# Patient Record
Sex: Male | Born: 1950 | Race: White | Hispanic: No | Smoking: Former smoker
Health system: Southern US, Community
[De-identification: ages and names within clinical notes are randomized; demographics above are authoritative.]

## PROBLEM LIST (undated history)

## (undated) DIAGNOSIS — F32A Depression, unspecified: Secondary | ICD-10-CM

## (undated) DIAGNOSIS — I219 Acute myocardial infarction, unspecified: Secondary | ICD-10-CM

## (undated) DIAGNOSIS — G8929 Other chronic pain: Secondary | ICD-10-CM

## (undated) DIAGNOSIS — I2510 Atherosclerotic heart disease of native coronary artery without angina pectoris: Secondary | ICD-10-CM

## (undated) DIAGNOSIS — Z9889 Other specified postprocedural states: Secondary | ICD-10-CM

## (undated) DIAGNOSIS — M549 Dorsalgia, unspecified: Secondary | ICD-10-CM

## (undated) DIAGNOSIS — F329 Major depressive disorder, single episode, unspecified: Secondary | ICD-10-CM

## (undated) DIAGNOSIS — N400 Enlarged prostate without lower urinary tract symptoms: Secondary | ICD-10-CM

## (undated) DIAGNOSIS — N390 Urinary tract infection, site not specified: Secondary | ICD-10-CM

## (undated) DIAGNOSIS — K219 Gastro-esophageal reflux disease without esophagitis: Secondary | ICD-10-CM

## (undated) DIAGNOSIS — T7840XA Allergy, unspecified, initial encounter: Secondary | ICD-10-CM

## (undated) DIAGNOSIS — I509 Heart failure, unspecified: Secondary | ICD-10-CM

## (undated) DIAGNOSIS — E119 Type 2 diabetes mellitus without complications: Secondary | ICD-10-CM

## (undated) DIAGNOSIS — I639 Cerebral infarction, unspecified: Secondary | ICD-10-CM

## (undated) DIAGNOSIS — I10 Essential (primary) hypertension: Secondary | ICD-10-CM

## (undated) DIAGNOSIS — C4491 Basal cell carcinoma of skin, unspecified: Secondary | ICD-10-CM

## (undated) DIAGNOSIS — Z9689 Presence of other specified functional implants: Secondary | ICD-10-CM

## (undated) DIAGNOSIS — M869 Osteomyelitis, unspecified: Secondary | ICD-10-CM

## (undated) DIAGNOSIS — G4733 Obstructive sleep apnea (adult) (pediatric): Secondary | ICD-10-CM

## (undated) HISTORY — DX: Cerebral infarction, unspecified: I63.9

## (undated) HISTORY — DX: Other specified postprocedural states: Z98.890

## (undated) HISTORY — DX: Basal cell carcinoma of skin, unspecified: C44.91

## (undated) HISTORY — DX: Gastro-esophageal reflux disease without esophagitis: K21.9

## (undated) HISTORY — PX: OTHER SURGICAL HISTORY: SHX169

## (undated) HISTORY — DX: Allergy, unspecified, initial encounter: T78.40XA

## (undated) HISTORY — DX: Presence of other specified functional implants: Z96.89

## (undated) HISTORY — DX: Acute myocardial infarction, unspecified: I21.9

## (undated) SURGERY — INSERTION, CARDIAC PACEMAKER
Anesthesia: General | Laterality: Left

---

## 2000-04-05 ENCOUNTER — Inpatient Hospital Stay (HOSPITAL_COMMUNITY): Admission: EM | Admit: 2000-04-05 | Discharge: 2000-04-07 | Payer: Self-pay | Admitting: Emergency Medicine

## 2000-04-05 ENCOUNTER — Encounter: Payer: Self-pay | Admitting: Emergency Medicine

## 2007-03-17 ENCOUNTER — Inpatient Hospital Stay: Payer: Self-pay | Admitting: Internal Medicine

## 2007-03-17 ENCOUNTER — Other Ambulatory Visit: Payer: Self-pay

## 2007-03-21 ENCOUNTER — Other Ambulatory Visit: Payer: Self-pay

## 2007-05-18 ENCOUNTER — Ambulatory Visit: Payer: Self-pay | Admitting: Sports Medicine

## 2007-05-24 ENCOUNTER — Ambulatory Visit: Payer: Self-pay | Admitting: Sports Medicine

## 2007-06-04 ENCOUNTER — Ambulatory Visit: Payer: Self-pay | Admitting: Gastroenterology

## 2007-06-08 ENCOUNTER — Other Ambulatory Visit: Payer: Self-pay

## 2007-06-08 ENCOUNTER — Ambulatory Visit: Payer: Self-pay | Admitting: Orthopaedic Surgery

## 2007-06-19 ENCOUNTER — Ambulatory Visit: Payer: Self-pay | Admitting: Orthopaedic Surgery

## 2007-09-04 ENCOUNTER — Ambulatory Visit: Payer: Self-pay | Admitting: Orthopaedic Surgery

## 2007-09-11 ENCOUNTER — Ambulatory Visit: Payer: Self-pay | Admitting: Orthopaedic Surgery

## 2008-08-04 ENCOUNTER — Ambulatory Visit: Payer: Self-pay | Admitting: Urology

## 2008-09-08 ENCOUNTER — Ambulatory Visit: Payer: Self-pay | Admitting: Urology

## 2011-08-22 DIAGNOSIS — F1190 Opioid use, unspecified, uncomplicated: Secondary | ICD-10-CM | POA: Insufficient documentation

## 2011-09-29 ENCOUNTER — Ambulatory Visit: Payer: Self-pay | Admitting: Internal Medicine

## 2012-01-18 DIAGNOSIS — M79671 Pain in right foot: Secondary | ICD-10-CM | POA: Insufficient documentation

## 2012-02-13 DIAGNOSIS — N312 Flaccid neuropathic bladder, not elsewhere classified: Secondary | ICD-10-CM | POA: Insufficient documentation

## 2012-02-13 DIAGNOSIS — N401 Enlarged prostate with lower urinary tract symptoms: Secondary | ICD-10-CM | POA: Insufficient documentation

## 2012-02-13 DIAGNOSIS — Z87442 Personal history of urinary calculi: Secondary | ICD-10-CM | POA: Insufficient documentation

## 2012-02-13 DIAGNOSIS — N478 Other disorders of prepuce: Secondary | ICD-10-CM | POA: Insufficient documentation

## 2012-02-13 DIAGNOSIS — N476 Balanoposthitis: Secondary | ICD-10-CM | POA: Insufficient documentation

## 2012-02-13 DIAGNOSIS — N3941 Urge incontinence: Secondary | ICD-10-CM | POA: Insufficient documentation

## 2012-03-16 DIAGNOSIS — N390 Urinary tract infection, site not specified: Secondary | ICD-10-CM | POA: Insufficient documentation

## 2013-03-15 ENCOUNTER — Ambulatory Visit: Payer: Self-pay | Admitting: Medical

## 2013-03-29 ENCOUNTER — Ambulatory Visit: Payer: Self-pay | Admitting: Unknown Physician Specialty

## 2013-04-03 DIAGNOSIS — M25511 Pain in right shoulder: Secondary | ICD-10-CM | POA: Insufficient documentation

## 2013-05-06 ENCOUNTER — Ambulatory Visit: Payer: Self-pay | Admitting: Unknown Physician Specialty

## 2013-05-06 DIAGNOSIS — I10 Essential (primary) hypertension: Secondary | ICD-10-CM

## 2013-05-06 LAB — CBC WITH DIFFERENTIAL/PLATELET
Basophil #: 0.1 10*3/uL (ref 0.0–0.1)
Basophil %: 1.4 %
Eosinophil #: 0.2 10*3/uL (ref 0.0–0.7)
Eosinophil %: 3.5 %
HCT: 40.4 % (ref 40.0–52.0)
HGB: 13.1 g/dL (ref 13.0–18.0)
Lymphocyte #: 2.1 10*3/uL (ref 1.0–3.6)
Lymphocyte %: 35.3 %
MCH: 27.1 pg (ref 26.0–34.0)
MCHC: 32.5 g/dL (ref 32.0–36.0)
MCV: 83 fL (ref 80–100)
Monocyte #: 0.5 x10 3/mm (ref 0.2–1.0)
Monocyte %: 8.3 %
Neutrophil #: 3 10*3/uL (ref 1.4–6.5)
Neutrophil %: 51.5 %
Platelet: 203 10*3/uL (ref 150–440)
RBC: 4.84 10*6/uL (ref 4.40–5.90)
RDW: 15.8 % — ABNORMAL HIGH (ref 11.5–14.5)
WBC: 5.9 10*3/uL (ref 3.8–10.6)

## 2013-05-06 LAB — BASIC METABOLIC PANEL
Anion Gap: 4 — ABNORMAL LOW (ref 7–16)
BUN: 22 mg/dL — ABNORMAL HIGH (ref 7–18)
Calcium, Total: 9.7 mg/dL (ref 8.5–10.1)
EGFR (African American): >60
EGFR (Non-African Amer.): >60
Glucose: 103 mg/dL — ABNORMAL HIGH (ref 65–99)
Osmolality: 283 (ref 275–301)
Potassium: 4.4 mmol/L (ref 3.5–5.1)
Sodium: 140 mmol/L (ref 136–145)

## 2013-05-17 ENCOUNTER — Ambulatory Visit: Payer: Self-pay | Admitting: Unknown Physician Specialty

## 2013-08-21 DIAGNOSIS — M5416 Radiculopathy, lumbar region: Secondary | ICD-10-CM

## 2013-08-21 DIAGNOSIS — G8929 Other chronic pain: Secondary | ICD-10-CM | POA: Insufficient documentation

## 2013-08-29 LAB — BASIC METABOLIC PANEL
Anion Gap: 6 — ABNORMAL LOW (ref 7–16)
BUN: 19 mg/dL — ABNORMAL HIGH (ref 7–18)
CHLORIDE: 106 mmol/L (ref 98–107)
Calcium, Total: 9 mg/dL (ref 8.5–10.1)
Co2: 27 mmol/L (ref 21–32)
Creatinine: 0.83 mg/dL (ref 0.60–1.30)
EGFR (African American): >60
GLUCOSE: 211 mg/dL — AB (ref 65–99)
Osmolality: 286 (ref 275–301)
Potassium: 4.6 mmol/L (ref 3.5–5.1)
Sodium: 139 mmol/L (ref 136–145)

## 2013-08-29 LAB — CBC WITH DIFFERENTIAL/PLATELET
Basophil #: 0.1 10*3/uL (ref 0.0–0.1)
Basophil %: 1.5 %
EOS ABS: 0.2 10*3/uL (ref 0.0–0.7)
EOS PCT: 2.4 %
HCT: 44.2 % (ref 40.0–52.0)
HGB: 14.2 g/dL (ref 13.0–18.0)
LYMPHS PCT: 23.2 %
Lymphocyte #: 1.7 10*3/uL (ref 1.0–3.6)
MCH: 26.4 pg (ref 26.0–34.0)
MCHC: 32.1 g/dL (ref 32.0–36.0)
MCV: 82 fL (ref 80–100)
MONO ABS: 0.4 x10 3/mm (ref 0.2–1.0)
Monocyte %: 5.8 %
NEUTROS ABS: 5 10*3/uL (ref 1.4–6.5)
NEUTROS PCT: 67.1 %
PLATELETS: 218 10*3/uL (ref 150–440)
RBC: 5.37 10*6/uL (ref 4.40–5.90)
RDW: 15.6 % — AB (ref 11.5–14.5)
WBC: 7.4 10*3/uL (ref 3.8–10.6)

## 2013-08-29 LAB — DRUG SCREEN, URINE
AMPHETAMINES, UR SCREEN: NEGATIVE (ref ?–1000)
BENZODIAZEPINE, UR SCRN: NEGATIVE (ref ?–200)
Barbiturates, Ur Screen: NEGATIVE (ref ?–200)
CANNABINOID 50 NG, UR ~~LOC~~: NEGATIVE (ref ?–50)
Cocaine Metabolite,Ur ~~LOC~~: NEGATIVE (ref ?–300)
MDMA (ECSTASY) UR SCREEN: NEGATIVE (ref ?–500)
Methadone, Ur Screen: NEGATIVE (ref ?–300)
Opiate, Ur Screen: NEGATIVE (ref ?–300)
Phencyclidine (PCP) Ur S: NEGATIVE (ref ?–25)
TRICYCLIC, UR SCREEN: NEGATIVE (ref ?–1000)

## 2013-08-29 LAB — URINALYSIS, COMPLETE
Bilirubin,UR: NEGATIVE
Blood: NEGATIVE
GLUCOSE, UR: NEGATIVE mg/dL (ref 0–75)
Leukocyte Esterase: NEGATIVE
Nitrite: NEGATIVE
PROTEIN: NEGATIVE
Ph: 8 (ref 4.5–8.0)
RBC,UR: NONE SEEN /HPF (ref 0–5)
Specific Gravity: 1.009 (ref 1.003–1.030)
Squamous Epithelial: <1
WBC UR: 1 /HPF (ref 0–5)

## 2013-08-29 LAB — AMMONIA: Ammonia, Plasma: 26 mcmol/L (ref 11–32)

## 2013-08-29 LAB — ETHANOL: Ethanol: <3 mg/dL

## 2013-08-29 LAB — TROPONIN I: Troponin-I: <0.02 ng/mL

## 2013-08-30 ENCOUNTER — Observation Stay: Payer: Self-pay | Admitting: Internal Medicine

## 2013-08-30 LAB — BASIC METABOLIC PANEL
Anion Gap: 8 (ref 7–16)
BUN: 14 mg/dL (ref 7–18)
CHLORIDE: 106 mmol/L (ref 98–107)
Calcium, Total: 9.2 mg/dL (ref 8.5–10.1)
Co2: 26 mmol/L (ref 21–32)
Creatinine: 0.76 mg/dL (ref 0.60–1.30)
EGFR (African American): >60
Glucose: 225 mg/dL — ABNORMAL HIGH (ref 65–99)
OSMOLALITY: 287 (ref 275–301)
Potassium: 3.8 mmol/L (ref 3.5–5.1)
Sodium: 140 mmol/L (ref 136–145)

## 2013-08-30 LAB — LIPID PANEL
CHOLESTEROL: 180 mg/dL (ref 0–200)
HDL Cholesterol: 27 mg/dL — ABNORMAL LOW (ref 40–60)
TRIGLYCERIDES: 465 mg/dL — AB (ref 0–200)

## 2013-08-30 LAB — CBC WITH DIFFERENTIAL/PLATELET
BASOS ABS: 0.1 10*3/uL (ref 0.0–0.1)
BASOS PCT: 1.2 %
Eosinophil #: 0.1 10*3/uL (ref 0.0–0.7)
Eosinophil %: 1.4 %
HCT: 43.1 % (ref 40.0–52.0)
HGB: 14 g/dL (ref 13.0–18.0)
LYMPHS ABS: 1.6 10*3/uL (ref 1.0–3.6)
Lymphocyte %: 22 %
MCH: 26.4 pg (ref 26.0–34.0)
MCHC: 32.5 g/dL (ref 32.0–36.0)
MCV: 81 fL (ref 80–100)
MONO ABS: 0.4 x10 3/mm (ref 0.2–1.0)
Monocyte %: 6.1 %
NEUTROS ABS: 5 10*3/uL (ref 1.4–6.5)
NEUTROS PCT: 69.3 %
Platelet: 222 10*3/uL (ref 150–440)
RBC: 5.31 10*6/uL (ref 4.40–5.90)
RDW: 15.7 % — ABNORMAL HIGH (ref 11.5–14.5)
WBC: 7.3 10*3/uL (ref 3.8–10.6)

## 2013-08-30 LAB — TSH: Thyroid Stimulating Horm: 2.36 u[IU]/mL

## 2013-08-30 LAB — SEDIMENTATION RATE: Erythrocyte Sed Rate: 14 mm/hr (ref 0–20)

## 2013-08-31 ENCOUNTER — Ambulatory Visit: Payer: Self-pay | Admitting: Neurology

## 2013-08-31 LAB — CK: CK, TOTAL: 137 U/L

## 2013-09-03 ENCOUNTER — Observation Stay: Payer: Self-pay | Admitting: Internal Medicine

## 2013-09-03 LAB — URINALYSIS, COMPLETE
Bilirubin,UR: NEGATIVE
Blood: NEGATIVE
Glucose,UR: NEGATIVE mg/dL (ref 0–75)
KETONE: NEGATIVE
NITRITE: NEGATIVE
Ph: 5 (ref 4.5–8.0)
Protein: NEGATIVE
RBC,UR: <1 /HPF (ref 0–5)
Specific Gravity: 1.024 (ref 1.003–1.030)
WBC UR: 4 /HPF (ref 0–5)

## 2013-09-03 LAB — COMPREHENSIVE METABOLIC PANEL
ALBUMIN: 3.9 g/dL (ref 3.4–5.0)
ANION GAP: 5 — AB (ref 7–16)
AST: 21 U/L (ref 15–37)
Alkaline Phosphatase: 95 U/L
BUN: 27 mg/dL — ABNORMAL HIGH (ref 7–18)
Bilirubin,Total: 0.4 mg/dL (ref 0.2–1.0)
CHLORIDE: 106 mmol/L (ref 98–107)
CO2: 27 mmol/L (ref 21–32)
CREATININE: 0.86 mg/dL (ref 0.60–1.30)
Calcium, Total: 9.6 mg/dL (ref 8.5–10.1)
EGFR (African American): >60
EGFR (Non-African Amer.): >60
GLUCOSE: 118 mg/dL — AB (ref 65–99)
Osmolality: 282 (ref 275–301)
Potassium: 3.9 mmol/L (ref 3.5–5.1)
SGPT (ALT): 27 U/L (ref 12–78)
Sodium: 138 mmol/L (ref 136–145)
Total Protein: 7.9 g/dL (ref 6.4–8.2)

## 2013-09-03 LAB — CBC
HCT: 42.6 % (ref 40.0–52.0)
HGB: 13.6 g/dL (ref 13.0–18.0)
MCH: 26.1 pg (ref 26.0–34.0)
MCHC: 31.9 g/dL — AB (ref 32.0–36.0)
MCV: 82 fL (ref 80–100)
Platelet: 232 10*3/uL (ref 150–440)
RBC: 5.22 10*6/uL (ref 4.40–5.90)
RDW: 15.8 % — AB (ref 11.5–14.5)
WBC: 6.4 10*3/uL (ref 3.8–10.6)

## 2013-09-03 LAB — CK TOTAL AND CKMB (NOT AT ARMC)
CK, TOTAL: 65 U/L
CK-MB: 1.3 ng/mL (ref 0.5–3.6)

## 2013-09-03 LAB — TROPONIN I

## 2013-09-04 LAB — CBC WITH DIFFERENTIAL/PLATELET
BASOS PCT: 3.2 %
Basophil #: 0.1 10*3/uL (ref 0.0–0.1)
EOS ABS: 0.2 10*3/uL (ref 0.0–0.7)
EOS PCT: 5.8 %
HCT: 40.4 % (ref 40.0–52.0)
HGB: 13.1 g/dL (ref 13.0–18.0)
Lymphocyte #: 2.2 10*3/uL (ref 1.0–3.6)
Lymphocyte %: 51.1 %
MCH: 26.4 pg (ref 26.0–34.0)
MCHC: 32.3 g/dL (ref 32.0–36.0)
MCV: 82 fL (ref 80–100)
MONOS PCT: 8.1 %
Monocyte #: 0.3 x10 3/mm (ref 0.2–1.0)
NEUTROS ABS: 1.3 10*3/uL — AB (ref 1.4–6.5)
NEUTROS PCT: 31.8 %
Platelet: 197 10*3/uL (ref 150–440)
RBC: 4.94 10*6/uL (ref 4.40–5.90)
RDW: 16.2 % — ABNORMAL HIGH (ref 11.5–14.5)
WBC: 4.2 10*3/uL (ref 3.8–10.6)

## 2013-09-04 LAB — BASIC METABOLIC PANEL
Anion Gap: 6 — ABNORMAL LOW (ref 7–16)
BUN: 26 mg/dL — AB (ref 7–18)
CHLORIDE: 110 mmol/L — AB (ref 98–107)
CO2: 27 mmol/L (ref 21–32)
CREATININE: 0.86 mg/dL (ref 0.60–1.30)
Calcium, Total: 8.8 mg/dL (ref 8.5–10.1)
EGFR (Non-African Amer.): >60
GLUCOSE: 131 mg/dL — AB (ref 65–99)
OSMOLALITY: 292 (ref 275–301)
Potassium: 3.7 mmol/L (ref 3.5–5.1)
Sodium: 143 mmol/L (ref 136–145)

## 2013-11-08 DIAGNOSIS — E119 Type 2 diabetes mellitus without complications: Secondary | ICD-10-CM | POA: Insufficient documentation

## 2013-11-08 DIAGNOSIS — E1169 Type 2 diabetes mellitus with other specified complication: Secondary | ICD-10-CM | POA: Insufficient documentation

## 2013-11-25 ENCOUNTER — Emergency Department: Payer: Self-pay | Admitting: Emergency Medicine

## 2013-11-25 LAB — CBC WITH DIFFERENTIAL/PLATELET
Basophil #: 0.1 x10 3/mm 3 (ref 0.0–0.1)
Basophil %: 1.3 %
Eosinophil #: 0.2 x10 3/mm 3 (ref 0.0–0.7)
Eosinophil %: 2.5 %
HCT: 38.1 % — ABNORMAL LOW (ref 40.0–52.0)
HGB: 12.1 g/dL — ABNORMAL LOW (ref 13.0–18.0)
Lymphocyte %: 19 %
Lymphs Abs: 1.8 x10 3/mm 3 (ref 1.0–3.6)
MCH: 26.5 pg (ref 26.0–34.0)
MCHC: 31.8 g/dL — ABNORMAL LOW (ref 32.0–36.0)
MCV: 83 fL (ref 80–100)
Monocyte #: 0.6 x10 3/mm (ref 0.2–1.0)
Monocyte %: 6.7 %
Neutrophil #: 6.5 x10 3/mm 3 (ref 1.4–6.5)
Neutrophil %: 70.5 %
Platelet: 217 x10 3/mm 3 (ref 150–440)
RBC: 4.58 x10 6/mm 3 (ref 4.40–5.90)
RDW: 17.2 % — ABNORMAL HIGH (ref 11.5–14.5)
WBC: 9.2 x10 3/mm 3 (ref 3.8–10.6)

## 2013-11-25 LAB — BASIC METABOLIC PANEL
Anion Gap: 7 (ref 7–16)
BUN: 19 mg/dL — ABNORMAL HIGH (ref 7–18)
Calcium, Total: 8.9 mg/dL (ref 8.5–10.1)
Chloride: 105 mmol/L (ref 98–107)
Co2: 27 mmol/L (ref 21–32)
Creatinine: 1.24 mg/dL (ref 0.60–1.30)
EGFR (African American): >60
EGFR (Non-African Amer.): >60
Glucose: 174 mg/dL — ABNORMAL HIGH (ref 65–99)
Osmolality: 284 (ref 275–301)
Potassium: 4.6 mmol/L (ref 3.5–5.1)
Sodium: 139 mmol/L (ref 136–145)

## 2013-11-25 LAB — TROPONIN I: Troponin-I: <0.02 ng/mL

## 2013-11-26 LAB — URINALYSIS, COMPLETE
BILIRUBIN, UR: NEGATIVE
Blood: NEGATIVE
Glucose,UR: >=500 mg/dL (ref 0–75)
Ketone: NEGATIVE
Leukocyte Esterase: NEGATIVE
NITRITE: POSITIVE
PROTEIN: NEGATIVE
Ph: 5 (ref 4.5–8.0)
RBC,UR: 1 /HPF (ref 0–5)
SPECIFIC GRAVITY: 1.031 (ref 1.003–1.030)
Squamous Epithelial: 2
WBC UR: 6 /HPF (ref 0–5)

## 2013-11-26 LAB — CK: CK, Total: 54 U/L

## 2013-12-09 ENCOUNTER — Inpatient Hospital Stay: Payer: Self-pay | Admitting: Internal Medicine

## 2013-12-09 LAB — COMPREHENSIVE METABOLIC PANEL
ALBUMIN: 2.7 g/dL — AB (ref 3.4–5.0)
ALK PHOS: 115 U/L
ANION GAP: 11 (ref 7–16)
BUN: 40 mg/dL — AB (ref 7–18)
Bilirubin,Total: 0.7 mg/dL (ref 0.2–1.0)
CO2: 21 mmol/L (ref 21–32)
Calcium, Total: 8.6 mg/dL (ref 8.5–10.1)
Chloride: 101 mmol/L (ref 98–107)
Creatinine: 2.1 mg/dL — ABNORMAL HIGH (ref 0.60–1.30)
GFR CALC AF AMER: 38 — AB
GFR CALC NON AF AMER: 33 — AB
Glucose: 133 mg/dL — ABNORMAL HIGH (ref 65–99)
Osmolality: 278 (ref 275–301)
Potassium: 4.9 mmol/L (ref 3.5–5.1)
SGOT(AST): 69 U/L — ABNORMAL HIGH (ref 15–37)
SGPT (ALT): 47 U/L
Sodium: 133 mmol/L — ABNORMAL LOW (ref 136–145)
Total Protein: 7.9 g/dL (ref 6.4–8.2)

## 2013-12-09 LAB — CBC WITH DIFFERENTIAL/PLATELET
BASOS ABS: 0.2 10*3/uL — AB (ref 0.0–0.1)
BASOS PCT: 0.9 %
EOS PCT: 1.4 %
Eosinophil #: 0.3 10*3/uL (ref 0.0–0.7)
HCT: 34.6 % — ABNORMAL LOW (ref 40.0–52.0)
HGB: 10.7 g/dL — AB (ref 13.0–18.0)
LYMPHS ABS: 2.5 10*3/uL (ref 1.0–3.6)
LYMPHS PCT: 12.2 %
MCH: 25.5 pg — AB (ref 26.0–34.0)
MCHC: 31 g/dL — ABNORMAL LOW (ref 32.0–36.0)
MCV: 82 fL (ref 80–100)
Monocyte #: 1.3 x10 3/mm — ABNORMAL HIGH (ref 0.2–1.0)
Monocyte %: 6.3 %
NEUTROS ABS: 16.4 10*3/uL — AB (ref 1.4–6.5)
Neutrophil %: 79.2 %
Platelet: 307 10*3/uL (ref 150–440)
RBC: 4.21 10*6/uL — ABNORMAL LOW (ref 4.40–5.90)
RDW: 17.1 % — ABNORMAL HIGH (ref 11.5–14.5)
WBC: 20.7 10*3/uL — ABNORMAL HIGH (ref 3.8–10.6)

## 2013-12-09 LAB — CK TOTAL AND CKMB (NOT AT ARMC)
CK, TOTAL: 258 U/L
CK-MB: 1.3 ng/mL (ref 0.5–3.6)

## 2013-12-09 LAB — TROPONIN I: Troponin-I: <0.02 ng/mL

## 2013-12-10 LAB — URINALYSIS, COMPLETE
Bilirubin,UR: NEGATIVE
Glucose,UR: >=500 mg/dL (ref 0–75)
KETONE: NEGATIVE
Leukocyte Esterase: NEGATIVE
Nitrite: NEGATIVE
Ph: 5 (ref 4.5–8.0)
Protein: NEGATIVE
RBC,UR: 16 /HPF (ref 0–5)
SQUAMOUS EPITHELIAL: NONE SEEN
Specific Gravity: 1.014 (ref 1.003–1.030)

## 2013-12-10 LAB — BASIC METABOLIC PANEL
Anion Gap: 10 (ref 7–16)
BUN: 34 mg/dL — ABNORMAL HIGH (ref 7–18)
CO2: 26 mmol/L (ref 21–32)
CREATININE: 1.62 mg/dL — AB (ref 0.60–1.30)
Calcium, Total: 8.7 mg/dL (ref 8.5–10.1)
Chloride: 101 mmol/L (ref 98–107)
EGFR (African American): 52 — ABNORMAL LOW
EGFR (Non-African Amer.): 45 — ABNORMAL LOW
GLUCOSE: 146 mg/dL — AB (ref 65–99)
Osmolality: 284 (ref 275–301)
Potassium: 3.9 mmol/L (ref 3.5–5.1)
Sodium: 137 mmol/L (ref 136–145)

## 2013-12-10 LAB — CBC WITH DIFFERENTIAL/PLATELET
Basophil #: 0.1 10*3/uL (ref 0.0–0.1)
Basophil %: 0.6 %
Eosinophil #: 0.2 10*3/uL (ref 0.0–0.7)
Eosinophil %: 1.7 %
HCT: 32.7 % — AB (ref 40.0–52.0)
HGB: 10.5 g/dL — AB (ref 13.0–18.0)
LYMPHS ABS: 1.1 10*3/uL (ref 1.0–3.6)
Lymphocyte %: 10 %
MCH: 26.1 pg (ref 26.0–34.0)
MCHC: 32.1 g/dL (ref 32.0–36.0)
MCV: 81 fL (ref 80–100)
MONO ABS: 0.7 x10 3/mm (ref 0.2–1.0)
Monocyte %: 6.2 %
Neutrophil #: 8.6 10*3/uL — ABNORMAL HIGH (ref 1.4–6.5)
Neutrophil %: 81.5 %
Platelet: 241 10*3/uL (ref 150–440)
RBC: 4.01 10*6/uL — ABNORMAL LOW (ref 4.40–5.90)
RDW: 16.9 % — ABNORMAL HIGH (ref 11.5–14.5)
WBC: 10.6 10*3/uL (ref 3.8–10.6)

## 2013-12-12 LAB — CREATININE, SERUM
Creatinine: 1.06 mg/dL (ref 0.60–1.30)
EGFR (African American): >60

## 2013-12-12 LAB — VANCOMYCIN, TROUGH: VANCOMYCIN, TROUGH: 11 ug/mL (ref 10–20)

## 2013-12-13 LAB — BASIC METABOLIC PANEL
Anion Gap: 8 (ref 7–16)
BUN: 22 mg/dL — ABNORMAL HIGH (ref 7–18)
CO2: 25 mmol/L (ref 21–32)
CREATININE: 1.05 mg/dL (ref 0.60–1.30)
Calcium, Total: 8.5 mg/dL (ref 8.5–10.1)
Chloride: 105 mmol/L (ref 98–107)
EGFR (African American): >60
Glucose: 233 mg/dL — ABNORMAL HIGH (ref 65–99)
Osmolality: 286 (ref 275–301)
Potassium: 4.3 mmol/L (ref 3.5–5.1)
Sodium: 138 mmol/L (ref 136–145)

## 2013-12-13 LAB — CBC WITH DIFFERENTIAL/PLATELET
BASOS PCT: 1 %
Basophil #: 0.1 10*3/uL (ref 0.0–0.1)
EOS PCT: 1.7 %
Eosinophil #: 0.1 10*3/uL (ref 0.0–0.7)
HCT: 31.9 % — AB (ref 40.0–52.0)
HGB: 10.3 g/dL — ABNORMAL LOW (ref 13.0–18.0)
Lymphocyte #: 1.5 10*3/uL (ref 1.0–3.6)
Lymphocyte %: 17.2 %
MCH: 26.2 pg (ref 26.0–34.0)
MCHC: 32.4 g/dL (ref 32.0–36.0)
MCV: 81 fL (ref 80–100)
MONO ABS: 0.5 x10 3/mm (ref 0.2–1.0)
Monocyte %: 6.2 %
NEUTROS PCT: 73.9 %
Neutrophil #: 6.3 10*3/uL (ref 1.4–6.5)
Platelet: 275 10*3/uL (ref 150–440)
RBC: 3.94 10*6/uL — ABNORMAL LOW (ref 4.40–5.90)
RDW: 16.8 % — ABNORMAL HIGH (ref 11.5–14.5)
WBC: 8.5 10*3/uL (ref 3.8–10.6)

## 2013-12-13 LAB — URINE CULTURE

## 2013-12-14 LAB — CULTURE, BLOOD (SINGLE)

## 2013-12-14 LAB — VANCOMYCIN, TROUGH: VANCOMYCIN, TROUGH: 10 ug/mL (ref 10–20)

## 2013-12-15 LAB — CREATININE, SERUM
Creatinine: 1.03 mg/dL (ref 0.60–1.30)
EGFR (African American): >60

## 2013-12-15 LAB — VANCOMYCIN, TROUGH: VANCOMYCIN, TROUGH: 16 ug/mL (ref 10–20)

## 2013-12-15 LAB — WOUND CULTURE

## 2013-12-16 LAB — CBC WITH DIFFERENTIAL/PLATELET
BASOS PCT: 1.1 %
Basophil #: 0.1 10*3/uL (ref 0.0–0.1)
Eosinophil #: 0.2 10*3/uL (ref 0.0–0.7)
Eosinophil %: 3.9 %
HCT: 33.6 % — AB (ref 40.0–52.0)
HGB: 10.5 g/dL — AB (ref 13.0–18.0)
LYMPHS PCT: 26.5 %
Lymphocyte #: 1.7 10*3/uL (ref 1.0–3.6)
MCH: 25.3 pg — AB (ref 26.0–34.0)
MCHC: 31.2 g/dL — ABNORMAL LOW (ref 32.0–36.0)
MCV: 81 fL (ref 80–100)
Monocyte #: 0.3 x10 3/mm (ref 0.2–1.0)
Monocyte %: 5.1 %
NEUTROS ABS: 4 10*3/uL (ref 1.4–6.5)
Neutrophil %: 63.4 %
Platelet: 289 10*3/uL (ref 150–440)
RBC: 4.14 10*6/uL — ABNORMAL LOW (ref 4.40–5.90)
RDW: 16.9 % — ABNORMAL HIGH (ref 11.5–14.5)
WBC: 6.2 10*3/uL (ref 3.8–10.6)

## 2013-12-16 LAB — BASIC METABOLIC PANEL
ANION GAP: 6 — AB (ref 7–16)
BUN: 14 mg/dL (ref 7–18)
CHLORIDE: 101 mmol/L (ref 98–107)
Calcium, Total: 8.8 mg/dL (ref 8.5–10.1)
Co2: 28 mmol/L (ref 21–32)
Creatinine: 0.97 mg/dL (ref 0.60–1.30)
EGFR (African American): >60
Glucose: 236 mg/dL — ABNORMAL HIGH (ref 65–99)
OSMOLALITY: 278 (ref 275–301)
Potassium: 4.3 mmol/L (ref 3.5–5.1)
Sodium: 135 mmol/L — ABNORMAL LOW (ref 136–145)

## 2013-12-16 LAB — WOUND CULTURE

## 2013-12-16 LAB — PATHOLOGY REPORT

## 2013-12-17 LAB — VANCOMYCIN, TROUGH: Vancomycin, Trough: 14 ug/mL (ref 10–20)

## 2013-12-18 LAB — CREATININE, SERUM
CREATININE: 1.01 mg/dL (ref 0.60–1.30)
EGFR (African American): >60

## 2013-12-25 ENCOUNTER — Other Ambulatory Visit: Payer: Self-pay | Admitting: Podiatry

## 2013-12-29 LAB — WOUND CULTURE

## 2014-01-17 ENCOUNTER — Ambulatory Visit: Payer: Self-pay | Admitting: Podiatry

## 2014-01-21 LAB — WOUND CULTURE

## 2014-01-23 LAB — PATHOLOGY REPORT

## 2014-02-06 ENCOUNTER — Emergency Department: Payer: Self-pay | Admitting: Emergency Medicine

## 2014-02-06 LAB — COMPREHENSIVE METABOLIC PANEL
ALBUMIN: 3.4 g/dL (ref 3.4–5.0)
ALK PHOS: 49 U/L
Anion Gap: 6 — ABNORMAL LOW (ref 7–16)
BUN: 31 mg/dL — ABNORMAL HIGH (ref 7–18)
Bilirubin,Total: 0.3 mg/dL (ref 0.2–1.0)
CHLORIDE: 102 mmol/L (ref 98–107)
CREATININE: 1.16 mg/dL (ref 0.60–1.30)
Calcium, Total: 8.6 mg/dL (ref 8.5–10.1)
Co2: 30 mmol/L (ref 21–32)
EGFR (African American): >60
GLUCOSE: 139 mg/dL — AB (ref 65–99)
Osmolality: 284 (ref 275–301)
Potassium: 4.6 mmol/L (ref 3.5–5.1)
SGOT(AST): 19 U/L (ref 15–37)
SGPT (ALT): 15 U/L
Sodium: 138 mmol/L (ref 136–145)
Total Protein: 6.8 g/dL (ref 6.4–8.2)

## 2014-02-06 LAB — CBC
HCT: 35.9 % — ABNORMAL LOW (ref 40.0–52.0)
HGB: 11.6 g/dL — ABNORMAL LOW (ref 13.0–18.0)
MCH: 25.3 pg — AB (ref 26.0–34.0)
MCHC: 32.3 g/dL (ref 32.0–36.0)
MCV: 78 fL — AB (ref 80–100)
PLATELETS: 221 10*3/uL (ref 150–440)
RBC: 4.58 10*6/uL (ref 4.40–5.90)
RDW: 17.9 % — ABNORMAL HIGH (ref 11.5–14.5)
WBC: 6.3 10*3/uL (ref 3.8–10.6)

## 2014-02-06 LAB — URINALYSIS, COMPLETE
BILIRUBIN, UR: NEGATIVE
Bacteria: NONE SEEN
Blood: NEGATIVE
Glucose,UR: NEGATIVE mg/dL (ref 0–75)
KETONE: NEGATIVE
LEUKOCYTE ESTERASE: NEGATIVE
Nitrite: NEGATIVE
Ph: 5 (ref 4.5–8.0)
Protein: NEGATIVE
RBC,UR: NONE SEEN /HPF (ref 0–5)
SPECIFIC GRAVITY: 1.012 (ref 1.003–1.030)
Squamous Epithelial: 1
WBC UR: <1 /HPF (ref 0–5)

## 2014-02-06 LAB — CK TOTAL AND CKMB (NOT AT ARMC)
CK, TOTAL: 40 U/L
CK-MB: 1.6 ng/mL (ref 0.5–3.6)

## 2014-02-06 LAB — TROPONIN I: Troponin-I: <0.02 ng/mL

## 2014-03-02 DIAGNOSIS — G894 Chronic pain syndrome: Secondary | ICD-10-CM | POA: Insufficient documentation

## 2014-05-21 DIAGNOSIS — E139 Other specified diabetes mellitus without complications: Secondary | ICD-10-CM | POA: Diagnosis not present

## 2014-06-03 DIAGNOSIS — I10 Essential (primary) hypertension: Secondary | ICD-10-CM | POA: Diagnosis not present

## 2014-06-03 DIAGNOSIS — I5032 Chronic diastolic (congestive) heart failure: Secondary | ICD-10-CM | POA: Diagnosis not present

## 2014-06-03 DIAGNOSIS — E782 Mixed hyperlipidemia: Secondary | ICD-10-CM | POA: Diagnosis not present

## 2014-06-17 DIAGNOSIS — J309 Allergic rhinitis, unspecified: Secondary | ICD-10-CM | POA: Diagnosis not present

## 2014-06-17 DIAGNOSIS — D485 Neoplasm of uncertain behavior of skin: Secondary | ICD-10-CM | POA: Diagnosis not present

## 2014-06-17 DIAGNOSIS — H6530 Chronic mucoid otitis media, unspecified ear: Secondary | ICD-10-CM | POA: Diagnosis not present

## 2014-06-17 DIAGNOSIS — I10 Essential (primary) hypertension: Secondary | ICD-10-CM | POA: Diagnosis not present

## 2014-06-17 DIAGNOSIS — E1165 Type 2 diabetes mellitus with hyperglycemia: Secondary | ICD-10-CM | POA: Diagnosis not present

## 2014-06-17 DIAGNOSIS — J45909 Unspecified asthma, uncomplicated: Secondary | ICD-10-CM | POA: Diagnosis not present

## 2014-07-01 DIAGNOSIS — H6122 Impacted cerumen, left ear: Secondary | ICD-10-CM | POA: Diagnosis not present

## 2014-07-01 DIAGNOSIS — H60332 Swimmer's ear, left ear: Secondary | ICD-10-CM | POA: Diagnosis not present

## 2014-07-01 DIAGNOSIS — D487 Neoplasm of uncertain behavior of other specified sites: Secondary | ICD-10-CM | POA: Diagnosis not present

## 2014-07-01 DIAGNOSIS — E108 Type 1 diabetes mellitus with unspecified complications: Secondary | ICD-10-CM | POA: Diagnosis not present

## 2014-07-01 DIAGNOSIS — H903 Sensorineural hearing loss, bilateral: Secondary | ICD-10-CM | POA: Diagnosis not present

## 2014-07-22 DIAGNOSIS — L918 Other hypertrophic disorders of the skin: Secondary | ICD-10-CM | POA: Diagnosis not present

## 2014-07-22 DIAGNOSIS — L219 Seborrheic dermatitis, unspecified: Secondary | ICD-10-CM | POA: Diagnosis not present

## 2014-07-22 DIAGNOSIS — D485 Neoplasm of uncertain behavior of skin: Secondary | ICD-10-CM | POA: Diagnosis not present

## 2014-07-22 DIAGNOSIS — D239 Other benign neoplasm of skin, unspecified: Secondary | ICD-10-CM | POA: Diagnosis not present

## 2014-07-22 DIAGNOSIS — C44319 Basal cell carcinoma of skin of other parts of face: Secondary | ICD-10-CM | POA: Diagnosis not present

## 2014-08-05 DIAGNOSIS — B351 Tinea unguium: Secondary | ICD-10-CM | POA: Diagnosis not present

## 2014-08-05 DIAGNOSIS — L851 Acquired keratosis [keratoderma] palmaris et plantaris: Secondary | ICD-10-CM | POA: Diagnosis not present

## 2014-08-05 DIAGNOSIS — E1142 Type 2 diabetes mellitus with diabetic polyneuropathy: Secondary | ICD-10-CM | POA: Diagnosis not present

## 2014-08-21 DIAGNOSIS — C44319 Basal cell carcinoma of skin of other parts of face: Secondary | ICD-10-CM | POA: Diagnosis not present

## 2014-09-06 NOTE — H&P (Signed)
PATIENT NAME:  Victor Wise, Victor Wise MR#:  [id number] DATE OF BIRTH:  12-Jun-1950  DATE OF ADMISSION:  09/03/2013  ADMITTING PHYSICIAN: Dr. Gladstone Lighter  PRIMARY CARE PHYSICIAN: Dr. Clayborn Bigness   PRIMARY PAIN PHYSICIAN:  At [facility] pain clinic  CHIEF COMPLAINT: Weakness and fall.   HISTORY OF PRESENT ILLNESS: Victor Wise is a 64 year old, morbidly obese Caucasian male with past medical history significant for hypertension, insulin-dependent diabetes mellitus, spinal stenosis with chronic low back pain. Has a spinal cord stimulator and follows with the pain clinic at [facility]. Had been recently admitted to the hospital last week, and just got discharged 2 days ago after he was diagnosed with a stroke. He could not get an MRI done because of his spinal cord stimulator, but he was suspected to have a subcortical ischemic stroke, resulting in slurred speech. The patient has chronic lower extremity weakness and right radicular pain all the time from his back issues, and also has neck pain issues. The patient felt like he was discharged too soon, and he thought he was going to rehab, but instead he went home with Home Health 2 days ago. Before the stroke admission, though he was weak, he was able to get around without a walker, and he was discharged with a walker. However, he felt more weak after the stroke, which was uniform in both lower extremities, and was trying to get up yesterday from his bed, and before he could reach the walker, he slid from his bed and laid on the floor for almost 12 hours. He is being admitted for physical therapy re-evaluation and to see if he can qualify to go to a rehab.   PAST MEDICAL HISTORY:  1.  Spinal stenosis, status post spinal cord stimulator.  2.  Chronic pain syndrome.  3.  Hypertension.  4.  Hyperlipidemia.  5.  Insulin-dependent diabetes mellitus.  6.  History of irregular heartbeat.  7.  Congestive heart failure, with diastolic dysfunction.  8.  Asthma. 9.  History of  bradycardia.   PAST SURGICAL HISTORY: 1.  Lower back surgery.  2.  Cholecystectomy.  3.  Spinal cord stimulator placement.  4.  Right knee surgery.  5.  Subacromial decompression of his left shoulder.  6.  Left shoulder arthroscopy.  7.  Tonsillectomy.  8.  Right shoulder rotator cuff surgery.  9.  Hypotestosteronism. 10.  CVA last week.  ALLERGIES TO MEDICATIONS: ASPIRIN, Endopa which caused cardiac arrest; BISULFATE, PENICILLIN, ULTRAM, ADHESIVE TAPE, YELLOW DYE.   CURRENT HOME MEDICATIONS:  1.  Norvasc 10 mg p.o. daily.  2.  Benazepril 40 mg p.o. b.i.d.  3.  Ciprodex 0.3% to 0.1% otic suspension, 1 drop each ear 4 times a day for 5 days.  4.  Plavix 75 mg p.o. daily.  5.  Cymbalta 90 mg p.o. daily.  6.  Flector patch 1.3% topical film, apply to affected area twice a day.  7.  Flonase 50 mcg inhalation nasal spray 2 sprays each nostril once a day.  8.  Gemfibrozil 600 mg p.o. b.i.d.  9.  Glyburide/metformin 5/500 mg 2 tablets twice a day.  10.  Lantus 40 units subcu at bedtime.  11.  Lyrica 200 mg p.o. 3 times a day.  12.  Skelaxin 800 mg 3 times a day as needed for muscle spasms.  13.  Singulair 10 mg p.o. daily.  14.  Naproxen 500 mg p.o. b.i.d.  15.  Oxcarbazepine 300 mg p.o. b.i.d.  16.  OxyContin 20 mg extended  release q. 8 hours.  17.  Protonix 40 mg p.o. daily.   SOCIAL HISTORY: Lives at home by himself. His sister works in [place] and visits him often.  No history of any smoking or alcohol use.   FAMILY HISTORY: Significant for heart disease and strokes in both parents, and sister with a heart problem.   REVIEW OF SYSTEMS:   CONSTITUTIONAL: No fever, fatigue or weakness.  EYES: Positive for blurred vision, and uses bifocal glasses. No glaucoma or cataracts.  ENT: No tinnitus, ear pain, hearing loss, epistaxis or discharge.  RESPIRATORY: No cough, wheeze, hemoptysis or COPD. Positive for asthma.  CARDIOVASCULAR: No chest pain, orthopnea, edema, palpitations or  syncope.  GASTROINTESTINAL: No nausea, vomiting, diarrhea, abdominal pain, hematemesis or melena.  GENITOURINARY: No dysuria, hematuria, renal calculus, frequency, incontinence.  ENDOCRINE: No polyuria, nocturia, thyroid problems, heat or cold intolerance.  HEMATOLOGY: No anemia, easy bruising or bleeding.  SKIN: No acne, rash or lesions.  MUSCULOSKELETAL: Positive for generalized back pain, shoulder pain. No arthritis or gout.  NEUROLOGIC: History of CVA present, bilateral lower extremity weakness, and right arm radicular pain from his rotator cuff surgery present.  PSYCHOLOGICAL: No anxiety, insomnia, depression.   PHYSICAL EXAMINATION: VITAL SIGNS: Temperature 97.8 degrees Fahrenheit, pulse 42, respirations 20, blood pressure 182/60, pulse ox 96% on room air.  GENERAL: A heavily-built, obese male lying in bed, not in any acute distress.  HEENT: Normocephalic, atraumatic. Pupils equal, round, reacting to light. Anicteric sclerae. Extraocular movements intact. Oropharynx clear, without erythema, mass or exudates.  NECK: Supple. No thyromegaly, JVD, or carotid bruits. No lymphadenopathy.  LUNGS: Moving air bilaterally. Decreased bibasilar breath sounds. No wheeze or crackles. No use of accessory muscles for breathing.  CARDIOVASCULAR: S1, S2. Slow rate and regular rhythm. No murmurs, rubs, or gallops.  ABDOMEN: Obese, soft, nontender, nondistended. No hepatosplenomegaly. Normal bowel sounds.  EXTREMITIES: No pedal edema. No clubbing or cyanosis. 2+ dorsalis pedis pulses palpable bilaterally.  SKIN: No acne, rash or lesions.  LYMPHATICS: No cervical or inguinal lymphadenopathy.  NEUROLOGIC: He has a left facial droop, minimal 7th nerve weakness. Otherwise, no other cranial nerve deficits noted. Motor strength is 4/5 left upper extremities, 5/5 right upper extremity, and strength of both lower extremities is 4/5. The patient states he cannot bear weight when he stands up, and gives out on both his  legs. Sensation seems to be intact.   PSYCHOLOGICAL: The patient is awake, alert, oriented x 3.   LAB DATA: WBC 6.4, hemoglobin 13.6, hematocrit 42.6, platelet count 232. Sodium 138, potassium 3.9, chloride 106, bicarb 27, BUN 27, creatinine 0.86, glucose 118, calcium of 9.6.  ALT 27, AST 21, alk phos 95, total bilirubin 0.4, albumin of 3.9. CK 65, CK-MB 1.3. Urinalysis negative for any infection. Troponin less than 0.02. Chest x-ray showing clear lung fields. No acute intracranial abnormality. EKG is normal sinus rhythm; however, it is showing spikes in multiple places because of interference from his spinal stimulator.   ASSESSMENT AND PLAN: A 64 year old male with diabetes, hypertension, obesity, chronic back pain, spinal stimulator for spinal stenosis, who had a recent admission for cerebrovascular accident which slurred speech. Discharged home 2 days ago with home health, comes back after fall and generalized weakness.   1.  Generalized weakness and fall, with chronic lower extremity weakness from spinal stenosis.  Discharged with a walker, could not even get to the walker. Will get a Physical Therapy evaluation and consider rehab. Social worker consult for the same.   2.  Cerebrovascular accident. Slurred speech has improved, still a left facial droop versus seen by neuro last admission. He was addressed to get a cervical myelogram, which can be done as an outpatient. MRI could not be done due to spinal cord stimulator. No new neurological symptoms since discharge. Continue Plavix and statin.   3.  Diabetes mellitus. Continue Lantus sliding scale and glyburide and metformin at home doses.   4.  Chronic back pain with spinal stenosis. Has a spinal cord stimulator. Continue all his home pain medications.   5.  Hypertension. Continue home medications. Avoid beta blockers and calcium channel blockers due to his still bradycardia.   6.  Gastroesophageal reflux disease. Continue Protonix.   CODE  STATUS: FULL CODE.   Time spent on admission is 50 minutes.     ____________________________ Gladstone Lighter, MD rk:mr D: 09/03/2013 18:54:20 ET T: 09/03/2013 19:44:11 ET JOB#: [id number]  cc: Gladstone Lighter, MD, <Dictator> Lavera Guise, MD  Gladstone Lighter MD ELECTRONICALLY SIGNED 10/02/2013 13:46

## 2014-09-06 NOTE — Op Note (Signed)
PATIENT NAME:  Victor Wise, Victor Wise MR#:  [id number] DATE OF BIRTH:  [birth year]  DATE OF PROCEDURE:  05/17/2013  PREOPERATIVE DIAGNOSIS: Torn right rotator cuff with impingement.  POSTOPERATIVE DIAGNOSIS:   Torn right rotator cuff with impingement.  OPERATION: Arthroscopic subacromial decompression right shoulder followed by mini incision cuff repair.   SURGEON: Kathrene Alu., M.D.   ANESTHESIA: General.   HISTORY OF PRESENT ILLNESS:  The patient actually had fallen in the recent past injuring his right shoulder. Subsequent to the injury, he experienced pain that had been persistent. He had some weakness in his right shoulder. Plain films did not reveal any bony lesion but MRI was consistent with a large rotator cuff tear. The patient was brought in for surgery.   PROCEDURE DETAILS: The patient was taken the operating room where satisfactory general anesthesia was achieved. Following general anesthesia, the patient was turned to the lateral decubitus position with the right shoulder up. The right shoulder was prepped and draped in the usual fashion for an arthroscopic procedure. The patient was given 2 grams Kefzol IV prior to the start of the procedure.  The shoulder was suspended with the Acufex shoulder suspension device. Shoulder was maintained in about 25 degrees of abduction and about 10 degrees of forward flexion. We used 10 pounds of traction throughout the procedure.  The scope was introduced through a posterior puncture wound in the glenohumeral joint. The joint was distended with lactated Ringer's. Inspection of the joint revealed the articular surfaces were smooth. The biceps tendon was chronically torn. The labrum was frayed but intact. The patient was noted to have a large rotator cuff tear.   The scope was removed from the glenohumeral space and introduced into the subacromial space. I established a lateral portal. I debrided the thickened bursal tissues overlying the tear. The  patient had a tear that was retracted to within about a centimeter of the glenoid. It involved the supraspinatus and infraspinatus tendons. Next, I went ahead and introduced an angled ArthroCare wand through the lateral portal and debrided the soft tissue from the undersurface of the acromion and from the greater tuberosity in the area of the intended reattachment of the cuff.   I then switched the scope to the lateral portal and brought a 5.5 acromionizer bur into the posterior portal and performed a subacromial decompression without difficulty. I then switched the scope to the posterior portal and brought a large round bur into the lateral portal and lightly decorticated the area of the intended reattachment of the cuff. The cuff was freed up superiorly with an elevator. A grasper was placed on the cuff and, indeed, I could advance the torn cuff to the greater tuberosity.  At this time the arthroscopic instruments were removed from the joint and I went ahead and made a lateral mini incision. Basically, I just extended the lateral portal proximally to the edge of the acromion. I bluntly dissected down to the deltoid and then divided it in line with its fibers. I detached the portion of the anterior deltoid from the acromion. Gelpi retractors were inserted. I went ahead and then repaired the cuff back to the greater tuberosity using a double row repair. Medially, I used 2 Spartan anchors with #2 magnum wire sutures that were placed in horizontal mattress fashion. More laterally I used two 5.5 speed screws that incorporated 2 more horizontally placed #2 magnum wire sutures. Once the sutures were tightened down to the speed screws, I went ahead and tied  the medial row sutures. A good firm repair was achieved. There was a short horizontal posterior extension to the cuff tear, which I was able to repair side to side with #2 magnum wire sutures. Next, the wound was irrigated with GU irrigant. I reattached the  anterior deltoid to the acromion through a drill hole with a #1 Ethibond suture. The deltoid interval was closed with #1 Vicryl sutures and the subcutaneous was closed with 2-0 Vicryl sutures. The skin was closed with skin staples and then the posterior puncture wound was closed with two 3-0 nylon sutures in vertical mattress fashion.  I injected a total of 20 mL of 0.5% Marcaine into the posterior puncture wound and the incision and also into the subacromial space. Betadine was applied to the wounds and then four TENS pads were placed about the shoulder and a sterile dressing applied. A shoulder immobilizer was applied.  The patient was then turned supine as he was transferred to his stretcher bed. He was awakened and taken to the recovery room in satisfactory condition. Blood loss was negligible.   ____________________________ Kathrene Alu., MD hbk:ce D: 05/17/2013 15:03:12 ET T: 05/17/2013 15:18:15 ET JOB#: [id number]  cc: Kathrene Alu., MD, <Dictator> Vilinda Flake, JR MD ELECTRONICALLY SIGNED 06/10/2013 13:01

## 2014-09-06 NOTE — Consult Note (Signed)
Brief Consult Note: Diagnosis: improving left elbow cellulitis.   Patient was seen by consultant.   Consult note dictated.   Recommend further assessment or treatment.   Discussed with Attending MD.   Comments: No clear cut abscess in spite of minimal findings on CT. Pt is improving, full PROM, less erythema. Rec observe on IV abx.  Electronic Signatures: Florene Glen (MD)  (Signed 04-Aug-15 13:47)  Authored: Brief Consult Note   Last Updated: 04-Aug-15 13:47 by Florene Glen (MD)

## 2014-09-06 NOTE — Consult Note (Signed)
Brief Consult Note: Diagnosis: adjustment disorder with depressed mood.   Patient was seen by consultant.   Consult note dictated.   Orders entered.   Comments: PSychiatry: Chronically ill man suffering physically and emotionally made statements about not wanting to live. No actual intent to harm self or wish to die. Added 150mg  wellbutrin XL. Will try to follow up while in hospital.  Electronic Signatures: Gonzella Lex (MD)  (Signed 03-Aug-15 23:57)  Authored: Brief Consult Note   Last Updated: 03-Aug-15 23:57 by Gonzella Lex (MD)

## 2014-09-06 NOTE — H&P (Signed)
PATIENT NAME:  Victor Wise, Victor Wise MR#:  [id number] DATE OF BIRTH:  February 22, 1951  DATE OF ADMISSION:  08/30/2013  PRIMARY CARE PHYSICIAN: Dr. Clayborn Bigness.   REFERRING PHYSICIAN: Dr. Eula Listen.    CHIEF COMPLAINT: Slurred speech.   HISTORY OF PRESENT ILLNESS: This is a 64 year old male with history of morbid obesity, history of hypertension, hyperlipidemia, diabetes mellitus, chronic pain syndrome with a spinal cord stimulator. Presents with complaints of slurred speech. The patient reports symptoms have been going on for the last 24 hours. It started at 8:30 in the evening yesterday evening. Reports it has been intermittent. Reported it was noticed by family members as well. As well, the patient reports he is still feeling slurred to a much lesser degree currently, but he was not noticed currently to have any slurred speech. He denies any other focal deficits. Denies any tingling, any numbness but reports as well he is having unsteady gait, but he denies any extremity weakness. The patient had CT head in the ED which did not show any acute findings. The patient has ASPIRIN allergy, so he was given Plavix. Denies any fever, any chills, any cough, any productive sputum, any chest pain, any shortness of breath. Denies history of any CVA in the past. Denies any facial droop, any altered mental status or loss of consciousness. Hospitalist service was requested to admit the patient for further evaluation.   PAST MEDICAL HISTORY:  1. Chronic pain syndrome.  2. Hypertension.  3. Hyperlipidemia.  4. Diabetes mellitus.  5. Bradycardia with palpitations and atrial bigeminy.  6. Diastolic dysfunction.  7. Asthma.   PAST SURGICAL HISTORY:  1. Back surgery.  2. Cholecystectomy.  3. Spinal cord stimulator.  4. Right knee surgery.  5. Subacromial decompression.  6. Tonsillectomy.  7. Left shoulder arthroscopy.  8. Right rotator cuff surgery.   SOCIAL HISTORY: The patient denies any smoking. Drinks  alcohol occasionally. No illicit drug use. Lives with his sister.   FAMILY HISTORY: Significant for diabetes mellitus and hypertension in his mother.   ALLERGIES:  1. Endopa CAUSED CARDIAC ARREST.  2. ASPIRIN.  3. PENICILLIN.  4. ULTRAM.    HOME MEDICATIONS:  1. OxyContin 20 mg oral 3 times a day.  2. Benazepril 40 mg oral 2 times a day.  3. Lyrica 200 mg 3 times a day.  4. Oxcarbazepine 300 mg oral 2 times a day.  5. Cymbalta 30 mg 3 times a day.  6. Glyburide/metformin 5/500 mg 2 tablets 2 times a day.  7. Humulin-R insulin sliding scale.  8. Lantus 40 units at bedtime.  9. Amlodipine 10 mg oral daily.  10. Zyrtec 10 mg oral daily.  11. Singulair 10 mg oral daily.  12. Skelaxin 400 mg oral 2 times a day as needed.  13. Flonase 50 mcg inhalational 2 sprays nasal daily.  14. Pantoprazole 40 mg oral daily.   REVIEW OF SYSTEMS:  CONSTITUTIONAL: Denies fever, chills, fatigue, weakness, weight gain, weight loss.  EYES: Denies blurry vision, double vision, inflammation, glaucoma.  ENT: Denies tinnitus, ear pain, hearing loss, epistaxis or discharge.  RESPIRATORY: Denies any cough, wheezing, hemoptysis, COPD.   CARDIOVASCULAR: Denies chest pain, edema, palpitation, syncope.  GASTROINTESTINAL: Denies nausea, vomiting, diarrhea, abdominal pain, hematemesis, melena.  GENITOURINARY: Denies dysuria, hematuria, renal colic.  ENDOCRINE: Denies polyuria or polydipsia, heat or cold intolerance.  HEMATOLOGIC: Denies anemia, easy bruising, bleeding diathesis.  INTEGUMENTARY: Denies any acne, rash or skin lesion.  MUSCULOSKELETAL: Denies any swelling, gout, cramps. Reports chronic lower  back pain.  NEUROLOGIC: Denies any history of CVA or TIA in the past. Denies any memory loss, any ataxia, any tremors, any migraine. Reports history of slurred speech and unsteady gait.  PSYCHIATRIC: Reports history of depression. Denies any insomnia.   PHYSICAL EXAMINATION:  VITAL SIGNS: Temperature 97.7,  pulse 87, respiratory rate 18, blood pressure 194/75, saturating 99% on room.  GENERAL: Morbidly obese male, looks comfortable in bed, in no apparent distress.  HEENT: Head atraumatic, normocephalic. Pupils equal, reactive to light. Pink conjunctivae. Anicteric sclerae. Moist oral mucosa.  NECK: Supple. No thyromegaly. No JVD.  CHEST: Good air entry bilaterally. No wheezing, rales, rhonchi.  CARDIOVASCULAR: S1, S2 heard. No rubs, murmurs or gallops.  ABDOMEN: Obese, soft, nontender, nondistended. Bowel sounds present.  EXTREMITIES: No edema. No clubbing. No cyanosis.  PSYCHIATRIC: Anxious male. Awake, alert x 3. Intact judgment and insight.  NEUROLOGIC: Cranial nerves grossly intact. Did not appear to be having an slurred speech. Motor 5 out of 5 in all extremities. No focal deficits. Sensation is symmetrical and intact to light touch. Has normal finger-to-nose touch test. Has normal alternating hand movement. The only thing wrong is has mildly positive Romberg as he had very mild unsteady gait upon closing his eyes upon standing. Has negative pronator drift.    PERTINENT LABORATORIES: Glucose 211, BUN 19, creatinine 0.83, sodium 139, potassium 4.6, chloride 106, CO2 27. Troponin less than 0.02. Urine drug is negative for amphetamines, barbiturates, benzos, cocaine, cannabinoids, opiates, methadone, phencyclidine, tricyclic antidepressant, MDMA.   White blood cells 7.4, hemoglobin 14.2, hematocrit 44.2, platelets 218. Urinalysis negative for leukocyte esterase and nitrite.   CT head: No acute findings. Stable atrophy and mild small vessel disease.   ASSESSMENT AND PLAN:  1. Episode of slurred speech: The patient's physical exam only significant for mild Romberg sign. The patient has ASPIRIN allergy, so he will be given 150 mg of Plavix. He will be admitted for further workup. Will check 2-D echo. Will check carotid Dopplers in the morning. The patient cannot have MRI of the brain due to the fact he  has a spinal cord stimulator. Will consult neurology to see if there is any further imaging needed in his case. As well, will allow for permissive hypertension. Will hold all of his meds. Will keep on p.r.n. hydralazine for systolic blood pressure more than 200.  2. Hypertension: Will allow for permissive hypertension until cerebrovascular accident is ruled out. Will hold his medications. Will keep him on p.r.n. hydralazine.  3. Diabetes mellitus: The patient will be resumed on insulin Lantus at a lower dose and insulin sliding scale.  4. Chronic pain syndrome: He will be continued on OxyContin and his other home medications, including Lyrica, oxcarbazepine, Cymbalta and Skelaxin as needed.  5. History of depression: Continue with Cymbalta.  6. Deep venous thrombosis prophylaxis: Subcutaneous heparin.  7. Gastrointestinal prophylaxis: On proton pump inhibitor.   CODE STATUS: FULL CODE.   TOTAL TIME SPENT ON ADMISSION AND PATIENT CARE: 50 minutes.   ____________________________ Albertine Patricia, MD dse:gb D: 08/30/2013 00:33:50 ET T: 08/30/2013 01:13:28 ET JOB#: [id number]  cc: Albertine Patricia, MD, <Dictator> Rik Wadel Graciela Husbands MD ELECTRONICALLY SIGNED 08/30/2013 23:43

## 2014-09-06 NOTE — Consult Note (Signed)
Brief Consult Note: Diagnosis: hematoma, cellulitis.   Patient was seen by consultant.   Comments: suspect hematoma without true abscess at present.  will need follow up as outpatient and may develop abscess.  Electronic Signatures: Laurene Footman (MD)  (Signed 04-Aug-15 15:03)  Authored: Brief Consult Note   Last Updated: 04-Aug-15 15:03 by Laurene Footman (MD)

## 2014-09-06 NOTE — Consult Note (Signed)
Brief Consult Note: Diagnosis: gangrene left great toe.   Patient was seen by consultant.   Consult note dictated.   Recommend to proceed with surgery or procedure.   Recommend further assessment or treatment.   Orders entered.   Comments: Ordered MRI and vascular consult.  Electronic Signatures: Perry Mount (MD)  (Signed 28-Jul-15 13:22)  Authored: Brief Consult Note   Last Updated: 28-Jul-15 13:22 by Perry Mount (MD)

## 2014-09-06 NOTE — Discharge Summary (Signed)
Dates of Admission and Diagnosis:  Date of Admission 03-Sep-2013   Date of Discharge 05-Sep-2013   Admitting Diagnosis Generalized weakness, fall   Final Diagnosis Generalized weakness Stroke- speech problem- improving. Htn DM Spinal stimulator placed in past.    Chief Complaint/History of Present Illness 64 year old, morbidly obese Caucasian male with past medical history significant for hypertension, insulin-dependent diabetes mellitus, spinal stenosis with chronic low back pain. Has a spinal cord stimulator and follows with the pain clinic at [facility]. Had been recently admitted to the hospital last week, and just got discharged 2 days ago after he was diagnosed with a stroke. He could not get an MRI done because of his spinal cord stimulator, but he was suspected to have a subcortical ischemic stroke, resulting in slurred speech. The patient has chronic lower extremity weakness and right radicular pain all the time from his back issues, and also has neck pain issues. The patient felt like he was discharged too soon, and he thought he was going to rehab, but instead he went home with Home Health 2 days ago. Before the stroke admission, though he was weak, he was able to get around without a walker, and he was discharged with a walker. However, he felt more weak after the stroke, which was uniform in both lower extremities, and was trying to get up yesterday from his bed, and before he could reach the walker, he slid from his bed and laid on the floor for almost 12 hours. He is being admitted for physical therapy re-evaluation and to see if he can qualify to go to a rehab.   Allergies:  Yellow Dye #5: Itching  ASA: Other  METI BISULFATES CAUSE ITCHING (also in Normal Saline): Other  Ultram: Other  Endopa caused cardiac arrest: Unknown  PCN: Other, Itching  Adhesive: Other  Hepatic:  21-Apr-15 16:43   Bilirubin, Total 0.4  Alkaline Phosphatase 95 (45-117 NOTE: New Reference  Range 04/05/13)  SGPT (ALT) 27  SGOT (AST) 21  Total Protein, Serum 7.9  Albumin, Serum 3.9  Routine Chem:  21-Apr-15 16:43   Glucose, Serum  118  BUN  27  Creatinine (comp) 0.86  Sodium, Serum 138  Potassium, Serum 3.9  Chloride, Serum 106  CO2, Serum 27  Calcium (Total), Serum 9.6  Anion Gap  5  Osmolality (calc) 282  eGFR (African American) >60  eGFR (Non-African American) >60 (eGFR values <26m/min/1.73 m2 may be an indication of chronic kidney disease (CKD). Calculated eGFR is useful in patients with stable renal function. The eGFR calculation will not be reliable in acutely ill patients when serum creatinine is changing rapidly. It is not useful in  patients on dialysis. The eGFR calculation may not be applicable to patients at the low and high extremes of body sizes, pregnant women, and vegetarians.)  22-Apr-15 06:44   Glucose, Serum  131  BUN  26  Creatinine (comp) 0.86  Sodium, Serum 143  Potassium, Serum 3.7  Chloride, Serum  110  CO2, Serum 27  Calcium (Total), Serum 8.8  Anion Gap  6  Osmolality (calc) 292  eGFR (African American) >60  eGFR (Non-African American) >60 (eGFR values <621mmin/1.73 m2 may be an indication of chronic kidney disease (CKD). Calculated eGFR is useful in patients with stable renal function. The eGFR calculation will not be reliable in acutely ill patients when serum creatinine is changing rapidly. It is not useful in  patients on dialysis. The eGFR calculation may not be applicable to patients at the low  and high extremes of body sizes, pregnant women, and vegetarians.)  Cardiac:  21-Apr-15 16:43   CK, Total 65 (39-308 NOTE: NEW REFERENCE RANGE  06/17/2013)  CPK-MB, Serum 1.3 (Result(s) reported on 03 Sep 2013 at 05:31PM.)  Troponin I < 0.02 (0.00-0.05 0.05 ng/mL or less: NEGATIVE  Repeat testing in 3-6 hrs  if clinically indicated. >0.05 ng/mL: POTENTIAL  MYOCARDIAL INJURY. Repeat  testing in 3-6 hrs if  clinically  indicated. NOTE: An increase or decrease  of 30% or more on serial  testing suggests a  clinically important change)  Routine Hem:  21-Apr-15 16:43   WBC (CBC) 6.4  RBC (CBC) 5.22  Hemoglobin (CBC) 13.6  Hematocrit (CBC) 42.6  Platelet Count (CBC) 232 (Result(s) reported on 03 Sep 2013 at 05:10PM.)  MCV 82  MCH 26.1  MCHC  31.9  RDW  15.8  22-Apr-15 06:44   WBC (CBC) 4.2  RBC (CBC) 4.94  Hemoglobin (CBC) 13.1  Hematocrit (CBC) 40.4  Platelet Count (CBC) 197  MCV 82  MCH 26.4  MCHC 32.3  RDW  16.2  Neutrophil % 31.8  Lymphocyte % 51.1  Monocyte % 8.1  Eosinophil % 5.8  Basophil % 3.2  Neutrophil #  1.3  Lymphocyte # 2.2  Monocyte # 0.3  Eosinophil # 0.2  Basophil # 0.1 (Result(s) reported on 04 Sep 2013 at [facility].)   Pertinent Past History:  Pertinent Past History 1.  Spinal stenosis, status post spinal cord stimulator.  2.  Chronic pain syndrome.  3.  Hypertension.  4.  Hyperlipidemia.  5.  Insulin-dependent diabetes mellitus.  6.  History of irregular heartbeat.  7.  Congestive heart failure, with diastolic dysfunction.  8.  Asthma. 9.  History of bradycardia.   PAST SURGICAL HISTORY: 1.  Lower back surgery.  2.  Cholecystectomy.  3.  Spinal cord stimulator placement.  4.  Right knee surgery.  5.  Subacromial decompression of his left shoulder.  6.  Left shoulder arthroscopy.  7.  Tonsillectomy.  8.  Right shoulder rotator cuff surgery.  9.  Hypotestosteronism. 10.  CVA last week.   Hospital Course:  Hospital Course *  Generalized weakness and fall, with chronic lower extremity weakness from spinal stenosis.   PT and CSW to help with rehab placement. accepted at rehab.  *  Cerebrovascular accident. Slurred speech has improved, still a left facial droop - neuro ( seen him in last admission) suggested get a cervical myelogram, which can be done as an outpatient. MRI could not be done due to spinal cord stimulator. No new neurological symptoms, Continue  Plavix and statin.   * Bradycardia ( atreal bigemini)- Off all rate limiting meds- appreciated cardiologist- Dr. Nehemiah Massed. no further work ups. avoid beta blockers.  *  Diabetes mellitus. Continue Lantus sliding scale and glyburide and metformin at home doses. Blood sugar under control.  *  Chronic back pain with spinal stenosis. Has a spinal cord stimulator. Continue all his home pain medications.   *  Hypertension. Continue home medications. Avoid beta blockers and calcium channel blockers due to his still bradycardia.   Condition on Discharge Stable   Code Status:  Code Status Full Code   DISCHARGE INSTRUCTIONS HOME MEDS:  Medication Reconciliation: Patient's Home Medications at Discharge:     Medication Instructions  oxcarbazepine 300 mg oral tablet  1 tab(s) orally 2 times a day   benazepril 40 mg oral tablet  1 tab(s) orally 2 times a day   lantus 100 units/ml subcutaneous  solution  40 unit(s) subcutaneous once a day (at bedtime)   lyrica 200 mg oral capsule  1 cap(s) orally 3 times a day   gemfibrozil 600 mg oral tablet  1 tab(s) orally 2 times a day (before meals)   clopidogrel 75 mg oral tablet  1 tab(s) orally once a day   metaxalone 800 mg oral tablet  1 tab(s) orally 3 times a day, As Needed for muscle spasms   flector patch 1.3% topical film, extended release  Apply topically to affected area 2 times a day   fluticasone nasal 50 mcg/inh nasal spray  2 spray(s) nasal once a day   naproxen 500 mg oral delayed release tablet  1 tab(s) orally 2 times a day (with meals)   duloxetine 30 mg oral delayed release capsule  3 cap(s) orally once a day   amlodipine 10 mg oral tablet  1 tab(s) orally once a day   montelukast 10 mg oral tablet  1 tab(s) orally once a day for rhinitis   pantoprazole 40 mg oral delayed release tablet  1 tab(s) orally once a day for reflux   glyburide-metformin 5 mg-500 mg oral tablet  2 tab(s) orally 2 times a day (with meals)   oxycontin 20 mg oral  tablet, extended release  1 tab(s) orally every 8 hours   senna  1 tab(s) orally 2 times a day, As needed, constipation    PRESCRIPTIONS: ELECTRONICALLY SUBMITTED  STOP TAKING THE FOLLOWING MEDICATION(S):    ciprodex 0.3%-0.1% otic suspension: 1 drop(s) to each affected ear 4 times a day for 5 days  Physician's Instructions:  Diet Low Sodium  Carbohydrate Controlled (ADA) Diet   Activity Limitations As tolerated   Return to Work Not Applicable   Time frame for Follow Up Appointment 1-2 weeks  PMD.   Other Comments seen by neuro last admission. He was addressed to get a cervical myelogram, which can be done as an outpatient. MRI could not be done due to spinal cord stimulator. PMD need to arrange it.   Electronic Signatures: Vaughan Basta (MD)  (Signed 23-Apr-15 15:26)  Authored: ADMISSION DATE AND DIAGNOSIS, CHIEF COMPLAINT/HPI, Allergies, PERTINENT LABS, PERTINENT PAST HISTORY, HOSPITAL COURSE, DISCHARGE INSTRUCTIONS HOME MEDS, PATIENT INSTRUCTIONS   Last Updated: 23-Apr-15 15:26 by Vaughan Basta (MD)

## 2014-09-06 NOTE — Op Note (Signed)
PATIENT NAME:  Victor Wise, REALE MR#:  [id number] DATE OF BIRTH:  Dec 26, 1950  DATE OF PROCEDURE:  01/17/2014  PREOPERATIVE DIAGNOSIS: Osteomyelitis, first metatarsal head, right foot.   POSTOPERATIVE DIAGNOSIS:  Osteomyelitis, first metatarsal head, right foot.  PROCEDURE: Resection of first metatarsal head and sesamoid bones from the right foot.   SURGEON: Perry Mount, DPM.   ASSISTANT: None.   HISTORY OF PRESENT ILLNESS: The patient was hospitalized approximately a month ago with abscess cellulitis and gangrene to the right great toe. At that time I performed a hallux valgus amputation at the metatarsophalangeal joint.  He was stable for a while.  He has been taking antibiotics, but gradually the foot reduced healing and he developed drainage and a nonhealing ulceration along with some cellulitis to the region.  Due to his lack of healing and increased redness, I felt like there is a chance for osteomyelitis. This was backed up with an x-ray taken in the office.  It showed some demineralization at the metatarsal head medially.    ANESTHESIA:  Local anesthesia with MAC.   ANESTHESIOLOGIST:  Alvin Critchley, MD   ESTIMATED BLOOD LOSS: Approximately 75 mL.   HEMOSTASIS: Ankle tourniquet 250 mmHg pressure for 10 minutes.   PROCEDURE:  At this time, attention was directed to the dorsum of the right foot at the distal portion of the metatarsal where a previous amputation site was noted.  Incision was made through this region down to bone.  This followed the process of sterile prep and draping. Once this incision was made down to bone. The incision was freed away from the metatarsal head and the metatarsal head was inspected.  A weak demineralized area was noted along the medial dorsal medial plantar aspect of the metatarsal.  Portions of this bone was removed and sent for culture.  The head of the metatarsal was then resected at a point where normal bone was encountered and then this was sent to  pathology for evaluation.  Once this was accomplished, a sharp edges were rounded.  The area was copiously irrigated with sterile saline solution. A Versajet debrider was used to remove any devitalized tissue from the region and the area was irrigated some more following that.  At this point, a combination of gentamicin and vancomycin beads were placed in and around the first metatarsal head and around the bone. These beads were then packed in around the bone and the tourniquet was released and (Dictation Anomaly) <<MIprompt and completeSSING TEXT>> vascularity was seen to return to the digits of the foot.  Bleeding started to occur around the incision margin.  Bleeders were clamped and bovied as necessary.  3-0 Vicryl simple interrupted sutures then placed across the deep and superficial fascial layers.  Layer of Surgicel was placed in the wound at this time also to help with hemostasis. Further beads were placed in more superficially and the wound was then closed with 3-0 nylon simple interrupted horizontal mattress combination. Sterile compressive dressing was placed across the wound consisting of Xeroform gauze, 4 x 4's Kerlix and ABD pads.  The patient appeared to tolerate the procedure and anesthesia well and left the OR for the recovery room with vital signs stable and neurovascular status intact.      ____________________________ Gerrit Heck. Michayla Mcneil, DPM mgt:DT D: 01/17/2014 13:57:39 ET T: 01/17/2014 15:56:51 ET JOB#: [id number]  cc: Gerrit Heck Hoover Grewe, DPM, <Dictator> Perry Mount MD ELECTRONICALLY SIGNED 01/22/2014 12:57

## 2014-09-06 NOTE — Consult Note (Signed)
PATIENT NAME:  Victor Wise, Victor Wise MR#:  [id number] DATE OF BIRTH:  05/05/1951  DATE OF CONSULTATION:  12/10/2013  REFERRING PHYSICIAN:   CONSULTING PHYSICIAN:  Gerrit Heck. Daouda Lonzo, DPM  REASON FOR CONSULTATION: Infection and gangrenous changes to the left great toe.   HISTORY OF PRESENT ILLNESS:  He states he was going into a swimming pool when he may have hit it and then it started to get black after that. He was admitted to the hospital on July 27 for hypertension and falling. He had an infection in his arm at that time and appears to have had some sepsis. He states his arm is feeling better since he has been on antibiotics. Uncertain how long his foot has been acting up like this.   PAST MEDICAL HISTORY:  1.  Spinal stenosis. 2.  Chronic pain syndrome. 3.  Hypertension. 4.  Hyperlipidemia. 5.  Insulin-dependent diabetes.  6.  Congestive heart failure.   7.  History of irregular heartbeat. 8.  Asthma. 9.  History of bradycardia. 10.  Cerebrovascular accident.   PAST SURGICAL HISTORY:  1.  Back surgery. 2.  Cholecystectomy. 3.  Bladder cord stimulator placement. 4.  Right knee surgery.  5.  Subacromial decompression of the left shoulder.  6.  Tonsillectomy.  7.  Rotator cuff repair, right shoulder. 8.  Hypotestosteronism.   SOCIAL HISTORY: Denies any smoking or EtOH. Walks with a walker.   FAMILY HISTORY: Heart disease and stroke in both parents.   HOME MEDICATIONS:  1.  Pantoprazole. 2.  Oxcarbazepine. 3.  Naproxen.  4.  Montelukast.  5.  Metaxalone  6.  Lyrica. 7.  Lantus.  8.  Glyburide. 9.  Gemfibrozil. 10.  Fluticasone. 11.  Duloxetine. 12.  Plavix. 13.  Benazepril. 14.  Amlodipine.    PHYSICAL EXAMINATION:  VITAL SIGNS: At this time, temperature is 98.1, pulse 84, respirations 18, blood pressure 104/69, and pulse oximetry 96. His white count was within normal limits.  LOWER EXTREMITY EXAM:  The patient's left hallux is gangrenous all along the medial border of  the great toe. There is redness with cellulitis along the lateral and proximal portions of the great toe. It extends to the MTP joint. Currently is on antibiotics. His pulses are difficult to palpate and states he has never had any evaluation for circulation.   CLINICAL IMPRESSION: Gangrenous left toe with infection, possible peripheral arterial disease associated with diabetes and diabetic angiopathy.   TREATMENT PLAN: The patient is going to need the left hallux amputated to the MTP joint, if not, possibly a metatarsal resection. Would suggest getting an MRI on this to evaluate whether or not any osteomyelitis is present proximally, but most importantly, get a consult for vascular to take a look and see if there is any need for possible intervention to improve distal blood flow. Consult was written by me at this time frame.  My plan for him is to try to get him set up, likely on Thursday, to amputate the toe and get input from [facility] regarding his circulation.     ____________________________ Gerrit Heck Trajan Grove, DPM mgt:ts D: 12/10/2013 13:17:20 ET T: 12/10/2013 13:30:53 ET JOB#: [id number]  cc: Gerrit Heck. Baeleigh Devincent, DPM, <Dictator> Perry Mount MD ELECTRONICALLY SIGNED 01/22/2014 12:54

## 2014-09-06 NOTE — Consult Note (Signed)
PATIENT NAME:  Victor Wise, Victor Wise MR#:  [id number] DATE OF BIRTH:  [birth year]  DATE OF CONSULTATION:  12/16/2013  CONSULTING PHYSICIAN:  Gonzella Lex, MD  IDENTIFYING INFORMATION AND CHIEF COMPLAINT: This is a 64 year old man with multiple severe chronic medical problems, who also now has been diagnosed with MRSA. He made some comments about not wanting to live anymore.   HISTORY OF PRESENT ILLNESS: Information obtained from the patient and the chart. This is a patient with multiple medical problems including chronic spinal stenosis, chronic pain, insulin-dependent diabetes, COPD, history of stroke. He has had an infection resulting in an amputation during this hospitalization and now has been found to have an MRSA infection. The patient tells me freely that he is feeling terrible. He says he is upset and angry at a lot of people. He starts with his family who evidently have told him that they do not want him to come back to live at home because of his infection, at least that is how he is interpreting it. He also says he is angry at the staff here for trying to put him into a nursing facility. He says that he is tired of people coming in and trying to be do-gooders with him. His mood is feeling upset and irritable. He sleeps poorly, has chronic pain. He tells me that he has had thoughts about wishing that he were dead but absolutely denies any intention to harm himself or wish to actually die. In fact, he goes on about what a survivor he is and how convinced he is that he will be able to take care of himself independently outside of the hospital. Does not report any hallucinations or psychotic symptoms. He is taking Cymbalta but does not feel that has any affect on his mood.   PAST PSYCHIATRIC HISTORY: Reviewing his record in the computer I do not see any specific psychiatric evaluation. He tells me that he has been treated for depression in the past with medication, but he cannot remember what medication has  been used. He says the Cymbalta he takes is for pain strictly and does not help with his mood. Denies psychiatric hospitalization. Denies any history of suicide attempt.   MEDICAL HISTORY: Multiple medical problems including spinal stenosis, chronic pain, diabetes, stroke, COPD, new infection, and status post amputation of a toe.   SOCIAL HISTORY: The patient had been living with his sister and her family. They have apparently told him that they are concerned about him coming home with his infection. He cannot go to live with his daughter, who is his only other supportive relative, because of his infection. He has been offered the opportunity to have a skilled facility found for him but is refusing it because he hated the one that he was in earlier this year.   FAMILY HISTORY: Denies any family history of mental illness.   REVIEW OF SYSTEMS: The patient complains of depressed mood, anger, irritability, pain, fatigue, disappointment. Denies hallucinations. Denies suicidal intent. Denies homicidal intent. No psychotic symptoms.   MENTAL STATUS EXAMINATION:  An overweight, disheveled, chronically ill-appearing gentleman, interviewed in his room. He was awake and alert when I came in. Made eye contact that became more intense when he got angry. Speech was labile, it became more irritable and angry as he warmed-up to his subject. Affect was angry. Mood was stated as being terrible. Thoughts were lucid without any sign of delusional thinking. Denies auditory or visual hallucinations. Says that he did make statements about  not wanting to live anymore, but tells me he has no intention of actually harming himself or wanting to die. Denies homicidal ideation. Denies hallucinations. He is alert and oriented x 4. Short and long-term memory grossly intact. Insight and judgment hard to judge from the degree of emotion he is expressing right now. Seems to be of normal intelligence.   CURRENT MEDICATIONS: Amlodipine 10  mg per day, ceftriaxone 1 gram IV q. 24 hours, duloxetine 60 mg per day, fluticasone nasal spray 2 sprays both nostrils per day, Singulair 10 mg per day, oxycodone 20 mg 3 times a day, pantoprazole 40 mg per day, Lyrica 75 mg b.i.d., (Dictation Anomaly)<< MISSING TEXT>> 1 tablet twice a day, vancomycin injection 1500 mg q. 12 hours, Plavix 75 mg per day, insulin NovoLog 3 units 3 times a day, detemir 35 units at night plus sliding scale.   ALLERGIES: METABISULFITES, (Dictation Anomaly)<< MISSING TEXT>>, ASPIRIN, CRESTOR.   VITAL SIGNS: Currently blood pressure 165/84, temperature 98.2, pulse 55, respirations 18.   LABORATORY RESULTS: Blood sugars continue to be consistently elevated above 200.   ASSESSMENT: This 64 year old gentleman is in chronic pain, has multiple severe medical problems, has had multiple losses including an amputation that will obviously further limit his ambulation. On top of this he has the extreme disappointment of learning that his family is concerned about him wanting to come home. He very much does not want to go into a nursing home or have his independence curtailed and he is angry about it. At this point, I do not think that there is a significant risk of his actually trying to do anything to kill himself. I think this is mostly anger and frustration, although I am sure there is underlying some degree of depression. No evidence of psychosis. In the interview today the patient became increasingly agitated while talking with me.  He never specifically told me to get out of his room, but multiple times he talked about how he wished all the do-gooders would leave his room and leave him alone. He may not want to have followup discussion of this topic. I will try to follow up as best I can while he is in the hospital. I did start Wellbutrin 150 mg extended-release once a day as an extra treatment for his depression that should be tolerable.   DIAGNOSIS, PRINCIPAL AND PRIMARY:  AXIS  I: Adjustment disorder with depressed mood.   SECONDARY DIAGNOSES:  AXIS I: Rule out major depression.  AXIS II: Deferred.  AXIS III: Chronic pain, methicillin-resistant Staphylococcus aureus, status post amputation, diabetes, chronic obstructive pulmonary disease, history of stroke.  AXIS IV: Severe, from everything noted above.  AXIS V: Functioning at time of evaluation: 93.    ____________________________ Gonzella Lex, MD jtc:lt D: 12/17/2013 00:08:08 ET T: 12/17/2013 08:03:40 ET JOB#: [id number]  cc: Gonzella Lex, MD, <Dictator>   Gonzella Lex MD ELECTRONICALLY SIGNED 01/09/2014 22:42

## 2014-09-06 NOTE — Consult Note (Signed)
Referring Physician:  Albertine Patricia :   Reason for Consult: Admit Date: 29-Aug-2013  Chief Complaint: weakness  Reason for Consult: weakness   History of Present Illness: History of Present Illness:   64 yo RHD M presents to [facility] secondary to diffuse weakness and slurred speech.  He reports that he has had problems walking over the past 3 months that have gradually worsened.  He denies any fluctuations in strength.  He denies diplopia, dysphagia and dyspnea.  His speech has been abnormal for 3 days and steady.    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  Back Pain:   HTN:   DM:   AAA - Abdominal Aortic Aneurysm Repair:   Cholecystectomy:   Back Surgery:   Shoulder Surgery - Left:   Knee Surgery - Right:   Past Medical/ Surgical Hx:  Past Medical History as above   Past Surgical History as above   Home Medications: Medication Instructions Last Modified Date/Time  glyBURIDE-metformin 5 mg-500 mg oral tablet 2 tab(s) orally 2 times a day  16-Apr-15 20:34  Skelaxin 800 mg oral tablet 0.5 tab(s) orally 2 times a day, As Needed 16-Apr-15 20:34  amLODIPine 10 mg oral tablet 1 tab(s) orally once a day (in the morning) 16-Apr-15 20:34  benazepril 40 mg oral tablet 1 tab(s) orally 2 times a day 16-Apr-15 20:34  Cymbalta 30 mg oral delayed release capsule 3 cap(s) orally once a day (in the morning) 16-Apr-15 20:34  OXcarbazepine 300 mg oral tablet 1 tab(s) orally 2 times a day 16-Apr-15 20:34  Flonase 50 mcg/inh nasal spray 2 spray(s) nasal once a day 16-Apr-15 20:34  Singulair 10 mg oral tablet 1  tab(s) orally once a day (in the morning) 16-Apr-15 20:34  pantoprazole 40 mg oral delayed release tablet 1 tab(s) orally once a day (in the morning) 16-Apr-15 20:34  Lantus 100 units/mL subcutaneous solution 40 unit(s) subcutaneous once a day (at bedtime) 16-Apr-15 20:34  HumuLIN R human recombinant 100 units/mL injectable solution  subcutaneous per sliding scale 16-Apr-15 20:34  Lyrica 200 mg oral capsule 1 cap(s) orally 3 times a day 16-Apr-15 20:34  OxyCONTIN 20 mg oral tablet, extended release  orally 3 times a day 16-Apr-15 20:32  ZyrTEC 10 mg oral tablet 1 tab(s) orally once a day 16-Apr-15 20:34   Allergies:  Yellow Dye #5: Itching  ASA: Other  METI BISULFATES CAUSE ITCHING (also in Normal Saline): Other  Ultram: Other  Endopa caused cardiac arrest: Unknown  PCN: Other, Itching  Adhesive: Other  Allergies:  Allergies as above   Social/Family History: Employment Status: disabled  Lives With: other relative  Living Arrangements: house  Social History: no tob, no EtOH, no illicits  Family History: no family hx of stroke or CAD   Vital Signs: **Vital Signs.:   17-Apr-15 11:48  Vital Signs Type Routine  Temperature Temperature (F) 98.1  Celsius 36.7  Pulse Pulse 81  Respirations Respirations 18  Systolic BP Systolic BP 798  Diastolic BP (mmHg) Diastolic BP (mmHg) 75  Mean BP 107  Pulse  Ox % Pulse Ox % 95  Pulse Ox Activity Level  At rest  Oxygen Delivery Room Air/ 21 %   Physical Exam: General: morbidly obese, poorly kept, NAD  HEENT: normocephalic, sclera nonicteric, oropharynx clear  Neck: supple, no JVD, no bruits  Chest: CTA B, no wheezing, good movement  Cardiac: RRR, no murmurs, no edema, 2+ pulses  Extremities: no C/C/E, FROM   Neurologic Exam: Mental Status: alert and oriented x 3, normal speech and language, follows complex commands  Cranial Nerves: PERRLA, EOMI, nl VF, face symmetric, tongue midline, shoulder shrug equal  Motor Exam: 5-/5 B, nl tone,  no atrophy, no fasciculations  Deep Tendon Reflexes: 3+/4 B, ???Babinski vs. withdrawal, R hoffmann  Sensory Exam: pinprick, temperature, and vibration intact B  Coordination: FTN and HTS WNL   Lab Results: LabObservation:  17-Apr-15 10:04   OBSERVATION Reason for Test  Routine Chem:  16-Apr-15 22:47   Ethanol, S. < 3  Ethanol % (comp) < 0.003 (Result(s) reported on 29 Aug 2013 at 11:14PM.)  Ammonia, Plasma 26 (Result(s) reported on 29 Aug 2013 at 11:19PM.)  17-Apr-15 04:39   Cholesterol, Serum 180  Triglycerides, Serum  465  HDL (INHOUSE)  27  VLDL Cholesterol Calculated SEE COMMENT  LDL Cholesterol Calculated SEE COMMENT  Result Comment VLDL/LDL - Unable to report VLDL and LDL due to a  - Triglyceride value that is 400 mg/dL or   - greater.  Result(s) reported on 30 Aug 2013 at 05:32AM.  Glucose, Serum  225  BUN 14  Creatinine (comp) 0.76  Sodium, Serum 140  Potassium, Serum 3.8  Chloride, Serum 106  CO2, Serum 26  Calcium (Total), Serum 9.2  Anion Gap 8  Osmolality (calc) 287  eGFR (African American) >60  eGFR (Non-African American) >60 (eGFR values <66m/min/1.73 m2 may be an indication of chronic kidney disease (CKD). Calculated eGFR is useful in patients with stable renal function. The eGFR calculation will not be reliable in acutely ill patients when serum creatinine is changing rapidly. It is not useful in  patients on dialysis. The eGFR calculation may not be applicable to patients at the low and high extremes of body sizes, pregnant women, and vegetarians.)  Urine Drugs:  [id number]  Tricyclic Antidepressant, Ur Qual (comp) NEGATIVE (Result(s) reported on 29 Aug 2013 at 11:18PM.)  Amphetamines, Urine Qual. NEGATIVE  MDMA, Urine Qual. NEGATIVE  Cocaine Metabolite, Urine Qual. NEGATIVE  Opiate, Urine qual NEGATIVE  Phencyclidine, Urine Qual. NEGATIVE  Cannabinoid, Urine Qual. NEGATIVE  Barbiturates, Urine Qual. NEGATIVE  Benzodiazepine, Urine Qual.  NEGATIVE (----------------- The URINE DRUG SCREEN provides only a preliminary, unconfirmed analytical test result and should not be used for non-medical  purposes.  Clinical consideration and professional judgment should be  applied to any positive drug screen result due to possible interfering substances.  A more specific alternate chemical method must be used in order to obtain a confirmed analytical result.  Gas chromatography/mass spectrometry (GC/MS) is the preferred confirmatory method.)  Methadone, Urine Qual. NEGATIVE  Cardiac:  16-Apr-15 19:15   Troponin I < 0.02 (0.00-0.05 0.05 ng/mL or less: NEGATIVE  Repeat testing in 3-6 hrs  if clinically indicated. >0.05 ng/mL: POTENTIAL  MYOCARDIAL INJURY. Repeat  testing in 3-6 hrs if  clinically indicated. NOTE: An increase or decrease  of 30% or more on serial  testing suggests a  clinically important change)  Routine UA:  16-Apr-15 19:18   Color (UA) Yellow  Clarity (UA) Hazy  Glucose (UA) Negative  Bilirubin (UA) Negative  Ketones (UA) Trace  Specific Gravity (UA) 1.009  Blood (UA) Negative  pH (UA) 8.0  Protein (UA) Negative  Nitrite (UA) Negative  Leukocyte Esterase (UA) Negative (Result(s) reported on 29 Aug 2013 at 10:32PM.)  RBC (UA) NONE SEEN  WBC (UA) 1 /HPF  Bacteria (UA) 1+  Epithelial Cells (UA) <1 /HPF (Result(s) reported on 29 Aug 2013 at 10:32PM.)  Routine Hem:  17-Apr-15 04:39   WBC (CBC) 7.3  RBC (CBC) 5.31  Hemoglobin (CBC) 14.0  Hematocrit (CBC) 43.1  Platelet Count (CBC) 222  MCV 81  MCH 26.4  MCHC 32.5  RDW  15.7  Neutrophil % 69.3  Lymphocyte % 22.0  Monocyte % 6.1  Eosinophil % 1.4  Basophil % 1.2  Neutrophil # 5.0  Lymphocyte # 1.6  Monocyte # 0.4  Eosinophil # 0.1  Basophil # 0.1 (Result(s) reported on 30 Aug 2013 at 05:32AM.)   Radiology Results: [place]:    17-Apr-15 10:58, US Carotid Doppler Bilateral  US Carotid Doppler Bilateral   REASON FOR EXAM:    slurred  speech  COMMENTS:       PROCEDURE: [place]  - US CAROTID DOPPLER BILATERAL  - Aug 30 2013 10:58AM     CLINICAL DATA:  Slurred speech    EXAM:  BILATERAL CAROTID DUPLEX ULTRASOUND    TECHNIQUE:  Pearline Cables scale imaging, color Dopplerand duplex ultrasound were  performed of bilateral carotid and vertebral arteries in the neck.    COMPARISON:  None.  FINDINGS:  Criteria: Quantification of carotid stenosis is based on velocity  parameters that correlate the residual internal carotid diameter  with NASCET-based stenosis levels, using the diameter of the distal  internal carotid lumen as the denominator for stenosis measurement.    The following velocity measurements were obtained:    RIGHT    ICA:  43/8 cm/sec    CCA:  94/7 cm/sec    SYSTOLIC ICA/CCA RATIO:  0.5  DIASTOLIC ICA/CCA RATIO:  0.96    ECA:  71 cm/sec    LEFT    ICA:  61/15 cm/sec    CCA:  28/36 cm/sec    SYSTOLIC ICA/CCA RATIO:  0.8    DIASTOLIC ICA/CCA RATIO:  1.4    ECA:  97 cm/sec  RIGHT CAROTID ARTERY: The grayscale images show no significant  plaque formation. The waveforms, velocities and flow velocity ratios  show no evidence of focal hemodynamically significant stenosis.    RIGHT VERTEBRAL ARTERY:  Antegrade in nature.    LEFT CAROTID ARTERY: Mildly tortuous. No significant plaque  formation is noted. The waveforms, velocities and flow velocity  ratios show no evidence of focal hemodynamically significant  stenosis.    LEFT VERTEBRAL ARTERY:  Antegrade in nature.     IMPRESSION:  No evidence of focal hemodynamically significant stenosis.  Electronically Signed    By: Inez Catalina M.D.    On: 08/30/2013 11:05         Verified By: Everlene Farrier, M.D.,  CT:    16-Apr-15 22:45, CT Head Without Contrast  CT Head Without Contrast   REASON FOR EXAM:    slurred speech  COMMENTS:       PROCEDURE: CT  - CT HEAD WITHOUT CONTRAST  - Aug 29 2013 10:45PM     CLINICAL DATA:  Weakness, slurred speech  and dizziness.    EXAM:  CT HEAD WITHOUT CONTRAST    TECHNIQUE:  Contiguous axial imageswere obtained from the base of the skull  through the vertex without intravenous contrast.    COMPARISON:  CT HEAD W/O CM dated 03/29/2007; CT HEAD W/O CM dated  03/22/2007; CT HEAD W/O CM dated 03/17/2007    FINDINGS:  Stable cortical atrophy and mildperiventricular small vessel  ischemic changes. The brain demonstrates no evidence of hemorrhage,  infarction, edema, mass effect, extra-axial fluid collection,  hydrocephalus or mass lesion. The skull is unremarkable.     IMPRESSION:  No acute findings.  Stable atrophy and mild small vessel disease.      Electronically Signed    By: Aletta Edouard M.D.    On: 08/29/2013 22:46     Verified By: Azzie Roup, M.D.,   Radiology Impression: Radiology Impression: CT of head personally reviewed by me and normal   Impression/Recommendations: Recommendations:   prior notes reviewed by me reviewed by me    Dysarthria-  no clear etiology but could be side effect from too many pain medications, encephalopathy of some other cause, motor neuron disease or myasthenia gravis;  this is not apparent on my exam Possible spinal stenosis-  pt has upper motor findings which could be spinal stenosis vs. myelopathy L5 radiculopathy-  stable check ESR, acetylcholine ab, ANA, TSH and B12 recommend cervical myelogram would send CSF during myelogram for IGG, oligoclonal bands, protein/glucose PT/OT evaluations will follow  Electronic Signatures: Jamison Neighbor (MD)  (Signed 17-Apr-15 13:22)  Authored: REFERRING PHYSICIAN, Consult, History of Present Illness, Review of Systems, PAST MEDICAL/SURGICAL HISTORY, HOME MEDICATIONS, ALLERGIES, Social/Family History, NURSING VITAL SIGNS, Physical Exam-, LAB RESULTS, RADIOLOGY RESULTS, Recommendations   Last Updated: 17-Apr-15 13:22 by Jamison Neighbor (MD)

## 2014-09-06 NOTE — Consult Note (Signed)
General Aspect 64 year old male with history of morbid obesity, hypertension, hyperlipidemia, sleep apnea treated with CPAP, spinal stimulator for chronic back issues as well as recent CVA treated at [facility] with discharge to home.  Patient had leg weakness, was  unable to get to his walker, slipped and fell to the floor and was unable to get up for 12 hours.  He is now pending admission to to rehabilitation for physical therapy.  Cardiology has been asked to consult for bradycardia.  Patient does have known palpitations and atrial bigeminy.  Even though the flow sheet show a heart rate of 30,  review of telemetry for the time that he has been here, shows a heart rate in the 60s with atrial bigeminy.  Patient currently is not on any chronotropic type drugs and these should be avoided in the future. Patient denies any dizziness.  No chest pain or shortness of breath. he currently has no complaints. Troponin negative.  EKG difficult to interpret due to electrical interference from spinal stimulator.   Physical Exam:  GEN obese, disheveled   HEENT pink conjunctivae, hearing intact to voice, moist oral mucosa, poor dentition   RESP diminished breath sounds bilaterally.   CARD Irregular rate and rhythm  Normal, S1, S2  atrial bigeminy which is an old finding.   ABD denies tenderness  soft   EXTR negative edema   SKIN normal to palpation, skin turgor good   NEURO minimal slurred speech with recent stroke   PSYCH poor insight, lethargic   Review of Systems:  Subjective/Chief Complaint No complaints voiced.   General: Weakness   Musculoskeletal: leg weakness   Neurologic: mild slurred speech   Review of Systems: All other systems were reviewed and found to be negative   Medications/Allergies Reviewed Medications/Allergies reviewed   Family & Social History:  Family and Social History:  Family History Non-Contributory   Social History negative tobacco, negative ETOH   Place of Living  Home   Lab Results: Cardiology:  22-Apr-15 05:07   Ventricular Rate 61  Atrial Rate 61  QRS Duration 98  QT 440  QTc 442  P Axis -17  R Axis -66  T Axis 55  ECG interpretation Undetermined rhythm Possible wandering atrial pacemaker Abnormal ECG in a pattern of bigeminy When compared with ECG of 31-Aug-2013 10:27, Poor data quality in current ECG precludes serial comparison Confirmed by MORAYATI, SAM ([id number]) on 09/04/2013 12:52:36 PM  Overreader: Hipolito Bayley  Routine Chem:  22-Apr-15 06:44   Glucose, Serum  131  BUN  26  Creatinine (comp) 0.86  Sodium, Serum 143  Potassium, Serum 3.7  Chloride, Serum  110  CO2, Serum 27  Calcium (Total), Serum 8.8  Anion Gap  6  Osmolality (calc) 292  eGFR (African American) >60  eGFR (Non-African American) >60 (eGFR values <29m/min/1.73 m2 may be an indication of chronic kidney disease (CKD). Calculated eGFR is useful in patients with stable renal function. The eGFR calculation will not be reliable in acutely ill patients when serum creatinine is changing rapidly. It is not useful in  patients on dialysis. The eGFR calculation may not be applicable to patients at the low and high extremes of body sizes, pregnant women, and vegetarians.)  Routine Hem:  22-Apr-15 06:44   WBC (CBC) 4.2  RBC (CBC) 4.94  Hemoglobin (CBC) 13.1  Hematocrit (CBC) 40.4  Platelet Count (CBC) 197  MCV 82  MCH 26.4  MCHC 32.3  RDW  16.2  Neutrophil % 31.8  Lymphocyte %  51.1  Monocyte % 8.1  Eosinophil % 5.8  Basophil % 3.2  Neutrophil #  1.3  Lymphocyte # 2.2  Monocyte # 0.3  Eosinophil # 0.2  Basophil # 0.1 (Result(s) reported on 04 Sep 2013 at [facility].)   Radiology Results: XRay:    21-Apr-15 16:25, Chest Portable Single View  Chest Portable Single View   REASON FOR EXAM:    Chest pain  COMMENTS:       PROCEDURE: DXR - DXR PORTABLE CHEST SINGLE VIEW  - Sep 03 2013  4:25PM     CLINICAL DATA:  Weakness and chest pain    EXAM:  PORTABLE  CHEST - 1 VIEW    COMPARISON:  None.    FINDINGS:  Cardiac shadow isat the upper limits of normal in size. The lungs  are clear bilaterally. No acute bony abnormality is seen.   IMPRESSION:  No acute abnormality noted.      Electronically Signed    By: Inez Catalina M.D.    On: 09/03/2013 16:27         Verified By: Everlene Farrier, M.D.,    Yellow Dye #5: Itching  ASA: Other  METI BISULFATES CAUSE ITCHING (also in Normal Saline): Other  Ultram: Other  Endopa caused cardiac arrest: Unknown  PCN: Other, Itching  Adhesive: Other  Vital Signs/Nurse's Notes: **Vital Signs.:   22-Apr-15 08:15  Vital Signs Type Pre Medication  Temperature Temperature (F) 97.7  Celsius 36.5  Temperature Source oral  Pulse Pulse 72  Respirations Respirations 18  Systolic BP Systolic BP 072  Diastolic BP (mmHg) Diastolic BP (mmHg) 75  Mean BP 106  Pulse Ox % Pulse Ox % 92  Pulse Ox Activity Level  At rest  Oxygen Delivery Room Air/ 21 %    13:11  Vital Signs Type Routine  Temperature Temperature (F) 98.6  Celsius 37  Temperature Source oral  Respirations Respirations 18  Systolic BP Systolic BP 257  Diastolic BP (mmHg) Diastolic BP (mmHg) 55  Mean BP 85  Pulse Ox % Pulse Ox % 93  Pulse Ox Activity Level  At rest  Oxygen Delivery Room Air/ 21 %    Impression 64 year old male with morbid obesity, hypertension, hyperlipidemia, diabetes mellitus, palpitations with atrial bigeminy, with recent stroke, leg weakness and fall pending placement in rehabilitation.   Plan 1.  Patient does have known atrial bigeminy which the telemetry monitor does reflect. EKGs are hard to interpret due to the electrical interference from the spinal stimulator.  However the rate has been in the low 60s and no significant bradycardia has been picked up by telemetry.  Automated blood pressure cuffs are often inaccurate for the heart rate when the heart rate is irregular. No further cardiac evaluation is needed.   Continue on telemetry.  Continue to avoid chronotropic drugs .  Patient was in collaboration with Dr. Nehemiah Massed.   Electronic Signatures: Roderic Palau (NP)  (Signed 22-Apr-15 14:02)  Authored: General Aspect/Present Illness, History and Physical Exam, Review of System, Family & Social History, Labs, Radiology, Allergies, Vital Signs/Nurse's Notes, Impression/Plan   Last Updated: 22-Apr-15 14:02 by Roderic Palau (NP)

## 2014-09-06 NOTE — Op Note (Signed)
PATIENT NAME:  Victor Wise, WARES MR#:  [id number] DATE OF BIRTH:  1950/10/03  DATE OF PROCEDURE:  12/12/2013  PREOPERATIVE DIAGNOSIS: Gangrene with infection, right hallux.    POSTOPERATIVE DIAGNOSIS:  Gangrene with infection, right hallux.  PROCEDURE: Amputation of right hallux, metatarsophalangeal joint level, right foot.   SURGEON: Perry Mount, DPM.   ASSISTANT: None.   HISTORY OF PRESENT ILLNESS: The patient has had a wound on the toe that eventually became gangrenous and was admitted to the hospital this past week oddly enough for an arm infection, then I discovered that the gangrenous toe. I was consulted at that timeframe and felt like the toe needed to be removed because of the degree of damage and gangrene to the digit. The patient is also scheduled to have an angioplasty while he is in the hospital.   ANESTHESIA: MAC with local anesthesia.   ESTIMATED BLOOD LOSS: 70 mL.   HEMOSTASIS: Ankle tourniquet 250 mmHg for 9 minutes.   OPERATIVE REPORT: The patient was brought to the OR and placed on the OR table in the supine position. At this point, after sedation was achieved and local anesthesia was achieved, the patient was prepped and draped in the usual sterile manner. Attention was then directed to the right great toe. Two semielliptical incisions were made round the good tissue of the great toe.  These were then carried down to bone and the great toe was disarticulated and removed at the metatarsophalangeal joint level. A couple of small areas of pus were identified. These were debrided and cleaned with a VERSAJET set at level 4, and the area was inspected and good healthy tissue seen at this juncture. At this point, the tourniquet was released and prompt re-vascularity was seen to return to the right foot.  A couple of bleeders were sutured and small bleeders were cauterized. The area was copiously irrigated with normal saline and then the skin was then closed with 3-0 nylon deep  simple interrupted sutures. The patient was then dressed with sterile compressive dressing consisting of Xeroform gauze, 4 x 4's, Kling, Kerlix, and ABD pad. An Ace wrap was placed on the foot and leg lastly. The patient tolerated the procedure and anesthesia well, and left the OR for the recovery room.  Vital signs stable.  Neurovascular status intact.      ____________________________ Gerrit Heck. Jolynn Bajorek, DPM mgt:ts D: 12/12/2013 15:27:31 ET T: 12/12/2013 16:00:47 ET JOB#: [id number]  cc: Gerrit Heck Dreon Pineda, DPM, <Dictator> Perry Mount MD ELECTRONICALLY SIGNED 01/22/2014 12:54

## 2014-09-06 NOTE — Discharge Summary (Signed)
PATIENT NAME:  Victor Wise, SHAKE MR#:  [id number] DATE OF BIRTH:  Oct 17, 1950  DATE OF ADMISSION:  12/09/2013 DATE OF DISCHARGE:  12/18/2013  ADMITTING DIAGNOSES: Hypotension, fall.   DISCHARGE DIAGNOSES:  1. Hypotension due to clinical sepsis, due to gangrene of the right first toe, as well as left elbow cellulitis with minimal abscess seen by surgery, as well as orthopedics.  2. Peripheral vascular disease status post per percutaneous transluminal angioplasty of the tibial peroneal trunk and posterior tibial arteries, as well as right mid to distal superficial femoral artery and popliteal arteries, as well as right superficial femoral artery and popliteal artery dissection with a self-expanding repair with a self-expanding stent.  3. Depression.  4. Generalized deconditioning and debility.  5. Sleep apnea.  6. Diabetes.  7. Spinal stenosis status post spinal cord stimulator.  8. Chronic pain syndrome.  9. Hypertension.  10. Hyperlipidemia. 11. History of irregular heart beat.  12. History of diastolic congestive heart failure, chronic in nature.  13. Asthma.  14. History of bradycardia.  15. History of previous cerebrovascular accident.  16. Status post low back surgery.  17. Status post cholecystectomy.  18. Status post right knee surgery.  19. Status post subacromial decompression of his left shoulder.  20. Status post left shoulder arthroscopy.  21. Status post tonsillectomy.   22. Status post right shoulder rotator cuff surgery.  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   PERTINENT LABORATORIES AND EVALUATIONS: Admitting glucose 133, BUN 40, creatinine 2.10, sodium 133, potassium 4.9, chloride 101, CO2 is 21, calcium 8.6. LFTs: Total protein 7.9, albumin 2.7, bilirubin total 0.7, alkaline phosphatase 115, AST was 69, ALT is 47. Troponin less than 0.02. WBC 20.7, hemoglobin 10.7, platelet count 307,000.   Chest x-ray, portable, showed cardiomegaly without any acute disease.   Urinalysis was  negative.   CT scan of his left elbow showed a dorsal proximal forearm soft tissue abscess in the subcutaneous fat, measuring 4 mm x 14 mm, adjacent cellulitis, elbow osteoarthritis and loose bodies.   Most recent creatinine was 0.97.   CONSULTS DURING HOSPITALIZATION: Dr. Elvina Mattes, Dr. Lucky Cowboy, Dr. Candiss Norse, Dr. Weber Cooks, Dr. Rudene Christians, Dr. Burt Knack.   HOSPITAL COURSE: Please refer to history and physical done by the admitting physician. The patient is a 64 year old, morbidly obese male, with history of CVA and sleep apnea, who presented with complaint of hypotension and recurrent falls. The patient was seen in the ED and was noted to have a leukocytosis of 20,000. He was thought to have clinical sepsis. Initially it was felt that this could be a UTI or cellulitis. The patient was noted to have a gangrenous right hallux. The patient underwent surgery of this with resection. Cultures were positive for MRSA, as well as Stenotrophomonas; however, the patient's infection was completely removed. He was seen by consultation by podiatry throughout the hospitalization. They will keep a close evaluation on the patient as an outpatient. He was also seen by vascular, and for better circulation, underwent an angioplasty as described above. The patient also kept complaining of pain in his left elbow. Therefore, a CT scan of the elbow was done. There was also erythema in the elbow. CT did show a small minimal amount of possible fluid collection. Therefore surgery and an orthopedic consult were obtained. They did not feel that there was an anything drainable. The patient was kept on broad-spectrum antibiotics with improvement in his erythema and swelling. He will need outpatient follow-up for that, as well. The patient also complained of depression and he was  seen by psychiatry, started on some Wellbutrin. The patient also noted to have acute renal failure on admission, was hydrated, and his renal function normalized.   At this time,  the patient was recommended to go to rehabilitation; however, he wanted to go home, so arrangements for home discharge are made with home health. Discharge instructions for history of CHF given.   DISCHARGE MEDICATIONS: Carbamazepine 300 mg 1 tablet p.o. b.i.d., benazepril 40 mg 1 tablet p.o. b.i.d., Lyrica 200 one tablet p.o. E(Dictation Anomaly) , gemfibrozil 600 one tablet p.o. b.i.d., Plavix 75 p.o. daily, metaxalone 800 mg 1 tablet p.o. t.i.d. as needed, Flexor patch apply topically to affected area b.i.d., fluticasone 2 sprays daily, naproxen 500 mg 1 tablet p.o. b.i.d., duloxetine 30 mg 3 capsules daily, amlodipine 10 daily, montelukast 10 mg daily, Protonix 40 daily, glyburide-metformin 5/500 two tablets b.i.d., senna 1 tablet p.o. b.i.d. p.r.n. constipation, Lantus 41 units at bedtime, OxyContin 20 mg t.i.d., bupropion 150 mg 1 tablet p.o. b.i.d., MiraLax 17 grams daily, Bactrim double-strength 1 tablet p.o. b.i.d. x 7 days.   If there worsening of his infection or worsening of his elbow cellulitis, he needs to be referred to ID as an outpatient. Home health referral with physical therapy and nurse aide has been made.   DIET: Low sodium, low fat, low cholesterol, carbohydrate-controlled diet.   ACTIVITY: As tolerated. Heel weightbearing right leg with shoe and use a walker.   FOLLOW-UP: With Dr. Elvina Mattes this Friday. Follow up with primary M.D. in 1 week. BMP check at the time of visit with primary M.D. to make sure his creatinine is stable with the Bactrim therapy. Also follow up with orthopedics, Dr. Jefm Bryant, as he was doing for his shoulder issues.   TIME SPENT: 35 minutes.   ____________________________ Lafonda Mosses Posey Pronto, MD shp:jr D: 12/19/2013 08:08:57 ET T: 12/19/2013 09:33:59 ET JOB#: [id number]  cc: Tiesha Marich H. Posey Pronto, MD, <Dictator> Alric Seton MD ELECTRONICALLY SIGNED 12/25/2013 10:30

## 2014-09-06 NOTE — Op Note (Signed)
PATIENT NAME:  Victor Wise, Victor Wise MR#:  [id number] DATE OF BIRTH:  04-Apr-1951  DATE OF PROCEDURE:  12/12/2013  PREOPERATIVE DIAGNOSIS: Peripheral artery disease with gangrene, right lower extremity, status post digital amputation.   POSTOPERATIVE DIAGNOSIS:  Peripheral artery disease with gangrene, right lower extremity, status post digital amputation.   PROCEDURES:  1.  Ultrasound guidance for vascular access to the left femoral artery.  2.  Catheter placement into right posterior tibial artery from left femoral approach.  3.  Aortogram and selective right lower extremity angiogram.  4.  Percutaneous transluminal angioplasty of the tibioperoneal trunk and posterior tibial arteries with 3 mm diameter conventional and a 4 mm diameter drug-coated angioplasty balloon.  5.  Percutaneous transluminal angioplasty of right mid to distal superficial femoral artery and popliteal arteries with 6 mm diameter drug-coated angioplasty balloon.  6.  Self-expanding stent to the right superficial femoral artery and popliteal arteries for dissection and greater than 50% residual stenosis after angioplasty with a 7 mm diameter self-expanding stent.  7.  StarClose closure device, left femoral arteries.   SURGEON: Leotis Pain, M.D.   ANESTHESIA: Local with moderate conscious sedation.   ESTIMATED BLOOD LOSS: 25 mL.   INDICATION FOR PROCEDURE: This is a 64 year old gentleman with gangrenous changes in his right foot. He has already had a digital amputation earlier in the day. He is brought in for angiogram for further evaluation and potential improvement of its perfusion. Risks and benefits were discussed. Informed consent was obtained.   DESCRIPTION OF PROCEDURE: The patient is brought to the vascular suite. Groins were shaved and prepped, and a sterile surgical field was created. The left femoral head was localized with fluoroscopy. Ultrasound was used to visualize a patent left femoral artery. It was then accessed  under direct ultrasound guidance without difficulty with Seldinger needle. A J-wire and 5 French sheath were then placed. Pigtail catheter was placed at the aorta at the L1-L2 level and AP grams performed. This showed the aorta and iliac segments to be widely patent. The renal arteries were widely patent. I then crossed the aortic bifurcation and advanced to the right femoral head and selective right lower extremity angiogram was then performed. This demonstrated a patent common femoral artery, profunda femoris artery, and the proximal superficial femoral artery was patent; however, in the mid to distal superficial femoral artery, there was about a 60%-70% stenosis and then about 5 cm downstream from this was an occlusion.  He reconstituted the popliteal artery just above the knee, but then this reoccluded down into the distal popliteal artery tibioperoneal trunk.  His best distal target was posterior tibial artery, which was large and reconstituted through collaterals in the proximal posterior tibial artery. The patient was systemically heparinized.  A 6-French Ansell sheath was placed over the Terumo advantage wire. The initial SFA stenoses and occlusion were easy to get into and get across.  It got into the popliteal artery and with a mild amount of difficulty using a Kumpe catheter and exchanging for a CXI catheter, was able to advance into the tibioperoneal trunk and posterior tibial artery. The CXI catheter was advanced in the posterior tibial artery and intraluminal flow was confirmed.  I then placed an 0.18 wire and treated the posterior tibial artery, tibioperoneal trunk, and distal popliteal arteries with a 3 mm diameter angioplasty balloon. This had a suboptimal result.  He had very large arteries and tibioperoneal trunk and proximal posterior tibial arteries remain stenotic, and I used a 4 mm diameter  x 15 cm in length Lutonix drug-coated balloon with a good angiographic completion result. I exchanged  for an 0.035 wire and turned my attention to the popliteal and SFA lesions. A 6 mm diameter Lutonix drug-coated angioplasty balloon was used to treat this area with a spiral dissection and about a 70% residual stenosis at the proximal entry point. The dissection carried on for quite a ways and a 7 mm diameter x 20 cm length self-expanding stent was taken from the proximal endpoint down to the popliteal artery at the level of the knee.  Post dilated with a 6 mm balloon with an excellent angiographic completion result and less than 20% residual stenosis identified. At this point, the runoff was brisk and I elected to terminate the procedure. The sheath was pulled back to the ipsilateral external iliac artery and oblique arteriogram was performed. StarClose closure device was then deployed in the usual fashion with excellent hemostatic result. The patient tolerated the procedure well and was taken to the recovery room in stable condition.      ____________________________ Algernon Huxley, MD jsd:ts D: 12/14/2013 10:07:23 ET T: 12/14/2013 11:30:52 ET JOB#: [id number]  cc: Algernon Huxley, MD, <Dictator> Algernon Huxley MD ELECTRONICALLY SIGNED 12/24/2013 14:12

## 2014-09-06 NOTE — Discharge Summary (Signed)
PATIENT NAME:  Victor Wise, Victor Wise MR#:  [id number] DATE OF BIRTH:  1950/07/20  DATE OF ADMISSION:  08/30/2013 DATE OF DISCHARGE:  09/01/2013  PRIMARY CARE PROVIDER: Clayborn Bigness, MD  DISCHARGE DIAGNOSES:  1. Acute cerebrovascular accident with slurred speech.  2. Hypertension.  3. Hypertriglyceridemia.  4. Chronic lower extremity weakness and right arm pain.  5. Diabetes mellitus.  6. Diabetic neuropathy.   CONSULT: Neurology.   RADIOLOGICAL DATA:  Imaging studies done include a CT scan of the head without contrast which showed nothing acute. Ultrasound carotid Doppler showed no significant stenosis.   Echocardiogram showed no source for his CVA. Nothing acute.   ADMITTING HISTORY AND PHYSICAL: Please see the detailed H and P dictated previously by Dr. Waldron Labs.  In brief, a 64 year old male patient with morbid obesity, hypertension, hyperlipidemia, diabetes presented to the hospital complaining of slurred speech, was admitted to rule out CVA.   HOSPITAL COURSE: This was thought to be secondary to acute CVA as he also had some mild left-sided facial droop. Initial  CT head was negative. Cannot get an MRI secondary to his pacemaker. CT was repeated in 36 hours without any changes. The patient has been started on Plavix, statin. Also for his hypertriglyceridemia, he is on gemfibrozil. PT has seen the patient and recommended home health with PT for his chronic lower extremity weakness. Does not have any dysphagia.   Diabetes, hypertension fairly controlled during the hospitalization.   Prior to discharge, the patient's slurred speech is improved. Has mild left facial droop.   PHYSICAL EXAMINATION: NEUROLOGICAL: Motor strength 5/5 in upper extremities.  HEART:  S1, S2, without any murmurs.  LUNGS: Clear to auscultation.   DISCHARGE MEDICATIONS:  1. Glyburide metformin 5/500, 2 tablets oral 2 times a day.  2. Oxcarbazepine 300 mg oral 2 times a day.  3. Flonase 50 mcg 2 sprays nasal once a  day.  4. Amlodipine 10 mg daily.  5. Benazepril 40 mg 2 times a day.  6. Cymbalta 30 mg 3 capsules once a day.  7. Skelaxin 400 mg oral 2 times a day as needed.  8. Singulair 10 mg oral once a day.  9. Protonix 40 mg oral once a day.  10. Lantus 40 units subcutaneously once a day.  11. Humulin sliding scale.  12. Lyrica 200 mg oral 3 times a day.  13. OxyContin 200 mg extended release 3 times a day. 14.  Zyrtec 10 mg oral once a day.  15. Ciprofloxacin dexamethasone ear drops 1 drop 4 times a day for 5 days.  16. Gemfibrozil 600 mg oral 2 times a day. 17. Plavix 75 mg daily.   DISCHARGE INSTRUCTIONS: Home health with physical therapy. Low-sodium, low-fat, carbohydrate-controlled diet. Activity as tolerated. Follow up with primary care physician in 1 or 2 weeks and Dr. Manuella Ghazi with neurology in 1 or 2 weeks.   The patient was supposed to get a CT myelogram along with an LP during the hospital stay for his right extremity weakness and lower extremity weakness per Dr. Tamala Julian of neurology, but since he received heparin and Plavix, this could not be done by interventional radiology and needs to be rescheduled as outpatient by Dr. Manuella Ghazi of neurology. The patient is aware of this.   TIME SPENT ON DAY OF DISCHARGE AND DISCHARGE ACTIVITY: 45 minutes.       ____________________________ Leia Alf Kenny Rea, MD srs:dd D: 09/01/2013 14:04:31 ET T: 09/01/2013 20:10:25 ET JOB#: [id number]  cc: Alveta Heimlich R. Mitsuko Luera, MD, <Dictator> Dr.  Scotty Court, MD  [place] MD ELECTRONICALLY SIGNED 09/07/2013 11:37

## 2014-09-06 NOTE — Consult Note (Signed)
Psychiatry: Follow-up note for this patient with multiple medical problems and symptoms of depression.  Saw the patient yesterday and started him on bupropion extended release 150 mg once a day. interview today the patient looks like he is a little bit better groomed.  He is alert awake and interactive.  He is more polite but no less angry than he was yesterday.  Eye contact good psychomotor activity normal.  Speech normal rate tone and volume.  Affect angry and irritable but not hostile towards me.  Thoughts appear to be lucid.  He certainly does seem to look on the worse side of everything and a hold a real grudge against his sister.  Does not however appear to be psychotic.  Patient very clearly tells me that he has no intention of killing himself and that he is a "survivor".  No side effects noted from any of his new medicine. tells me that he is going to refuse to stay at his sister's house even though she told social work that he could stay there.  He feels to humiliated by the way that she speaks to him.  Instead he says he plans to go live in his truck until he can get some place to live from [employer].  I talked with him gently trying to see if he could admit the absurdity of that and that it would be safer to go stay with his sister but he appears adamant about it. but no acute suicidal risk.  Patient is impulsive and angry but understands the risks of his behavior.  He agrees to the Wellbutrin.  Increase the dose to 300 mg of extended release a day.  Supportive counseling done.  Encouraged him to continue to address his mood problems with his outpatient providers.  He tells me that he is going to be "thrown out" of the hospital tomorrow.  Try and check in if he is still here no change to the diagnosis  Electronic Signatures: Antania Hoefling, Madie Reno (MD)  (Signed on 04-Aug-15 18:18)  Authored  Last Updated: 04-Aug-15 18:18 by Gonzella Lex (MD)

## 2014-09-06 NOTE — Consult Note (Signed)
Present Illness A 64 year old male with past medical history of spinal stenosis and spinal cord stimulator, chronic pain syndrome, hypertension, hyperlipidemia, insulin-dependent diabetes, irregular heartbeat and diastolic congestive heart failure. He started feeling very weak and he fell down 4 times in the last 3 days. The first time when he fell down, that was 3 days ago.  He presented to the ER and was found to have a blood pressure found 80/50 and so started on normal saline boluses. He was admitted with the diagnosis of sepsis.  During evaluation it was noted that he has gangrenous changes to his left great toe.  He believes this started after injuring it during on of his falls.   PAST MEDICAL HISTORY:  1.  Spinal stenosis. 2.  Chronic pain syndrome. 3.  Hypertension. 4.  Hyperlipidemia. 5.  Insulin-dependent diabetes.  6.  Congestive heart failure.   7.  History of irregular heartbeat. 8.  Asthma. 9.  History of bradycardia. 10.  Cerebrovascular accident.   PAST SURGICAL HISTORY:  1.  Back surgery. 2.  Cholecystectomy. 3.  Bladder cord stimulator placement. 4.  Right knee surgery.  5.  Subacromial decompression of the left shoulder.  6.  Tonsillectomy.  7.  Rotator cuff repair, right shoulder. 8.  Hypotestosteronism.   Home Medications: Medication Instructions Status  senna 1 tab(s) orally 2 times a day, As needed, constipation Active  benazepril 40 mg oral tablet 1 tab(s) orally 2 times a day Active  OXcarbazepine 300 mg oral tablet 1 tab(s) orally 2 times a day Active  Lyrica 200 mg oral capsule 1 cap(s) orally 3 times a day Active  metaxalone 800 mg oral tablet 1 tab(s) orally 3 times a day, As Needed for muscle spasms Active  Flector Patch 1.3% topical film, extended release Apply topically to affected area 2 times a day Active  fluticasone nasal 50 mcg/inh nasal spray 2 spray(s) nasal once a day Active  naproxen 500 mg oral delayed release tablet 1 tab(s) orally 2  times a day (with meals) Active  DULoxetine 30 mg oral delayed release capsule 3 cap(s) orally once a day Active  amLODIPine 10 mg oral tablet 1 tab(s) orally once a day Active  montelukast 10 mg oral tablet 1 tab(s) orally once a day for rhinitis Active  pantoprazole 40 mg oral delayed release tablet 1 tab(s) orally once a day for reflux Active  glyBURIDE-metformin 5 mg-500 mg oral tablet 2 tab(s) orally 2 times a day (with meals) Active  gemfibrozil 600 mg oral tablet 1 tab(s) orally 2 times a day (before meals) Active  clopidogrel 75 mg oral tablet 1 tab(s) orally once a day Active  Lantus 100 units/mL subcutaneous solution 41 unit(s) subcutaneous once a day (at bedtime) Active  OxyCONTIN 20 mg oral tablet, extended release 20 milligram(s) orally 3 times A DAY Active    Yellow Dye #5: Itching  ASA: Other  METI BISULFATES CAUSE ITCHING (also in Normal Saline): Other  Ultram: Other  Endopa caused cardiac arrest: Unknown  PCN: Other, Itching  Adhesive: Other  Crestor: Unknown  Case History:  Family History Non-Contributory   Social History negative tobacco, negative ETOH, negative Illicit drugs   Review of Systems:  Fever/Chills No   Cough No   Sputum No   Abdominal Pain No   Diarrhea No   Constipation No   Nausea/Vomiting No   SOB/DOE No   Chest Pain No   Telemetry Reviewed NSR   Dysuria No   Physical Exam:  GEN well developed, no acute distress, obese   HEENT hearing intact to voice, moist oral mucosa   NECK supple  trachea midline   RESP normal resp effort  no use of accessory muscles   CARD regular rate  no JVD   ABD soft  nondistended   EXTR positive edema, gangrene left first toe, popliteal and pedal s are not palpable   SKIN positive ulcers, skin turgor good   NEURO cranial nerves intact, follows commands, sensation decreased in the feet   PSYCH alert, A+O to time, place, person   Nursing/Ancillary Notes: **Vital Signs.:   29-Jul-15  05:27  Vital Signs Type Routine  Temperature Temperature (F) 98.6  Celsius 37  Temperature Source oral  Pulse Pulse 64  Respirations Respirations 18  Systolic BP Systolic BP 262  Diastolic BP (mmHg) Diastolic BP (mmHg) 75  Mean BP 111  Pulse Ox % Pulse Ox % 99  Pulse Ox Activity Level  At rest  Oxygen Delivery Room Air/ 21 %   Hepatic:  27-Jul-15 16:47   Bilirubin, Total 0.7  Alkaline Phosphatase 115 (46-116 NOTE: New Reference Range 12/03/13)  SGPT (ALT) 47 (14-63 NOTE: New Reference Range 12/03/13)  SGOT (AST)  69  Total Protein, Serum 7.9  Albumin, Serum  2.7  Routine Micro:  27-Jul-15 16:47   Micro Text Report BLOOD CULTURE   COMMENT                   NO GROWTH IN 48 HOURS   ANTIBIOTIC                       Culture Comment NO GROWTH IN 48 HOURS  Result(s) reported on 11 Dec 2013 at 05:00PM.    17:25   Micro Text Report BLOOD CULTURE   COMMENT                   NO GROWTH IN 48 HOURS   ANTIBIOTIC                       Culture Comment NO GROWTH IN 48 HOURS  Result(s) reported on 11 Dec 2013 at 05:00PM.    23:36   Organism Name GRAM NEGATIVE ROD  Organism Quantity 15,000 CFU/ML  Micro Text Report URINE CULTURE   ORGANISM 1                15,000 CFU/ML  GRAM NEGATIVE ROD   COMMENT                   ID TO FOLLOW SENSITIVITIES TO FOLLOW   ANTIBIOTIC                       Specimen Source CLEAN CATCH  Organism 1 15,000 CFU/ML  GRAM NEGATIVE ROD  Culture Comment ID TO FOLLOW SENSITIVITIES TO FOLLOW  Result(s) reported on 11 Dec 2013 at 02:12PM.  28-Jul-15 13:59   Organism Name STREPTOCOCCUS AGALACTIAE (GROUP B)  Organism Quantity HEAVY GROWTH  Micro Text Report WOUND AER/ANAEROBIC CULT   ORGANISM 1                HEAVY GROWTH STREPTOCOCCUS AGALACTIAE (GROUP B)   COMMENT                   HOLDING FOR ADDITIONAL PATHOGENS   GRAM STAIN                RARE  WHITE BLOOD CELLS   GRAM STAIN                MANY GRAM POSITIVE COCCI IN PAIRS IN CLUSTERS   GRAM STAIN                 FEW GRAM POSITIVE ROD   ANTIBIOTIC                       Specimen Source SOURCE NOT INDICATED  Organism 1 HEAVY GROWTH STREPTOCOCCUS AGALACTIAE (GROUP B)  Culture Comment HOLDING FOR ADDITIONAL PATHOGENS  Gram Stain 1 RARE WHITE BLOOD CELLS  Gram Stain 2 MANY GRAM POSITIVE COCCI IN PAIRS IN CLUSTERS  Gram Stain 3 FEW GRAM POSITIVE ROD  Routine Chem:  27-Jul-15 16:47   Result Comment PLATELETS - SLIGHT PLATELET CLUMPING IN SPECIMEN. ACTUAL  - NUMERICAL COUNT MAY BE SOMEWHAT HIGHER THAN  - THE REPORTED VALUE.  - FIBRIN STRANDS SEEN ON SMEAR. THIS MAY  - AFFECT THE PLATELET COUNT. THE ACTUAL  - NUMERICAL COUNT MAY BE SOMEWHAT HIGHER  - THAN THE REPORTED VALUE.  Result(s) reported on 09 Dec 2013 at 05:20PM.  Result Comment POTASSIUM/CREATININE/AST - Slight hemolysis, interpret results with  - caution.  Result(s) reported on 09 Dec 2013 at 05:20PM.  Glucose, Serum  133  BUN  40  Creatinine (comp)  2.10  Sodium, Serum  133  Potassium, Serum 4.9  Chloride, Serum 101  CO2, Serum 21  Calcium (Total), Serum 8.6  Anion Gap 11  Osmolality (calc) 278  eGFR (African American)  38  eGFR (Non-African American)  33 (eGFR values <46m/min/1.73 m2 may be an indication of chronic kidney disease (CKD). Calculated eGFR is useful in patients with stable renal function. The eGFR calculation will not be reliable in acutely ill patients when serum creatinine is changing rapidly. It is not useful in  patients on dialysis. The eGFR calculation may not be applicable to patients at the low and high extremes of body sizes, pregnant women, and vegetarians.)  28-Jul-15 05:17   Glucose, Serum  146  BUN  34  Creatinine (comp)  1.62  Sodium, Serum 137  Potassium, Serum 3.9  Chloride, Serum 101  CO2, Serum 26  Calcium (Total), Serum 8.7  Anion Gap 10  Osmolality (calc) 284  eGFR (African American)  52  eGFR (Non-African American)  45 (eGFR values <656mmin/1.73 m2 may be an indication of  chronic kidney disease (CKD). Calculated eGFR is useful in patients with stable renal function. The eGFR calculation will not be reliable in acutely ill patients when serum creatinine is changing rapidly. It is not useful in  patients on dialysis. The eGFR calculation may not be applicable to patients at the low and high extremes of body sizes, pregnant women, and vegetarians.)    13:59   Result Comment GROUP B STREP - VIRTUALLY 100% OF S.AGALACTIAE (GROUP B)  - STRAINS ARE SUSCEPTIBLE TO PENICILLIN. FOR  - PENICILLIN-ALLERGIC PATIENTS, ERYTHROMYCIN  - (85-94% SENSITIVE) AND CLINDAMYCIN (80%  - SENSITIVE) ARE DRUGS OF CHOICE. CONTACT  - MICROBIOLOGY LAB TO REQUEST SENSITIVITIES  - IF NEEDED WITHIN 7 DAYS.  Result(s) reported on 11 Dec 2013 at 02:10PM.  Cardiac:  27-Jul-15 16:47   CK, Total 258 (39-308 NOTE: NEW REFERENCE RANGE  06/17/2013)  CPK-MB, Serum 1.3 (Result(s) reported on 09 Dec 2013 at [facility]  Troponin I < 0.02 (0.00-0.05 0.05 ng/mL or less: NEGATIVE  Repeat testing in 3-6 hrs  if clinically indicated. >0.05  ng/mL: POTENTIAL  MYOCARDIAL INJURY. Repeat  testing in 3-6 hrs if  clinically indicated. NOTE: An increase or decrease  of 30% or more on serial  testing suggests a  clinically important change)  Routine UA:  27-Jul-15 23:36   Color (UA) Yellow  Clarity (UA) Hazy  Glucose (UA) >=500  Bilirubin (UA) Negative  Ketones (UA) Negative  Specific Gravity (UA) 1.014  Blood (UA) 1+  pH (UA) 5.0  Protein (UA) Negative  Nitrite (UA) Negative  Leukocyte Esterase (UA) Negative (Result(s) reported on 10 Dec 2013 at 12:04AM.)  RBC (UA) 16 /HPF  WBC (UA) 3 /HPF  Bacteria (UA) 3+  Epithelial Cells (UA) NONE SEEN  Mucous (UA) PRESENT  Hyaline Cast (UA) 14 /LPF (Result(s) reported on 10 Dec 2013 at 12:04AM.)  Routine Hem:  27-Jul-15 16:47   WBC (CBC)  20.7  RBC (CBC)  4.21  Hemoglobin (CBC)  10.7  Hematocrit (CBC)  34.6  Platelet Count (CBC) 307  MCV 82   MCH  25.5  MCHC  31.0  RDW  17.1  Neutrophil % 79.2  Lymphocyte % 12.2  Monocyte % 6.3  Eosinophil % 1.4  Basophil % 0.9  Neutrophil #  16.4  Lymphocyte # 2.5  Monocyte #  1.3  Eosinophil # 0.3  Basophil #  0.2  28-Jul-15 05:17   WBC (CBC) 10.6  RBC (CBC)  4.01  Hemoglobin (CBC)  10.5  Hematocrit (CBC)  32.7  Platelet Count (CBC) 241  MCV 81  MCH 26.1  MCHC 32.1  RDW  16.9  Neutrophil % 81.5  Lymphocyte % 10.0  Monocyte % 6.2  Eosinophil % 1.7  Basophil % 0.6  Neutrophil #  8.6  Lymphocyte # 1.1  Monocyte # 0.7  Eosinophil # 0.2  Basophil # 0.1 (Result(s) reported on 10 Dec 2013 at 06:27AM.)    Impression 1.  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  Prior to surgery he should undergo angiography with the hope for intervention.  The risks and benefits were discussed with tehpatient and he wishes to proceed.  2.  Cellulitis on left forearm. This is secondary to a fall, which he had 3 days. That is what he has this morning now. We will give vancomycin for that at this point.  3.  Possible urinary tract infection. We are still not able to collect the urine sample, as he has a small ureteral opening and nurse is unable to pass catheter. The patient is hypotensive, so did not have urine output yet, but after 2 liters of saline, blood pressure came up, and the patient also drank 3 to 4 glasses of water, so likely he will have urination very soon, and we will send a sample for further workup. Meanwhile, we will give Rocephin and wait for the culture results. Blood culture is also sent in by ER. We will have to just follow that.  4.  Hypertension. The patient has a history of hypertension, was taking amlodipine and benazepril  and he continued taking that until today. This is also contributing to his hypotension. We will hold it at this time and continue monitoring.  5.  Diabetes, is taking Lantus and oral hypoglycemic agents. We will hold oral,  but continue long-acting insulin 35 units and put on insulin sliding scale coverage.  6.  Asthma. We will continue montelukast.   Electronic Signatures: Hortencia Pilar (MD)  (Signed 29-Jul-15 20:00)  Authored: General Aspect/Present Illness, Home Medications, Allergies, History and  Physical Exam, Vital Signs, Labs, Impression/Plan   Last Updated: 29-Jul-15 20:00 by Hortencia Pilar (MD)

## 2014-09-06 NOTE — H&P (Signed)
PATIENT NAME:  Victor Wise, Victor Wise MR#:  [id number] DATE OF BIRTH:  07-30-1950  DATE OF ADMISSION:  12/09/2013  PRIMARY CARE PHYSICIAN: Dr. Clayborn Bigness.   REFERRING EMERGENCY ROOM PHYSICIAN: Dr. Lisa Roca.   CHIEF COMPLAINT:  Hypotension and falling.   HISTORY OF PRESENT ILLNESS: A 64 year old male with past medical history of spinal stenosis and spinal cord stimulator, chronic pain syndrome, hypertension, hyperlipidemia, insulin-dependent diabetes, irregular heartbeat and diastolic congestive heart failure. He was in the Emergency Room on the 13th of May, and on that day he was found having some UTI, was given ciprofloxacin for 3 days and sent home. He took 3 days of ciprofloxacin. His urine started clearing up, but then again now in the last few days, he noticed his urine is getting cloudy again. He denies any other complaint, but for the last 3 to 4 days, he started feeling very weak and he fell down 4 times in the last 3 days. The first time when he fell down, that was 3 days ago, he called EMS and they advised him to come to the Emergency Room, but he refused and just waited home on the weekend, and then today went to his primary care physician's office and was found having blood pressures low, so was advised to go to the Emergency Room directly from PMD's office. Came here; blood pressure found 80/50 and so started on normal saline boluses. After 2 liters of normal saline bolus, blood pressure came up to 100/60, and the patient started feeling a little better, so given to hospitalist team as admission for sepsis. On other issue, when he fell down first time, he hit his left forearm to something and since then it is swollen and red, which is also warm to touch while presentation to ER, and so that might be also the source of infection. In the ER, he is still not able to make any urine as he had hypotension and mild renal failure. Nurse tried to do Foley catheterization, but he has a small sphincter and  could not pass the catheter and waiting for patient to have some urination and pee.   REVIEW OF SYSTEMS:  CONSTITUTIONAL: Negative for fever, fatigue. Positive for weakness. No pain or weight loss.  EYES: No blurring, double vision, discharge or redness.  EARS, NOSE, THROAT: No tinnitus, ear pain or hearing loss.  RESPIRATORY: No cough, wheezing, hemoptysis or shortness of breath.  CARDIOVASCULAR: No chest pain, orthopnea, edema or palpitations.  GASTROINTESTINAL: No nausea, vomiting, diarrhea, abdominal pain.  GENITOURINARY: No dysuria or hematuria, but noticed some cloudy urine.  ENDOCRINE: No nocturia, hematuria, heat or cold intolerance or excessive sweating.  SKIN: No acne, rashes or lesions on the skin, but on the left forearm there is some redness and swelling.  MUSCULOSKELETAL: No pain or swelling in the joints.  NEUROLOGICAL: No numbness, weakness, tremor or vertigo, but has generalized weakness and episodes fall.  PSYCHIATRIC: No anxiety, insomnia, bipolar disorder.   PAST MEDICAL HISTORY:   1.  Spinal stenosis status post spinal cord stimulator.  2.  Chronic pain syndrome.  3.  Hypertension.  4.  Hyperlipidemia.  5.  Insulin-dependent diabetes mellitus.  6.  History of irregular heartbeat.  7.  Congestive heart failure with diastolic dysfunction.  8.  Asthma.  9.  History of bradycardia.  10.  Cerebrovascular accident.   PAST SURGICAL HISTORY:  1.  Low back surgery.  2.  Cholecystectomy.  3.  Spinal cord bladder stimulator placement.  4.  Right knee surgery.  5.  Subacromial decompression of his left shoulder.  6.  Left shoulder arthroscopy.  7.  Tonsillectomy.  8.  Right shoulder rotator cuff surgery.  9.  Hypotestosteronism.  10.  Cerebrovascular accident.   SOCIAL HISTORY: Lives at home with his sister, walks with a walker and cane sometimes. No history of any smoking or alcohol use.   FAMILY HISTORY: Significant for heart disease and stroke in both parents.  Sister with heart problem.  MEDICATIONS:  1.  Pantoprazole 40 mg oral once a day.  2.  Oxcarbazepine 300 mg 2 times a day.  3.  Naproxen 500 mg oral 2 times a day.  4.  Montelukast 10 mg oral once a day.  5.  Metaxalone 800 mg oral 3 times a day.  6.  Lyrica 200 mg oral capsule 3 times a day.  7.  Lantus 41 units subcutaneous at bedtime.  8.  Glyburide and metformin 5/500 mg oral tablet 2 tablets 2 times a day.  9.  Gemfibrozil 600 mg oral tablet 2 times a day.  10.  Fluticasone 50 mcg inhalation nasal spray 2 sprays once a day.  11.  Duloxetine 30 mg oral, 3 capsules once a day.  12.  Plavix 75 mg oral once a day.  13.  Benazepril 40 mg oral 2 times a day.  14.  Amlodipine 10 mg once a day.   PHYSICAL EXAMINATION: VITAL SIGNS: In ER, temperature 97.6. Pulse is 64. Blood pressure was 82/48 on presentation, which came up to 104/65,  GENERAL: The patient is morbidly obese, fully alert and oriented to time, place and person, and does not appear in any acute distress.  HEENT: Head and neck atraumatic. Conjunctivae pink. Oral mucosa moist.  NECK: Supple. No JVD.  RESPIRATORY: Bilateral clear and equal air entry.  CARDIOVASCULAR: S1, S2 present, regular. No murmur.  ABDOMEN: Soft, nontender. Bowel sounds present. No organomegaly.  SKIN: There is a rash, redness and some warm feeling on left forearm skin with no clear margin. MUSCULOSKELETAL:  Joints: No swelling or tenderness.  NEUROLOGICAL: No tremor or vertigo. Moves all 4 limbs. Power 5/5. Follows commands. No rigidity.  PSYCHIATRIC: Does not appear in any acute psychiatric illness at this time.   IMPORTANT LABORATORY RESULTS: Glucose 133. BUN 40, creatinine 2.10, sodium 133. Potassium is 4.9. Chloride is 101. CO2 is 21. Calcium is 8.6.   Total protein 7.9, albumin 2.7, bilirubin 0.7, alkaline phosphatase 115, SGOT 69, SGPT 47.   Troponin is less than 0.02.   WBC 20.7, hemoglobin 10.7 and platelet count 307; MCV is 82.   CHEST  X-RAY, PORTABLE, SINGLE VIEW: Cardiomegaly without acute disease.   Urine sample still needs to be collected.   ASSESSMENT AND PLAN: A 65 year old male with multiple past medical history, morbidly obese, recently treated with urinary tract infection 10 days ago; has been feeling very weak and falling down for the last 3 days and found having hypotension and PMD's office, so was sent in. Urine culture still needs to be collected, but complains of cloudy urine and has redness and warmness on his left forearm after having the fall 2 days ago.  1.  Sepsis. This was evident by hypotension and elevated white cell count and feeling dizzy and fall for the last 3 days; responded to IV fluid saline boluses. This is most likely secondary to UTI or cellulitis. We will treat with empiric antibiotic currently and we will wait for the culture results.  2.  Cellulitis on left forearm. This is secondary to a fall, which he had 3 days. That is what he has this morning now. We will give vancomycin for that at this point.  3.  Possible urinary tract infection. We are still not able to collect the urine sample, as he has a small ureteral opening and nurse is unable to pass catheter. The patient is hypotensive, so did not have urine output yet, but after 2 liters of saline, blood pressure came up, and the patient also drank 3 to 4 glasses of water, so likely he will have urination very soon, and we will send a sample for further workup. Meanwhile, we will give Rocephin and wait for the culture results. Blood culture is also sent in by ER. We will have to just follow that.  4.  Hypertension. The patient has a history of hypertension, was taking amlodipine and benazepril  and he continued taking that until today. This is also contributing to his hypotension. We will hold it at this time and continue monitoring.  5.  Diabetes, is taking Lantus and oral hypoglycemic agents. We will hold oral, but continue long-acting insulin 35  units and put on insulin sliding scale coverage.  6.  Asthma. We will continue montelukast.   CODE STATUS: FULL CODE.   Total time spent on this admission is 50 minutes.     ____________________________ Ceasar Lund Anselm Jungling, MD vgv:dmm D: 12/09/2013 19:35:18 ET T: 12/09/2013 20:01:12 ET JOB#: [id number]  cc: Ceasar Lund. Anselm Jungling, MD, <Dictator> Lavera Guise, MD [facility] MD ELECTRONICALLY SIGNED 12/10/2013 16:17

## 2014-09-19 DIAGNOSIS — I679 Cerebrovascular disease, unspecified: Secondary | ICD-10-CM | POA: Diagnosis not present

## 2014-09-19 DIAGNOSIS — D485 Neoplasm of uncertain behavior of skin: Secondary | ICD-10-CM | POA: Diagnosis not present

## 2014-09-19 DIAGNOSIS — I10 Essential (primary) hypertension: Secondary | ICD-10-CM | POA: Diagnosis not present

## 2014-09-19 DIAGNOSIS — F3341 Major depressive disorder, recurrent, in partial remission: Secondary | ICD-10-CM | POA: Diagnosis not present

## 2014-09-19 DIAGNOSIS — J309 Allergic rhinitis, unspecified: Secondary | ICD-10-CM | POA: Diagnosis not present

## 2014-09-19 DIAGNOSIS — E1151 Type 2 diabetes mellitus with diabetic peripheral angiopathy without gangrene: Secondary | ICD-10-CM | POA: Diagnosis not present

## 2014-09-26 DIAGNOSIS — I10 Essential (primary) hypertension: Secondary | ICD-10-CM | POA: Insufficient documentation

## 2014-10-02 DIAGNOSIS — I498 Other specified cardiac arrhythmias: Secondary | ICD-10-CM | POA: Diagnosis not present

## 2014-10-02 DIAGNOSIS — I110 Hypertensive heart disease with heart failure: Secondary | ICD-10-CM | POA: Insufficient documentation

## 2014-10-02 DIAGNOSIS — I5032 Chronic diastolic (congestive) heart failure: Secondary | ICD-10-CM | POA: Diagnosis not present

## 2014-10-02 DIAGNOSIS — I10 Essential (primary) hypertension: Secondary | ICD-10-CM | POA: Diagnosis not present

## 2014-11-05 DIAGNOSIS — L851 Acquired keratosis [keratoderma] palmaris et plantaris: Secondary | ICD-10-CM | POA: Diagnosis not present

## 2014-11-05 DIAGNOSIS — B351 Tinea unguium: Secondary | ICD-10-CM | POA: Diagnosis not present

## 2014-11-05 DIAGNOSIS — E1142 Type 2 diabetes mellitus with diabetic polyneuropathy: Secondary | ICD-10-CM | POA: Diagnosis not present

## 2014-11-10 DIAGNOSIS — Z5181 Encounter for therapeutic drug level monitoring: Secondary | ICD-10-CM

## 2014-11-10 DIAGNOSIS — Z01818 Encounter for other preprocedural examination: Secondary | ICD-10-CM | POA: Insufficient documentation

## 2014-11-10 DIAGNOSIS — Z79891 Long term (current) use of opiate analgesic: Secondary | ICD-10-CM

## 2014-11-20 DIAGNOSIS — E1151 Type 2 diabetes mellitus with diabetic peripheral angiopathy without gangrene: Secondary | ICD-10-CM | POA: Diagnosis not present

## 2014-11-20 DIAGNOSIS — I10 Essential (primary) hypertension: Secondary | ICD-10-CM | POA: Diagnosis not present

## 2014-11-20 DIAGNOSIS — E782 Mixed hyperlipidemia: Secondary | ICD-10-CM | POA: Diagnosis not present

## 2014-11-20 DIAGNOSIS — Z0001 Encounter for general adult medical examination with abnormal findings: Secondary | ICD-10-CM | POA: Diagnosis not present

## 2014-11-20 DIAGNOSIS — I679 Cerebrovascular disease, unspecified: Secondary | ICD-10-CM | POA: Diagnosis not present

## 2014-11-20 DIAGNOSIS — F3341 Major depressive disorder, recurrent, in partial remission: Secondary | ICD-10-CM | POA: Diagnosis not present

## 2014-12-18 ENCOUNTER — Inpatient Hospital Stay
Admission: EM | Admit: 2014-12-18 | Discharge: 2014-12-19 | DRG: 871 | Disposition: A | Discharge status: Other Acute Inpatient Hospital | Payer: Commercial Managed Care - HMO | Attending: Internal Medicine | Admitting: Internal Medicine

## 2014-12-18 ENCOUNTER — Encounter: Payer: Self-pay | Admitting: Specialist

## 2014-12-18 ENCOUNTER — Emergency Department: Payer: Commercial Managed Care - HMO

## 2014-12-18 ENCOUNTER — Inpatient Hospital Stay
Admit: 2014-12-18 | Discharge: 2014-12-18 | Disposition: A | Discharge status: Home / Self Care | Payer: Commercial Managed Care - HMO | Attending: Emergency Medicine | Admitting: Emergency Medicine

## 2014-12-18 DIAGNOSIS — N390 Urinary tract infection, site not specified: Secondary | ICD-10-CM | POA: Diagnosis present

## 2014-12-18 DIAGNOSIS — Z886 Allergy status to analgesic agent status: Secondary | ICD-10-CM

## 2014-12-18 DIAGNOSIS — E785 Hyperlipidemia, unspecified: Secondary | ICD-10-CM | POA: Diagnosis present

## 2014-12-18 DIAGNOSIS — M1990 Unspecified osteoarthritis, unspecified site: Secondary | ICD-10-CM | POA: Diagnosis not present

## 2014-12-18 DIAGNOSIS — Z888 Allergy status to other drugs, medicaments and biological substances status: Secondary | ICD-10-CM | POA: Diagnosis not present

## 2014-12-18 DIAGNOSIS — Z882 Allergy status to sulfonamides status: Secondary | ICD-10-CM

## 2014-12-18 DIAGNOSIS — Z6841 Body Mass Index (BMI) 40.0 and over, adult: Secondary | ICD-10-CM

## 2014-12-18 DIAGNOSIS — I10 Essential (primary) hypertension: Secondary | ICD-10-CM | POA: Diagnosis present

## 2014-12-18 DIAGNOSIS — Z801 Family history of malignant neoplasm of trachea, bronchus and lung: Secondary | ICD-10-CM | POA: Diagnosis not present

## 2014-12-18 DIAGNOSIS — R7989 Other specified abnormal findings of blood chemistry: Secondary | ICD-10-CM | POA: Diagnosis not present

## 2014-12-18 DIAGNOSIS — G4733 Obstructive sleep apnea (adult) (pediatric): Secondary | ICD-10-CM | POA: Diagnosis present

## 2014-12-18 DIAGNOSIS — I2109 ST elevation (STEMI) myocardial infarction involving other coronary artery of anterior wall: Secondary | ICD-10-CM | POA: Diagnosis not present

## 2014-12-18 DIAGNOSIS — I2510 Atherosclerotic heart disease of native coronary artery without angina pectoris: Secondary | ICD-10-CM | POA: Diagnosis not present

## 2014-12-18 DIAGNOSIS — R569 Unspecified convulsions: Secondary | ICD-10-CM | POA: Diagnosis not present

## 2014-12-18 DIAGNOSIS — R6510 Systemic inflammatory response syndrome (SIRS) of non-infectious origin without acute organ dysfunction: Secondary | ICD-10-CM | POA: Diagnosis present

## 2014-12-18 DIAGNOSIS — R296 Repeated falls: Secondary | ICD-10-CM | POA: Diagnosis present

## 2014-12-18 DIAGNOSIS — M869 Osteomyelitis, unspecified: Secondary | ICD-10-CM | POA: Diagnosis not present

## 2014-12-18 DIAGNOSIS — R55 Syncope and collapse: Secondary | ICD-10-CM | POA: Diagnosis not present

## 2014-12-18 DIAGNOSIS — M549 Dorsalgia, unspecified: Secondary | ICD-10-CM | POA: Diagnosis present

## 2014-12-18 DIAGNOSIS — Z7902 Long term (current) use of antithrombotics/antiplatelets: Secondary | ICD-10-CM | POA: Diagnosis not present

## 2014-12-18 DIAGNOSIS — K219 Gastro-esophageal reflux disease without esophagitis: Secondary | ICD-10-CM | POA: Diagnosis present

## 2014-12-18 DIAGNOSIS — I214 Non-ST elevation (NSTEMI) myocardial infarction: Secondary | ICD-10-CM | POA: Diagnosis present

## 2014-12-18 DIAGNOSIS — E1165 Type 2 diabetes mellitus with hyperglycemia: Secondary | ICD-10-CM | POA: Diagnosis not present

## 2014-12-18 DIAGNOSIS — G8929 Other chronic pain: Secondary | ICD-10-CM | POA: Diagnosis present

## 2014-12-18 DIAGNOSIS — Z7982 Long term (current) use of aspirin: Secondary | ICD-10-CM

## 2014-12-18 DIAGNOSIS — Z803 Family history of malignant neoplasm of breast: Secondary | ICD-10-CM

## 2014-12-18 DIAGNOSIS — M5030 Other cervical disc degeneration, unspecified cervical region: Secondary | ICD-10-CM | POA: Diagnosis not present

## 2014-12-18 DIAGNOSIS — Z794 Long term (current) use of insulin: Secondary | ICD-10-CM | POA: Diagnosis not present

## 2014-12-18 DIAGNOSIS — F329 Major depressive disorder, single episode, unspecified: Secondary | ICD-10-CM | POA: Diagnosis present

## 2014-12-18 DIAGNOSIS — Z880 Allergy status to penicillin: Secondary | ICD-10-CM | POA: Diagnosis not present

## 2014-12-18 DIAGNOSIS — N400 Enlarged prostate without lower urinary tract symptoms: Secondary | ICD-10-CM | POA: Diagnosis present

## 2014-12-18 DIAGNOSIS — S3993XA Unspecified injury of pelvis, initial encounter: Secondary | ICD-10-CM | POA: Diagnosis not present

## 2014-12-18 DIAGNOSIS — Z79899 Other long term (current) drug therapy: Secondary | ICD-10-CM

## 2014-12-18 DIAGNOSIS — A419 Sepsis, unspecified organism: Secondary | ICD-10-CM | POA: Diagnosis not present

## 2014-12-18 DIAGNOSIS — Z8249 Family history of ischemic heart disease and other diseases of the circulatory system: Secondary | ICD-10-CM | POA: Diagnosis not present

## 2014-12-18 DIAGNOSIS — Z8673 Personal history of transient ischemic attack (TIA), and cerebral infarction without residual deficits: Secondary | ICD-10-CM | POA: Diagnosis not present

## 2014-12-18 DIAGNOSIS — R079 Chest pain, unspecified: Secondary | ICD-10-CM

## 2014-12-18 DIAGNOSIS — Z91048 Other nonmedicinal substance allergy status: Secondary | ICD-10-CM | POA: Diagnosis not present

## 2014-12-18 DIAGNOSIS — J449 Chronic obstructive pulmonary disease, unspecified: Secondary | ICD-10-CM | POA: Diagnosis present

## 2014-12-18 DIAGNOSIS — E119 Type 2 diabetes mellitus without complications: Secondary | ICD-10-CM | POA: Diagnosis present

## 2014-12-18 DIAGNOSIS — Y92009 Unspecified place in unspecified non-institutional (private) residence as the place of occurrence of the external cause: Secondary | ICD-10-CM | POA: Diagnosis not present

## 2014-12-18 DIAGNOSIS — R0602 Shortness of breath: Secondary | ICD-10-CM | POA: Diagnosis present

## 2014-12-18 DIAGNOSIS — Z87891 Personal history of nicotine dependence: Secondary | ICD-10-CM

## 2014-12-18 DIAGNOSIS — R4182 Altered mental status, unspecified: Secondary | ICD-10-CM | POA: Diagnosis not present

## 2014-12-18 DIAGNOSIS — Z881 Allergy status to other antibiotic agents status: Secondary | ICD-10-CM

## 2014-12-18 DIAGNOSIS — Z89421 Acquired absence of other right toe(s): Secondary | ICD-10-CM | POA: Diagnosis not present

## 2014-12-18 DIAGNOSIS — R401 Stupor: Secondary | ICD-10-CM | POA: Diagnosis not present

## 2014-12-18 DIAGNOSIS — Z87440 Personal history of urinary (tract) infections: Secondary | ICD-10-CM | POA: Diagnosis not present

## 2014-12-18 DIAGNOSIS — R0682 Tachypnea, not elsewhere classified: Secondary | ICD-10-CM | POA: Diagnosis not present

## 2014-12-18 DIAGNOSIS — E780 Pure hypercholesterolemia: Secondary | ICD-10-CM | POA: Diagnosis not present

## 2014-12-18 HISTORY — DX: Major depressive disorder, single episode, unspecified: F32.9

## 2014-12-18 HISTORY — DX: Morbid (severe) obesity due to excess calories: E66.01

## 2014-12-18 HISTORY — DX: Other chronic pain: G89.29

## 2014-12-18 HISTORY — DX: Obstructive sleep apnea (adult) (pediatric): G47.33

## 2014-12-18 HISTORY — DX: Dorsalgia, unspecified: M54.9

## 2014-12-18 HISTORY — DX: Essential (primary) hypertension: I10

## 2014-12-18 HISTORY — DX: Depression, unspecified: F32.A

## 2014-12-18 HISTORY — DX: Urinary tract infection, site not specified: N39.0

## 2014-12-18 HISTORY — DX: Type 2 diabetes mellitus without complications: E11.9

## 2014-12-18 HISTORY — DX: Benign prostatic hyperplasia without lower urinary tract symptoms: N40.0

## 2014-12-18 HISTORY — DX: Osteomyelitis, unspecified: M86.9

## 2014-12-18 LAB — URINALYSIS COMPLETE WITH MICROSCOPIC (ARMC ONLY)
Bilirubin Urine: NEGATIVE
GLUCOSE, UA: 150 mg/dL — AB
Nitrite: POSITIVE — AB
PH: 6 (ref 5.0–8.0)
PROTEIN: 100 mg/dL — AB
Specific Gravity, Urine: 1.021 (ref 1.005–1.030)

## 2014-12-18 LAB — COMPREHENSIVE METABOLIC PANEL
ALT: 22 U/L (ref 17–63)
AST: 40 U/L (ref 15–41)
Albumin: 4 g/dL (ref 3.5–5.0)
Alkaline Phosphatase: 73 U/L (ref 38–126)
Anion gap: 15 (ref 5–15)
BUN: 20 mg/dL (ref 6–20)
CALCIUM: 9.3 mg/dL (ref 8.9–10.3)
CHLORIDE: 103 mmol/L (ref 101–111)
CO2: 21 mmol/L — AB (ref 22–32)
Creatinine, Ser: 1.18 mg/dL (ref 0.61–1.24)
GFR calc Af Amer: >60 mL/min (ref >60)
GFR calc non Af Amer: >60 mL/min (ref >60)
Glucose, Bld: 248 mg/dL — ABNORMAL HIGH (ref 65–99)
POTASSIUM: 4.1 mmol/L (ref 3.5–5.1)
Sodium: 139 mmol/L (ref 135–145)
Total Bilirubin: 0.8 mg/dL (ref 0.3–1.2)
Total Protein: 7.4 g/dL (ref 6.5–8.1)

## 2014-12-18 LAB — CBC WITH DIFFERENTIAL/PLATELET
BASOS ABS: 0 10*3/uL (ref 0–0.1)
Basophils Relative: 1 %
EOS ABS: 0 10*3/uL (ref 0–0.7)
EOS PCT: 0 %
HEMATOCRIT: 36.9 % — AB (ref 40.0–52.0)
Hemoglobin: 12.1 g/dL — ABNORMAL LOW (ref 13.0–18.0)
Lymphocytes Relative: 5 %
Lymphs Abs: 0.4 10*3/uL — ABNORMAL LOW (ref 1.0–3.6)
MCH: 25.7 pg — ABNORMAL LOW (ref 26.0–34.0)
MCHC: 32.7 g/dL (ref 32.0–36.0)
MCV: 78.6 fL — AB (ref 80.0–100.0)
MONO ABS: 0.1 10*3/uL — AB (ref 0.2–1.0)
Monocytes Relative: 1 %
NEUTROS ABS: 6.8 10*3/uL — AB (ref 1.4–6.5)
Neutrophils Relative %: 93 %
PLATELETS: 156 10*3/uL (ref 150–440)
RBC: 4.69 MIL/uL (ref 4.40–5.90)
RDW: 16.5 % — AB (ref 11.5–14.5)
WBC: 7.4 10*3/uL (ref 3.8–10.6)

## 2014-12-18 LAB — TROPONIN I
TROPONIN I: 2.25 ng/mL — AB (ref <0.031)
Troponin I: 0.17 ng/mL — ABNORMAL HIGH (ref <0.031)

## 2014-12-18 LAB — GLUCOSE, CAPILLARY
GLUCOSE-CAPILLARY: 277 mg/dL — AB (ref 65–99)
Glucose-Capillary: 263 mg/dL — ABNORMAL HIGH (ref 65–99)

## 2014-12-18 LAB — PROTIME-INR
INR: 1.13
Prothrombin Time: 14.7 seconds (ref 11.4–15.0)

## 2014-12-18 LAB — APTT: aPTT: 32 seconds (ref 24–36)

## 2014-12-18 LAB — LACTIC ACID, PLASMA
Lactic Acid, Venous: 5.7 mmol/L (ref 0.5–2.0)
Lactic Acid, Venous: 5.7 mmol/L (ref 0.5–2.0)

## 2014-12-18 MED ORDER — ONDANSETRON HCL 4 MG/2ML IJ SOLN: 4.0000 mg | Freq: Four times a day (QID) | INTRAMUSCULAR | Status: DC | PRN

## 2014-12-18 MED ORDER — INSULIN ASPART 100 UNIT/ML ~~LOC~~ SOLN
0.0000 [IU] | Freq: Three times a day (TID) | SUBCUTANEOUS | Status: DC
  Administered 2014-12-19: 4 [IU] via SUBCUTANEOUS
  Filled 2014-12-18: qty 4

## 2014-12-18 MED ORDER — AMLODIPINE BESYLATE 10 MG PO TABS
10.0000 mg | ORAL_TABLET | Freq: Every day | ORAL | Status: DC
  Administered 2014-12-19: 10 mg via ORAL
  Filled 2014-12-18: qty 1

## 2014-12-18 MED ORDER — FENOFIBRATE 54 MG PO TABS: 54.0000 mg | ORAL_TABLET | Freq: Every evening | ORAL | Status: DC

## 2014-12-18 MED ORDER — PIPERACILLIN-TAZOBACTAM 3.375 G IVPB 30 MIN
3.3750 g | Freq: Once | INTRAVENOUS | Status: AC
  Administered 2014-12-18: 3.375 g via INTRAVENOUS

## 2014-12-18 MED ORDER — BUPROPION HCL ER (XL) 150 MG PO TB24
150.0000 mg | ORAL_TABLET | Freq: Two times a day (BID) | ORAL | Status: DC
  Administered 2014-12-18 – 2014-12-19 (×2): 150 mg via ORAL
  Filled 2014-12-18 (×2): qty 1

## 2014-12-18 MED ORDER — LEVOFLOXACIN IN D5W 750 MG/150ML IV SOLN
750.0000 mg | INTRAVENOUS | Status: DC
  Administered 2014-12-19: 750 mg via INTRAVENOUS
  Filled 2014-12-18 (×2): qty 150

## 2014-12-18 MED ORDER — SODIUM CHLORIDE 0.9 % IV BOLUS (SEPSIS)
1000.0000 mL | INTRAVENOUS | Status: AC
  Administered 2014-12-18 (×2): 1000 mL via INTRAVENOUS

## 2014-12-18 MED ORDER — ACETAMINOPHEN 650 MG RE SUPP: 650.0000 mg | Freq: Four times a day (QID) | RECTAL | Status: DC | PRN

## 2014-12-18 MED ORDER — VANCOMYCIN HCL IN DEXTROSE 1-5 GM/200ML-% IV SOLN
1000.0000 mg | Freq: Once | INTRAVENOUS | Status: AC
  Administered 2014-12-18: 1000 mg via INTRAVENOUS
  Filled 2014-12-18: qty 200

## 2014-12-18 MED ORDER — DULOXETINE HCL 60 MG PO CPEP
90.0000 mg | ORAL_CAPSULE | Freq: Every day | ORAL | Status: DC
  Administered 2014-12-19: 90 mg via ORAL
  Filled 2014-12-18: qty 1

## 2014-12-18 MED ORDER — LIRAGLUTIDE 18 MG/3ML ~~LOC~~ SOPN: 0.6000 mg | PEN_INJECTOR | Freq: Every evening | SUBCUTANEOUS | Status: DC

## 2014-12-18 MED ORDER — OXCARBAZEPINE 300 MG PO TABS
300.0000 mg | ORAL_TABLET | Freq: Three times a day (TID) | ORAL | Status: DC
  Administered 2014-12-18 – 2014-12-19 (×2): 300 mg via ORAL
  Filled 2014-12-18 (×4): qty 1

## 2014-12-18 MED ORDER — TAMSULOSIN HCL 0.4 MG PO CAPS
0.4000 mg | ORAL_CAPSULE | Freq: Every day | ORAL | Status: DC
  Administered 2014-12-19: 0.4 mg via ORAL
  Filled 2014-12-18: qty 1

## 2014-12-18 MED ORDER — PREGABALIN 75 MG PO CAPS
200.0000 mg | ORAL_CAPSULE | Freq: Three times a day (TID) | ORAL | Status: DC
  Administered 2014-12-18 – 2014-12-19 (×2): 200 mg via ORAL
  Filled 2014-12-18 (×2): qty 1

## 2014-12-18 MED ORDER — INSULIN ASPART 100 UNIT/ML ~~LOC~~ SOLN
0.0000 [IU] | Freq: Every day | SUBCUTANEOUS | Status: DC
  Administered 2014-12-18: 3 [IU] via SUBCUTANEOUS
  Filled 2014-12-18: qty 3

## 2014-12-18 MED ORDER — HEPARIN BOLUS VIA INFUSION
4000.0000 [IU] | Freq: Once | INTRAVENOUS | Status: AC
  Administered 2014-12-18: 4000 [IU] via INTRAVENOUS
  Filled 2014-12-18: qty 4000

## 2014-12-18 MED ORDER — FLUTICASONE PROPIONATE 50 MCG/ACT NA SUSP
2.0000 | Freq: Every day | NASAL | Status: DC
  Administered 2014-12-19: 2 via NASAL
  Filled 2014-12-18: qty 16

## 2014-12-18 MED ORDER — SODIUM CHLORIDE 0.9 % IJ SOLN
3.0000 mL | Freq: Two times a day (BID) | INTRAMUSCULAR | Status: DC
  Administered 2014-12-18 – 2014-12-19 (×2): 3 mL via INTRAVENOUS

## 2014-12-18 MED ORDER — MONTELUKAST SODIUM 10 MG PO TABS
10.0000 mg | ORAL_TABLET | Freq: Every day | ORAL | Status: DC
  Administered 2014-12-19: 10 mg via ORAL
  Filled 2014-12-18: qty 1

## 2014-12-18 MED ORDER — ASPIRIN 81 MG PO CHEW
81.0000 mg | CHEWABLE_TABLET | Freq: Every day | ORAL | Status: DC
  Administered 2014-12-19: 81 mg via ORAL
  Filled 2014-12-18: qty 1

## 2014-12-18 MED ORDER — ONDANSETRON HCL 4 MG PO TABS: 4.0000 mg | ORAL_TABLET | Freq: Four times a day (QID) | ORAL | Status: DC | PRN

## 2014-12-18 MED ORDER — CLOPIDOGREL BISULFATE 75 MG PO TABS
75.0000 mg | ORAL_TABLET | Freq: Every day | ORAL | Status: DC
  Administered 2014-12-19: 75 mg via ORAL
  Filled 2014-12-18: qty 1

## 2014-12-18 MED ORDER — ACETAMINOPHEN 325 MG PO TABS: 650.0000 mg | ORAL_TABLET | Freq: Four times a day (QID) | ORAL | Status: DC | PRN

## 2014-12-18 MED ORDER — ASPIRIN-ACETAMINOPHEN-CAFFEINE 250-250-65 MG PO TABS
2.0000 | ORAL_TABLET | Freq: Four times a day (QID) | ORAL | Status: DC | PRN
  Administered 2014-12-19: 2 via ORAL
  Filled 2014-12-18 (×2): qty 2

## 2014-12-18 MED ORDER — INSULIN ASPART 100 UNIT/ML ~~LOC~~ SOLN: 2.0000 [IU] | Freq: Three times a day (TID) | SUBCUTANEOUS | Status: DC | PRN

## 2014-12-18 MED ORDER — HEPARIN (PORCINE) IN NACL 100-0.45 UNIT/ML-% IJ SOLN
1800.0000 [IU]/h | INTRAMUSCULAR | Status: DC
Start: 2014-12-18 — End: 2014-12-19
  Administered 2014-12-18: 1300 [IU]/h via INTRAVENOUS
  Administered 2014-12-19: 1800 [IU]/h via INTRAVENOUS
  Administered 2014-12-19: 1600 [IU]/h via INTRAVENOUS
  Filled 2014-12-18 (×4): qty 250

## 2014-12-18 MED ORDER — BENAZEPRIL HCL 20 MG PO TABS
40.0000 mg | ORAL_TABLET | Freq: Every day | ORAL | Status: DC
  Administered 2014-12-19: 40 mg via ORAL
  Filled 2014-12-18: qty 2

## 2014-12-18 MED ORDER — INSULIN GLARGINE 100 UNIT/ML ~~LOC~~ SOLN
41.0000 [IU] | Freq: Every day | SUBCUTANEOUS | Status: DC
  Administered 2014-12-18: 41 [IU] via SUBCUTANEOUS
  Filled 2014-12-18 (×2): qty 0.41

## 2014-12-18 MED ORDER — ENOXAPARIN SODIUM 40 MG/0.4ML ~~LOC~~ SOLN
40.0000 mg | Freq: Two times a day (BID) | SUBCUTANEOUS | Status: DC
Start: 2014-12-18 — End: 2014-12-18
  Filled 2014-12-18: qty 0.4

## 2014-12-18 MED ORDER — PANTOPRAZOLE SODIUM 40 MG PO TBEC
40.0000 mg | DELAYED_RELEASE_TABLET | Freq: Every day | ORAL | Status: DC
  Administered 2014-12-19: 40 mg via ORAL
  Filled 2014-12-18: qty 1

## 2014-12-18 MED ORDER — HYDROCHLOROTHIAZIDE 12.5 MG PO CAPS
12.5000 mg | ORAL_CAPSULE | Freq: Every day | ORAL | Status: DC
  Administered 2014-12-19: 12.5 mg via ORAL
  Filled 2014-12-18: qty 1

## 2014-12-18 NOTE — ED Notes (Signed)
Cardiologist at bedside.  

## 2014-12-18 NOTE — ED Notes (Signed)
Called in to [place] code sepsis per [employer], [phone]

## 2014-12-18 NOTE — H&P (Signed)
[place] [place] NAME: Edan Juday    MR#:  [id number]  DATE OF BIRTH:  05-20-1950  DATE OF ADMISSION:  12/18/2014  PRIMARY CARE PHYSICIAN: Lavera Guise, MD   REQUESTING/REFERRING PHYSICIAN: Dr. Larae Grooms  CHIEF COMPLAINT:   Chief Complaint  Patient presents with  . Code Sepsis   Altered mental status, sepsis  HISTORY OF PRESENT ILLNESS:  Coulton Schlink  is a 64 y.o. male with a known history of diabetes, hypertension, BPH, history of osteomyelitis, morbid obesity, obstructive sleep apnea, depression, history of recurrent UTIs, who presented to the hospital due to altered mental status, fall and noted to have a fever of 103. Patient was also incidentally noted to have a troponin of 0.17. Patient says that he was trying to get out of his bed early this morning and he stepped out of his mattress to the floor. He was unable to get himself up from the floor and therefore crawled outside his room and got attention of his sister. He was then brought to the hospital for further evaluation. As per the sister patient has been having worsening weakness and frequent falls over the past few weeks. Patient also has had a very strong smelling urine over the past few days. When patient presented to the hospital he was diaphoretic and noted to have a fever of 103 and diagnosed with sepsis likely secondary to UTI and hospitalist services were contacted further treatment and evaluation.  PAST MEDICAL HISTORY:   Past Medical History  Diagnosis Date  . Diabetes   . Hypertension   . BPH (benign prostatic hyperplasia)   . Osteomyelitis of foot   . Morbid obesity   . Obstructive sleep apnea   . Chronic back pain   . Depression   . UTI (lower urinary tract infection)     PAST SURGICAL HISTORY:   Past Surgical History  Procedure Laterality Date  . Pain stimulator    . Right toe amputation      SOCIAL HISTORY:   History  Substance Use  Topics  . Smoking status: Former Smoker    Types: Cigarettes  . Smokeless tobacco: Not on file  . Alcohol Use: No    FAMILY HISTORY:   Family History  Problem Relation Age of Onset  . Breast cancer Mother   . Lung cancer Father     DRUG ALLERGIES:   Allergies  Allergen Reactions  . Amoxicillin Shortness Of Breath and Itching  . Aspirin Itching and Other (See Comments)    Only in large doses  . Crestor [Rosuvastatin Calcium] Other (See Comments)    Pt states that it caused his arms and legs to "lock up".  . Hydralazine Other (See Comments)    Reaction:  Cramping   . Penicillins Itching  . Sulfa Antibiotics Itching  . Yellow Dyes (Non-Tartrazine) Itching    REVIEW OF SYSTEMS:   Review of Systems  Constitutional: Positive for diaphoresis. Negative for fever and weight loss.  HENT: Negative for congestion, nosebleeds and tinnitus.   Eyes: Negative for blurred vision, double vision and redness.  Respiratory: Negative for cough, hemoptysis and shortness of breath.   Cardiovascular: Negative for chest pain, orthopnea, leg swelling and PND.  Gastrointestinal: Negative for nausea, vomiting, abdominal pain, diarrhea and melena.  Genitourinary: Negative for dysuria, urgency and hematuria.  Musculoskeletal: Negative for joint pain and falls.  Neurological: Positive for weakness (genearlized). Negative for dizziness, tingling, sensory change, focal weakness, seizures  and headaches.  Endo/Heme/Allergies: Negative for polydipsia. Does not bruise/bleed easily.  Psychiatric/Behavioral: Negative for depression and memory loss. The patient is not nervous/anxious.     MEDICATIONS AT HOME:   Prior to Admission medications   Medication Sig Start Date End Date Taking? Authorizing Provider  amLODipine (NORVASC) 10 MG tablet Take 10 mg by mouth daily.   Yes Historical Provider, MD  aspirin 81 MG chewable tablet Chew 81 mg by mouth daily.   Yes Historical Provider, MD   aspirin-acetaminophen-caffeine (EXCEDRIN MIGRAINE) [id number] MG per tablet Take 2 tablets by mouth every 6 (six) hours as needed for headache.   Yes Historical Provider, MD  benazepril (LOTENSIN) 40 MG tablet Take 40 mg by mouth daily.   Yes Historical Provider, MD  buPROPion (WELLBUTRIN XL) 150 MG 24 hr tablet Take 150 mg by mouth 2 (two) times daily.   Yes Historical Provider, MD  clopidogrel (PLAVIX) 75 MG tablet Take 75 mg by mouth daily.   Yes Historical Provider, MD  DULoxetine (CYMBALTA) 30 MG capsule Take 90 mg by mouth daily.   Yes Historical Provider, MD  fenofibrate 54 MG tablet Take 54 mg by mouth every evening.   Yes Historical Provider, MD  fluticasone (FLONASE) 50 MCG/ACT nasal spray Place 2 sprays into both nostrils daily.   Yes Historical Provider, MD  glipiZIDE-metformin (METAGLIP) 5-500 MG per tablet Take 2 tablets by mouth 2 (two) times daily.   Yes Historical Provider, MD  hydrochlorothiazide (MICROZIDE) 12.5 MG capsule Take 12.5 mg by mouth daily.   Yes Historical Provider, MD  insulin aspart (NOVOLOG) 100 UNIT/ML injection Inject 2-10 Units into the skin 3 (three) times daily with meals as needed for high blood sugar. Pt uses as needed per sliding scale.   Yes Historical Provider, MD  insulin glargine (LANTUS) 100 UNIT/ML injection Inject 41 Units into the skin at bedtime.   Yes Historical Provider, MD  Liraglutide (VICTOZA) 18 MG/3ML SOPN Inject 0.6 mg into the skin every evening.    Yes Historical Provider, MD  montelukast (SINGULAIR) 10 MG tablet Take 10 mg by mouth daily.   Yes Historical Provider, MD  Oxcarbazepine (TRILEPTAL) 300 MG tablet Take 300 mg by mouth 3 (three) times daily.   Yes Historical Provider, MD  pantoprazole (PROTONIX) 40 MG tablet Take 40 mg by mouth daily.   Yes Historical Provider, MD  pregabalin (LYRICA) 200 MG capsule Take 200 mg by mouth 3 (three) times daily.   Yes Historical Provider, MD  tamsulosin (FLOMAX) 0.4 MG CAPS capsule Take 0.4 mg by  mouth daily.   Yes Historical Provider, MD      VITAL SIGNS:  Blood pressure 175/100, pulse 90, temperature 103.2 F (39.6 C), temperature source Oral, resp. rate 18, height 5\' 9"  (1.753 m), weight 136.079 kg (300 lb), SpO2 97 %.  PHYSICAL EXAMINATION:  Physical Exam  GENERAL:  64 y.o.-year-old morbidly obese patient lying in the bed with no acute distress.  EYES: Pupils equal, round, reactive to light and accommodation. No scleral icterus. Extraocular muscles intact.  HEENT: Head atraumatic, normocephalic. Oropharynx and nasopharynx clear. No oropharyngeal erythema, moist oral mucosa  NECK:  Supple, no jugular venous distention. No thyroid enlargement, no tenderness.  LUNGS: Normal breath sounds bilaterally, no wheezing, rales, rhonchi. No use of accessory muscles of respiration.  CARDIOVASCULAR: S1, S2 RRR. No murmurs, rubs, gallops, clicks.  ABDOMEN: Soft, nontender, nondistended. Bowel sounds present. No organomegaly or mass.  EXTREMITIES: +1 edema bilaterally, no cyanosis, or clubbing. + 2 pedal &  radial pulses b/l.   NEUROLOGIC: Cranial nerves II through XII are intact. No focal Motor or sensory deficits appreciated b/l PSYCHIATRIC: The patient is alert and oriented x 3. Good affect.  SKIN: No obvious rash, lesion, or ulcer.   LABORATORY PANEL:   CBC  Recent Labs Lab 12/18/14 1511  WBC 7.4  HGB 12.1*  HCT 36.9*  PLT 156   ------------------------------------------------------------------------------------------------------------------  Chemistries   Recent Labs Lab 12/18/14 1511  NA 139  K 4.1  CL 103  CO2 21*  GLUCOSE 248*  BUN 20  CREATININE 1.18  CALCIUM 9.3  AST 40  ALT 22  ALKPHOS 73  BILITOT 0.8   ------------------------------------------------------------------------------------------------------------------  Cardiac Enzymes  Recent Labs Lab 12/18/14 1511  TROPONINI 0.17*    ------------------------------------------------------------------------------------------------------------------  RADIOLOGY:  Dg Pelvis 1-2 Views  12/18/2014   CLINICAL DATA:  Fall  EXAM: PELVIS - 1-2 VIEW  COMPARISON:  None.  FINDINGS: Two frontal view of the pelvis submitted. She no displaced fracture or subluxation. No radiopaque foreign body.  IMPRESSION: Negative.   Electronically Signed   By: Lahoma Crocker M.D.   On: 12/18/2014 16:26   Ct Head Wo Contrast  12/18/2014   CLINICAL DATA:  Found down, altered mental status  EXAM: CT HEAD WITHOUT CONTRAST  CT CERVICAL SPINE WITHOUT CONTRAST  TECHNIQUE: Multidetector CT imaging of the head and cervical spine was performed following the standard protocol without intravenous contrast. Multiplanar CT image reconstructions of the cervical spine were also generated.  COMPARISON:  Head CT 11/25/2013  FINDINGS: CT HEAD FINDINGS  No acute hemorrhage, infarct, or mass lesion is identified. No midline shift. Ventricles are normal in size. Orbits and paranasal sinuses are unremarkable. No skull fracture.  CT CERVICAL SPINE FINDINGS  Extensive streak artifact due to patient body habitus. Vertebral body heights are grossly maintained. Mild bilateral C5-C6 neural foraminal narrowing by disc degenerative change. No fracture or dislocation.  IMPRESSION: No acute intracranial abnormality.  No cervical spine fracture or dislocation.   Electronically Signed   By: Conchita Paris M.D.   On: 12/18/2014 16:34   Ct Cervical Spine Wo Contrast  12/18/2014   CLINICAL DATA:  Found down, altered mental status  EXAM: CT HEAD WITHOUT CONTRAST  CT CERVICAL SPINE WITHOUT CONTRAST  TECHNIQUE: Multidetector CT imaging of the head and cervical spine was performed following the standard protocol without intravenous contrast. Multiplanar CT image reconstructions of the cervical spine were also generated.  COMPARISON:  Head CT 11/25/2013  FINDINGS: CT HEAD FINDINGS  No acute hemorrhage,  infarct, or mass lesion is identified. No midline shift. Ventricles are normal in size. Orbits and paranasal sinuses are unremarkable. No skull fracture.  CT CERVICAL SPINE FINDINGS  Extensive streak artifact due to patient body habitus. Vertebral body heights are grossly maintained. Mild bilateral C5-C6 neural foraminal narrowing by disc degenerative change. No fracture or dislocation.  IMPRESSION: No acute intracranial abnormality.  No cervical spine fracture or dislocation.   Electronically Signed   By: Conchita Paris M.D.   On: 12/18/2014 16:34   Dg Chest Port 1 View  12/18/2014   CLINICAL DATA:  Found on the floor by sister. Patient's smells of urine. Skin is hot and diaphoretic. Increased respiratory rate.  EXAM: PORTABLE CHEST - 1 VIEW  COMPARISON:  02/06/2014  FINDINGS: The heart is enlarged. There is mild pulmonary vascular congestion but no overt edema. There are no focal consolidations or pleural effusions.  IMPRESSION: Cardiomegaly.   Electronically Signed   By: Benjamine Mola  Owens Shark M.D.   On: 12/18/2014 16:08     IMPRESSION AND PLAN:   64 year old male with past medical history of hypertension, diabetes, obstructive sleep apnea, morbid obesity, history of osteomyelitis, history of recurrent UTIs, who presented to the hospital due to a fall and also noted to have an elevated troponin and febrile with a temperature 103.  #1 sepsis-patient presented with fever 102, tachycardia with heart rates in the 120s and also noted to have abnormal urinalysis. -Continue IV fluids, patient got Vanco and Zosyn in the ER but I will only give IV Levaquin for now. -Follow blood, urine cultures and follow hemodynamics.  #2 elevated troponin-I suspect this is in the setting of demand ischemia with underlying sepsis. Patient does have risk factors for coronary disease and therefore cardiology has been consulted. Patient was seen by Dr. Humphrey Rolls and he recommends continuing heparin nomogram and follow serial cardiac  markers. -We'll check a two-dimensional echo. Continue aspirin, Plavix, fenofibrate -Await further cardiology input regarding management.  #3 frequent falls/weakness-this is probably related to generalized deconditioning and morbid obesity. -I will get a physical therapy consult to assess his mobility.  #4 type 2 diabetes without complication-continue Lantus, NovoLog with meals and sliding scale insulin coverage. Follow blood sugars. Next  #5 hypertension-continue hydrochlorothiazide, benazepril, Norvasc.   #6 GERD-continue Protonix.  #7 obstructive sleep apnea-continue CPAP at bedtime.  #8 depression-continue Wellbutrin.    All the records are reviewed and case discussed with ED provider. Management plans discussed with the patient, family and they are in agreement.  CODE STATUS: Full  TOTAL TIME TAKING CARE OF THIS PATIENT: 50 minutes.    Henreitta Leber M.D on 12/18/2014 at 5:50 PM  Between 7am to 6pm - Pager - [phone]  After 6pm go to [email or web address] - password [email or web address] [facility]  Office  [phone]  CC: Primary care physician; Lavera Guise, MD

## 2014-12-18 NOTE — ED Provider Notes (Addendum)
[facility] Emergency Department Provider Note  ____________________________________________  Time seen: Approximately 3:15 PM  I have reviewed the triage vital signs and the nursing notes.   HISTORY  Chief Complaint Code Sepsis    HPI Victor Wise is a 63 y.o. male with chronic low back pain and multiple urinary tract infections was found down at home this afternoon by his sister. The patient is unable to give further history at this time.   Diabetes, chronic low back pain.  There are no active problems to display for this patient.   Spinal nerve stimulator surgically inserted.  No current outpatient prescriptions on file.  Allergies Amoxicillin and Aspirin  No family history on file.  Social History History  Substance Use Topics  . Smoking status: Not on file  . Smokeless tobacco: Not on file  . Alcohol Use: Not on file    Review of Systems  Caveat due to altered mental status.  ____________________________________________   PHYSICAL EXAM:  VITAL SIGNS: ED Triage Vitals  Enc Vitals Group     BP 12/18/14 1513 151/88 mmHg     Pulse Rate 12/18/14 1513 120     Resp 12/18/14 1513 32     Temp 12/18/14 1513 103.2 F (39.6 C)     Temp Source 12/18/14 1513 Oral     SpO2 12/18/14 1513 87 %     Weight --      Height --      Head Cir --      Peak Flow --      Pain Score --      Pain Loc --      Pain Edu? --      Excl. in [place]? --     Constitutional: Alert but needing to be asked questions several times to respond to questions. Only responding to some questions. Eyes: Conjunctivae are normal. PERRL. EOMI. Head: Atraumatic. Nose: No congestion/rhinnorhea. Mouth/Throat: Mucous membranes dry.  Oropharynx non-erythematous. Neck: No stridor.   Cardiovascular: Tachycardic, regular rhythm. Grossly normal heart sounds.  Good peripheral circulation. Respiratory: Tachypneic.  No retractions. Lungs CTAB. Gastrointestinal: Soft and  nontender. No distention. No abdominal bruits. No CVA tenderness. Musculoskeletal: No lower extremity tenderness nor edema.  No joint effusions. Neurologic:  Normal speech. No gross focal neurologic deficits are appreciated. No gait instability. Skin:  Skin is warm, dry and intact. No rash noted.   ____________________________________________   LABS (all labs ordered are listed, but only abnormal results are displayed)  Labs Reviewed  COMPREHENSIVE METABOLIC PANEL - Abnormal; Notable for the following:    CO2 21 (*)    Glucose, Bld 248 (*)    All other components within normal limits  CBC WITH DIFFERENTIAL/PLATELET - Abnormal; Notable for the following:    Hemoglobin 12.1 (*)    HCT 36.9 (*)    MCV 78.6 (*)    MCH 25.7 (*)    RDW 16.5 (*)    Neutro Abs 6.8 (*)    Lymphs Abs 0.4 (*)    Monocytes Absolute 0.1 (*)    All other components within normal limits  LACTIC ACID, PLASMA - Abnormal; Notable for the following:    Lactic Acid, Venous 5.7 (*)    All other components within normal limits  TROPONIN I - Abnormal; Notable for the following:    Troponin I 0.17 (*)    All other components within normal limits  CULTURE, BLOOD (ROUTINE X 2)  CULTURE, BLOOD (ROUTINE X 2)  URINE CULTURE  LACTIC ACID, PLASMA  URINALYSIS COMPLETEWITH MICROSCOPIC ([facility] ONLY)   ____________________________________________  EKG  2 EKGs with interference with possible nursing later spikes. One reading acute MI. We'll try one more to see if we can get a better baseline. There is baseline disturbance, likely related to patient movement.  ED ECG REPORT I, Doran Stabler, the attending physician, personally viewed and interpreted this ECG.   Date: 12/18/2014  EKG Time: 1543  Rate: 104  Rhythm: sinus tachycardia  Axis: Left axis deviation  Intervals:Incomplete right bundle branch block  ST&T Change: 1-2 mm ST elevation in lead 3. No reciprocal depressions. No abnormal T-wave inversions. Poor  baseline likely related to patient being tachypneic.  ____________________________________________  RADIOLOGY  No acute finding on the CAT scans of the brain and cervical spine. No acute disease on plain films of the chest and pelvis. I personally reviewed the chest film. ____________________________________________   PROCEDURES  CRITICAL CARE Performed by: Doran Stabler   Total critical care time: 35 minutes  Critical care time was exclusive of separately billable procedures and treating other patients.  Critical care was necessary to treat or prevent imminent or life-threatening deterioration.  Critical care was time spent personally by me on the following activities: development of treatment plan with patient and/or surrogate as well as nursing, discussions with consultants, evaluation of patient's response to treatment, examination of patient, obtaining history from patient or surrogate, ordering and performing treatments and interventions, ordering and review of laboratory studies, ordering and review of radiographic studies, pulse oximetry and re-evaluation of patient's condition.   ____________________________________________   INITIAL IMPRESSION / ASSESSMENT AND PLAN / ED COURSE  Pertinent labs & imaging results that were available during my care of the patient were reviewed by me and considered in my medical decision making (see chart for details).  Sepsis protocol initiated. Code sepsis called.  ----------------------------------------- 3:57 PM on 12/18/2014 -----------------------------------------  Patient is chest pain-free. Has not cleaned of chest pain at all throughout his stay in the emergency department. I discussed the case with Dr. Humphrey Rolls of cardiology and he reviewed the EKG. He says that in the setting of no chest pain he recommends a stat echo. He will see the patient to consult on the emergency  department.  ----------------------------------------- 4:46 PM on 12/18/2014 -----------------------------------------  Dr. Yancey Flemings evaluated the patient the bedside as well as reviewed the patient's EKGs. He does not think the patient should be emergently sent to the catheterization lab for STEMI. The patient continues to be chest pain-free. There is no old EKG for comparison, however the patient says that he always has an abnormal EKG. Troponin leak to 0.17, possibly related to demand. ____________________________________________   FINAL CLINICAL IMPRESSION(S) / ED DIAGNOSES  Sepsis likely secondary to urinary tract infection. Acute non-STEMI.    Victor Pyo, MD 12/18/14 [phone]  Signed out to Dr. Anselm Jungling.  Victor Pyo, MD 12/18/14 [phone]

## 2014-12-18 NOTE — Progress Notes (Signed)
Victor Wise is a 64 y.o. male  [id number]  Primary Cardiologist:Taiyana Kissler ,[place] Reason for Consultation: Elevated troponin and abnormal EKG  HPI: This is a 64 year old white male with a past medical history of  COPD sleep apnea presented to the emergency room after falling down. Patient denies any chest pain or shortness of breath. His EKG showed ST elevation in the inferior and anterior leads and I was asked to evaluate the patient right away. He also had mildly elevated troponin. I really don't think that down patient is having STEMI.   Past medical history is significant for hypertension and non-insulin-dependent diabetes sleep apnea and hyperlipidemia. Patient also has a history of back pain and has a stimulator for the back. No prior history of myocardial infarction or coronary artery disease. Patient does have sleep apnea and is compliant with his CPAP.   No past medical history on file.   (Not in a hospital admission)      Infusions: . piperacillin-tazobactam    . sodium chloride 1,000 mL (12/18/14 1633)  . vancomycin 1,000 mg (12/18/14 1633)    Allergies not on file  History   Social History  . Marital Status: Divorced    Spouse Name: N/A  . Number of Children: N/A  . Years of Education: N/A   Occupational History  . Not on file.   Social History Main Topics  . Smoking status: Not on file  . Smokeless tobacco: Not on file  . Alcohol Use: Not on file  . Drug Use: Not on file  . Sexual Activity: Not on file   Other Topics Concern  . Not on file   Social History Narrative  . No narrative on file    No family history on file.  PHYSICAL EXAM: Filed Vitals:   12/18/14 1530  BP: 164/84  Pulse:   Temp:   Resp:      Intake/Output Summary (Last 24 hours) at 12/18/14 1639 Last data filed at 12/18/14 1633  Gross per 24 hour  Intake   1000 ml  Output      0 ml  Net   1000 ml    General:  Well appearing. No respiratory difficulty HEENT:  normal Neck: supple. no JVD. Carotids 2+ bilat; no bruits. No lymphadenopathy or thryomegaly appreciated. Cor: PMI nondisplaced. Regular rate & rhythm. No rubs, gallops or murmurs. Lungs: clear Abdomen: soft, nontender, nondistended. No hepatosplenomegaly. No bruits or masses. Good bowel sounds. Extremities: no cyanosis, clubbing, rash, edema Neuro: alert & oriented x 3, cranial nerves grossly intact. moves all 4 extremities w/o difficulty. Affect pleasanECG reveals sinus tachycardia 104 bpm with ST elevation in lead 3 about 1 mm and 2 mm ST elevation in lead V4 to V6. There is no heat EKG to compare with but it seems like it's nonspecific ST-T changes. Advise getting echocardiogram to see the wall motion.   Results for orders placed or performed during the hospital encounter of 12/18/14 (from the past 24 hour(s))  Comprehensive metabolic panel     Status: Abnormal   Collection Time: 12/18/14  3:11 PM  Result Value Ref Range   Sodium 139 135 - 145 mmol/L   Potassium 4.1 3.5 - 5.1 mmol/L   Chloride 103 101 - 111 mmol/L   CO2 21 (L) 22 - 32 mmol/L   Glucose, Bld 248 (H) 65 - 99 mg/dL   BUN 20 6 - 20 mg/dL   Creatinine, Ser 1.18 0.61 - 1.24 mg/dL   Calcium 9.3  8.9 - 10.3 mg/dL   Total Protein 7.4 6.5 - 8.1 g/dL   Albumin 4.0 3.5 - 5.0 g/dL   AST 40 15 - 41 U/L   ALT 22 17 - 63 U/L   Alkaline Phosphatase 73 38 - 126 U/L   Total Bilirubin 0.8 0.3 - 1.2 mg/dL   GFR calc non Af Amer >60 >60 mL/min   GFR calc Af Amer >60 >60 mL/min   Anion gap 15 5 - 15  CBC WITH DIFFERENTIAL     Status: Abnormal   Collection Time: 12/18/14  3:11 PM  Result Value Ref Range   WBC 7.4 3.8 - 10.6 K/uL   RBC 4.69 4.40 - 5.90 MIL/uL   Hemoglobin 12.1 (L) 13.0 - 18.0 g/dL   HCT 36.9 (L) 40.0 - 52.0 %   MCV 78.6 (L) 80.0 - 100.0 fL   MCH 25.7 (L) 26.0 - 34.0 pg   MCHC 32.7 32.0 - 36.0 g/dL   RDW 16.5 (H) 11.5 - 14.5 %   Platelets 156 150 - 440 K/uL   Neutrophils Relative % 93 %   Neutro Abs 6.8 (H) 1.4 -  6.5 K/uL   Lymphocytes Relative 5 %   Lymphs Abs 0.4 (L) 1.0 - 3.6 K/uL   Monocytes Relative 1 %   Monocytes Absolute 0.1 (L) 0.2 - 1.0 K/uL   Eosinophils Relative 0 %   Eosinophils Absolute 0.0 0 - 0.7 K/uL   Basophils Relative 1 %   Basophils Absolute 0.0 0 - 0.1 K/uL  Lactic acid, plasma     Status: Abnormal   Collection Time: 12/18/14  3:11 PM  Result Value Ref Range   Lactic Acid, Venous 5.7 (HH) 0.5 - 2.0 mmol/L  Troponin I     Status: Abnormal   Collection Time: 12/18/14  3:11 PM  Result Value Ref Range   Troponin I 0.17 (H) <0.031 ng/mL   Dg Pelvis 1-2 Views  12/18/2014   CLINICAL DATA:  Fall  EXAM: PELVIS - 1-2 VIEW  COMPARISON:  None.  FINDINGS: Two frontal view of the pelvis submitted. She no displaced fracture or subluxation. No radiopaque foreign body.  IMPRESSION: Negative.   Electronically Signed   By: Lahoma Crocker M.D.   On: 12/18/2014 16:26   Ct Head Wo Contrast  12/18/2014   CLINICAL DATA:  Found down, altered mental status  EXAM: CT HEAD WITHOUT CONTRAST  CT CERVICAL SPINE WITHOUT CONTRAST  TECHNIQUE: Multidetector CT imaging of the head and cervical spine was performed following the standard protocol without intravenous contrast. Multiplanar CT image reconstructions of the cervical spine were also generated.  COMPARISON:  Head CT 11/25/2013  FINDINGS: CT HEAD FINDINGS  No acute hemorrhage, infarct, or mass lesion is identified. No midline shift. Ventricles are normal in size. Orbits and paranasal sinuses are unremarkable. No skull fracture.  CT CERVICAL SPINE FINDINGS  Extensive streak artifact due to patient body habitus. Vertebral body heights are grossly maintained. Mild bilateral C5-C6 neural foraminal narrowing by disc degenerative change. No fracture or dislocation.  IMPRESSION: No acute intracranial abnormality.  No cervical spine fracture or dislocation.   Electronically Signed   By: Conchita Paris M.D.   On: 12/18/2014 16:34   Ct Cervical Spine Wo  Contrast  12/18/2014   CLINICAL DATA:  Found down, altered mental status  EXAM: CT HEAD WITHOUT CONTRAST  CT CERVICAL SPINE WITHOUT CONTRAST  TECHNIQUE: Multidetector CT imaging of the head and cervical spine was performed following the standard protocol without  intravenous contrast. Multiplanar CT image reconstructions of the cervical spine were also generated.  COMPARISON:  Head CT 11/25/2013  FINDINGS: CT HEAD FINDINGS  No acute hemorrhage, infarct, or mass lesion is identified. No midline shift. Ventricles are normal in size. Orbits and paranasal sinuses are unremarkable. No skull fracture.  CT CERVICAL SPINE FINDINGS  Extensive streak artifact due to patient body habitus. Vertebral body heights are grossly maintained. Mild bilateral C5-C6 neural foraminal narrowing by disc degenerative change. No fracture or dislocation.  IMPRESSION: No acute intracranial abnormality.  No cervical spine fracture or dislocation.   Electronically Signed   By: Conchita Paris M.D.   On: 12/18/2014 16:34   Dg Chest Port 1 View  12/18/2014   CLINICAL DATA:  Found on the floor by sister. Patient's smells of urine. Skin is hot and diaphoretic. Increased respiratory rate.  EXAM: PORTABLE CHEST - 1 VIEW  COMPARISON:  02/06/2014  FINDINGS: The heart is enlarged. There is mild pulmonary vascular congestion but no overt edema. There are no focal consolidations or pleural effusions.  IMPRESSION: Cardiomegaly.   Electronically Signed   By: Nolon Nations M.D.   On: 12/18/2014 16:08     ASSESSMENT AND PLAN: Abnormal EKG with mildly elevated troponin, but patient denies any chest pain or shortness of breath. Patient apparently fell out and has urosepsis. EKG has ST elevation in the anterolateral and lead 3 but no EKG from prior admissions as available. Advised that an echocardiogram to rule out wall motion abnormalities and rule out MI. Agree with giving heparin at this time.   Leonard Feigel A

## 2014-12-18 NOTE — ED Notes (Signed)
Pt brought in from home after being found in floor by sister, pt smells strongly of urine, skin hot and diaphoretic, pt with increased RR.

## 2014-12-18 NOTE — ED Notes (Signed)
Pr currently awake and alert, denies any complaints.

## 2014-12-18 NOTE — Progress Notes (Addendum)
ANTICOAGULATION CONSULT NOTE - Initial Consult  Pharmacy Consult for Heparin Indication: chest pain/ACS  Patient Measurements: Height: 5\' 9"  (175.3 cm) Weight: 300 lb (136.079 kg) IBW/kg (Calculated) : 70.7 Heparin Dosing Weight:  102.7 kg   Vital Signs: Temp: 103.2 F (39.6 C) (08/04 1513) Temp Source: Oral (08/04 1513) BP: 174/118 mmHg (08/04 1650) Pulse Rate: 96 (08/04 1650)  Labs:  Recent Labs  12/18/14 1511  HGB 12.1*  HCT 36.9*  PLT 156  CREATININE 1.18  TROPONINI 0.17*    Estimated Creatinine Clearance: 87.8 mL/min (by C-G formula based on Cr of 1.18).   Medical History: No past medical history on file.  Medications:   (Not in a hospital admission)  Assessment: 64 year old male admitted with ACS/NSTEMI CrCl = 87.8 ml/min No prior anticoagulation noted.  Goal of Therapy:  Heparin level 0.3-0.7 units/ml Monitor platelets by anticoagulation protocol: Yes   Plan:  Will order baseline aPTT and PT/INR.  Give 4000 units bolus x 1 Start heparin infusion at 1300 units/hr. Will draw 1st HL 6 hrs after start of drip on 8/5 @ 00:00.   Jannessa Ogden D 12/18/2014,5:11 PM

## 2014-12-18 NOTE — Progress Notes (Signed)
ANTICOAGULATION CONSULT NOTE - Initial Consult  Pharmacy Consult for Lovenox Indication: VTE prophylaxis  Allergies  Allergen Reactions  . Amoxicillin Shortness Of Breath and Itching  . Aspirin Itching and Other (See Comments)    Only in large doses  . Crestor [Rosuvastatin Calcium] Other (See Comments)    Pt states that it caused his arms and legs to "lock up".  . Hydralazine Other (See Comments)    Reaction:  Cramping   . Penicillins Itching  . Sulfa Antibiotics Itching  . Yellow Dyes (Non-Tartrazine) Itching    Patient Measurements: Height: 5\' 9"  (175.3 cm) Weight: 300 lb (136.079 kg) IBW/kg (Calculated) : 70.7 Heparin Dosing Weight:   Vital Signs: Temp: 98.1 F (36.7 C) (08/04 1830) Temp Source: Oral (08/04 1830) BP: 145/64 mmHg (08/04 1830) Pulse Rate: 89 (08/04 1830)  Labs:  Recent Labs  12/18/14 1511  HGB 12.1*  HCT 36.9*  PLT 156  APTT 32  LABPROT 14.7  INR 1.13  CREATININE 1.18  TROPONINI 2.25*  0.17*    Estimated Creatinine Clearance: 87.8 mL/min (by C-G formula based on Cr of 1.18).   Medical History: Past Medical History  Diagnosis Date  . Diabetes   . Hypertension   . BPH (benign prostatic hyperplasia)   . Osteomyelitis of foot   . Morbid obesity   . Obstructive sleep apnea   . Chronic back pain   . Depression   . UTI (lower urinary tract infection)     Medications:  Prescriptions prior to admission  Medication Sig Dispense Refill Last Dose  . amLODipine (NORVASC) 10 MG tablet Take 10 mg by mouth daily.   unknown at unknown  . aspirin 81 MG chewable tablet Chew 81 mg by mouth daily.   unknown at unknown  . aspirin-acetaminophen-caffeine (EXCEDRIN MIGRAINE) 250-250-65 MG per tablet Take 2 tablets by mouth every 6 (six) hours as needed for headache.   PRN at PRN  . benazepril (LOTENSIN) 40 MG tablet Take 40 mg by mouth daily.   unknown at unknown  . buPROPion (WELLBUTRIN XL) 150 MG 24 hr tablet Take 150 mg by mouth 2 (two) times  daily.   unknown at unknown  . clopidogrel (PLAVIX) 75 MG tablet Take 75 mg by mouth daily.   unknown at unknown  . DULoxetine (CYMBALTA) 30 MG capsule Take 90 mg by mouth daily.   unknown at unknown  . fenofibrate 54 MG tablet Take 54 mg by mouth every evening.   unknown at unknown  . fluticasone (FLONASE) 50 MCG/ACT nasal spray Place 2 sprays into both nostrils daily.   unknown at unknown  . glipiZIDE-metformin (METAGLIP) 5-500 MG per tablet Take 2 tablets by mouth 2 (two) times daily.   unknown at unknown  . hydrochlorothiazide (MICROZIDE) 12.5 MG capsule Take 12.5 mg by mouth daily.   unknown at unknown  . insulin aspart (NOVOLOG) 100 UNIT/ML injection Inject 2-10 Units into the skin 3 (three) times daily with meals as needed for high blood sugar. Pt uses as needed per sliding scale.   unknown at unknown  . insulin glargine (LANTUS) 100 UNIT/ML injection Inject 41 Units into the skin at bedtime.   unknown at unknown  . Liraglutide (VICTOZA) 18 MG/3ML SOPN Inject 0.6 mg into the skin every evening.    unknown at unknown  . montelukast (SINGULAIR) 10 MG tablet Take 10 mg by mouth daily.   unknown at unknown  . Oxcarbazepine (TRILEPTAL) 300 MG tablet Take 300 mg by mouth 3 (three) times  daily.   unknown at unknown  . pantoprazole (PROTONIX) 40 MG tablet Take 40 mg by mouth daily.   unknown at unknown  . pregabalin (LYRICA) 200 MG capsule Take 200 mg by mouth 3 (three) times daily.   unknown at unknown  . tamsulosin (FLOMAX) 0.4 MG CAPS capsule Take 0.4 mg by mouth daily.   unknown at unknown    Assessment: CrCl = 87.8 ml/min Wt = 136.1 kg BMI = 44.4   Goal of Therapy:  DVT prophylaxis   Plan:  Lovenox 40 mg SQ Q24H originally ordered. Will adjust dose to lovenox 40 mg SQ Q12H based on BMI > 40 .   Kameka Whan D 12/18/2014,7:10 PM

## 2014-12-19 ENCOUNTER — Encounter
Admission: EM | Disposition: A | Discharge status: Other Acute Inpatient Hospital | Payer: Self-pay | Source: Home / Self Care | Attending: Internal Medicine

## 2014-12-19 ENCOUNTER — Encounter (HOSPITAL_COMMUNITY)
Admission: AD | Disposition: A | Discharge status: Home / Self Care | Payer: Self-pay | Source: Other Acute Inpatient Hospital | Attending: Cardiology

## 2014-12-19 ENCOUNTER — Inpatient Hospital Stay (HOSPITAL_COMMUNITY)
Admission: AD | Admit: 2014-12-19 | Discharge: 2014-12-21 | DRG: 246 | Disposition: A | Discharge status: Home / Self Care | Payer: Commercial Managed Care - HMO | Source: Other Acute Inpatient Hospital | Attending: Cardiology | Admitting: Cardiology

## 2014-12-19 DIAGNOSIS — Z7902 Long term (current) use of antithrombotics/antiplatelets: Secondary | ICD-10-CM

## 2014-12-19 DIAGNOSIS — Z794 Long term (current) use of insulin: Secondary | ICD-10-CM | POA: Diagnosis not present

## 2014-12-19 DIAGNOSIS — R0602 Shortness of breath: Secondary | ICD-10-CM | POA: Diagnosis present

## 2014-12-19 DIAGNOSIS — K219 Gastro-esophageal reflux disease without esophagitis: Secondary | ICD-10-CM | POA: Diagnosis present

## 2014-12-19 DIAGNOSIS — I25110 Atherosclerotic heart disease of native coronary artery with unstable angina pectoris: Secondary | ICD-10-CM | POA: Diagnosis not present

## 2014-12-19 DIAGNOSIS — I2510 Atherosclerotic heart disease of native coronary artery without angina pectoris: Secondary | ICD-10-CM | POA: Diagnosis present

## 2014-12-19 DIAGNOSIS — Z87891 Personal history of nicotine dependence: Secondary | ICD-10-CM

## 2014-12-19 DIAGNOSIS — N390 Urinary tract infection, site not specified: Secondary | ICD-10-CM | POA: Diagnosis present

## 2014-12-19 DIAGNOSIS — E119 Type 2 diabetes mellitus without complications: Secondary | ICD-10-CM | POA: Diagnosis not present

## 2014-12-19 DIAGNOSIS — G4733 Obstructive sleep apnea (adult) (pediatric): Secondary | ICD-10-CM | POA: Diagnosis present

## 2014-12-19 DIAGNOSIS — I10 Essential (primary) hypertension: Secondary | ICD-10-CM | POA: Diagnosis present

## 2014-12-19 DIAGNOSIS — Z6841 Body Mass Index (BMI) 40.0 and over, adult: Secondary | ICD-10-CM

## 2014-12-19 DIAGNOSIS — M1990 Unspecified osteoarthritis, unspecified site: Secondary | ICD-10-CM | POA: Diagnosis present

## 2014-12-19 DIAGNOSIS — Z886 Allergy status to analgesic agent status: Secondary | ICD-10-CM | POA: Diagnosis not present

## 2014-12-19 DIAGNOSIS — E1140 Type 2 diabetes mellitus with diabetic neuropathy, unspecified: Secondary | ICD-10-CM | POA: Diagnosis not present

## 2014-12-19 DIAGNOSIS — Z79899 Other long term (current) drug therapy: Secondary | ICD-10-CM | POA: Diagnosis not present

## 2014-12-19 DIAGNOSIS — Z888 Allergy status to other drugs, medicaments and biological substances status: Secondary | ICD-10-CM

## 2014-12-19 DIAGNOSIS — I2109 ST elevation (STEMI) myocardial infarction involving other coronary artery of anterior wall: Secondary | ICD-10-CM | POA: Diagnosis present

## 2014-12-19 DIAGNOSIS — Z9861 Coronary angioplasty status: Secondary | ICD-10-CM | POA: Diagnosis not present

## 2014-12-19 DIAGNOSIS — Z882 Allergy status to sulfonamides status: Secondary | ICD-10-CM | POA: Diagnosis not present

## 2014-12-19 DIAGNOSIS — Z7982 Long term (current) use of aspirin: Secondary | ICD-10-CM | POA: Diagnosis not present

## 2014-12-19 DIAGNOSIS — Z8249 Family history of ischemic heart disease and other diseases of the circulatory system: Secondary | ICD-10-CM

## 2014-12-19 DIAGNOSIS — Z91048 Other nonmedicinal substance allergy status: Secondary | ICD-10-CM

## 2014-12-19 DIAGNOSIS — R55 Syncope and collapse: Secondary | ICD-10-CM | POA: Diagnosis not present

## 2014-12-19 DIAGNOSIS — Z880 Allergy status to penicillin: Secondary | ICD-10-CM | POA: Diagnosis not present

## 2014-12-19 DIAGNOSIS — Z8673 Personal history of transient ischemic attack (TIA), and cerebral infarction without residual deficits: Secondary | ICD-10-CM

## 2014-12-19 DIAGNOSIS — I214 Non-ST elevation (NSTEMI) myocardial infarction: Secondary | ICD-10-CM | POA: Diagnosis not present

## 2014-12-19 DIAGNOSIS — N400 Enlarged prostate without lower urinary tract symptoms: Secondary | ICD-10-CM | POA: Diagnosis present

## 2014-12-19 DIAGNOSIS — F329 Major depressive disorder, single episode, unspecified: Secondary | ICD-10-CM | POA: Diagnosis present

## 2014-12-19 DIAGNOSIS — E1165 Type 2 diabetes mellitus with hyperglycemia: Secondary | ICD-10-CM | POA: Diagnosis present

## 2014-12-19 DIAGNOSIS — E780 Pure hypercholesterolemia: Secondary | ICD-10-CM | POA: Diagnosis present

## 2014-12-19 DIAGNOSIS — R296 Repeated falls: Secondary | ICD-10-CM | POA: Diagnosis not present

## 2014-12-19 DIAGNOSIS — I639 Cerebral infarction, unspecified: Secondary | ICD-10-CM | POA: Diagnosis not present

## 2014-12-19 DIAGNOSIS — A419 Sepsis, unspecified organism: Secondary | ICD-10-CM | POA: Diagnosis not present

## 2014-12-19 HISTORY — PX: CARDIAC CATHETERIZATION: SHX172

## 2014-12-19 LAB — BASIC METABOLIC PANEL
Anion gap: 8 (ref 5–15)
BUN: 20 mg/dL (ref 6–20)
CO2: 25 mmol/L (ref 22–32)
CREATININE: 1.35 mg/dL — AB (ref 0.61–1.24)
Calcium: 8.6 mg/dL — ABNORMAL LOW (ref 8.9–10.3)
Chloride: 109 mmol/L (ref 101–111)
GFR, EST NON AFRICAN AMERICAN: 54 mL/min — AB (ref >60)
GLUCOSE: 192 mg/dL — AB (ref 65–99)
Potassium: 3.7 mmol/L (ref 3.5–5.1)
SODIUM: 142 mmol/L (ref 135–145)

## 2014-12-19 LAB — CBC
HCT: 34.4 % — ABNORMAL LOW (ref 40.0–52.0)
HEMOGLOBIN: 11.2 g/dL — AB (ref 13.0–18.0)
MCH: 25.7 pg — ABNORMAL LOW (ref 26.0–34.0)
MCHC: 32.6 g/dL (ref 32.0–36.0)
MCV: 78.9 fL — AB (ref 80.0–100.0)
Platelets: 133 10*3/uL — ABNORMAL LOW (ref 150–440)
RBC: 4.36 MIL/uL — AB (ref 4.40–5.90)
RDW: 16.7 % — ABNORMAL HIGH (ref 11.5–14.5)
WBC: 7.1 10*3/uL (ref 3.8–10.6)

## 2014-12-19 LAB — GLUCOSE, CAPILLARY
GLUCOSE-CAPILLARY: 188 mg/dL — AB (ref 65–99)
Glucose-Capillary: 166 mg/dL — ABNORMAL HIGH (ref 65–99)
Glucose-Capillary: 163 mg/dL — ABNORMAL HIGH (ref 65–99)

## 2014-12-19 LAB — HEPARIN LEVEL (UNFRACTIONATED)
HEPARIN UNFRACTIONATED: 0.24 [IU]/mL — AB (ref 0.30–0.70)
Heparin Unfractionated: 0.11 IU/mL — ABNORMAL LOW (ref 0.30–0.70)

## 2014-12-19 LAB — POCT ACTIVATED CLOTTING TIME: Activated Clotting Time: 417 seconds

## 2014-12-19 LAB — TROPONIN I: Troponin I: 7.38 ng/mL — ABNORMAL HIGH (ref <0.031)

## 2014-12-19 SURGERY — CORONARY STENT INTERVENTION
Anesthesia: LOCAL

## 2014-12-19 SURGERY — CORONARY STENT INTERVENTION

## 2014-12-19 SURGERY — LEFT HEART CATH AND CORONARY ANGIOGRAPHY
Anesthesia: Moderate Sedation | Laterality: Left

## 2014-12-19 MED ORDER — SODIUM CHLORIDE 0.9 % IV SOLN
INTRAVENOUS | Status: DC | PRN
  Administered 2014-12-19: 250 mL/h via INTRAVENOUS

## 2014-12-19 MED ORDER — CLOPIDOGREL BISULFATE 300 MG PO TABS
ORAL_TABLET | ORAL | Status: DC | PRN
  Administered 2014-12-19: 300 mg via ORAL

## 2014-12-19 MED ORDER — SODIUM CHLORIDE 0.9 % IJ SOLN: 3.0000 mL | Freq: Two times a day (BID) | INTRAMUSCULAR | Status: DC

## 2014-12-19 MED ORDER — FENTANYL CITRATE (PF) 100 MCG/2ML IJ SOLN
INTRAMUSCULAR | Status: AC
  Filled 2014-12-19: qty 4

## 2014-12-19 MED ORDER — SODIUM CHLORIDE 0.9 % IV SOLN: 250.0000 mL | INTRAVENOUS | Status: DC | PRN

## 2014-12-19 MED ORDER — ASPIRIN 81 MG PO CHEW: 81.0000 mg | CHEWABLE_TABLET | ORAL | Status: DC

## 2014-12-19 MED ORDER — CLOPIDOGREL BISULFATE 300 MG PO TABS
ORAL_TABLET | ORAL | Status: AC
  Filled 2014-12-19: qty 1

## 2014-12-19 MED ORDER — SODIUM CHLORIDE 0.9 % IV SOLN
1.7500 mg/kg/h | INTRAVENOUS | Status: DC
  Filled 2014-12-19: qty 250

## 2014-12-19 MED ORDER — IOHEXOL 300 MG/ML  SOLN
INTRAMUSCULAR | Status: DC | PRN
  Administered 2014-12-19: 130 mL via INTRAVENOUS

## 2014-12-19 MED ORDER — LEVOFLOXACIN 750 MG PO TABS
750.0000 mg | ORAL_TABLET | ORAL | Status: DC
  Administered 2014-12-20 – 2014-12-21 (×2): 750 mg via ORAL
  Filled 2014-12-19 (×4): qty 1

## 2014-12-19 MED ORDER — TAMSULOSIN HCL 0.4 MG PO CAPS
0.4000 mg | ORAL_CAPSULE | Freq: Every day | ORAL | Status: DC
  Administered 2014-12-20 – 2014-12-21 (×2): 0.4 mg via ORAL
  Filled 2014-12-19 (×2): qty 1

## 2014-12-19 MED ORDER — SODIUM CHLORIDE 0.9 % WEIGHT BASED INFUSION: 1.0000 mL/kg/h | INTRAVENOUS | Status: DC

## 2014-12-19 MED ORDER — PREGABALIN 100 MG PO CAPS
100.0000 mg | ORAL_CAPSULE | Freq: Two times a day (BID) | ORAL | Status: DC
  Administered 2014-12-19 – 2014-12-21 (×4): 100 mg via ORAL
  Filled 2014-12-19 (×4): qty 1

## 2014-12-19 MED ORDER — LIDOCAINE HCL (PF) 1 % IJ SOLN
INTRAMUSCULAR | Status: AC
  Filled 2014-12-19: qty 30

## 2014-12-19 MED ORDER — DULOXETINE HCL 60 MG PO CPEP
90.0000 mg | ORAL_CAPSULE | Freq: Every day | ORAL | Status: DC
  Administered 2014-12-20 – 2014-12-21 (×2): 90 mg via ORAL
  Filled 2014-12-19 (×2): qty 1

## 2014-12-19 MED ORDER — PANTOPRAZOLE SODIUM 40 MG PO TBEC
40.0000 mg | DELAYED_RELEASE_TABLET | Freq: Every day | ORAL | Status: DC
  Administered 2014-12-20 – 2014-12-21 (×2): 40 mg via ORAL
  Filled 2014-12-19 (×2): qty 1

## 2014-12-19 MED ORDER — HYDROMORPHONE HCL 1 MG/ML IJ SOLN
INTRAMUSCULAR | Status: DC | PRN
  Administered 2014-12-19: 0.5 mg via INTRAVENOUS

## 2014-12-19 MED ORDER — HEPARIN (PORCINE) IN NACL 2-0.9 UNIT/ML-% IJ SOLN
INTRAMUSCULAR | Status: AC
  Filled 2014-12-19: qty 1000

## 2014-12-19 MED ORDER — INSULIN GLARGINE 100 UNIT/ML ~~LOC~~ SOLN
20.0000 [IU] | Freq: Every day | SUBCUTANEOUS | Status: DC
Start: 2014-12-19 — End: 2014-12-21
  Administered 2014-12-19 – 2014-12-20 (×2): 20 [IU] via SUBCUTANEOUS
  Filled 2014-12-19 (×3): qty 0.2

## 2014-12-19 MED ORDER — MIDAZOLAM HCL 2 MG/2ML IJ SOLN
INTRAMUSCULAR | Status: AC
  Filled 2014-12-19: qty 2

## 2014-12-19 MED ORDER — SODIUM CHLORIDE 0.9 % IV SOLN: INTRAVENOUS | Status: DC

## 2014-12-19 MED ORDER — LIDOCAINE HCL (PF) 1 % IJ SOLN
INTRAMUSCULAR | Status: DC | PRN
  Administered 2014-12-19: 21:00:00

## 2014-12-19 MED ORDER — ONDANSETRON HCL 4 MG/2ML IJ SOLN: 4.0000 mg | Freq: Four times a day (QID) | INTRAMUSCULAR | Status: DC | PRN

## 2014-12-19 MED ORDER — SODIUM CHLORIDE 0.9 % IV SOLN
250.0000 mg | INTRAVENOUS | Status: DC | PRN
  Administered 2014-12-19 (×3): 1.75 mg/kg/h via INTRAVENOUS

## 2014-12-19 MED ORDER — ACETAMINOPHEN 325 MG PO TABS
650.0000 mg | ORAL_TABLET | ORAL | Status: DC | PRN
  Administered 2014-12-20: 650 mg via ORAL
  Filled 2014-12-19: qty 2

## 2014-12-19 MED ORDER — SODIUM CHLORIDE 0.9 % IJ SOLN
3.0000 mL | Freq: Two times a day (BID) | INTRAMUSCULAR | Status: DC
  Administered 2014-12-20: 3 mL via INTRAVENOUS

## 2014-12-19 MED ORDER — METOPROLOL TARTRATE 12.5 MG HALF TABLET
12.5000 mg | ORAL_TABLET | Freq: Two times a day (BID) | ORAL | Status: DC
Start: 2014-12-19 — End: 2014-12-21
  Administered 2014-12-19 – 2014-12-21 (×4): 12.5 mg via ORAL
  Filled 2014-12-19 (×5): qty 1

## 2014-12-19 MED ORDER — HEPARIN SODIUM (PORCINE) 1000 UNIT/ML IJ SOLN
4000.0000 [IU] | Freq: Once | INTRAMUSCULAR | Status: AC
  Administered 2014-12-19: 4000 [IU] via INTRAVENOUS
  Filled 2014-12-19: qty 4

## 2014-12-19 MED ORDER — FENTANYL CITRATE (PF) 100 MCG/2ML IJ SOLN
INTRAMUSCULAR | Status: DC | PRN
  Administered 2014-12-19 (×5): 25 ug via INTRAVENOUS

## 2014-12-19 MED ORDER — ASPIRIN 300 MG RE SUPP: 300.0000 mg | RECTAL | Status: DC

## 2014-12-19 MED ORDER — FLUTICASONE PROPIONATE 50 MCG/ACT NA SUSP
2.0000 | Freq: Every day | NASAL | Status: DC
  Administered 2014-12-20: 09:00:00 2 via NASAL
  Filled 2014-12-19: qty 16

## 2014-12-19 MED ORDER — MORPHINE SULFATE 2 MG/ML IJ SOLN
INTRAMUSCULAR | Status: AC
  Filled 2014-12-19: qty 1

## 2014-12-19 MED ORDER — ASPIRIN 81 MG PO CHEW: 324.0000 mg | CHEWABLE_TABLET | ORAL | Status: DC

## 2014-12-19 MED ORDER — LIDOCAINE HCL (PF) 1 % IJ SOLN
INTRAMUSCULAR | Status: DC | PRN
  Administered 2014-12-19: 25 mL

## 2014-12-19 MED ORDER — ACETAMINOPHEN 325 MG PO TABS: 650.0000 mg | ORAL_TABLET | ORAL | Status: DC | PRN

## 2014-12-19 MED ORDER — FENOFIBRATE 54 MG PO TABS
54.0000 mg | ORAL_TABLET | Freq: Every evening | ORAL | Status: DC
Start: 2014-12-19 — End: 2014-12-21
  Administered 2014-12-19 – 2014-12-20 (×2): 54 mg via ORAL
  Filled 2014-12-19 (×4): qty 1

## 2014-12-19 MED ORDER — LIDOCAINE HCL (PF) 1 % IJ SOLN
INTRAMUSCULAR | Status: DC | PRN
  Administered 2014-12-19: 20 mL

## 2014-12-19 MED ORDER — BUPROPION HCL ER (XL) 150 MG PO TB24
150.0000 mg | ORAL_TABLET | Freq: Two times a day (BID) | ORAL | Status: DC
  Administered 2014-12-19 – 2014-12-21 (×4): 150 mg via ORAL
  Filled 2014-12-19 (×5): qty 1

## 2014-12-19 MED ORDER — BIVALIRUDIN BOLUS VIA INFUSION - CUPID
INTRAVENOUS | Status: DC | PRN
  Administered 2014-12-19: 103.35 mg via INTRAVENOUS

## 2014-12-19 MED ORDER — MIDAZOLAM HCL 2 MG/2ML IJ SOLN
INTRAMUSCULAR | Status: AC
  Filled 2014-12-19: qty 4

## 2014-12-19 MED ORDER — ASPIRIN EC 81 MG PO TBEC: 81.0000 mg | DELAYED_RELEASE_TABLET | Freq: Every day | ORAL | Status: DC

## 2014-12-19 MED ORDER — ATORVASTATIN CALCIUM 80 MG PO TABS
80.0000 mg | ORAL_TABLET | Freq: Every day | ORAL | Status: DC
  Administered 2014-12-19 – 2014-12-20 (×2): 80 mg via ORAL
  Filled 2014-12-19 (×3): qty 1

## 2014-12-19 MED ORDER — SODIUM CHLORIDE 0.9 % IJ SOLN: 3.0000 mL | INTRAMUSCULAR | Status: DC | PRN

## 2014-12-19 MED ORDER — FENTANYL CITRATE (PF) 100 MCG/2ML IJ SOLN
INTRAMUSCULAR | Status: DC | PRN
  Administered 2014-12-19 (×2): 50 ug via INTRAVENOUS

## 2014-12-19 MED ORDER — CLOPIDOGREL BISULFATE 75 MG PO TABS
300.0000 mg | ORAL_TABLET | Freq: Once | ORAL | Status: AC
  Administered 2014-12-19: 300 mg via ORAL

## 2014-12-19 MED ORDER — CLOPIDOGREL BISULFATE 75 MG PO TABS
150.0000 mg | ORAL_TABLET | Freq: Every day | ORAL | Status: DC
  Administered 2014-12-20 – 2014-12-21 (×2): 150 mg via ORAL
  Filled 2014-12-19: qty 2

## 2014-12-19 MED ORDER — IOHEXOL 350 MG/ML SOLN
INTRAVENOUS | Status: DC | PRN
  Administered 2014-12-19: 300 mL via INTRA_ARTERIAL

## 2014-12-19 MED ORDER — ASPIRIN EC 81 MG PO TBEC
81.0000 mg | DELAYED_RELEASE_TABLET | Freq: Every day | ORAL | Status: DC
  Administered 2014-12-20 – 2014-12-21 (×2): 81 mg via ORAL
  Filled 2014-12-19 (×2): qty 1

## 2014-12-19 MED ORDER — NITROGLYCERIN IN D5W 200-5 MCG/ML-% IV SOLN: 5.0000 ug/min | INTRAVENOUS | Status: DC

## 2014-12-19 MED ORDER — MORPHINE SULFATE 2 MG/ML IJ SOLN
2.0000 mg | INTRAMUSCULAR | Status: AC
  Administered 2014-12-19: 2 mg via INTRAVENOUS

## 2014-12-19 MED ORDER — HEPARIN BOLUS VIA INFUSION
1500.0000 [IU] | Freq: Once | INTRAVENOUS | Status: AC
  Administered 2014-12-19: 1500 [IU] via INTRAVENOUS
  Filled 2014-12-19: qty 1500

## 2014-12-19 MED ORDER — HYDROMORPHONE HCL 1 MG/ML IJ SOLN
INTRAMUSCULAR | Status: AC
  Filled 2014-12-19: qty 1

## 2014-12-19 MED ORDER — SODIUM CHLORIDE 0.9 % IJ SOLN
3.0000 mL | INTRAMUSCULAR | Status: DC | PRN
Start: 2014-12-19 — End: 2014-12-19

## 2014-12-19 MED ORDER — SODIUM CHLORIDE 0.9 % IV SOLN
0.2500 mg/kg/h | INTRAVENOUS | Status: DC
  Filled 2014-12-19: qty 250

## 2014-12-19 MED ORDER — MORPHINE SULFATE 2 MG/ML IJ SOLN
2.0000 mg | INTRAMUSCULAR | Status: DC | PRN
  Administered 2014-12-20: 2 mg via INTRAVENOUS
  Filled 2014-12-19: qty 1

## 2014-12-19 MED ORDER — SODIUM CHLORIDE 0.9 % IJ SOLN
3.0000 mL | Freq: Two times a day (BID) | INTRAMUSCULAR | Status: DC
Start: 2014-12-19 — End: 2014-12-19

## 2014-12-19 MED ORDER — FENTANYL CITRATE (PF) 100 MCG/2ML IJ SOLN
INTRAMUSCULAR | Status: AC
  Filled 2014-12-19: qty 2

## 2014-12-19 MED ORDER — NITROGLYCERIN 1 MG/10 ML FOR IR/CATH LAB
INTRA_ARTERIAL | Status: AC
  Filled 2014-12-19: qty 10

## 2014-12-19 MED ORDER — MIDAZOLAM HCL 2 MG/2ML IJ SOLN
INTRAMUSCULAR | Status: DC | PRN
  Administered 2014-12-19 (×3): 1 mg via INTRAVENOUS

## 2014-12-19 MED ORDER — SODIUM CHLORIDE 0.9 % WEIGHT BASED INFUSION: 3.0000 mL/kg/h | INTRAVENOUS | Status: DC

## 2014-12-19 MED ORDER — NITROGLYCERIN 0.4 MG SL SUBL: 0.4000 mg | SUBLINGUAL_TABLET | SUBLINGUAL | Status: DC | PRN

## 2014-12-19 MED ORDER — INSULIN ASPART 100 UNIT/ML ~~LOC~~ SOLN
0.0000 [IU] | Freq: Three times a day (TID) | SUBCUTANEOUS | Status: DC
  Administered 2014-12-20: 06:00:00 3 [IU] via SUBCUTANEOUS
  Administered 2014-12-20 (×2): 5 [IU] via SUBCUTANEOUS
  Administered 2014-12-21: 3 [IU] via SUBCUTANEOUS

## 2014-12-19 MED ORDER — HEPARIN SODIUM (PORCINE) 1000 UNIT/ML IJ SOLN
INTRAMUSCULAR | Status: AC
  Filled 2014-12-19: qty 1

## 2014-12-19 MED ORDER — HEPARIN BOLUS VIA INFUSION
3000.0000 [IU] | Freq: Once | INTRAVENOUS | Status: AC
Start: 2014-12-19 — End: 2014-12-19
  Administered 2014-12-19: 3000 [IU] via INTRAVENOUS
  Filled 2014-12-19: qty 3000

## 2014-12-19 MED ORDER — BIVALIRUDIN BOLUS VIA INFUSION
0.1000 mg/kg | Freq: Once | INTRAVENOUS | Status: DC
  Filled 2014-12-19: qty 14

## 2014-12-19 MED ORDER — SODIUM CHLORIDE 0.9 % IV SOLN
0.2500 mg/kg/h | INTRAVENOUS | Status: DC
  Filled 2014-12-19 (×2): qty 250

## 2014-12-19 MED ORDER — SODIUM CHLORIDE 0.9 % IV SOLN
INTRAVENOUS | Status: DC
  Administered 2014-12-19: 12:00:00 via INTRAVENOUS

## 2014-12-19 MED ORDER — NITROGLYCERIN 1 MG/10 ML FOR IR/CATH LAB
INTRA_ARTERIAL | Status: DC | PRN
  Administered 2014-12-19 (×2): 100 ug

## 2014-12-19 MED ORDER — CLOPIDOGREL BISULFATE 75 MG PO TABS
150.0000 mg | ORAL_TABLET | Freq: Every day | ORAL | Status: DC
  Filled 2014-12-19: qty 2

## 2014-12-19 MED ORDER — MIDAZOLAM HCL 2 MG/2ML IJ SOLN
INTRAMUSCULAR | Status: DC | PRN
  Administered 2014-12-19: 1 mg via INTRAVENOUS

## 2014-12-19 SURGICAL SUPPLY — 29 items
BALLN EMERGE MR 2.5X12 (BALLOONS) ×3
BALLN EMERGE MR 2.5X15 (BALLOONS) ×3
BALLN ~~LOC~~ EMERGE MR 3.0X20 (BALLOONS) ×3
BALLN ~~LOC~~ EMERGE MR 3.75X30 (BALLOONS) ×3
BALLN ~~LOC~~ TREK RX 2.75X20 (BALLOONS) ×3 IMPLANT
BALLN ~~LOC~~ TREK RX 3.5X25 (BALLOONS) ×3 IMPLANT
BALLOON EMERGE MR 2.5X12 (BALLOONS) ×1 IMPLANT
BALLOON EMERGE MR 2.5X15 (BALLOONS) ×1 IMPLANT
BALLOON ~~LOC~~ EMERGE MR 3.0X20 (BALLOONS) ×1 IMPLANT
BALLOON ~~LOC~~ EMERGE MR 3.75X30 (BALLOONS) ×1 IMPLANT
ELECT DEFIB PAD ADLT CADENCE (PAD) ×3 IMPLANT
GUIDE CATH RUNWAY 6FR FR4 (CATHETERS) ×3 IMPLANT
GUIDE CATH RUNWAY 6FR VL3.5 (CATHETERS) ×3 IMPLANT
HOVERMATT SINGLE USE (MISCELLANEOUS) ×3 IMPLANT
KIT ENCORE 26 ADVANTAGE (KITS) ×3 IMPLANT
KIT HEART LEFT (KITS) ×3 IMPLANT
PACK CARDIAC CATHETERIZATION (CUSTOM PROCEDURE TRAY) ×3 IMPLANT
SHEATH PINNACLE 6F 10CM (SHEATH) ×3 IMPLANT
STENT XIENCE ALPINE RX 2.5X28 (Permanent Stent) ×3 IMPLANT
STENT XIENCE ALPINE RX 2.5X38 (Permanent Stent) ×3 IMPLANT
STENT XIENCE ALPINE RX 2.75X33 (Permanent Stent) ×3 IMPLANT
STENT XIENCE ALPINE RX 3.0X38 (Permanent Stent) ×3 IMPLANT
STENT XIENCE ALPINE RX 3.5X12 (Permanent Stent) ×3 IMPLANT
STENT XIENCE ALPINE RX 3.5X28 (Permanent Stent) ×3 IMPLANT
STENT XIENCE ALPINE RX 3.5X38 (Permanent Stent) ×3 IMPLANT
TRANSDUCER W/STOPCOCK (MISCELLANEOUS) ×3 IMPLANT
TUBING CIL FLEX 10 FLL-RA (TUBING) ×3 IMPLANT
WIRE EMERALD 3MM-J .035X150CM (WIRE) ×6 IMPLANT
WIRE RUNTHROUGH .014X180CM (WIRE) ×3 IMPLANT

## 2014-12-19 SURGICAL SUPPLY — 8 items
CATH INFINITI 5FR ANG PIGTAIL (CATHETERS) ×3 IMPLANT
CATH INFINITI 5FR JL4 (CATHETERS) ×3 IMPLANT
CATH INFINITI JR4 5F (CATHETERS) ×3 IMPLANT
KIT MANI 3VAL PERCEP (MISCELLANEOUS) ×3 IMPLANT
NEEDLE PERC 18GX7CM (NEEDLE) ×3 IMPLANT
PACK CARDIAC CATH (CUSTOM PROCEDURE TRAY) ×3 IMPLANT
SHEATH PINNACLE 5F 10CM (SHEATH) ×3 IMPLANT
WIRE EMERALD 3MM-J .035X150CM (WIRE) ×3 IMPLANT

## 2014-12-19 NOTE — Progress Notes (Signed)
Per MD Humphrey Rolls, pt will transfer directly to [facility] from Cath Lab for high risk PCI, hospitalist Gouru notified

## 2014-12-19 NOTE — Progress Notes (Signed)
Inpatient Diabetes Program Recommendations  AACE/ADA: New Consensus Statement on Inpatient Glycemic Control (2013)  Target Ranges:  Prepandial:   less than 140 mg/dL      Peak postprandial:   less than 180 mg/dL (1-2 hours)      Critically ill patients:  140 - 180 mg/dL   Reason for assessment: elevated CBG  Diabetes history: Type 2 Outpatient Diabetes medications: Metaglip (Metformin plus Glipizide 5/500mg ), Novolog 2-10 units units tid with meals, Lantus 41 units qday, Victoza 0.6mg /day Current orders for Inpatient glycemic control: Novolog 0-20 units tid with meals, Novolog 0-5 units qhs, Lantus 41 units per day, Liraglutide 0.6mg /day  Please consider adding mealtime insulin 4 units tid with meals.  Continue Novolog correction insulin as ordered.   Gentry Fitz, RN, BA, MHA, CDE Diabetes Coordinator Inpatient Diabetes Program  [phone] (Team Pager) [phone] ([place]) 12/19/2014 9:38 AM

## 2014-12-19 NOTE — Progress Notes (Signed)
Pt refused to wear cpap last night. He wears nasal pillows at home but he di not bring them with him and we only have mask that he does not like. Pt wanted to stay on nasal cannula. Nurse was notified.

## 2014-12-19 NOTE — Interval H&P Note (Signed)
Cath Lab Visit (complete for each Cath Lab visit)  Clinical Evaluation Leading to the Procedure:   ACS: Yes.    Non-ACS:    Anginal Classification: CCS IV  Anti-ischemic medical therapy: Maximal Therapy (2 or more classes of medications)  Non-Invasive Test Results: No non-invasive testing performed  Prior CABG: No previous CABG      History and Physical Interval Note:  12/19/2014 6:22 PM  Victor Wise  has presented today for surgery, with the diagnosis of cad  The various methods of treatment have been discussed with the patient and family. After consideration of risks, benefits and other options for treatment, the patient has consented to  Procedure(s): Coronary Stent Intervention (N/A) as a surgical intervention .  The patient's history has been reviewed, patient examined, no change in status, stable for surgery.  I have reviewed the patient's chart and labs.  Questions were answered to the patient's satisfaction.     Charolette Forward

## 2014-12-19 NOTE — Progress Notes (Addendum)
ANTICOAGULATION CONSULT NOTE - Follow Up  Pharmacy Consult for Unfractionated Heparin Indication: R/o ACS  Allergies  Allergen Reactions  . Amoxicillin Shortness Of Breath and Itching  . Aspirin Itching and Other (See Comments)    Only in large doses  . Crestor [Rosuvastatin Calcium] Other (See Comments)    Pt states that it caused his arms and legs to "lock up".  . Hydralazine Other (See Comments)    Reaction:  Cramping   . Penicillins Itching  . Sulfa Antibiotics Itching  . Yellow Dyes (Non-Tartrazine) Itching    Patient Measurements: Height: 5\' 9"  (175.3 cm) Weight: 300 lb (136.079 kg) IBW/kg (Calculated) : 70.7 Heparin Dosing Weight:   Vital Signs: Temp: 100.3 F (37.9 C) (08/04 1928) Temp Source: Oral (08/04 1928) BP: 136/68 mmHg (08/04 1928) Pulse Rate: 92 (08/04 1928)  Labs:  Recent Labs  12/18/14 1511 12/19/14 0017  HGB 12.1* 11.2*  HCT 36.9* 34.4*  PLT 156 133*  APTT 32  --   LABPROT 14.7  --   INR 1.13  --   HEPARINUNFRC  --  0.11*  CREATININE 1.18 1.35*  TROPONINI 2.25*  0.17*  --     Estimated Creatinine Clearance: 76.8 mL/min (by C-G formula based on Cr of 1.35).   Medical History: Past Medical History  Diagnosis Date  . Diabetes   . Hypertension   . BPH (benign prostatic hyperplasia)   . Osteomyelitis of foot   . Morbid obesity   . Obstructive sleep apnea   . Chronic back pain   . Depression   . UTI (lower urinary tract infection)     Medications:  Prescriptions prior to admission  Medication Sig Dispense Refill Last Dose  . amLODipine (NORVASC) 10 MG tablet Take 10 mg by mouth daily.   unknown at unknown  . aspirin 81 MG chewable tablet Chew 81 mg by mouth daily.   unknown at unknown  . aspirin-acetaminophen-caffeine (EXCEDRIN MIGRAINE) 250-250-65 MG per tablet Take 2 tablets by mouth every 6 (six) hours as needed for headache.   PRN at PRN  . benazepril (LOTENSIN) 40 MG tablet Take 40 mg by mouth daily.   unknown at unknown  .  buPROPion (WELLBUTRIN XL) 150 MG 24 hr tablet Take 150 mg by mouth 2 (two) times daily.   unknown at unknown  . clopidogrel (PLAVIX) 75 MG tablet Take 75 mg by mouth daily.   unknown at unknown  . DULoxetine (CYMBALTA) 30 MG capsule Take 90 mg by mouth daily.   unknown at unknown  . fenofibrate 54 MG tablet Take 54 mg by mouth every evening.   unknown at unknown  . fluticasone (FLONASE) 50 MCG/ACT nasal spray Place 2 sprays into both nostrils daily.   unknown at unknown  . glipiZIDE-metformin (METAGLIP) 5-500 MG per tablet Take 2 tablets by mouth 2 (two) times daily.   unknown at unknown  . hydrochlorothiazide (MICROZIDE) 12.5 MG capsule Take 12.5 mg by mouth daily.   unknown at unknown  . insulin aspart (NOVOLOG) 100 UNIT/ML injection Inject 2-10 Units into the skin 3 (three) times daily with meals as needed for high blood sugar. Pt uses as needed per sliding scale.   unknown at unknown  . insulin glargine (LANTUS) 100 UNIT/ML injection Inject 41 Units into the skin at bedtime.   unknown at unknown  . Liraglutide (VICTOZA) 18 MG/3ML SOPN Inject 0.6 mg into the skin every evening.    unknown at unknown  . montelukast (SINGULAIR) 10 MG tablet Take  10 mg by mouth daily.   unknown at unknown  . Oxcarbazepine (TRILEPTAL) 300 MG tablet Take 300 mg by mouth 3 (three) times daily.   unknown at unknown  . pantoprazole (PROTONIX) 40 MG tablet Take 40 mg by mouth daily.   unknown at unknown  . pregabalin (LYRICA) 200 MG capsule Take 200 mg by mouth 3 (three) times daily.   unknown at unknown  . tamsulosin (FLOMAX) 0.4 MG CAPS capsule Take 0.4 mg by mouth daily.   unknown at unknown    Assessment: CrCl = 87.8 ml/min Wt = 136.1 kg BMI = 44.4   Goal of Therapy:  R/o ACS   Plan:  Heparin level was low at 0.11. Spoke with RN, it has been running continuously since ordered. I placed order for 30 units/kg bolus and increased rate to 16 mL/hr. Follow up heparin level scheduled for 0700.   Charlann Lange  Rhoda Waldvogel 12/19/2014,12:54 AM

## 2014-12-19 NOTE — Discharge Summary (Signed)
[facility] [place] NAME: Victor Wise    MR#:  [id number]  DATE OF BIRTH:  06/17/1950  DATE OF ADMISSION:  12/18/2014 ADMITTING PHYSICIAN: Henreitta Leber, MD  DATE OF anticipated transfer - 12/19/14 PRIMARY CARE PHYSICIAN: Lavera Guise, MD    ADMISSION DIAGNOSIS:  NSTEMI (non-ST elevated myocardial infarction) [I21.4] Sepsis, due to unspecified organism [A41.9]  DISCHARGE DIAGNOSIS:  Active Problems:   SIRS (systemic inflammatory response syndrome)  non-STEMI with abnormal cardiac catheter  SECONDARY DIAGNOSIS:   Past Medical History  Diagnosis Date  . Diabetes   . Hypertension   . BPH (benign prostatic hyperplasia)   . Osteomyelitis of foot   . Morbid obesity   . Obstructive sleep apnea   . Chronic back pain   . Depression   . UTI (lower urinary tract infection)     HOSPITAL COURSE:   64 year old male with past medical history of hypertension, diabetes, obstructive sleep apnea, morbid obesity, history of osteomyelitis, history of recurrent UTIs, who presented to the hospital due to a fall and also noted to have an elevated troponin and febrile with a temperature 103.  #1 sepsis-patient presented with fever 102, tachycardia with heart rates in the 120s and also noted to have abnormal urinalysis. -Continue IV fluids, patient got Vanco and Zosyn in the ER but will continue levofloxacin -Follow blood, urine cultures and follow hemodynamics.  #2 non-STEMI with elevated troponin-  Patient was seen by Dr. Humphrey Rolls and he recommends continuing heparin nomogram and follow serial cardiac markers. -. Continue aspirin, Plavix, fenofibrate -Abnormal cardiac cath today and patient is getting transferred to [place] for PTCA  #3 frequent falls/weakness-this is probably related to generalized deconditioning and morbid obesity. needs  physical therapy consult to assess his mobility.  #4 type 2 diabetes without complication-continue  Lantus, NovoLog with meals and sliding scale insulin coverage.   #5 hypertension-continue hydrochlorothiazide, benazepril, Norvasc.  #6 GERD-continue Protonix.  #7 obstructive sleep apnea-continue CPAP at bedtime.  #8 depression-continue Wellbutrin.   DISCHARGE CONDITIONS:   Guarded  CONSULTS OBTAINED:  Treatment Team:  Dionisio Shamell, MD   PROCEDURES cardiac catheterization on 12/19/2014  DRUG ALLERGIES:   Allergies  Allergen Reactions  . Amoxicillin Shortness Of Breath and Itching  . Aspirin Itching and Other (See Comments)    Only in large doses  . Crestor [Rosuvastatin Calcium] Other (See Comments)    Pt states that it caused his arms and legs to "lock up".  . Hydralazine Other (See Comments)    Reaction:  Cramping   . Penicillins Itching  . Sulfa Antibiotics Itching  . Yellow Dyes (Non-Tartrazine) Itching    DISCHARGE MEDICATIONS:   Current Discharge Medication List    CONTINUE these medications which have NOT CHANGED   Details  amLODipine (NORVASC) 10 MG tablet Take 10 mg by mouth daily.    aspirin 81 MG chewable tablet Chew 81 mg by mouth daily.    aspirin-acetaminophen-caffeine (EXCEDRIN MIGRAINE) 250-250-65 MG per tablet Take 2 tablets by mouth every 6 (six) hours as needed for headache.    benazepril (LOTENSIN) 40 MG tablet Take 40 mg by mouth daily.    buPROPion (WELLBUTRIN XL) 150 MG 24 hr tablet Take 150 mg by mouth 2 (two) times daily.    clopidogrel (PLAVIX) 75 MG tablet Take 75 mg by mouth daily.    DULoxetine (CYMBALTA) 30 MG capsule Take 90 mg by mouth daily.    fenofibrate 54 MG tablet  Take 54 mg by mouth every evening.    fluticasone (FLONASE) 50 MCG/ACT nasal spray Place 2 sprays into both nostrils daily.    glipiZIDE-metformin (METAGLIP) 5-500 MG per tablet Take 2 tablets by mouth 2 (two) times daily.    hydrochlorothiazide (MICROZIDE) 12.5 MG capsule Take 12.5 mg by mouth daily.    insulin aspart (NOVOLOG) 100 UNIT/ML injection  Inject 2-10 Units into the skin 3 (three) times daily with meals as needed for high blood sugar. Pt uses as needed per sliding scale.    insulin glargine (LANTUS) 100 UNIT/ML injection Inject 41 Units into the skin at bedtime.    Liraglutide (VICTOZA) 18 MG/3ML SOPN Inject 0.6 mg into the skin every evening.     montelukast (SINGULAIR) 10 MG tablet Take 10 mg by mouth daily.    Oxcarbazepine (TRILEPTAL) 300 MG tablet Take 300 mg by mouth 3 (three) times daily.    pantoprazole (PROTONIX) 40 MG tablet Take 40 mg by mouth daily.    pregabalin (LYRICA) 200 MG capsule Take 200 mg by mouth 3 (three) times daily.    tamsulosin (FLOMAX) 0.4 MG CAPS capsule Take 0.4 mg by mouth daily.         DISCHARGE INSTRUCTIONS:   Getting transferred to [facility] for PTCA  DIET:  Nothing by mouth  DISCHARGE CONDITION:  Critical  ACTIVITY:  Bedrest  OXYGEN:  Home Oxygen: Yes.     Oxygen Delivery: 2 liters/min via   DISCHARGE LOCATION:  [facility]  If you experience worsening of your admission symptoms, develop shortness of breath, life threatening emergency, suicidal or homicidal thoughts you must seek medical attention immediately by calling 911 or calling your MD immediately  if symptoms less severe.  You Must read complete instructions/literature along with all the possible adverse reactions/side effects for all the Medicines you take and that have been prescribed to you. Take any new Medicines after you have completely understood and accpet all the possible adverse reactions/side effects.   Please note  You were cared for by a hospitalist during your hospital stay. If you have any questions about your discharge medications or the care you received while you were in the hospital after you are discharged, you can call the unit and asked to speak with the hospitalist on call if the hospitalist that took care of you is not available. Once you are discharged, your primary care  physician will handle any further medical issues. Please note that NO REFILLS for any discharge medications will be authorized once you are discharged, as it is imperative that you return to your primary care physician (or establish a relationship with a primary care physician if you do not have one) for your aftercare needs so that they can reassess your need for medications and monitor your lab values.     Today  Chief Complaint  Patient presents with  . Code Sepsis   Patient was seen and examined following cardiac catheterization. Resting comfortably. Denies any chest pain or shortness of breath. Reporting dry mouth. Sister and brother-in-law are at bedside  ROS:  CONSTITUTIONAL: Denies fevers, chills. Denies any fatigue, weakness. Reporting dry mouth EYES: Denies blurry vision, double vision, eye pain. EARS, NOSE, THROAT: Denies tinnitus, ear pain, hearing loss. RESPIRATORY: Denies cough, wheeze, shortness of breath.  CARDIOVASCULAR: Denies chest pain, palpitations, edema.  GASTROINTESTINAL: Denies nausea, vomiting, diarrhea, abdominal pain. Denies bright red blood per rectum. GENITOURINARY: Denies dysuria, hematuria. ENDOCRINE: Denies nocturia or thyroid problems. HEMATOLOGIC AND LYMPHATIC: Denies easy  bruising or bleeding. SKIN: Denies rash or lesion. MUSCULOSKELETAL: Denies pain in neck, back, shoulder, knees, hips or arthritic symptoms.  NEUROLOGIC: Denies paralysis, paresthesias.  PSYCHIATRIC: Denies anxiety or depressive symptoms.   VITAL SIGNS:  Blood pressure 112/68, pulse 67, temperature 98 F (36.7 C), temperature source Oral, resp. rate 22, height 5\' 9"  (1.753 m), weight 137.848 kg (303 lb 14.4 oz), SpO2 95 %.  I/O:   Intake/Output Summary (Last 24 hours) at 12/19/14 1335 Last data filed at 12/19/14 1208  Gross per 24 hour  Intake   1250 ml  Output   1000 ml  Net    250 ml    PHYSICAL EXAMINATION:  GENERAL:  63 y.o.-year-old patient lying in the bed with no  acute distress.  EYES: Pupils equal, round, reactive to light and accommodation. No scleral icterus. Extraocular muscles intact.  HEENT: Head atraumatic, normocephalic. Oropharynx and nasopharynx clear.  NECK:  Supple, no jugular venous distention. No thyroid enlargement, no tenderness.  LUNGS: Normal breath sounds bilaterally, no wheezing, rales,rhonchi or crepitation. No use of accessory muscles of respiration.  CARDIOVASCULAR: S1, S2 normal. No murmurs, rubs, or gallops.  ABDOMEN: Soft, non-tender, non-distended. Bowel sounds present. No organomegaly or mass.  EXTREMITIES: No pedal edema, cyanosis, or clubbing.  NEUROLOGIC: Cranial nerves II through XII are intact. Muscle strength 5/5 in all extremities. Sensation intact. Gait not checked.  PSYCHIATRIC: The patient is alert and oriented x 3.  SKIN: No obvious rash, lesion, or ulcer.   DATA REVIEW:   CBC  Recent Labs Lab 12/19/14 0017  WBC 7.1  HGB 11.2*  HCT 34.4*  PLT 133*    Chemistries   Recent Labs Lab 12/18/14 1511 12/19/14 0017  NA 139 142  K 4.1 3.7  CL 103 109  CO2 21* 25  GLUCOSE 248* 192*  BUN 20 20  CREATININE 1.18 1.35*  CALCIUM 9.3 8.6*  AST 40  --   ALT 22  --   ALKPHOS 73  --   BILITOT 0.8  --     Cardiac Enzymes  Recent Labs Lab 12/19/14 0017  TROPONINI 7.38*    Microbiology Results  Results for orders placed or performed during the hospital encounter of 12/18/14  Blood Culture (routine x 2)     Status: None (Preliminary result)   Collection Time: 12/18/14  3:05 PM  Result Value Ref Range Status   Specimen Description BLOOD RIGHT HAND  Final   Special Requests BOTTLES DRAWN AEROBIC AND ANAEROBIC 3CC  Final   Culture   Final    GRAM NEGATIVE RODS AEROBIC BOTTLE ONLY CRITICAL RESULT CALLED TO, READ BACK BY AND VERIFIED WITH: CHERYL GREEN,RN 12/19/2014 AT [place] BY JRS.    Report Status PENDING  Incomplete  Blood Culture (routine x 2)     Status: None (Preliminary result)   Collection  Time: 12/18/14  3:11 PM  Result Value Ref Range Status   Specimen Description BLOOD RIGHT HAND  Final   Special Requests   Final    BOTTLES DRAWN AEROBIC AND ANAEROBIC 4CCAEROBIC,4CCANA   Culture NO GROWTH < 24 HOURS  Final   Report Status PENDING  Incomplete  Urine culture     Status: None (Preliminary result)   Collection Time: 12/18/14  4:34 PM  Result Value Ref Range Status   Specimen Description URINE, CLEAN CATCH  Final   Special Requests NONE  Final   Culture   Final    >=100,000 COLONIES/mL GRAM NEGATIVE RODS IDENTIFICATION AND SUSCEPTIBILITIES TO FOLLOW  Report Status PENDING  Incomplete    RADIOLOGY:  Dg Pelvis 1-2 Views  12/18/2014   CLINICAL DATA:  Fall  EXAM: PELVIS - 1-2 VIEW  COMPARISON:  None.  FINDINGS: Two frontal view of the pelvis submitted. She no displaced fracture or subluxation. No radiopaque foreign body.  IMPRESSION: Negative.   Electronically Signed   By: Lahoma Crocker M.D.   On: 12/18/2014 16:26   Ct Head Wo Contrast  12/18/2014   CLINICAL DATA:  Found down, altered mental status  EXAM: CT HEAD WITHOUT CONTRAST  CT CERVICAL SPINE WITHOUT CONTRAST  TECHNIQUE: Multidetector CT imaging of the head and cervical spine was performed following the standard protocol without intravenous contrast. Multiplanar CT image reconstructions of the cervical spine were also generated.  COMPARISON:  Head CT 11/25/2013  FINDINGS: CT HEAD FINDINGS  No acute hemorrhage, infarct, or mass lesion is identified. No midline shift. Ventricles are normal in size. Orbits and paranasal sinuses are unremarkable. No skull fracture.  CT CERVICAL SPINE FINDINGS  Extensive streak artifact due to patient body habitus. Vertebral body heights are grossly maintained. Mild bilateral C5-C6 neural foraminal narrowing by disc degenerative change. No fracture or dislocation.  IMPRESSION: No acute intracranial abnormality.  No cervical spine fracture or dislocation.   Electronically Signed   By: Conchita Paris  M.D.   On: 12/18/2014 16:34   Ct Cervical Spine Wo Contrast  12/18/2014   CLINICAL DATA:  Found down, altered mental status  EXAM: CT HEAD WITHOUT CONTRAST  CT CERVICAL SPINE WITHOUT CONTRAST  TECHNIQUE: Multidetector CT imaging of the head and cervical spine was performed following the standard protocol without intravenous contrast. Multiplanar CT image reconstructions of the cervical spine were also generated.  COMPARISON:  Head CT 11/25/2013  FINDINGS: CT HEAD FINDINGS  No acute hemorrhage, infarct, or mass lesion is identified. No midline shift. Ventricles are normal in size. Orbits and paranasal sinuses are unremarkable. No skull fracture.  CT CERVICAL SPINE FINDINGS  Extensive streak artifact due to patient body habitus. Vertebral body heights are grossly maintained. Mild bilateral C5-C6 neural foraminal narrowing by disc degenerative change. No fracture or dislocation.  IMPRESSION: No acute intracranial abnormality.  No cervical spine fracture or dislocation.   Electronically Signed   By: Conchita Paris M.D.   On: 12/18/2014 16:34   Dg Chest Port 1 View  12/18/2014   CLINICAL DATA:  Found on the floor by sister. Patient's smells of urine. Skin is hot and diaphoretic. Increased respiratory rate.  EXAM: PORTABLE CHEST - 1 VIEW  COMPARISON:  02/06/2014  FINDINGS: The heart is enlarged. There is mild pulmonary vascular congestion but no overt edema. There are no focal consolidations or pleural effusions.  IMPRESSION: Cardiomegaly.   Electronically Signed   By: Nolon Nations M.D.   On: 12/18/2014 16:08    EKG:   Orders placed or performed during the hospital encounter of 12/18/14  . ED EKG 12-Lead  . ED EKG 12-Lead  . EKG 12-Lead  . EKG 12-Lead  . EKG 12-Lead  . EKG 12-Lead  . EKG 12-Lead  . EKG 12-Lead      Management plans discussed with the patient, family and they are in agreement.  CODE STATUS:     Code Status Orders        Start     Ordered   12/19/14 1238  Full code    Continuous     12/19/14 1238    Advance Directive Documentation  Most Recent Value   Type of Advance Directive  Living will   Pre-existing out of facility DNR order (yellow form or pink MOST form)     "MOST" Form in Place?        TOTAL TIME TAKING CARE OF THIS PATIENT: 40  minutes.    @[facility] @  on 12/19/2014 at 1:35 PM  Between 7am to 6pm - Pager - [phone]  After 6pm go to [email or web address] - password [email or web address]  Office  [phone]  CC: Primary care physician; Lavera Guise, MD

## 2014-12-19 NOTE — Progress Notes (Addendum)
Received pt from [facility] via [place].  Alert and oriented X4.  IVF infusing.  Right groin sheath intact and a level 0. Dr Terrence Dupont in to see pt to explain procedure and the plan of care.  Consent form signed and EKG completed.

## 2014-12-19 NOTE — OR Nursing (Signed)
Transferred to [facility] via [place]

## 2014-12-19 NOTE — Progress Notes (Signed)
Explained to patient that the hospital's pharmacy does not carry Liraglutide SOPN, requested if patient could bring his own insulin from home. Pt will ask a family member to bring insulin pen and needles  to hospital.

## 2014-12-19 NOTE — Progress Notes (Signed)
Pt's skin is warm to touch and diaphoretic. 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  Verified by Corky Sing, RN.

## 2014-12-19 NOTE — OR Nursing (Signed)
CBG prior to going into lab at1200: 173

## 2014-12-19 NOTE — H&P (Signed)
Victor Wise is an 64 y.o. male.   Chief Complaint: Shortness of breath and generalized weakness associated with diaphoresis fever and chills  HPI: Patient is 64 year old male with past medical history significant for multiple medical problems i.e. hypertension, type 2 diabetes mellitus, hypercholesteremia, GERD, obstructive sleep apnea, depression, degenerative joint disease, morbid obesity, history of CVA in the past, history of recurrent UTI, positive family history of coronary artery disease, was admitted at [facility] following a fall from his bed associated with generalized weakness and inability to get up subsequently developed shortness of breath diaphoresis and was noted to have high-grade fever which was attributed to UTI patient does give history of dysuria and frequency of urination patient was noted to have minimally elevated troponin I troponin I went above 7 EKG showed initially sinus tachycardia with Q waves in anterolateral leads with minimal ST elevation and also history elevation in inferior leads with minimal resiprocal Changes. Patient denied any chest pain nausea vomiting. States had multiple stress test in the past which were negative. Patient subsequently underwent cardiac catheterization today at [facility] which showed critical sequential stenosis in proximal and mid LAD and also multiple sequential stenosis in proximal and distal RCA. Patient absolutely refused for evaluation for CABG and is transferred to [facility] for high risk PCI.  Past Medical History  Diagnosis Date  . Diabetes   . Hypertension   . BPH (benign prostatic hyperplasia)   . Osteomyelitis of foot   . Morbid obesity   . Obstructive sleep apnea   . Chronic back pain   . Depression   . UTI (lower urinary tract infection)     Past Surgical History  Procedure Laterality Date  . Pain stimulator    . Right toe amputation      Family History  Problem Relation Age  of Onset  . Breast cancer Mother   . Lung cancer Father    Social History:  reports that he has quit smoking. His smoking use included Cigarettes. He does not have any smokeless tobacco history on file. He reports that he does not drink alcohol or use illicit drugs.  Allergies:  Allergies  Allergen Reactions  . Amoxicillin Shortness Of Breath and Itching  . Aspirin Itching and Other (See Comments)    Only in large doses  . Crestor [Rosuvastatin Calcium] Other (See Comments)    Pt states that it caused his arms and legs to "lock up".  . Hydralazine Other (See Comments)    Reaction:  Cramping   . Penicillins Itching  . Sulfa Antibiotics Itching  . Yellow Dyes (Non-Tartrazine) Itching    Medications Prior to Admission  Medication Sig Dispense Refill  . amLODipine (NORVASC) 10 MG tablet Take 10 mg by mouth daily.    [name] aspirin 81 MG chewable tablet Chew 81 mg by mouth daily.    [name] aspirin-acetaminophen-caffeine (EXCEDRIN MIGRAINE) 250-250-65 MG per tablet Take 2 tablets by mouth every 6 (six) hours as needed for headache.    . benazepril (LOTENSIN) 40 MG tablet Take 40 mg by mouth daily.    [name] buPROPion (WELLBUTRIN XL) 150 MG 24 hr tablet Take 150 mg by mouth 2 (two) times daily.    . clopidogrel (PLAVIX) 75 MG tablet Take 75 mg by mouth daily.    . DULoxetine (CYMBALTA) 30 MG capsule Take 90 mg by mouth daily.    . fenofibrate 54 MG tablet Take 54 mg by mouth every evening.    [name]  fluticasone (FLONASE) 50 MCG/ACT nasal spray Place 2 sprays into both nostrils daily.    [name] glipiZIDE-metformin (METAGLIP) 5-500 MG per tablet Take 2 tablets by mouth 2 (two) times daily.    . hydrochlorothiazide (MICROZIDE) 12.5 MG capsule Take 12.5 mg by mouth daily.    . insulin aspart (NOVOLOG) 100 UNIT/ML injection Inject 2-10 Units into the skin 3 (three) times daily with meals as needed for high blood sugar. Pt uses as needed per sliding scale.    . insulin glargine (LANTUS) 100 UNIT/ML injection Inject 41  Units into the skin at bedtime.    . Liraglutide (VICTOZA) 18 MG/3ML SOPN Inject 0.6 mg into the skin every evening.     . montelukast (SINGULAIR) 10 MG tablet Take 10 mg by mouth daily.    . Oxcarbazepine (TRILEPTAL) 300 MG tablet Take 300 mg by mouth 3 (three) times daily.    . pantoprazole (PROTONIX) 40 MG tablet Take 40 mg by mouth daily.    . pregabalin (LYRICA) 200 MG capsule Take 200 mg by mouth 3 (three) times daily.    . tamsulosin (FLOMAX) 0.4 MG CAPS capsule Take 0.4 mg by mouth daily.      Results for orders placed or performed during the hospital encounter of 12/18/14 (from the past 48 hour(s))  Blood Culture (routine x 2)     Status: None (Preliminary result)   Collection Time: 12/18/14  3:05 PM  Result Value Ref Range   Specimen Description BLOOD RIGHT HAND    Special Requests BOTTLES DRAWN AEROBIC AND ANAEROBIC 3CC    Culture      GRAM NEGATIVE RODS AEROBIC BOTTLE ONLY CRITICAL RESULT CALLED TO, READ BACK BY AND VERIFIED WITH: CHERYL GREEN,RN 12/19/2014 AT [place] BY JRS.    Report Status PENDING   Glucose, capillary     Status: Abnormal   Collection Time: 12/18/14  3:10 PM  Result Value Ref Range   Glucose-Capillary 263 (H) 65 - 99 mg/dL  Comprehensive metabolic panel     Status: Abnormal   Collection Time: 12/18/14  3:11 PM  Result Value Ref Range   Sodium 139 135 - 145 mmol/L   Potassium 4.1 3.5 - 5.1 mmol/L   Chloride 103 101 - 111 mmol/L   CO2 21 (L) 22 - 32 mmol/L   Glucose, Bld 248 (H) 65 - 99 mg/dL   BUN 20 6 - 20 mg/dL   Creatinine, Ser 1.18 0.61 - 1.24 mg/dL   Calcium 9.3 8.9 - 10.3 mg/dL   Total Protein 7.4 6.5 - 8.1 g/dL   Albumin 4.0 3.5 - 5.0 g/dL   AST 40 15 - 41 U/L   ALT 22 17 - 63 U/L   Alkaline Phosphatase 73 38 - 126 U/L   Total Bilirubin 0.8 0.3 - 1.2 mg/dL   GFR calc non Af Amer >60 >60 mL/min   GFR calc Af Amer >60 >60 mL/min    Comment: (NOTE) The eGFR has been calculated using the CKD EPI equation. This calculation has not been  validated in all clinical situations. eGFR's persistently <60 mL/min signify possible Chronic Kidney Disease.    Anion gap 15 5 - 15  CBC WITH DIFFERENTIAL     Status: Abnormal   Collection Time: 12/18/14  3:11 PM  Result Value Ref Range   WBC 7.4 3.8 - 10.6 K/uL   RBC 4.69 4.40 - 5.90 MIL/uL   Hemoglobin 12.1 (L) 13.0 - 18.0 g/dL   HCT 36.9 (L) 40.0 - 52.0 %  MCV 78.6 (L) 80.0 - 100.0 fL   MCH 25.7 (L) 26.0 - 34.0 pg   MCHC 32.7 32.0 - 36.0 g/dL   RDW 16.5 (H) 11.5 - 14.5 %   Platelets 156 150 - 440 K/uL   Neutrophils Relative % 93 %   Neutro Abs 6.8 (H) 1.4 - 6.5 K/uL   Lymphocytes Relative 5 %   Lymphs Abs 0.4 (L) 1.0 - 3.6 K/uL   Monocytes Relative 1 %   Monocytes Absolute 0.1 (L) 0.2 - 1.0 K/uL   Eosinophils Relative 0 %   Eosinophils Absolute 0.0 0 - 0.7 K/uL   Basophils Relative 1 %   Basophils Absolute 0.0 0 - 0.1 K/uL  Blood Culture (routine x 2)     Status: None (Preliminary result)   Collection Time: 12/18/14  3:11 PM  Result Value Ref Range   Specimen Description BLOOD RIGHT HAND    Special Requests      BOTTLES DRAWN AEROBIC AND ANAEROBIC 4CCAEROBIC,4CCANA   Culture NO GROWTH < 24 HOURS    Report Status PENDING   Lactic acid, plasma     Status: Abnormal   Collection Time: 12/18/14  3:11 PM  Result Value Ref Range   Lactic Acid, Venous 5.7 (HH) 0.5 - 2.0 mmol/L    Comment: CRITICAL RESULT CALLED TO, READ BACK BY AND VERIFIED WITH: RICKY HAROLD ON 12/18/14 AT 1633 BY TB. RESULT REPEATED AND VERIFIED   Lactic acid, plasma     Status: Abnormal   Collection Time: 12/18/14  3:11 PM  Result Value Ref Range   Lactic Acid, Venous 5.7 (HH) 0.5 - 2.0 mmol/L    Comment: CRITICAL RESULT CALLED TO, READ BACK BY AND VERIFIED WITH RICKY HAROLD ON 12/18/14 AT 1633 BY TB.   Troponin I     Status: Abnormal   Collection Time: 12/18/14  3:11 PM  Result Value Ref Range   Troponin I 0.17 (H) <0.031 ng/mL    Comment: READ BACK AND VERIFIED WITH RICKY HAROLD ON 12/18/14 AT 1621  BY TB.        PERSISTENTLY INCREASED TROPONIN VALUES IN THE RANGE OF 0.04-0.49 ng/mL CAN BE SEEN IN:       -UNSTABLE ANGINA       -CONGESTIVE HEART FAILURE       -MYOCARDITIS       -CHEST TRAUMA       -ARRYHTHMIAS       -LATE PRESENTING MYOCARDIAL INFARCTION       -COPD   CLINICAL FOLLOW-UP RECOMMENDED.   Protime-INR     Status: None   Collection Time: 12/18/14  3:11 PM  Result Value Ref Range   Prothrombin Time 14.7 11.4 - 15.0 seconds   INR 1.13   APTT     Status: None   Collection Time: 12/18/14  3:11 PM  Result Value Ref Range   aPTT 32 24 - 36 seconds  Troponin I     Status: Abnormal   Collection Time: 12/18/14  3:11 PM  Result Value Ref Range   Troponin I 2.25 (H) <0.031 ng/mL    Comment: RESULTS PREVIOUSLY CALLED WITH RICKY HAROLD ON 12/18/14 AT 1621 BY TB.        POSSIBLE MYOCARDIAL ISCHEMIA. SERIAL TESTING RECOMMENDED.   Urine culture     Status: None (Preliminary result)   Collection Time: 12/18/14  4:34 PM  Result Value Ref Range   Specimen Description URINE, CLEAN CATCH    Special Requests NONE    Culture      >=  100,000 COLONIES/mL GRAM NEGATIVE RODS IDENTIFICATION AND SUSCEPTIBILITIES TO FOLLOW    Report Status PENDING   Urinalysis complete, with microscopic ([facility] only)     Status: Abnormal   Collection Time: 12/18/14  4:34 PM  Result Value Ref Range   Color, Urine YELLOW (A) YELLOW   APPearance HAZY (A) CLEAR   Glucose, UA 150 (A) NEGATIVE mg/dL   Bilirubin Urine NEGATIVE NEGATIVE   Ketones, ur 1+ (A) NEGATIVE mg/dL   Specific Gravity, Urine 1.021 1.005 - 1.030   Hgb urine dipstick 2+ (A) NEGATIVE   pH 6.0 5.0 - 8.0   Protein, ur 100 (A) NEGATIVE mg/dL   Nitrite POSITIVE (A) NEGATIVE   Leukocytes, UA 2+ (A) NEGATIVE   RBC / HPF 0-5 0 - 5 RBC/hpf   WBC, UA 6-30 0 - 5 WBC/hpf   Bacteria, UA RARE (A) NONE SEEN   Squamous Epithelial / LPF 0-5 (A) NONE SEEN   Mucous PRESENT    Hyaline Casts, UA PRESENT   Glucose, capillary     Status: Abnormal    Collection Time: 12/18/14  8:24 PM  Result Value Ref Range   Glucose-Capillary 277 (H) 65 - 99 mg/dL   Comment 1 Notify RN   Troponin I     Status: Abnormal   Collection Time: 12/19/14 12:17 AM  Result Value Ref Range   Troponin I 7.38 (H) <0.031 ng/mL    Comment: READ BACK AND VERIFIED JASMINE SORIANO 12/19/2014 0053        POSSIBLE MYOCARDIAL ISCHEMIA. SERIAL TESTING RECOMMENDED.   Heparin level (unfractionated)     Status: Abnormal   Collection Time: 12/19/14 12:17 AM  Result Value Ref Range   Heparin Unfractionated 0.11 (L) 0.30 - 0.70 IU/mL    Comment:        IF HEPARIN RESULTS ARE BELOW EXPECTED VALUES, AND PATIENT DOSAGE HAS BEEN CONFIRMED, SUGGEST FOLLOW UP TESTING OF ANTITHROMBIN III LEVELS.   Basic metabolic panel     Status: Abnormal   Collection Time: 12/19/14 12:17 AM  Result Value Ref Range   Sodium 142 135 - 145 mmol/L   Potassium 3.7 3.5 - 5.1 mmol/L   Chloride 109 101 - 111 mmol/L   CO2 25 22 - 32 mmol/L   Glucose, Bld 192 (H) 65 - 99 mg/dL   BUN 20 6 - 20 mg/dL   Creatinine, Ser 1.35 (H) 0.61 - 1.24 mg/dL   Calcium 8.6 (L) 8.9 - 10.3 mg/dL   GFR calc non Af Amer 54 (L) >60 mL/min   GFR calc Af Amer >60 >60 mL/min    Comment: (NOTE) The eGFR has been calculated using the CKD EPI equation. This calculation has not been validated in all clinical situations. eGFR's persistently <60 mL/min signify possible Chronic Kidney Disease.    Anion gap 8 5 - 15  CBC     Status: Abnormal   Collection Time: 12/19/14 12:17 AM  Result Value Ref Range   WBC 7.1 3.8 - 10.6 K/uL   RBC 4.36 (L) 4.40 - 5.90 MIL/uL   Hemoglobin 11.2 (L) 13.0 - 18.0 g/dL   HCT 34.4 (L) 40.0 - 52.0 %   MCV 78.9 (L) 80.0 - 100.0 fL   MCH 25.7 (L) 26.0 - 34.0 pg   MCHC 32.6 32.0 - 36.0 g/dL   RDW 16.7 (H) 11.5 - 14.5 %   Platelets 133 (L) 150 - 440 K/uL  Glucose, capillary     Status: Abnormal   Collection Time: 12/19/14  3:38  AM  Result Value Ref Range   Glucose-Capillary 166 (H) 65  - 99 mg/dL  Heparin level (unfractionated)     Status: Abnormal   Collection Time: 12/19/14  6:53 AM  Result Value Ref Range   Heparin Unfractionated 0.24 (L) 0.30 - 0.70 IU/mL    Comment:        IF HEPARIN RESULTS ARE BELOW EXPECTED VALUES, AND PATIENT DOSAGE HAS BEEN CONFIRMED, SUGGEST FOLLOW UP TESTING OF ANTITHROMBIN III LEVELS.   Glucose, capillary     Status: Abnormal   Collection Time: 12/19/14  7:35 AM  Result Value Ref Range   Glucose-Capillary 188 (H) 65 - 99 mg/dL   Dg Pelvis 1-2 Views  12/18/2014   CLINICAL DATA:  Fall  EXAM: PELVIS - 1-2 VIEW  COMPARISON:  None.  FINDINGS: Two frontal view of the pelvis submitted. She no displaced fracture or subluxation. No radiopaque foreign body.  IMPRESSION: Negative.   Electronically Signed   By: Lahoma Crocker M.D.   On: 12/18/2014 16:26   Ct Head Wo Contrast  12/18/2014   CLINICAL DATA:  Found down, altered mental status  EXAM: CT HEAD WITHOUT CONTRAST  CT CERVICAL SPINE WITHOUT CONTRAST  TECHNIQUE: Multidetector CT imaging of the head and cervical spine was performed following the standard protocol without intravenous contrast. Multiplanar CT image reconstructions of the cervical spine were also generated.  COMPARISON:  Head CT 11/25/2013  FINDINGS: CT HEAD FINDINGS  No acute hemorrhage, infarct, or mass lesion is identified. No midline shift. Ventricles are normal in size. Orbits and paranasal sinuses are unremarkable. No skull fracture.  CT CERVICAL SPINE FINDINGS  Extensive streak artifact due to patient body habitus. Vertebral body heights are grossly maintained. Mild bilateral C5-C6 neural foraminal narrowing by disc degenerative change. No fracture or dislocation.  IMPRESSION: No acute intracranial abnormality.  No cervical spine fracture or dislocation.   Electronically Signed   By: Conchita Paris M.D.   On: 12/18/2014 16:34   Ct Cervical Spine Wo Contrast  12/18/2014   CLINICAL DATA:  Found down, altered mental status  EXAM: CT HEAD  WITHOUT CONTRAST  CT CERVICAL SPINE WITHOUT CONTRAST  TECHNIQUE: Multidetector CT imaging of the head and cervical spine was performed following the standard protocol without intravenous contrast. Multiplanar CT image reconstructions of the cervical spine were also generated.  COMPARISON:  Head CT 11/25/2013  FINDINGS: CT HEAD FINDINGS  No acute hemorrhage, infarct, or mass lesion is identified. No midline shift. Ventricles are normal in size. Orbits and paranasal sinuses are unremarkable. No skull fracture.  CT CERVICAL SPINE FINDINGS  Extensive streak artifact due to patient body habitus. Vertebral body heights are grossly maintained. Mild bilateral C5-C6 neural foraminal narrowing by disc degenerative change. No fracture or dislocation.  IMPRESSION: No acute intracranial abnormality.  No cervical spine fracture or dislocation.   Electronically Signed   By: Conchita Paris M.D.   On: 12/18/2014 16:34   Dg Chest Port 1 View  12/18/2014   CLINICAL DATA:  Found on the floor by sister. Patient's smells of urine. Skin is hot and diaphoretic. Increased respiratory rate.  EXAM: PORTABLE CHEST - 1 VIEW  COMPARISON:  02/06/2014  FINDINGS: The heart is enlarged. There is mild pulmonary vascular congestion but no overt edema. There are no focal consolidations or pleural effusions.  IMPRESSION: Cardiomegaly.   Electronically Signed   By: Nolon Nations M.D.   On: 12/18/2014 16:08    Review of Systems  Constitutional: Positive for chills, malaise/fatigue and  diaphoresis.  Eyes: Negative for double vision and photophobia.  Respiratory: Positive for shortness of breath. Negative for cough and hemoptysis.   Cardiovascular: Negative for chest pain, palpitations and orthopnea.  Gastrointestinal: Negative for vomiting and abdominal pain.  Genitourinary: Positive for dysuria and frequency.  Neurological: Positive for dizziness and weakness.    There were no vitals taken for this visit. Physical Exam   Constitutional: He is oriented to person, place, and time.  HENT:  Head: Normocephalic and atraumatic.  Eyes: Conjunctivae are normal. Pupils are equal, round, and reactive to light. Left eye exhibits no discharge.  Neck: Normal range of motion. Neck supple. No tracheal deviation present. No thyromegaly present.  Cardiovascular: Regular rhythm.   Murmur (Soft systolic murmur noted) heard. Respiratory: Effort normal and breath sounds normal.  GI: Soft. Bowel sounds are normal. He exhibits distension. There is no tenderness.  Musculoskeletal: He exhibits no edema or tenderness.  Neurological: He is alert and oriented to person, place, and time.     Assessment/Plan Acute anterolateral wall MI with atypical presentation Multivessel CAD Hypertension Diabetes mellitus Obstructive sleep apnea Morbid obesity History of CVA in the past Positive family history of coronary artery disease GERD Resolving UTI Depression Plan Discussed with patient and family at length regarding PTCA stenting its risk and benefits i.e. death MI stroke need for emergency CABG risk of restenosis local vascular complications etc. and consents for PCI.  Charolette Forward 12/19/2014, 3:33 PM

## 2014-12-19 NOTE — Care Management (Signed)
Informed that patient will not return to the nursing unit.  He will be transferred to [facility] from the cath lab for high risk PCI

## 2014-12-19 NOTE — Progress Notes (Signed)
SUBJECTIVE: passed out yesterday but no chest pains   Filed Vitals:   12/18/14 1738 12/18/14 1830 12/18/14 1928 12/19/14 0410  BP: 175/100 145/64 136/68 118/75  Pulse: 90 89 92 70  Temp:  98.1 F (36.7 C) 100.3 F (37.9 C) 99.2 F (37.3 C)  TempSrc:  Oral Oral Oral  Resp: 18 18 18 18   Height:      Weight:    137.848 kg (303 lb 14.4 oz)  SpO2: 97% 99% 97% 98%    Intake/Output Summary (Last 24 hours) at 12/19/14 0911 Last data filed at 12/19/14 0820  Gross per 24 hour  Intake   1250 ml  Output    500 ml  Net    750 ml    LABS: Basic Metabolic Panel:  Recent Labs  12/18/14 1511 12/19/14 0017  NA 139 142  K 4.1 3.7  CL 103 109  CO2 21* 25  GLUCOSE 248* 192*  BUN 20 20  CREATININE 1.18 1.35*  CALCIUM 9.3 8.6*   Liver Function Tests:  Recent Labs  12/18/14 1511  AST 40  ALT 22  ALKPHOS 73  BILITOT 0.8  PROT 7.4  ALBUMIN 4.0   No results for input(s): LIPASE, AMYLASE in the last 72 hours. CBC:  Recent Labs  12/18/14 1511 12/19/14 0017  WBC 7.4 7.1  NEUTROABS 6.8*  --   HGB 12.1* 11.2*  HCT 36.9* 34.4*  MCV 78.6* 78.9*  PLT 156 133*   Cardiac Enzymes:  Recent Labs  12/18/14 1511 12/19/14 0017  TROPONINI 2.25*  0.17* 7.38*   BNP: Invalid input(s): POCBNP D-Dimer: No results for input(s): DDIMER in the last 72 hours. Hemoglobin A1C: No results for input(s): HGBA1C in the last 72 hours. Fasting Lipid Panel: No results for input(s): CHOL, HDL, LDLCALC, TRIG, CHOLHDL, LDLDIRECT in the last 72 hours. Thyroid Function Tests: No results for input(s): TSH, T4TOTAL, T3FREE, THYROIDAB in the last 72 hours.  Invalid input(s): FREET3 Anemia Panel: No results for input(s): VITAMINB12, FOLATE, FERRITIN, TIBC, IRON, RETICCTPCT in the last 72 hours.   PHYSICAL EXAM General: Well developed, well nourished, in no acute distress HEENT:  Normocephalic and atramatic Neck:  No JVD.  Lungs: Clear bilaterally to auscultation and percussion. Heart: HRRR  . Normal S1 and S2 without gallops or murmurs.  Abdomen: Bowel sounds are positive, abdomen soft and non-tender  Msk:  Back normal, normal gait. Normal strength and tone for age. Extremities: No clubbing, cyanosis or edema.   Neuro: Alert and oriented X 3. Psych:  Good affect, responds appropriately  TELEMETRY: NSR  ASSESSMENT AND PLAN: NSTEMI, troponin went up tp 7, thus will set up cath.  Active Problems:   SIRS (systemic inflammatory response syndrome)    Dionisio Jamarea, MD, [facility] 12/19/2014 9:11 AM

## 2014-12-19 NOTE — Progress Notes (Signed)
Received pt from the cath lab with Fr 6 sheath @ R groin, intact, no bleeding, no hematoma. Pt advised & instructed for bedrest. Given ice chips as per pt request. NT started to do EKG with the pt when the pt became verbally aggressive, uncooperative & agitated pulling all the EKG cables, stating"If you do not pull the sheath in my groin now I will pull it & go home". Dr. Terrence Dupont informed of the situation. Morphine 2 mg IV given as ordered. Pt's sister Jeani Hawking) & brother-in-law came at bedside & tried to calm pt down. Pt calm down 10 mins after Morphine given. As per the pt sister pt is living with them & warn [place] stated "he is a drug addict & if he can not get the drugs we will see those aggressive behavior & I have to deal with his behavior the whole time he is with me". Will continue to monitor pt & R groin site.

## 2014-12-19 NOTE — Progress Notes (Signed)
PT Cancellation Note  Patient Details Name: Victor Wise MRN: [id number] DOB: 1950/10/12   Cancelled Treatment:    Reason Eval/Treat Not Completed: Patient not medically ready. Patient with ST elevation upon admission EKG and has had up-trending troponin levels since his admission (0.17 --> 2.25 --> 7.38). Given this, PT will await mobility evaluation until troponin levels begin to trend downwards. PT will continue to monitor patient case and attempt mobility evaluation when patient is appropriate and able for mobility evaluation.   Kerman Passey, PT, DPT    12/19/2014, 8:31 AM

## 2014-12-19 NOTE — Progress Notes (Signed)
ANTICOAGULATION CONSULT NOTE - Follow Up  Pharmacy Consult for Unfractionated Heparin Indication: R/o ACS  Allergies  Allergen Reactions  . Amoxicillin Shortness Of Breath and Itching  . Aspirin Itching and Other (See Comments)    Only in large doses  . Crestor [Rosuvastatin Calcium] Other (See Comments)    Pt states that it caused his arms and legs to "lock up".  . Hydralazine Other (See Comments)    Reaction:  Cramping   . Penicillins Itching  . Sulfa Antibiotics Itching  . Yellow Dyes (Non-Tartrazine) Itching    Patient Measurements: Height: 5\' 9"  (175.3 cm) Weight: (!) 303 lb 14.4 oz (137.848 kg) IBW/kg (Calculated) : 70.7 Heparin Dosing Weight: 103kg  Vital Signs: Temp: 99.2 F (37.3 C) (08/05 0410) Temp Source: Oral (08/05 0410) BP: 118/75 mmHg (08/05 0410) Pulse Rate: 70 (08/05 0410)  Labs:  Recent Labs  12/18/14 1511 12/19/14 0017 12/19/14 0653  HGB 12.1* 11.2*  --   HCT 36.9* 34.4*  --   PLT 156 133*  --   APTT 32  --   --   LABPROT 14.7  --   --   INR 1.13  --   --   HEPARINUNFRC  --  0.11* 0.24*  CREATININE 1.18 1.35*  --   TROPONINI 2.25*  0.17* 7.38*  --     Estimated Creatinine Clearance: 77.2 mL/min (by C-G formula based on Cr of 1.35).   Medical History: Past Medical History  Diagnosis Date  . Diabetes   . Hypertension   . BPH (benign prostatic hyperplasia)   . Osteomyelitis of foot   . Morbid obesity   . Obstructive sleep apnea   . Chronic back pain   . Depression   . UTI (lower urinary tract infection)     Medications:  Prescriptions prior to admission  Medication Sig Dispense Refill Last Dose  . amLODipine (NORVASC) 10 MG tablet Take 10 mg by mouth daily.   unknown at unknown  . aspirin 81 MG chewable tablet Chew 81 mg by mouth daily.   unknown at unknown  . aspirin-acetaminophen-caffeine (EXCEDRIN MIGRAINE) 250-250-65 MG per tablet Take 2 tablets by mouth every 6 (six) hours as needed for headache.   PRN at PRN  .  benazepril (LOTENSIN) 40 MG tablet Take 40 mg by mouth daily.   unknown at unknown  . buPROPion (WELLBUTRIN XL) 150 MG 24 hr tablet Take 150 mg by mouth 2 (two) times daily.   unknown at unknown  . clopidogrel (PLAVIX) 75 MG tablet Take 75 mg by mouth daily.   unknown at unknown  . DULoxetine (CYMBALTA) 30 MG capsule Take 90 mg by mouth daily.   unknown at unknown  . fenofibrate 54 MG tablet Take 54 mg by mouth every evening.   unknown at unknown  . fluticasone (FLONASE) 50 MCG/ACT nasal spray Place 2 sprays into both nostrils daily.   unknown at unknown  . glipiZIDE-metformin (METAGLIP) 5-500 MG per tablet Take 2 tablets by mouth 2 (two) times daily.   unknown at unknown  . hydrochlorothiazide (MICROZIDE) 12.5 MG capsule Take 12.5 mg by mouth daily.   unknown at unknown  . insulin aspart (NOVOLOG) 100 UNIT/ML injection Inject 2-10 Units into the skin 3 (three) times daily with meals as needed for high blood sugar. Pt uses as needed per sliding scale.   unknown at unknown  . insulin glargine (LANTUS) 100 UNIT/ML injection Inject 41 Units into the skin at bedtime.   unknown at unknown  .  Liraglutide (VICTOZA) 18 MG/3ML SOPN Inject 0.6 mg into the skin every evening.    unknown at unknown  . montelukast (SINGULAIR) 10 MG tablet Take 10 mg by mouth daily.   unknown at unknown  . Oxcarbazepine (TRILEPTAL) 300 MG tablet Take 300 mg by mouth 3 (three) times daily.   unknown at unknown  . pantoprazole (PROTONIX) 40 MG tablet Take 40 mg by mouth daily.   unknown at unknown  . pregabalin (LYRICA) 200 MG capsule Take 200 mg by mouth 3 (three) times daily.   unknown at unknown  . tamsulosin (FLOMAX) 0.4 MG CAPS capsule Take 0.4 mg by mouth daily.   unknown at unknown    Assessment: Pharmacy consulted to dose heparin for ACS in this 64 year old male.  Goal of Therapy:  Heparin Level: 0.3 - 0.7 R/o ACS   Plan:  Current orders for heparin 1600units/hr.   Heparin level was low at 0.24. Ordered bolus of  1500 units x 1 and increased drip to 1800 units/hr. Will recheck heparin level in 6 hours.  Pharmacy to follow per consult  Rexene Edison, PharmD Clinical Pharmacist  12/19/2014,7:50 AM

## 2014-12-19 NOTE — Progress Notes (Signed)
Dr. Fay Records aware of troponin of 7.38, Pt seen by cardiologist yesterday. No new orders received. Follow with morning doctors per Dr. Fay Records.

## 2014-12-20 ENCOUNTER — Encounter (HOSPITAL_COMMUNITY): Payer: Self-pay | Admitting: *Deleted

## 2014-12-20 DIAGNOSIS — F329 Major depressive disorder, single episode, unspecified: Secondary | ICD-10-CM | POA: Diagnosis present

## 2014-12-20 DIAGNOSIS — E780 Pure hypercholesterolemia: Secondary | ICD-10-CM | POA: Diagnosis present

## 2014-12-20 DIAGNOSIS — Z7982 Long term (current) use of aspirin: Secondary | ICD-10-CM | POA: Diagnosis not present

## 2014-12-20 DIAGNOSIS — Z794 Long term (current) use of insulin: Secondary | ICD-10-CM | POA: Diagnosis not present

## 2014-12-20 DIAGNOSIS — E1165 Type 2 diabetes mellitus with hyperglycemia: Secondary | ICD-10-CM | POA: Diagnosis present

## 2014-12-20 DIAGNOSIS — K219 Gastro-esophageal reflux disease without esophagitis: Secondary | ICD-10-CM | POA: Diagnosis present

## 2014-12-20 DIAGNOSIS — Z6841 Body Mass Index (BMI) 40.0 and over, adult: Secondary | ICD-10-CM | POA: Diagnosis not present

## 2014-12-20 DIAGNOSIS — Z8249 Family history of ischemic heart disease and other diseases of the circulatory system: Secondary | ICD-10-CM | POA: Diagnosis not present

## 2014-12-20 DIAGNOSIS — Z888 Allergy status to other drugs, medicaments and biological substances status: Secondary | ICD-10-CM | POA: Diagnosis not present

## 2014-12-20 DIAGNOSIS — N400 Enlarged prostate without lower urinary tract symptoms: Secondary | ICD-10-CM | POA: Diagnosis present

## 2014-12-20 DIAGNOSIS — I10 Essential (primary) hypertension: Secondary | ICD-10-CM | POA: Diagnosis present

## 2014-12-20 DIAGNOSIS — N390 Urinary tract infection, site not specified: Secondary | ICD-10-CM | POA: Diagnosis present

## 2014-12-20 DIAGNOSIS — G4733 Obstructive sleep apnea (adult) (pediatric): Secondary | ICD-10-CM | POA: Diagnosis present

## 2014-12-20 DIAGNOSIS — Z882 Allergy status to sulfonamides status: Secondary | ICD-10-CM | POA: Diagnosis not present

## 2014-12-20 DIAGNOSIS — Z880 Allergy status to penicillin: Secondary | ICD-10-CM | POA: Diagnosis not present

## 2014-12-20 DIAGNOSIS — Z87891 Personal history of nicotine dependence: Secondary | ICD-10-CM | POA: Diagnosis not present

## 2014-12-20 DIAGNOSIS — Z7902 Long term (current) use of antithrombotics/antiplatelets: Secondary | ICD-10-CM | POA: Diagnosis not present

## 2014-12-20 DIAGNOSIS — I2109 ST elevation (STEMI) myocardial infarction involving other coronary artery of anterior wall: Secondary | ICD-10-CM | POA: Diagnosis present

## 2014-12-20 DIAGNOSIS — Z91048 Other nonmedicinal substance allergy status: Secondary | ICD-10-CM | POA: Diagnosis not present

## 2014-12-20 DIAGNOSIS — R0602 Shortness of breath: Secondary | ICD-10-CM | POA: Diagnosis present

## 2014-12-20 DIAGNOSIS — M1990 Unspecified osteoarthritis, unspecified site: Secondary | ICD-10-CM | POA: Diagnosis present

## 2014-12-20 DIAGNOSIS — I2510 Atherosclerotic heart disease of native coronary artery without angina pectoris: Secondary | ICD-10-CM | POA: Diagnosis present

## 2014-12-20 DIAGNOSIS — Z886 Allergy status to analgesic agent status: Secondary | ICD-10-CM | POA: Diagnosis not present

## 2014-12-20 DIAGNOSIS — Z8673 Personal history of transient ischemic attack (TIA), and cerebral infarction without residual deficits: Secondary | ICD-10-CM | POA: Diagnosis not present

## 2014-12-20 DIAGNOSIS — Z79899 Other long term (current) drug therapy: Secondary | ICD-10-CM | POA: Diagnosis not present

## 2014-12-20 LAB — BASIC METABOLIC PANEL
Anion gap: 9 (ref 5–15)
BUN: 19 mg/dL (ref 6–20)
CALCIUM: 7.6 mg/dL — AB (ref 8.9–10.3)
CHLORIDE: 107 mmol/L (ref 101–111)
CO2: 21 mmol/L — ABNORMAL LOW (ref 22–32)
CREATININE: 1.55 mg/dL — AB (ref 0.61–1.24)
GFR calc Af Amer: 53 mL/min — ABNORMAL LOW (ref >60)
GFR, EST NON AFRICAN AMERICAN: 46 mL/min — AB (ref >60)
Glucose, Bld: 312 mg/dL — ABNORMAL HIGH (ref 65–99)
Potassium: 3.5 mmol/L (ref 3.5–5.1)
Sodium: 137 mmol/L (ref 135–145)

## 2014-12-20 LAB — CBC
HCT: 30.9 % — ABNORMAL LOW (ref 39.0–52.0)
Hemoglobin: 9.8 g/dL — ABNORMAL LOW (ref 13.0–17.0)
MCH: 25.8 pg — AB (ref 26.0–34.0)
MCHC: 31.7 g/dL (ref 30.0–36.0)
MCV: 81.3 fL (ref 78.0–100.0)
Platelets: 116 10*3/uL — ABNORMAL LOW (ref 150–400)
RBC: 3.8 MIL/uL — AB (ref 4.22–5.81)
RDW: 17.4 % — AB (ref 11.5–15.5)
WBC: 3.8 10*3/uL — ABNORMAL LOW (ref 4.0–10.5)

## 2014-12-20 LAB — TROPONIN I
TROPONIN I: 4.6 ng/mL — AB (ref <0.031)
Troponin I: 3.22 ng/mL (ref <0.031)

## 2014-12-20 LAB — GLUCOSE, CAPILLARY
GLUCOSE-CAPILLARY: 296 mg/dL — AB (ref 65–99)
Glucose-Capillary: 246 mg/dL — ABNORMAL HIGH (ref 65–99)
Glucose-Capillary: 273 mg/dL — ABNORMAL HIGH (ref 65–99)
Glucose-Capillary: 281 mg/dL — ABNORMAL HIGH (ref 65–99)

## 2014-12-20 LAB — URINE CULTURE: Culture: >=100000

## 2014-12-20 LAB — LIPID PANEL
Cholesterol: 147 mg/dL (ref 0–200)
HDL: 14 mg/dL — AB (ref >40)
LDL Cholesterol: UNDETERMINED mg/dL (ref 0–99)
Total CHOL/HDL Ratio: 10.5 RATIO
Triglycerides: 512 mg/dL — ABNORMAL HIGH (ref <150)
VLDL: UNDETERMINED mg/dL (ref 0–40)

## 2014-12-20 MED ORDER — LIVING WELL WITH DIABETES BOOK
Freq: Once | Status: AC
  Administered 2014-12-20
  Filled 2014-12-20: qty 1

## 2014-12-20 MED ORDER — ANGIOPLASTY BOOK
Freq: Once | Status: AC
  Administered 2014-12-20
  Filled 2014-12-20: qty 1

## 2014-12-20 MED ORDER — OXYCODONE HCL ER 20 MG PO T12A
20.0000 mg | EXTENDED_RELEASE_TABLET | Freq: Two times a day (BID) | ORAL | Status: DC
  Administered 2014-12-20 – 2014-12-21 (×2): 20 mg via ORAL
  Filled 2014-12-20 (×2): qty 2

## 2014-12-20 MED ORDER — ASPIRIN-ACETAMINOPHEN-CAFFEINE 250-250-65 MG PO TABS
2.0000 | ORAL_TABLET | Freq: Four times a day (QID) | ORAL | Status: DC | PRN
  Filled 2014-12-20: qty 2

## 2014-12-20 MED ORDER — SODIUM CHLORIDE 0.9 % IV SOLN: 250.0000 mL | INTRAVENOUS | Status: DC | PRN

## 2014-12-20 MED ORDER — ASPIRIN-ACETAMINOPHEN-CAFFEINE 250-250-65 MG PO TABS
2.0000 | ORAL_TABLET | ORAL | Status: AC
  Administered 2014-12-20: 05:00:00 2 via ORAL
  Filled 2014-12-20: qty 2

## 2014-12-20 NOTE — Progress Notes (Signed)
Ref: Lavera Guise, MD   Subjective:  Feeling better. No chest pain. Afebrile. Elevated troponin-I, glucose and BUN/Cr.  Objective:  Vital Signs in the last 24 hours: Temp:  [97.1 F (36.2 C)-98.7 F (37.1 C)] 98.7 F (37.1 C) (08/06 0810) Pulse Rate:  [56-79] 56 (08/06 0810) Cardiac Rhythm:  [-] Sinus bradycardia (08/06 0800) Resp:  [12-28] 20 (08/06 0810) BP: (92-134)/(30-109) 102/30 mmHg (08/06 0810) SpO2:  [2 %-100 %] 92 % (08/06 0810) Weight:  [142.7 kg (314 lb 9.5 oz)] 142.7 kg (314 lb 9.5 oz) (08/06 0001)  Physical Exam: BP Readings from Last 1 Encounters:  12/20/14 102/30    Wt Readings from Last 1 Encounters:  12/20/14 142.7 kg (314 lb 9.5 oz)    Weight change:   HEENT: [place]/AT, Eyes- PERL, EOMI, Conjunctiva-Pale pink, Sclera-Non-icteric Neck: No JVD, No bruit, Trachea midline. Lungs:  Clear, Bilateral. Cardiac:  Regular rhythm, normal S1 and S2, no S3. II/VI systolic murmur. Abdomen:  Soft, non-tender. Extremities:  No edema present. No cyanosis. No clubbing. CNS: AxOx3, Cranial nerves grossly intact, moves all 4 extremities.  Skin: Warm and dry.   Intake/Output from previous day: 08/05 0701 - 08/06 0700 In: 775 [I.V.:775] Out: 900 [Urine:900]    Lab Results: BMET    Component Value Date/Time   NA 137 12/20/2014 0304   NA 142 12/19/2014 0017   NA 139 12/18/2014 1511   NA 138 02/06/2014 1628   NA 135* 12/16/2013 0324   NA 138 12/13/2013 0443   K 3.5 12/20/2014 0304   K 3.7 12/19/2014 0017   K 4.1 12/18/2014 1511   K 4.6 02/06/2014 1628   K 4.3 12/16/2013 0324   K 4.3 12/13/2013 0443   CL 107 12/20/2014 0304   CL 109 12/19/2014 0017   CL 103 12/18/2014 1511   CL 102 02/06/2014 1628   CL 101 12/16/2013 0324   CL 105 12/13/2013 0443   CO2 21* 12/20/2014 0304   CO2 25 12/19/2014 0017   CO2 21* 12/18/2014 1511   CO2 30 02/06/2014 1628   CO2 28 12/16/2013 0324   CO2 25 12/13/2013 0443   GLUCOSE 312* 12/20/2014 0304   GLUCOSE 192* 12/19/2014 0017    GLUCOSE 248* 12/18/2014 1511   GLUCOSE 139* 02/06/2014 1628   GLUCOSE 236* 12/16/2013 0324   GLUCOSE 233* 12/13/2013 0443   BUN 19 12/20/2014 0304   BUN 20 12/19/2014 0017   BUN 20 12/18/2014 1511   BUN 31* 02/06/2014 1628   BUN 14 12/16/2013 0324   BUN 22* 12/13/2013 0443   CREATININE 1.55* 12/20/2014 0304   CREATININE 1.35* 12/19/2014 0017   CREATININE 1.18 12/18/2014 1511   CREATININE 1.16 02/06/2014 1628   CREATININE 1.01 12/18/2013 0435   CREATININE 0.97 12/16/2013 0324   CALCIUM 7.6* 12/20/2014 0304   CALCIUM 8.6* 12/19/2014 0017   CALCIUM 9.3 12/18/2014 1511   CALCIUM 8.6 02/06/2014 1628   CALCIUM 8.8 12/16/2013 0324   CALCIUM 8.5 12/13/2013 0443   GFRNONAA 46* 12/20/2014 0304   GFRNONAA 54* 12/19/2014 0017   GFRNONAA >60 12/18/2014 1511   GFRNONAA >60 12/18/2013 0435   GFRNONAA >60 12/16/2013 0324   GFRNONAA >60 12/15/2013 0857   GFRAA 53* 12/20/2014 0304   GFRAA >60 12/19/2014 0017   GFRAA >60 12/18/2014 1511   GFRAA >60 12/18/2013 0435   GFRAA >60 12/16/2013 0324   GFRAA >60 12/15/2013 0857   CBC    Component Value Date/Time   WBC 3.8* 12/20/2014 0304  WBC 6.3 02/06/2014 1628   RBC 3.80* 12/20/2014 0304   RBC 4.58 02/06/2014 1628   HGB 9.8* 12/20/2014 0304   HGB 11.6* 02/06/2014 1628   HCT 30.9* 12/20/2014 0304   HCT 35.9* 02/06/2014 1628   PLT 116* 12/20/2014 0304   PLT 221 02/06/2014 1628   MCV 81.3 12/20/2014 0304   MCV 78* 02/06/2014 1628   MCH 25.8* 12/20/2014 0304   MCH 25.3* 02/06/2014 1628   MCHC 31.7 12/20/2014 0304   MCHC 32.3 02/06/2014 1628   RDW 17.4* 12/20/2014 0304   RDW 17.9* 02/06/2014 1628   LYMPHSABS 0.4* 12/18/2014 1511   LYMPHSABS 1.7 12/16/2013 0324   MONOABS 0.1* 12/18/2014 1511   MONOABS 0.3 12/16/2013 0324   EOSABS 0.0 12/18/2014 1511   EOSABS 0.2 12/16/2013 0324   BASOSABS 0.0 12/18/2014 1511   BASOSABS 0.1 12/16/2013 0324   HEPATIC Function Panel  Recent Labs  02/06/14 1628 12/18/14 1511  PROT 6.8 7.4    HEMOGLOBIN A1C No components found for: HGA1C,  MPG CARDIAC ENZYMES Lab Results  Component Value Date   TROPONINI 3.22* 12/20/2014   TROPONINI 4.60* 12/19/2014   TROPONINI 7.38* 12/19/2014   BNP No results for input(s): PROBNP in the last 8760 hours. TSH No results for input(s): TSH in the last 8760 hours. CHOLESTEROL  Recent Labs  12/20/14 0304  CHOL 147    Scheduled Meds: . aspirin EC  81 mg Oral Daily  . aspirin EC  81 mg Oral Daily  . atorvastatin  80 mg Oral q1800  . bivalirudin  0.1 mg/kg Intravenous Once  . buPROPion  150 mg Oral BID  . clopidogrel  150 mg Oral Daily  . clopidogrel  150 mg Oral Daily  . DULoxetine  90 mg Oral Daily  . fenofibrate  54 mg Oral QPM  . fluticasone  2 spray Each Nare Daily  . insulin aspart  0-9 Units Subcutaneous TID WC  . insulin glargine  20 Units Subcutaneous QHS  . levofloxacin  750 mg Oral Q24H  . metoprolol tartrate  12.5 mg Oral BID  . pantoprazole  40 mg Oral Daily  . pregabalin  100 mg Oral BID  . sodium chloride  3 mL Intravenous Q12H  . sodium chloride  3 mL Intravenous Q12H  . tamsulosin  0.4 mg Oral Daily   Continuous Infusions: . bivalirudin (ANGIOMAX) infusion 5 mg/mL (Cath Lab,ACS,PCI indication)    . bivalirudin (ANGIOMAX) infusion 5 mg/mL (Cath Lab,ACS,PCI indication)    . bivalirudin (ANGIOMAX) infusion 5 mg/mL (Cath Lab,ACS,PCI indication) Stopped (12/19/14 1945)  . 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    LOS: 1 day     Dixie Dials  MD  12/20/2014, 10:02 AM

## 2014-12-20 NOTE — Progress Notes (Signed)
Patient has been calm and cooperative this morning with no compliance issues.

## 2014-12-20 NOTE — Progress Notes (Addendum)
CARDIAC REHAB PHASE I   PRE:  Rate/Rhythm: 55 SB  BP:  Supine: 102/30  Sitting:   Standing:    SaO2:   MODE:  Ambulation: 220 ft   POST:  Rate/Rhythm: 76 SR  BP:  Supine:   Sitting: 139/62  Standing:    SaO2:  0800-0906 Pt walked 220 ft using hand rail occasionally with steady gait independently after I put his shoes on. No CP, slight DOE. Asked pt if he felt better with activity since his stents placed and he said yes. Encouraged pt to walk as tolerated using cane or walker that he has at home. Did not give written ex ed due to mobility limitation. Discussed CRP 2 and pt given brochure for [employer] but he stated he wanted to try Silver Sneakers first. Encouraged him to discuss CRP 2 with cardiologist if he changes his mind. Gave diabetic and heart healthy diets. Pt stated he likes to keep A1C under 7. Asked pt if his triglycerides have been high and he said yes. Encouraged lots of green vegetables. When discussing low carb foods pt stated he had been to diabetic classes, sister is Therapist, sports and he lives with family members with diabetes. Pt did not feel he needed diet ed so just left handouts. Stressed importance of plavix with stents and pt stated he took it with stents in his legs. Stated he is compliant with taking meds. Pt very cooperative and pleasant during visit. Pt did not want to discuss NTG use. Stated if he ever needed it he would call cardiologist in [place]. To recliner with call bell.   Graylon Good, RN BSN  12/20/2014 8:57 AM

## 2014-12-20 NOTE — Progress Notes (Signed)
Called by staffing regarding pt aggressive behavior.  Security responded as well.  Pt complaining of being in pain and not being able to get any rest.  Reported that he was called a liar about the noises in his room.  Also reported that he has not been well informed about his care from the physicians.  I advised that he would need to discuss his physician concerns when they rounded.  Also advised that the nurses are only going to give medicine that the physician prescribes.  Once he received the prn dose of pain medicine he calmed down and was quite pleasant and cooperative.  The IV pole was removed from the room completely to allow for no confusion as to what is alarming.  Pt was placed back on cardiac monitor and was left resting in bed with no other issues.

## 2014-12-20 NOTE — Progress Notes (Signed)
Site area: right groin  Site Prior to Removal:  Level 0  Pressure Applied For 15 MINUTES    Minutes Beginning at 23:37  Manual:   Yes.    Patient Status During Pull:  WNL  Post Pull Groin Site:  Level 0  Post Pull Instructions Given:  Yes.    Post Pull Pulses Present:  Yes.    Dressing Applied:  Yes.    Comments:

## 2014-12-20 NOTE — Progress Notes (Signed)
Called upon pt's room at 2am & pt started complaining of IV pump beeping, ringing bell 4x no one answers. Pt told that IV pump was off since 1:30 AM. Pt got up mad, took his cardiac leads off/ monitor & removed his gown, became verbally aggressive says " I'm not a liar, I know what is left & right". Supervisor Jess informed & came to talk to the pt. Security called. Pt was informed of his bedrest up at 4am . Pt is non complaint. Complaining  he did not get his medicine & that he wants pain meds. Morphine IV given as PRN dose. After the morphine dose pt started to calm down.  Placed back his gown & monitor. Given a drink & went back to bed. Kept pt monitored.

## 2014-12-21 LAB — CBC
HCT: 29 % — ABNORMAL LOW (ref 39.0–52.0)
Hemoglobin: 9.4 g/dL — ABNORMAL LOW (ref 13.0–17.0)
MCH: 25.8 pg — ABNORMAL LOW (ref 26.0–34.0)
MCHC: 32.4 g/dL (ref 30.0–36.0)
MCV: 79.7 fL (ref 78.0–100.0)
Platelets: 105 10*3/uL — ABNORMAL LOW (ref 150–400)
RBC: 3.64 MIL/uL — ABNORMAL LOW (ref 4.22–5.81)
RDW: 16.8 % — ABNORMAL HIGH (ref 11.5–15.5)
WBC: 3.5 10*3/uL — ABNORMAL LOW (ref 4.0–10.5)

## 2014-12-21 LAB — BASIC METABOLIC PANEL
Anion gap: 8 (ref 5–15)
BUN: 20 mg/dL (ref 6–20)
CALCIUM: 8.2 mg/dL — AB (ref 8.9–10.3)
CHLORIDE: 108 mmol/L (ref 101–111)
CO2: 24 mmol/L (ref 22–32)
CREATININE: 1.32 mg/dL — AB (ref 0.61–1.24)
GFR calc Af Amer: >60 mL/min (ref >60)
GFR calc non Af Amer: 56 mL/min — ABNORMAL LOW (ref >60)
Glucose, Bld: 277 mg/dL — ABNORMAL HIGH (ref 65–99)
Potassium: 3.8 mmol/L (ref 3.5–5.1)
SODIUM: 140 mmol/L (ref 135–145)

## 2014-12-21 LAB — GLUCOSE, CAPILLARY
GLUCOSE-CAPILLARY: 255 mg/dL — AB (ref 65–99)
Glucose-Capillary: 230 mg/dL — ABNORMAL HIGH (ref 65–99)

## 2014-12-21 MED ORDER — METOPROLOL TARTRATE 25 MG PO TABS: 12.5000 mg | ORAL_TABLET | Freq: Two times a day (BID) | ORAL | Status: DC

## 2014-12-21 MED ORDER — INSULIN GLARGINE 100 UNIT/ML ~~LOC~~ SOLN
5.0000 [IU] | Freq: Once | SUBCUTANEOUS | Status: AC
Start: 2014-12-21 — End: 2014-12-21
  Administered 2014-12-21: 5 [IU] via SUBCUTANEOUS
  Filled 2014-12-21: qty 0.05

## 2014-12-21 MED ORDER — NITROGLYCERIN 0.4 MG SL SUBL: 0.4000 mg | SUBLINGUAL_TABLET | SUBLINGUAL | Status: DC | PRN

## 2014-12-21 MED ORDER — LEVOFLOXACIN 750 MG PO TABS: 750.0000 mg | ORAL_TABLET | ORAL | Status: DC

## 2014-12-21 MED ORDER — INSULIN GLARGINE 100 UNIT/ML ~~LOC~~ SOLN
25.0000 [IU] | Freq: Every day | SUBCUTANEOUS | Status: DC
  Filled 2014-12-21: qty 0.25

## 2014-12-21 NOTE — Discharge Summary (Signed)
Physician Discharge Summary  Patient ID: Victor Wise MRN: [id number] DOB/AGE: [birth year] 64 y.o.  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 Hypertension Diabetes mellitus, II, uncontrolled Obstructive sleep apnea Morbid obesity History of CVA in the past Positive family history of coronary artery disease GERD Resolving UTI Depression  Discharge Diagnoses:  Principle Problem: * Acute anterolateral wall MI, subsequent care and with atypical presentation * Multivessel CAD S/P Multi-stent placement Hypertension Diabetes mellitus, II, uncontrolled Obstructive sleep apnea Morbid obesity History of CVA in the past Positive family history of coronary artery disease GERD Resolving UTI Depression  Discharged Condition: fair  Hospital Course: Patient is 64 year old male with past medical history significant for multiple medical problems i.e. hypertension, type 2 diabetes mellitus, hypercholesteremia, GERD, obstructive sleep apnea, depression, degenerative joint disease, morbid obesity, history of CVA in the past, history of recurrent UTI, positive family history of coronary artery disease, was admitted at [facility] following a fall from his bed associated with generalized weakness and inability to get up subsequently developed shortness of breath, diaphoresis and was noted to have high-grade fever which was attributed to UTI. He had minimally elevated troponin I and EKG abnormality of acute anterior wall MI and inferioe wall MI. Patient denied any chest pain, nausea or vomiting. He had multiple stress tests in the past which were negative. Patient subsequently underwent cardiac catheterization on 12/19/2014 at [facility] which showed critical sequential stenoses in proximal and mid LAD and also multiple sequential stenoses in proximal and  distal RCA. Patient absolutely refused for evaluation for CABG and was transferred to [facility] for high risk PCI. He had Multiple drug elluting stents in RCA and LAD with good result. Post procedure his blood sugar were improving with hydration. Patient requested discharge home today with family member keeping close supervision and he will go back to his old doses of insulin and diabetic medications. He will be followed by his Cardiologist and primary care in 3 days for recheck on blood work and adjustment of his cardiac and diabetic medications.  Consults: cardiology  Significant Diagnostic Studies: labs: Normal WBC and mildly depressed Platelets count. Hgb 11.2 on admission to 9.4 post procedure. Near normal BMET except creatinine of 1.32 from 1.55 and glucose of 277 from 312 mg/dL at 50 % of usual Lantus dose. Patient to resume old Lantus insulin dose at home.  EKG-SR, low voltage, Acute anterior MI and possible recent inferior MI.  Angioplasty of LAD with 3 drug eluting stents and angioplasty of RCA with 4 drug eluting stents by Dr. Charolette Forward with good result.  Treatments: cardiac meds: benazepril (Lotensin), metoprolol, amlodipine, aspirin, high dose Plavix and fenofibrate.  Discharge Exam: Blood pressure 126/60, pulse 51, temperature 98.6 F (37 C), temperature source Oral, resp. rate 20, weight 142.4 kg (313 lb 15 oz), SpO2 100 %. HEENT: [place]/AT, Eyes- PERL, EOMI, Conjunctiva-Pale pink, Sclera-Non-icteric Neck: No JVD, No bruit, Trachea midline. Lungs: Clear, Bilateral. Cardiac: Regular rhythm, normal S1 and S2, no S3. II/VI systolic murmur. Abdomen: Soft, non-tender. Extremities: No edema present. No cyanosis. No clubbing. CNS: AxOx3, Cranial nerves grossly intact, moves all 4 extremities.  Skin: Warm and dry.  Disposition: 01-Home or self-care.     Medication List    TAKE these medications        amLODipine 10 MG tablet  Commonly known as:  NORVASC  Take  10 mg by mouth daily.  aspirin EC 81 MG tablet  Take 81 mg by mouth daily.     aspirin-acetaminophen-caffeine 062-376-28 MG per tablet  Commonly known as:  EXCEDRIN MIGRAINE  Take 2 tablets by mouth every 6 (six) hours as needed for headache.     benazepril 40 MG tablet  Commonly known as:  LOTENSIN  Take 40 mg by mouth 2 (two) times daily.     buPROPion 150 MG 24 hr tablet  Commonly known as:  WELLBUTRIN XL  Take 150 mg by mouth 2 (two) times daily.     clopidogrel 75 MG tablet  Commonly known as:  PLAVIX  Take 75 mg by mouth daily.     DULoxetine 30 MG capsule  Commonly known as:  CYMBALTA  Take 30-60 mg by mouth 2 (two) times daily. 60 mg every morning and 30 mg every evening.     fenofibrate 54 MG tablet  Take 54 mg by mouth every evening.     fexofenadine 180 MG tablet  Commonly known as:  ALLEGRA  Take 180 mg by mouth daily.     fluticasone 50 MCG/ACT nasal spray  Commonly known as:  FLONASE  Place 2 sprays into both nostrils daily.     glipiZIDE-metformin 5-500 MG per tablet  Commonly known as:  METAGLIP  Take 2 tablets by mouth 2 (two) times daily.     hydrochlorothiazide 12.5 MG capsule  Commonly known as:  MICROZIDE  Take 12.5 mg by mouth daily.     LANTUS SOLOSTAR 100 UNIT/ML Solostar Pen  Generic drug:  Insulin Glargine  Inject 41 Units into the skin at bedtime.     levofloxacin 750 MG tablet  Commonly known as:  LEVAQUIN  Take 1 tablet (750 mg total) by mouth daily.     metoprolol tartrate 25 MG tablet  Commonly known as:  LOPRESSOR  Take 0.5 tablets (12.5 mg total) by mouth 2 (two) times daily.     montelukast 10 MG tablet  Commonly known as:  SINGULAIR  Take 10 mg by mouth daily.     nitroGLYCERIN 0.4 MG SL tablet  Commonly known as:  NITROSTAT  Place 1 tablet (0.4 mg total) under the tongue every 5 (five) minutes x 3 doses as needed for chest pain.     NOVOLOG FLEXPEN 100 UNIT/ML FlexPen  Generic drug:  insulin aspart  Inject 0-15  Units into the skin 3 (three) times daily with meals as needed for high blood sugar.     Omega-3 Krill Oil 300 MG Caps  Take 1 capsule by mouth daily.     Oxcarbazepine 300 MG tablet  Commonly known as:  TRILEPTAL  Take 300 mg by mouth 3 (three) times daily. 300 mg every morning and 600 mg every evening.     oxyCODONE 5 MG immediate release tablet  Commonly known as:  Oxy IR/ROXICODONE  Take 5 mg by mouth 3 (three) times daily as needed for moderate pain.     pantoprazole 40 MG tablet  Commonly known as:  PROTONIX  Take 40 mg by mouth daily.     pregabalin 200 MG capsule  Commonly known as:  LYRICA  Take 200 mg by mouth 3 (three) times daily.     tamsulosin 0.4 MG Caps capsule  Commonly known as:  FLOMAX  Take 0.4 mg by mouth daily.     VICTOZA 18 MG/3ML Sopn  Generic drug:  Liraglutide  Inject 0.6 mg into the skin every evening.  Follow-up Information    Follow up with Lavera Guise, MD. Schedule an appointment as soon as possible for a visit in 1 week.   Specialty:  Internal Medicine   Contact information:   [address] [place] [address] [phone]       Follow up with [facility], MD. Schedule an appointment as soon as possible for a visit in 3 days.   Specialty:  Cardiology   Contact information:   [address] [place] [address] [phone]       Signed: Birdie Riddle 12/21/2014, 1:26 PM

## 2014-12-22 ENCOUNTER — Encounter (HOSPITAL_COMMUNITY): Payer: Self-pay | Admitting: Cardiology

## 2014-12-22 LAB — GLUCOSE, CAPILLARY: Glucose-Capillary: 198 mg/dL — ABNORMAL HIGH (ref 65–99)

## 2014-12-22 LAB — HEMOGLOBIN A1C
HEMOGLOBIN A1C: 7.8 % — AB (ref 4.8–5.6)
MEAN PLASMA GLUCOSE: 177 mg/dL

## 2014-12-23 DIAGNOSIS — E1151 Type 2 diabetes mellitus with diabetic peripheral angiopathy without gangrene: Secondary | ICD-10-CM | POA: Diagnosis not present

## 2014-12-23 DIAGNOSIS — I10 Essential (primary) hypertension: Secondary | ICD-10-CM | POA: Diagnosis not present

## 2014-12-23 DIAGNOSIS — I25119 Atherosclerotic heart disease of native coronary artery with unspecified angina pectoris: Secondary | ICD-10-CM | POA: Diagnosis not present

## 2014-12-23 DIAGNOSIS — F411 Generalized anxiety disorder: Secondary | ICD-10-CM | POA: Diagnosis not present

## 2014-12-23 DIAGNOSIS — F3341 Major depressive disorder, recurrent, in partial remission: Secondary | ICD-10-CM | POA: Diagnosis not present

## 2014-12-23 DIAGNOSIS — I679 Cerebrovascular disease, unspecified: Secondary | ICD-10-CM | POA: Diagnosis not present

## 2014-12-23 LAB — CULTURE, BLOOD (ROUTINE X 2): Culture: NO GROWTH

## 2014-12-23 LAB — GLUCOSE, CAPILLARY
Glucose-Capillary: 173 mg/dL — ABNORMAL HIGH (ref 65–99)
Glucose-Capillary: 197 mg/dL — ABNORMAL HIGH (ref 65–99)

## 2014-12-24 LAB — CULTURE, BLOOD (ROUTINE X 2)

## 2014-12-29 DIAGNOSIS — I5032 Chronic diastolic (congestive) heart failure: Secondary | ICD-10-CM | POA: Diagnosis not present

## 2014-12-29 DIAGNOSIS — I2510 Atherosclerotic heart disease of native coronary artery without angina pectoris: Secondary | ICD-10-CM | POA: Insufficient documentation

## 2014-12-29 DIAGNOSIS — I214 Non-ST elevation (NSTEMI) myocardial infarction: Secondary | ICD-10-CM | POA: Diagnosis not present

## 2015-01-20 ENCOUNTER — Encounter: Payer: Commercial Managed Care - HMO | Attending: Internal Medicine | Admitting: *Deleted

## 2015-01-20 VITALS — Ht 68.5 in | Wt 307.4 lb

## 2015-01-20 DIAGNOSIS — Z9861 Coronary angioplasty status: Secondary | ICD-10-CM

## 2015-01-20 DIAGNOSIS — I214 Non-ST elevation (NSTEMI) myocardial infarction: Secondary | ICD-10-CM

## 2015-01-20 DIAGNOSIS — Z9889 Other specified postprocedural states: Secondary | ICD-10-CM | POA: Diagnosis not present

## 2015-01-20 NOTE — Progress Notes (Signed)
Cardiac Individual Treatment Plan  Patient Details  Name: Victor Wise MRN: [id number] Date of Birth: Jan 06, 1951 Referring Provider:  Corey Skains, MD  Initial Encounter Date: Date: 01/20/15  Visit Diagnosis: NSTEMI (non-ST elevated myocardial infarction)  History of percutaneous coronary intervention  Patient's Home Medications on Admission:  Current outpatient prescriptions:  .  amLODipine (NORVASC) 10 MG tablet, Take 10 mg by mouth daily., Disp: , Rfl:  .  aspirin EC 81 MG tablet, Take 81 mg by mouth daily., Disp: , Rfl:  .  benazepril (LOTENSIN) 40 MG tablet, Take 40 mg by mouth 2 (two) times daily. , Disp: , Rfl:  .  buPROPion (WELLBUTRIN XL) 150 MG 24 hr tablet, Take 150 mg by mouth 2 (two) times daily., Disp: , Rfl:  .  cetirizine (ZYRTEC) 10 MG tablet, Take 10 mg by mouth daily., Disp: , Rfl:  .  clopidogrel (PLAVIX) 75 MG tablet, Take 75 mg by mouth daily., Disp: , Rfl:  .  DULoxetine (CYMBALTA) 30 MG capsule, Take 90 mg by mouth daily., Disp: , Rfl:  .  fenofibrate 54 MG tablet, Take 54 mg by mouth every evening., Disp: , Rfl:  .  fexofenadine (ALLEGRA) 180 MG tablet, Take 180 mg by mouth daily., Disp: , Rfl:  .  fluticasone (FLONASE) 50 MCG/ACT nasal spray, Place 2 sprays into both nostrils daily., Disp: , Rfl:  .  glipiZIDE-metformin (METAGLIP) 5-500 MG per tablet, Take 2 tablets by mouth 2 (two) times daily., Disp: , Rfl:  .  hydrochlorothiazide (MICROZIDE) 12.5 MG capsule, Take 12.5 mg by mouth daily., Disp: , Rfl:  .  insulin aspart (NOVOLOG FLEXPEN) 100 UNIT/ML FlexPen, Inject 0-15 Units into the skin 3 (three) times daily with meals as needed for high blood sugar. , Disp: , Rfl:  .  Insulin Glargine (LANTUS SOLOSTAR) 100 UNIT/ML Solostar Pen, Inject 41 Units into the skin at bedtime., Disp: , Rfl:  .  Liraglutide (VICTOZA) 18 MG/3ML SOPN, Inject 0.6 mg into the skin every evening. , Disp: , Rfl:  .  metaxalone (SKELAXIN) 800 MG tablet, Take 800 mg by mouth 3  (three) times daily. Take half tab orally prn, Disp: , Rfl:  .  metoprolol tartrate (LOPRESSOR) 25 MG tablet, Take 0.5 tablets (12.5 mg total) by mouth 2 (two) times daily., Disp: 30 tablet, Rfl: 1 .  mometasone (NASONEX) 50 MCG/ACT nasal spray, Place 2 sprays into the nose daily., Disp: , Rfl:  .  montelukast (SINGULAIR) 10 MG tablet, Take 10 mg by mouth daily., Disp: , Rfl:  .  nitroGLYCERIN (NITROSTAT) 0.4 MG SL tablet, Place 1 tablet (0.4 mg total) under the tongue every 5 (five) minutes x 3 doses as needed for chest pain., Disp: 25 tablet, Rfl: 1 .  Oxcarbazepine (TRILEPTAL) 300 MG tablet, Take 300 mg by mouth 2 (two) times daily. , Disp: , Rfl:  .  oxyCODONE (OXY IR/ROXICODONE) 5 MG immediate release tablet, Take 5 mg by mouth 3 (three) times daily as needed for moderate pain. , Disp: , Rfl:  .  OxyCODONE (OXYCONTIN) 20 mg T12A 12 hr tablet, Take 20 mg by mouth 3 (three) times daily., Disp: , Rfl:  .  pantoprazole (PROTONIX) 40 MG tablet, Take 40 mg by mouth daily., Disp: , Rfl:  .  pregabalin (LYRICA) 200 MG capsule, Take 200 mg by mouth 3 (three) times daily., Disp: , Rfl:  .  tamsulosin (FLOMAX) 0.4 MG CAPS capsule, Take 0.4 mg by mouth daily., Disp: , Rfl:  .  aspirin-acetaminophen-caffeine (EXCEDRIN MIGRAINE) 250-250-65 MG per tablet, Take 2 tablets by mouth every 6 (six) hours as needed for headache., Disp: , Rfl:  .  DULoxetine (CYMBALTA) 30 MG capsule, Take 30-60 mg by mouth 2 (two) times daily. 60 mg every morning and 30 mg every evening., Disp: , Rfl:  .  levofloxacin (LEVAQUIN) 750 MG tablet, Take 1 tablet (750 mg total) by mouth daily. (Patient not taking: Reported on 01/20/2015), Disp: 3 tablet, Rfl: 0 .  Omega-3 Krill Oil 300 MG CAPS, Take 1 capsule by mouth daily., Disp: , Rfl:   Past Medical History: Past Medical History  Diagnosis Date  . Diabetes   . Hypertension   . BPH (benign prostatic hyperplasia)   . Osteomyelitis of foot   . Morbid obesity   . Obstructive sleep  apnea   . Chronic back pain   . Depression   . UTI (lower urinary tract infection)     Tobacco Use: History  Smoking status  . Former Smoker  . Types: Cigarettes  Smokeless tobacco  . Not on file    Labs: Recent Review Flowsheet Data    Labs for ITP Cardiac and Pulmonary Rehab Latest Ref Rng 08/30/2013 12/19/2014 12/20/2014   Cholestrol 0 - 200 mg/dL 180 - 147   LDLCALC 0 - 99 mg/dL SEE COMMENT - UNABLE TO CALCULATE IF TRIGLYCERIDE OVER 400 mg/dL   HDL >40 mg/dL 27(L) - 14(L)   Trlycerides <150 mg/dL 465(H) - 512(H)   Hemoglobin A1c 4.8 - 5.6 % - 7.8(H) -       Exercise Target Goals: Date: 01/20/15  Exercise Program Goal: Individual exercise prescription set with THRR, safety & activity barriers. Participant demonstrates ability to understand and report RPE using BORG scale, to self-measure pulse accurately, and to acknowledge the importance of the exercise prescription.  Exercise Prescription Goal: Starting with aerobic activity 30 plus minutes a day, 3 days per week for initial exercise prescription. Provide home exercise prescription and guidelines that participant acknowledges understanding prior to discharge.  Activity Barriers & Risk Stratification:     Activity Barriers & Risk Stratification - 01/20/15 1754    Activity Barriers & Risk Stratification   Activity Barriers Balance Concerns;History of Falls;Neck/Spine Problems;Back Problems;Deconditioning;Muscular Weakness;Assistive Device;Joint Problems      6 Minute Walk:     6 Minute Walk      01/20/15 1513       6 Minute Walk   Phase Initial     Distance 470 feet     Walk Time 4.5 minutes     Resting HR 75 bpm     Resting BP 100/60 mmHg     Max Ex. HR 113 bpm     Max Ex. BP 132/60 mmHg     RPE 17     Symptoms No        Initial Exercise Prescription:     Initial Exercise Prescription - 01/20/15 1500    Date of Initial Exercise Prescription   Date 01/20/15   Treadmill   MPH 0.8  Use intervals to  complete time goals   Grade 0   Minutes 5   Bike   Level 0.2   Minutes 10   Recumbant Bike   Level 2   RPM 40   Watts 20   Minutes 15   NuStep   Level 2   Watts 30   Minutes 10   Arm Ergometer   Level 1   Watts 8   Minutes 10  Arm/Foot Ergometer   Level 4   Watts 12   Minutes 10   Cybex   Level 1   RPM 50   Minutes 10   Recumbant Elliptical   Level 1   RPM 40   Watts 10   Minutes 10   Elliptical   Level 1   Speed 3   Minutes 1   REL-XR   Level 1   Watts 25   Minutes 10   Prescription Details   Frequency (times per week) 3   Duration Progress to 30 minutes of continuous aerobic without signs/symptoms of physical distress   Intensity   THRR REST +  30   Ratings of Perceived Exertion 11-15   Progression Continue progressive overload as per policy without signs/symptoms or physical distress.   Resistance Training   Training Prescription Yes   Weight 2   Reps 10-15      Exercise Prescription Changes:   Discharge Exercise Prescription (Final Exercise Prescription Changes):   Nutrition:  Target Goals: Understanding of nutrition guidelines, daily intake of sodium 1500mg , cholesterol 200mg , calories 30% from fat and 7% or less from saturated fats, daily to have 5 or more servings of fruits and vegetables.  Biometrics:     Pre Biometrics - 01/20/15 1451    Pre Biometrics   Height 5' 8.5" (1.74 m)   Weight (!) 307 lb 6.4 oz (139.436 kg)   Waist Circumference 58.25 inches   Hip Circumference 59 inches   Waist to Hip Ratio 0.99 %   BMI (Calculated) 46.2       Nutrition Therapy Plan and Nutrition Goals:   Nutrition Discharge: Rate Your Plate Scores:   Nutrition Goals Re-Evaluation:   Psychosocial: Target Goals: Acknowledge presence or absence of depression, maximize coping skills, provide positive support system. Participant is able to verbalize types and ability to use techniques and skills needed for reducing stress and  depression.  Initial Review & Psychosocial Screening:     Initial Psych Review & Screening - 01/20/15 1746    Initial Review   Current issues with Current Depression;History of Depression;Current Psychotropic Meds;Current Sleep Concerns   Family Dynamics   Good Support System? Yes   Comments Patient states he lives with his sister Kerry Dory, "who is opinionated and has red hair."  His sister Jeani Hawking and his daughter [name] are his main supporters.  He would like to get stronger and try to move into senior housing.  Divorced, has one daughter and one grand-daughter.  See PHQ-9 score.     Barriers   Psychosocial barriers to participate in program The patient should benefit from training in stress management and relaxation.  Pt has a PHQ-9 score of 24.     Screening Interventions   Interventions Program counselor consult      Quality of Life Scores:     Quality of Life - 01/20/15 1752    Quality of Life Scores   Health/Function Pre 6.23 %   Socioeconomic Pre 10.5 %   Psych/Spiritual Pre 5.79 %   Family Pre 5.9 %   GLOBAL Pre 6.97 %      PHQ-9:     Recent Review Flowsheet Data    Depression screen [facility] 2/9 01/20/2015   Decreased Interest 3   Down, Depressed, Hopeless 3   PHQ - 2 Score 6   Altered sleeping 3   Tired, decreased energy 3   Change in appetite 3   Feeling bad or failure about yourself  3  Trouble concentrating 3   Moving slowly or fidgety/restless 3   Suicidal thoughts 0   PHQ-9 Score 24   Difficult doing work/chores Extremely dIfficult      Psychosocial Evaluation and Intervention:   Psychosocial Re-Evaluation:   Vocational Rehabilitation: Provide vocational rehab assistance to qualifying candidates.   Vocational Rehab Evaluation & Intervention:     Vocational Rehab - 01/20/15 1737    Initial Vocational Rehab Evaluation & Intervention   Assessment shows need for Vocational Rehabilitation No      Education: Education Goals: Education  classes will be provided on a weekly basis, covering required topics. Participant will state understanding/return demonstration of topics presented.  Learning Barriers/Preferences:     Learning Barriers/Preferences - 01/20/15 1735    Learning Barriers/Preferences   Learning Barriers Hearing   Learning Preferences Written Material      Education Topics: General Nutrition Guidelines/Fats and Fiber: -Group instruction provided by verbal, written material, models and posters to present the general guidelines for heart healthy nutrition. Gives an explanation and review of dietary fats and fiber.   Controlling Sodium/Reading Food Labels: -Group verbal and written material supporting the discussion of sodium use in heart healthy nutrition. Review and explanation with models, verbal and written materials for utilization of the food label.   Exercise Physiology & Risk Factors: - Group verbal and written instruction with models to review the exercise physiology of the cardiovascular system and associated critical values. Details cardiovascular disease risk factors and the goals associated with each risk factor.   Aerobic Exercise & Resistance Training: - Gives group verbal and written discussion on the health impact of inactivity. On the components of aerobic and resistive training programs and the benefits of this training and how to safely progress through these programs.   Flexibility, Balance, General Exercise Guidelines: - Provides group verbal and written instruction on the benefits of flexibility and balance training programs. Provides general exercise guidelines with specific guidelines to those with heart or lung disease. Demonstration and skill practice provided.   Stress Management: - Provides group verbal and written instruction about the health risks of elevated stress, cause of high stress, and healthy ways to reduce stress.   Depression: - Provides group verbal and written  instruction on the correlation between heart/lung disease and depressed mood, treatment options, and the stigmas associated with seeking treatment.   Anatomy & Physiology of the Heart: - Group verbal and written instruction and models provide basic cardiac anatomy and physiology, with the coronary electrical and arterial systems. Review of: AMI, Angina, Valve disease, Heart Failure, Cardiac Arrhythmia, Pacemakers, and the ICD.   Cardiac Procedures: - Group verbal and written instruction and models to describe the testing methods done to diagnose heart disease. Reviews the outcomes of the test results. Describes the treatment choices: Medical Management, Angioplasty, or Coronary Bypass Surgery.   Cardiac Medications: - Group verbal and written instruction to review commonly prescribed medications for heart disease. Reviews the medication, class of the drug, and side effects. Includes the steps to properly store meds and maintain the prescription regimen.   Go Sex-Intimacy & Heart Disease, Get SMART - Goal Setting: - Group verbal and written instruction through game format to discuss heart disease and the return to sexual intimacy. Provides group verbal and written material to discuss and apply goal setting through the application of the S.M.A.R.T. Method.   Other Matters of the Heart: - Provides group verbal, written materials and models to describe Heart Failure, Angina, Valve Disease, and Diabetes in  the realm of heart disease. Includes description of the disease process and treatment options available to the cardiac patient.   Exercise & Equipment Safety: - Individual verbal instruction and demonstration of equipment use and safety with use of the equipment.          Cardiac Rehab from 01/20/2015 in [facility] Cardiac Rehab   Date  01/20/15   Educator  D. Joya Gaskins, RN   Instruction Review Code  1- partially meets, needs review/practice      Infection Prevention: - Provides verbal and  written material to individual with discussion of infection control including proper hand washing and proper equipment cleaning during exercise session.      Cardiac Rehab from 01/20/2015 in [facility] Cardiac Rehab   Date  01/20/15   Educator  D. Joya Gaskins, RN   Instruction Review Code  2- meets goals/outcomes      Falls Prevention: - Provides verbal and written material to individual with discussion of falls prevention and safety.      Cardiac Rehab from 01/20/2015 in [facility] Cardiac Rehab   Date  01/20/15   Educator  D. Joya Gaskins, RN   Instruction Review Code  1- partially meets, needs review/practice      Diabetes: - Individual verbal and written instruction to review signs/symptoms of diabetes, desired ranges of glucose level fasting, after meals and with exercise. Advice that pre and post exercise glucose checks will be done for 3 sessions at entry of program.      Cardiac Rehab from 01/20/2015 in [facility] Cardiac Rehab   Date  01/20/15   Educator  D. Joya Gaskins, RN   Instruction Review Code  1- partially meets, needs review/practice [Needs to meet wtih dietitian about meal planning]       Knowledge Questionnaire Score:     Knowledge Questionnaire Score - 01/20/15 1735    Knowledge Questionnaire Score   Pre Score 25/28      Personal Goals and Risk Factors at Admission:     Personal Goals and Risk Factors at Admission - 01/20/15 1745    Personal Goals and Risk Factors on Admission    Weight Management Yes   Intervention Learn and follow the exercise and diet guidelines while in the program. Utilize the nutrition and education classes to help gain knowledge of the diet and exercise expectations in the program   Admit Weight 307 lb 6.4 oz (139.436 kg)   Increase Aerobic Exercise and Physical Activity Yes   Diabetes Yes   Goal Blood glucose control identified by blood glucose values, HgbA1C. Participant verbalizes understanding of the signs/symptoms of hyper/hypo glycemia, proper foot care and  importance of medication and nutrition plan for blood glucose control.   Intervention Provide nutrition & aerobic exercise along with prescribed medications to achieve blood glucose in normal ranges: Fasting 65-99 mg/dL   Hypertension Yes   Goal Participant will see blood pressure controlled within the values of 140/71mm/Hg or within value directed by their physician.   Intervention Provide nutrition & aerobic exercise along with prescribed medications to achieve BP 140/90 or less.   Lipids Yes   Goal Cholesterol controlled with medications as prescribed, with individualized exercise RX and with personalized nutrition plan. Value goals: LDL < 70mg , HDL > 40mg . Participant states understanding of desired cholesterol values and following prescriptions.   Intervention Provide nutrition & aerobic exercise along with prescribed medications to achieve LDL 70mg , HDL >40mg .   Stress Yes   Goal To meet with psychosocial counselor for stress and relaxation information  and guidance. To state understanding of performing relaxation techniques and or identifying personal stressors.   Intervention Provide education on types of stress, identifiying stressors, and ways to cope with stress. Provide demonstration and active practice of relaxation techniques.      Personal Goals and Risk Factors Review:    Personal Goals Discharge:     Comments: Patient's PHQ-9 Score is 24. Pt currently on two meds for depression. Patient denies suicidal ideation.   Will refer patient to Lucianne Lei, [facility].  Patient to start in the program on Friday, September 9th at 0800.

## 2015-01-20 NOTE — Patient Instructions (Signed)
Patient Instructions  Patient Details  Name: Victor Wise MRN: [id number] Date of Birth: [birth year] Referring Provider:  Corey Skains, MD  Below are the personal goals you chose as well as exercise and nutrition goals. Our goal is to help you keep on track towards obtaining and maintaining your goals. We will be discussing your progress on these goals with you throughout the program.  Initial Exercise Prescription:     Initial Exercise Prescription - 01/20/15 1500    Date of Initial Exercise Prescription   Date 01/20/15   Treadmill   MPH 0.8  Use intervals to complete time goals   Grade 0   Minutes 5   Bike   Level 0.2   Minutes 10   Recumbant Bike   Level 2   RPM 40   Watts 20   Minutes 15   NuStep   Level 2   Watts 30   Minutes 10   Arm Ergometer   Level 1   Watts 8   Minutes 10   Arm/Foot Ergometer   Level 4   Watts 12   Minutes 10   Cybex   Level 1   RPM 50   Minutes 10   Recumbant Elliptical   Level 1   RPM 40   Watts 10   Minutes 10   Elliptical   Level 1   Speed 3   Minutes 1   REL-XR   Level 1   Watts 25   Minutes 10   Prescription Details   Frequency (times per week) 3   Duration Progress to 30 minutes of continuous aerobic without signs/symptoms of physical distress   Intensity   THRR REST +  30   Ratings of Perceived Exertion 11-15   Progression Continue progressive overload as per policy without signs/symptoms or physical distress.   Resistance Training   Training Prescription Yes   Weight 2   Reps 10-15      Exercise Goals: Frequency: Be able to perform aerobic exercise three times per week working toward 3-5 days per week.  Intensity: Work with a perceived exertion of 11 (fairly light) - 15 (hard) as tolerated. Follow your new exercise prescription and watch for changes in prescription as you progress with the program. Changes will be reviewed with you when they are made.  Duration: You should be able to do 30 minutes of  continuous aerobic exercise in addition to a 5 minute warm-up and a 5 minute cool-down routine.  Nutrition Goals: Your personal nutrition goals will be established when you do your nutrition analysis with the dietician.  The following are nutrition guidelines to follow: Cholesterol < 200mg /day Sodium < 1500mg /day Fiber: Men over 50 yrs - 30 grams per day  Personal Goals:     Personal Goals and Risk Factors at Admission - 01/20/15 1745    Personal Goals and Risk Factors on Admission    Weight Management Yes   Intervention Learn and follow the exercise and diet guidelines while in the program. Utilize the nutrition and education classes to help gain knowledge of the diet and exercise expectations in the program   Admit Weight 307 lb 6.4 oz (139.436 kg)   Increase Aerobic Exercise and Physical Activity Yes   Diabetes Yes   Goal Blood glucose control identified by blood glucose values, HgbA1C. Participant verbalizes understanding of the signs/symptoms of hyper/hypo glycemia, proper foot care and importance of medication and nutrition plan for blood glucose control.   Intervention Provide nutrition &  aerobic exercise along with prescribed medications to achieve blood glucose in normal ranges: Fasting 65-99 mg/dL   Hypertension Yes   Goal Participant will see blood pressure controlled within the values of 140/46mm/Hg or within value directed by their physician.   Intervention Provide nutrition & aerobic exercise along with prescribed medications to achieve BP 140/90 or less.   Lipids Yes   Goal Cholesterol controlled with medications as prescribed, with individualized exercise RX and with personalized nutrition plan. Value goals: LDL < 70mg , HDL > 40mg . Participant states understanding of desired cholesterol values and following prescriptions.   Intervention Provide nutrition & aerobic exercise along with prescribed medications to achieve LDL 70mg , HDL >40mg .   Stress Yes   Goal To meet with  psychosocial counselor for stress and relaxation information and guidance. To state understanding of performing relaxation techniques and or identifying personal stressors.   Intervention Provide education on types of stress, identifiying stressors, and ways to cope with stress. Provide demonstration and active practice of relaxation techniques.      Tobacco Use Initial Evaluation: History  Smoking status  . Former Smoker  . Types: Cigarettes  Smokeless tobacco  . Not on file    Copy of goals given to participant.

## 2015-01-23 ENCOUNTER — Encounter: Payer: Commercial Managed Care - HMO | Admitting: *Deleted

## 2015-01-23 DIAGNOSIS — I214 Non-ST elevation (NSTEMI) myocardial infarction: Secondary | ICD-10-CM | POA: Diagnosis not present

## 2015-01-23 DIAGNOSIS — Z9861 Coronary angioplasty status: Secondary | ICD-10-CM

## 2015-01-23 DIAGNOSIS — Z9889 Other specified postprocedural states: Secondary | ICD-10-CM | POA: Diagnosis not present

## 2015-01-23 LAB — GLUCOSE, CAPILLARY
GLUCOSE-CAPILLARY: 201 mg/dL — AB (ref 65–99)
Glucose-Capillary: 166 mg/dL — ABNORMAL HIGH (ref 65–99)

## 2015-01-23 NOTE — Progress Notes (Signed)
Daily Session Note  Patient Details  Name: Victor Wise MRN: [id number] Date of Birth: Jul 03, 1950 Referring Provider:  Corey Skains, MD  Encounter Date: 01/23/2015  Check In:     Session Check In - 01/23/15 1636    Check-In   Staff Present Heath Lark RN, BSN, CCRP;Stacey Blanch Media RRT, RCP Respiratory Therapist;Renee Dillard [place] MS, ACSM CEP Exercise Physiologist   ER physicians immediately available to respond to emergencies See telemetry face sheet for immediately available ER MD   Medication changes reported     No   Fall or balance concerns reported    No   Warm-up and Cool-down Performed on first and last piece of equipment   VAD Patient? No   Pain Assessment   Currently in Pain? No/denies         Goals Met:  Exercise tolerated well Personal goals reviewed No report of cardiac concerns or symptoms Strength training completed today  Goals Unmet:  Not Applicable  Goals Comments: First session for [name].  He was given and reviewed the patient instructions sheet. He verbalized understanding about the goals and exercise prescription on the sheet.   Med change documented  Busprione 5 mg  Twice a day.   Dr. Emily Filbert is Medical Director for [place] and [facility].

## 2015-01-26 ENCOUNTER — Encounter: Payer: Commercial Managed Care - HMO | Admitting: *Deleted

## 2015-01-26 DIAGNOSIS — I214 Non-ST elevation (NSTEMI) myocardial infarction: Secondary | ICD-10-CM | POA: Diagnosis not present

## 2015-01-26 DIAGNOSIS — Z9889 Other specified postprocedural states: Secondary | ICD-10-CM | POA: Diagnosis not present

## 2015-01-26 LAB — GLUCOSE, CAPILLARY
GLUCOSE-CAPILLARY: 126 mg/dL — AB (ref 65–99)
GLUCOSE-CAPILLARY: 161 mg/dL — AB (ref 65–99)

## 2015-01-26 NOTE — Progress Notes (Signed)
Daily Session Note  Patient Details  Name: Victor Wise MRN: [id number] Date of Birth: 02-02-1951 Referring Provider:  Corey Skains, MD  Encounter Date: 01/26/2015  Check In:     Session Check In - 01/26/15 1014    Check-In   Staff Present Candiss Norse MS, ACSM CEP Exercise Physiologist;Susanne Bice RN, BSN, CCRP;Ezekiel Menzer Amedeo Plenty BS, ACSM CEP Exercise Physiologist   ER physicians immediately available to respond to emergencies See telemetry face sheet for immediately available ER MD   Medication changes reported     Yes   Comments lidocaine patch started 01/23/15   Fall or balance concerns reported    No   Warm-up and Cool-down Performed on first and last piece of equipment   VAD Patient? No   Pain Assessment   Currently in Pain? No/denies   Multiple Pain Sites No         Goals Met:  Independence with exercise equipment Exercise tolerated well No report of cardiac concerns or symptoms Strength training completed today  Goals Unmet:  Not Applicable  Goals Comments:    Dr. Emily Filbert is Medical Director for [place] and [facility].

## 2015-01-27 ENCOUNTER — Encounter: Payer: Self-pay | Admitting: *Deleted

## 2015-01-27 NOTE — Progress Notes (Signed)
Cardiac Individual Treatment Plan  Patient Details  Name: Victor Wise MRN: [id number] Date of Birth: December 23, 1950 Referring Provider:  No ref. provider found  Initial Encounter Date:    Visit Diagnosis: No diagnosis found.  Patient's Home Medications on Admission:  Current outpatient prescriptions:  .  amLODipine (NORVASC) 10 MG tablet, Take 10 mg by mouth daily., Disp: , Rfl:  .  aspirin EC 81 MG tablet, Take 81 mg by mouth daily., Disp: , Rfl:  .  aspirin-acetaminophen-caffeine (EXCEDRIN MIGRAINE) 250-250-65 MG per tablet, Take 2 tablets by mouth every 6 (six) hours as needed for headache., Disp: , Rfl:  .  benazepril (LOTENSIN) 40 MG tablet, Take 40 mg by mouth 2 (two) times daily. , Disp: , Rfl:  .  buPROPion (WELLBUTRIN XL) 150 MG 24 hr tablet, Take 150 mg by mouth 2 (two) times daily., Disp: , Rfl:  .  busPIRone (BUSPAR) 5 MG tablet, Take by mouth., Disp: , Rfl:  .  cetirizine (ZYRTEC) 10 MG tablet, Take 10 mg by mouth daily., Disp: , Rfl:  .  clopidogrel (PLAVIX) 75 MG tablet, Take 75 mg by mouth daily., Disp: , Rfl:  .  DULoxetine (CYMBALTA) 30 MG capsule, Take 30-60 mg by mouth 2 (two) times daily. 60 mg every morning and 30 mg every evening., Disp: , Rfl:  .  DULoxetine (CYMBALTA) 30 MG capsule, Take 90 mg by mouth daily., Disp: , Rfl:  .  fenofibrate 54 MG tablet, Take 54 mg by mouth every evening., Disp: , Rfl:  .  fexofenadine (ALLEGRA) 180 MG tablet, Take 180 mg by mouth daily., Disp: , Rfl:  .  fluticasone (FLONASE) 50 MCG/ACT nasal spray, Place 2 sprays into both nostrils daily., Disp: , Rfl:  .  glipiZIDE-metformin (METAGLIP) 5-500 MG per tablet, Take 2 tablets by mouth 2 (two) times daily., Disp: , Rfl:  .  hydrochlorothiazide (MICROZIDE) 12.5 MG capsule, Take 12.5 mg by mouth daily., Disp: , Rfl:  .  insulin aspart (NOVOLOG FLEXPEN) 100 UNIT/ML FlexPen, Inject 0-15 Units into the skin 3 (three) times daily with meals as needed for high blood sugar. , Disp: , Rfl:  .   Insulin Glargine (LANTUS SOLOSTAR) 100 UNIT/ML Solostar Pen, Inject 41 Units into the skin at bedtime., Disp: , Rfl:  .  levofloxacin (LEVAQUIN) 750 MG tablet, Take 1 tablet (750 mg total) by mouth daily. (Patient not taking: Reported on 01/20/2015), Disp: 3 tablet, Rfl: 0 .  Lido-Capsaicin-Men-Methyl Sal (MEDI-PATCH-LIDOCAINE EX), Apply topically., Disp: , Rfl:  .  Liraglutide (VICTOZA) 18 MG/3ML SOPN, Inject 0.6 mg into the skin every evening. , Disp: , Rfl:  .  metaxalone (SKELAXIN) 800 MG tablet, Take 800 mg by mouth 3 (three) times daily. Take half tab orally prn, Disp: , Rfl:  .  metoprolol tartrate (LOPRESSOR) 25 MG tablet, Take 0.5 tablets (12.5 mg total) by mouth 2 (two) times daily., Disp: 30 tablet, Rfl: 1 .  mometasone (NASONEX) 50 MCG/ACT nasal spray, Place 2 sprays into the nose daily., Disp: , Rfl:  .  montelukast (SINGULAIR) 10 MG tablet, Take 10 mg by mouth daily., Disp: , Rfl:  .  nitroGLYCERIN (NITROSTAT) 0.4 MG SL tablet, Place 1 tablet (0.4 mg total) under the tongue every 5 (five) minutes x 3 doses as needed for chest pain., Disp: 25 tablet, Rfl: 1 .  Omega-3 Krill Oil 300 MG CAPS, Take 1 capsule by mouth daily., Disp: , Rfl:  .  Oxcarbazepine (TRILEPTAL) 300 MG tablet, Take 300 mg by  mouth 2 (two) times daily. , Disp: , Rfl:  .  oxyCODONE (OXY IR/ROXICODONE) 5 MG immediate release tablet, Take 5 mg by mouth 3 (three) times daily as needed for moderate pain. , Disp: , Rfl:  .  OxyCODONE (OXYCONTIN) 20 mg T12A 12 hr tablet, Take 20 mg by mouth 3 (three) times daily., Disp: , Rfl:  .  pantoprazole (PROTONIX) 40 MG tablet, Take 40 mg by mouth daily., Disp: , Rfl:  .  pregabalin (LYRICA) 200 MG capsule, Take 200 mg by mouth 3 (three) times daily., Disp: , Rfl:  .  tamsulosin (FLOMAX) 0.4 MG CAPS capsule, Take 0.4 mg by mouth daily., Disp: , Rfl:   Past Medical History: Past Medical History  Diagnosis Date  . Diabetes   . Hypertension   . BPH (benign prostatic hyperplasia)   .  Osteomyelitis of foot   . Morbid obesity   . Obstructive sleep apnea   . Chronic back pain   . Depression   . UTI (lower urinary tract infection)     Tobacco Use: History  Smoking status  . Former Smoker  . Types: Cigarettes  Smokeless tobacco  . Not on file    Labs: Recent Review Flowsheet Data    Labs for ITP Cardiac and Pulmonary Rehab Latest Ref Rng 08/30/2013 12/19/2014 12/20/2014   Cholestrol 0 - 200 mg/dL 180 - 147   LDLCALC 0 - 99 mg/dL SEE COMMENT - UNABLE TO CALCULATE IF TRIGLYCERIDE OVER 400 mg/dL   HDL >40 mg/dL 27(L) - 14(L)   Trlycerides <150 mg/dL 465(H) - 512(H)   Hemoglobin A1c 4.8 - 5.6 % - 7.8(H) -       Exercise Target Goals:    Exercise Program Goal: Individual exercise prescription set with THRR, safety & activity barriers. Participant demonstrates ability to understand and report RPE using BORG scale, to self-measure pulse accurately, and to acknowledge the importance of the exercise prescription.  Exercise Prescription Goal: Starting with aerobic activity 30 plus minutes a day, 3 days per week for initial exercise prescription. Provide home exercise prescription and guidelines that participant acknowledges understanding prior to discharge.  Activity Barriers & Risk Stratification:     Activity Barriers & Risk Stratification - 01/20/15 1754    Activity Barriers & Risk Stratification   Activity Barriers Balance Concerns;History of Falls;Neck/Spine Problems;Back Problems;Deconditioning;Muscular Weakness;Assistive Device;Joint Problems      6 Minute Walk:     6 Minute Walk      01/20/15 1513       6 Minute Walk   Phase Initial     Distance 470 feet     Walk Time 4.5 minutes     Resting HR 75 bpm     Resting BP 100/60 mmHg     Max Ex. HR 113 bpm     Max Ex. BP 132/60 mmHg     RPE 17     Symptoms No        Initial Exercise Prescription:     Initial Exercise Prescription - 01/20/15 1500    Date of Initial Exercise Prescription   Date  01/20/15   Treadmill   MPH 0.8  Use intervals to complete time goals   Grade 0   Minutes 5   Bike   Level 0.2   Minutes 10   Recumbant Bike   Level 2   RPM 40   Watts 20   Minutes 15   NuStep   Level 2   Watts 30   Minutes  10   Arm Ergometer   Level 1   Watts 8   Minutes 10   Arm/Foot Ergometer   Level 4   Watts 12   Minutes 10   Cybex   Level 1   RPM 50   Minutes 10   Recumbant Elliptical   Level 1   RPM 40   Watts 10   Minutes 10   Elliptical   Level 1   Speed 3   Minutes 1   REL-XR   Level 1   Watts 25   Minutes 10   Prescription Details   Frequency (times per week) 3   Duration Progress to 30 minutes of continuous aerobic without signs/symptoms of physical distress   Intensity   THRR REST +  30   Ratings of Perceived Exertion 11-15   Progression Continue progressive overload as per policy without signs/symptoms or physical distress.   Resistance Training   Training Prescription Yes   Weight 2   Reps 10-15      Exercise Prescription Changes:     Exercise Prescription Changes      01/27/15 0600           Exercise Review   Progression Yes       Response to Exercise   Blood Pressure (Admit) 148/82 mmHg       Blood Pressure (Exercise) 146/82 mmHg       Blood Pressure (Exit) 136/84 mmHg       Heart Rate (Admit) 67 bpm       Heart Rate (Exercise) 67 bpm       Heart Rate (Exit) 65 bpm       Rating of Perceived Exertion (Exercise) 11       Symptoms No       Duration Progress to 30 minutes of continuous aerobic without signs/symptoms of physical distress       Intensity Rest + 30       Progression Continue progressive overload as per policy without signs/symptoms or physical distress.       Resistance Training   Training Prescription Yes       Weight 2       Reps 10-15       Interval Training   Interval Training No       NuStep   Level 2       Watts 15       Minutes 20          Discharge Exercise Prescription (Final Exercise  Prescription Changes):     Exercise Prescription Changes - 01/27/15 0600    Exercise Review   Progression Yes   Response to Exercise   Blood Pressure (Admit) 148/82 mmHg   Blood Pressure (Exercise) 146/82 mmHg   Blood Pressure (Exit) 136/84 mmHg   Heart Rate (Admit) 67 bpm   Heart Rate (Exercise) 67 bpm   Heart Rate (Exit) 65 bpm   Rating of Perceived Exertion (Exercise) 11   Symptoms No   Duration Progress to 30 minutes of continuous aerobic without signs/symptoms of physical distress   Intensity Rest + 30   Progression Continue progressive overload as per policy without signs/symptoms or physical distress.   Resistance Training   Training Prescription Yes   Weight 2   Reps 10-15   Interval Training   Interval Training No   NuStep   Level 2   Watts 15   Minutes 20      Nutrition:  Target Goals: Understanding of nutrition  guidelines, daily intake of sodium <1513m, cholesterol <2059m calories 30% from fat and 7% or less from saturated fats, daily to have 5 or more servings of fruits and vegetables.  Biometrics:     Pre Biometrics - 01/20/15 1451    Pre Biometrics   Height 5' 8.5" (1.74 m)   Weight (!) 307 lb 6.4 oz (139.436 kg)   Waist Circumference 58.25 inches   Hip Circumference 59 inches   Waist to Hip Ratio 0.99 %   BMI (Calculated) 46.2       Nutrition Therapy Plan and Nutrition Goals:   Nutrition Discharge: Rate Your Plate Scores:   Nutrition Goals Re-Evaluation:   Psychosocial: Target Goals: Acknowledge presence or absence of depression, maximize coping skills, provide positive support system. Participant is able to verbalize types and ability to use techniques and skills needed for reducing stress and depression.  Initial Review & Psychosocial Screening:     Initial Psych Review & Screening - 01/20/15 1746    Initial Review   Current issues with Current Depression;History of Depression;Current Psychotropic Meds;Current Sleep Concerns    Family Dynamics   Good Support System? Yes   Comments Patient states he lives with his sister LyKerry Dory"who is opinionated and has red hair."  His sister LyJeani Hawkingnd his daughter JeAnderson Maltare his main supporters.  He would like to get stronger and try to move into senior housing.  Divorced, has one daughter and one grand-daughter.  See PHQ-9 score.     Barriers   Psychosocial barriers to participate in program The patient should benefit from training in stress management and relaxation.  Pt has a PHQ-9 score of 24.     Screening Interventions   Interventions Program counselor consult      Quality of Life Scores:     Quality of Life - 01/20/15 1752    Quality of Life Scores   Health/Function Pre 6.23 %   Socioeconomic Pre 10.5 %   Psych/Spiritual Pre 5.79 %   Family Pre 5.9 %   GLOBAL Pre 6.97 %      PHQ-9:     Recent Review Flowsheet Data    [facility] 01/20/2015   Decreased Interest 3   Down, Depressed, Hopeless 3   PHQ - 2 Score 6   Altered sleeping 3   Tired, decreased energy 3   Change in appetite 3   Feeling bad or failure about yourself  3   Trouble concentrating 3   Moving slowly or fidgety/restless 3   Suicidal thoughts 0   PHQ-9 Score 24   Difficult doing work/chores Extremely dIfficult      Psychosocial Evaluation and Intervention:     Psychosocial Evaluation - 01/26/15 1017    Psychosocial Evaluation & Interventions   Interventions Stress management education;Relaxation education;Encouraged to exercise with the program and follow exercise prescription   Comments Counselor met with Mr. WoPolancooday for initial psychosocial evaluation.  He is a [age]ear old who reports having multiple health issues, including more recently a heart attack in August.  He has a limited support system as he lives with his sister and brother-in-law and has a daughter close by.  Mr. WoBishopeports sleeping "odd hours" with help of a CPAP.  He admits to a history of  "clinical depression" and is currently on medications for this and for anxiety.  His PHQ-9 scores were significantly high so counselor questioned efficacy of his medications, with Mr. W Viona Gilmoretating they are not very  effective.  He admits to multiple stressors, including his health, his living arrangements with his sister and brother-in-law and finances.  His mood was depressed and he engaged in a great deal of negative self-talk, denying suicidal ideations currently.  Therapist recommended seeing a therapist and Mr. Viona Gilmore states he "cannot afford the copay."  Counselor will research local programs to meet this need and educated Mr. Viona Gilmore on the benefit of medications and psychotherapy for clinical depression.  Counselor will follow up regularly with Mr. Viona Gilmore.   Continued Psychosocial Services Needed Yes  Mr. W will benefit from psychotherapy for his clinical depression.  He also will benefit from the psychoeducational components of this program, and meeting with the dietician to address weight loss goals.       Psychosocial Re-Evaluation:   Vocational Rehabilitation: Provide vocational rehab assistance to qualifying candidates.   Vocational Rehab Evaluation & Intervention:     Vocational Rehab - 01/20/15 1737    Initial Vocational Rehab Evaluation & Intervention   Assessment shows need for Vocational Rehabilitation No      Education: Education Goals: Education classes will be provided on a weekly basis, covering required topics. Participant will state understanding/return demonstration of topics presented.  Learning Barriers/Preferences:     Learning Barriers/Preferences - 01/20/15 1735    Learning Barriers/Preferences   Learning Barriers Hearing   Learning Preferences Written Material      Education Topics: General Nutrition Guidelines/Fats and Fiber: -Group instruction provided by verbal, written material, models and posters to present the general guidelines for heart healthy nutrition. Gives  an explanation and review of dietary fats and fiber.   Controlling Sodium/Reading Food Labels: -Group verbal and written material supporting the discussion of sodium use in heart healthy nutrition. Review and explanation with models, verbal and written materials for utilization of the food label.   Exercise Physiology & Risk Factors: - Group verbal and written instruction with models to review the exercise physiology of the cardiovascular system and associated critical values. Details cardiovascular disease risk factors and the goals associated with each risk factor.   Aerobic Exercise & Resistance Training: - Gives group verbal and written discussion on the health impact of inactivity. On the components of aerobic and resistive training programs and the benefits of this training and how to safely progress through these programs.   Flexibility, Balance, General Exercise Guidelines: - Provides group verbal and written instruction on the benefits of flexibility and balance training programs. Provides general exercise guidelines with specific guidelines to those with heart or lung disease. Demonstration and skill practice provided.   Stress Management: - Provides group verbal and written instruction about the health risks of elevated stress, cause of high stress, and healthy ways to reduce stress.   Depression: - Provides group verbal and written instruction on the correlation between heart/lung disease and depressed mood, treatment options, and the stigmas associated with seeking treatment.   Anatomy & Physiology of the Heart: - Group verbal and written instruction and models provide basic cardiac anatomy and physiology, with the coronary electrical and arterial systems. Review of: AMI, Angina, Valve disease, Heart Failure, Cardiac Arrhythmia, Pacemakers, and the ICD.   Cardiac Procedures: - Group verbal and written instruction and models to describe the testing methods done to diagnose  heart disease. Reviews the outcomes of the test results. Describes the treatment choices: Medical Management, Angioplasty, or Coronary Bypass Surgery.   Cardiac Medications: - Group verbal and written instruction to review commonly prescribed medications for heart disease. Reviews the  medication, class of the drug, and side effects. Includes the steps to properly store meds and maintain the prescription regimen.   Go Sex-Intimacy & Heart Disease, Get SMART - Goal Setting: - Group verbal and written instruction through game format to discuss heart disease and the return to sexual intimacy. Provides group verbal and written material to discuss and apply goal setting through the application of the S.M.A.R.T. Method.   Other Matters of the Heart: - Provides group verbal, written materials and models to describe Heart Failure, Angina, Valve Disease, and Diabetes in the realm of heart disease. Includes description of the disease process and treatment options available to the cardiac patient.   Exercise & Equipment Safety: - Individual verbal instruction and demonstration of equipment use and safety with use of the equipment.          Cardiac Rehab from 01/20/2015 in [facility] Cardiac Rehab   Date  01/20/15   Educator  D. Joya Gaskins, RN   Instruction Review Code  1- partially meets, needs review/practice      Infection Prevention: - Provides verbal and written material to individual with discussion of infection control including proper hand washing and proper equipment cleaning during exercise session.      Cardiac Rehab from 01/20/2015 in [facility] Cardiac Rehab   Date  01/20/15   Educator  D. Joya Gaskins, RN   Instruction Review Code  2- meets goals/outcomes      Falls Prevention: - Provides verbal and written material to individual with discussion of falls prevention and safety.      Cardiac Rehab from 01/20/2015 in [facility] Cardiac Rehab   Date  01/20/15   Educator  D. Joya Gaskins, RN   Instruction Review Code   1- partially meets, needs review/practice      Diabetes: - Individual verbal and written instruction to review signs/symptoms of diabetes, desired ranges of glucose level fasting, after meals and with exercise. Advice that pre and post exercise glucose checks will be done for 3 sessions at entry of program.      Cardiac Rehab from 01/20/2015 in [facility] Cardiac Rehab   Date  01/20/15   Educator  D. Joya Gaskins, RN   Instruction Review Code  1- partially meets, needs review/practice [Needs to meet wtih dietitian about meal planning]       Knowledge Questionnaire Score:     Knowledge Questionnaire Score - 01/20/15 1735    Knowledge Questionnaire Score   Pre Score 25/28      Personal Goals and Risk Factors at Admission:     Personal Goals and Risk Factors at Admission - 01/20/15 1745    Personal Goals and Risk Factors on Admission    Weight Management Yes   Intervention Learn and follow the exercise and diet guidelines while in the program. Utilize the nutrition and education classes to help gain knowledge of the diet and exercise expectations in the program   Admit Weight 307 lb 6.4 oz (139.436 kg)   Increase Aerobic Exercise and Physical Activity Yes   Diabetes Yes   Goal Blood glucose control identified by blood glucose values, HgbA1C. Participant verbalizes understanding of the signs/symptoms of hyper/hypo glycemia, proper foot care and importance of medication and nutrition plan for blood glucose control.   Intervention Provide nutrition & aerobic exercise along with prescribed medications to achieve blood glucose in normal ranges: Fasting 65-99 mg/dL   Hypertension Yes   Goal Participant will see blood pressure controlled within the values of 140/91m/Hg or within value directed by their  physician.   Intervention Provide nutrition & aerobic exercise along with prescribed medications to achieve BP 140/90 or less.   Lipids Yes   Goal Cholesterol controlled with medications as prescribed,  with individualized exercise RX and with personalized nutrition plan. Value goals: LDL < 76m, HDL > 438m Participant states understanding of desired cholesterol values and following prescriptions.   Intervention Provide nutrition & aerobic exercise along with prescribed medications to achieve LDL <7077mHDL >73m31m Stress Yes   Goal To meet with psychosocial counselor for stress and relaxation information and guidance. To state understanding of performing relaxation techniques and or identifying personal stressors.   Intervention Provide education on types of stress, identifiying stressors, and ways to cope with stress. Provide demonstration and active practice of relaxation techniques.      Personal Goals and Risk Factors Review:    Personal Goals Discharge:     Comments: 30 day review

## 2015-01-28 ENCOUNTER — Encounter: Payer: Self-pay | Admitting: *Deleted

## 2015-01-28 DIAGNOSIS — E1151 Type 2 diabetes mellitus with diabetic peripheral angiopathy without gangrene: Secondary | ICD-10-CM | POA: Diagnosis not present

## 2015-01-28 DIAGNOSIS — Z9861 Coronary angioplasty status: Secondary | ICD-10-CM

## 2015-01-28 DIAGNOSIS — I214 Non-ST elevation (NSTEMI) myocardial infarction: Secondary | ICD-10-CM

## 2015-01-28 DIAGNOSIS — I10 Essential (primary) hypertension: Secondary | ICD-10-CM | POA: Diagnosis not present

## 2015-01-28 DIAGNOSIS — F3341 Major depressive disorder, recurrent, in partial remission: Secondary | ICD-10-CM | POA: Diagnosis not present

## 2015-01-28 DIAGNOSIS — I25119 Atherosclerotic heart disease of native coronary artery with unspecified angina pectoris: Secondary | ICD-10-CM | POA: Diagnosis not present

## 2015-01-28 DIAGNOSIS — Z9889 Other specified postprocedural states: Secondary | ICD-10-CM | POA: Diagnosis not present

## 2015-01-28 DIAGNOSIS — I679 Cerebrovascular disease, unspecified: Secondary | ICD-10-CM | POA: Diagnosis not present

## 2015-01-28 LAB — GLUCOSE, CAPILLARY
GLUCOSE-CAPILLARY: 181 mg/dL — AB (ref 65–99)
Glucose-Capillary: 209 mg/dL — ABNORMAL HIGH (ref 65–99)

## 2015-01-28 NOTE — Progress Notes (Signed)
Daily Session Note  Patient Details  Name: MAUI BRITTEN MRN: [id number] Date of Birth: May 02, 1951 Referring Provider:  Corey Skains, MD  Encounter Date: 01/28/2015  Check In:     Session Check In - 01/28/15 0907    Check-In   Staff Present Lestine Box BS, ACSM EP-C, Exercise Physiologist;Susanne Bice RN, BSN, CCRP;Renee Dillard Essex MS, ACSM CEP Exercise Physiologist   ER physicians immediately available to respond to emergencies See telemetry face sheet for immediately available ER MD   Medication changes reported     No   Fall or balance concerns reported    No   Warm-up and Cool-down Performed on first and last piece of equipment   VAD Patient? No   Pain Assessment   Currently in Pain? No/denies         Goals Met:  Proper associated with RPD/PD & O2 Sat Exercise tolerated well No report of cardiac concerns or symptoms Strength training completed today  Goals Unmet:  Not Applicable  Goals Comments:    Dr. Emily Filbert is Medical Director for [facility].

## 2015-01-28 NOTE — Progress Notes (Signed)
Cardiac Individual Treatment Plan  Patient Details  Name: Victor Wise MRN: [id number] Date of Birth: [birth year] Referring Provider:  Corey Skains, MD  Initial Encounter Date:    Visit Diagnosis: NSTEMI (non-ST elevated myocardial infarction)  History of percutaneous coronary intervention  Patient's Home Medications on Admission:  Current outpatient prescriptions:  .  amLODipine (NORVASC) 10 MG tablet, Take 10 mg by mouth daily., Disp: , Rfl:  .  aspirin EC 81 MG tablet, Take 81 mg by mouth daily., Disp: , Rfl:  .  aspirin-acetaminophen-caffeine (EXCEDRIN MIGRAINE) 250-250-65 MG per tablet, Take 2 tablets by mouth every 6 (six) hours as needed for headache., Disp: , Rfl:  .  benazepril (LOTENSIN) 40 MG tablet, Take 40 mg by mouth 2 (two) times daily. , Disp: , Rfl:  .  buPROPion (WELLBUTRIN XL) 150 MG 24 hr tablet, Take 150 mg by mouth 2 (two) times daily., Disp: , Rfl:  .  busPIRone (BUSPAR) 5 MG tablet, Take by mouth., Disp: , Rfl:  .  cetirizine (ZYRTEC) 10 MG tablet, Take 10 mg by mouth daily., Disp: , Rfl:  .  clopidogrel (PLAVIX) 75 MG tablet, Take 75 mg by mouth daily., Disp: , Rfl:  .  DULoxetine (CYMBALTA) 30 MG capsule, Take 30-60 mg by mouth 2 (two) times daily. 60 mg every morning and 30 mg every evening., Disp: , Rfl:  .  DULoxetine (CYMBALTA) 30 MG capsule, Take 90 mg by mouth daily., Disp: , Rfl:  .  fenofibrate 54 MG tablet, Take 54 mg by mouth every evening., Disp: , Rfl:  .  fexofenadine (ALLEGRA) 180 MG tablet, Take 180 mg by mouth daily., Disp: , Rfl:  .  fluticasone (FLONASE) 50 MCG/ACT nasal spray, Place 2 sprays into both nostrils daily., Disp: , Rfl:  .  glipiZIDE-metformin (METAGLIP) 5-500 MG per tablet, Take 2 tablets by mouth 2 (two) times daily., Disp: , Rfl:  .  hydrochlorothiazide (MICROZIDE) 12.5 MG capsule, Take 12.5 mg by mouth daily., Disp: , Rfl:  .  insulin aspart (NOVOLOG FLEXPEN) 100 UNIT/ML FlexPen, Inject 0-15 Units into the skin 3 (three)  times daily with meals as needed for high blood sugar. , Disp: , Rfl:  .  Insulin Glargine (LANTUS SOLOSTAR) 100 UNIT/ML Solostar Pen, Inject 41 Units into the skin at bedtime., Disp: , Rfl:  .  levofloxacin (LEVAQUIN) 750 MG tablet, Take 1 tablet (750 mg total) by mouth daily. (Patient not taking: Reported on 01/20/2015), Disp: 3 tablet, Rfl: 0 .  Lido-Capsaicin-Men-Methyl Sal (MEDI-PATCH-LIDOCAINE EX), Apply topically., Disp: , Rfl:  .  Liraglutide (VICTOZA) 18 MG/3ML SOPN, Inject 0.6 mg into the skin every evening. , Disp: , Rfl:  .  metaxalone (SKELAXIN) 800 MG tablet, Take 800 mg by mouth 3 (three) times daily. Take half tab orally prn, Disp: , Rfl:  .  metoprolol tartrate (LOPRESSOR) 25 MG tablet, Take 0.5 tablets (12.5 mg total) by mouth 2 (two) times daily., Disp: 30 tablet, Rfl: 1 .  mometasone (NASONEX) 50 MCG/ACT nasal spray, Place 2 sprays into the nose daily., Disp: , Rfl:  .  montelukast (SINGULAIR) 10 MG tablet, Take 10 mg by mouth daily., Disp: , Rfl:  .  nitroGLYCERIN (NITROSTAT) 0.4 MG SL tablet, Place 1 tablet (0.4 mg total) under the tongue every 5 (five) minutes x 3 doses as needed for chest pain., Disp: 25 tablet, Rfl: 1 .  Omega-3 Krill Oil 300 MG CAPS, Take 1 capsule by mouth daily., Disp: , Rfl:  .  Oxcarbazepine (  TRILEPTAL) 300 MG tablet, Take 300 mg by mouth 2 (two) times daily. , Disp: , Rfl:  .  oxyCODONE (OXY IR/ROXICODONE) 5 MG immediate release tablet, Take 5 mg by mouth 3 (three) times daily as needed for moderate pain. , Disp: , Rfl:  .  OxyCODONE (OXYCONTIN) 20 mg T12A 12 hr tablet, Take 20 mg by mouth 3 (three) times daily., Disp: , Rfl:  .  pantoprazole (PROTONIX) 40 MG tablet, Take 40 mg by mouth daily., Disp: , Rfl:  .  pregabalin (LYRICA) 200 MG capsule, Take 200 mg by mouth 3 (three) times daily., Disp: , Rfl:  .  tamsulosin (FLOMAX) 0.4 MG CAPS capsule, Take 0.4 mg by mouth daily., Disp: , Rfl:   Past Medical History: Past Medical History  Diagnosis Date   . Diabetes   . Hypertension   . BPH (benign prostatic hyperplasia)   . Osteomyelitis of foot   . Morbid obesity   . Obstructive sleep apnea   . Chronic back pain   . Depression   . UTI (lower urinary tract infection)     Tobacco Use: History  Smoking status  . Former Smoker  . Types: Cigarettes  Smokeless tobacco  . Not on file    Labs: Recent Review Flowsheet Data    Labs for ITP Cardiac and Pulmonary Rehab Latest Ref Rng 08/30/2013 12/19/2014 12/20/2014   Cholestrol 0 - 200 mg/dL 180 - 147   LDLCALC 0 - 99 mg/dL SEE COMMENT - UNABLE TO CALCULATE IF TRIGLYCERIDE OVER 400 mg/dL   HDL >40 mg/dL 27(L) - 14(L)   Trlycerides <150 mg/dL 465(H) - 512(H)   Hemoglobin A1c 4.8 - 5.6 % - 7.8(H) -       Exercise Target Goals:    Exercise Program Goal: Individual exercise prescription set with THRR, safety & activity barriers. Participant demonstrates ability to understand and report RPE using BORG scale, to self-measure pulse accurately, and to acknowledge the importance of the exercise prescription.  Exercise Prescription Goal: Starting with aerobic activity 30 plus minutes a day, 3 days per week for initial exercise prescription. Provide home exercise prescription and guidelines that participant acknowledges understanding prior to discharge.  Activity Barriers & Risk Stratification:     Activity Barriers & Risk Stratification - 01/20/15 1754    Activity Barriers & Risk Stratification   Activity Barriers Balance Concerns;History of Falls;Neck/Spine Problems;Back Problems;Deconditioning;Muscular Weakness;Assistive Device;Joint Problems      6 Minute Walk:     6 Minute Walk      01/20/15 1513       6 Minute Walk   Phase Initial     Distance 470 feet     Walk Time 4.5 minutes     Resting HR 75 bpm     Resting BP 100/60 mmHg     Max Ex. HR 113 bpm     Max Ex. BP 132/60 mmHg     RPE 17     Symptoms No        Initial Exercise Prescription:     Initial Exercise  Prescription - 01/20/15 1500    Date of Initial Exercise Prescription   Date 01/20/15   Treadmill   MPH 0.8  Use intervals to complete time goals   Grade 0   Minutes 5   Bike   Level 0.2   Minutes 10   Recumbant Bike   Level 2   RPM 40   Watts 20   Minutes 15   NuStep   Level  2   Watts 30   Minutes 10   Arm Ergometer   Level 1   Watts 8   Minutes 10   Arm/Foot Ergometer   Level 4   Watts 12   Minutes 10   Cybex   Level 1   RPM 50   Minutes 10   Recumbant Elliptical   Level 1   RPM 40   Watts 10   Minutes 10   Elliptical   Level 1   Speed 3   Minutes 1   REL-XR   Level 1   Watts 25   Minutes 10   Prescription Details   Frequency (times per week) 3   Duration Progress to 30 minutes of continuous aerobic without signs/symptoms of physical distress   Intensity   THRR REST +  30   Ratings of Perceived Exertion 11-15   Progression Continue progressive overload as per policy without signs/symptoms or physical distress.   Resistance Training   Training Prescription Yes   Weight 2   Reps 10-15      Exercise Prescription Changes:     Exercise Prescription Changes      01/27/15 0600           Exercise Review   Progression Yes       Response to Exercise   Blood Pressure (Admit) 148/82 mmHg       Blood Pressure (Exercise) 146/82 mmHg       Blood Pressure (Exit) 136/84 mmHg       Heart Rate (Admit) 67 bpm       Heart Rate (Exercise) 67 bpm       Heart Rate (Exit) 65 bpm       Rating of Perceived Exertion (Exercise) 11       Symptoms No       Duration Progress to 30 minutes of continuous aerobic without signs/symptoms of physical distress       Intensity Rest + 30       Progression Continue progressive overload as per policy without signs/symptoms or physical distress.       Resistance Training   Training Prescription Yes       Weight 2       Reps 10-15       Interval Training   Interval Training No       NuStep   Level 2       Watts 15        Minutes 20          Discharge Exercise Prescription (Final Exercise Prescription Changes):     Exercise Prescription Changes - 01/27/15 0600    Exercise Review   Progression Yes   Response to Exercise   Blood Pressure (Admit) 148/82 mmHg   Blood Pressure (Exercise) 146/82 mmHg   Blood Pressure (Exit) 136/84 mmHg   Heart Rate (Admit) 67 bpm   Heart Rate (Exercise) 67 bpm   Heart Rate (Exit) 65 bpm   Rating of Perceived Exertion (Exercise) 11   Symptoms No   Duration Progress to 30 minutes of continuous aerobic without signs/symptoms of physical distress   Intensity Rest + 30   Progression Continue progressive overload as per policy without signs/symptoms or physical distress.   Resistance Training   Training Prescription Yes   Weight 2   Reps 10-15   Interval Training   Interval Training No   NuStep   Level 2   Watts 15   Minutes 20  Nutrition:  Target Goals: Understanding of nutrition guidelines, daily intake of sodium '1500mg'$ , cholesterol '200mg'$ , calories 30% from fat and 7% or less from saturated fats, daily to have 5 or more servings of fruits and vegetables.  Biometrics:     Pre Biometrics - 01/20/15 1451    Pre Biometrics   Height 5' 8.5" (1.74 m)   Weight (!) 307 lb 6.4 oz (139.436 kg)   Waist Circumference 58.25 inches   Hip Circumference 59 inches   Waist to Hip Ratio 0.99 %   BMI (Calculated) 46.2       Nutrition Therapy Plan and Nutrition Goals:   Nutrition Discharge: Rate Your Plate Scores:   Nutrition Goals Re-Evaluation:   Psychosocial: Target Goals: Acknowledge presence or absence of depression, maximize coping skills, provide positive support system. Participant is able to verbalize types and ability to use techniques and skills needed for reducing stress and depression.  Initial Review & Psychosocial Screening:     Initial Psych Review & Screening - 01/20/15 1746    Initial Review   Current issues with Current  Depression;History of Depression;Current Psychotropic Meds;Current Sleep Concerns   Family Dynamics   Good Support System? Yes   Comments Patient states he lives with his sister Kerry Dory, "who is opinionated and has red hair."  His sister Jeani Hawking and his daughter [name] are his main supporters.  He would like to get stronger and try to move into senior housing.  Divorced, has one daughter and one grand-daughter.  See PHQ-9 score.     Barriers   Psychosocial barriers to participate in program The patient should benefit from training in stress management and relaxation.  Pt has a PHQ-9 score of 24.     Screening Interventions   Interventions Program counselor consult      Quality of Life Scores:     Quality of Life - 01/20/15 1752    Quality of Life Scores   Health/Function Pre 6.23 %   Socioeconomic Pre 10.5 %   Psych/Spiritual Pre 5.79 %   Family Pre 5.9 %   GLOBAL Pre 6.97 %      PHQ-9:     Recent Review Flowsheet Data    Depression screen [facility] 2/9 01/20/2015   Decreased Interest 3   Down, Depressed, Hopeless 3   PHQ - 2 Score 6   Altered sleeping 3   Tired, decreased energy 3   Change in appetite 3   Feeling bad or failure about yourself  3   Trouble concentrating 3   Moving slowly or fidgety/restless 3   Suicidal thoughts 0   PHQ-9 Score 24   Difficult doing work/chores Extremely dIfficult      Psychosocial Evaluation and Intervention:     Psychosocial Evaluation - 01/26/15 1017    Psychosocial Evaluation & Interventions   Interventions Stress management education;Relaxation education;Encouraged to exercise with the program and follow exercise prescription   Comments Counselor met with Mr. Alvidrez today for initial psychosocial evaluation.  He is a 64 year old who reports having multiple health issues, including more recently a heart attack in August.  He has a limited support system as he lives with his sister and brother-in-law and has a daughter close by.  Mr. Giammarino  reports sleeping "odd hours" with help of a CPAP.  He admits to a history of "clinical depression" and is currently on medications for this and for anxiety.  His PHQ-9 scores were significantly high so counselor questioned efficacy of his medications, with  Mr. Viona Gilmore stating they are not very effective.  He admits to multiple stressors, including his health, his living arrangements with his sister and brother-in-law and finances.  His mood was depressed and he engaged in a great deal of negative self-talk, denying suicidal ideations currently.  Therapist recommended seeing a therapist and Mr. Viona Gilmore states he "cannot afford the copay."  Counselor will research local programs to meet this need and educated Mr. Viona Gilmore on the benefit of medications and psychotherapy for clinical depression.  Counselor will follow up regularly with Mr. Viona Gilmore.   Continued Psychosocial Services Needed Yes  Mr. W will benefit from psychotherapy for his clinical depression.  He also will benefit from the psychoeducational components of this program, and meeting with the dietician to address weight loss goals.       Psychosocial Re-Evaluation:   Vocational Rehabilitation: Provide vocational rehab assistance to qualifying candidates.   Vocational Rehab Evaluation & Intervention:     Vocational Rehab - 01/20/15 1737    Initial Vocational Rehab Evaluation & Intervention   Assessment shows need for Vocational Rehabilitation No      Education: Education Goals: Education classes will be provided on a weekly basis, covering required topics. Participant will state understanding/return demonstration of topics presented.  Learning Barriers/Preferences:     Learning Barriers/Preferences - 01/20/15 1735    Learning Barriers/Preferences   Learning Barriers Hearing   Learning Preferences Written Material      Education Topics: General Nutrition Guidelines/Fats and Fiber: -Group instruction provided by verbal, written material, models and  posters to present the general guidelines for heart healthy nutrition. Gives an explanation and review of dietary fats and fiber.   Controlling Sodium/Reading Food Labels: -Group verbal and written material supporting the discussion of sodium use in heart healthy nutrition. Review and explanation with models, verbal and written materials for utilization of the food label.   Exercise Physiology & Risk Factors: - Group verbal and written instruction with models to review the exercise physiology of the cardiovascular system and associated critical values. Details cardiovascular disease risk factors and the goals associated with each risk factor.   Aerobic Exercise & Resistance Training: - Gives group verbal and written discussion on the health impact of inactivity. On the components of aerobic and resistive training programs and the benefits of this training and how to safely progress through these programs.   Flexibility, Balance, General Exercise Guidelines: - Provides group verbal and written instruction on the benefits of flexibility and balance training programs. Provides general exercise guidelines with specific guidelines to those with heart or lung disease. Demonstration and skill practice provided.   Stress Management: - Provides group verbal and written instruction about the health risks of elevated stress, cause of high stress, and healthy ways to reduce stress.   Depression: - Provides group verbal and written instruction on the correlation between heart/lung disease and depressed mood, treatment options, and the stigmas associated with seeking treatment.   Anatomy & Physiology of the Heart: - Group verbal and written instruction and models provide basic cardiac anatomy and physiology, with the coronary electrical and arterial systems. Review of: AMI, Angina, Valve disease, Heart Failure, Cardiac Arrhythmia, Pacemakers, and the ICD.   Cardiac Procedures: - Group verbal and  written instruction and models to describe the testing methods done to diagnose heart disease. Reviews the outcomes of the test results. Describes the treatment choices: Medical Management, Angioplasty, or Coronary Bypass Surgery.   Cardiac Medications: - Group verbal and written instruction to review commonly  prescribed medications for heart disease. Reviews the medication, class of the drug, and side effects. Includes the steps to properly store meds and maintain the prescription regimen.   Go Sex-Intimacy & Heart Disease, Get SMART - Goal Setting: - Group verbal and written instruction through game format to discuss heart disease and the return to sexual intimacy. Provides group verbal and written material to discuss and apply goal setting through the application of the S.M.A.R.T. Method.   Other Matters of the Heart: - Provides group verbal, written materials and models to describe Heart Failure, Angina, Valve Disease, and Diabetes in the realm of heart disease. Includes description of the disease process and treatment options available to the cardiac patient.   Exercise & Equipment Safety: - Individual verbal instruction and demonstration of equipment use and safety with use of the equipment.          Cardiac Rehab from 01/20/2015 in [facility] Cardiac Rehab   Date  01/20/15   Educator  D. Joya Gaskins, RN   Instruction Review Code  1- partially meets, needs review/practice      Infection Prevention: - Provides verbal and written material to individual with discussion of infection control including proper hand washing and proper equipment cleaning during exercise session.      Cardiac Rehab from 01/20/2015 in [facility] Cardiac Rehab   Date  01/20/15   Educator  D. Joya Gaskins, RN   Instruction Review Code  2- meets goals/outcomes      Falls Prevention: - Provides verbal and written material to individual with discussion of falls prevention and safety.      Cardiac Rehab from 01/20/2015 in [facility] Cardiac  Rehab   Date  01/20/15   Educator  D. Joya Gaskins, RN   Instruction Review Code  1- partially meets, needs review/practice      Diabetes: - Individual verbal and written instruction to review signs/symptoms of diabetes, desired ranges of glucose level fasting, after meals and with exercise. Advice that pre and post exercise glucose checks will be done for 3 sessions at entry of program.      Cardiac Rehab from 01/20/2015 in [facility] Cardiac Rehab   Date  01/20/15   Educator  D. Joya Gaskins, RN   Instruction Review Code  1- partially meets, needs review/practice [Needs to meet wtih dietitian about meal planning]       Knowledge Questionnaire Score:     Knowledge Questionnaire Score - 01/20/15 1735    Knowledge Questionnaire Score   Pre Score 25/28      Personal Goals and Risk Factors at Admission:     Personal Goals and Risk Factors at Admission - 01/20/15 1745    Personal Goals and Risk Factors on Admission    Weight Management Yes   Intervention Learn and follow the exercise and diet guidelines while in the program. Utilize the nutrition and education classes to help gain knowledge of the diet and exercise expectations in the program   Admit Weight 307 lb 6.4 oz (139.436 kg)   Increase Aerobic Exercise and Physical Activity Yes   Diabetes Yes   Goal Blood glucose control identified by blood glucose values, HgbA1C. Participant verbalizes understanding of the signs/symptoms of hyper/hypo glycemia, proper foot care and importance of medication and nutrition plan for blood glucose control.   Intervention Provide nutrition & aerobic exercise along with prescribed medications to achieve blood glucose in normal ranges: Fasting 65-99 mg/dL   Hypertension Yes   Goal Participant will see blood pressure controlled within the values of  140/88mm/Hg or within value directed by their physician.   Intervention Provide nutrition & aerobic exercise along with prescribed medications to achieve BP 140/90 or  less.   Lipids Yes   Goal Cholesterol controlled with medications as prescribed, with individualized exercise RX and with personalized nutrition plan. Value goals: LDL < $Rem'70mg'yOym$ , HDL > $Rem'40mg'nMMl$ . Participant states understanding of desired cholesterol values and following prescriptions.   Intervention Provide nutrition & aerobic exercise along with prescribed medications to achieve LDL '70mg'$ , HDL >$Remo'40mg'GxmDm$ .   Stress Yes   Goal To meet with psychosocial counselor for stress and relaxation information and guidance. To state understanding of performing relaxation techniques and or identifying personal stressors.   Intervention Provide education on types of stress, identifiying stressors, and ways to cope with stress. Provide demonstration and active practice of relaxation techniques.      Personal Goals and Risk Factors Review:    Personal Goals Discharge:     Comments: 30 day review  Continue with current ITP. Martyn has attended 2 sessions.

## 2015-01-30 ENCOUNTER — Encounter: Payer: Commercial Managed Care - HMO | Admitting: *Deleted

## 2015-01-30 DIAGNOSIS — I214 Non-ST elevation (NSTEMI) myocardial infarction: Secondary | ICD-10-CM | POA: Diagnosis not present

## 2015-01-30 DIAGNOSIS — Z9889 Other specified postprocedural states: Secondary | ICD-10-CM | POA: Diagnosis not present

## 2015-01-30 LAB — GLUCOSE, CAPILLARY
Glucose-Capillary: 198 mg/dL — ABNORMAL HIGH (ref 65–99)
Glucose-Capillary: 144 mg/dL — ABNORMAL HIGH (ref 65–99)

## 2015-01-30 NOTE — Progress Notes (Signed)
Daily Session Note  Patient Details  Name: Victor Wise MRN: [id number] Date of Birth: 21-Mar-1951 Referring Provider:  Corey Skains, MD  Encounter Date: 01/30/2015  Check In:     Session Check In - 01/30/15 1012    Check-In   Staff Present Frederich Cha RRT, RCP Respiratory Therapist;Carroll Enterkin RN, Drusilla Kanner MS, ACSM CEP Exercise Physiologist   ER physicians immediately available to respond to emergencies See telemetry face sheet for immediately available ER MD   Medication changes reported     No   Fall or balance concerns reported    No   VAD Patient? No   Pain Assessment   Currently in Pain? No/denies   Multiple Pain Sites No         Goals Met:  Independence with exercise equipment Exercise tolerated well No report of cardiac concerns or symptoms Strength training completed today  Goals Unmet:  Not Applicable  Goals Comments: [name] said he can already see some progress in his endurance from coming to exercise class. He is very encouraged by this.    Dr. Emily Filbert is Medical Director for [place] and [facility].

## 2015-02-02 ENCOUNTER — Encounter: Payer: Commercial Managed Care - HMO | Admitting: *Deleted

## 2015-02-02 DIAGNOSIS — I214 Non-ST elevation (NSTEMI) myocardial infarction: Secondary | ICD-10-CM

## 2015-02-02 DIAGNOSIS — Z9889 Other specified postprocedural states: Secondary | ICD-10-CM | POA: Diagnosis not present

## 2015-02-02 NOTE — Progress Notes (Signed)
Daily Session Note  Patient Details  Name: Victor Wise MRN: [id number] Date of Birth: 1951-05-16 Referring Provider:  Corey Skains, MD  Encounter Date: 02/02/2015  Check In:     Session Check In - 02/02/15 0859    Check-In   Staff Present Candiss Norse MS, ACSM CEP Exercise Physiologist;Kelly Alfonso Patten, ACSM CEP Exercise Physiologist;Susanne Bice RN, BSN, [place]   ER physicians immediately available to respond to emergencies See telemetry face sheet for immediately available ER MD   Medication changes reported     No   Fall or balance concerns reported    No   Warm-up and Cool-down Performed on first and last piece of equipment   VAD Patient? No   Pain Assessment   Currently in Pain? No/denies   Multiple Pain Sites No         Goals Met:  Independence with exercise equipment Exercise tolerated well No report of cardiac concerns or symptoms Strength training completed today  Goals Unmet:  Not Applicable  Goals Comments: [name] decreased his workload today on the Biostep to level 2. He had tried level 4 at his previous exercise session and stated that later that day his legs were extremely week due to the increased workload. He tolerated level 2 well today and we discussed gradual progression for the future with possible intervals next time he is ready to increase.    Dr. Emily Filbert is Medical Director for [place] and [facility].

## 2015-02-04 ENCOUNTER — Encounter: Payer: Commercial Managed Care - HMO | Admitting: *Deleted

## 2015-02-04 DIAGNOSIS — I214 Non-ST elevation (NSTEMI) myocardial infarction: Secondary | ICD-10-CM | POA: Diagnosis not present

## 2015-02-04 DIAGNOSIS — Z9889 Other specified postprocedural states: Secondary | ICD-10-CM | POA: Diagnosis not present
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
## 2015-02-04 NOTE — Progress Notes (Signed)
Daily Session Note  Patient Details  Name: Victor Wise MRN: [id number] Date of Birth: [birth year] Referring Provider:  Corey Skains, MD  Encounter Date: 02/04/2015  Check In:     Session Check In - 02/04/15 0850    Check-In   Staff Present Candiss Norse MS, ACSM CEP Exercise Physiologist;Susanne Bice RN, BSN, CCRP;Steven Way BS, ACSM EP-C, Exercise Physiologist   ER physicians immediately available to respond to emergencies See telemetry face sheet for immediately available ER MD   Medication changes reported     No   Fall or balance concerns reported    No   Warm-up and Cool-down Performed on first and last piece of equipment   VAD Patient? No   Pain Assessment   Currently in Pain? No/denies   Multiple Pain Sites No         Goals Met:  Independence with exercise equipment Exercise tolerated well Personal goals reviewed No report of cardiac concerns or symptoms Strength training completed today  Goals Unmet:  Not Applicable  Goals Comments: Reviewed individualized exercise prescription and made increases per departmental policy. Exercise increases were discussed with the patient and they were able to perform the new work loads without issue (no signs or symptoms).     Dr. Emily Filbert is Medical Director for [place] and [facility].

## 2015-02-06 ENCOUNTER — Encounter: Payer: Commercial Managed Care - HMO | Admitting: *Deleted

## 2015-02-06 DIAGNOSIS — Z9889 Other specified postprocedural states: Secondary | ICD-10-CM | POA: Diagnosis not present

## 2015-02-06 DIAGNOSIS — I214 Non-ST elevation (NSTEMI) myocardial infarction: Secondary | ICD-10-CM

## 2015-02-06 DIAGNOSIS — Z9861 Coronary angioplasty status: Secondary | ICD-10-CM

## 2015-02-06 NOTE — Progress Notes (Signed)
Daily Session Note  Patient Details  Name: Victor Wise MRN: [id number] Date of Birth: [birth year] Referring Provider:  Corey Skains, MD  Encounter Date: 02/06/2015  Check In:     Session Check In - 02/06/15 0937    Check-In   Staff Present Candiss Norse MS, ACSM CEP Exercise Physiologist;Carroll Enterkin RN, BSN;Susanne Bice RN, BSN, [place]   ER physicians immediately available to respond to emergencies See telemetry face sheet for immediately available ER MD   Medication changes reported     No   Fall or balance concerns reported    No   VAD Patient? No   Pain Assessment   Currently in Pain? No/denies   Multiple Pain Sites No         Goals Met:  Independence with exercise equipment Exercise tolerated well No report of cardiac concerns or symptoms Strength training completed today  Goals Unmet:  Not Applicable  Goals Comments: [name] is continuing to branch out on different equipment and used the T4 NS today in addition to his regular BioStep.   Dr. Emily Filbert is Medical Director for [place] and [facility].

## 2015-02-09 ENCOUNTER — Encounter: Payer: Commercial Managed Care - HMO | Admitting: *Deleted

## 2015-02-09 DIAGNOSIS — Z9861 Coronary angioplasty status: Secondary | ICD-10-CM

## 2015-02-09 DIAGNOSIS — Z9889 Other specified postprocedural states: Secondary | ICD-10-CM | POA: Diagnosis not present

## 2015-02-09 DIAGNOSIS — I214 Non-ST elevation (NSTEMI) myocardial infarction: Secondary | ICD-10-CM | POA: Diagnosis not present

## 2015-02-09 DIAGNOSIS — E1142 Type 2 diabetes mellitus with diabetic polyneuropathy: Secondary | ICD-10-CM | POA: Diagnosis not present

## 2015-02-09 DIAGNOSIS — B351 Tinea unguium: Secondary | ICD-10-CM | POA: Diagnosis not present

## 2015-02-09 DIAGNOSIS — L851 Acquired keratosis [keratoderma] palmaris et plantaris: Secondary | ICD-10-CM | POA: Diagnosis not present

## 2015-02-09 DIAGNOSIS — L97521 Non-pressure chronic ulcer of other part of left foot limited to breakdown of skin: Secondary | ICD-10-CM | POA: Diagnosis not present

## 2015-02-09 NOTE — Progress Notes (Signed)
Daily Session Note  Patient Details  Name: Victor Wise MRN: [id number] Date of Birth: 1951/03/23 Referring Winfrey Chillemi:  Corey Skains, MD  Encounter Date: 02/09/2015  Check In:     Session Check In - 02/09/15 0843    Check-In   Staff Present Candiss Norse MS, ACSM CEP Exercise Physiologist;Kelly Alfonso Patten, ACSM CEP Exercise Physiologist;Susanne Bice RN, BSN, [place]   ER physicians immediately available to respond to emergencies See telemetry face sheet for immediately available ER MD   Medication changes reported     No   Fall or balance concerns reported    No   Warm-up and Cool-down Performed on first and last piece of equipment   VAD Patient? No   Pain Assessment   Currently in Pain? No/denies   Multiple Pain Sites No         Goals Met:  Independence with exercise equipment Exercise tolerated well No report of cardiac concerns or symptoms Strength training completed today  Goals Unmet:  Not Applicable  Goals Comments: [name] said he had a good weekend and is feeling good and is ready for exercise.    Dr. Emily Filbert is Medical Director for [place] and [facility].

## 2015-02-10 DIAGNOSIS — I2109 ST elevation (STEMI) myocardial infarction involving other coronary artery of anterior wall: Secondary | ICD-10-CM | POA: Diagnosis not present

## 2015-02-10 DIAGNOSIS — I2510 Atherosclerotic heart disease of native coronary artery without angina pectoris: Secondary | ICD-10-CM | POA: Diagnosis not present

## 2015-02-10 DIAGNOSIS — I110 Hypertensive heart disease with heart failure: Secondary | ICD-10-CM | POA: Diagnosis not present

## 2015-02-11 DIAGNOSIS — Z9889 Other specified postprocedural states: Secondary | ICD-10-CM | POA: Diagnosis not present

## 2015-02-11 DIAGNOSIS — I214 Non-ST elevation (NSTEMI) myocardial infarction: Secondary | ICD-10-CM | POA: Diagnosis not present

## 2015-02-11 DIAGNOSIS — Z9861 Coronary angioplasty status: Secondary | ICD-10-CM

## 2015-02-11 NOTE — Progress Notes (Signed)
Daily Session Note  Patient Details  Name: Victor Wise MRN: [id number] Date of Birth: [birth year] Referring Provider:  Corey Skains, MD  Encounter Date: 02/11/2015  Check In:     Session Check In - 02/11/15 0901    Check-In   Staff Present Candiss Norse MS, ACSM CEP Exercise Physiologist;Other;Susanne Bice RN, BSN, [place]   ER physicians immediately available to respond to emergencies See telemetry face sheet for immediately available ER MD   Medication changes reported     No   Fall or balance concerns reported    No   Warm-up and Cool-down Performed on first and last piece of equipment   VAD Patient? No   Pain Assessment   Currently in Pain? No/denies           Exercise Prescription Changes - 02/11/15 0900    Exercise Review   Progression Yes   Response to Exercise   Duration Progress to 30 minutes of continuous aerobic without signs/symptoms of physical distress   Intensity Rest + 30   Progression Continue progressive overload as per policy without signs/symptoms or physical distress.   Resistance Training   Training Prescription Yes   Weight 3   Reps 10-15   Interval Training   Interval Training No   NuStep   Level 6   Watts 40   Minutes 20   Recumbant Elliptical   Level 4  Bio Step   RPM 40   Watts 25   Minutes 10      Goals Met:  Independence with exercise equipment Exercise tolerated well No report of cardiac concerns or symptoms Strength training completed today  Goals Unmet:  Not Applicable  Goals Comments:    Dr. Emily Filbert is Medical Director for [place] and [facility].

## 2015-02-13 DIAGNOSIS — I214 Non-ST elevation (NSTEMI) myocardial infarction: Secondary | ICD-10-CM | POA: Diagnosis not present

## 2015-02-13 DIAGNOSIS — Z9889 Other specified postprocedural states: Secondary | ICD-10-CM | POA: Diagnosis not present

## 2015-02-13 DIAGNOSIS — Z9861 Coronary angioplasty status: Secondary | ICD-10-CM

## 2015-02-13 NOTE — Progress Notes (Signed)
Cardiac Individual Treatment Plan  Patient Details  Name: Victor Wise MRN: [id number] Date of Birth: [birth year] Referring Travonta Gill:  Corey Skains, MD  Initial Encounter Date:    Visit Diagnosis: NSTEMI (non-ST elevated myocardial infarction)  History of percutaneous coronary intervention  Patient's Home Medications on Admission:  Current outpatient prescriptions:  .  amLODipine (NORVASC) 10 MG tablet, Take 10 mg by mouth daily., Disp: , Rfl:  .  aspirin EC 81 MG tablet, Take 81 mg by mouth daily., Disp: , Rfl:  .  aspirin-acetaminophen-caffeine (EXCEDRIN MIGRAINE) 250-250-65 MG per tablet, Take 2 tablets by mouth every 6 (six) hours as needed for headache., Disp: , Rfl:  .  benazepril (LOTENSIN) 40 MG tablet, Take 40 mg by mouth 2 (two) times daily. , Disp: , Rfl:  .  buPROPion (WELLBUTRIN XL) 150 MG 24 hr tablet, Take 150 mg by mouth 2 (two) times daily., Disp: , Rfl:  .  busPIRone (BUSPAR) 5 MG tablet, Take by mouth., Disp: , Rfl:  .  cetirizine (ZYRTEC) 10 MG tablet, Take 10 mg by mouth daily., Disp: , Rfl:  .  clopidogrel (PLAVIX) 75 MG tablet, Take 75 mg by mouth daily., Disp: , Rfl:  .  DULoxetine (CYMBALTA) 30 MG capsule, Take 30-60 mg by mouth 2 (two) times daily. 60 mg every morning and 30 mg every evening., Disp: , Rfl:  .  DULoxetine (CYMBALTA) 30 MG capsule, Take 90 mg by mouth daily., Disp: , Rfl:  .  fenofibrate 54 MG tablet, Take 54 mg by mouth every evening., Disp: , Rfl:  .  fexofenadine (ALLEGRA) 180 MG tablet, Take 180 mg by mouth daily., Disp: , Rfl:  .  fluticasone (FLONASE) 50 MCG/ACT nasal spray, Place 2 sprays into both nostrils daily., Disp: , Rfl:  .  glipiZIDE-metformin (METAGLIP) 5-500 MG per tablet, Take 2 tablets by mouth 2 (two) times daily., Disp: , Rfl:  .  hydrochlorothiazide (MICROZIDE) 12.5 MG capsule, Take 12.5 mg by mouth daily., Disp: , Rfl:  .  insulin aspart (NOVOLOG FLEXPEN) 100 UNIT/ML FlexPen, Inject 0-15 Units into the skin 3 (three)  times daily with meals as needed for high blood sugar. , Disp: , Rfl:  .  Insulin Glargine (LANTUS SOLOSTAR) 100 UNIT/ML Solostar Pen, Inject 41 Units into the skin at bedtime., Disp: , Rfl:  .  levofloxacin (LEVAQUIN) 750 MG tablet, Take 1 tablet (750 mg total) by mouth daily. (Patient not taking: Reported on 01/20/2015), Disp: 3 tablet, Rfl: 0 .  Lido-Capsaicin-Men-Methyl Sal (MEDI-PATCH-LIDOCAINE EX), Apply topically., Disp: , Rfl:  .  Liraglutide (VICTOZA) 18 MG/3ML SOPN, Inject 0.6 mg into the skin every evening. , Disp: , Rfl:  .  metaxalone (SKELAXIN) 800 MG tablet, Take 800 mg by mouth 3 (three) times daily. Take half tab orally prn, Disp: , Rfl:  .  metoprolol tartrate (LOPRESSOR) 25 MG tablet, Take 0.5 tablets (12.5 mg total) by mouth 2 (two) times daily., Disp: 30 tablet, Rfl: 1 .  mometasone (NASONEX) 50 MCG/ACT nasal spray, Place 2 sprays into the nose daily., Disp: , Rfl:  .  montelukast (SINGULAIR) 10 MG tablet, Take 10 mg by mouth daily., Disp: , Rfl:  .  nitroGLYCERIN (NITROSTAT) 0.4 MG SL tablet, Place 1 tablet (0.4 mg total) under the tongue every 5 (five) minutes x 3 doses as needed for chest pain., Disp: 25 tablet, Rfl: 1 .  Omega-3 Krill Oil 300 MG CAPS, Take 1 capsule by mouth daily., Disp: , Rfl:  .  Oxcarbazepine (  TRILEPTAL) 300 MG tablet, Take 300 mg by mouth 2 (two) times daily. , Disp: , Rfl:  .  oxyCODONE (OXY IR/ROXICODONE) 5 MG immediate release tablet, Take 5 mg by mouth 3 (three) times daily as needed for moderate pain. , Disp: , Rfl:  .  OxyCODONE (OXYCONTIN) 20 mg T12A 12 hr tablet, Take 20 mg by mouth 3 (three) times daily., Disp: , Rfl:  .  pantoprazole (PROTONIX) 40 MG tablet, Take 40 mg by mouth daily., Disp: , Rfl:  .  pregabalin (LYRICA) 200 MG capsule, Take 200 mg by mouth 3 (three) times daily., Disp: , Rfl:  .  tamsulosin (FLOMAX) 0.4 MG CAPS capsule, Take 0.4 mg by mouth daily., Disp: , Rfl:   Past Medical History: Past Medical History  Diagnosis Date   . Diabetes   . Hypertension   . BPH (benign prostatic hyperplasia)   . Osteomyelitis of foot   . Morbid obesity   . Obstructive sleep apnea   . Chronic back pain   . Depression   . UTI (lower urinary tract infection)     Tobacco Use: History  Smoking status  . Former Smoker  . Types: Cigarettes  Smokeless tobacco  . Not on file    Labs: Recent Review Flowsheet Data    Labs for ITP Cardiac and Pulmonary Rehab Latest Ref Rng 08/30/2013 12/19/2014 12/20/2014   Cholestrol 0 - 200 mg/dL 180 - 147   LDLCALC 0 - 99 mg/dL SEE COMMENT - UNABLE TO CALCULATE IF TRIGLYCERIDE OVER 400 mg/dL   HDL >40 mg/dL 27(L) - 14(L)   Trlycerides <150 mg/dL 465(H) - 512(H)   Hemoglobin A1c 4.8 - 5.6 % - 7.8(H) -       Exercise Target Goals:    Exercise Program Goal: Individual exercise prescription set with THRR, safety & activity barriers. Participant demonstrates ability to understand and report RPE using BORG scale, to self-measure pulse accurately, and to acknowledge the importance of the exercise prescription.  Exercise Prescription Goal: Starting with aerobic activity 30 plus minutes a day, 3 days per week for initial exercise prescription. Provide home exercise prescription and guidelines that participant acknowledges understanding prior to discharge.  Activity Barriers & Risk Stratification:     Activity Barriers & Risk Stratification - 01/20/15 1754    Activity Barriers & Risk Stratification   Activity Barriers Balance Concerns;History of Falls;Neck/Spine Problems;Back Problems;Deconditioning;Muscular Weakness;Assistive Device;Joint Problems      6 Minute Walk:     6 Minute Walk      01/20/15 1513       6 Minute Walk   Phase Initial     Distance 470 feet     Walk Time 4.5 minutes     Resting HR 75 bpm     Resting BP 100/60 mmHg     Max Ex. HR 113 bpm     Max Ex. BP 132/60 mmHg     RPE 17     Symptoms No        Initial Exercise Prescription:     Initial Exercise  Prescription - 01/20/15 1500    Date of Initial Exercise Prescription   Date 01/20/15   Treadmill   MPH 0.8  Use intervals to complete time goals   Grade 0   Minutes 5   Bike   Level 0.2   Minutes 10   Recumbant Bike   Level 2   RPM 40   Watts 20   Minutes 15   NuStep   Level  2   Watts 30   Minutes 10   Arm Ergometer   Level 1   Watts 8   Minutes 10   Arm/Foot Ergometer   Level 4   Watts 12   Minutes 10   Cybex   Level 1   RPM 50   Minutes 10   Recumbant Elliptical   Level 1   RPM 40   Watts 10   Minutes 10   Elliptical   Level 1   Speed 3   Minutes 1   REL-XR   Level 1   Watts 25   Minutes 10   Prescription Details   Frequency (times per week) 3   Duration Progress to 30 minutes of continuous aerobic without signs/symptoms of physical distress   Intensity   THRR REST +  30   Ratings of Perceived Exertion 11-15   Progression Continue progressive overload as per policy without signs/symptoms or physical distress.   Resistance Training   Training Prescription Yes   Weight 2   Reps 10-15      Exercise Prescription Changes:     Exercise Prescription Changes      01/27/15 0600 02/04/15 0800 02/11/15 0900       Exercise Review   Progression Yes Yes Yes     Response to Exercise   Blood Pressure (Admit) 148/82 mmHg       Blood Pressure (Exercise) 146/82 mmHg       Blood Pressure (Exit) 136/84 mmHg       Heart Rate (Admit) 67 bpm       Heart Rate (Exercise) 67 bpm       Heart Rate (Exit) 65 bpm       Rating of Perceived Exertion (Exercise) 11       Symptoms No       Duration Progress to 30 minutes of continuous aerobic without signs/symptoms of physical distress Progress to 30 minutes of continuous aerobic without signs/symptoms of physical distress Progress to 30 minutes of continuous aerobic without signs/symptoms of physical distress     Intensity Rest + 30 Rest + 30 Rest + 30     Progression Continue progressive overload as per policy without  signs/symptoms or physical distress. Continue progressive overload as per policy without signs/symptoms or physical distress. Continue progressive overload as per policy without signs/symptoms or physical distress.     Resistance Training   Training Prescription Yes Yes Yes     Weight _0 Reps 10-15 10-15 10-15     Interval Training   Interval Training No No No     NuStep   Level _1 Watts 15 15 40     Minutes _2 Recumbant Elliptical   Level  1 4  Bio Step     RPM  40 40     Watts  10 25     Minutes  10 10        Discharge Exercise Prescription (Final Exercise Prescription Changes):     Exercise Prescription Changes - 02/11/15 0900    Exercise Review   Progression Yes   Response to Exercise   Duration Progress to 30 minutes of continuous aerobic without signs/symptoms of physical distress   Intensity Rest + 30   Progression Continue progressive overload as per policy without signs/symptoms or physical distress.   Resistance Training   Training Prescription Yes   Weight 3  Reps 10-15   Interval Training   Interval Training No   NuStep   Level 6   Watts 40   Minutes 20   Recumbant Elliptical   Level 4  Bio Step   RPM 40   Watts 25   Minutes 10      Nutrition:  Target Goals: Understanding of nutrition guidelines, daily intake of sodium <1559m, cholesterol <2036m calories 30% from fat and 7% or less from saturated fats, daily to have 5 or more servings of fruits and vegetables.  Biometrics:     Pre Biometrics - 01/20/15 1451    Pre Biometrics   Height 5' 8.5" (1.74 m)   Weight (!) 307 lb 6.4 oz (139.436 kg)   Waist Circumference 58.25 inches   Hip Circumference 59 inches   Waist to Hip Ratio 0.99 %   BMI (Calculated) 46.2       Nutrition Therapy Plan and Nutrition Goals:     Nutrition Therapy & Goals - 01/30/15 1635    Nutrition Therapy   Diet Instructed on a meal plan based on 1800 calories including dietary guidelines for  diabetes and heart health.   Fiber 30 grams   Whole Grain Foods 3 servings   Protein 8 ounces/day   Saturated Fats 12 max. grams   Fruits and Vegetables 5 servings/day   Personal Nutrition Goals   Personal Goal #1 Include at least 2 servings of carbohydrate per meal with range of 2-4 servings/meal.   Personal Goal #2 Balance meals with protein, 2-4 servings of carbohydrate and "free vegetables"   Personal Goal #3 Space 3 meals/day 4-5 hours apart   Comments At present, patient does not have a consistant meal plan and typically averages 2 meals per day + snacks.      Nutrition Discharge: Rate Your Plate Scores:     Rate Your Plate - 0950/[id number]/2667124  Rate Your Plate Scores   Pre Score 74   Pre Score % 82 %      Nutrition Goals Re-Evaluation:   Psychosocial: Target Goals: Acknowledge presence or absence of depression, maximize coping skills, provide positive support system. Participant is able to verbalize types and ability to use techniques and skills needed for reducing stress and depression.  Initial Review & Psychosocial Screening:     Initial Psych Review & Screening - 01/20/15 1746    Initial Review   Current issues with Current Depression;History of Depression;Current Psychotropic Meds;Current Sleep Concerns   Family Dynamics   Good Support System? Yes   Comments Patient states he lives with his sister LyKerry Dory"who is opinionated and has red hair."  His sister LyJeani Hawkingnd his daughter JeAnderson Maltare his main supporters.  He would like to get stronger and try to move into senior housing.  Divorced, has one daughter and one grand-daughter.  See PHQ-9 score.     Barriers   Psychosocial barriers to participate in program The patient should benefit from training in stress management and relaxation.  Pt has a PHQ-9 score of 24.     Screening Interventions   Interventions Program counselor consult      Quality of Life Scores:     Quality of Life - 01/20/15 1752     Quality of Life Scores   Health/Function Pre 6.23 %   Socioeconomic Pre 10.5 %   Psych/Spiritual Pre 5.79 %   Family Pre 5.9 %   GLOBAL Pre 6.97 %      PHQ-9:  Recent Review Flowsheet Data    Depression screen [facility] 2/9 01/20/2015   Decreased Interest 3   Down, Depressed, Hopeless 3   PHQ - 2 Score 6   Altered sleeping 3   Tired, decreased energy 3   Change in appetite 3   Feeling bad or failure about yourself  3   Trouble concentrating 3   Moving slowly or fidgety/restless 3   Suicidal thoughts 0   PHQ-9 Score 24   Difficult doing work/chores Extremely dIfficult      Psychosocial Evaluation and Intervention:     Psychosocial Evaluation - 01/26/15 1017    Psychosocial Evaluation & Interventions   Interventions Stress management education;Relaxation education;Encouraged to exercise with the program and follow exercise prescription   Comments Counselor met with Mr. Geisinger today for initial psychosocial evaluation.  He is a 64 year old who reports having multiple health issues, including more recently a heart attack in August.  He has a limited support system as he lives with his sister and brother-in-law and has a daughter close by.  Mr. Pennypacker reports sleeping "odd hours" with help of a CPAP.  He admits to a history of "clinical depression" and is currently on medications for this and for anxiety.  His PHQ-9 scores were significantly high so counselor questioned efficacy of his medications, with Mr. Viona Gilmore stating they are not very effective.  He admits to multiple stressors, including his health, his living arrangements with his sister and brother-in-law and finances.  His mood was depressed and he engaged in a great deal of negative self-talk, denying suicidal ideations currently.  Therapist recommended seeing a therapist and Mr. Viona Gilmore states he "cannot afford the copay."  Counselor will research local programs to meet this need and educated Mr. Viona Gilmore on the benefit of medications and psychotherapy  for clinical depression.  Counselor will follow up regularly with Mr. Viona Gilmore.   Continued Psychosocial Services Needed Yes  Mr. W will benefit from psychotherapy for his clinical depression.  He also will benefit from the psychoeducational components of this program, and meeting with the dietician to address weight loss goals.       Psychosocial Re-Evaluation:   Vocational Rehabilitation: Provide vocational rehab assistance to qualifying candidates.   Vocational Rehab Evaluation & Intervention:     Vocational Rehab - 01/20/15 1737    Initial Vocational Rehab Evaluation & Intervention   Assessment shows need for Vocational Rehabilitation No      Education: Education Goals: Education classes will be provided on a weekly basis, covering required topics. Participant will state understanding/return demonstration of topics presented.  Learning Barriers/Preferences:     Learning Barriers/Preferences - 01/20/15 1735    Learning Barriers/Preferences   Learning Barriers Hearing   Learning Preferences Written Material      Education Topics: General Nutrition Guidelines/Fats and Fiber: -Group instruction provided by verbal, written material, models and posters to present the general guidelines for heart healthy nutrition. Gives an explanation and review of dietary fats and fiber.   Controlling Sodium/Reading Food Labels: -Group verbal and written material supporting the discussion of sodium use in heart healthy nutrition. Review and explanation with models, verbal and written materials for utilization of the food label.   Exercise Physiology & Risk Factors: - Group verbal and written instruction with models to review the exercise physiology of the cardiovascular system and associated critical values. Details cardiovascular disease risk factors and the goals associated with each risk factor.   Aerobic Exercise & Resistance Training: - Gives group  verbal and written discussion on the  health impact of inactivity. On the components of aerobic and resistive training programs and the benefits of this training and how to safely progress through these programs.   Flexibility, Balance, General Exercise Guidelines: - Provides group verbal and written instruction on the benefits of flexibility and balance training programs. Provides general exercise guidelines with specific guidelines to those with heart or lung disease. Demonstration and skill practice provided.          Cardiac Rehab from 02/09/2015 in [facility] Cardiac Rehab   Date  02/02/15   Educator  [facility]   Instruction Review Code  2- meets goals/outcomes      Stress Management: - Provides group verbal and written instruction about the health risks of elevated stress, cause of high stress, and healthy ways to reduce stress.      Cardiac Rehab from 02/09/2015 in [facility] Cardiac Rehab   Date  02/04/15   Educator  [facility]   Instruction Review Code  2- meets goals/outcomes      Depression: - Provides group verbal and written instruction on the correlation between heart/lung disease and depressed mood, treatment options, and the stigmas associated with seeking treatment.   Anatomy & Physiology of the Heart: - Group verbal and written instruction and models provide basic cardiac anatomy and physiology, with the coronary electrical and arterial systems. Review of: AMI, Angina, Valve disease, Heart Failure, Cardiac Arrhythmia, Pacemakers, and the ICD.      Cardiac Rehab from 02/09/2015 in [facility] Cardiac Rehab   Date  02/09/15   Educator  SB   Instruction Review Code  2- meets goals/outcomes      Cardiac Procedures: - Group verbal and written instruction and models to describe the testing methods done to diagnose heart disease. Reviews the outcomes of the test results. Describes the treatment choices: Medical Management, Angioplasty, or Coronary Bypass Surgery.   Cardiac Medications: - Group verbal and written instruction to review  commonly prescribed medications for heart disease. Reviews the medication, class of the drug, and side effects. Includes the steps to properly store meds and maintain the prescription regimen.   Go Sex-Intimacy & Heart Disease, Get SMART - Goal Setting: - Group verbal and written instruction through game format to discuss heart disease and the return to sexual intimacy. Provides group verbal and written material to discuss and apply goal setting through the application of the S.M.A.R.T. Method.   Other Matters of the Heart: - Provides group verbal, written materials and models to describe Heart Failure, Angina, Valve Disease, and Diabetes in the realm of heart disease. Includes description of the disease process and treatment options available to the cardiac patient.   Exercise & Equipment Safety: - Individual verbal instruction and demonstration of equipment use and safety with use of the equipment.      Cardiac Rehab from 02/09/2015 in [facility] Cardiac Rehab   Date  01/20/15   Educator  D. Joya Gaskins, RN   Instruction Review Code  1- partially meets, needs review/practice      Infection Prevention: - Provides verbal and written material to individual with discussion of infection control including proper hand washing and proper equipment cleaning during exercise session.      Cardiac Rehab from 02/09/2015 in [facility] Cardiac Rehab   Date  01/20/15   Educator  D. Joya Gaskins, RN   Instruction Review Code  2- meets goals/outcomes      Falls Prevention: - Provides verbal and written material to individual with discussion of  falls prevention and safety.      Cardiac Rehab from 02/09/2015 in [facility] Cardiac Rehab   Date  01/20/15   Educator  D. Joya Gaskins, RN   Instruction Review Code  1- partially meets, needs review/practice      Diabetes: - Individual verbal and written instruction to review signs/symptoms of diabetes, desired ranges of glucose level fasting, after meals and with exercise. Advice that pre  and post exercise glucose checks will be done for 3 sessions at entry of program.      Cardiac Rehab from 02/09/2015 in [facility] Cardiac Rehab   Date  01/20/15   Educator  D. Joya Gaskins, RN   Instruction Review Code  1- partially meets, needs review/practice [Needs to meet wtih dietitian about meal planning]       Knowledge Questionnaire Score:     Knowledge Questionnaire Score - 01/20/15 1735    Knowledge Questionnaire Score   Pre Score 25/28      Personal Goals and Risk Factors at Admission:     Personal Goals and Risk Factors at Admission - 01/20/15 1745    Personal Goals and Risk Factors on Admission    Weight Management Yes   Intervention Learn and follow the exercise and diet guidelines while in the program. Utilize the nutrition and education classes to help gain knowledge of the diet and exercise expectations in the program   Admit Weight 307 lb 6.4 oz (139.436 kg)   Increase Aerobic Exercise and Physical Activity Yes   Diabetes Yes   Goal Blood glucose control identified by blood glucose values, HgbA1C. Participant verbalizes understanding of the signs/symptoms of hyper/hypo glycemia, proper foot care and importance of medication and nutrition plan for blood glucose control.   Intervention Provide nutrition & aerobic exercise along with prescribed medications to achieve blood glucose in normal ranges: Fasting 65-99 mg/dL   Hypertension Yes   Goal Participant will see blood pressure controlled within the values of 140/43m/Hg or within value directed by their physician.   Intervention Provide nutrition & aerobic exercise along with prescribed medications to achieve BP 140/90 or less.   Lipids Yes   Goal Cholesterol controlled with medications as prescribed, with individualized exercise RX and with personalized nutrition plan. Value goals: LDL < 765m HDL > 4069mParticipant states understanding of desired cholesterol values and following prescriptions.   Intervention Provide  nutrition & aerobic exercise along with prescribed medications to achieve LDL <44m26mDL >40mg82mStress Yes   Goal To meet with psychosocial counselor for stress and relaxation information and guidance. To state understanding of performing relaxation techniques and or identifying personal stressors.   Intervention Provide education on types of stress, identifiying stressors, and ways to cope with stress. Provide demonstration and active practice of relaxation techniques.      Personal Goals and Risk Factors Review:      Goals and Risk Factor Review      02/09/15 1020 02/11/15 0857         Weight Management   Goals Progress/Improvement seen Yes Yes      Comments Patient has been aware of sodium intake and has been reading labels and is more aware of how much sodium he is consuming. Current weight 315.9. Patient has increased vegetable intake and making smarter choices when eating at restaurants even though his numbers have not improved much on the scale.      Increase Aerobic Exercise and Physical Activity   Goals Progress/Improvement seen   Yes  Comments  plans to walk outside and home and want to walk on the treadmill before he graduates.         Personal Goals Discharge (Final Personal Goals and Risk Factors Review):      Goals and Risk Factor Review - 02/11/15 0857    Weight Management   Goals Progress/Improvement seen Yes   Comments Patient has increased vegetable intake and making smarter choices when eating at restaurants even though his numbers have not improved much on the scale.   Increase Aerobic Exercise and Physical Activity   Goals Progress/Improvement seen  Yes   Comments plans to walk outside and home and want to walk on the treadmill before he graduates.       Comments: Patient stated he had a fall on Wednesday afternoon. His legs gave out on him.  After talking with him, there were no injuries, he was just sore today. Due to his fall and soreness we reduced  his workloads in class and was not in pain.

## 2015-02-13 NOTE — Progress Notes (Signed)
Daily Session Note  Patient Details  Name: Victor Wise MRN: [id number] Date of Birth: 1950/08/25 Referring Provider:  Corey Skains, MD  Encounter Date: 02/13/2015  Check In:     Session Check In - 02/13/15 2706    Check-In   Staff Present Gerlene Burdock RN, BSN;Renee Dillard [place] MS, ACSM CEP Exercise Physiologist;Other   ER physicians immediately available to respond to emergencies See telemetry face sheet for immediately available ER MD   Medication changes reported     No   Fall or balance concerns reported    Yes   Comments Had a fall on Wednesday afternoon, his legs gave out on him. He did not have any injuries but, was sore. Due to his fall, his workloads were reduced today in class.   Warm-up and Cool-down Performed on first and last piece of equipment   VAD Patient? No   Pain Assessment   Currently in Pain? No/denies         Goals Met:  Independence with exercise equipment Exercise tolerated well No report of cardiac concerns or symptoms Strength training completed today  Goals Unmet:  Not Applicable  Goals Comments: Patient had a fall Wednesday afternoon and was sore today. Due to his fall and soreness, his workloads were reduced in class.    Dr. Emily Filbert is Medical Director for [facility].

## 2015-02-16 ENCOUNTER — Encounter: Payer: Commercial Managed Care - HMO | Attending: Internal Medicine | Admitting: *Deleted

## 2015-02-16 DIAGNOSIS — Z9889 Other specified postprocedural states: Secondary | ICD-10-CM | POA: Diagnosis not present

## 2015-02-16 DIAGNOSIS — I214 Non-ST elevation (NSTEMI) myocardial infarction: Secondary | ICD-10-CM | POA: Diagnosis not present

## 2015-02-16 NOTE — Progress Notes (Signed)
Daily Session Note  Patient Details  Name: [name] MRN: [id number] Date of Birth: 01-22-1951 Referring Provider:  Corey Skains, MD  Encounter Date: 02/16/2015  Check In:     Session Check In - 02/16/15 0846    Check-In   Staff Present Candiss Norse MS, ACSM CEP Exercise Physiologist;Josie Burleigh Alfonso Patten, ACSM CEP Exercise Physiologist;Carroll Enterkin RN, BSN   ER physicians immediately available to respond to emergencies See telemetry face sheet for immediately available ER MD   Medication changes reported     No   Fall or balance concerns reported    No   Warm-up and Cool-down Performed on first and last piece of equipment   VAD Patient? No   Pain Assessment   Currently in Pain? No/denies   Multiple Pain Sites No         Goals Met:  Independence with exercise equipment Exercise tolerated well No report of cardiac concerns or symptoms Strength training completed today  Goals Unmet:  Not Applicable  Goals Comments:    Dr. Emily Filbert is Medical Director for [facility].

## 2015-02-18 DIAGNOSIS — I214 Non-ST elevation (NSTEMI) myocardial infarction: Secondary | ICD-10-CM

## 2015-02-18 DIAGNOSIS — Z9889 Other specified postprocedural states: Secondary | ICD-10-CM | POA: Diagnosis not present

## 2015-02-18 NOTE — Progress Notes (Signed)
Daily Session Note  Patient Details  Name: Victor Wise MRN: [id number] Date of Birth: 1950/08/08 Referring Provider:  Corey Skains, MD  Encounter Date: 02/18/2015  Check In:     Session Check In - 02/18/15 7409    Check-In   Staff Present Nyoka Cowden RN;Carroll Enterkin RN, BSN;Jowel Waltner BS, ACSM EP-C, Exercise Physiologist   ER physicians immediately available to respond to emergencies See telemetry face sheet for immediately available ER MD   Medication changes reported     No   Fall or balance concerns reported    No   Warm-up and Cool-down Performed on first and last piece of equipment   VAD Patient? No   Pain Assessment   Currently in Pain? No/denies         Goals Met:  Proper associated with RPD/PD & O2 Sat Exercise tolerated well No report of cardiac concerns or symptoms Strength training completed today  Goals Unmet:  Not Applicable  Goals Comments:    Dr. Emily Filbert is Medical Director for [facility].

## 2015-02-23 ENCOUNTER — Encounter: Payer: Commercial Managed Care - HMO | Admitting: *Deleted

## 2015-02-23 DIAGNOSIS — I214 Non-ST elevation (NSTEMI) myocardial infarction: Secondary | ICD-10-CM | POA: Diagnosis not present

## 2015-02-23 DIAGNOSIS — Z9889 Other specified postprocedural states: Secondary | ICD-10-CM | POA: Diagnosis not present

## 2015-02-23 DIAGNOSIS — Z9861 Coronary angioplasty status: Secondary | ICD-10-CM

## 2015-02-23 NOTE — Progress Notes (Signed)
Daily Session Note  Patient Details  Name: Victor Wise MRN: [id number] Date of Birth: 1951/03/03 Referring Provider:  Corey Skains, MD  Encounter Date: 02/23/2015  Check In:     Session Check In - 02/23/15 0825    Check-In   Staff Present Candiss Norse MS, ACSM CEP Exercise Physiologist;Jonique Kulig Alfonso Patten, ACSM CEP Exercise Physiologist;Carroll Enterkin RN, BSN   ER physicians immediately available to respond to emergencies See telemetry face sheet for immediately available ER MD   Medication changes reported     No   Fall or balance concerns reported    No   Warm-up and Cool-down Performed on first and last piece of equipment   VAD Patient? No   Pain Assessment   Currently in Pain? No/denies   Multiple Pain Sites No         Goals Met:  Independence with exercise equipment Exercise tolerated well No report of cardiac concerns or symptoms Strength training completed today  Goals Unmet:  Not Applicable  Goals Comments:    Dr. Emily Filbert is Medical Director for [facility].

## 2015-02-25 DIAGNOSIS — I214 Non-ST elevation (NSTEMI) myocardial infarction: Secondary | ICD-10-CM | POA: Diagnosis not present

## 2015-02-25 DIAGNOSIS — Z9889 Other specified postprocedural states: Secondary | ICD-10-CM | POA: Diagnosis not present

## 2015-02-25 NOTE — Progress Notes (Signed)
Cardiac Individual Treatment Plan  Patient Details  Name: Victor Wise MRN: [id number] Date of Birth: 05/30/1950 Referring Provider:  Corey Skains, MD  Initial Encounter Date:    Visit Diagnosis: NSTEMI (non-ST elevated myocardial infarction) [facility])  Patient's Home Medications on Admission:  Current outpatient prescriptions:  .  amLODipine (NORVASC) 10 MG tablet, Take 10 mg by mouth daily., Disp: , Rfl:  .  aspirin EC 81 MG tablet, Take 81 mg by mouth daily., Disp: , Rfl:  .  aspirin-acetaminophen-caffeine (EXCEDRIN MIGRAINE) 250-250-65 MG per tablet, Take 2 tablets by mouth every 6 (six) hours as needed for headache., Disp: , Rfl:  .  benazepril (LOTENSIN) 40 MG tablet, Take 40 mg by mouth 2 (two) times daily. , Disp: , Rfl:  .  buPROPion (WELLBUTRIN XL) 150 MG 24 hr tablet, Take 150 mg by mouth 2 (two) times daily., Disp: , Rfl:  .  busPIRone (BUSPAR) 5 MG tablet, Take by mouth., Disp: , Rfl:  .  cetirizine (ZYRTEC) 10 MG tablet, Take 10 mg by mouth daily., Disp: , Rfl:  .  clopidogrel (PLAVIX) 75 MG tablet, Take 75 mg by mouth daily., Disp: , Rfl:  .  DULoxetine (CYMBALTA) 30 MG capsule, Take 30-60 mg by mouth 2 (two) times daily. 60 mg every morning and 30 mg every evening., Disp: , Rfl:  .  DULoxetine (CYMBALTA) 30 MG capsule, Take 90 mg by mouth daily., Disp: , Rfl:  .  fenofibrate 54 MG tablet, Take 54 mg by mouth every evening., Disp: , Rfl:  .  fexofenadine (ALLEGRA) 180 MG tablet, Take 180 mg by mouth daily., Disp: , Rfl:  .  fluticasone (FLONASE) 50 MCG/ACT nasal spray, Place 2 sprays into both nostrils daily., Disp: , Rfl:  .  glipiZIDE-metformin (METAGLIP) 5-500 MG per tablet, Take 2 tablets by mouth 2 (two) times daily., Disp: , Rfl:  .  hydrochlorothiazide (MICROZIDE) 12.5 MG capsule, Take 12.5 mg by mouth daily., Disp: , Rfl:  .  insulin aspart (NOVOLOG FLEXPEN) 100 UNIT/ML FlexPen, Inject 0-15 Units into the skin 3 (three) times daily with meals as needed for high  blood sugar. , Disp: , Rfl:  .  Insulin Glargine (LANTUS SOLOSTAR) 100 UNIT/ML Solostar Pen, Inject 41 Units into the skin at bedtime., Disp: , Rfl:  .  levofloxacin (LEVAQUIN) 750 MG tablet, Take 1 tablet (750 mg total) by mouth daily. (Patient not taking: Reported on 01/20/2015), Disp: 3 tablet, Rfl: 0 .  Lido-Capsaicin-Men-Methyl Sal (MEDI-PATCH-LIDOCAINE EX), Apply topically., Disp: , Rfl:  .  Liraglutide (VICTOZA) 18 MG/3ML SOPN, Inject 0.6 mg into the skin every evening. , Disp: , Rfl:  .  metaxalone (SKELAXIN) 800 MG tablet, Take 800 mg by mouth 3 (three) times daily. Take half tab orally prn, Disp: , Rfl:  .  metoprolol tartrate (LOPRESSOR) 25 MG tablet, Take 0.5 tablets (12.5 mg total) by mouth 2 (two) times daily., Disp: 30 tablet, Rfl: 1 .  mometasone (NASONEX) 50 MCG/ACT nasal spray, Place 2 sprays into the nose daily., Disp: , Rfl:  .  montelukast (SINGULAIR) 10 MG tablet, Take 10 mg by mouth daily., Disp: , Rfl:  .  nitroGLYCERIN (NITROSTAT) 0.4 MG SL tablet, Place 1 tablet (0.4 mg total) under the tongue every 5 (five) minutes x 3 doses as needed for chest pain., Disp: 25 tablet, Rfl: 1 .  Omega-3 Krill Oil 300 MG CAPS, Take 1 capsule by mouth daily., Disp: , Rfl:  .  Oxcarbazepine (TRILEPTAL) 300 MG tablet, Take  300 mg by mouth 2 (two) times daily. , Disp: , Rfl:  .  oxyCODONE (OXY IR/ROXICODONE) 5 MG immediate release tablet, Take 5 mg by mouth 3 (three) times daily as needed for moderate pain. , Disp: , Rfl:  .  OxyCODONE (OXYCONTIN) 20 mg T12A 12 hr tablet, Take 20 mg by mouth 3 (three) times daily., Disp: , Rfl:  .  pantoprazole (PROTONIX) 40 MG tablet, Take 40 mg by mouth daily., Disp: , Rfl:  .  pregabalin (LYRICA) 200 MG capsule, Take 200 mg by mouth 3 (three) times daily., Disp: , Rfl:  .  tamsulosin (FLOMAX) 0.4 MG CAPS capsule, Take 0.4 mg by mouth daily., Disp: , Rfl:   Past Medical History: Past Medical History  Diagnosis Date  . Diabetes   . Hypertension   . BPH  (benign prostatic hyperplasia)   . Osteomyelitis of foot   . Morbid obesity   . Obstructive sleep apnea   . Chronic back pain   . Depression   . UTI (lower urinary tract infection)     Tobacco Use: History  Smoking status  . Former Smoker  . Types: Cigarettes  Smokeless tobacco  . Not on file    Labs: Recent Review Flowsheet Data    Labs for ITP Cardiac and Pulmonary Rehab Latest Ref Rng 08/30/2013 12/19/2014 12/20/2014   Cholestrol 0 - 200 mg/dL 180 - 147   LDLCALC 0 - 99 mg/dL SEE COMMENT - UNABLE TO CALCULATE IF TRIGLYCERIDE OVER 400 mg/dL   HDL >40 mg/dL 27(L) - 14(L)   Trlycerides <150 mg/dL 465(H) - 512(H)   Hemoglobin A1c 4.8 - 5.6 % - 7.8(H) -       Exercise Target Goals:    Exercise Program Goal: Individual exercise prescription set with THRR, safety & activity barriers. Participant demonstrates ability to understand and report RPE using BORG scale, to self-measure pulse accurately, and to acknowledge the importance of the exercise prescription.  Exercise Prescription Goal: Starting with aerobic activity 30 plus minutes a day, 3 days per week for initial exercise prescription. Provide home exercise prescription and guidelines that participant acknowledges understanding prior to discharge.  Activity Barriers & Risk Stratification:     Activity Barriers & Risk Stratification - 01/20/15 1754    Activity Barriers & Risk Stratification   Activity Barriers Balance Concerns;History of Falls;Neck/Spine Problems;Back Problems;Deconditioning;Muscular Weakness;Assistive Device;Joint Problems      6 Minute Walk:     6 Minute Walk      01/20/15 1513       6 Minute Walk   Phase Initial     Distance 470 feet     Walk Time 4.5 minutes     Resting HR 75 bpm     Resting BP 100/60 mmHg     Max Ex. HR 113 bpm     Max Ex. BP 132/60 mmHg     RPE 17     Symptoms No        Initial Exercise Prescription:     Initial Exercise Prescription - 01/20/15 1500    Date of  Initial Exercise Prescription   Date 01/20/15   Treadmill   MPH 0.8  Use intervals to complete time goals   Grade 0   Minutes 5   Bike   Level 0.2   Minutes 10   Recumbant Bike   Level 2   RPM 40   Watts 20   Minutes 15   NuStep   Level 2   Watts 30  Minutes 10   Arm Ergometer   Level 1   Watts 8   Minutes 10   Arm/Foot Ergometer   Level 4   Watts 12   Minutes 10   Cybex   Level 1   RPM 50   Minutes 10   Recumbant Elliptical   Level 1   RPM 40   Watts 10   Minutes 10   Elliptical   Level 1   Speed 3   Minutes 1   REL-XR   Level 1   Watts 25   Minutes 10   Prescription Details   Frequency (times per week) 3   Duration Progress to 30 minutes of continuous aerobic without signs/symptoms of physical distress   Intensity   THRR REST +  30   Ratings of Perceived Exertion 11-15   Progression Continue progressive overload as per policy without signs/symptoms or physical distress.   Resistance Training   Training Prescription Yes   Weight 2   Reps 10-15      Exercise Prescription Changes:     Exercise Prescription Changes      01/27/15 0600 02/04/15 0800 02/11/15 0900 02/17/15 0700     Exercise Review   Progression Yes Yes Yes Yes    Response to Exercise   Blood Pressure (Admit) 148/82 mmHg   148/80 mmHg    Blood Pressure (Exercise) 146/82 mmHg   144/80 mmHg    Blood Pressure (Exit) 136/84 mmHg   140/88 mmHg    Heart Rate (Admit) 67 bpm   77 bpm    Heart Rate (Exercise) 67 bpm   78 bpm    Heart Rate (Exit) 65 bpm   60 bpm    Rating of Perceived Exertion (Exercise) 11   10    Symptoms No   Some soreness from exercise.    Comments    Prentis was having some soreness from the NuStep so his level was dropped back and his time was increased. He said this has helped.     Duration Progress to 30 minutes of continuous aerobic without signs/symptoms of physical distress Progress to 30 minutes of continuous aerobic without signs/symptoms of physical distress  Progress to 30 minutes of continuous aerobic without signs/symptoms of physical distress Progress to 30 minutes of continuous aerobic without signs/symptoms of physical distress    Intensity Rest + 30 Rest + 30 Rest + 30 Rest + 30    Progression Continue progressive overload as per policy without signs/symptoms or physical distress. Continue progressive overload as per policy without signs/symptoms or physical distress. Continue progressive overload as per policy without signs/symptoms or physical distress. Continue progressive overload as per policy without signs/symptoms or physical distress.    Resistance Training   Training Prescription Yes Yes Yes Yes    Weight 2 2 3 3     Reps 10-15 10-15 10-15 10-15    Interval Training   Interval Training No No No No    NuStep   Level 2 2 6 3     Watts 15 15 40 20    Minutes 20 20 20 20     Recumbant Elliptical   Level  1 4  Bio Step 4  Bio Step    RPM  40 40 40    Watts  10 25 25     Minutes  10 10 10        Discharge Exercise Prescription (Final Exercise Prescription Changes):     Exercise Prescription Changes - 02/17/15 0700  Exercise Review   Progression Yes   Response to Exercise   Blood Pressure (Admit) 148/80 mmHg   Blood Pressure (Exercise) 144/80 mmHg   Blood Pressure (Exit) 140/88 mmHg   Heart Rate (Admit) 77 bpm   Heart Rate (Exercise) 78 bpm   Heart Rate (Exit) 60 bpm   Rating of Perceived Exertion (Exercise) 10   Symptoms Some soreness from exercise.   Comments Taaj was having some soreness from the NuStep so his level was dropped back and his time was increased. He said this has helped.    Duration Progress to 30 minutes of continuous aerobic without signs/symptoms of physical distress   Intensity Rest + 30   Progression Continue progressive overload as per policy without signs/symptoms or physical distress.   Resistance Training   Training Prescription Yes   Weight 3   Reps 10-15   Interval Training   Interval  Training No   NuStep   Level 3   Watts 20   Minutes 20   Recumbant Elliptical   Level 4  Bio Step   RPM 40   Watts 25   Minutes 10      Nutrition:  Target Goals: Understanding of nutrition guidelines, daily intake of sodium <1536m, cholesterol <2081m calories 30% from fat and 7% or less from saturated fats, daily to have 5 or more servings of fruits and vegetables.  Biometrics:     Pre Biometrics - 01/20/15 1451    Pre Biometrics   Height 5' 8.5" (1.74 m)   Weight (!) 307 lb 6.4 oz (139.436 kg)   Waist Circumference 58.25 inches   Hip Circumference 59 inches   Waist to Hip Ratio 0.99 %   BMI (Calculated) 46.2       Nutrition Therapy Plan and Nutrition Goals:     Nutrition Therapy & Goals - 01/30/15 1635    Nutrition Therapy   Diet Instructed on a meal plan based on 1800 calories including dietary guidelines for diabetes and heart health.   Fiber 30 grams   Whole Grain Foods 3 servings   Protein 8 ounces/day   Saturated Fats 12 max. grams   Fruits and Vegetables 5 servings/day   Personal Nutrition Goals   Personal Goal #1 Include at least 2 servings of carbohydrate per meal with range of 2-4 servings/meal.   Personal Goal #2 Balance meals with protein, 2-4 servings of carbohydrate and "free vegetables"   Personal Goal #3 Space 3 meals/day 4-5 hours apart   Comments At present, patient does not have a consistant meal plan and typically averages 2 meals per day + snacks.      Nutrition Discharge: Rate Your Plate Scores:     Rate Your Plate - [id number]/2663785  Rate Your Plate Scores   Pre Score 74   Pre Score % 82 %      Nutrition Goals Re-Evaluation:     Nutrition Goals Re-Evaluation      02/25/15 1140           Personal Goal #3 Re-Evaluation   Personal Goal #3 DaYousifs trying to do this.        Goal Progress Seen Yes          Psychosocial: Target Goals: Acknowledge presence or absence of depression, maximize coping skills, provide positive  support system. Participant is able to verbalize types and ability to use techniques and skills needed for reducing stress and depression.  Initial Review & Psychosocial Screening:  Initial Psych Review & Screening - 01/20/15 1746    Initial Review   Current issues with Current Depression;History of Depression;Current Psychotropic Meds;Current Sleep Concerns   Family Dynamics   Good Support System? Yes   Comments Patient states he lives with his sister Kerry Dory, "who is opinionated and has red hair."  His sister Jeani Hawking and his daughter [name] are his main supporters.  He would like to get stronger and try to move into senior housing.  Divorced, has one daughter and one grand-daughter.  See PHQ-9 score.     Barriers   Psychosocial barriers to participate in program The patient should benefit from training in stress management and relaxation.  Pt has a PHQ-9 score of 24.     Screening Interventions   Interventions Program counselor consult      Quality of Life Scores:     Quality of Life - 01/20/15 1752    Quality of Life Scores   Health/Function Pre 6.23 %   Socioeconomic Pre 10.5 %   Psych/Spiritual Pre 5.79 %   Family Pre 5.9 %   GLOBAL Pre 6.97 %      PHQ-9:     Recent Review Flowsheet Data    Depression screen [facility] 2/9 01/20/2015   Decreased Interest 3   Down, Depressed, Hopeless 3   PHQ - 2 Score 6   Altered sleeping 3   Tired, decreased energy 3   Change in appetite 3   Feeling bad or failure about yourself  3   Trouble concentrating 3   Moving slowly or fidgety/restless 3   Suicidal thoughts 0   PHQ-9 Score 24   Difficult doing work/chores Extremely dIfficult      Psychosocial Evaluation and Intervention:     Psychosocial Evaluation - 01/26/15 1017    Psychosocial Evaluation & Interventions   Interventions Stress management education;Relaxation education;Encouraged to exercise with the program and follow exercise prescription   Comments Counselor met  with Mr. Mcalexander today for initial psychosocial evaluation.  He is a 64 year old who reports having multiple health issues, including more recently a heart attack in August.  He has a limited support system as he lives with his sister and brother-in-law and has a daughter close by.  Mr. Cates reports sleeping "odd hours" with help of a CPAP.  He admits to a history of "clinical depression" and is currently on medications for this and for anxiety.  His PHQ-9 scores were significantly high so counselor questioned efficacy of his medications, with Mr. Viona Gilmore stating they are not very effective.  He admits to multiple stressors, including his health, his living arrangements with his sister and brother-in-law and finances.  His mood was depressed and he engaged in a great deal of negative self-talk, denying suicidal ideations currently.  Therapist recommended seeing a therapist and Mr. Viona Gilmore states he "cannot afford the copay."  Counselor will research local programs to meet this need and educated Mr. Viona Gilmore on the benefit of medications and psychotherapy for clinical depression.  Counselor will follow up regularly with Mr. Viona Gilmore.   Continued Psychosocial Services Needed Yes  Mr. W will benefit from psychotherapy for his clinical depression.  He also will benefit from the psychoeducational components of this program, and meeting with the dietician to address weight loss goals.       Psychosocial Re-Evaluation:     Psychosocial Re-Evaluation      02/18/15 1033 02/25/15 1141         Psychosocial Re-Evaluation  Comments Counselor provided Mr. Birman with information recently on psychiatrists and therapists locally who are affordable.  He continues to engage in negative self-talk, although today he was more positive, especially when talking about spending time with his granddaughters.  Mr. Viona Gilmore will benefit from med eval for efficacy and ongoing therapy for his mood.   This was recommended to him by this counselor.  Robertlee has mulitple  stressors incl his sister being in the hospital so he had to miss a few sessions of Cardiac Rehab.       Continued Psychosocial Services Needed  Yes         Vocational Rehabilitation: Provide vocational rehab assistance to qualifying candidates.   Vocational Rehab Evaluation & Intervention:     Vocational Rehab - 01/20/15 1737    Initial Vocational Rehab Evaluation & Intervention   Assessment shows need for Vocational Rehabilitation No      Education: Education Goals: Education classes will be provided on a weekly basis, covering required topics. Participant will state understanding/return demonstration of topics presented.  Learning Barriers/Preferences:     Learning Barriers/Preferences - 01/20/15 1735    Learning Barriers/Preferences   Learning Barriers Hearing   Learning Preferences Written Material      Education Topics: General Nutrition Guidelines/Fats and Fiber: -Group instruction provided by verbal, written material, models and posters to present the general guidelines for heart healthy nutrition. Gives an explanation and review of dietary fats and fiber.          Cardiac Rehab from 02/23/2015 in [facility] Cardiac Rehab   Date  02/23/15   Educator  PI   Instruction Review Code  2- meets goals/outcomes      Controlling Sodium/Reading Food Labels: -Group verbal and written material supporting the discussion of sodium use in heart healthy nutrition. Review and explanation with models, verbal and written materials for utilization of the food label.   Exercise Physiology & Risk Factors: - Group verbal and written instruction with models to review the exercise physiology of the cardiovascular system and associated critical values. Details cardiovascular disease risk factors and the goals associated with each risk factor.   Aerobic Exercise & Resistance Training: - Gives group verbal and written discussion on the health impact of inactivity. On the components of  aerobic and resistive training programs and the benefits of this training and how to safely progress through these programs.   Flexibility, Balance, General Exercise Guidelines: - Provides group verbal and written instruction on the benefits of flexibility and balance training programs. Provides general exercise guidelines with specific guidelines to those with heart or lung disease. Demonstration and skill practice provided.      Cardiac Rehab from 02/23/2015 in [facility] Re[facility]   Date  02/02/15   Educator  [facility]   Instruction Review Code  2- meets goals/outcomes      Stress Management: - Provides group verbal and written instruction about the health risks of elevated stress, cause of high stress, and healthy ways to reduce stress.      Cardiac Rehab from 02/23/2015 in [facility] Cardiac Rehab   Date  02/04/15   Educator  [facility]   Instruction Review Code  2- meets goals/outcomes      Depression: - Provides group verbal and written instruction on the correlation between heart/lung disease and depressed mood, treatment options, and the stigmas associated with seeking treatment.   Anatomy & Physiology of the Heart: - Group verbal and written instruction and models provide basic cardiac anatomy and physiology, with  the coronary electrical and arterial systems. Review of: AMI, Angina, Valve disease, Heart Failure, Cardiac Arrhythmia, Pacemakers, and the ICD.      Cardiac Rehab from 02/23/2015 in [facility] Cardiac Rehab   Date  02/09/15   Educator  [facility]   Instruction Review Code  2- meets goals/outcomes      Cardiac Procedures: - Group verbal and written instruction and models to describe the testing methods done to diagnose heart disease. Reviews the outcomes of the test results. Describes the treatment choices: Medical Management, Angioplasty, or Coronary Bypass Surgery.   Cardiac Medications: - Group verbal and written instruction to review commonly prescribed medications for heart disease.  Reviews the medication, class of the drug, and side effects. Includes the steps to properly store meds and maintain the prescription regimen.   Go Sex-Intimacy & Heart Disease, Get SMART - Goal Setting: - Group verbal and written instruction through game format to discuss heart disease and the return to sexual intimacy. Provides group verbal and written material to discuss and apply goal setting through the application of the S.M.A.R.T. Method.   Other Matters of the Heart: - Provides group verbal, written materials and models to describe Heart Failure, Angina, Valve Disease, and Diabetes in the realm of heart disease. Includes description of the disease process and treatment options available to the cardiac patient.      Cardiac Rehab from 02/23/2015 in [facility] Cardiac Rehab   Date  02/18/15   Educator  [place]   Instruction Review Code  2- meets goals/outcomes      Exercise & Equipment Safety: - Individual verbal instruction and demonstration of equipment use and safety with use of the equipment.      Cardiac Rehab from 02/23/2015 in [facility] Cardiac Rehab   Date  01/20/15   Educator  D. Joya Gaskins, RN   Instruction Review Code  1- partially meets, needs review/practice      Infection Prevention: - Provides verbal and written material to individual with discussion of infection control including proper hand washing and proper equipment cleaning during exercise session.      Cardiac Rehab from 02/23/2015 in [facility] Cardiac Rehab   Date  01/20/15   Educator  D. Joya Gaskins, RN   Instruction Review Code  2- meets goals/outcomes      Falls Prevention: - Provides verbal and written material to individual with discussion of falls prevention and safety.      Cardiac Rehab from 02/23/2015 in [facility] Cardiac Rehab   Date  01/20/15   Educator  D. Joya Gaskins, RN   Instruction Review Code  1- partially meets, needs review/practice      Diabetes: - Individual verbal and written instruction to review signs/symptoms  of diabetes, desired ranges of glucose level fasting, after meals and with exercise. Advice that pre and post exercise glucose checks will be done for 3 sessions at entry of program.      Cardiac Rehab from 02/23/2015 in [facility] Cardiac Rehab   Date  01/20/15   Educator  D. Joya Gaskins, RN   Instruction Review Code  1- partially meets, needs review/practice [Needs to meet wtih dietitian about meal planning]       Knowledge Questionnaire Score:     Knowledge Questionnaire Score - 01/20/15 1735    Knowledge Questionnaire Score   Pre Score 25/28      Personal Goals and Risk Factors at Admission:     Personal Goals and Risk Factors at Admission - 01/20/15 1745    Personal Goals and Risk  Factors on Admission    Weight Management Yes   Intervention Learn and follow the exercise and diet guidelines while in the program. Utilize the nutrition and education classes to help gain knowledge of the diet and exercise expectations in the program   Admit Weight 307 lb 6.4 oz (139.436 kg)   Increase Aerobic Exercise and Physical Activity Yes   Diabetes Yes   Goal Blood glucose control identified by blood glucose values, HgbA1C. Participant verbalizes understanding of the signs/symptoms of hyper/hypo glycemia, proper foot care and importance of medication and nutrition plan for blood glucose control.   Intervention Provide nutrition & aerobic exercise along with prescribed medications to achieve blood glucose in normal ranges: Fasting 65-99 mg/dL   Hypertension Yes   Goal Participant will see blood pressure controlled within the values of 140/33m/Hg or within value directed by their physician.   Intervention Provide nutrition & aerobic exercise along with prescribed medications to achieve BP 140/90 or less.   Lipids Yes   Goal Cholesterol controlled with medications as prescribed, with individualized exercise RX and with personalized nutrition plan. Value goals: LDL < 754m HDL > 4050mParticipant states  understanding of desired cholesterol values and following prescriptions.   Intervention Provide nutrition & aerobic exercise along with prescribed medications to achieve LDL <17m54mDL >40mg57mStress Yes   Goal To meet with psychosocial counselor for stress and relaxation information and guidance. To state understanding of performing relaxation techniques and or identifying personal stressors.   Intervention Provide education on types of stress, identifiying stressors, and ways to cope with stress. Provide demonstration and active practice of relaxation techniques.      Personal Goals and Risk Factors Review:      Goals and Risk Factor Review      02/09/15 1020 02/11/15 0857 02/25/15 1141       Weight Management   Goals Progress/Improvement seen Yes Yes      Comments Patient has been aware of sodium intake and has been reading labels and is more aware of how much sodium he is consuming. Current weight 315.9. Patient has increased vegetable intake and making smarter choices when eating at restaurants even though his numbers have not improved much on the scale.      Increase Aerobic Exercise and Physical Activity   Goals Progress/Improvement seen   Yes No;Yes     Comments  plans to walk outside and home and want to walk on the treadmill before he graduates.         Personal Goals Discharge (Final Personal Goals and Risk Factors Review):      Goals and Risk Factor Review - 02/25/15 1141    Increase Aerobic Exercise and Physical Activity   Goals Progress/Improvement seen  No;Yes       Comments: DavidJaysonmulitple stressors incl his sister being in the hospital so he had to miss a few sessions of Cardiac Rehab

## 2015-02-25 NOTE — Addendum Note (Signed)
Addended by: Gerlene Burdock on: 02/25/2015 11:44 AM   Modules accepted: Orders

## 2015-02-25 NOTE — Progress Notes (Signed)
Daily Session Note  Patient Details  Name: RASHI GIULIANI MRN: [id number] Date of Birth: 06-21-1950 Referring Provider:  Corey Skains, MD  Encounter Date: 02/25/2015  Check In:     Session Check In - 02/25/15 0854    Check-In   Staff Present Lestine Box BS, ACSM EP-C, Exercise Physiologist;Renee [place] MS, ACSM CEP Exercise Physiologist;Mary Kellie Shropshire RN   ER physicians immediately available to respond to emergencies See telemetry face sheet for immediately available ER MD   Medication changes reported     No   Fall or balance concerns reported    No   Warm-up and Cool-down Performed on first and last piece of equipment   VAD Patient? No   Pain Assessment   Currently in Pain? No/denies         Goals Met:  Proper associated with RPD/PD & O2 Sat Exercise tolerated well No report of cardiac concerns or symptoms Strength training completed today  Goals Unmet:  Not Applicable  Goals Comments:    Dr. Emily Filbert is Medical Director for [place] and [facility].

## 2015-02-27 DIAGNOSIS — I214 Non-ST elevation (NSTEMI) myocardial infarction: Secondary | ICD-10-CM | POA: Diagnosis not present

## 2015-02-27 DIAGNOSIS — Z9861 Coronary angioplasty status: Secondary | ICD-10-CM

## 2015-02-27 DIAGNOSIS — Z9889 Other specified postprocedural states: Secondary | ICD-10-CM | POA: Diagnosis not present

## 2015-02-27 NOTE — Progress Notes (Signed)
Daily Session Note  Patient Details  Name: Victor Wise MRN: [id number] Date of Birth: [birth year] Referring Provider:  Lavera Guise, MD  Encounter Date: 02/27/2015  Check In:     Session Check In - 02/27/15 0943    Check-In   Staff Present Gerlene Burdock RN, BSN;Renee Dillard [place] MS, ACSM CEP Exercise Physiologist;Other   ER physicians immediately available to respond to emergencies See telemetry face sheet for immediately available ER MD   Medication changes reported     No   Fall or balance concerns reported    No   Warm-up and Cool-down Performed on first and last piece of equipment   VAD Patient? No   Pain Assessment   Currently in Pain? No/denies         Goals Met:  Independence with exercise equipment Exercise tolerated well Personal goals reviewed No report of cardiac concerns or symptoms Strength training completed today  Goals Unmet:  Not Applicable  Goals Comments:    Dr. Emily Filbert is Medical Director for [place] and [facility].

## 2015-03-02 ENCOUNTER — Encounter: Payer: Commercial Managed Care - HMO | Admitting: *Deleted

## 2015-03-02 DIAGNOSIS — Z9889 Other specified postprocedural states: Secondary | ICD-10-CM | POA: Diagnosis not present

## 2015-03-02 DIAGNOSIS — I214 Non-ST elevation (NSTEMI) myocardial infarction: Secondary | ICD-10-CM | POA: Diagnosis not present

## 2015-03-02 DIAGNOSIS — Z9861 Coronary angioplasty status: Secondary | ICD-10-CM

## 2015-03-02 NOTE — Progress Notes (Signed)
Daily Session Note  Patient Details  Name: Victor Wise MRN: [id number] Date of Birth: 1950/06/11 Referring Provider:  Corey Skains, MD  Encounter Date: 03/02/2015  Check In:     Session Check In - 03/02/15 0833    Check-In   Staff Present Candiss Norse MS, ACSM CEP Exercise Physiologist;Susanne Bice RN, BSN, CCRP;Florinda Taflinger Alfonso Patten, ACSM CEP Exercise Physiologist   ER physicians immediately available to respond to emergencies See telemetry face sheet for immediately available ER MD   Medication changes reported     No   Fall or balance concerns reported    No   Warm-up and Cool-down Performed on first and last piece of equipment   VAD Patient? No   Pain Assessment   Currently in Pain? Yes   Pain Score 10-Worst pain ever   Pain Location Back   Pain Descriptors / Indicators Aching   Pain Type Chronic pain   Pain Radiating Towards [place] does have chronic back pain, but stated that today the pain is worse than usual. He is using his lidocain patch to help him deal with the pain. It was recommended that he contact his doctor do discuss this greater intensity of pain.    Pain Onset In the past 7 days  Pain is chronic, but the increase in pain started this past weekend.    Pain Frequency Constant   Aggravating Factors  Walking, sitting too long   Pain Relieving Factors Medications, implanted nerve stimulator at L4-L5   Effect of Pain on Daily Activities Pain affects patient's ADL. Pain meds make him tired. Patient has a history of falls. Stated that his increased back pain almost made him fall this weekend. He is a high fall risk. Adjust patient's exercose workloads down today due to increased pain.    Multiple Pain Sites No         Goals Met:  Independence with exercise equipment Exercise tolerated well No report of cardiac concerns or symptoms Strength training completed today  Goals Unmet:  Not Applicable  Goals Comments:    Dr. Emily Filbert is Medical Director for  [place] and [facility].

## 2015-03-03 ENCOUNTER — Encounter: Payer: Self-pay | Admitting: Emergency Medicine

## 2015-03-03 ENCOUNTER — Emergency Department
Admission: EM | Admit: 2015-03-03 | Discharge: 2015-03-03 | Disposition: A | Discharge status: Home / Self Care | Payer: Commercial Managed Care - HMO | Attending: Emergency Medicine | Admitting: Emergency Medicine

## 2015-03-03 DIAGNOSIS — E119 Type 2 diabetes mellitus without complications: Secondary | ICD-10-CM | POA: Diagnosis not present

## 2015-03-03 DIAGNOSIS — G8929 Other chronic pain: Secondary | ICD-10-CM

## 2015-03-03 DIAGNOSIS — Z87891 Personal history of nicotine dependence: Secondary | ICD-10-CM | POA: Insufficient documentation

## 2015-03-03 DIAGNOSIS — M549 Dorsalgia, unspecified: Secondary | ICD-10-CM

## 2015-03-03 DIAGNOSIS — Z7982 Long term (current) use of aspirin: Secondary | ICD-10-CM | POA: Diagnosis not present

## 2015-03-03 DIAGNOSIS — M545 Low back pain: Secondary | ICD-10-CM | POA: Insufficient documentation

## 2015-03-03 DIAGNOSIS — Z794 Long term (current) use of insulin: Secondary | ICD-10-CM | POA: Diagnosis not present

## 2015-03-03 DIAGNOSIS — I10 Essential (primary) hypertension: Secondary | ICD-10-CM | POA: Insufficient documentation

## 2015-03-03 DIAGNOSIS — Z880 Allergy status to penicillin: Secondary | ICD-10-CM | POA: Insufficient documentation

## 2015-03-03 DIAGNOSIS — Z7951 Long term (current) use of inhaled steroids: Secondary | ICD-10-CM | POA: Insufficient documentation

## 2015-03-03 DIAGNOSIS — F329 Major depressive disorder, single episode, unspecified: Secondary | ICD-10-CM | POA: Diagnosis not present

## 2015-03-03 DIAGNOSIS — Z79899 Other long term (current) drug therapy: Secondary | ICD-10-CM | POA: Insufficient documentation

## 2015-03-03 DIAGNOSIS — N400 Enlarged prostate without lower urinary tract symptoms: Secondary | ICD-10-CM | POA: Insufficient documentation

## 2015-03-03 DIAGNOSIS — E669 Obesity, unspecified: Secondary | ICD-10-CM | POA: Insufficient documentation

## 2015-03-03 LAB — URINALYSIS COMPLETE WITH MICROSCOPIC (ARMC ONLY)
BILIRUBIN URINE: NEGATIVE
Bacteria, UA: NONE SEEN
GLUCOSE, UA: NEGATIVE mg/dL
Hgb urine dipstick: NEGATIVE
KETONES UR: NEGATIVE mg/dL
NITRITE: NEGATIVE
Protein, ur: NEGATIVE mg/dL
Specific Gravity, Urine: 1.026 (ref 1.005–1.030)
pH: 5 (ref 5.0–8.0)

## 2015-03-03 MED ORDER — ORPHENADRINE CITRATE 30 MG/ML IJ SOLN
60.0000 mg | Freq: Two times a day (BID) | INTRAMUSCULAR | Status: DC
  Administered 2015-03-03: 60 mg via INTRAMUSCULAR
  Filled 2015-03-03: qty 2

## 2015-03-03 MED ORDER — HYDROMORPHONE HCL 1 MG/ML IJ SOLN
1.0000 mg | Freq: Once | INTRAMUSCULAR | Status: AC
  Administered 2015-03-03: 1 mg via INTRAMUSCULAR
  Filled 2015-03-03: qty 1

## 2015-03-03 NOTE — ED Notes (Addendum)
While waiting for daughter to pick pt up, pt now requesting to be cathed in order to obtain urine. Pt states "I would rather be safe than sorry I guess"  PA Ron made aware and verbalized understanding, verbal order to cath given at this time.

## 2015-03-03 NOTE — ED Notes (Signed)
ED Medics at bedside at this time, attempting to cath pt.

## 2015-03-03 NOTE — Discharge Instructions (Signed)
Advised patient to continue previous pain medications and follow with his pain management appointment next week.

## 2015-03-03 NOTE — ED Notes (Signed)
This RN spoke to pt's daughter, daughter states will not be able to pick pt up for another hour. This RN spoke to charge RN [place] who verbalized to place pt out in lobby until daughter can pick pt up since has been 5 hours since last meds administered. Pt made aware and verbalized understanding at this time.

## 2015-03-03 NOTE — ED Notes (Signed)
States he is having increased lower back pain for the past 3-4 days  States he has used his rescue pain meds w/o relief.denies any new injury. Has scheduled appt next Monday with pain clinic

## 2015-03-03 NOTE — ED Notes (Addendum)
Pt will be d/c once daughter arrives to pick pt up. Pt made aware and verbalized understanding at this time.

## 2015-03-03 NOTE — ED Notes (Addendum)
Lab called in regards to urine delay,  Will process momentarily.

## 2015-03-03 NOTE — ED Notes (Addendum)
Pt still waiting for ride to arrive at this time, states daughter is on her way. PA Ron made aware, no further orders given. PA Ron Instructed RN to keep pt in room until daughter arrives. Pt made aware and verbalized understanding at this time. No acute distress noted. Pt verbalized no further needs at this time

## 2015-03-03 NOTE — ED Provider Notes (Signed)
[facility] Emergency Department Provider Note  ____________________________________________  Time seen: Approximately 2:40 PM  I have reviewed the triage vital signs and the nursing notes.   HISTORY  Chief Complaint Back Pain    HPI Victor Wise is a 64 y.o. male patient complaining of acute back painfor 3 days. Patient has a history of chronic back pain, patient has a nerve stimulator implanted, he stated pain is not resolved with his rescue narcotic medication. Patient also states that  decreased voiding for the last 2 days. Patient is unsure if this chronic back pain is having acute flareup or caused by kidney stones. Patient denies any radicular component to his pain. Patient denies any bowel dysfunction. Patient is rating his pain as a 10 over 10 describe the sharp.   Past Medical History  Diagnosis Date  . Diabetes ([place])   . Hypertension   . BPH (benign prostatic hyperplasia)   . 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 Morbid obesity ([place])   . Obstructive sleep apnea   . Chronic back pain   . Depression   . UTI (lower urinary tract infection)     Patient Active Problem List   Diagnosis Date Noted  . 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 SIRS (systemic inflammatory response syndrome) ([place]) 12/18/2014    Past Surgical History  Procedure Laterality Date  . Pain stimulator    . Right toe amputation    . Cardiac catheterization N/A 12/19/2014    Procedure: Coronary Stent Intervention;  Surgeon: Charolette Forward, MD;  Location: [place] [facility];  Service: Cardiovascular;  Laterality: N/A;  . Cardiac catheterization Left 12/19/2014    Procedure: Left Heart Cath and Coronary Angiography;  Surgeon: Dionisio Azzan, MD;  Location: [place] [facility];  Service: Cardiovascular;  Laterality: Left;    Current Outpatient Rx  Name  Route  Sig  Dispense  Refill  . amLODipine (NORVASC) 10 MG tablet   Oral   Take 10 mg by  mouth daily.         [name] aspirin EC 81 MG tablet   Oral   Take 81 mg by mouth daily.         [name] aspirin-acetaminophen-caffeine (EXCEDRIN MIGRAINE) 250-250-65 MG per tablet   Oral   Take 2 tablets by mouth every 6 (six) hours as needed for headache.         . benazepril (LOTENSIN) 40 MG tablet   Oral   Take 40 mg by mouth 2 (two) times daily.          [name] buPROPion (WELLBUTRIN XL) 150 MG 24 hr tablet   Oral   Take 150 mg by mouth 2 (two) times daily.         . busPIRone (BUSPAR) 5 MG tablet   Oral   Take by mouth.         . cetirizine (ZYRTEC) 10 MG tablet   Oral   Take 10 mg by mouth daily.         . clopidogrel (PLAVIX) 75 MG tablet   Oral   Take 75 mg by mouth daily.         . DULoxetine (CYMBALTA) 30 MG capsule   Oral   Take 30-60 mg by mouth 2 (two) times daily. 60 mg every morning and 30 mg every evening.         . DULoxetine (CYMBALTA) 30 MG capsule   Oral   Take  90 mg by mouth daily.         . fenofibrate 54 MG tablet   Oral   Take 54 mg by mouth every evening.         . fexofenadine (ALLEGRA) 180 MG tablet   Oral   Take 180 mg by mouth daily.         . fluticasone (FLONASE) 50 MCG/ACT nasal spray   Each Nare   Place 2 sprays into both nostrils daily.         [name] glipiZIDE-metformin (METAGLIP) 5-500 MG per tablet   Oral   Take 2 tablets by mouth 2 (two) times daily.         . hydrochlorothiazide (MICROZIDE) 12.5 MG capsule   Oral   Take 12.5 mg by mouth daily.         . insulin aspart (NOVOLOG FLEXPEN) 100 UNIT/ML FlexPen   Subcutaneous   Inject 0-15 Units into the skin 3 (three) times daily with meals as needed for high blood sugar.          . Insulin Glargine (LANTUS SOLOSTAR) 100 UNIT/ML Solostar Pen   Subcutaneous   Inject 41 Units into the skin at bedtime.         [name] levofloxacin (LEVAQUIN) 750 MG tablet   Oral   Take 1 tablet (750 mg total) by mouth daily. Patient not taking: Reported on 01/20/2015   3 tablet    0   . Lido-Capsaicin-Men-Methyl Sal (MEDI-PATCH-LIDOCAINE EX)   Apply externally   Apply topically.         . Liraglutide (VICTOZA) 18 MG/3ML SOPN   Subcutaneous   Inject 0.6 mg into the skin every evening.          . metaxalone (SKELAXIN) 800 MG tablet   Oral   Take 800 mg by mouth 3 (three) times daily. Take half tab orally prn         . metoprolol tartrate (LOPRESSOR) 25 MG tablet   Oral   Take 0.5 tablets (12.5 mg total) by mouth 2 (two) times daily.   30 tablet   1   . mometasone (NASONEX) 50 MCG/ACT nasal spray   Nasal   Place 2 sprays into the nose daily.         . montelukast (SINGULAIR) 10 MG tablet   Oral   Take 10 mg by mouth daily.         . nitroGLYCERIN (NITROSTAT) 0.4 MG SL tablet   Sublingual   Place 1 tablet (0.4 mg total) under the tongue every 5 (five) minutes x 3 doses as needed for chest pain.   25 tablet   1   . Omega-3 Krill Oil 300 MG CAPS   Oral   Take 1 capsule by mouth daily.         . Oxcarbazepine (TRILEPTAL) 300 MG tablet   Oral   Take 300 mg by mouth 2 (two) times daily.          [name] oxyCODONE (OXY IR/ROXICODONE) 5 MG immediate release tablet   Oral   Take 5 mg by mouth 3 (three) times daily as needed for moderate pain.          . OxyCODONE (OXYCONTIN) 20 mg T12A 12 hr tablet   Oral   Take 20 mg by mouth 3 (three) times daily.         . pantoprazole (PROTONIX) 40 MG tablet   Oral   Take 40 mg by mouth daily.         [name]  pregabalin (LYRICA) 200 MG capsule   Oral   Take 200 mg by mouth 3 (three) times daily.         . tamsulosin (FLOMAX) 0.4 MG CAPS capsule   Oral   Take 0.4 mg by mouth daily.           Allergies Amoxicillin; Other; Hydralazine; Crestor; Rosuvastatin; Sulfa antibiotics; Yellow dyes (non-tartrazine); Aspirin; Penicillins; and Tape  Family History  Problem Relation Age of Onset  . Breast cancer Mother   . Lung cancer Father     Social History Social History  Substance Use Topics   . Smoking status: Former Smoker    Types: Cigarettes  . Smokeless tobacco: None  . Alcohol Use: No    Review of Systems Constitutional: No fever/chills Eyes: No visual changes. ENT: No sore throat. Cardiovascular: Denies chest pain. Respiratory: Denies shortness of breath. Gastrointestinal: No abdominal pain.  No nausea, no vomiting.  No diarrhea.  No constipation. Genitourinary: Decreased voiding. Musculoskeletal: Acute back pain with history of chronic back pain. Skin: Negative for rash. Neurological: Negative for headaches, focal weakness or numbness. Psychiatric:Depression Endocrine: Motor obesity, diabetes, benign prostatic hyperplasia,  and hypertension. Hematological/Lymphatic: Allergic/Immunilogical: See medication list  10-point ROS otherwise negative.  ____________________________________________   PHYSICAL EXAM:  VITAL SIGNS: ED Triage Vitals  Enc Vitals Group     BP 03/03/15 1419 97/57 mmHg     Pulse Rate 03/03/15 1419 63     Resp 03/03/15 1419 20     Temp 03/03/15 1419 97.8 F (36.6 C)     Temp Source 03/03/15 1419 Oral     SpO2 03/03/15 1419 98 %     Weight 03/03/15 1419 307 lb (139.254 kg)     Height --      Head Cir --      Peak Flow --      Pain Score 03/03/15 1419 10     Pain Loc --      Pain Edu? --      Excl. in [place]? --     Constitutional: Alert and oriented morbid obesity moderate distress. Eyes: Conjunctivae are normal. PERRL. EOMI. Head: Atraumatic. Nose: No congestion/rhinnorhea. Mouth/Throat: Mucous membranes are moist.  Oropharynx non-erythematous. Neck: No stridor.  No cervical spine tenderness to palpation. Hematological/Lymphatic/Immunilogical: No cervical lymphadenopathy. Cardiovascular: Normal rate, regular rhythm. Grossly normal heart sounds.  Good peripheral circulation. Respiratory: Normal respiratory effort.  No retractions. Lungs CTAB. Gastrointestinal: Soft and nontender. No distention. No abdominal bruits. No CVA  tenderness. Genitourinary:  Musculoskeletal: No spinal deformity. Surgical scars consistent with history. Tender palpation L4-S1. Neurologic:  Normal speech and language. No gross focal neurologic deficits are appreciated. No gait instability. Skin:  Skin is warm, dry and intact. No rash noted. Psychiatric: Mood and affect are normal. Speech and behavior are normal.  ____________________________________________   LABS (all labs ordered are listed, but only abnormal results are displayed)  Labs Reviewed  URINALYSIS COMPLETEWITH MICROSCOPIC ([place])   ____________________________________________  EKG   ____________________________________________  RADIOLOGY   ____________________________________________   PROCEDURES  Procedure(s) performed: None  Critical Care performed: No  ____________________________________________   INITIAL IMPRESSION / ASSESSMENT AND PLAN / ED COURSE  Pertinent labs & imaging results that were available during my care of the patient were reviewed by me and considered in my medical decision making (see chart for details).  Acute low back pain. Patient state pain is  manageable status post Dilaudid and Norflex. Patient has been forcing fluids for 3 hours and still  unable to void. Patient refused urinary catheter. Patient believe his BPH is preventing him from voiding at this time. Patient will be discharged and advised to continue his home medications follow-up with scheduled pain management appointment next week. Return by ER physician condition worsens. ____________________________________________   FINAL CLINICAL IMPRESSION(S) / ED DIAGNOSES  Final diagnoses:  Chronic back pain greater than 3 months duration      Sable Feil, PA-C 03/03/15 1743  Daymon Larsen, MD 03/03/15 1754

## 2015-03-03 NOTE — ED Notes (Addendum)
Pt to ed with c/o bilat lower back pain x 4 days.  Denies injury but states pain is worse with movement and activity.  Pt also reports low flow of urination since pain started. Pt states pain changes from right to left and then back again.  Pt states pain was severe last night and he was unable to get comfortable. Pt with hx of spinal stimulator.  Pt also reports hx of chronic low back pain and is a pain pt at [facility].

## 2015-03-03 NOTE — ED Notes (Addendum)
Pt ambulatory to bathroom at this time with no concerns. Pt unable to urinate at this time to obtain sample, states "I have a neurogenic bladder so I can never pee right, I can drink gallons of water and never pee after" PA Ron made aware, PA Ron states to urine cath pt, but pt refusing, stating, "I've had that done before and I did not like it, I don't want to go through with that again" PA Ron verbalized understanding, no further orders given. Pt given three cups of water at this time to facilitate in obtaining urine sample on own.

## 2015-03-04 ENCOUNTER — Telehealth: Payer: Self-pay

## 2015-03-04 NOTE — Telephone Encounter (Signed)
Victor Wise called and stated that he has a kidney stone and would not be in class today.

## 2015-03-10 DIAGNOSIS — I25119 Atherosclerotic heart disease of native coronary artery with unspecified angina pectoris: Secondary | ICD-10-CM | POA: Diagnosis not present

## 2015-03-10 DIAGNOSIS — N390 Urinary tract infection, site not specified: Secondary | ICD-10-CM | POA: Diagnosis not present

## 2015-03-10 DIAGNOSIS — I10 Essential (primary) hypertension: Secondary | ICD-10-CM | POA: Diagnosis not present

## 2015-03-10 DIAGNOSIS — E1151 Type 2 diabetes mellitus with diabetic peripheral angiopathy without gangrene: Secondary | ICD-10-CM | POA: Diagnosis not present

## 2015-03-10 DIAGNOSIS — N200 Calculus of kidney: Secondary | ICD-10-CM | POA: Diagnosis not present

## 2015-03-11 ENCOUNTER — Telehealth: Payer: Self-pay | Admitting: *Deleted

## 2015-03-11 NOTE — Telephone Encounter (Signed)
Victor Wise call ed to let us know he may have a kidney stone and has a scan scheduled for tomorrow. He will be out until this problem is resolved.

## 2015-03-12 ENCOUNTER — Encounter: Payer: Self-pay | Admitting: *Deleted

## 2015-03-12 DIAGNOSIS — R109 Unspecified abdominal pain: Secondary | ICD-10-CM | POA: Diagnosis not present

## 2015-03-12 DIAGNOSIS — K439 Ventral hernia without obstruction or gangrene: Secondary | ICD-10-CM | POA: Diagnosis not present

## 2015-03-12 DIAGNOSIS — I214 Non-ST elevation (NSTEMI) myocardial infarction: Secondary | ICD-10-CM

## 2015-03-12 DIAGNOSIS — K760 Fatty (change of) liver, not elsewhere classified: Secondary | ICD-10-CM | POA: Diagnosis not present

## 2015-03-12 DIAGNOSIS — K429 Umbilical hernia without obstruction or gangrene: Secondary | ICD-10-CM | POA: Diagnosis not present

## 2015-03-12 DIAGNOSIS — I2510 Atherosclerotic heart disease of native coronary artery without angina pectoris: Secondary | ICD-10-CM | POA: Diagnosis not present

## 2015-03-12 NOTE — Progress Notes (Signed)
Cardiac Individual Treatment Plan  Patient Details  Name: Victor Wise MRN: [id number] Date of Birth: January 27, 1951 Referring Provider:  No ref. provider found  Initial Encounter Date:  01/20/2015  Visit Diagnosis: No diagnosis found.  Patient's Home Medications on Admission:  Current outpatient prescriptions:  .  amLODipine (NORVASC) 10 MG tablet, Take 10 mg by mouth daily., Disp: , Rfl:  .  aspirin EC 81 MG tablet, Take 81 mg by mouth daily., Disp: , Rfl:  .  aspirin-acetaminophen-caffeine (EXCEDRIN MIGRAINE) 250-250-65 MG per tablet, Take 2 tablets by mouth every 6 (six) hours as needed for headache., Disp: , Rfl:  .  benazepril (LOTENSIN) 40 MG tablet, Take 40 mg by mouth 2 (two) times daily. , Disp: , Rfl:  .  buPROPion (WELLBUTRIN XL) 150 MG 24 hr tablet, Take 150 mg by mouth 2 (two) times daily., Disp: , Rfl:  .  busPIRone (BUSPAR) 5 MG tablet, Take by mouth., Disp: , Rfl:  .  cetirizine (ZYRTEC) 10 MG tablet, Take 10 mg by mouth daily., Disp: , Rfl:  .  clopidogrel (PLAVIX) 75 MG tablet, Take 75 mg by mouth daily., Disp: , Rfl:  .  DULoxetine (CYMBALTA) 30 MG capsule, Take 30-60 mg by mouth 2 (two) times daily. 60 mg every morning and 30 mg every evening., Disp: , Rfl:  .  DULoxetine (CYMBALTA) 30 MG capsule, Take 90 mg by mouth daily., Disp: , Rfl:  .  fenofibrate 54 MG tablet, Take 54 mg by mouth every evening., Disp: , Rfl:  .  fexofenadine (ALLEGRA) 180 MG tablet, Take 180 mg by mouth daily., Disp: , Rfl:  .  fluticasone (FLONASE) 50 MCG/ACT nasal spray, Place 2 sprays into both nostrils daily., Disp: , Rfl:  .  glipiZIDE-metformin (METAGLIP) 5-500 MG per tablet, Take 2 tablets by mouth 2 (two) times daily., Disp: , Rfl:  .  hydrochlorothiazide (MICROZIDE) 12.5 MG capsule, Take 12.5 mg by mouth daily., Disp: , Rfl:  .  insulin aspart (NOVOLOG FLEXPEN) 100 UNIT/ML FlexPen, Inject 0-15 Units into the skin 3 (three) times daily with meals as needed for high blood sugar. , Disp: , Rfl:   .  Insulin Glargine (LANTUS SOLOSTAR) 100 UNIT/ML Solostar Pen, Inject 41 Units into the skin at bedtime., Disp: , Rfl:  .  levofloxacin (LEVAQUIN) 750 MG tablet, Take 1 tablet (750 mg total) by mouth daily. (Patient not taking: Reported on 01/20/2015), Disp: 3 tablet, Rfl: 0 .  Lido-Capsaicin-Men-Methyl Sal (MEDI-PATCH-LIDOCAINE EX), Apply topically., Disp: , Rfl:  .  Liraglutide (VICTOZA) 18 MG/3ML SOPN, Inject 0.6 mg into the skin every evening. , Disp: , Rfl:  .  metaxalone (SKELAXIN) 800 MG tablet, Take 800 mg by mouth 3 (three) times daily. Take half tab orally prn, Disp: , Rfl:  .  metoprolol tartrate (LOPRESSOR) 25 MG tablet, Take 0.5 tablets (12.5 mg total) by mouth 2 (two) times daily., Disp: 30 tablet, Rfl: 1 .  mometasone (NASONEX) 50 MCG/ACT nasal spray, Place 2 sprays into the nose daily., Disp: , Rfl:  .  montelukast (SINGULAIR) 10 MG tablet, Take 10 mg by mouth daily., Disp: , Rfl:  .  nitroGLYCERIN (NITROSTAT) 0.4 MG SL tablet, Place 1 tablet (0.4 mg total) under the tongue every 5 (five) minutes x 3 doses as needed for chest pain., Disp: 25 tablet, Rfl: 1 .  Omega-3 Krill Oil 300 MG CAPS, Take 1 capsule by mouth daily., Disp: , Rfl:  .  Oxcarbazepine (TRILEPTAL) 300 MG tablet, Take 300 mg by  mouth 2 (two) times daily. , Disp: , Rfl:  .  oxyCODONE (OXY IR/ROXICODONE) 5 MG immediate release tablet, Take 5 mg by mouth 3 (three) times daily as needed for moderate pain. , Disp: , Rfl:  .  OxyCODONE (OXYCONTIN) 20 mg T12A 12 hr tablet, Take 20 mg by mouth 3 (three) times daily., Disp: , Rfl:  .  pantoprazole (PROTONIX) 40 MG tablet, Take 40 mg by mouth daily., Disp: , Rfl:  .  pregabalin (LYRICA) 200 MG capsule, Take 200 mg by mouth 3 (three) times daily., Disp: , Rfl:  .  tamsulosin (FLOMAX) 0.4 MG CAPS capsule, Take 0.4 mg by mouth daily., Disp: , Rfl:   Past Medical History: Past Medical History  Diagnosis Date  . Diabetes ([place])   . Hypertension   . BPH (benign prostatic  hyperplasia)   . 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 Morbid obesity ([place])   . Obstructive sleep apnea   . Chronic back pain   . Depression   . UTI (lower urinary tract infection)     Tobacco Use: History  Smoking status  . Former Smoker  . Types: Cigarettes  Smokeless tobacco  . Not on file    Labs: Recent Review Flowsheet Data    Labs for ITP Cardiac and Pulmonary Rehab Latest Ref Rng 08/30/2013 12/19/2014 12/20/2014   Cholestrol 0 - 200 mg/dL 180 - 147   LDLCALC 0 - 99 mg/dL SEE COMMENT - UNABLE TO CALCULATE IF TRIGLYCERIDE OVER 400 mg/dL   HDL >40 mg/dL 27(L) - 14(L)   Trlycerides <150 mg/dL 465(H) - 512(H)   Hemoglobin A1c 4.8 - 5.6 % - 7.8(H) -       Exercise Target Goals:    Exercise Program Goal: Individual exercise prescription set with THRR, safety & activity barriers. Participant demonstrates ability to understand and report RPE using BORG scale, to self-measure pulse accurately, and to acknowledge the importance of the exercise prescription.  Exercise Prescription Goal: Starting with aerobic activity 30 plus minutes a day, 3 days per week for initial exercise prescription. Provide home exercise prescription and guidelines that participant acknowledges understanding prior to discharge.  Activity Barriers & Risk Stratification:     Activity Barriers & Risk Stratification - 01/20/15 1754    Activity Barriers & Risk Stratification   Activity Barriers Balance Concerns;History of Falls;Neck/Spine Problems;Back Problems;Deconditioning;Muscular Weakness;Assistive Device;Joint Problems      6 Minute Walk:     6 Minute Walk      01/20/15 1513       6 Minute Walk   Phase Initial     Distance 470 feet     Walk Time 4.5 minutes     Resting HR 75 bpm     Resting BP 100/60 mmHg     Max Ex. HR 113 bpm     Max Ex. BP 132/60 mmHg     RPE 17     Symptoms No        Initial Exercise Prescription:     Initial Exercise Prescription - 01/20/15 1500    Date of Initial  Exercise Prescription   Date 01/20/15   Treadmill   MPH 0.8  Use intervals to complete time goals   Grade 0   Minutes 5   Bike   Level 0.2   Minutes 10   Recumbant Bike   Level 2   RPM 40   Watts 20   Minutes 15   NuStep   Level 2   Watts 30  Minutes 10   Arm Ergometer   Level 1   Watts 8   Minutes 10   Arm/Foot Ergometer   Level 4   Watts 12   Minutes 10   Cybex   Level 1   RPM 50   Minutes 10   Recumbant Elliptical   Level 1   RPM 40   Watts 10   Minutes 10   Elliptical   Level 1   Speed 3   Minutes 1   REL-XR   Level 1   Watts 25   Minutes 10   Prescription Details   Frequency (times per week) 3   Duration Progress to 30 minutes of continuous aerobic without signs/symptoms of physical distress   Intensity   THRR REST +  30   Ratings of Perceived Exertion 11-15   Progression Continue progressive overload as per policy without signs/symptoms or physical distress.   Resistance Training   Training Prescription Yes   Weight 2   Reps 10-15      Exercise Prescription Changes:     Exercise Prescription Changes      01/27/15 0600 02/04/15 0800 02/11/15 0900 02/17/15 0700     Exercise Review   Progression Yes Yes Yes Yes    Response to Exercise   Blood Pressure (Admit) 148/82 mmHg   148/80 mmHg    Blood Pressure (Exercise) 146/82 mmHg   144/80 mmHg    Blood Pressure (Exit) 136/84 mmHg   140/88 mmHg    Heart Rate (Admit) 67 bpm   77 bpm    Heart Rate (Exercise) 67 bpm   78 bpm    Heart Rate (Exit) 65 bpm   60 bpm    Rating of Perceived Exertion (Exercise) 11   10    Symptoms No   Some soreness from exercise.    Comments    Whyatt was having some soreness from the NuStep so his level was dropped back and his time was increased. He said this has helped.     Duration Progress to 30 minutes of continuous aerobic without signs/symptoms of physical distress Progress to 30 minutes of continuous aerobic without signs/symptoms of physical distress Progress  to 30 minutes of continuous aerobic without signs/symptoms of physical distress Progress to 30 minutes of continuous aerobic without signs/symptoms of physical distress    Intensity Rest + 30 Rest + 30 Rest + 30 Rest + 30    Progression Continue progressive overload as per policy without signs/symptoms or physical distress. Continue progressive overload as per policy without signs/symptoms or physical distress. Continue progressive overload as per policy without signs/symptoms or physical distress. Continue progressive overload as per policy without signs/symptoms or physical distress.    Resistance Training   Training Prescription Yes Yes Yes Yes    Weight 2 2 3 3     Reps 10-15 10-15 10-15 10-15    Interval Training   Interval Training No No No No    NuStep   Level 2 2 6 3     Watts 15 15 40 20    Minutes 20 20 20 20     Recumbant Elliptical   Level  1 4  Bio Step 4  Bio Step    RPM  40 40 40    Watts  10 25 25     Minutes  10 10 10        Discharge Exercise Prescription (Final Exercise Prescription Changes):     Exercise Prescription Changes - 02/17/15 0700  Exercise Review   Progression Yes   Response to Exercise   Blood Pressure (Admit) 148/80 mmHg   Blood Pressure (Exercise) 144/80 mmHg   Blood Pressure (Exit) 140/88 mmHg   Heart Rate (Admit) 77 bpm   Heart Rate (Exercise) 78 bpm   Heart Rate (Exit) 60 bpm   Rating of Perceived Exertion (Exercise) 10   Symptoms Some soreness from exercise.   Comments Gracin was having some soreness from the NuStep so his level was dropped back and his time was increased. He said this has helped.    Duration Progress to 30 minutes of continuous aerobic without signs/symptoms of physical distress   Intensity Rest + 30   Progression Continue progressive overload as per policy without signs/symptoms or physical distress.   Resistance Training   Training Prescription Yes   Weight 3   Reps 10-15   Interval Training   Interval Training No    NuStep   Level 3   Watts 20   Minutes 20   Recumbant Elliptical   Level 4  Bio Step   RPM 40   Watts 25   Minutes 10      Nutrition:  Target Goals: Understanding of nutrition guidelines, daily intake of sodium <1565m, cholesterol <2056m calories 30% from fat and 7% or less from saturated fats, daily to have 5 or more servings of fruits and vegetables.  Biometrics:     Pre Biometrics - 01/20/15 1451    Pre Biometrics   Height 5' 8.5" (1.74 m)   Weight (!) 307 lb 6.4 oz (139.436 kg)   Waist Circumference 58.25 inches   Hip Circumference 59 inches   Waist to Hip Ratio 0.99 %   BMI (Calculated) 46.2       Nutrition Therapy Plan and Nutrition Goals:     Nutrition Therapy & Goals - 01/30/15 1635    Nutrition Therapy   Diet Instructed on a meal plan based on 1800 calories including dietary guidelines for diabetes and heart health.   Fiber 30 grams   Whole Grain Foods 3 servings   Protein 8 ounces/day   Saturated Fats 12 max. grams   Fruits and Vegetables 5 servings/day   Personal Nutrition Goals   Personal Goal #1 Include at least 2 servings of carbohydrate per meal with range of 2-4 servings/meal.   Personal Goal #2 Balance meals with protein, 2-4 servings of carbohydrate and "free vegetables"   Personal Goal #3 Space 3 meals/day 4-5 hours apart   Comments At present, patient does not have a consistant meal plan and typically averages 2 meals per day + snacks.      Nutrition Discharge: Rate Your Plate Scores:     Rate Your Plate - 0909/47/0966283  Rate Your Plate Scores   Pre Score 74   Pre Score % 82 %      Nutrition Goals Re-Evaluation:     Nutrition Goals Re-Evaluation      02/25/15 1140           Personal Goal #3 Re-Evaluation   Personal Goal #3 DaJaviers trying to do this.        Goal Progress Seen Yes          Psychosocial: Target Goals: Acknowledge presence or absence of depression, maximize coping skills, provide positive support system.  Participant is able to verbalize types and ability to use techniques and skills needed for reducing stress and depression.  Initial Review & Psychosocial Screening:  Initial Psych Review & Screening - 01/20/15 1746    Initial Review   Current issues with Current Depression;History of Depression;Current Psychotropic Meds;Current Sleep Concerns   Family Dynamics   Good Support System? Yes   Comments Patient states he lives with his sister Victor Wise, "who is opinionated and has red hair."  His sister Victor Wise and his daughter Victor Wise are his main supporters.  He would like to get stronger and try to move into senior housing.  Divorced, has one daughter and one grand-daughter.  See PHQ-9 score.     Barriers   Psychosocial barriers to participate in program The patient should benefit from training in stress management and relaxation.  Pt has a PHQ-9 score of 24.     Screening Interventions   Interventions Program counselor consult      Quality of Life Scores:     Quality of Life - 01/20/15 1752    Quality of Life Scores   Health/Function Pre 6.23 %   Socioeconomic Pre 10.5 %   Psych/Spiritual Pre 5.79 %   Family Pre 5.9 %   GLOBAL Pre 6.97 %      PHQ-9:     Recent Review Flowsheet Data    Depression screen [facility] 2/9 01/20/2015   Decreased Interest 3   Down, Depressed, Hopeless 3   PHQ - 2 Score 6   Altered sleeping 3   Tired, decreased energy 3   Change in appetite 3   Feeling bad or failure about yourself  3   Trouble concentrating 3   Moving slowly or fidgety/restless 3   Suicidal thoughts 0   PHQ-9 Score 24   Difficult doing work/chores Extremely dIfficult      Psychosocial Evaluation and Intervention:     Psychosocial Evaluation - 01/26/15 1017    Psychosocial Evaluation & Interventions   Interventions Stress management education;Relaxation education;Encouraged to exercise with the program and follow exercise prescription   Comments Counselor met with Victor Wise  today for initial psychosocial evaluation.  He is a 64 year old who reports having multiple health issues, including more recently a heart attack in August.  He has a limited support system as he lives with his sister and brother-in-law and has a daughter close by.  Victor Wise reports sleeping "odd hours" with help of a CPAP.  He admits to a history of "clinical depression" and is currently on medications for this and for anxiety.  His PHQ-9 scores were significantly high so counselor questioned efficacy of his medications, with Victor Wise stating they are not very effective.  He admits to multiple stressors, including his health, his living arrangements with his sister and brother-in-law and finances.  His mood was depressed and he engaged in a great deal of negative self-talk, denying suicidal ideations currently.  Therapist recommended seeing a therapist and Victor Wise states he "cannot afford the copay."  Counselor will research local programs to meet this need and educated Victor Wise on the benefit of medications and psychotherapy for clinical depression.  Counselor will follow up regularly with Victor Wise.   Continued Psychosocial Services Needed Yes  Victor Wise will benefit from psychotherapy for his clinical depression.  He also will benefit from the psychoeducational components of this program, and meeting with the dietician to address weight loss goals.       Psychosocial Re-Evaluation:     Psychosocial Re-Evaluation      02/18/15 1033 02/25/15 1141         Psychosocial Re-Evaluation  Comments Counselor provided Victor Wise with information recently on psychiatrists and therapists locally who are affordable.  He continues to engage in negative self-talk, although today he was more positive, especially when talking about spending time with his granddaughters.  Victor Wise will benefit from med eval for efficacy and ongoing therapy for his mood.   This was recommended to him by this counselor.  Mckinley has mulitple stressors incl  his sister being in the hospital so he had to miss a few sessions of Cardiac Rehab.       Continued Psychosocial Services Needed  Yes         Vocational Rehabilitation: Provide vocational rehab assistance to qualifying candidates.   Vocational Rehab Evaluation & Intervention:     Vocational Rehab - 01/20/15 1737    Initial Vocational Rehab Evaluation & Intervention   Assessment shows need for Vocational Rehabilitation No      Education: Education Goals: Education classes will be provided on a weekly basis, covering required topics. Participant will state understanding/return demonstration of topics presented.  Learning Barriers/Preferences:     Learning Barriers/Preferences - 01/20/15 1735    Learning Barriers/Preferences   Learning Barriers Hearing   Learning Preferences Written Material      Education Topics: General Nutrition Guidelines/Fats and Fiber: -Group instruction provided by verbal, written material, models and posters to present the general guidelines for heart healthy nutrition. Gives an explanation and review of dietary fats and fiber.          Cardiac Rehab from 03/02/2015 in [facility] Cardiac Rehab   Date  02/23/15   Educator  PI   Instruction Review Code  2- meets goals/outcomes      Controlling Sodium/Reading Food Labels: -Group verbal and written material supporting the discussion of sodium use in heart healthy nutrition. Review and explanation with models, verbal and written materials for utilization of the food label.   Exercise Physiology & Risk Factors: - Group verbal and written instruction with models to review the exercise physiology of the cardiovascular system and associated critical values. Details cardiovascular disease risk factors and the goals associated with each risk factor.   Aerobic Exercise & Resistance Training: - Gives group verbal and written discussion on the health impact of inactivity. On the components of aerobic and resistive  training programs and the benefits of this training and how to safely progress through these programs.   Flexibility, Balance, General Exercise Guidelines: - Provides group verbal and written instruction on the benefits of flexibility and balance training programs. Provides general exercise guidelines with specific guidelines to those with heart or lung disease. Demonstration and skill practice provided.      Cardiac Rehab from 03/02/2015 in [facility] Cardiac Rehab   Date  02/02/15   Educator  [facility]   Instruction Review Code  2- meets goals/outcomes      Stress Management: - Provides group verbal and written instruction about the health risks of elevated stress, cause of high stress, and healthy ways to reduce stress.      Cardiac Rehab from 03/02/2015 in [facility] Cardiac Rehab   Date  02/04/15   Educator  [facility]   Instruction Review Code  2- meets goals/outcomes      Depression: - Provides group verbal and written instruction on the correlation between heart/lung disease and depressed mood, treatment options, and the stigmas associated with seeking treatment.   Anatomy & Physiology of the Heart: - Group verbal and written instruction and models provide basic cardiac anatomy and physiology, with  the coronary electrical and arterial systems. Review of: AMI, Angina, Valve disease, Heart Failure, Cardiac Arrhythmia, Pacemakers, and the ICD.      Cardiac Rehab from 03/02/2015 in [facility] Cardiac Rehab   Date  02/09/15   Educator  SB   Instruction Review Code  2- meets goals/outcomes      Cardiac Procedures: - Group verbal and written instruction and models to describe the testing methods done to diagnose heart disease. Reviews the outcomes of the test results. Describes the treatment choices: Medical Management, Angioplasty, or Coronary Bypass Surgery.      Cardiac Rehab from 03/02/2015 in [facility] Cardiac Rehab   Date  03/02/15   Educator  SB   Instruction Review Code  2- meets goals/outcomes       Cardiac Medications: - Group verbal and written instruction to review commonly prescribed medications for heart disease. Reviews the medication, class of the drug, and side effects. Includes the steps to properly store meds and maintain the prescription regimen.   Go Sex-Intimacy & Heart Disease, Get SMART - Goal Setting: - Group verbal and written instruction through game format to discuss heart disease and the return to sexual intimacy. Provides group verbal and written material to discuss and apply goal setting through the application of the S.M.A.R.T. Method.      Cardiac Rehab from 03/02/2015 in [facility] Cardiac Rehab   Date  03/02/15   Educator  SB   Instruction Review Code  2- meets goals/outcomes      Other Matters of the Heart: - Provides group verbal, written materials and models to describe Heart Failure, Angina, Valve Disease, and Diabetes in the realm of heart disease. Includes description of the disease process and treatment options available to the cardiac patient.      Cardiac Rehab from 03/02/2015 in [facility] Cardiac Rehab   Date  02/18/15   Educator  [place]   Instruction Review Code  2- meets goals/outcomes      Exercise & Equipment Safety: - Individual verbal instruction and demonstration of equipment use and safety with use of the equipment.      Cardiac Rehab from 03/02/2015 in [facility] Cardiac Rehab   Date  01/20/15   Educator  D. Joya Gaskins, RN   Instruction Review Code  1- partially meets, needs review/practice      Infection Prevention: - Provides verbal and written material to individual with discussion of infection control including proper hand washing and proper equipment cleaning during exercise session.      Cardiac Rehab from 03/02/2015 in [facility] Cardiac Rehab   Date  01/20/15   Educator  D. Joya Gaskins, RN   Instruction Review Code  2- meets goals/outcomes      Falls Prevention: - Provides verbal and written material to individual with discussion of falls  prevention and safety.      Cardiac Rehab from 03/02/2015 in [facility] Cardiac Rehab   Date  01/20/15   Educator  D. Joya Gaskins, RN   Instruction Review Code  1- partially meets, needs review/practice      Diabetes: - Individual verbal and written instruction to review signs/symptoms of diabetes, desired ranges of glucose level fasting, after meals and with exercise. Advice that pre and post exercise glucose checks will be done for 3 sessions at entry of program.      Cardiac Rehab from 03/02/2015 in [facility] Cardiac Rehab   Date  01/20/15   Educator  D. Joya Gaskins, RN   Instruction Review Code  1- partially meets, needs review/practice [Needs  to meet wtih dietitian about meal planning]       Knowledge Questionnaire Score:     Knowledge Questionnaire Score - 01/20/15 1735    Knowledge Questionnaire Score   Pre Score 25/28      Personal Goals and Risk Factors at Admission:     Personal Goals and Risk Factors at Admission - 01/20/15 1745    Personal Goals and Risk Factors on Admission    Weight Management Yes   Intervention Learn and follow the exercise and diet guidelines while in the program. Utilize the nutrition and education classes to help gain knowledge of the diet and exercise expectations in the program   Admit Weight 307 lb 6.4 oz (139.436 kg)   Increase Aerobic Exercise and Physical Activity Yes   Diabetes Yes   Goal Blood glucose control identified by blood glucose values, HgbA1C. Participant verbalizes understanding of the signs/symptoms of hyper/hypo glycemia, proper foot care and importance of medication and nutrition plan for blood glucose control.   Intervention Provide nutrition & aerobic exercise along with prescribed medications to achieve blood glucose in normal ranges: Fasting 65-99 mg/dL   Hypertension Yes   Goal Participant will see blood pressure controlled within the values of 140/83m/Hg or within value directed by their physician.   Intervention Provide nutrition &  aerobic exercise along with prescribed medications to achieve BP 140/90 or less.   Lipids Yes   Goal Cholesterol controlled with medications as prescribed, with individualized exercise RX and with personalized nutrition plan. Value goals: LDL < 781m HDL > 4050mParticipant states understanding of desired cholesterol values and following prescriptions.   Intervention Provide nutrition & aerobic exercise along with prescribed medications to achieve LDL <19m55mDL >40mg18mStress Yes   Goal To meet with psychosocial counselor for stress and relaxation information and guidance. To state understanding of performing relaxation techniques and or identifying personal stressors.   Intervention Provide education on types of stress, identifiying stressors, and ways to cope with stress. Provide demonstration and active practice of relaxation techniques.      Personal Goals and Risk Factors Review:      Goals and Risk Factor Review      02/09/15 1020 02/11/15 0857 02/25/15 1141 02/27/15 0943     Weight Management   Goals Progress/Improvement seen Yes Yes      Comments Patient has been aware of sodium intake and has been reading labels and is more aware of how much sodium he is consuming. Current weight 315.9. Patient has increased vegetable intake and making smarter choices when eating at restaurants even though his numbers have not improved much on the scale.      Increase Aerobic Exercise and Physical Activity   Goals Progress/Improvement seen   Yes No;Yes Yes    Comments  plans to walk outside and home and want to walk on the treadmill before he graduates.  Not alot of progress due to pain and soreness       Personal Goals Discharge (Final Personal Goals and Risk Factors Review):      Goals and Risk Factor Review - 02/27/15 0943    Increase Aerobic Exercise and Physical Activity   Goals Progress/Improvement seen  Yes   Comments Not alot of progress due to pain and soreness       Comments:  Out with a kidney stone.

## 2015-03-12 NOTE — Progress Notes (Unsigned)
Cardiac Individual Treatment Plan  Patient Details  Name: Victor Wise MRN: [id number] Date of Birth: [birth year] Referring Provider:  No ref. provider found  Initial Encounter Date:    Visit Diagnosis: NSTEMI (non-ST elevated myocardial infarction) ([place])  Patient's Home Medications on Admission:  Current outpatient prescriptions:  .  amLODipine (NORVASC) 10 MG tablet, Take 10 mg by mouth daily., Disp: , Rfl:  .  aspirin EC 81 MG tablet, Take 81 mg by mouth daily., Disp: , Rfl:  .  aspirin-acetaminophen-caffeine (EXCEDRIN MIGRAINE) 250-250-65 MG per tablet, Take 2 tablets by mouth every 6 (six) hours as needed for headache., Disp: , Rfl:  .  benazepril (LOTENSIN) 40 MG tablet, Take 40 mg by mouth 2 (two) times daily. , Disp: , Rfl:  .  buPROPion (WELLBUTRIN XL) 150 MG 24 hr tablet, Take 150 mg by mouth 2 (two) times daily., Disp: , Rfl:  .  busPIRone (BUSPAR) 5 MG tablet, Take by mouth., Disp: , Rfl:  .  cetirizine (ZYRTEC) 10 MG tablet, Take 10 mg by mouth daily., Disp: , Rfl:  .  clopidogrel (PLAVIX) 75 MG tablet, Take 75 mg by mouth daily., Disp: , Rfl:  .  DULoxetine (CYMBALTA) 30 MG capsule, Take 30-60 mg by mouth 2 (two) times daily. 60 mg every morning and 30 mg every evening., Disp: , Rfl:  .  DULoxetine (CYMBALTA) 30 MG capsule, Take 90 mg by mouth daily., Disp: , Rfl:  .  fenofibrate 54 MG tablet, Take 54 mg by mouth every evening., Disp: , Rfl:  .  fexofenadine (ALLEGRA) 180 MG tablet, Take 180 mg by mouth daily., Disp: , Rfl:  .  fluticasone (FLONASE) 50 MCG/ACT nasal spray, Place 2 sprays into both nostrils daily., Disp: , Rfl:  .  glipiZIDE-metformin (METAGLIP) 5-500 MG per tablet, Take 2 tablets by mouth 2 (two) times daily., Disp: , Rfl:  .  hydrochlorothiazide (MICROZIDE) 12.5 MG capsule, Take 12.5 mg by mouth daily., Disp: , Rfl:  .  insulin aspart (NOVOLOG FLEXPEN) 100 UNIT/ML FlexPen, Inject 0-15 Units into the skin 3 (three) times daily with meals as needed for high  blood sugar. , Disp: , Rfl:  .  Insulin Glargine (LANTUS SOLOSTAR) 100 UNIT/ML Solostar Pen, Inject 41 Units into the skin at bedtime., Disp: , Rfl:  .  levofloxacin (LEVAQUIN) 750 MG tablet, Take 1 tablet (750 mg total) by mouth daily. (Patient not taking: Reported on 01/20/2015), Disp: 3 tablet, Rfl: 0 .  Lido-Capsaicin-Men-Methyl Sal (MEDI-PATCH-LIDOCAINE EX), Apply topically., Disp: , Rfl:  .  Liraglutide (VICTOZA) 18 MG/3ML SOPN, Inject 0.6 mg into the skin every evening. , Disp: , Rfl:  .  metaxalone (SKELAXIN) 800 MG tablet, Take 800 mg by mouth 3 (three) times daily. Take half tab orally prn, Disp: , Rfl:  .  metoprolol tartrate (LOPRESSOR) 25 MG tablet, Take 0.5 tablets (12.5 mg total) by mouth 2 (two) times daily., Disp: 30 tablet, Rfl: 1 .  mometasone (NASONEX) 50 MCG/ACT nasal spray, Place 2 sprays into the nose daily., Disp: , Rfl:  .  montelukast (SINGULAIR) 10 MG tablet, Take 10 mg by mouth daily., Disp: , Rfl:  .  nitroGLYCERIN (NITROSTAT) 0.4 MG SL tablet, Place 1 tablet (0.4 mg total) under the tongue every 5 (five) minutes x 3 doses as needed for chest pain., Disp: 25 tablet, Rfl: 1 .  Omega-3 Krill Oil 300 MG CAPS, Take 1 capsule by mouth daily., Disp: , Rfl:  .  Oxcarbazepine (TRILEPTAL) 300 MG tablet, Take  300 mg by mouth 2 (two) times daily. , Disp: , Rfl:  .  oxyCODONE (OXY IR/ROXICODONE) 5 MG immediate release tablet, Take 5 mg by mouth 3 (three) times daily as needed for moderate pain. , Disp: , Rfl:  .  OxyCODONE (OXYCONTIN) 20 mg T12A 12 hr tablet, Take 20 mg by mouth 3 (three) times daily., Disp: , Rfl:  .  pantoprazole (PROTONIX) 40 MG tablet, Take 40 mg by mouth daily., Disp: , Rfl:  .  pregabalin (LYRICA) 200 MG capsule, Take 200 mg by mouth 3 (three) times daily., Disp: , Rfl:  .  tamsulosin (FLOMAX) 0.4 MG CAPS capsule, Take 0.4 mg by mouth daily., Disp: , Rfl:   Past Medical History: Past Medical History  Diagnosis Date  . Diabetes ([place])   . Hypertension   .  BPH (benign prostatic hyperplasia)   . 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 Morbid obesity ([place])   . Obstructive sleep apnea   . Chronic back pain   . Depression   . UTI (lower urinary tract infection)     Tobacco Use: History  Smoking status  . Former Smoker  . Types: Cigarettes  Smokeless tobacco  . Not on file    Labs: Recent Review Flowsheet Data    Labs for ITP Cardiac and Pulmonary Rehab Latest Ref Rng 08/30/2013 12/19/2014 12/20/2014   Cholestrol 0 - 200 mg/dL 180 - 147   LDLCALC 0 - 99 mg/dL SEE COMMENT - UNABLE TO CALCULATE IF TRIGLYCERIDE OVER 400 mg/dL   HDL >40 mg/dL 27(L) - 14(L)   Trlycerides <150 mg/dL 465(H) - 512(H)   Hemoglobin A1c 4.8 - 5.6 % - 7.8(H) -       Exercise Target Goals:    Exercise Program Goal: Individual exercise prescription set with THRR, safety & activity barriers. Participant demonstrates ability to understand and report RPE using BORG scale, to self-measure pulse accurately, and to acknowledge the importance of the exercise prescription.  Exercise Prescription Goal: Starting with aerobic activity 30 plus minutes a day, 3 days per week for initial exercise prescription. Provide home exercise prescription and guidelines that participant acknowledges understanding prior to discharge.  Activity Barriers & Risk Stratification:     Activity Barriers & Risk Stratification - 01/20/15 1754    Activity Barriers & Risk Stratification   Activity Barriers Balance Concerns;History of Falls;Neck/Spine Problems;Back Problems;Deconditioning;Muscular Weakness;Assistive Device;Joint Problems      6 Minute Walk:     6 Minute Walk      01/20/15 1513       6 Minute Walk   Phase Initial     Distance 470 feet     Walk Time 4.5 minutes     Resting HR 75 bpm     Resting BP 100/60 mmHg     Max Ex. HR 113 bpm     Max Ex. BP 132/60 mmHg     RPE 17     Symptoms No        Initial Exercise Prescription:     Initial Exercise Prescription - 01/20/15  1500    Date of Initial Exercise Prescription   Date 01/20/15   Treadmill   MPH 0.8  Use intervals to complete time goals   Grade 0   Minutes 5   Bike   Level 0.2   Minutes 10   Recumbant Bike   Level 2   RPM 40   Watts 20   Minutes 15   NuStep   Level 2  Watts 30   Minutes 10   Arm Ergometer   Level 1   Watts 8   Minutes 10   Arm/Foot Ergometer   Level 4   Watts 12   Minutes 10   Cybex   Level 1   RPM 50   Minutes 10   Recumbant Elliptical   Level 1   RPM 40   Watts 10   Minutes 10   Elliptical   Level 1   Speed 3   Minutes 1   REL-XR   Level 1   Watts 25   Minutes 10   Prescription Details   Frequency (times per week) 3   Duration Progress to 30 minutes of continuous aerobic without signs/symptoms of physical distress   Intensity   THRR REST +  30   Ratings of Perceived Exertion 11-15   Progression Continue progressive overload as per policy without signs/symptoms or physical distress.   Resistance Training   Training Prescription Yes   Weight 2   Reps 10-15      Exercise Prescription Changes:     Exercise Prescription Changes      01/27/15 0600 02/04/15 0800 02/11/15 0900 02/17/15 0700     Exercise Review   Progression Yes Yes Yes Yes    Response to Exercise   Blood Pressure (Admit) 148/82 mmHg   148/80 mmHg    Blood Pressure (Exercise) 146/82 mmHg   144/80 mmHg    Blood Pressure (Exit) 136/84 mmHg   140/88 mmHg    Heart Rate (Admit) 67 bpm   77 bpm    Heart Rate (Exercise) 67 bpm   78 bpm    Heart Rate (Exit) 65 bpm   60 bpm    Rating of Perceived Exertion (Exercise) 11   10    Symptoms No   Some soreness from exercise.    Comments    Victor Wise was having some soreness from the NuStep so his level was dropped back and his time was increased. He said this has helped.     Duration Progress to 30 minutes of continuous aerobic without signs/symptoms of physical distress Progress to 30 minutes of continuous aerobic without signs/symptoms of  physical distress Progress to 30 minutes of continuous aerobic without signs/symptoms of physical distress Progress to 30 minutes of continuous aerobic without signs/symptoms of physical distress    Intensity Rest + 30 Rest + 30 Rest + 30 Rest + 30    Progression Continue progressive overload as per policy without signs/symptoms or physical distress. Continue progressive overload as per policy without signs/symptoms or physical distress. Continue progressive overload as per policy without signs/symptoms or physical distress. Continue progressive overload as per policy without signs/symptoms or physical distress.    Resistance Training   Training Prescription Yes Yes Yes Yes    Weight _0 Reps 10-15 10-15 10-15 10-15    Interval Training   Interval Training No No No No    NuStep   Level _1 Watts 15 15 40 20    Minutes _2 Recumbant Elliptical   Level  1 4  Bio Step 4  Bio Step    RPM  40 40 40    Watts  _3 Minutes  _4 Discharge Exercise Prescription (Final Exercise Prescription Changes):     Exercise Prescription Changes - 02/17/15  0700    Exercise Review   Progression Yes   Response to Exercise   Blood Pressure (Admit) 148/80 mmHg   Blood Pressure (Exercise) 144/80 mmHg   Blood Pressure (Exit) 140/88 mmHg   Heart Rate (Admit) 77 bpm   Heart Rate (Exercise) 78 bpm   Heart Rate (Exit) 60 bpm   Rating of Perceived Exertion (Exercise) 10   Symptoms Some soreness from exercise.   Comments Victor Wise was having some soreness from the NuStep so his level was dropped back and his time was increased. He said this has helped.    Duration Progress to 30 minutes of continuous aerobic without signs/symptoms of physical distress   Intensity Rest + 30   Progression Continue progressive overload as per policy without signs/symptoms or physical distress.   Resistance Training   Training Prescription Yes   Weight 3   Reps 10-15   Interval Training    Interval Training No   NuStep   Level 3   Watts 20   Minutes 20   Recumbant Elliptical   Level 4  Bio Step   RPM 40   Watts 25   Minutes 10      Nutrition:  Target Goals: Understanding of nutrition guidelines, daily intake of sodium <1556m, cholesterol <2017m calories 30% from fat and 7% or less from saturated fats, daily to have 5 or more servings of fruits and vegetables.  Biometrics:     Pre Biometrics - 01/20/15 1451    Pre Biometrics   Height 5' 8.5" (1.74 m)   Weight (!) 307 lb 6.4 oz (139.436 kg)   Waist Circumference 58.25 inches   Hip Circumference 59 inches   Waist to Hip Ratio 0.99 %   BMI (Calculated) 46.2       Nutrition Therapy Plan and Nutrition Goals:     Nutrition Therapy & Goals - 01/30/15 1635    Nutrition Therapy   Diet Instructed on a meal plan based on 1800 calories including dietary guidelines for diabetes and heart health.   Fiber 30 grams   Whole Grain Foods 3 servings   Protein 8 ounces/day   Saturated Fats 12 max. grams   Fruits and Vegetables 5 servings/day   Personal Nutrition Goals   Personal Goal #1 Include at least 2 servings of carbohydrate per meal with range of 2-4 servings/meal.   Personal Goal #2 Balance meals with protein, 2-4 servings of carbohydrate and "free vegetables"   Personal Goal #3 Space 3 meals/day 4-5 hours apart   Comments At present, patient does not have a consistant meal plan and typically averages 2 meals per day + snacks.      Nutrition Discharge: Rate Your Plate Scores:     Rate Your Plate - [id number]/4162878  Rate Your Plate Scores   Pre Score 74   Pre Score % 82 %      Nutrition Goals Re-Evaluation:     Nutrition Goals Re-Evaluation      02/25/15 1140           Personal Goal #3 Re-Evaluation   Personal Goal #3 [place] trying to do this.        Goal Progress Seen Yes          Psychosocial: Target Goals: Acknowledge presence or absence of depression, maximize coping skills,  provide positive support system. Participant is able to verbalize types and ability to use techniques and skills needed for reducing stress and depression.  Initial Review & Psychosocial  Screening:     Initial Psych Review & Screening - 01/20/15 1746    Initial Review   Current issues with Current Depression;History of Depression;Current Psychotropic Meds;Current Sleep Concerns   Family Dynamics   Good Support System? Yes   Comments Patient states he lives with his sister Kerry Dory, "who is opinionated and has red hair."  His sister Jeani Hawking and his daughter [name] are his main supporters.  He would like to get stronger and try to move into senior housing.  Divorced, has one daughter and one grand-daughter.  See PHQ-9 score.     Barriers   Psychosocial barriers to participate in program The patient should benefit from training in stress management and relaxation.  Pt has a PHQ-9 score of 24.     Screening Interventions   Interventions Program counselor consult      Quality of Life Scores:     Quality of Life - 01/20/15 1752    Quality of Life Scores   Health/Function Pre 6.23 %   Socioeconomic Pre 10.5 %   Psych/Spiritual Pre 5.79 %   Family Pre 5.9 %   GLOBAL Pre 6.97 %      PHQ-9:     Recent Review Flowsheet Data    Depression screen [facility] 2/9 01/20/2015   Decreased Interest 3   Down, Depressed, Hopeless 3   PHQ - 2 Score 6   Altered sleeping 3   Tired, decreased energy 3   Change in appetite 3   Feeling bad or failure about yourself  3   Trouble concentrating 3   Moving slowly or fidgety/restless 3   Suicidal thoughts 0   PHQ-9 Score 24   Difficult doing work/chores Extremely dIfficult      Psychosocial Evaluation and Intervention:     Psychosocial Evaluation - 01/26/15 1017    Psychosocial Evaluation & Interventions   Interventions Stress management education;Relaxation education;Encouraged to exercise with the program and follow exercise prescription    Comments Counselor met with Victor Wise today for initial psychosocial evaluation.  He is a 64 year old who reports having multiple health issues, including more recently a heart attack in August.  He has a limited support system as he lives with his sister and brother-in-law and has a daughter close by.  Victor Wise reports sleeping "odd hours" with help of a CPAP.  He admits to a history of "clinical depression" and is currently on medications for this and for anxiety.  His PHQ-9 scores were significantly high so counselor questioned efficacy of his medications, with Victor Wise stating they are not very effective.  He admits to multiple stressors, including his health, his living arrangements with his sister and brother-in-law and finances.  His mood was depressed and he engaged in a great deal of negative self-talk, denying suicidal ideations currently.  Therapist recommended seeing a therapist and Victor Wise states he "cannot afford the copay."  Counselor will research local programs to meet this need and educated Victor Wise on the benefit of medications and psychotherapy for clinical depression.  Counselor will follow up regularly with Victor Wise.   Continued Psychosocial Services Needed Yes  Victor Wise will benefit from psychotherapy for his clinical depression.  He also will benefit from the psychoeducational components of this program, and meeting with the dietician to address weight loss goals.       Psychosocial Re-Evaluation:     Psychosocial Re-Evaluation      02/18/15 1033 02/25/15 1141  Psychosocial Re-Evaluation   Comments Counselor provided Victor Wise with information recently on psychiatrists and therapists locally who are affordable.  He continues to engage in negative self-talk, although today he was more positive, especially when talking about spending time with his granddaughters.  Victor Wise will benefit from med eval for efficacy and ongoing therapy for his mood.   This was recommended to him by this  counselor.  Victor Wise has mulitple stressors incl his sister being in the hospital so he had to miss a few sessions of Cardiac Rehab.       Continued Psychosocial Services Needed  Yes         Vocational Rehabilitation: Provide vocational rehab assistance to qualifying candidates.   Vocational Rehab Evaluation & Intervention:     Vocational Rehab - 01/20/15 1737    Initial Vocational Rehab Evaluation & Intervention   Assessment shows need for Vocational Rehabilitation No      Education: Education Goals: Education classes will be provided on a weekly basis, covering required topics. Participant will state understanding/return demonstration of topics presented.  Learning Barriers/Preferences:     Learning Barriers/Preferences - 01/20/15 1735    Learning Barriers/Preferences   Learning Barriers Hearing   Learning Preferences Written Material      Education Topics: General Nutrition Guidelines/Fats and Fiber: -Group instruction provided by verbal, written material, models and posters to present the general guidelines for heart healthy nutrition. Gives an explanation and review of dietary fats and fiber.          Cardiac Rehab from 03/02/2015 in [facility] Cardiac Rehab   Date  02/23/15   Educator  PI   Instruction Review Code  2- meets goals/outcomes      Controlling Sodium/Reading Food Labels: -Group verbal and written material supporting the discussion of sodium use in heart healthy nutrition. Review and explanation with models, verbal and written materials for utilization of the food label.   Exercise Physiology & Risk Factors: - Group verbal and written instruction with models to review the exercise physiology of the cardiovascular system and associated critical values. Details cardiovascular disease risk factors and the goals associated with each risk factor.   Aerobic Exercise & Resistance Training: - Gives group verbal and written discussion on the health impact of  inactivity. On the components of aerobic and resistive training programs and the benefits of this training and how to safely progress through these programs.   Flexibility, Balance, General Exercise Guidelines: - Provides group verbal and written instruction on the benefits of flexibility and balance training programs. Provides general exercise guidelines with specific guidelines to those with heart or lung disease. Demonstration and skill practice provided.      Cardiac Rehab from 03/02/2015 in [facility] Cardiac Rehab   Date  02/02/15   Educator  [facility]   Instruction Review Code  2- meets goals/outcomes      Stress Management: - Provides group verbal and written instruction about the health risks of elevated stress, cause of high stress, and healthy ways to reduce stress.      Cardiac Rehab from 03/02/2015 in [facility] Cardiac Rehab   Date  02/04/15   Educator  [facility]   Instruction Review Code  2- meets goals/outcomes      Depression: - Provides group verbal and written instruction on the correlation between heart/lung disease and depressed mood, treatment options, and the stigmas associated with seeking treatment.   Anatomy & Physiology of the Heart: - Group verbal and written instruction and models provide basic cardiac  anatomy and physiology, with the coronary electrical and arterial systems. Review of: AMI, Angina, Valve disease, Heart Failure, Cardiac Arrhythmia, Pacemakers, and the ICD.      Cardiac Rehab from 03/02/2015 in [facility] Cardiac Rehab   Date  02/09/15   Educator  SB   Instruction Review Code  2- meets goals/outcomes      Cardiac Procedures: - Group verbal and written instruction and models to describe the testing methods done to diagnose heart disease. Reviews the outcomes of the test results. Describes the treatment choices: Medical Management, Angioplasty, or Coronary Bypass Surgery.      Cardiac Rehab from 03/02/2015 in [facility] Cardiac Rehab   Date  03/02/15   Educator  SB    Instruction Review Code  2- meets goals/outcomes      Cardiac Medications: - Group verbal and written instruction to review commonly prescribed medications for heart disease. Reviews the medication, class of the drug, and side effects. Includes the steps to properly store meds and maintain the prescription regimen.   Go Sex-Intimacy & Heart Disease, Get SMART - Goal Setting: - Group verbal and written instruction through game format to discuss heart disease and the return to sexual intimacy. Provides group verbal and written material to discuss and apply goal setting through the application of the S.M.A.R.T. Method.      Cardiac Rehab from 03/02/2015 in [facility] Cardiac Rehab   Date  03/02/15   Educator  SB   Instruction Review Code  2- meets goals/outcomes      Other Matters of the Heart: - Provides group verbal, written materials and models to describe Heart Failure, Angina, Valve Disease, and Diabetes in the realm of heart disease. Includes description of the disease process and treatment options available to the cardiac patient.      Cardiac Rehab from 03/02/2015 in [facility] Cardiac Rehab   Date  02/18/15   Educator  [place]   Instruction Review Code  2- meets goals/outcomes      Exercise & Equipment Safety: - Individual verbal instruction and demonstration of equipment use and safety with use of the equipment.      Cardiac Rehab from 03/02/2015 in [facility] Cardiac Rehab   Date  01/20/15   Educator  D. Joya Gaskins, RN   Instruction Review Code  1- partially meets, needs review/practice      Infection Prevention: - Provides verbal and written material to individual with discussion of infection control including proper hand washing and proper equipment cleaning during exercise session.      Cardiac Rehab from 03/02/2015 in [facility] Cardiac Rehab   Date  01/20/15   Educator  D. Joya Gaskins, RN   Instruction Review Code  2- meets goals/outcomes      Falls Prevention: - Provides verbal and written  material to individual with discussion of falls prevention and safety.      Cardiac Rehab from 03/02/2015 in [facility] Cardiac Rehab   Date  01/20/15   Educator  D. Joya Gaskins, RN   Instruction Review Code  1- partially meets, needs review/practice      Diabetes: - Individual verbal and written instruction to review signs/symptoms of diabetes, desired ranges of glucose level fasting, after meals and with exercise. Advice that pre and post exercise glucose checks will be done for 3 sessions at entry of program.      Cardiac Rehab from 03/02/2015 in [facility] Cardiac Rehab   Date  01/20/15   Educator  D. Joya Gaskins, RN   Instruction Review Code  1- partially  meets, needs review/practice [Needs to meet wtih dietitian about meal planning]       Knowledge Questionnaire Score:     Knowledge Questionnaire Score - 01/20/15 1735    Knowledge Questionnaire Score   Pre Score 25/28      Personal Goals and Risk Factors at Admission:     Personal Goals and Risk Factors at Admission - 01/20/15 1745    Personal Goals and Risk Factors on Admission    Weight Management Yes   Intervention Learn and follow the exercise and diet guidelines while in the program. Utilize the nutrition and education classes to help gain knowledge of the diet and exercise expectations in the program   Admit Weight 307 lb 6.4 oz (139.436 kg)   Increase Aerobic Exercise and Physical Activity Yes   Diabetes Yes   Goal Blood glucose control identified by blood glucose values, HgbA1C. Participant verbalizes understanding of the signs/symptoms of hyper/hypo glycemia, proper foot care and importance of medication and nutrition plan for blood glucose control.   Intervention Provide nutrition & aerobic exercise along with prescribed medications to achieve blood glucose in normal ranges: Fasting 65-99 mg/dL   Hypertension Yes   Goal Participant will see blood pressure controlled within the values of 140/68m/Hg or within value directed by their  physician.   Intervention Provide nutrition & aerobic exercise along with prescribed medications to achieve BP 140/90 or less.   Lipids Yes   Goal Cholesterol controlled with medications as prescribed, with individualized exercise RX and with personalized nutrition plan. Value goals: LDL < 724m HDL > 4032mParticipant states understanding of desired cholesterol values and following prescriptions.   Intervention Provide nutrition & aerobic exercise along with prescribed medications to achieve LDL <69m92mDL >40mg12mStress Yes   Goal To meet with psychosocial counselor for stress and relaxation information and guidance. To state understanding of performing relaxation techniques and or identifying personal stressors.   Intervention Provide education on types of stress, identifiying stressors, and ways to cope with stress. Provide demonstration and active practice of relaxation techniques.      Personal Goals and Risk Factors Review:      Goals and Risk Factor Review      02/09/15 1020 02/11/15 0857 02/25/15 1141 02/27/15 0943     Weight Management   Goals Progress/Improvement seen Yes Yes      Comments Patient has been aware of sodium intake and has been reading labels and is more aware of how much sodium he is consuming. Current weight 315.9. Patient has increased vegetable intake and making smarter choices when eating at restaurants even though his numbers have not improved much on the scale.      Increase Aerobic Exercise and Physical Activity   Goals Progress/Improvement seen   Yes No;Yes Yes    Comments  plans to walk outside and home and want to walk on the treadmill before he graduates.  Not alot of progress due to pain and soreness       Personal Goals Discharge (Final Personal Goals and Risk Factors Review):      Goals and Risk Factor Review - 02/27/15 0943    Increase Aerobic Exercise and Physical Activity   Goals Progress/Improvement seen  Yes   Comments Not alot of progress  due to pain and soreness       Comments: Out with a kidney stone currently.

## 2015-03-13 ENCOUNTER — Telehealth: Payer: Self-pay | Admitting: *Deleted

## 2015-03-13 NOTE — Telephone Encounter (Signed)
Victor Wise is still having a lot of pain and he had a CT scan which revealed a very large kidney stone. He most likely will not be able to pass it and is awaiting details on what they are going to do. He really wants to come back to rehab and will keep [place] updated on his progress and return date

## 2015-03-16 ENCOUNTER — Encounter: Payer: Self-pay | Admitting: *Deleted

## 2015-03-18 ENCOUNTER — Encounter: Payer: Commercial Managed Care - HMO | Attending: Internal Medicine

## 2015-03-18 DIAGNOSIS — Z9889 Other specified postprocedural states: Secondary | ICD-10-CM | POA: Insufficient documentation

## 2015-03-18 DIAGNOSIS — I214 Non-ST elevation (NSTEMI) myocardial infarction: Secondary | ICD-10-CM | POA: Insufficient documentation

## 2015-03-23 ENCOUNTER — Encounter: Payer: Self-pay | Admitting: *Deleted

## 2015-03-23 DIAGNOSIS — I214 Non-ST elevation (NSTEMI) myocardial infarction: Secondary | ICD-10-CM

## 2015-03-23 DIAGNOSIS — Z9861 Coronary angioplasty status: Secondary | ICD-10-CM

## 2015-03-23 NOTE — Progress Notes (Unsigned)
Cardiac Individual Treatment Plan  Patient Details  Name: Victor Wise MRN: [id number] Date of Birth: 24-Aug-1950 Referring Provider:  Corey Skains, MD  Initial Encounter Date:    Visit Diagnosis: NSTEMI (non-ST elevated myocardial infarction) [facility])  History of percutaneous coronary intervention  Patient's Home Medications on Admission:  Current outpatient prescriptions:  .  amLODipine (NORVASC) 10 MG tablet, Take 10 mg by mouth daily., Disp: , Rfl:  .  aspirin EC 81 MG tablet, Take 81 mg by mouth daily., Disp: , Rfl:  .  aspirin-acetaminophen-caffeine (EXCEDRIN MIGRAINE) 250-250-65 MG per tablet, Take 2 tablets by mouth every 6 (six) hours as needed for headache., Disp: , Rfl:  .  benazepril (LOTENSIN) 40 MG tablet, Take 40 mg by mouth 2 (two) times daily. , Disp: , Rfl:  .  buPROPion (WELLBUTRIN XL) 150 MG 24 hr tablet, Take 150 mg by mouth 2 (two) times daily., Disp: , Rfl:  .  busPIRone (BUSPAR) 5 MG tablet, Take by mouth., Disp: , Rfl:  .  cetirizine (ZYRTEC) 10 MG tablet, Take 10 mg by mouth daily., Disp: , Rfl:  .  clopidogrel (PLAVIX) 75 MG tablet, Take 75 mg by mouth daily., Disp: , Rfl:  .  DULoxetine (CYMBALTA) 30 MG capsule, Take 30-60 mg by mouth 2 (two) times daily. 60 mg every morning and 30 mg every evening., Disp: , Rfl:  .  DULoxetine (CYMBALTA) 30 MG capsule, Take 90 mg by mouth daily., Disp: , Rfl:  .  fenofibrate 54 MG tablet, Take 54 mg by mouth every evening., Disp: , Rfl:  .  fexofenadine (ALLEGRA) 180 MG tablet, Take 180 mg by mouth daily., Disp: , Rfl:  .  fluticasone (FLONASE) 50 MCG/ACT nasal spray, Place 2 sprays into both nostrils daily., Disp: , Rfl:  .  glipiZIDE-metformin (METAGLIP) 5-500 MG per tablet, Take 2 tablets by mouth 2 (two) times daily., Disp: , Rfl:  .  hydrochlorothiazide (MICROZIDE) 12.5 MG capsule, Take 12.5 mg by mouth daily., Disp: , Rfl:  .  insulin aspart (NOVOLOG FLEXPEN) 100 UNIT/ML FlexPen, Inject 0-15 Units into the skin 3  (three) times daily with meals as needed for high blood sugar. , Disp: , Rfl:  .  Insulin Glargine (LANTUS SOLOSTAR) 100 UNIT/ML Solostar Pen, Inject 41 Units into the skin at bedtime., Disp: , Rfl:  .  levofloxacin (LEVAQUIN) 750 MG tablet, Take 1 tablet (750 mg total) by mouth daily. (Patient not taking: Reported on 01/20/2015), Disp: 3 tablet, Rfl: 0 .  Lido-Capsaicin-Men-Methyl Sal (MEDI-PATCH-LIDOCAINE EX), Apply topically., Disp: , Rfl:  .  Liraglutide (VICTOZA) 18 MG/3ML SOPN, Inject 0.6 mg into the skin every evening. , Disp: , Rfl:  .  metaxalone (SKELAXIN) 800 MG tablet, Take 800 mg by mouth 3 (three) times daily. Take half tab orally prn, Disp: , Rfl:  .  metoprolol tartrate (LOPRESSOR) 25 MG tablet, Take 0.5 tablets (12.5 mg total) by mouth 2 (two) times daily., Disp: 30 tablet, Rfl: 1 .  mometasone (NASONEX) 50 MCG/ACT nasal spray, Place 2 sprays into the nose daily., Disp: , Rfl:  .  montelukast (SINGULAIR) 10 MG tablet, Take 10 mg by mouth daily., Disp: , Rfl:  .  nitroGLYCERIN (NITROSTAT) 0.4 MG SL tablet, Place 1 tablet (0.4 mg total) under the tongue every 5 (five) minutes x 3 doses as needed for chest pain., Disp: 25 tablet, Rfl: 1 .  Omega-3 Krill Oil 300 MG CAPS, Take 1 capsule by mouth daily., Disp: , Rfl:  .  Oxcarbazepine (TRILEPTAL) 300 MG tablet, Take 300 mg by mouth 2 (two) times daily. , Disp: , Rfl:  .  oxyCODONE (OXY IR/ROXICODONE) 5 MG immediate release tablet, Take 5 mg by mouth 3 (three) times daily as needed for moderate pain. , Disp: , Rfl:  .  OxyCODONE (OXYCONTIN) 20 mg T12A 12 hr tablet, Take 20 mg by mouth 3 (three) times daily., Disp: , Rfl:  .  pantoprazole (PROTONIX) 40 MG tablet, Take 40 mg by mouth daily., Disp: , Rfl:  .  pregabalin (LYRICA) 200 MG capsule, Take 200 mg by mouth 3 (three) times daily., Disp: , Rfl:  .  tamsulosin (FLOMAX) 0.4 MG CAPS capsule, Take 0.4 mg by mouth daily., Disp: , Rfl:   Past Medical History: Past Medical History  Diagnosis  Date  . Diabetes ([place])   . Hypertension   . BPH (benign prostatic hyperplasia)   . 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 Morbid obesity ([place])   . Obstructive sleep apnea   . Chronic back pain   . Depression   . UTI (lower urinary tract infection)     Tobacco Use: History  Smoking status  . Former Smoker  . Types: Cigarettes  Smokeless tobacco  . Not on file    Labs: Recent Review Flowsheet Data    Labs for ITP Cardiac and Pulmonary Rehab Latest Ref Rng 08/30/2013 12/19/2014 12/20/2014   Cholestrol 0 - 200 mg/dL 180 - 147   LDLCALC 0 - 99 mg/dL SEE COMMENT - UNABLE TO CALCULATE IF TRIGLYCERIDE OVER 400 mg/dL   HDL >40 mg/dL 27(L) - 14(L)   Trlycerides <150 mg/dL 465(H) - 512(H)   Hemoglobin A1c 4.8 - 5.6 % - 7.8(H) -       Exercise Target Goals:    Exercise Program Goal: Individual exercise prescription set with THRR, safety & activity barriers. Participant demonstrates ability to understand and report RPE using BORG scale, to self-measure pulse accurately, and to acknowledge the importance of the exercise prescription.  Exercise Prescription Goal: Starting with aerobic activity 30 plus minutes a day, 3 days per week for initial exercise prescription. Provide home exercise prescription and guidelines that participant acknowledges understanding prior to discharge.  Activity Barriers & Risk Stratification:     Activity Barriers & Risk Stratification - 01/20/15 1754    Activity Barriers & Risk Stratification   Activity Barriers Balance Concerns;History of Falls;Neck/Spine Problems;Back Problems;Deconditioning;Muscular Weakness;Assistive Device;Joint Problems      6 Minute Walk:     6 Minute Walk      01/20/15 1513       6 Minute Walk   Phase Initial     Distance 470 feet     Walk Time 4.5 minutes     Resting HR 75 bpm     Resting BP 100/60 mmHg     Max Ex. HR 113 bpm     Max Ex. BP 132/60 mmHg     RPE 17     Symptoms No        Initial Exercise Prescription:      Initial Exercise Prescription - 01/20/15 1500    Date of Initial Exercise Prescription   Date 01/20/15   Treadmill   MPH 0.8  Use intervals to complete time goals   Grade 0   Minutes 5   Bike   Level 0.2   Minutes 10   Recumbant Bike   Level 2   RPM 40   Watts 20   Minutes 15  NuStep   Level 2   Watts 30   Minutes 10   Arm Ergometer   Level 1   Watts 8   Minutes 10   Arm/Foot Ergometer   Level 4   Watts 12   Minutes 10   Cybex   Level 1   RPM 50   Minutes 10   Recumbant Elliptical   Level 1   RPM 40   Watts 10   Minutes 10   Elliptical   Level 1   Speed 3   Minutes 1   REL-XR   Level 1   Watts 25   Minutes 10   Prescription Details   Frequency (times per week) 3   Duration Progress to 30 minutes of continuous aerobic without signs/symptoms of physical distress   Intensity   THRR REST +  30   Ratings of Perceived Exertion 11-15   Progression Continue progressive overload as per policy without signs/symptoms or physical distress.   Resistance Training   Training Prescription Yes   Weight 2   Reps 10-15      Exercise Prescription Changes:     Exercise Prescription Changes      01/27/15 0600 02/04/15 0800 02/11/15 0900 02/17/15 0700 03/16/15 1000   Exercise Review   Progression Yes Yes Yes Yes No  Emitt has been absent since 03/02/15 due to kidney stones.    Response to Exercise   Blood Pressure (Admit) 148/82 mmHg   148/80 mmHg 162/84 mmHg   Blood Pressure (Exercise) 146/82 mmHg   144/80 mmHg 144/80 mmHg   Blood Pressure (Exit) 136/84 mmHg   140/88 mmHg 130/74 mmHg   Heart Rate (Admit) 67 bpm   77 bpm 65 bpm   Heart Rate (Exercise) 67 bpm   78 bpm 62 bpm   Heart Rate (Exit) 65 bpm   60 bpm 71 bpm   Rating of Perceived Exertion (Exercise) 11   10 6    Symptoms No   Some soreness from exercise. Pain due to kidney stones which eventually got so bad he could no longer come to class until resolved.   Comments    Jayon was having some soreness  from the NuStep so his level was dropped back and his time was increased. He said this has helped.  Abdulhadi plans on returning to class, but no exercise progression will be made until he returns. He may even need to lower workloads and progress back up to where he was before he had to take time off.    Duration Progress to 30 minutes of continuous aerobic without signs/symptoms of physical distress Progress to 30 minutes of continuous aerobic without signs/symptoms of physical distress Progress to 30 minutes of continuous aerobic without signs/symptoms of physical distress Progress to 30 minutes of continuous aerobic without signs/symptoms of physical distress Progress to 30 minutes of continuous aerobic without signs/symptoms of physical distress   Intensity Rest + 30 Rest + 30 Rest + 30 Rest + 30 Rest + 30   Progression Continue progressive overload as per policy without signs/symptoms or physical distress. Continue progressive overload as per policy without signs/symptoms or physical distress. Continue progressive overload as per policy without signs/symptoms or physical distress. Continue progressive overload as per policy without signs/symptoms or physical distress. Continue progressive overload as per policy without signs/symptoms or physical distress.   Resistance Training   Training Prescription Yes Yes Yes Yes Yes   Weight 2 2 3 3 3    Reps 10-15 10-15 10-15 10-15  10-15   Interval Training   Interval Training No No No No No   NuStep   Level 2 2 6 3 3    Watts 15 15 40 20 20   Minutes 20 20 20 20 20    Recumbant Elliptical   Level  1 4  Bio Step 4  Bio Step 4  Bio Step   RPM  40 40 40 40   Watts  10 25 25 25    Minutes  10 10 10 10       Discharge Exercise Prescription (Final Exercise Prescription Changes):     Exercise Prescription Changes - 03/16/15 1000    Exercise Review   Progression No  Terek has been absent since 03/02/15 due to kidney stones.    Response to Exercise   Blood  Pressure (Admit) 162/84 mmHg   Blood Pressure (Exercise) 144/80 mmHg   Blood Pressure (Exit) 130/74 mmHg   Heart Rate (Admit) 65 bpm   Heart Rate (Exercise) 62 bpm   Heart Rate (Exit) 71 bpm   Rating of Perceived Exertion (Exercise) 6   Symptoms Pain due to kidney stones which eventually got so bad he could no longer come to class until resolved.   Comments Kordell plans on returning to class, but no exercise progression will be made until he returns. He may even need to lower workloads and progress back up to where he was before he had to take time off.    Duration Progress to 30 minutes of continuous aerobic without signs/symptoms of physical distress   Intensity Rest + 30   Progression Continue progressive overload as per policy without signs/symptoms or physical distress.   Resistance Training   Training Prescription Yes   Weight 3   Reps 10-15   Interval Training   Interval Training No   NuStep   Level 3   Watts 20   Minutes 20   Recumbant Elliptical   Level 4  Bio Step   RPM 40   Watts 25   Minutes 10      Nutrition:  Target Goals: Understanding of nutrition guidelines, daily intake of sodium <151m, cholesterol <2031m calories 30% from fat and 7% or less from saturated fats, daily to have 5 or more servings of fruits and vegetables.  Biometrics:     Pre Biometrics - 01/20/15 1451    Pre Biometrics   Height 5' 8.5" (1.74 m)   Weight (!) 307 lb 6.4 oz (139.436 kg)   Waist Circumference 58.25 inches   Hip Circumference 59 inches   Waist to Hip Ratio 0.99 %   BMI (Calculated) 46.2       Nutrition Therapy Plan and Nutrition Goals:     Nutrition Therapy & Goals - 01/30/15 1635    Nutrition Therapy   Diet Instructed on a meal plan based on 1800 calories including dietary guidelines for diabetes and heart health.   Fiber 30 grams   Whole Grain Foods 3 servings   Protein 8 ounces/day   Saturated Fats 12 max. grams   Fruits and Vegetables 5 servings/day    Personal Nutrition Goals   Personal Goal #1 Include at least 2 servings of carbohydrate per meal with range of 2-4 servings/meal.   Personal Goal #2 Balance meals with protein, 2-4 servings of carbohydrate and "free vegetables"   Personal Goal #3 Space 3 meals/day 4-5 hours apart   Comments At present, patient does not have a consistant meal plan and typically averages 2 meals per day +  snacks.      Nutrition Discharge: Rate Your Plate Scores:     Rate Your Plate - 19/37/90 2409    Rate Your Plate Scores   Pre Score 74   Pre Score % 82 %      Nutrition Goals Re-Evaluation:     Nutrition Goals Re-Evaluation      02/25/15 1140           Personal Goal #3 Re-Evaluation   Personal Goal #3 June is trying to do this.        Goal Progress Seen Yes          Psychosocial: Target Goals: Acknowledge presence or absence of depression, maximize coping skills, provide positive support system. Participant is able to verbalize types and ability to use techniques and skills needed for reducing stress and depression.  Initial Review & Psychosocial Screening:     Initial Psych Review & Screening - 01/20/15 1746    Initial Review   Current issues with Current Depression;History of Depression;Current Psychotropic Meds;Current Sleep Concerns   Family Dynamics   Good Support System? Yes   Comments Patient states he lives with his sister Kerry Dory, "who is opinionated and has red hair."  His sister Jeani Hawking and his daughter [name] are his main supporters.  He would like to get stronger and try to move into senior housing.  Divorced, has one daughter and one grand-daughter.  See PHQ-9 score.     Barriers   Psychosocial barriers to participate in program The patient should benefit from training in stress management and relaxation.  Pt has a PHQ-9 score of 24.     Screening Interventions   Interventions Program counselor consult      Quality of Life Scores:     Quality of Life - 01/20/15  1752    Quality of Life Scores   Health/Function Pre 6.23 %   Socioeconomic Pre 10.5 %   Psych/Spiritual Pre 5.79 %   Family Pre 5.9 %   GLOBAL Pre 6.97 %      PHQ-9:     Recent Review Flowsheet Data    Depression screen [facility] 2/9 01/20/2015   Decreased Interest 3   Down, Depressed, Hopeless 3   PHQ - 2 Score 6   Altered sleeping 3   Tired, decreased energy 3   Change in appetite 3   Feeling bad or failure about yourself  3   Trouble concentrating 3   Moving slowly or fidgety/restless 3   Suicidal thoughts 0   PHQ-9 Score 24   Difficult doing work/chores Extremely dIfficult      Psychosocial Evaluation and Intervention:     Psychosocial Evaluation - 01/26/15 1017    Psychosocial Evaluation & Interventions   Interventions Stress management education;Relaxation education;Encouraged to exercise with the program and follow exercise prescription   Comments Counselor met with Mr. Gorniak today for initial psychosocial evaluation.  He is a 64 year old who reports having multiple health issues, including more recently a heart attack in August.  He has a limited support system as he lives with his sister and brother-in-law and has a daughter close by.  Mr. Moncada reports sleeping "odd hours" with help of a CPAP.  He admits to a history of "clinical depression" and is currently on medications for this and for anxiety.  His PHQ-9 scores were significantly high so counselor questioned efficacy of his medications, with Mr. Viona Gilmore stating they are not very effective.  He admits to multiple stressors, including  his health, his living arrangements with his sister and brother-in-law and finances.  His mood was depressed and he engaged in a great deal of negative self-talk, denying suicidal ideations currently.  Therapist recommended seeing a therapist and Mr. Viona Gilmore states he "cannot afford the copay."  Counselor will research local programs to meet this need and educated Mr. Viona Gilmore on the benefit of medications and  psychotherapy for clinical depression.  Counselor will follow up regularly with Mr. Viona Gilmore.   Continued Psychosocial Services Needed Yes  Mr. W will benefit from psychotherapy for his clinical depression.  He also will benefit from the psychoeducational components of this program, and meeting with the dietician to address weight loss goals.       Psychosocial Re-Evaluation:     Psychosocial Re-Evaluation      02/18/15 1033 02/25/15 1141         Psychosocial Re-Evaluation   Comments Counselor provided Mr. Nowland with information recently on psychiatrists and therapists locally who are affordable.  He continues to engage in negative self-talk, although today he was more positive, especially when talking about spending time with his granddaughters.  Mr. Viona Gilmore will benefit from med eval for efficacy and ongoing therapy for his mood.   This was recommended to him by this counselor.  Rashed has mulitple stressors incl his sister being in the hospital so he had to miss a few sessions of Cardiac Rehab.       Continued Psychosocial Services Needed  Yes         Vocational Rehabilitation: Provide vocational rehab assistance to qualifying candidates.   Vocational Rehab Evaluation & Intervention:     Vocational Rehab - 01/20/15 1737    Initial Vocational Rehab Evaluation & Intervention   Assessment shows need for Vocational Rehabilitation No      Education: Education Goals: Education classes will be provided on a weekly basis, covering required topics. Participant will state understanding/return demonstration of topics presented.  Learning Barriers/Preferences:     Learning Barriers/Preferences - 01/20/15 1735    Learning Barriers/Preferences   Learning Barriers Hearing   Learning Preferences Written Material      Education Topics: General Nutrition Guidelines/Fats and Fiber: -Group instruction provided by verbal, written material, models and posters to present the general guidelines for heart  healthy nutrition. Gives an explanation and review of dietary fats and fiber.          Cardiac Rehab from 03/02/2015 in [facility] Cardiac Rehab   Date  02/23/15   Educator  PI   Instruction Review Code  2- meets goals/outcomes      Controlling Sodium/Reading Food Labels: -Group verbal and written material supporting the discussion of sodium use in heart healthy nutrition. Review and explanation with models, verbal and written materials for utilization of the food label.   Exercise Physiology & Risk Factors: - Group verbal and written instruction with models to review the exercise physiology of the cardiovascular system and associated critical values. Details cardiovascular disease risk factors and the goals associated with each risk factor.   Aerobic Exercise & Resistance Training: - Gives group verbal and written discussion on the health impact of inactivity. On the components of aerobic and resistive training programs and the benefits of this training and how to safely progress through these programs.   Flexibility, Balance, General Exercise Guidelines: - Provides group verbal and written instruction on the benefits of flexibility and balance training programs. Provides general exercise guidelines with specific guidelines to those with heart or lung disease.  Demonstration and skill practice provided.      Cardiac Rehab from 03/02/2015 in [facility] Cardiac Rehab   Date  02/02/15   Educator  [facility]   Instruction Review Code  2- meets goals/outcomes      Stress Management: - Provides group verbal and written instruction about the health risks of elevated stress, cause of high stress, and healthy ways to reduce stress.      Cardiac Rehab from 03/02/2015 in [facility] Cardiac Rehab   Date  02/04/15   Educator  [facility]   Instruction Review Code  2- meets goals/outcomes      Depression: - Provides group verbal and written instruction on the correlation between heart/lung disease and depressed mood,  treatment options, and the stigmas associated with seeking treatment.   Anatomy & Physiology of the Heart: - Group verbal and written instruction and models provide basic cardiac anatomy and physiology, with the coronary electrical and arterial systems. Review of: AMI, Angina, Valve disease, Heart Failure, Cardiac Arrhythmia, Pacemakers, and the ICD.      Cardiac Rehab from 03/02/2015 in [facility] Cardiac Rehab   Date  02/09/15   Educator  SB   Instruction Review Code  2- meets goals/outcomes      Cardiac Procedures: - Group verbal and written instruction and models to describe the testing methods done to diagnose heart disease. Reviews the outcomes of the test results. Describes the treatment choices: Medical Management, Angioplasty, or Coronary Bypass Surgery.      Cardiac Rehab from 03/02/2015 in [facility] Cardiac Rehab   Date  03/02/15   Educator  SB   Instruction Review Code  2- meets goals/outcomes      Cardiac Medications: - Group verbal and written instruction to review commonly prescribed medications for heart disease. Reviews the medication, class of the drug, and side effects. Includes the steps to properly store meds and maintain the prescription regimen.   Go Sex-Intimacy & Heart Disease, Get SMART - Goal Setting: - Group verbal and written instruction through game format to discuss heart disease and the return to sexual intimacy. Provides group verbal and written material to discuss and apply goal setting through the application of the S.M.A.R.T. Method.      Cardiac Rehab from 03/02/2015 in [facility] Cardiac Rehab   Date  03/02/15   Educator  SB   Instruction Review Code  2- meets goals/outcomes      Other Matters of the Heart: - Provides group verbal, written materials and models to describe Heart Failure, Angina, Valve Disease, and Diabetes in the realm of heart disease. Includes description of the disease process and treatment options available to the cardiac patient.       Cardiac Rehab from 03/02/2015 in [facility] Cardiac Rehab   Date  02/18/15   Educator  [place]   Instruction Review Code  2- meets goals/outcomes      Exercise & Equipment Safety: - Individual verbal instruction and demonstration of equipment use and safety with use of the equipment.      Cardiac Rehab from 03/02/2015 in [facility] Cardiac Rehab   Date  01/20/15   Educator  D. Joya Gaskins, RN   Instruction Review Code  1- partially meets, needs review/practice      Infection Prevention: - Provides verbal and written material to individual with discussion of infection control including proper hand washing and proper equipment cleaning during exercise session.      Cardiac Rehab from 03/02/2015 in [facility] Cardiac Rehab   Date  01/20/15   Educator  Lunette Stands, RN   Instruction Review Code  2- meets goals/outcomes      Falls Prevention: - Provides verbal and written material to individual with discussion of falls prevention and safety.      Cardiac Rehab from 03/02/2015 in [facility] Cardiac Rehab   Date  01/20/15   Educator  D. Joya Gaskins, RN   Instruction Review Code  1- partially meets, needs review/practice      Diabetes: - Individual verbal and written instruction to review signs/symptoms of diabetes, desired ranges of glucose level fasting, after meals and with exercise. Advice that pre and post exercise glucose checks will be done for 3 sessions at entry of program.      Cardiac Rehab from 03/02/2015 in [facility] Cardiac Rehab   Date  01/20/15   Educator  D. Joya Gaskins, RN   Instruction Review Code  1- partially meets, needs review/practice [Needs to meet wtih dietitian about meal planning]       Knowledge Questionnaire Score:     Knowledge Questionnaire Score - 01/20/15 1735    Knowledge Questionnaire Score   Pre Score 25/28      Personal Goals and Risk Factors at Admission:     Personal Goals and Risk Factors at Admission - 01/20/15 1745    Personal Goals and Risk Factors on Admission    Weight  Management Yes   Intervention Learn and follow the exercise and diet guidelines while in the program. Utilize the nutrition and education classes to help gain knowledge of the diet and exercise expectations in the program   Admit Weight 307 lb 6.4 oz (139.436 kg)   Increase Aerobic Exercise and Physical Activity Yes   Diabetes Yes   Goal Blood glucose control identified by blood glucose values, HgbA1C. Participant verbalizes understanding of the signs/symptoms of hyper/hypo glycemia, proper foot care and importance of medication and nutrition plan for blood glucose control.   Intervention Provide nutrition & aerobic exercise along with prescribed medications to achieve blood glucose in normal ranges: Fasting 65-99 mg/dL   Hypertension Yes   Goal Participant will see blood pressure controlled within the values of 140/2m/Hg or within value directed by their physician.   Intervention Provide nutrition & aerobic exercise along with prescribed medications to achieve BP 140/90 or less.   Lipids Yes   Goal Cholesterol controlled with medications as prescribed, with individualized exercise RX and with personalized nutrition plan. Value goals: LDL < 727m HDL > 4014mParticipant states understanding of desired cholesterol values and following prescriptions.   Intervention Provide nutrition & aerobic exercise along with prescribed medications to achieve LDL <48m59mDL >40mg93mStress Yes   Goal To meet with psychosocial counselor for stress and relaxation information and guidance. To state understanding of performing relaxation techniques and or identifying personal stressors.   Intervention Provide education on types of stress, identifiying stressors, and ways to cope with stress. Provide demonstration and active practice of relaxation techniques.      Personal Goals and Risk Factors Review:      Goals and Risk Factor Review      02/09/15 1020 02/11/15 0857 02/25/15 1141 02/27/15 0943     Weight  Management   Goals Progress/Improvement seen Yes Yes      Comments Patient has been aware of sodium intake and has been reading labels and is more aware of how much sodium he is consuming. Current weight 315.9. Patient has increased vegetable intake and making smarter choices when eating at restaurants even though his  numbers have not improved much on the scale.      Increase Aerobic Exercise and Physical Activity   Goals Progress/Improvement seen   Yes No;Yes Yes    Comments  plans to walk outside and home and want to walk on the treadmill before he graduates.  Not alot of progress due to pain and soreness       Personal Goals Discharge (Final Personal Goals and Risk Factors Review):      Goals and Risk Factor Review - 02/27/15 0943    Increase Aerobic Exercise and Physical Activity   Goals Progress/Improvement seen  Yes   Comments Not alot of progress due to pain and soreness       Comments: 30 day review [place] is currently out for medical concerns. He has kidney stones.

## 2015-03-24 ENCOUNTER — Ambulatory Visit (INDEPENDENT_AMBULATORY_CARE_PROVIDER_SITE_OTHER): Payer: Commercial Managed Care - HMO | Admitting: Urology

## 2015-03-24 ENCOUNTER — Encounter: Payer: Self-pay | Admitting: Urology

## 2015-03-24 VITALS — BP 144/78 | HR 64 | Ht 68.75 in | Wt 312.9 lb

## 2015-03-24 DIAGNOSIS — E291 Testicular hypofunction: Secondary | ICD-10-CM | POA: Diagnosis not present

## 2015-03-24 DIAGNOSIS — R339 Retention of urine, unspecified: Secondary | ICD-10-CM

## 2015-03-24 LAB — BLADDER SCAN AMB NON-IMAGING: Scan Result: 181

## 2015-03-24 NOTE — Progress Notes (Signed)
03/24/2015 1:06 PM   Victor Wise December 25, 1950 [id number]  Referring provider: Lavera Guise, MD [address] [place], [place] [address]  Chief Complaint  Patient presents with  . Urinary Retention    referred by Dr. Dorthey Sawyer    HPI: Patient is a 64 year old white male who was referred to [place] by his primary care physician, Dr. Humphrey Rolls, for urinary retention.  Patient presented to the office visit with a CT of her recent CT scan of the abdomen and pelvis without contrast that he had conducted at [facility]. Patient feels that his urinary symptoms are the result of a kidney stone.   I was unable to view the images on the disk, but the report stated no nephrolithiasis.  He states he is having urgency, dysuria, nocturia, leaking of urine, intermittency, hesitancy and weak urinary stream.  He denied any gross hematuria or suprapubic pain.  He is currently on Flomax.  He stated he is seen a urologist in the past at [facility] and was told he had a neurogenic bladder.  He has had an episode of acute urinary retention in the past.  He also stated that he had been on Axiron in the past, but the medication was discontinued because he had suffered a stroke.  He would like to restart that medication.  His PVR today was 181 mL.  His IPSS score is 27/6.      IPSS      03/24/15 1500       International Prostate Symptom Score   How often have you had the sensation of not emptying your bladder? Less than 1 in 5     How often have you had to urinate less than every two hours? More than half the time     How often have you found you stopped and started again several times when you urinated? Almost always     How often have you found it difficult to postpone urination? Almost always     How often have you had a weak urinary stream? Almost always     How often have you had to strain to start urination? More than half the time     How many times did you typically get up at night to urinate? 3 Times     Total IPSS  Score 27     Quality of Life due to urinary symptoms   If you were to spend the rest of your life with your urinary condition just the way it is now how would you feel about that? Terrible        Score:  1-7 Mild 8-19 Moderate 20-35 Severe  Patient was very focused on kidney stones and his hypogonadism during the visit. It was hard to redirect him off these topics. I had to explain to him several times that he currently did not have any kidney stones according to the CAT scan performed this October. I also stated that we could not address his hypogonadism without clearance from his neurologist due to his history of strokes.  PMH: Past Medical History  Diagnosis Date  . Diabetes ([place])   . Hypertension   . BPH (benign prostatic hyperplasia)   . 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 Morbid obesity ([place])   . Obstructive sleep apnea   . Chronic back pain   . Depression   . UTI (lower urinary tract infection)   . Status post insertion of spinal cord stimulator   . Heart attack ([place])   .  Stroke ([place])   . Basal cell carcinoma     forehead    Surgical History: Past Surgical History  Procedure Laterality Date  . Pain stimulator    . Right toe amputation    . Cardiac catheterization N/A 12/19/2014    Procedure: Coronary Stent Intervention;  Surgeon: Charolette Forward, MD;  Location: [facility];  Service: Cardiovascular;  Laterality: N/A;  . Cardiac catheterization Left 12/19/2014    Procedure: Left Heart Cath and Coronary Angiography;  Surgeon: Dionisio Wayne, MD;  Location: [facility];  Service: Cardiovascular;  Laterality: Left;    Home Medications:    Medication List       This list is accurate as of: 03/24/15 11:59 PM.  Always use your most recent med list.               amLODipine 10 MG tablet  Commonly known as:  NORVASC  Take 10 mg by mouth daily.     aspirin EC 81 MG tablet  Take 81 mg by mouth daily.     aspirin-acetaminophen-caffeine 250-250-65 MG tablet   Commonly known as:  EXCEDRIN MIGRAINE  Take 2 tablets by mouth every 6 (six) hours as needed for headache.     benazepril 40 MG tablet  Commonly known as:  LOTENSIN  Take 40 mg by mouth 2 (two) times daily.     buPROPion 150 MG 24 hr tablet  Commonly known as:  WELLBUTRIN XL  Take 150 mg by mouth 2 (two) times daily.     busPIRone 5 MG tablet  Commonly known as:  BUSPAR  Take by mouth.     cetirizine 10 MG tablet  Commonly known as:  ZYRTEC  Take 10 mg by mouth daily.     clopidogrel 75 MG tablet  Commonly known as:  PLAVIX  Take 75 mg by mouth daily.     DULoxetine 30 MG capsule  Commonly known as:  CYMBALTA  Take 30-60 mg by mouth 2 (two) times daily. 60 mg every morning and 30 mg every evening.     DULoxetine 30 MG capsule  Commonly known as:  CYMBALTA  Take 90 mg by mouth daily.     fenofibrate 54 MG tablet  Take 54 mg by mouth every evening.     fexofenadine 180 MG tablet  Commonly known as:  ALLEGRA  Take 180 mg by mouth daily.     fluticasone 50 MCG/ACT nasal spray  Commonly known as:  FLONASE  Place 2 sprays into both nostrils daily.     glipiZIDE 5 MG tablet  Commonly known as:  GLUCOTROL     glipiZIDE-metformin 5-500 MG tablet  Commonly known as:  METAGLIP  Take 2 tablets by mouth 2 (two) times daily.     hydrochlorothiazide 12.5 MG capsule  Commonly known as:  MICROZIDE  Take 12.5 mg by mouth daily.     LANTUS SOLOSTAR 100 UNIT/ML Solostar Pen  Generic drug:  Insulin Glargine  Inject 41 Units into the skin at bedtime.     levofloxacin 750 MG tablet  Commonly known as:  LEVAQUIN  Take 1 tablet (750 mg total) by mouth daily.     LORazepam 0.5 MG tablet  Commonly known as:  ATIVAN     MEDI-PATCH-LIDOCAINE EX  Apply topically.     metaxalone 800 MG tablet  Commonly known as:  SKELAXIN  Take 800 mg by mouth 3 (three) times daily. Take half tab orally prn  metFORMIN 500 MG tablet  Commonly known as:  GLUCOPHAGE     metoprolol  tartrate 25 MG tablet  Commonly known as:  LOPRESSOR  Take 0.5 tablets (12.5 mg total) by mouth 2 (two) times daily.     mometasone 50 MCG/ACT nasal spray  Commonly known as:  NASONEX  Place 2 sprays into the nose daily.     montelukast 10 MG tablet  Commonly known as:  SINGULAIR  Take 10 mg by mouth daily.     NARCAN 4 MG/0.1ML Liqd  Generic drug:  Naloxone HCl     nitroGLYCERIN 0.4 MG SL tablet  Commonly known as:  NITROSTAT  Place 1 tablet (0.4 mg total) under the tongue every 5 (five) minutes x 3 doses as needed for chest pain.     NOVOLOG FLEXPEN 100 UNIT/ML FlexPen  Generic drug:  insulin aspart  Inject 0-15 Units into the skin 3 (three) times daily with meals as needed for high blood sugar.     Omega-3 Krill Oil 300 MG Caps  Take 1 capsule by mouth daily.     Oxcarbazepine 300 MG tablet  Commonly known as:  TRILEPTAL  Take 300 mg by mouth 2 (two) times daily.     OXYCONTIN 20 mg 12 hr tablet  Generic drug:  oxyCODONE  Take 20 mg by mouth 3 (three) times daily.     oxyCODONE 5 MG immediate release tablet  Commonly known as:  Oxy IR/ROXICODONE  Take 5 mg by mouth 3 (three) times daily as needed for moderate pain.     pantoprazole 40 MG tablet  Commonly known as:  PROTONIX  Take 40 mg by mouth daily.     pregabalin 200 MG capsule  Commonly known as:  LYRICA  Take 200 mg by mouth 3 (three) times daily.     tamsulosin 0.4 MG Caps capsule  Commonly known as:  FLOMAX  Take 0.4 mg by mouth daily.     VICTOZA 18 MG/3ML Sopn  Generic drug:  Liraglutide  Inject 0.6 mg into the skin every evening.        Allergies:  Allergies  Allergen Reactions  . Amoxicillin Shortness Of Breath and Itching    ENT tested pt for allergy.  Pt has not had the med yet.  . Other Other (See Comments)    Uncoded Allergy. Allergen: ENDOPA, Other Reaction: HEART/STO"P "Meta Bisulfates" used as IV saline preservative causes itching  . Hydralazine Other (See Comments)    Cramping to  all extremities; Pt can tolerate 12.5 mg; 25 mg causes arms and legs to "lock up"  . Crestor [Rosuvastatin Calcium] Other (See Comments)    Causes arms and legs to "lock up"; extremely elevated BP  . Metoprolol Nausea Only  . Rosuvastatin Other (See Comments)    Couldn't move arms and legs  . Statins Other (See Comments)  . Sulfa Antibiotics Itching and Other (See Comments)  . Yellow Dyes (Non-Tartrazine) Itching  . Aspirin Itching    Large doses, can tolerate the 81 mg regimen  . Penicillins Itching    Dark blue band at injection site  . Tape Rash    Please use "paper" tape only.    Family History: Family History  Problem Relation Age of Onset  . Breast cancer Mother   . Lung cancer Father   . Kidney disease Sister   . Prostate cancer Neg Hx     Social History:  reports that he has quit smoking. His smoking use included  Cigarettes. He does not have any smokeless tobacco history on file. He reports that he drinks alcohol. He reports that he does not use illicit drugs.  ROS: UROLOGY Frequent Urination?: No Hard to postpone urination?: Yes Burning/pain with urination?: Yes Get up at night to urinate?: Yes Leakage of urine?: Yes Urine stream starts and stops?: Yes Trouble starting stream?: Yes Do you have to strain to urinate?: No Blood in urine?: No Urinary tract infection?: No Sexually transmitted disease?: No Injury to kidneys or bladder?: No Painful intercourse?: No Weak stream?: Yes Erection problems?: Yes Penile pain?: No  Gastrointestinal Nausea?: No Vomiting?: No Indigestion/heartburn?: No Diarrhea?: No Constipation?: No  Constitutional Fever: No Night sweats?: No Weight loss?: Yes Fatigue?: Yes  Skin Skin rash/lesions?: No Itching?: No  Eyes Blurred vision?: No Double vision?: No  Ears/Nose/Throat Sore throat?: No Sinus problems?: Yes  Hematologic/Lymphatic Swollen glands?: No Easy bruising?: No  Cardiovascular Leg swelling?:  No Chest pain?: No  Respiratory Cough?: No Shortness of breath?: Yes  Endocrine Excessive thirst?: Yes  Musculoskeletal Back pain?: Yes Joint pain?: No  Neurological Headaches?: Yes Dizziness?: No  Psychologic Depression?: Yes Anxiety?: Yes  Physical Exam: BP 144/78 mmHg  Pulse 64  Ht 5' 8.75" (1.746 m)  Wt 312 lb 14.4 oz (141.931 kg)  BMI 46.56 kg/m2  Constitutional: Well nourished. Alert and oriented, No acute distress. HEENT: [place] AT, moist mucus membranes. Trachea midline, no masses. Cardiovascular: No clubbing, cyanosis, or edema. Respiratory: Normal respiratory effort, no increased work of breathing. GI: Abdomen is obese. Liver and spleen not palpable.  No hernias appreciated.  Stool sample for occult testing is not indicated.   GU: No CVA tenderness.  No bladder fullness or masses.  Patient with uncircumcised phallus. Foreskin could not be retracted  Urethral meatus is patent.  No penile discharge. No penile lesions or rashes. Scrotum without lesions, cysts, rashes and/or edema.  Testicles are located scrotally bilaterally. No masses are appreciated in the testicles. Left and right epididymis are normal. Rectal: Patient with  normal sphincter tone. Anus and perineum without scarring or rashes. No rectal masses are appreciated. Prostate is approximately 35 grams, no nodules are appreciated. Seminal vesicles are normal. Skin: No rashes, bruises or suspicious lesions. Lymph: No cervical or inguinal adenopathy. Neurologic: Grossly intact, no focal deficits, moving all 4 extremities. Psychiatric: Normal mood and affect.  Laboratory Data: Lab Results  Component Value Date   WBC 3.5* 12/21/2014   HGB 9.4* 12/21/2014   HCT 29.0* 12/21/2014   MCV 79.7 12/21/2014   PLT 105* 12/21/2014    Lab Results  Component Value Date   CREATININE 1.32* 12/21/2014    Lab Results  Component Value Date   HGBA1C 7.8* 12/19/2014      Pertinent Imaging: Results for CILLIAN, GWINNER (MRN [id number]) as of 03/25/2015 12:55  Ref. Range 03/24/2015 15:39  Scan Result Unknown 181 mL    Assessment & Plan:    1. Urinary retention:   Patient was found to have a very small prostate on exam. He is currently taking tamsulosin. His residual is 181 mL and he has significant symptoms according to his IPSS score.  I explained to him the definition of neurogenic bladder and that due to his diabetes, history of stroke and the use of chronic pain medications his bladder is not able to empty on its own.  He was agreeable to [facility].  He demonstrated proficiently in the office his ability to cath himself.    - PSA -  BLADDER SCAN AMB NON-IMAGING  2. Hypogonadism:  Patient states he has a history of hypogonadism.  I stated I would need approval from his neurologist because of his stroke history to start any medication.  He may come in sometime early this week for a serum testosterone level to confirm it is low, but I reiterated that I needed approval from his neurologist.    Return for pending labs.  Zara Council, [facility] [address], [place], [place] [address] [phone]

## 2015-03-25 ENCOUNTER — Telehealth: Payer: Self-pay

## 2015-03-25 ENCOUNTER — Other Ambulatory Visit: Payer: Self-pay | Admitting: *Deleted

## 2015-03-25 DIAGNOSIS — R972 Elevated prostate specific antigen [PSA]: Secondary | ICD-10-CM

## 2015-03-25 DIAGNOSIS — E291 Testicular hypofunction: Secondary | ICD-10-CM | POA: Insufficient documentation

## 2015-03-25 DIAGNOSIS — R339 Retention of urine, unspecified: Secondary | ICD-10-CM | POA: Insufficient documentation

## 2015-03-25 DIAGNOSIS — Z9861 Coronary angioplasty status: Secondary | ICD-10-CM

## 2015-03-25 DIAGNOSIS — I214 Non-ST elevation (NSTEMI) myocardial infarction: Secondary | ICD-10-CM

## 2015-03-25 LAB — PSA: PROSTATE SPECIFIC AG, SERUM: 0.5 ng/mL (ref 0.0–4.0)

## 2015-03-25 NOTE — Telephone Encounter (Signed)
-----   Message from Nori Riis, PA-C sent at 03/25/2015  8:37 AM EST ----- Patient's PSA is stable.  We will see him in 12 months.  PSA to be drawn before his next appointment.

## 2015-03-25 NOTE — Telephone Encounter (Signed)
Spoke with pt in reference to PSA. Pt voiced understanding.  

## 2015-03-26 ENCOUNTER — Other Ambulatory Visit: Payer: Commercial Managed Care - HMO

## 2015-03-26 DIAGNOSIS — E291 Testicular hypofunction: Secondary | ICD-10-CM | POA: Diagnosis not present

## 2015-03-27 ENCOUNTER — Telehealth: Payer: Self-pay

## 2015-03-27 ENCOUNTER — Encounter: Payer: Commercial Managed Care - HMO | Admitting: *Deleted

## 2015-03-27 DIAGNOSIS — I214 Non-ST elevation (NSTEMI) myocardial infarction: Secondary | ICD-10-CM

## 2015-03-27 DIAGNOSIS — Z9889 Other specified postprocedural states: Secondary | ICD-10-CM | POA: Diagnosis not present

## 2015-03-27 DIAGNOSIS — Z9861 Coronary angioplasty status: Secondary | ICD-10-CM

## 2015-03-27 LAB — TESTOSTERONE: Testosterone: 138 ng/dL — ABNORMAL LOW (ref 348–1197)

## 2015-03-27 NOTE — Progress Notes (Signed)
Daily Session Note  Patient Details  Name: KREGG CIHLAR MRN: [id number] Date of Birth: May 09, 1951 Referring Provider:  Corey Skains, MD  Encounter Date: 03/27/2015  Check In:     Session Check In - 03/27/15 0928    Check-In   Staff Present Heath Lark RN, BSN, CCRP;Renee Dillard Essex MS, ACSM CEP Exercise Physiologist;Carroll Enterkin RN, BSN   ER physicians immediately available to respond to emergencies See telemetry face sheet for immediately available ER MD   Medication changes reported     No   Fall or balance concerns reported    No   Warm-up and Cool-down Performed on first and last piece of equipment   VAD Patient? No   Pain Assessment   Currently in Pain? No/denies         Goals Met:  Independence with exercise equipment Exercise tolerated well No report of cardiac concerns or symptoms No weights today, just back agter kidney stone.  Goals Unmet:  Not Applicable  Goals Comments:     Dr. Emily Filbert is Medical Director for [facility] and [facility].

## 2015-03-27 NOTE — Telephone Encounter (Signed)
Patient advised.  A second lab draw is scheduled for Tuesday AM.  I reminded of the need for his clearance.

## 2015-03-27 NOTE — Telephone Encounter (Signed)
-----   Message from Nori Riis, PA-C sent at 03/27/2015  8:32 AM EST ----- Patient's serum testosterone is low.  He will need a second serum testosterone before 9 AM to confirm the diagnosis of hypogonadism.  Reminded the patient, I will need clearance from his neurologist before we can start any testosterone treatment.

## 2015-03-27 NOTE — Telephone Encounter (Signed)
LMOM

## 2015-03-27 NOTE — Telephone Encounter (Signed)
Spoke with pt in reference to testosterone results. Pt stated that he goes to neurologist in Dec. And will obtain clearance then. Made pt aware we would need clearance sooner if he chooses to get tx now. Pt voiced understanding.

## 2015-03-31 ENCOUNTER — Other Ambulatory Visit: Payer: Commercial Managed Care - HMO

## 2015-03-31 DIAGNOSIS — E291 Testicular hypofunction: Secondary | ICD-10-CM

## 2015-04-01 ENCOUNTER — Encounter: Payer: Commercial Managed Care - HMO | Admitting: *Deleted

## 2015-04-01 DIAGNOSIS — I214 Non-ST elevation (NSTEMI) myocardial infarction: Secondary | ICD-10-CM | POA: Diagnosis not present

## 2015-04-01 DIAGNOSIS — Z9889 Other specified postprocedural states: Secondary | ICD-10-CM | POA: Diagnosis not present

## 2015-04-01 LAB — TESTOSTERONE: Testosterone: 168 ng/dL — ABNORMAL LOW (ref 348–1197)

## 2015-04-01 NOTE — Progress Notes (Signed)
Daily Session Note  Patient Details  Name: PRAKASH KIMBERLING MRN: [id number] Date of Birth: 1950/05/22 Referring Provider:  Corey Skains, MD  Encounter Date: 04/01/2015  Check In:     Session Check In - 04/01/15 0834    Check-In   Staff Present Gerlene Burdock RN, BSN;Renee Dillard [place] MS, ACSM CEP Exercise Physiologist;Steven Way BS, ACSM EP-C, Exercise Physiologist   ER physicians immediately available to respond to emergencies See telemetry face sheet for immediately available ER MD   Medication changes reported     No   Fall or balance concerns reported    No   Warm-up and Cool-down Performed on first and last piece of equipment   VAD Patient? No   Pain Assessment   Currently in Pain? No/denies   Multiple Pain Sites No         Goals Met:  Proper associated with RPD/PD & O2 Sat Personal goals reviewed  Goals Unmet:  Not Applicable  Goals Comments: Side pain is limiting him.    Dr. Emily Filbert is Medical Director for [facility] and [facility].

## 2015-04-01 NOTE — Progress Notes (Signed)
Daily Session Note  Patient Details  Name: [name] MRN: [id number] Date of Birth: 04-Aug-1950 Referring Provider:  Corey Skains, MD  Encounter Date: 04/01/2015  Check In:     Session Check In - 04/01/15 0834    Check-In   Staff Present Gerlene Burdock RN, BSN;Kolden Dupee Dillard Essex MS, ACSM CEP Exercise Physiologist;Steven Way BS, ACSM EP-C, Exercise Physiologist   ER physicians immediately available to respond to emergencies See telemetry face sheet for immediately available ER MD   Medication changes reported     No   Fall or balance concerns reported    No   Warm-up and Cool-down Performed on first and last piece of equipment   VAD Patient? No   Pain Assessment   Currently in Pain? No/denies   Multiple Pain Sites No         Goals Met:  Independence with exercise equipment Exercise tolerated well No report of cardiac concerns or symptoms Strength training completed today  Goals Unmet:  Not Applicable  Goals Comments: Patient completed exercise prescription and all exercise goals during rehab session. The exercise was tolerated well and the patient is progressing in the program.    Dr. Emily Filbert is Medical Director for [facility] and [facility].

## 2015-04-02 ENCOUNTER — Telehealth: Payer: Self-pay

## 2015-04-02 NOTE — Telephone Encounter (Signed)
Spoke with pt in reference to testosterone levels. Pt stated that he currently has some axiron left from several years ago. Nurse made pt aware [facility] is not advising him to use that medication and it would be his choice if he does. Pt voiced understanding. Nurse also made pt aware if he wants to treat his testosterone prior to his neuro appt we still need a clearance letter. Pt voiced understanding stating he would try and get a letter.

## 2015-04-02 NOTE — Telephone Encounter (Signed)
-----  Message from Nori Riis, PA-C sent at 04/01/2015 11:52 AM EST ----- Patient has met the criteria for low T, but I still need clearance to start the testosterone therapy from his neurologist.  This is because of his history of strokes.

## 2015-04-03 ENCOUNTER — Encounter: Payer: Commercial Managed Care - HMO | Admitting: *Deleted

## 2015-04-03 DIAGNOSIS — Z9889 Other specified postprocedural states: Secondary | ICD-10-CM | POA: Diagnosis not present

## 2015-04-03 DIAGNOSIS — I214 Non-ST elevation (NSTEMI) myocardial infarction: Secondary | ICD-10-CM | POA: Diagnosis not present

## 2015-04-03 NOTE — Progress Notes (Signed)
Daily Session Note  Patient Details  Name: Victor Wise MRN: [id number] Date of Birth: 1951/03/12 Referring Provider:  Corey Skains, MD  Encounter Date: 04/03/2015  Check In:     Session Check In - 04/03/15 0950    Check-In   Staff Present Gerlene Burdock RN, BSN;Odell Choung Dillard Essex MS, ACSM CEP Exercise Physiologist;Susanne Bice RN, BSN, [place]   ER physicians immediately available to respond to emergencies See telemetry face sheet for immediately available ER MD   Medication changes reported     No   Fall or balance concerns reported    No   Warm-up and Cool-down Performed on first and last piece of equipment   VAD Patient? No   Pain Assessment   Currently in Pain? No/denies   Multiple Pain Sites No         Goals Met:  Independence with exercise equipment Exercise tolerated well No report of cardiac concerns or symptoms Strength training completed today  Goals Unmet:  Not Applicable  Goals Comments: Victor Wise did light exercise today due to his chronic pain. He met with an RN for an extended time during class to discuss solutions to his pain and next steps.   Dr. Emily Filbert is Medical Director for [facility] and [facility].

## 2015-04-06 DIAGNOSIS — I214 Non-ST elevation (NSTEMI) myocardial infarction: Secondary | ICD-10-CM

## 2015-04-06 DIAGNOSIS — Z9889 Other specified postprocedural states: Secondary | ICD-10-CM | POA: Diagnosis not present

## 2015-04-06 NOTE — Progress Notes (Signed)
Daily Session Note  Patient Details  Name: Victor Wise MRN: [id number] Date of Birth: 1950-11-21 Referring Provider:  Corey Skains, MD  Encounter Date: 04/06/2015  Check In:     Session Check In - 04/06/15 0816    Check-In   Staff Present Heath Lark RN, BSN, CCRP;Kelly Hayes BS, ACSM CEP Exercise Physiologist;Kennede Lusk BS, ACSM EP-C, Exercise Physiologist   ER physicians immediately available to respond to emergencies See telemetry face sheet for immediately available ER MD   Medication changes reported     No   Fall or balance concerns reported    No   Warm-up and Cool-down Performed on first and last piece of equipment   VAD Patient? No   Pain Assessment   Currently in Pain? No/denies         Goals Met:  Proper associated with RPD/PD & O2 Sat Exercise tolerated well No report of cardiac concerns or symptoms Strength training completed today  Goals Unmet:  Not Applicable  Goals Comments:    Dr. Emily Filbert is Medical Director for [facility] and [facility].

## 2015-04-07 ENCOUNTER — Encounter: Payer: Self-pay | Admitting: *Deleted

## 2015-04-07 NOTE — Progress Notes (Signed)
Cardiac Individual Treatment Plan  Patient Details  Name: Victor Wise MRN: [id number] Date of Birth: [birth year] Referring Provider:  Mauri Reading  Visit Diagnosis: NSTEMI Patient's Home Medications on Admission:  Current outpatient prescriptions:  .  amLODipine (NORVASC) 10 MG tablet, Take 10 mg by mouth daily., Disp: , Rfl:  .  aspirin EC 81 MG tablet, Take 81 mg by mouth daily., Disp: , Rfl:  .  aspirin-acetaminophen-caffeine (EXCEDRIN MIGRAINE) 250-250-65 MG per tablet, Take 2 tablets by mouth every 6 (six) hours as needed for headache., Disp: , Rfl:  .  benazepril (LOTENSIN) 40 MG tablet, Take 40 mg by mouth 2 (two) times daily. , Disp: , Rfl:  .  buPROPion (WELLBUTRIN XL) 150 MG 24 hr tablet, Take 150 mg by mouth 2 (two) times daily., Disp: , Rfl:  .  busPIRone (BUSPAR) 5 MG tablet, Take by mouth., Disp: , Rfl:  .  cetirizine (ZYRTEC) 10 MG tablet, Take 10 mg by mouth daily., Disp: , Rfl:  .  clopidogrel (PLAVIX) 75 MG tablet, Take 75 mg by mouth daily., Disp: , Rfl:  .  DULoxetine (CYMBALTA) 30 MG capsule, Take 30-60 mg by mouth 2 (two) times daily. 60 mg every morning and 30 mg every evening., Disp: , Rfl:  .  DULoxetine (CYMBALTA) 30 MG capsule, Take 90 mg by mouth daily., Disp: , Rfl:  .  fenofibrate 54 MG tablet, Take 54 mg by mouth every evening., Disp: , Rfl:  .  fexofenadine (ALLEGRA) 180 MG tablet, Take 180 mg by mouth daily., Disp: , Rfl:  .  fluticasone (FLONASE) 50 MCG/ACT nasal spray, Place 2 sprays into both nostrils daily., Disp: , Rfl:  .  glipiZIDE (GLUCOTROL) 5 MG tablet, , Disp: , Rfl:  .  glipiZIDE-metformin (METAGLIP) 5-500 MG per tablet, Take 2 tablets by mouth 2 (two) times daily., Disp: , Rfl:  .  hydrochlorothiazide (MICROZIDE) 12.5 MG capsule, Take 12.5 mg by mouth daily., Disp: , Rfl:  .  insulin aspart (NOVOLOG FLEXPEN) 100 UNIT/ML FlexPen, Inject 0-15 Units into the skin 3 (three) times daily with meals as needed for high blood sugar. , Disp: , Rfl:  .   Insulin Glargine (LANTUS SOLOSTAR) 100 UNIT/ML Solostar Pen, Inject 41 Units into the skin at bedtime., Disp: , Rfl:  .  levofloxacin (LEVAQUIN) 750 MG tablet, Take 1 tablet (750 mg total) by mouth daily. (Patient not taking: Reported on 01/20/2015), Disp: 3 tablet, Rfl: 0 .  Lido-Capsaicin-Men-Methyl Sal (MEDI-PATCH-LIDOCAINE EX), Apply topically., Disp: , Rfl:  .  Liraglutide (VICTOZA) 18 MG/3ML SOPN, Inject 0.6 mg into the skin every evening. , Disp: , Rfl:  .  LORazepam (ATIVAN) 0.5 MG tablet, , Disp: , Rfl:  .  metaxalone (SKELAXIN) 800 MG tablet, Take 800 mg by mouth 3 (three) times daily. Take half tab orally prn, Disp: , Rfl:  .  metFORMIN (GLUCOPHAGE) 500 MG tablet, , Disp: , Rfl:  .  metoprolol tartrate (LOPRESSOR) 25 MG tablet, Take 0.5 tablets (12.5 mg total) by mouth 2 (two) times daily. (Patient not taking: Reported on 03/24/2015), Disp: 30 tablet, Rfl: 1 .  mometasone (NASONEX) 50 MCG/ACT nasal spray, Place 2 sprays into the nose daily., Disp: , Rfl:  .  montelukast (SINGULAIR) 10 MG tablet, Take 10 mg by mouth daily., Disp: , Rfl:  .  NARCAN 4 MG/0.1ML LIQD, , Disp: , Rfl:  .  nitroGLYCERIN (NITROSTAT) 0.4 MG SL tablet, Place 1 tablet (0.4 mg total) under the tongue every 5 (five) minutes  x 3 doses as needed for chest pain., Disp: 25 tablet, Rfl: 1 .  Omega-3 Krill Oil 300 MG CAPS, Take 1 capsule by mouth daily., Disp: , Rfl:  .  Oxcarbazepine (TRILEPTAL) 300 MG tablet, Take 300 mg by mouth 2 (two) times daily. , Disp: , Rfl:  .  oxyCODONE (OXY IR/ROXICODONE) 5 MG immediate release tablet, Take 5 mg by mouth 3 (three) times daily as needed for moderate pain. , Disp: , Rfl:  .  OxyCODONE (OXYCONTIN) 20 mg T12A 12 hr tablet, Take 20 mg by mouth 3 (three) times daily., Disp: , Rfl:  .  pantoprazole (PROTONIX) 40 MG tablet, Take 40 mg by mouth daily., Disp: , Rfl:  .  pregabalin (LYRICA) 200 MG capsule, Take 200 mg by mouth 3 (three) times daily., Disp: , Rfl:  .  tamsulosin (FLOMAX) 0.4  MG CAPS capsule, Take 0.4 mg by mouth daily., Disp: , Rfl:   Past Medical History: Past Medical History  Diagnosis Date  . Diabetes ([place])   . Hypertension   . BPH (benign prostatic hyperplasia)   . 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 Morbid obesity ([place])   . Obstructive sleep apnea   . Chronic back pain   . Depression   . UTI (lower urinary tract infection)   . Status post insertion of spinal cord stimulator   . Heart attack ([place])   . Stroke ([place])   . Basal cell carcinoma     forehead    Tobacco Use: History  Smoking status  . Former Smoker  . Types: Cigarettes  Smokeless tobacco  . Not on file    Comment: quit 45 years    Labs: Recent Review Flowsheet Data    Labs for ITP Cardiac and Pulmonary Rehab Latest Ref Rng 08/30/2013 12/19/2014 12/20/2014   Cholestrol 0 - 200 mg/dL 180 - 147   LDLCALC 0 - 99 mg/dL SEE COMMENT - UNABLE TO CALCULATE IF TRIGLYCERIDE OVER 400 mg/dL   HDL >40 mg/dL 27(L) - 14(L)   Trlycerides <150 mg/dL 465(H) - 512(H)   Hemoglobin A1c 4.8 - 5.6 % - 7.8(H) -       Exercise Target Goals:    Exercise Program Goal: Individual exercise prescription set with THRR, safety & activity barriers. Participant demonstrates ability to understand and report RPE using BORG scale, to self-measure pulse accurately, and to acknowledge the importance of the exercise prescription.  Exercise Prescription Goal: Starting with aerobic activity 30 plus minutes a day, 3 days per week for initial exercise prescription. Provide home exercise prescription and guidelines that participant acknowledges understanding prior to discharge.  Activity Barriers & Risk Stratification:     Activity Barriers & Risk Stratification - 01/20/15 1754    Activity Barriers & Risk Stratification   Activity Barriers Balance Concerns;History of Falls;Neck/Spine Problems;Back Problems;Deconditioning;Muscular Weakness;Assistive Device;Joint Problems      6 Minute Walk:     6 Minute Walk       01/20/15 1513       6 Minute Walk   Phase Initial     Distance 470 feet     Walk Time 4.5 minutes     Resting HR 75 bpm     Resting BP 100/60 mmHg     Max Ex. HR 113 bpm     Max Ex. BP 132/60 mmHg     RPE 17     Symptoms No        Initial Exercise Prescription:     Initial Exercise  Prescription - 01/20/15 1500    Date of Initial Exercise Prescription   Date 01/20/15   Treadmill   MPH 0.8  Use intervals to complete time goals   Grade 0   Minutes 5   Bike   Level 0.2   Minutes 10   Recumbant Bike   Level 2   RPM 40   Watts 20   Minutes 15   NuStep   Level 2   Watts 30   Minutes 10   Arm Ergometer   Level 1   Watts 8   Minutes 10   Arm/Foot Ergometer   Level 4   Watts 12   Minutes 10   Cybex   Level 1   RPM 50   Minutes 10   Recumbant Elliptical   Level 1   RPM 40   Watts 10   Minutes 10   Elliptical   Level 1   Speed 3   Minutes 1   REL-XR   Level 1   Watts 25   Minutes 10   Prescription Details   Frequency (times per week) 3   Duration Progress to 30 minutes of continuous aerobic without signs/symptoms of physical distress   Intensity   THRR REST +  30   Ratings of Perceived Exertion 11-15   Progression Continue progressive overload as per policy without signs/symptoms or physical distress.   Resistance Training   Training Prescription Yes   Weight 2   Reps 10-15      Exercise Prescription Changes:     Exercise Prescription Changes      01/27/15 0600 02/04/15 0800 02/11/15 0900 02/17/15 0700 03/16/15 1000   Exercise Review   Progression Yes Yes Yes Yes No  Jaquaveon has been absent since 03/02/15 due to kidney stones.    Response to Exercise   Blood Pressure (Admit) 148/82 mmHg   148/80 mmHg 162/84 mmHg   Blood Pressure (Exercise) 146/82 mmHg   144/80 mmHg 144/80 mmHg   Blood Pressure (Exit) 136/84 mmHg   140/88 mmHg 130/74 mmHg   Heart Rate (Admit) 67 bpm   77 bpm 65 bpm   Heart Rate (Exercise) 67 bpm   78 bpm 62 bpm   Heart Rate  (Exit) 65 bpm   60 bpm 71 bpm   Rating of Perceived Exertion (Exercise) 11   10 6    Symptoms No   Some soreness from exercise. Pain due to kidney stones which eventually got so bad he could no longer come to class until resolved.   Comments    Emersyn was having some soreness from the NuStep so his level was dropped back and his time was increased. He said this has helped.  Dyshawn plans on returning to class, but no exercise progression will be made until he returns. He may even need to lower workloads and progress back up to where he was before he had to take time off.    Duration Progress to 30 minutes of continuous aerobic without signs/symptoms of physical distress Progress to 30 minutes of continuous aerobic without signs/symptoms of physical distress Progress to 30 minutes of continuous aerobic without signs/symptoms of physical distress Progress to 30 minutes of continuous aerobic without signs/symptoms of physical distress Progress to 30 minutes of continuous aerobic without signs/symptoms of physical distress   Intensity Rest + 30 Rest + 30 Rest + 30 Rest + 30 Rest + 30   Progression Continue progressive overload as per policy without signs/symptoms or physical distress. Continue  progressive overload as per policy without signs/symptoms or physical distress. Continue progressive overload as per policy without signs/symptoms or physical distress. Continue progressive overload as per policy without signs/symptoms or physical distress. Continue progressive overload as per policy without signs/symptoms or physical distress.   Resistance Training   Training Prescription Yes Yes Yes Yes Yes   Weight 2 2 3 3 3    Reps 10-15 10-15 10-15 10-15 10-15   Interval Training   Interval Training No No No No No   NuStep   Level 2 2 6 3 3    Watts 15 15 40 20 20   Minutes 20 20 20 20 20    Recumbant Elliptical   Level  1 4  Bio Step 4  Bio Step 4  Bio Step   RPM  40 40 40 40   Watts  10 25 25 25    Minutes   10 10 10 10       Discharge Exercise Prescription (Final Exercise Prescription Changes):     Exercise Prescription Changes - 03/16/15 1000    Exercise Review   Progression No  Daryus has been absent since 03/02/15 due to kidney stones.    Response to Exercise   Blood Pressure (Admit) 162/84 mmHg   Blood Pressure (Exercise) 144/80 mmHg   Blood Pressure (Exit) 130/74 mmHg   Heart Rate (Admit) 65 bpm   Heart Rate (Exercise) 62 bpm   Heart Rate (Exit) 71 bpm   Rating of Perceived Exertion (Exercise) 6   Symptoms Pain due to kidney stones which eventually got so bad he could no longer come to class until resolved.   Comments Aarin plans on returning to class, but no exercise progression will be made until he returns. He may even need to lower workloads and progress back up to where he was before he had to take time off.    Duration Progress to 30 minutes of continuous aerobic without signs/symptoms of physical distress   Intensity Rest + 30   Progression Continue progressive overload as per policy without signs/symptoms or physical distress.   Resistance Training   Training Prescription Yes   Weight 3   Reps 10-15   Interval Training   Interval Training No   NuStep   Level 3   Watts 20   Minutes 20   Recumbant Elliptical   Level 4  Bio Step   RPM 40   Watts 25   Minutes 10      Nutrition:  Target Goals: Understanding of nutrition guidelines, daily intake of sodium <1555m, cholesterol <2054m calories 30% from fat and 7% or less from saturated fats, daily to have 5 or more servings of fruits and vegetables.  Biometrics:     Pre Biometrics - 01/20/15 1451    Pre Biometrics   Height 5' 8.5" (1.74 m)   Weight (!) 307 lb 6.4 oz (139.436 kg)   Waist Circumference 58.25 inches   Hip Circumference 59 inches   Waist to Hip Ratio 0.99 %   BMI (Calculated) 46.2       Nutrition Therapy Plan and Nutrition Goals:     Nutrition Therapy & Goals - 01/30/15 1635    Nutrition  Therapy   Diet Instructed on a meal plan based on 1800 calories including dietary guidelines for diabetes and heart health.   Fiber 30 grams   Whole Grain Foods 3 servings   Protein 8 ounces/day   Saturated Fats 12 max. grams   Fruits and Vegetables 5 servings/day  Personal Nutrition Goals   Personal Goal #1 Include at least 2 servings of carbohydrate per meal with range of 2-4 servings/meal.   Personal Goal #2 Balance meals with protein, 2-4 servings of carbohydrate and "free vegetables"   Personal Goal #3 Space 3 meals/day 4-5 hours apart   Comments At present, patient does not have a consistant meal plan and typically averages 2 meals per day + snacks.      Nutrition Discharge: Rate Your Plate Scores:     Rate Your Plate - 26/33/35 4562    Rate Your Plate Scores   Post Score 77   Post Score % 85.5 %      Nutrition Goals Re-Evaluation:     Nutrition Goals Re-Evaluation      02/25/15 1140 04/01/15 1116         Personal Goal #1 Re-Evaluation   Personal Goal #1  Shanon Brow said today he lost weight in the past and ate better but all he can focus on now instead of good eating habits  is the pain in his side that urologist wants him to straight cath himself "I am not going to straight cath myself and I am thinking about getting a second opinion at [facility] but need prior approval.       Goal Progress Seen  Yes      Personal Goal #3 Re-Evaluation   Personal Goal #3 Pranish is trying to do this.        Goal Progress Seen Yes          Psychosocial: Target Goals: Acknowledge presence or absence of depression, maximize coping skills, provide positive support system. Participant is able to verbalize types and ability to use techniques and skills needed for reducing stress and depression.  Initial Review & Psychosocial Screening:     Initial Psych Review & Screening - 01/20/15 1746    Initial Review   Current issues with Current Depression;History of Depression;Current Psychotropic  Meds;Current Sleep Concerns   Family Dynamics   Good Support System? Yes   Comments Patient states he lives with his sister Kerry Dory, "who is opinionated and has red hair."  His sister Jeani Hawking and his daughter [name] are his main supporters.  He would like to get stronger and try to move into senior housing.  Divorced, has one daughter and one grand-daughter.  See PHQ-9 score.     Barriers   Psychosocial barriers to participate in program The patient should benefit from training in stress management and relaxation.  Pt has a PHQ-9 score of 24.     Screening Interventions   Interventions Program counselor consult      Quality of Life Scores:     Quality of Life - 04/06/15 1820    Quality of Life Scores   Health/Function Pre 6.23 %   Health/Function Post 13.27 %   Health/Function % Change 113 %   Socioeconomic Pre 10.5 %   Socioeconomic Post 16.25 %   Socioeconomic % Change 55 %   Psych/Spiritual Pre 5.79 %   Psych/Spiritual Post 15.57 %   Psych/Spiritual % Change 169 %   Family Pre 5.9 %   Family Post 12.75 %   Family % Change 169 %   GLOBAL Pre 6.97 %   GLOBAL Post 14.27 %   GLOBAL % Change 105 %      PHQ-9:     Recent Review Flowsheet Data    Depression screen [facility] 2/9 04/03/2015 01/20/2015   Decreased Interest 3 3  Down, Depressed, Hopeless 3 3   PHQ - 2 Score 6 6   Altered sleeping 3 3   Tired, decreased energy 3 3   Change in appetite 3 3   Feeling bad or failure about yourself  0 3   Trouble concentrating 3 3   Moving slowly or fidgety/restless 1 3   Suicidal thoughts 0 0   PHQ-9 Score 19 24   Difficult doing work/chores Extremely dIfficult Extremely dIfficult      Psychosocial Evaluation and Intervention:     Psychosocial Evaluation - 01/26/15 1017    Psychosocial Evaluation & Interventions   Interventions Stress management education;Relaxation education;Encouraged to exercise with the program and follow exercise prescription   Comments Counselor met  with Mr. Lashley today for initial psychosocial evaluation.  He is a 64 year old who reports having multiple health issues, including more recently a heart attack in August.  He has a limited support system as he lives with his sister and brother-in-law and has a daughter close by.  Mr. Lancour reports sleeping "odd hours" with help of a CPAP.  He admits to a history of "clinical depression" and is currently on medications for this and for anxiety.  His PHQ-9 scores were significantly high so counselor questioned efficacy of his medications, with Mr. Viona Gilmore stating they are not very effective.  He admits to multiple stressors, including his health, his living arrangements with his sister and brother-in-law and finances.  His mood was depressed and he engaged in a great deal of negative self-talk, denying suicidal ideations currently.  Therapist recommended seeing a therapist and Mr. Viona Gilmore states he "cannot afford the copay."  Counselor will research local programs to meet this need and educated Mr. Viona Gilmore on the benefit of medications and psychotherapy for clinical depression.  Counselor will follow up regularly with Mr. Viona Gilmore.   Continued Psychosocial Services Needed Yes  Mr. W will benefit from psychotherapy for his clinical depression.  He also will benefit from the psychoeducational components of this program, and meeting with the dietician to address weight loss goals.       Psychosocial Re-Evaluation:     Psychosocial Re-Evaluation      02/18/15 1033 02/25/15 1141 04/01/15 1118 04/03/15 1252 04/07/15 1626   Psychosocial Re-Evaluation   Interventions   Encouraged to attend Cardiac Rehabilitation for the exercise;Stress management education     Comments Counselor provided Mr. Olden with information recently on psychiatrists and therapists locally who are affordable.  He continues to engage in negative self-talk, although today he was more positive, especially when talking about spending time with his granddaughters.  Mr. Viona Gilmore  will benefit from med eval for efficacy and ongoing therapy for his mood.   This was recommended to him by this counselor.  Najae has mulitple stressors incl his sister being in the hospital so he had to miss a few sessions of Cardiac Rehab.  Caelen said today he lost weight in the past and ate better but all he can focus on now instead of good eating habits  is the pain in his side that urologist wants him to straight cath himself "I am not going to straight cath myself and I am thinking about getting a second opinion at [facility] but need prior approval.  Karthik today stated he is done with it. Just wants to make it to Jan 8th. After sitting and listening to [place]. He has been dealing with a chronic illness over 20 years. He has had multiple physicians and  care givers and multiple ilnnesses in this time. He has barriers in receiving his health care: insurance costs, prescriptionsand the  cost through his insurance, ability to  easily get referrals, ablity to access care he feels may be of assistance to him , barriers are there because of Limitations set by is [employer] or the clinic providing his care. Jaelyn agreed he is totally frustrated and has reached the limit of trying to help himself. He did state he is not suicidal when asked. He has agreed to call his primary caregiver and to sit down and talk to her about his frustrations and concerns about the overall care he is receiving. He will ask this caregiver to help him advocate for the items he needs to decrease his frustration.  He wants his medical team to listen to him and  care for him now.  He wants to afford his medications and he wants to seek other sources of treatment to help with his chronic pain other than more pain medicine, because he is not able to stay awake with the doses that are currently prescribed. Lazer has grandchildren he cares for and he wants to be able to enjoy his time with his grandchildren. Called Aijalon's primary care office to  review Koven's state of mind and concerns. The nurse I spoke with will inform his primary practioner of the concerns and will see if they can bring Shanon Brow into the office sooner than his next appointment of 04/28/15.   Continued Psychosocial Services Needed  Yes Yes        Vocational Rehabilitation: Provide vocational rehab assistance to qualifying candidates.   Vocational Rehab Evaluation & Intervention:     Vocational Rehab - 01/20/15 1737    Initial Vocational Rehab Evaluation & Intervention   Assessment shows need for Vocational Rehabilitation No      Education: Education Goals: Education classes will be provided on a weekly basis, covering required topics. Participant will state understanding/return demonstration of topics presented.  Learning Barriers/Preferences:     Learning Barriers/Preferences - 01/20/15 1735    Learning Barriers/Preferences   Learning Barriers Hearing   Learning Preferences Written Material      Education Topics: General Nutrition Guidelines/Fats and Fiber: -Group instruction provided by verbal, written material, models and posters to present the general guidelines for heart healthy nutrition. Gives an explanation and review of dietary fats and fiber.          Cardiac Rehab from 04/06/2015 in [facility] Cardiac Rehab   Date  02/23/15   Educator  PI   Instruction Review Code  2- meets goals/outcomes      Controlling Sodium/Reading Food Labels: -Group verbal and written material supporting the discussion of sodium use in heart healthy nutrition. Review and explanation with models, verbal and written materials for utilization of the food label.   Exercise Physiology & Risk Factors: - Group verbal and written instruction with models to review the exercise physiology of the cardiovascular system and associated critical values. Details cardiovascular disease risk factors and the goals associated with each risk factor.   Aerobic Exercise & Resistance  Training: - Gives group verbal and written discussion on the health impact of inactivity. On the components of aerobic and resistive training programs and the benefits of this training and how to safely progress through these programs.   Flexibility, Balance, General Exercise Guidelines: - Provides group verbal and written instruction on the benefits of flexibility and balance training programs. Provides general exercise guidelines with specific guidelines  to those with heart or lung disease. Demonstration and skill practice provided.      Cardiac Rehab from 04/06/2015 in [facility] Cardiac Rehab   Date  02/02/15   Educator  [facility]   Instruction Review Code  2- meets goals/outcomes      Stress Management: - Provides group verbal and written instruction about the health risks of elevated stress, cause of high stress, and healthy ways to reduce stress.      Cardiac Rehab from 04/06/2015 in [facility] Cardiac Rehab   Date  04/01/15   Educator  [facility]   Instruction Review Code  2- meets goals/outcomes      Depression: - Provides group verbal and written instruction on the correlation between heart/lung disease and depressed mood, treatment options, and the stigmas associated with seeking treatment.   Anatomy & Physiology of the Heart: - Group verbal and written instruction and models provide basic cardiac anatomy and physiology, with the coronary electrical and arterial systems. Review of: AMI, Angina, Valve disease, Heart Failure, Cardiac Arrhythmia, Pacemakers, and the ICD.      Cardiac Rehab from 04/06/2015 in [facility] Cardiac Rehab   Date  04/06/15   Educator  SB   Instruction Review Code  2- meets goals/outcomes      Cardiac Procedures: - Group verbal and written instruction and models to describe the testing methods done to diagnose heart disease. Reviews the outcomes of the test results. Describes the treatment choices: Medical Management, Angioplasty, or Coronary Bypass Surgery.      Cardiac Rehab  from 04/06/2015 in [facility] Cardiac Rehab   Date  03/02/15   Educator  SB   Instruction Review Code  2- meets goals/outcomes      Cardiac Medications: - Group verbal and written instruction to review commonly prescribed medications for heart disease. Reviews the medication, class of the drug, and side effects. Includes the steps to properly store meds and maintain the prescription regimen.   Go Sex-Intimacy & Heart Disease, Get SMART - Goal Setting: - Group verbal and written instruction through game format to discuss heart disease and the return to sexual intimacy. Provides group verbal and written material to discuss and apply goal setting through the application of the S.M.A.R.T. Method.      Cardiac Rehab from 04/06/2015 in [facility] Cardiac Rehab   Date  03/02/15   Educator  SB   Instruction Review Code  2- meets goals/outcomes      Other Matters of the Heart: - Provides group verbal, written materials and models to describe Heart Failure, Angina, Valve Disease, and Diabetes in the realm of heart disease. Includes description of the disease process and treatment options available to the cardiac patient.      Cardiac Rehab from 04/06/2015 in [facility] Cardiac Rehab   Date  02/18/15   Educator  Vesta   Instruction Review Code  2- meets goals/outcomes      Exercise & Equipment Safety: - Individual verbal instruction and demonstration of equipment use and safety with use of the equipment.      Cardiac Rehab from 04/06/2015 in [facility] Cardiac Rehab   Date  01/20/15   Educator  D. Joya Gaskins, RN   Instruction Review Code  1- partially meets, needs review/practice      Infection Prevention: - Provides verbal and written material to individual with discussion of infection control including proper hand washing and proper equipment cleaning during exercise session.      Cardiac Rehab from 04/06/2015 in [facility] Cardiac Rehab  Date  01/20/15   Educator  D. Joya Gaskins, RN   Instruction Review Code  2- meets  goals/outcomes      Falls Prevention: - Provides verbal and written material to individual with discussion of falls prevention and safety.      Cardiac Rehab from 04/06/2015 in [facility] Cardiac Rehab   Date  01/20/15   Educator  D. Joya Gaskins, RN   Instruction Review Code  1- partially meets, needs review/practice      Diabetes: - Individual verbal and written instruction to review signs/symptoms of diabetes, desired ranges of glucose level fasting, after meals and with exercise. Advice that pre and post exercise glucose checks will be done for 3 sessions at entry of program.      Cardiac Rehab from 04/06/2015 in [facility] Cardiac Rehab   Date  01/20/15   Educator  D. Joya Gaskins, RN   Instruction Review Code  1- partially meets, needs review/practice [Needs to meet wtih dietitian about meal planning]       Knowledge Questionnaire Score:     Knowledge Questionnaire Score - 04/03/15 1022    Knowledge Questionnaire Score   Post Score 26      Personal Goals and Risk Factors at Admission:     Personal Goals and Risk Factors at Admission - 01/20/15 1745    Personal Goals and Risk Factors on Admission    Weight Management Yes   Intervention Learn and follow the exercise and diet guidelines while in the program. Utilize the nutrition and education classes to help gain knowledge of the diet and exercise expectations in the program   Admit Weight 307 lb 6.4 oz (139.436 kg)   Increase Aerobic Exercise and Physical Activity Yes   Diabetes Yes   Goal Blood glucose control identified by blood glucose values, HgbA1C. Participant verbalizes understanding of the signs/symptoms of hyper/hypo glycemia, proper foot care and importance of medication and nutrition plan for blood glucose control.   Intervention Provide nutrition & aerobic exercise along with prescribed medications to achieve blood glucose in normal ranges: Fasting 65-99 mg/dL   Hypertension Yes   Goal Participant will see blood pressure  controlled within the values of 140/71m/Hg or within value directed by their physician.   Intervention Provide nutrition & aerobic exercise along with prescribed medications to achieve BP 140/90 or less.   Lipids Yes   Goal Cholesterol controlled with medications as prescribed, with individualized exercise RX and with personalized nutrition plan. Value goals: LDL < 761m HDL > 4071mParticipant states understanding of desired cholesterol values and following prescriptions.   Intervention Provide nutrition & aerobic exercise along with prescribed medications to achieve LDL <48m81mDL >40mg6mStress Yes   Goal To meet with psychosocial counselor for stress and relaxation information and guidance. To state understanding of performing relaxation techniques and or identifying personal stressors.   Intervention Provide education on types of stress, identifiying stressors, and ways to cope with stress. Provide demonstration and active practice of relaxation techniques.      Personal Goals and Risk Factors Review:      Goals and Risk Factor Review      02/09/15 1020 02/11/15 0857 02/25/15 1141 02/27/15 0943 04/01/15 1118   Weight Management   Goals Progress/Improvement seen Yes Yes   No   Comments Patient has been aware of sodium intake and has been reading labels and is more aware of how much sodium he is consuming. Current weight 315.9. Patient has increased vegetable intake and making smarter choices  when eating at restaurants even though his numbers have not improved much on the scale.   Toney said today he lost weight in the past and ate better but all he can focus on now instead of good eating habits  is the pain in his side that urologist wants him to straight cath himself "I am not going to straight cath myself and I am thinking about getting a second opinion at [facility] but need prior approval.    Increase Aerobic Exercise and Physical Activity   Goals Progress/Improvement seen   Yes No;Yes Yes Yes    Comments  plans to walk outside and home and want to walk on the treadmill before he graduates.  Not alot of progress due to pain and soreness Some progress but is limited by his side pain.       Personal Goals Discharge (Final Personal Goals and Risk Factors Review):      Goals and Risk Factor Review - 04/01/15 1118    Weight Management   Goals Progress/Improvement seen No   Comments Caedmon said today he lost weight in the past and ate better but all he can focus on now instead of good eating habits  is the pain in his side that urologist wants him to straight cath himself "I am not going to straight cath myself and I am thinking about getting a second opinion at [facility] but need prior approval.    Increase Aerobic Exercise and Physical Activity   Goals Progress/Improvement seen  Yes   Comments Some progress but is limited by his side pain.       ITP Comments:   Comments: created to send notes to primary caregiver.

## 2015-04-08 DIAGNOSIS — I214 Non-ST elevation (NSTEMI) myocardial infarction: Secondary | ICD-10-CM | POA: Diagnosis not present

## 2015-04-08 DIAGNOSIS — Z9889 Other specified postprocedural states: Secondary | ICD-10-CM | POA: Diagnosis not present

## 2015-04-08 NOTE — Progress Notes (Signed)
Daily Session Note  Patient Details  Name: JOHNAVON MCCLAFFERTY MRN: [id number] Date of Birth: 08-Nov-1950 Referring Provider:  Corey Skains, MD  Encounter Date: 04/08/2015  Check In:     Session Check In - 04/08/15 0905    Check-In   Staff Present Heath Lark, RN, BSN, CCRP;Faryn Sieg, BS, ACSM EP-C, Exercise Physiologist   ER physicians immediately available to respond to emergencies See telemetry face sheet for immediately available ER MD   Medication changes reported     No   Fall or balance concerns reported    No   Warm-up and Cool-down Performed on first and last piece of equipment   VAD Patient? No   Pain Assessment   Currently in Pain? No/denies         Goals Met:  Proper associated with RPD/PD & O2 Sat Exercise tolerated well No report of cardiac concerns or symptoms Strength training completed today  Goals Unmet:  Not Applicable  Goals Comments:    Dr. Emily Filbert is Medical Director for [place] and [facility].

## 2015-04-13 ENCOUNTER — Encounter: Payer: Commercial Managed Care - HMO | Admitting: *Deleted

## 2015-04-13 DIAGNOSIS — I214 Non-ST elevation (NSTEMI) myocardial infarction: Secondary | ICD-10-CM

## 2015-04-13 DIAGNOSIS — Z9861 Coronary angioplasty status: Secondary | ICD-10-CM

## 2015-04-13 DIAGNOSIS — Z9889 Other specified postprocedural states: Secondary | ICD-10-CM | POA: Diagnosis not present

## 2015-04-13 NOTE — Progress Notes (Signed)
Daily Session Note  Patient Details  Name: Victor Wise MRN: [id number] Date of Birth: [birth year] Referring Provider:  Corey Skains, MD  Encounter Date: 04/13/2015  Check In:     Session Check In - 04/13/15 0846    Check-In   Staff Present Candiss Norse, MS, ACSM CEP, Exercise Physiologist;Susanne Bice, RN, BSN, Laveda Norman, BS, ACSM CEP, Exercise Physiologist   ER physicians immediately available to respond to emergencies See telemetry face sheet for immediately available ER MD   Medication changes reported     No   Fall or balance concerns reported    No   Warm-up and Cool-down Performed on first and last piece of equipment   VAD Patient? No   Pain Assessment   Currently in Pain? No/denies   Multiple Pain Sites No         Goals Met:  Independence with exercise equipment Exercise tolerated well No report of cardiac concerns or symptoms Strength training completed today  Goals Unmet:  Not Applicable  Goals Comments: Patient completed exercise prescription and all exercise goals during rehab session. The exercise was tolerated well and the patient is progressing in the program.     Dr. Emily Filbert is Medical Director for [place] and [facility].

## 2015-04-14 DIAGNOSIS — I2510 Atherosclerotic heart disease of native coronary artery without angina pectoris: Secondary | ICD-10-CM | POA: Diagnosis not present

## 2015-04-14 DIAGNOSIS — I63511 Cerebral infarction due to unspecified occlusion or stenosis of right middle cerebral artery: Secondary | ICD-10-CM | POA: Diagnosis not present

## 2015-04-14 DIAGNOSIS — I5032 Chronic diastolic (congestive) heart failure: Secondary | ICD-10-CM | POA: Diagnosis not present

## 2015-04-14 DIAGNOSIS — I10 Essential (primary) hypertension: Secondary | ICD-10-CM | POA: Diagnosis not present

## 2015-04-15 VITALS — Ht 68.5 in | Wt 318.7 lb

## 2015-04-15 DIAGNOSIS — Z9889 Other specified postprocedural states: Secondary | ICD-10-CM | POA: Diagnosis not present

## 2015-04-15 DIAGNOSIS — I214 Non-ST elevation (NSTEMI) myocardial infarction: Secondary | ICD-10-CM | POA: Diagnosis not present

## 2015-04-15 NOTE — Progress Notes (Signed)
Daily Session Note  Patient Details  Name: Victor Wise MRN: [id number] Date of Birth: 1950/07/15 Referring Provider:  Lavera Guise, MD  Encounter Date: 04/15/2015  Check In:     Session Check In - 04/15/15 0818    Check-In   Staff Present Heath Lark, RN, BSN, CCRP;Pauletta Pickney, BS, ACSM EP-C, Exercise Physiologist;Renee Dillard [place], MS, ACSM CEP, Exercise Physiologist   ER physicians immediately available to respond to emergencies See telemetry face sheet for immediately available ER MD   Medication changes reported     No   Fall or balance concerns reported    No   Warm-up and Cool-down Performed on first and last piece of equipment   VAD Patient? No   Pain Assessment   Currently in Pain? No/denies         Goals Met:  Proper associated with RPD/PD & O2 Sat Exercise tolerated well No report of cardiac concerns or symptoms Strength training completed today  Goals Unmet:  Not Applicable  Goals Comments:    Dr. Emily Filbert is Medical Director for [place] [facility].

## 2015-04-17 ENCOUNTER — Encounter: Payer: Commercial Managed Care - HMO | Attending: Internal Medicine | Admitting: *Deleted

## 2015-04-17 DIAGNOSIS — I214 Non-ST elevation (NSTEMI) myocardial infarction: Secondary | ICD-10-CM | POA: Diagnosis not present

## 2015-04-17 DIAGNOSIS — Z9889 Other specified postprocedural states: Secondary | ICD-10-CM | POA: Diagnosis not present

## 2015-04-17 NOTE — Addendum Note (Signed)
Addended by: Lynford Humphrey on: 04/17/2015 08:02 AM   Modules accepted: Orders

## 2015-04-17 NOTE — Progress Notes (Signed)
Discharge Summary  Patient Details  Name: Victor Wise MRN: [id number] Date of Birth: [birth year] Referring Provider:  Corey Skains, MD   Number of Visits: 51  Reason for Discharge:  Patient reached a stable level of exercise. Patient independent in their exercise.  Smoking History:  History  Smoking status  . Former Smoker  . Types: Cigarettes  Smokeless tobacco  . Not on file    Comment: quit 45 years    Diagnosis:  NSTEMI (non-ST elevated myocardial infarction) ([place])  ADL [facility]:   Initial Exercise Prescription:     Initial Exercise Prescription - 01/20/15 1500    Date of Initial Exercise Prescription   Date 01/20/15   Treadmill   MPH 0.8  Use intervals to complete time goals   Grade 0   Minutes 5   Bike   Level 0.2   Minutes 10   Recumbant Bike   Level 2   RPM 40   Watts 20   Minutes 15   NuStep   Level 2   Watts 30   Minutes 10   Arm Ergometer   Level 1   Watts 8   Minutes 10   Arm/Foot Ergometer   Level 4   Watts 12   Minutes 10   Cybex   Level 1   RPM 50   Minutes 10   Recumbant Elliptical   Level 1   RPM 40   Watts 10   Minutes 10   Elliptical   Level 1   Speed 3   Minutes 1   REL-XR   Level 1   Watts 25   Minutes 10   Prescription Details   Frequency (times per week) 3   Duration Progress to 30 minutes of continuous aerobic without signs/symptoms of physical distress   Intensity   THRR REST +  30   Ratings of Perceived Exertion 11-15   Progression Continue progressive overload as per policy without signs/symptoms or physical distress.   Resistance Training   Training Prescription Yes   Weight 2   Reps 10-15      Discharge Exercise Prescription (Final Exercise Prescription Changes):     Exercise Prescription Changes - 04/15/15 0900    Exercise Review   Progression No  No progression due to health issues   Response to Exercise   Blood Pressure (Admit) 152/74 mmHg   Blood Pressure (Exercise) 142/80 mmHg   Blood Pressure (Exit) 140/78 mmHg   Heart Rate (Admit) 73 bpm   Heart Rate (Exercise) 77 bpm   Heart Rate (Exit) 78 bpm   Rating of Perceived Exertion (Exercise) 11   Symptoms Chronic pain. Victor Wise is seeing multiple specialists to try and understand the pain and how to treat it. Until the pain can be managed exercise progression is impossible.   Comments We are enccouraging Victor Wise to try and maintain his current workloads and to make an effort to just keep moving.   Duration Progress to 30 minutes of continuous aerobic without signs/symptoms of physical distress   Intensity Rest + 30   Progression Continue progressive overload as per policy without signs/symptoms or physical distress.   Resistance Training   Training Prescription Yes   Weight 3   Reps 10-15   Interval Training   Interval Training No   NuStep   Level 3   Watts 20   Minutes 20   Recumbant Elliptical   Level 4  Bio Step   RPM 40   Watts 25  Minutes 10   Home Exercise Plan   Plans to continue exercise at [place]:     6 Minute Walk      01/20/15 1513 04/15/15 0920     6 Minute Walk   Phase Initial Discharge    Distance 470 feet 750 feet    Distance % Change  60 %    Walk Time 4.5 minutes 6 minutes    Resting HR 75 bpm 76 bpm    Resting BP 100/60 mmHg 134/82 mmHg    Max Ex. HR 113 bpm 87 bpm    Max Ex. BP 132/60 mmHg 132/74 mmHg    RPE 17 16    Symptoms No No       Psychological, QOL, Others - Outcomes: PHQ 2/9: Depression screen [facility] 2/9 04/03/2015 01/20/2015  Decreased Interest 3 3  Down, Depressed, Hopeless 3 3  PHQ - 2 Score 6 6  Altered sleeping 3 3  Tired, decreased energy 3 3  Change in appetite 3 3  Feeling bad or failure about yourself  0 3  Trouble concentrating 3 3  Moving slowly or fidgety/restless 1 3  Suicidal thoughts 0 0  PHQ-9 Score 19 24  Difficult doing work/chores Extremely dIfficult Extremely dIfficult    Quality of Life:     Quality of  Life - 04/06/15 1820    Quality of Life Scores   Health/Function Pre 6.23 %   Health/Function Post 13.27 %   Health/Function % Change 113 %   Socioeconomic Pre 10.5 %   Socioeconomic Post 16.25 %   Socioeconomic % Change 55 %   Psych/Spiritual Pre 5.79 %   Psych/Spiritual Post 15.57 %   Psych/Spiritual % Change 169 %   Family Pre 5.9 %   Family Post 12.75 %   Family % Change 169 %   GLOBAL Pre 6.97 %   GLOBAL Post 14.27 %   GLOBAL % Change 105 %      Personal Goals: Goals established at orientation with interventions provided to work toward goal.     Personal Goals and Risk Factors at Admission - 01/20/15 1745    Personal Goals and Risk Factors on Admission    Weight Management Yes   Intervention Learn and follow the exercise and diet guidelines while in the program. Utilize the nutrition and education classes to help gain knowledge of the diet and exercise expectations in the program   Admit Weight 307 lb 6.4 oz (139.436 kg)   Increase Aerobic Exercise and Physical Activity Yes   Diabetes Yes   Goal Blood glucose control identified by blood glucose values, HgbA1C. Participant verbalizes understanding of the signs/symptoms of hyper/hypo glycemia, proper foot care and importance of medication and nutrition plan for blood glucose control.   Intervention Provide nutrition & aerobic exercise along with prescribed medications to achieve blood glucose in normal ranges: Fasting 65-99 mg/dL   Hypertension Yes   Goal Participant will see blood pressure controlled within the values of 140/102mm/Hg or within value directed by their physician.   Intervention Provide nutrition & aerobic exercise along with prescribed medications to achieve BP 140/90 or less.   Lipids Yes   Goal Cholesterol controlled with medications as prescribed, with individualized exercise RX and with personalized nutrition plan. Value goals: LDL < 70mg , HDL > 40mg . Participant states understanding of desired cholesterol  values and following prescriptions.   Intervention Provide nutrition & aerobic exercise along with prescribed medications to achieve  LDL 70mg , HDL >40mg .   Stress Yes   Goal To meet with psychosocial counselor for stress and relaxation information and guidance. To state understanding of performing relaxation techniques and or identifying personal stressors.   Intervention Provide education on types of stress, identifiying stressors, and ways to cope with stress. Provide demonstration and active practice of relaxation techniques.       Personal Goals Discharge:     Goals and Risk Factor Review      02/09/15 1020 02/11/15 0857 02/25/15 1141 02/27/15 0943 04/01/15 1118   Weight Management   Goals Progress/Improvement seen Yes Yes   No   Comments Patient has been aware of sodium intake and has been reading labels and is more aware of how much sodium he is consuming. Current weight 315.9. Patient has increased vegetable intake and making smarter choices when eating at restaurants even though his numbers have not improved much on the scale.   Victor Wise said today he lost weight in the past and ate better but all he can focus on now instead of good eating habits  is the pain in his side that urologist wants him to straight cath himself "I am not going to straight cath myself and I am thinking about getting a second opinion at [facility] but need prior approval.    Increase Aerobic Exercise and Physical Activity   Goals Progress/Improvement seen   Yes No;Yes Yes Yes   Comments  plans to walk outside and home and want to walk on the treadmill before he graduates.  Not alot of progress due to pain and soreness Some progress but is limited by his side pain.       Nutrition & Weight - Outcomes:     Pre Biometrics - 01/20/15 1451    Pre Biometrics   Height 5' 8.5" (1.74 m)   Weight (!) 307 lb 6.4 oz (139.436 kg)   Waist Circumference 58.25 inches   Hip Circumference 59 inches   Waist to Hip Ratio 0.99 %    BMI (Calculated) 46.2         Post Biometrics - 04/15/15 0927     Post  Biometrics   Height 5' 8.5" (1.74 m)   Weight (!) 318 lb 11.2 oz (144.561 kg)   Waist Circumference 57 inches   Hip Circumference 55 inches   Waist to Hip Ratio 1.04 %   BMI (Calculated) 47.9      Nutrition:     Nutrition Therapy & Goals - 01/30/15 1635    Nutrition Therapy   Diet Instructed on a meal plan based on 1800 calories including dietary guidelines for diabetes and heart health.   Fiber 30 grams   Whole Grain Foods 3 servings   Protein 8 ounces/day   Saturated Fats 12 max. grams   Fruits and Vegetables 5 servings/day   Personal Nutrition Goals   Personal Goal #1 Include at least 2 servings of carbohydrate per meal with range of 2-4 servings/meal.   Personal Goal #2 Balance meals with protein, 2-4 servings of carbohydrate and "free vegetables"   Personal Goal #3 Space 3 meals/day 4-5 hours apart   Comments At present, patient does not have a consistant meal plan and typically averages 2 meals per day + snacks.      Nutrition Discharge:     Rate Your Plate - [id number]    Rate Your Plate Scores   Post Score 77   Post Score % 85.5 %  Education Questionnaire Score:     Knowledge Questionnaire Score - 04/03/15 1022    Knowledge Questionnaire Score   Post Score 26      Victor Wise plans to continue his exercise with the [employer].

## 2015-04-17 NOTE — Progress Notes (Signed)
Cardiac Individual Treatment Plan  Patient Details  Name: Victor Wise MRN: [id number] Date of Birth: 1950-12-20 Referring Provider:  Corey Skains, MD  Initial Encounter Date:    Visit Diagnosis: NSTEMI (non-ST elevated myocardial infarction) [facility])  Patient's Home Medications on Admission:  Current outpatient prescriptions:  .  amLODipine (NORVASC) 10 MG tablet, Take 10 mg by mouth daily., Disp: , Rfl:  .  aspirin EC 81 MG tablet, Take 81 mg by mouth daily., Disp: , Rfl:  .  aspirin-acetaminophen-caffeine (EXCEDRIN MIGRAINE) 250-250-65 MG per tablet, Take 2 tablets by mouth every 6 (six) hours as needed for headache., Disp: , Rfl:  .  benazepril (LOTENSIN) 40 MG tablet, Take 40 mg by mouth 2 (two) times daily. , Disp: , Rfl:  .  buPROPion (WELLBUTRIN XL) 150 MG 24 hr tablet, Take 150 mg by mouth 2 (two) times daily., Disp: , Rfl:  .  busPIRone (BUSPAR) 5 MG tablet, Take by mouth., Disp: , Rfl:  .  cetirizine (ZYRTEC) 10 MG tablet, Take 10 mg by mouth daily., Disp: , Rfl:  .  clopidogrel (PLAVIX) 75 MG tablet, Take 75 mg by mouth daily., Disp: , Rfl:  .  DULoxetine (CYMBALTA) 30 MG capsule, Take 30-60 mg by mouth 2 (two) times daily. 60 mg every morning and 30 mg every evening., Disp: , Rfl:  .  DULoxetine (CYMBALTA) 30 MG capsule, Take 90 mg by mouth daily., Disp: , Rfl:  .  fenofibrate 54 MG tablet, Take 54 mg by mouth every evening., Disp: , Rfl:  .  fexofenadine (ALLEGRA) 180 MG tablet, Take 180 mg by mouth daily., Disp: , Rfl:  .  fluticasone (FLONASE) 50 MCG/ACT nasal spray, Place 2 sprays into both nostrils daily., Disp: , Rfl:  .  glipiZIDE (GLUCOTROL) 5 MG tablet, , Disp: , Rfl:  .  glipiZIDE-metformin (METAGLIP) 5-500 MG per tablet, Take 2 tablets by mouth 2 (two) times daily., Disp: , Rfl:  .  hydrochlorothiazide (MICROZIDE) 12.5 MG capsule, Take 12.5 mg by mouth daily., Disp: , Rfl:  .  insulin aspart (NOVOLOG FLEXPEN) 100 UNIT/ML FlexPen, Inject 0-15 Units into the skin  3 (three) times daily with meals as needed for high blood sugar. , Disp: , Rfl:  .  Insulin Glargine (LANTUS SOLOSTAR) 100 UNIT/ML Solostar Pen, Inject 41 Units into the skin at bedtime., Disp: , Rfl:  .  levofloxacin (LEVAQUIN) 750 MG tablet, Take 1 tablet (750 mg total) by mouth daily. (Patient not taking: Reported on 01/20/2015), Disp: 3 tablet, Rfl: 0 .  Lido-Capsaicin-Men-Methyl Sal (MEDI-PATCH-LIDOCAINE EX), Apply topically., Disp: , Rfl:  .  Liraglutide (VICTOZA) 18 MG/3ML SOPN, Inject 0.6 mg into the skin every evening. , Disp: , Rfl:  .  LORazepam (ATIVAN) 0.5 MG tablet, , Disp: , Rfl:  .  metaxalone (SKELAXIN) 800 MG tablet, Take 800 mg by mouth 3 (three) times daily. Take half tab orally prn, Disp: , Rfl:  .  metFORMIN (GLUCOPHAGE) 500 MG tablet, , Disp: , Rfl:  .  metoprolol tartrate (LOPRESSOR) 25 MG tablet, Take 0.5 tablets (12.5 mg total) by mouth 2 (two) times daily. (Patient not taking: Reported on 03/24/2015), Disp: 30 tablet, Rfl: 1 .  mometasone (NASONEX) 50 MCG/ACT nasal spray, Place 2 sprays into the nose daily., Disp: , Rfl:  .  montelukast (SINGULAIR) 10 MG tablet, Take 10 mg by mouth daily., Disp: , Rfl:  .  NARCAN 4 MG/0.1ML LIQD, , Disp: , Rfl:  .  nitroGLYCERIN (NITROSTAT) 0.4 MG SL  tablet, Place 1 tablet (0.4 mg total) under the tongue every 5 (five) minutes x 3 doses as needed for chest pain., Disp: 25 tablet, Rfl: 1 .  Omega-3 Krill Oil 300 MG CAPS, Take 1 capsule by mouth daily., Disp: , Rfl:  .  Oxcarbazepine (TRILEPTAL) 300 MG tablet, Take 300 mg by mouth 2 (two) times daily. , Disp: , Rfl:  .  oxyCODONE (OXY IR/ROXICODONE) 5 MG immediate release tablet, Take 5 mg by mouth 3 (three) times daily as needed for moderate pain. , Disp: , Rfl:  .  OxyCODONE (OXYCONTIN) 20 mg T12A 12 hr tablet, Take 20 mg by mouth 3 (three) times daily., Disp: , Rfl:  .  pantoprazole (PROTONIX) 40 MG tablet, Take 40 mg by mouth daily., Disp: , Rfl:  .  pregabalin (LYRICA) 200 MG capsule,  Take 200 mg by mouth 3 (three) times daily., Disp: , Rfl:  .  tamsulosin (FLOMAX) 0.4 MG CAPS capsule, Take 0.4 mg by mouth daily., Disp: , Rfl:   Past Medical History: Past Medical History  Diagnosis Date  . Diabetes ([place])   . Hypertension   . BPH (benign prostatic hyperplasia)   . 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 Morbid obesity ([place])   . Obstructive sleep apnea   . Chronic back pain   . Depression   . UTI (lower urinary tract infection)   . Status post insertion of spinal cord stimulator   . Heart attack ([place])   . Stroke ([place])   . Basal cell carcinoma     forehead    Tobacco Use: History  Smoking status  . Former Smoker  . Types: Cigarettes  Smokeless tobacco  . Not on file    Comment: quit 45 years    Labs: Recent Review Flowsheet Data    Labs for ITP Cardiac and Pulmonary Rehab Latest Ref Rng 08/30/2013 12/19/2014 12/20/2014   Cholestrol 0 - 200 mg/dL 180 - 147   LDLCALC 0 - 99 mg/dL SEE COMMENT - UNABLE TO CALCULATE IF TRIGLYCERIDE OVER 400 mg/dL   HDL >40 mg/dL 27(L) - 14(L)   Trlycerides <150 mg/dL 465(H) - 512(H)   Hemoglobin A1c 4.8 - 5.6 % - 7.8(H) -       Exercise Target Goals:    Exercise Program Goal: Individual exercise prescription set with THRR, safety & activity barriers. Participant demonstrates ability to understand and report RPE using BORG scale, to self-measure pulse accurately, and to acknowledge the importance of the exercise prescription.  Exercise Prescription Goal: Starting with aerobic activity 30 plus minutes a day, 3 days per week for initial exercise prescription. Provide home exercise prescription and guidelines that participant acknowledges understanding prior to discharge.  Activity Barriers & Risk Stratification:     Activity Barriers & Risk Stratification - 01/20/15 1754    Activity Barriers & Risk Stratification   Activity Barriers Balance Concerns;History of Falls;Neck/Spine Problems;Back Problems;Deconditioning;Muscular  [facility] Device;Joint Problems      6 Minute Walk:     6 Minute Walk      01/20/15 1513 04/15/15 0920     6 Minute Walk   Phase Initial Discharge    Distance 470 feet 750 feet    Distance % Change  60 %    Walk Time 4.5 minutes 6 minutes    Resting HR 75 bpm 76 bpm    Resting BP 100/60 mmHg 134/82 mmHg    Max Ex. HR 113 bpm 87 bpm    Max Ex. BP 132/60 mmHg  132/74 mmHg    RPE 17 16    Symptoms No No       Initial Exercise Prescription:     Initial Exercise Prescription - 01/20/15 1500    Date of Initial Exercise Prescription   Date 01/20/15   Treadmill   MPH 0.8  Use intervals to complete time goals   Grade 0   Minutes 5   Bike   Level 0.2   Minutes 10   Recumbant Bike   Level 2   RPM 40   Watts 20   Minutes 15   NuStep   Level 2   Watts 30   Minutes 10   Arm Ergometer   Level 1   Watts 8   Minutes 10   Arm/Foot Ergometer   Level 4   Watts 12   Minutes 10   Cybex   Level 1   RPM 50   Minutes 10   Recumbant Elliptical   Level 1   RPM 40   Watts 10   Minutes 10   Elliptical   Level 1   Speed 3   Minutes 1   REL-XR   Level 1   Watts 25   Minutes 10   Prescription Details   Frequency (times per week) 3   Duration Progress to 30 minutes of continuous aerobic without signs/symptoms of physical distress   Intensity   THRR REST +  30   Ratings of Perceived Exertion 11-15   Progression Continue progressive overload as per policy without signs/symptoms or physical distress.   Resistance Training   Training Prescription Yes   Weight 2   Reps 10-15      Exercise Prescription Changes:     Exercise Prescription Changes      01/27/15 0600 02/04/15 0800 02/11/15 0900 02/17/15 0700 03/16/15 1000   Exercise Review   Progression Yes Yes Yes Yes No  Victor Wise has been absent since 03/02/15 due to kidney stones.    Response to Exercise   Blood Pressure (Admit) 148/82 mmHg   148/80 mmHg 162/84 mmHg   Blood Pressure (Exercise) 146/82 mmHg    144/80 mmHg 144/80 mmHg   Blood Pressure (Exit) 136/84 mmHg   140/88 mmHg 130/74 mmHg   Heart Rate (Admit) 67 bpm   77 bpm 65 bpm   Heart Rate (Exercise) 67 bpm   78 bpm 62 bpm   Heart Rate (Exit) 65 bpm   60 bpm 71 bpm   Rating of Perceived Exertion (Exercise) 11   10 6    Symptoms No   Some soreness from exercise. Pain due to kidney stones which eventually got so bad he could no longer come to class until resolved.   Comments    Victor Wise was having some soreness from the NuStep so his level was dropped back and his time was increased. He said this has helped.  Victor Wise plans on returning to class, but no exercise progression will be made until he returns. He may even need to lower workloads and progress back up to where he was before he had to take time off.    Duration Progress to 30 minutes of continuous aerobic without signs/symptoms of physical distress Progress to 30 minutes of continuous aerobic without signs/symptoms of physical distress Progress to 30 minutes of continuous aerobic without signs/symptoms of physical distress Progress to 30 minutes of continuous aerobic without signs/symptoms of physical distress Progress to 30 minutes of continuous aerobic without signs/symptoms of physical distress   Intensity Rest +  30 Rest + 30 Rest + 30 Rest + 30 Rest + 30   Progression Continue progressive overload as per policy without signs/symptoms or physical distress. Continue progressive overload as per policy without signs/symptoms or physical distress. Continue progressive overload as per policy without signs/symptoms or physical distress. Continue progressive overload as per policy without signs/symptoms or physical distress. Continue progressive overload as per policy without signs/symptoms or physical distress.   Resistance Training   Training Prescription Yes Yes Yes Yes Yes   Weight 2 2 3 3 3    Reps 10-15 10-15 10-15 10-15 10-15   Interval Training   Interval Training No No No No No   NuStep    Level 2 2 6 3 3    Watts 15 15 40 20 20   Minutes 20 20 20 20 20    Recumbant Elliptical   Level  1 4  Bio Step 4  Bio Step 4  Bio Step   RPM  40 40 40 40   Watts  10 25 25 25    Minutes  10 10 10 10      04/13/15 1500 04/15/15 0900         Exercise Review   Progression No  No progression due to health issues No  No progression due to health issues      Response to Exercise   Blood Pressure (Admit) 152/74 mmHg 152/74 mmHg      Blood Pressure (Exercise) 142/80 mmHg 142/80 mmHg      Blood Pressure (Exit) 140/78 mmHg 140/78 mmHg      Heart Rate (Admit) 73 bpm 73 bpm      Heart Rate (Exercise) 77 bpm 77 bpm      Heart Rate (Exit) 78 bpm 78 bpm      Rating of Perceived Exertion (Exercise) 11 11      Symptoms Chronic pain. Victor Wise is seeing multiple specialists to try and understand the pain and how to treat it. Until the pain can be managed exercise progression is impossible. Chronic pain. Victor Wise is seeing multiple specialists to try and understand the pain and how to treat it. Until the pain can be managed exercise progression is impossible.      Comments We are enccouraging Victor Wise to try and maintain his current workloads and to make an effort to just keep moving. We are enccouraging Victor Wise to try and maintain his current workloads and to make an effort to just keep moving.      Duration Progress to 30 minutes of continuous aerobic without signs/symptoms of physical distress Progress to 30 minutes of continuous aerobic without signs/symptoms of physical distress      Intensity Rest + 30 Rest + 30      Progression Continue progressive overload as per policy without signs/symptoms or physical distress. Continue progressive overload as per policy without signs/symptoms or physical distress.      Resistance Training   Training Prescription Yes Yes      Weight 3 3      Reps 10-15 10-15      Interval Training   Interval Training No No      NuStep   Level 3 3      Watts 20 20      Minutes 20 20       Recumbant Elliptical   Level 4  Bio Step 4  Bio Step      RPM 40 40      Watts 25 25      Minutes  10 10      Home Exercise Plan   Plans to continue exercise at  [facility]         Discharge Exercise Prescription (Final Exercise Prescription Changes):     Exercise Prescription Changes - 04/15/15 0900    Exercise Review   Progression No  No progression due to health issues   Response to Exercise   Blood Pressure (Admit) 152/74 mmHg   Blood Pressure (Exercise) 142/80 mmHg   Blood Pressure (Exit) 140/78 mmHg   Heart Rate (Admit) 73 bpm   Heart Rate (Exercise) 77 bpm   Heart Rate (Exit) 78 bpm   Rating of Perceived Exertion (Exercise) 11   Symptoms Chronic pain. Victor Wise is seeing multiple specialists to try and understand the pain and how to treat it. Until the pain can be managed exercise progression is impossible.   Comments We are enccouraging Victor Wise to try and maintain his current workloads and to make an effort to just keep moving.   Duration Progress to 30 minutes of continuous aerobic without signs/symptoms of physical distress   Intensity Rest + 30   Progression Continue progressive overload as per policy without signs/symptoms or physical distress.   Resistance Training   Training Prescription Yes   Weight 3   Reps 10-15   Interval Training   Interval Training No   NuStep   Level 3   Watts 20   Minutes 20   Recumbant Elliptical   Level 4  Bio Step   RPM 40   Watts 25   Minutes 10   Home Exercise Plan   Plans to continue exercise at [place]:  Target Goals: Understanding of nutrition guidelines, daily intake of sodium <1596m, cholesterol <2064m calories 30% from fat and 7% or less from saturated fats, daily to have 5 or more servings of fruits and vegetables.  Biometrics:     Pre Biometrics - 01/20/15 1451    Pre Biometrics   Height 5' 8.5" (1.74 m)   Weight (!) 307 lb 6.4 oz (139.436 kg)   Waist Circumference 58.25 inches    Hip Circumference 59 inches   Waist to Hip Ratio 0.99 %   BMI (Calculated) 46.2         Post Biometrics - 04/15/15 092542   Post  Biometrics   Height 5' 8.5" (1.74 m)   Weight (!) 318 lb 11.2 oz (144.561 kg)   Waist Circumference 57 inches   Hip Circumference 55 inches   Waist to Hip Ratio 1.04 %   BMI (Calculated) 47.9      Nutrition Therapy Plan and Nutrition Goals:     Nutrition Therapy & Goals - 01/30/15 1635    Nutrition Therapy   Diet Instructed on a meal plan based on 1800 calories including dietary guidelines for diabetes and heart health.   Fiber 30 grams   Whole Grain Foods 3 servings   Protein 8 ounces/day   Saturated Fats 12 max. grams   Fruits and Vegetables 5 servings/day   Personal Nutrition Goals   Personal Goal #1 Include at least 2 servings of carbohydrate per meal with range of 2-4 servings/meal.   Personal Goal #2 Balance meals with protein, 2-4 servings of carbohydrate and "free vegetables"   Personal Goal #3 Space 3 meals/day 4-5 hours apart   Comments At present, patient does not have a consistant meal plan and typically averages 2 meals per day + snacks.  Nutrition Discharge: Rate Your Plate Scores:     Rate Your Plate - 53/74/82 7078    Rate Your Plate Scores   Post Score 77   Post Score % 85.5 %      Nutrition Goals Re-Evaluation:     Nutrition Goals Re-Evaluation      02/25/15 1140 04/01/15 1116         Personal Goal #1 Re-Evaluation   Personal Goal #1  Victor Wise said today he lost weight in the past and ate better but all he can focus on now instead of good eating habits  is the pain in his side that urologist wants him to straight cath himself "I am not going to straight cath myself and I am thinking about getting a second opinion at [facility] but need prior approval.       Goal Progress Seen  Yes      Personal Goal #3 Re-Evaluation   Personal Goal #3 Victor Wise is trying to do this.        Goal Progress Seen Yes           Psychosocial: Target Goals: Acknowledge presence or absence of depression, maximize coping skills, provide positive support system. Participant is able to verbalize types and ability to use techniques and skills needed for reducing stress and depression.  Initial Review & Psychosocial Screening:     Initial Psych Review & Screening - 01/20/15 1746    Initial Review   Current issues with Current Depression;History of Depression;Current Psychotropic Meds;Current Sleep Concerns   Family Dynamics   Good Support System? Yes   Comments Patient states he lives with his sister Kerry Dory, "who is opinionated and has red hair."  His sister Jeani Hawking and his daughter [name] are his main supporters.  He would like to get stronger and try to move into senior housing.  Divorced, has one daughter and one grand-daughter.  See PHQ-9 score.     Barriers   Psychosocial barriers to participate in program The patient should benefit from training in stress management and relaxation.  Pt has a PHQ-9 score of 24.     Screening Interventions   Interventions Program counselor consult      Quality of Life Scores:     Quality of Life - 04/06/15 1820    Quality of Life Scores   Health/Function Pre 6.23 %   Health/Function Post 13.27 %   Health/Function % Change 113 %   Socioeconomic Pre 10.5 %   Socioeconomic Post 16.25 %   Socioeconomic % Change 55 %   Psych/Spiritual Pre 5.79 %   Psych/Spiritual Post 15.57 %   Psych/Spiritual % Change 169 %   Family Pre 5.9 %   Family Post 12.75 %   Family % Change 169 %   GLOBAL Pre 6.97 %   GLOBAL Post 14.27 %   GLOBAL % Change 105 %      PHQ-9:     Recent Review Flowsheet Data    Depression screen [facility] 2/9 04/03/2015 01/20/2015   Decreased Interest 3 3   Down, Depressed, Hopeless 3 3   PHQ - 2 Score 6 6   Altered sleeping 3 3   Tired, decreased energy 3 3   Change in appetite 3 3   Feeling bad or failure about yourself  0 3   Trouble concentrating 3 3    Moving slowly or fidgety/restless 1 3   Suicidal thoughts 0 0   PHQ-9 Score 19 24   Difficult doing  work/chores Extremely dIfficult Extremely dIfficult      Psychosocial Evaluation and Intervention:     Psychosocial Evaluation - 04/15/15 1019    Discharge Psychosocial Assessment & Intervention   Comments Counselor met with Victor Wise today for discharge evaluation.  He continues to report multiple health issues, but was feeling better since beginning this program and seeing his level of improvement.  Counselor commended Victor Wise on his progress and his commitment to consistent exercise.  He is planning to participate in [place] [facility] gym beginning next week.  Victor Wise continues to have depressive symptoms but appears to be coping better with some reported change in medication in October.  He continues to report chronic pain but works through it and is set to graduate from this program this Friday.  Counselor encouraged Victor Wise to consistently exercise to maintain the progress made in this program.  He is to see his primary care physician next week to discuss possibly increasing his meds for mood to help improve overall functioning.        Psychosocial Re-Evaluation:     Psychosocial Re-Evaluation      02/18/15 1033 02/25/15 1141 04/01/15 1118 04/03/15 1252 04/07/15 1626   Psychosocial Re-Evaluation   Interventions   Encouraged to attend Cardiac Rehabilitation for the exercise;Stress management education     Comments Counselor provided Victor Wise with information recently on psychiatrists and therapists locally who are affordable.  He continues to engage in negative self-talk, although today he was more positive, especially when talking about spending time with his granddaughters.  Victor Wise will benefit from med eval for efficacy and ongoing therapy for his mood.   This was recommended to him by this counselor.  Melchizedek has mulitple stressors incl his sister being in the hospital so he had to miss  a few sessions of Cardiac Rehab.  Rohn said today he lost weight in the past and ate better but all he can focus on now instead of good eating habits  is the pain in his side that urologist wants him to straight cath himself "I am not going to straight cath myself and I am thinking about getting a second opinion at [facility] but need prior approval.  Teja today stated he is done with it. Just wants to make it to Jan 8th. After sitting and listening to [place]. He has been dealing with a chronic illness over 20 years. He has had multiple physicians and care givers and multiple ilnnesses in this time. He has barriers in receiving his health care: insurance costs, prescriptionsand the  cost through his insurance, ability to  easily get referrals, ablity to access care he feels may be of assistance to him , barriers are there because of Limitations set by is [employer] or the clinic providing his care. Azim agreed he is totally frustrated and has reached the limit of trying to help himself. He did state he is not suicidal when asked. He has agreed to call his primary caregiver and to sit down and talk to her about his frustrations and concerns about the overall care he is receiving. He will ask this caregiver to help him advocate for the items he needs to decrease his frustration.  He wants his medical team to listen to him and  care for him now.  He wants to afford his medications and he wants to seek other sources of treatment to help with his chronic pain other than more pain medicine, because he is  not able to stay awake with the doses that are currently prescribed. Jaesean has grandchildren he cares for and he wants to be able to enjoy his time with his grandchildren. Called Fischer's primary care office to review Eldra's state of mind and concerns. The nurse I spoke with will inform his primary practioner of the concerns and will see if they can bring Victor Wise into the office sooner than his next appointment of  04/28/15.   Continued Psychosocial Services Needed  Yes Yes        Vocational Rehabilitation: Provide vocational rehab assistance to qualifying candidates.   Vocational Rehab Evaluation & Intervention:     Vocational Rehab - 01/20/15 1737    Initial Vocational Rehab Evaluation & Intervention   Assessment shows need for Vocational Rehabilitation No      Education: Education Goals: Education classes will be provided on a weekly basis, covering required topics. Participant will state understanding/return demonstration of topics presented.  Learning Barriers/Preferences:     Learning Barriers/Preferences - 01/20/15 1735    Learning Barriers/Preferences   Learning Barriers Hearing   Learning Preferences Written Material      Education Topics: General Nutrition Guidelines/Fats and Fiber: -Group instruction provided by verbal, written material, models and posters to present the general guidelines for heart healthy nutrition. Gives an explanation and review of dietary fats and fiber.          Cardiac Rehab from 04/15/2015 in [facility] Cardiac Rehab   Date  02/23/15   Educator  PI   Instruction Review Code  2- meets goals/outcomes      Controlling Sodium/Reading Food Labels: -Group verbal and written material supporting the discussion of sodium use in heart healthy nutrition. Review and explanation with models, verbal and written materials for utilization of the food label.   Exercise Physiology & Risk Factors: - Group verbal and written instruction with models to review the exercise physiology of the cardiovascular system and associated critical values. Details cardiovascular disease risk factors and the goals associated with each risk factor.   Aerobic Exercise & Resistance Training: - Gives group verbal and written discussion on the health impact of inactivity. On the components of aerobic and resistive training programs and the benefits of this training and how to safely  progress through these programs.   Flexibility, Balance, General Exercise Guidelines: - Provides group verbal and written instruction on the benefits of flexibility and balance training programs. Provides general exercise guidelines with specific guidelines to those with heart or lung disease. Demonstration and skill practice provided.      Cardiac Rehab from 04/15/2015 in [facility] Cardiac Rehab   Date  02/02/15   Educator  [facility]   Instruction Review Code  2- meets goals/outcomes      Stress Management: - Provides group verbal and written instruction about the health risks of elevated stress, cause of high stress, and healthy ways to reduce stress.      Cardiac Rehab from 04/15/2015 in [facility] Cardiac Rehab   Date  04/01/15   Educator  [facility]   Instruction Review Code  2- meets goals/outcomes      Depression: - Provides group verbal and written instruction on the correlation between heart/lung disease and depressed mood, treatment options, and the stigmas associated with seeking treatment.   Anatomy & Physiology of the Heart: - Group verbal and written instruction and models provide basic cardiac anatomy and physiology, with the coronary electrical and arterial systems. Review of: AMI, Angina, Valve disease, Heart Failure, Cardiac Arrhythmia, Pacemakers,  and the ICD.      Cardiac Rehab from 04/15/2015 in [facility] Cardiac Rehab   Date  04/06/15   Educator  SB   Instruction Review Code  2- meets goals/outcomes      Cardiac Procedures: - Group verbal and written instruction and models to describe the testing methods done to diagnose heart disease. Reviews the outcomes of the test results. Describes the treatment choices: Medical Management, Angioplasty, or Coronary Bypass Surgery.      Cardiac Rehab from 04/15/2015 in [facility] Cardiac Rehab   Date  03/02/15   Educator  SB   Instruction Review Code  2- meets goals/outcomes      Cardiac Medications: - Group verbal and written instruction to review  commonly prescribed medications for heart disease. Reviews the medication, class of the drug, and side effects. Includes the steps to properly store meds and maintain the prescription regimen.   Go Sex-Intimacy & Heart Disease, Get SMART - Goal Setting: - Group verbal and written instruction through game format to discuss heart disease and the return to sexual intimacy. Provides group verbal and written material to discuss and apply goal setting through the application of the S.M.A.R.T. Method.      Cardiac Rehab from 04/15/2015 in [facility] Cardiac Rehab   Date  03/02/15   Educator  SB   Instruction Review Code  2- meets goals/outcomes      Other Matters of the Heart: - Provides group verbal, written materials and models to describe Heart Failure, Angina, Valve Disease, and Diabetes in the realm of heart disease. Includes description of the disease process and treatment options available to the cardiac patient.      Cardiac Rehab from 04/15/2015 in [facility] Cardiac Rehab   Date  04/15/15   Educator  SB   Instruction Review Code  2- meets goals/outcomes      Exercise & Equipment Safety: - Individual verbal instruction and demonstration of equipment use and safety with use of the equipment.      Cardiac Rehab from 04/15/2015 in [facility] Cardiac Rehab   Date  01/20/15   Educator  D. Joya Gaskins, RN   Instruction Review Code  1- partially meets, needs review/practice      Infection Prevention: - Provides verbal and written material to individual with discussion of infection control including proper hand washing and proper equipment cleaning during exercise session.      Cardiac Rehab from 04/15/2015 in [facility] Cardiac Rehab   Date  01/20/15   Educator  D. Joya Gaskins, RN   Instruction Review Code  2- meets goals/outcomes      Falls Prevention: - Provides verbal and written material to individual with discussion of falls prevention and safety.      Cardiac Rehab from 04/15/2015 in [facility] Cardiac Rehab    Date  01/20/15   Educator  D. Joya Gaskins, RN   Instruction Review Code  1- partially meets, needs review/practice      Diabetes: - Individual verbal and written instruction to review signs/symptoms of diabetes, desired ranges of glucose level fasting, after meals and with exercise. Advice that pre and post exercise glucose checks will be done for 3 sessions at entry of program.      Cardiac Rehab from 04/15/2015 in [facility] Cardiac Rehab   Date  01/20/15   Educator  D. Joya Gaskins, RN   Instruction Review Code  1- partially meets, needs review/practice [Needs to meet wtih dietitian about meal planning]       Knowledge Questionnaire Score:  Knowledge Questionnaire Score - 04/03/15 1022    Knowledge Questionnaire Score   Post Score 26      Personal Goals and Risk Factors at Admission:     Personal Goals and Risk Factors at Admission - 01/20/15 1745    Personal Goals and Risk Factors on Admission    Weight Management Yes   Intervention Learn and follow the exercise and diet guidelines while in the program. Utilize the nutrition and education classes to help gain knowledge of the diet and exercise expectations in the program   Admit Weight 307 lb 6.4 oz (139.436 kg)   Increase Aerobic Exercise and Physical Activity Yes   Diabetes Yes   Goal Blood glucose control identified by blood glucose values, HgbA1C. Participant verbalizes understanding of the signs/symptoms of hyper/hypo glycemia, proper foot care and importance of medication and nutrition plan for blood glucose control.   Intervention Provide nutrition & aerobic exercise along with prescribed medications to achieve blood glucose in normal ranges: Fasting 65-99 mg/dL   Hypertension Yes   Goal Participant will see blood pressure controlled within the values of 140/63m/Hg or within value directed by their physician.   Intervention Provide nutrition & aerobic exercise along with prescribed medications to achieve BP 140/90 or less.    Lipids Yes   Goal Cholesterol controlled with medications as prescribed, with individualized exercise RX and with personalized nutrition plan. Value goals: LDL < 729m HDL > 4078mParticipant states understanding of desired cholesterol values and following prescriptions.   Intervention Provide nutrition & aerobic exercise along with prescribed medications to achieve LDL <32m4mDL >40mg13mStress Yes   Goal To meet with psychosocial counselor for stress and relaxation information and guidance. To state understanding of performing relaxation techniques and or identifying personal stressors.   Intervention Provide education on types of stress, identifiying stressors, and ways to cope with stress. Provide demonstration and active practice of relaxation techniques.      Personal Goals and Risk Factors Review:      Goals and Risk Factor Review      02/09/15 1020 02/11/15 0857 02/25/15 1141 02/27/15 0943 04/01/15 1118   Weight Management   Goals Progress/Improvement seen Yes Yes   No   Comments Patient has been aware of sodium intake and has been reading labels and is more aware of how much sodium he is consuming. Current weight 315.9. Patient has increased vegetable intake and making smarter choices when eating at restaurants even though his numbers have not improved much on the scale.   DavidRegina today he lost weight in the past and ate better but all he can focus on now instead of good eating habits  is the pain in his side that urologist wants him to straight cath himself "I am not going to straight cath myself and [facility]    Increase Aerobic Exercise and Physical Activity   Goals Progress/Improvement seen   Yes No;Yes Yes Yes   Comments  plans to walk outside and home and want to walk on the treadmill before he graduates.  Not alot of progress due to pain and soreness Some progress but is limited by his side pain.        Personal Goals Discharge (Final Personal Goals and Risk Factors Review):      Goals and Risk Factor Review - 04/01/15 1118    Weight Management   Goals Progress/Improvement seen No  Comments Andrae said today he lost weight in the past and ate better but all he can focus on now instead of good eating habits  is the pain in his side that urologist wants him to straight cath himself "I am not going to straight cath myself and I am thinking about getting a second opinion at [facility] but need prior approval.    Increase Aerobic Exercise and Physical Activity   Goals Progress/Improvement seen  Yes   Comments Some progress but is limited by his side pain.       ITP Comments:   Comments: discharge

## 2015-04-17 NOTE — Patient Instructions (Signed)
Discharge Instructions  Patient Details  Name: Victor Wise MRN: [id number] Date of Birth: 19-Jun-1950 Referring Provider:  Corey Skains, MD   Number of Visits: 2  Reason for Discharge:  Patient reached a stable level of exercise. Patient independent in their exercise.  Smoking History:  History  Smoking status  . Former Smoker  . Types: Cigarettes  Smokeless tobacco  . Not on file    Comment: quit 45 years    Diagnosis:  NSTEMI (non-ST elevated myocardial infarction) [facility])  Initial Exercise Prescription:     Initial Exercise Prescription - 01/20/15 1500    Date of Initial Exercise Prescription   Date 01/20/15   Treadmill   MPH 0.8  Use intervals to complete time goals   Grade 0   Minutes 5   Bike   Level 0.2   Minutes 10   Recumbant Bike   Level 2   RPM 40   Watts 20   Minutes 15   NuStep   Level 2   Watts 30   Minutes 10   Arm Ergometer   Level 1   Watts 8   Minutes 10   Arm/Foot Ergometer   Level 4   Watts 12   Minutes 10   Cybex   Level 1   RPM 50   Minutes 10   Recumbant Elliptical   Level 1   RPM 40   Watts 10   Minutes 10   Elliptical   Level 1   Speed 3   Minutes 1   REL-XR   Level 1   Watts 25   Minutes 10   Prescription Details   Frequency (times per week) 3   Duration Progress to 30 minutes of continuous aerobic without signs/symptoms of physical distress   Intensity   THRR REST +  30   Ratings of Perceived Exertion 11-15   Progression Continue progressive overload as per policy without signs/symptoms or physical distress.   Resistance Training   Training Prescription Yes   Weight 2   Reps 10-15      Discharge Exercise Prescription (Final Exercise Prescription Changes):     Exercise Prescription Changes - 04/15/15 0900    Exercise Review   Progression No  No progression due to health issues   Response to Exercise   Blood Pressure (Admit) 152/74 mmHg   Blood Pressure (Exercise) 142/80 mmHg   Blood  Pressure (Exit) 140/78 mmHg   Heart Rate (Admit) 73 bpm   Heart Rate (Exercise) 77 bpm   Heart Rate (Exit) 78 bpm   Rating of Perceived Exertion (Exercise) 11   Symptoms Chronic pain. Ryley is seeing multiple specialists to try and understand the pain and how to treat it. Until the pain can be managed exercise progression is impossible.   Comments We are enccouraging [facility] to try and maintain his current workloads and to make an effort to just keep moving.   Duration Progress to 30 minutes of continuous aerobic without signs/symptoms of physical distress   Intensity Rest + 30   Progression Continue progressive overload as per policy without signs/symptoms or physical distress.   Resistance Training   Training Prescription Yes   Weight 3   Reps 10-15   Interval Training   Interval Training No   NuStep   Level 3   Watts 20   Minutes 20   Recumbant Elliptical   Level 4  Bio Step   RPM 40   Watts 25   Minutes 10  Home Exercise Plan   Plans to continue exercise at [place]:     6 Minute Walk      01/20/15 1513 04/15/15 0920     6 Minute Walk   Phase Initial Discharge    Distance 470 feet 750 feet    Distance % Change  60 %    Walk Time 4.5 minutes 6 minutes    Resting HR 75 bpm 76 bpm    Resting BP 100/60 mmHg 134/82 mmHg    Max Ex. HR 113 bpm 87 bpm    Max Ex. BP 132/60 mmHg 132/74 mmHg    RPE 17 16    Symptoms No No       Quality of Life:     Quality of Life - 04/06/15 1820    Quality of Life Scores   Health/Function Pre 6.23 %   Health/Function Post 13.27 %   Health/Function % Change 113 %   Socioeconomic Pre 10.5 %   Socioeconomic Post 16.25 %   Socioeconomic % Change 55 %   Psych/Spiritual Pre 5.79 %   Psych/Spiritual Post 15.57 %   Psych/Spiritual % Change 169 %   Family Pre 5.9 %   Family Post 12.75 %   Family % Change 169 %   GLOBAL Pre 6.97 %   GLOBAL Post 14.27 %   GLOBAL % Change 105 %      Personal  Goals: Goals established at orientation with interventions provided to work toward goal.     Personal Goals and Risk Factors at Admission - 01/20/15 1745    Personal Goals and Risk Factors on Admission    Weight Management Yes   Intervention Learn and follow the exercise and diet guidelines while in the program. Utilize the nutrition and education classes to help gain knowledge of the diet and exercise expectations in the program   Admit Weight 307 lb 6.4 oz (139.436 kg)   Increase Aerobic Exercise and Physical Activity Yes   Diabetes Yes   Goal Blood glucose control identified by blood glucose values, HgbA1C. Participant verbalizes understanding of the signs/symptoms of hyper/hypo glycemia, proper foot care and importance of medication and nutrition plan for blood glucose control.   Intervention Provide nutrition & aerobic exercise along with prescribed medications to achieve blood glucose in normal ranges: Fasting 65-99 mg/dL   Hypertension Yes   Goal Participant will see blood pressure controlled within the values of 140/42mm/Hg or within value directed by their physician.   Intervention Provide nutrition & aerobic exercise along with prescribed medications to achieve BP 140/90 or less.   Lipids Yes   Goal Cholesterol controlled with medications as prescribed, with individualized exercise RX and with personalized nutrition plan. Value goals: LDL < 70mg , HDL > 40mg . Participant states understanding of desired cholesterol values and following prescriptions.   Intervention Provide nutrition & aerobic exercise along with prescribed medications to achieve LDL 70mg , HDL >40mg .   Stress Yes   Goal To meet with psychosocial counselor for stress and relaxation information and guidance. To state understanding of performing relaxation techniques and or identifying personal stressors.   Intervention Provide education on types of stress, identifiying stressors, and ways to cope with stress. Provide  demonstration and active practice of relaxation techniques.       Personal Goals Discharge:     Goals and Risk Factor Review - 04/01/15 1118    Weight Management   Goals Progress/Improvement seen No   Comments  Evo said today he lost weight in the past and ate better but all he can focus on now instead of good eating habits  is the pain in his side that urologist wants him to straight cath himself "I am not going to straight cath myself and I am thinking about getting a second opinion at [facility] but need prior approval.    Increase Aerobic Exercise and Physical Activity   Goals Progress/Improvement seen  Yes   Comments Some progress but is limited by his side pain.       Nutrition & Weight - Outcomes:     Pre Biometrics - 01/20/15 1451    Pre Biometrics   Height 5' 8.5" (1.74 m)   Weight (!) 307 lb 6.4 oz (139.436 kg)   Waist Circumference 58.25 inches   Hip Circumference 59 inches   Waist to Hip Ratio 0.99 %   BMI (Calculated) 46.2         Post Biometrics - 04/15/15 0927     Post  Biometrics   Height 5' 8.5" (1.74 m)   Weight (!) 318 lb 11.2 oz (144.561 kg)   Waist Circumference 57 inches   Hip Circumference 55 inches   Waist to Hip Ratio 1.04 %   BMI (Calculated) 47.9      Nutrition:     Nutrition Therapy & Goals - 01/30/15 1635    Nutrition Therapy   Diet Instructed on a meal plan based on 1800 calories including dietary guidelines for diabetes and heart health.   Fiber 30 grams   Whole Grain Foods 3 servings   Protein 8 ounces/day   Saturated Fats 12 max. grams   Fruits and Vegetables 5 servings/day   Personal Nutrition Goals   Personal Goal #1 Include at least 2 servings of carbohydrate per meal with range of 2-4 servings/meal.   Personal Goal #2 Balance meals with protein, 2-4 servings of carbohydrate and "free vegetables"   Personal Goal #3 Space 3 meals/day 4-5 hours apart   Comments At present, patient does not have a consistant meal plan and typically  averages 2 meals per day + snacks.      Nutrition Discharge:     Rate Your Plate - [id number]    Rate Your Plate Scores   Post Score 77   Post Score % 85.5 %      Education Questionnaire Score:     Knowledge Questionnaire Score - 04/03/15 1022    Knowledge Questionnaire Score   Post Score 26      Goals reviewed with patient; copy given to patient.

## 2015-04-17 NOTE — Progress Notes (Signed)
Daily Session Note  Patient Details  Name: KAWIKA BISCHOFF MRN: [id number] Date of Birth: [birth year] Referring Provider:  Corey Skains, MD  Encounter Date: 04/17/2015  Check In:     Session Check In - 04/17/15 0939    Check-In   Staff Present Gerlene Burdock, RN, BSN;Susanne Bice, RN, BSN, CCRP;Renee Dillard [place], MS, ACSM CEP, Exercise Physiologist   ER physicians immediately available to respond to emergencies See telemetry face sheet for immediately available ER MD   Medication changes reported     No   Fall or balance concerns reported    No   Warm-up and Cool-down Performed on first and last piece of equipment   VAD Patient? No   Pain Assessment   Currently in Pain? No/denies         Goals Met:  Proper associated with RPD/PD & O2 Sat Exercise tolerated well  Goals Unmet:  Not Applicable  Goals Comments:    Dr. Emily Filbert is Medical Director for [place] and [facility].

## 2015-04-21 DIAGNOSIS — Z79899 Other long term (current) drug therapy: Secondary | ICD-10-CM | POA: Diagnosis not present

## 2015-04-21 DIAGNOSIS — I25119 Atherosclerotic heart disease of native coronary artery with unspecified angina pectoris: Secondary | ICD-10-CM | POA: Diagnosis not present

## 2015-04-21 DIAGNOSIS — I679 Cerebrovascular disease, unspecified: Secondary | ICD-10-CM | POA: Diagnosis not present

## 2015-04-21 DIAGNOSIS — E1151 Type 2 diabetes mellitus with diabetic peripheral angiopathy without gangrene: Secondary | ICD-10-CM | POA: Diagnosis not present

## 2015-04-21 DIAGNOSIS — G2581 Restless legs syndrome: Secondary | ICD-10-CM | POA: Diagnosis not present

## 2015-04-21 DIAGNOSIS — F3341 Major depressive disorder, recurrent, in partial remission: Secondary | ICD-10-CM | POA: Diagnosis not present

## 2015-04-21 DIAGNOSIS — F411 Generalized anxiety disorder: Secondary | ICD-10-CM | POA: Diagnosis not present

## 2015-04-21 DIAGNOSIS — E782 Mixed hyperlipidemia: Secondary | ICD-10-CM | POA: Diagnosis not present

## 2015-04-21 DIAGNOSIS — E119 Type 2 diabetes mellitus without complications: Secondary | ICD-10-CM | POA: Diagnosis not present

## 2015-04-28 DIAGNOSIS — D519 Vitamin B12 deficiency anemia, unspecified: Secondary | ICD-10-CM | POA: Diagnosis not present

## 2015-04-28 DIAGNOSIS — Z79899 Other long term (current) drug therapy: Secondary | ICD-10-CM | POA: Diagnosis not present

## 2015-04-28 DIAGNOSIS — I10 Essential (primary) hypertension: Secondary | ICD-10-CM | POA: Diagnosis not present

## 2015-04-28 DIAGNOSIS — D649 Anemia, unspecified: Secondary | ICD-10-CM | POA: Diagnosis not present

## 2015-04-28 DIAGNOSIS — E782 Mixed hyperlipidemia: Secondary | ICD-10-CM | POA: Diagnosis not present

## 2015-04-28 DIAGNOSIS — I25119 Atherosclerotic heart disease of native coronary artery with unspecified angina pectoris: Secondary | ICD-10-CM | POA: Diagnosis not present

## 2015-04-28 DIAGNOSIS — E1151 Type 2 diabetes mellitus with diabetic peripheral angiopathy without gangrene: Secondary | ICD-10-CM | POA: Diagnosis not present

## 2015-05-04 DIAGNOSIS — G4733 Obstructive sleep apnea (adult) (pediatric): Secondary | ICD-10-CM | POA: Diagnosis not present

## 2015-05-14 DIAGNOSIS — Z6841 Body Mass Index (BMI) 40.0 and over, adult: Secondary | ICD-10-CM | POA: Diagnosis not present

## 2015-05-14 DIAGNOSIS — G4733 Obstructive sleep apnea (adult) (pediatric): Secondary | ICD-10-CM | POA: Diagnosis not present

## 2015-05-14 DIAGNOSIS — I5032 Chronic diastolic (congestive) heart failure: Secondary | ICD-10-CM | POA: Diagnosis not present

## 2015-05-14 DIAGNOSIS — Z8673 Personal history of transient ischemic attack (TIA), and cerebral infarction without residual deficits: Secondary | ICD-10-CM | POA: Diagnosis not present

## 2015-05-20 ENCOUNTER — Other Ambulatory Visit
Admission: RE | Admit: 2015-05-20 | Discharge: 2015-05-20 | Disposition: A | Discharge status: Home / Self Care | Payer: Commercial Managed Care - HMO | Source: Ambulatory Visit | Attending: *Deleted | Admitting: *Deleted

## 2015-05-20 DIAGNOSIS — L03032 Cellulitis of left toe: Secondary | ICD-10-CM | POA: Insufficient documentation

## 2015-05-20 DIAGNOSIS — L97521 Non-pressure chronic ulcer of other part of left foot limited to breakdown of skin: Secondary | ICD-10-CM | POA: Diagnosis not present

## 2015-05-20 DIAGNOSIS — B351 Tinea unguium: Secondary | ICD-10-CM | POA: Diagnosis not present

## 2015-05-20 DIAGNOSIS — E1142 Type 2 diabetes mellitus with diabetic polyneuropathy: Secondary | ICD-10-CM | POA: Diagnosis not present

## 2015-05-21 DIAGNOSIS — G4733 Obstructive sleep apnea (adult) (pediatric): Secondary | ICD-10-CM | POA: Diagnosis not present

## 2015-05-26 LAB — WOUND CULTURE

## 2015-05-27 DIAGNOSIS — M2032 Hallux varus (acquired), left foot: Secondary | ICD-10-CM | POA: Diagnosis not present

## 2015-06-01 DIAGNOSIS — A4902 Methicillin resistant Staphylococcus aureus infection, unspecified site: Secondary | ICD-10-CM | POA: Diagnosis not present

## 2015-06-01 DIAGNOSIS — D519 Vitamin B12 deficiency anemia, unspecified: Secondary | ICD-10-CM | POA: Diagnosis not present

## 2015-06-01 DIAGNOSIS — F3341 Major depressive disorder, recurrent, in partial remission: Secondary | ICD-10-CM | POA: Diagnosis not present

## 2015-06-01 DIAGNOSIS — E1151 Type 2 diabetes mellitus with diabetic peripheral angiopathy without gangrene: Secondary | ICD-10-CM | POA: Diagnosis not present

## 2015-06-01 DIAGNOSIS — I679 Cerebrovascular disease, unspecified: Secondary | ICD-10-CM | POA: Diagnosis not present

## 2015-06-01 DIAGNOSIS — I10 Essential (primary) hypertension: Secondary | ICD-10-CM | POA: Diagnosis not present

## 2015-06-03 DIAGNOSIS — L97521 Non-pressure chronic ulcer of other part of left foot limited to breakdown of skin: Secondary | ICD-10-CM | POA: Diagnosis not present

## 2015-06-06 DIAGNOSIS — G4733 Obstructive sleep apnea (adult) (pediatric): Secondary | ICD-10-CM | POA: Diagnosis not present

## 2015-06-15 DIAGNOSIS — Z79899 Other long term (current) drug therapy: Secondary | ICD-10-CM | POA: Diagnosis not present

## 2015-06-15 DIAGNOSIS — G2581 Restless legs syndrome: Secondary | ICD-10-CM | POA: Diagnosis not present

## 2015-06-15 DIAGNOSIS — Z6841 Body Mass Index (BMI) 40.0 and over, adult: Secondary | ICD-10-CM | POA: Diagnosis not present

## 2015-06-15 DIAGNOSIS — G4733 Obstructive sleep apnea (adult) (pediatric): Secondary | ICD-10-CM | POA: Diagnosis not present

## 2015-06-17 DIAGNOSIS — L97521 Non-pressure chronic ulcer of other part of left foot limited to breakdown of skin: Secondary | ICD-10-CM | POA: Diagnosis not present

## 2015-06-24 DIAGNOSIS — L97521 Non-pressure chronic ulcer of other part of left foot limited to breakdown of skin: Secondary | ICD-10-CM | POA: Diagnosis not present

## 2015-06-26 DIAGNOSIS — G4733 Obstructive sleep apnea (adult) (pediatric): Secondary | ICD-10-CM | POA: Diagnosis not present

## 2015-07-01 DIAGNOSIS — L97521 Non-pressure chronic ulcer of other part of left foot limited to breakdown of skin: Secondary | ICD-10-CM | POA: Diagnosis not present

## 2015-07-15 DIAGNOSIS — E1142 Type 2 diabetes mellitus with diabetic polyneuropathy: Secondary | ICD-10-CM | POA: Diagnosis not present

## 2015-07-15 DIAGNOSIS — L97521 Non-pressure chronic ulcer of other part of left foot limited to breakdown of skin: Secondary | ICD-10-CM | POA: Diagnosis not present

## 2015-07-22 ENCOUNTER — Other Ambulatory Visit: Payer: Self-pay | Admitting: *Deleted

## 2015-07-22 DIAGNOSIS — Z794 Long term (current) use of insulin: Principal | ICD-10-CM

## 2015-07-22 DIAGNOSIS — E1159 Type 2 diabetes mellitus with other circulatory complications: Secondary | ICD-10-CM

## 2015-07-22 NOTE — Patient Outreach (Addendum)
[name]) Care Management  07/22/2015  Victor Wise 1950-10-15 [id number]  Subjective: Telephone call to patient's home number, no answer, left HIPAA compliant voice mail message and requested call back. Telephone call from patient and HIPAA verified.   Patient gave verbal authorization for [facility] to speak with daughter [name] regarding his healthcare needs as needed.   States he currently lives with his sister [name] who is not well, having health issues and does want RNCM to speak regarding his healthcare because he want his sister to concentrate on herself for now.  Discussed [facility] Care Management services and patient in agreement to receive services.    Patient states he is kicking but not too high.  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   Patient states hypertension is under control and does not have blood pressure daily.  Patient states he is in need of assistance with diabetes management and education.  States his last A1C 3 months ago was 6.7 and blood sugar today was 223.   States his primary MD is working medication adjustments to bring the blood sugars to a lower range.   States he is aware of signs, symptoms of low and high blood sugar.   States he monitors and test his blood sugars several times a day as needed based on his symptoms.   Patient states he is on several medications for diabetes.   States is he having a hard time affording medications and having to put things on his credit card.  States he is having a hard time paying medical bills and filling medication due to financial situation.   Patient states is currently receiving Workers Compensation and this has disqualified him from receiving a lot of assistance in the past.  States he also receives care through the [facility] for his back injury.  States last appointment with [facility] was on 06/09/15 and next appointment is 08/07/15.   Patient in agreement to a referral to [facility] for disease management, education, monitoring, care coordination for medical follow up due financial restrictions, and community resources.    Patient also in agreement to a referral to [facility] for medication review, medication assistance (patient unable to afford medications), medication management, and education.  Patient states he has a history of depression, is currently on medications for depression, has had counseling in the past, and is currently unable to afford counseling.   Patient states he was pronounced dead in 08/18/2006,  knows he still has work to do here on earth and was spared for a reason.  Patient is in agreement to [place] referral for depression screening follow up, financial assistance review, and community resources.   Patient states he has had home health through [place] and participated in the [place] exercise program in the past.  Patient states he ambulates with cane or walker as needed.  Patient states is does not have any transportation issues.  Patient in agreement to continue to receive [place] Management services.  Patient advised of RNCM's contact number, [facility] Care Management main phone number, and 24 hour Nurse Advice line number.   Objective:  Per Epic case review: Patient has hospitalized  12/18/14 - 12/21/14 for NSTEMI   Assessment:  Received [facility] HMO Tier 4 list referral on  07/08/15.    Plan:  RNCM will refer patient to [facility] for disease management, education, monitoring, care coordination for medical follow up due financial restrictions, and community resources.    RNCM will refer patient to [facility] for medication review, medication assistance (patient unable to afford medications), medication management, and education.  RNCM will refer patient  to [facility] [facility] referral for depression screening follow up, financial assistance review, and community resources.   Nadyne Gariepy H. Annia Friendly, BSN, [place] Management [facility] Telephonic CM Phone: [phone] Fax: [phone]

## 2015-07-24 DIAGNOSIS — G4733 Obstructive sleep apnea (adult) (pediatric): Secondary | ICD-10-CM | POA: Diagnosis not present

## 2015-07-27 ENCOUNTER — Encounter: Payer: Self-pay | Admitting: *Deleted

## 2015-07-27 ENCOUNTER — Other Ambulatory Visit: Payer: Self-pay | Admitting: *Deleted

## 2015-07-28 ENCOUNTER — Encounter: Payer: Self-pay | Admitting: *Deleted

## 2015-07-28 NOTE — Patient Outreach (Addendum)
[place] [facility]) Care Management  07/28/2015  TARAY ALLEN April 08, 1951 [id number]   Correction:  This encounter should be a home visit not telephone call.  This social worker visited patient in his home.  Patient very friendly and engaging, discussed his medical history and history of depression openly.  Also dicussed concerns about the amount of co-pays that are involved in his care.  Patient denied thoughts of self harm, however dicussed a long history of depression and treatment in the past.  Per patient, he is not interested in mental health  therapy at this time.  Patient was more concerned with being able to afford his co-pays to specialist and medicines.  Patient declined referral to the [facility] program through [facility].   Outpatient Encounter Prescriptions as of 07/27/2015  Medication Sig Note  . amLODipine (NORVASC) 10 MG tablet Take 10 mg by mouth daily.   [name] aspirin EC 81 MG tablet Take 81 mg by mouth daily.   [name] aspirin-acetaminophen-caffeine (EXCEDRIN MIGRAINE) 250-250-65 MG per tablet Take 2 tablets by mouth every 6 (six) hours as needed for headache.   . benazepril (LOTENSIN) 40 MG tablet Take 40 mg by mouth 2 (two) times daily.    [name] buPROPion (WELLBUTRIN XL) 150 MG 24 hr tablet Take 150 mg by mouth 2 (two) times daily.   . busPIRone (BUSPAR) 5 MG tablet Take by mouth. 01/23/2015: Received from: [facility]  . cetirizine (ZYRTEC) 10 MG tablet Take 10 mg by mouth daily.   . clopidogrel (PLAVIX) 75 MG tablet Take 75 mg by mouth daily.   . DULoxetine (CYMBALTA) 30 MG capsule Take 30-60 mg by mouth 2 (two) times daily. 60 mg every morning and 30 mg every evening.   . DULoxetine (CYMBALTA) 30 MG capsule Take 90 mg by mouth daily.   . fenofibrate 54 MG tablet Take 54 mg by mouth every evening.   . fexofenadine (ALLEGRA) 180 MG tablet Take 180 mg by mouth daily.   . fluticasone (FLONASE) 50 MCG/ACT nasal spray Place 2 sprays into both  nostrils daily.   . hydrochlorothiazide (MICROZIDE) 12.5 MG capsule Take 12.5 mg by mouth daily.   . insulin aspart (NOVOLOG FLEXPEN) 100 UNIT/ML FlexPen Inject 0-15 Units into the skin 3 (three) times daily with meals as needed for high blood sugar.    . Insulin Glargine (LANTUS SOLOSTAR) 100 UNIT/ML Solostar Pen Inject 41 Units into the skin at bedtime.   . Lido-Capsaicin-Men-Methyl Sal (MEDI-PATCH-LIDOCAINE EX) Apply topically.   . Liraglutide (VICTOZA) 18 MG/3ML SOPN Inject 0.6 mg into the skin every evening.    . metaxalone (SKELAXIN) 800 MG tablet Take 800 mg by mouth 3 (three) times daily. Take half tab orally prn   . mometasone (NASONEX) 50 MCG/ACT nasal spray Place 2 sprays into the nose daily.   . nitroGLYCERIN (NITROSTAT) 0.4 MG SL tablet Place 1 tablet (0.4 mg total) under the tongue every 5 (five) minutes x 3 doses as needed for chest pain.   . Omega-3 Krill Oil 300 MG CAPS Take 1 capsule by mouth daily.   . Oxcarbazepine (TRILEPTAL) 300 MG tablet Take 300 mg by mouth 2 (two) times daily.  12/20/2014: Pt thinks these were 180 mg.  Pls verify with pharmacy.  [name] oxyCODONE (OXY IR/ROXICODONE) 5 MG immediate release tablet Take 5 mg by mouth 3 (three) times daily as needed for moderate pain.    . OxyCODONE (OXYCONTIN) 20 mg T12A 12 hr tablet Take 20 mg  by mouth 3 (three) times daily.   . pantoprazole (PROTONIX) 40 MG tablet Take 40 mg by mouth daily.   . pregabalin (LYRICA) 200 MG capsule Take 200 mg by mouth 3 (three) times daily.   . tamsulosin (FLOMAX) 0.4 MG CAPS capsule Take 0.4 mg by mouth daily.   [name] glipiZIDE (GLUCOTROL) 5 MG tablet Reported on 07/27/2015 03/24/2015: Received from: [facility]  . glipiZIDE-metformin (METAGLIP) 5-500 MG per tablet Take 2 tablets by mouth 2 (two) times daily. Reported on 07/27/2015   . levofloxacin (LEVAQUIN) 750 MG tablet Take 1 tablet (750 mg total) by mouth daily. (Patient not taking: Reported on 07/27/2015)   . LORazepam (ATIVAN) 0.5  MG tablet Reported on 07/27/2015 03/24/2015: Received from: External Pharmacy  . metFORMIN (GLUCOPHAGE) 500 MG tablet Reported on 07/27/2015 03/24/2015: Received from: [facility]  . metoprolol tartrate (LOPRESSOR) 25 MG tablet Take 0.5 tablets (12.5 mg total) by mouth 2 (two) times daily. (Patient not taking: Reported on 03/24/2015)   . montelukast (SINGULAIR) 10 MG tablet Take 10 mg by mouth daily. Reported on 07/27/2015   . NARCAN 4 MG/0.1ML LIQD Reported on 07/27/2015 03/24/2015: Received from: External Pharmacy   No facility-administered encounter medications on file as of 07/27/2015.   Depression screen [facility] 2/9 07/22/2015 04/03/2015 01/20/2015  Decreased Interest 1 3 3   Down, Depressed, Hopeless 3 3 3   PHQ - 2 Score 4 6 6   Altered sleeping 3 3 3   Tired, decreased energy 3 3 3   Change in appetite 3 3 3   Feeling bad or failure about yourself  0 0 3  Trouble concentrating 3 3 3   Moving slowly or fidgety/restless 3 1 3   Suicidal thoughts 0 0 0  PHQ-9 Score 19 19 24   Difficult doing work/chores Extremely dIfficult Extremely dIfficult Extremely dIfficult    Functional Status Survey: Is the patient deaf or have difficulty hearing?: Yes Does the patient have difficulty seeing, even when wearing glasses/contacts?: Yes Does the patient have difficulty concentrating, remembering, or making decisions?: Yes Does the patient have difficulty walking or climbing stairs?: Yes Does the patient have difficulty dressing or bathing?: No Does the patient have difficulty doing errands alone such as visiting a doctor's office or shopping?: Yes  Plan:  This Education officer, museum will follow up with patient in approximately 2 weeks tinue to assess for social work needs.  Patient has already been referred to Pharmacy and to a RN Care Manager. This Education officer, museum will collaborate with both disciplines.    [name] [facility] Care Management [phone]

## 2015-07-29 ENCOUNTER — Encounter: Payer: Self-pay | Admitting: *Deleted

## 2015-07-29 ENCOUNTER — Other Ambulatory Visit: Payer: Self-pay | Admitting: *Deleted

## 2015-07-29 NOTE — Patient Outreach (Signed)
[place] [facility]) Care Management  07/29/2015  RAEES ATALLAH August 07, 1950 [id number]   RNCM placed call to patient ,HIPAA verified, to discuss arranging initial home visit, patient agreeable. Patient states  he currently has all of his prescribed medications and taking them . Patient briefly discussed concerns related  cost of co pays to the specialist that he sees and co pays on insulin.  Patient denies urgent concerns. Patient verbalized that this is a busy day for him, as he is providing transportation  I provided my contact number for concerns prior to home visit.  Plan RNCM will visit patient in home on Wednesday, 3/22 at 1300 to further assess care management needs . I will discuss patient concerns regarding insulin cost with pharmacist team.  Joylene Draft, RN, [place] Management [phone]- Mobile [phone]- [place] complies with applicable Federal civil rights laws and does not discriminate on the basis of race, color, national origin, age, disability, or sex. Espaol (Spanish)  [place] cumple con las leyes federales de derechos civiles aplicables y no discrimina por motivos de raza, color, nacionalidad, edad, discapacidad o sexo.  Ti?ng Vi?t ([place])  [place] tun th? lu?t dn quy?n hi?n hnh c?a Lin bang v khng phn bi?t ??i x? d?a trn ch?ng t?c, mu da, ngu?n g?c qu?c gia, ?? tu?i, khuy?t t?t, ho?c gi?i tnh.   (Arabic)   [place]                      .

## 2015-08-05 ENCOUNTER — Ambulatory Visit: Payer: Self-pay | Admitting: *Deleted

## 2015-08-05 ENCOUNTER — Other Ambulatory Visit: Payer: Self-pay | Admitting: Pharmacist

## 2015-08-05 ENCOUNTER — Other Ambulatory Visit: Payer: Self-pay | Admitting: *Deleted

## 2015-08-05 NOTE — Patient Outreach (Signed)
[place] [facility]) Care Management  08/05/2015  Victor Wise 1951/01/07 [id number]   Telephone call prior to arriving for scheduled home visit, patient states today is not a good day I have gotten a "bug", sore throat, a little fever.Patient reports he has a MD appointment on Friday. Patient request reschedule visit.  Plan Home visit rescheduled for Wednesday, March 29 at 3 pm.

## 2015-08-05 NOTE — Patient Outreach (Signed)
Victor Wise) Care Management  08/05/2015  Victor Wise 1950/12/22 [id number]   Victor Wise is a 65yo who was referred to [place] for medication management and assistance.  I made initial outreach call to patient today.  I verified patient's DOB and address.  Discussed scheduling initial pharmacy visit for medication management/education and patient was agreeable.  Patient voices some concern regarding copay cost of insulin and Victoza.  Patient reports he currently has all of his medications.  Briefly discussed options for medication assistance on the phone such as Extra Help and patient assistance programs.  Patient reports he was previously told he does not qualify for Extra Help but that was several years ago.  Patient reports he does not have his financial information available at this time.  Patient reports he will have financial information available at home visit.    Plan:  Initial home visit scheduled for 08/12/15 at 3:00 PM.  Visit will be joint home visit with Victor Wise.  I provided my contact number and encouraged patient to call me if he has any concerns regarding his medications prior to visit.     Victor Wise, Pharm.D. Pharmacy Resident [place] [phone]

## 2015-08-07 DIAGNOSIS — E1151 Type 2 diabetes mellitus with diabetic peripheral angiopathy without gangrene: Secondary | ICD-10-CM | POA: Diagnosis not present

## 2015-08-07 DIAGNOSIS — I10 Essential (primary) hypertension: Secondary | ICD-10-CM | POA: Diagnosis not present

## 2015-08-07 DIAGNOSIS — J309 Allergic rhinitis, unspecified: Secondary | ICD-10-CM | POA: Diagnosis not present

## 2015-08-07 DIAGNOSIS — F3341 Major depressive disorder, recurrent, in partial remission: Secondary | ICD-10-CM | POA: Diagnosis not present

## 2015-08-07 DIAGNOSIS — J45909 Unspecified asthma, uncomplicated: Secondary | ICD-10-CM | POA: Diagnosis not present

## 2015-08-07 DIAGNOSIS — J0191 Acute recurrent sinusitis, unspecified: Secondary | ICD-10-CM | POA: Diagnosis not present

## 2015-08-12 ENCOUNTER — Other Ambulatory Visit: Payer: Self-pay | Admitting: Pharmacist

## 2015-08-12 ENCOUNTER — Encounter: Payer: Self-pay | Admitting: *Deleted

## 2015-08-12 ENCOUNTER — Other Ambulatory Visit: Payer: Self-pay | Admitting: *Deleted

## 2015-08-12 DIAGNOSIS — D519 Vitamin B12 deficiency anemia, unspecified: Secondary | ICD-10-CM | POA: Diagnosis not present

## 2015-08-12 NOTE — Patient Outreach (Signed)
[place] [facility]) Care Management  Victor Wise   08/12/2015  Victor Wise [birth year] [id number]  Subjective: Victor Wise is a 65yo who was referred to Victor Wise for medication management and assistance.  I made initial home visit today.  This was a joint visit with [facility] CMRN Landis Martins and PharmD candidate Annie Sable.    Patient currently fills his prescription medications at [facility] except for insulin products which he fills at [facility].    Patient has all medications organized in a tool box.  Patient does not use a pill box but reports he has a system of organizing which medications to take in the morning and which medications to take in the evening.  I offered patient a pill box; however, patient declined offer and wants to continue current system of managing his medications.  Patient reports adherence with all medications.    I updated patient's medication list on EPIC based on review of patient's medication bottles and his medication list from PCP appointment on 08/07/15.  Patient reports Victoza was changed to Bydureon at recent PCP visit.  Patient reports PCP gave him a sample pen and patient confirms he took first dose on Saturday (08/08/15).  Patient states he plans to pick up Bydureon prescription this week prior to next dose on 08/15/15.    Objective:   Current Medications: Current Outpatient Prescriptions  Medication Sig Dispense Refill  . amLODipine (NORVASC) 10 MG tablet Take 10 mg by mouth daily.    [name] aspirin EC 81 MG tablet Take 81 mg by mouth daily.    [name] aspirin-acetaminophen-caffeine (EXCEDRIN MIGRAINE) 250-250-65 MG per tablet Take 2 tablets by mouth every 6 (six) hours as needed for headache.    . benazepril (LOTENSIN) 40 MG tablet Take 40 mg by mouth 2 (two) times daily.     [name] buPROPion (WELLBUTRIN XL) 150 MG 24 hr tablet Take 150 mg by mouth 2 (two) times daily.    . busPIRone (BUSPAR) 5 MG tablet Take 10 mg by mouth 2 (two) times daily.      . cetirizine (ZYRTEC) 10 MG tablet Take 10 mg by mouth daily.    . clopidogrel (PLAVIX) 75 MG tablet Take 75 mg by mouth daily.    . DULoxetine (CYMBALTA) 30 MG capsule Take 90 mg by mouth daily.    . fenofibrate 54 MG tablet Take 54 mg by mouth every evening.    . fluticasone (FLONASE) 50 MCG/ACT nasal spray Place 2 sprays into both nostrils daily.    . hydrochlorothiazide (MICROZIDE) 12.5 MG capsule Take 12.5 mg by mouth daily.    . Insulin Glargine (LANTUS SOLOSTAR) 100 UNIT/ML Solostar Pen Inject 45 Units into the skin at bedtime.     . insulin regular (NOVOLIN R,HUMULIN R) 100 units/mL injection Inject 0-15 Units into the skin 3 (three) times daily before meals.    . Lido-Capsaicin-Men-Methyl Sal (MEDI-PATCH-LIDOCAINE EX) Apply topically.    [name] loratadine (CLARITIN) 10 MG tablet Take 10 mg by mouth daily.    . metaxalone (SKELAXIN) 800 MG tablet Take 800 mg by mouth 3 (three) times daily. Take half tab orally prn    . NARCAN 4 MG/0.1ML LIQD Reported on 07/27/2015    . nitroGLYCERIN (NITROSTAT) 0.4 MG SL tablet Place 1 tablet (0.4 mg total) under the tongue every 5 (five) minutes x 3 doses as needed for chest pain. 25 tablet 1  . Omega-3 Krill Oil 300 MG CAPS Take 1 capsule by mouth  daily.    . Oxcarbazepine (TRILEPTAL) 300 MG tablet Take 300 mg by mouth 3 (three) times daily.     [name] oxyCODONE (OXY IR/ROXICODONE) 5 MG immediate release tablet Take 5 mg by mouth 3 (three) times daily as needed for moderate pain.     . OxyCODONE (OXYCONTIN) 20 mg T12A 12 hr tablet Take 20 mg by mouth 2 (two) times daily.     . pantoprazole (PROTONIX) 40 MG tablet Take 40 mg by mouth daily.    . pregabalin (LYRICA) 200 MG capsule Take 200 mg by mouth 3 (three) times daily.    . tamsulosin (FLOMAX) 0.4 MG CAPS capsule Take 0.4 mg by mouth daily.     No current facility-administered medications for this visit.   Functional Status: In your present state of health, do you have any difficulty performing the  following activities: 08/12/2015 07/27/2015  Hearing? Tempie Donning  Vision? Y Y  Difficulty concentrating or making decisions? Tempie Donning  Walking or climbing stairs? Y Y  Dressing or bathing? N N  Doing errands, shopping? Tempie Donning  Preparing Food and eating ? N N  Using the Toilet? N N  In the past six months, have you accidently leaked urine? Y Y  Do you have problems with loss of bowel control? N N  Managing your Medications? N N  Managing your Finances? N N  Housekeeping or managing your Housekeeping? Tempie Donning   Fall/Depression Screening: PHQ 2/9 Scores 08/12/2015 07/27/2015 07/22/2015 04/03/2015 01/20/2015  PHQ - 2 Score 6 - 4 6 6   PHQ- 9 Score 24 - 19 19 24   Exception Documentation - Patient refusal - - -    Assessment: 1.  Medication review Drugs sorted by system:  Neurologic/Psychologic: bupropion, buspirone, duloxetine, oxcarbazepine  Cardiovascular: amlodipine, aspirin, benazepril, clopidogrel, fenofibrate, hydrochlorothiazide, nitroglycerin SL, omega-3 krill oil  Pulmonary/Allergy: cetirizine, fluticasone, loratadine  Gastrointestinal: pantoprazole  Endocrine: Lantus, Novolin R, Bydureon  Pain: Excedrin migraine, lidocaine patch, metaxalone, Narcan, oxycodone IR and ER, pregabalin  Miscellaneous: tamsulosin   Duplications in therapy: cetirizine and loratadine - patient reports he takes both when allergies are bad. Advised patient against use of duplicate antihistamine allergy medications.  Gaps in therapy:  - Statin therapy - High intensity statin therapy indicated due to history of clinical ASCVD.  Per allergy list, patient is unable to tolerate statins.   - Beta-blocker for CAD - patient was previously on metoprolol but reports he was unable to tolerate it.  Please consider trial of another beta-blocker such as carvedilol.   Medications to avoid in the elderly: N/A, patient <65yo Drug interactions: aspirin, clopidogrel, and Excedrin - increased risk of bleeding.  Dual antiplatelet therapy is  indicated due to history of multiple drug eluding stents placed in August 2016.  Advised patient to avoid use of other NSAIDs including Excedrin due to risk of bleeding.   Other issues noted: none  2.  Medication assistance:  Patient reports difficulty affording Lantus and Bydureon.  Discussed options for medication assistance such as Extra help and patient assistance programs.  Patient reports he previously applied for Extra Help 3 years ago and was denied.  I reviewed qualifications for Extra Help and based on income, patient does not qualify for Extra Help; however, Lantus patient assistance program requires Extra Help denial letter to qualify for patient assistance.  Helped patient complete Extra Help application.  I believe patient will qualify for Lantus and Bydureon patient assistance programs.  Patient completed patient portion of Lantus and Bydureon  patient assistance applications.  I will send applications to patient's PCP office for completion of provider portion.  I requested for patient to obtain out of pocket expenses from [facility] and [place] to submit with the application and to mail me a copy of his income information.  Also requested for patient to mail me a copy of the acceptance or denial letter from social security once Extra Help application has been processed.    Plan: 1.  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   2.  I will send patient assistance applications to patient's PCP office with request for provider to complete provider portion of the applications.  3.  Patient will pick up out of pocket expense report for the current calendar year from [facility] and [employer]  Patient will mail out of pocket expense report and copy of income information to me.  Also requested for patient to let me know once he receives  letter from extra help regarding the status of his application and to mail me a copy of the acceptance or denial letter once he receives it.   4.  I will submit patient assistance applications for Lantus and Bydureon once I receive completed application from patient's PCP and income/out of pocket expense information from patient.    5.  I will continue to update patient on status of patient assistance applications as updates are available.  Patient confirms he is able to afford his medications until applications are processed.     [employer] CM Care Plan Problem One        Most Recent Value   Care Plan Problem One  Medication assistance   Role Documenting the Problem One  Clinical Pharmacist   Care Plan for Problem One  Active   [facility] Long Term Goal (31-90 days)  Patient will complete patient assistance applications for Lantus and Byduren in the next 31 days.    [facility] Long Term Goal Start Date  08/12/15   Interventions for Problem One Long Term Goal  Assisted patient in filling out patient portion of patient assistance application.  Provided patient with a list of documents required to submit with application.  Patient will obtain out of pocket cost from [place] and [employer] and make a copy of financial income to submit with application.  I will send applications to patient's provider to have provider portion completed.      Elisabeth Most, Pharm.D. Pharmacy Resident [place] [phone]

## 2015-08-12 NOTE — Patient Outreach (Signed)
[facility]) Care Management   08/12/2015  Victor Wise 03-10-1951 [id number]  Victor Wise is an 65 y.o. male  Subjective: " I'm still not feeling the best, getting over this flu".  Objective:  BP 130/74 mmHg  Pulse 60  Resp 20  SpO2 94% Review of Systems  HENT: Negative.   Eyes: Negative.   Respiratory: Positive for cough.        Voice hoarse  Cardiovascular: Negative for chest pain and leg swelling.  Gastrointestinal: Negative for nausea.  Genitourinary: Negative.   Musculoskeletal: Positive for falls.  Neurological: Positive for dizziness and weakness.       Complaint of general weakness  Psychiatric/Behavioral: Positive for depression. Negative for suicidal ideas.    Physical Exam  Constitutional: He is oriented to person, place, and time. He appears well-developed and well-nourished.  Cardiovascular: Normal rate and normal heart sounds.   Respiratory: Effort normal. He has decreased breath sounds.  Decreased breath sounds,   GI: Soft.  Neurological: He is alert and oriented to person, place, and time.  Skin: Skin is warm and dry.  Declined foot exam, reports area on his Great toe that he applies cream and bandage to daily.  Psychiatric: He has a normal mood and affect. His speech is normal and behavior is normal. Judgment and thought content normal. Cognition and memory are normal.    Current Medications:   Current Outpatient Prescriptions  Medication Sig Dispense Refill  . amLODipine (NORVASC) 10 MG tablet Take 10 mg by mouth daily.    [name] aspirin EC 81 MG tablet Take 81 mg by mouth daily.    [name] aspirin-acetaminophen-caffeine (EXCEDRIN MIGRAINE) 250-250-65 MG per tablet Take 2 tablets by mouth every 6 (six) hours as needed for headache.    . benazepril (LOTENSIN) 40 MG tablet Take 40 mg by mouth 2 (two) times daily.     [name] buPROPion (WELLBUTRIN XL) 150 MG 24 hr tablet Take 150 mg by mouth 2 (two) times daily.    . busPIRone (BUSPAR) 5 MG tablet Take  by mouth.    . cetirizine (ZYRTEC) 10 MG tablet Take 10 mg by mouth daily.    . clopidogrel (PLAVIX) 75 MG tablet Take 75 mg by mouth daily.    . DULoxetine (CYMBALTA) 30 MG capsule Take 30-60 mg by mouth 2 (two) times daily. 60 mg every morning and 30 mg every evening.    . DULoxetine (CYMBALTA) 30 MG capsule Take 90 mg by mouth daily.    . fenofibrate 54 MG tablet Take 54 mg by mouth every evening.    . fexofenadine (ALLEGRA) 180 MG tablet Take 180 mg by mouth daily.    . fluticasone (FLONASE) 50 MCG/ACT nasal spray Place 2 sprays into both nostrils daily.    [name] glipiZIDE (GLUCOTROL) 5 MG tablet Reported on 07/27/2015    . glipiZIDE-metformin (METAGLIP) 5-500 MG per tablet Take 2 tablets by mouth 2 (two) times daily. Reported on 07/27/2015    . hydrochlorothiazide (MICROZIDE) 12.5 MG capsule Take 12.5 mg by mouth daily.    . insulin aspart (NOVOLOG FLEXPEN) 100 UNIT/ML FlexPen Inject 0-15 Units into the skin 3 (three) times daily with meals as needed for high blood sugar.     . Insulin Glargine (LANTUS SOLOSTAR) 100 UNIT/ML Solostar Pen Inject 41 Units into the skin at bedtime.    [name] levofloxacin (LEVAQUIN) 750 MG tablet Take 1 tablet (750 mg total) by mouth daily. (Patient not taking: Reported on 07/27/2015) 3 tablet  0  . Lido-Capsaicin-Men-Methyl Sal (MEDI-PATCH-LIDOCAINE EX) Apply topically.    . Liraglutide (VICTOZA) 18 MG/3ML SOPN Inject 0.6 mg into the skin every evening.     [name] LORazepam (ATIVAN) 0.5 MG tablet Reported on 07/27/2015    . metaxalone (SKELAXIN) 800 MG tablet Take 800 mg by mouth 3 (three) times daily. Take half tab orally prn    . metFORMIN (GLUCOPHAGE) 500 MG tablet Reported on 07/27/2015    . metoprolol tartrate (LOPRESSOR) 25 MG tablet Take 0.5 tablets (12.5 mg total) by mouth 2 (two) times daily. (Patient not taking: Reported on 03/24/2015) 30 tablet 1  . mometasone (NASONEX) 50 MCG/ACT nasal spray Place 2 sprays into the nose daily.    . montelukast (SINGULAIR) 10 MG tablet  Take 10 mg by mouth daily. Reported on 07/27/2015    . NARCAN 4 MG/0.1ML LIQD Reported on 07/27/2015    . nitroGLYCERIN (NITROSTAT) 0.4 MG SL tablet Place 1 tablet (0.4 mg total) under the tongue every 5 (five) minutes x 3 doses as needed for chest pain. 25 tablet 1  . Omega-3 Krill Oil 300 MG CAPS Take 1 capsule by mouth daily.    . Oxcarbazepine (TRILEPTAL) 300 MG tablet Take 300 mg by mouth 2 (two) times daily.     [name] oxyCODONE (OXY IR/ROXICODONE) 5 MG immediate release tablet Take 5 mg by mouth 3 (three) times daily as needed for moderate pain.     . OxyCODONE (OXYCONTIN) 20 mg T12A 12 hr tablet Take 20 mg by mouth 3 (three) times daily.    . pantoprazole (PROTONIX) 40 MG tablet Take 40 mg by mouth daily.    . pregabalin (LYRICA) 200 MG capsule Take 200 mg by mouth 3 (three) times daily.    . tamsulosin (FLOMAX) 0.4 MG CAPS capsule Take 0.4 mg by mouth daily.     No current facility-administered medications for this visit.    Functional Status:   In your present state of health, do you have any difficulty performing the following activities: 07/27/2015 12/19/2014  Hearing? Y N  Vision? Y N  Difficulty concentrating or making decisions? Y N  Walking or climbing stairs? Y Y  Dressing or bathing? N N  Doing errands, shopping? Victor Wise  Preparing Food and eating ? N -  Using the Toilet? N -  In the past six months, have you accidently leaked urine? Y -  Do you have problems with loss of bowel control? N -  Managing your Medications? N -  Managing your Finances? N -  Housekeeping or managing your Housekeeping? Y -   Fall Risk  08/12/2015 07/27/2015 01/20/2015  Falls in the past year? Yes Yes Yes  Number falls in past yr: 2 or more 2 or more 2 or more  Injury with Fall? No No No  Risk Factor Category  High Fall Risk High Fall Risk High Fall Risk  Risk for fall due to : History of fall(s);Impaired balance/gait;Impaired mobility History of fall(s);Impaired balance/gait History of fall(s);Impaired  balance/gait;Impaired mobility;Medication side effect;Other (Comment)  Risk for fall due to (comments): - - low back problems - has nerve stimulator for L4-L5.  S/P right great toe amputation.  Has had surgery on Right knee.  Has had bilateral rotator cuff surgery.  Has chronic pain of lower back and legs.    Follow up Falls evaluation completed;Falls prevention discussed Falls prevention discussed -   Fall/Depression Screening:    PHQ 2/9 Scores 07/27/2015 07/22/2015 04/03/2015 01/20/2015  PHQ - 2  Score - 4 6 6   PHQ- 9 Score - 19 19 24   Exception Documentation Patient refusal - - -    Assessment:  Initial home visit with Elisabeth Most , pharmacist. Home neat , 1 scatter room noted in living room.  Patient discussed his recent episode with "Flu", and reports he is just getting over it, voice is still a little hoarse.  Patient concern today is cost of medication, and co payments for specialist visit. Pharmacist working to assist with medication co- payment that will help with concern for amount that he pays out monthly. Patient's next concern is getting his diabetes back under control with reducing A1c.   Diabetes Patient reports checking his blood sugar at least 2 times a day but does not always keep a written record, and not sure he wants to work toward this goal. Patient states his blood sugar has been running higher lately, 30 day average 261. Patient states he is adhering to recent changes in Lantus insulin gradually increasing , and Bydureon started on previous Friday. Mr. Slayton reports his latest A1c is 9.5. Mr. Overgaard voiced concern regarding increased A1c, stated I had it under control, and trying to get it back down. Patient denies episodes of hypoglycemia, reviewed rule of 15 in treating low blood sugar less than 70, reinforced importance of checking blood sugar after treating. Educated on importance of monitoring food intake, and having a balanced diet monitoring  carbohydrate.    Congestive heart Failure Mr.Stegner has working scales,does not weight on a regular basis,educated patient on importance of weighing, to track small increases in weight gain sooner, patient states that he heard it best not to do that, currently patient does not want to work toward this goal.. Denies shortness of breath no swelling noted. Educated patient on importance of notifying MD of increased shortness of breath , swelling and greater than 5 pound weight gain in a week, patient verbalizes understanding this and follows up with his PCP with concerns.   Fall Risk Patient reports recent fall a few days ago, reports falling in garage lost his balance while reaching for something and being unable to get up, they had to call EMS to assist him from the floor, denies injury. Patient discussed his chronic back pain and problems with balance, has a cane for use. Educated on fall prevention measures.   Plan:  Patient will work toward making diet changes to help toward his goal of improving A1c Next home visit scheduled for April 25.   THN CM Care Plan Problem One        Most Recent Value   Care Plan Problem One  Patient concern related to increase A1c   Role Documenting the Problem One  Care Management [place] for Problem One  Active   THN Long Term Goal (31-90 days)  Patient will report decrease in A1c in the next 90 days   THN Long Term Goal Start Date  08/12/15   Interventions for Problem One Long Term Goal  Educated on importance of monitoring diet, taking medicaiton as prescribed, and activity participation to improve blood sugar control   THN CM Short Term Goal #1 (0-30 days)  Patient will report working toward eating a balanced diet, limiting portions of carbohdyrates in the next 31 days   THN CM Short Term Goal #1 Start Date  08/12/15   Interventions for Short Term Goal #1  Educated on the including protein, and limiting starches in meals to 3  to 4 servings  reviewed serving sizes, plate guide for meal planning   THN CM Short Term Goal #2 (0-30 days)  Patient will report how to treat hypoglycemic episodes in the next 30 days   THN CM Short Term Goal #2 Start Date  08/13/15   Interventions for Short Term Goal #2  Provided education on the rule of 15 in treating hypoglycemia, reinforcing rechecking blood sugar after treament, reviewed 15 gram snack options to have on hand.    THN CM Care Plan Problem Two        Most Recent Value   Care Plan Problem Two  Falls   Role Documenting the Problem Two  Care Management Coordinator   Care Plan for Problem Two  Active   Interventions for Problem Two Long Term Goal   Educated on fall safety prevention strategies, avoid scatter rugs, keeping lights on at night, keep his cell phone in pocket .   THN Long Term Goal (31-90) days  Patient will report decreased falls in the next 31 days   THN Long Term Goal Start Date  08/12/15   THN CM Short Term Goal #1 (0-30 days)  Patient will report using his cane or walker at all times in the next 30 days   THN CM Short Term Goal #1 Start Date  08/12/15   Interventions for Short Term Goal #2   Educated on importance of always using cane, straight or quad to help with balance , prevent fall.   THN CM Short Term Goal #2 (0-30 days)  Patient will report doing chair exercises at least 3 days week in the next 30 days   THN CM Short Term Goal #2 Start Date  08/12/15   Interventions for Short Term Goal #2  Educated patient how regular exercise helps to improve balance, increase strenght , demonstrated exercise that he can perform from his chair.     Joylene Draft, RN, [place] Management [phone]- Mobile [phone]- Toll Free Main Office

## 2015-08-12 NOTE — Patient Outreach (Addendum)
[facility]) Care Management  08/12/2015  Victor Wise March 22, 1951 [id number]  Follow up phone call to patient regarding his mental health needs.  HIPPA compliant voicemail left requesting a return call This social worker will await a return call.    [name] [facility] Care Management [phone]

## 2015-08-13 ENCOUNTER — Encounter: Payer: Self-pay | Admitting: Pharmacist

## 2015-08-13 ENCOUNTER — Encounter: Payer: Self-pay | Admitting: *Deleted

## 2015-08-24 DIAGNOSIS — G4733 Obstructive sleep apnea (adult) (pediatric): Secondary | ICD-10-CM | POA: Diagnosis not present

## 2015-08-26 ENCOUNTER — Other Ambulatory Visit: Payer: Self-pay | Admitting: *Deleted

## 2015-08-26 NOTE — Patient Outreach (Signed)
[facility]) Care Management  08/26/2015  FORSTER PAMER Sep 14, 1950 [id number]  Phone call to patient to schedule home visit to follow up on patient's mental health needs.  Home visit scheduled for 09/08/15 at 11:00am.    [name] [facility] Care Management [phone]

## 2015-08-26 NOTE — Patient Outreach (Signed)
[facility]) Care Management  08/26/2015  TRACEN DUNKERLEY April 28, 1951 [id number]  Phone call to patient to follow up on mental health social work needs.  HIPPA compliant voicemail message left for a return call.    [name] [facility] Care Management [phone]

## 2015-09-02 ENCOUNTER — Other Ambulatory Visit: Payer: Self-pay | Admitting: Pharmacist

## 2015-09-02 NOTE — Patient Outreach (Signed)
[facility]) Care Management  09/02/2015  Victor Wise 1950-11-17 [id number]   Victor Wise is a 65yo who was referred to [place] for medication assistance.  I made a follow up phone call to patient regarding patient assistance applications.  There was no answer.  I left a HIPAA compliant voicemail for patient to return my phone call.  I will make a second outreach attempt to patient within one week if he does not return my phone call.    Elisabeth Most, Pharm.D. Pharmacy Resident [place] [phone]

## 2015-09-02 NOTE — Patient Outreach (Signed)
[facility] [facility]) Care Management  [place]   09/02/2015  Victor Wise [birth year] [id number]  Subjective: Victor Wise is a 65yo who was referred to [place] for medication assistance.  Patient returned my phone call regarding patient assistance applications.  Patient confirms he picked up out of pocket expense reports from both [place] and [employer] and received denial letter from [employer] regarding Extra Help application.    Victoza was changed to Bydureon at recent PCP visit.  Patient initiated Bydureon with sample pen provided by PCP's office.  Patient reports he was unable to pick up Bydureon prescription due to cost of medication.  Patient reports he has restarted Victoza.  I called [place] to confirm medication price of Bydureon.  Pharmacy reports Bydureon copay is $497 for 4 pens indicating that patient is in the donut hole.     Assessment/Plan: 1.  Medication assistance:  Patient reports difficulty affording Lantus and Bydureon.  Received completed patient assistance applications from patient's PCP.  Patient reports he will mail out of pocket expense reports, Extra Help denial letter, and income statement to me this week to submit with applications.   2.  Advised patient to call his PCP office regarding GLP-1 agonist to determine if provider has any additional samples of Bydureon that patient can use while waiting for patient assistance application to be processed.   3.  Will call patient once I receive the information from him in the mail to inform him when applications are submitted.    Addendum:  Patient called me to report he went by his PCP office and they did not have any samples of Bydureon; however, they had a sample of Victoza and informed patient to continue Victoza at this time.    Elisabeth Most, Pharm.D. Pharmacy Resident [place] [phone]

## 2015-09-08 ENCOUNTER — Encounter: Payer: Self-pay | Admitting: *Deleted

## 2015-09-08 ENCOUNTER — Other Ambulatory Visit: Payer: Self-pay | Admitting: *Deleted

## 2015-09-08 NOTE — Patient Outreach (Signed)
[facility]) Care Management  [facility] Social Work  09/08/2015  Victor Wise 31-Jul-1950 [id number]  Subjective:  Patient is a 65 year old malepa  Objective:   Encounter Medications:  Outpatient Encounter Prescriptions as of 09/08/2015  Medication Sig Note  . amLODipine (NORVASC) 10 MG tablet Take 10 mg by mouth daily.   [name] aspirin EC 81 MG tablet Take 81 mg by mouth daily.   [name] aspirin-acetaminophen-caffeine (EXCEDRIN MIGRAINE) 250-250-65 MG per tablet Take 2 tablets by mouth every 6 (six) hours as needed for headache. 08/12/2015: Reports taking as needed  . benazepril (LOTENSIN) 40 MG tablet Take 40 mg by mouth 2 (two) times daily.    [name] buPROPion (WELLBUTRIN XL) 150 MG 24 hr tablet Take 300 mg by mouth daily.    . busPIRone (BUSPAR) 5 MG tablet Take 10 mg by mouth at bedtime.  01/23/2015: Received from: [place]  . cetirizine (ZYRTEC) 10 MG tablet Take 10 mg by mouth daily. Usually takes at night when very bad allergy   . clopidogrel (PLAVIX) 75 MG tablet Take 75 mg by mouth daily.   . DULoxetine (CYMBALTA) 30 MG capsule Take 90 mg by mouth daily.   . fenofibrate 54 MG tablet Take 54 mg by mouth every evening.   . fluticasone (FLONASE) 50 MCG/ACT nasal spray Place 2 sprays into both nostrils daily.   . hydrochlorothiazide (MICROZIDE) 12.5 MG capsule Take 12.5 mg by mouth daily.   . Insulin Glargine (LANTUS SOLOSTAR) 100 UNIT/ML Solostar Pen Inject 45 Units into the skin at bedtime.    . insulin regular (NOVOLIN R,HUMULIN R) 100 units/mL injection Inject 0-15 Units into the skin 3 (three) times daily before meals.   . Lido-Capsaicin-Men-Methyl Sal (MEDI-PATCH-LIDOCAINE EX) Apply topically.   . Liraglutide (VICTOZA) 18 MG/3ML SOPN Inject 1.8 mg into the skin daily.    [name] loratadine (CLARITIN) 10 MG tablet Take 10 mg by mouth daily.   . metaxalone (SKELAXIN) 800 MG tablet Take 800 mg by mouth 3 (three) times daily. Take half tab orally prn   . NARCAN 4  MG/0.1ML LIQD Reported on 07/27/2015 08/12/2015: Has on hand if needed  . nitroGLYCERIN (NITROSTAT) 0.4 MG SL tablet Place 1 tablet (0.4 mg total) under the tongue every 5 (five) minutes x 3 doses as needed for chest pain. 08/12/2015: Has on hand if needed  . Omega-3 Krill Oil 300 MG CAPS Take 1 capsule by mouth daily.   . Oxcarbazepine (TRILEPTAL) 300 MG tablet Take 300 mg by mouth 3 (three) times daily.    [name] oxyCODONE (OXY IR/ROXICODONE) 5 MG immediate release tablet Take 5 mg by mouth 3 (three) times daily as needed for moderate pain.  08/12/2015: Patient has on hand if needed for rescue pain  . OxyCODONE (OXYCONTIN) 20 mg T12A 12 hr tablet Take 20 mg by mouth 2 (two) times daily.    . pantoprazole (PROTONIX) 40 MG tablet Take 40 mg by mouth daily.   . pregabalin (LYRICA) 200 MG capsule Take 200 mg by mouth 3 (three) times daily.   . tamsulosin (FLOMAX) 0.4 MG CAPS capsule Take 0.4 mg by mouth daily.    No facility-administered encounter medications on file as of 09/08/2015.    Functional Status:  In your present state of health, do you have any difficulty performing the following activities: 08/12/2015 07/27/2015  Hearing? Tempie Donning  Vision? Y Y  Difficulty concentrating or making decisions? Tempie Donning  Walking or climbing stairs? Tempie Donning  Dressing or bathing? N N  Doing errands, shopping? Tempie Donning  Preparing Food and eating ? N N  Using the Toilet? N N  In the past six months, have you accidently leaked urine? Y Y  Do you have problems with loss of bowel control? N N  Managing your Medications? N N  Managing your Finances? N N  Housekeeping or managing your Housekeeping? Tempie Donning    Fall/Depression Screening:  PHQ 2/9 Scores 08/12/2015 07/27/2015 07/22/2015 04/03/2015 01/20/2015  PHQ - 2 Score 6 - 4 6 6   PHQ- 9 Score 24 - 19 19 24   Exception Documentation - Patient refusal - - -    Assessment:  Patient continues to reports depressed mood, however refuses to go to treatment (group and individual)  first stating  that he cannot afford the co-pays and then stating that therapy  does not work Per patient, he has tried group therapy and bio-feedback.  However what has  helped best is distraction.  Per patient he  uses comedy, watching races and car shows to help distract him from the pain. Patient denies alcohol use, last used 2 years ago when he had a beer. Per patient, his doctor changed his medications around, Buspar at night and wellbutrin in the morning last Tuesday, however no affect yet. Patient is  working with the [facility] pharmacist to assist with medication assistance programs.  He will also work with [facility]) for assistance as well.  Patient discussed having conflict with his sister and is thinking about moving. Per patient, he has the application for [employer] senior housing but has not completed the application yet. Plan: Next meeting to take place in a neutral location due to conflicts with sister.           This Education officer, museum will follow up with patient regarding completion of the housing application and contacting [employer].

## 2015-09-08 NOTE — Patient Outreach (Signed)
[place] [facility]) Care Management   09/08/2015  CARMELO REIDEL [birth year] [id number]  FLORENTINO LAABS is an 65 y.o. male  Subjective: " I am doing pretty good, except still bothered most by this back pain, I have an appointment at [facility] to check my stimulator on tomorrow.   Objective:  BP 140/78 mmHg  Pulse 60  Resp 20  SpO2 97%  Greeted at door by Mr.Orrico, noted using his cane.  Review of Systems  Constitutional: Negative.   HENT: Negative.   Eyes: Negative.   Respiratory: Negative.   Gastrointestinal: Negative.   Genitourinary: Negative.   Musculoskeletal: Positive for back pain. Negative for falls.  Skin: Negative.   Neurological: Negative.  Negative for seizures.  Endo/Heme/Allergies: Negative.   Psychiatric/Behavioral: Positive for depression. Negative for suicidal ideas.    Physical Exam  Constitutional: He is oriented to person, place, and time. He appears well-developed and well-nourished.  Cardiovascular: Normal rate and normal heart sounds.   Respiratory: Effort normal and breath sounds normal.  GI: Soft.  Large abdomen  Neurological: He is alert and oriented to person, place, and time.  Skin: Skin is warm and dry.  Psychiatric: His behavior is normal. Judgment and thought content normal. His mood appears anxious.    Encounter Medications:   Outpatient Encounter Prescriptions as of 09/08/2015  Medication Sig Note  . amLODipine (NORVASC) 10 MG tablet Take 10 mg by mouth daily.   [name] aspirin EC 81 MG tablet Take 81 mg by mouth daily.   [name] aspirin-acetaminophen-caffeine (EXCEDRIN MIGRAINE) 250-250-65 MG per tablet Take 2 tablets by mouth every 6 (six) hours as needed for headache. 08/12/2015: Reports taking as needed  . benazepril (LOTENSIN) 40 MG tablet Take 40 mg by mouth 2 (two) times daily.    [name] buPROPion (WELLBUTRIN XL) 150 MG 24 hr tablet Take 300 mg by mouth daily.    . busPIRone (BUSPAR) 5 MG tablet Take 10 mg by mouth at bedtime.  01/23/2015: Received  from: [place]  . cetirizine (ZYRTEC) 10 MG tablet Take 10 mg by mouth daily. Usually takes at night when very bad allergy   . clopidogrel (PLAVIX) 75 MG tablet Take 75 mg by mouth daily.   . DULoxetine (CYMBALTA) 30 MG capsule Take 90 mg by mouth daily.   . fenofibrate 54 MG tablet Take 54 mg by mouth every evening.   . fluticasone (FLONASE) 50 MCG/ACT nasal spray Place 2 sprays into both nostrils daily.   . hydrochlorothiazide (MICROZIDE) 12.5 MG capsule Take 12.5 mg by mouth daily.   . Insulin Glargine (LANTUS SOLOSTAR) 100 UNIT/ML Solostar Pen Inject 45 Units into the skin at bedtime.    . insulin regular (NOVOLIN R,HUMULIN R) 100 units/mL injection Inject 0-15 Units into the skin 3 (three) times daily before meals.   . Lido-Capsaicin-Men-Methyl Sal (MEDI-PATCH-LIDOCAINE EX) Apply topically.   . Liraglutide (VICTOZA) 18 MG/3ML SOPN Inject 1.8 mg into the skin daily.    [name] loratadine (CLARITIN) 10 MG tablet Take 10 mg by mouth daily.   . metaxalone (SKELAXIN) 800 MG tablet Take 800 mg by mouth 3 (three) times daily. Take half tab orally prn   . NARCAN 4 MG/0.1ML LIQD Reported on 07/27/2015 08/12/2015: Has on hand if needed  . nitroGLYCERIN (NITROSTAT) 0.4 MG SL tablet Place 1 tablet (0.4 mg total) under the tongue every 5 (five) minutes x 3 doses as needed for chest pain. 08/12/2015: Has on hand if needed  . Omega-3 Krill Oil  300 MG CAPS Take 1 capsule by mouth daily.   . Oxcarbazepine (TRILEPTAL) 300 MG tablet Take 300 mg by mouth 3 (three) times daily.    [name] oxyCODONE (OXY IR/ROXICODONE) 5 MG immediate release tablet Take 5 mg by mouth 3 (three) times daily as needed for moderate pain.  08/12/2015: Patient has on hand if needed for rescue pain  . OxyCODONE (OXYCONTIN) 20 mg T12A 12 hr tablet Take 20 mg by mouth 2 (two) times daily.    . pantoprazole (PROTONIX) 40 MG tablet Take 40 mg by mouth daily.   . pregabalin (LYRICA) 200 MG capsule Take 200 mg by mouth 3 (three) times  daily.   . tamsulosin (FLOMAX) 0.4 MG CAPS capsule Take 0.4 mg by mouth daily.    No facility-administered encounter medications on file as of 09/08/2015.    Functional Status:   In your present state of health, do you have any difficulty performing the following activities: 08/12/2015 07/27/2015  Hearing? Tempie Donning  Vision? Y Y  Difficulty concentrating or making decisions? Tempie Donning  Walking or climbing stairs? Y Y  Dressing or bathing? N N  Doing errands, shopping? Tempie Donning  Preparing Food and eating ? N N  Using the Toilet? N N  In the past six months, have you accidently leaked urine? Y Y  Do you have problems with loss of bowel control? N N  Managing your Medications? N N  Managing your Finances? N N  Housekeeping or managing your Housekeeping? Tempie Donning    Fall/Depression Screening:    PHQ 2/9 Scores 09/08/2015 08/12/2015 07/27/2015 07/22/2015 04/03/2015 01/20/2015  PHQ - 2 Score 6 6 - 4 6 6   PHQ- 9 Score 16 24 - 19 19 24   Exception Documentation - - Patient refusal - - -    Assessment:   Patient discussed that we need to find a new place to meet him, instead at the home of his sister, patient states [facility].  Back Pain  Continues to be  bothered by back pain, reports taking medication as prescribed, for follow up appointment at [facility] in am to check stimulator.  Diabetes: Patient reports that he has been working toward following diabetic food plan that he has been taught,  Patient checking blood sugars at least one to two times a day, he does not record readings in [facility] calendar, reports not enough space to keep book  near him. Patient denies any episodes of hypoglycemia, patient reports taking victrola instead of bydureon due to cost and states that he has completed all of the applications for patient assistance provided by pharmacist.  Congestive Heart Failure Patient does not weigh, he has scales at home but reports he is unable to see the numbers on the scale  due to his protruding abdomen and he states he has been told not to weigh daily, again reviewed importance of daily weights.  Patient denies increase in shortness of breath, no increase in swelling noted.  Patient is now going to the gym at [facility] to exercise at least 3 days a week, encouraged patient to weigh on scales at that location that he states he is able to see. Mr.Feagan reports that he continues to limit salt in diet.  Discussed with patient our health coach program for support, education for disease management for chronic disease such as his Diabetes, patient is interested, and we will work toward this plan as care management needs are met.  Plan:  Will follow up with patient within the next month to evaluate progress on goals and transition to health coach if no further care management needs addressed. Will collaborate visit with social worker and contact patient to select location.  [facility] CM Care Plan Problem One        Most Recent Value   Care Plan Problem One  Patient concern related to increase A1c   Role Documenting the Problem One  Care Management [place] for Problem One  Active   THN Long Term Goal (31-90 days)  Patient will report decrease in A1c in the next 90 days   THN Long Term Goal Start Date  08/12/15   Interventions for Problem One Long Term Goal  Reinforced  on importance of monitoring diet, taking medicaiton as prescribed, and activity participation to improve blood sugar control   THN CM Short Term Goal #1 (0-30 days)  Patient will report working toward eating a balanced diet, limiting portions of carbohdyrates in the next 31 days   THN CM Short Term Goal #1 Start Date  08/12/15   Interventions for Short Term Goal #1  Reviewed  on the including protein, and limiting starches in meals to 3 to 4 servings reviewed serving sizes, plate guide for meal planning and  provided and reviewed handout on carbs   THN CM Short Term Goal #2 (0-30 days)   Patient will report how to treat hypoglycemic episodes in the next 30 days   THN CM Short Term Goal #2 Start Date  08/13/15   [facility] CM Short Term Goal #2 Met Date  09/08/15   THN CM Short Term Goal #3 (0-30 days)  Patient will weigh at least weekly beginning in the next 30 days.   THN CM Short Term Goal #3 Start Date  09/08/15   Interventions for Short Tern Goal #3  Educated patient on importance of weighing to help with managing heart failure as well as helps with accountabillity with monitoring weigh as he monitoring his food intake to control diabetes,. Patient to weigh in gym where he can see scales.     THN CM Care Plan Problem Two        Most Recent Value   Care Plan Problem Two  Falls   Role Documenting the Problem Two  Care Management Coordinator   Care Plan for Problem Two  Active   THN Long Term Goal (31-90) days  Patient will report decreased falls in the next 31 days   THN Long Term Goal Start Date  08/12/15   THN Long Term Goal Met Date  09/08/15   THN CM Short Term Goal #1 (0-30 days)  Patient will report using his cane or walker at all times in the next 30 days   THN CM Short Term Goal #1 Start Date  08/12/15   [facility] CM Short Term Goal #1 Met Date   09/08/15   THN CM Short Term Goal #2 (0-30 days)  Patient will report doing chair exercises at least 3 days week in the next 30 days   THN CM Short Term Goal #2 Start Date  08/12/15   [facility] CM Short Term Goal #2 Met Date  09/08/15      Joylene Draft, RN, [place] Management [phone]- Mobile [phone]- [place]

## 2015-09-14 ENCOUNTER — Other Ambulatory Visit: Payer: Self-pay | Admitting: Pharmacist

## 2015-09-14 NOTE — Patient Outreach (Addendum)
Victor Wise) Care Management  09/14/2015  SAISH WEATHERWAX 11-Jul-1950 [id number]   Haydar Merrett is a 65yo who was referred to [place] for medication assistance.  I received patient's out of pocket expense report from [place] and [employer] in the mail.  The following information is still needed before patient assistance applications can be submitting:  A copy of patient's proof of monthly household income.  A copy of the Extra Help decision letter from social security.   Phone call to patient to request the information listed above.  There was no answer.  I left a HIPAA compliant voicemail for patient to return my phone call.  I will make another outreach attempt within one week if patient does not return my phone call.     Addendum:  I received a return phone call from patient.  I informed patient I received his out of pocket expense reports from [place] and [employer] in the mail.  I asked if patient mailed his income statement and letter from social security.  Patient reports he forgot to send in his income information and the Extra Help denial letter from social security.  Patient confirms he will make a copy of those documents today.  Patient also reports he plans to go pick up medication refills from the pharmacy today and will also pick up an updated out of pocket expense report.  Will make an appointment for a home visit on 09/15/15 at 4:00 PM to obtain the documents from patient to expedite the process of submitting the patient assistance applications.     Elisabeth Most, Pharm.D. Pharmacy Resident [place] [phone]

## 2015-09-15 ENCOUNTER — Other Ambulatory Visit: Payer: Self-pay | Admitting: Pharmacist

## 2015-09-15 NOTE — Patient Outreach (Signed)
[place] [facility]) Care Management  [place]   09/15/2015  MARKAVIOUS MICCO 09-04-1950 [id number]  Subjective: Victor Wise is a 65yo who was referred to [place] for medication management and assistance.  I made follow up home visit today.  Patient currently fills his prescription medications at [facility] except for insulin products which he fills at [facility].  Patient reports he recently picked up refills of all medications from [place] yesterday and patient confirms having all medications at this time and reports adherence with his medications.    Patient continues to use Victoza sample pen provided to him by his PCP office.  Patient has follow up PCP appointment on 09/22/15 and has adequate supply of Victoza until his visit.  Patient plans to ask for additional sample pen at that visit.  Patient also confirms he has both Lantus and regular insulin at this time.    Objective:   Encounter Medications: Outpatient Encounter Prescriptions as of 09/15/2015  Medication Sig Note  . amLODipine (NORVASC) 10 MG tablet Take 10 mg by mouth daily.   [name] aspirin EC 81 MG tablet Take 81 mg by mouth daily.   [name] aspirin-acetaminophen-caffeine (EXCEDRIN MIGRAINE) 250-250-65 MG per tablet Take 2 tablets by mouth every 6 (six) hours as needed for headache. 08/12/2015: Reports taking as needed  . benazepril (LOTENSIN) 40 MG tablet Take 40 mg by mouth 2 (two) times daily.    [name] buPROPion (WELLBUTRIN XL) 150 MG 24 hr tablet Take 300 mg by mouth daily.    . busPIRone (BUSPAR) 5 MG tablet Take 10 mg by mouth at bedtime.  01/23/2015: Received from: [place]  . cetirizine (ZYRTEC) 10 MG tablet Take 10 mg by mouth daily. Usually takes at night when very bad allergy   . clopidogrel (PLAVIX) 75 MG tablet Take 75 mg by mouth daily.   . DULoxetine (CYMBALTA) 30 MG capsule Take 90 mg by mouth daily.   . fenofibrate 54 MG tablet Take 54 mg by mouth every evening.   .  fluticasone (FLONASE) 50 MCG/ACT nasal spray Place 2 sprays into both nostrils daily.   . hydrochlorothiazide (MICROZIDE) 12.5 MG capsule Take 12.5 mg by mouth daily.   . Insulin Glargine (LANTUS SOLOSTAR) 100 UNIT/ML Solostar Pen Inject 54 Units into the skin at bedtime.    . insulin regular (NOVOLIN R,HUMULIN R) 100 units/mL injection Inject 0-15 Units into the skin 3 (three) times daily before meals. 09/15/2015: Sliding scale:  BG < 140 = no insulin BG 140-180 = 3 units BG 181-220 = 4 units BG 220-260 = 6 units BG 261-320 = 8 units BG 321-360 = 10 units BG 361-400 = 12 units  BG > 400 = 15 units, Call doctor on call   . Liraglutide (VICTOZA) 18 MG/3ML SOPN Inject 1.8 mg into the skin daily.    [name] loratadine (CLARITIN) 10 MG tablet Take 10 mg by mouth daily.   . metaxalone (SKELAXIN) 800 MG tablet Take 800 mg by mouth 3 (three) times daily. Take half tab orally prn   . NARCAN 4 MG/0.1ML LIQD Reported on 07/27/2015 08/12/2015: Has on hand if needed  . nitroGLYCERIN (NITROSTAT) 0.4 MG SL tablet Place 1 tablet (0.4 mg total) under the tongue every 5 (five) minutes x 3 doses as needed for chest pain. 08/12/2015: Has on hand if needed  . Omega-3 Krill Oil 300 MG CAPS Take 1 capsule by mouth daily.   . Oxcarbazepine (TRILEPTAL) 300 MG tablet  Take 300 mg by mouth 3 (three) times daily.    [name] oxyCODONE (OXY IR/ROXICODONE) 5 MG immediate release tablet Take 5 mg by mouth 3 (three) times daily as needed for moderate pain.  09/15/2015: Patient has on hand if needed for rescue pain  . OxyCODONE (OXYCONTIN) 20 mg T12A 12 hr tablet Take 20 mg by mouth 2 (two) times daily.    . pantoprazole (PROTONIX) 40 MG tablet Take 40 mg by mouth daily.   . pregabalin (LYRICA) 200 MG capsule Take 200 mg by mouth 3 (three) times daily.   . tamsulosin (FLOMAX) 0.4 MG CAPS capsule Take 0.4 mg by mouth daily.   . Lido-Capsaicin-Men-Methyl Sal (MEDI-PATCH-LIDOCAINE EX) Apply topically. Reported on 09/15/2015    No facility-administered  encounter medications on file as of 09/15/2015.   Functional Status: In your present state of health, do you have any difficulty performing the following activities: 08/12/2015 07/27/2015  Hearing? Tempie Donning  Vision? Y Y  Difficulty concentrating or making decisions? Tempie Donning  Walking or climbing stairs? Y Y  Dressing or bathing? N N  Doing errands, shopping? Tempie Donning  Preparing Food and eating ? N N  Using the Toilet? N N  In the past six months, have you accidently leaked urine? Y Y  Do you have problems with loss of bowel control? N N  Managing your Medications? N N  Managing your Finances? N N  Housekeeping or managing your Housekeeping? Tempie Donning    Fall/Depression Screening: PHQ 2/9 Scores 09/08/2015 08/12/2015 07/27/2015 07/22/2015 04/03/2015 01/20/2015  PHQ - 2 Score 6 6 - _0 PHQ- 9 Score 16 24 - _1 Exception Documentation - - Patient refusal - - -   Assessment: 1.  Medication assistance:  Patient reports difficulty affording Lantus and Bydureon.  Applications for Lantus and Bydureon patient assistance programs have been completed by patient and PCP.  Received copy of patient's income statement, Extra Help denial letter from social security and updated out-of-pocket expense report from [employer].  Patient's applications are now complete.    Plan: 1.  I will submit patient's applications for Lantus and Bydureon patient assistance program.  2.  I will call patient assistance programs in one week to check on the status of patient's applications and will update patient at that time.  Advised patient to contact me if he has any difficulty affording his medications prior to approval of patient assistance programs.    [facility] CM Care Plan Problem One        Most Recent Value   Care Plan Problem One  Medication assistance   Role Documenting the Problem One  Clinical Pharmacist   Care Plan for Problem One  Active   THN Long Term Goal (31-90 days)  Patient will complete patient assistance  applications for Lantus and Byduren in the next 31 days.    THN Long Term Goal Start Date  08/12/15   THN Long Term Goal Met Date  09/15/15   Interventions for Problem One Long Term Goal  Received patient's income information and Extra Help denial letter from patient today to complete his applications for Lantus and Bydureon patient assistance.  Will submit applications to patient assistance programs and will follow up on the status of the applications in one week.      THN CM Care Plan Problem Two        Most Recent Value   Care Plan Problem Two  housing  Role Documenting the Problem Two  Clinical Social Worker   Care Plan for Problem Two  Active   THN CM Short Term Goal #1 (0-30 days)  patient will complete senior housing application within the next 30 days   THN CM Short Term Goal #1 Start Date  09/08/15   Interventions for Short Term Goal #2   patient encouraged to complete housing application      Elisabeth Most, [place].D. Pharmacy Resident [place] [phone]

## 2015-09-22 ENCOUNTER — Other Ambulatory Visit: Payer: Self-pay | Admitting: Pharmacist

## 2015-09-22 DIAGNOSIS — I2584 Coronary atherosclerosis due to calcified coronary lesion: Secondary | ICD-10-CM | POA: Diagnosis not present

## 2015-09-22 DIAGNOSIS — F3341 Major depressive disorder, recurrent, in partial remission: Secondary | ICD-10-CM | POA: Diagnosis not present

## 2015-09-22 DIAGNOSIS — I679 Cerebrovascular disease, unspecified: Secondary | ICD-10-CM | POA: Diagnosis not present

## 2015-09-22 DIAGNOSIS — E1151 Type 2 diabetes mellitus with diabetic peripheral angiopathy without gangrene: Secondary | ICD-10-CM | POA: Diagnosis not present

## 2015-09-22 DIAGNOSIS — D519 Vitamin B12 deficiency anemia, unspecified: Secondary | ICD-10-CM | POA: Diagnosis not present

## 2015-09-22 DIAGNOSIS — I10 Essential (primary) hypertension: Secondary | ICD-10-CM | POA: Diagnosis not present

## 2015-09-22 NOTE — Patient Outreach (Addendum)
[place] [facility]) Care Management  [place]   09/22/2015  WEIR CROFF [birth year] [id number]   Victor Wise is a 65yo who was referred to [place] for medication assistance.  I am assisting patient in completing patient assistance applications for Lantus and Bydureon.  I made phone call to each patient assistance program to check on the status of patient's applications. The status of each application is listed below:  - [employer] application for Lantus patient assistance: Patient has been approved for patient assistance program for Lantus.  Representative informed me that Lantus should arrive at the patient's provider's office within 3 to 5 days.    - [place] & [place] application for Bydureon patient assistance: I spoke to [place] at [place] who reports that patient qualifies for Bydureon patient assistance, but the prescription on patient's application did not state whether patient needs Bydureon pens or vials.  Caren Griffins reports a new prescription will have to be submitted.  A prescription request was sent to patient's PCP office to the office contact, Titania.  Caren Griffins advised for prescription to be written for quantity of 12 (3 month supply) with 3 refills.  I called and spoke to [place] at patient's PCP office to update her on this information.  She reports she will submit new prescription as soon as she receives the request.    I called patient to update him on this information.  Informed him that Lantus has been mailed to provider's office.  I also informed him that Bydureon application is waiting on new prescription from the provider, but once that is submitted, patient will be approved.  Patient voices understanding.  Patient confirms he has all medications at this time.  Patient reports he continues to use Victoza sample pen provided to him by his PCP office and he has approximately two doses remaining.  Patient has follow up PCP visit today at 1:45 PM.  Advised patient to ask for  another sample pen since Bydureon application is still being processed.  Advised patient to call me if he is unable to obtain sample pen from his PCP office.  Patient voices understanding.   I will plan to call [place] & Me in one week to check on status of Bydureon application.     Update:  Patient called me back following his appointment with his PCP.  Patient confirms he was able to get two more sample Victoza pens from his PCP office (20 day supply).  Patient reports the office had not received the fax from [facility] yet but reports they planned to look through all of their faxes after 4 PM.  Patient reports his Lantus dose was increased (he is to titrate dose to 56 units this week, 58 units next week, and 60 units after that).  Patient confirms he has one and a half vials of Lantus remaining at home and will have enough Lantus until medication is delivered by [facility].  Informed patient I will plan to call [place] [employer] within the next week to check on the status of the Bydureon application and will update him at that time.     Elisabeth Most, Pharm.D. Pharmacy Resident [place] [phone]

## 2015-09-23 DIAGNOSIS — G4733 Obstructive sleep apnea (adult) (pediatric): Secondary | ICD-10-CM | POA: Diagnosis not present

## 2015-09-28 ENCOUNTER — Other Ambulatory Visit: Payer: Self-pay | Admitting: Pharmacist

## 2015-09-28 NOTE — Patient Outreach (Signed)
[place] [facility]) Care Management  09/28/2015  Victor Wise 03-Apr-1951 [id number]   Victor Wise is a 65yo who was referred to [place] for medication assistance.  I am assisting patient in completing patient assistance applications for Bydureon and Lantus.  Lantus application was approved by [facility] and product was to be shipped to patient's PCP office.  Last week, I was informed that patient qualifies for Bydureon patient assistance, but the prescription on patient's application did not state whether patient needs Bydureon pens or vials.  AZ&Me needed a new prescription in order to process application.  Prescription request was faxed to PCP office on 09/22/15.  I made phone call to [place] patient assistance program to check on the status of the application for Bydureon.  I was informed by [facility] representative that they are still waiting on a new prescription from the patient's PCP in order to complete patient's application.  I requested to have fax resent to patient's PCP office.  I called and spoke to [place] at patient's PCP office who reports they did not receive fax last week.  I informed Titania that fax has been resent.    I called patient to update him on this information.  Patient states he received letters from both AZ&Me and [employer]ofi.  He said AZ&Me letter states that they received request for medication but are unable to process it as no product has been selected.  I informed patient that his provider office is aware but they are waiting on a fax from [employer]Me in order to submit new prescription.  Patient plans to take letter by PCP office to see if that will help expedite the process.  Patient said [employer] letter states Lantus has been mailed, but patient has not heard from PCP office about delivery of medications.  Patient plans to follow up with PCP office to see if Lantus has been delivered.    I will follow up with patient's PCP office in 1 to 2 days to check on whether they  received prescription request for AZ&Me application, and will continue to update patient.     Victor Wise, Pharm.D. Pharmacy Resident [place] [phone]

## 2015-09-29 NOTE — Patient Outreach (Signed)
Care coordination:  I spoke to [place] at patient's PCP office who confirmed they received the prescription request form from [employer] via fax yesterday.  Titania reports she will get Leretha Pol to complete the paperwork and submit back to [place] patient assistance program.     I updated patient on this information.  Patient voices understanding and confirms he has all medications at this time.    I will call [place] [employer] [place] to check on the status of patient's application within one week and will continue to update patient.    Elisabeth Most, Pharm.D. Pharmacy Resident [place] [phone]

## 2015-10-05 ENCOUNTER — Other Ambulatory Visit: Payer: Self-pay | Admitting: Pharmacist

## 2015-10-05 NOTE — Patient Outreach (Signed)
[facility]) Care Management  [place]   10/05/2015  ELBER GALYEAN 1951/03/12 [id number]  Subjective: Victor Wise is a 65yo who was referred to [facility] for medication assistance.  I am assisting patient in completing patient assistance applications for Lantus and Bydureon.  Lantus application was approved by [employer] and product was to be shipped to patient's PCP office on 09/21/15.  Last week, I was informed that patient qualifies for Bydureon patient assistance, but the prescription on patient's application did not state whether patient needs Bydureon pens or vials.  [place] [employer] [place] needed a new prescription in order to process the application.  Prescription request was faxed to PCP office.  Titania at patient's PCP office confirmed they received the request on 09/29/15.    I made phone call to [facility] to check on the application for Bydureon patient assistance.  I spoke to [place] at [facility] who reports that patient qualifies for Bydureon patient assistance, and they received new prescription from provider's office.  Clifton James reports 90-day supply of Bydureon was mailed to patient on 09/30/15 and patient should received within 3 to 5 business days.     I called patient to update him on this information.  Patient confirms he received Bydureon on Friday, 10/02/15.  Patient confirms he stopped taking Victoza and took first dose of Bydureon on Sunday, 10/04/15.  Patient reports he has not received call from PCP office regarding Lantus shipment.  I called office contact, Titania, who was able to locate patient's Lantus.  I informed patient and patient confirms he will be able to go pick up Lantus from his PCP office today.    Objective:   Encounter Medications: Outpatient Encounter Prescriptions as of 10/05/2015  Medication Sig Note  . Exenatide ER (BYDUREON) 2 MG PEN Inject 2 mg into the skin once a week.   [name] amLODipine (NORVASC) 10 MG tablet Take 10 mg by mouth daily.   [name] aspirin EC  81 MG tablet Take 81 mg by mouth daily.   [name] aspirin-acetaminophen-caffeine (EXCEDRIN MIGRAINE) 250-250-65 MG per tablet Take 2 tablets by mouth every 6 (six) hours as needed for headache. 08/12/2015: Reports taking as needed  . benazepril (LOTENSIN) 40 MG tablet Take 40 mg by mouth 2 (two) times daily.    [name] buPROPion (WELLBUTRIN XL) 150 MG 24 hr tablet Take 300 mg by mouth daily.    . busPIRone (BUSPAR) 5 MG tablet Take 10 mg by mouth at bedtime.  01/23/2015: Received from: [place]  . cetirizine (ZYRTEC) 10 MG tablet Take 10 mg by mouth daily. Usually takes at night when very bad allergy   . clopidogrel (PLAVIX) 75 MG tablet Take 75 mg by mouth daily.   . DULoxetine (CYMBALTA) 30 MG capsule Take 90 mg by mouth daily.   . fenofibrate 54 MG tablet Take 54 mg by mouth every evening.   . fluticasone (FLONASE) 50 MCG/ACT nasal spray Place 2 sprays into both nostrils daily.   . hydrochlorothiazide (MICROZIDE) 12.5 MG capsule Take 12.5 mg by mouth daily.   . Insulin Glargine (LANTUS SOLOSTAR) 100 UNIT/ML Solostar Pen Inject 54 Units into the skin at bedtime.    . insulin regular (NOVOLIN R,HUMULIN R) 100 units/mL injection Inject 0-15 Units into the skin 3 (three) times daily before meals. 09/15/2015: Sliding scale:  BG < 140 = no insulin BG 140-180 = 3 units BG 181-220 = 4 units BG 220-260 = 6 units BG  261-320 = 8 units BG 321-360 = 10 units BG 361-400 = 12 units  BG > 400 = 15 units, Call doctor on call   . Lido-Capsaicin-Men-Methyl Sal (MEDI-PATCH-LIDOCAINE EX) Apply topically. Reported on 09/15/2015   . loratadine (CLARITIN) 10 MG tablet Take 10 mg by mouth daily.   . metaxalone (SKELAXIN) 800 MG tablet Take 800 mg by mouth 3 (three) times daily. Take half tab orally prn   . NARCAN 4 MG/0.1ML LIQD Reported on 07/27/2015 08/12/2015: Has on hand if needed  . nitroGLYCERIN (NITROSTAT) 0.4 MG SL tablet Place 1 tablet (0.4 mg total) under the tongue every 5 (five) minutes x 3 doses as needed  for chest pain. 08/12/2015: Has on hand if needed  . Omega-3 Krill Oil 300 MG CAPS Take 1 capsule by mouth daily.   . Oxcarbazepine (TRILEPTAL) 300 MG tablet Take 300 mg by mouth 3 (three) times daily.    [name] oxyCODONE (OXY IR/ROXICODONE) 5 MG immediate release tablet Take 5 mg by mouth 3 (three) times daily as needed for moderate pain.  09/15/2015: Patient has on hand if needed for rescue pain  . OxyCODONE (OXYCONTIN) 20 mg T12A 12 hr tablet Take 20 mg by mouth 2 (two) times daily.    . pantoprazole (PROTONIX) 40 MG tablet Take 40 mg by mouth daily.   . pregabalin (LYRICA) 200 MG capsule Take 200 mg by mouth 3 (three) times daily.   . tamsulosin (FLOMAX) 0.4 MG CAPS capsule Take 0.4 mg by mouth daily.   . [DISCONTINUED] Liraglutide (VICTOZA) 18 MG/3ML SOPN Inject 1.8 mg into the skin daily.     No facility-administered encounter medications on file as of 10/05/2015.    Functional Status: In your present state of health, do you have any difficulty performing the following activities: 08/12/2015 07/27/2015  Hearing? Tempie Donning  Vision? Y Y  Difficulty concentrating or making decisions? Tempie Donning  Walking or climbing stairs? Y Y  Dressing or bathing? N N  Doing errands, shopping? Tempie Donning  Preparing Food and eating ? N N  Using the Toilet? N N  In the past six months, have you accidently leaked urine? Y Y  Do you have problems with loss of bowel control? N N  Managing your Medications? N N  Managing your Finances? N N  Housekeeping or managing your Housekeeping? Tempie Donning    Fall/Depression Screening: PHQ 2/9 Scores 09/08/2015 08/12/2015 07/27/2015 07/22/2015 04/03/2015 01/20/2015  PHQ - 2 Score 6 6 - _0 PHQ- 9 Score 16 24 - _1 Exception Documentation - - Patient refusal - - -    Assessment: Medication assistance:  Patient with difficulty affording Lantus and Bydureon.  I assisted patient in completing applications for Lantus and Bydureon patient assistance programs, and patient has been approved for both  Lantus and Bydureon patient assistance.  Patient received first Bydureon shipment from patient assistance program on 10/02/15, and plans to pick up Lantus from his PCP office today.  Patient denies any further needs at this time.     Plan: I will close pharmacy program as goals have been met and no further intervention needed.  Advised patient to contact me in the future if he has any further questions or concerns regarding his medications and/or medication cost.  Patient confirms he has my contact information.  I will update [facility] CMRN Landis Martins and [facility] Licensed Clinical Social Worker [employer] regarding pharmacy program closure.     Elisabeth Most, Pharm.D.  Pharmacy Resident [place] [phone]

## 2015-10-08 ENCOUNTER — Encounter: Payer: Self-pay | Admitting: *Deleted

## 2015-10-08 ENCOUNTER — Other Ambulatory Visit: Payer: Self-pay | Admitting: *Deleted

## 2015-10-08 NOTE — Patient Outreach (Addendum)
[facility]) Care Management  [facility] Social Work  10/08/2015  KARDIN KRAVEC 01/31/1951 [id number]  Subjective:  Patient is a 65 year old male.  Objective:   Encounter Medications:  Outpatient Encounter Prescriptions as of 10/08/2015  Medication Sig Note  . amLODipine (NORVASC) 10 MG tablet Take 10 mg by mouth daily.   [name] aspirin EC 81 MG tablet Take 81 mg by mouth daily.   [name] aspirin-acetaminophen-caffeine (EXCEDRIN MIGRAINE) 250-250-65 MG per tablet Take 2 tablets by mouth every 6 (six) hours as needed for headache. 08/12/2015: Reports taking as needed  . benazepril (LOTENSIN) 40 MG tablet Take 40 mg by mouth 2 (two) times daily.    [name] buPROPion (WELLBUTRIN XL) 150 MG 24 hr tablet Take 300 mg by mouth daily.    . busPIRone (BUSPAR) 5 MG tablet Take 10 mg by mouth at bedtime.  01/23/2015: Received from: [place]  . cetirizine (ZYRTEC) 10 MG tablet Take 10 mg by mouth daily. Usually takes at night when very bad allergy   . clopidogrel (PLAVIX) 75 MG tablet Take 75 mg by mouth daily.   . DULoxetine (CYMBALTA) 30 MG capsule Take 90 mg by mouth daily.   . Exenatide ER (BYDUREON) 2 MG PEN Inject 2 mg into the skin once a week.   . fenofibrate 54 MG tablet Take 54 mg by mouth every evening.   . fluticasone (FLONASE) 50 MCG/ACT nasal spray Place 2 sprays into both nostrils daily.   . hydrochlorothiazide (MICROZIDE) 12.5 MG capsule Take 12.5 mg by mouth daily.   . Insulin Glargine (LANTUS SOLOSTAR) 100 UNIT/ML Solostar Pen Inject 54 Units into the skin at bedtime.    . insulin regular (NOVOLIN R,HUMULIN R) 100 units/mL injection Inject 0-15 Units into the skin 3 (three) times daily before meals. 09/15/2015: Sliding scale:  BG < 140 = no insulin BG 140-180 = 3 units BG 181-220 = 4 units BG 220-260 = 6 units BG 261-320 = 8 units BG 321-360 = 10 units BG 361-400 = 12 units  BG > 400 = 15 units, Call doctor on call   . Lido-Capsaicin-Men-Methyl Sal (MEDI-PATCH-LIDOCAINE  EX) Apply topically. Reported on 09/15/2015   . loratadine (CLARITIN) 10 MG tablet Take 10 mg by mouth daily.   . metaxalone (SKELAXIN) 800 MG tablet Take 800 mg by mouth 3 (three) times daily. Take half tab orally prn   . NARCAN 4 MG/0.1ML LIQD Reported on 07/27/2015 08/12/2015: Has on hand if needed  . nitroGLYCERIN (NITROSTAT) 0.4 MG SL tablet Place 1 tablet (0.4 mg total) under the tongue every 5 (five) minutes x 3 doses as needed for chest pain. 08/12/2015: Has on hand if needed  . Omega-3 Krill Oil 300 MG CAPS Take 1 capsule by mouth daily.   . Oxcarbazepine (TRILEPTAL) 300 MG tablet Take 300 mg by mouth 3 (three) times daily.    [name] oxyCODONE (OXY IR/ROXICODONE) 5 MG immediate release tablet Take 5 mg by mouth 3 (three) times daily as needed for moderate pain.  09/15/2015: Patient has on hand if needed for rescue pain  . OxyCODONE (OXYCONTIN) 20 mg T12A 12 hr tablet Take 20 mg by mouth 2 (two) times daily.    . pantoprazole (PROTONIX) 40 MG tablet Take 40 mg by mouth daily.   . pregabalin (LYRICA) 200 MG capsule Take 200 mg by mouth 3 (three) times daily.   . tamsulosin (FLOMAX) 0.4 MG CAPS capsule Take 0.4 mg by mouth daily.  No facility-administered encounter medications on file as of 10/08/2015.    Functional Status:  In your present state of health, do you have any difficulty performing the following activities: 08/12/2015 07/27/2015  Hearing? Tempie Donning  Vision? Y Y  Difficulty concentrating or making decisions? Tempie Donning  Walking or climbing stairs? Y Y  Dressing or bathing? N N  Doing errands, shopping? Tempie Donning  Preparing Food and eating ? N N  Using the Toilet? N N  In the past six months, have you accidently leaked urine? Y Y  Do you have problems with loss of bowel control? N N  Managing your Medications? N N  Managing your Finances? N N  Housekeeping or managing your Housekeeping? Tempie Donning    Fall/Depression Screening:  PHQ 2/9 Scores 09/08/2015 08/12/2015 07/27/2015 07/22/2015 04/03/2015 01/20/2015   PHQ - 2 Score 6 6 - 4 6 6   PHQ- 9 Score 16 24 - 19 19 24   Exception Documentation - - Patient refusal - - -    Assessment: Co-visit with RNCM Landis Martins.  Per patient , Medication received with the assistance of [facility] pharmacy, 3 months supply Lantus of and 4 months supply Bydureon at no charge. No need to contact SHIP(Senior Health Insurance Information Program) at this time.  Will use money to pay credit cards down.  Patient very appreciative. Had trouble getting out of the shower, has a shower chair but could not get left foot over the tub..  Per patient, he will use [employer] savings to get a smaller chair.  Patient continues to report depressed mood, denies suicide or homicide ideation,  however refuses referrals for mental health therapy or [place]. "I have tried all of that and it does not work"  "I would rather someone else benefit from it"  Patient states that he cannot afford the co-pay for mental health treatment however declines referrals for no cost mental health resources(groups, [facility]).  Per patient, he plans to remain living with his sister and brother in law. Per patient, for safety reasons does not feel that he should not live alone right now.   Plan:  This social worker will close patient's case to social work at this time.

## 2015-10-08 NOTE — Patient Outreach (Signed)
[facility]) Care Management   10/08/2015  Victor Wise [birth year] [id number]  Victor Wise is an 65 y.o. male  Subjective: " I'm kicking but not very high" Patient discussed receiving insulin through patient assistance'    Objective:  BP 140/70 mmHg  Pulse 64  Resp 18  SpO2 96%   Review of Systems  Constitutional: Negative.   HENT: Negative.   Respiratory: Negative.   Cardiovascular: Negative.  Negative for leg swelling.  Gastrointestinal: Negative.   Genitourinary: Negative.   Musculoskeletal: Positive for back pain and joint pain.  Skin: Negative.   Neurological: Negative.   Psychiatric/Behavioral: Positive for depression. Negative for suicidal ideas.    Physical Exam  Constitutional: He is oriented to person, place, and time. He appears well-developed and well-nourished.  Cardiovascular: Normal rate and normal heart sounds.   Respiratory: Effort normal.  GI: Soft.  Musculoskeletal:  Chronic back problems  Neurological: He is alert and oriented to person, place, and time.  Skin: Skin is warm and dry.    Encounter Medications:   Outpatient Encounter Prescriptions as of 10/08/2015  Medication Sig Note  . Exenatide ER (BYDUREON) 2 MG PEN Inject 2 mg into the skin once a week.   . Insulin Glargine (LANTUS SOLOSTAR) 100 UNIT/ML Solostar Pen Inject 60 Units into the skin at bedtime.    [name] amLODipine (NORVASC) 10 MG tablet Take 10 mg by mouth daily.   [name] aspirin EC 81 MG tablet Take 81 mg by mouth daily.   [name] aspirin-acetaminophen-caffeine (EXCEDRIN MIGRAINE) 250-250-65 MG per tablet Take 2 tablets by mouth every 6 (six) hours as needed for headache. 08/12/2015: Reports taking as needed  . benazepril (LOTENSIN) 40 MG tablet Take 40 mg by mouth 2 (two) times daily.    [name] buPROPion (WELLBUTRIN XL) 150 MG 24 hr tablet Take 300 mg by mouth daily.    . busPIRone (BUSPAR) 5 MG tablet Take 10 mg by mouth at bedtime.  01/23/2015: Received from: [place]  . cetirizine (ZYRTEC) 10 MG tablet Take 10 mg by mouth daily. Usually takes at night when very bad allergy   . clopidogrel (PLAVIX) 75 MG tablet Take 75 mg by mouth daily.   . DULoxetine (CYMBALTA) 30 MG capsule Take 90 mg by mouth daily.   . fenofibrate 54 MG tablet Take 54 mg by mouth every evening.   . fluticasone (FLONASE) 50 MCG/ACT nasal spray Place 2 sprays into both nostrils daily.   . hydrochlorothiazide (MICROZIDE) 12.5 MG capsule Take 12.5 mg by mouth daily.   . insulin regular (NOVOLIN R,HUMULIN R) 100 units/mL injection Inject 0-15 Units into the skin 3 (three) times daily before meals. 09/15/2015: Sliding scale:  BG < 140 = no insulin BG 140-180 = 3 units BG 181-220 = 4 units BG 220-260 = 6 units BG 261-320 = 8 units BG 321-360 = 10 units BG 361-400 = 12 units  BG > 400 = 15 units, Call doctor on call   . Lido-Capsaicin-Men-Methyl Sal (MEDI-PATCH-LIDOCAINE EX) Apply topically. Reported on 09/15/2015   . loratadine (CLARITIN) 10 MG tablet Take 10 mg by mouth daily.   . metaxalone (SKELAXIN) 800 MG tablet Take 800 mg by mouth 3 (three) times daily. Take half tab orally prn   . NARCAN 4 MG/0.1ML LIQD Reported on 07/27/2015 08/12/2015: Has on hand if needed  . nitroGLYCERIN (NITROSTAT) 0.4 MG SL tablet Place 1 tablet (0.4 mg total) under the tongue every 5 (five) minutes x 3  doses as needed for chest pain. 08/12/2015: Has on hand if needed  . Omega-3 Krill Oil 300 MG CAPS Take 1 capsule by mouth daily.   . Oxcarbazepine (TRILEPTAL) 300 MG tablet Take 300 mg by mouth 3 (three) times daily.    [name] oxyCODONE (OXY IR/ROXICODONE) 5 MG immediate release tablet Take 5 mg by mouth 3 (three) times daily as needed for moderate pain.  09/15/2015: Patient has on hand if needed for rescue pain  . OxyCODONE (OXYCONTIN) 20 mg T12A 12 hr tablet Take 20 mg by mouth 2 (two) times daily.    . pantoprazole (PROTONIX) 40 MG tablet Take 40 mg by mouth daily.   . pregabalin (LYRICA) 200 MG capsule Take 200 mg by  mouth 3 (three) times daily.   . tamsulosin (FLOMAX) 0.4 MG CAPS capsule Take 0.4 mg by mouth daily.    No facility-administered encounter medications on file as of 10/08/2015.    Functional Status:   In your present state of health, do you have any difficulty performing the following activities: 08/12/2015 07/27/2015  Hearing? Tempie Donning  Vision? Y Y  Difficulty concentrating or making decisions? Tempie Donning  Walking or climbing stairs? Y Y  Dressing or bathing? N N  Doing errands, shopping? Tempie Donning  Preparing Food and eating ? N N  Using the Toilet? N N  In the past six months, have you accidently leaked urine? Y Y  Do you have problems with loss of bowel control? N N  Managing your Medications? N N  Managing your Finances? N N  Housekeeping or managing your Housekeeping? Tempie Donning    Fall/Depression Screening:    PHQ 2/9 Scores 09/08/2015 08/12/2015 07/27/2015 07/22/2015 04/03/2015 01/20/2015  PHQ - 2 Score 6 6 - _0 PHQ- 9 Score 16 24 - _1 Exception Documentation - - Patient refusal - - -    Assessment:  Routine home visit  Diabetes- Patient continues to monitor blood sugar at least 3 times a day, 30 day average 222. Reports he will get A1c check next month.  Patient admits to eating foods that he is aware is not appropriate for his diet out of convenience . Reinforced importance of adhering to carbohydrate modified diet.   Fall Risk- Using cane, and reports going to the gym to exercise at least once a week., no recent report of falls.  Congestive heart failure-  No increased swelling , occasional shortness of breath, reports at that he is able to perform usual activities.   Medications- Taking medication as prescribed, patient has received assistance with insulin Bydureon , and lantus and has at least 3 months at present.  Patient will benefit from continued education and management of diabetes.   Plan:  Will plan follow up by telephone in 1 month with plan to transition to health coach  for continued education and management of Diabetes.   THN CM Care Plan Problem One        Most Recent Value   Care Plan Problem One  Patient concern related to increase A1c   Role Documenting the Problem One  Care Management [place] for Problem One  Active   THN Long Term Goal (31-90 days)  Patient will report decrease in A1c in the next 90 days   THN Long Term Goal Start Date  08/12/15   Interventions for Problem One Long Term Goal  Encouraged regarding importance of adhering to diet, exercise and medication plan  to control diabetes   THN CM Short Term Goal #1 (0-30 days)  Patient will report working toward eating a balanced diet, limiting portions of carbohdyrates in the next 31 days   THN CM Short Term Goal #1 Start Date  09/08/15   Interventions for Short Term Goal #1  Reinforced carbohydrate food to monitor in his diet, reviewed usual foods in his diet , discussed changes to make regarding baked meat,limited can processed foods, and healtier fast food options    THN CM Short Term Goal #2 (0-30 days)  Patient will report how to treat hypoglycemic episodes in the next 30 days   THN CM Short Term Goal #2 Start Date  08/13/15   [facility] CM Short Term Goal #2 Met Date  09/08/15   Interventions for Short Term Goal #2  Provided education on the rule of 15 in treating hypoglycemia, reinforcing rechecking blood sugar after treament, reviewed 15 gram snack options to have on hand.   THN CM Short Term Goal #3 (0-30 days)  Patient will weigh at least weekly beginning in the next 30 days.   THN CM Short Term Goal #3 Start Date  09/08/15   Interventions for Short [place]ern Goal #3  encouraged patient to weigh when he goes to gym, to increase accountability  towards sticking to his goals    THN CM Short Term Goal #4 (0-30 days)  Patient will report going to gym to exercise at least one day a week.in the next 30 days   THN CM Short Term Goal #4 Start Date  10/08/15   Interventions for Short Term  Goal #4  Educated on how exercise helps in controlling diabetes, and reducing A1c    THN CM Care Plan Problem Two        Most Recent Value   Care Plan Problem Two  Falls   Role Documenting the Problem Two  Care Management Coordinator   Care Plan for Problem Two  Not Active   Interventions for Problem Two Long Term Goal   Educated on fall safety prevention strategies, avoid scatter rugs, keeping lights on at night, keep his cell phone in pocket .   THN Long Term Goal (31-90) days  Patient will report decreased falls in the next 31 days   THN Long Term Goal Start Date  08/12/15   THN Long Term Goal Met Date  09/08/15   THN CM Short Term Goal #1 (0-30 days)  Patient will report using his cane or walker at all times in the next 30 days   THN CM Short Term Goal #1 Start Date  08/12/15   [facility] CM Short Term Goal #1 Met Date   09/08/15   Interventions for Short Term Goal #2   Educated on importance of always using cane, straight or quad to help with balance , prevent fall.   THN CM Short Term Goal #2 (0-30 days)  Patient will report doing chair exercises at least 3 days week in the next 30 days   THN CM Short Term Goal #2 Start Date  08/12/15   [facility] CM Short Term Goal #2 Met Date  09/08/15     Joylene Draft, RN, [place] Management [phone]- Mobile [phone]- [place]

## 2015-10-15 DIAGNOSIS — G2581 Restless legs syndrome: Secondary | ICD-10-CM | POA: Diagnosis not present

## 2015-10-15 DIAGNOSIS — G4733 Obstructive sleep apnea (adult) (pediatric): Secondary | ICD-10-CM | POA: Diagnosis not present

## 2015-10-15 DIAGNOSIS — R0602 Shortness of breath: Secondary | ICD-10-CM | POA: Diagnosis not present

## 2015-10-19 ENCOUNTER — Inpatient Hospital Stay
Admission: EM | Admit: 2015-10-19 | Discharge: 2015-10-21 | DRG: 065 | Disposition: A | Discharge status: Home Health | Payer: Commercial Managed Care - HMO | Attending: Internal Medicine | Admitting: Internal Medicine

## 2015-10-19 ENCOUNTER — Emergency Department: Payer: Commercial Managed Care - HMO

## 2015-10-19 ENCOUNTER — Encounter: Payer: Self-pay | Admitting: Emergency Medicine

## 2015-10-19 DIAGNOSIS — F341 Dysthymic disorder: Secondary | ICD-10-CM

## 2015-10-19 DIAGNOSIS — R296 Repeated falls: Secondary | ICD-10-CM | POA: Diagnosis present

## 2015-10-19 DIAGNOSIS — R4781 Slurred speech: Secondary | ICD-10-CM | POA: Diagnosis present

## 2015-10-19 DIAGNOSIS — K219 Gastro-esophageal reflux disease without esophagitis: Secondary | ICD-10-CM | POA: Diagnosis present

## 2015-10-19 DIAGNOSIS — Z91048 Other nonmedicinal substance allergy status: Secondary | ICD-10-CM

## 2015-10-19 DIAGNOSIS — I63233 Cerebral infarction due to unspecified occlusion or stenosis of bilateral carotid arteries: Secondary | ICD-10-CM | POA: Diagnosis not present

## 2015-10-19 DIAGNOSIS — Z794 Long term (current) use of insulin: Secondary | ICD-10-CM | POA: Diagnosis not present

## 2015-10-19 DIAGNOSIS — W19XXXA Unspecified fall, initial encounter: Secondary | ICD-10-CM | POA: Diagnosis present

## 2015-10-19 DIAGNOSIS — Z8249 Family history of ischemic heart disease and other diseases of the circulatory system: Secondary | ICD-10-CM | POA: Diagnosis not present

## 2015-10-19 DIAGNOSIS — R531 Weakness: Secondary | ICD-10-CM

## 2015-10-19 DIAGNOSIS — F609 Personality disorder, unspecified: Secondary | ICD-10-CM | POA: Diagnosis present

## 2015-10-19 DIAGNOSIS — S7001XA Contusion of right hip, initial encounter: Secondary | ICD-10-CM

## 2015-10-19 DIAGNOSIS — M25551 Pain in right hip: Secondary | ICD-10-CM | POA: Diagnosis not present

## 2015-10-19 DIAGNOSIS — S7000XA Contusion of unspecified hip, initial encounter: Secondary | ICD-10-CM | POA: Diagnosis present

## 2015-10-19 DIAGNOSIS — F329 Major depressive disorder, single episode, unspecified: Secondary | ICD-10-CM | POA: Diagnosis not present

## 2015-10-19 DIAGNOSIS — Z886 Allergy status to analgesic agent status: Secondary | ICD-10-CM | POA: Diagnosis not present

## 2015-10-19 DIAGNOSIS — R270 Ataxia, unspecified: Secondary | ICD-10-CM | POA: Diagnosis not present

## 2015-10-19 DIAGNOSIS — S7011XA Contusion of right thigh, initial encounter: Secondary | ICD-10-CM

## 2015-10-19 DIAGNOSIS — Z801 Family history of malignant neoplasm of trachea, bronchus and lung: Secondary | ICD-10-CM

## 2015-10-19 DIAGNOSIS — Z79899 Other long term (current) drug therapy: Secondary | ICD-10-CM | POA: Diagnosis not present

## 2015-10-19 DIAGNOSIS — R55 Syncope and collapse: Secondary | ICD-10-CM | POA: Diagnosis not present

## 2015-10-19 DIAGNOSIS — Z880 Allergy status to penicillin: Secondary | ICD-10-CM | POA: Diagnosis not present

## 2015-10-19 DIAGNOSIS — Z7982 Long term (current) use of aspirin: Secondary | ICD-10-CM

## 2015-10-19 DIAGNOSIS — Z955 Presence of coronary angioplasty implant and graft: Secondary | ICD-10-CM

## 2015-10-19 DIAGNOSIS — Z881 Allergy status to other antibiotic agents status: Secondary | ICD-10-CM

## 2015-10-19 DIAGNOSIS — Z803 Family history of malignant neoplasm of breast: Secondary | ICD-10-CM | POA: Diagnosis not present

## 2015-10-19 DIAGNOSIS — I639 Cerebral infarction, unspecified: Secondary | ICD-10-CM | POA: Diagnosis not present

## 2015-10-19 DIAGNOSIS — Z87891 Personal history of nicotine dependence: Secondary | ICD-10-CM

## 2015-10-19 DIAGNOSIS — M549 Dorsalgia, unspecified: Secondary | ICD-10-CM | POA: Diagnosis not present

## 2015-10-19 DIAGNOSIS — E119 Type 2 diabetes mellitus without complications: Secondary | ICD-10-CM | POA: Diagnosis present

## 2015-10-19 DIAGNOSIS — F4323 Adjustment disorder with mixed anxiety and depressed mood: Secondary | ICD-10-CM | POA: Diagnosis present

## 2015-10-19 DIAGNOSIS — I252 Old myocardial infarction: Secondary | ICD-10-CM | POA: Diagnosis not present

## 2015-10-19 DIAGNOSIS — Z6841 Body Mass Index (BMI) 40.0 and over, adult: Secondary | ICD-10-CM | POA: Diagnosis not present

## 2015-10-19 DIAGNOSIS — G4733 Obstructive sleep apnea (adult) (pediatric): Secondary | ICD-10-CM | POA: Diagnosis present

## 2015-10-19 DIAGNOSIS — M545 Low back pain: Secondary | ICD-10-CM | POA: Diagnosis present

## 2015-10-19 DIAGNOSIS — R52 Pain, unspecified: Secondary | ICD-10-CM

## 2015-10-19 DIAGNOSIS — S0990XA Unspecified injury of head, initial encounter: Secondary | ICD-10-CM | POA: Diagnosis not present

## 2015-10-19 DIAGNOSIS — Z7902 Long term (current) use of antithrombotics/antiplatelets: Secondary | ICD-10-CM

## 2015-10-19 DIAGNOSIS — I2510 Atherosclerotic heart disease of native coronary artery without angina pectoris: Secondary | ICD-10-CM | POA: Diagnosis not present

## 2015-10-19 DIAGNOSIS — Z79891 Long term (current) use of opiate analgesic: Secondary | ICD-10-CM | POA: Diagnosis not present

## 2015-10-19 DIAGNOSIS — Z882 Allergy status to sulfonamides status: Secondary | ICD-10-CM | POA: Diagnosis not present

## 2015-10-19 DIAGNOSIS — G8929 Other chronic pain: Secondary | ICD-10-CM | POA: Diagnosis present

## 2015-10-19 DIAGNOSIS — N400 Enlarged prostate without lower urinary tract symptoms: Secondary | ICD-10-CM | POA: Diagnosis not present

## 2015-10-19 DIAGNOSIS — Z85828 Personal history of other malignant neoplasm of skin: Secondary | ICD-10-CM

## 2015-10-19 DIAGNOSIS — I638 Other cerebral infarction: Secondary | ICD-10-CM | POA: Diagnosis not present

## 2015-10-19 DIAGNOSIS — Z888 Allergy status to other drugs, medicaments and biological substances status: Secondary | ICD-10-CM

## 2015-10-19 DIAGNOSIS — M1611 Unilateral primary osteoarthritis, right hip: Secondary | ICD-10-CM | POA: Diagnosis not present

## 2015-10-19 DIAGNOSIS — I10 Essential (primary) hypertension: Secondary | ICD-10-CM | POA: Diagnosis present

## 2015-10-19 DIAGNOSIS — Z841 Family history of disorders of kidney and ureter: Secondary | ICD-10-CM

## 2015-10-19 DIAGNOSIS — S79911A Unspecified injury of right hip, initial encounter: Secondary | ICD-10-CM | POA: Diagnosis not present

## 2015-10-19 LAB — BASIC METABOLIC PANEL
Anion gap: 12 (ref 5–15)
BUN: 23 mg/dL — ABNORMAL HIGH (ref 6–20)
CALCIUM: 9.3 mg/dL (ref 8.9–10.3)
CO2: 24 mmol/L (ref 22–32)
CREATININE: 1.46 mg/dL — AB (ref 0.61–1.24)
Chloride: 104 mmol/L (ref 101–111)
GFR, EST AFRICAN AMERICAN: 57 mL/min — AB (ref >60)
GFR, EST NON AFRICAN AMERICAN: 49 mL/min — AB (ref >60)
Glucose, Bld: 190 mg/dL — ABNORMAL HIGH (ref 65–99)
Potassium: 3.9 mmol/L (ref 3.5–5.1)
SODIUM: 140 mmol/L (ref 135–145)

## 2015-10-19 LAB — LIPID PANEL
Cholesterol: 223 mg/dL — ABNORMAL HIGH (ref 0–200)
HDL: 32 mg/dL — ABNORMAL LOW (ref >40)
LDL CALC: UNDETERMINED mg/dL (ref 0–99)
TRIGLYCERIDES: 600 mg/dL — AB (ref <150)
Total CHOL/HDL Ratio: 7 RATIO
VLDL: UNDETERMINED mg/dL (ref 0–40)

## 2015-10-19 LAB — GLUCOSE, CAPILLARY: GLUCOSE-CAPILLARY: 236 mg/dL — AB (ref 65–99)

## 2015-10-19 LAB — CBC
HCT: 39.5 % — ABNORMAL LOW (ref 40.0–52.0)
HEMOGLOBIN: 12.8 g/dL — AB (ref 13.0–18.0)
MCH: 25.2 pg — ABNORMAL LOW (ref 26.0–34.0)
MCHC: 32.5 g/dL (ref 32.0–36.0)
MCV: 77.4 fL — ABNORMAL LOW (ref 80.0–100.0)
Platelets: 241 10*3/uL (ref 150–440)
RBC: 5.1 MIL/uL (ref 4.40–5.90)
RDW: 18.4 % — AB (ref 11.5–14.5)
WBC: 5.5 10*3/uL (ref 3.8–10.6)

## 2015-10-19 LAB — URINALYSIS COMPLETE WITH MICROSCOPIC (ARMC ONLY)
Bacteria, UA: NONE SEEN
Bilirubin Urine: NEGATIVE
GLUCOSE, UA: 50 mg/dL — AB
Hgb urine dipstick: NEGATIVE
KETONES UR: NEGATIVE mg/dL
Leukocytes, UA: NEGATIVE
Nitrite: NEGATIVE
Protein, ur: NEGATIVE mg/dL
RBC / HPF: NONE SEEN RBC/hpf (ref 0–5)
SPECIFIC GRAVITY, URINE: 1.021 (ref 1.005–1.030)
pH: 6 (ref 5.0–8.0)

## 2015-10-19 LAB — LACTIC ACID, PLASMA
Lactic Acid, Venous: 2.1 mmol/L (ref 0.5–2.0)
Lactic Acid, Venous: 1 mmol/L (ref 0.5–2.0)

## 2015-10-19 LAB — TROPONIN I

## 2015-10-19 MED ORDER — SODIUM CHLORIDE 0.9% FLUSH
3.0000 mL | Freq: Two times a day (BID) | INTRAVENOUS | Status: DC
  Administered 2015-10-19 – 2015-10-21 (×4): 3 mL via INTRAVENOUS

## 2015-10-19 MED ORDER — ACETAMINOPHEN 325 MG PO TABS: 650.0000 mg | ORAL_TABLET | Freq: Four times a day (QID) | ORAL | Status: DC | PRN

## 2015-10-19 MED ORDER — INSULIN ASPART 100 UNIT/ML ~~LOC~~ SOLN
0.0000 [IU] | Freq: Three times a day (TID) | SUBCUTANEOUS | Status: DC
  Administered 2015-10-20: 5 [IU] via SUBCUTANEOUS
  Administered 2015-10-20: 3 [IU] via SUBCUTANEOUS
  Administered 2015-10-20 – 2015-10-21 (×2): 2 [IU] via SUBCUTANEOUS
  Administered 2015-10-21 (×2): 3 [IU] via SUBCUTANEOUS
  Filled 2015-10-19: qty 3
  Filled 2015-10-19: qty 2
  Filled 2015-10-19: qty 3
  Filled 2015-10-19: qty 5
  Filled 2015-10-19: qty 2
  Filled 2015-10-19: qty 3

## 2015-10-19 MED ORDER — FLUTICASONE PROPIONATE 50 MCG/ACT NA SUSP
2.0000 | Freq: Every day | NASAL | Status: DC
  Administered 2015-10-19 – 2015-10-21 (×3): 2 via NASAL
  Filled 2015-10-19: qty 16

## 2015-10-19 MED ORDER — OXCARBAZEPINE 300 MG PO TABS
300.0000 mg | ORAL_TABLET | Freq: Every morning | ORAL | Status: DC
  Administered 2015-10-20 – 2015-10-21 (×2): 300 mg via ORAL
  Filled 2015-10-19 (×3): qty 1

## 2015-10-19 MED ORDER — ENOXAPARIN SODIUM 40 MG/0.4ML ~~LOC~~ SOLN
40.0000 mg | Freq: Two times a day (BID) | SUBCUTANEOUS | Status: DC
  Administered 2015-10-19 – 2015-10-21 (×4): 40 mg via SUBCUTANEOUS
  Filled 2015-10-19 (×3): qty 0.4

## 2015-10-19 MED ORDER — ASPIRIN 81 MG PO CHEW
324.0000 mg | CHEWABLE_TABLET | Freq: Once | ORAL | Status: AC
  Administered 2015-10-19: 243 mg via ORAL
  Filled 2015-10-19: qty 4

## 2015-10-19 MED ORDER — OXYCODONE HCL 5 MG PO TABS: 5.0000 mg | ORAL_TABLET | Freq: Three times a day (TID) | ORAL | Status: DC | PRN

## 2015-10-19 MED ORDER — INSULIN ASPART 100 UNIT/ML ~~LOC~~ SOLN
0.0000 [IU] | Freq: Every day | SUBCUTANEOUS | Status: DC
  Administered 2015-10-19 – 2015-10-20 (×2): 2 [IU] via SUBCUTANEOUS
  Filled 2015-10-19 (×2): qty 2

## 2015-10-19 MED ORDER — ONDANSETRON HCL 4 MG/2ML IJ SOLN: 4.0000 mg | Freq: Four times a day (QID) | INTRAMUSCULAR | Status: DC | PRN

## 2015-10-19 MED ORDER — PANTOPRAZOLE SODIUM 40 MG PO TBEC
40.0000 mg | DELAYED_RELEASE_TABLET | Freq: Every day | ORAL | Status: DC
  Administered 2015-10-19 – 2015-10-21 (×3): 40 mg via ORAL
  Filled 2015-10-19 (×3): qty 1

## 2015-10-19 MED ORDER — ACETAMINOPHEN 650 MG RE SUPP: 650.0000 mg | Freq: Four times a day (QID) | RECTAL | Status: DC | PRN

## 2015-10-19 MED ORDER — INSULIN GLARGINE 100 UNIT/ML ~~LOC~~ SOLN
60.0000 [IU] | Freq: Every day | SUBCUTANEOUS | Status: DC
  Administered 2015-10-19 – 2015-10-20 (×2): 60 [IU] via SUBCUTANEOUS
  Filled 2015-10-19 (×3): qty 0.6

## 2015-10-19 MED ORDER — OXCARBAZEPINE 300 MG PO TABS
600.0000 mg | ORAL_TABLET | Freq: Every evening | ORAL | Status: DC
  Administered 2015-10-19 – 2015-10-21 (×3): 600 mg via ORAL
  Filled 2015-10-19 (×3): qty 2

## 2015-10-19 MED ORDER — TAMSULOSIN HCL 0.4 MG PO CAPS
0.4000 mg | ORAL_CAPSULE | Freq: Every day | ORAL | Status: DC
  Administered 2015-10-20 – 2015-10-21 (×2): 0.4 mg via ORAL
  Filled 2015-10-19 (×2): qty 1

## 2015-10-19 MED ORDER — HYDROCHLOROTHIAZIDE 12.5 MG PO CAPS
12.5000 mg | ORAL_CAPSULE | Freq: Every day | ORAL | Status: DC
  Administered 2015-10-19 – 2015-10-21 (×3): 12.5 mg via ORAL
  Filled 2015-10-19 (×3): qty 1

## 2015-10-19 MED ORDER — OXYCODONE HCL ER 20 MG PO T12A
20.0000 mg | EXTENDED_RELEASE_TABLET | Freq: Two times a day (BID) | ORAL | Status: DC
  Administered 2015-10-19 – 2015-10-21 (×4): 20 mg via ORAL
  Filled 2015-10-19 (×4): qty 1

## 2015-10-19 MED ORDER — BENAZEPRIL HCL 20 MG PO TABS
40.0000 mg | ORAL_TABLET | Freq: Two times a day (BID) | ORAL | Status: DC
  Administered 2015-10-19 – 2015-10-21 (×4): 40 mg via ORAL
  Filled 2015-10-19 (×4): qty 2

## 2015-10-19 MED ORDER — BUSPIRONE HCL 5 MG PO TABS
10.0000 mg | ORAL_TABLET | Freq: Every day | ORAL | Status: DC
  Administered 2015-10-19 – 2015-10-20 (×2): 10 mg via ORAL
  Filled 2015-10-19 (×2): qty 2

## 2015-10-19 MED ORDER — ONDANSETRON HCL 4 MG PO TABS: 4.0000 mg | ORAL_TABLET | Freq: Four times a day (QID) | ORAL | Status: DC | PRN

## 2015-10-19 MED ORDER — PREGABALIN 75 MG PO CAPS
200.0000 mg | ORAL_CAPSULE | Freq: Every evening | ORAL | Status: DC
  Administered 2015-10-19 – 2015-10-21 (×3): 200 mg via ORAL
  Filled 2015-10-19 (×4): qty 1

## 2015-10-19 MED ORDER — CLOPIDOGREL BISULFATE 75 MG PO TABS
75.0000 mg | ORAL_TABLET | Freq: Every day | ORAL | Status: DC
  Administered 2015-10-19 – 2015-10-21 (×3): 75 mg via ORAL
  Filled 2015-10-19 (×3): qty 1

## 2015-10-19 MED ORDER — FENOFIBRATE 54 MG PO TABS
54.0000 mg | ORAL_TABLET | Freq: Every evening | ORAL | Status: DC
  Administered 2015-10-19: 54 mg via ORAL
  Filled 2015-10-19: qty 1

## 2015-10-19 MED ORDER — DULOXETINE HCL 30 MG PO CPEP
30.0000 mg | ORAL_CAPSULE | Freq: Two times a day (BID) | ORAL | Status: DC
  Administered 2015-10-19 – 2015-10-21 (×4): 30 mg via ORAL
  Filled 2015-10-19 (×5): qty 1

## 2015-10-19 MED ORDER — METAXALONE 400 MG HALF TABLET
400.0000 mg | ORAL_TABLET | Freq: Three times a day (TID) | ORAL | Status: DC | PRN
  Filled 2015-10-19: qty 1

## 2015-10-19 MED ORDER — SODIUM CHLORIDE 0.9 % IV SOLN
1000.0000 mL | Freq: Once | INTRAVENOUS | Status: AC
  Administered 2015-10-19: 1000 mL via INTRAVENOUS

## 2015-10-19 MED ORDER — SODIUM CHLORIDE 0.9 % IV SOLN
INTRAVENOUS | Status: AC
  Administered 2015-10-19 – 2015-10-20 (×2): via INTRAVENOUS

## 2015-10-19 MED ORDER — ASPIRIN EC 81 MG PO TBEC
81.0000 mg | DELAYED_RELEASE_TABLET | Freq: Every day | ORAL | Status: DC
Start: 2015-10-19 — End: 2015-10-21
  Administered 2015-10-19 – 2015-10-21 (×3): 81 mg via ORAL
  Filled 2015-10-19 (×4): qty 1

## 2015-10-19 MED ORDER — AMLODIPINE BESYLATE 10 MG PO TABS
10.0000 mg | ORAL_TABLET | Freq: Every day | ORAL | Status: DC
  Administered 2015-10-19 – 2015-10-21 (×3): 10 mg via ORAL
  Filled 2015-10-19 (×3): qty 1

## 2015-10-19 MED ORDER — BUPROPION HCL ER (XL) 300 MG PO TB24
300.0000 mg | ORAL_TABLET | Freq: Every day | ORAL | Status: DC
Start: 2015-10-19 — End: 2015-10-21
  Administered 2015-10-19 – 2015-10-21 (×3): 300 mg via ORAL
  Filled 2015-10-19 (×3): qty 1

## 2015-10-19 MED ORDER — PREGABALIN 50 MG PO CAPS
400.0000 mg | ORAL_CAPSULE | Freq: Every morning | ORAL | Status: DC
  Administered 2015-10-20 – 2015-10-21 (×2): 400 mg via ORAL
  Filled 2015-10-19: qty 8
  Filled 2015-10-19: qty 2
  Filled 2015-10-19: qty 8

## 2015-10-19 NOTE — ED Provider Notes (Signed)
[facility] Emergency Department Provider Note        Time seen: ----------------------------------------- 4:03 PM on 10/19/2015 -----------------------------------------    I have reviewed the triage vital signs and the nursing notes.   HISTORY  Chief Complaint Fall and Hip Pain    HPI Victor Wise is a 65 y.o. male who presents to ER after a fall on Friday and Saturday. Family states his been falling a lot, they are not sure why. Patient states she just blacked out when he went for a step. He denies hitting his head, just complaining of right hip pain this time. Patient states he fell every day except Sunday, has pain with standing and the right hip. Denies recent illness, family states is more pale than normal.   Past Medical History  Diagnosis Date  . Diabetes ([place])   . Hypertension   . BPH (benign prostatic hyperplasia)   . 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 Morbid obesity ([place])   . Obstructive sleep apnea   . Chronic back pain   . Depression   . UTI (lower urinary tract infection)   . Status post insertion of spinal cord stimulator   . Heart attack ([place])   . Stroke ([place])   . Basal cell carcinoma     forehead  . GERD (gastroesophageal reflux disease)   . Allergy     Patient Active Problem List   Diagnosis Date Noted  . Urinary retention 03/25/2015  . 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 Acute MI, anterolateral wall, subsequent episode of care ([place]) 12/19/2014  . SIRS (systemic inflammatory response syndrome) ([place]) 12/18/2014    Past Surgical History  Procedure Laterality Date  . Pain stimulator    . Right toe amputation    . Cardiac catheterization N/A 12/19/2014    Procedure: Coronary Stent Intervention;  Surgeon: Charolette Forward, MD;  Location: [place] [facility];  Service: Cardiovascular;  Laterality: N/A;  . Cardiac catheterization Left 12/19/2014    Procedure: Left Heart Cath and Coronary Angiography;  Surgeon: Dionisio Yosef, MD;   Location: [place] [facility];  Service: Cardiovascular;  Laterality: Left;    Allergies Amoxicillin; Other; Hydralazine; Crestor; Metoprolol; Rosuvastatin; Statins; Sulfa antibiotics; Yellow dyes (non-tartrazine); Aspirin; Penicillins; and Tape  Social History Social History  Substance Use Topics  . Smoking status: Former Smoker    Types: Cigarettes  . Smokeless tobacco: None     Comment: quit 45 years  . Alcohol Use: 0.0 oz/week    0 Standard drinks or equivalent per week     Comment: occasionally    Review of Systems Constitutional: Negative for fever. Eyes: Negative for visual changes. ENT: Negative for sore throat. Cardiovascular: Negative for chest pain.Positive for lightheadedness Respiratory: Negative for shortness of breath. Gastrointestinal: Negative for abdominal pain, vomiting and diarrhea. Genitourinary: Negative for dysuria. Musculoskeletal: Positive for right hip pain Skin: Positive for pallor Neurological: Negative for headaches, focal weakness or numbness.  10-point ROS otherwise negative.  ____________________________________________   PHYSICAL EXAM:  VITAL SIGNS: ED Triage Vitals  Enc Vitals Group     BP 10/19/15 1546 129/65 mmHg     Pulse Rate 10/19/15 1546 73     Resp 10/19/15 1546 22     Temp 10/19/15 1546 97.6 F (36.4 C)     Temp Source 10/19/15 1546 Oral     SpO2 10/19/15 1546 94 %     Weight 10/19/15 1546 320 lb (145.151 kg)     Height 10/19/15 1546  5' 8.5" (1.74 m)     Head Cir --      Peak Flow --      Pain Score 10/19/15 1550 0     Pain Loc --      Pain Edu? --      Excl. in [place]? --     Constitutional: Alert and oriented. Generally ill-appearing, no acute distress Eyes: Conjunctivae are normal. PERRL. Normal extraocular movements. ENT   Head: Normocephalic and atraumatic.   Nose: No congestion/rhinnorhea.   Mouth/Throat: Mucous membranes are moist.   Neck: No stridor. Cardiovascular: Normal rate, regular rhythm.  No murmurs, rubs, or gallops. Respiratory: Normal respiratory effort without tachypnea nor retractions. Breath sounds are clear and equal bilaterally. No wheezes/rales/rhonchi. Gastrointestinal: Soft and nontender. Normal bowel sounds Musculoskeletal: Right hip is tender, minimal pain with range of motion of the right hip. Neurologic:  Normal speech and language. No gross focal neurologic deficits are appreciated.  Skin:  Skin is warm, dry and intact. Pallor is noted, questionable diaphoresis Psychiatric: Mood and affect are normal. Speech and behavior are normal.  ____________________________________________  EKG: Interpreted by me. Sinus rhythm rate of 74 bpm, left axis deviation, old inferior and anterior infarct. No evidence of acute infarction  ____________________________________________  ED COURSE:  Pertinent labs & imaging results that were available during my care of the patient were reviewed by me and considered in my medical decision making (see chart for details). Patient presents to ER after a fall and weakness with possible syncope. We will check basic labs and reevaluate. ____________________________________________    LABS (pertinent positives/negatives)  Labs Reviewed  BASIC METABOLIC PANEL - Abnormal; Notable for the following:    Glucose, Bld 190 (*)    BUN 23 (*)    Creatinine, Ser 1.46 (*)    GFR calc non Af Amer 49 (*)    GFR calc Af Amer 57 (*)    All other components within normal limits  CBC - Abnormal; Notable for the following:    Hemoglobin 12.8 (*)    HCT 39.5 (*)    MCV 77.4 (*)    MCH 25.2 (*)    RDW 18.4 (*)    All other components within normal limits  LACTIC ACID, PLASMA - Abnormal; Notable for the following:    Lactic Acid, Venous 2.1 (*)    All other components within normal limits  TROPONIN I  URINALYSIS COMPLETEWITH MICROSCOPIC ([facility] ONLY)  LACTIC ACID, PLASMA    RADIOLOGY Images were viewed by me  CT head, right hip  x-rays IMPRESSION: No acute abnormality noted. IMPRESSION: Relatively well marginated low-attenuation focus in the left frontal corona radiata/left basal ganglia, new since 12/18/2014, most suggestive of a subacute infarct. No significant mass effect. No acute intracranial hemorrhage.  These results were called by telephone at the time of interpretation on 10/19/2015 at 5:27 pm to [place], who verbally acknowledged these results. ____________________________________________  FINAL ASSESSMENT AND PLAN  Fall, weakness, near syncope, right hip pain, subacute infarct  Plan: Patient with labs and imaging as dictated above. Patient presents to ER with fall and weakness. Symptoms likely secondary to subacute infarct. He was given saline bolus here. Patient will be given an adult aspirin, I'll discuss with the hospitalist for admission. He will also need assessment by physical therapy.   Earleen Newport, MD   Note: This dictation was prepared with Dragon dictation. Any transcriptional errors that result from this process are unintentional   Earleen Newport, MD 10/19/15  1753 

## 2015-10-19 NOTE — ED Notes (Signed)
Pt presents to ED c/o fall when he was in the kitchen, "I just blacked out when I went for a step". Pt was with family at the time. Denies hitting head, but has right hip pain. "I feel everyday except Sunday". Pt has pain only when he stands up.

## 2015-10-19 NOTE — ED Notes (Addendum)
Gave critical CT result to Dr Jimmye Norman regarding subacute left ganglia infarct at 1734. Contact number for MD on chart

## 2015-10-19 NOTE — H&P (Signed)
[place] [facility] [place] NAME: Victor Wise    MR#:  [id number]  DATE OF BIRTH:  [birth year]  DATE OF ADMISSION:  10/19/2015  PRIMARY CARE PHYSICIAN: Lavera Guise, MD   REQUESTING/REFERRING PHYSICIAN: Dr. Lenise Arena  CHIEF COMPLAINT:   Chief Complaint  Patient presents with  . Fall  . Hip Pain    HISTORY OF PRESENT ILLNESS:  Victor Wise  is a 65 y.o. male with a known history of Obesity, prior strokes, ataxia, hypertension, insulin-dependent diabetes mellitus, CAD status post prior stents presents to hospital secondary to weakness, balance problems and also fall today. Patient follows with Dr. Manuella Ghazi, [facility] neurology as outpatient. Had a prior subacute infarct and also has balance problems with falls in the past. He was at rehabilitation last year and was discharged home. Lately in the last week he has been having more falls than baseline. His speech was noted to be slurred this afternoon and also had a fall according to sister and so brought to the emergency room. Denies any chest pain. No aura prior to the fall. Did not lose consciousness. No fevers, chills, nausea or vomiting. CT of the head here showing left basal ganglia 7 acute infarct. No acute intracranial hemorrhage noted. Patient cannot get an MRI due to spinal stimulator implantation.  PAST MEDICAL HISTORY:   Past Medical History  Diagnosis Date  . Diabetes ([place])   . Hypertension   . BPH (benign prostatic hyperplasia)   . 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 Morbid obesity ([place])   . Obstructive sleep apnea   . Chronic back pain   . Depression   . UTI (lower urinary tract infection)   . Status post insertion of spinal cord stimulator   . Heart attack ([place])   . Stroke ([place])   . Basal cell carcinoma     forehead  . GERD (gastroesophageal reflux disease)   . Allergy     PAST SURGICAL HISTORY:   Past Surgical History  Procedure Laterality Date  . Pain stimulator    .  Right toe amputation    . Cardiac catheterization N/A 12/19/2014    Procedure: Coronary Stent Intervention;  Surgeon: Charolette Forward, MD;  Location: [place] [facility];  Service: Cardiovascular;  Laterality: N/A;  . Cardiac catheterization Left 12/19/2014    Procedure: Left Heart Cath and Coronary Angiography;  Surgeon: Dionisio Gotti, MD;  Location: [place] [facility];  Service: Cardiovascular;  Laterality: Left;    SOCIAL HISTORY:   Social History  Substance Use Topics  . Smoking status: Former Smoker    Types: Cigarettes  . Smokeless tobacco: Not on file     Comment: quit 45 years  . Alcohol Use: 0.0 oz/week    0 Standard drinks or equivalent per week     Comment: occasionally    FAMILY HISTORY:   Family History  Problem Relation Age of Onset  . Breast cancer Mother   . Cancer Mother   . Hypertension Mother   . Lung cancer Father   . Hypertension Father   . Heart disease Father   . Cancer Father   . Kidney disease Sister   . Prostate cancer Neg Hx     DRUG ALLERGIES:   Allergies  Allergen Reactions  . Amoxicillin Shortness Of Breath, Itching and Other (See Comments)    Has patient had a PCN reaction causing immediate rash, facial/tongue/throat swelling, SOB or lightheadedness with hypotension: Yes Has  patient had a PCN reaction causing severe rash involving mucus membranes or skin necrosis: No Has patient had a PCN reaction that required hospitalization No Has patient had a PCN reaction occurring within the last 10 years: No If all of the above answers are "NO", then may proceed with Cephalosporin use.  . Other Anaphylaxis, Itching and Other (See Comments)    Pt states that he is allergic to Endopa.  Reaction:  Anaphylaxis  Pt states that he is allergic to Metabisulfites Reaction:  Itching   . Penicillins Shortness Of Breath, Itching and Other (See Comments)    Has patient had a PCN reaction causing immediate rash, facial/tongue/throat swelling, SOB or lightheadedness  with hypotension: Yes Has patient had a PCN reaction causing severe rash involving mucus membranes or skin necrosis: No Has patient had a PCN reaction that required hospitalization No Has patient had a PCN reaction occurring within the last 10 years: No If all of the above answers are "NO", then may proceed with Cephalosporin use.  [name] Hydralazine Other (See Comments)    Reaction:  Cramping of extremities  . Crestor [Rosuvastatin Calcium] Other (See Comments)    Reaction:  Pt is unable to move arms/legs   . Metoprolol Nausea And Vomiting  . Statins Other (See Comments)    Reaction:  Pt is unable to move arms/legs  . Sulfa Antibiotics Itching  . Yellow Dyes (Non-Tartrazine) Itching  . Aspirin Itching and Other (See Comments)    Pt states that he is able to use in lower doses.    . Tape Rash    REVIEW OF SYSTEMS:   Review of Systems  Constitutional: Positive for malaise/fatigue. Negative for fever, chills and weight loss.  HENT: Negative for ear discharge, ear pain, hearing loss and nosebleeds.   Eyes: Negative for blurred vision, double vision and photophobia.  Respiratory: Positive for cough. Negative for hemoptysis, shortness of breath and wheezing.   Cardiovascular: Negative for chest pain, palpitations, orthopnea and leg swelling.  Gastrointestinal: Negative for heartburn, nausea, vomiting, abdominal pain, diarrhea, constipation and melena.  Genitourinary: Negative for dysuria, urgency, frequency and hematuria.  Musculoskeletal: Positive for myalgias and joint pain. Negative for back pain and neck pain.  Skin: Negative for rash.  Neurological: Positive for dizziness and speech change. Negative for tingling, tremors, sensory change, focal weakness and headaches.       Global weakness  Endo/Heme/Allergies: Does not bruise/bleed easily.  Psychiatric/Behavioral: Negative for depression.    MEDICATIONS AT HOME:   Prior to Admission medications   Medication Sig Start Date End Date  Taking? Authorizing Provider  amLODipine (NORVASC) 10 MG tablet Take 10 mg by mouth daily.   Yes Historical Provider, MD  aspirin EC 81 MG tablet Take 81 mg by mouth daily.   Yes Historical Provider, MD  benazepril (LOTENSIN) 40 MG tablet Take 40 mg by mouth 2 (two) times daily.    Yes Historical Provider, MD  buPROPion (WELLBUTRIN XL) 150 MG 24 hr tablet Take 300 mg by mouth daily.    Yes Historical Provider, MD  busPIRone (BUSPAR) 10 MG tablet Take 10 mg by mouth at bedtime.   Yes Historical Provider, MD  cetirizine (ZYRTEC) 10 MG tablet Take 10 mg by mouth at bedtime as needed for allergies.    Yes Historical Provider, MD  clopidogrel (PLAVIX) 75 MG tablet Take 75 mg by mouth daily.   Yes Historical Provider, MD  DULoxetine (CYMBALTA) 30 MG capsule Take 30-60 mg by mouth 2 (two) times daily.  Pt takes two capsules in the morning and one at night.   Yes Historical Provider, MD  Exenatide ER (BYDUREON) 2 MG PEN Inject 2 mg into the skin once a week. Pt uses on Sunday.   Yes Historical Provider, MD  fenofibrate 54 MG tablet Take 54 mg by mouth every evening.   Yes Historical Provider, MD  fluticasone (FLONASE) 50 MCG/ACT nasal spray Place 2 sprays into both nostrils daily.   Yes Historical Provider, MD  hydrochlorothiazide (MICROZIDE) 12.5 MG capsule Take 12.5 mg by mouth daily.   Yes Historical Provider, MD  insulin glargine (LANTUS) 100 UNIT/ML injection Inject 60 Units into the skin at bedtime.   Yes Historical Provider, MD  insulin regular (NOVOLIN R,HUMULIN R) 100 units/mL injection Inject 0-15 Units into the skin 3 (three) times daily with meals as needed for high blood sugar. Pt uses as needed per sliding scale.   Yes Historical Provider, MD  metaxalone (SKELAXIN) 800 MG tablet Take 400-800 mg by mouth 3 (three) times daily as needed for muscle spasms.   Yes Historical Provider, MD  NARCAN 4 MG/0.1ML LIQD Take 4 mg by mouth as needed (for opioid overdose).    Yes Historical Provider, MD   nitroGLYCERIN (NITROSTAT) 0.4 MG SL tablet Place 1 tablet (0.4 mg total) under the tongue every 5 (five) minutes x 3 doses as needed for chest pain. 12/21/14  Yes Dixie Dials, MD  Oxcarbazepine (TRILEPTAL) 300 MG tablet Take 300-600 mg by mouth 2 (two) times daily. Pt takes one tablet in the morning and two at night.   Yes Historical Provider, MD  oxyCODONE (OXY IR/ROXICODONE) 5 MG immediate release tablet Take 5 mg by mouth 3 (three) times daily as needed for severe pain.    Yes Historical Provider, MD  OxyCODONE (OXYCONTIN) 20 mg T12A 12 hr tablet Take 20 mg by mouth every 12 (twelve) hours.    Yes Historical Provider, MD  pantoprazole (PROTONIX) 40 MG tablet Take 40 mg by mouth daily.   Yes Historical Provider, MD  pregabalin (LYRICA) 200 MG capsule Take 200-400 mg by mouth 2 (two) times daily. Pt takes two capsules in the morning and one at night.   Yes Historical Provider, MD  tamsulosin (FLOMAX) 0.4 MG CAPS capsule Take 0.4 mg by mouth daily after supper.    Yes Historical Provider, MD      VITAL SIGNS:  Blood pressure 129/65, pulse 73, temperature 97.6 F (36.4 C), temperature source Oral, resp. rate 22, height 5' 8.5" (1.74 m), weight 145.151 kg (320 lb), SpO2 94 %.  PHYSICAL EXAMINATION:   Physical Exam  GENERAL:  65 y.o.-year-old  Obese patient lying in the bed with no acute distress.  EYES: Pupils equal, round, reactive to light and accommodation. No scleral icterus. Extraocular muscles intact.  HEENT: Head atraumatic, normocephalic. Oropharynx and nasopharynx clear.  NECK:  Supple, no jugular venous distention. No thyroid enlargement, no tenderness.  LUNGS: Normal breath sounds bilaterally, no wheezing, rales,rhonchi or crepitation. No use of accessory muscles of respiration. Decreased bibasilar breath sounds CARDIOVASCULAR: S1, S2 normal. No murmurs, rubs, or gallops.  ABDOMEN: Soft, nontender, nondistended. Bowel sounds present. No organomegaly or mass.  EXTREMITIES: No pedal  edema, cyanosis, or clubbing.  NEUROLOGIC: Cranial nerves II through XII are intact. Muscle strength 5/5 in all extremities except proximal right upper extremity, movement is restricted due to rotator cuff tear. Sensation intact. Gait not checked.  PSYCHIATRIC: The patient is alert and oriented x 3.  SKIN: No obvious rash,  lesion, or ulcer.   LABORATORY PANEL:   CBC  Recent Labs Lab 10/19/15 1555  WBC 5.5  HGB 12.8*  HCT 39.5*  PLT 241   ------------------------------------------------------------------------------------------------------------------  Chemistries   Recent Labs Lab 10/19/15 1555  NA 140  K 3.9  CL 104  CO2 24  GLUCOSE 190*  BUN 23*  CREATININE 1.46*  CALCIUM 9.3   ------------------------------------------------------------------------------------------------------------------  Cardiac Enzymes  Recent Labs Lab 10/19/15 1555  TROPONINI <0.03   ------------------------------------------------------------------------------------------------------------------  RADIOLOGY:  Ct Head Wo Contrast  10/19/2015  CLINICAL DATA:  Weakness.  Fall. EXAM: CT HEAD WITHOUT CONTRAST TECHNIQUE: Contiguous axial images were obtained from the base of the skull through the vertex without intravenous contrast. COMPARISON:  12/18/2014 head CT. FINDINGS: There is a relatively well marginated low-attenuation focus in the left frontal corona radiata extending into the left putamen, new since 12/18/2014. No significant mass-effect. No evidence of parenchymal hemorrhage or extra-axial fluid collection. No midline shift. Intracranial atherosclerosis. Cerebral volume is age appropriate. No ventriculomegaly. The visualized paranasal sinuses are essentially clear. The mastoid air cells are unopacified. No evidence of calvarial fracture. IMPRESSION: Relatively well marginated low-attenuation focus in the left frontal corona radiata/left basal ganglia, new since 12/18/2014, most suggestive of  a subacute infarct. No significant mass effect. No acute intracranial hemorrhage. These results were called by telephone at the time of interpretation on 10/19/2015 at 5:27 pm to [place], who verbally acknowledged these results. Electronically Signed   By: Ilona Sorrel M.D.   On: 10/19/2015 17:33   Dg Hip Unilat  With Pelvis 2-3 Views Right  10/19/2015  CLINICAL DATA:  Fall 2 days ago from standing position with right hip pain, initial encounter EXAM: DG HIP (WITH OR WITHOUT PELVIS) 2-3V RIGHT COMPARISON:  None. FINDINGS: Mild degenerative changes in the lumbar spine are seen. The pelvic ring is intact. No acute fracture or dislocation is noted. A stimulating device is noted over the left iliac wing. IMPRESSION: No acute abnormality noted. Electronically Signed   By: Inez Catalina M.D.   On: 10/19/2015 17:38    EKG:   Orders placed or performed during the hospital encounter of 10/19/15  . ED EKG  . ED EKG  . EKG 12-Lead  . EKG 12-Lead    IMPRESSION AND PLAN:   Pete Ciolli  is a 65 y.o. male with a known history of Obesity, prior strokes, ataxia, hypertension, insulin-dependent diabetes mellitus, CAD status post prior stents presents to hospital secondary to weakness, balance problems and also fall today.  #1 acute CVA- Presenting as balance problems and falls. Also prior history of ataxia and multiple falls in the past. No focal weakness noted. Cannot get an MRI done due to his spinal stimulator. -Admit, off unit telemetry. Neuro checks. -Continue aspirin and Plavix. Patient is allergic to statins. -Check lipid panel. Physical therapy, occupational therapy and speech therapy consults.  #2 hypertension-On hydrochlorothiazide, benazepril, Norvasc.  #3 chronic low back pain- cont pain meds  #4 diabetes mellitus-Continue Lantus and added sliding scale insulin. Also on [place] at home- not on formulary here.  #5 GERD- protonix  #6 CAD-Stable. Continue cardiac medications  #7 DVT  Prophylaxis- lovenox    All the records are reviewed and case discussed with ED provider. Management plans discussed with the patient, family and they are in agreement.  CODE STATUS: Full Code  TOTAL TIME TAKING CARE OF THIS PATIENT: 50 minutes.    Gladstone Lighter M.D on 10/19/2015 at 6:56 PM  Between 7am to 6pm -  Pager - [phone]  After 6pm go to [email or web address] - password [email or web address] [place] [facility]  Office  [phone]  CC: Primary care physician; Lavera Guise, MD

## 2015-10-20 ENCOUNTER — Inpatient Hospital Stay
Admit: 2015-10-20 | Discharge: 2015-10-20 | Disposition: A | Discharge status: Home / Self Care | Payer: Commercial Managed Care - HMO | Attending: Internal Medicine | Admitting: Internal Medicine

## 2015-10-20 ENCOUNTER — Inpatient Hospital Stay: Payer: Commercial Managed Care - HMO

## 2015-10-20 DIAGNOSIS — F4323 Adjustment disorder with mixed anxiety and depressed mood: Secondary | ICD-10-CM

## 2015-10-20 DIAGNOSIS — I639 Cerebral infarction, unspecified: Principal | ICD-10-CM

## 2015-10-20 DIAGNOSIS — F341 Dysthymic disorder: Secondary | ICD-10-CM

## 2015-10-20 LAB — BASIC METABOLIC PANEL
ANION GAP: 7 (ref 5–15)
BUN: 24 mg/dL — ABNORMAL HIGH (ref 6–20)
CALCIUM: 8.8 mg/dL — AB (ref 8.9–10.3)
CO2: 28 mmol/L (ref 22–32)
CREATININE: 1.19 mg/dL (ref 0.61–1.24)
Chloride: 106 mmol/L (ref 101–111)
Glucose, Bld: 168 mg/dL — ABNORMAL HIGH (ref 65–99)
Potassium: 3.5 mmol/L (ref 3.5–5.1)
SODIUM: 141 mmol/L (ref 135–145)

## 2015-10-20 LAB — GLUCOSE, CAPILLARY
GLUCOSE-CAPILLARY: 210 mg/dL — AB (ref 65–99)
GLUCOSE-CAPILLARY: 244 mg/dL — AB (ref 65–99)
Glucose-Capillary: 190 mg/dL — ABNORMAL HIGH (ref 65–99)
Glucose-Capillary: 259 mg/dL — ABNORMAL HIGH (ref 65–99)

## 2015-10-20 LAB — CBC
HCT: 36.9 % — ABNORMAL LOW (ref 40.0–52.0)
HEMOGLOBIN: 12 g/dL — AB (ref 13.0–18.0)
MCH: 25.7 pg — AB (ref 26.0–34.0)
MCHC: 32.5 g/dL (ref 32.0–36.0)
MCV: 79.2 fL — ABNORMAL LOW (ref 80.0–100.0)
PLATELETS: 197 10*3/uL (ref 150–440)
RBC: 4.66 MIL/uL (ref 4.40–5.90)
RDW: 18.5 % — ABNORMAL HIGH (ref 11.5–14.5)
WBC: 5 10*3/uL (ref 3.8–10.6)

## 2015-10-20 LAB — ECHOCARDIOGRAM COMPLETE
Height: 68.5 in
Weight: 5120 oz

## 2015-10-20 MED ORDER — FENOFIBRATE 160 MG PO TABS
160.0000 mg | ORAL_TABLET | Freq: Every evening | ORAL | Status: DC
  Administered 2015-10-20 – 2015-10-21 (×2): 160 mg via ORAL
  Filled 2015-10-20 (×2): qty 1

## 2015-10-20 NOTE — Progress Notes (Signed)
*  PRELIMINARY RESULTS* Echocardiogram 2D Echocardiogram has been performed.  Victor Wise 10/20/2015, 9:00 AM

## 2015-10-20 NOTE — Progress Notes (Signed)
[facility] NAME: Victor Wise    MR#:  [id number]  DATE OF BIRTH:  [birth year]  SUBJECTIVE:  CHIEF COMPLAINT:    REVIEW OF SYSTEMS:  CONSTITUTIONAL: No fever, fatigue or weakness.  EYES: No blurred or double vision.  EARS, NOSE, AND THROAT: No tinnitus or ear pain.  RESPIRATORY: No cough, shortness of breath, wheezing or hemoptysis.  CARDIOVASCULAR: No chest pain, orthopnea, edema.  GASTROINTESTINAL: No nausea, vomiting, diarrhea or abdominal pain.  GENITOURINARY: No dysuria, hematuria.  ENDOCRINE: No polyuria, nocturia,  HEMATOLOGY: No anemia, easy bruising or bleeding SKIN: No rash or lesion. MUSCULOSKELETAL: No joint pain or arthritis.   NEUROLOGIC: No tingling, numbness, weakness.  PSYCHIATRY: No anxiety or depression.   DRUG ALLERGIES:   Allergies  Allergen Reactions  . Amoxicillin Shortness Of Breath, Itching and Other (See Comments)    Has patient had a PCN reaction causing immediate rash, facial/tongue/throat swelling, SOB or lightheadedness with hypotension: Yes Has patient had a PCN reaction causing severe rash involving mucus membranes or skin necrosis: No Has patient had a PCN reaction that required hospitalization No Has patient had a PCN reaction occurring within the last 10 years: No If all of the above answers are "NO", then may proceed with Cephalosporin use.  . Other Anaphylaxis, Itching and Other (See Comments)    Pt states that he is allergic to Endopa.  Reaction:  Anaphylaxis  Pt states that he is allergic to Metabisulfites Reaction:  Itching   . Penicillins Shortness Of Breath, Itching and Other (See Comments)    Has patient had a PCN reaction causing immediate rash, facial/tongue/throat swelling, SOB or lightheadedness with hypotension: Yes Has patient had a PCN reaction causing severe rash involving mucus membranes or skin necrosis: No Has patient had a PCN reaction that required hospitalization  No Has patient had a PCN reaction occurring within the last 10 years: No If all of the above answers are "NO", then may proceed with Cephalosporin use.  [name] Hydralazine Other (See Comments)    Reaction:  Cramping of extremities  . Crestor [Rosuvastatin Calcium] Other (See Comments)    Reaction:  Pt is unable to move arms/legs   . Metoprolol Nausea And Vomiting  . Statins Other (See Comments)    Reaction:  Pt is unable to move arms/legs  . Sulfa Antibiotics Itching  . Yellow Dyes (Non-Tartrazine) Itching  . Aspirin Itching and Other (See Comments)    Pt states that he is able to use in lower doses.    . Tape Rash    VITALS:  Blood pressure 140/80, pulse 70, temperature 98.1 F (36.7 C), temperature source Oral, resp. rate 18, height 5' 8.5" (1.74 m), weight 145.151 kg (320 lb), SpO2 93 %.  PHYSICAL EXAMINATION:  GENERAL:  65 y.o.-year-old patient lying in the bed with no acute distress.  EYES: Pupils equal, round, reactive to light and accommodation. No scleral icterus. Extraocular muscles intact.  HEENT: Head atraumatic, normocephalic. Oropharynx and nasopharynx clear.  NECK:  Supple, no jugular venous distention. No thyroid enlargement, no tenderness.  LUNGS: Normal breath sounds bilaterally, no wheezing, rales,rhonchi or crepitation. No use of accessory muscles of respiration.  CARDIOVASCULAR: S1, S2 normal. No murmurs, rubs, or gallops.  ABDOMEN: Soft, nontender, nondistended. Bowel sounds present. No organomegaly or mass.  EXTREMITIES: No pedal edema, cyanosis, or clubbing.  NEUROLOGIC: Cranial nerves II through XII are intact. Muscle strength 5/5 in all extremities. Sensation intact. Gait not checked.  PSYCHIATRIC: The patient is alert and oriented x 3.  SKIN: No obvious rash, lesion, or ulcer.    LABORATORY PANEL:   CBC  Recent Labs Lab 10/20/15 0408  WBC 5.0  HGB 12.0*  HCT 36.9*  PLT 197    ------------------------------------------------------------------------------------------------------------------  Chemistries   Recent Labs Lab 10/20/15 0408  NA 141  K 3.5  CL 106  CO2 28  GLUCOSE 168*  BUN 24*  CREATININE 1.19  CALCIUM 8.8*   ------------------------------------------------------------------------------------------------------------------  Cardiac Enzymes  Recent Labs Lab 10/19/15 1555  TROPONINI <0.03   ------------------------------------------------------------------------------------------------------------------  RADIOLOGY:  Ct Head Wo Contrast  10/19/2015  CLINICAL DATA:  Weakness.  Fall. EXAM: CT HEAD WITHOUT CONTRAST TECHNIQUE: Contiguous axial images were obtained from the base of the skull through the vertex without intravenous contrast. COMPARISON:  12/18/2014 head CT. FINDINGS: There is a relatively well marginated low-attenuation focus in the left frontal corona radiata extending into the left putamen, new since 12/18/2014. No significant mass-effect. No evidence of parenchymal hemorrhage or extra-axial fluid collection. No midline shift. Intracranial atherosclerosis. Cerebral volume is age appropriate. No ventriculomegaly. The visualized paranasal sinuses are essentially clear. The mastoid air cells are unopacified. No evidence of calvarial fracture. IMPRESSION: Relatively well marginated low-attenuation focus in the left frontal corona radiata/left basal ganglia, new since 12/18/2014, most suggestive of a subacute infarct. No significant mass effect. No acute intracranial hemorrhage. These results were called by telephone at the time of interpretation on 10/19/2015 at 5:27 pm to [place], who verbally acknowledged these results. Electronically Signed   By: Ilona Sorrel M.D.   On: 10/19/2015 17:33   US Carotid Bilateral  10/20/2015  CLINICAL DATA:  CVA, slurred speech, weakness, hyperlipidemia. EXAM: BILATERAL CAROTID DUPLEX ULTRASOUND  TECHNIQUE: Pearline Cables scale imaging, color Doppler and duplex ultrasound were performed of bilateral carotid and vertebral arteries in the neck. COMPARISON:  None. FINDINGS: Criteria: Quantification of carotid stenosis is based on velocity parameters that correlate the residual internal carotid diameter with NASCET-based stenosis levels, using the diameter of the distal internal carotid lumen as the denominator for stenosis measurement. The following velocity measurements were obtained: RIGHT ICA:  48/15 cm/sec CCA:  [id number] cm/sec SYSTOLIC ICA/CCA RATIO:  0.5 DIASTOLIC ICA/CCA RATIO:  1.2 ECA:  67 cm/sec LEFT ICA:  48/15 cm/sec CCA:  0000000 cm/sec SYSTOLIC ICA/CCA RATIO:  0.6 DIASTOLIC ICA/CCA RATIO:  1.6 ECA:  60 cm/sec RIGHT CAROTID ARTERY: Minor echogenic shadowing plaque formation. No hemodynamically significant right ICA stenosis, velocity elevation, or turbulent flow. Degree of narrowing less than 50%. RIGHT VERTEBRAL ARTERY:  Antegrade LEFT CAROTID ARTERY: Similar scattered minor echogenic plaque formation. No hemodynamically significant left ICA stenosis, velocity elevation, or turbulent flow. LEFT VERTEBRAL ARTERY:  Antegrade IMPRESSION: Minor carotid atherosclerosis. No hemodynamically significant ICA stenosis. Degree of narrowing less than 50% bilaterally. Patent antegrade vertebral flow bilaterally. Electronically Signed   By: Jerilynn Mages.  Shick M.D.   On: 10/20/2015 10:25   Dg Hip Unilat  With Pelvis 2-3 Views Right  10/19/2015  CLINICAL DATA:  Fall 2 days ago from standing position with right hip pain, initial encounter EXAM: DG HIP (WITH OR WITHOUT PELVIS) 2-3V RIGHT COMPARISON:  None. FINDINGS: Mild degenerative changes in the lumbar spine are seen. The pelvic ring is intact. No acute fracture or dislocation is noted. A stimulating device is noted over the left iliac wing. IMPRESSION: No acute abnormality noted. Electronically Signed   By: Inez Catalina M.D.   On: 10/19/2015 17:38    EKG:  Orders placed or  performed during the hospital encounter of 10/19/15  . ED EKG  . ED EKG  . EKG 12-Lead  . EKG 12-Lead    ASSESSMENT AND PLAN:   Victor Wise is a 65 y.o. male with a known history of Obesity, prior strokes, ataxia, hypertension, insulin-dependent diabetes mellitus, CAD status post prior stents presents to hospital secondary to weakness, balance problems and also fall today.  # acute CVA- Presenting as balance problems and falls. Also prior history of ataxia and multiple falls in the past. No focal weakness noted. Cannot get an MRI done due to his spinal stimulator. -Admit, off unit telemetry. Neuro checks. -Continue aspirin and Plavix. Patient is allergic to statins. -lipid panel, triglycerides 600 LDL was not calculated because of the triglyceridemia. Increase dose of gemfibrozil. Patient is allergic to statin. Start Fish oil capsules . Physical therapy, occupational therapy is recommending home health PT and 24 hour supervision, recommending walker but patient is refusing Neurology is requesting outpatient Holter monitoring, outpatient follow-up with cardiology as recommended Echo with ejection fracture 50-55% Carotid  Dopplers with less than 50% stenosis Check hemoglobin A1c  #Acute depression Patient denies any suicidal ideation thoughts or plans. Denies any physical abuse Feeling down and in tears  refusing care, food and medications Psychiatric consult is placed and discussed with Dr. Weber Cooks, he will see the patient as soon as possible    # hypertension-On hydrochlorothiazide, benazepril, Norvasc.  # chronic low back pain- cont pain meds  # diabetes mellitus-Continue Lantus and added sliding scale insulin. Also on By[place] at home- not on formulary here.  # GERD- protonix  # CAD-Stable. Continue cardiac medications  # DVT Prophylaxis- lovenox    Physical therapy is recommending home health PT with 24-hour assistance  All the records are reviewed and case discussed  with Care Management/Social Workerr. Management plans discussed with the patient, family and they are in agreement.  CODE STATUS: FC   TOTAL TIME TAKING CARE OF THIS PATIENT: 42  minutes.   POSSIBLE D/C IN 2-3  DAYS, DEPENDING ON CLINICAL CONDITION.   Nicholes Mango M.D on 10/20/2015 at 3:28 PM  Between 7am to 6pm - Pager - [phone] After 6pm go to [email or web address] - password [email or web address] [facility]  Office  [phone]  CC: Primary care physician; Lavera Guise, MD

## 2015-10-20 NOTE — Care Management Note (Signed)
Case Management Note  Patient Details  Name: Victor Wise MRN: [id number] Date of Birth: 12-30-1950  Subjective/Objective:                  Met with patient and his sister to discuss discharge planning. PT pending. Patient has used [facility] in the past but he doesn't want it again. His sister said he is falling more and told patient while I was in there that "he needs to listen and take home health or find another place to live". Patient said he'd find another place to live. He states he has a front-wheeled walker. He was not using any DME to ambulate at home (thus increased falls?). He denies difficulty obtaining meds. His sister came to me again and said that she basically just "got cussed out by his daughter and she told his daughter that she could take care of him". Patient lives with sister and sister's husband. Sister said "he just don't want to do anything". His PCP is Dr. Clayborn Bigness. I explained to sister that patient has the right to make bad decisions.   Action/Plan: List of home health agencies left with patient- he is still refusing it.   Expected Discharge Date:                  Expected Discharge Plan:     In-House Referral:     Discharge planning Services  CM Consult  Post Acute Care Choice:  Home Health Choice offered to:  Patient  DME Arranged:    DME Agency:     HH Arranged:    [place] Agency:     Status of Service:  In process, will continue to follow  Medicare Important Message Given:  Yes Date Medicare IM Given:    Medicare IM give by:    Date Additional Medicare IM Given:    Additional Medicare Important Message give by:     If discussed at [place] of Stay Meetings, dates discussed:    Additional Comments:  Marshell Garfinkel, RN 10/20/2015, 12:04 PM

## 2015-10-20 NOTE — Progress Notes (Signed)
Per Dr. Margaretmary Eddy cancel discharge and place order for psych evaluation, patient is actively depressed, emotional and becoming noncompliant.  Refused physical therapy.

## 2015-10-20 NOTE — Evaluation (Signed)
Occupational Therapy Evaluation Patient Details Name: Victor Wise MRN: [id number] DOB: 01/27/1951 Today's Date: 10/20/2015    History of Present Illness Pt is a 65 y.o. male presenting to hospital s/p multiple falls (pt reporting "blacked" out) and with R hip pain.  R pelvis x-ray negative for fx.  PMH includes DM, htn, BPH, osteomyelitis of foot, morbid obesity, OSA, chronic back pain, depression, UTI, s/p insertion of spinal cord stimulator, heart attack, stroke , basal cell carcinoma (forehead), SIRS, R toe amp, cardiac cath, ataxia, h/o falls.   Clinical Impression   Prior to admission, patient lived with his sister, he is now refusing to return home with her and states he will go to his daughters house for a week and will then live in his truck once her husband returns home.  He reports being mad this date, he did not refuse OT but did refuse to perform select tasks such as sitting on the edge of the bed or performing toileting or dressing tasks. He denies any pain at this time.  He was previously independent with basic self care, could drive and perform light meal preparation.  Will need to further assess patients ability to perform daily tasks to determine what needs he has. Unsure if patient's judgment is impaired given his statements regarding living in his vehicle or if he is speaking out of anger at his situation.  Given his recent falls and medical status, he would likely benefit from skilled OT to maximize his safety and independence in daily tasks.      Follow Up Recommendations  Home health OT (depending on progress although he doesn't feel he needs any services when discharged. )    Equipment Recommendations       Recommendations for Other Services       Precautions / Restrictions Precautions Precautions: Fall Restrictions Weight Bearing Restrictions: No      Mobility Bed Mobility Overal bed mobility: Needs Assistance Bed Mobility: Supine to Sit;Sit to Supine      Supine to sit: Supervision;HOB elevated Sit to supine: Modified independent (Device/Increase time);HOB elevated   General bed mobility comments: Patient declined this date.  Transfers Overall transfer level: Needs assistance Equipment used:  (Bariatric RW) Transfers: Sit to/from Stand Sit to Stand: Min guard         General transfer comment: Patient declined this date to get up.     Balance Overall balance assessment: Needs assistance;History of Falls Sitting-balance support: No upper extremity supported;Feet supported Sitting balance-Leahy Scale: Good     Standing balance support: Bilateral upper extremity supported (on RW) Standing balance-Leahy Scale: Good                              ADL Overall ADL's : Needs assistance/impaired                                       General ADL Comments: Patient refusing to get up and demonstrate the ability to perform self care tasks, he was able to show how to cross his legs in bed to be able to reach his socks, otherwise refuses to perform toileting or activities seated at the edge of the bed.  States, "I may do it tomorrow but not today, I am irritated today."     Vision Additional Comments: Patient reports his vision improved since his stroke and  can now see the TV better.   Perception     Praxis      Pertinent Vitals/Pain Pain Assessment: No/denies pain Pain Score: 0-No pain Faces Pain Scale: No hurt     Hand Dominance Right   Extremity/Trunk Assessment Upper Extremity Assessment Upper Extremity Assessment: Generalized weakness   Lower Extremity Assessment Lower Extremity Assessment: Generalized weakness   Cervical / Trunk Assessment Cervical / Trunk Assessment: Kyphotic   Communication Communication Communication: No difficulties   Cognition Arousal/Alertness: Awake/alert Behavior During Therapy: Flat affect;Agitated Overall Cognitive Status: Within Functional Limits for tasks  assessed (unsure of patient's judgement, may need further assessment, he reports he may go to his daughters for a week but then thinks he may live in his truck.)                     General Comments       Exercises       Shoulder Instructions      Home Living Family/patient expects to be discharged to:: Private residence Living Arrangements: Other relatives Available Help at Discharge: Family   Home Access: Stairs to enter Entrance Stairs-Number of Steps: 2 vs 4 steps to enter Entrance Stairs-Rails: Can reach both Home Layout: One level     Bathroom Shower/Tub: Teacher, early years/pre: [place]: Environmental consultant - 2 wheels;Cane - single point;Cane - quad          Prior Functioning/Environment Level of Independence: Independent with assistive device(s)        Comments: Pt using cane prior to admission.  Pt reports multiple falls recently.    OT Diagnosis: Generalized weakness   OT Problem List: Decreased strength;Decreased safety awareness   OT Treatment/Interventions: Self-care/ADL training;Therapeutic exercise;Patient/family education;Cognitive remediation/compensation;DME and/or AE instruction    OT Goals(Current goals can be found in the care plan section) Acute Rehab OT Goals Patient Stated Goal: "I want to walk again and go on with life." OT Goal Formulation: With patient Time For Goal Achievement: 11/03/15 Potential to Achieve Goals: Good  OT Frequency: Min 1X/week   Barriers to D/C:    Patient reports he is refusing to return home with his sister, wants to go to his daughters house which is local but reports he will only be able to stay the week until her husband gets home, his plan after that is to "live" in his truck.        Co-evaluation              End of Session    Activity Tolerance: Treatment limited secondary to agitation Patient left: in bed;with call bell/phone within reach;with bed alarm set   Time:  [id number] OT Time Calculation (min): 18 min Charges:  OT General Charges $OT Visit: 1 Procedure OT Evaluation $OT Eval Low Complexity: 1 Procedure G-Codes:    Victor Wise, OTR/L, CLT  Victor Wise 10/20/2015, 3:38 PM

## 2015-10-20 NOTE — Progress Notes (Signed)
Patient is A&O x4, flat affect this afternoon. Neuro checks negative. Up the assist x1. Up at bedside with urinal. Blood sugars 190, 259, 210, received SS insulin coverage. Bed alarm on for safety. NSL in [place], fluids running. Tele in place. Tolerating diet. CVA booklet reviewed with patient.

## 2015-10-20 NOTE — Consult Note (Signed)
[facility] Consult   Reason for Consult:  Consult for 65 year old man with a history of depressed mood and multiple physical problems Referring Physician:  Gouru Patient Identification: Victor Wise MRN:  [id number] Principal Diagnosis: Adjustment disorder with mixed anxiety and depressed mood Diagnosis:   Patient Active Problem List   Diagnosis Date Noted  . Adjustment disorder with mixed anxiety and depressed mood [F43.23] 10/20/2015  . Dysthymia [F34.1] 10/20/2015  . CVA (cerebral infarction) [I63.9] 10/19/2015  . Urinary retention [R33.9] 03/25/2015  . 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 Acute MI, anterolateral wall, subsequent episode of care ([place]) [I21.09] 12/19/2014  . SIRS (systemic inflammatory response syndrome) (HCC) [R65.10] 12/18/2014    Total Time spent with patient: 45 minutes  Subjective:   KEAVON SENSING is a 65 y.o. male patient admitted with "I'm fine".  HPI:  Patient interviewed chart reviewed. Mild Notes reviewed. This 65 year old man is currently in the hospital with a new transient stroke. Medical treatment team noticed the patient to be tearful and upset earlier and requested psychiatry consult. When I came in to see the patient he immediately recognized me and started saying no over and over again. Nevertheless when I started interviewing him he did not insist that I leave. Patient told me that he was fine and had no complaints. After asking him several times he admitted that his mood is bad. He says he is always depressed and of course he is depressed now because he is always depressed. Energy level is poor. Sleep is poor. He feels hopeless about the future with no optimism. He is currently taking at least 2 different antidepressant medicines possibly 3 and has been following up with his primary care doctor. Sleep is poor. Energy level is poor. Sounds like he doesn't do a very good job taking care of his overall health. The thing he is most  frustrated about his essentially the same thing that he was upset about last time he was in the hospital but there is a disagreement between him and his sister about his living situation. Patient would like to simply go back to his living situation at home with his sister but she is insisting that he go to some kind of assisted living facility because she has trouble taking care of him at home. This makes him angry and frustrated. Patient denies any suicidal intent or plan or wish. Denies psychotic symptoms. Denies that he's been drinking.  Social history: Not married. Pretty dependent. It sounds like he has invested his life in a long-term complaint about a Workmen's Comp. issue. He says he's been involved in this legal case for 25 years and is still waiting for a resolution. Lives with his sister.  Medical history: Overweight. High blood pressure. Elevated cholesterol. History of multiple strokes.  Substance abuse history: Denies that he's been drinking or using any drugs minimizes any past problems.  Past Psychiatric History: Patient denies any history of suicide attempts. He has not been hospitalized before. He has been evaluated multiple times and is always complaining of depression. When I saw him a year or so ago we added another antidepressant. He claims that he's been compliant with the bupropion as well as the Cymbalta and also says he takes Prozac. Doesn't think any of it has ever helped him at all. Sounds like he keeps a pretty negative attitude.  Risk to Self: Is patient at risk for suicide?: No Risk to Others:   Prior Inpatient Therapy:   Prior  Outpatient Therapy:    Past Medical History:  Past Medical History  Diagnosis Date  . Diabetes ([place])   . Hypertension   . BPH (benign prostatic hyperplasia)   . Osteomyelitis of foot ([name])   . Morbid obesity ([place])   . Obstructive sleep apnea   . Chronic back pain   . Depression   . UTI (lower urinary tract infection)   . Status post  insertion of spinal cord stimulator   . Heart attack ([place])   . Stroke ([place])   . Basal cell carcinoma     forehead  . GERD (gastroesophageal reflux disease)   . Allergy     Past Surgical History  Procedure Laterality Date  . Pain stimulator    . Right toe amputation    . Cardiac catheterization N/A 12/19/2014    Procedure: Coronary Stent Intervention;  Surgeon: Charolette Forward, MD;  Location: [facility];  Service: Cardiovascular;  Laterality: N/A;  . Cardiac catheterization Left 12/19/2014    Procedure: Left Heart Cath and Coronary Angiography;  Surgeon: Dionisio Jailin, MD;  Location: [facility];  Service: Cardiovascular;  Laterality: Left;   Family History:  Family History  Problem Relation Age of Onset  . Breast cancer Mother   . Cancer Mother   . Hypertension Mother   . Lung cancer Father   . Hypertension Father   . Heart disease Father   . Cancer Father   . Kidney disease Sister   . Prostate cancer Neg Hx    Family Psychiatric  History: Patient denies any family history of mental illness Social History:  History  Alcohol Use  . 0.0 oz/week  . 0 Standard drinks or equivalent per week    Comment: occasionally     History  Drug Use No    Social History   Social History  . Marital Status: Divorced    Spouse Name: N/A  . Number of Children: N/A  . Years of Education: N/A   Social History Main Topics  . Smoking status: Former Smoker    Types: Cigarettes  . Smokeless tobacco: None     Comment: quit 45 years  . Alcohol Use: 0.0 oz/week    0 Standard drinks or equivalent per week     Comment: occasionally  . Drug Use: No  . Sexual Activity: Not Asked   Other Topics Concern  . None   Social History Narrative   Additional Social History:    Allergies:   Allergies  Allergen Reactions  . Amoxicillin Shortness Of Breath, Itching and Other (See Comments)    Has patient had a PCN reaction causing immediate rash, facial/tongue/throat swelling, SOB  or lightheadedness with hypotension: Yes Has patient had a PCN reaction causing severe rash involving mucus membranes or skin necrosis: No Has patient had a PCN reaction that required hospitalization No Has patient had a PCN reaction occurring within the last 10 years: No If all of the above answers are "NO", then may proceed with Cephalosporin use.  . Other Anaphylaxis, Itching and Other (See Comments)    Pt states that he is allergic to Endopa.  Reaction:  Anaphylaxis  Pt states that he is allergic to Metabisulfites Reaction:  Itching   . Penicillins Shortness Of Breath, Itching and Other (See Comments)    Has patient had a PCN reaction causing immediate rash, facial/tongue/throat swelling, SOB or lightheadedness with hypotension: Yes Has patient had a PCN reaction causing severe rash involving mucus membranes or skin  necrosis: No Has patient had a PCN reaction that required hospitalization No Has patient had a PCN reaction occurring within the last 10 years: No If all of the above answers are "NO", then may proceed with Cephalosporin use.  [name] Hydralazine Other (See Comments)    Reaction:  Cramping of extremities  . Crestor [Rosuvastatin Calcium] Other (See Comments)    Reaction:  Pt is unable to move arms/legs   . Metoprolol Nausea And Vomiting  . Statins Other (See Comments)    Reaction:  Pt is unable to move arms/legs  . Sulfa Antibiotics Itching  . Yellow Dyes (Non-Tartrazine) Itching  . Aspirin Itching and Other (See Comments)    Pt states that he is able to use in lower doses.    . Tape Rash    Labs:  Results for orders placed or performed during the hospital encounter of 10/19/15 (from the past 48 hour(s))  Basic metabolic panel     Status: Abnormal   Collection Time: 10/19/15  3:55 PM  Result Value Ref Range   Sodium 140 135 - 145 mmol/L   Potassium 3.9 3.5 - 5.1 mmol/L   Chloride 104 101 - 111 mmol/L   CO2 24 22 - 32 mmol/L   Glucose, Bld 190 (H) 65 - 99 mg/dL   BUN 23  (H) 6 - 20 mg/dL   Creatinine, Ser 1.46 (H) 0.61 - 1.24 mg/dL   Calcium 9.3 8.9 - 10.3 mg/dL   GFR calc non Af Amer 49 (L) >60 mL/min   GFR calc Af Amer 57 (L) >60 mL/min    Comment: (NOTE) The eGFR has been calculated using the CKD EPI equation. This calculation has not been validated in all clinical situations. eGFR's persistently <60 mL/min signify possible Chronic Kidney Disease.    Anion gap 12 5 - 15  CBC     Status: Abnormal   Collection Time: 10/19/15  3:55 PM  Result Value Ref Range   WBC 5.5 3.8 - 10.6 K/uL   RBC 5.10 4.40 - 5.90 MIL/uL   Hemoglobin 12.8 (L) 13.0 - 18.0 g/dL   HCT 39.5 (L) 40.0 - 52.0 %   MCV 77.4 (L) 80.0 - 100.0 fL   MCH 25.2 (L) 26.0 - 34.0 pg   MCHC 32.5 32.0 - 36.0 g/dL   RDW 18.4 (H) 11.5 - 14.5 %   Platelets 241 150 - 440 K/uL  Troponin I     Status: None   Collection Time: 10/19/15  3:55 PM  Result Value Ref Range   Troponin I <0.03 <0.031 ng/mL    Comment:        NO INDICATION OF MYOCARDIAL INJURY.   Lipid panel     Status: Abnormal   Collection Time: 10/19/15  3:55 PM  Result Value Ref Range   Cholesterol 223 (H) 0 - 200 mg/dL   Triglycerides 600 (H) <150 mg/dL   HDL 32 (L) >40 mg/dL   Total CHOL/HDL Ratio 7.0 RATIO   VLDL UNABLE TO CALCULATE IF TRIGLYCERIDE OVER 400 mg/dL 0 - 40 mg/dL   LDL Cholesterol UNABLE TO CALCULATE IF TRIGLYCERIDE OVER 400 mg/dL 0 - 99 mg/dL    Comment:        Total Cholesterol/HDL:CHD Risk Coronary Heart Disease Risk Table                     Men   Women  1/2 Average Risk   3.4   3.3  Average Risk  5.0   4.4  2 X Average Risk   9.6   7.1  3 X Average Risk  23.4   11.0        Use the calculated Patient Ratio above and the CHD Risk Table to determine the patient's CHD Risk.        ATP III CLASSIFICATION (LDL):  <100     mg/dL   Optimal  100-129  mg/dL   Near or Above                    Optimal  130-159  mg/dL   Borderline  160-189  mg/dL   High  >190     mg/dL   Very High   Lactic acid,  plasma     Status: Abnormal   Collection Time: 10/19/15  4:20 PM  Result Value Ref Range   Lactic Acid, Venous 2.1 (HH) 0.5 - 2.0 mmol/L    Comment: CRITICAL RESULT CALLED TO, READ BACK BY AND VERIFIED WITH SHANNON HATCH ON 10/19/15 AT 1714 BY [facility]   Lactic acid, plasma     Status: None   Collection Time: 10/19/15  8:41 PM  Result Value Ref Range   Lactic Acid, Venous 1.0 0.5 - 2.0 mmol/L  Glucose, capillary     Status: Abnormal   Collection Time: 10/19/15  9:39 PM  Result Value Ref Range   Glucose-Capillary 236 (H) 65 - 99 mg/dL  Urinalysis complete, with microscopic     Status: Abnormal   Collection Time: 10/19/15 11:12 PM  Result Value Ref Range   Color, Urine YELLOW (A) YELLOW   APPearance CLEAR (A) CLEAR   Glucose, UA 50 (A) NEGATIVE mg/dL   Bilirubin Urine NEGATIVE NEGATIVE   Ketones, ur NEGATIVE NEGATIVE mg/dL   Specific Gravity, Urine 1.021 1.005 - 1.030   Hgb urine dipstick NEGATIVE NEGATIVE   pH 6.0 5.0 - 8.0   Protein, ur NEGATIVE NEGATIVE mg/dL   Nitrite NEGATIVE NEGATIVE   Leukocytes, UA NEGATIVE NEGATIVE   RBC / HPF NONE SEEN 0 - 5 RBC/hpf   WBC, UA 0-5 0 - 5 WBC/hpf   Bacteria, UA NONE SEEN NONE SEEN   Squamous Epithelial / LPF 0-5 (A) NONE SEEN  Basic metabolic panel     Status: Abnormal   Collection Time: 10/20/15  4:08 AM  Result Value Ref Range   Sodium 141 135 - 145 mmol/L   Potassium 3.5 3.5 - 5.1 mmol/L   Chloride 106 101 - 111 mmol/L   CO2 28 22 - 32 mmol/L   Glucose, Bld 168 (H) 65 - 99 mg/dL   BUN 24 (H) 6 - 20 mg/dL   Creatinine, Ser 1.19 0.61 - 1.24 mg/dL   Calcium 8.8 (L) 8.9 - 10.3 mg/dL   GFR calc non Af Amer >60 >60 mL/min   GFR calc Af Amer >60 >60 mL/min    Comment: (NOTE) The eGFR has been calculated using the CKD EPI equation. This calculation has not been validated in all clinical situations. eGFR's persistently <60 mL/min signify possible Chronic Kidney Disease.    Anion gap 7 5 - 15  CBC     Status: Abnormal   Collection Time:  10/20/15  4:08 AM  Result Value Ref Range   WBC 5.0 3.8 - 10.6 K/uL   RBC 4.66 4.40 - 5.90 MIL/uL   Hemoglobin 12.0 (L) 13.0 - 18.0 g/dL   HCT 36.9 (L) 40.0 - 52.0 %   MCV 79.2 (L) 80.0 -  100.0 fL   MCH 25.7 (L) 26.0 - 34.0 pg   MCHC 32.5 32.0 - 36.0 g/dL   RDW 18.5 (H) 11.5 - 14.5 %   Platelets 197 150 - 440 K/uL  Glucose, capillary     Status: Abnormal   Collection Time: 10/20/15  8:26 AM  Result Value Ref Range   Glucose-Capillary 190 (H) 65 - 99 mg/dL   Comment 1 Notify RN   Glucose, capillary     Status: Abnormal   Collection Time: 10/20/15 11:43 AM  Result Value Ref Range   Glucose-Capillary 259 (H) 65 - 99 mg/dL   Comment 1 Notify RN   Glucose, capillary     Status: Abnormal   Collection Time: 10/20/15  4:30 PM  Result Value Ref Range   Glucose-Capillary 210 (H) 65 - 99 mg/dL   Comment 1 Notify RN     Current Facility-Administered Medications  Medication Dose Route Frequency Provider Last Rate Last Dose  . 0.9 %  sodium chloride infusion   Intravenous Continuous Gladstone Lighter, MD 60 mL/hr at 10/20/15 1731    . acetaminophen (TYLENOL) tablet 650 mg  650 mg Oral Q6H PRN Gladstone Lighter, MD       Or  . acetaminophen (TYLENOL) suppository 650 mg  650 mg Rectal Q6H PRN Gladstone Lighter, MD      . amLODipine (NORVASC) tablet 10 mg  10 mg Oral Daily Gladstone Lighter, MD   10 mg at 10/20/15 1038  . aspirin EC tablet 81 mg  81 mg Oral Daily Gladstone Lighter, MD   81 mg at 10/20/15 1038  . benazepril (LOTENSIN) tablet 40 mg  40 mg Oral BID Gladstone Lighter, MD   40 mg at 10/20/15 1038  . buPROPion (WELLBUTRIN XL) 24 hr tablet 300 mg  300 mg Oral Daily Gladstone Lighter, MD   300 mg at 10/20/15 1038  . busPIRone (BUSPAR) tablet 10 mg  10 mg Oral QHS Gladstone Lighter, MD   10 mg at 10/19/15 2253  . clopidogrel (PLAVIX) tablet 75 mg  75 mg Oral Daily Gladstone Lighter, MD   75 mg at 10/20/15 1037  . DULoxetine (CYMBALTA) DR capsule 30 mg  30 mg Oral BID Gladstone Lighter,  MD   30 mg at 10/20/15 1037  . enoxaparin (LOVENOX) injection 40 mg  40 mg Subcutaneous Q12H Gladstone Lighter, MD   40 mg at 10/20/15 1039  . fenofibrate tablet 160 mg  160 mg Oral QPM Nicholes Mango, MD   160 mg at 10/20/15 1730  . fluticasone (FLONASE) 50 MCG/ACT nasal spray 2 spray  2 spray Each Nare Daily Gladstone Lighter, MD   2 spray at 10/20/15 1040  . hydrochlorothiazide (MICROZIDE) capsule 12.5 mg  12.5 mg Oral Daily Gladstone Lighter, MD   12.5 mg at 10/20/15 1038  . insulin aspart (novoLOG) injection 0-5 Units  0-5 Units Subcutaneous QHS Gladstone Lighter, MD   2 Units at 10/19/15 2255  . insulin aspart (novoLOG) injection 0-9 Units  0-9 Units Subcutaneous TID WC Gladstone Lighter, MD   3 Units at 10/20/15 1730  . insulin glargine (LANTUS) injection 60 Units  60 Units Subcutaneous QHS Gladstone Lighter, MD   60 Units at 10/19/15 2255  . metaxalone (SKELAXIN) tablet 400 mg  400 mg Oral TID PRN Gladstone Lighter, MD      . ondansetron (ZOFRAN) tablet 4 mg  4 mg Oral Q6H PRN Gladstone Lighter, MD       Or  . ondansetron (ZOFRAN) injection 4 mg  4 mg Intravenous Q6H PRN Gladstone Lighter, MD      . Oxcarbazepine (TRILEPTAL) tablet 300 mg  300 mg Oral q morning - 10a Gladstone Lighter, MD   300 mg at 10/20/15 1037  . Oxcarbazepine (TRILEPTAL) tablet 600 mg  600 mg Oral QPM Gladstone Lighter, MD   600 mg at 10/20/15 1730  . oxyCODONE (Oxy IR/ROXICODONE) immediate release tablet 5 mg  5 mg Oral TID PRN Gladstone Lighter, MD      . oxyCODONE (OXYCONTIN) 12 hr tablet 20 mg  20 mg Oral Q12H Gladstone Lighter, MD   20 mg at 10/20/15 1037  . pantoprazole (PROTONIX) EC tablet 40 mg  40 mg Oral Daily Gladstone Lighter, MD   40 mg at 10/20/15 1037  . pregabalin (LYRICA) capsule 200 mg  200 mg Oral QPM Gladstone Lighter, MD   200 mg at 10/20/15 1730  . pregabalin (LYRICA) capsule 400 mg  400 mg Oral q morning - 10a Gladstone Lighter, MD   400 mg at 10/20/15 1053  . sodium chloride flush (NS) 0.9 %  injection 3 mL  3 mL Intravenous Q12H Gladstone Lighter, MD   3 mL at 10/20/15 1247  . tamsulosin (FLOMAX) capsule 0.4 mg  0.4 mg Oral QPC supper Gladstone Lighter, MD   0.4 mg at 10/20/15 1730    Musculoskeletal: Strength & Muscle Tone: decreased Gait & Station: unsteady Patient leans: Right  Psychiatric Specialty Exam: Physical Exam  Nursing note and vitals reviewed. Constitutional: He appears well-developed and well-nourished.  HENT:  Head: Normocephalic and atraumatic.  Eyes: Conjunctivae are normal. Pupils are equal, round, and reactive to light.  Neck: Normal range of motion.  Cardiovascular: Regular rhythm and normal heart sounds.   Respiratory: Effort normal. No respiratory distress.  GI: Soft.  Musculoskeletal: Normal range of motion.  Neurological: He is alert.  Skin: Skin is warm and dry.  Psychiatric: Thought content normal. His speech is delayed. He is slowed. Cognition and memory are normal. He expresses impulsivity. He exhibits a depressed mood.    Review of Systems  Constitutional: Positive for malaise/fatigue.  HENT: Negative.   Eyes: Negative.   Respiratory: Negative.   Cardiovascular: Negative.   Gastrointestinal: Negative.   Musculoskeletal: Negative.   Skin: Negative.   Neurological: Negative.   Psychiatric/Behavioral: Positive for depression. Negative for suicidal ideas, hallucinations, memory loss and substance abuse. The patient is not nervous/anxious and does not have insomnia.     Blood pressure 150/43, pulse 54, temperature 97.7 F (36.5 C), temperature source Oral, resp. rate 18, height 5' 8.5" (1.74 m), weight 145.151 kg (320 lb), SpO2 97 %.Body mass index is 47.94 kg/(m^2).  General Appearance: Disheveled  Eye Contact:  None  Speech:  Slow  Volume:  Decreased  Mood:  Dysphoric  Affect:  Depressed  Thought Process:  Coherent  Orientation:  Full (Time, Place, and Person)  Thought Content:  Logical  Suicidal Thoughts:  No  Homicidal  Thoughts:  No  Memory:  Immediate;   Good Recent;   Fair Remote;   Fair  Judgement:  Impaired  Insight:  Shallow  Psychomotor Activity:  Decreased  Concentration:  Concentration: Poor  Recall:  [employer] of Knowledge:  Fair  Language:  Fair  Akathisia:  No  Handed:  Right  AIMS (if indicated):     Assets:  Desire for Improvement Social Support  ADL's:  Intact  Cognition:  WNL  Sleep:        Treatment Plan Summary: Plan 65 year old  man with chronic depression. Sounds like he's been like this most of his life. Doesn't engage in outpatient psychotherapy. He takes his medicine supposedly but doesn't seem to get any benefit from them. He has no interest at all in engaging in any new treatment. It sounds like there is probably a significant problem with personality and behavior and dependency. No need for psychiatric hospitalization. Made some attempt to engage him in conversation therapeutically which she tends to shut down right away. I will not change anything about his medicine. I will check back later if he stays in the hospital but no other need to change treatment for now.  Disposition: Patient does not meet criteria for psychiatric inpatient admission. Supportive therapy provided about ongoing stressors.  Alethia Berthold, MD 10/20/2015 6:17 PM

## 2015-10-20 NOTE — Progress Notes (Signed)
PT Cancellation Note  Patient Details Name: Victor Wise MRN: [id number] DOB: Aug 28, 1950   Cancelled Treatment:    Reason Eval/Treat Not Completed: Patient at procedure or test/unavailable.  Pt currently off floor for testing.  Will re-attempt PT eval at a later date/time.   [name] Johari Bennetts 10/20/2015, 10:22 AM Leitha Bleak, [place]

## 2015-10-20 NOTE — Consult Note (Signed)
Referring Physician: Gouru    Chief Complaint: Falls, slurred speech  HPI: Victor Wise is an 65 y.o. male with a known history of obesity, prior strokes, ataxia, hypertension, insulin-dependent diabetes mellitus, CAD status post prior stents who is a very poor historian.  Per patient he has been falling more since Thursday of last week.  At that time feels that he developed slurred speech as well.  Has continued to fall since Thursday but his speech has improved and he has returned to baseline with speech.  Has not walked today and is not clear if his gait has improved.  Per chart sister felt speech was slurred yesterday after a fall.  Patient with walker and cane at home.  Has only been using the cane.  Initial NIHSS of 1.    Date last known well: 10/15/2015 Time last known well: Unable to determine tPA Given: No: Unable to determine LKW  Past Medical History  Diagnosis Date  . Diabetes ([place])   . Hypertension   . BPH (benign prostatic hyperplasia)   . 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 Morbid obesity ([place])   . Obstructive sleep apnea   . Chronic back pain   . Depression   . UTI (lower urinary tract infection)   . Status post insertion of spinal cord stimulator   . Heart attack ([place])   . Stroke ([place])   . Basal cell carcinoma     forehead  . GERD (gastroesophageal reflux disease)   . Allergy     Past Surgical History  Procedure Laterality Date  . Pain stimulator    . Right toe amputation    . Cardiac catheterization N/A 12/19/2014    Procedure: Coronary Stent Intervention;  Surgeon: Charolette Forward, MD;  Location: [facility];  Service: Cardiovascular;  Laterality: N/A;  . Cardiac catheterization Left 12/19/2014    Procedure: Left Heart Cath and Coronary Angiography;  Surgeon: Dionisio Wilton, MD;  Location: [facility];  Service: Cardiovascular;  Laterality: Left;    Family History  Problem Relation Age of Onset  . Breast cancer Mother   . Cancer Mother   .  Hypertension Mother   . Lung cancer Father   . Hypertension Father   . Heart disease Father   . Cancer Father   . Kidney disease Sister   . Prostate cancer Neg Hx    Social History:  reports that he has quit smoking. His smoking use included Cigarettes. He does not have any smokeless tobacco history on file. He reports that he drinks alcohol. He reports that he does not use illicit drugs.  Allergies:  Allergies  Allergen Reactions  . Amoxicillin Shortness Of Breath, Itching and Other (See Comments)    Has patient had a PCN reaction causing immediate rash, facial/tongue/throat swelling, SOB or lightheadedness with hypotension: Yes Has patient had a PCN reaction causing severe rash involving mucus membranes or skin necrosis: No Has patient had a PCN reaction that required hospitalization No Has patient had a PCN reaction occurring within the last 10 years: No If all of the above answers are "NO", then may proceed with Cephalosporin use.  . Other Anaphylaxis, Itching and Other (See Comments)    Pt states that he is allergic to Endopa.  Reaction:  Anaphylaxis  Pt states that he is allergic to Metabisulfites Reaction:  Itching   . Penicillins Shortness Of Breath, Itching and Other (See Comments)    Has patient had a PCN reaction causing  immediate rash, facial/tongue/throat swelling, SOB or lightheadedness with hypotension: Yes Has patient had a PCN reaction causing severe rash involving mucus membranes or skin necrosis: No Has patient had a PCN reaction that required hospitalization No Has patient had a PCN reaction occurring within the last 10 years: No If all of the above answers are "NO", then may proceed with Cephalosporin use.  [name] Hydralazine Other (See Comments)    Reaction:  Cramping of extremities  . Crestor [Rosuvastatin Calcium] Other (See Comments)    Reaction:  Pt is unable to move arms/legs   . Metoprolol Nausea And Vomiting  . Statins Other (See Comments)    Reaction:  Pt is  unable to move arms/legs  . Sulfa Antibiotics Itching  . Yellow Dyes (Non-Tartrazine) Itching  . Aspirin Itching and Other (See Comments)    Pt states that he is able to use in lower doses.    . Tape Rash    Medications:  I have reviewed the patient's current medications. Prior to Admission:  Prescriptions prior to admission  Medication Sig Dispense Refill Last Dose  . amLODipine (NORVASC) 10 MG tablet Take 10 mg by mouth daily.   10/19/2015 at Unknown time  . aspirin EC 81 MG tablet Take 81 mg by mouth daily.   10/19/2015 at [place]  . benazepril (LOTENSIN) 40 MG tablet Take 40 mg by mouth 2 (two) times daily.    10/19/2015 at Unknown time  . buPROPion (WELLBUTRIN XL) 150 MG 24 hr tablet Take 300 mg by mouth daily.    10/19/2015 at Unknown time  . busPIRone (BUSPAR) 10 MG tablet Take 10 mg by mouth at bedtime.   10/18/2015 at Unknown time  . cetirizine (ZYRTEC) 10 MG tablet Take 10 mg by mouth at bedtime as needed for allergies.    Past Week at Unknown time  . clopidogrel (PLAVIX) 75 MG tablet Take 75 mg by mouth daily.   10/19/2015 at [place]  . DULoxetine (CYMBALTA) 30 MG capsule Take 30-60 mg by mouth 2 (two) times daily. Pt takes two capsules in the morning and one at night.   10/19/2015 at Unknown time  . Exenatide ER (BYDUREON) 2 MG PEN Inject 2 mg into the skin once a week. Pt uses on Sunday.   10/11/2015 at unknown   . fenofibrate 54 MG tablet Take 54 mg by mouth every evening.   10/18/2015 at Unknown time  . fluticasone (FLONASE) 50 MCG/ACT nasal spray Place 2 sprays into both nostrils daily.   10/19/2015 at Unknown time  . hydrochlorothiazide (MICROZIDE) 12.5 MG capsule Take 12.5 mg by mouth daily.   10/19/2015 at Unknown time  . insulin glargine (LANTUS) 100 UNIT/ML injection Inject 60 Units into the skin at bedtime.   10/18/2015 at Unknown time  . insulin regular (NOVOLIN R,HUMULIN R) 100 units/mL injection Inject 0-15 Units into the skin 3 (three) times daily with meals as needed for high blood sugar. Pt  uses as needed per sliding scale.   10/19/2015 at Unknown time  . metaxalone (SKELAXIN) 800 MG tablet Take 400-800 mg by mouth 3 (three) times daily as needed for muscle spasms.   Past Month at Unknown time  . NARCAN 4 MG/0.1ML LIQD Take 4 mg by mouth as needed (for opioid overdose).    Past Month at Unknown time  . nitroGLYCERIN (NITROSTAT) 0.4 MG SL tablet Place 1 tablet (0.4 mg total) under the tongue every 5 (five) minutes x 3 doses as needed for chest pain. 25 tablet  1 Past Month at Unknown time  . Oxcarbazepine (TRILEPTAL) 300 MG tablet Take 300-600 mg by mouth 2 (two) times daily. Pt takes one tablet in the morning and two at night.   10/19/2015 at Unknown time  . oxyCODONE (OXY IR/ROXICODONE) 5 MG immediate release tablet Take 5 mg by mouth 3 (three) times daily as needed for severe pain.    10/18/2015 at [place]  . OxyCODONE (OXYCONTIN) 20 mg T12A 12 hr tablet Take 20 mg by mouth every 12 (twelve) hours.    10/19/2015 at [place]  . pantoprazole (PROTONIX) 40 MG tablet Take 40 mg by mouth daily.   10/19/2015 at Unknown time  . pregabalin (LYRICA) 200 MG capsule Take 200-400 mg by mouth 2 (two) times daily. Pt takes two capsules in the morning and one at night.   10/19/2015 at Unknown time  . tamsulosin (FLOMAX) 0.4 MG CAPS capsule Take 0.4 mg by mouth daily after supper.    10/18/2015 at Unknown time   Scheduled: . amLODipine  10 mg Oral Daily  . aspirin EC  81 mg Oral Daily  . benazepril  40 mg Oral BID  . buPROPion  300 mg Oral Daily  . busPIRone  10 mg Oral QHS  . clopidogrel  75 mg Oral Daily  . DULoxetine  30 mg Oral BID  . enoxaparin (LOVENOX) injection  40 mg Subcutaneous Q12H  . fenofibrate  54 mg Oral QPM  . fluticasone  2 spray Each Nare Daily  . hydrochlorothiazide  12.5 mg Oral Daily  . insulin aspart  0-5 Units Subcutaneous QHS  . insulin aspart  0-9 Units Subcutaneous TID WC  . insulin glargine  60 Units Subcutaneous QHS  . Oxcarbazepine  300 mg Oral q morning - 10a  . OXcarbazepine  600  mg Oral QPM  . oxyCODONE  20 mg Oral Q12H  . pantoprazole  40 mg Oral Daily  . pregabalin  200 mg Oral QPM  . pregabalin  400 mg Oral q morning - 10a  . sodium chloride flush  3 mL Intravenous Q12H  . tamsulosin  0.4 mg Oral QPC supper    ROS: History obtained from the patient  General ROS: negative for - chills, fatigue, fever, night sweats, weight gain or weight loss Psychological ROS: negative for - behavioral disorder, hallucinations, memory difficulties, mood swings or suicidal ideation Ophthalmic ROS: negative for - blurry vision, double vision, eye pain or loss of vision ENT ROS: negative for - epistaxis, nasal discharge, oral lesions, sore throat, tinnitus or vertigo Allergy and Immunology ROS: negative for - hives or itchy/watery eyes Hematological and Lymphatic ROS: negative for - bleeding problems, bruising or swollen lymph nodes Endocrine ROS: negative for - galactorrhea, hair pattern changes, polydipsia/polyuria or temperature intolerance Respiratory ROS: negative for - cough, hemoptysis, shortness of breath or wheezing Cardiovascular ROS: negative for - chest pain, dyspnea on exertion, edema or irregular heartbeat Gastrointestinal ROS: negative for - abdominal pain, diarrhea, hematemesis, nausea/vomiting or stool incontinence Genito-Urinary ROS: negative for - dysuria, hematuria, incontinence or urinary frequency/urgency Musculoskeletal ROS: "right leg has not worked in quite some time" Neurological ROS: as noted in HPI Dermatological ROS: negative for rash and skin lesion changes  Physical Examination: Blood pressure 140/80, pulse 52, temperature 98.1 F (36.7 C), temperature source Oral, resp. rate 18, height 5' 8.5" (1.74 m), weight 145.151 kg (320 lb), SpO2 93 %.  HEENT-  Normocephalic, no lesions, without obvious abnormality.  Normal external eye and conjunctiva.  Normal TM's bilaterally.  Normal auditory canals and external ears. Normal external nose, mucus  membranes and septum.  Normal pharynx. Cardiovascular- S1, S2 normal, pulses palpable throughout   Lungs- chest clear, no wheezing, rales, normal symmetric air entry Abdomen- soft, non-tender; bowel sounds normal; no masses,  no organomegaly Extremities- no edema Lymph-no adenopathy palpable Musculoskeletal-no joint tenderness, deformity or swelling Skin-warm and dry, no hyperpigmentation, vitiligo, or suspicious lesions  Neurological Examination Mental Status: Alert, oriented, thought content appropriate.  Speech fluent without evidence of aphasia.  Able to follow 3 step commands without difficulty. Cranial Nerves: II: Discs flat bilaterally; Visual fields grossly normal, pupils equal, round, reactive to light and accommodation III,IV, VI: ptosis not present, extra-ocular motions intact bilaterally V,VII: smile symmetric, facial light touch sensation normal bilaterally VIII: hearing normal bilaterally IX,X: gag reflex present XI: bilateral shoulder shrug XII: midline tongue extension Motor: Right : Upper extremity   5-/5    Left:     Upper extremity   5/5  Lower extremity   5-/5     Lower extremity   5-/5 Tone and bulk:normal tone throughout; no atrophy noted Sensory: Pinprick and light touch intact throughout, bilaterally Deep Tendon Reflexes: 2+ in the upper extremities, 1+ at the knees and absent at the ankles Plantars: Right: no hallux   Left: mute Cerebellar: Normal finger-to-nose and normal heel-to-shin testing bilaterally Gait: not tested due to safety concerns    Laboratory Studies:  Basic Metabolic Panel:  Recent Labs Lab 10/19/15 1555 10/20/15 0408  NA 140 141  K 3.9 3.5  CL 104 106  CO2 24 28  GLUCOSE 190* 168*  BUN 23* 24*  CREATININE 1.46* 1.19  CALCIUM 9.3 8.8*    Liver Function Tests: No results for input(s): AST, ALT, ALKPHOS, BILITOT, PROT, ALBUMIN in the last 168 hours. No results for input(s): LIPASE, AMYLASE in the last 168 hours. No results  for input(s): AMMONIA in the last 168 hours.  CBC:  Recent Labs Lab 10/19/15 1555 10/20/15 0408  WBC 5.5 5.0  HGB 12.8* 12.0*  HCT 39.5* 36.9*  MCV 77.4* 79.2*  PLT 241 197    Cardiac Enzymes:  Recent Labs Lab 10/19/15 1555  TROPONINI <0.03    BNP: Invalid input(s): POCBNP  CBG:  Recent Labs Lab 10/19/15 2139 10/20/15 0826 10/20/15 1143  GLUCAP 236* 190* 52*    Microbiology: Results for orders placed or performed during the hospital encounter of 05/20/15  Wound culture     Status: None   Collection Time: 05/20/15  2:00 PM  Result Value Ref Range Status   Specimen Description WOUND  Final   Special Requests NONE  Final   Gram Stain FEW WBC SEEN NO ORGANISMS SEEN   Final   Culture   Final    LIGHT GROWTH STAPHYLOCOCCUS LUGDUNENSIS WARNING: For oxacillin-resistant S.aureus and coagulase-negative staphylococci (MRS), other beta-lactam agents, ie, penicillins, beta-lactam/beta-lactamase inhibitor combinations, cephems (with the exception of the cephalosporins with anti-MRSA  activity), and carbapenems, may appear active in vitro, but are not effective clinically.  --CLSI, Vol.32 No.3, January 2012, pg 70.    Report Status 05/26/2015 FINAL  Final   Organism ID, Bacteria STAPHYLOCOCCUS LUGDUNENSIS  Final      Susceptibility   Staphylococcus lugdunensis - MIC*    CIPROFLOXACIN Value in next row Sensitive      SENSITIVE<=0.5    ERYTHROMYCIN Value in next row Sensitive      SENSITIVE<=0.25    GENTAMICIN Value in next row Sensitive      SENSITIVE<=0.5  OXACILLIN Value in next row Sensitive      SENSITIVE2 WARNING: For oxacillin-resistant S.aureus and coagulase-negative staphylococci (MRS), other beta-lactam agents, ie, penicillins, beta-lactam/beta-lactamase inhibitor combinations, cephems (with the exception of the cephalosporins with anti-MRSA activity), and carbapenems, may appear active in vitro, but are not effective clinically.  --CLSI, Vol.32 No.3, January  2012, pg 70.    TETRACYCLINE Value in next row Sensitive      SENSITIVE<=1    CLINDAMYCIN Value in next row Sensitive      SENSITIVE<=0.25    TRIMETH/SULFA Value in next row Sensitive      SENSITIVE<=10    VANCOMYCIN Value in next row Sensitive      SENSITIVE<=0.5    * LIGHT GROWTH STAPHYLOCOCCUS LUGDUNENSIS    Coagulation Studies: No results for input(s): LABPROT, INR in the last 72 hours.  Urinalysis:  Recent Labs Lab 10/19/15 2312  COLORURINE YELLOW*  LABSPEC 1.021  PHURINE 6.0  GLUCOSEU 50*  HGBUR NEGATIVE  BILIRUBINUR NEGATIVE  KETONESUR NEGATIVE  PROTEINUR NEGATIVE  NITRITE NEGATIVE  LEUKOCYTESUR NEGATIVE    Lipid Panel:    Component Value Date/Time   CHOL 223* 10/19/2015 1555   CHOL 180 08/30/2013 0439   TRIG 600* 10/19/2015 1555   TRIG 465* 08/30/2013 0439   HDL 32* 10/19/2015 1555   HDL 27* 08/30/2013 0439   CHOLHDL 7.0 10/19/2015 1555   VLDL UNABLE TO CALCULATE IF TRIGLYCERIDE OVER 400 mg/dL 10/19/2015 1555   VLDL SEE COMMENT 08/30/2013 0439   LDLCALC UNABLE TO CALCULATE IF TRIGLYCERIDE OVER 400 mg/dL 10/19/2015 1555   LDLCALC SEE COMMENT 08/30/2013 0439    HgbA1C:  Lab Results  Component Value Date   HGBA1C 7.8* 12/19/2014    Urine Drug Screen:     Component Value Date/Time   LABOPIA NEGATIVE 08/29/2013 1918   COCAINSCRNUR NEGATIVE 08/29/2013 1918   LABBENZ NEGATIVE 08/29/2013 1918   AMPHETMU NEGATIVE 08/29/2013 1918   THCU NEGATIVE 08/29/2013 1918   LABBARB NEGATIVE 08/29/2013 1918    Alcohol Level: No results for input(s): ETH in the last 168 hours.  Other results: EKG: sinus rhythm at 74 bpm.  Imaging: Ct Head Wo Contrast  10/19/2015  CLINICAL DATA:  Weakness.  Fall. EXAM: CT HEAD WITHOUT CONTRAST TECHNIQUE: Contiguous axial images were obtained from the base of the skull through the vertex without intravenous contrast. COMPARISON:  12/18/2014 head CT. FINDINGS: There is a relatively well marginated low-attenuation focus in the left  frontal corona radiata extending into the left putamen, new since 12/18/2014. No significant mass-effect. No evidence of parenchymal hemorrhage or extra-axial fluid collection. No midline shift. Intracranial atherosclerosis. Cerebral volume is age appropriate. No ventriculomegaly. The visualized paranasal sinuses are essentially clear. The mastoid air cells are unopacified. No evidence of calvarial fracture. IMPRESSION: Relatively well marginated low-attenuation focus in the left frontal corona radiata/left basal ganglia, new since 12/18/2014, most suggestive of a subacute infarct. No significant mass effect. No acute intracranial hemorrhage. These results were called by telephone at the time of interpretation on 10/19/2015 at 5:27 pm to [place], who verbally acknowledged these results. Electronically Signed   By: Ilona Sorrel M.D.   On: 10/19/2015 17:33   US Carotid Bilateral  10/20/2015  CLINICAL DATA:  CVA, slurred speech, weakness, hyperlipidemia. EXAM: BILATERAL CAROTID DUPLEX ULTRASOUND TECHNIQUE: Pearline Cables scale imaging, color Doppler and duplex ultrasound were performed of bilateral carotid and vertebral arteries in the neck. COMPARISON:  None. FINDINGS: Criteria: Quantification of carotid stenosis is based on velocity parameters that correlate the  residual internal carotid diameter with NASCET-based stenosis levels, using the diameter of the distal internal carotid lumen as the denominator for stenosis measurement. The following velocity measurements were obtained: RIGHT ICA:  48/15 cm/sec CCA:  [id number] cm/sec SYSTOLIC ICA/CCA RATIO:  0.5 DIASTOLIC ICA/CCA RATIO:  1.2 ECA:  67 cm/sec LEFT ICA:  48/15 cm/sec CCA:  0000000 cm/sec SYSTOLIC ICA/CCA RATIO:  0.6 DIASTOLIC ICA/CCA RATIO:  1.6 ECA:  60 cm/sec RIGHT CAROTID ARTERY: Minor echogenic shadowing plaque formation. No hemodynamically significant right ICA stenosis, velocity elevation, or turbulent flow. Degree of narrowing less than 50%. RIGHT VERTEBRAL  ARTERY:  Antegrade LEFT CAROTID ARTERY: Similar scattered minor echogenic plaque formation. No hemodynamically significant left ICA stenosis, velocity elevation, or turbulent flow. LEFT VERTEBRAL ARTERY:  Antegrade IMPRESSION: Minor carotid atherosclerosis. No hemodynamically significant ICA stenosis. Degree of narrowing less than 50% bilaterally. Patent antegrade vertebral flow bilaterally. Electronically Signed   By: Jerilynn Mages.  Shick M.D.   On: 10/20/2015 10:25   Dg Hip Unilat  With Pelvis 2-3 Views Right  10/19/2015  CLINICAL DATA:  Fall 2 days ago from standing position with right hip pain, initial encounter EXAM: DG HIP (WITH OR WITHOUT PELVIS) 2-3V RIGHT COMPARISON:  None. FINDINGS: Mild degenerative changes in the lumbar spine are seen. The pelvic ring is intact. No acute fracture or dislocation is noted. A stimulating device is noted over the left iliac wing. IMPRESSION: No acute abnormality noted. Electronically Signed   By: Inez Catalina M.D.   On: 10/19/2015 17:38    Assessment: 65 y.o. male with multiple medical problems including prior strokes, ataxia, hypertension, insulin-dependent diabetes mellitus, CAD status post prior stents who presents with frequent falls and slurred speech.  On Plavix and ASA at home.  Head CT personally reviewed and shows a left frontal hypodensity that is new since August 2016.  Patient unable to have MRI.  Carotid dopplers show no evidence of hemodynamically significant stenosis.  Echocardiogram pending.  LDL unable to calculate due to triglycerides of 600.  Stroke Risk Factors - diabetes mellitus, hyperlipidemia and hypertension  Plan: 1. HgbA1c 2. PT consult, OT consult, Speech consult 3. Echocardiogram pending 4. Prophylactic therapy-Continue ASA and Plavix 5. Telemetry monitoring 6. Frequent neuro checks 7. Would consider holter monitor as an outpatient 8.  Agree with management of risk factors to include lipid management   Alexis Goodell,  MD Neurology [phone] 10/20/2015, 1:00 PM

## 2015-10-20 NOTE — Care Management Important Message (Signed)
Important Message  Patient Details  Name: RIDGE DUMOND MRN: [id number] Date of Birth: Aug 04, 1950   Medicare Important Message Given:  Yes    Shelbie Ammons, RN 10/20/2015, 11:35 AM

## 2015-10-20 NOTE — Progress Notes (Signed)
Order received, chart reviewed, nursing and pt consulted. Pt reports that he initially experienced some slurred speech but according to pt it has resolved and he doesn't have any further concerns. Nursing present and further confirms pt's ability. Pt consuming medicine without s/s of aspiration/dysphagia. Therefore skilled ST doesn't appear indicated. If pt's condition changes, ST can be re-consulted.

## 2015-10-20 NOTE — Progress Notes (Signed)
Inpatient Diabetes Program Recommendations  AACE/ADA: New Consensus Statement on Inpatient Glycemic Control (2015)  Target Ranges:  Prepandial:   less than 140 mg/dL      Peak postprandial:   less than 180 mg/dL (1-2 hours)      Critically ill patients:  140 - 180 mg/dL     Review of Glycemic Control Results for Victor Wise, Victor Wise (MRN [id number]) as of 10/20/2015 15:33  Ref. Range 10/19/2015 21:39 10/20/2015 08:26 10/20/2015 11:43  Glucose-Capillary Latest Ref Range: 65-99 mg/dL 236 (H) 190 (H) 259 (H)   Diabetes history: Type 2 diabetes Outpatient Diabetes medications: Lantus 60 units q HS, Novolin R 0-15 untis tid with meals Current orders for Inpatient glycemic control:  Novolog sensitive tid with meals and HS, Lantus 60 untis q HS Inpatient Diabetes Program Recommendations:    Please consider adding Novolog meal coverage 8 units tid with meals (to be held if patient eats less than 50%).  Thanks, Adah Perl, RN, BC-ADM Inpatient Diabetes Coordinator Pager [phone] (8a-5p)

## 2015-10-20 NOTE — Evaluation (Signed)
Physical Therapy Evaluation Patient Details Name: Victor Wise MRN: [id number] DOB: 1951/01/14 Today's Date: 10/20/2015   History of Present Illness  Pt is a 65 y.o. male presenting to hospital s/p multiple falls (pt reporting "blacked" out) and with R hip pain.  R pelvis x-ray negative for fx.  PMH includes DM, htn, BPH, osteomyelitis of foot, morbid obesity, OSA, chronic back pain, depression, UTI, s/p insertion of spinal cord stimulator, heart attack, stroke , basal cell carcinoma (forehead), SIRS, R toe amp, cardiac cath, ataxia, h/o falls.  Clinical Impression  Prior to admission, pt reports ambulating with cane but has had multiple falls recently.  Pt lives with family and has stairs to enter home.  Currently pt is CGA with transfers and ambulation 30 feet with bariatric RW; limited assessment d/t pt declining to participate more during session; pt stating "I'm fed up with it all" (MD Gouru present and aware of pt's comments).  Pt also demonstrates impaired safety awareness and was impulsive during session.  Pt would benefit from skilled PT to address noted impairments and functional limitations.  Recommend pt discharge to home with 24/7 assist for all functional mobility (with use of bariatric RW for safety) when medically appropriate.  Pt currently declining HHPT and use of RW (pt stating that he was "100% better" since he had his stroke): CM notified of above.     Follow Up Recommendations Home health PT;Supervision/Assistance - 24 hour (pt declining HHPT and use of walker)    Equipment Recommendations   (Bariatric RW)    Recommendations for Other Services       Precautions / Restrictions Precautions Precautions: Fall Restrictions Weight Bearing Restrictions: No      Mobility  Bed Mobility Overal bed mobility: Needs Assistance Bed Mobility: Supine to Sit;Sit to Supine     Supine to sit: Supervision;HOB elevated Sit to supine: Modified independent (Device/Increase time);HOB  elevated   General bed mobility comments: significant increased time and effort to go supine to sit  Transfers Overall transfer level: Needs assistance Equipment used:  (Bariatric RW) Transfers: Sit to/from Stand Sit to Stand: Min guard         General transfer comment: strong stand without loss of balance  Ambulation/Gait Ambulation/Gait assistance: Min guard Ambulation Distance (Feet): 30 Feet Assistive device:  (Bariatric RW) Gait Pattern/deviations: Decreased step length - right;Decreased step length - left;Wide base of support     General Gait Details: limited distance d/t pt declining to ambulate further; steady without loss of balance using bariatric RW  Stairs Stairs:  (pt declined stairs)          Wheelchair Mobility    Modified Rankin (Stroke Patients Only)       Balance Overall balance assessment: Needs assistance;History of Falls Sitting-balance support: No upper extremity supported;Feet supported Sitting balance-Leahy Scale: Good     Standing balance support: Bilateral upper extremity supported (on RW) Standing balance-Leahy Scale: Good                               Pertinent Vitals/Pain Pain Assessment: Faces Faces Pain Scale: No hurt  Vitals stable and WFL throughout treatment session.    Home Living Family/patient expects to be discharged to:: Private residence Living Arrangements: Other relatives (Sister and her husband) Available Help at Discharge: Family   Home Access: Stairs to enter   Entrance Stairs-Number of Steps: 2 vs 4 steps to enter   Home Equipment: Gilford Rile -  2 wheels;Cane - single point;Cane - quad      Prior Function Level of Independence: Independent with assistive device(s)         Comments: Pt using cane prior to admission.  Pt reports multiple falls recently.     Hand Dominance        Extremity/Trunk Assessment   Upper Extremity Assessment: Generalized weakness (limited assessment d/t pt's  decreased willingness to participate)           Lower Extremity Assessment: Generalized weakness (limited assessment d/t pt's decreased willingness to participate)      Cervical / Trunk Assessment: Kyphotic  Communication   Communication: No difficulties  Cognition Arousal/Alertness: Awake/alert Behavior During Therapy: Flat affect;Impulsive (decreased mood (MD present and aware)) Overall Cognitive Status: Within Functional Limits for tasks assessed (Oriented to person and DOB)                      General Comments   Nursing cleared pt for participation in physical therapy.  Pt agreeable to limited PT session.    Exercises        Assessment/Plan    PT Assessment Patient needs continued PT services  PT Diagnosis Difficulty walking;Generalized weakness   PT Problem List Decreased mobility;Decreased safety awareness;Decreased knowledge of precautions  PT Treatment Interventions DME instruction;Gait training;Stair training;Functional mobility training;Therapeutic activities;Therapeutic exercise;Balance training;Patient/family education   PT Goals (Current goals can be found in the Care Plan section) Acute Rehab PT Goals Patient Stated Goal: to get out of hospital PT Goal Formulation: With patient Time For Goal Achievement: 11/03/15 Potential to Achieve Goals: Good    Frequency Min 2X/week   Barriers to discharge Decreased caregiver support      Co-evaluation               End of Session Equipment Utilized During Treatment: Gait belt Activity Tolerance: Patient tolerated treatment well Patient left: in bed;with call bell/phone within reach;with bed alarm set (MD in room) Nurse Communication: Mobility status         Time: [id number] PT Time Calculation (min) (ACUTE ONLY): 18 min   Charges:   PT Evaluation $PT Eval Low Complexity: 1 Procedure     PT G CodesLeitha Bleak 11-06-2015, 2:45 PM Leitha Bleak, [place]

## 2015-10-21 ENCOUNTER — Inpatient Hospital Stay: Payer: Commercial Managed Care - HMO

## 2015-10-21 LAB — GLUCOSE, CAPILLARY
GLUCOSE-CAPILLARY: 205 mg/dL — AB (ref 65–99)
GLUCOSE-CAPILLARY: 235 mg/dL — AB (ref 65–99)
Glucose-Capillary: 251 mg/dL — ABNORMAL HIGH (ref 65–99)
Glucose-Capillary: 168 mg/dL — ABNORMAL HIGH (ref 65–99)

## 2015-10-21 MED ORDER — INSULIN ASPART 100 UNIT/ML ~~LOC~~ SOLN
8.0000 [IU] | Freq: Three times a day (TID) | SUBCUTANEOUS | Status: DC
  Administered 2015-10-21 (×2): 8 [IU] via SUBCUTANEOUS
  Filled 2015-10-21 (×2): qty 8

## 2015-10-21 MED ORDER — CLOPIDOGREL BISULFATE 75 MG PO TABS
75.0000 mg | ORAL_TABLET | Freq: Every day | ORAL | Status: DC
Start: 2015-10-21 — End: 2017-05-29

## 2015-10-21 MED ORDER — OMEGA-3-ACID ETHYL ESTERS 1 G PO CAPS
1.0000 g | ORAL_CAPSULE | Freq: Two times a day (BID) | ORAL | Status: DC
  Administered 2015-10-21: 1 g via ORAL
  Filled 2015-10-21: qty 1

## 2015-10-21 MED ORDER — OMEGA-3-ACID ETHYL ESTERS 1 G PO CAPS: 1.0000 g | ORAL_CAPSULE | Freq: Two times a day (BID) | ORAL | Status: DC

## 2015-10-21 MED ORDER — FENOFIBRATE 160 MG PO TABS: 160.0000 mg | ORAL_TABLET | Freq: Every evening | ORAL | Status: DC

## 2015-10-21 NOTE — Care Management (Signed)
Notified by CSW that per RN patient now agreeable to home health. I have requested home health through [facility] as patient has used them in the past. I have requested RN, PT, SW, and CNA from Dr. Margaretmary Eddy. Daughter said she would pick him up after 7pm. She did not say where he would be going to stay. Case closed.
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
## 2015-10-21 NOTE — Discharge Summary (Signed)
[facility] NAME: Victor Wise    MR#:  [id number]  DATE OF BIRTH:  1951/04/20  DATE OF ADMISSION:  10/19/2015 ADMITTING PHYSICIAN: Gladstone Lighter, MD  DATE OF DISCHARGE: 10/21/2015 PRIMARY CARE PHYSICIAN: Lavera Guise, MD    ADMISSION DIAGNOSIS:  Weakness [R53.1] CVA (cerebral infarction) [I63.9] Fall, initial encounter [W19.XXXA] Contusion, hip and thigh, right, initial encounter [S70.01XA, S70.11XA] Cerebral infarction due to unspecified mechanism [I63.9]  DISCHARGE DIAGNOSIS:  Principal Problem:   Adjustment disorder with mixed anxiety and depressed mood Active Problems:   CVA (cerebral infarction)   Dysthymia   SECONDARY DIAGNOSIS:   Past Medical History  Diagnosis Date  . Diabetes ([place])   . Hypertension   . BPH (benign prostatic hyperplasia)   . 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 Morbid obesity ([place])   . Obstructive sleep apnea   . Chronic back pain   . Depression   . UTI (lower urinary tract infection)   . Status post insertion of spinal cord stimulator   . Heart attack ([place])   . Stroke ([place])   . Basal cell carcinoma     forehead  . GERD (gastroesophageal reflux disease)   . Allergy     HOSPITAL COURSE:   Victor Wise is a 65 y.o. male with a known history of Obesity, prior strokes, ataxia, hypertension, insulin-dependent diabetes mellitus, CAD status post prior stents presents to hospital secondary to weakness, balance problems and also fall today. Please review history and physical for complete details  # acute CVA- Presenting as balance problems and falls. Also prior history of ataxia and multiple falls in the past. No focal weakness noted. Cannot get an MRI done due to his spinal stimulator. -Continue aspirin and Plavix. Patient is allergic to statins. Continue gemfibrozil and omega-3 fatty acid is added -lipid panel, triglycerides 600 LDL was not calculated because of the triglyceridemia.  Increase dose of gemfibrozil. Patient is allergic to statin. Started omega-3 fatty acids . Physical therapy, occupational therapy is recommending home health PT and 24 hour supervision, recommending walker but patient is refusing Neurology is requesting outpatient Holter monitoring, outpatient follow-up with cardiology as recommended. 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 Denies any physical abuse Appreciate psychiatry's recommendations. Doesn't need inpatient psych care criteria Okay to discharge patient from psychiatry standpoint. Patient does not engage in outpatient psychotherapy. Not interested to getting engazed in any new treatment   # hypertension-On hydrochlorothiazide, benazepril, Norvasc.  # chronic low back pain- cont pain meds  # diabetes mellitus-Continue Lantus and added sliding scale insulin. Also on [place] at home- not on formulary here.  # GERD- protonix  # CAD-Stable. Continue cardiac medications  # DVT Prophylaxis- lovenox  DISCHARGE CONDITIONS:  stable  CONSULTS OBTAINED:  Treatment Team:  Alexis Goodell, MD Catarina Hartshorn, MD Gonzella Lex, MD   PROCEDURES none  DRUG ALLERGIES:   Allergies  Allergen Reactions  . Amoxicillin Shortness Of Breath, Itching and Other (See Comments)    Has patient had a PCN reaction causing immediate rash, facial/tongue/throat swelling, SOB or lightheadedness with hypotension: Yes Has patient had a PCN reaction causing severe rash involving mucus membranes or skin necrosis: No Has patient had a PCN reaction that required hospitalization No Has patient had a PCN reaction occurring within the  last 10 years: No If all of the above answers are "NO", then may proceed with Cephalosporin use.  . Other  Anaphylaxis, Itching and Other (See Comments)    Pt states that he is allergic to Endopa.  Reaction:  Anaphylaxis  Pt states that he is allergic to Metabisulfites Reaction:  Itching   . Penicillins Shortness Of Breath, Itching and Other (See Comments)    Has patient had a PCN reaction causing immediate rash, facial/tongue/throat swelling, SOB or lightheadedness with hypotension: Yes Has patient had a PCN reaction causing severe rash involving mucus membranes or skin necrosis: No Has patient had a PCN reaction that required hospitalization No Has patient had a PCN reaction occurring within the last 10 years: No If all of the above answers are "NO", then may proceed with Cephalosporin use.  [name] Hydralazine Other (See Comments)    Reaction:  Cramping of extremities  . Crestor [Rosuvastatin Calcium] Other (See Comments)    Reaction:  Pt is unable to move arms/legs   . Metoprolol Nausea And Vomiting  . Statins Other (See Comments)    Reaction:  Pt is unable to move arms/legs  . Sulfa Antibiotics Itching  . Yellow Dyes (Non-Tartrazine) Itching  . Aspirin Itching and Other (See Comments)    Pt states that he is able to use in lower doses.    . Tape Rash    DISCHARGE MEDICATIONS:   Current Discharge Medication List    START taking these medications   Details  omega-3 acid ethyl esters (LOVAZA) 1 g capsule Take 1 capsule (1 g total) by mouth 2 (two) times daily. Qty: 60 capsule, Refills: 0      CONTINUE these medications which have CHANGED   Details  clopidogrel (PLAVIX) 75 MG tablet Take 1 tablet (75 mg total) by mouth daily. Qty: 30 tablet, Refills: 0    fenofibrate 160 MG tablet Take 1 tablet (160 mg total) by mouth every evening. Qty: 30 tablet, Refills: 0      CONTINUE these medications which have NOT CHANGED   Details  amLODipine (NORVASC) 10 MG tablet Take 10 mg by mouth daily.    aspirin EC 81 MG tablet Take 81 mg by mouth daily.    benazepril (LOTENSIN) 40 MG tablet Take  40 mg by mouth 2 (two) times daily.     buPROPion (WELLBUTRIN XL) 150 MG 24 hr tablet Take 300 mg by mouth daily.     busPIRone (BUSPAR) 10 MG tablet Take 10 mg by mouth at bedtime.    cetirizine (ZYRTEC) 10 MG tablet Take 10 mg by mouth at bedtime as needed for allergies.     DULoxetine (CYMBALTA) 30 MG capsule Take 30-60 mg by mouth 2 (two) times daily. Pt takes two capsules in the morning and one at night.    Exenatide ER (BYDUREON) 2 MG PEN Inject 2 mg into the skin once a week. Pt uses on Sunday.    fluticasone (FLONASE) 50 MCG/ACT nasal spray Place 2 sprays into both nostrils daily.    hydrochlorothiazide (MICROZIDE) 12.5 MG capsule Take 12.5 mg by mouth daily.    insulin glargine (LANTUS) 100 UNIT/ML injection Inject 60 Units into the skin at bedtime.    insulin regular (NOVOLIN R,HUMULIN R) 100 units/mL injection Inject 0-15 Units into the skin 3 (three) times daily with meals as needed for high blood sugar. Pt uses as needed per sliding scale.    metaxalone (SKELAXIN) 800 MG tablet Take 400-800 mg by mouth 3 (three)  times daily as needed for muscle spasms.    NARCAN 4 MG/0.1ML LIQD Take 4 mg by mouth as needed (for opioid overdose).     nitroGLYCERIN (NITROSTAT) 0.4 MG SL tablet Place 1 tablet (0.4 mg total) under the tongue every 5 (five) minutes x 3 doses as needed for chest pain. Qty: 25 tablet, Refills: 1    Oxcarbazepine (TRILEPTAL) 300 MG tablet Take 300-600 mg by mouth 2 (two) times daily. Pt takes one tablet in the morning and two at night.    oxyCODONE (OXY IR/ROXICODONE) 5 MG immediate release tablet Take 5 mg by mouth 3 (three) times daily as needed for severe pain.     OxyCODONE (OXYCONTIN) 20 mg T12A 12 hr tablet Take 20 mg by mouth every 12 (twelve) hours.     pantoprazole (PROTONIX) 40 MG tablet Take 40 mg by mouth daily.    pregabalin (LYRICA) 200 MG capsule Take 200-400 mg by mouth 2 (two) times daily. Pt takes two capsules in the morning and one at night.     tamsulosin (FLOMAX) 0.4 MG CAPS capsule Take 0.4 mg by mouth daily after supper.          DISCHARGE INSTRUCTIONS:   Activity as tolerated per PT recommendations Outpatient follow-up with the primary care physician and neurology in a week  outpatient follow-up with cardiology in 2-3 days for Holter monitoring  Outpatient follow-up with psychologist for continuation of therapy and psychiatrist as recommended Diabetic and cardiac diet  DIET:  Cardiac diet, diabetic  DISCHARGE CONDITION:  Stable  ACTIVITY:  Activity as tolerated per PT  OXYGEN:  Home Oxygen: No.   Oxygen Delivery: room air  DISCHARGE LOCATION:  home   If you experience worsening of your admission symptoms, develop shortness of breath, life threatening emergency, suicidal or homicidal thoughts you must seek medical attention immediately by calling 911 or calling your MD immediately  if symptoms less severe.  You Must read complete instructions/literature along with all the possible adverse reactions/side effects for all the Medicines you take and that have been prescribed to you. Take any new Medicines after you have completely understood and accpet all the possible adverse reactions/side effects.   Please note  You were cared for by a hospitalist during your hospital stay. If you have any questions about your discharge medications or the care you received while you were in the hospital after you are discharged, you can call the unit and asked to speak with the hospitalist on call if the hospitalist that took care of you is not available. Once you are discharged, your primary care physician will handle any further medical issues. Please note that NO REFILLS for any discharge medications will be authorized once you are discharged, as it is imperative that you return to your primary care physician (or establish a relationship with a primary care physician if you do not have one) for your aftercare needs so that  they can reassess your need for medications and monitor your lab values.     Today  Chief Complaint  Patient presents with  . Fall  . Hip Pain   Patient is reporting right-sided hip pain x-ray has revealed arthritis but no fractures. Refusing home health arrangements Refusing most of the treatments. Discussed with case manager and Education officer, museum , please review their note  ROS:  CONSTITUTIONAL: Denies fevers, chills. Denies any fatigue, weakness.  EYES: Denies blurry vision, double vision, eye pain. EARS, NOSE, THROAT: Denies tinnitus, ear pain, hearing loss. RESPIRATORY:  Denies cough, wheeze, shortness of breath.  CARDIOVASCULAR: Denies chest pain, palpitations, edema.  GASTROINTESTINAL: Denies nausea, vomiting, diarrhea, abdominal pain. Denies bright red blood per rectum. GENITOURINARY: Denies dysuria, hematuria. ENDOCRINE: Denies nocturia or thyroid problems. HEMATOLOGIC AND LYMPHATIC: Denies easy bruising or bleeding. SKIN: Denies rash or lesion. MUSCULOSKELETAL:Right hip pain   NEUROLOGIC: Denies paralysis, paresthesias.  PSYCHIATRIC: Denies anxiety or depressive symptoms.   VITAL SIGNS:  Blood pressure 129/52, pulse 52, temperature 98.4 F (36.9 C), temperature source Oral, resp. rate 20, height 5' 8.5" (1.74 m), weight 145.151 kg (320 lb), SpO2 95 %.  I/O:    Intake/Output Summary (Last 24 hours) at 10/21/15 1447 Last data filed at 10/21/15 1300  Gross per 24 hour  Intake    431 ml  Output    550 ml  Net   -119 ml    PHYSICAL EXAMINATION:  GENERAL:  65 y.o.-year-old patient lying in the bed with no acute distress. Morbidly obese  EYES: Pupils equal, round, reactive to light and accommodation. No scleral icterus. Extraocular muscles intact.  HEENT: Head atraumatic, normocephalic. Oropharynx and nasopharynx clear.  NECK:  Supple, no jugular venous distention. No thyroid enlargement, no tenderness.  LUNGS: Normal breath sounds bilaterally, no wheezing,  rales,rhonchi or crepitation. No use of accessory muscles of respiration.  CARDIOVASCULAR: S1, S2 normal. No murmurs, rubs, or gallops.  ABDOMEN: Soft, non-tender, non-distended. Bowel sounds present. No organomegaly or mass.  EXTREMITIES:  right hip range of motion is slightly limited from  Tenderness. No pedal edema, cyanosis, or clubbing.  NEUROLOGIC: Cranial nerves II through XII are intact. Muscle strength 5/5 in all extremities Except right lower extremity motor 3-4 out of 5. Sensation intact. Gait not checked.  PSYCHIATRIC: The patient is alert and oriented x 3.  SKIN: No obvious rash, lesion, or ulcer.   DATA REVIEW:   CBC  Recent Labs Lab 10/20/15 0408  WBC 5.0  HGB 12.0*  HCT 36.9*  PLT 197    Chemistries   Recent Labs Lab 10/20/15 0408  NA 141  K 3.5  CL 106  CO2 28  GLUCOSE 168*  BUN 24*  CREATININE 1.19  CALCIUM 8.8*    Cardiac Enzymes  Recent Labs Lab 10/19/15 1555  TROPONINI <0.03    Microbiology Results  Results for orders placed or performed during the hospital encounter of 05/20/15  Wound culture     Status: None   Collection Time: 05/20/15  2:00 PM  Result Value Ref Range Status   Specimen Description WOUND  Final   Special Requests NONE  Final   Gram Stain FEW WBC SEEN NO ORGANISMS SEEN   Final   Culture   Final    LIGHT GROWTH STAPHYLOCOCCUS LUGDUNENSIS WARNING: For oxacillin-resistant S.aureus and coagulase-negative staphylococci (MRS), other beta-lactam agents, ie, penicillins, beta-lactam/beta-lactamase inhibitor combinations, cephems (with the exception of the cephalosporins with anti-MRSA  activity), and carbapenems, may appear active in vitro, but are not effective clinically.  --CLSI, Vol.32 No.3, January 2012, pg 70.    Report Status 05/26/2015 FINAL  Final   Organism ID, Bacteria STAPHYLOCOCCUS LUGDUNENSIS  Final      Susceptibility   Staphylococcus lugdunensis - MIC*    CIPROFLOXACIN Value in next row Sensitive       SENSITIVE<=0.5    ERYTHROMYCIN Value in next row Sensitive      SENSITIVE<=0.25    GENTAMICIN Value in next row Sensitive      SENSITIVE<=0.5    OXACILLIN Value in next row Sensitive  SENSITIVE2 WARNING: For oxacillin-resistant S.aureus and coagulase-negative staphylococci (MRS), other beta-lactam agents, ie, penicillins, beta-lactam/beta-lactamase inhibitor combinations, cephems (with the exception of the cephalosporins with anti-MRSA activity), and carbapenems, may appear active in vitro, but are not effective clinically.  --CLSI, Vol.32 No.3, January 2012, pg 70.    TETRACYCLINE Value in next row Sensitive      SENSITIVE<=1    CLINDAMYCIN Value in next row Sensitive      SENSITIVE<=0.25    TRIMETH/SULFA Value in next row Sensitive      SENSITIVE<=10    VANCOMYCIN Value in next row Sensitive      SENSITIVE<=0.5    * LIGHT GROWTH STAPHYLOCOCCUS LUGDUNENSIS    RADIOLOGY:  Ct Head Wo Contrast  10/19/2015  CLINICAL DATA:  Weakness.  Fall. EXAM: CT HEAD WITHOUT CONTRAST TECHNIQUE: Contiguous axial images were obtained from the base of the skull through the vertex without intravenous contrast. COMPARISON:  12/18/2014 head CT. FINDINGS: There is a relatively well marginated low-attenuation focus in the left frontal corona radiata extending into the left putamen, new since 12/18/2014. No significant mass-effect. No evidence of parenchymal hemorrhage or extra-axial fluid collection. No midline shift. Intracranial atherosclerosis. Cerebral volume is age appropriate. No ventriculomegaly. The visualized paranasal sinuses are essentially clear. The mastoid air cells are unopacified. No evidence of calvarial fracture. IMPRESSION: Relatively well marginated low-attenuation focus in the left frontal corona radiata/left basal ganglia, new since 12/18/2014, most suggestive of a subacute infarct. No significant mass effect. No acute intracranial hemorrhage. These results were called by telephone at the time of  interpretation on 10/19/2015 at 5:27 pm to [place], who verbally acknowledged these results. Electronically Signed   By: Ilona Sorrel M.D.   On: 10/19/2015 17:33   US Carotid Bilateral  10/20/2015  CLINICAL DATA:  CVA, slurred speech, weakness, hyperlipidemia. EXAM: BILATERAL CAROTID DUPLEX ULTRASOUND TECHNIQUE: Pearline Cables scale imaging, color Doppler and duplex ultrasound were performed of bilateral carotid and vertebral arteries in the neck. COMPARISON:  None. FINDINGS: Criteria: Quantification of carotid stenosis is based on velocity parameters that correlate the residual internal carotid diameter with NASCET-based stenosis levels, using the diameter of the distal internal carotid lumen as the denominator for stenosis measurement. The following velocity measurements were obtained: RIGHT ICA:  48/15 cm/sec CCA:  [id number] cm/sec SYSTOLIC ICA/CCA RATIO:  0.5 DIASTOLIC ICA/CCA RATIO:  1.2 ECA:  67 cm/sec LEFT ICA:  48/15 cm/sec CCA:  0000000 cm/sec SYSTOLIC ICA/CCA RATIO:  0.6 DIASTOLIC ICA/CCA RATIO:  1.6 ECA:  60 cm/sec RIGHT CAROTID ARTERY: Minor echogenic shadowing plaque formation. No hemodynamically significant right ICA stenosis, velocity elevation, or turbulent flow. Degree of narrowing less than 50%. RIGHT VERTEBRAL ARTERY:  Antegrade LEFT CAROTID ARTERY: Similar scattered minor echogenic plaque formation. No hemodynamically significant left ICA stenosis, velocity elevation, or turbulent flow. LEFT VERTEBRAL ARTERY:  Antegrade IMPRESSION: Minor carotid atherosclerosis. No hemodynamically significant ICA stenosis. Degree of narrowing less than 50% bilaterally. Patent antegrade vertebral flow bilaterally. Electronically Signed   By: Jerilynn Mages.  Shick M.D.   On: 10/20/2015 10:25   Dg Hip Unilat  With Pelvis 2-3 Views Right  10/19/2015  CLINICAL DATA:  Fall 2 days ago from standing position with right hip pain, initial encounter EXAM: DG HIP (WITH OR WITHOUT PELVIS) 2-3V RIGHT COMPARISON:  None. FINDINGS: Mild  degenerative changes in the lumbar spine are seen. The pelvic ring is intact. No acute fracture or dislocation is noted. A stimulating device is noted over the left iliac wing. IMPRESSION: No acute abnormality noted.  Electronically Signed   By: Inez Catalina M.D.   On: 10/19/2015 17:38   Dg Femur, Min 2 Views Right  10/21/2015  CLINICAL DATA:  Pain EXAM: RIGHT FEMUR 2 VIEWS COMPARISON:  None. FINDINGS: Frontal and lateral views were obtained. There is no demonstrable fracture or dislocation. No abnormal periosteal reaction. There is moderate osteoarthritic change the right hip and knee joint regions. No knee joint effusion evident. A stent is noted in the superficial femoral artery and portion of popliteal artery on the right. IMPRESSION: Osteoarthritic change in the knee and hip joints, more severe in the knee region. Vascular stent present. No fracture or dislocation. No erosive change or bony destruction. No abnormal periosteal reaction. Electronically Signed   By: Lowella Grip III M.D.   On: 10/21/2015 12:30    EKG:   Orders placed or performed during the hospital encounter of 10/19/15  . ED EKG  . ED EKG  . EKG 12-Lead  . EKG 12-Lead      Management plans discussed with the patient, family and they are in agreement.  CODE STATUS:     Code Status Orders        Start     Ordered   10/19/15 2158  Full code   Continuous     10/19/15 2200    Code Status History    Date Active Date Inactive Code Status Order ID Comments User Context   12/19/2014  9:31 PM 12/21/2014  5:10 PM Full Code [id number]  Charolette Forward, MD Inpatient   12/19/2014  9:23 PM 12/19/2014  9:31 PM Full Code [id number]  Charolette Forward, MD Inpatient   12/19/2014 12:38 PM 12/19/2014  5:18 PM Full Code [id number]  Dionisio Dash, MD Inpatient   12/18/2014  6:36 PM 12/19/2014 12:38 PM Full Code [id number]  Henreitta Leber, MD Inpatient    Advance Directive Documentation        Most Recent Value   Type of Advance Directive  Living will    Pre-existing out of facility DNR order (yellow form or pink MOST form)     "MOST" Form in Place?        TOTAL TIME TAKING CARE OF THIS PATIENT: 45 minutes.   Note: This dictation was prepared with Dragon dictation along with smaller phrase technology. Any transcriptional errors that result from this process are unintentional.   @[facility] @  on 10/21/2015 at 2:47 PM  Between 7am to 6pm - Pager - [phone]  After 6pm go to [email or web address] - password [email or web address] [facility]  Office  [phone]  CC: Primary care physician; Lavera Guise, MD

## 2015-10-21 NOTE — Progress Notes (Signed)
Orders to discharge patient to home. Discharge instructions reviewed with patient and daughter. F/u appointments and prescriptions given. All belongings returned to patient. They verbalized understanding of discharge. IV removed per policy and procedure. No signs of infection noted. Pt taken out via w/c by nursing staff to private vehicle.

## 2015-10-21 NOTE — Discharge Instructions (Signed)
Activity as tolerated per PT recommendations Outpatient follow-up with the primary care physician and neurology in a week Outpatient follow-up with psychologist for continuation of therapy and psychiatrist as recommended Diabetic and cardiac diet

## 2015-10-21 NOTE — Care Management (Signed)
Patient allowed CM visit today. He is still refusing home health PT.  He is still angry with his sister and has made up his mind that he "will not go back to stay with his sister". He states his daughter has 4 children and does not have room for him. "She works until 5 pm and may have something to do with the children after work." I offered spiritual counsel- he said his pastor has visited (yesterday). I asked patient to make amends with his sister because he would need a place to stay and would be discharging today- he refused. I advised patient to seek a place to go (sister's or daughter's) or he would need to go to the homeless shelter. Discussed with RN and CSW. Patient should be able to ride in a car if he can find someone to pick him up. He declined any other CM services- My number was shared if and when he changes his mind. Case closed.

## 2015-10-22 DIAGNOSIS — M545 Low back pain: Secondary | ICD-10-CM | POA: Diagnosis not present

## 2015-10-22 DIAGNOSIS — G4733 Obstructive sleep apnea (adult) (pediatric): Secondary | ICD-10-CM | POA: Diagnosis not present

## 2015-10-22 DIAGNOSIS — F341 Dysthymic disorder: Secondary | ICD-10-CM | POA: Diagnosis not present

## 2015-10-22 DIAGNOSIS — I69393 Ataxia following cerebral infarction: Secondary | ICD-10-CM | POA: Diagnosis not present

## 2015-10-22 DIAGNOSIS — I10 Essential (primary) hypertension: Secondary | ICD-10-CM | POA: Diagnosis not present

## 2015-10-22 DIAGNOSIS — E119 Type 2 diabetes mellitus without complications: Secondary | ICD-10-CM | POA: Diagnosis not present

## 2015-10-22 DIAGNOSIS — G8929 Other chronic pain: Secondary | ICD-10-CM | POA: Diagnosis not present

## 2015-10-22 DIAGNOSIS — F4323 Adjustment disorder with mixed anxiety and depressed mood: Secondary | ICD-10-CM | POA: Diagnosis not present

## 2015-10-22 DIAGNOSIS — I2510 Atherosclerotic heart disease of native coronary artery without angina pectoris: Secondary | ICD-10-CM | POA: Diagnosis not present

## 2015-10-22 NOTE — Care Management (Signed)
Post discharge: Patient's daughter called stating that her step-son is at his mom's and patient is sleeping in his bed at her house. [facility] has called her and arranged and they are coming out to her house to see patient. I have notified Corene Cornea with [facility] that daughter would like them to call her- [phone]. Advised daughter to take patient to social security office to arrange long-term care with Medicaid. Patient did not qualify for Medicaid because he draws "$100 too much through disability" per daughter.

## 2015-10-23 ENCOUNTER — Other Ambulatory Visit: Payer: Self-pay | Admitting: *Deleted

## 2015-10-23 DIAGNOSIS — I69393 Ataxia following cerebral infarction: Secondary | ICD-10-CM | POA: Diagnosis not present

## 2015-10-23 DIAGNOSIS — I2510 Atherosclerotic heart disease of native coronary artery without angina pectoris: Secondary | ICD-10-CM | POA: Diagnosis not present

## 2015-10-23 DIAGNOSIS — G4733 Obstructive sleep apnea (adult) (pediatric): Secondary | ICD-10-CM | POA: Diagnosis not present

## 2015-10-23 DIAGNOSIS — F341 Dysthymic disorder: Secondary | ICD-10-CM | POA: Diagnosis not present

## 2015-10-23 DIAGNOSIS — E119 Type 2 diabetes mellitus without complications: Secondary | ICD-10-CM | POA: Diagnosis not present

## 2015-10-23 DIAGNOSIS — F4323 Adjustment disorder with mixed anxiety and depressed mood: Secondary | ICD-10-CM | POA: Diagnosis not present

## 2015-10-23 DIAGNOSIS — I10 Essential (primary) hypertension: Secondary | ICD-10-CM | POA: Diagnosis not present

## 2015-10-23 DIAGNOSIS — G8929 Other chronic pain: Secondary | ICD-10-CM | POA: Diagnosis not present

## 2015-10-23 DIAGNOSIS — M545 Low back pain: Secondary | ICD-10-CM | POA: Diagnosis not present

## 2015-10-23 NOTE — Patient Outreach (Signed)
[name]) Care Management  10/23/2015  Victor Wise 1951/01/06 [id number]  Transition of care call Mr.Victor Wise was discharged from [facility] on 6/7, DX: CVA.  Subjective: RNCM placed call to patient as part of the transition of care program, discussed purpose of call. Mr.Victor Wise reports that he has all of his medications and understands discharge instructions. Home Health has contacted patient and will begin services on today. Patient continues to use his cane. Mr.Victor Wise's concern today is regarding whether he will continue to stay at his daughters or return to his sister where he lived prior to admission to hospital. Mr.Victor Wise reports that he is temporarily staying at his daughters home instead of returning, to his sisters home as patient stated they had some words.   Patient states his daughter is  assisting him with communication and plans with his sister if he is to return to her home  Patient reports concern regarding not having his CBG meter or his CPAP, encouraged patient to ask for his daughters assistance with getting needed medical equipment to prevent further issues, he plans to do so today.,  Follow up: Mr.Victor Wise is aware of his follow up appointment with his PCP on 6/13 and patient to schedule appointment with the cardiologist that he has seen in the past [facility], and Neurologist [place].  Reviewed signs and symptoms of CVA, patient states he is aware.  Plan Patient will remain active with transition of care program, and receive weekly outreaches, RN will contact patient early in next week. RN collaborate with [facility] that has followed patient in the past regarding his current situation .Victor Wise Patient to notify MD of new or worsening symptoms.  THN CM Care Plan Problem One        Most Recent Value   Care Plan Problem One  Recent hospital admission   Role Documenting the Problem One  Care Management [place] for Problem One  Active   THN  Long Term Goal (31-90 days)  Patient will report no hospital admission in the next 31 days   THN Long Term Goal Start Date  10/23/15   Interventions for Problem One Long Term Goal  Educated on the transition of care program, with weekly ourtreaches, importance of PCP follow up, and notifying of worsening symptoms   THN CM Short Term Goal #1 (0-30 days)  Patient will obtain his needed medical supplies CPAP and blood sugar meter  from prior residence  in the next 3  days and begin to use as prescribed   THN CM Short Term Goal #1 Start Date  10/23/15   Interventions for Short Term Goal #1  Encouraged patient to ask  his daughter to assist with getting supplies, reinforced importance of  checking blood sugar and using CPAP as ordered   THN CM Short Term Goal #2 (0-30 days)  Patient will be able to state symptoms of stroke and action to take in the next 30 days   THN CM Short Term Goal #2 Start Date  10/23/15   Interventions for Short Term Goal #2  Educated on the signs of stroke, and immediate action to take.     Joylene Draft, RN, [place] Management [phone]- Mobile [phone]- Toll Free Main Office

## 2015-10-24 DIAGNOSIS — G4733 Obstructive sleep apnea (adult) (pediatric): Secondary | ICD-10-CM | POA: Diagnosis not present

## 2015-10-25 DIAGNOSIS — I69393 Ataxia following cerebral infarction: Secondary | ICD-10-CM | POA: Diagnosis not present

## 2015-10-25 DIAGNOSIS — I2510 Atherosclerotic heart disease of native coronary artery without angina pectoris: Secondary | ICD-10-CM | POA: Diagnosis not present

## 2015-10-25 DIAGNOSIS — E119 Type 2 diabetes mellitus without complications: Secondary | ICD-10-CM | POA: Diagnosis not present

## 2015-10-25 DIAGNOSIS — F341 Dysthymic disorder: Secondary | ICD-10-CM | POA: Diagnosis not present

## 2015-10-25 DIAGNOSIS — I10 Essential (primary) hypertension: Secondary | ICD-10-CM | POA: Diagnosis not present

## 2015-10-25 DIAGNOSIS — G8929 Other chronic pain: Secondary | ICD-10-CM | POA: Diagnosis not present

## 2015-10-25 DIAGNOSIS — G4733 Obstructive sleep apnea (adult) (pediatric): Secondary | ICD-10-CM | POA: Diagnosis not present

## 2015-10-25 DIAGNOSIS — F4323 Adjustment disorder with mixed anxiety and depressed mood: Secondary | ICD-10-CM | POA: Diagnosis not present

## 2015-10-25 DIAGNOSIS — M545 Low back pain: Secondary | ICD-10-CM | POA: Diagnosis not present

## 2015-10-26 ENCOUNTER — Emergency Department: Payer: Commercial Managed Care - HMO

## 2015-10-26 ENCOUNTER — Other Ambulatory Visit: Payer: Self-pay | Admitting: *Deleted

## 2015-10-26 ENCOUNTER — Inpatient Hospital Stay
Admission: EM | Admit: 2015-10-26 | Discharge: 2015-11-03 | DRG: 243 | Disposition: A | Discharge status: Skilled Nursing Facility | Payer: Commercial Managed Care - HMO | Attending: Internal Medicine | Admitting: Internal Medicine

## 2015-10-26 ENCOUNTER — Encounter: Payer: Self-pay | Admitting: Emergency Medicine

## 2015-10-26 DIAGNOSIS — Z89421 Acquired absence of other right toe(s): Secondary | ICD-10-CM

## 2015-10-26 DIAGNOSIS — E876 Hypokalemia: Secondary | ICD-10-CM | POA: Diagnosis present

## 2015-10-26 DIAGNOSIS — G8929 Other chronic pain: Secondary | ICD-10-CM | POA: Diagnosis present

## 2015-10-26 DIAGNOSIS — Z888 Allergy status to other drugs, medicaments and biological substances status: Secondary | ICD-10-CM

## 2015-10-26 DIAGNOSIS — M545 Low back pain: Secondary | ICD-10-CM | POA: Diagnosis not present

## 2015-10-26 DIAGNOSIS — Z950 Presence of cardiac pacemaker: Secondary | ICD-10-CM | POA: Diagnosis not present

## 2015-10-26 DIAGNOSIS — G464 Cerebellar stroke syndrome: Secondary | ICD-10-CM | POA: Diagnosis not present

## 2015-10-26 DIAGNOSIS — F329 Major depressive disorder, single episode, unspecified: Secondary | ICD-10-CM | POA: Diagnosis present

## 2015-10-26 DIAGNOSIS — E1165 Type 2 diabetes mellitus with hyperglycemia: Secondary | ICD-10-CM | POA: Diagnosis present

## 2015-10-26 DIAGNOSIS — Z794 Long term (current) use of insulin: Secondary | ICD-10-CM

## 2015-10-26 DIAGNOSIS — R55 Syncope and collapse: Secondary | ICD-10-CM | POA: Diagnosis present

## 2015-10-26 DIAGNOSIS — Z9889 Other specified postprocedural states: Secondary | ICD-10-CM | POA: Diagnosis not present

## 2015-10-26 DIAGNOSIS — Z7951 Long term (current) use of inhaled steroids: Secondary | ICD-10-CM | POA: Diagnosis not present

## 2015-10-26 DIAGNOSIS — G4733 Obstructive sleep apnea (adult) (pediatric): Secondary | ICD-10-CM | POA: Diagnosis present

## 2015-10-26 DIAGNOSIS — M6281 Muscle weakness (generalized): Secondary | ICD-10-CM | POA: Diagnosis not present

## 2015-10-26 DIAGNOSIS — I495 Sick sinus syndrome: Secondary | ICD-10-CM | POA: Diagnosis present

## 2015-10-26 DIAGNOSIS — R52 Pain, unspecified: Secondary | ICD-10-CM

## 2015-10-26 DIAGNOSIS — E782 Mixed hyperlipidemia: Secondary | ICD-10-CM | POA: Diagnosis present

## 2015-10-26 DIAGNOSIS — Z955 Presence of coronary angioplasty implant and graft: Secondary | ICD-10-CM | POA: Diagnosis not present

## 2015-10-26 DIAGNOSIS — E785 Hyperlipidemia, unspecified: Secondary | ICD-10-CM | POA: Diagnosis not present

## 2015-10-26 DIAGNOSIS — Z5189 Encounter for other specified aftercare: Secondary | ICD-10-CM | POA: Diagnosis not present

## 2015-10-26 DIAGNOSIS — Z79891 Long term (current) use of opiate analgesic: Secondary | ICD-10-CM

## 2015-10-26 DIAGNOSIS — I252 Old myocardial infarction: Secondary | ICD-10-CM | POA: Diagnosis not present

## 2015-10-26 DIAGNOSIS — I2510 Atherosclerotic heart disease of native coronary artery without angina pectoris: Secondary | ICD-10-CM | POA: Diagnosis present

## 2015-10-26 DIAGNOSIS — E119 Type 2 diabetes mellitus without complications: Secondary | ICD-10-CM | POA: Diagnosis not present

## 2015-10-26 DIAGNOSIS — Z841 Family history of disorders of kidney and ureter: Secondary | ICD-10-CM

## 2015-10-26 DIAGNOSIS — Z880 Allergy status to penicillin: Secondary | ICD-10-CM

## 2015-10-26 DIAGNOSIS — E669 Obesity, unspecified: Secondary | ICD-10-CM | POA: Diagnosis not present

## 2015-10-26 DIAGNOSIS — Z79899 Other long term (current) drug therapy: Secondary | ICD-10-CM | POA: Diagnosis not present

## 2015-10-26 DIAGNOSIS — F4323 Adjustment disorder with mixed anxiety and depressed mood: Secondary | ICD-10-CM | POA: Diagnosis not present

## 2015-10-26 DIAGNOSIS — I639 Cerebral infarction, unspecified: Secondary | ICD-10-CM | POA: Diagnosis not present

## 2015-10-26 DIAGNOSIS — Z8249 Family history of ischemic heart disease and other diseases of the circulatory system: Secondary | ICD-10-CM | POA: Diagnosis not present

## 2015-10-26 DIAGNOSIS — S8991XA Unspecified injury of right lower leg, initial encounter: Secondary | ICD-10-CM | POA: Diagnosis not present

## 2015-10-26 DIAGNOSIS — Z85828 Personal history of other malignant neoplasm of skin: Secondary | ICD-10-CM | POA: Diagnosis not present

## 2015-10-26 DIAGNOSIS — Z7401 Bed confinement status: Secondary | ICD-10-CM | POA: Diagnosis not present

## 2015-10-26 DIAGNOSIS — J449 Chronic obstructive pulmonary disease, unspecified: Secondary | ICD-10-CM | POA: Diagnosis present

## 2015-10-26 DIAGNOSIS — Z87891 Personal history of nicotine dependence: Secondary | ICD-10-CM

## 2015-10-26 DIAGNOSIS — L89629 Pressure ulcer of left heel, unspecified stage: Secondary | ICD-10-CM | POA: Diagnosis present

## 2015-10-26 DIAGNOSIS — T82599A Other mechanical complication of unspecified cardiac and vascular devices and implants, initial encounter: Secondary | ICD-10-CM | POA: Diagnosis not present

## 2015-10-26 DIAGNOSIS — I4589 Other specified conduction disorders: Secondary | ICD-10-CM | POA: Diagnosis present

## 2015-10-26 DIAGNOSIS — R001 Bradycardia, unspecified: Secondary | ICD-10-CM | POA: Diagnosis not present

## 2015-10-26 DIAGNOSIS — M549 Dorsalgia, unspecified: Secondary | ICD-10-CM | POA: Diagnosis present

## 2015-10-26 DIAGNOSIS — N400 Enlarged prostate without lower urinary tract symptoms: Secondary | ICD-10-CM | POA: Diagnosis present

## 2015-10-26 DIAGNOSIS — Z803 Family history of malignant neoplasm of breast: Secondary | ICD-10-CM

## 2015-10-26 DIAGNOSIS — I10 Essential (primary) hypertension: Secondary | ICD-10-CM | POA: Diagnosis present

## 2015-10-26 DIAGNOSIS — R262 Difficulty in walking, not elsewhere classified: Secondary | ICD-10-CM | POA: Diagnosis not present

## 2015-10-26 DIAGNOSIS — K219 Gastro-esophageal reflux disease without esophagitis: Secondary | ICD-10-CM | POA: Diagnosis present

## 2015-10-26 DIAGNOSIS — Z7982 Long term (current) use of aspirin: Secondary | ICD-10-CM | POA: Diagnosis not present

## 2015-10-26 DIAGNOSIS — I69393 Ataxia following cerebral infarction: Secondary | ICD-10-CM | POA: Diagnosis not present

## 2015-10-26 DIAGNOSIS — Z8673 Personal history of transient ischemic attack (TIA), and cerebral infarction without residual deficits: Secondary | ICD-10-CM | POA: Diagnosis not present

## 2015-10-26 DIAGNOSIS — M2040 Other hammer toe(s) (acquired), unspecified foot: Secondary | ICD-10-CM | POA: Diagnosis present

## 2015-10-26 DIAGNOSIS — Z882 Allergy status to sulfonamides status: Secondary | ICD-10-CM | POA: Diagnosis not present

## 2015-10-26 DIAGNOSIS — R569 Unspecified convulsions: Secondary | ICD-10-CM | POA: Diagnosis not present

## 2015-10-26 DIAGNOSIS — M25561 Pain in right knee: Secondary | ICD-10-CM | POA: Diagnosis not present

## 2015-10-26 DIAGNOSIS — Z801 Family history of malignant neoplasm of trachea, bronchus and lung: Secondary | ICD-10-CM

## 2015-10-26 DIAGNOSIS — Z6841 Body Mass Index (BMI) 40.0 and over, adult: Secondary | ICD-10-CM | POA: Diagnosis not present

## 2015-10-26 DIAGNOSIS — F341 Dysthymic disorder: Secondary | ICD-10-CM | POA: Diagnosis not present

## 2015-10-26 DIAGNOSIS — W19XXXA Unspecified fall, initial encounter: Secondary | ICD-10-CM

## 2015-10-26 LAB — COMPREHENSIVE METABOLIC PANEL
ALBUMIN: 4.2 g/dL (ref 3.5–5.0)
ALT: 20 U/L (ref 17–63)
AST: 21 U/L (ref 15–41)
Alkaline Phosphatase: 54 U/L (ref 38–126)
Anion gap: 10 (ref 5–15)
BUN: 25 mg/dL — AB (ref 6–20)
CHLORIDE: 102 mmol/L (ref 101–111)
CO2: 25 mmol/L (ref 22–32)
Calcium: 9.2 mg/dL (ref 8.9–10.3)
Creatinine, Ser: 1.33 mg/dL — ABNORMAL HIGH (ref 0.61–1.24)
GFR calc Af Amer: >60 mL/min (ref >60)
GFR, EST NON AFRICAN AMERICAN: 55 mL/min — AB (ref >60)
GLUCOSE: 267 mg/dL — AB (ref 65–99)
POTASSIUM: 3.7 mmol/L (ref 3.5–5.1)
SODIUM: 137 mmol/L (ref 135–145)
Total Bilirubin: 0.6 mg/dL (ref 0.3–1.2)
Total Protein: 6.9 g/dL (ref 6.5–8.1)

## 2015-10-26 LAB — CBC
HEMATOCRIT: 37.4 % — AB (ref 40.0–52.0)
HEMOGLOBIN: 12.2 g/dL — AB (ref 13.0–18.0)
MCH: 25.5 pg — AB (ref 26.0–34.0)
MCHC: 32.6 g/dL (ref 32.0–36.0)
MCV: 78.2 fL — AB (ref 80.0–100.0)
PLATELETS: 186 10*3/uL (ref 150–440)
RBC: 4.78 MIL/uL (ref 4.40–5.90)
RDW: 18.5 % — ABNORMAL HIGH (ref 11.5–14.5)
WBC: 6 10*3/uL (ref 3.8–10.6)

## 2015-10-26 LAB — TROPONIN I

## 2015-10-26 LAB — BRAIN NATRIURETIC PEPTIDE: B Natriuretic Peptide: 19 pg/mL (ref 0.0–100.0)

## 2015-10-26 LAB — GLUCOSE, CAPILLARY
GLUCOSE-CAPILLARY: 205 mg/dL — AB (ref 65–99)
GLUCOSE-CAPILLARY: 226 mg/dL — AB (ref 65–99)

## 2015-10-26 MED ORDER — INSULIN GLARGINE 100 UNIT/ML ~~LOC~~ SOLN
60.0000 [IU] | Freq: Every day | SUBCUTANEOUS | Status: DC
  Administered 2015-10-26 – 2015-10-31 (×6): 60 [IU] via SUBCUTANEOUS
  Filled 2015-10-26 (×7): qty 0.6

## 2015-10-26 MED ORDER — OXYCODONE HCL ER 10 MG PO T12A
20.0000 mg | EXTENDED_RELEASE_TABLET | Freq: Two times a day (BID) | ORAL | Status: DC
  Administered 2015-10-26 – 2015-11-03 (×16): 20 mg via ORAL
  Filled 2015-10-26 (×16): qty 2

## 2015-10-26 MED ORDER — INSULIN ASPART 100 UNIT/ML ~~LOC~~ SOLN
SUBCUTANEOUS | Status: AC
  Administered 2015-10-26: 3 [IU] via SUBCUTANEOUS
  Filled 2015-10-26: qty 3

## 2015-10-26 MED ORDER — OXCARBAZEPINE 300 MG PO TABS: 300.0000 mg | ORAL_TABLET | Freq: Two times a day (BID) | ORAL | Status: DC

## 2015-10-26 MED ORDER — SODIUM CHLORIDE 0.9% FLUSH
3.0000 mL | Freq: Two times a day (BID) | INTRAVENOUS | Status: DC
  Administered 2015-10-26 – 2015-11-03 (×13): 3 mL via INTRAVENOUS

## 2015-10-26 MED ORDER — DULOXETINE HCL 30 MG PO CPEP
30.0000 mg | ORAL_CAPSULE | Freq: Every day | ORAL | Status: DC
  Administered 2015-10-26 – 2015-11-02 (×8): 30 mg via ORAL
  Filled 2015-10-26 (×7): qty 1

## 2015-10-26 MED ORDER — FLUTICASONE PROPIONATE 50 MCG/ACT NA SUSP
2.0000 | Freq: Every day | NASAL | Status: DC
  Administered 2015-10-27 – 2015-11-03 (×7): 2 via NASAL
  Filled 2015-10-26: qty 16

## 2015-10-26 MED ORDER — OXCARBAZEPINE 300 MG PO TABS
600.0000 mg | ORAL_TABLET | Freq: Every day | ORAL | Status: DC
  Administered 2015-10-26 – 2015-11-02 (×8): 600 mg via ORAL
  Filled 2015-10-26 (×8): qty 2

## 2015-10-26 MED ORDER — METAXALONE 800 MG PO TABS
400.0000 mg | ORAL_TABLET | Freq: Three times a day (TID) | ORAL | Status: DC | PRN
  Filled 2015-10-26: qty 1

## 2015-10-26 MED ORDER — PREGABALIN 200 MG PO CAPS: 200.0000 mg | ORAL_CAPSULE | Freq: Two times a day (BID) | ORAL | Status: DC

## 2015-10-26 MED ORDER — OMEGA-3-ACID ETHYL ESTERS 1 G PO CAPS
1.0000 g | ORAL_CAPSULE | Freq: Two times a day (BID) | ORAL | Status: DC
  Administered 2015-10-26 – 2015-11-03 (×15): 1 g via ORAL
  Filled 2015-10-26 (×16): qty 1

## 2015-10-26 MED ORDER — INSULIN ASPART 100 UNIT/ML ~~LOC~~ SOLN
0.0000 [IU] | Freq: Three times a day (TID) | SUBCUTANEOUS | Status: DC
  Administered 2015-10-26: 3 [IU] via SUBCUTANEOUS
  Administered 2015-10-27: 7 [IU] via SUBCUTANEOUS
  Administered 2015-10-27: 2 [IU] via SUBCUTANEOUS
  Filled 2015-10-26: qty 2
  Filled 2015-10-26: qty 7

## 2015-10-26 MED ORDER — OXCARBAZEPINE 300 MG PO TABS
300.0000 mg | ORAL_TABLET | Freq: Every day | ORAL | Status: DC
  Administered 2015-10-27 – 2015-11-03 (×8): 300 mg via ORAL
  Filled 2015-10-26 (×9): qty 1

## 2015-10-26 MED ORDER — OXYCODONE HCL 5 MG PO TABS: 5.0000 mg | ORAL_TABLET | Freq: Three times a day (TID) | ORAL | Status: DC | PRN

## 2015-10-26 MED ORDER — CLOPIDOGREL BISULFATE 75 MG PO TABS
75.0000 mg | ORAL_TABLET | Freq: Every day | ORAL | Status: DC
  Administered 2015-10-27: 75 mg via ORAL
  Filled 2015-10-26: qty 1

## 2015-10-26 MED ORDER — DULOXETINE HCL 30 MG PO CPEP
60.0000 mg | ORAL_CAPSULE | Freq: Every day | ORAL | Status: DC
  Administered 2015-10-27 – 2015-11-03 (×8): 60 mg via ORAL
  Filled 2015-10-26 (×9): qty 2

## 2015-10-26 MED ORDER — BUSPIRONE HCL 10 MG PO TABS
10.0000 mg | ORAL_TABLET | Freq: Every day | ORAL | Status: DC
Start: 2015-10-26 — End: 2015-11-03
  Administered 2015-10-26 – 2015-11-02 (×8): 10 mg via ORAL
  Filled 2015-10-26 (×8): qty 1

## 2015-10-26 MED ORDER — PREGABALIN 75 MG PO CAPS
200.0000 mg | ORAL_CAPSULE | Freq: Every day | ORAL | Status: DC
  Administered 2015-10-26 – 2015-11-02 (×8): 200 mg via ORAL
  Filled 2015-10-26 (×8): qty 1

## 2015-10-26 MED ORDER — LORATADINE 10 MG PO TABS
10.0000 mg | ORAL_TABLET | Freq: Every day | ORAL | Status: DC
  Administered 2015-10-27 – 2015-11-03 (×8): 10 mg via ORAL
  Filled 2015-10-26 (×8): qty 1

## 2015-10-26 MED ORDER — PANTOPRAZOLE SODIUM 40 MG PO TBEC
40.0000 mg | DELAYED_RELEASE_TABLET | Freq: Every day | ORAL | Status: DC
  Administered 2015-10-27 – 2015-11-03 (×8): 40 mg via ORAL
  Filled 2015-10-26 (×8): qty 1

## 2015-10-26 MED ORDER — FENOFIBRATE 160 MG PO TABS
160.0000 mg | ORAL_TABLET | Freq: Every evening | ORAL | Status: DC
  Administered 2015-10-26 – 2015-11-02 (×8): 160 mg via ORAL
  Filled 2015-10-26 (×8): qty 1

## 2015-10-26 MED ORDER — PREGABALIN 75 MG PO CAPS
400.0000 mg | ORAL_CAPSULE | Freq: Every day | ORAL | Status: DC
  Administered 2015-10-27 – 2015-10-29 (×3): 400 mg via ORAL
  Filled 2015-10-26 (×2): qty 5
  Filled 2015-10-26: qty 1
  Filled 2015-10-26: qty 5

## 2015-10-26 MED ORDER — DULOXETINE HCL 30 MG PO CPEP: 30.0000 mg | ORAL_CAPSULE | Freq: Two times a day (BID) | ORAL | Status: DC

## 2015-10-26 MED ORDER — BUPROPION HCL ER (XL) 150 MG PO TB24
300.0000 mg | ORAL_TABLET | Freq: Every day | ORAL | Status: DC
  Administered 2015-10-27 – 2015-11-03 (×8): 300 mg via ORAL
  Filled 2015-10-26 (×8): qty 2

## 2015-10-26 MED ORDER — TAMSULOSIN HCL 0.4 MG PO CAPS
0.4000 mg | ORAL_CAPSULE | Freq: Every day | ORAL | Status: DC
  Administered 2015-10-27 – 2015-11-02 (×7): 0.4 mg via ORAL
  Filled 2015-10-26 (×7): qty 1

## 2015-10-26 MED ORDER — ASPIRIN EC 81 MG PO TBEC
81.0000 mg | DELAYED_RELEASE_TABLET | Freq: Every day | ORAL | Status: DC
  Administered 2015-10-27 – 2015-11-03 (×8): 81 mg via ORAL
  Filled 2015-10-26 (×8): qty 1

## 2015-10-26 MED ORDER — HEPARIN SODIUM (PORCINE) 5000 UNIT/ML IJ SOLN
5000.0000 [IU] | Freq: Three times a day (TID) | INTRAMUSCULAR | Status: DC
  Administered 2015-10-26 – 2015-10-27 (×3): 5000 [IU] via SUBCUTANEOUS
  Filled 2015-10-26 (×3): qty 1

## 2015-10-26 MED ORDER — EXENATIDE ER 2 MG ~~LOC~~ PEN: 2.0000 mg | PEN_INJECTOR | SUBCUTANEOUS | Status: DC

## 2015-10-26 NOTE — H&P (Signed)
[place] [facility] [place] NAME: Victor Wise    MR#:  [id number]  DATE OF BIRTH:  Feb 12, 1951  DATE OF ADMISSION:  10/26/2015  PRIMARY CARE PHYSICIAN: Lavera Guise, MD   REQUESTING/REFERRING PHYSICIAN: [place]  CHIEF COMPLAINT:   Chief Complaint  Patient presents with  . Loss of Consciousness    HISTORY OF PRESENT ILLNESS: Victor Wise  is a 65 y.o. male with a known history of IV disease, hypertension, benign prostatic hypertrophy, morbid obesity, chronic back pain, depression, CAD, stroke, basal cell carcinoma- was admitted to hospital 6 days ago for her stroke and after workup discharged to home. He was feeling fine and at his baseline up until yesterday. Today he was trying to get to his car from his home and he had a syncopal episode he just passed out he denies any preceding feelings of palpitation or shortness of breath or feeling any numbness or focal weakness when he re-changes consciousness. He was alone at that time and he passed out in his porch in his front yard so he does not know how long he was on the ground but somebody saw him and they called EMT, and his daughter who also lives in the house was outside she also arrived at same time. As per the ER physician's report, EMS found his blood pressure was running in [id number] systolic, and so they brought him to the emergency room. Patient confirms his compliance with all his medications as prescribed.  PAST MEDICAL HISTORY:   Past Medical History  Diagnosis Date  . Diabetes ([place])   . Hypertension   . BPH (benign prostatic hyperplasia)   . 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 Morbid obesity ([place])   . Obstructive sleep apnea   . Chronic back pain   . Depression   . UTI (lower urinary tract infection)   . Status post insertion of spinal cord stimulator   . Heart attack ([place])   . Stroke ([place])   . Basal cell carcinoma     forehead  . GERD (gastroesophageal reflux disease)   . Allergy     PAST  SURGICAL HISTORY: Past Surgical History  Procedure Laterality Date  . Pain stimulator    . Right toe amputation    . Cardiac catheterization N/A 12/19/2014    Procedure: Coronary Stent Intervention;  Surgeon: Charolette Forward, MD;  Location: [place] [facility];  Service: Cardiovascular;  Laterality: N/A;  . Cardiac catheterization Left 12/19/2014    Procedure: Left Heart Cath and Coronary Angiography;  Surgeon: Dionisio Derrich, MD;  Location: [facility];  Service: Cardiovascular;  Laterality: Left;    SOCIAL HISTORY:  Social History  Substance Use Topics  . Smoking status: Former Smoker    Types: Cigarettes  . Smokeless tobacco: Not on file     Comment: quit 45 years  . Alcohol Use: 0.0 oz/week    0 Standard drinks or equivalent per week     Comment: occasionally    FAMILY HISTORY:  Family History  Problem Relation Age of Onset  . Breast cancer Mother   . Cancer Mother   . Hypertension Mother   . Lung cancer Father   . Hypertension Father   . Heart disease Father   . Cancer Father   . Kidney disease Sister   . Prostate cancer Neg Hx     DRUG ALLERGIES:  Allergies  Allergen Reactions  . Amoxicillin Shortness Of Breath, Itching and Other (  See Comments)    Has patient had a PCN reaction causing immediate rash, facial/tongue/throat swelling, SOB or lightheadedness with hypotension: Yes Has patient had a PCN reaction causing severe rash involving mucus membranes or skin necrosis: No Has patient had a PCN reaction that required hospitalization No Has patient had a PCN reaction occurring within the last 10 years: No If all of the above answers are "NO", then may proceed with Cephalosporin use.  . Other Anaphylaxis, Itching and Other (See Comments)    Pt states that he is allergic to Endopa.  Reaction:  Anaphylaxis  Pt states that he is allergic to Metabisulfites Reaction:  Itching   . Penicillins Shortness Of Breath, Itching and Other (See Comments)    Has patient had a  PCN reaction causing immediate rash, facial/tongue/throat swelling, SOB or lightheadedness with hypotension: Yes Has patient had a PCN reaction causing severe rash involving mucus membranes or skin necrosis: No Has patient had a PCN reaction that required hospitalization No Has patient had a PCN reaction occurring within the last 10 years: No If all of the above answers are "NO", then may proceed with Cephalosporin use.  [name] Hydralazine Other (See Comments)    Reaction:  Cramping of extremities  . Crestor [Rosuvastatin Calcium] Other (See Comments)    Reaction:  Pt is unable to move arms/legs   . Metoprolol Nausea And Vomiting  . Statins Other (See Comments)    Reaction:  Pt is unable to move arms/legs  . Sulfa Antibiotics Itching  . Yellow Dyes (Non-Tartrazine) Itching  . Aspirin Itching and Other (See Comments)    Pt states that he is able to use in lower doses.    . Tape Rash    REVIEW OF SYSTEMS:   CONSTITUTIONAL: No fever, fatigue or weakness.  EYES: No blurred or double vision.  EARS, NOSE, AND THROAT: No tinnitus or ear pain.  RESPIRATORY: No cough, shortness of breath, wheezing or hemoptysis.  CARDIOVASCULAR: No chest pain, orthopnea, edema.  GASTROINTESTINAL: No nausea, vomiting, diarrhea or abdominal pain.  GENITOURINARY: No dysuria, hematuria.  ENDOCRINE: No polyuria, nocturia,  HEMATOLOGY: No anemia, easy bruising or bleeding SKIN: No rash or lesion. MUSCULOSKELETAL: No joint pain or arthritis.   NEUROLOGIC: No tingling, numbness, weakness.  PSYCHIATRY: No anxiety or depression.   MEDICATIONS AT HOME:  Prior to Admission medications   Medication Sig Start Date End Date Taking? Authorizing Provider  amLODipine (NORVASC) 10 MG tablet Take 10 mg by mouth daily.   Yes Historical Provider, MD  aspirin EC 81 MG tablet Take 81 mg by mouth daily.   Yes Historical Provider, MD  benazepril (LOTENSIN) 40 MG tablet Take 40 mg by mouth 2 (two) times daily.    Yes Historical  Provider, MD  buPROPion (WELLBUTRIN XL) 150 MG 24 hr tablet Take 300 mg by mouth daily.    Yes Historical Provider, MD  busPIRone (BUSPAR) 10 MG tablet Take 10 mg by mouth at bedtime.   Yes Historical Provider, MD  cetirizine (ZYRTEC) 10 MG tablet Take 10 mg by mouth at bedtime as needed for allergies.    Yes Historical Provider, MD  clopidogrel (PLAVIX) 75 MG tablet Take 1 tablet (75 mg total) by mouth daily. 10/21/15  Yes Nicholes Mango, MD  DULoxetine (CYMBALTA) 30 MG capsule Take 30-60 mg by mouth 2 (two) times daily. Pt takes two capsules in the morning and one at night.   Yes Historical Provider, MD  Exenatide ER (BYDUREON) 2 MG PEN Inject 2 mg  into the skin once a week. Pt uses on Sunday.   Yes Historical Provider, MD  fenofibrate 160 MG tablet Take 1 tablet (160 mg total) by mouth every evening. 10/21/15  Yes Nicholes Mango, MD  fluticasone (FLONASE) 50 MCG/ACT nasal spray Place 2 sprays into both nostrils daily.   Yes Historical Provider, MD  hydrochlorothiazide (MICROZIDE) 12.5 MG capsule Take 12.5 mg by mouth daily.   Yes Historical Provider, MD  insulin glargine (LANTUS) 100 UNIT/ML injection Inject 60 Units into the skin at bedtime.   Yes Historical Provider, MD  insulin regular (NOVOLIN R,HUMULIN R) 100 units/mL injection Inject 0-15 Units into the skin 3 (three) times daily with meals as needed for high blood sugar. Pt uses as needed per sliding scale.   Yes Historical Provider, MD  metaxalone (SKELAXIN) 800 MG tablet Take 400-800 mg by mouth 3 (three) times daily as needed for muscle spasms.   Yes Historical Provider, MD  NARCAN 4 MG/0.1ML LIQD Take 4 mg by mouth as needed (for opioid overdose).    Yes Historical Provider, MD  nitroGLYCERIN (NITROSTAT) 0.4 MG SL tablet Place 1 tablet (0.4 mg total) under the tongue every 5 (five) minutes x 3 doses as needed for chest pain. 12/21/14  Yes Dixie Dials, MD  omega-3 acid ethyl esters (LOVAZA) 1 g capsule Take 1 capsule (1 g total) by mouth 2 (two)  times daily. 10/21/15  Yes Nicholes Mango, MD  Oxcarbazepine (TRILEPTAL) 300 MG tablet Take 300-600 mg by mouth 2 (two) times daily. Pt takes one tablet in the morning and two at night.   Yes Historical Provider, MD  oxyCODONE (OXY IR/ROXICODONE) 5 MG immediate release tablet Take 5 mg by mouth 3 (three) times daily as needed for severe pain.    Yes Historical Provider, MD  OxyCODONE (OXYCONTIN) 20 mg T12A 12 hr tablet Take 20 mg by mouth every 12 (twelve) hours.    Yes Historical Provider, MD  pantoprazole (PROTONIX) 40 MG tablet Take 40 mg by mouth daily.   Yes Historical Provider, MD  pregabalin (LYRICA) 200 MG capsule Take 200-400 mg by mouth 2 (two) times daily. Pt takes two capsules in the morning and one at night.   Yes Historical Provider, MD  tamsulosin (FLOMAX) 0.4 MG CAPS capsule Take 0.4 mg by mouth daily after supper.    Yes Historical Provider, MD      PHYSICAL EXAMINATION:   VITAL SIGNS: Blood pressure 150/70, pulse 56, temperature 97.7 F (36.5 C), temperature source Oral, resp. rate 18, weight 133.811 kg (295 lb), SpO2 95 %.  GENERAL:  65 y.o.-year-old Obese patient lying in the bed with no acute distress.  EYES: Pupils equal, round, reactive to light and accommodation. No scleral icterus. Extraocular muscles intact.  HEENT: Head atraumatic, normocephalic. Oropharynx and nasopharynx clear.  NECK:  Supple, no jugular venous distention. No thyroid enlargement, no tenderness.  LUNGS: Normal breath sounds bilaterally, no wheezing, rales,rhonchi or crepitation. No use of accessory muscles of respiration.  CARDIOVASCULAR: S1, S2 normal. No murmurs, rubs, or gallops.  ABDOMEN: Soft, nontender, nondistended. Bowel sounds present. No organomegaly or mass.  EXTREMITIES: No pedal edema, cyanosis, or clubbing. Surgical scar present on the right leg. NEUROLOGIC: Cranial nerves II through XII are intact. Muscle strength 5/5 in all extremities. Sensation intact. Gait not checked.  PSYCHIATRIC:  The patient is alert and oriented x 3.  SKIN: No obvious rash, lesion, or ulcer.   LABORATORY PANEL:   CBC  Recent Labs Lab 10/20/15 0408 10/26/15  1533  WBC 5.0 6.0  HGB 12.0* 12.2*  HCT 36.9* 37.4*  PLT 197 186  MCV 79.2* 78.2*  MCH 25.7* 25.5*  MCHC 32.5 32.6  RDW 18.5* 18.5*   ------------------------------------------------------------------------------------------------------------------  Chemistries   Recent Labs Lab 10/20/15 0408 10/26/15 1533  NA 141 137  K 3.5 3.7  CL 106 102  CO2 28 25  GLUCOSE 168* 267*  BUN 24* 25*  CREATININE 1.19 1.33*  CALCIUM 8.8* 9.2  AST  --  21  ALT  --  20  ALKPHOS  --  54  BILITOT  --  0.6   ------------------------------------------------------------------------------------------------------------------ estimated creatinine clearance is 75.6 mL/min (by C-G formula based on Cr of 1.33). ------------------------------------------------------------------------------------------------------------------ No results for input(s): TSH, T4TOTAL, T3FREE, THYROIDAB in the last 72 hours.  Invalid input(s): FREET3   Coagulation profile No results for input(s): INR, PROTIME in the last 168 hours. ------------------------------------------------------------------------------------------------------------------- No results for input(s): DDIMER in the last 72 hours. -------------------------------------------------------------------------------------------------------------------  Cardiac Enzymes  Recent Labs Lab 10/26/15 1533  TROPONINI <0.03   ------------------------------------------------------------------------------------------------------------------ Invalid input(s): POCBNP  ---------------------------------------------------------------------------------------------------------------  Urinalysis    Component Value Date/Time   COLORURINE YELLOW* 10/19/2015 2312   COLORURINE Straw 02/06/2014 2120   APPEARANCEUR  CLEAR* 10/19/2015 2312   APPEARANCEUR Clear 02/06/2014 2120   LABSPEC 1.021 10/19/2015 2312   LABSPEC 1.012 02/06/2014 2120   PHURINE 6.0 10/19/2015 2312   PHURINE 5.0 02/06/2014 2120   GLUCOSEU 50* 10/19/2015 2312   GLUCOSEU Negative 02/06/2014 2120   HGBUR NEGATIVE 10/19/2015 2312   HGBUR Negative 02/06/2014 2120   BILIRUBINUR NEGATIVE 10/19/2015 2312   BILIRUBINUR Negative 02/06/2014 2120   KETONESUR NEGATIVE 10/19/2015 2312   KETONESUR Negative 02/06/2014 2120   PROTEINUR NEGATIVE 10/19/2015 2312   PROTEINUR Negative 02/06/2014 2120   NITRITE NEGATIVE 10/19/2015 2312   NITRITE Negative 02/06/2014 2120   LEUKOCYTESUR NEGATIVE 10/19/2015 2312   LEUKOCYTESUR Negative 02/06/2014 2120     RADIOLOGY: Dg Knee 1-2 Views Right  10/26/2015  CLINICAL DATA:  65 year old male with syncope and fall. Posterior right knee pain. Initial encounter. EXAM: RIGHT KNEE - 1-2 VIEW COMPARISON:  Right femur series  10/21/2015 FINDINGS: Long segment vascular stent extending from the right mid thigh to the popliteal fossa re- demonstrated and appears stable in configuration. Sip there tricompartmental degenerative changes at the right knee with a space loss and bulky osteophytosis. No definite joint effusion. No acute fracture or dislocation identified about the right knee. IMPRESSION: 1. No acute fracture or dislocation identified about the right knee. 2. Severe tricompartmental degenerative changes. 3. Long segment right lower extremity vascular stent appears stable. Electronically Signed   By: Genevie Ann M.D.   On: 10/26/2015 16:56   Ct Head Wo Contrast  10/26/2015  CLINICAL DATA:  Recent syncopal episode with posterior head injury, initial encounter EXAM: CT HEAD WITHOUT CONTRAST TECHNIQUE: Contiguous axial images were obtained from the base of the skull through the vertex without intravenous contrast. COMPARISON:  10/19/2015 FINDINGS: Bony calvarium is intact. Mild atrophic changes are noted. There is again  seen an area of decreased attenuation within the deep white matter or/basal ganglia on the left somewhat smaller than that seen on the prior exam consistent with prior infarct. No findings to suggest acute hemorrhage, acute infarction or space-occupying mass lesion are noted. IMPRESSION: Changes consistent with prior lacunar infarct on the left as described. No acute abnormality is noted. Electronically Signed   By: Inez Catalina M.D.   On: 10/26/2015 16:53   Dg Chest Portable  1 View  10/26/2015  CLINICAL DATA:  65 year old male with syncope. Initial encounter. EXAM: PORTABLE CHEST 1 VIEW COMPARISON:  12/18/2014 and earlier. FINDINGS: Seated AP view of the chest. Large body habitus. Allowing for AP technique cardiac and mediastinal contours are within normal limits. Visualized tracheal air column is within normal limits. No pneumothorax, pulmonary edema, pleural effusion or confluent pulmonary opacity. IMPRESSION: No acute cardiopulmonary abnormality. Electronically Signed   By: Genevie Ann M.D.   On: 10/26/2015 16:54    EKG: Orders placed or performed during the hospital encounter of 10/26/15  . EKG 12-Lead  . EKG 12-Lead    IMPRESSION AND PLAN:  * Syncopal episode  Patient recently had echocardiogram done this week, without any significant findings of valvular disease.   MRI cannot be done because of his spinal stimulator   We'll monitor on telemetry and check his orthostatic vitals.  His urinalysis is also not done yet so I will send that.  * Stroke   Continue home medications.  * Diabetes  Continue long-acting initially in, keep on insulin sliding scale coverage.  * Chronic pain    Continue his home dose of oxycodone.   All the records are reviewed and case discussed with ED provider. Management plans discussed with the patient, family and they are in agreement.  CODE STATUS:Full code  Code Status History    Date Active Date Inactive Code Status Order ID Comments User Context    10/19/2015 10:00 PM 10/21/2015 11:51 PM Full Code [id number]  Gladstone Lighter, MD Inpatient   12/19/2014  9:31 PM 12/21/2014  5:10 PM Full Code [id number]  Charolette Forward, MD Inpatient   12/19/2014  9:23 PM 12/19/2014  9:31 PM Full Code [id number]  Charolette Forward, MD Inpatient   12/19/2014 12:38 PM 12/19/2014  5:18 PM Full Code [id number]  Dionisio Jakin, MD Inpatient   12/18/2014  6:36 PM 12/19/2014 12:38 PM Full Code [id number]  Henreitta Leber, MD Inpatient       TOTAL TIME TAKING CARE OF THIS PATIENT: 50  minutes.    Vaughan Basta M.D on 10/26/2015   Between 7am to 6pm - Pager - [phone]  After 6pm go to [email or web address] - password [email or web address] [facility]  Office  [phone]  CC: Primary care physician; Lavera Guise, MD   Note: This dictation was prepared with Dragon dictation along with smaller phrase technology. Any transcriptional errors that result from this process are unintentional.

## 2015-10-26 NOTE — Patient Outreach (Signed)
[facility]) Care Management  10/26/2015  Victor Wise 07-18-1950 [id number]   Transition of care note   RNCM placed call to patient Subjective: 1212 Patient states  that he is trying, when asked how he was doing he discussed home health services is following him. Patient is currently at his daughter's home and states social worker is with him now. Patient requested I call him back later today.  [place] Returned call to patient as requested he informed me that he was in an ambulance on his way to [place] Will follow patient progress, discuss with [facility] hospital liaison to follow up.   Joylene Draft, RN, [place] Management [phone]- Mobile [phone]- Toll Free Main Office

## 2015-10-26 NOTE — ED Notes (Signed)
POC CBG results at 817pm= 205 mg/dL

## 2015-10-26 NOTE — ED Provider Notes (Signed)
[facility] Emergency Department Provider Note  ____________________________________________  Time seen: Approximately 3:40 PM  I have reviewed the triage vital signs and the nursing notes.   HISTORY  Chief Complaint Loss of Consciousness    HPI Victor Wise is a 65 y.o. male history of diabetes, obesity, sleep apnea, chronic back pain, previous heart attack, and a recent ischemic stroke.  The patient reports that this afternoon he was walking in the lawn, when he just suddenly woke up on the ground. Evidently he had a witnessed syncopal episode. He is found to be diaphoretic, hypotensive to the 80s by EMS. He complains of no pain or injury. And he now reports feeling better. He denies having any preceding chest pain, or symptoms afterwards other than feeling lightheaded and weak. He denies any new weakness namely no facial droop, slurred speech, weakness in any arm or leg.  No nausea or vomiting. Reports that this has not happened to him before.  He is notably taking Plavix.  Past Medical History  Diagnosis Date  . Diabetes ([place])   . Hypertension   . BPH (benign prostatic hyperplasia)   . 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 Morbid obesity ([place])   . Obstructive sleep apnea   . Chronic back pain   . Depression   . UTI (lower urinary tract infection)   . Status post insertion of spinal cord stimulator   . Heart attack ([place])   . Stroke ([place])   . Basal cell carcinoma     forehead  . GERD (gastroesophageal reflux disease)   . Allergy     Patient Active Problem List   Diagnosis Date Noted  . Adjustment disorder with mixed anxiety and depressed mood 10/20/2015  . Dysthymia 10/20/2015  . CVA (cerebral infarction) 10/19/2015  . Urinary retention 03/25/2015  . 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 Acute MI, anterolateral wall, subsequent episode of care ([place]) 12/19/2014  . SIRS (systemic inflammatory response syndrome) ([place]) 12/18/2014    Past Surgical  History  Procedure Laterality Date  . Pain stimulator    . Right toe amputation    . Cardiac catheterization N/A 12/19/2014    Procedure: Coronary Stent Intervention;  Surgeon: Charolette Forward, MD;  Location: [place] [facility];  Service: Cardiovascular;  Laterality: N/A;  . Cardiac catheterization Left 12/19/2014    Procedure: Left Heart Cath and Coronary Angiography;  Surgeon: Dionisio Jese, MD;  Location: [place] [facility];  Service: Cardiovascular;  Laterality: Left;    Current Outpatient Rx  Name  Route  Sig  Dispense  Refill  . amLODipine (NORVASC) 10 MG tablet   Oral   Take 10 mg by mouth daily.         [name] aspirin EC 81 MG tablet   Oral   Take 81 mg by mouth daily.         . benazepril (LOTENSIN) 40 MG tablet   Oral   Take 40 mg by mouth 2 (two) times daily.          [name] buPROPion (WELLBUTRIN XL) 150 MG 24 hr tablet   Oral   Take 300 mg by mouth daily.          . busPIRone (BUSPAR) 10 MG tablet   Oral   Take 10 mg by mouth at bedtime.         . cetirizine (ZYRTEC) 10 MG tablet   Oral   Take 10 mg by mouth at bedtime as needed for  allergies.          [name] clopidogrel (PLAVIX) 75 MG tablet   Oral   Take 1 tablet (75 mg total) by mouth daily.   30 tablet   0   . DULoxetine (CYMBALTA) 30 MG capsule   Oral   Take 30-60 mg by mouth 2 (two) times daily. Pt takes two capsules in the morning and one at night.         . Exenatide ER (BYDUREON) 2 MG PEN   Subcutaneous   Inject 2 mg into the skin once a week. Pt uses on Sunday.         . fenofibrate 160 MG tablet   Oral   Take 1 tablet (160 mg total) by mouth every evening.   30 tablet   0   . fluticasone (FLONASE) 50 MCG/ACT nasal spray   Each Nare   Place 2 sprays into both nostrils daily.         . hydrochlorothiazide (MICROZIDE) 12.5 MG capsule   Oral   Take 12.5 mg by mouth daily.         . insulin glargine (LANTUS) 100 UNIT/ML injection   Subcutaneous   Inject 60 Units into the skin at  bedtime.         . insulin regular (NOVOLIN R,HUMULIN R) 100 units/mL injection   Subcutaneous   Inject 0-15 Units into the skin 3 (three) times daily with meals as needed for high blood sugar. Pt uses as needed per sliding scale.         . metaxalone (SKELAXIN) 800 MG tablet   Oral   Take 400-800 mg by mouth 3 (three) times daily as needed for muscle spasms.         [name] NARCAN 4 MG/0.1ML LIQD   Oral   Take 4 mg by mouth as needed (for opioid overdose).            Dispense as written.   . nitroGLYCERIN (NITROSTAT) 0.4 MG SL tablet   Sublingual   Place 1 tablet (0.4 mg total) under the tongue every 5 (five) minutes x 3 doses as needed for chest pain.   25 tablet   1   . omega-3 acid ethyl esters (LOVAZA) 1 g capsule   Oral   Take 1 capsule (1 g total) by mouth 2 (two) times daily.   60 capsule   0   . Oxcarbazepine (TRILEPTAL) 300 MG tablet   Oral   Take 300-600 mg by mouth 2 (two) times daily. Pt takes one tablet in the morning and two at night.         [name] oxyCODONE (OXY IR/ROXICODONE) 5 MG immediate release tablet   Oral   Take 5 mg by mouth 3 (three) times daily as needed for severe pain.          . OxyCODONE (OXYCONTIN) 20 mg T12A 12 hr tablet   Oral   Take 20 mg by mouth every 12 (twelve) hours.          . pantoprazole (PROTONIX) 40 MG tablet   Oral   Take 40 mg by mouth daily.         . pregabalin (LYRICA) 200 MG capsule   Oral   Take 200-400 mg by mouth 2 (two) times daily. Pt takes two capsules in the morning and one at night.         . tamsulosin (FLOMAX) 0.4 MG CAPS capsule   Oral   Take  0.4 mg by mouth daily after supper.            Allergies Amoxicillin; Other; Penicillins; Hydralazine; Crestor; Metoprolol; Statins; Sulfa antibiotics; Yellow dyes (non-tartrazine); Aspirin; and Tape  Family History  Problem Relation Age of Onset  . Breast cancer Mother   . Cancer Mother   . Hypertension Mother   . Lung cancer Father   .  Hypertension Father   . Heart disease Father   . Cancer Father   . Kidney disease Sister   . Prostate cancer Neg Hx     Social History Social History  Substance Use Topics  . Smoking status: Former Smoker    Types: Cigarettes  . Smokeless tobacco: None     Comment: quit 45 years  . Alcohol Use: 0.0 oz/week    0 Standard drinks or equivalent per week     Comment: occasionally    Review of Systems Constitutional: No fever/chills Eyes: No visual changes. ENT: No sore throat. Cardiovascular: Denies chest pain. Respiratory: Denies shortness of breath. Gastrointestinal: No abdominal pain.  No nausea, no vomiting.  No diarrhea.  No constipation. Genitourinary: Negative for dysuria. Musculoskeletal: Negative for back pain. Skin: Negative for rash. Neurological: Negative for headaches, focal weakness or numbness.  10-point ROS otherwise negative.  ____________________________________________   PHYSICAL EXAM:  VITAL SIGNS: ED Triage Vitals  Enc Vitals Group     BP --      Pulse --      Resp --      Temp --      Temp src --      SpO2 --      Weight --      Height --      Head Cir --      Peak Flow --      Pain Score --      Pain Loc --      Pain Edu? --      Excl. in [place]? --    Constitutional: Alert and oriented. Well appearing and in no acute distress.Morbidly obese. Slightly pale in complexion. Appears chronically ill. Eyes: Conjunctivae are normal. PERRL. EOMI. Head: Atraumatic. Nose: No congestion/rhinnorhea. Mouth/Throat: Mucous membranes are slightly dry.  Oropharynx non-erythematous. Neck: No stridor.  No cervical spine tenderness. Cardiovascular: Normal rate, regular rhythm. Grossly normal heart sounds.  Good peripheral circulation. Respiratory: Normal respiratory effort.  No retractions. Lungs CTAB. Gastrointestinal: Soft and nontender. No distention.  Musculoskeletal: No lower extremity tenderness nor edema.  No joint effusions. Neurologic:  Normal  speech and language. No gross focal neurologic deficits are appreciated. Bilateral 5 strength in the upper extremities. No pronator drift. Normal cranial nerve exam. 4-5 strength in the lower extremity is bilateral. No focal deficits noted. Skin:  Skin is warm, dry and intact. No rash noted. Psychiatric: Mood and affect are normal. Speech and behavior are normal.  ____________________________________________   LABS (all labs ordered are listed, but only abnormal results are displayed)  Labs Reviewed  CBC - Abnormal; Notable for the following:    Hemoglobin 12.2 (*)    HCT 37.4 (*)    MCV 78.2 (*)    MCH 25.5 (*)    RDW 18.5 (*)    All other components within normal limits  COMPREHENSIVE METABOLIC PANEL - Abnormal; Notable for the following:    Glucose, Bld 267 (*)    BUN 25 (*)    Creatinine, Ser 1.33 (*)    GFR calc non Af Amer 55 (*)  All other components within normal limits  TROPONIN I  BRAIN NATRIURETIC PEPTIDE   ____________________________________________  EKG  Reviewed and interpreted by me at 1540 Heart rate 61 QRS 1:15 QTc 440 Normal sinus rhythm, evidence of Q waves in inferior distribution as well as anterolateral distribution. There is no evidence of acute ST elevation MI or active obvious infarction. As compared with the patient's previous EKG there is now slight flattening of T-wave in lead 2 as well as an inversion in aVL. ____________________________________________  RADIOLOGY  DG Chest Portable 1 View (Final result) Result time: 10/26/15 16:54:37   Final result by Rad Results In Interface (10/26/15 16:54:37)   Narrative:   CLINICAL DATA: 65 year old male with syncope. Initial encounter.  EXAM: PORTABLE CHEST 1 VIEW  COMPARISON: 12/18/2014 and earlier.  FINDINGS: Seated AP view of the chest. Large body habitus. Allowing for AP technique cardiac and mediastinal contours are within normal limits. Visualized tracheal air column is within normal  limits. No pneumothorax, pulmonary edema, pleural effusion or confluent pulmonary opacity.  IMPRESSION: No acute cardiopulmonary abnormality.   Electronically Signed By: Genevie Ann M.D. On: 10/26/2015 16:54          DG Knee 1-2 Views Right (Final result) Result time: 10/26/15 16:56:59   Final result by Rad Results In Interface (10/26/15 16:56:59)   Narrative:   CLINICAL DATA: 65 year old male with syncope and fall. Posterior right knee pain. Initial encounter.  EXAM: RIGHT KNEE - 1-2 VIEW  COMPARISON: Right femur series 10/21/2015  FINDINGS: Long segment vascular stent extending from the right mid thigh to the popliteal fossa re- demonstrated and appears stable in configuration. Sip there tricompartmental degenerative changes at the right knee with a space loss and bulky osteophytosis. No definite joint effusion. No acute fracture or dislocation identified about the right knee.  IMPRESSION: 1. No acute fracture or dislocation identified about the right knee. 2. Severe tricompartmental degenerative changes. 3. Long segment right lower extremity vascular stent appears stable.   Electronically Signed By: Genevie Ann M.D. On: 10/26/2015 16:56          CT Head Wo Contrast (Final result) Result time: 10/26/15 16:53:59   Final result by Rad Results In Interface (10/26/15 16:53:59)   Narrative:   CLINICAL DATA: Recent syncopal episode with posterior head injury, initial encounter  EXAM: CT HEAD WITHOUT CONTRAST  TECHNIQUE: Contiguous axial images were obtained from the base of the skull through the vertex without intravenous contrast.  COMPARISON: 10/19/2015  FINDINGS: Bony calvarium is intact. Mild atrophic changes are noted. There is again seen an area of decreased attenuation within the deep white matter or/basal ganglia on the left somewhat smaller than that seen on the prior exam consistent with prior infarct. No findings to suggest  acute hemorrhage, acute infarction or space-occupying mass lesion are noted.  IMPRESSION: Changes consistent with prior lacunar infarct on the left as described. No acute abnormality is noted.   Electronically Signed By: Inez Catalina M.D. On: 10/26/2015 16:53    ____________________________________________   PROCEDURES  Procedure(s) performed: None  Critical Care performed: No  ____________________________________________   INITIAL IMPRESSION / ASSESSMENT AND PLAN / ED COURSE  Pertinent labs & imaging results that were available during my care of the patient were reviewed by me and considered in my medical decision making (see chart for details).  Patient presents with syncopal episode, now well recovered with stable hemodynamics. He is known to have a slight nonspecific variability in his T waves versus previous, and in addition upon  triage initially with EMS his blood pressures less than 90. Given the patient's age, previous cardiac risks factors we will admit to the hospital. Daughter also reports he's had a lot of trouble walking since the stroke, feels that he likely needs physical therapy and additional resources in the home. ____________________________________________   FINAL CLINICAL IMPRESSION(S) / ED DIAGNOSES  Final diagnoses:  Syncope and collapse      Delman Kitten, MD 10/26/15 1829

## 2015-10-26 NOTE — ED Notes (Signed)
Pt presents to ED via EMS for syncope. States trying to walk outside when he passed out. Pt alerts and oriented at this time.

## 2015-10-27 DIAGNOSIS — R001 Bradycardia, unspecified: Secondary | ICD-10-CM | POA: Diagnosis not present

## 2015-10-27 DIAGNOSIS — E876 Hypokalemia: Secondary | ICD-10-CM | POA: Diagnosis not present

## 2015-10-27 DIAGNOSIS — R55 Syncope and collapse: Secondary | ICD-10-CM | POA: Diagnosis not present

## 2015-10-27 DIAGNOSIS — I639 Cerebral infarction, unspecified: Secondary | ICD-10-CM | POA: Diagnosis not present

## 2015-10-27 LAB — GLUCOSE, CAPILLARY
GLUCOSE-CAPILLARY: 192 mg/dL — AB (ref 65–99)
GLUCOSE-CAPILLARY: 305 mg/dL — AB (ref 65–99)
Glucose-Capillary: 249 mg/dL — ABNORMAL HIGH (ref 65–99)
Glucose-Capillary: 202 mg/dL — ABNORMAL HIGH (ref 65–99)

## 2015-10-27 LAB — URINALYSIS COMPLETE WITH MICROSCOPIC (ARMC ONLY)
BILIRUBIN URINE: NEGATIVE
Bacteria, UA: NONE SEEN
Hgb urine dipstick: NEGATIVE
Ketones, ur: NEGATIVE mg/dL
Leukocytes, UA: NEGATIVE
Nitrite: NEGATIVE
PH: 5 (ref 5.0–8.0)
Protein, ur: NEGATIVE mg/dL
Specific Gravity, Urine: 1.024 (ref 1.005–1.030)

## 2015-10-27 LAB — BASIC METABOLIC PANEL
ANION GAP: 8 (ref 5–15)
BUN: 21 mg/dL — ABNORMAL HIGH (ref 6–20)
CALCIUM: 8.7 mg/dL — AB (ref 8.9–10.3)
CHLORIDE: 104 mmol/L (ref 101–111)
CO2: 27 mmol/L (ref 22–32)
Creatinine, Ser: 1.13 mg/dL (ref 0.61–1.24)
GFR calc non Af Amer: >60 mL/min (ref >60)
Glucose, Bld: 233 mg/dL — ABNORMAL HIGH (ref 65–99)
POTASSIUM: 3.2 mmol/L — AB (ref 3.5–5.1)
Sodium: 139 mmol/L (ref 135–145)

## 2015-10-27 LAB — CBC
HCT: 36.8 % — ABNORMAL LOW (ref 40.0–52.0)
HEMOGLOBIN: 12.2 g/dL — AB (ref 13.0–18.0)
MCH: 26.3 pg (ref 26.0–34.0)
MCHC: 33.1 g/dL (ref 32.0–36.0)
MCV: 79.6 fL — ABNORMAL LOW (ref 80.0–100.0)
Platelets: 148 10*3/uL — ABNORMAL LOW (ref 150–440)
RBC: 4.62 MIL/uL (ref 4.40–5.90)
RDW: 18.7 % — ABNORMAL HIGH (ref 11.5–14.5)
WBC: 4.2 10*3/uL (ref 3.8–10.6)

## 2015-10-27 LAB — HEMOGLOBIN A1C: Hgb A1c MFr Bld: 9 % — ABNORMAL HIGH (ref 4.0–6.0)

## 2015-10-27 LAB — MRSA PCR SCREENING: MRSA by PCR: NEGATIVE

## 2015-10-27 MED ORDER — POTASSIUM CHLORIDE CRYS ER 20 MEQ PO TBCR
40.0000 meq | EXTENDED_RELEASE_TABLET | Freq: Once | ORAL | Status: AC
  Administered 2015-10-27: 40 meq via ORAL
  Filled 2015-10-27: qty 2

## 2015-10-27 MED ORDER — POLYETHYLENE GLYCOL 3350 17 G PO PACK
17.0000 g | PACK | Freq: Every day | ORAL | Status: DC
  Filled 2015-10-27 (×2): qty 1

## 2015-10-27 MED ORDER — ENOXAPARIN SODIUM 40 MG/0.4ML ~~LOC~~ SOLN
40.0000 mg | Freq: Two times a day (BID) | SUBCUTANEOUS | Status: DC
  Administered 2015-10-27 – 2015-11-01 (×10): 40 mg via SUBCUTANEOUS
  Filled 2015-10-27 (×10): qty 0.4

## 2015-10-27 MED ORDER — INSULIN ASPART 100 UNIT/ML ~~LOC~~ SOLN
0.0000 [IU] | Freq: Three times a day (TID) | SUBCUTANEOUS | Status: DC
  Administered 2015-10-27 – 2015-10-28 (×3): 3 [IU] via SUBCUTANEOUS
  Administered 2015-10-28: 5 [IU] via SUBCUTANEOUS
  Administered 2015-10-28 – 2015-10-29 (×2): 2 [IU] via SUBCUTANEOUS
  Administered 2015-10-29: 5 [IU] via SUBCUTANEOUS
  Administered 2015-10-29 (×2): 2 [IU] via SUBCUTANEOUS
  Administered 2015-10-30 (×2): 1 [IU] via SUBCUTANEOUS
  Administered 2015-10-30 (×2): 2 [IU] via SUBCUTANEOUS
  Administered 2015-10-31: 1 [IU] via SUBCUTANEOUS
  Administered 2015-10-31: 3 [IU] via SUBCUTANEOUS
  Administered 2015-10-31 (×2): 1 [IU] via SUBCUTANEOUS
  Administered 2015-11-01 (×2): 2 [IU] via SUBCUTANEOUS
  Administered 2015-11-01 – 2015-11-02 (×3): 1 [IU] via SUBCUTANEOUS
  Administered 2015-11-02 (×2): 2 [IU] via SUBCUTANEOUS
  Administered 2015-11-03: 1 [IU] via SUBCUTANEOUS
  Administered 2015-11-03: 3 [IU] via SUBCUTANEOUS
  Filled 2015-10-27: qty 5
  Filled 2015-10-27 (×2): qty 2
  Filled 2015-10-27: qty 3
  Filled 2015-10-27 (×2): qty 1
  Filled 2015-10-27: qty 2
  Filled 2015-10-27: qty 1
  Filled 2015-10-27: qty 5
  Filled 2015-10-27: qty 2
  Filled 2015-10-27: qty 1
  Filled 2015-10-27: qty 3
  Filled 2015-10-27: qty 2
  Filled 2015-10-27: qty 1
  Filled 2015-10-27: qty 2
  Filled 2015-10-27 (×2): qty 1
  Filled 2015-10-27 (×3): qty 3
  Filled 2015-10-27: qty 1
  Filled 2015-10-27 (×3): qty 2
  Filled 2015-10-27: qty 1
  Filled 2015-10-27: qty 2

## 2015-10-27 NOTE — Progress Notes (Signed)
Patient ID: Victor Wise, male   DOB: [birth year], 65 y.o.   MRN: [id number] [employer] PROGRESS NOTE  DRACEN FLUHARTY [id number] DOB: May 30, 1950 DOA: 10/26/2015 PCP: Lavera Guise, MD  HPI/Subjective: Patient feeling very weak and unable to do much. Patient admitted with syncope.  Objective: Filed Vitals:   10/27/15 0820 10/27/15 1117  BP: 129/81 154/55  Pulse: 59 50  Temp:    Resp:  21    Filed Weights   10/26/15 1541 10/26/15 2112  Weight: 133.811 kg (295 lb) 136.85 kg (301 lb 11.2 oz)    ROS: Review of Systems  Constitutional: Negative for fever and chills.  Eyes: Negative for blurred vision.  Respiratory: Negative for cough and shortness of breath.   Cardiovascular: Negative for chest pain.  Gastrointestinal: Negative for nausea, vomiting, abdominal pain, diarrhea and constipation.  Genitourinary: Negative for dysuria.  Musculoskeletal: Negative for joint pain.  Neurological: Negative for dizziness and headaches.   Exam: Physical Exam  Constitutional: He is oriented to person, place, and time.  HENT:  Nose: No mucosal edema.  Mouth/Throat: No oropharyngeal exudate or posterior oropharyngeal edema.  Eyes: Conjunctivae, EOM and lids are normal. Pupils are equal, round, and reactive to light.  Neck: No JVD present. Carotid bruit is not present. No edema present. No thyroid mass and no thyromegaly present.  Cardiovascular: S1 normal and S2 normal.  Exam reveals no gallop.   No murmur heard. Pulses:      Dorsalis pedis pulses are 2+ on the right side, and 2+ on the left side.  Respiratory: No respiratory distress. He has no wheezes. He has no rhonchi. He has no rales.  GI: Soft. Bowel sounds are normal. There is no tenderness.  Musculoskeletal:       Right ankle: He exhibits swelling.       Left ankle: He exhibits swelling.  Lymphadenopathy:    He has no cervical adenopathy.  Neurological: He is alert and oriented to person, place, and time. No cranial nerve  deficit.  Skin: Skin is warm. No rash noted. Nails show no clubbing.  Psychiatric: He has a normal mood and affect.      Data Reviewed: Basic Metabolic Panel:  Recent Labs Lab 10/26/15 1533 10/27/15 0531  NA 137 139  K 3.7 3.2*  CL 102 104  CO2 25 27  GLUCOSE 267* 233*  BUN 25* 21*  CREATININE 1.33* 1.13  CALCIUM 9.2 8.7*   Liver Function Tests:  Recent Labs Lab 10/26/15 1533  AST 21  ALT 20  ALKPHOS 54  BILITOT 0.6  PROT 6.9  ALBUMIN 4.2   CBC:  Recent Labs Lab 10/26/15 1533 10/27/15 0531  WBC 6.0 4.2  HGB 12.2* 12.2*  HCT 37.4* 36.8*  MCV 78.2* 79.6*  PLT 186 148*   Cardiac Enzymes:  Recent Labs Lab 10/26/15 1533  TROPONINI <0.03   BNP (last 3 results)  Recent Labs  10/26/15 1533  BNP 19.0     CBG:  Recent Labs Lab 10/21/15 1724 10/26/15 2017 10/26/15 2119 10/27/15 0721 10/27/15 1116  GLUCAP 168* 205* 226* 192* 305*    Recent Results (from the past 240 hour(s))  MRSA PCR Screening     Status: None   Collection Time: 10/26/15 10:32 PM  Result Value Ref Range Status   MRSA by PCR NEGATIVE NEGATIVE Final    Comment:        The GeneXpert MRSA Assay (FDA approved for NASAL specimens only), is one component of a comprehensive  MRSA colonization surveillance program. It is not intended to diagnose MRSA infection nor to guide or monitor treatment for MRSA infections.      Studies: Dg Knee 1-2 Views Right  10/26/2015  CLINICAL DATA:  65 year old male with syncope and fall. Posterior right knee pain. Initial encounter. EXAM: RIGHT KNEE - 1-2 VIEW COMPARISON:  Right femur series  10/21/2015 FINDINGS: Long segment vascular stent extending from the right mid thigh to the popliteal fossa re- demonstrated and appears stable in configuration. Sip there tricompartmental degenerative changes at the right knee with a space loss and bulky osteophytosis. No definite joint effusion. No acute fracture or dislocation identified about the right  knee. IMPRESSION: 1. No acute fracture or dislocation identified about the right knee. 2. Severe tricompartmental degenerative changes. 3. Long segment right lower extremity vascular stent appears stable. Electronically Signed   By: Genevie Ann M.D.   On: 10/26/2015 16:56   Ct Head Wo Contrast  10/26/2015  CLINICAL DATA:  Recent syncopal episode with posterior head injury, initial encounter EXAM: CT HEAD WITHOUT CONTRAST TECHNIQUE: Contiguous axial images were obtained from the base of the skull through the vertex without intravenous contrast. COMPARISON:  10/19/2015 FINDINGS: Bony calvarium is intact. Mild atrophic changes are noted. There is again seen an area of decreased attenuation within the deep white matter or/basal ganglia on the left somewhat smaller than that seen on the prior exam consistent with prior infarct. No findings to suggest acute hemorrhage, acute infarction or space-occupying mass lesion are noted. IMPRESSION: Changes consistent with prior lacunar infarct on the left as described. No acute abnormality is noted. Electronically Signed   By: Inez Catalina M.D.   On: 10/26/2015 16:53   Dg Chest Portable 1 View  10/26/2015  CLINICAL DATA:  65 year old male with syncope. Initial encounter. EXAM: PORTABLE CHEST 1 VIEW COMPARISON:  12/18/2014 and earlier. FINDINGS: Seated AP view of the chest. Large body habitus. Allowing for AP technique cardiac and mediastinal contours are within normal limits. Visualized tracheal air column is within normal limits. No pneumothorax, pulmonary edema, pleural effusion or confluent pulmonary opacity. IMPRESSION: No acute cardiopulmonary abnormality. Electronically Signed   By: Genevie Ann M.D.   On: 10/26/2015 16:54    Scheduled Meds: . aspirin EC  81 mg Oral Daily  . buPROPion  300 mg Oral Daily  . busPIRone  10 mg Oral QHS  . clopidogrel  75 mg Oral Daily  . DULoxetine  30 mg Oral QHS  . DULoxetine  60 mg Oral Daily  . enoxaparin (LOVENOX) injection  40 mg  Subcutaneous Q12H  . [START ON 11/01/2015] Exenatide ER  2 mg Subcutaneous Weekly  . fenofibrate  160 mg Oral QPM  . fluticasone  2 spray Each Nare Daily  . insulin aspart  0-9 Units Subcutaneous TID WC & HS  . insulin glargine  60 Units Subcutaneous QHS  . loratadine  10 mg Oral Daily  . omega-3 acid ethyl esters  1 g Oral BID  . Oxcarbazepine  300 mg Oral Daily  . OXcarbazepine  600 mg Oral QHS  . oxyCODONE  20 mg Oral Q12H  . pantoprazole  40 mg Oral Daily  . polyethylene glycol  17 g Oral Daily  . pregabalin  200 mg Oral QHS  . pregabalin  400 mg Oral Daily  . sodium chloride flush  3 mL Intravenous Q12H  . tamsulosin  0.4 mg Oral QPC supper    Assessment/Plan:  1. Syncope with bradycardia. Cardiology would like  to monitor further for sick sinus syndrome on whether the patient may need a pacemaker or not. Patient's Norvasc was held. Patient is not on any medications that would cause bradycardia. Heart rate still on the slower side but better than when he came in. 2. Weakness. Physical therapy recommended rehabilitation.  3. Recent stroke. Patient on aspirin and Plavix. Patient has side effects with statins and unable to give. 4. Hypokalemia. Replace potassium orally and check a magnesium in the a.m. 5. Type 2 diabetes uncontrolled. Check a hemoglobin A1c continue sliding scale insulin and Lantus 6. Morbid obesity. Weight loss needed 7. BPH on Flomax 8. Depression. Continue psychiatric medications   Code Status:     Code Status Orders        Start     Ordered   10/26/15 2112  Full code   Continuous     10/26/15 2111    Code Status History    Date Active Date Inactive Code Status Order ID Comments User Context   10/19/2015 10:00 PM 10/21/2015 11:51 PM Full Code [id number]  Gladstone Lighter, MD Inpatient   12/19/2014  9:31 PM 12/21/2014  5:10 PM Full Code [id number]  Charolette Forward, MD Inpatient   12/19/2014  9:23 PM 12/19/2014  9:31 PM Full Code [id number]  Charolette Forward, MD Inpatient    12/19/2014 12:38 PM 12/19/2014  5:18 PM Full Code [id number]  Dionisio Trevel, MD Inpatient   12/18/2014  6:36 PM 12/19/2014 12:38 PM Full Code [id number]  Henreitta Leber, MD Inpatient    Advance Directive Documentation        Most Recent Value   Type of Advance Directive  Living will   Pre-existing out of facility DNR order (yellow form or pink MOST form)     "MOST" Form in Place?       Family Communication: Spoke with daughter on phone  Disposition Plan: Will need rehabilitation   Consultants:  Cardiology   Time spent: 25 minutes  [place], [place]

## 2015-10-27 NOTE — Progress Notes (Signed)
Central telemetry called to report a 30 beat run of widened QRS. Reported to on- call doctor.

## 2015-10-27 NOTE — Progress Notes (Signed)
[facility]  Patient Status: Active  [facility] is providing the following services: SN/PT/MSW/HHA  If patient discharges after hours, please call [phone].   Victor Wise 10/27/2015, 9:05 AM

## 2015-10-27 NOTE — Care Management Obs Status (Signed)
[facility] NOTIFICATION   Patient Details  Name: Victor Wise MRN: [id number] Date of Birth: 07-18-1950   Medicare Observation Status Notification Given:  Yes    Jolly Mango, RN 10/27/2015, 11:26 AM

## 2015-10-27 NOTE — Consult Note (Signed)
   [facility] CM Inpatient Consult   10/27/2015  Victor Wise [birth year] [id number]   Met with patient at the bedside and he stated he was unsure of his discharge disposition yet. Liaison made him aware that [facility] care Management services will contact him when he is discharged. He expressed gratitude for his community nurse Maudie Mercury and stated she and Chrystal had been very helpful to him.Patient is currently active with [facility] for chronic disease management services.  Patient has been engaged by a [employer] and CSW.  Our community based plan of care has focused on disease management and community resource support.  Patient will receive a post discharge transition of care call and will be evaluated for monthly home visits for assessments and disease process education.  Made Inpatient Case Manager aware that [facility] following. Of note, [facility] Care Management services does not replace or interfere with any services that are needed or arranged by inpatient case management or social work.  For additional questions or referrals please contact:  Allye Hoyos RN, [facility] Liaison  [phone]) [place] [phone]) Toll free office

## 2015-10-27 NOTE — Plan of Care (Signed)
Problem: Safety: Goal: Ability to remain free from injury will improve Outcome: Progressing Fall precautions in place  Problem: Health Behavior/Discharge Planning: Goal: Ability to manage health-related needs will improve Outcome: Not Progressing Social work consult  Problem: Pain Managment: Goal: General experience of comfort will improve Outcome: Not Progressing Chronic pain   Problem: Tissue Perfusion: Goal: Risk factors for ineffective tissue perfusion will decrease Outcome: Progressing SQ heparin

## 2015-10-27 NOTE — [place] FL2 (Signed)
[place] LEVEL OF CARE SCREENING TOOL     IDENTIFICATION  Patient Name: Victor Wise Birthdate: 03/17/1951 Sex: male Admission Date (Current Location): 10/26/2015  [place] and [place] Number:  Engineering geologist and Address:  [facility], [address] [place], [place] [address]      Provider Number: [id number]  Attending Physician Name and Address:  Loletha Grayer, MD  Relative Name and Phone Number:       Current Level of Care: Hospital Recommended Level of Care: [place] Prior Approval Number:    Date Approved/Denied:   PASRR Number: [id number]  Discharge Plan: SNF    Current Diagnoses: Patient Active Problem List   Diagnosis Date Noted  . Syncopal episodes 10/26/2015  . Syncope 10/26/2015  . Adjustment disorder with mixed anxiety and depressed mood 10/20/2015  . Dysthymia 10/20/2015  . CVA (cerebral infarction) 10/19/2015  . Urinary retention 03/25/2015  . 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 Acute MI, anterolateral wall, subsequent episode of care ([place]) 12/19/2014  . SIRS (systemic inflammatory response syndrome) (HCC) 12/18/2014    Orientation RESPIRATION BLADDER Height & Weight     Self, Situation, Place  Normal Incontinent Weight: (!) 301 lb 11.2 oz (136.85 kg) Height:  5' 8.5" (174 cm)  BEHAVIORAL SYMPTOMS/MOOD NEUROLOGICAL BOWEL NUTRITION STATUS   (none)  (none) Continent Diet (heart healthy/carb modified)  AMBULATORY STATUS COMMUNICATION OF NEEDS Skin   Extensive Assist Verbally Normal                       Personal Care Assistance Level of Assistance  Bathing, Dressing Bathing Assistance: Limited assistance   Dressing Assistance: Limited assistance     Functional Limitations Info             SPECIAL CARE FACTORS FREQUENCY  PT (By licensed PT)                    Contractures Contractures Info: Not present    Additional Factors Info  Code Status, Allergies Code  Status Info: full Allergies Info: Allergies: Amoxicillin, Other, Penicillins, Hydralazine, Crestor, Metoprolol, Statins, Sulfa Antibiotics, Yellow Dyes (Non-tartrazine), Aspirin, Tape           Current Medications (10/27/2015):  This is the current hospital active medication list Current Facility-Administered Medications  Medication Dose Route Frequency Provider Last Rate Last Dose  . aspirin EC tablet 81 mg  81 mg Oral Daily Vaughan Basta, MD   81 mg at 10/27/15 0912  . buPROPion (WELLBUTRIN XL) 24 hr tablet 300 mg  300 mg Oral Daily Vaughan Basta, MD   300 mg at 10/27/15 0911  . busPIRone (BUSPAR) tablet 10 mg  10 mg Oral QHS Vaughan Basta, MD   10 mg at 10/26/15 2209  . clopidogrel (PLAVIX) tablet 75 mg  75 mg Oral Daily Vaughan Basta, MD   75 mg at 10/27/15 0912  . DULoxetine (CYMBALTA) DR capsule 30 mg  30 mg Oral QHS Vira Blanco, RPH   30 mg at 10/26/15 2209  . DULoxetine (CYMBALTA) DR capsule 60 mg  60 mg Oral Daily Vira Blanco, RPH   60 mg at 10/27/15 0912  . enoxaparin (LOVENOX) injection 40 mg  40 mg Subcutaneous Q12H Loletha Grayer, MD      . Derrill Memo ON 11/01/2015] Exenatide ER PEN 2 mg  2 mg Subcutaneous Weekly Vaughan Basta, MD      . fenofibrate tablet 160 mg  160 mg  Oral QPM Vaughan Basta, MD   160 mg at 10/26/15 2209  . fluticasone (FLONASE) 50 MCG/ACT nasal spray 2 spray  2 spray Each Nare Daily Vaughan Basta, MD   2 spray at 10/27/15 0913  . insulin aspart (novoLOG) injection 0-9 Units  0-9 Units Subcutaneous TID WC & HS Loletha Grayer, MD      . insulin glargine (LANTUS) injection 60 Units  60 Units Subcutaneous QHS Vaughan Basta, MD   60 Units at 10/26/15 2208  . loratadine (CLARITIN) tablet 10 mg  10 mg Oral Daily Vaughan Basta, MD   10 mg at 10/27/15 0912  . metaxalone (SKELAXIN) tablet 400-800 mg  400-800 mg Oral TID PRN Vaughan Basta, MD      . omega-3 acid ethyl esters (LOVAZA) capsule  1 g  1 g Oral BID Vaughan Basta, MD   1 g at 10/27/15 0912  . Oxcarbazepine (TRILEPTAL) tablet 300 mg  300 mg Oral Daily Vira Blanco, RPH   300 mg at 10/27/15 0913  . Oxcarbazepine (TRILEPTAL) tablet 600 mg  600 mg Oral QHS Vira Blanco, RPH   600 mg at 10/26/15 2208  . oxyCODONE (Oxy IR/ROXICODONE) immediate release tablet 5 mg  5 mg Oral TID PRN Vaughan Basta, MD      . oxyCODONE (OXYCONTIN) 12 hr tablet 20 mg  20 mg Oral Q12H Vaughan Basta, MD   20 mg at 10/27/15 0912  . pantoprazole (PROTONIX) EC tablet 40 mg  40 mg Oral Daily Vaughan Basta, MD   40 mg at 10/27/15 0821  . polyethylene glycol (MIRALAX / GLYCOLAX) packet 17 g  17 g Oral Daily Loletha Grayer, MD   17 g at 10/27/15 1429  . pregabalin (LYRICA) capsule 200 mg  200 mg Oral QHS Vira Blanco, RPH   200 mg at 10/26/15 2209  . pregabalin (LYRICA) capsule 400 mg  400 mg Oral Daily Vira Blanco, RPH   400 mg at 10/27/15 0911  . sodium chloride flush (NS) 0.9 % injection 3 mL  3 mL Intravenous Q12H Vaughan Basta, MD   3 mL at 10/27/15 0913  . tamsulosin (FLOMAX) capsule 0.4 mg  0.4 mg Oral QPC supper Vaughan Basta, MD         Discharge Medications: Please see discharge summary for a list of discharge medications.  Relevant Imaging Results:  Relevant Lab Results:   Additional Information ss: [id number]  Shela Leff, LCSW

## 2015-10-27 NOTE — Care Management (Signed)
Reviewed previous CM notes. Chart reviewed. Case discussed with physical therapy. PT recommending SNF. PT states patient and family are agreeable. Will update CSW.

## 2015-10-27 NOTE — Progress Notes (Signed)
Inpatient Diabetes Program Recommendations  AACE/ADA: New Consensus Statement on Inpatient Glycemic Control (2015)  Target Ranges:  Prepandial:   less than 140 mg/dL      Peak postprandial:   less than 180 mg/dL (1-2 hours)      Critically ill patients:  140 - 180 mg/dL   Lab Results  Component Value Date   GLUCAP 192* 10/27/2015   HGBA1C 7.8* 12/19/2014    Review of Glycemic Control  Results for JAHKARI, MANIS (MRN [id number]) as of 10/27/2015 11:02  Ref. Range 10/21/2015 12:54 10/21/2015 17:24 10/26/2015 20:17 10/26/2015 21:19 10/27/2015 07:21  Glucose-Capillary Latest Ref Range: 65-99 mg/dL 235 (H) 168 (H) 205 (H) 226 (H) 192 (H)    Diabetes history: Type 2 Outpatient Diabetes medications: Bydureon 2mg  q Sunday, Novolin R 0-15 units tid with meals, Lantus 60 units qhs Current orders for Inpatient glycemic control: Bydureon 2mg  q Sunday, Novolog 0-9 unit tid with meals, Lantus 60 units qhs  Inpatient Diabetes Program Recommendations:  Please consider checking an A1C to determine blood sugar control prior to admission.  Consider adding Novolog 3 units tid with meals, increase Novolog correction to 0-15 units tid and add Novolog correction at hs 0-5 units.  Gentry Fitz, RN, BA, MHA, CDE Diabetes Coordinator Inpatient Diabetes Program  [phone] (Team Pager) [phone] ([place]) 10/27/2015 11:09 AM

## 2015-10-27 NOTE — Care Management (Signed)
Spoke with daughter who is agreeable to SNF and would like to speak with CSW. CSW notified.

## 2015-10-27 NOTE — Clinical Social Work Note (Signed)
Clinical Social Work Assessment  Patient Details  Name: Victor Wise MRN: [id number] Date of Birth: 1950-08-05  Date of referral:  10/27/15               Reason for consult:  Discharge Planning                Permission sought to share information with:  Family Supports Permission granted to share information::  Yes, Verbal Permission Granted  Name::        Agency::     Relationship::   [name]- Daughter)  Contact Information:     Housing/Transportation Living arrangements for the past 2 months:  Single Family Home Source of Information:  Patient, Adult Children Patient Interpreter Needed:  None Criminal Activity/Legal Involvement Pertinent to Current Situation/Hospitalization:  No - Comment as needed Significant Relationships:  Adult Children, Other Family Members Lives with:  Self Do you feel safe going back to the place where you live?  Yes Need for family participation in patient care:  Yes (Comment) [name]- Daughter)  Care giving concerns:  Patient and his daughter feels that STR is appropriate to build strength.    Social Worker assessment / plan:  CSW met with patient at bedside. Introduced herself and her role. Per patient he is interested in SNF placement. Reported that he wants PT to help build up his strength. Patient reported he's familiar with SNFs in the area. Reported he'd like to go to [facility] if they are able to offer him a bed. Granted CSW verbal permission to send SNF referral and to speak to his daughter about discharge plans. FL2/ PASRR completed and SNF referral sent to SNFs in [facility]. CSW will continue to follow and assit.   Employment status:  Retired Nurse, adult PT Recommendations:  [place] / Referral to community resources:  [place]  Patient/Family's Response to care:  Patient is in agreement with SNF placement.   Patient/Family's Understanding of and Emotional  Response to Diagnosis, Current Treatment, and Prognosis:  Patient is appreciative of CSW assistance.   Emotional Assessment Appearance:  Appears stated age Attitude/Demeanor/Rapport:   (None) Affect (typically observed):  Calm, Pleasant Orientation:  Oriented to Self, Oriented to Place, Oriented to  Time, Oriented to Situation Alcohol / Substance use:  Not Applicable Psych involvement (Current and /or in the community):  No (Comment)  Discharge Needs  Concerns to be addressed:  Discharge Planning Concerns Readmission within the last 30 days:  No Current discharge risk:  Chronically ill Barriers to Discharge:  Continued Medical Work up   [employer], LCSW 10/27/2015, 4:12 PM

## 2015-10-27 NOTE — Evaluation (Signed)
Physical Therapy Evaluation Patient Details Name: Victor Wise MRN: [id number] DOB: 1951/02/25 Today's Date: 10/27/2015   History of Present Illness  65 y/o male here s/o syncopal episode with fall.  He was in the hospital last week with acute CVA.  He was discharged home but has not been doing very well since that time.   Clinical Impression  Pt initially appears to be able to do more than he ultimately was able to.  He stood up and took a few steps and then clearly became fatigued and lacked confidence to do any more.  He made good attempts at donning socks at EOB but needed assist.  Pt has had a lot of falls at home recently and generally has not been doing very well.  Pt will need rehab to work toward getting back to his baseline.     Follow Up Recommendations SNF    Equipment Recommendations  Rolling walker with 5" wheels    Recommendations for Other Services       Precautions / Restrictions Precautions Precautions: Fall Restrictions Weight Bearing Restrictions: No      Mobility  Bed Mobility Overal bed mobility: Needs Assistance Bed Mobility: Supine to Sit;Sit to Supine     Supine to sit: Min guard Sit to supine: Min guard   General bed mobility comments: Pt is able to use leverage to get up to sitting with great effort, getting back to supine did not require direct assist though he struggled with the ffort  Transfers Overall transfer level: Needs assistance Equipment used: Rolling walker (2 wheeled) Transfers: Sit to/from Stand Sit to Stand: Min assist         General transfer comment: light cuing and hands on assist for safety rising to standing.  Pt is able to maintain balance with walker but is not confident or steady with this  Ambulation/Gait Ambulation/Gait assistance: Min assist Ambulation Distance (Feet): 10 Feet Assistive device: Rolling walker (2 wheeled)       General Gait Details: Pt starts out walking as though he would be able to go  relatively far but quickly becomes fatigued and hesitant and ultimately is not able to even walk to the door.  Pt very tired with the minimal effort and though he did not have any LOBs he seemed heavily reliant on the walker and needs a lot of cuing to just stay in it and use is properly.   Stairs            Wheelchair Mobility    Modified Rankin (Stroke Patients Only)       Balance     Sitting balance-Leahy Scale: Good       Standing balance-Leahy Scale: Fair                               Pertinent Vitals/Pain Pain Assessment: No/denies pain    Home Living Family/patient expects to be discharged to:: Private residence Living Arrangements: Children Available Help at Discharge: Family                  Prior Function Level of Independence: Needs assistance   Gait / Transfers Assistance Needed: Pt has been using SPC and also falling a lot with the cane  ADL's / Homemaking Assistance Needed: Pt able to assist with dressing, etc but does need assist with socks, etc        Hand Dominance   Dominant Hand: Right  Extremity/Trunk Assessment   Upper Extremity Assessment: Generalized weakness (R grossly weaker than L: 3 to 3-/5 (h/o R RTC limitations))           Lower Extremity Assessment: Generalized weakness (grossly 4-/5 t/o b/l LEs)         Communication   Communication: No difficulties  Cognition Arousal/Alertness: Awake/alert Behavior During Therapy: Restless Overall Cognitive Status: Within Functional Limits for tasks assessed                      General Comments      Exercises        Assessment/Plan    PT Assessment Patient needs continued PT services  PT Diagnosis Difficulty walking;Generalized weakness   PT Problem List Decreased strength;Decreased range of motion;Decreased activity tolerance;Decreased balance;Decreased mobility;Decreased coordination;Decreased knowledge of use of DME;Decreased  cognition;Decreased safety awareness  PT Treatment Interventions DME instruction;Gait training;Stair training;Functional mobility training;Therapeutic activities;Therapeutic exercise;Balance training;Patient/family education;Cognitive remediation   PT Goals (Current goals can be found in the Care Plan section) Acute Rehab PT Goals Patient Stated Goal: get back to how I was  PT Goal Formulation: With patient/family Time For Goal Achievement: 11/10/15 Potential to Achieve Goals: Good    Frequency Min 2X/week   Barriers to discharge        Co-evaluation               End of Session Equipment Utilized During Treatment: Gait belt Activity Tolerance: Patient limited by fatigue        Functional Assessment Tool Used: clinical judgement Functional Limitation: Mobility: Walking and moving around Mobility: Walking and Moving Around Current Status [id number]): At least 60 percent but less than 80 percent impaired, limited or restricted Mobility: Walking and Moving Around Goal Status [phone]): At least 20 percent but less than 40 percent impaired, limited or restricted    Time: 1005-1027 PT Time Calculation (min) (ACUTE ONLY): 22 min   Charges:   PT Evaluation $PT Eval Low Complexity: 1 Procedure     PT G Codes:   PT G-Codes **NOT FOR INPATIENT CLASS** Functional Assessment Tool Used: clinical judgement Functional Limitation: Mobility: Walking and moving around Mobility: Walking and Moving Around Current Status [id number]): At least 60 percent but less than 80 percent impaired, limited or restricted Mobility: Walking and Moving Around Goal Status [phone]): At least 20 percent but less than 40 percent impaired, limited or restricted    Kreg Shropshire, DPT  10/27/2015, 11:44 AM

## 2015-10-27 NOTE — Consult Note (Signed)
[facility] Consultation Note  Patient ID: Victor Wise, MRN: [id number], DOB/AGE: Jul 25, 1950 65 y.o. Admit date: 10/26/2015   Date of Consult: 10/27/2015 Primary Physician: Lavera Guise, MD Primary [place]  Chief Complaint:  Chief Complaint  Patient presents with  . Loss of Consciousness   Reason for Consult: syncope  HPI: 65 y.o. male with known coronary artery disease status post previous stenting chronic obstructive point disease with sleep apnea essential hypertension morbid obesity diastolic dysfunction who is had recent episodes of the dizziness and presyncope. In the past he has had multiple medications for diastolic dysfunction including beta blocker for which she could not tolerate due to symptomatic bradycardia. The patient since has been followed without issue. Recently the patient has had worsening symptoms of shortness of breath and has had a syncopal episode for which she did not hurt himself but was on the floor. There was unknown period of time that he was out. He did not have any chest pain or heart failure type symptoms when seen in the emergency room had an EKG showing sinus bradycardia. Since then his telemetry has shown sinus bradycardia in the 40 beat per minute range and has had some periods of sinus bradycardia with junctional escape or escape. This is at about 42 bpm. At that time he did not have any presyncope and/or dizziness. Currently the patient is hemodynamically stable with no further evidence of significant symptoms but apparently recent echocardiogram has shown normal LV systolic function  Past Medical History  Diagnosis Date  . Diabetes ([place])   . Hypertension   . BPH (benign prostatic hyperplasia)   . 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 Morbid obesity ([place])   . Obstructive sleep apnea   . Chronic back pain   . Depression   . UTI (lower urinary tract infection)   . Status post insertion of spinal cord stimulator   . Heart attack  ([place])   . Stroke ([place])   . Basal cell carcinoma     forehead  . GERD (gastroesophageal reflux disease)   . Allergy       Surgical History:  Past Surgical History  Procedure Laterality Date  . Pain stimulator    . Right toe amputation    . Cardiac catheterization N/A 12/19/2014    Procedure: Coronary Stent Intervention;  Surgeon: Charolette Forward, MD;  Location: [place] [facility];  Service: Cardiovascular;  Laterality: N/A;  . Cardiac catheterization Left 12/19/2014    Procedure: Left Heart Cath and Coronary Angiography;  Surgeon: Dionisio Jigar, MD;  Location: [place] [facility];  Service: Cardiovascular;  Laterality: Left;     Home Meds: Prior to Admission medications   Medication Sig Start Date End Date Taking? Authorizing Provider  amLODipine (NORVASC) 10 MG tablet Take 10 mg by mouth daily.   Yes Historical Provider, MD  aspirin EC 81 MG tablet Take 81 mg by mouth daily.   Yes Historical Provider, MD  benazepril (LOTENSIN) 40 MG tablet Take 40 mg by mouth 2 (two) times daily.    Yes Historical Provider, MD  buPROPion (WELLBUTRIN XL) 150 MG 24 hr tablet Take 300 mg by mouth daily.    Yes Historical Provider, MD  busPIRone (BUSPAR) 10 MG tablet Take 10 mg by mouth at bedtime.   Yes Historical Provider, MD  cetirizine (ZYRTEC) 10 MG tablet Take 10 mg by mouth at bedtime as needed for allergies.    Yes Historical Provider, MD  clopidogrel (PLAVIX) 75 MG  tablet Take 1 tablet (75 mg total) by mouth daily. 10/21/15  Yes Nicholes Mango, MD  DULoxetine (CYMBALTA) 30 MG capsule Take 30-60 mg by mouth 2 (two) times daily. Pt takes two capsules in the morning and one at night.   Yes Historical Provider, MD  Exenatide ER (BYDUREON) 2 MG PEN Inject 2 mg into the skin once a week. Pt uses on Sunday.   Yes Historical Provider, MD  fenofibrate 160 MG tablet Take 1 tablet (160 mg total) by mouth every evening. 10/21/15  Yes Nicholes Mango, MD  fluticasone (FLONASE) 50 MCG/ACT nasal spray Place 2 sprays into both  nostrils daily.   Yes Historical Provider, MD  hydrochlorothiazide (MICROZIDE) 12.5 MG capsule Take 12.5 mg by mouth daily.   Yes Historical Provider, MD  insulin glargine (LANTUS) 100 UNIT/ML injection Inject 60 Units into the skin at bedtime.   Yes Historical Provider, MD  insulin regular (NOVOLIN R,HUMULIN R) 100 units/mL injection Inject 0-15 Units into the skin 3 (three) times daily with meals as needed for high blood sugar. Pt uses as needed per sliding scale.   Yes Historical Provider, MD  metaxalone (SKELAXIN) 800 MG tablet Take 400-800 mg by mouth 3 (three) times daily as needed for muscle spasms.   Yes Historical Provider, MD  NARCAN 4 MG/0.1ML LIQD Take 4 mg by mouth as needed (for opioid overdose).    Yes Historical Provider, MD  nitroGLYCERIN (NITROSTAT) 0.4 MG SL tablet Place 1 tablet (0.4 mg total) under the tongue every 5 (five) minutes x 3 doses as needed for chest pain. 12/21/14  Yes Dixie Dials, MD  omega-3 acid ethyl esters (LOVAZA) 1 g capsule Take 1 capsule (1 g total) by mouth 2 (two) times daily. 10/21/15  Yes Nicholes Mango, MD  Oxcarbazepine (TRILEPTAL) 300 MG tablet Take 300-600 mg by mouth 2 (two) times daily. Pt takes one tablet in the morning and two at night.   Yes Historical Provider, MD  oxyCODONE (OXY IR/ROXICODONE) 5 MG immediate release tablet Take 5 mg by mouth 3 (three) times daily as needed for severe pain.    Yes Historical Provider, MD  OxyCODONE (OXYCONTIN) 20 mg T12A 12 hr tablet Take 20 mg by mouth every 12 (twelve) hours.    Yes Historical Provider, MD  pantoprazole (PROTONIX) 40 MG tablet Take 40 mg by mouth daily.   Yes Historical Provider, MD  pregabalin (LYRICA) 200 MG capsule Take 200-400 mg by mouth 2 (two) times daily. Pt takes two capsules in the morning and one at night.   Yes Historical Provider, MD  tamsulosin (FLOMAX) 0.4 MG CAPS capsule Take 0.4 mg by mouth daily after supper.    Yes Historical Provider, MD    Inpatient Medications:  . aspirin EC   81 mg Oral Daily  . buPROPion  300 mg Oral Daily  . busPIRone  10 mg Oral QHS  . clopidogrel  75 mg Oral Daily  . DULoxetine  30 mg Oral QHS  . DULoxetine  60 mg Oral Daily  . [START ON 11/01/2015] Exenatide ER  2 mg Subcutaneous Weekly  . fenofibrate  160 mg Oral QPM  . fluticasone  2 spray Each Nare Daily  . heparin  5,000 Units Subcutaneous Q8H  . insulin aspart  0-9 Units Subcutaneous TID WC  . insulin glargine  60 Units Subcutaneous QHS  . loratadine  10 mg Oral Daily  . omega-3 acid ethyl esters  1 g Oral BID  . Oxcarbazepine  300 mg Oral  Daily  . OXcarbazepine  600 mg Oral QHS  . oxyCODONE  20 mg Oral Q12H  . pantoprazole  40 mg Oral Daily  . pregabalin  200 mg Oral QHS  . pregabalin  400 mg Oral Daily  . sodium chloride flush  3 mL Intravenous Q12H  . tamsulosin  0.4 mg Oral QPC supper      Allergies:  Allergies  Allergen Reactions  . Amoxicillin Shortness Of Breath, Itching and Other (See Comments)    Has patient had a PCN reaction causing immediate rash, facial/tongue/throat swelling, SOB or lightheadedness with hypotension: Yes Has patient had a PCN reaction causing severe rash involving mucus membranes or skin necrosis: No Has patient had a PCN reaction that required hospitalization No Has patient had a PCN reaction occurring within the last 10 years: No If all of the above answers are "NO", then may proceed with Cephalosporin use.  . Other Anaphylaxis, Itching and Other (See Comments)    Pt states that he is allergic to Endopa.  Reaction:  Anaphylaxis  Pt states that he is allergic to Metabisulfites Reaction:  Itching   . Penicillins Shortness Of Breath, Itching and Other (See Comments)    Has patient had a PCN reaction causing immediate rash, facial/tongue/throat swelling, SOB or lightheadedness with hypotension: Yes Has patient had a PCN reaction causing severe rash involving mucus membranes or skin necrosis: No Has patient had a PCN reaction that required  hospitalization No Has patient had a PCN reaction occurring within the last 10 years: No If all of the above answers are "NO", then may proceed with Cephalosporin use.  [name] Hydralazine Other (See Comments)    Reaction:  Cramping of extremities  . Crestor [Rosuvastatin Calcium] Other (See Comments)    Reaction:  Pt is unable to move arms/legs   . Metoprolol Nausea And Vomiting  . Statins Other (See Comments)    Reaction:  Pt is unable to move arms/legs  . Sulfa Antibiotics Itching  . Yellow Dyes (Non-Tartrazine) Itching  . Aspirin Itching and Other (See Comments)    Pt states that he is able to use in lower doses.    . Tape Rash    Social History   Social History  . Marital Status: Divorced    Spouse Name: N/A  . Number of Children: N/A  . Years of Education: N/A   Occupational History  . Not on file.   Social History Main Topics  . Smoking status: Former Smoker    Types: Cigarettes  . Smokeless tobacco: Not on file     Comment: quit 45 years  . Alcohol Use: 0.0 oz/week    0 Standard drinks or equivalent per week     Comment: occasionally  . Drug Use: No  . Sexual Activity: Not on file   Other Topics Concern  . Not on file   Social History Narrative     Family History  Problem Relation Age of Onset  . Breast cancer Mother   . Cancer Mother   . Hypertension Mother   . Lung cancer Father   . Hypertension Father   . Heart disease Father   . Cancer Father   . Kidney disease Sister   . Prostate cancer Neg Hx      Review of Systems Positive forSyncope Negative for: General:  chills, fever, night sweats or weight changes.  Cardiovascular: PND orthopnea positive for syncope dizziness  Dermatological skin lesions rashes Respiratory: Cough congestion Urologic: Frequent urination urination at night  and hematuria Abdominal: negative for nausea, vomiting, diarrhea, bright red blood per rectum, melena, or hematemesis Neurologic: negative for visual changes, and/or  hearing changes  All other systems reviewed and are otherwise negative except as noted above.  Labs:  Recent Labs  10/26/15 1533  TROPONINI <0.03   Lab Results  Component Value Date   WBC 4.2 10/27/2015   HGB 12.2* 10/27/2015   HCT 36.8* 10/27/2015   MCV 79.6* 10/27/2015   PLT 148* 10/27/2015    Recent Labs Lab 10/26/15 1533 10/27/15 0531  NA 137 139  K 3.7 3.2*  CL 102 104  CO2 25 27  BUN 25* 21*  CREATININE 1.33* 1.13  CALCIUM 9.2 8.7*  PROT 6.9  --   BILITOT 0.6  --   ALKPHOS 54  --   ALT 20  --   AST 21  --   GLUCOSE 267* 233*   Lab Results  Component Value Date   CHOL 223* 10/19/2015   HDL 32* 10/19/2015   LDLCALC UNABLE TO CALCULATE IF TRIGLYCERIDE OVER 400 mg/dL 10/19/2015   TRIG 600* 10/19/2015   No results found for: DDIMER  Radiology/Studies:  Dg Knee 1-2 Views Right  10/26/2015  CLINICAL DATA:  65 year old male with syncope and fall. Posterior right knee pain. Initial encounter. EXAM: RIGHT KNEE - 1-2 VIEW COMPARISON:  Right femur series  10/21/2015 FINDINGS: Long segment vascular stent extending from the right mid thigh to the popliteal fossa re- demonstrated and appears stable in configuration. Sip there tricompartmental degenerative changes at the right knee with a space loss and bulky osteophytosis. No definite joint effusion. No acute fracture or dislocation identified about the right knee. IMPRESSION: 1. No acute fracture or dislocation identified about the right knee. 2. Severe tricompartmental degenerative changes. 3. Long segment right lower extremity vascular stent appears stable. Electronically Signed   By: Genevie Ann M.D.   On: 10/26/2015 16:56   Ct Head Wo Contrast  10/26/2015  CLINICAL DATA:  Recent syncopal episode with posterior head injury, initial encounter EXAM: CT HEAD WITHOUT CONTRAST TECHNIQUE: Contiguous axial images were obtained from the base of the skull through the vertex without intravenous contrast. COMPARISON:  10/19/2015  FINDINGS: Bony calvarium is intact. Mild atrophic changes are noted. There is again seen an area of decreased attenuation within the deep white matter or/basal ganglia on the left somewhat smaller than that seen on the prior exam consistent with prior infarct. No findings to suggest acute hemorrhage, acute infarction or space-occupying mass lesion are noted. IMPRESSION: Changes consistent with prior lacunar infarct on the left as described. No acute abnormality is noted. Electronically Signed   By: Inez Catalina M.D.   On: 10/26/2015 16:53   Ct Head Wo Contrast  10/19/2015  CLINICAL DATA:  Weakness.  Fall. EXAM: CT HEAD WITHOUT CONTRAST TECHNIQUE: Contiguous axial images were obtained from the base of the skull through the vertex without intravenous contrast. COMPARISON:  12/18/2014 head CT. FINDINGS: There is a relatively well marginated low-attenuation focus in the left frontal corona radiata extending into the left putamen, new since 12/18/2014. No significant mass-effect. No evidence of parenchymal hemorrhage or extra-axial fluid collection. No midline shift. Intracranial atherosclerosis. Cerebral volume is age appropriate. No ventriculomegaly. The visualized paranasal sinuses are essentially clear. The mastoid air cells are unopacified. No evidence of calvarial fracture. IMPRESSION: Relatively well marginated low-attenuation focus in the left frontal corona radiata/left basal ganglia, new since 12/18/2014, most suggestive of a subacute infarct. No significant mass effect. No acute  intracranial hemorrhage. These results were called by telephone at the time of interpretation on 10/19/2015 at 5:27 pm to [place], who verbally acknowledged these results. Electronically Signed   By: Ilona Sorrel M.D.   On: 10/19/2015 17:33   US Carotid Bilateral  10/20/2015  CLINICAL DATA:  CVA, slurred speech, weakness, hyperlipidemia. EXAM: BILATERAL CAROTID DUPLEX ULTRASOUND TECHNIQUE: Pearline Cables scale imaging, color Doppler  and duplex ultrasound were performed of bilateral carotid and vertebral arteries in the neck. COMPARISON:  None. FINDINGS: Criteria: Quantification of carotid stenosis is based on velocity parameters that correlate the residual internal carotid diameter with NASCET-based stenosis levels, using the diameter of the distal internal carotid lumen as the denominator for stenosis measurement. The following velocity measurements were obtained: RIGHT ICA:  48/15 cm/sec CCA:  [id number] cm/sec SYSTOLIC ICA/CCA RATIO:  0.5 DIASTOLIC ICA/CCA RATIO:  1.2 ECA:  67 cm/sec LEFT ICA:  48/15 cm/sec CCA:  0000000 cm/sec SYSTOLIC ICA/CCA RATIO:  0.6 DIASTOLIC ICA/CCA RATIO:  1.6 ECA:  60 cm/sec RIGHT CAROTID ARTERY: Minor echogenic shadowing plaque formation. No hemodynamically significant right ICA stenosis, velocity elevation, or turbulent flow. Degree of narrowing less than 50%. RIGHT VERTEBRAL ARTERY:  Antegrade LEFT CAROTID ARTERY: Similar scattered minor echogenic plaque formation. No hemodynamically significant left ICA stenosis, velocity elevation, or turbulent flow. LEFT VERTEBRAL ARTERY:  Antegrade IMPRESSION: Minor carotid atherosclerosis. No hemodynamically significant ICA stenosis. Degree of narrowing less than 50% bilaterally. Patent antegrade vertebral flow bilaterally. Electronically Signed   By: Jerilynn Mages.  Shick M.D.   On: 10/20/2015 10:25   Dg Chest Portable 1 View  10/26/2015  CLINICAL DATA:  65 year old male with syncope. Initial encounter. EXAM: PORTABLE CHEST 1 VIEW COMPARISON:  12/18/2014 and earlier. FINDINGS: Seated AP view of the chest. Large body habitus. Allowing for AP technique cardiac and mediastinal contours are within normal limits. Visualized tracheal air column is within normal limits. No pneumothorax, pulmonary edema, pleural effusion or confluent pulmonary opacity. IMPRESSION: No acute cardiopulmonary abnormality. Electronically Signed   By: Genevie Ann M.D.   On: 10/26/2015 16:54   Dg Hip Unilat  With Pelvis  2-3 Views Right  10/19/2015  CLINICAL DATA:  Fall 2 days ago from standing position with right hip pain, initial encounter EXAM: DG HIP (WITH OR WITHOUT PELVIS) 2-3V RIGHT COMPARISON:  None. FINDINGS: Mild degenerative changes in the lumbar spine are seen. The pelvic ring is intact. No acute fracture or dislocation is noted. A stimulating device is noted over the left iliac wing. IMPRESSION: No acute abnormality noted. Electronically Signed   By: Inez Catalina M.D.   On: 10/19/2015 17:38   Dg Femur, Min 2 Views Right  10/21/2015  CLINICAL DATA:  Pain EXAM: RIGHT FEMUR 2 VIEWS COMPARISON:  None. FINDINGS: Frontal and lateral views were obtained. There is no demonstrable fracture or dislocation. No abnormal periosteal reaction. There is moderate osteoarthritic change the right hip and knee joint regions. No knee joint effusion evident. A stent is noted in the superficial femoral artery and portion of popliteal artery on the right. IMPRESSION: Osteoarthritic change in the knee and hip joints, more severe in the knee region. Vascular stent present. No fracture or dislocation. No erosive change or bony destruction. No abnormal periosteal reaction. Electronically Signed   By: Lowella Grip III M.D.   On: 10/21/2015 12:30    EKG: Sinus bradycardia with conduction defect and left atrial enlargement  Weights: Filed Weights   10/26/15 1541 10/26/15 2112  Weight: 295 lb (133.811 kg) 301 lb  11.2 oz (136.85 kg)     Physical Exam: Blood pressure 129/81, pulse 59, temperature 98 F (36.7 C), temperature source Oral, resp. rate 17, height 5' 8.5" (1.74 m), weight 301 lb 11.2 oz (136.85 kg), SpO2 96 %. Body mass index is 45.2 kg/(m^2). General: Well developed, well nourished, in no acute distress. Head eyes ears nose throat: Normocephalic, atraumatic, sclera non-icteric, no xanthomas, nares are without discharge. No apparent thyromegaly and/or mass  Lungs: Normal respiratory effort.  no wheezes, no rales, no  rhonchi.  Heart: RRR with normal S1 S2. no murmur gallop, no rub, PMI is normal size and placement, carotid upstroke normal without bruit, jugular venous pressure is normal Abdomen: Soft, non-tender, non-distended with normoactive bowel sounds. No hepatomegaly. No rebound/guarding. No obvious abdominal masses. Abdominal aorta is normal size without bruit Extremities: No edema. no cyanosis, no clubbing, no ulcers  Peripheral : 2+ bilateral upper extremity pulses, 2+ bilateral femoral pulses, 2+ bilateral dorsal pedal pulse Neuro: Alert and oriented. No facial asymmetry. No focal deficit. Moves all extremities spontaneously. Musculoskeletal: Normal muscle tone without kyphosis Psych:  Responds to questions appropriately with a normal affect.    Assessment: 65 year old male with essential hypertension mixed hyperlipidemia COPD with sleep apnea and coronary artery disease status post previous stenting without evidence of heart failure and or myocardial infarction having symptomatic bradycardia and syncope  Plan: 1. Continue serial ECG and enzymes to assess for possible myocardial infarction and or cause of presyncope 2. Avoid beta blockers or other medication management that could slow the heart rate down 3. Begin ambulation and follow for worsening symptoms of symptomatic bradycardia and consider the possibility of ambulation to assess for chronotropic incompetence which also will appears to be an issue 4. Continue other hypertension control with calcium channel blocker as necessary 5. Further consideration of pacemaker placement if patient has sinus node dysfunction causing significant symptoms at this time   Signed, Corey Skains M.D. [facility] 10/27/2015, 8:26 AM

## 2015-10-28 DIAGNOSIS — Z79899 Other long term (current) drug therapy: Secondary | ICD-10-CM | POA: Diagnosis not present

## 2015-10-28 DIAGNOSIS — I4589 Other specified conduction disorders: Secondary | ICD-10-CM | POA: Diagnosis present

## 2015-10-28 DIAGNOSIS — Z888 Allergy status to other drugs, medicaments and biological substances status: Secondary | ICD-10-CM | POA: Diagnosis not present

## 2015-10-28 DIAGNOSIS — I10 Essential (primary) hypertension: Secondary | ICD-10-CM | POA: Diagnosis present

## 2015-10-28 DIAGNOSIS — E782 Mixed hyperlipidemia: Secondary | ICD-10-CM | POA: Diagnosis present

## 2015-10-28 DIAGNOSIS — Z803 Family history of malignant neoplasm of breast: Secondary | ICD-10-CM | POA: Diagnosis not present

## 2015-10-28 DIAGNOSIS — I495 Sick sinus syndrome: Secondary | ICD-10-CM | POA: Diagnosis present

## 2015-10-28 DIAGNOSIS — G4733 Obstructive sleep apnea (adult) (pediatric): Secondary | ICD-10-CM | POA: Diagnosis present

## 2015-10-28 DIAGNOSIS — Z841 Family history of disorders of kidney and ureter: Secondary | ICD-10-CM | POA: Diagnosis not present

## 2015-10-28 DIAGNOSIS — R55 Syncope and collapse: Secondary | ICD-10-CM | POA: Diagnosis present

## 2015-10-28 DIAGNOSIS — Z8673 Personal history of transient ischemic attack (TIA), and cerebral infarction without residual deficits: Secondary | ICD-10-CM | POA: Diagnosis not present

## 2015-10-28 DIAGNOSIS — Z955 Presence of coronary angioplasty implant and graft: Secondary | ICD-10-CM | POA: Diagnosis not present

## 2015-10-28 DIAGNOSIS — Z7951 Long term (current) use of inhaled steroids: Secondary | ICD-10-CM | POA: Diagnosis not present

## 2015-10-28 DIAGNOSIS — Z794 Long term (current) use of insulin: Secondary | ICD-10-CM | POA: Diagnosis not present

## 2015-10-28 DIAGNOSIS — Z6841 Body Mass Index (BMI) 40.0 and over, adult: Secondary | ICD-10-CM | POA: Diagnosis not present

## 2015-10-28 DIAGNOSIS — Z7982 Long term (current) use of aspirin: Secondary | ICD-10-CM | POA: Diagnosis not present

## 2015-10-28 DIAGNOSIS — K219 Gastro-esophageal reflux disease without esophagitis: Secondary | ICD-10-CM | POA: Diagnosis present

## 2015-10-28 DIAGNOSIS — M2040 Other hammer toe(s) (acquired), unspecified foot: Secondary | ICD-10-CM | POA: Diagnosis present

## 2015-10-28 DIAGNOSIS — Z85828 Personal history of other malignant neoplasm of skin: Secondary | ICD-10-CM | POA: Diagnosis not present

## 2015-10-28 DIAGNOSIS — Z89421 Acquired absence of other right toe(s): Secondary | ICD-10-CM | POA: Diagnosis not present

## 2015-10-28 DIAGNOSIS — Z880 Allergy status to penicillin: Secondary | ICD-10-CM | POA: Diagnosis not present

## 2015-10-28 DIAGNOSIS — Z8249 Family history of ischemic heart disease and other diseases of the circulatory system: Secondary | ICD-10-CM | POA: Diagnosis not present

## 2015-10-28 DIAGNOSIS — Z79891 Long term (current) use of opiate analgesic: Secondary | ICD-10-CM | POA: Diagnosis not present

## 2015-10-28 DIAGNOSIS — Z87891 Personal history of nicotine dependence: Secondary | ICD-10-CM | POA: Diagnosis not present

## 2015-10-28 DIAGNOSIS — M549 Dorsalgia, unspecified: Secondary | ICD-10-CM | POA: Diagnosis present

## 2015-10-28 DIAGNOSIS — L89629 Pressure ulcer of left heel, unspecified stage: Secondary | ICD-10-CM | POA: Diagnosis present

## 2015-10-28 DIAGNOSIS — Z882 Allergy status to sulfonamides status: Secondary | ICD-10-CM | POA: Diagnosis not present

## 2015-10-28 DIAGNOSIS — I252 Old myocardial infarction: Secondary | ICD-10-CM | POA: Diagnosis not present

## 2015-10-28 DIAGNOSIS — Z801 Family history of malignant neoplasm of trachea, bronchus and lung: Secondary | ICD-10-CM | POA: Diagnosis not present

## 2015-10-28 DIAGNOSIS — F329 Major depressive disorder, single episode, unspecified: Secondary | ICD-10-CM | POA: Diagnosis present

## 2015-10-28 DIAGNOSIS — I2510 Atherosclerotic heart disease of native coronary artery without angina pectoris: Secondary | ICD-10-CM | POA: Diagnosis present

## 2015-10-28 DIAGNOSIS — Z9889 Other specified postprocedural states: Secondary | ICD-10-CM | POA: Diagnosis not present

## 2015-10-28 DIAGNOSIS — G8929 Other chronic pain: Secondary | ICD-10-CM | POA: Diagnosis present

## 2015-10-28 DIAGNOSIS — E1165 Type 2 diabetes mellitus with hyperglycemia: Secondary | ICD-10-CM | POA: Diagnosis present

## 2015-10-28 DIAGNOSIS — J449 Chronic obstructive pulmonary disease, unspecified: Secondary | ICD-10-CM | POA: Diagnosis present

## 2015-10-28 DIAGNOSIS — N400 Enlarged prostate without lower urinary tract symptoms: Secondary | ICD-10-CM | POA: Diagnosis present

## 2015-10-28 DIAGNOSIS — E876 Hypokalemia: Secondary | ICD-10-CM | POA: Diagnosis present

## 2015-10-28 DIAGNOSIS — Z950 Presence of cardiac pacemaker: Secondary | ICD-10-CM | POA: Diagnosis not present

## 2015-10-28 LAB — BASIC METABOLIC PANEL WITH GFR
Anion gap: 6 (ref 5–15)
BUN: 21 mg/dL — ABNORMAL HIGH (ref 6–20)
CO2: 29 mmol/L (ref 22–32)
Calcium: 8.9 mg/dL (ref 8.9–10.3)
Chloride: 105 mmol/L (ref 101–111)
Creatinine, Ser: 1.08 mg/dL (ref 0.61–1.24)
GFR calc Af Amer: >60 mL/min
GFR calc non Af Amer: >60 mL/min
Glucose, Bld: 174 mg/dL — ABNORMAL HIGH (ref 65–99)
Potassium: 3.7 mmol/L (ref 3.5–5.1)
Sodium: 140 mmol/L (ref 135–145)

## 2015-10-28 LAB — GLUCOSE, CAPILLARY
GLUCOSE-CAPILLARY: 286 mg/dL — AB (ref 65–99)
GLUCOSE-CAPILLARY: 221 mg/dL — AB (ref 65–99)
Glucose-Capillary: 161 mg/dL — ABNORMAL HIGH (ref 65–99)
Glucose-Capillary: 128 mg/dL — ABNORMAL HIGH (ref 65–99)

## 2015-10-28 LAB — MAGNESIUM: Magnesium: 1.7 mg/dL (ref 1.7–2.4)

## 2015-10-28 MED ORDER — SENNA 8.6 MG PO TABS
2.0000 | ORAL_TABLET | Freq: Every day | ORAL | Status: DC
  Administered 2015-10-28 – 2015-11-03 (×7): 17.2 mg via ORAL
  Filled 2015-10-28 (×7): qty 2

## 2015-10-28 MED ORDER — INSULIN ASPART 100 UNIT/ML ~~LOC~~ SOLN
5.0000 [IU] | Freq: Three times a day (TID) | SUBCUTANEOUS | Status: DC
  Administered 2015-10-28 – 2015-11-03 (×16): 5 [IU] via SUBCUTANEOUS
  Filled 2015-10-28 (×16): qty 5

## 2015-10-28 MED ORDER — MAGNESIUM SULFATE 2 GM/50ML IV SOLN
2.0000 g | Freq: Once | INTRAVENOUS | Status: AC
  Administered 2015-10-28: 2 g via INTRAVENOUS
  Filled 2015-10-28: qty 50

## 2015-10-28 NOTE — Progress Notes (Signed)
Patient ID: Victor Wise, male   DOB: 1950/10/26, 65 y.o.   MRN: [id number] [facility] PROGRESS NOTE  Victor Wise [id number] DOB: 1951-04-22 DOA: 10/26/2015 PCP: Lavera Guise, MD  HPI/Subjective: Patient feeling very washed out. Heart rate still low. Cardiology wrote in their note to set up pacemaker. No complaints of chest pain or shortness of breath.  Objective: Filed Vitals:   10/28/15 0508 10/28/15 0820  BP: 144/53 142/61  Pulse: 46 48  Temp: 97.6 F (36.4 C) 97.8 F (36.6 C)  Resp: 18 20    Filed Weights   10/26/15 1541 10/26/15 2112  Weight: 133.811 kg (295 lb) 136.85 kg (301 lb 11.2 oz)    ROS: Review of Systems  Constitutional: Negative for fever and chills.  Eyes: Negative for blurred vision.  Respiratory: Negative for cough and shortness of breath.   Cardiovascular: Negative for chest pain.  Gastrointestinal: Negative for nausea, vomiting, abdominal pain, diarrhea and constipation.  Genitourinary: Negative for dysuria.  Musculoskeletal: Negative for joint pain.  Neurological: Positive for weakness. Negative for dizziness and headaches.   Exam: Physical Exam  Constitutional: He is oriented to person, place, and time.  HENT:  Nose: No mucosal edema.  Mouth/Throat: No oropharyngeal exudate or posterior oropharyngeal edema.  Eyes: Conjunctivae, EOM and lids are normal. Pupils are equal, round, and reactive to light.  Neck: No JVD present. Carotid bruit is not present. No edema present. No thyroid mass and no thyromegaly present.  Cardiovascular: S1 normal and S2 normal.  Exam reveals no gallop.   No murmur heard. Pulses:      Dorsalis pedis pulses are 2+ on the right side, and 2+ on the left side.  Respiratory: No respiratory distress. He has no wheezes. He has no rhonchi. He has no rales.  GI: Soft. Bowel sounds are normal. There is no tenderness.  Musculoskeletal:       Right ankle: He exhibits swelling.       Left ankle: He exhibits swelling.   Lymphadenopathy:    He has no cervical adenopathy.  Neurological: He is alert and oriented to person, place, and time. No cranial nerve deficit.  Skin: Skin is warm. No rash noted. Nails show no clubbing.  Psychiatric: He has a normal mood and affect.      Data Reviewed: Basic Metabolic Panel:  Recent Labs Lab 10/26/15 1533 10/27/15 0531 10/28/15 0327  NA 137 139 140  K 3.7 3.2* 3.7  CL 102 104 105  CO2 25 27 29   GLUCOSE 267* 233* 174*  BUN 25* 21* 21*  CREATININE 1.33* 1.13 1.08  CALCIUM 9.2 8.7* 8.9  MG  --   --  1.7   Liver Function Tests:  Recent Labs Lab 10/26/15 1533  AST 21  ALT 20  ALKPHOS 54  BILITOT 0.6  PROT 6.9  ALBUMIN 4.2   CBC:  Recent Labs Lab 10/26/15 1533 10/27/15 0531  WBC 6.0 4.2  HGB 12.2* 12.2*  HCT 37.4* 36.8*  MCV 78.2* 79.6*  PLT 186 148*   Cardiac Enzymes:  Recent Labs Lab 10/26/15 1533  TROPONINI <0.03   BNP (last 3 results)  Recent Labs  10/26/15 1533  BNP 19.0     CBG:  Recent Labs Lab 10/27/15 0721 10/27/15 1116 10/27/15 1618 10/27/15 2101 10/28/15 0742  GLUCAP 192* 305* 249* 202* 161*    Recent Results (from the past 240 hour(s))  MRSA PCR Screening     Status: None   Collection Time: 10/26/15 10:32  PM  Result Value Ref Range Status   MRSA by PCR NEGATIVE NEGATIVE Final    Comment:        The GeneXpert MRSA Assay (FDA approved for NASAL specimens only), is one component of a comprehensive MRSA colonization surveillance program. It is not intended to diagnose MRSA infection nor to guide or monitor treatment for MRSA infections.      Studies: Dg Knee 1-2 Views Right  10/26/2015  CLINICAL DATA:  65 year old male with syncope and fall. Posterior right knee pain. Initial encounter. EXAM: RIGHT KNEE - 1-2 VIEW COMPARISON:  Right femur series  10/21/2015 FINDINGS: Long segment vascular stent extending from the right mid thigh to the popliteal fossa re- demonstrated and appears stable in  configuration. Sip there tricompartmental degenerative changes at the right knee with a space loss and bulky osteophytosis. No definite joint effusion. No acute fracture or dislocation identified about the right knee. IMPRESSION: 1. No acute fracture or dislocation identified about the right knee. 2. Severe tricompartmental degenerative changes. 3. Long segment right lower extremity vascular stent appears stable. Electronically Signed   By: Genevie Ann M.D.   On: 10/26/2015 16:56   Ct Head Wo Contrast  10/26/2015  CLINICAL DATA:  Recent syncopal episode with posterior head injury, initial encounter EXAM: CT HEAD WITHOUT CONTRAST TECHNIQUE: Contiguous axial images were obtained from the base of the skull through the vertex without intravenous contrast. COMPARISON:  10/19/2015 FINDINGS: Bony calvarium is intact. Mild atrophic changes are noted. There is again seen an area of decreased attenuation within the deep white matter or/basal ganglia on the left somewhat smaller than that seen on the prior exam consistent with prior infarct. No findings to suggest acute hemorrhage, acute infarction or space-occupying mass lesion are noted. IMPRESSION: Changes consistent with prior lacunar infarct on the left as described. No acute abnormality is noted. Electronically Signed   By: Inez Catalina M.D.   On: 10/26/2015 16:53   Dg Chest Portable 1 View  10/26/2015  CLINICAL DATA:  65 year old male with syncope. Initial encounter. EXAM: PORTABLE CHEST 1 VIEW COMPARISON:  12/18/2014 and earlier. FINDINGS: Seated AP view of the chest. Large body habitus. Allowing for AP technique cardiac and mediastinal contours are within normal limits. Visualized tracheal air column is within normal limits. No pneumothorax, pulmonary edema, pleural effusion or confluent pulmonary opacity. IMPRESSION: No acute cardiopulmonary abnormality. Electronically Signed   By: Genevie Ann M.D.   On: 10/26/2015 16:54    Scheduled Meds: . aspirin EC  81 mg Oral  Daily  . buPROPion  300 mg Oral Daily  . busPIRone  10 mg Oral QHS  . DULoxetine  30 mg Oral QHS  . DULoxetine  60 mg Oral Daily  . enoxaparin (LOVENOX) injection  40 mg Subcutaneous Q12H  . [START ON 11/01/2015] Exenatide ER  2 mg Subcutaneous Weekly  . fenofibrate  160 mg Oral QPM  . fluticasone  2 spray Each Nare Daily  . insulin aspart  0-9 Units Subcutaneous TID WC & HS  . insulin glargine  60 Units Subcutaneous QHS  . loratadine  10 mg Oral Daily  . omega-3 acid ethyl esters  1 g Oral BID  . Oxcarbazepine  300 mg Oral Daily  . OXcarbazepine  600 mg Oral QHS  . oxyCODONE  20 mg Oral Q12H  . pantoprazole  40 mg Oral Daily  . polyethylene glycol  17 g Oral Daily  . pregabalin  200 mg Oral QHS  . pregabalin  400  mg Oral Daily  . sodium chloride flush  3 mL Intravenous Q12H  . tamsulosin  0.4 mg Oral QPC supper    Assessment/Plan:  1. Sick sinus syndrome with Syncope and bradycardia. Cardiology recommends a pacemaker. Hopefully that can be set up soon. 2. Weakness. Physical therapy recommended rehabilitation.  3. Recent stroke. Patient on aspirin and Plavix being held. Patient has side effects with statins and unable to give. 4. Hypokalemia. Replaced potassium. Give magnesium today for hypomagnesemia. 5. Type 2 diabetes uncontrolled. Check a hemoglobin A1c continue sliding scale insulin and Lantus 6. Morbid obesity. Weight loss needed 7. BPH on Flomax 8. Depression. Continue psychiatric medications   Code Status:     Code Status Orders        Start     Ordered   10/26/15 2112  Full code   Continuous     10/26/15 2111    Code Status History    Date Active Date Inactive Code Status Order ID Comments User Context   10/19/2015 10:00 PM 10/21/2015 11:51 PM Full Code [id number]  Gladstone Lighter, MD Inpatient   12/19/2014  9:31 PM 12/21/2014  5:10 PM Full Code [id number]  Charolette Forward, MD Inpatient   12/19/2014  9:23 PM 12/19/2014  9:31 PM Full Code [id number]  Charolette Forward, MD  Inpatient   12/19/2014 12:38 PM 12/19/2014  5:18 PM Full Code [id number]  Dionisio Brylen, MD Inpatient   12/18/2014  6:36 PM 12/19/2014 12:38 PM Full Code [id number]  Henreitta Leber, MD Inpatient    Advance Directive Documentation        Most Recent Value   Type of Advance Directive  Living will   Pre-existing out of facility DNR order (yellow form or pink MOST form)     "MOST" Form in Place?       Family Communication: Spoke with daughter on phone yesterday Disposition Plan: Will need rehabilitation   Consultants:  Cardiology   Time spent: 22 minutes  [place], [place]

## 2015-10-28 NOTE — Progress Notes (Signed)
[facility] Encounter Note  Patient: Victor Wise / Admit Date: 10/26/2015 / Date of Encounter: 10/28/2015, 8:08 AM   Subjective: Patient is still weak and fatigued. No evidence of chest pain myocardial infarction and or congestive heart failure. Telemetry continues to show sinus bradycardia with some chronotropic incompetence when ambulating as well as concerns of significant sinus bradycardia into the 30 beat per minute range with ventricular escape rhythm. No syncope during this hospitalization  Review of Systems: Positive for: Weakness fatigue Negative for: Vision change, hearing change, syncope, dizziness, nausea, vomiting,diarrhea, bloody stool, stomach pain, cough, congestion, diaphoresis, urinary frequency, urinary pain,skin lesions, skin rashes Others previously listed  Objective: Telemetry: Sinus bradycardia and ventricular escape rhythm Physical Exam: Blood pressure 144/53, pulse 46, temperature 97.6 F (36.4 C), temperature source Oral, resp. rate 18, height 5' 8.5" (1.74 m), weight 301 lb 11.2 oz (136.85 kg), SpO2 100 %. Body mass index is 45.2 kg/(m^2). General: Well developed, well nourished, in no acute distress. Head: Normocephalic, atraumatic, sclera non-icteric, no xanthomas, nares are without discharge. Neck: No apparent masses Lungs: Normal respirations with no wheezes, no rhonchi, no rales , no crackles   Heart: Regular rate and rhythm, normal S1 S2, no murmur, no rub, no gallop, PMI is normal size and placement, carotid upstroke normal without bruit, jugular venous pressure normal Abdomen: Soft, non-tender, non-distended with normoactive bowel sounds. No hepatosplenomegaly. Abdominal aorta is normal size without bruit Extremities: No edema, no clubbing, no cyanosis, no ulcers,  Peripheral: 2+ radial, 2+ femoral, 2+ dorsal pedal pulses Neuro: Alert and oriented. Moves all extremities spontaneously. Psych:  Responds to questions appropriately with  a normal affect.   Intake/Output Summary (Last 24 hours) at 10/28/15 0808 Last data filed at 10/28/15 0509  Gross per 24 hour  Intake    483 ml  Output    500 ml  Net    -17 ml    Inpatient Medications:  . aspirin EC  81 mg Oral Daily  . buPROPion  300 mg Oral Daily  . busPIRone  10 mg Oral QHS  . DULoxetine  30 mg Oral QHS  . DULoxetine  60 mg Oral Daily  . enoxaparin (LOVENOX) injection  40 mg Subcutaneous Q12H  . [START ON 11/01/2015] Exenatide ER  2 mg Subcutaneous Weekly  . fenofibrate  160 mg Oral QPM  . fluticasone  2 spray Each Nare Daily  . insulin aspart  0-9 Units Subcutaneous TID WC & HS  . insulin glargine  60 Units Subcutaneous QHS  . loratadine  10 mg Oral Daily  . omega-3 acid ethyl esters  1 g Oral BID  . Oxcarbazepine  300 mg Oral Daily  . OXcarbazepine  600 mg Oral QHS  . oxyCODONE  20 mg Oral Q12H  . pantoprazole  40 mg Oral Daily  . polyethylene glycol  17 g Oral Daily  . pregabalin  200 mg Oral QHS  . pregabalin  400 mg Oral Daily  . sodium chloride flush  3 mL Intravenous Q12H  . tamsulosin  0.4 mg Oral QPC supper   Infusions:    Labs:  Recent Labs  10/27/15 0531 10/28/15 0327  NA 139 140  K 3.2* 3.7  CL 104 105  CO2 27 29  GLUCOSE 233* 174*  BUN 21* 21*  CREATININE 1.13 1.08  CALCIUM 8.7* 8.9  MG  --  1.7    Recent Labs  10/26/15 1533  AST 21  ALT 20  ALKPHOS 54  BILITOT  0.6  PROT 6.9  ALBUMIN 4.2    Recent Labs  10/26/15 1533 10/27/15 0531  WBC 6.0 4.2  HGB 12.2* 12.2*  HCT 37.4* 36.8*  MCV 78.2* 79.6*  PLT 186 148*    Recent Labs  10/26/15 1533  TROPONINI <0.03   Invalid input(s): POCBNP  Recent Labs  10/27/15 0531  HGBA1C 9.0*     Weights: Filed Weights   10/26/15 1541 10/26/15 2112  Weight: 295 lb (133.811 kg) 301 lb 11.2 oz (136.85 kg)     Radiology/Studies:  Dg Knee 1-2 Views Right  10/26/2015  CLINICAL DATA:  65 year old male with syncope and fall. Posterior right knee pain. Initial  encounter. EXAM: RIGHT KNEE - 1-2 VIEW COMPARISON:  Right femur series  10/21/2015 FINDINGS: Long segment vascular stent extending from the right mid thigh to the popliteal fossa re- demonstrated and appears stable in configuration. Sip there tricompartmental degenerative changes at the right knee with a space loss and bulky osteophytosis. No definite joint effusion. No acute fracture or dislocation identified about the right knee. IMPRESSION: 1. No acute fracture or dislocation identified about the right knee. 2. Severe tricompartmental degenerative changes. 3. Long segment right lower extremity vascular stent appears stable. Electronically Signed   By: Genevie Ann M.D.   On: 10/26/2015 16:56   Ct Head Wo Contrast  10/26/2015  CLINICAL DATA:  Recent syncopal episode with posterior head injury, initial encounter EXAM: CT HEAD WITHOUT CONTRAST TECHNIQUE: Contiguous axial images were obtained from the base of the skull through the vertex without intravenous contrast. COMPARISON:  10/19/2015 FINDINGS: Bony calvarium is intact. Mild atrophic changes are noted. There is again seen an area of decreased attenuation within the deep white matter or/basal ganglia on the left somewhat smaller than that seen on the prior exam consistent with prior infarct. No findings to suggest acute hemorrhage, acute infarction or space-occupying mass lesion are noted. IMPRESSION: Changes consistent with prior lacunar infarct on the left as described. No acute abnormality is noted. Electronically Signed   By: Inez Catalina M.D.   On: 10/26/2015 16:53   Ct Head Wo Contrast  10/19/2015  CLINICAL DATA:  Weakness.  Fall. EXAM: CT HEAD WITHOUT CONTRAST TECHNIQUE: Contiguous axial images were obtained from the base of the skull through the vertex without intravenous contrast. COMPARISON:  12/18/2014 head CT. FINDINGS: There is a relatively well marginated low-attenuation focus in the left frontal corona radiata extending into the left putamen, new  since 12/18/2014. No significant mass-effect. No evidence of parenchymal hemorrhage or extra-axial fluid collection. No midline shift. Intracranial atherosclerosis. Cerebral volume is age appropriate. No ventriculomegaly. The visualized paranasal sinuses are essentially clear. The mastoid air cells are unopacified. No evidence of calvarial fracture. IMPRESSION: Relatively well marginated low-attenuation focus in the left frontal corona radiata/left basal ganglia, new since 12/18/2014, most suggestive of a subacute infarct. No significant mass effect. No acute intracranial hemorrhage. These results were called by telephone at the time of interpretation on 10/19/2015 at 5:27 pm to [place], who verbally acknowledged these results. Electronically Signed   By: Ilona Sorrel M.D.   On: 10/19/2015 17:33   US Carotid Bilateral  10/20/2015  CLINICAL DATA:  CVA, slurred speech, weakness, hyperlipidemia. EXAM: BILATERAL CAROTID DUPLEX ULTRASOUND TECHNIQUE: Pearline Cables scale imaging, color Doppler and duplex ultrasound were performed of bilateral carotid and vertebral arteries in the neck. COMPARISON:  None. FINDINGS: Criteria: Quantification of carotid stenosis is based on velocity parameters that correlate the residual internal carotid diameter with NASCET-based  stenosis levels, using the diameter of the distal internal carotid lumen as the denominator for stenosis measurement. The following velocity measurements were obtained: RIGHT ICA:  48/15 cm/sec CCA:  [id number] cm/sec SYSTOLIC ICA/CCA RATIO:  0.5 DIASTOLIC ICA/CCA RATIO:  1.2 ECA:  67 cm/sec LEFT ICA:  48/15 cm/sec CCA:  0000000 cm/sec SYSTOLIC ICA/CCA RATIO:  0.6 DIASTOLIC ICA/CCA RATIO:  1.6 ECA:  60 cm/sec RIGHT CAROTID ARTERY: Minor echogenic shadowing plaque formation. No hemodynamically significant right ICA stenosis, velocity elevation, or turbulent flow. Degree of narrowing less than 50%. RIGHT VERTEBRAL ARTERY:  Antegrade LEFT CAROTID ARTERY: Similar scattered minor  echogenic plaque formation. No hemodynamically significant left ICA stenosis, velocity elevation, or turbulent flow. LEFT VERTEBRAL ARTERY:  Antegrade IMPRESSION: Minor carotid atherosclerosis. No hemodynamically significant ICA stenosis. Degree of narrowing less than 50% bilaterally. Patent antegrade vertebral flow bilaterally. Electronically Signed   By: Jerilynn Mages.  Shick M.D.   On: 10/20/2015 10:25   Dg Chest Portable 1 View  10/26/2015  CLINICAL DATA:  65 year old male with syncope. Initial encounter. EXAM: PORTABLE CHEST 1 VIEW COMPARISON:  12/18/2014 and earlier. FINDINGS: Seated AP view of the chest. Large body habitus. Allowing for AP technique cardiac and mediastinal contours are within normal limits. Visualized tracheal air column is within normal limits. No pneumothorax, pulmonary edema, pleural effusion or confluent pulmonary opacity. IMPRESSION: No acute cardiopulmonary abnormality. Electronically Signed   By: Genevie Ann M.D.   On: 10/26/2015 16:54   Dg Hip Unilat  With Pelvis 2-3 Views Right  10/19/2015  CLINICAL DATA:  Fall 2 days ago from standing position with right hip pain, initial encounter EXAM: DG HIP (WITH OR WITHOUT PELVIS) 2-3V RIGHT COMPARISON:  None. FINDINGS: Mild degenerative changes in the lumbar spine are seen. The pelvic ring is intact. No acute fracture or dislocation is noted. A stimulating device is noted over the left iliac wing. IMPRESSION: No acute abnormality noted. Electronically Signed   By: Inez Catalina M.D.   On: 10/19/2015 17:38   Dg Femur, Min 2 Views Right  10/21/2015  CLINICAL DATA:  Pain EXAM: RIGHT FEMUR 2 VIEWS COMPARISON:  None. FINDINGS: Frontal and lateral views were obtained. There is no demonstrable fracture or dislocation. No abnormal periosteal reaction. There is moderate osteoarthritic change the right hip and knee joint regions. No knee joint effusion evident. A stent is noted in the superficial femoral artery and portion of popliteal artery on the right.  IMPRESSION: Osteoarthritic change in the knee and hip joints, more severe in the knee region. Vascular stent present. No fracture or dislocation. No erosive change or bony destruction. No abnormal periosteal reaction. Electronically Signed   By: Lowella Grip III M.D.   On: 10/21/2015 12:30     Assessment and Recommendation  65 y.o. male with known essential hypertension and known sleep apnea with COPD with coronary artery disease status post previous stenting without evidence of myocardial infarction and or congestive heart failure having episode of syncope and multiple episodes of dizziness over the last several weeks to a month showing telemetry of sinus bradycardia and ventricular escape rhythms of 40 bpm 1. Continue to avoid any beta blocker or heart rate regulating medication management 2. Ambulation and following for potential for continued chronotropic incompetence 3. Proceed to dual-chamber pacemaker placement for symptomatic bradycardia with ventricular escape rhythm and syncope. Patient understands the risk and benefits of dual-chamber pacemaker placement. This includes the possibility of death stroke heart attack infection bleeding blood clot hemopericardium pneumothorax and reaction to medication management.  The patient is a low risk for general anesthesia  Signed, Serafina Royals M.D. FACC

## 2015-10-28 NOTE — Progress Notes (Signed)
Initial Nutrition Assessment  DOCUMENTATION CODES:   Morbid obesity  INTERVENTION:  -Monitor intake and cater to pt preferences -If unable to meet nutritional needs will add supplement   NUTRITION DIAGNOSIS:   Inadequate oral intake related to social / environmental circumstances as evidenced by per patient/family report.    GOAL:   Patient will meet greater than or equal to 90% of their needs    MONITOR:   PO intake  REASON FOR ASSESSMENT:   Malnutrition Screening Tool    ASSESSMENT:      Pt admitted with syncope, bradycardia.  Planning pacemaker  Past Medical History  Diagnosis Date  . Diabetes ([place])   . Hypertension   . BPH (benign prostatic hyperplasia)   . 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 Morbid obesity ([place])   . Obstructive sleep apnea   . Chronic back pain   . Depression   . UTI (lower urinary tract infection)   . Status post insertion of spinal cord stimulator   . Heart attack ([place])   . Stroke ([place])   . Basal cell carcinoma     forehead  . GERD (gastroesophageal reflux disease)   . Allergy     Pt reports decreased intake over the last week secondary to moving out of sisters house and in with dtr.  Stress from this has effected his appetite. Unable to clarify intake. Eating 100% of meals during admission and tolerating well.  Medications reviewed: aspart, lantus, omega 3, protonix, senna  Labs reviewed: BUN 21, creatinine WDL, glucose 174, FSBS 286, 161  Nutrition-Focused physical exam completed. Findings are WDL for fat depletion, muscle depletion, and edema.    Diet Order:  Diet heart healthy/carb modified Room service appropriate?: Yes; Fluid consistency:: Thin  Skin:  Reviewed, no issues  Last BM:  6/10  Height:   Ht Readings from Last 1 Encounters:  10/27/15 5' 8.5" (1.74 m)    Weight: Reports wt loss but unsure how much.  Noted on 6/5 wt of 320 pounds, measure wt on 6/12 301 lbs 11.2 oz.   (6% wt loss in the last week) (??  Accuracy of wt measurements)  Wt Readings from Last 1 Encounters:  10/26/15 301 lb 11.2 oz (136.85 kg)    Ideal Body Weight:     BMI:  Body mass index is 45.2 kg/(m^2).  Estimated Nutritional Needs:   Kcal:  2100 kcals/d  Protein:  105 g/d  Fluid:  >/= 2 L/d  EDUCATION NEEDS:   No education needs identified at this time  Victor Wise B. Zenia Resides, [place], [place] (pager) Weekend/On-Call pager [phone])

## 2015-10-28 NOTE — Care Management (Signed)
Case discussed with attending. Plan is pacemaker and disharge to SNF once medically stable. Pacemaker placement date unknown at this time.

## 2015-10-28 NOTE — Progress Notes (Signed)
Inpatient Diabetes Program Recommendations  AACE/ADA: New Consensus Statement on Inpatient Glycemic Control (2015)  Target Ranges:  Prepandial:   less than 140 mg/dL      Peak postprandial:   less than 180 mg/dL (1-2 hours)      Critically ill patients:  140 - 180 mg/dL  Results for TEMARION, HANLIN (MRN [id number]) as of 10/28/2015 11:39  Ref. Range 10/27/2015 07:21 10/27/2015 11:16 10/27/2015 16:18 10/27/2015 21:01 10/28/2015 07:42 10/28/2015 11:29  Glucose-Capillary Latest Ref Range: 65-99 mg/dL 192 (H) 305 (H) 249 (H) 202 (H) 161 (H) 286 (H)  Results for ADELINE, HUEBSCH (MRN [id number]) as of 10/28/2015 11:39  Ref. Range 10/27/2015 05:31  Hemoglobin A1C Latest Ref Range: 4.0-6.0 % 9.0 (H)    Review of Glycemic Control  Diabetes history: Type 2 Outpatient Diabetes medications: Bydureon 2mg  q Sunday, Novolin R 0-15 units tid with meals, Lantus 60 units qhs Current orders for Inpatient glycemic control: Bydureon 2mg  q Sunday, Novolog 0-9 unit ACHS, Lantus 60 units qhs  Inpatient Diabetes Program Recommendations: Correction (SSI): Please consider increasing Novolog correction scale to moderate scale. Insulin - Meal Coverage: Glucose ranged from 192-305 mg/dl on 10/27/15 and post prandial glucose has been consistently elevated. Please consider ordering Novolog 5 units TID with meals for meal coverage if patient eats at least 50% of meal.  Thanks, Barnie Alderman, RN, MSN, CDE Diabetes Coordinator Inpatient Diabetes Program [phone] (Team Pager from [place] to [place]) [phone] (AP office) [phone] [facility] office) [phone] [facility] office)

## 2015-10-29 LAB — GLUCOSE, CAPILLARY
GLUCOSE-CAPILLARY: 152 mg/dL — AB (ref 65–99)
GLUCOSE-CAPILLARY: 262 mg/dL — AB (ref 65–99)
GLUCOSE-CAPILLARY: 165 mg/dL — AB (ref 65–99)
Glucose-Capillary: 169 mg/dL — ABNORMAL HIGH (ref 65–99)

## 2015-10-29 MED ORDER — ACETAMINOPHEN 325 MG PO TABS: 650.0000 mg | ORAL_TABLET | Freq: Four times a day (QID) | ORAL | Status: DC | PRN

## 2015-10-29 MED ORDER — OXYCODONE HCL 5 MG PO TABS: 5.0000 mg | ORAL_TABLET | Freq: Three times a day (TID) | ORAL | Status: DC | PRN

## 2015-10-29 NOTE — Progress Notes (Signed)
Physical Therapy Treatment Patient Details Name: Victor Wise MRN: [id number] DOB: 12/09/1950 Today's Date: 10/29/2015    History of Present Illness 65 y/o male here s/o syncopal episode with fall.  He was in the hospital last week with acute CVA.  He was discharged home but has not been doing very well since that time.     PT Comments    Pt in bed asleep but awoke easily. Hesitantly agreed to exercises in room.  Seated and standing exercises as described below.  Pt fatigued with exercise and took several self initiated seated rest breaks during minimal standing tasks.  Pt reports feeling unsteady on his feet and ambulation was held for safety.  Bed mobility is independent with rail on left and right side of bed but is slow and challenging for pt.  HR at 58 at start of session and increased to 60's with activity.  Pt stated he is anticipating pacemaker placement on Monday.   Follow Up Recommendations  SNF     Equipment Recommendations  Rolling walker with 5" wheels    Recommendations for Other Services       Precautions / Restrictions Precautions Precautions: Fall Restrictions Weight Bearing Restrictions: No    Mobility  Bed Mobility Overal bed mobility: Modified Independent Bed Mobility: Supine to Sit;Sit to Supine     Supine to sit: Modified independent (Device/Increase time) (rails inc time)     General bed mobility comments: slow and laborous but able to do with rails  Transfers Overall transfer level: Needs assistance Equipment used: Rolling walker (2 wheeled) Transfers: Sit to/from Stand Sit to Stand: Min assist         General transfer comment: light cuing and hands on assist for safety rising to standing.  Pt is able to maintain balance with walker but is not confident or steady with this  Ambulation/Gait                 Stairs            Wheelchair Mobility    Modified Rankin (Stroke Patients Only)       Balance Overall balance  assessment: Needs assistance Sitting-balance support: Feet supported Sitting balance-Leahy Scale: Good     Standing balance support: Bilateral upper extremity supported Standing balance-Leahy Scale: Fair                      Cognition Arousal/Alertness: Lethargic Behavior During Therapy: Flat affect Overall Cognitive Status: Within Functional Limits for tasks assessed                      Exercises General Exercises - Lower Extremity Ankle Circles/Pumps: AROM;Both;20 reps;Seated Long Arc Quad: AROM;Both;15 reps;Seated Straight Leg Raises: AROM;Both;10 reps;Standing Hip Flexion/Marching: AROM;Both;Standing;20 reps Toe Raises: AROM;Both;10 reps;Standing Heel Raises: AROM;Both;10 reps;Standing    General Comments        Pertinent Vitals/Pain Pain Assessment: No/denies pain    Home Living                      Prior Function            PT Goals (current goals can now be found in the care plan section) Progress towards PT goals: Not progressing toward goals - comment (mobility by fatigue, working on strengthening goals.)    Frequency  Min 2X/week    PT Plan Current plan remains appropriate    Co-evaluation  End of Session Equipment Utilized During Treatment: Gait belt Activity Tolerance: Patient limited by fatigue Patient left: in bed;with call bell/phone within reach     Time: 0855-0910 PT Time Calculation (min) (ACUTE ONLY): 15 min  Charges:  $Therapeutic Exercise: 8-22 mins                    G Codes:      Chesley Noon, PTA 10/29/2015, 11:51 AM

## 2015-10-29 NOTE — Progress Notes (Signed)
Notified Dr. Marcille Blanco of idiovetricular rhythm.

## 2015-10-29 NOTE — Progress Notes (Signed)
Inpatient Diabetes Program Recommendations  AACE/ADA: New Consensus Statement on Inpatient Glycemic Control (2015)  Target Ranges:  Prepandial:   less than 140 mg/dL      Peak postprandial:   less than 180 mg/dL (1-2 hours)      Critically ill patients:  140 - 180 mg/dL   Lab Results  Component Value Date   GLUCAP 152* 10/29/2015   HGBA1C 9.0* 10/27/2015    Review of Glycemic Control  Results for Victor Wise, Victor Wise (MRN [id number]) as of 10/29/2015 10:32  Ref. Range 10/28/2015 07:42 10/28/2015 11:29 10/28/2015 16:53 10/28/2015 20:41 10/29/2015 07:16  Glucose-Capillary Latest Ref Range: 65-99 mg/dL 161 (H) 286 (H) 221 (H) 128 (H) 152 (H)     Diabetes history: Type 2 Outpatient Diabetes medications: Bydureon 2mg  q Sunday, Novolin R 0-15 units tid with meals, Lantus 60 units qhs Current orders for Inpatient glycemic control: Bydureon 2mg  q Sunday, Novolog 0-9 unit ACHS, Lantus 60 units qhs, Novolog 5 units tid  Inpatient Diabetes Program Recommendations: Much improvement in post prandial blood sugars since Novolog 5 units tid has been added.  Agree with current diabetes medications.  Gentry Fitz, RN, BA, MHA, CDE Diabetes Coordinator Inpatient Diabetes Program  [phone] (Team Pager) [phone] ([place]) 10/29/2015 10:34 AM

## 2015-10-29 NOTE — Progress Notes (Signed)
[facility] Encounter Note  Patient: Victor Wise / Admit Date: 10/26/2015 / Date of Encounter: 10/29/2015, 8:14 AM   Subjective: Patient is still weak and fatigued. No evidence of chest pain myocardial infarction and or congestive heart failure. Telemetry continues to show sinus bradycardia with some chronotropic incompetence when ambulating as well as concerns of significant sinus bradycardia into the 30 beat per minute range with ventricular escape rhythm. No syncope during this hospitalization. Patient has had discontinuation of Plavix and will await to the metabolism over 4 days prior to elective pacemaker placement  Review of Systems: Positive for: Weakness fatigue Negative for: Vision change, hearing change, syncope, dizziness, nausea, vomiting,diarrhea, bloody stool, stomach pain, cough, congestion, diaphoresis, urinary frequency, urinary pain,skin lesions, skin rashes Others previously listed  Objective: Telemetry: Sinus bradycardia and ventricular escape rhythm Physical Exam: Blood pressure 136/58, pulse 51, temperature 98.2 F (36.8 C), temperature source Oral, resp. rate 18, height 5' 8.5" (1.74 m), weight 301 lb 11.2 oz (136.85 kg), SpO2 93 %. Body mass index is 45.2 kg/(m^2). General: Well developed, well nourished, in no acute distress. Head: Normocephalic, atraumatic, sclera non-icteric, no xanthomas, nares are without discharge. Neck: No apparent masses Lungs: Normal respirations with no wheezes, no rhonchi, no rales , no crackles   Heart: Regular rate and rhythm, normal S1 S2, no murmur, no rub, no gallop, PMI is normal size and placement, carotid upstroke normal without bruit, jugular venous pressure normal Abdomen: Soft, non-tender, non-distended with normoactive bowel sounds. No hepatosplenomegaly. Abdominal aorta is normal size without bruit Extremities: No edema, no clubbing, no cyanosis, no ulcers,  Peripheral: 2+ radial, 2+ femoral, 2+ dorsal  pedal pulses Neuro: Alert and oriented. Moves all extremities spontaneously. Psych:  Responds to questions appropriately with a normal affect.   Intake/Output Summary (Last 24 hours) at 10/29/15 0814 Last data filed at 10/29/15 0700  Gross per 24 hour  Intake    630 ml  Output   1000 ml  Net   -370 ml    Inpatient Medications:  . aspirin EC  81 mg Oral Daily  . buPROPion  300 mg Oral Daily  . busPIRone  10 mg Oral QHS  . DULoxetine  30 mg Oral QHS  . DULoxetine  60 mg Oral Daily  . enoxaparin (LOVENOX) injection  40 mg Subcutaneous Q12H  . [START ON 11/01/2015] Exenatide ER  2 mg Subcutaneous Weekly  . fenofibrate  160 mg Oral QPM  . fluticasone  2 spray Each Nare Daily  . insulin aspart  0-9 Units Subcutaneous TID WC & HS  . insulin aspart  5 Units Subcutaneous TID WC  . insulin glargine  60 Units Subcutaneous QHS  . loratadine  10 mg Oral Daily  . omega-3 acid ethyl esters  1 g Oral BID  . Oxcarbazepine  300 mg Oral Daily  . OXcarbazepine  600 mg Oral QHS  . oxyCODONE  20 mg Oral Q12H  . pantoprazole  40 mg Oral Daily  . pregabalin  200 mg Oral QHS  . pregabalin  400 mg Oral Daily  . senna  2 tablet Oral Daily  . sodium chloride flush  3 mL Intravenous Q12H  . tamsulosin  0.4 mg Oral QPC supper   Infusions:    Labs:  Recent Labs  10/27/15 0531 10/28/15 0327  NA 139 140  K 3.2* 3.7  CL 104 105  CO2 27 29  GLUCOSE 233* 174*  BUN 21* 21*  CREATININE 1.13 1.08  CALCIUM  8.7* 8.9  MG  --  1.7    Recent Labs  10/26/15 1533  AST 21  ALT 20  ALKPHOS 54  BILITOT 0.6  PROT 6.9  ALBUMIN 4.2    Recent Labs  10/26/15 1533 10/27/15 0531  WBC 6.0 4.2  HGB 12.2* 12.2*  HCT 37.4* 36.8*  MCV 78.2* 79.6*  PLT 186 148*    Recent Labs  10/26/15 1533  TROPONINI <0.03   Invalid input(s): POCBNP  Recent Labs  10/27/15 0531  HGBA1C 9.0*     Weights: Filed Weights   10/26/15 1541 10/26/15 2112  Weight: 295 lb (133.811 kg) 301 lb 11.2 oz (136.85 kg)      Radiology/Studies:  Dg Knee 1-2 Views Right  10/26/2015  CLINICAL DATA:  65 year old male with syncope and fall. Posterior right knee pain. Initial encounter. EXAM: RIGHT KNEE - 1-2 VIEW COMPARISON:  Right femur series  10/21/2015 FINDINGS: Long segment vascular stent extending from the right mid thigh to the popliteal fossa re- demonstrated and appears stable in configuration. Sip there tricompartmental degenerative changes at the right knee with a space loss and bulky osteophytosis. No definite joint effusion. No acute fracture or dislocation identified about the right knee. IMPRESSION: 1. No acute fracture or dislocation identified about the right knee. 2. Severe tricompartmental degenerative changes. 3. Long segment right lower extremity vascular stent appears stable. Electronically Signed   By: Genevie Ann M.D.   On: 10/26/2015 16:56   Ct Head Wo Contrast  10/26/2015  CLINICAL DATA:  Recent syncopal episode with posterior head injury, initial encounter EXAM: CT HEAD WITHOUT CONTRAST TECHNIQUE: Contiguous axial images were obtained from the base of the skull through the vertex without intravenous contrast. COMPARISON:  10/19/2015 FINDINGS: Bony calvarium is intact. Mild atrophic changes are noted. There is again seen an area of decreased attenuation within the deep white matter or/basal ganglia on the left somewhat smaller than that seen on the prior exam consistent with prior infarct. No findings to suggest acute hemorrhage, acute infarction or space-occupying mass lesion are noted. IMPRESSION: Changes consistent with prior lacunar infarct on the left as described. No acute abnormality is noted. Electronically Signed   By: Inez Catalina M.D.   On: 10/26/2015 16:53   Ct Head Wo Contrast  10/19/2015  CLINICAL DATA:  Weakness.  Fall. EXAM: CT HEAD WITHOUT CONTRAST TECHNIQUE: Contiguous axial images were obtained from the base of the skull through the vertex without intravenous contrast. COMPARISON:   12/18/2014 head CT. FINDINGS: There is a relatively well marginated low-attenuation focus in the left frontal corona radiata extending into the left putamen, new since 12/18/2014. No significant mass-effect. No evidence of parenchymal hemorrhage or extra-axial fluid collection. No midline shift. Intracranial atherosclerosis. Cerebral volume is age appropriate. No ventriculomegaly. The visualized paranasal sinuses are essentially clear. The mastoid air cells are unopacified. No evidence of calvarial fracture. IMPRESSION: Relatively well marginated low-attenuation focus in the left frontal corona radiata/left basal ganglia, new since 12/18/2014, most suggestive of a subacute infarct. No significant mass effect. No acute intracranial hemorrhage. These results were called by telephone at the time of interpretation on 10/19/2015 at 5:27 pm to O'[place], who verbally acknowledged these results. Electronically Signed   By: Ilona Sorrel M.D.   On: 10/19/2015 17:33   US Carotid Bilateral  10/20/2015  CLINICAL DATA:  CVA, slurred speech, weakness, hyperlipidemia. EXAM: BILATERAL CAROTID DUPLEX ULTRASOUND TECHNIQUE: Pearline Cables scale imaging, color Doppler and duplex ultrasound were performed of bilateral carotid and vertebral  arteries in the neck. COMPARISON:  None. FINDINGS: Criteria: Quantification of carotid stenosis is based on velocity parameters that correlate the residual internal carotid diameter with NASCET-based stenosis levels, using the diameter of the distal internal carotid lumen as the denominator for stenosis measurement. The following velocity measurements were obtained: RIGHT ICA:  48/15 cm/sec CCA:  [id number] cm/sec SYSTOLIC ICA/CCA RATIO:  0.5 DIASTOLIC ICA/CCA RATIO:  1.2 ECA:  67 cm/sec LEFT ICA:  48/15 cm/sec CCA:  0000000 cm/sec SYSTOLIC ICA/CCA RATIO:  0.6 DIASTOLIC ICA/CCA RATIO:  1.6 ECA:  60 cm/sec RIGHT CAROTID ARTERY: Minor echogenic shadowing plaque formation. No hemodynamically significant right ICA  stenosis, velocity elevation, or turbulent flow. Degree of narrowing less than 50%. RIGHT VERTEBRAL ARTERY:  Antegrade LEFT CAROTID ARTERY: Similar scattered minor echogenic plaque formation. No hemodynamically significant left ICA stenosis, velocity elevation, or turbulent flow. LEFT VERTEBRAL ARTERY:  Antegrade IMPRESSION: Minor carotid atherosclerosis. No hemodynamically significant ICA stenosis. Degree of narrowing less than 50% bilaterally. Patent antegrade vertebral flow bilaterally. Electronically Signed   By: Jerilynn Mages.  Shick M.D.   On: 10/20/2015 10:25   Dg Chest Portable 1 View  10/26/2015  CLINICAL DATA:  65 year old male with syncope. Initial encounter. EXAM: PORTABLE CHEST 1 VIEW COMPARISON:  12/18/2014 and earlier. FINDINGS: Seated AP view of the chest. Large body habitus. Allowing for AP technique cardiac and mediastinal contours are within normal limits. Visualized tracheal air column is within normal limits. No pneumothorax, pulmonary edema, pleural effusion or confluent pulmonary opacity. IMPRESSION: No acute cardiopulmonary abnormality. Electronically Signed   By: Genevie Ann M.D.   On: 10/26/2015 16:54   Dg Hip Unilat  With Pelvis 2-3 Views Right  10/19/2015  CLINICAL DATA:  Fall 2 days ago from standing position with right hip pain, initial encounter EXAM: DG HIP (WITH OR WITHOUT PELVIS) 2-3V RIGHT COMPARISON:  None. FINDINGS: Mild degenerative changes in the lumbar spine are seen. The pelvic ring is intact. No acute fracture or dislocation is noted. A stimulating device is noted over the left iliac wing. IMPRESSION: No acute abnormality noted. Electronically Signed   By: Inez Catalina M.D.   On: 10/19/2015 17:38   Dg Femur, Min 2 Views Right  10/21/2015  CLINICAL DATA:  Pain EXAM: RIGHT FEMUR 2 VIEWS COMPARISON:  None. FINDINGS: Frontal and lateral views were obtained. There is no demonstrable fracture or dislocation. No abnormal periosteal reaction. There is moderate osteoarthritic change the right  hip and knee joint regions. No knee joint effusion evident. A stent is noted in the superficial femoral artery and portion of popliteal artery on the right. IMPRESSION: Osteoarthritic change in the knee and hip joints, more severe in the knee region. Vascular stent present. No fracture or dislocation. No erosive change or bony destruction. No abnormal periosteal reaction. Electronically Signed   By: Lowella Grip III M.D.   On: 10/21/2015 12:30     Assessment and Recommendation  65 y.o. male with known essential hypertension and known sleep apnea with COPD with coronary artery disease status post previous stenting without evidence of myocardial infarction and or congestive heart failure having episode of syncope and multiple episodes of dizziness over the last several weeks to a month showing telemetry of sinus bradycardia and ventricular escape rhythms of 40 bpm 1. Continue to avoid any beta blocker or heart rate regulating medication management 2. Ambulation and following for potential for continued chronotropic incompetence and follow for any other significant issues 3. Proceed to dual-chamber pacemaker placement for symptomatic bradycardia with ventricular  escape rhythm and syncope. Patient understands the risk and benefits of dual-chamber pacemaker placement. This includes the possibility of death stroke heart attack infection bleeding blood clot hemopericardium pneumothorax and reaction to medication management. The patient is a low risk for general anesthesia 4. Await metabolism of Plavix and scheduling of dual-chamber pacemaker placement on Monday  Signed, Serafina Royals M.D. FACC

## 2015-10-29 NOTE — Progress Notes (Signed)
Patient ID: Victor Wise, male   DOB: Jan 18, 1951, 65 y.o.   MRN: [id number] [facility] PROGRESS NOTE  Victor Wise [id number] DOB: 08/27/1950 DOA: 10/26/2015 PCP: Lavera Guise, MD  HPI/Subjective: Patient still feels weak and washed out. No chest pain or shortness of breath. Appetite are right  Objective: Filed Vitals:   10/29/15 0803 10/29/15 1109  BP: 136/58 136/48  Pulse: 51 55  Temp: 98.2 F (36.8 C) 98.5 F (36.9 C)  Resp: 18 19    Filed Weights   10/26/15 1541 10/26/15 2112  Weight: 133.811 kg (295 lb) 136.85 kg (301 lb 11.2 oz)    ROS: Review of Systems  Constitutional: Negative for fever and chills.  Eyes: Negative for blurred vision.  Respiratory: Negative for cough and shortness of breath.   Cardiovascular: Negative for chest pain.  Gastrointestinal: Negative for nausea, vomiting, abdominal pain, diarrhea and constipation.  Genitourinary: Negative for dysuria.  Musculoskeletal: Negative for joint pain.  Neurological: Positive for weakness. Negative for dizziness and headaches.   Exam: Physical Exam  Constitutional: He is oriented to person, place, and time.  HENT:  Nose: No mucosal edema.  Mouth/Throat: No oropharyngeal exudate or posterior oropharyngeal edema.  Eyes: Conjunctivae, EOM and lids are normal. Pupils are equal, round, and reactive to light.  Neck: No JVD present. Carotid bruit is not present. No edema present. No thyroid mass and no thyromegaly present.  Cardiovascular: S1 normal and S2 normal.  Exam reveals no gallop.   No murmur heard. Pulses:      Dorsalis pedis pulses are 2+ on the right side, and 2+ on the left side.  Respiratory: No respiratory distress. He has no wheezes. He has no rhonchi. He has no rales.  GI: Soft. Bowel sounds are normal. There is no tenderness.  Musculoskeletal:       Right ankle: He exhibits swelling.       Left ankle: He exhibits swelling.  Lymphadenopathy:    He has no cervical adenopathy.   Neurological: He is alert and oriented to person, place, and time. No cranial nerve deficit.  Skin: Skin is warm. No rash noted. Nails show no clubbing.  Left heel slight skin breakdown but no signs of infection. Left foot small area of darkened skin on the bottom of the foot. Right second toe hammertoe with a scab and slight erythema seen around it.  Psychiatric: He has a normal mood and affect.      Data Reviewed: Basic Metabolic Panel:  Recent Labs Lab 10/26/15 1533 10/27/15 0531 10/28/15 0327  NA 137 139 140  K 3.7 3.2* 3.7  CL 102 104 105  CO2 25 27 29   GLUCOSE 267* 233* 174*  BUN 25* 21* 21*  CREATININE 1.33* 1.13 1.08  CALCIUM 9.2 8.7* 8.9  MG  --   --  1.7   Liver Function Tests:  Recent Labs Lab 10/26/15 1533  AST 21  ALT 20  ALKPHOS 54  BILITOT 0.6  PROT 6.9  ALBUMIN 4.2   CBC:  Recent Labs Lab 10/26/15 1533 10/27/15 0531  WBC 6.0 4.2  HGB 12.2* 12.2*  HCT 37.4* 36.8*  MCV 78.2* 79.6*  PLT 186 148*   Cardiac Enzymes:  Recent Labs Lab 10/26/15 1533  TROPONINI <0.03   BNP (last 3 results)  Recent Labs  10/26/15 1533  BNP 19.0     CBG:  Recent Labs Lab 10/28/15 1129 10/28/15 1653 10/28/15 2041 10/29/15 0716 10/29/15 1110  GLUCAP 286* 221* 128* 152*  169*    Recent Results (from the past 240 hour(s))  MRSA PCR Screening     Status: None   Collection Time: 10/26/15 10:32 PM  Result Value Ref Range Status   MRSA by PCR NEGATIVE NEGATIVE Final    Comment:        The GeneXpert MRSA Assay (FDA approved for NASAL specimens only), is one component of a comprehensive MRSA colonization surveillance program. It is not intended to diagnose MRSA infection nor to guide or monitor treatment for MRSA infections.       Scheduled Meds: . aspirin EC  81 mg Oral Daily  . buPROPion  300 mg Oral Daily  . busPIRone  10 mg Oral QHS  . DULoxetine  30 mg Oral QHS  . DULoxetine  60 mg Oral Daily  . enoxaparin (LOVENOX) injection  40  mg Subcutaneous Q12H  . [START ON 11/01/2015] Exenatide ER  2 mg Subcutaneous Weekly  . fenofibrate  160 mg Oral QPM  . fluticasone  2 spray Each Nare Daily  . insulin aspart  0-9 Units Subcutaneous TID WC & HS  . insulin aspart  5 Units Subcutaneous TID WC  . insulin glargine  60 Units Subcutaneous QHS  . loratadine  10 mg Oral Daily  . omega-3 acid ethyl esters  1 g Oral BID  . Oxcarbazepine  300 mg Oral Daily  . OXcarbazepine  600 mg Oral QHS  . oxyCODONE  20 mg Oral Q12H  . pantoprazole  40 mg Oral Daily  . pregabalin  200 mg Oral QHS  . pregabalin  400 mg Oral Daily  . senna  2 tablet Oral Daily  . sodium chloride flush  3 mL Intravenous Q12H  . tamsulosin  0.4 mg Oral QPC supper    Assessment/Plan:  1. Sick sinus syndrome with Syncope and bradycardia. Cardiology recommends a pacemaker. Will be done on Monday. Plavix needs to be out of the system 2. Weakness. Physical therapy recommended rehabilitation.  3. Recent stroke. Patient on aspirin. Plavix being held. Patient has side effects with statins and unable to give. 4. Hypokalemia. Replaced potassium. Give magnesium today for hypomagnesemia. 5. Type 2 diabetes uncontrolled. Check a hemoglobin A1c continue sliding scale insulin and Lantus 6. Morbid obesity. Weight loss needed 7. BPH on Flomax 8. Depression. Continue psychiatric medications 9. Left heel decubiti present on admission. Heel protectors while in bed. Scab on second hammertoe will continue to monitor. Will try to hold off on antibiotics at this point.   Code Status:     Code Status Orders        Start     Ordered   10/26/15 2112  Full code   Continuous     10/26/15 2111    Code Status History    Date Active Date Inactive Code Status Order ID Comments User Context   10/19/2015 10:00 PM 10/21/2015 11:51 PM Full Code [id number]  Gladstone Lighter, MD Inpatient   12/19/2014  9:31 PM 12/21/2014  5:10 PM Full Code [id number]  Charolette Forward, MD Inpatient   12/19/2014  9:23  PM 12/19/2014  9:31 PM Full Code [id number]  Charolette Forward, MD Inpatient   12/19/2014 12:38 PM 12/19/2014  5:18 PM Full Code [id number]  Dionisio Victorio, MD Inpatient   12/18/2014  6:36 PM 12/19/2014 12:38 PM Full Code [id number]  Henreitta Leber, MD Inpatient    Advance Directive Documentation        Most Recent Value   Type of Advance Directive  Living will   Pre-existing out of facility DNR order (yellow form or pink MOST form)     "MOST" Form in Place?       Family Communication: Spoke with daughter on phone yesterday Disposition Plan: Will need rehabilitation on Monday or Tuesday  Consultants:  Cardiology   Time spent: 24 minutes  [place], [place]

## 2015-10-29 NOTE — Progress Notes (Signed)
CSW presented bed offers. Patient accepted bed offer at [facility]. CSW informed Marden Noble of above. CSW will continue to follow and assist.  Ernest Pine, MSW, [place] Work Department [phone]

## 2015-10-30 LAB — GLUCOSE, CAPILLARY
GLUCOSE-CAPILLARY: 162 mg/dL — AB (ref 65–99)
Glucose-Capillary: 167 mg/dL — ABNORMAL HIGH (ref 65–99)
Glucose-Capillary: 146 mg/dL — ABNORMAL HIGH (ref 65–99)
Glucose-Capillary: 133 mg/dL — ABNORMAL HIGH (ref 65–99)

## 2015-10-30 MED ORDER — PREGABALIN 50 MG PO CAPS
400.0000 mg | ORAL_CAPSULE | Freq: Every day | ORAL | Status: DC
  Administered 2015-10-30 – 2015-11-03 (×4): 400 mg via ORAL
  Filled 2015-10-30 (×4): qty 8

## 2015-10-30 NOTE — Progress Notes (Signed)
Physical Therapy Treatment Patient Details Name: Victor Wise MRN: [id number] DOB: July 13, 1950 Today's Date: 10/30/2015    History of Present Illness 65 y/o male here s/o syncopal episode with fall.  He was in the hospital last week with acute CVA.  He was discharged home but has not been doing very well since that time.     PT Comments    Pt in bed awake with family visiting.  Pt more engaged today during session with Probation officer and family.  To edge of bed with difficulty and rail but able to do without assist.  Pt stood and bedside for exercises as described below.  Pt had 2 seated rest breaks during session.  HR monitored and remained in 60's during session.  He declined ambulation due to fatigue but was able to sidestep to left to get high in bed before returning to supine.  Pacemaker insertion scheduled for Monday.    Follow Up Recommendations  SNF     Equipment Recommendations  Rolling walker with 5" wheels    Recommendations for Other Services       Precautions / Restrictions Precautions Precautions: Fall Restrictions Weight Bearing Restrictions: No    Mobility  Bed Mobility Overal bed mobility: Modified Independent Bed Mobility: Supine to Sit;Sit to Supine     Supine to sit: Modified independent (Device/Increase time) Sit to supine: Min guard   General bed mobility comments: slow and laborous but able to do with rails  Transfers Overall transfer level: Needs assistance Equipment used: Rolling walker (2 wheeled) Transfers: Sit to/from Stand Sit to Stand: Min assist         General transfer comment: light cuing and hands on assist for safety rising to standing.  Pt is able to maintain balance with walker but is not confident or steady with this  Ambulation/Gait Ambulation/Gait assistance: Min guard Ambulation Distance (Feet): 2 Feet (sidestepping) Assistive device: Rolling walker (2 wheeled)           Stairs            Wheelchair Mobility     Modified Rankin (Stroke Patients Only)       Balance Overall balance assessment: Needs assistance Sitting-balance support: Feet supported Sitting balance-Leahy Scale: Good     Standing balance support: Bilateral upper extremity supported Standing balance-Leahy Scale: Fair                      Cognition Arousal/Alertness: Awake/alert Behavior During Therapy: WFL for tasks assessed/performed Overall Cognitive Status: Within Functional Limits for tasks assessed                      Exercises General Exercises - Lower Extremity Ankle Circles/Pumps: AROM;Both;20 reps;Seated Long Arc Quad: AROM;Both;15 reps;Seated Straight Leg Raises: AROM;Both;20 reps;Standing Hip Flexion/Marching: AROM;Both;Standing;20 reps Toe Raises: AROM;Both;20 reps;Standing Heel Raises: AROM;Both;10 reps;Standing    General Comments        Pertinent Vitals/Pain Pain Assessment: No/denies pain    Home Living                      Prior Function            PT Goals (current goals can now be found in the care plan section)      Frequency  Min 2X/week    PT Plan Current plan remains appropriate    Co-evaluation             End of Session Equipment Utilized  During Treatment: Gait belt Activity Tolerance: Patient limited by fatigue Patient left: in bed;with call bell/phone within reach;with bed alarm set;with family/visitor present     Time: [id number] PT Time Calculation (min) (ACUTE ONLY): 17 min  Charges:  $Therapeutic Exercise: 8-22 mins                    G Codes:      Chesley Noon, PTA 10/30/2015, 12:14 PM

## 2015-10-30 NOTE — Progress Notes (Addendum)
CSW was stopped in the hall by patient's daughter. Reported that she discussed bed options with patient and wanted to inquire about [facility] bed availability. Reported that [facility] is closer to her home. CSW informed her that she'd look into it but as of today [facility] does not have any beds available. CSW contacted [facility] Admissions Coordinator at [facility] and requested he look at patent's referral. Per Victor Wise they may have beds available on Monday or Tuesday next week.   Per Victor Wise he'll be able to accept patient on Monday or Tuesday of next. Called patient's daughter, left voicemail. Patient accepted bed offer. CSW informed Victor Wise at [facility] that patient accepted. CSW will continue to follow and assist.  Victor Wise, MSW, [place] Work Department [phone]

## 2015-10-30 NOTE — Care Management Important Message (Signed)
Important Message  Patient Details  Name: Victor Wise MRN: [id number] Date of Birth: June 07, 1950   Medicare Important Message Given:  Yes    Juliann Pulse A Toluwanimi Radebaugh 10/30/2015, 1:27 PM

## 2015-10-30 NOTE — Progress Notes (Signed)
Patient ID: Victor Wise, male   DOB: January 30, 1951, 65 y.o.   MRN: [id number] [facility] PROGRESS NOTE  Victor Wise [id number] DOB: 12-22-1950 DOA: 10/26/2015 PCP: Lavera Guise, MD  HPI/Subjective: Patient wants to get this procedure over with. He still feels wiped out. No complaints of chest pain or shortness of breath.  Objective: Filed Vitals:   10/30/15 0943 10/30/15 1156  BP: 145/48 157/62  Pulse: 50 51  Temp: 97.9 F (36.6 C) 97.4 F (36.3 C)  Resp: 18 21    Filed Weights   10/26/15 1541 10/26/15 2112  Weight: 133.811 kg (295 lb) 136.85 kg (301 lb 11.2 oz)    ROS: Review of Systems  Constitutional: Negative for fever and chills.  Eyes: Negative for blurred vision.  Respiratory: Negative for cough and shortness of breath.   Cardiovascular: Negative for chest pain.  Gastrointestinal: Negative for nausea, vomiting, abdominal pain, diarrhea and constipation.  Genitourinary: Negative for dysuria.  Musculoskeletal: Negative for joint pain.  Neurological: Positive for weakness. Negative for dizziness and headaches.   Exam: Physical Exam  Constitutional: He is oriented to person, place, and time.  HENT:  Nose: No mucosal edema.  Mouth/Throat: No oropharyngeal exudate or posterior oropharyngeal edema.  Eyes: Conjunctivae, EOM and lids are normal. Pupils are equal, round, and reactive to light.  Neck: No JVD present. Carotid bruit is not present. No edema present. No thyroid mass and no thyromegaly present.  Cardiovascular: S1 normal and S2 normal.  Exam reveals no gallop.   No murmur heard. Pulses:      Dorsalis pedis pulses are 2+ on the right side, and 2+ on the left side.  Respiratory: No respiratory distress. He has no wheezes. He has no rhonchi. He has no rales.  GI: Soft. Bowel sounds are normal. There is no tenderness.  Musculoskeletal:       Right ankle: He exhibits swelling.       Left ankle: He exhibits swelling.  Lymphadenopathy:    He has no  cervical adenopathy.  Neurological: He is alert and oriented to person, place, and time. No cranial nerve deficit.  Skin: Skin is warm. No rash noted. Nails show no clubbing.  Left heel slight skin breakdown but no signs of infection. Left foot small area of darkened skin on the bottom of the foot. Right second toe hammertoe with a scab and slight erythema seen around it.  Psychiatric: He has a normal mood and affect.      Data Reviewed: Basic Metabolic Panel:  Recent Labs Lab 10/26/15 1533 10/27/15 0531 10/28/15 0327  NA 137 139 140  K 3.7 3.2* 3.7  CL 102 104 105  CO2 25 27 29   GLUCOSE 267* 233* 174*  BUN 25* 21* 21*  CREATININE 1.33* 1.13 1.08  CALCIUM 9.2 8.7* 8.9  MG  --   --  1.7   Liver Function Tests:  Recent Labs Lab 10/26/15 1533  AST 21  ALT 20  ALKPHOS 54  BILITOT 0.6  PROT 6.9  ALBUMIN 4.2   CBC:  Recent Labs Lab 10/26/15 1533 10/27/15 0531  WBC 6.0 4.2  HGB 12.2* 12.2*  HCT 37.4* 36.8*  MCV 78.2* 79.6*  PLT 186 148*   Cardiac Enzymes:  Recent Labs Lab 10/26/15 1533  TROPONINI <0.03   BNP (last 3 results)  Recent Labs  10/26/15 1533  BNP 19.0     CBG:  Recent Labs Lab 10/29/15 1110 10/29/15 1615 10/29/15 2049 10/30/15 0730 10/30/15 1152  GLUCAP 169* 262* 165* 167* 146*    Recent Results (from the past 240 hour(s))  MRSA PCR Screening     Status: None   Collection Time: 10/26/15 10:32 PM  Result Value Ref Range Status   MRSA by PCR NEGATIVE NEGATIVE Final    Comment:        The GeneXpert MRSA Assay (FDA approved for NASAL specimens only), is one component of a comprehensive MRSA colonization surveillance program. It is not intended to diagnose MRSA infection nor to guide or monitor treatment for MRSA infections.       Scheduled Meds: . aspirin EC  81 mg Oral Daily  . buPROPion  300 mg Oral Daily  . busPIRone  10 mg Oral QHS  . DULoxetine  30 mg Oral QHS  . DULoxetine  60 mg Oral Daily  . enoxaparin  (LOVENOX) injection  40 mg Subcutaneous Q12H  . fenofibrate  160 mg Oral QPM  . fluticasone  2 spray Each Nare Daily  . insulin aspart  0-9 Units Subcutaneous TID WC & HS  . insulin aspart  5 Units Subcutaneous TID WC  . insulin glargine  60 Units Subcutaneous QHS  . loratadine  10 mg Oral Daily  . omega-3 acid ethyl esters  1 g Oral BID  . Oxcarbazepine  300 mg Oral Daily  . OXcarbazepine  600 mg Oral QHS  . oxyCODONE  20 mg Oral Q12H  . pantoprazole  40 mg Oral Daily  . pregabalin  200 mg Oral QHS  . pregabalin  400 mg Oral Daily  . senna  2 tablet Oral Daily  . sodium chloride flush  3 mL Intravenous Q12H  . tamsulosin  0.4 mg Oral QPC supper    Assessment/Plan:  1. Sick sinus syndrome with Syncope and bradycardia. Cardiology recommends a pacemaker. Will be done on Monday. Plavix needs to be out of the system 2. Weakness. Physical therapy recommended rehabilitation.  3. Recent stroke. Patient on aspirin. Plavix being held. Patient has side effects with statins and unable to give. 4. Hypokalemia. hypomagnesemia.Replaced 5. Type 2 diabetes uncontrolled. Hemoglobin A1c 9. Continue sliding scale insulin and Lantus 6. Morbid obesity. Weight loss needed 7. BPH on Flomax 8. Depression. Continue psychiatric medications 9. Left heel decubiti present on admission. Heel protectors while in bed. Scab on second hammertoe will continue to monitor. Will try to hold off on antibiotics at this point.   Code Status:     Code Status Orders        Start     Ordered   10/26/15 2112  Full code   Continuous     10/26/15 2111    Code Status History    Date Active Date Inactive Code Status Order ID Comments User Context   10/19/2015 10:00 PM 10/21/2015 11:51 PM Full Code [id number]  Gladstone Lighter, MD Inpatient   12/19/2014  9:31 PM 12/21/2014  5:10 PM Full Code [id number]  Charolette Forward, MD Inpatient   12/19/2014  9:23 PM 12/19/2014  9:31 PM Full Code [id number]  Charolette Forward, MD Inpatient   12/19/2014  12:38 PM 12/19/2014  5:18 PM Full Code [id number]  Dionisio Zameer, MD Inpatient   12/18/2014  6:36 PM 12/19/2014 12:38 PM Full Code [id number]  Henreitta Leber, MD Inpatient    Advance Directive Documentation        Most Recent Value   Type of Advance Directive  Living will   Pre-existing out of facility DNR order (yellow form or pink  MOST form)     "MOST" Form in Place?       Disposition Plan: Will need rehabilitation on Monday or Tuesday  Consultants:  Cardiology   Time spent: 20 minutes  [place], [place]

## 2015-10-31 LAB — GLUCOSE, CAPILLARY
Glucose-Capillary: 144 mg/dL — ABNORMAL HIGH (ref 65–99)
Glucose-Capillary: 244 mg/dL — ABNORMAL HIGH (ref 65–99)
Glucose-Capillary: 128 mg/dL — ABNORMAL HIGH (ref 65–99)

## 2015-10-31 LAB — CREATININE, SERUM
CREATININE: 1.04 mg/dL (ref 0.61–1.24)
GFR calc non Af Amer: >60 mL/min (ref >60)

## 2015-10-31 NOTE — Progress Notes (Signed)
Patient ID: Victor Wise, male   DOB: 1951/04/19, 65 y.o.   MRN: [id number] [facility] PROGRESS NOTE  Victor Wise [id number] DOB: December 22, 1950 DOA: 10/26/2015 PCP: Lavera Guise, MD  HPI/Subjective: Patient is sleeping and snoring. No overnight events. Heart rate is still at 48.  Objective: Filed Vitals:   10/31/15 0442 10/31/15 0800  BP: 144/55 146/58  Pulse: 47 48  Temp: 97.8 F (36.6 C) 98.9 F (37.2 C)  Resp: 18 16    Filed Weights   10/26/15 1541 10/26/15 2112  Weight: 133.811 kg (295 lb) 136.85 kg (301 lb 11.2 oz)    ROS: Review of Systems  Constitutional: Negative for fever and chills.  Eyes: Negative for blurred vision.  Respiratory: Negative for cough and shortness of breath.   Cardiovascular: Negative for chest pain.  Gastrointestinal: Negative for nausea, vomiting, abdominal pain, diarrhea and constipation.  Genitourinary: Negative for dysuria.  Musculoskeletal: Negative for joint pain.  Neurological: Positive for weakness. Negative for dizziness and headaches.   Exam: Physical Exam  Constitutional: He is oriented to person, place, and time.  HENT:  Nose: No mucosal edema.  Mouth/Throat: No oropharyngeal exudate or posterior oropharyngeal edema.  Eyes: Conjunctivae, EOM and lids are normal. Pupils are equal, round, and reactive to light.  Neck: No JVD present. Carotid bruit is not present. No edema present. No thyroid mass and no thyromegaly present.  Cardiovascular: S1 normal and S2 normal.  Exam reveals no gallop.   No murmur heard. Pulses:      Dorsalis pedis pulses are 2+ on the right side, and 2+ on the left side.  Respiratory: No respiratory distress. He has no wheezes. He has no rhonchi. He has no rales.  GI: Soft. Bowel sounds are normal. There is no tenderness.  Musculoskeletal:       Right ankle: He exhibits swelling.       Left ankle: He exhibits swelling.  Lymphadenopathy:    He has no cervical adenopathy.  Neurological: He is alert  and oriented to person, place, and time. No cranial nerve deficit.  Skin: Skin is warm. No rash noted. Nails show no clubbing.  Left heel slight skin breakdown but no signs of infection. Left foot small area of darkened skin on the bottom of the foot. Right second toe hammertoe with a scab and slight erythema seen around it.  Psychiatric: He has a normal mood and affect.      Data Reviewed: Basic Metabolic Panel:  Recent Labs Lab 10/26/15 1533 10/27/15 0531 10/28/15 0327 10/31/15 0533  NA 137 139 140  --   K 3.7 3.2* 3.7  --   CL 102 104 105  --   CO2 25 27 29   --   GLUCOSE 267* 233* 174*  --   BUN 25* 21* 21*  --   CREATININE 1.33* 1.13 1.08 1.04  CALCIUM 9.2 8.7* 8.9  --   MG  --   --  1.7  --    Liver Function Tests:  Recent Labs Lab 10/26/15 1533  AST 21  ALT 20  ALKPHOS 54  BILITOT 0.6  PROT 6.9  ALBUMIN 4.2   CBC:  Recent Labs Lab 10/26/15 1533 10/27/15 0531  WBC 6.0 4.2  HGB 12.2* 12.2*  HCT 37.4* 36.8*  MCV 78.2* 79.6*  PLT 186 148*   Cardiac Enzymes:  Recent Labs Lab 10/26/15 1533  TROPONINI <0.03   BNP (last 3 results)  Recent Labs  10/26/15 1533  BNP 19.0  CBG:  Recent Labs Lab 10/30/15 0730 10/30/15 1152 10/30/15 1637 10/30/15 2051 10/31/15 0744  GLUCAP 167* 146* 162* 133* 144*    Recent Results (from the past 240 hour(s))  MRSA PCR Screening     Status: None   Collection Time: 10/26/15 10:32 PM  Result Value Ref Range Status   MRSA by PCR NEGATIVE NEGATIVE Final    Comment:        The GeneXpert MRSA Assay (FDA approved for NASAL specimens only), is one component of a comprehensive MRSA colonization surveillance program. It is not intended to diagnose MRSA infection nor to guide or monitor treatment for MRSA infections.       Scheduled Meds: . aspirin EC  81 mg Oral Daily  . buPROPion  300 mg Oral Daily  . busPIRone  10 mg Oral QHS  . DULoxetine  30 mg Oral QHS  . DULoxetine  60 mg Oral Daily  .  enoxaparin (LOVENOX) injection  40 mg Subcutaneous Q12H  . fenofibrate  160 mg Oral QPM  . fluticasone  2 spray Each Nare Daily  . insulin aspart  0-9 Units Subcutaneous TID WC & HS  . insulin aspart  5 Units Subcutaneous TID WC  . insulin glargine  60 Units Subcutaneous QHS  . loratadine  10 mg Oral Daily  . omega-3 acid ethyl esters  1 g Oral BID  . Oxcarbazepine  300 mg Oral Daily  . OXcarbazepine  600 mg Oral QHS  . oxyCODONE  20 mg Oral Q12H  . pantoprazole  40 mg Oral Daily  . pregabalin  200 mg Oral QHS  . pregabalin  400 mg Oral Daily  . senna  2 tablet Oral Daily  . sodium chloride flush  3 mL Intravenous Q12H  . tamsulosin  0.4 mg Oral QPC supper    Assessment/Plan:  1. Sick sinus syndrome with Syncope and bradycardia. Cardiology recommends a pacemaker. Will be done on Monday. Plavix needs to be out of the system 2. Weakness. Physical therapy recommended rehabilitation.  3. Recent stroke. Patient on aspirin. Plavix being held. Patient has side effects with statins and unable to give. 4. Hypokalemia. hypomagnesemia.Replaced 5. Type 2 diabetes uncontrolled. Hemoglobin A1c 9. Continue sliding scale insulin and Lantus 6. Morbid obesity. Weight loss needed 7. BPH on Flomax 8. Depression. Continue psychiatric medications 9. Left heel decubiti present on admission. Heel protectors while in bed.  Code Status:     Code Status Orders        Start     Ordered   10/26/15 2112  Full code   Continuous     10/26/15 2111    Code Status History    Date Active Date Inactive Code Status Order ID Comments User Context   10/19/2015 10:00 PM 10/21/2015 11:51 PM Full Code [id number]  Gladstone Lighter, MD Inpatient   12/19/2014  9:31 PM 12/21/2014  5:10 PM Full Code [id number]  Charolette Forward, MD Inpatient   12/19/2014  9:23 PM 12/19/2014  9:31 PM Full Code [id number]  Charolette Forward, MD Inpatient   12/19/2014 12:38 PM 12/19/2014  5:18 PM Full Code [id number]  Dionisio Messiyah, MD Inpatient   12/18/2014   6:36 PM 12/19/2014 12:38 PM Full Code [id number]  Henreitta Leber, MD Inpatient    Advance Directive Documentation        Most Recent Value   Type of Advance Directive  Living will   Pre-existing out of facility DNR order (yellow form or pink MOST form)     "  MOST" Form in Place?       Disposition Plan: Will need rehabilitation on Monday or Tuesday  Consultants:  Cardiology   Time spent: 20 minutes  [facility]

## 2015-11-01 LAB — GLUCOSE, CAPILLARY
GLUCOSE-CAPILLARY: 122 mg/dL — AB (ref 65–99)
GLUCOSE-CAPILLARY: 186 mg/dL — AB (ref 65–99)
Glucose-Capillary: 125 mg/dL — ABNORMAL HIGH (ref 65–99)
Glucose-Capillary: 153 mg/dL — ABNORMAL HIGH (ref 65–99)

## 2015-11-01 MED ORDER — SODIUM CHLORIDE 0.9 % IV SOLN: INTRAVENOUS | Status: DC

## 2015-11-01 MED ORDER — VANCOMYCIN HCL 10 G IV SOLR
1500.0000 mg | INTRAVENOUS | Status: AC
  Filled 2015-11-01: qty 1500

## 2015-11-01 MED ORDER — SODIUM CHLORIDE 0.9 % IR SOLN
80.0000 mg | Status: AC
  Filled 2015-11-01: qty 2

## 2015-11-01 MED ORDER — INSULIN GLARGINE 100 UNIT/ML ~~LOC~~ SOLN
30.0000 [IU] | Freq: Every day | SUBCUTANEOUS | Status: DC
  Administered 2015-11-01 – 2015-11-02 (×2): 30 [IU] via SUBCUTANEOUS
  Filled 2015-11-01 (×3): qty 0.3

## 2015-11-01 MED ORDER — SODIUM CHLORIDE 0.9 % IV SOLN
INTRAVENOUS | Status: DC
  Administered 2015-11-02: 12:00:00 via INTRAVENOUS

## 2015-11-01 NOTE — Progress Notes (Signed)
Patient ID: Victor Wise, male   DOB: 1951-03-13, 65 y.o.   MRN: [id number] [facility] PROGRESS NOTE  Victor Wise [id number] DOB: July 30, 1950 DOA: 10/26/2015 PCP: Lavera Guise, MD  HPI/Subjective:  Objective; had chest pain on the left side last night and resolved on its own. Patient denies any other complaints at this time. Eagerly waiting for pacemaker placement tomorrow.    Filed Vitals:   11/01/15 0410 11/01/15 0800  BP: 131/50 134/59  Pulse: 93 48  Temp: 98.2 F (36.8 C) 98.3 F (36.8 C)  Resp: 18 16    Filed Weights   10/26/15 1541 10/26/15 2112  Weight: 133.811 kg (295 lb) 136.85 kg (301 lb 11.2 oz)    ROS: Review of Systems  Constitutional: Negative for fever and chills.  Eyes: Negative for blurred vision.  Respiratory: Negative for cough and shortness of breath.   Cardiovascular: Negative for chest pain.  Gastrointestinal: Negative for nausea, vomiting, abdominal pain, diarrhea and constipation.  Genitourinary: Negative for dysuria.  Musculoskeletal: Negative for joint pain.  Neurological: Positive for weakness. Negative for dizziness and headaches.   Exam: Physical Exam  Constitutional: He is oriented to person, place, and time.  HENT:  Nose: No mucosal edema.  Mouth/Throat: No oropharyngeal exudate or posterior oropharyngeal edema.  Eyes: Conjunctivae, EOM and lids are normal. Pupils are equal, round, and reactive to light.  Neck: No JVD present. Carotid bruit is not present. No edema present. No thyroid mass and no thyromegaly present.  Cardiovascular: S1 normal and S2 normal.  Exam reveals no gallop.   No murmur heard. Pulses:      Dorsalis pedis pulses are 2+ on the right side, and 2+ on the left side.  Respiratory: No respiratory distress. He has no wheezes. He has no rhonchi. He has no rales.  GI: Soft. Bowel sounds are normal. There is no tenderness.  Musculoskeletal:       Right ankle: He exhibits swelling.       Left ankle: He exhibits  swelling.  Lymphadenopathy:    He has no cervical adenopathy.  Neurological: He is alert and oriented to person, place, and time. No cranial nerve deficit.  Skin: Skin is warm. No rash noted. Nails show no clubbing.  Left heel slight skin breakdown but no signs of infection. Left foot small area of darkened skin on the bottom of the foot. Right second toe hammertoe with a scab and slight erythema seen around it.  Psychiatric: He has a normal mood and affect.      Data Reviewed: Basic Metabolic Panel:  Recent Labs Lab 10/26/15 1533 10/27/15 0531 10/28/15 0327 10/31/15 0533  NA 137 139 140  --   K 3.7 3.2* 3.7  --   CL 102 104 105  --   CO2 25 27 29   --   GLUCOSE 267* 233* 174*  --   BUN 25* 21* 21*  --   CREATININE 1.33* 1.13 1.08 1.04  CALCIUM 9.2 8.7* 8.9  --   MG  --   --  1.7  --    Liver Function Tests:  Recent Labs Lab 10/26/15 1533  AST 21  ALT 20  ALKPHOS 54  BILITOT 0.6  PROT 6.9  ALBUMIN 4.2   CBC:  Recent Labs Lab 10/26/15 1533 10/27/15 0531  WBC 6.0 4.2  HGB 12.2* 12.2*  HCT 37.4* 36.8*  MCV 78.2* 79.6*  PLT 186 148*   Cardiac Enzymes:  Recent Labs Lab 10/26/15 1533  TROPONINI <  0.03   BNP (last 3 results)  Recent Labs  10/26/15 1533  BNP 19.0     CBG:  Recent Labs Lab 10/30/15 2051 10/31/15 0744 10/31/15 1127 10/31/15 2051 11/01/15 0738  GLUCAP 133* 144* 244* 128* 125*    Recent Results (from the past 240 hour(s))  MRSA PCR Screening     Status: None   Collection Time: 10/26/15 10:32 PM  Result Value Ref Range Status   MRSA by PCR NEGATIVE NEGATIVE Final    Comment:        The GeneXpert MRSA Assay (FDA approved for NASAL specimens only), is one component of a comprehensive MRSA colonization surveillance program. It is not intended to diagnose MRSA infection nor to guide or monitor treatment for MRSA infections.       Scheduled Meds: . aspirin EC  81 mg Oral Daily  . buPROPion  300 mg Oral Daily  .  busPIRone  10 mg Oral QHS  . DULoxetine  30 mg Oral QHS  . DULoxetine  60 mg Oral Daily  . enoxaparin (LOVENOX) injection  40 mg Subcutaneous Q12H  . fenofibrate  160 mg Oral QPM  . fluticasone  2 spray Each Nare Daily  . gentamicin irrigation  80 mg Irrigation On Call  . insulin aspart  0-9 Units Subcutaneous TID WC & HS  . insulin aspart  5 Units Subcutaneous TID WC  . insulin glargine  60 Units Subcutaneous QHS  . loratadine  10 mg Oral Daily  . omega-3 acid ethyl esters  1 g Oral BID  . Oxcarbazepine  300 mg Oral Daily  . OXcarbazepine  600 mg Oral QHS  . oxyCODONE  20 mg Oral Q12H  . pantoprazole  40 mg Oral Daily  . pregabalin  200 mg Oral QHS  . pregabalin  400 mg Oral Daily  . senna  2 tablet Oral Daily  . sodium chloride flush  3 mL Intravenous Q12H  . tamsulosin  0.4 mg Oral QPC supper  . vancomycin  1,500 mg Intravenous On Call    Assessment/Plan:  1. Sick sinus syndrome with Syncope and bradycardia. Cardiology recommends a pacemaker.scheduled   For tomorrow.  2. Weakness. Physical therapy recommended rehabilitation.  3. Recent stroke. Patient on aspirin. Plavix being held. Patient has side effects with statins and unable to give. 4. Hypokalemia. hypomagnesemia.Replaced 5. Type 2 diabetes uncontrolled. Hemoglobin A1c 9. Continue sliding scale insulin. The patient is nothing by mouth after midnight, given the half dose of Lantus that is 30 units tonight.  6. Morbid obesity. Weight loss needed 7. BPH on Flomax 8. Depression. Continue psychiatric medications 9. Left heel decubiti present on admission. Heel protectors while in bed.  Code Status:     Code Status Orders        Start     Ordered   10/26/15 2112  Full code   Continuous     10/26/15 2111    Code Status History    Date Active Date Inactive Code Status Order ID Comments User Context   10/19/2015 10:00 PM 10/21/2015 11:51 PM Full Code [id number]  Gladstone Lighter, MD Inpatient   12/19/2014  9:31 PM  12/21/2014  5:10 PM Full Code [id number]  Charolette Forward, MD Inpatient   12/19/2014  9:23 PM 12/19/2014  9:31 PM Full Code [id number]  Charolette Forward, MD Inpatient   12/19/2014 12:38 PM 12/19/2014  5:18 PM Full Code [id number]  Dionisio [name], MD Inpatient   12/18/2014  6:36 PM 12/19/2014 12:38 PM  Full Code [id number]  Henreitta Leber, MD Inpatient    Advance Directive Documentation        Most Recent Value   Type of Advance Directive  Living will   Pre-existing out of facility DNR order (yellow form or pink MOST form)     "MOST" Form in Place?       Disposition Plan: Will need rehabilitation on Monday or Tuesday  Consultants:  Cardiology   Time spent: 20 minutes  [facility]

## 2015-11-02 ENCOUNTER — Inpatient Hospital Stay: Payer: Commercial Managed Care - HMO | Admitting: Certified Registered"

## 2015-11-02 ENCOUNTER — Encounter
Admission: EM | Disposition: A | Discharge status: Skilled Nursing Facility | Payer: Self-pay | Source: Home / Self Care | Attending: Internal Medicine

## 2015-11-02 ENCOUNTER — Inpatient Hospital Stay: Payer: Commercial Managed Care - HMO

## 2015-11-02 ENCOUNTER — Encounter: Payer: Self-pay | Admitting: Cardiology

## 2015-11-02 HISTORY — PX: PACEMAKER INSERTION: SHX728

## 2015-11-02 LAB — GLUCOSE, CAPILLARY
GLUCOSE-CAPILLARY: 124 mg/dL — AB (ref 65–99)
GLUCOSE-CAPILLARY: 156 mg/dL — AB (ref 65–99)
Glucose-Capillary: 182 mg/dL — ABNORMAL HIGH (ref 65–99)
Glucose-Capillary: 125 mg/dL — ABNORMAL HIGH (ref 65–99)
Glucose-Capillary: 179 mg/dL — ABNORMAL HIGH (ref 65–99)

## 2015-11-02 SURGERY — INSERTION, CARDIAC PACEMAKER
Anesthesia: Monitor Anesthesia Care | Site: Shoulder | Laterality: Left | Wound class: Clean

## 2015-11-02 MED ORDER — SODIUM CHLORIDE 0.9 % IJ SOLN
INTRAMUSCULAR | Status: AC
  Filled 2015-11-02: qty 50

## 2015-11-02 MED ORDER — SODIUM CHLORIDE FLUSH 0.9 % IV SOLN
INTRAVENOUS | Status: AC
  Filled 2015-11-02: qty 10

## 2015-11-02 MED ORDER — PROPOFOL 500 MG/50ML IV EMUL
INTRAVENOUS | Status: DC | PRN
  Administered 2015-11-02: 75 ug/kg/min via INTRAVENOUS

## 2015-11-02 MED ORDER — ONDANSETRON HCL 4 MG/2ML IJ SOLN: 4.0000 mg | Freq: Four times a day (QID) | INTRAMUSCULAR | Status: DC | PRN

## 2015-11-02 MED ORDER — FENTANYL CITRATE (PF) 100 MCG/2ML IJ SOLN
25.0000 ug | INTRAMUSCULAR | Status: DC | PRN
Start: 2015-11-02 — End: 2015-11-03

## 2015-11-02 MED ORDER — LIDOCAINE 1 % OPTIME INJ - NO CHARGE
INTRAMUSCULAR | Status: DC | PRN
  Administered 2015-11-02: 30 mL

## 2015-11-02 MED ORDER — LIDOCAINE HCL (PF) 1 % IJ SOLN
INTRAMUSCULAR | Status: AC
  Filled 2015-11-02: qty 30

## 2015-11-02 MED ORDER — SODIUM CHLORIDE 0.9 % IR SOLN
Status: DC | PRN
  Administered 2015-11-02: 200 mL

## 2015-11-02 MED ORDER — SODIUM CHLORIDE 0.9 % IR SOLN
80.0000 mg | Status: DC
  Filled 2015-11-02 (×2): qty 2

## 2015-11-02 MED ORDER — MIDAZOLAM HCL 5 MG/5ML IJ SOLN
INTRAMUSCULAR | Status: DC | PRN
  Administered 2015-11-02: 2 mg via INTRAVENOUS

## 2015-11-02 MED ORDER — LIDOCAINE HCL (CARDIAC) 20 MG/ML IV SOLN
INTRAVENOUS | Status: DC | PRN
  Administered 2015-11-02: 80 mg via INTRAVENOUS

## 2015-11-02 MED ORDER — VANCOMYCIN HCL IN DEXTROSE 1-5 GM/200ML-% IV SOLN
1000.0000 mg | Freq: Once | INTRAVENOUS | Status: AC
  Administered 2015-11-02: 1000 mg via INTRAVENOUS
  Filled 2015-11-02: qty 200

## 2015-11-02 MED ORDER — GENTAMICIN SULFATE 40 MG/ML IJ SOLN
INTRAMUSCULAR | Status: AC
  Filled 2015-11-02: qty 2

## 2015-11-02 MED ORDER — SODIUM CHLORIDE 0.9 % IJ SOLN
INTRAMUSCULAR | Status: DC | PRN
  Administered 2015-11-02: 50 mL via INTRAVENOUS

## 2015-11-02 MED ORDER — FENTANYL CITRATE (PF) 100 MCG/2ML IJ SOLN
INTRAMUSCULAR | Status: DC | PRN
  Administered 2015-11-02 (×4): 25 ug via INTRAVENOUS

## 2015-11-02 MED ORDER — ACETAMINOPHEN 325 MG PO TABS: 325.0000 mg | ORAL_TABLET | ORAL | Status: DC | PRN

## 2015-11-02 MED ORDER — VANCOMYCIN HCL 1000 MG IV SOLR
1000.0000 mg | INTRAVENOUS | Status: DC | PRN
  Administered 2015-11-02 (×2): 1500 mg via INTRAVENOUS

## 2015-11-02 MED ORDER — ONDANSETRON HCL 4 MG/2ML IJ SOLN: 4.0000 mg | Freq: Once | INTRAMUSCULAR | Status: DC | PRN

## 2015-11-02 MED ORDER — VANCOMYCIN HCL 10 G IV SOLR
1500.0000 mg | INTRAVENOUS | Status: AC
  Administered 2015-11-02: 1500 mg via INTRAVENOUS
  Filled 2015-11-02: qty 1500

## 2015-11-02 MED ORDER — ONDANSETRON HCL 4 MG/2ML IJ SOLN
INTRAMUSCULAR | Status: DC | PRN
  Administered 2015-11-02: 4 mg via INTRAVENOUS

## 2015-11-02 SURGICAL SUPPLY — 44 items
BAG DECANTER FOR FLEXI CONT (MISCELLANEOUS) ×3 IMPLANT
BRUSH SCRUB 4% CHG (MISCELLANEOUS) ×3 IMPLANT
CABLE SURG 12 DISP A/V CHANNEL (MISCELLANEOUS) ×9 IMPLANT
CANISTER SUCT 1200ML W/VALVE (MISCELLANEOUS) ×3 IMPLANT
CHLORAPREP W/TINT 26ML (MISCELLANEOUS) ×6 IMPLANT
COVER LIGHT HANDLE STERIS (MISCELLANEOUS) ×6 IMPLANT
COVER MAYO STAND STRL (DRAPES) ×3 IMPLANT
DEVICE DISSECT PLASMABLAD 3.0S (MISCELLANEOUS) IMPLANT
DRAPE C-ARM XRAY 36X54 (DRAPES) ×3 IMPLANT
DRESSING TELFA 4X3 1S ST N-ADH (GAUZE/BANDAGES/DRESSINGS) ×3 IMPLANT
DRSG TEGADERM 4X4.75 (GAUZE/BANDAGES/DRESSINGS) ×3 IMPLANT
ELECT REM PT RETURN 9FT ADLT (ELECTROSURGICAL) ×3
ELECTRODE REM PT RTRN 9FT ADLT (ELECTROSURGICAL) ×1 IMPLANT
GLOVE BIO SURGEON STRL SZ7.5 (GLOVE) ×9 IMPLANT
GLOVE BIO SURGEON STRL SZ8 (GLOVE) ×3 IMPLANT
GOWN STRL REUS W/ TWL LRG LVL3 (GOWN DISPOSABLE) ×1 IMPLANT
GOWN STRL REUS W/ TWL XL LVL3 (GOWN DISPOSABLE) ×1 IMPLANT
GOWN STRL REUS W/TWL LRG LVL3 (GOWN DISPOSABLE) ×2
GOWN STRL REUS W/TWL XL LVL3 (GOWN DISPOSABLE) ×2
IMMOBILIZER SHDR MD LX WHT (SOFTGOODS) IMPLANT
IMMOBILIZER SHDR XL LX WHT (SOFTGOODS) ×3 IMPLANT
INTRO PACEMAKR LEAD 9FR 13CM (INTRODUCER) ×3
INTRO PACEMKR SHEATH II 7FR (MISCELLANEOUS) ×6
INTRODUCER PACEMKR LD 9FR 13CM (INTRODUCER) ×1 IMPLANT
INTRODUCER PACEMKR SHTH II 7FR (MISCELLANEOUS) ×2 IMPLANT
IV NS 500ML (IV SOLUTION) ×3
IV NS 500ML BAXH (IV SOLUTION) ×1 IMPLANT
KIT RM TURNOVER STRD PROC AR (KITS) ×3 IMPLANT
LABEL OR SOLS (LABEL) ×3 IMPLANT
LEAD CAPSURE NOVUS 5076-52CM (Lead) ×3 IMPLANT
LEAD CAPSURE NOVUS 5076-58CM (Lead) ×3 IMPLANT
LEAD INTRODUCER 7FR 23CM (INTRODUCER) ×3 IMPLANT
MARKER SKIN DUAL TIP RULER LAB (MISCELLANEOUS) ×3 IMPLANT
PACEMAKER STYLET J BLUE (MISCELLANEOUS) ×4
PACEMAKER STYLET J BLUE 52 (MISCELLANEOUS) ×2 IMPLANT
PACEMAKER STYLET J GRAY (MISCELLANEOUS) ×2
PACEMAKER STYLET J GRAY 53 (MISCELLANEOUS) ×1 IMPLANT
PACK PACE INSERTION (MISCELLANEOUS) ×3 IMPLANT
PAD STATPAD (MISCELLANEOUS) ×3 IMPLANT
PLASMABLADE 3.0S (MISCELLANEOUS)
PPM ADVISA MRI DR A2DR01 (Pacemaker) ×3 IMPLANT
SUT SILK 0 SH 30 (SUTURE) ×9 IMPLANT
SUT VIC AB 2-0 SH 27 (SUTURE) ×2
SUT VIC AB 2-0 SH 27XBRD (SUTURE) ×1 IMPLANT

## 2015-11-02 NOTE — Transfer of Care (Signed)
Immediate Anesthesia Transfer of Care Note  Patient: Victor Wise  Procedure(s) Performed: Procedure(s): INSERTION PACEMAKER (Left)  Patient Location: PACU  Anesthesia Type:MAC  Level of Consciousness: awake  Airway & Oxygen Therapy: Patient Spontanous Breathing  Post-op Assessment: Report given to RN  Post vital signs: Reviewed and stable  Last Vitals:  Filed Vitals:   11/02/15 1136 11/02/15 1440  BP: 157/57 126/82  Pulse: 54 59  Temp: 36.7 C 35.9 C  Resp: 18 16    Last Pain:  Filed Vitals:   11/02/15 1443  PainSc: 0-No pain         Complications: No apparent anesthesia complications

## 2015-11-02 NOTE — Progress Notes (Signed)
Patient refuses the bed alarm, educated patient about the importance to having bed alarm active due to recent surgery.

## 2015-11-02 NOTE — Progress Notes (Signed)
Patient ID: Victor Wise, male   DOB: May 06, 1951, 65 y.o.   MRN: [id number] [facility] PROGRESS NOTE  Victor Wise [id number] DOB: 28-Mar-1951 DOA: 10/26/2015 PCP: Lavera Guise, MD  HPI/Subjective:  Objective; no further chest pain awaiting pacemaker later today  Filed Vitals:   11/02/15 1113 11/02/15 1136  BP: 141/57 157/57  Pulse: 51 54  Temp: 97.4 F (36.3 C) 98 F (36.7 C)  Resp: 19 18    Filed Weights   10/26/15 1541 10/26/15 2112  Weight: 133.811 kg (295 lb) 136.85 kg (301 lb 11.2 oz)    ROS: Review of Systems  Constitutional: Negative for fever and chills.  Eyes: Negative for blurred vision.  Respiratory: Negative for cough and shortness of breath.   Cardiovascular: Negative for chest pain.  Gastrointestinal: Negative for nausea, vomiting, abdominal pain, diarrhea and constipation.  Genitourinary: Negative for dysuria.  Musculoskeletal: Negative for joint pain.  Neurological: Positive for weakness. Negative for dizziness and headaches.   Exam: Physical Exam  Constitutional: He is oriented to person, place, and time.  HENT:  Nose: No mucosal edema.  Mouth/Throat: No oropharyngeal exudate or posterior oropharyngeal edema.  Eyes: Conjunctivae, EOM and lids are normal. Pupils are equal, round, and reactive to light.  Neck: No JVD present. Carotid bruit is not present. No edema present. No thyroid mass and no thyromegaly present.  Cardiovascular: S1 normal and S2 normal.  Exam reveals no gallop.   No murmur heard. Pulses:      Dorsalis pedis pulses are 2+ on the right side, and 2+ on the left side.  Respiratory: No respiratory distress. He has no wheezes. He has no rhonchi. He has no rales.  GI: Soft. Bowel sounds are normal. There is no tenderness.  Musculoskeletal:       Right ankle: He exhibits swelling.       Left ankle: He exhibits swelling.  Lymphadenopathy:    He has no cervical adenopathy.  Neurological: He is alert and oriented to person, place,  and time. No cranial nerve deficit.  Skin: Skin is warm. No rash noted. Nails show no clubbing.  Left heel slight skin breakdown but no signs of infection. Left foot small area of darkened skin on the bottom of the foot. Right second toe hammertoe with a scab and slight erythema seen around it.  Psychiatric: He has a normal mood and affect.      Data Reviewed: Basic Metabolic Panel:  Recent Labs Lab 10/26/15 1533 10/27/15 0531 10/28/15 0327 10/31/15 0533  NA 137 139 140  --   K 3.7 3.2* 3.7  --   CL 102 104 105  --   CO2 25 27 29   --   GLUCOSE 267* 233* 174*  --   BUN 25* 21* 21*  --   CREATININE 1.33* 1.13 1.08 1.04  CALCIUM 9.2 8.7* 8.9  --   MG  --   --  1.7  --    Liver Function Tests:  Recent Labs Lab 10/26/15 1533  AST 21  ALT 20  ALKPHOS 54  BILITOT 0.6  PROT 6.9  ALBUMIN 4.2   CBC:  Recent Labs Lab 10/26/15 1533 10/27/15 0531  WBC 6.0 4.2  HGB 12.2* 12.2*  HCT 37.4* 36.8*  MCV 78.2* 79.6*  PLT 186 148*   Cardiac Enzymes:  Recent Labs Lab 10/26/15 1533  TROPONINI <0.03   BNP (last 3 results)  Recent Labs  10/26/15 1533  BNP 19.0     CBG:  Recent Labs Lab 11/01/15 1221 11/01/15 1619 11/01/15 2034 11/02/15 0717 11/02/15 1109  GLUCAP 122* 153* 186* 182* 156*    Recent Results (from the past 240 hour(s))  MRSA PCR Screening     Status: None   Collection Time: 10/26/15 10:32 PM  Result Value Ref Range Status   MRSA by PCR NEGATIVE NEGATIVE Final    Comment:        The GeneXpert MRSA Assay (FDA approved for NASAL specimens only), is one component of a comprehensive MRSA colonization surveillance program. It is not intended to diagnose MRSA infection nor to guide or monitor treatment for MRSA infections.       Scheduled Meds: . [MAR Hold] aspirin EC  81 mg Oral Daily  . [MAR Hold] buPROPion  300 mg Oral Daily  . [MAR Hold] busPIRone  10 mg Oral QHS  . [MAR Hold] DULoxetine  30 mg Oral QHS  . [MAR Hold] DULoxetine   60 mg Oral Daily  . [MAR Hold] fenofibrate  160 mg Oral QPM  . [MAR Hold] fluticasone  2 spray Each Nare Daily  . gentamicin irrigation  80 mg Irrigation On Call  . [MAR Hold] insulin aspart  0-9 Units Subcutaneous TID WC & HS  . [MAR Hold] insulin aspart  5 Units Subcutaneous TID WC  . [MAR Hold] insulin glargine  30 Units Subcutaneous QHS  . [MAR Hold] loratadine  10 mg Oral Daily  . [MAR Hold] omega-3 acid ethyl esters  1 g Oral BID  . [MAR Hold] Oxcarbazepine  300 mg Oral Daily  . [MAR Hold] OXcarbazepine  600 mg Oral QHS  . [MAR Hold] oxyCODONE  20 mg Oral Q12H  . [MAR Hold] pantoprazole  40 mg Oral Daily  . [MAR Hold] pregabalin  200 mg Oral QHS  . [MAR Hold] pregabalin  400 mg Oral Daily  . [MAR Hold] senna  2 tablet Oral Daily  . [MAR Hold] sodium chloride flush  3 mL Intravenous Q12H  . sodium chloride flush      . [MAR Hold] tamsulosin  0.4 mg Oral QPC supper  . vancomycin  1,500 mg Intravenous On Call    Assessment/Plan:  1. Sick sinus syndrome with Syncope and bradycardiaMake her today .  2. Weakness. Physical therapy recommended rehabilitation. Tomorrow to skilled nursing facility  3. Recent stroke. Resume aspirin and Plavix  4. Hypokalemia. hypomagnesemia.Replaced 5. Type 2 diabetes uncontrolled. Hemoglobin A1c 9. Continue sliding scale insulin. We'll adjust his insulin on discharge 6. Morbid obesity. Weight loss needed 7. BPH on Flomax 8. Depression. Continue psychiatric medications 9. Left heel decubiti present on admission. Heel protectors while in bed.  Code Status:     Code Status Orders        Start     Ordered   10/26/15 2112  Full code   Continuous     10/26/15 2111    Code Status History    Date Active Date Inactive Code Status Order ID Comments User Context   10/19/2015 10:00 PM 10/21/2015 11:51 PM Full Code [id number]  Gladstone Lighter, MD Inpatient   12/19/2014  9:31 PM 12/21/2014  5:10 PM Full Code [id number]  Charolette Forward, MD Inpatient   12/19/2014   9:23 PM 12/19/2014  9:31 PM Full Code [id number]  Charolette Forward, MD Inpatient   12/19/2014 12:38 PM 12/19/2014  5:18 PM Full Code [id number]  Dionisio Kirklin, MD Inpatient   12/18/2014  6:36 PM 12/19/2014 12:38 PM Full Code [id number]  Belia Heman  Verdell Carmine, MD Inpatient    Advance Directive Documentation        Most Recent Value   Type of Advance Directive  Living will   Pre-existing out of facility DNR order (yellow form or pink MOST form)     "MOST" Form in Place?       Disposition Plan: Discharge to rehabilitation tomorrow  Consultants:  Cardiology   Time spent: 20 minutes  [place], [facility]

## 2015-11-02 NOTE — Care Management Important Message (Signed)
Important Message  Patient Details  Name: LOFTON DIGIUSEPPE MRN: [id number] Date of Birth: 1951-03-05   Medicare Important Message Given:  Yes    Jolly Mango, RN 11/02/2015, 8:42 AM

## 2015-11-02 NOTE — Op Note (Signed)
[facility] Cardiology   10/26/2015 - 11/02/2015                     2:32 PM  PATIENT:  Victor Wise    PRE-OPERATIVE DIAGNOSIS:  Symptomatic bradycardia  POST-OPERATIVE DIAGNOSIS:  Same  PROCEDURE:  INSERTION PACEMAKER  SURGEON:  Mickael Mcnutt, MD    ANESTHESIA:     PREOPERATIVE INDICATIONS:  Victor Wise is a  65 y.o. male with a diagnosis of Symptomatic bradycardia who failed conservative measures and elected for surgical management.    The risks benefits and alternatives were discussed with the patient preoperatively including but not limited to the risks of infection, bleeding, cardiopulmonary complications, the need for revision surgery, among others, and the patient was willing to proceed.   OPERATIVE PROCEDURE: The patient was brought to the operating room the fasting state. The left pectoral region was prepped and draped in the usual sterile manner. Anesthesia was obtained 1% lidocaine locally. A 6 cm incision was performed a left pectoral region. The pacemaker pocket was generated by electrocautery and blunt dissection. Access was obtained to left subclavian vein by fine needle aspiration. MRI compatible ventricular and atrial leads were positioned to right ventricular apical septum and right atrial appendage and fluoroscopic guidance. After proper thresholds were obtained the leads were sutured in place. The pacemaker pocket was irrigated with gentamicin solution. The leads were connected to a dual-chamber rate responsive MRI compatible pacemaker generator (Medtronic A2 DRO 1) and positioned into the pocket. The pacemaker pocket was closed with 2-0 and 4-0 Vicryl, respectively. Steri-Strips and a pressure dressing were applied.

## 2015-11-02 NOTE — Anesthesia Preprocedure Evaluation (Signed)
Anesthesia Evaluation  Patient identified by MRN, date of birth, ID band Patient awake    Reviewed: Allergy & Precautions, NPO status , Patient's Chart, lab work & pertinent test results  Airway Mallampati: II       Dental  (+) Teeth Intact   Pulmonary sleep apnea , former smoker,     + decreased breath sounds      Cardiovascular Exercise Tolerance: Poor hypertension, Pt. on medications + Past MI, + Cardiac Stents and + DOE   Rate:Bradycardia     Neuro/Psych Depression CVA    GI/Hepatic Neg liver ROS, GERD  ,  Endo/Other  diabetes, Type 1, Insulin DependentMorbid obesity  Renal/GU      Musculoskeletal   Abdominal (+) + obese,   Peds  Hematology negative hematology ROS (+)   Anesthesia Other Findings   Reproductive/Obstetrics                             Anesthesia Physical Anesthesia Plan  ASA: IV  Anesthesia Plan: MAC   Post-op Pain Management:    Induction: Intravenous  Airway Management Planned: Natural Airway and Nasal Cannula  Additional Equipment:   Intra-op Plan:   Post-operative Plan:   Informed Consent: I have reviewed the patients History and Physical, chart, labs and discussed the procedure including the risks, benefits and alternatives for the proposed anesthesia with the patient or authorized representative who has indicated his/her understanding and acceptance.     Plan Discussed with: CRNA  Anesthesia Plan Comments:         Anesthesia Quick Evaluation

## 2015-11-02 NOTE — Progress Notes (Signed)
CSW updated Broadus John- Admissions Coordinator at [facility] and informed him that patient received a pacemaker today. CSW will continue to follow and assist.  Ernest Pine, MSW, [place] Work Department [phone]

## 2015-11-02 NOTE — Care Management (Signed)
Barrier to discharge: Pacemaker placement today

## 2015-11-03 ENCOUNTER — Other Ambulatory Visit: Payer: Self-pay | Admitting: *Deleted

## 2015-11-03 DIAGNOSIS — I5032 Chronic diastolic (congestive) heart failure: Secondary | ICD-10-CM | POA: Diagnosis not present

## 2015-11-03 DIAGNOSIS — G4733 Obstructive sleep apnea (adult) (pediatric): Secondary | ICD-10-CM | POA: Diagnosis not present

## 2015-11-03 DIAGNOSIS — Z794 Long term (current) use of insulin: Principal | ICD-10-CM

## 2015-11-03 DIAGNOSIS — N390 Urinary tract infection, site not specified: Secondary | ICD-10-CM | POA: Diagnosis not present

## 2015-11-03 DIAGNOSIS — S42402D Unspecified fracture of lower end of left humerus, subsequent encounter for fracture with routine healing: Secondary | ICD-10-CM | POA: Diagnosis not present

## 2015-11-03 DIAGNOSIS — G933 Postviral fatigue syndrome: Secondary | ICD-10-CM | POA: Diagnosis not present

## 2015-11-03 DIAGNOSIS — G894 Chronic pain syndrome: Secondary | ICD-10-CM | POA: Diagnosis not present

## 2015-11-03 DIAGNOSIS — I495 Sick sinus syndrome: Secondary | ICD-10-CM | POA: Diagnosis not present

## 2015-11-03 DIAGNOSIS — K219 Gastro-esophageal reflux disease without esophagitis: Secondary | ICD-10-CM | POA: Diagnosis not present

## 2015-11-03 DIAGNOSIS — Z7401 Bed confinement status: Secondary | ICD-10-CM | POA: Diagnosis not present

## 2015-11-03 DIAGNOSIS — F32A Depression, unspecified: Secondary | ICD-10-CM

## 2015-11-03 DIAGNOSIS — S42402A Unspecified fracture of lower end of left humerus, initial encounter for closed fracture: Secondary | ICD-10-CM | POA: Diagnosis not present

## 2015-11-03 DIAGNOSIS — I10 Essential (primary) hypertension: Secondary | ICD-10-CM | POA: Diagnosis not present

## 2015-11-03 DIAGNOSIS — F329 Major depressive disorder, single episode, unspecified: Secondary | ICD-10-CM

## 2015-11-03 DIAGNOSIS — R569 Unspecified convulsions: Secondary | ICD-10-CM | POA: Diagnosis not present

## 2015-11-03 DIAGNOSIS — G464 Cerebellar stroke syndrome: Secondary | ICD-10-CM | POA: Diagnosis not present

## 2015-11-03 DIAGNOSIS — Z5189 Encounter for other specified aftercare: Secondary | ICD-10-CM | POA: Diagnosis not present

## 2015-11-03 DIAGNOSIS — E119 Type 2 diabetes mellitus without complications: Secondary | ICD-10-CM | POA: Diagnosis not present

## 2015-11-03 DIAGNOSIS — R262 Difficulty in walking, not elsewhere classified: Secondary | ICD-10-CM | POA: Diagnosis not present

## 2015-11-03 DIAGNOSIS — R55 Syncope and collapse: Secondary | ICD-10-CM | POA: Diagnosis not present

## 2015-11-03 DIAGNOSIS — T82599A Other mechanical complication of unspecified cardiac and vascular devices and implants, initial encounter: Secondary | ICD-10-CM | POA: Diagnosis not present

## 2015-11-03 DIAGNOSIS — I639 Cerebral infarction, unspecified: Secondary | ICD-10-CM | POA: Diagnosis not present

## 2015-11-03 DIAGNOSIS — M6281 Muscle weakness (generalized): Secondary | ICD-10-CM | POA: Diagnosis not present

## 2015-11-03 DIAGNOSIS — E118 Type 2 diabetes mellitus with unspecified complications: Secondary | ICD-10-CM

## 2015-11-03 DIAGNOSIS — I2109 ST elevation (STEMI) myocardial infarction involving other coronary artery of anterior wall: Secondary | ICD-10-CM | POA: Diagnosis not present

## 2015-11-03 DIAGNOSIS — I6350 Cerebral infarction due to unspecified occlusion or stenosis of unspecified cerebral artery: Secondary | ICD-10-CM | POA: Diagnosis not present

## 2015-11-03 DIAGNOSIS — E782 Mixed hyperlipidemia: Secondary | ICD-10-CM | POA: Diagnosis not present

## 2015-11-03 DIAGNOSIS — E785 Hyperlipidemia, unspecified: Secondary | ICD-10-CM | POA: Diagnosis not present

## 2015-11-03 DIAGNOSIS — E876 Hypokalemia: Secondary | ICD-10-CM | POA: Diagnosis not present

## 2015-11-03 DIAGNOSIS — Z950 Presence of cardiac pacemaker: Secondary | ICD-10-CM | POA: Diagnosis not present

## 2015-11-03 DIAGNOSIS — I63511 Cerebral infarction due to unspecified occlusion or stenosis of right middle cerebral artery: Secondary | ICD-10-CM | POA: Diagnosis not present

## 2015-11-03 DIAGNOSIS — I2510 Atherosclerotic heart disease of native coronary artery without angina pectoris: Secondary | ICD-10-CM | POA: Diagnosis not present

## 2015-11-03 DIAGNOSIS — E1149 Type 2 diabetes mellitus with other diabetic neurological complication: Secondary | ICD-10-CM | POA: Diagnosis not present

## 2015-11-03 DIAGNOSIS — I110 Hypertensive heart disease with heart failure: Secondary | ICD-10-CM | POA: Diagnosis not present

## 2015-11-03 LAB — GLUCOSE, CAPILLARY
GLUCOSE-CAPILLARY: 213 mg/dL — AB (ref 65–99)
Glucose-Capillary: 125 mg/dL — ABNORMAL HIGH (ref 65–99)

## 2015-11-03 MED ORDER — OXYCODONE HCL ER 20 MG PO T12A: 20.0000 mg | EXTENDED_RELEASE_TABLET | Freq: Two times a day (BID) | ORAL | Status: DC

## 2015-11-03 MED ORDER — INSULIN ASPART 100 UNIT/ML ~~LOC~~ SOLN: 5.0000 [IU] | Freq: Three times a day (TID) | SUBCUTANEOUS | Status: DC

## 2015-11-03 MED ORDER — CLARITHROMYCIN 500 MG PO TABS: 500.0000 mg | ORAL_TABLET | Freq: Two times a day (BID) | ORAL | Status: DC

## 2015-11-03 MED ORDER — ACETAMINOPHEN 325 MG PO TABS: 650.0000 mg | ORAL_TABLET | Freq: Four times a day (QID) | ORAL | Status: DC | PRN

## 2015-11-03 MED ORDER — OXYCODONE HCL 5 MG PO TABS: 5.0000 mg | ORAL_TABLET | Freq: Three times a day (TID) | ORAL | Status: DC | PRN

## 2015-11-03 MED ORDER — INSULIN GLARGINE 100 UNIT/ML ~~LOC~~ SOLN: 40.0000 [IU] | Freq: Every day | SUBCUTANEOUS | Status: DC

## 2015-11-03 NOTE — Discharge Summary (Signed)
Victor Wise, 65 y.o., DOB [birth year], MRN [id number]. Admission date: 10/26/2015 Discharge Date 11/03/2015 Primary MD Lavera Guise, MD Admitting Physician Vaughan Basta, MD  Admission Diagnosis  Syncope and collapse [R55] Pain [R52] Fall [W19.XXXA]  Discharge Diagnosis   Principal Problem:   Syncopal episodes   Syncope   Sick sinus syndrome (HCC)   DM2   Chronic pain syndrome   Gerd   BPH   OM of foot   Morbid Obesity   Depression          Hospital CourseDavid Holms is a 65 y.o. male with a known history DM2, hypertension, benign prostatic hypertrophy, morbid obesity, chronic back pain, depression, CAD, stroke, basal cell carcinoma- was admitted to hospital recently for  stroke and after workup discharged to home. He was feeling fine and at his baseline up until yesterday.On day of admission he was trying to get to his car from his home and he had a syncopal episode he just passed out. Patient was seen in ED admitted for further w/u.  He was noted to have bradycardia which persisted was seen by cardiology and felt to have SSS. He underwent pacemaker placment yesterday. He is very weak needs rehab.            Consults  cardiology  Significant Tests:  See full reports for all details      Dg Chest 1 View  11/02/2015  CLINICAL DATA:  Pacemaker insertion. EXAM: CHEST 1 VIEW COMPARISON:  10/26/2015 FINDINGS: Dual lead pacemaker is been placed from a left subclavian approach. Leads are in the region of the right atrium and right ventricle. Chronic left ventricular prominence. No pneumothorax. Lungs are clear. IMPRESSION: Good appearance following dual lead pacemaker placement. Electronically Signed   By: Nelson Chimes M.D.   On: 11/02/2015 15:10   Dg Knee 1-2 Views Right  10/26/2015  CLINICAL DATA:  65 year old male with syncope and fall. Posterior right knee pain. Initial encounter. EXAM: RIGHT KNEE - 1-2 VIEW COMPARISON:  Right femur series  10/21/2015 FINDINGS: Long  segment vascular stent extending from the right mid thigh to the popliteal fossa re- demonstrated and appears stable in configuration. Sip there tricompartmental degenerative changes at the right knee with a space loss and bulky osteophytosis. No definite joint effusion. No acute fracture or dislocation identified about the right knee. IMPRESSION: 1. No acute fracture or dislocation identified about the right knee. 2. Severe tricompartmental degenerative changes. 3. Long segment right lower extremity vascular stent appears stable. Electronically Signed   By: Genevie Ann M.D.   On: 10/26/2015 16:56   Ct Head Wo Contrast  10/26/2015  CLINICAL DATA:  Recent syncopal episode with posterior head injury, initial encounter EXAM: CT HEAD WITHOUT CONTRAST TECHNIQUE: Contiguous axial images were obtained from the base of the skull through the vertex without intravenous contrast. COMPARISON:  10/19/2015 FINDINGS: Bony calvarium is intact. Mild atrophic changes are noted. There is again seen an area of decreased attenuation within the deep white matter or/basal ganglia on the left somewhat smaller than that seen on the prior exam consistent with prior infarct. No findings to suggest acute hemorrhage, acute infarction or space-occupying mass lesion are noted. IMPRESSION: Changes consistent with prior lacunar infarct on the left as described. No acute abnormality is noted. Electronically Signed   By: Inez Catalina M.D.   On: 10/26/2015 16:53   Ct Head Wo Contrast  10/19/2015  CLINICAL DATA:  Weakness.  Fall. EXAM: CT HEAD WITHOUT CONTRAST TECHNIQUE: Contiguous axial  images were obtained from the base of the skull through the vertex without intravenous contrast. COMPARISON:  12/18/2014 head CT. FINDINGS: There is a relatively well marginated low-attenuation focus in the left frontal corona radiata extending into the left putamen, new since 12/18/2014. No significant mass-effect. No evidence of parenchymal hemorrhage or extra-axial  fluid collection. No midline shift. Intracranial atherosclerosis. Cerebral volume is age appropriate. No ventriculomegaly. The visualized paranasal sinuses are essentially clear. The mastoid air cells are unopacified. No evidence of calvarial fracture. IMPRESSION: Relatively well marginated low-attenuation focus in the left frontal corona radiata/left basal ganglia, new since 12/18/2014, most suggestive of a subacute infarct. No significant mass effect. No acute intracranial hemorrhage. These results were called by telephone at the time of interpretation on 10/19/2015 at 5:27 pm to [place], who verbally acknowledged these results. Electronically Signed   By: Ilona Sorrel M.D.   On: 10/19/2015 17:33   US Carotid Bilateral  10/20/2015  CLINICAL DATA:  CVA, slurred speech, weakness, hyperlipidemia. EXAM: BILATERAL CAROTID DUPLEX ULTRASOUND TECHNIQUE: Pearline Cables scale imaging, color Doppler and duplex ultrasound were performed of bilateral carotid and vertebral arteries in the neck. COMPARISON:  None. FINDINGS: Criteria: Quantification of carotid stenosis is based on velocity parameters that correlate the residual internal carotid diameter with NASCET-based stenosis levels, using the diameter of the distal internal carotid lumen as the denominator for stenosis measurement. The following velocity measurements were obtained: RIGHT ICA:  48/15 cm/sec CCA:  [id number] cm/sec SYSTOLIC ICA/CCA RATIO:  0.5 DIASTOLIC ICA/CCA RATIO:  1.2 ECA:  67 cm/sec LEFT ICA:  48/15 cm/sec CCA:  0000000 cm/sec SYSTOLIC ICA/CCA RATIO:  0.6 DIASTOLIC ICA/CCA RATIO:  1.6 ECA:  60 cm/sec RIGHT CAROTID ARTERY: Minor echogenic shadowing plaque formation. No hemodynamically significant right ICA stenosis, velocity elevation, or turbulent flow. Degree of narrowing less than 50%. RIGHT VERTEBRAL ARTERY:  Antegrade LEFT CAROTID ARTERY: Similar scattered minor echogenic plaque formation. No hemodynamically significant left ICA stenosis, velocity elevation,  or turbulent flow. LEFT VERTEBRAL ARTERY:  Antegrade IMPRESSION: Minor carotid atherosclerosis. No hemodynamically significant ICA stenosis. Degree of narrowing less than 50% bilaterally. Patent antegrade vertebral flow bilaterally. Electronically Signed   By: Jerilynn Mages.  Shick M.D.   On: 10/20/2015 10:25   Dg Chest Portable 1 View  10/26/2015  CLINICAL DATA:  65 year old male with syncope. Initial encounter. EXAM: PORTABLE CHEST 1 VIEW COMPARISON:  12/18/2014 and earlier. FINDINGS: Seated AP view of the chest. Large body habitus. Allowing for AP technique cardiac and mediastinal contours are within normal limits. Visualized tracheal air column is within normal limits. No pneumothorax, pulmonary edema, pleural effusion or confluent pulmonary opacity. IMPRESSION: No acute cardiopulmonary abnormality. Electronically Signed   By: Genevie Ann M.D.   On: 10/26/2015 16:54   Dg C-arm 1-60 Min-no Report  11/02/2015  CLINICAL DATA: surgery elective C-ARM 1-60 MINUTES Fluoroscopy was utilized by the requesting physician.  No radiographic interpretation.   Dg Hip Unilat  With Pelvis 2-3 Views Right  10/19/2015  CLINICAL DATA:  Fall 2 days ago from standing position with right hip pain, initial encounter EXAM: DG HIP (WITH OR WITHOUT PELVIS) 2-3V RIGHT COMPARISON:  None. FINDINGS: Mild degenerative changes in the lumbar spine are seen. The pelvic ring is intact. No acute fracture or dislocation is noted. A stimulating device is noted over the left iliac wing. IMPRESSION: No acute abnormality noted. Electronically Signed   By: Inez Catalina M.D.   On: 10/19/2015 17:38   Dg Femur, Min 2 Views Right  10/21/2015  CLINICAL DATA:  Pain EXAM: RIGHT FEMUR 2 VIEWS COMPARISON:  None. FINDINGS: Frontal and lateral views were obtained. There is no demonstrable fracture or dislocation. No abnormal periosteal reaction. There is moderate osteoarthritic change the right hip and knee joint regions. No knee joint effusion evident. A stent is noted  in the superficial femoral artery and portion of popliteal artery on the right. IMPRESSION: Osteoarthritic change in the knee and hip joints, more severe in the knee region. Vascular stent present. No fracture or dislocation. No erosive change or bony destruction. No abnormal periosteal reaction. Electronically Signed   By: Lowella Grip III M.D.   On: 10/21/2015 12:30       Today   Subjective:   Mathews Robinsons  Feels ok no complaints.   Objective:   Blood pressure 153/72, pulse 60, temperature 97.6 F (36.4 C), temperature source Oral, resp. rate 18, height 5' 8.5" (1.74 m), weight 136.85 kg (301 lb 11.2 oz), SpO2 97 %.  .  Intake/Output Summary (Last 24 hours) at 11/03/15 0821 Last data filed at 11/03/15 0751  Gross per 24 hour  Intake   1140 ml  Output    605 ml  Net    535 ml    Exam VITAL SIGNS: Blood pressure 153/72, pulse 60, temperature 97.6 F (36.4 C), temperature source Oral, resp. rate 18, height 5' 8.5" (1.74 m), weight 136.85 kg (301 lb 11.2 oz), SpO2 97 %.  GENERAL:  65 y.o.-year-old patient lying in the bed with no acute distress.  EYES: Pupils equal, round, reactive to light and accommodation. No scleral icterus. Extraocular muscles intact.  HEENT: Head atraumatic, normocephalic. Oropharynx and nasopharynx clear.  NECK:  Supple, no jugular venous distention. No thyroid enlargement, no tenderness.  LUNGS: Normal breath sounds bilaterally, no wheezing, rales,rhonchi or crepitation. No use of accessory muscles of respiration.  CARDIOVASCULAR: S1, S2 normal. No murmurs, rubs, or gallops.  ABDOMEN: Soft, nontender, nondistended. Bowel sounds present. No organomegaly or mass.  EXTREMITIES: No pedal edema, cyanosis, or clubbing.  NEUROLOGIC: Cranial nerves II through XII are intact. Muscle strength 5/5 in all extremities. Sensation intact. Gait not checked.  PSYCHIATRIC: The patient is alert and oriented x 3.  SKIN: has pacemkaer in place.   Data Review     CBC w  Diff: Lab Results  Component Value Date   WBC 4.2 10/27/2015   WBC 6.3 02/06/2014   HGB 12.2* 10/27/2015   HGB 11.6* 02/06/2014   HCT 36.8* 10/27/2015   HCT 35.9* 02/06/2014   PLT 148* 10/27/2015   PLT 221 02/06/2014   LYMPHOPCT 5 12/18/2014   LYMPHOPCT 26.5 12/16/2013   MONOPCT 1 12/18/2014   MONOPCT 5.1 12/16/2013   EOSPCT 0 12/18/2014   EOSPCT 3.9 12/16/2013   BASOPCT 1 12/18/2014   BASOPCT 1.1 12/16/2013   CMP: Lab Results  Component Value Date   NA 140 10/28/2015   NA 138 02/06/2014   K 3.7 10/28/2015   K 4.6 02/06/2014   CL 105 10/28/2015   CL 102 02/06/2014   CO2 29 10/28/2015   CO2 30 02/06/2014   BUN 21* 10/28/2015   BUN 31* 02/06/2014   CREATININE 1.04 10/31/2015   CREATININE 1.16 02/06/2014   PROT 6.9 10/26/2015   PROT 6.8 02/06/2014   ALBUMIN 4.2 10/26/2015   ALBUMIN 3.4 02/06/2014   BILITOT 0.6 10/26/2015   BILITOT 0.3 02/06/2014   ALKPHOS 54 10/26/2015   ALKPHOS 49 02/06/2014   AST 21 10/26/2015   AST 19 02/06/2014  ALT 20 10/26/2015   ALT 15 02/06/2014  .  Micro Results Recent Results (from the past 240 hour(s))  MRSA PCR Screening     Status: None   Collection Time: 10/26/15 10:32 PM  Result Value Ref Range Status   MRSA by PCR NEGATIVE NEGATIVE Final    Comment:        The GeneXpert MRSA Assay (FDA approved for NASAL specimens only), is one component of a comprehensive MRSA colonization surveillance program. It is not intended to diagnose MRSA infection nor to guide or monitor treatment for MRSA infections.         Code Status Orders        Start     Ordered   10/26/15 2112  Full code   Continuous     10/26/15 2111    Code Status History    Date Active Date Inactive Code Status Order ID Comments User Context   10/19/2015 10:00 PM 10/21/2015 11:51 PM Full Code [id number]  Gladstone Lighter, MD Inpatient   12/19/2014  9:31 PM 12/21/2014  5:10 PM Full Code [id number]  Charolette Forward, MD Inpatient   12/19/2014  9:23 PM 12/19/2014  9:31  PM Full Code [id number]  Charolette Forward, MD Inpatient   12/19/2014 12:38 PM 12/19/2014  5:18 PM Full Code [id number]  Dionisio Radwan, MD Inpatient   12/18/2014  6:36 PM 12/19/2014 12:38 PM Full Code [id number]  Henreitta Leber, MD Inpatient    Advance Directive Documentation        Most Recent Value   Type of Advance Directive  Living will   Pre-existing out of facility DNR order (yellow form or pink MOST form)     "MOST" Form in Place?            Follow-up Information    Follow up with [facility] .   Specialty:  [place] information:   [address] [place] [phone]      Follow up with Isaias Cowman, MD In 5 days.   Specialty:  Cardiology   Why:  pacemaker   Contact information:   [facility] [place] [address] [phone]       Discharge Medications     Medication List    STOP taking these medications        insulin regular 100 units/mL injection  Commonly known as:  NOVOLIN R,HUMULIN R      TAKE these medications        acetaminophen 325 MG tablet  Commonly known as:  TYLENOL  Take 2 tablets (650 mg total) by mouth every 6 (six) hours as needed for mild pain.     amLODipine 10 MG tablet  Commonly known as:  NORVASC  Take 10 mg by mouth daily.     aspirin EC 81 MG tablet  Take 81 mg by mouth daily.     benazepril 40 MG tablet  Commonly known as:  LOTENSIN  Take 40 mg by mouth 2 (two) times daily.     buPROPion 150 MG 24 hr tablet  Commonly known as:  WELLBUTRIN XL  Take 300 mg by mouth daily.     busPIRone 10 MG tablet  Commonly known as:  BUSPAR  Take 10 mg by mouth at bedtime.     BYDUREON 2 MG Pen  Generic drug:  Exenatide ER  Inject 2 mg into the skin once a week. Pt uses on Sunday.  cetirizine 10 MG tablet  Commonly known as:  ZYRTEC  Take 10 mg by mouth at bedtime as needed for allergies.     clarithromycin 500 MG  tablet  Commonly known as:  BIAXIN  Take 1 tablet (500 mg total) by mouth 2 (two) times daily.     clopidogrel 75 MG tablet  Commonly known as:  PLAVIX  Take 1 tablet (75 mg total) by mouth daily.     DULoxetine 30 MG capsule  Commonly known as:  CYMBALTA  Take 30-60 mg by mouth 2 (two) times daily. Pt takes two capsules in the morning and one at night.     fenofibrate 160 MG tablet  Take 1 tablet (160 mg total) by mouth every evening.     fluticasone 50 MCG/ACT nasal spray  Commonly known as:  FLONASE  Place 2 sprays into both nostrils daily.     hydrochlorothiazide 12.5 MG capsule  Commonly known as:  MICROZIDE  Take 12.5 mg by mouth daily.     insulin aspart 100 UNIT/ML injection  Commonly known as:  novoLOG  Inject 5 Units into the skin 3 (three) times daily with meals.     insulin glargine 100 UNIT/ML injection  Commonly known as:  LANTUS  Inject 0.4 mLs (40 Units total) into the skin at bedtime.     metaxalone 800 MG tablet  Commonly known as:  SKELAXIN  Take 400-800 mg by mouth 3 (three) times daily as needed for muscle spasms.     NARCAN 4 MG/0.1ML Liqd  Generic drug:  Naloxone HCl  Take 4 mg by mouth as needed (for opioid overdose).     nitroGLYCERIN 0.4 MG SL tablet  Commonly known as:  NITROSTAT  Place 1 tablet (0.4 mg total) under the tongue every 5 (five) minutes x 3 doses as needed for chest pain.     omega-3 acid ethyl esters 1 g capsule  Commonly known as:  LOVAZA  Take 1 capsule (1 g total) by mouth 2 (two) times daily.     Oxcarbazepine 300 MG tablet  Commonly known as:  TRILEPTAL  Take 300-600 mg by mouth 2 (two) times daily. Pt takes one tablet in the morning and two at night.     oxyCODONE 5 MG immediate release tablet  Commonly known as:  Oxy IR/ROXICODONE  Take 1 tablet (5 mg total) by mouth 3 (three) times daily as needed for severe pain.     oxyCODONE 20 mg 12 hr tablet  Commonly known as:  OXYCONTIN  Take 1 tablet (20 mg total) by  mouth every 12 (twelve) hours.     pantoprazole 40 MG tablet  Commonly known as:  PROTONIX  Take 40 mg by mouth daily.     pregabalin 200 MG capsule  Commonly known as:  LYRICA  Take 200-400 mg by mouth 2 (two) times daily. Pt takes two capsules in the morning and one at night.     tamsulosin 0.4 MG Caps capsule  Commonly known as:  FLOMAX  Take 0.4 mg by mouth daily after supper.           Total Time in preparing paper work, data evaluation and todays exam - 35 minutes  Dustin Flock M.D on 11/03/2015 at [place]  [phone]

## 2015-11-03 NOTE — Discharge Instructions (Signed)
°  DIET:  °Cardiac diet, carbohydrate diet ° °DISCHARGE CONDITION:  °Stable ° °ACTIVITY:  °Activity as tolerated ° °OXYGEN:  °Home Oxygen: No. °  °Oxygen Delivery: room air ° °DISCHARGE LOCATION:  °home  ° ° °ADDITIONAL DISCHARGE INSTRUCTION: ° ° °If you experience worsening of your admission symptoms, develop shortness of breath, life threatening emergency, suicidal or homicidal thoughts you must seek medical attention immediately by calling 911 or calling your MD immediately  if symptoms less severe. ° °You Must read complete instructions/literature along with all the possible adverse reactions/side effects for all the Medicines you take and that have been prescribed to you. Take any new Medicines after you have completely understood and accpet all the possible adverse reactions/side effects.  ° °Please note ° °You were cared for by a hospitalist during your hospital stay. If you have any questions about your discharge medications or the care you received while you were in the hospital after you are discharged, you can call the unit and asked to speak with the hospitalist on call if the hospitalist that took care of you is not available. Once you are discharged, your primary care physician will handle any further medical issues. Please note that NO REFILLS for any discharge medications will be authorized once you are discharged, as it is imperative that you return to your primary care physician (or establish a relationship with a primary care physician if you do not have one) for your aftercare needs so that they can reassess your need for medications and monitor your lab values. ° ° °

## 2015-11-03 NOTE — Progress Notes (Signed)
Clinical Social Worker was informed that patient will be medically ready to discharge to [facility]. Patient in a agreement with plan. CSW called Broadus John- Admissions Coordinator at [facility] to confirm that patient's bed is ready. Provided patient's room number [id number] and number to call for report [phone] . All discharge information faxed to [facility] via [facility]. RN will call report and patient will discharge to [facility] via EMS.  Ernest Pine, MSW, [place] Social Work Department [phone]

## 2015-11-03 NOTE — Clinical Social Work Placement (Signed)
   CLINICAL SOCIAL WORK PLACEMENT  NOTE  Date:  11/03/2015  Patient Details  Name: Victor Wise MRN: [id number] Date of Birth: July 31, 1950  Clinical Social Work is seeking post-discharge placement for this patient at the [place] level of care (*CSW will initial, date and re-position this form in  chart as items are completed):  Yes   Patient/family provided with [place] Work Department's list of facilities offering this level of care within the geographic area requested by the patient (or if unable, by the patient's family).  Yes   Patient/family informed of their freedom to choose among providers that offer the needed level of care, that participate in Medicare, Medicaid or managed care program needed by the patient, have an available bed and are willing to accept the patient.  Yes   Patient/family informed of [facility]'s ownership interest in [facility] and [facility], as well as of the fact that they are under no obligation to receive care at these facilities.  PASRR submitted to [facility] on 10/30/15     PASRR number received on 10/30/15     Existing PASRR number confirmed on       FL2 transmitted to all facilities in geographic area requested by pt/family on       FL2 transmitted to all facilities within larger geographic area on       Patient informed that his/her managed care company has contracts with or will negotiate with certain facilities, including the following:        Yes   Patient/family informed of bed offers received.  Patient chooses bed at  ([facility])     Physician recommends and patient chooses bed at      Patient to be transferred to  ([facility]) on 11/03/15.  Patient to be transferred to facility by  (EMS)     Patient family notified on 11/03/15 of transfer.  Name of family member notified:   (Daughter)     PHYSICIAN       Additional Comment:    _______________________________________________ Baldemar Lenis, LCSW 11/03/2015, 9:40 AM

## 2015-11-03 NOTE — Progress Notes (Signed)
Report called to kim at [facility]. Patient status and discharge patient work gone over with rn. Patient will be transported via ems. Will give packet to ems upon discharge

## 2015-11-03 NOTE — Progress Notes (Signed)
Ems on the floor to transport patient to [facility]. Iv removed x2. Telemetry box removed. Packet given to ems. No c/o pain no distress noted.

## 2015-11-03 NOTE — Progress Notes (Signed)
CSW contacted Amy with [employer] to obtain insurance auth. Per Carollee Herter was sent yesterday to [facility]. Auth [id number]  Ernest Pine, MSW, [place] Social Work Department [phone]

## 2015-11-03 NOTE — Anesthesia Postprocedure Evaluation (Signed)
Anesthesia Post Note  Patient: Victor Wise  Procedure(s) Performed: Procedure(s) (LRB): INSERTION PACEMAKER (Left)  Patient location during evaluation: PACU Anesthesia Type: MAC Level of consciousness: awake and alert Pain management: satisfactory to patient Vital Signs Assessment: post-procedure vital signs reviewed and stable Respiratory status: nonlabored ventilation Cardiovascular status: stable Anesthetic complications: no    Last Vitals:  Filed Vitals:   11/02/15 1956 11/03/15 0443  BP: 148/69 153/72  Pulse: 60 60  Temp: 36.8 C 36.4 C  Resp: 18 18    Last Pain:  Filed Vitals:   11/03/15 0444  PainSc: 0-No pain                 VAN STAVEREN,Coady Train

## 2015-11-03 NOTE — Progress Notes (Signed)
Ems called for transport. Will removed iv's and telemetry once ems arrives

## 2015-11-05 ENCOUNTER — Ambulatory Visit: Payer: Self-pay | Admitting: *Deleted

## 2015-11-05 DIAGNOSIS — E119 Type 2 diabetes mellitus without complications: Secondary | ICD-10-CM | POA: Diagnosis not present

## 2015-11-05 DIAGNOSIS — K219 Gastro-esophageal reflux disease without esophagitis: Secondary | ICD-10-CM | POA: Diagnosis not present

## 2015-11-05 DIAGNOSIS — I2510 Atherosclerotic heart disease of native coronary artery without angina pectoris: Secondary | ICD-10-CM | POA: Diagnosis not present

## 2015-11-05 DIAGNOSIS — G933 Postviral fatigue syndrome: Secondary | ICD-10-CM | POA: Diagnosis not present

## 2015-11-05 DIAGNOSIS — I10 Essential (primary) hypertension: Secondary | ICD-10-CM | POA: Diagnosis not present

## 2015-11-12 ENCOUNTER — Other Ambulatory Visit: Payer: Self-pay | Admitting: *Deleted

## 2015-11-12 ENCOUNTER — Encounter: Payer: Self-pay | Admitting: *Deleted

## 2015-11-12 DIAGNOSIS — I2510 Atherosclerotic heart disease of native coronary artery without angina pectoris: Secondary | ICD-10-CM | POA: Diagnosis not present

## 2015-11-12 DIAGNOSIS — I6350 Cerebral infarction due to unspecified occlusion or stenosis of unspecified cerebral artery: Secondary | ICD-10-CM | POA: Diagnosis not present

## 2015-11-12 DIAGNOSIS — K219 Gastro-esophageal reflux disease without esophagitis: Secondary | ICD-10-CM | POA: Diagnosis not present

## 2015-11-12 DIAGNOSIS — I10 Essential (primary) hypertension: Secondary | ICD-10-CM | POA: Diagnosis not present

## 2015-11-12 DIAGNOSIS — G933 Postviral fatigue syndrome: Secondary | ICD-10-CM | POA: Diagnosis not present

## 2015-11-12 DIAGNOSIS — E119 Type 2 diabetes mellitus without complications: Secondary | ICD-10-CM | POA: Diagnosis not present

## 2015-11-12 DIAGNOSIS — I495 Sick sinus syndrome: Secondary | ICD-10-CM | POA: Diagnosis not present

## 2015-11-12 NOTE — Patient Outreach (Signed)
[facility]) Care Management  [facility] Social Work  11/12/2015  Victor [facility] 01/26/1951 [id number]  Subjective:  Patient is a 65 year old male, currently in rehab at [facility].  Objective:   Encounter Medications:  Outpatient Encounter Prescriptions as of 11/12/2015  Medication Sig  . acetaminophen (TYLENOL) 325 MG tablet Take 2 tablets (650 mg total) by mouth every 6 (six) hours as needed for mild pain.  [name] amLODipine (NORVASC) 10 MG tablet Take 10 mg by mouth daily.  [name] aspirin EC 81 MG tablet Take 81 mg by mouth daily.  . benazepril (LOTENSIN) 40 MG tablet Take 40 mg by mouth 2 (two) times daily.   [name] buPROPion (WELLBUTRIN XL) 150 MG 24 hr tablet Take 300 mg by mouth daily.   . busPIRone (BUSPAR) 10 MG tablet Take 10 mg by mouth at bedtime.  . cetirizine (ZYRTEC) 10 MG tablet Take 10 mg by mouth at bedtime as needed for allergies.   . clarithromycin (BIAXIN) 500 MG tablet Take 1 tablet (500 mg total) by mouth 2 (two) times daily.  . clopidogrel (PLAVIX) 75 MG tablet Take 1 tablet (75 mg total) by mouth daily.  . DULoxetine (CYMBALTA) 30 MG capsule Take 30-60 mg by mouth 2 (two) times daily. Pt takes two capsules in the morning and one at night.  . Exenatide ER (BYDUREON) 2 MG PEN Inject 2 mg into the skin once a week. Pt uses on Sunday.  . fenofibrate 160 MG tablet Take 1 tablet (160 mg total) by mouth every evening.  . fluticasone (FLONASE) 50 MCG/ACT nasal spray Place 2 sprays into both nostrils daily.  . hydrochlorothiazide (MICROZIDE) 12.5 MG capsule Take 12.5 mg by mouth daily.  . insulin aspart (NOVOLOG) 100 UNIT/ML injection Inject 5 Units into the skin 3 (three) times daily with meals.  . insulin glargine (LANTUS) 100 UNIT/ML injection Inject 0.4 mLs (40 Units total) into the skin at bedtime.  . metaxalone (SKELAXIN) 800 MG tablet Take 400-800 mg by mouth 3 (three) times daily as needed for muscle spasms.  [name] NARCAN 4 MG/0.1ML LIQD Take 4 mg by mouth as needed  (for opioid overdose).   . nitroGLYCERIN (NITROSTAT) 0.4 MG SL tablet Place 1 tablet (0.4 mg total) under the tongue every 5 (five) minutes x 3 doses as needed for chest pain.  [name] omega-3 acid ethyl esters (LOVAZA) 1 g capsule Take 1 capsule (1 g total) by mouth 2 (two) times daily.  . Oxcarbazepine (TRILEPTAL) 300 MG tablet Take 300-600 mg by mouth 2 (two) times daily. Pt takes one tablet in the morning and two at night.  [name] oxyCODONE (OXY IR/ROXICODONE) 5 MG immediate release tablet Take 1 tablet (5 mg total) by mouth 3 (three) times daily as needed for severe pain.  [name] oxyCODONE (OXYCONTIN) 20 mg 12 hr tablet Take 1 tablet (20 mg total) by mouth every 12 (twelve) hours.  . pantoprazole (PROTONIX) 40 MG tablet Take 40 mg by mouth daily.  . pregabalin (LYRICA) 200 MG capsule Take 200-400 mg by mouth 2 (two) times daily. Pt takes two capsules in the morning and one at night.  . tamsulosin (FLOMAX) 0.4 MG CAPS capsule Take 0.4 mg by mouth daily after supper.    No facility-administered encounter medications on file as of 11/12/2015.    Functional Status:  In your present state of health, do you have any difficulty performing the following activities: 10/26/2015 10/26/2015  Hearing? - Y  Vision? - N  Difficulty concentrating  or making decisions? - Y  Walking or climbing stairs? - Y  Dressing or bathing? - Y  Doing errands, shopping? N -  Preparing Food and eating ? - -  Using the Toilet? - -  In the past six months, have you accidently leaked urine? - -  Do you have problems with loss of bowel control? - -  Managing your Medications? - -  Managing your Finances? - -  Housekeeping or managing your Housekeeping? - -    Fall/Depression Screening:  PHQ 2/9 Scores 10/08/2015 09/08/2015 08/12/2015 07/27/2015 07/22/2015 04/03/2015 01/20/2015  PHQ - 2 Score _0 - _1 PHQ- 9 Score _2 - _3 Exception Documentation - - - Patient refusal - - -    Assessment: This social worker spoke with  [facility], discharge planner regarding status of patient's stay in rehab.  Per [facility], patient is out of covered days.  Patient has filed an appeal, if the appeal is denied patient will have to discharge on [facility].  Per [facility], patient's daughter came yesterday and took patient to sign a lease for an apartment. Patient will discharge home with home health services.  This Education officer, museum met with patietn in his room following his PT/OT treatment.  Per patient, his knees are still weak and he feels that despite participation in treatment he is not ready to go home at this time. Per patient, he has an appointment with an Orthopaedic doctor tomorrow at 1:45pm.   Patient states that he continues to fall, even while in rehab.  Patient states that he is using his walker, however get's "off centered".  Per patient, he has not signed a lease yet, he is still waiting to hear if he has been approved for the rental or not.  Per patient, he should hear something by today.  Patient's daughter is helping with this process and will follow up.  If not approved, patient will reside temporarily with his daughter.  Plan: This social worker will follow up with patient on 11/13/15 regarding the status of his appeal and tentative discharge plans.

## 2015-11-13 ENCOUNTER — Ambulatory Visit: Payer: Self-pay | Admitting: *Deleted

## 2015-11-13 ENCOUNTER — Other Ambulatory Visit: Payer: Self-pay | Admitting: *Deleted

## 2015-11-13 DIAGNOSIS — S42402A Unspecified fracture of lower end of left humerus, initial encounter for closed fracture: Secondary | ICD-10-CM | POA: Diagnosis not present

## 2015-11-13 NOTE — Patient Outreach (Addendum)
[facility]) Care Management  11/13/2015  TENOR CASA 1950/06/26 [id number]   Phone call to patient to follow up on appeal related to his stay in rehab.  Patient did not answer.  This Education officer, museum contacted patient's daughter [name],  on [facility] consent.  Per patient's daughter, the appeal was denied, however they have filed a second level appeal and have left a message with the [facility] case manager as well. Per patient's daughter, patient has fallen again today and now has a cast on his elbow.  Patient's daughter states that patient can no longer reside with his sister and there is no room for him at her home.  For safety, her hope is for him to  remain at [facility] term, or an Assisted Living.  She has also been trying to find an apartment for patient.    She has completed patient's Medicaid application for long term care and needs to submit it to social services. Phone call to social services confirmed that patient does not need his birth certificate to apply, just photo identification.  She was concerned about patient's birth certificate as they cannot locate it at this time.  He was born in [place].  Plan:  This Education officer, museum will follow up with patient and patient's daughter within in 1 week to assist with patient's long term placement plan.   [place], [place] Management [place], [place] Care Management [phone]

## 2015-11-19 DIAGNOSIS — I10 Essential (primary) hypertension: Secondary | ICD-10-CM | POA: Diagnosis not present

## 2015-11-19 DIAGNOSIS — E1149 Type 2 diabetes mellitus with other diabetic neurological complication: Secondary | ICD-10-CM | POA: Diagnosis not present

## 2015-11-19 DIAGNOSIS — I110 Hypertensive heart disease with heart failure: Secondary | ICD-10-CM | POA: Diagnosis not present

## 2015-11-19 DIAGNOSIS — I2510 Atherosclerotic heart disease of native coronary artery without angina pectoris: Secondary | ICD-10-CM | POA: Diagnosis not present

## 2015-11-19 DIAGNOSIS — I2109 ST elevation (STEMI) myocardial infarction involving other coronary artery of anterior wall: Secondary | ICD-10-CM | POA: Diagnosis not present

## 2015-11-19 DIAGNOSIS — E782 Mixed hyperlipidemia: Secondary | ICD-10-CM | POA: Diagnosis not present

## 2015-11-19 DIAGNOSIS — I5032 Chronic diastolic (congestive) heart failure: Secondary | ICD-10-CM | POA: Diagnosis not present

## 2015-11-19 DIAGNOSIS — I63511 Cerebral infarction due to unspecified occlusion or stenosis of right middle cerebral artery: Secondary | ICD-10-CM | POA: Diagnosis not present

## 2015-11-20 ENCOUNTER — Ambulatory Visit: Payer: Self-pay | Admitting: *Deleted

## 2015-11-20 ENCOUNTER — Other Ambulatory Visit: Payer: Self-pay | Admitting: *Deleted

## 2015-11-20 NOTE — Patient Outreach (Signed)
[facility]) Care Management  11/20/2015  Victor [facility] 07-12-1950 [id number]   Return phone call from [name] on 11/19/15, discharge planner at [facility] stating that patient fell and broke hs elbow while in rehab and now has a cast.  Patient's discharge has been delayed.  There is no discharge date at this time.  Patient's daughter involved and is making efforts to secure a place for patient post discharge.  She has completed the Medicaid application for long term care for patient and will submit it.  Tentative discharge plan to an Assisted Living now being considered.   [name] [facility] Care Management [phone]

## 2015-11-24 ENCOUNTER — Other Ambulatory Visit: Payer: Self-pay | Admitting: *Deleted

## 2015-11-24 DIAGNOSIS — G894 Chronic pain syndrome: Secondary | ICD-10-CM | POA: Diagnosis not present

## 2015-11-24 DIAGNOSIS — E119 Type 2 diabetes mellitus without complications: Secondary | ICD-10-CM | POA: Diagnosis not present

## 2015-11-24 DIAGNOSIS — N390 Urinary tract infection, site not specified: Secondary | ICD-10-CM | POA: Diagnosis not present

## 2015-11-24 DIAGNOSIS — I10 Essential (primary) hypertension: Secondary | ICD-10-CM | POA: Diagnosis not present

## 2015-11-24 DIAGNOSIS — I495 Sick sinus syndrome: Secondary | ICD-10-CM | POA: Diagnosis not present

## 2015-11-24 DIAGNOSIS — K219 Gastro-esophageal reflux disease without esophagitis: Secondary | ICD-10-CM | POA: Diagnosis not present

## 2015-11-24 DIAGNOSIS — I6350 Cerebral infarction due to unspecified occlusion or stenosis of unspecified cerebral artery: Secondary | ICD-10-CM | POA: Diagnosis not present

## 2015-11-24 DIAGNOSIS — I2510 Atherosclerotic heart disease of native coronary artery without angina pectoris: Secondary | ICD-10-CM | POA: Diagnosis not present

## 2015-11-24 DIAGNOSIS — G933 Postviral fatigue syndrome: Secondary | ICD-10-CM | POA: Diagnosis not present

## 2015-11-24 NOTE — Patient Outreach (Signed)
[name]) Care Management  11/24/2015  Victor [facility] [birth year] [id number]   Phone call to patient and patient's daughter to discuss progress in rehab and status of Medicaid application.  Per patient, he still hast cast on elbow but is able to participate in therapy, just can't put a lot of weight on left arm. He is not sure about the status of his medicaid application or where he will go post discharge from rehab.  Voicemail message left with daughter for a return call.  Spoke with discharge planner, [name], patient doing well in rehab, actively participating.  No discharge date set yet.  Per [name], he will meet with the therapy team on 11/25/15 and will have more information then.   Plan:  This social worker will follow up with discharge planner on 11/25/15.   [name] [facility] Care Management [phone]

## 2015-11-25 ENCOUNTER — Other Ambulatory Visit: Payer: Self-pay | Admitting: *Deleted

## 2015-11-25 NOTE — Patient Outreach (Signed)
[facility]) Care Management  11/25/2015  [facility] Aug 19, 1950 [id number]   Phone call to discharge planner [name] to discuss patient's status while in rehab.  Voicemail message left requesting a return call  Return phone call from patient's daughter,[name] who stated that patient has been authorized additional days in rehab. Per patient's daughter, he will be evaluated from week to week.  There is no tentative discharge date at this time. Patient's long term medicaid application has been submitted.  Patient's daughter has also submitted an application for a low income apartment in [place].  Patient's daughter has looked in to senior housing at [employer] [facility] [employer], however the waiting list could be over a year.  Per patient's daughter, she would like for patient to remain in long term care but if medicaid does not approve him, he will have to discharge to an apartment on his own.  Plan:  This Education officer, museum will continue to follow patient, and collaborate with patient's daughter and discharge planner to assist with discharge planning needs.   [name] [facility] Care Management [phone]

## 2015-11-27 DIAGNOSIS — S42402D Unspecified fracture of lower end of left humerus, subsequent encounter for fracture with routine healing: Secondary | ICD-10-CM | POA: Diagnosis not present

## 2015-12-02 ENCOUNTER — Ambulatory Visit: Payer: Self-pay | Admitting: *Deleted

## 2015-12-03 ENCOUNTER — Other Ambulatory Visit: Payer: Self-pay | Admitting: *Deleted

## 2015-12-03 NOTE — Patient Outreach (Addendum)
[facility]) Care Management  12/03/2015  Victor [facility] 05-26-1950 [id number]   Phone call to [facility], discharge planner at [facility] to discuss patient's progress in rehab  Per [facility], patient has not been authorized any additional days and will have to discharge on Saturday, 12/05/15 if appeal does not go through.  Patient's daughter continues to work on discharge residence.  Medicaid application pending got long term care.  Patient's daughter has also applied for a low income apartment for patient which is also pending.  Patient's daughter considering paying out of pocket for the first month of Assisted Living care until the Medicaid goes through.  Per discharge planner, patient is doing well overall.  Tentative discharge scheduled for 12/05/15 with [facility] PT/OT if appeal does not go through.  SNF visit scheduled for 12/04/15.  [facility] Care Management [phone]

## 2015-12-04 ENCOUNTER — Other Ambulatory Visit: Payer: Self-pay | Admitting: *Deleted

## 2015-12-04 NOTE — Patient Outreach (Signed)
[facility]) Care Management  [facility] Social Work  12/04/2015  ENGLISH CRAIGHEAD March 31, 1951 [id number]  Subjective:    Objective:   Encounter Medications:  Outpatient Encounter Prescriptions as of 12/04/2015  Medication Sig  . acetaminophen (TYLENOL) 325 MG tablet Take 2 tablets (650 mg total) by mouth every 6 (six) hours as needed for mild pain.  [name] amLODipine (NORVASC) 10 MG tablet Take 10 mg by mouth daily.  [name] aspirin EC 81 MG tablet Take 81 mg by mouth daily.  . benazepril (LOTENSIN) 40 MG tablet Take 40 mg by mouth 2 (two) times daily.   [name] buPROPion (WELLBUTRIN XL) 150 MG 24 hr tablet Take 300 mg by mouth daily.   . busPIRone (BUSPAR) 10 MG tablet Take 10 mg by mouth at bedtime.  . cetirizine (ZYRTEC) 10 MG tablet Take 10 mg by mouth at bedtime as needed for allergies.   . clarithromycin (BIAXIN) 500 MG tablet Take 1 tablet (500 mg total) by mouth 2 (two) times daily.  . clopidogrel (PLAVIX) 75 MG tablet Take 1 tablet (75 mg total) by mouth daily.  . DULoxetine (CYMBALTA) 30 MG capsule Take 30-60 mg by mouth 2 (two) times daily. Pt takes two capsules in the morning and one at night.  . Exenatide ER (BYDUREON) 2 MG PEN Inject 2 mg into the skin once a week. Pt uses on Sunday.  . fenofibrate 160 MG tablet Take 1 tablet (160 mg total) by mouth every evening.  . fluticasone (FLONASE) 50 MCG/ACT nasal spray Place 2 sprays into both nostrils daily.  . hydrochlorothiazide (MICROZIDE) 12.5 MG capsule Take 12.5 mg by mouth daily.  . insulin aspart (NOVOLOG) 100 UNIT/ML injection Inject 5 Units into the skin 3 (three) times daily with meals.  . insulin glargine (LANTUS) 100 UNIT/ML injection Inject 0.4 mLs (40 Units total) into the skin at bedtime.  . metaxalone (SKELAXIN) 800 MG tablet Take 400-800 mg by mouth 3 (three) times daily as needed for muscle spasms.  [name] NARCAN 4 MG/0.1ML LIQD Take 4 mg by mouth as needed (for opioid overdose).   . nitroGLYCERIN (NITROSTAT) 0.4 MG SL  tablet Place 1 tablet (0.4 mg total) under the tongue every 5 (five) minutes x 3 doses as needed for chest pain.  [name] omega-3 acid ethyl esters (LOVAZA) 1 g capsule Take 1 capsule (1 g total) by mouth 2 (two) times daily.  . Oxcarbazepine (TRILEPTAL) 300 MG tablet Take 300-600 mg by mouth 2 (two) times daily. Pt takes one tablet in the morning and two at night.  [name] oxyCODONE (OXY IR/ROXICODONE) 5 MG immediate release tablet Take 1 tablet (5 mg total) by mouth 3 (three) times daily as needed for severe pain.  [name] oxyCODONE (OXYCONTIN) 20 mg 12 hr tablet Take 1 tablet (20 mg total) by mouth every 12 (twelve) hours.  . pantoprazole (PROTONIX) 40 MG tablet Take 40 mg by mouth daily.  . pregabalin (LYRICA) 200 MG capsule Take 200-400 mg by mouth 2 (two) times daily. Pt takes two capsules in the morning and one at night.  . tamsulosin (FLOMAX) 0.4 MG CAPS capsule Take 0.4 mg by mouth daily after supper.    No facility-administered encounter medications on file as of 12/04/2015.    Functional Status:  In your present state of health, do you have any difficulty performing the following activities: 10/26/2015 10/26/2015  Hearing? - Y  Vision? - N  Difficulty concentrating or making decisions? - Y  Walking or climbing stairs? - Y  Dressing or bathing? - Y  Doing errands, shopping? N -  Preparing Food and eating ? - -  Using the Toilet? - -  In the past six months, have you accidently leaked urine? - -  Do you have problems with loss of bowel control? - -  Managing your Medications? - -  Managing your Finances? - -  Housekeeping or managing your Housekeeping? - -    Fall/Depression Screening:  PHQ 2/9 Scores 10/08/2015 09/08/2015 08/12/2015 07/27/2015 07/22/2015 04/03/2015 01/20/2015  PHQ - 2 Score 3 6 6  - 4 6 6   PHQ- 9 Score 19 16 24  - 19 19 24   Exception Documentation - - - Patient refusal - - -    Assessment: Met with patient at [facility], skilled nursing facility. Due to medical bills and credit  card debt, patient does not want to consider group home or assisted living care. "This is non-negotiable"   Patient will consider staying  in a hotel until apartment becomes available.  Patient continues to have a cast on arm, and has been told to return in 2 to 3 weeks for follow up. Patient has had no falls since fracture to elbow.  Appeal filed, not sure of the result, will know by the end of the day today.  1500.00 social securtiy per month.  Spoke with patient's daughter who states that the appeal did not go through. Patient has not yet been approved for the apartment.  She would like for patient to discharge to a group home and is willing to assist patient with this transition.  This Education officer, museum explained the process of group home admission(TB test, PASARR number , and FL2 from doctor) and will begin the search to identify available beds, however patient will have to discharge to an extended stay until the family care home process is complete.  Plan:  This social worker will follow up with patient and daughter regarding transition to a family care home.   [name] [facility] Care Management [phone]

## 2015-12-07 ENCOUNTER — Other Ambulatory Visit: Payer: Self-pay | Admitting: *Deleted

## 2015-12-07 NOTE — Patient Outreach (Signed)
[name]) Care Management  12/07/2015  [facility] 1951/05/05 [id number]   Follow up phone call to patient following his SNF stay. [facility]   Per patient, he is now at the [facility], RN 114.    HH has not contacted patient to date.  It was explained that based on patient's asset's it is likely that patient may not qualify for Medicaid.  Per patient, he is not able to pay out of pocket for facility care.  Per patient, his daughter is still working to submit all documentation needed for eligibility for the apartment building.  Patient will remain at the [facility] until apartment is ready.   Per patient, he has a tall mini  refrigerator and microwave in the room.  Freezer section not very big.  Daughter visits daily to bring meals.  Has family friend who will check on patient while his daughter is on vacation.  Patient has a follow up care appointment with primary care office on 12/14/15.  Per patient he has a C-PAP machine, however does not have his insulin meter.  Per patient, his daughter will bring meter but not sure when, possibly tomorrow.     Late Entry-Phone call from patient's daughter on 12/07/15 at 5:30pm.  Per patient's daughter, she does not know where his glucometer is or his glasses.  She discussed that he has changed his mind about need for a wheelchair and has requested assistance in getting one ordered. The possibility that patient may no qualify for Medicaid discussed, however patient's daughter plans to follow through with the application process.  She is also  continuing to follow up with the apartment that they have applied for patient     Plan:  CSW to arrange transportation through [facility] for Monday with Dr. Laurelyn Sickle office. CSW will follow up with [facility] order.  CSW will accompany patient to providers office for continued support. CSW will visit patient on 12/08/15 to bring glucometer and reading glasses.   [name] [facility] Care Management [phone]

## 2015-12-07 NOTE — Patient Outreach (Addendum)
[facility]) Care Management  12/07/2015  Victor [facility] [birth year] [id number]   Transition of care  Telephone call to [facility] as part of transition of care, conference call with [employer] , LCSW HIPAA verified. Victor Wise was discharged from [facility] on 7/22 and is now staying at [facility] in [place]. Victor [facility] states that he has all of his medications, including insulin and has a refrigerator in room in order to store it. Victor [facility] has his CPAP for use, he does not have his blood glucose meter he is not sure whether his Wise has it are not. Patient is using a rolling walker, states they suggested a wheel chair at the skilled facility but he declined, but now states he wishes that he had one.  Victor Wise discussed that he has lost weight since being in rehab, but does not have a scale at hotel to weigh on. Patient was recently discharged from hospital and all medications have been reviewed. Patient has not heard from Home health that was arranged prior at discharge from rehab.    Plan Will explore  resources available to assist with getting patient a blood sugar meter sooner and  scales to weigh on. Patient will remain active with transition of care program and weekly outreaches, will plan home visit within next week or sooner . Will contact home health services regarding patient services.   THN CM Care Plan Problem One   Flowsheet Row Most Recent Value  Care Plan Problem One  (P) High Risk for readmission to hospital related as evidenced by recent discharge   Role Documenting the Problem One  (P) Care Management [place] for Problem One  (P) Active  THN Long Term Goal (31-90 days)  (P) Patient will report no hospital admission in the next 31 days  THN Long Term Goal Start Date  (P) 12/07/15  Interventions for Problem One Long Term Goal  (P) Educated on the transition of care program, with weekly ourtreaches, importance of PCP follow up, and  notifying of worsening symptoms  THN CM Short Term Goal #1 (0-30 days)  (P) Patient will report obtaining meter and begin checking his blood sugar daily and recording in the next 7 days   THN CM Short Term Goal #1 Start Date  (P) 12/07/15  Interventions for Short Term Goal #1  (P) call placed to Victor daugther regarding his meter,discussed with patient the importance of checking blood sugar she taking insulin daily,  RN will explore options for getting patient meter sooner.   THN CM Short Term Goal #2 (0-30 days)  (P) Patient will begin to participate in home health therapy in the next 7 days.   THN CM Short Term Goal #2 Start Date  (P) 12/07/15  Interventions for Short Term Goal #2  (P) RN will notify home health agency, to follow up when services will begin .      Victor Draft, RN, [place] Management [phone]- Mobile [phone]- Toll Free Main Office

## 2015-12-07 NOTE — Patient Outreach (Signed)
[facility]) Care Management  12/07/2015  Victor Wise [birth year] [id number]   Phone call from [facility], discharge planner at [facility] who stated that it was highly unlikely that patient would qualify for Medicaid due to the amount of assets that patient has.  Per [facility], patient has confirmed that he  owns a home that he is currently renting out. Per [facility], [facility] has been ordered and will be following patient at the [facility].   Plan:  This Education officer, museum will complete transition of care call with RNCM to discuss plan for housing.     Victor Wise [facility] Care Management [phone]

## 2015-12-08 ENCOUNTER — Encounter: Payer: Self-pay | Admitting: *Deleted

## 2015-12-08 ENCOUNTER — Other Ambulatory Visit: Payer: Self-pay | Admitting: *Deleted

## 2015-12-08 NOTE — Patient Outreach (Signed)
Victor Wise) Care Management   12/08/2015  Victor Wise Oct 02, 1950 [id number]  Victor Wise is an 65 y.o. male  Subjective:  Victor Wise discussed his current living situation at [facility] since discharged from [facility] on 6/23 ,  until his apartment is available. Victor Wise discussed that he has received phone call from [facility] for home health services, RN to call on 7/26 to set up visit time for 7/27.  Victor Wise discussed that his daughter will pick up his wheel chair from [facility] home [facility] on today, patient now his glasses .   Objective:   Vitals:   12/08/15 1357  BP: 118/70  Resp: 18  Heart rate 60, oxygen saturation 97 % Patient in handicap assessible  hotel room, has mini refrigerator and microwave available. Review of Systems  Constitutional: Negative.   HENT: Negative.   Eyes: Negative.   Respiratory: Negative.   Cardiovascular: Negative.   Gastrointestinal: Negative.   Genitourinary: Negative.   Musculoskeletal: Positive for joint pain.       Left elbow with cast  Skin: Negative.   Psychiatric/Behavioral: Negative for depression. The patient does not have insomnia.     Physical Exam  Constitutional: He is oriented to person, place, and time. He appears well-developed and well-nourished.  Cardiovascular: Normal rate, normal heart sounds and intact distal pulses.   Respiratory: Effort normal and breath sounds normal.  GI: Soft.  Musculoskeletal:  Cast on left arm,   Neurological: He is alert and oriented to person, place, and time.  Skin: Skin is warm and dry.  Skin on bottom of bilateral feet dry flaky, peeling  Left subclavian area pacer site healed.  Psychiatric: He has a normal mood and affect. His behavior is normal. Judgment and thought content normal.    Encounter Medications:   Outpatient Encounter Prescriptions as of 12/08/2015  Medication Sig  . amLODipine (NORVASC) 10 MG tablet Take 10 mg by mouth daily.  [name] aspirin EC 81 MG tablet  Take 81 mg by mouth daily.  . benazepril (LOTENSIN) 40 MG tablet Take 40 mg by mouth 2 (two) times daily.   [name] buPROPion (WELLBUTRIN XL) 150 MG 24 hr tablet Take 300 mg by mouth daily.   . busPIRone (BUSPAR) 10 MG tablet Take 10 mg by mouth at bedtime.  . clopidogrel (PLAVIX) 75 MG tablet Take 1 tablet (75 mg total) by mouth daily.  . DULoxetine (CYMBALTA) 30 MG capsule Take 30-60 mg by mouth 2 (two) times daily. Pt takes two capsules in the morning and one at night.  . Exenatide ER (BYDUREON) 2 MG PEN Inject 2 mg into the skin once a week. Pt uses on Sunday.  . fenofibrate 160 MG tablet Take 1 tablet (160 mg total) by mouth every evening.  . fluticasone (FLONASE) 50 MCG/ACT nasal spray Place 2 sprays into both nostrils daily.  . hydrochlorothiazide (MICROZIDE) 12.5 MG capsule Take 12.5 mg by mouth daily.  . insulin aspart (NOVOLOG) 100 UNIT/ML injection Inject 5 Units into the skin 3 (three) times daily with meals.  . insulin glargine (LANTUS) 100 UNIT/ML injection Inject 0.4 mLs (40 Units total) into the skin at bedtime.  . metaxalone (SKELAXIN) 800 MG tablet Take 400-800 mg by mouth 3 (three) times daily as needed for muscle spasms.  [name] NARCAN 4 MG/0.1ML LIQD Take 4 mg by mouth as needed (for opioid overdose).   . nitroGLYCERIN (NITROSTAT) 0.4 MG SL tablet Place 1 tablet (0.4 mg total) under the tongue every 5 (  five) minutes x 3 doses as needed for chest pain.  [name] omega-3 acid ethyl esters (LOVAZA) 1 g capsule Take 1 capsule (1 g total) by mouth 2 (two) times daily.  . Oxcarbazepine (TRILEPTAL) 300 MG tablet Take 300-600 mg by mouth 2 (two) times daily. Pt takes one tablet in the morning and two at night.  [name] oxyCODONE (OXY IR/ROXICODONE) 5 MG immediate release tablet Take 1 tablet (5 mg total) by mouth 3 (three) times daily as needed for severe pain.  [name] oxyCODONE (OXYCONTIN) 20 mg 12 hr tablet Take 1 tablet (20 mg total) by mouth every 12 (twelve) hours.  . pantoprazole (PROTONIX) 40 MG tablet  Take 40 mg by mouth daily.  . pregabalin (LYRICA) 200 MG capsule Take 200-400 mg by mouth 2 (two) times daily. Pt takes two capsules in the morning and one at night.  [name] acetaminophen (TYLENOL) 325 MG tablet Take 2 tablets (650 mg total) by mouth every 6 (six) hours as needed for mild pain. (Patient not taking: Reported on 12/07/2015)  . cetirizine (ZYRTEC) 10 MG tablet Take 10 mg by mouth at bedtime as needed for allergies.   . clarithromycin (BIAXIN) 500 MG tablet Take 1 tablet (500 mg total) by mouth 2 (two) times daily. (Patient not taking: Reported on 12/07/2015)  . tamsulosin (FLOMAX) 0.4 MG CAPS capsule Take 0.4 mg by mouth daily after supper.    No facility-administered encounter medications on file as of 12/08/2015.     Functional Status:   In your present state of health, do you have any difficulty performing the following activities: 12/08/2015 10/26/2015  Hearing? [place]? N -  Difficulty concentrating or making decisions? Y -  Walking or climbing stairs? Y -  Dressing or bathing? N -  Doing errands, shopping? Y N  Preparing Food and eating ? Y -  Using the Toilet? N -  In the past six months, have you accidently leaked urine? Y -  Do you have problems with loss of bowel control? N -  Managing your Medications? N -  Managing your Finances? N -  Housekeeping or managing your Housekeeping? Y -  Some recent data might be hidden    Fall/Depression Screening:    PHQ 2/9 Scores 10/08/2015 09/08/2015 08/12/2015 07/27/2015 07/22/2015 04/03/2015 01/20/2015  PHQ - 2 Score 3 6 6  - 4 6 6   PHQ- 9 Score 19 16 24  - 19 19 24   Exception Documentation - - - Patient refusal - - -   Fall Risk  12/08/2015 10/08/2015 09/08/2015 08/12/2015 07/27/2015  Falls in the past year? Yes Yes Yes Yes Yes  Number falls in past yr: 2 or more 2 or more 2 or more 2 or more 2 or more  Injury with Fall? No No No No No  Risk Factor Category  High Fall Risk High Fall Risk - High Fall Risk High Fall Risk  Risk for fall  due to : Impaired balance/gait;History of fall(s) History of fall(s);Impaired balance/gait History of fall(s);Impaired balance/gait;Impaired mobility History of fall(s);Impaired balance/gait;Impaired mobility History of fall(s);Impaired balance/gait  Risk for fall due to (comments): - - - - -  Follow up Falls evaluation completed;Falls prevention discussed Falls prevention discussed Falls prevention discussed Falls evaluation completed;Falls prevention discussed Falls prevention discussed   Assessment:  Transition of care home visit, co visit with [place], LCSW. Patient was recently discharged from hospital and all medications have been reviewed. Patient is using medication in blister pak that he received at discharge  from skilled facility.   High Fall Risk - using quad cane, required reminding to use during visit. Fall prevention education reviewed. Will benefit from home health therapy, [facility] home health agency to follow . Patient maneuvering around room well, some difficulty with rising from front side of bed, patient able to get up from bed on the back side of bed due to having cast on left arm. .   Diabetes- Patient has all ordered insulin and medications. CBG meter supplied today, no charge for meter at patient [place] when strips purchased. Will need education on use of meter.CBG today 107 at visit. Patient has denied having low blood sugar symptoms.  Heart Failure - Patient discussed that he has recently lost some weight, since stay in rehab. Does not have scales currently at motel. Note with list of his medical supplies that he needs written for daughter to pick up . Patient agreeable to weighing daily . Reinforced importance of watching salt in diet since most of food he has access to is canned .  Reviewed label on food at his room for salt content. He has had recent office visit with  cardiology  .   Patient has  upcoming appointment at PCP office and orthopedic office,  [facility] social worker has arranged transportation.  Plan:  Will remain active with weekly transition of care outreaches, telephonic call scheduled in the next week. Patient will monitor blood sugars 3 times a day and record Patient will begin to weigh daily and record Placed call to Dr.Fozia to request prescription for strips for meter. Provided and reviewed EMMI handouts on Preventing falls, being salt smart and Diabetes day to day care.  THN CM Care Plan Problem One   Flowsheet Row Most Recent Value  Care Plan Problem One  High Risk for readmission to hospital related as evidenced by recent discharge   Role Documenting the Problem One  Care Management [place] for Problem One  Active  THN Long Term Goal (31-90 days)  Patient will report no hospital admission in the next 31 days  THN Long Term Goal Start Date  12/07/15  Interventions for Problem One Long Term Goal  Educated on the transition of care program, with weekly ourtreaches, importance of PCP follow up, and notifying of worsening symptoms  THN CM Short Term Goal #1 (0-30 days)  Patient will report obtaining meter and begin checking his blood sugar daily and recording in the next 7 days   THN CM Short Term Goal #1 Start Date  12/07/15  Interventions for Short Term Goal #1  CBG meter from [facility] and strips, educated patient on use of CBG meter with teachback, demonstrates proper use.  THN CM Short Term Goal #2 (0-30 days)  Patient will begin to participate in home health therapy in the next 7 days.   THN CM Short Term Goal #2 Start Date  12/07/15  Interventions for Short Term Goal #2  Verified that patient has been contacted regarding when services are to begin   [facility] CM Short Term Goal #3 (0-30 days)  Patient will obtain scales and begin to weigh daily and record in the next 30 days   THN CM Short Term Goal #3 Start Date  12/08/15  Interventions for Short [place]ern Goal #3  Discussed with patient getting his scales from  his sisters home, he has written a note for his daughter to obtain his scales      Joylene Draft, RN, [place]  Management [phone]- Mobile [phone]- [place]

## 2015-12-08 NOTE — Patient Outreach (Signed)
[facility]) Care Management  [facility] Social Work  12/08/2015  Victor Wise 1950/07/14 [id number]  Subjective:  Patient has discharged from [facility] and is now staying at [facility] until his apartment becomes avaialble.  Objective:   Encounter Medications:  Outpatient Encounter Prescriptions as of 12/08/2015  Medication Sig  . acetaminophen (TYLENOL) 325 MG tablet Take 2 tablets (650 mg total) by mouth every 6 (six) hours as needed for mild pain. (Patient not taking: Reported on 12/07/2015)  . amLODipine (NORVASC) 10 MG tablet Take 10 mg by mouth daily.  [name] aspirin EC 81 MG tablet Take 81 mg by mouth daily.  . benazepril (LOTENSIN) 40 MG tablet Take 40 mg by mouth 2 (two) times daily.   [name] buPROPion (WELLBUTRIN XL) 150 MG 24 hr tablet Take 300 mg by mouth daily.   . busPIRone (BUSPAR) 10 MG tablet Take 10 mg by mouth at bedtime.  . cetirizine (ZYRTEC) 10 MG tablet Take 10 mg by mouth at bedtime as needed for allergies.   . clarithromycin (BIAXIN) 500 MG tablet Take 1 tablet (500 mg total) by mouth 2 (two) times daily. (Patient not taking: Reported on 12/07/2015)  . clopidogrel (PLAVIX) 75 MG tablet Take 1 tablet (75 mg total) by mouth daily.  . DULoxetine (CYMBALTA) 30 MG capsule Take 30-60 mg by mouth 2 (two) times daily. Pt takes two capsules in the morning and one at night.  . Exenatide ER (BYDUREON) 2 MG PEN Inject 2 mg into the skin once a week. Pt uses on Sunday.  . fenofibrate 160 MG tablet Take 1 tablet (160 mg total) by mouth every evening.  . fluticasone (FLONASE) 50 MCG/ACT nasal spray Place 2 sprays into both nostrils daily.  . hydrochlorothiazide (MICROZIDE) 12.5 MG capsule Take 12.5 mg by mouth daily.  . insulin aspart (NOVOLOG) 100 UNIT/ML injection Inject 5 Units into the skin 3 (three) times daily with meals.  . insulin glargine (LANTUS) 100 UNIT/ML injection Inject 0.4 mLs (40 Units total) into the skin at bedtime.  . metaxalone (SKELAXIN) 800 MG tablet  Take 400-800 mg by mouth 3 (three) times daily as needed for muscle spasms.  [name] NARCAN 4 MG/0.1ML LIQD Take 4 mg by mouth as needed (for opioid overdose).   . nitroGLYCERIN (NITROSTAT) 0.4 MG SL tablet Place 1 tablet (0.4 mg total) under the tongue every 5 (five) minutes x 3 doses as needed for chest pain.  [name] omega-3 acid ethyl esters (LOVAZA) 1 g capsule Take 1 capsule (1 g total) by mouth 2 (two) times daily.  . Oxcarbazepine (TRILEPTAL) 300 MG tablet Take 300-600 mg by mouth 2 (two) times daily. Pt takes one tablet in the morning and two at night.  [name] oxyCODONE (OXY IR/ROXICODONE) 5 MG immediate release tablet Take 1 tablet (5 mg total) by mouth 3 (three) times daily as needed for severe pain.  [name] oxyCODONE (OXYCONTIN) 20 mg 12 hr tablet Take 1 tablet (20 mg total) by mouth every 12 (twelve) hours.  . pantoprazole (PROTONIX) 40 MG tablet Take 40 mg by mouth daily.  . pregabalin (LYRICA) 200 MG capsule Take 200-400 mg by mouth 2 (two) times daily. Pt takes two capsules in the morning and one at night.  . tamsulosin (FLOMAX) 0.4 MG CAPS capsule Take 0.4 mg by mouth daily after supper.    No facility-administered encounter medications on file as of 12/08/2015.     Functional Status:  In your present state of health, do you have any  difficulty performing the following activities: 10/26/2015 10/26/2015  Hearing? - Y  Vision? - N  Difficulty concentrating or making decisions? - Y  Walking or climbing stairs? - Y  Dressing or bathing? - Y  Doing errands, shopping? N -  Preparing Food and eating ? - -  Using the Toilet? - -  In the past six months, have you accidently leaked urine? - -  Do you have problems with loss of bowel control? - -  Managing your Medications? - -  Managing your Finances? - -  Housekeeping or managing your Housekeeping? - -  Some recent data might be hidden    Fall/Depression Screening:  PHQ 2/9 Scores 10/08/2015 09/08/2015 08/12/2015 07/27/2015 07/22/2015 04/03/2015 01/20/2015   PHQ - 2 Score 3 6 6  - 4 6 6   PHQ- 9 Score 19 16 24  - 19 19 24   Exception Documentation - - - Patient refusal - - -    Assessment:  Co-visit with RNCM and social worker to see patient at his current residence at the [facility] Room 314.  Patient to receive home health  Services through  [place].  RN from [facility] to come to see patient on Thursday.  Patient's daughter will pick up patient's wheelchair from [place] today.  Patient in a handicapped accessible room and has a quad cain as well.  Patient encouraged to continue to use quad cane to increase safety and avoid falls.  Patient has an appointment  with [facility]  On 12/14/15 at 11:00am.  Transportation arranged through [employer] -ride assist. [place] number [place] up 10:15am. Phone number to pick up patient [phone].  Transportation also arranged for appointment with [employer], [place] 12/16/15 at 10:15am. Pick up time,  Between 9:15 and 9;45am Phone number to pick up patient [phone].     [place] number [phone]  Me[employer] Wheels referral made. [phone] patient currently on waiting list. 100 people on list.  Patient has a mini refrigerator and microwave in his room.  Plan: social worker to accompany patient to primary care doctor's appointment on 12/14/15.

## 2015-12-10 DIAGNOSIS — F329 Major depressive disorder, single episode, unspecified: Secondary | ICD-10-CM | POA: Diagnosis not present

## 2015-12-10 DIAGNOSIS — I10 Essential (primary) hypertension: Secondary | ICD-10-CM | POA: Diagnosis not present

## 2015-12-10 DIAGNOSIS — Z48812 Encounter for surgical aftercare following surgery on the circulatory system: Secondary | ICD-10-CM | POA: Diagnosis not present

## 2015-12-10 DIAGNOSIS — G894 Chronic pain syndrome: Secondary | ICD-10-CM | POA: Diagnosis not present

## 2015-12-10 DIAGNOSIS — J45909 Unspecified asthma, uncomplicated: Secondary | ICD-10-CM | POA: Diagnosis not present

## 2015-12-10 DIAGNOSIS — R262 Difficulty in walking, not elsewhere classified: Secondary | ICD-10-CM | POA: Diagnosis not present

## 2015-12-10 DIAGNOSIS — E119 Type 2 diabetes mellitus without complications: Secondary | ICD-10-CM | POA: Diagnosis not present

## 2015-12-10 DIAGNOSIS — I2510 Atherosclerotic heart disease of native coronary artery without angina pectoris: Secondary | ICD-10-CM | POA: Diagnosis not present

## 2015-12-10 DIAGNOSIS — M6281 Muscle weakness (generalized): Secondary | ICD-10-CM | POA: Diagnosis not present

## 2015-12-10 DIAGNOSIS — C44319 Basal cell carcinoma of skin of other parts of face: Secondary | ICD-10-CM | POA: Diagnosis not present

## 2015-12-10 DIAGNOSIS — G464 Cerebellar stroke syndrome: Secondary | ICD-10-CM | POA: Diagnosis not present

## 2015-12-10 DIAGNOSIS — I252 Old myocardial infarction: Secondary | ICD-10-CM | POA: Diagnosis not present

## 2015-12-10 DIAGNOSIS — S42402D Unspecified fracture of lower end of left humerus, subsequent encounter for fracture with routine healing: Secondary | ICD-10-CM | POA: Diagnosis not present

## 2015-12-13 DIAGNOSIS — I2510 Atherosclerotic heart disease of native coronary artery without angina pectoris: Secondary | ICD-10-CM | POA: Diagnosis not present

## 2015-12-13 DIAGNOSIS — G894 Chronic pain syndrome: Secondary | ICD-10-CM | POA: Diagnosis not present

## 2015-12-13 DIAGNOSIS — S42402D Unspecified fracture of lower end of left humerus, subsequent encounter for fracture with routine healing: Secondary | ICD-10-CM | POA: Diagnosis not present

## 2015-12-13 DIAGNOSIS — I252 Old myocardial infarction: Secondary | ICD-10-CM | POA: Diagnosis not present

## 2015-12-13 DIAGNOSIS — Z48812 Encounter for surgical aftercare following surgery on the circulatory system: Secondary | ICD-10-CM | POA: Diagnosis not present

## 2015-12-13 DIAGNOSIS — I10 Essential (primary) hypertension: Secondary | ICD-10-CM | POA: Diagnosis not present

## 2015-12-13 DIAGNOSIS — F329 Major depressive disorder, single episode, unspecified: Secondary | ICD-10-CM | POA: Diagnosis not present

## 2015-12-13 DIAGNOSIS — E119 Type 2 diabetes mellitus without complications: Secondary | ICD-10-CM | POA: Diagnosis not present

## 2015-12-13 DIAGNOSIS — J45909 Unspecified asthma, uncomplicated: Secondary | ICD-10-CM | POA: Diagnosis not present

## 2015-12-14 ENCOUNTER — Other Ambulatory Visit: Payer: Self-pay | Admitting: *Deleted

## 2015-12-14 DIAGNOSIS — J45909 Unspecified asthma, uncomplicated: Secondary | ICD-10-CM | POA: Diagnosis not present

## 2015-12-14 DIAGNOSIS — I10 Essential (primary) hypertension: Secondary | ICD-10-CM | POA: Diagnosis not present

## 2015-12-14 DIAGNOSIS — F3341 Major depressive disorder, recurrent, in partial remission: Secondary | ICD-10-CM | POA: Diagnosis not present

## 2015-12-14 DIAGNOSIS — I499 Cardiac arrhythmia, unspecified: Secondary | ICD-10-CM | POA: Diagnosis not present

## 2015-12-14 DIAGNOSIS — N390 Urinary tract infection, site not specified: Secondary | ICD-10-CM | POA: Diagnosis not present

## 2015-12-14 DIAGNOSIS — R6889 Other general symptoms and signs: Secondary | ICD-10-CM | POA: Diagnosis not present

## 2015-12-14 DIAGNOSIS — E1151 Type 2 diabetes mellitus with diabetic peripheral angiopathy without gangrene: Secondary | ICD-10-CM | POA: Diagnosis not present

## 2015-12-14 NOTE — Patient Outreach (Signed)
[facility]) Care Management  [facility] Social Work  12/14/2015  Victor Wise 02-05-1951 [id number]  Subjective:  Patient transported to medical appointment by [facility].  Patient continues to reside temporarily at the [facility] until he is approved for his apartment.  Home Health agency[facility]  called on Saturday.  Nurse completed assessment on 7/301/7.  Objective:   Encounter Medications:  Outpatient Encounter Prescriptions as of 12/14/2015  Medication Sig  . acetaminophen (TYLENOL) 325 MG tablet Take 2 tablets (650 mg total) by mouth every 6 (six) hours as needed for mild pain. (Patient not taking: Reported on 12/07/2015)  . amLODipine (NORVASC) 10 MG tablet Take 10 mg by mouth daily.  [name] aspirin EC 81 MG tablet Take 81 mg by mouth daily.  . benazepril (LOTENSIN) 40 MG tablet Take 40 mg by mouth 2 (two) times daily.   [name] buPROPion (WELLBUTRIN XL) 150 MG 24 hr tablet Take 300 mg by mouth daily.   . busPIRone (BUSPAR) 10 MG tablet Take 10 mg by mouth at bedtime.  . cetirizine (ZYRTEC) 10 MG tablet Take 10 mg by mouth at bedtime as needed for allergies.   . clarithromycin (BIAXIN) 500 MG tablet Take 1 tablet (500 mg total) by mouth 2 (two) times daily. (Patient not taking: Reported on 12/07/2015)  . clopidogrel (PLAVIX) 75 MG tablet Take 1 tablet (75 mg total) by mouth daily.  . DULoxetine (CYMBALTA) 30 MG capsule Take 30-60 mg by mouth 2 (two) times daily. Pt takes two capsules in the morning and one at night.  . Exenatide ER (BYDUREON) 2 MG PEN Inject 2 mg into the skin once a week. Pt uses on Sunday.  . fenofibrate 160 MG tablet Take 1 tablet (160 mg total) by mouth every evening.  . fluticasone (FLONASE) 50 MCG/ACT nasal spray Place 2 sprays into both nostrils daily.  . hydrochlorothiazide (MICROZIDE) 12.5 MG capsule Take 12.5 mg by mouth daily.  . insulin aspart (NOVOLOG) 100 UNIT/ML injection Inject 5 Units into the skin 3 (three) times daily with meals.  .  insulin glargine (LANTUS) 100 UNIT/ML injection Inject 0.4 mLs (40 Units total) into the skin at bedtime.  . metaxalone (SKELAXIN) 800 MG tablet Take 400-800 mg by mouth 3 (three) times daily as needed for muscle spasms.  [name] NARCAN 4 MG/0.1ML LIQD Take 4 mg by mouth as needed (for opioid overdose).   . nitroGLYCERIN (NITROSTAT) 0.4 MG SL tablet Place 1 tablet (0.4 mg total) under the tongue every 5 (five) minutes x 3 doses as needed for chest pain.  [name] omega-3 acid ethyl esters (LOVAZA) 1 g capsule Take 1 capsule (1 g total) by mouth 2 (two) times daily.  . Oxcarbazepine (TRILEPTAL) 300 MG tablet Take 300-600 mg by mouth 2 (two) times daily. Pt takes one tablet in the morning and two at night.  [name] oxyCODONE (OXY IR/ROXICODONE) 5 MG immediate release tablet Take 1 tablet (5 mg total) by mouth 3 (three) times daily as needed for severe pain.  [name] oxyCODONE (OXYCONTIN) 20 mg 12 hr tablet Take 1 tablet (20 mg total) by mouth every 12 (twelve) hours.  . pantoprazole (PROTONIX) 40 MG tablet Take 40 mg by mouth daily.  . pregabalin (LYRICA) 200 MG capsule Take 200-400 mg by mouth 2 (two) times daily. Pt takes two capsules in the morning and one at night.  . tamsulosin (FLOMAX) 0.4 MG CAPS capsule Take 0.4 mg by mouth daily after supper.    No facility-administered encounter medications on  file as of 12/14/2015.     Functional Status:  In your present state of health, do you have any difficulty performing the following activities: 12/08/2015 10/26/2015  Hearing? [place]? N -  Difficulty concentrating or making decisions? Y -  Walking or climbing stairs? Y -  Dressing or bathing? N -  Doing errands, shopping? Y N  Preparing Food and eating ? Y -  Using the Toilet? N -  In the past six months, have you accidently leaked urine? Y -  Do you have problems with loss of bowel control? N -  Managing your Medications? N -  Managing your Finances? N -  Housekeeping or managing your Housekeeping? Y -  Some  recent data might be hidden    Fall/Depression Screening:  PHQ 2/9 Scores 10/08/2015 09/08/2015 08/12/2015 07/27/2015 07/22/2015 04/03/2015 01/20/2015  PHQ - 2 Score 3 6 6  - 4 6 6   PHQ- 9 Score 19 16 24  - 19 19 24   Exception Documentation - - - Patient refusal - - -    Assessment: Patient weight 288.5  A1C 8.6.   Per patient he was not checking his sugars because current lancets did not go deep enough.  MD given patient new ones to try.  Patient reported cloudy urine and pain when he urinates.  Could not provide a urine sample in MD office. Patient given doxycycline to be picked up today [facility].  Patient to return for a follow up appointment in 1 month.  [employer] transportation contacted to transport patient home.    RNCM to be notified of appointment with MD  Plan: This Education officer, museum will follow up with patient on the status of his apartment in 1 month

## 2015-12-16 DIAGNOSIS — S42402D Unspecified fracture of lower end of left humerus, subsequent encounter for fracture with routine healing: Secondary | ICD-10-CM | POA: Diagnosis not present

## 2015-12-17 ENCOUNTER — Other Ambulatory Visit: Payer: Self-pay | Admitting: *Deleted

## 2015-12-17 DIAGNOSIS — J45909 Unspecified asthma, uncomplicated: Secondary | ICD-10-CM | POA: Diagnosis not present

## 2015-12-17 DIAGNOSIS — I10 Essential (primary) hypertension: Secondary | ICD-10-CM | POA: Diagnosis not present

## 2015-12-17 DIAGNOSIS — S42402D Unspecified fracture of lower end of left humerus, subsequent encounter for fracture with routine healing: Secondary | ICD-10-CM | POA: Diagnosis not present

## 2015-12-17 DIAGNOSIS — E119 Type 2 diabetes mellitus without complications: Secondary | ICD-10-CM | POA: Diagnosis not present

## 2015-12-17 DIAGNOSIS — I2510 Atherosclerotic heart disease of native coronary artery without angina pectoris: Secondary | ICD-10-CM | POA: Diagnosis not present

## 2015-12-17 DIAGNOSIS — I252 Old myocardial infarction: Secondary | ICD-10-CM | POA: Diagnosis not present

## 2015-12-17 DIAGNOSIS — Z48812 Encounter for surgical aftercare following surgery on the circulatory system: Secondary | ICD-10-CM | POA: Diagnosis not present

## 2015-12-17 DIAGNOSIS — F329 Major depressive disorder, single episode, unspecified: Secondary | ICD-10-CM | POA: Diagnosis not present

## 2015-12-17 DIAGNOSIS — G894 Chronic pain syndrome: Secondary | ICD-10-CM | POA: Diagnosis not present

## 2015-12-17 NOTE — Patient Outreach (Signed)
[facility] [facility]) Care Management  12/17/2015  Victor Wise 1950/08/22 [id number]   Transition of care call  RNCM placed call to patient as part of the transition of care program, HIPAA verified. Victor Wise reports that he is doing pretty good this morning but discussed yesterday that he did not do well. Patient reports that he transportation service sent him a text message on 8/1 that they where cancelling his transportation to appointment on 8/2 orthopedic MD visit. Victor Wise reports he had to drive to office visit, he made it there safely and back, without driving incident , reports he had to walk a distance due to no handicap spot to park and a slip but did not fall, patient using his cane.  Patient report cast has been removed from left elbow area and he only is only wearing a sleeve, no further orthopedic followed needed per patient report.   Victor Wise discussed that he has to go to the bank this afternoon to obtain further information to provide for apartment . Cautioned Victor Wise regarding driving himself, due to increased risk of fall. Patient states his friend will provide transportation,discussed with patient to notify RNCM, LCSW with transportation to MD appointments, also discussed other transportation means.  Victor Wise reports he has been checking his blood sugar, keeping a record and taking medication as prescribed. Blood sugar this am is 138, and have been running less than 140,denies having any episodes of hypoglycemia.  Patient reports antibiotic for UTI, as directed, reports no increase in urinating difficulty, odor or burning, states urine still cloudy but clearing. Encouraged to notify MD of worsening symptoms.  Victor Wise denies increased swelling, shortness of breath, he has not been able to get his scales to weigh on but , when his daughter return home in 1 to 2 days he will obtain his scales from his sister's home, he declines assistance with providing scales, "I bought those  scale and I will get them to use". Patient reports that he is trying to keep a eye on the foods he has able to eat to not over do it with the salt.  Patient denies any new concerns at this time. Reinforced fall prevention measures.   Plan Patient will remain active with transition of care program, weekly phone call scheduled in a week. Patient will notify MD of worsening signs/symptoms of heart failure, urinary tract infection . Patient will notify RNCM, LCSW regarding transportation concerns to MD appointments.  THN CM Care Plan Problem One   Flowsheet Row Most Recent Value  Care Plan Problem One  High Risk for readmission to hospital related as evidenced by recent discharge from Hospital/Rehab   Role Documenting the Problem One  Care Management [place] for Problem One  Active  THN Long Term Goal (31-90 days)  Patient will report no hospital admission in the next 31 days  THN Long Term Goal Start Date  12/07/15  Interventions for Problem One Long Term Goal  Reinforced importance of notifying MD of concerns related to worsening , Heart failure, urinary tract infection,, completing antiibiotic completely, and notify RNCM with concerns, verified patient has contact information.   THN CM Short Term Goal #1 (0-30 days)  Patient will report obtaining meter and begin checking his blood sugar daily and recording in the next 7 days   THN CM Short Term Goal #1 Start Date  12/07/15  [facility] CM Short Term Goal #1 Met Date  12/17/15  THN CM Short Term Goal #2 (0-30 days)  Patient will begin to participate in home health therapy in the next 7 days.   THN CM Short Term Goal #2 Start Date  12/07/15  THN CM Short Term Goal #2 Met Date  12/17/15  THN CM Short Term Goal #3 (0-30 days)  Patient will obtain scales and begin to weigh daily and record in the next 30 days   THN CM Short Term Goal #3 Start Date  12/08/15  Interventions for Short Tern Goal #3  Encouraged patient regarding obtain  scales,reinforced importance of weighing daily,  will ask daughter to bring scale to him when she returns home,   [facility] CM Short Term Goal #4 (0-30 days)  Patient will be able to verbalize at least 3 symptoms of hypoglycemia and action plan to take in the next 30 days   THN CM Short Term Goal #4 Start Date  12/17/15  Interventions for Short Term Goal #4  Reviewed symptoms of hypoglycemia and rule of 15 plan for treating episode.   THN CM Short Term Goal #5 (0-30 days)  Patient will report using his cane at all times to prevent falls in the next 30 days   THN CM Short Term Goal #5 Start Date  12/17/15  Interventions for Short Term Goal #5  REviewed fall prevention measures of importance of using his cane to help balance, keeping a light on a night, keeping his cell phone in his pocket at all times to easy access, and participation in therapy exercises.      Joylene Draft, RN, [place] Management [phone]- Mobile [phone]- Toll Free Main Office

## 2015-12-18 DIAGNOSIS — S42402D Unspecified fracture of lower end of left humerus, subsequent encounter for fracture with routine healing: Secondary | ICD-10-CM | POA: Diagnosis not present

## 2015-12-18 DIAGNOSIS — J45909 Unspecified asthma, uncomplicated: Secondary | ICD-10-CM | POA: Diagnosis not present

## 2015-12-18 DIAGNOSIS — I2510 Atherosclerotic heart disease of native coronary artery without angina pectoris: Secondary | ICD-10-CM | POA: Diagnosis not present

## 2015-12-18 DIAGNOSIS — E119 Type 2 diabetes mellitus without complications: Secondary | ICD-10-CM | POA: Diagnosis not present

## 2015-12-18 DIAGNOSIS — I10 Essential (primary) hypertension: Secondary | ICD-10-CM | POA: Diagnosis not present

## 2015-12-18 DIAGNOSIS — Z48812 Encounter for surgical aftercare following surgery on the circulatory system: Secondary | ICD-10-CM | POA: Diagnosis not present

## 2015-12-18 DIAGNOSIS — I252 Old myocardial infarction: Secondary | ICD-10-CM | POA: Diagnosis not present

## 2015-12-18 DIAGNOSIS — G894 Chronic pain syndrome: Secondary | ICD-10-CM | POA: Diagnosis not present

## 2015-12-18 DIAGNOSIS — F329 Major depressive disorder, single episode, unspecified: Secondary | ICD-10-CM | POA: Diagnosis not present

## 2015-12-21 ENCOUNTER — Other Ambulatory Visit: Payer: Self-pay | Admitting: *Deleted

## 2015-12-21 DIAGNOSIS — I10 Essential (primary) hypertension: Secondary | ICD-10-CM | POA: Diagnosis not present

## 2015-12-21 DIAGNOSIS — Z48812 Encounter for surgical aftercare following surgery on the circulatory system: Secondary | ICD-10-CM | POA: Diagnosis not present

## 2015-12-21 DIAGNOSIS — F329 Major depressive disorder, single episode, unspecified: Secondary | ICD-10-CM | POA: Diagnosis not present

## 2015-12-21 DIAGNOSIS — J45909 Unspecified asthma, uncomplicated: Secondary | ICD-10-CM | POA: Diagnosis not present

## 2015-12-21 DIAGNOSIS — S42402D Unspecified fracture of lower end of left humerus, subsequent encounter for fracture with routine healing: Secondary | ICD-10-CM | POA: Diagnosis not present

## 2015-12-21 DIAGNOSIS — G894 Chronic pain syndrome: Secondary | ICD-10-CM | POA: Diagnosis not present

## 2015-12-21 DIAGNOSIS — E119 Type 2 diabetes mellitus without complications: Secondary | ICD-10-CM | POA: Diagnosis not present

## 2015-12-21 DIAGNOSIS — I252 Old myocardial infarction: Secondary | ICD-10-CM | POA: Diagnosis not present

## 2015-12-21 DIAGNOSIS — I2510 Atherosclerotic heart disease of native coronary artery without angina pectoris: Secondary | ICD-10-CM | POA: Diagnosis not present

## 2015-12-21 NOTE — Patient Outreach (Signed)
[place] [facility]) Care Management  12/21/2015  IRFAN KOPPLIN 04/10/1951 [id number]   Phone call to [place] [employer] to follow up on ride, that per patient "was cancelled".  Per [employer], the ride was arranged through [place]. [employer] does not call the patient or knock on patient's door.  This social worker explained, that patient needs more time, assistance and a reminder call.  It was recommended that Lyft not be the mode of transportation due to his medical condition. Patient will need to utilize more of a medical transport.    [name] [facility] Care Management [phone]

## 2015-12-23 DIAGNOSIS — G894 Chronic pain syndrome: Secondary | ICD-10-CM | POA: Diagnosis not present

## 2015-12-23 DIAGNOSIS — E119 Type 2 diabetes mellitus without complications: Secondary | ICD-10-CM | POA: Diagnosis not present

## 2015-12-23 DIAGNOSIS — J45909 Unspecified asthma, uncomplicated: Secondary | ICD-10-CM | POA: Diagnosis not present

## 2015-12-23 DIAGNOSIS — S42402D Unspecified fracture of lower end of left humerus, subsequent encounter for fracture with routine healing: Secondary | ICD-10-CM | POA: Diagnosis not present

## 2015-12-23 DIAGNOSIS — I10 Essential (primary) hypertension: Secondary | ICD-10-CM | POA: Diagnosis not present

## 2015-12-23 DIAGNOSIS — I252 Old myocardial infarction: Secondary | ICD-10-CM | POA: Diagnosis not present

## 2015-12-23 DIAGNOSIS — I2510 Atherosclerotic heart disease of native coronary artery without angina pectoris: Secondary | ICD-10-CM | POA: Diagnosis not present

## 2015-12-23 DIAGNOSIS — Z48812 Encounter for surgical aftercare following surgery on the circulatory system: Secondary | ICD-10-CM | POA: Diagnosis not present

## 2015-12-23 DIAGNOSIS — F329 Major depressive disorder, single episode, unspecified: Secondary | ICD-10-CM | POA: Diagnosis not present

## 2015-12-24 ENCOUNTER — Other Ambulatory Visit: Payer: Self-pay | Admitting: *Deleted

## 2015-12-24 DIAGNOSIS — G4733 Obstructive sleep apnea (adult) (pediatric): Secondary | ICD-10-CM | POA: Diagnosis not present

## 2015-12-24 NOTE — Patient Outreach (Signed)
[facility] [facility]) Care Management  12/24/2015  Victor Wise Mar 27, 1951 [id number]   Transition of care call  RNCM placed call to patient, HIPAA verified. [name] reports feeling better on today, he discussed that he had a slip from the bed last night states he had hypoglycemic symptoms of feeling shaky, weak and when trying to get up from the bed  he slid out from bed, and crawled over to get a snack to eat and then he felt better. Patient states he did not check his blood sugar last night during or safety episode but blood sugar was 184 on this am. [name] discussed that it checked his blood sugar last night , then took his novolog sliding scale   insulin and there was a delay in eating dinner last night. He also discussed that current syringes came  from [facility] and he normally gets his from [employer], and they are harder to see. Patient plans to ask daughter to get syringes he is used to using  from pharmacy and  Pick up his refill for fish oil.  Encouraged patient to make contact if he has delay in getting syringes, reminded him that [employer] will deliver.  Discussed with patient importance of notifying MD of increased episodes of hypoglycemia, reviewed plan for treating hypoglycemia.   [name] states he has been making good progress with physical therapy, they are seeing him Monday,Wednesday and Friday.  [name] still does not have his scale yet from his sister's home, but will ask his daughter he has declined assistance for getting scales through [facility], Reinforced the importance of weighing, he denies any increase in swelling and no shortness of breath. Patient discussed food that he is eating lean cuisine meals, subs, can spaghetti, and had pizza one night, reinforced education on limiting salt as he can in his current situation. Discussed with patient regarding [employer] frozen meals, he is unsure his daughter will be able to store extra , but will ask her and let me know. [name]  discussed he has not heard anything about the apartment yet, he has turned in the most recent request for information.  Patient states he receives a weekly rate for stay at [facility], but the cost of one week is almost as much as a month at his sister's home, he discussed selling his old truck and trailor  to help.  Plan Patient will remain active with weekly transition of care call , next call within a week. Patient will contact MD for increased episodes of hypoglycemia.  Patient encouraged to notify MD sooner on symptoms of heart failure.in yellow zone.     Joylene Draft, RN, [place] Management [phone]- Mobile [phone]- Toll Free Main Office

## 2015-12-25 ENCOUNTER — Other Ambulatory Visit: Payer: Self-pay | Admitting: *Deleted

## 2015-12-25 DIAGNOSIS — G894 Chronic pain syndrome: Secondary | ICD-10-CM | POA: Diagnosis not present

## 2015-12-25 DIAGNOSIS — S42402D Unspecified fracture of lower end of left humerus, subsequent encounter for fracture with routine healing: Secondary | ICD-10-CM | POA: Diagnosis not present

## 2015-12-25 DIAGNOSIS — F329 Major depressive disorder, single episode, unspecified: Secondary | ICD-10-CM | POA: Diagnosis not present

## 2015-12-25 DIAGNOSIS — E119 Type 2 diabetes mellitus without complications: Secondary | ICD-10-CM | POA: Diagnosis not present

## 2015-12-25 DIAGNOSIS — Z48812 Encounter for surgical aftercare following surgery on the circulatory system: Secondary | ICD-10-CM | POA: Diagnosis not present

## 2015-12-25 DIAGNOSIS — I252 Old myocardial infarction: Secondary | ICD-10-CM | POA: Diagnosis not present

## 2015-12-25 DIAGNOSIS — J45909 Unspecified asthma, uncomplicated: Secondary | ICD-10-CM | POA: Diagnosis not present

## 2015-12-25 DIAGNOSIS — I2510 Atherosclerotic heart disease of native coronary artery without angina pectoris: Secondary | ICD-10-CM | POA: Diagnosis not present

## 2015-12-25 DIAGNOSIS — I10 Essential (primary) hypertension: Secondary | ICD-10-CM | POA: Diagnosis not present

## 2015-12-25 NOTE — Patient Outreach (Addendum)
[facility]) Care Management  12/25/2015  Victor Wise Nov 10, 1950 [id number]   Phone call from patient's daughter, patient's name and birth date verified,  who reports having no status update regarding his apartment status.  She is however willing to have the well-dine meals mailed to her home to give to patient. [place], [place] [address]   Plan: Well-dine meals to be ordered to be sent to daughter's home and provided to patient. Meals to be delivered on Tuesday, 12/29/15 by UPS or Fedex order [id number]   [place], [place] Management [phone]

## 2015-12-25 NOTE — Patient Outreach (Signed)
[place] [facility]) Care Management  12/25/2015  Victor Wise 05-Jul-1950 [id number]   Phone call to patient to discuss status of apartment and to explore well-dine meals being sent to his daughter for her to bring 2 at a time as his freezer will not fit all 10 meals.  Patient agrees with this idea. Per patient, he has no update on the status of his apartment.  Patient's daughter calling today to check status.  Phone call to patient's daughter [name] to follow up apartment status and well-dine request. HIPPA compliant voicemail message left requesting a return call.   [name] [facility] Care Management [phone]

## 2015-12-28 DIAGNOSIS — Z48812 Encounter for surgical aftercare following surgery on the circulatory system: Secondary | ICD-10-CM | POA: Diagnosis not present

## 2015-12-28 DIAGNOSIS — I10 Essential (primary) hypertension: Secondary | ICD-10-CM | POA: Diagnosis not present

## 2015-12-28 DIAGNOSIS — I2510 Atherosclerotic heart disease of native coronary artery without angina pectoris: Secondary | ICD-10-CM | POA: Diagnosis not present

## 2015-12-28 DIAGNOSIS — J45909 Unspecified asthma, uncomplicated: Secondary | ICD-10-CM | POA: Diagnosis not present

## 2015-12-28 DIAGNOSIS — I252 Old myocardial infarction: Secondary | ICD-10-CM | POA: Diagnosis not present

## 2015-12-28 DIAGNOSIS — E119 Type 2 diabetes mellitus without complications: Secondary | ICD-10-CM | POA: Diagnosis not present

## 2015-12-28 DIAGNOSIS — G894 Chronic pain syndrome: Secondary | ICD-10-CM | POA: Diagnosis not present

## 2015-12-28 DIAGNOSIS — S42402D Unspecified fracture of lower end of left humerus, subsequent encounter for fracture with routine healing: Secondary | ICD-10-CM | POA: Diagnosis not present

## 2015-12-28 DIAGNOSIS — F329 Major depressive disorder, single episode, unspecified: Secondary | ICD-10-CM | POA: Diagnosis not present

## 2015-12-29 DIAGNOSIS — G894 Chronic pain syndrome: Secondary | ICD-10-CM | POA: Diagnosis not present

## 2015-12-29 DIAGNOSIS — I10 Essential (primary) hypertension: Secondary | ICD-10-CM | POA: Diagnosis not present

## 2015-12-29 DIAGNOSIS — S42402D Unspecified fracture of lower end of left humerus, subsequent encounter for fracture with routine healing: Secondary | ICD-10-CM | POA: Diagnosis not present

## 2015-12-29 DIAGNOSIS — J45909 Unspecified asthma, uncomplicated: Secondary | ICD-10-CM | POA: Diagnosis not present

## 2015-12-29 DIAGNOSIS — F329 Major depressive disorder, single episode, unspecified: Secondary | ICD-10-CM | POA: Diagnosis not present

## 2015-12-29 DIAGNOSIS — I252 Old myocardial infarction: Secondary | ICD-10-CM | POA: Diagnosis not present

## 2015-12-29 DIAGNOSIS — Z48812 Encounter for surgical aftercare following surgery on the circulatory system: Secondary | ICD-10-CM | POA: Diagnosis not present

## 2015-12-29 DIAGNOSIS — E119 Type 2 diabetes mellitus without complications: Secondary | ICD-10-CM | POA: Diagnosis not present

## 2015-12-29 DIAGNOSIS — I2510 Atherosclerotic heart disease of native coronary artery without angina pectoris: Secondary | ICD-10-CM | POA: Diagnosis not present

## 2015-12-30 DIAGNOSIS — I252 Old myocardial infarction: Secondary | ICD-10-CM | POA: Diagnosis not present

## 2015-12-30 DIAGNOSIS — E119 Type 2 diabetes mellitus without complications: Secondary | ICD-10-CM | POA: Diagnosis not present

## 2015-12-30 DIAGNOSIS — I10 Essential (primary) hypertension: Secondary | ICD-10-CM | POA: Diagnosis not present

## 2015-12-30 DIAGNOSIS — J45909 Unspecified asthma, uncomplicated: Secondary | ICD-10-CM | POA: Diagnosis not present

## 2015-12-30 DIAGNOSIS — F329 Major depressive disorder, single episode, unspecified: Secondary | ICD-10-CM | POA: Diagnosis not present

## 2015-12-30 DIAGNOSIS — Z48812 Encounter for surgical aftercare following surgery on the circulatory system: Secondary | ICD-10-CM | POA: Diagnosis not present

## 2015-12-30 DIAGNOSIS — S42402D Unspecified fracture of lower end of left humerus, subsequent encounter for fracture with routine healing: Secondary | ICD-10-CM | POA: Diagnosis not present

## 2015-12-30 DIAGNOSIS — I2510 Atherosclerotic heart disease of native coronary artery without angina pectoris: Secondary | ICD-10-CM | POA: Diagnosis not present

## 2015-12-30 DIAGNOSIS — G894 Chronic pain syndrome: Secondary | ICD-10-CM | POA: Diagnosis not present

## 2015-12-31 ENCOUNTER — Other Ambulatory Visit: Payer: Self-pay | Admitting: *Deleted

## 2015-12-31 DIAGNOSIS — E119 Type 2 diabetes mellitus without complications: Secondary | ICD-10-CM | POA: Diagnosis not present

## 2015-12-31 DIAGNOSIS — I252 Old myocardial infarction: Secondary | ICD-10-CM | POA: Diagnosis not present

## 2015-12-31 DIAGNOSIS — I2510 Atherosclerotic heart disease of native coronary artery without angina pectoris: Secondary | ICD-10-CM | POA: Diagnosis not present

## 2015-12-31 DIAGNOSIS — F329 Major depressive disorder, single episode, unspecified: Secondary | ICD-10-CM | POA: Diagnosis not present

## 2015-12-31 DIAGNOSIS — I10 Essential (primary) hypertension: Secondary | ICD-10-CM | POA: Diagnosis not present

## 2015-12-31 DIAGNOSIS — G894 Chronic pain syndrome: Secondary | ICD-10-CM | POA: Diagnosis not present

## 2015-12-31 DIAGNOSIS — S42402D Unspecified fracture of lower end of left humerus, subsequent encounter for fracture with routine healing: Secondary | ICD-10-CM | POA: Diagnosis not present

## 2015-12-31 DIAGNOSIS — J45909 Unspecified asthma, uncomplicated: Secondary | ICD-10-CM | POA: Diagnosis not present

## 2015-12-31 DIAGNOSIS — Z48812 Encounter for surgical aftercare following surgery on the circulatory system: Secondary | ICD-10-CM | POA: Diagnosis not present

## 2015-12-31 NOTE — Patient Outreach (Signed)
[facility]) Care Management  12/31/2015  Victor Wise 06-30-1950 [id number]   Transition of care call  Spoke with Mr.Foxworth, HIPAA questions verified. Patient states he is progressing well,  just completed a physical therapy session and states he doesn't think he has many more sessions left. Patient discussed that he had a fall on Tuesday, bending down to reach something from the back of his refrigerator and lost his balance. He states occupational therapist to bring him a Secondary school teacher on tomorrow.  Mr.Slavick reports this blood sugar range has been good in the 150's he denies have any other episodes of hypoglycemia, he has appropriate insulin syringes .  Patient reports that he has received [employer] meals he was able to fit 2 meals in the freezer, states it has been going pretty good with eating, he has a salad and has [place] and bread. His daughter [name] is to bring his digital scale to him on today. Patient denies increased shortness of breath or swelling, reinforced to notify MD of increased symptoms.  Patient states he placed a call to apartment on today and office was closed, he states his daughter [name] will check on it tomorrow, encouraged patient to reach out to office also.  Patient will benefit from continued community care management follow up after transition of care period , due to being  high risk for readmission.   Plan Will place final transition of care call on next week, and plan next home visit within 2 weeks.  Joylene Draft, RN, [place] Management [phone]- Mobile [phone]- Toll Free Main Office

## 2016-01-01 DIAGNOSIS — I10 Essential (primary) hypertension: Secondary | ICD-10-CM | POA: Diagnosis not present

## 2016-01-01 DIAGNOSIS — G894 Chronic pain syndrome: Secondary | ICD-10-CM | POA: Diagnosis not present

## 2016-01-01 DIAGNOSIS — S42402D Unspecified fracture of lower end of left humerus, subsequent encounter for fracture with routine healing: Secondary | ICD-10-CM | POA: Diagnosis not present

## 2016-01-01 DIAGNOSIS — Z48812 Encounter for surgical aftercare following surgery on the circulatory system: Secondary | ICD-10-CM | POA: Diagnosis not present

## 2016-01-01 DIAGNOSIS — J45909 Unspecified asthma, uncomplicated: Secondary | ICD-10-CM | POA: Diagnosis not present

## 2016-01-01 DIAGNOSIS — F329 Major depressive disorder, single episode, unspecified: Secondary | ICD-10-CM | POA: Diagnosis not present

## 2016-01-01 DIAGNOSIS — I2510 Atherosclerotic heart disease of native coronary artery without angina pectoris: Secondary | ICD-10-CM | POA: Diagnosis not present

## 2016-01-01 DIAGNOSIS — E119 Type 2 diabetes mellitus without complications: Secondary | ICD-10-CM | POA: Diagnosis not present

## 2016-01-01 DIAGNOSIS — I252 Old myocardial infarction: Secondary | ICD-10-CM | POA: Diagnosis not present

## 2016-01-05 DIAGNOSIS — I2510 Atherosclerotic heart disease of native coronary artery without angina pectoris: Secondary | ICD-10-CM | POA: Diagnosis not present

## 2016-01-05 DIAGNOSIS — I10 Essential (primary) hypertension: Secondary | ICD-10-CM | POA: Diagnosis not present

## 2016-01-05 DIAGNOSIS — S42402D Unspecified fracture of lower end of left humerus, subsequent encounter for fracture with routine healing: Secondary | ICD-10-CM | POA: Diagnosis not present

## 2016-01-05 DIAGNOSIS — Z48812 Encounter for surgical aftercare following surgery on the circulatory system: Secondary | ICD-10-CM | POA: Diagnosis not present

## 2016-01-05 DIAGNOSIS — F329 Major depressive disorder, single episode, unspecified: Secondary | ICD-10-CM | POA: Diagnosis not present

## 2016-01-05 DIAGNOSIS — G894 Chronic pain syndrome: Secondary | ICD-10-CM | POA: Diagnosis not present

## 2016-01-05 DIAGNOSIS — I252 Old myocardial infarction: Secondary | ICD-10-CM | POA: Diagnosis not present

## 2016-01-05 DIAGNOSIS — E119 Type 2 diabetes mellitus without complications: Secondary | ICD-10-CM | POA: Diagnosis not present

## 2016-01-05 DIAGNOSIS — J45909 Unspecified asthma, uncomplicated: Secondary | ICD-10-CM | POA: Diagnosis not present

## 2016-01-06 DIAGNOSIS — S42402D Unspecified fracture of lower end of left humerus, subsequent encounter for fracture with routine healing: Secondary | ICD-10-CM | POA: Diagnosis not present

## 2016-01-06 DIAGNOSIS — I10 Essential (primary) hypertension: Secondary | ICD-10-CM | POA: Diagnosis not present

## 2016-01-06 DIAGNOSIS — F329 Major depressive disorder, single episode, unspecified: Secondary | ICD-10-CM | POA: Diagnosis not present

## 2016-01-06 DIAGNOSIS — E119 Type 2 diabetes mellitus without complications: Secondary | ICD-10-CM | POA: Diagnosis not present

## 2016-01-06 DIAGNOSIS — J45909 Unspecified asthma, uncomplicated: Secondary | ICD-10-CM | POA: Diagnosis not present

## 2016-01-06 DIAGNOSIS — G894 Chronic pain syndrome: Secondary | ICD-10-CM | POA: Diagnosis not present

## 2016-01-06 DIAGNOSIS — I2510 Atherosclerotic heart disease of native coronary artery without angina pectoris: Secondary | ICD-10-CM | POA: Diagnosis not present

## 2016-01-06 DIAGNOSIS — Z48812 Encounter for surgical aftercare following surgery on the circulatory system: Secondary | ICD-10-CM | POA: Diagnosis not present

## 2016-01-06 DIAGNOSIS — I252 Old myocardial infarction: Secondary | ICD-10-CM | POA: Diagnosis not present

## 2016-01-07 ENCOUNTER — Other Ambulatory Visit: Payer: Self-pay | Admitting: *Deleted

## 2016-01-07 DIAGNOSIS — S42402D Unspecified fracture of lower end of left humerus, subsequent encounter for fracture with routine healing: Secondary | ICD-10-CM | POA: Diagnosis not present

## 2016-01-07 DIAGNOSIS — Z48812 Encounter for surgical aftercare following surgery on the circulatory system: Secondary | ICD-10-CM | POA: Diagnosis not present

## 2016-01-07 DIAGNOSIS — I10 Essential (primary) hypertension: Secondary | ICD-10-CM | POA: Diagnosis not present

## 2016-01-07 DIAGNOSIS — E119 Type 2 diabetes mellitus without complications: Secondary | ICD-10-CM | POA: Diagnosis not present

## 2016-01-07 DIAGNOSIS — J45909 Unspecified asthma, uncomplicated: Secondary | ICD-10-CM | POA: Diagnosis not present

## 2016-01-07 DIAGNOSIS — I2510 Atherosclerotic heart disease of native coronary artery without angina pectoris: Secondary | ICD-10-CM | POA: Diagnosis not present

## 2016-01-07 DIAGNOSIS — F329 Major depressive disorder, single episode, unspecified: Secondary | ICD-10-CM | POA: Diagnosis not present

## 2016-01-07 DIAGNOSIS — G894 Chronic pain syndrome: Secondary | ICD-10-CM | POA: Diagnosis not present

## 2016-01-07 DIAGNOSIS — I252 Old myocardial infarction: Secondary | ICD-10-CM | POA: Diagnosis not present

## 2016-01-07 NOTE — Patient Outreach (Signed)
[place] [facility]) Care Management  01/07/2016  HUNTLEE OVITT 08-17-1950 [id number]  Transition of care note  Spoke with Mr.Parekh states he is getting along pretty good. Patient discussed that he has turned in the last requested information to the apartment complex, office is closed today, encouraged him to call office in am.  Patient states his blood sugars having been doing great, this am reading 134, he has been eating salads and has 3 of the Well Dine meals in his refrigerator. Patient states he is motivated to continue to eat "right", watching salt and starches. Mr.People states he has received his scales, but has difficulty reading the number on the scales, due to his protruding belly, but it is getting better. Patient has also received his old blood sugar meter, to which he has a great supply of strips and will begin to use that , because he is out of strips for the new meter.  Mr.Nez reports that his CPAP tubing has a crack in it, I has been like that for a while, because the tubing was placed in a bag and got bent. Mr.Yuhasz plans to call [employer] on today to get new tubing delivered to his daughters home , so she can deliver it to him because his is unsure how much longer he will be at [facility] Stressed the importance of following up on tubing , offered to place call patient states he will do it encouraged call if he has concerns.   Mr.Georgiou has completed transition of care period, no hospital readmission, patient will benefit from continued care management coordinator education and support, high for readmission , patient is agreeable to continued follow up.     Plan Will plan scheduled home visit in the next week. Patient to follow up on calling [employer] for new CPAP tubing and notify if he has concerns.   Joylene Draft, RN, [place] Management [phone]- Mobile [phone]- Toll Free Main Office

## 2016-01-10 DIAGNOSIS — M6281 Muscle weakness (generalized): Secondary | ICD-10-CM | POA: Diagnosis not present

## 2016-01-10 DIAGNOSIS — C44319 Basal cell carcinoma of skin of other parts of face: Secondary | ICD-10-CM | POA: Diagnosis not present

## 2016-01-10 DIAGNOSIS — R262 Difficulty in walking, not elsewhere classified: Secondary | ICD-10-CM | POA: Diagnosis not present

## 2016-01-10 DIAGNOSIS — G464 Cerebellar stroke syndrome: Secondary | ICD-10-CM | POA: Diagnosis not present

## 2016-01-12 DIAGNOSIS — I252 Old myocardial infarction: Secondary | ICD-10-CM | POA: Diagnosis not present

## 2016-01-12 DIAGNOSIS — I10 Essential (primary) hypertension: Secondary | ICD-10-CM | POA: Diagnosis not present

## 2016-01-12 DIAGNOSIS — I2510 Atherosclerotic heart disease of native coronary artery without angina pectoris: Secondary | ICD-10-CM | POA: Diagnosis not present

## 2016-01-12 DIAGNOSIS — G894 Chronic pain syndrome: Secondary | ICD-10-CM | POA: Diagnosis not present

## 2016-01-12 DIAGNOSIS — J45909 Unspecified asthma, uncomplicated: Secondary | ICD-10-CM | POA: Diagnosis not present

## 2016-01-12 DIAGNOSIS — Z48812 Encounter for surgical aftercare following surgery on the circulatory system: Secondary | ICD-10-CM | POA: Diagnosis not present

## 2016-01-12 DIAGNOSIS — E119 Type 2 diabetes mellitus without complications: Secondary | ICD-10-CM | POA: Diagnosis not present

## 2016-01-12 DIAGNOSIS — S42402D Unspecified fracture of lower end of left humerus, subsequent encounter for fracture with routine healing: Secondary | ICD-10-CM | POA: Diagnosis not present

## 2016-01-12 DIAGNOSIS — F329 Major depressive disorder, single episode, unspecified: Secondary | ICD-10-CM | POA: Diagnosis not present

## 2016-01-14 ENCOUNTER — Other Ambulatory Visit: Payer: Self-pay | Admitting: *Deleted

## 2016-01-14 ENCOUNTER — Encounter: Payer: Self-pay | Admitting: *Deleted

## 2016-01-14 NOTE — Patient Outreach (Addendum)
[facility]) Care Management  [facility] Social Work  01/14/2016  Victor Wise 03-21-1951 [id number]  Subjective:  Patient is a 65 year old male staying temporarily at the [facility].  Objective:   Encounter Medications:  Outpatient Encounter Prescriptions as of 01/14/2016  Medication Sig  . acetaminophen (TYLENOL) 325 MG tablet Take 2 tablets (650 mg total) by mouth every 6 (six) hours as needed for mild pain. (Patient not taking: Reported on 12/07/2015)  . amLODipine (NORVASC) 10 MG tablet Take 10 mg by mouth daily.  [name] aspirin EC 81 MG tablet Take 81 mg by mouth daily.  . benazepril (LOTENSIN) 40 MG tablet Take 40 mg by mouth 2 (two) times daily.   [name] buPROPion (WELLBUTRIN XL) 150 MG 24 hr tablet Take 300 mg by mouth daily.   . busPIRone (BUSPAR) 10 MG tablet Take 10 mg by mouth at bedtime.  . cetirizine (ZYRTEC) 10 MG tablet Take 10 mg by mouth at bedtime as needed for allergies.   . clarithromycin (BIAXIN) 500 MG tablet Take 1 tablet (500 mg total) by mouth 2 (two) times daily. (Patient not taking: Reported on 12/07/2015)  . clopidogrel (PLAVIX) 75 MG tablet Take 1 tablet (75 mg total) by mouth daily.  . DULoxetine (CYMBALTA) 30 MG capsule Take 30-60 mg by mouth 2 (two) times daily. Pt takes two capsules in the morning and one at night.  . Exenatide ER (BYDUREON) 2 MG PEN Inject 2 mg into the skin once a week. Pt uses on Sunday.  . fenofibrate 160 MG tablet Take 1 tablet (160 mg total) by mouth every evening.  . fluticasone (FLONASE) 50 MCG/ACT nasal spray Place 2 sprays into both nostrils daily.  . hydrochlorothiazide (MICROZIDE) 12.5 MG capsule Take 12.5 mg by mouth daily.  . insulin aspart (NOVOLOG) 100 UNIT/ML injection Inject 5 Units into the skin 3 (three) times daily with meals.  . insulin glargine (LANTUS) 100 UNIT/ML injection Inject 0.4 mLs (40 Units total) into the skin at bedtime.  . metaxalone (SKELAXIN) 800 MG tablet Take 400-800 mg by mouth 3 (three) times daily  as needed for muscle spasms.  [name] NARCAN 4 MG/0.1ML LIQD Take 4 mg by mouth as needed (for opioid overdose).   . nitroGLYCERIN (NITROSTAT) 0.4 MG SL tablet Place 1 tablet (0.4 mg total) under the tongue every 5 (five) minutes x 3 doses as needed for chest pain.  [name] omega-3 acid ethyl esters (LOVAZA) 1 g capsule Take 1 capsule (1 g total) by mouth 2 (two) times daily.  . Oxcarbazepine (TRILEPTAL) 300 MG tablet Take 300-600 mg by mouth 2 (two) times daily. Pt takes one tablet in the morning and two at night.  [name] oxyCODONE (OXY IR/ROXICODONE) 5 MG immediate release tablet Take 1 tablet (5 mg total) by mouth 3 (three) times daily as needed for severe pain.  [name] oxyCODONE (OXYCONTIN) 20 mg 12 hr tablet Take 1 tablet (20 mg total) by mouth every 12 (twelve) hours.  . pantoprazole (PROTONIX) 40 MG tablet Take 40 mg by mouth daily.  . pregabalin (LYRICA) 200 MG capsule Take 200-400 mg by mouth 2 (two) times daily. Pt takes two capsules in the morning and one at night.  . tamsulosin (FLOMAX) 0.4 MG CAPS capsule Take 0.4 mg by mouth daily after supper.    No facility-administered encounter medications on file as of 01/14/2016.     Functional Status:  In your present state of health, do you have any difficulty performing the following activities:  12/08/2015 10/26/2015  Hearing? [place]? N -  Difficulty concentrating or making decisions? Y -  Walking or climbing stairs? Y -  Dressing or bathing? N -  Doing errands, shopping? Y N  Preparing Food and eating ? Y -  Using the Toilet? N -  In the past six months, have you accidently leaked urine? Y -  Do you have problems with loss of bowel control? N -  Managing your Medications? N -  Managing your Finances? N -  Housekeeping or managing your Housekeeping? Y -  Some recent data might be hidden    Fall/Depression Screening:  PHQ 2/9 Scores 10/08/2015 09/08/2015 08/12/2015 07/27/2015 07/22/2015 04/03/2015 01/20/2015  PHQ - 2 Score 3 6 6  - 4 6 6   PHQ- 9 Score 19  16 24  - 19 19 24   Exception Documentation - - - Patient refusal - - -    Assessment: Co-visit with RNCM Landis Martins. Patient states that he has submitted all the paperwork for the [facility] [place]  and is awaiting approval to move in [phone]. Per patient, the apartment is subsidized and has to be approved by Medicaid before he moves in. Patient confirms that he has received the 10 day supply of [employer] Well-Dine Meals.  Patient had an appointment with his Pulmologist today that will need to be re-scheduled.  Patient encouraged to follow up with all appointments. [place] appointments can always be re-arranged.  Patient has an appointment with his primary doctor on 01/19/16 at 11:30pm.  Transportation attempted to be arranged through [facility] transportation, however not enough notice provided.  Transportation to be requested from 12 and go.  [facility] PT/OT no longer coming, the nurse will follow up 3 additional times.  Late Entry-Transportation arranged through [employer] for patient to get to his primary care doctor on 01/19/16.  Plan: Follow up with patient in 1 month regarding permanent residence.    [name] [facility] Care Management [phone]

## 2016-01-14 NOTE — Patient Outreach (Addendum)
[place] [facility]) Care Management   01/14/2016  MASSON REMILLARD 1951/03/19 [id number]  Victor Wise is an 65 y.o. male  Subjective:  Patient states he is managing as well as he can.   Patient discussed that he has his previous Accucheck meter back now and has plenty of strips.Patient states doing the best he can with the food that he is able to have in [place] room, keeps at least 3 lean cusine meals in freezer. Patient states no recent hypoglycemic episode  Mr.Elwood denies any episodes of shortness of breath,swelling . Patient discussed his weight loss, now able to lie in bed and watch TV over his stomach.  Mr.Shannon reports that he has received new tubing for his CPAP that he uses nightly, patient states he is rescheduling his appointment with pulmonary doctor.   Objective:  BP 112/70 (Cuff Size: Large)   Pulse 62   Resp 18   Wt 250 lb (113.4 kg)   SpO2 98%   BMI 37.46 kg/m   Patient ambulating in his room at [facility] , has a quad cane, has already taken a shower this morning. Review of Systems  Constitutional: Negative.   HENT: Negative.   Eyes: Negative.   Respiratory: Negative.   Cardiovascular: Negative.   Gastrointestinal: Negative.   Skin: Negative.   Neurological: Negative.   Endo/Heme/Allergies: Negative.     Physical Exam  Constitutional: He is oriented to person, place, and time. He appears well-developed and well-nourished.  HENT:  Head: Normocephalic.  Cardiovascular: Normal rate, normal heart sounds and intact distal pulses.   Respiratory: Effort normal.  GI: Soft.  Neurological: He is alert and oriented to person, place, and time.  Skin: Skin is warm, dry and intact.  Bilateral feet dry and scaly   Psychiatric: He has a normal mood and affect. His behavior is normal. Judgment and thought content normal.    Encounter Medications:   Outpatient Encounter Prescriptions as of 01/14/2016  Medication Sig  . acetaminophen (TYLENOL) 325 MG tablet Take 2  tablets (650 mg total) by mouth every 6 (six) hours as needed for mild pain. (Patient not taking: Reported on 12/07/2015)  . amLODipine (NORVASC) 10 MG tablet Take 10 mg by mouth daily.  [name] aspirin EC 81 MG tablet Take 81 mg by mouth daily.  . benazepril (LOTENSIN) 40 MG tablet Take 40 mg by mouth 2 (two) times daily.   [name] buPROPion (WELLBUTRIN XL) 150 MG 24 hr tablet Take 300 mg by mouth daily.   . busPIRone (BUSPAR) 10 MG tablet Take 10 mg by mouth at bedtime.  . cetirizine (ZYRTEC) 10 MG tablet Take 10 mg by mouth at bedtime as needed for allergies.   . clarithromycin (BIAXIN) 500 MG tablet Take 1 tablet (500 mg total) by mouth 2 (two) times daily. (Patient not taking: Reported on 12/07/2015)  . clopidogrel (PLAVIX) 75 MG tablet Take 1 tablet (75 mg total) by mouth daily.  . DULoxetine (CYMBALTA) 30 MG capsule Take 30-60 mg by mouth 2 (two) times daily. Pt takes two capsules in the morning and one at night.  . Exenatide ER (BYDUREON) 2 MG PEN Inject 2 mg into the skin once a week. Pt uses on Sunday.  . fenofibrate 160 MG tablet Take 1 tablet (160 mg total) by mouth every evening.  . fluticasone (FLONASE) 50 MCG/ACT nasal spray Place 2 sprays into both nostrils daily.  . hydrochlorothiazide (MICROZIDE) 12.5 MG capsule Take 12.5 mg by mouth daily.  . insulin  aspart (NOVOLOG) 100 UNIT/ML injection Inject 5 Units into the skin 3 (three) times daily with meals.  . insulin glargine (LANTUS) 100 UNIT/ML injection Inject 0.4 mLs (40 Units total) into the skin at bedtime.  . metaxalone (SKELAXIN) 800 MG tablet Take 400-800 mg by mouth 3 (three) times daily as needed for muscle spasms.  [name] NARCAN 4 MG/0.1ML LIQD Take 4 mg by mouth as needed (for opioid overdose).   . nitroGLYCERIN (NITROSTAT) 0.4 MG SL tablet Place 1 tablet (0.4 mg total) under the tongue every 5 (five) minutes x 3 doses as needed for chest pain.  [name] omega-3 acid ethyl esters (LOVAZA) 1 g capsule Take 1 capsule (1 g total) by mouth 2 (two)  times daily.  . Oxcarbazepine (TRILEPTAL) 300 MG tablet Take 300-600 mg by mouth 2 (two) times daily. Pt takes one tablet in the morning and two at night.  [name] oxyCODONE (OXY IR/ROXICODONE) 5 MG immediate release tablet Take 1 tablet (5 mg total) by mouth 3 (three) times daily as needed for severe pain.  [name] oxyCODONE (OXYCONTIN) 20 mg 12 hr tablet Take 1 tablet (20 mg total) by mouth every 12 (twelve) hours.  . pantoprazole (PROTONIX) 40 MG tablet Take 40 mg by mouth daily.  . pregabalin (LYRICA) 200 MG capsule Take 200-400 mg by mouth 2 (two) times daily. Pt takes two capsules in the morning and one at night.  . tamsulosin (FLOMAX) 0.4 MG CAPS capsule Take 0.4 mg by mouth daily after supper.    No facility-administered encounter medications on file as of 01/14/2016.     Functional Status:   In your present state of health, do you have any difficulty performing the following activities: 12/08/2015 10/26/2015  Hearing? [place]? N -  Difficulty concentrating or making decisions? Y -  Walking or climbing stairs? Y -  Dressing or bathing? N -  Doing errands, shopping? Y N  Preparing Food and eating ? Y -  Using the Toilet? N -  In the past six months, have you accidently leaked urine? Y -  Do you have problems with loss of bowel control? N -  Managing your Medications? N -  Managing your Finances? N -  Housekeeping or managing your Housekeeping? Y -  Some recent data might be hidden    Fall/Depression Screening:    PHQ 2/9 Scores 10/08/2015 09/08/2015 08/12/2015 07/27/2015 07/22/2015 04/03/2015 01/20/2015  PHQ - 2 Score 3 6 6  - 4 6 6   PHQ- 9 Score 19 16 24  - 19 19 24   Exception Documentation - - - Patient refusal - - -    Assessment:  Routine home visit  Diabetes -  Improved blood sugar control, monitoring blood sugar,one recent hypoglycemic episode. Will review hypoglycemic treatment protocol  Fall -  No fall in last 3 weeks, needed reminding to use walker during visit. Home physical  therapy completed still followed by home health RN for 3 more weeks. Patient states he has driven short distances. Will benefit from PCP input on patient release to drive,patient states he will ask MD at visit on next week,declined for RN to discuss.  Heart Failure Patient now has scales available to weigh on, patient pleased with weight he has lost. Patient able to state signs of worsening heart failure to notify MD of . Continues to state he watches his salt as best he can with limited food choices. Reviewed salt content in some food choices available .   Plan:  Will plan next  home visit in month of September .  [facility] CM Care Plan Problem One   Flowsheet Row Most Recent Value  Care Plan Problem One  Knowledge deficit related to self care management of Diabetes as evidenced by elevated A1C  Role Documenting the Problem One  Care Management [place] for Problem One  Active  [facility] Long Term Goal (31-90 days)  Patient will be able to state strategies to manage Diabetes in the next 31 days   [facility] Long Term Goal Start Date  01/14/16  Interventions for Problem One Long Term Goal  Educated on importance of keeping Diabetes under control   [facility] CM Short Term Goal #1 (0-30 days)  Patient will continue to monitor blood sugar 3 times a day and record in the next 30 days    [facility] CM Short Term Goal #1 Start Date  01/14/16  Interventions for Short Term Goal #1  Reinforced to keep a record of blood sugars and take report to PCP appointment,   [facility] CM Short Term Goal #2 (0-30 days)  Patient will be able to state steps for treating hypoglycemia in the next 30 days   [facility] CM Short Term Goal #2 Start Date  01/14/16  Interventions for Short Term Goal #2  Provided and reviewed EMMI handout of treating low blood sugar  [facility] CM Short Term Goal #3 (0-30 days)  Patient will report weighing at least once a week and record in the next 30 days  [facility] CM Short Term Goal #3 Start Date  01/14/16  Interventions for  Short Tern Goal #3  Discussed importance of keeping track of weights, and notifying MD of sudden weight gains, swelling and shortness of breath, Provided and reviewed [facility] Handout on keeping track of weight each day.      Joylene Draft, RN, [place] Management [phone]- Mobile [phone]- Toll Free Main Office

## 2016-01-15 ENCOUNTER — Encounter: Payer: Self-pay | Admitting: *Deleted

## 2016-01-19 DIAGNOSIS — I10 Essential (primary) hypertension: Secondary | ICD-10-CM | POA: Diagnosis not present

## 2016-01-19 DIAGNOSIS — E1165 Type 2 diabetes mellitus with hyperglycemia: Secondary | ICD-10-CM | POA: Diagnosis not present

## 2016-01-19 DIAGNOSIS — N390 Urinary tract infection, site not specified: Secondary | ICD-10-CM | POA: Diagnosis not present

## 2016-01-19 DIAGNOSIS — R300 Dysuria: Secondary | ICD-10-CM | POA: Diagnosis not present

## 2016-01-20 DIAGNOSIS — N390 Urinary tract infection, site not specified: Secondary | ICD-10-CM | POA: Diagnosis not present

## 2016-01-20 DIAGNOSIS — Z0001 Encounter for general adult medical examination with abnormal findings: Secondary | ICD-10-CM | POA: Diagnosis not present

## 2016-01-21 DIAGNOSIS — G894 Chronic pain syndrome: Secondary | ICD-10-CM | POA: Diagnosis not present

## 2016-01-21 DIAGNOSIS — F4323 Adjustment disorder with mixed anxiety and depressed mood: Secondary | ICD-10-CM | POA: Diagnosis not present

## 2016-01-21 DIAGNOSIS — S42402D Unspecified fracture of lower end of left humerus, subsequent encounter for fracture with routine healing: Secondary | ICD-10-CM | POA: Diagnosis not present

## 2016-01-21 DIAGNOSIS — I10 Essential (primary) hypertension: Secondary | ICD-10-CM | POA: Diagnosis not present

## 2016-01-21 DIAGNOSIS — F329 Major depressive disorder, single episode, unspecified: Secondary | ICD-10-CM | POA: Diagnosis not present

## 2016-01-21 DIAGNOSIS — J45909 Unspecified asthma, uncomplicated: Secondary | ICD-10-CM | POA: Diagnosis not present

## 2016-01-21 DIAGNOSIS — I2510 Atherosclerotic heart disease of native coronary artery without angina pectoris: Secondary | ICD-10-CM | POA: Diagnosis not present

## 2016-01-21 DIAGNOSIS — I252 Old myocardial infarction: Secondary | ICD-10-CM | POA: Diagnosis not present

## 2016-01-21 DIAGNOSIS — E119 Type 2 diabetes mellitus without complications: Secondary | ICD-10-CM | POA: Diagnosis not present

## 2016-01-21 DIAGNOSIS — F341 Dysthymic disorder: Secondary | ICD-10-CM | POA: Diagnosis not present

## 2016-01-21 DIAGNOSIS — I69393 Ataxia following cerebral infarction: Secondary | ICD-10-CM | POA: Diagnosis not present

## 2016-01-21 DIAGNOSIS — Z48812 Encounter for surgical aftercare following surgery on the circulatory system: Secondary | ICD-10-CM | POA: Diagnosis not present

## 2016-01-24 DIAGNOSIS — G4733 Obstructive sleep apnea (adult) (pediatric): Secondary | ICD-10-CM | POA: Diagnosis not present

## 2016-01-26 ENCOUNTER — Other Ambulatory Visit: Payer: Self-pay | Admitting: *Deleted

## 2016-01-26 NOTE — Patient Outreach (Signed)
[facility]) Care Management  01/26/2016  VERNER REAMES 02-04-1951 [id number]   Phone call to patient to re-schedule home visit.  Home visit re-scheduled for 01/28/16 at 9:30am to discuss progress towards moving it to his own apartment. RNCM Landis Martins to be present for visit as well.  [name] [facility] Care Management [phone]

## 2016-01-27 ENCOUNTER — Ambulatory Visit: Payer: Self-pay | Admitting: *Deleted

## 2016-01-27 DIAGNOSIS — G894 Chronic pain syndrome: Secondary | ICD-10-CM | POA: Diagnosis not present

## 2016-01-27 DIAGNOSIS — E119 Type 2 diabetes mellitus without complications: Secondary | ICD-10-CM | POA: Diagnosis not present

## 2016-01-27 DIAGNOSIS — F329 Major depressive disorder, single episode, unspecified: Secondary | ICD-10-CM | POA: Diagnosis not present

## 2016-01-27 DIAGNOSIS — I252 Old myocardial infarction: Secondary | ICD-10-CM | POA: Diagnosis not present

## 2016-01-27 DIAGNOSIS — I10 Essential (primary) hypertension: Secondary | ICD-10-CM | POA: Diagnosis not present

## 2016-01-27 DIAGNOSIS — S42402D Unspecified fracture of lower end of left humerus, subsequent encounter for fracture with routine healing: Secondary | ICD-10-CM | POA: Diagnosis not present

## 2016-01-27 DIAGNOSIS — J45909 Unspecified asthma, uncomplicated: Secondary | ICD-10-CM | POA: Diagnosis not present

## 2016-01-27 DIAGNOSIS — Z48812 Encounter for surgical aftercare following surgery on the circulatory system: Secondary | ICD-10-CM | POA: Diagnosis not present

## 2016-01-27 DIAGNOSIS — I2510 Atherosclerotic heart disease of native coronary artery without angina pectoris: Secondary | ICD-10-CM | POA: Diagnosis not present

## 2016-01-28 ENCOUNTER — Other Ambulatory Visit: Payer: Self-pay | Admitting: *Deleted

## 2016-01-28 NOTE — Patient Outreach (Signed)
[facility] ([facility]) Care Management   01/28/2016  Victor Wise 06/25/1950 [id number]  Victor Wise is an 65 y.o. male  Subjective:   Victor Wise discussed his recent visit to PCP office and diagnoses on urinary tract infection, and antibiotics prescribed.  Victor Wise discussed his chronic pain that he has in his back, has taken Oxycontin, and uses prn immediate release oxycodone . Patient discussed how he recently picked up a 32 pack of water at grocery store to put in cart and that has gotten his back feeling a worse than normal, he had assistance with getting water into his vehicle and into motel room.   Patient states that he has been checking his blood sugars regularly and taking insulins as prescribed.  Patient denies having a fall since last visit, patient states he has been giving clearance from his doctor to drive short distance of 5 miles and not use interstate. Patient states that this will allow him to drive to his doctors appointments.      Objective:  BP 120/78 (BP Location: Left Arm, Patient Position: Sitting, Cuff Size: Large)   Pulse 63   Resp 18   SpO2 95%   Patient sitting in wheelchair during visit and able to move around room freely. Patient stood on scales during visit, very wobbly and weight flucuating , question accuracy. Review of Systems  Constitutional: Negative.   HENT: Negative.   Eyes: Negative.   Respiratory: Negative.   Cardiovascular: Negative.   Gastrointestinal: Negative.   Genitourinary: Negative.   Musculoskeletal: Positive for back pain.  Skin: Negative.   Neurological: Negative.     Physical Exam  Constitutional: He is oriented to person, place, and time. He appears well-developed and well-nourished.  Cardiovascular: Normal rate and normal heart sounds.   Respiratory: Effort normal.  GI: Soft.  Neurological: He is alert and oriented to person, place, and time.  Skin: Skin is warm and dry.  Psychiatric: He has a normal mood and  affect. His behavior is normal. Judgment and thought content normal.    Encounter Medications:   Outpatient Encounter Prescriptions as of 01/28/2016  Medication Sig Note  . amLODipine (NORVASC) 10 MG tablet Take 10 mg by mouth daily.   . aspirin EC 81 MG tablet Take 81 mg by mouth daily.   . benazepril (LOTENSIN) 40 MG tablet Take 40 mg by mouth 2 (two) times daily.    . buPROPion (WELLBUTRIN XL) 150 MG 24 hr tablet Take 300 mg by mouth daily.    . busPIRone (BUSPAR) 10 MG tablet Take 10 mg by mouth at bedtime.   . cetirizine (ZYRTEC) 10 MG tablet Take 10 mg by mouth at bedtime as needed for allergies.    . clopidogrel (PLAVIX) 75 MG tablet Take 1 tablet (75 mg total) by mouth daily.   . DULoxetine (CYMBALTA) 30 MG capsule Take 30-60 mg by mouth 2 (two) times daily. Pt takes two capsules in the morning and one at night.   . Exenatide ER (BYDUREON) 2 MG PEN Inject 2 mg into the skin once a week. Pt uses on Sunday.   . fenofibrate 160 MG tablet Take 1 tablet (160 mg total) by mouth every evening.   . fluticasone (FLONASE) 50 MCG/ACT nasal spray Place 2 sprays into both nostrils daily.   . hydrochlorothiazide (MICROZIDE) 12.5 MG capsule Take 12.5 mg by mouth daily.   . insulin aspart (NOVOLOG) 100 UNIT/ML injection Inject 5 Units into the skin 3 (three) times daily with meals.   .   insulin glargine (LANTUS) 100 UNIT/ML injection Inject 0.4 mLs (40 Units total) into the skin at bedtime.   . NARCAN 4 MG/0.1ML LIQD Take 4 mg by mouth as needed (for opioid overdose).  01/28/2016: Has on hand  . nitroGLYCERIN (NITROSTAT) 0.4 MG SL tablet Place 1 tablet (0.4 mg total) under the tongue every 5 (five) minutes x 3 doses as needed for chest pain.   . omega-3 acid ethyl esters (LOVAZA) 1 g capsule Take 1 capsule (1 g total) by mouth 2 (two) times daily.   . Oxcarbazepine (TRILEPTAL) 300 MG tablet Take 300-600 mg by mouth 2 (two) times daily. Pt takes one tablet in the morning and two at night.   . oxyCODONE  (OXYCONTIN) 20 mg 12 hr tablet Take 1 tablet (20 mg total) by mouth every 12 (twelve) hours.   . pantoprazole (PROTONIX) 40 MG tablet Take 40 mg by mouth daily.   . pregabalin (LYRICA) 200 MG capsule Take 200-400 mg by mouth 2 (two) times daily. Pt takes two capsules in the morning and one at night.   . tamsulosin (FLOMAX) 0.4 MG CAPS capsule Take 0.4 mg by mouth daily after supper.    . acetaminophen (TYLENOL) 325 MG tablet Take 2 tablets (650 mg total) by mouth every 6 (six) hours as needed for mild pain. (Patient not taking: Reported on 01/28/2016)   . clarithromycin (BIAXIN) 500 MG tablet Take 1 tablet (500 mg total) by mouth 2 (two) times daily. (Patient not taking: Reported on 01/28/2016)   . metaxalone (SKELAXIN) 800 MG tablet Take 400-800 mg by mouth 3 (three) times daily as needed for muscle spasms.   . oxyCODONE (OXY IR/ROXICODONE) 5 MG immediate release tablet Take 1 tablet (5 mg total) by mouth 3 (three) times daily as needed for severe pain. (Patient not taking: Reported on 01/28/2016)    No facility-administered encounter medications on file as of 01/28/2016.     Functional Status:   In your present state of health, do you have any difficulty performing the following activities: 01/28/2016 12/08/2015  Hearing? Y Y  Vision? N N  Difficulty concentrating or making decisions? Y Y  Walking or climbing stairs? Y Y  Dressing or bathing? N N  Doing errands, shopping? Y Y  Preparing Food and eating ? Y Y  Using the Toilet? N N  In the past six months, have you accidently leaked urine? Y Y  Do you have problems with loss of bowel control? N N  Managing your Medications? N N  Managing your Finances? N N  Housekeeping or managing your Housekeeping? Y Y  Some recent data might be hidden    Fall/Depression Screening:    PHQ 2/9 Scores 10/08/2015 09/08/2015 08/12/2015 07/27/2015 07/22/2015 04/03/2015 01/20/2015  PHQ - 2 Score 3 6 6 - 4 6 6  PHQ- 9 Score 19 16 24 - 19 19 24  Exception  Documentation - - - Patient refusal - - -   Fall Risk  12/08/2015 10/08/2015 09/08/2015 08/12/2015 07/27/2015  Falls in the past year? Yes Yes Yes Yes Yes  Number falls in past yr: 2 or more 2 or more 2 or more 2 or more 2 or more  Injury with Fall? No No No No No  Risk Factor Category  High Fall Risk High Fall Risk - High Fall Risk High Fall Risk  Risk for fall due to : Impaired balance/gait;History of fall(s) History of fall(s);Impaired balance/gait History of fall(s);Impaired balance/gait;Impaired mobility History of fall(s);Impaired balance/gait;Impaired mobility   History of fall(s);Impaired balance/gait  Risk for fall due to (comments): - - - - -  Follow up Falls evaluation completed;Falls prevention discussed Falls prevention discussed Falls prevention discussed Falls evaluation completed;Falls prevention discussed Falls prevention discussed   Assessment:  Routine home visit, covisit with Chrystal Land, LCSW.  One of Mr.Woods' major concern today is being able to get all needed information for being able to get apartment.   Urinary Tract infection - taking antibiotics as prescribed, reports improved symptoms. Able to state symptoms of urinary tract infection to notify MD of.  Diabetes- better control , 30 day average 138, no hypoglycemic episodes, watching carbohydrates as best as possible with limiitations.   Fall Risk- no recent falls , needs reminding to use his cane  Chronic Pain- taking medications as prescribed, discussed limiting picking up heavy objects that will alter balance and increase fall risk.  Heart Failure- No increase in swelling shortness of breath, question accuracy of weight with patient standing on scales, patient a little wobbly on scale when trying to balance. Able to states yellow zone symptoms to call    Plan:  Will plan return home visit in next month.  Patient will notify MD for worsening symptoms of UTI   [facility] CM Care Plan Problem One   Flowsheet Row Most  Recent Value  Care Plan Problem One  Knowledge deficit related to self care management of Diabetes as evidenced by elevated A1C  Role Documenting the Problem One  Care Management Coordinator  Care Plan for Problem One  Active  [facility] Long Term Goal (31-90 days)  -- [goal date restart ]  [facility] Long Term Goal Start Date  01/28/16  Interventions for Problem One Long Term Goal  Educated on importance of keeping Diabetes under control   [facility] CM Short Term Goal #1 (0-30 days)  Patient will begin to monitor blood sugar at least twice daily in the next 30 days   [facility] CM Short Term Goal #1 Start Date  01/28/16 [date restarted ]  Interventions for Short Term Goal #1  reinforced checking blood sugar prior to adminitering insulin importance   [facility] CM Short Term Goal #2 (0-30 days)  Patient will be able to state steps for treating hypoglycemia in the next 30 days   [facility] CM Short Term Goal #2 Start Date  01/14/16  [facility] CM Short Term Goal #2 Met Date  01/28/16  [facility] CM Short Term Goal #3 (0-30 days)  Patient will report weighing at least once a week and record in the next 30 days  [facility] CM Short Term Goal #3 Start Date  01/14/16      , RN, PCCN [facility] Care Management [phone]- Mobile [phone]- Toll Free Main Office    

## 2016-01-28 NOTE — Patient Outreach (Signed)
[facility]) Care Management  [facility] Social Work  01/28/2016  Victor Wise 1951-05-01 [id number]  Subjective:  Patient is a 65 year old male, currently residing in a [employer] until his approval to move into subsidized  housing.  Objective:   Encounter Medications:  Outpatient Encounter Prescriptions as of 01/28/2016  Medication Sig  . acetaminophen (TYLENOL) 325 MG tablet Take 2 tablets (650 mg total) by mouth every 6 (six) hours as needed for mild pain. (Patient not taking: Reported on 12/07/2015)  . amLODipine (NORVASC) 10 MG tablet Take 10 mg by mouth daily.  [name] aspirin EC 81 MG tablet Take 81 mg by mouth daily.  . benazepril (LOTENSIN) 40 MG tablet Take 40 mg by mouth 2 (two) times daily.   [name] buPROPion (WELLBUTRIN XL) 150 MG 24 hr tablet Take 300 mg by mouth daily.   . busPIRone (BUSPAR) 10 MG tablet Take 10 mg by mouth at bedtime.  . cetirizine (ZYRTEC) 10 MG tablet Take 10 mg by mouth at bedtime as needed for allergies.   . clarithromycin (BIAXIN) 500 MG tablet Take 1 tablet (500 mg total) by mouth 2 (two) times daily. (Patient not taking: Reported on 12/07/2015)  . clopidogrel (PLAVIX) 75 MG tablet Take 1 tablet (75 mg total) by mouth daily.  . DULoxetine (CYMBALTA) 30 MG capsule Take 30-60 mg by mouth 2 (two) times daily. Pt takes two capsules in the morning and one at night.  . Exenatide ER (BYDUREON) 2 MG PEN Inject 2 mg into the skin once a week. Pt uses on Sunday.  . fenofibrate 160 MG tablet Take 1 tablet (160 mg total) by mouth every evening.  . fluticasone (FLONASE) 50 MCG/ACT nasal spray Place 2 sprays into both nostrils daily.  . hydrochlorothiazide (MICROZIDE) 12.5 MG capsule Take 12.5 mg by mouth daily.  . insulin aspart (NOVOLOG) 100 UNIT/ML injection Inject 5 Units into the skin 3 (three) times daily with meals.  . insulin glargine (LANTUS) 100 UNIT/ML injection Inject 0.4 mLs (40 Units total) into the skin at bedtime.  . metaxalone (SKELAXIN) 800 MG  tablet Take 400-800 mg by mouth 3 (three) times daily as needed for muscle spasms.  [name] NARCAN 4 MG/0.1ML LIQD Take 4 mg by mouth as needed (for opioid overdose).   . nitroGLYCERIN (NITROSTAT) 0.4 MG SL tablet Place 1 tablet (0.4 mg total) under the tongue every 5 (five) minutes x 3 doses as needed for chest pain.  [name] omega-3 acid ethyl esters (LOVAZA) 1 g capsule Take 1 capsule (1 g total) by mouth 2 (two) times daily.  . Oxcarbazepine (TRILEPTAL) 300 MG tablet Take 300-600 mg by mouth 2 (two) times daily. Pt takes one tablet in the morning and two at night.  [name] oxyCODONE (OXY IR/ROXICODONE) 5 MG immediate release tablet Take 1 tablet (5 mg total) by mouth 3 (three) times daily as needed for severe pain. (Patient not taking: Reported on 01/14/2016)  . oxyCODONE (OXYCONTIN) 20 mg 12 hr tablet Take 1 tablet (20 mg total) by mouth every 12 (twelve) hours.  . pantoprazole (PROTONIX) 40 MG tablet Take 40 mg by mouth daily.  . pregabalin (LYRICA) 200 MG capsule Take 200-400 mg by mouth 2 (two) times daily. Pt takes two capsules in the morning and one at night.  . tamsulosin (FLOMAX) 0.4 MG CAPS capsule Take 0.4 mg by mouth daily after supper.    No facility-administered encounter medications on file as of 01/28/2016.     Functional Status:  In your present state of health, do you have any difficulty performing the following activities: 12/08/2015 10/26/2015  Hearing? [place]? N -  Difficulty concentrating or making decisions? Y -  Walking or climbing stairs? Y -  Dressing or bathing? N -  Doing errands, shopping? Y N  Preparing Food and eating ? Y -  Using the Toilet? N -  In the past six months, have you accidently leaked urine? Y -  Do you have problems with loss of bowel control? N -  Managing your Medications? N -  Managing your Finances? N -  Housekeeping or managing your Housekeeping? Y -  Some recent data might be hidden    Fall/Depression Screening:  PHQ 2/9 Scores 10/08/2015 09/08/2015  08/12/2015 07/27/2015 07/22/2015 04/03/2015 01/20/2015  PHQ - 2 Score 3 6 6  - 4 6 6   PHQ- 9 Score 19 16 24  - 19 19 24   Exception Documentation - - - Patient refusal - - -    Assessment: Patient continues to reside at the [facility] until his approval for subsidized housing.  Per patient, they are still in need of more documentation to assess his approval. Per patient, he is need of a letter stating that he cashed out of his 401K through [employer].  Per patient, he is working on getting this needed information. With patient's consent phone call made to [facility], spoke with Almon Register to request needed paperwork. Per Ron, he understands the urgency of the request and will call this social worker back by the end of the day.  Patient received a denial letter for medicaid dated 01/04/16.  Patient received a bill from his skilled nursing stay at [facility], phone call made to [employer] billing department, message left with Leonides Sake to assist in addressing patient's responsibility for charges incurred. Per patient, his doctor has cleared him to drive no more that 5 miles from home. Per patient, he is able to drive himself to local appointments.  However, transportation was arranged for patient on 01/19/16. According to patient, they arrived too late to transport him to his scheduled appointment and he ended up driving himself.  Plan: This social worker to follow up with patient in 1 month regarding eligibility process for his apartment.

## 2016-02-01 DIAGNOSIS — R0602 Shortness of breath: Secondary | ICD-10-CM | POA: Diagnosis not present

## 2016-02-01 DIAGNOSIS — G4733 Obstructive sleep apnea (adult) (pediatric): Secondary | ICD-10-CM | POA: Diagnosis not present

## 2016-02-10 DIAGNOSIS — C44319 Basal cell carcinoma of skin of other parts of face: Secondary | ICD-10-CM | POA: Diagnosis not present

## 2016-02-10 DIAGNOSIS — R262 Difficulty in walking, not elsewhere classified: Secondary | ICD-10-CM | POA: Diagnosis not present

## 2016-02-10 DIAGNOSIS — M6281 Muscle weakness (generalized): Secondary | ICD-10-CM | POA: Diagnosis not present

## 2016-02-10 DIAGNOSIS — G464 Cerebellar stroke syndrome: Secondary | ICD-10-CM | POA: Diagnosis not present

## 2016-02-12 ENCOUNTER — Emergency Department: Payer: Commercial Managed Care - HMO

## 2016-02-12 ENCOUNTER — Emergency Department
Admission: EM | Admit: 2016-02-12 | Discharge: 2016-02-12 | Disposition: A | Discharge status: Home / Self Care | Payer: Commercial Managed Care - HMO | Attending: Emergency Medicine | Admitting: Emergency Medicine

## 2016-02-12 DIAGNOSIS — X500XXA Overexertion from strenuous movement or load, initial encounter: Secondary | ICD-10-CM | POA: Diagnosis not present

## 2016-02-12 DIAGNOSIS — R040 Epistaxis: Secondary | ICD-10-CM | POA: Diagnosis not present

## 2016-02-12 DIAGNOSIS — Y92039 Unspecified place in apartment as the place of occurrence of the external cause: Secondary | ICD-10-CM | POA: Insufficient documentation

## 2016-02-12 DIAGNOSIS — Y999 Unspecified external cause status: Secondary | ICD-10-CM | POA: Insufficient documentation

## 2016-02-12 DIAGNOSIS — S0990XA Unspecified injury of head, initial encounter: Secondary | ICD-10-CM | POA: Diagnosis not present

## 2016-02-12 DIAGNOSIS — Z8673 Personal history of transient ischemic attack (TIA), and cerebral infarction without residual deficits: Secondary | ICD-10-CM | POA: Insufficient documentation

## 2016-02-12 DIAGNOSIS — S01511A Laceration without foreign body of lip, initial encounter: Secondary | ICD-10-CM | POA: Insufficient documentation

## 2016-02-12 DIAGNOSIS — Z87891 Personal history of nicotine dependence: Secondary | ICD-10-CM | POA: Diagnosis not present

## 2016-02-12 DIAGNOSIS — S80211A Abrasion, right knee, initial encounter: Secondary | ICD-10-CM | POA: Insufficient documentation

## 2016-02-12 DIAGNOSIS — Z79899 Other long term (current) drug therapy: Secondary | ICD-10-CM | POA: Insufficient documentation

## 2016-02-12 DIAGNOSIS — E119 Type 2 diabetes mellitus without complications: Secondary | ICD-10-CM | POA: Diagnosis not present

## 2016-02-12 DIAGNOSIS — S0081XA Abrasion of other part of head, initial encounter: Secondary | ICD-10-CM | POA: Diagnosis not present

## 2016-02-12 DIAGNOSIS — Z794 Long term (current) use of insulin: Secondary | ICD-10-CM | POA: Insufficient documentation

## 2016-02-12 DIAGNOSIS — I252 Old myocardial infarction: Secondary | ICD-10-CM | POA: Diagnosis not present

## 2016-02-12 DIAGNOSIS — I10 Essential (primary) hypertension: Secondary | ICD-10-CM | POA: Insufficient documentation

## 2016-02-12 DIAGNOSIS — Z7982 Long term (current) use of aspirin: Secondary | ICD-10-CM | POA: Diagnosis not present

## 2016-02-12 DIAGNOSIS — Y9389 Activity, other specified: Secondary | ICD-10-CM | POA: Diagnosis not present

## 2016-02-12 DIAGNOSIS — S0083XA Contusion of other part of head, initial encounter: Secondary | ICD-10-CM

## 2016-02-12 DIAGNOSIS — IMO0002 Reserved for concepts with insufficient information to code with codable children: Secondary | ICD-10-CM

## 2016-02-12 DIAGNOSIS — R52 Pain, unspecified: Secondary | ICD-10-CM

## 2016-02-12 LAB — CBC WITH DIFFERENTIAL/PLATELET
BASOS ABS: 0.1 10*3/uL (ref 0–0.1)
Basophils Relative: 1 %
EOS ABS: 0.1 10*3/uL (ref 0–0.7)
EOS PCT: 1 %
HCT: 37.9 % — ABNORMAL LOW (ref 40.0–52.0)
Hemoglobin: 12.5 g/dL — ABNORMAL LOW (ref 13.0–18.0)
LYMPHS PCT: 18 %
Lymphs Abs: 1.2 10*3/uL (ref 1.0–3.6)
MCH: 26 pg (ref 26.0–34.0)
MCHC: 33.1 g/dL (ref 32.0–36.0)
MCV: 78.3 fL — AB (ref 80.0–100.0)
MONO ABS: 0.5 10*3/uL (ref 0.2–1.0)
Monocytes Relative: 7 %
Neutro Abs: 4.8 10*3/uL (ref 1.4–6.5)
Neutrophils Relative %: 73 %
PLATELETS: 220 10*3/uL (ref 150–440)
RBC: 4.83 MIL/uL (ref 4.40–5.90)
RDW: 16.3 % — AB (ref 11.5–14.5)
WBC: 6.6 10*3/uL (ref 3.8–10.6)

## 2016-02-12 LAB — COMPREHENSIVE METABOLIC PANEL
ALBUMIN: 3.8 g/dL (ref 3.5–5.0)
ALK PHOS: 51 U/L (ref 38–126)
ALT: 12 U/L — AB (ref 17–63)
AST: 20 U/L (ref 15–41)
Anion gap: 9 (ref 5–15)
BILIRUBIN TOTAL: 0.4 mg/dL (ref 0.3–1.2)
BUN: 21 mg/dL — AB (ref 6–20)
CALCIUM: 8.7 mg/dL — AB (ref 8.9–10.3)
CO2: 24 mmol/L (ref 22–32)
CREATININE: 1.34 mg/dL — AB (ref 0.61–1.24)
Chloride: 99 mmol/L — ABNORMAL LOW (ref 101–111)
GFR calc Af Amer: >60 mL/min (ref >60)
GFR, EST NON AFRICAN AMERICAN: 54 mL/min — AB (ref >60)
GLUCOSE: 193 mg/dL — AB (ref 65–99)
POTASSIUM: 4.1 mmol/L (ref 3.5–5.1)
Sodium: 132 mmol/L — ABNORMAL LOW (ref 135–145)
TOTAL PROTEIN: 6.8 g/dL (ref 6.5–8.1)

## 2016-02-12 LAB — GLUCOSE, CAPILLARY: Glucose-Capillary: 196 mg/dL — ABNORMAL HIGH (ref 65–99)

## 2016-02-12 MED ORDER — LIDOCAINE-EPINEPHRINE (PF) 1 %-1:200000 IJ SOLN
10.0000 mL | Freq: Once | INTRAMUSCULAR | Status: DC
  Filled 2016-02-12: qty 30

## 2016-02-12 MED ORDER — CLARITHROMYCIN 500 MG PO TABS: 500.0000 mg | ORAL_TABLET | Freq: Two times a day (BID) | ORAL | 0 refills | Status: DC

## 2016-02-12 MED ORDER — LIDOCAINE HCL (PF) 1 % IJ SOLN
INTRAMUSCULAR | Status: AC
  Filled 2016-02-12: qty 5

## 2016-02-12 NOTE — ED Triage Notes (Signed)
Laceration to lower lip after falling. Pt states he was carrying a bag that was too heavy for him. CBG 141 PTA. Denies hitting head.

## 2016-02-12 NOTE — ED Notes (Signed)
AAOx3.  Skin warm and dry.  NAD 

## 2016-02-12 NOTE — Discharge Instructions (Signed)
Begin taking antibiotics tomorrow to prevent infection of your lip. Keep outer lip clean and dry clean daily with mild soap and water. Lip will be swollen and tender. You will also see bruising. Soft diet for the next several days due to your lip swelling. Also rinse mouth out with warm salt water after each meal. Follow-up with her primary care doctor if any continued problems. Watch abrasions on your forehead and knee for any signs of infection and clean daily with mild soap and water.

## 2016-02-12 NOTE — ED Provider Notes (Signed)
[facility] Emergency Department Provider Note  ____________________________________________   First MD Initiated Contact with Patient 02/12/16 1638     (approximate)  I have reviewed the triage vital signs and the nursing notes.   HISTORY  Chief Complaint Lip Laceration   HPI Victor Wise is a 65 y.o. male is here with complaint of lip laceration. Patient states that he was carrying a bag that was too heavy for him causing him to fall forward. Patient states that he has been moving things out of an apartment in into a new place. He denies hitting his head or any loss of consciousness. Patient states that it had been 6 hours since he last ate and felt that his blood sugar was getting low. He states that he opened a container of pasta and began eating it.Patient states when he fell he did bite his lip causing a laceration and also he feels as if there is a chipped tooth. He denies any neck pain. He denies any chest pain, shortness of breath, diaphoresis, visual changes, dizziness, headache. Currently his nosebleed has resolved. Patient has pacemaker and also is on Plavix. He rates his pain as an 8/10.   Past Medical History:  Diagnosis Date  . Allergy   . Basal cell carcinoma    forehead  . BPH (benign prostatic hyperplasia)   . Chronic back pain   . Depression   . Diabetes ([place])   . GERD (gastroesophageal reflux disease)   . Heart attack ([place])   . Hypertension   . Morbid obesity ([place])   . Obstructive sleep apnea   . 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 Status post insertion of spinal cord stimulator   . Stroke ([place])   . UTI (lower urinary tract infection)     Patient Active Problem List   Diagnosis Date Noted  . Sick sinus syndrome ([place]) 10/28/2015  . Syncopal episodes 10/26/2015  . Syncope 10/26/2015  . Adjustment disorder with mixed anxiety and depressed mood 10/20/2015  . Dysthymia 10/20/2015  . CVA (cerebral infarction) 10/19/2015  . Urinary  retention 03/25/2015  . 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 Acute MI, anterolateral wall, subsequent episode of care ([place]) 12/19/2014  . SIRS (systemic inflammatory response syndrome) ([place]) 12/18/2014    Past Surgical History:  Procedure Laterality Date  . CARDIAC CATHETERIZATION N/A 12/19/2014   Procedure: Coronary Stent Intervention;  Surgeon: Charolette Forward, MD;  Location: [place] [facility];  Service: Cardiovascular;  Laterality: N/A;  . CARDIAC CATHETERIZATION Left 12/19/2014   Procedure: Left Heart Cath and Coronary Angiography;  Surgeon: Dionisio Raydan, MD;  Location: [place] [facility];  Service: Cardiovascular;  Laterality: Left;  . PACEMAKER INSERTION Left 11/02/2015   Procedure: INSERTION PACEMAKER;  Surgeon: Isaias Cowman, MD;  Location: [facility] OR[facility];  Service: Cardiovascular;  Laterality: Left;  . Pain Stimulator    . Right toe amputation      Prior to Admission medications   Medication Sig Start Date End Date Taking? Authorizing Provider  acetaminophen (TYLENOL) 325 MG tablet Take 2 tablets (650 mg total) by mouth every 6 (six) hours as needed for mild pain. Patient not taking: Reported on 01/28/2016 11/03/15   Dustin Flock, MD  amLODipine (NORVASC) 10 MG tablet Take 10 mg by mouth daily.    Historical Provider, MD  aspirin EC 81 MG tablet Take 81 mg by mouth daily.    Historical Provider, MD  benazepril (LOTENSIN) 40 MG tablet Take 40 mg by mouth  2 (two) times daily.     Historical Provider, MD  buPROPion (WELLBUTRIN XL) 150 MG 24 hr tablet Take 300 mg by mouth daily.     Historical Provider, MD  busPIRone (BUSPAR) 10 MG tablet Take 10 mg by mouth at bedtime.    Historical Provider, MD  cetirizine (ZYRTEC) 10 MG tablet Take 10 mg by mouth at bedtime as needed for allergies.     Historical Provider, MD  clarithromycin (BIAXIN) 500 MG tablet Take 1 tablet (500 mg total) by mouth 2 (two) times daily. 02/12/16   Johnn Hai, PA-C  clopidogrel (PLAVIX) 75 MG tablet Take 1  tablet (75 mg total) by mouth daily. 10/21/15   Nicholes Mango, MD  DULoxetine (CYMBALTA) 30 MG capsule Take 30-60 mg by mouth 2 (two) times daily. Pt takes two capsules in the morning and one at night.    Historical Provider, MD  Exenatide ER (BYDUREON) 2 MG PEN Inject 2 mg into the skin once a week. Pt uses on Sunday.    Historical Provider, MD  fenofibrate 160 MG tablet Take 1 tablet (160 mg total) by mouth every evening. 10/21/15   Nicholes Mango, MD  fluticasone (FLONASE) 50 MCG/ACT nasal spray Place 2 sprays into both nostrils daily.    Historical Provider, MD  hydrochlorothiazide (MICROZIDE) 12.5 MG capsule Take 12.5 mg by mouth daily.    Historical Provider, MD  insulin aspart (NOVOLOG) 100 UNIT/ML injection Inject 5 Units into the skin 3 (three) times daily with meals. 11/03/15   Dustin Flock, MD  insulin glargine (LANTUS) 100 UNIT/ML injection Inject 0.4 mLs (40 Units total) into the skin at bedtime. 11/03/15   Dustin Flock, MD  metaxalone (SKELAXIN) 800 MG tablet Take 400-800 mg by mouth 3 (three) times daily as needed for muscle spasms.    Historical Provider, MD  NARCAN 4 MG/0.1ML LIQD Take 4 mg by mouth as needed (for opioid overdose).     Historical Provider, MD  nitroGLYCERIN (NITROSTAT) 0.4 MG SL tablet Place 1 tablet (0.4 mg total) under the tongue every 5 (five) minutes x 3 doses as needed for chest pain. 12/21/14   Dixie Dials, MD  omega-3 acid ethyl esters (LOVAZA) 1 g capsule Take 1 capsule (1 g total) by mouth 2 (two) times daily. 10/21/15   Nicholes Mango, MD  Oxcarbazepine (TRILEPTAL) 300 MG tablet Take 300-600 mg by mouth 2 (two) times daily. Pt takes one tablet in the morning and two at night.    Historical Provider, MD  oxyCODONE (OXY IR/ROXICODONE) 5 MG immediate release tablet Take 1 tablet (5 mg total) by mouth 3 (three) times daily as needed for severe pain. Patient not taking: Reported on 01/28/2016 11/03/15   Dustin Flock, MD  oxyCODONE (OXYCONTIN) 20 mg 12 hr tablet Take 1 tablet  (20 mg total) by mouth every 12 (twelve) hours. 11/03/15   Dustin Flock, MD  pantoprazole (PROTONIX) 40 MG tablet Take 40 mg by mouth daily.    Historical Provider, MD  pregabalin (LYRICA) 200 MG capsule Take 200-400 mg by mouth 2 (two) times daily. Pt takes two capsules in the morning and one at night.    Historical Provider, MD  tamsulosin (FLOMAX) 0.4 MG CAPS capsule Take 0.4 mg by mouth daily after supper.     Historical Provider, MD    Allergies Amoxicillin; Other; Penicillins; Hydralazine; Crestor [rosuvastatin calcium]; Metoprolol; Statins; Sulfa antibiotics; Yellow dyes (non-tartrazine); Aspirin; and Tape  Family History  Problem Relation Age of Onset  . Breast  cancer Mother   . Cancer Mother   . Hypertension Mother   . Lung cancer Father   . Hypertension Father   . Heart disease Father   . Cancer Father   . Kidney disease Sister   . Prostate cancer Neg Hx     Social History Social History  Substance Use Topics  . Smoking status: Former Smoker    Types: Cigarettes  . Smokeless tobacco: Never Used     Comment: quit 45 years  . Alcohol use 0.0 oz/week     Comment: occasionally    Review of Systems Constitutional: No fever/chills Eyes: No visual changes. ENT: Positive upper dental pain. Positive laceration of lip. Positive nosebleed. Cardiovascular: Denies chest pain. Respiratory: Denies shortness of breath. Gastrointestinal: No abdominal pain.  No nausea, no vomiting.   Musculoskeletal: Negative for back pain. Skin: Positive for laceration to lip. Positive for abrasions. Neurological: Negative for headaches, focal weakness or numbness.  10-point ROS otherwise negative.  ____________________________________________   PHYSICAL EXAM:  VITAL SIGNS: ED Triage Vitals  Enc Vitals Group     BP 02/12/16 1625 100/61     Pulse Rate 02/12/16 1625 87     Resp 02/12/16 1625 18     Temp --      Temp src --      SpO2 02/12/16 1625 97 %     Weight 02/12/16 1626 250  lb (113.4 kg)     Height --      Head Circumference --      Peak Flow --      Pain Score 02/12/16 1626 8     Pain Loc --      Pain Edu? --      Excl. in [place]? --     Constitutional: Alert and oriented. Well appearing and in no acute distress. Patient is talkative and answers questions appropriately. Eyes: Conjunctivae are normal. PERRL. EOMI. Head: Atraumatic. Nose: Tender to palpation. There is evidence of epistaxis that is now resolved bilaterally. No congestion at this time. Mouth/Throat: Mucous membranes are moist.  Oropharynx non-erythematous.  There is a 2.5 cm flap laceration on the inner lower lip without active bleeding. Edges are irregular. There is no foreign bodies noted on exam. No obvious dental fracture is noted. Nontender to palpation. There is a 0.5 cm laceration on the lower lip exteriorly without active bleeding. Neck: No stridor.   Cardiovascular: Normal rate, regular rhythm. Grossly normal heart sounds.  Good peripheral circulation. Respiratory: Normal respiratory effort.  No retractions. Lungs CTAB. No tenderness on palpation of the chest wall. Gastrointestinal: Soft and nontender. No distention.  Musculoskeletal: Patient is able move upper and lower extremities without any difficulty. Patient has superficial abrasions noted to anterior knees without active bleeding. No foreign body was noted. No effusion is present and patient range of motion is nonrestricted. Patient does walk with the use of a cane. Neurologic:  Normal speech and language. No gross focal neurologic deficits are appreciated. No gait instability. Skin:  Skin is warm, dry and intact. Superficial abrasion noted to the right forehead without active bleeding. No foreign body noted. All other abrasions as noted above. Laceration to lower lip as noted above. Psychiatric: Mood and affect are normal. Speech and behavior are normal.  ____________________________________________   LABS (all labs ordered are  listed, but only abnormal results are displayed)  Labs Reviewed  GLUCOSE, CAPILLARY - Abnormal; Notable for the following:       Result Value   Glucose-Capillary 196 (*)  All other components within normal limits  COMPREHENSIVE METABOLIC PANEL - Abnormal; Notable for the following:    Sodium 132 (*)    Chloride 99 (*)    Glucose, Bld 193 (*)    BUN 21 (*)    Creatinine, Ser 1.34 (*)    Calcium 8.7 (*)    ALT 12 (*)    GFR calc non Af Amer 54 (*)    All other components within normal limits  CBC WITH DIFFERENTIAL/PLATELET - Abnormal; Notable for the following:    Hemoglobin 12.5 (*)    HCT 37.9 (*)    MCV 78.3 (*)    RDW 16.3 (*)    All other components within normal limits  CBG MONITORING, ED    RADIOLOGY  CT head and maxillofacial without contrast per radiologist: IMPRESSION:  1. Lower lip soft tissue laceration and hematoma. No underlying  fracture or acute dental injury identified.  2. No acute intracranial abnormality. Chronic left corona radiata  and basal ganglia lacunar infarct.  3. Carious right maxillary wisdom tooth.      ____________________________________________   PROCEDURES  Procedure(s) performed: LACERATION REPAIR Performed by: Johnn Hai Authorized by: Johnn Hai Consent: Verbal consent obtained. Risks and benefits: risks, benefits and alternatives were discussed Consent given by: patient Patient identity confirmed: provided demographic data Prepped and Draped in normal sterile fashion Wound explored  Laceration Location: Lower lip, anterior and exterior  Laceration Length: 3.0 cm total  No Foreign Bodies seen or palpated  Anesthesia: local infiltration  Local anesthetic: lidocaine 1 % with epinephrine  Anesthetic total: 2 ml  Irrigation method: syringe Amount of cleaning: standard  Skin closure: 5-0 Vicryl   Number of sutures: 4   Technique: Simple interrupted sutures to approximate edges.   Patient tolerance:  Patient tolerated the procedure well with no immediate complications.  Procedures  Critical Care performed: No  ____________________________________________   INITIAL IMPRESSION / ASSESSMENT AND PLAN / ED COURSE  Pertinent labs & imaging results that were available during my care of the patient were reviewed by me and considered in my medical decision making (see chart for details).    Clinical Course   Patient was placed on Biaxin as he has taken this before without any complications. Patient was also told to take mild soap and water to clean the exterior portion of his lip and watch for signs of infection. He is to also use warm salt water to swish after each meal. He is to follow-up with his primary care doctor if any continued problems. He was made aware that his CT scan did not show fractured nose or facial bones. He was also reassured that his CT did not show any intracranial abnormality resulting from his fall. Patient currently has pain medication at home and will continue taking as he has been instructed.  ____________________________________________   FINAL CLINICAL IMPRESSION(S) / ED DIAGNOSES  Final diagnoses:  Nosebleed  Laceration  Pain  Laceration of lower lip, initial encounter  Facial contusion, initial encounter  Abrasion of forehead, initial encounter  Acute anterior epistaxis  Abrasion of right knee, initial encounter      NEW MEDICATIONS STARTED DURING THIS VISIT:  New Prescriptions   CLARITHROMYCIN (BIAXIN) 500 MG TABLET    Take 1 tablet (500 mg total) by mouth 2 (two) times daily.     Note:  This document was prepared using Dragon voice recognition software and may include unintentional dictation errors.    Johnn Hai, PA-C 02/12/16  1958    Orbie Pyo, MD 02/12/16 [id number]

## 2016-02-16 ENCOUNTER — Other Ambulatory Visit: Payer: Self-pay | Admitting: *Deleted

## 2016-02-16 NOTE — Patient Outreach (Signed)
[facility]) Care Management  02/16/2016  Victor Wise 1951/01/29 [id number]   Phone call from patient stating that he had moved into his new apartment last Friday.  He could not remember his address, stating that it was written down but he could not find the paper it was written on.  Patient stated that he had fallen on 02/12/16 bringing things into his new apartment and bit his lip requiring and ED visit and sutures. Per patient, there were no other injuries.   Plan:  Home visit previously scheduled for 02/26/16 to be re-scheduled as patient has another medical appointment that day.  This social worker will call patient back next week to re-schedule.    [name] [facility] Care Management [phone]

## 2016-02-18 ENCOUNTER — Inpatient Hospital Stay: Payer: Commercial Managed Care - HMO

## 2016-02-18 ENCOUNTER — Inpatient Hospital Stay
Admission: EM | Admit: 2016-02-18 | Discharge: 2016-02-21 | DRG: 684 | Disposition: A | Discharge status: Home Health | Payer: Commercial Managed Care - HMO | Attending: Internal Medicine | Admitting: Internal Medicine

## 2016-02-18 ENCOUNTER — Encounter: Payer: Self-pay | Admitting: Emergency Medicine

## 2016-02-18 ENCOUNTER — Emergency Department: Payer: Commercial Managed Care - HMO

## 2016-02-18 DIAGNOSIS — N170 Acute kidney failure with tubular necrosis: Principal | ICD-10-CM | POA: Diagnosis present

## 2016-02-18 DIAGNOSIS — F419 Anxiety disorder, unspecified: Secondary | ICD-10-CM | POA: Diagnosis present

## 2016-02-18 DIAGNOSIS — R81 Glycosuria: Secondary | ICD-10-CM | POA: Diagnosis present

## 2016-02-18 DIAGNOSIS — Z7951 Long term (current) use of inhaled steroids: Secondary | ICD-10-CM

## 2016-02-18 DIAGNOSIS — C44319 Basal cell carcinoma of skin of other parts of face: Secondary | ICD-10-CM | POA: Diagnosis not present

## 2016-02-18 DIAGNOSIS — Z87891 Personal history of nicotine dependence: Secondary | ICD-10-CM

## 2016-02-18 DIAGNOSIS — Z8673 Personal history of transient ischemic attack (TIA), and cerebral infarction without residual deficits: Secondary | ICD-10-CM

## 2016-02-18 DIAGNOSIS — M549 Dorsalgia, unspecified: Secondary | ICD-10-CM | POA: Diagnosis present

## 2016-02-18 DIAGNOSIS — Z955 Presence of coronary angioplasty implant and graft: Secondary | ICD-10-CM | POA: Diagnosis not present

## 2016-02-18 DIAGNOSIS — Z23 Encounter for immunization: Secondary | ICD-10-CM

## 2016-02-18 DIAGNOSIS — R0781 Pleurodynia: Secondary | ICD-10-CM | POA: Diagnosis present

## 2016-02-18 DIAGNOSIS — I129 Hypertensive chronic kidney disease with stage 1 through stage 4 chronic kidney disease, or unspecified chronic kidney disease: Secondary | ICD-10-CM | POA: Diagnosis not present

## 2016-02-18 DIAGNOSIS — K219 Gastro-esophageal reflux disease without esophagitis: Secondary | ICD-10-CM | POA: Diagnosis present

## 2016-02-18 DIAGNOSIS — N179 Acute kidney failure, unspecified: Secondary | ICD-10-CM | POA: Diagnosis not present

## 2016-02-18 DIAGNOSIS — Z87442 Personal history of urinary calculi: Secondary | ICD-10-CM

## 2016-02-18 DIAGNOSIS — R296 Repeated falls: Secondary | ICD-10-CM | POA: Diagnosis present

## 2016-02-18 DIAGNOSIS — F329 Major depressive disorder, single episode, unspecified: Secondary | ICD-10-CM | POA: Diagnosis not present

## 2016-02-18 DIAGNOSIS — R7989 Other specified abnormal findings of blood chemistry: Secondary | ICD-10-CM | POA: Diagnosis present

## 2016-02-18 DIAGNOSIS — Z9181 History of falling: Secondary | ICD-10-CM | POA: Diagnosis not present

## 2016-02-18 DIAGNOSIS — S299XXA Unspecified injury of thorax, initial encounter: Secondary | ICD-10-CM | POA: Diagnosis not present

## 2016-02-18 DIAGNOSIS — I639 Cerebral infarction, unspecified: Secondary | ICD-10-CM | POA: Diagnosis not present

## 2016-02-18 DIAGNOSIS — G4733 Obstructive sleep apnea (adult) (pediatric): Secondary | ICD-10-CM | POA: Diagnosis present

## 2016-02-18 DIAGNOSIS — R109 Unspecified abdominal pain: Secondary | ICD-10-CM

## 2016-02-18 DIAGNOSIS — Z794 Long term (current) use of insulin: Secondary | ICD-10-CM

## 2016-02-18 DIAGNOSIS — I959 Hypotension, unspecified: Secondary | ICD-10-CM

## 2016-02-18 DIAGNOSIS — Z5189 Encounter for other specified aftercare: Secondary | ICD-10-CM

## 2016-02-18 DIAGNOSIS — E1142 Type 2 diabetes mellitus with diabetic polyneuropathy: Secondary | ICD-10-CM | POA: Diagnosis not present

## 2016-02-18 DIAGNOSIS — Z7982 Long term (current) use of aspirin: Secondary | ICD-10-CM

## 2016-02-18 DIAGNOSIS — L0100 Impetigo, unspecified: Secondary | ICD-10-CM | POA: Diagnosis not present

## 2016-02-18 DIAGNOSIS — Z950 Presence of cardiac pacemaker: Secondary | ICD-10-CM

## 2016-02-18 DIAGNOSIS — E1129 Type 2 diabetes mellitus with other diabetic kidney complication: Secondary | ICD-10-CM | POA: Diagnosis not present

## 2016-02-18 DIAGNOSIS — E1122 Type 2 diabetes mellitus with diabetic chronic kidney disease: Secondary | ICD-10-CM | POA: Diagnosis present

## 2016-02-18 DIAGNOSIS — G8929 Other chronic pain: Secondary | ICD-10-CM | POA: Diagnosis present

## 2016-02-18 DIAGNOSIS — R262 Difficulty in walking, not elsewhere classified: Secondary | ICD-10-CM

## 2016-02-18 DIAGNOSIS — Z9109 Other allergy status, other than to drugs and biological substances: Secondary | ICD-10-CM

## 2016-02-18 DIAGNOSIS — N400 Enlarged prostate without lower urinary tract symptoms: Secondary | ICD-10-CM | POA: Diagnosis present

## 2016-02-18 DIAGNOSIS — I10 Essential (primary) hypertension: Secondary | ICD-10-CM | POA: Diagnosis not present

## 2016-02-18 DIAGNOSIS — Z79899 Other long term (current) drug therapy: Secondary | ICD-10-CM

## 2016-02-18 DIAGNOSIS — Z87440 Personal history of urinary (tract) infections: Secondary | ICD-10-CM | POA: Diagnosis not present

## 2016-02-18 DIAGNOSIS — Z4800 Encounter for change or removal of nonsurgical wound dressing: Secondary | ICD-10-CM | POA: Diagnosis not present

## 2016-02-18 DIAGNOSIS — I951 Orthostatic hypotension: Secondary | ICD-10-CM | POA: Diagnosis present

## 2016-02-18 DIAGNOSIS — I2510 Atherosclerotic heart disease of native coronary artery without angina pectoris: Secondary | ICD-10-CM | POA: Diagnosis present

## 2016-02-18 DIAGNOSIS — Z841 Family history of disorders of kidney and ureter: Secondary | ICD-10-CM

## 2016-02-18 DIAGNOSIS — Z888 Allergy status to other drugs, medicaments and biological substances status: Secondary | ICD-10-CM

## 2016-02-18 DIAGNOSIS — N182 Chronic kidney disease, stage 2 (mild): Secondary | ICD-10-CM | POA: Diagnosis present

## 2016-02-18 DIAGNOSIS — K429 Umbilical hernia without obstruction or gangrene: Secondary | ICD-10-CM | POA: Diagnosis not present

## 2016-02-18 DIAGNOSIS — G464 Cerebellar stroke syndrome: Secondary | ICD-10-CM | POA: Diagnosis not present

## 2016-02-18 DIAGNOSIS — M6281 Muscle weakness (generalized): Secondary | ICD-10-CM

## 2016-02-18 DIAGNOSIS — K130 Diseases of lips: Secondary | ICD-10-CM | POA: Diagnosis not present

## 2016-02-18 DIAGNOSIS — Z886 Allergy status to analgesic agent status: Secondary | ICD-10-CM

## 2016-02-18 DIAGNOSIS — Z882 Allergy status to sulfonamides status: Secondary | ICD-10-CM

## 2016-02-18 DIAGNOSIS — Z8249 Family history of ischemic heart disease and other diseases of the circulatory system: Secondary | ICD-10-CM

## 2016-02-18 DIAGNOSIS — Z7902 Long term (current) use of antithrombotics/antiplatelets: Secondary | ICD-10-CM

## 2016-02-18 DIAGNOSIS — R531 Weakness: Secondary | ICD-10-CM | POA: Diagnosis not present

## 2016-02-18 DIAGNOSIS — Z880 Allergy status to penicillin: Secondary | ICD-10-CM

## 2016-02-18 LAB — CBC WITH DIFFERENTIAL/PLATELET
BASOS ABS: 0 10*3/uL (ref 0–0.1)
BASOS PCT: 0 %
Eosinophils Absolute: 0.3 10*3/uL (ref 0–0.7)
Eosinophils Relative: 3 %
HEMATOCRIT: 40.4 % (ref 40.0–52.0)
HEMOGLOBIN: 13.3 g/dL (ref 13.0–18.0)
Lymphocytes Relative: 36 %
Lymphs Abs: 3.4 10*3/uL (ref 1.0–3.6)
MCH: 26.6 pg (ref 26.0–34.0)
MCHC: 32.9 g/dL (ref 32.0–36.0)
MCV: 80.7 fL (ref 80.0–100.0)
MONOS PCT: 7 %
Monocytes Absolute: 0.7 10*3/uL (ref 0.2–1.0)
NEUTROS ABS: 5.1 10*3/uL (ref 1.4–6.5)
NEUTROS PCT: 54 %
Platelets: 293 10*3/uL (ref 150–440)
RBC: 5.01 MIL/uL (ref 4.40–5.90)
RDW: 16.6 % — ABNORMAL HIGH (ref 11.5–14.5)
WBC: 9.5 10*3/uL (ref 3.8–10.6)

## 2016-02-18 LAB — COMPREHENSIVE METABOLIC PANEL
ALBUMIN: 4.2 g/dL (ref 3.5–5.0)
ALK PHOS: 57 U/L (ref 38–126)
ALT: 15 U/L — AB (ref 17–63)
AST: 29 U/L (ref 15–41)
Anion gap: 11 (ref 5–15)
BILIRUBIN TOTAL: 1.2 mg/dL (ref 0.3–1.2)
BUN: 26 mg/dL — AB (ref 6–20)
CALCIUM: 9.2 mg/dL (ref 8.9–10.3)
CO2: 27 mmol/L (ref 22–32)
CREATININE: 2.38 mg/dL — AB (ref 0.61–1.24)
Chloride: 100 mmol/L — ABNORMAL LOW (ref 101–111)
GFR calc Af Amer: 31 mL/min — ABNORMAL LOW (ref >60)
GFR calc non Af Amer: 27 mL/min — ABNORMAL LOW (ref >60)
GLUCOSE: 143 mg/dL — AB (ref 65–99)
Potassium: 4.8 mmol/L (ref 3.5–5.1)
SODIUM: 138 mmol/L (ref 135–145)
TOTAL PROTEIN: 7.4 g/dL (ref 6.5–8.1)

## 2016-02-18 LAB — GLUCOSE, CAPILLARY
GLUCOSE-CAPILLARY: 142 mg/dL — AB (ref 65–99)
Glucose-Capillary: 148 mg/dL — ABNORMAL HIGH (ref 65–99)
Glucose-Capillary: 148 mg/dL — ABNORMAL HIGH (ref 65–99)

## 2016-02-18 LAB — TROPONIN I
Troponin I: <0.03 ng/mL (ref <0.03)
Troponin I: <0.03 ng/mL (ref <0.03)

## 2016-02-18 MED ORDER — ACETAMINOPHEN 325 MG PO TABS: 650.0000 mg | ORAL_TABLET | Freq: Four times a day (QID) | ORAL | Status: DC | PRN

## 2016-02-18 MED ORDER — SODIUM CHLORIDE 0.9 % IV SOLN
INTRAVENOUS | Status: DC
  Administered 2016-02-18 – 2016-02-20 (×4): via INTRAVENOUS

## 2016-02-18 MED ORDER — ENOXAPARIN SODIUM 40 MG/0.4ML ~~LOC~~ SOLN
40.0000 mg | SUBCUTANEOUS | Status: DC
  Administered 2016-02-18 – 2016-02-20 (×3): 40 mg via SUBCUTANEOUS
  Filled 2016-02-18 (×3): qty 0.4

## 2016-02-18 MED ORDER — MUPIROCIN CALCIUM 2 % EX CREA
TOPICAL_CREAM | Freq: Two times a day (BID) | CUTANEOUS | Status: DC
  Administered 2016-02-18 – 2016-02-21 (×5): via TOPICAL
  Filled 2016-02-18: qty 15

## 2016-02-18 MED ORDER — INFLUENZA VAC SPLIT QUAD 0.5 ML IM SUSY
0.5000 mL | PREFILLED_SYRINGE | INTRAMUSCULAR | Status: AC
  Administered 2016-02-19: 0.5 mL via INTRAMUSCULAR
  Filled 2016-02-18: qty 0.5

## 2016-02-18 MED ORDER — SENNA 8.6 MG PO TABS
1.0000 | ORAL_TABLET | Freq: Two times a day (BID) | ORAL | Status: DC
  Administered 2016-02-18 – 2016-02-21 (×6): 8.6 mg via ORAL
  Filled 2016-02-18 (×6): qty 1

## 2016-02-18 MED ORDER — DOCUSATE SODIUM 100 MG PO CAPS
100.0000 mg | ORAL_CAPSULE | Freq: Two times a day (BID) | ORAL | Status: DC
  Administered 2016-02-18 – 2016-02-21 (×6): 100 mg via ORAL
  Filled 2016-02-18 (×6): qty 1

## 2016-02-18 MED ORDER — SODIUM CHLORIDE 0.9% FLUSH
3.0000 mL | Freq: Two times a day (BID) | INTRAVENOUS | Status: DC
  Administered 2016-02-18 – 2016-02-21 (×4): 3 mL via INTRAVENOUS

## 2016-02-18 MED ORDER — INSULIN ASPART 100 UNIT/ML ~~LOC~~ SOLN
0.0000 [IU] | Freq: Three times a day (TID) | SUBCUTANEOUS | Status: DC
  Administered 2016-02-18: 1 [IU] via SUBCUTANEOUS
  Administered 2016-02-19 (×2): 3 [IU] via SUBCUTANEOUS
  Administered 2016-02-19 – 2016-02-20 (×3): 2 [IU] via SUBCUTANEOUS
  Administered 2016-02-20: 3 [IU] via SUBCUTANEOUS
  Administered 2016-02-21 (×2): 2 [IU] via SUBCUTANEOUS
  Filled 2016-02-18 (×2): qty 2
  Filled 2016-02-18 (×3): qty 3
  Filled 2016-02-18 (×3): qty 2
  Filled 2016-02-18: qty 1

## 2016-02-18 MED ORDER — ACETAMINOPHEN 325 MG RE SUPP
650.0000 mg | Freq: Four times a day (QID) | RECTAL | Status: DC | PRN
Start: 2016-02-18 — End: 2016-02-21
  Filled 2016-02-18: qty 2

## 2016-02-18 MED ORDER — ONDANSETRON HCL 4 MG/2ML IJ SOLN: 4.0000 mg | Freq: Four times a day (QID) | INTRAMUSCULAR | Status: DC | PRN

## 2016-02-18 MED ORDER — ONDANSETRON HCL 4 MG PO TABS: 4.0000 mg | ORAL_TABLET | Freq: Four times a day (QID) | ORAL | Status: DC | PRN

## 2016-02-18 MED ORDER — INSULIN ASPART 100 UNIT/ML ~~LOC~~ SOLN: 0.0000 [IU] | Freq: Every day | SUBCUTANEOUS | Status: DC

## 2016-02-18 NOTE — ED Triage Notes (Signed)
Pt presents to ED with reports of noticing the sutures in his lower lip coming out. Pt states was seen last Friday here and had the sutures placed. Pt denies pain or drainage from site.

## 2016-02-18 NOTE — H&P (Signed)
[facility] NAME: Victor Wise    MR#:  [id number]  DATE OF BIRTH:  Nov 08, 1950  DATE OF ADMISSION:  02/18/2016  PRIMARY CARE PHYSICIAN: Lavera Guise, MD   REQUESTING/REFERRING PHYSICIAN:   CHIEF COMPLAINT:   Chief Complaint  Patient presents with  . Wound Check    HISTORY OF PRESENT ILLNESS: Victor Wise  is a 65 y.o. male with a known history of Multiple medical problems, including chronic back pain, status post intrathecal stimulator placement, permanent pacemaker placement, diabetes mellitus, obesity, gastroesophageal reflux disease, coronary artery disease, morbid obesity, obstructive sleep apnea, stroke, kidney stones, having passed 6. Kidney stones in his lifetime, who presents to the hospital with complaints of right flank pain. Apparently patient had a fall last Friday, about 6 days ago, he was seen in the emergency room due to injured lower lip, treated with antibiotic therapy, sent home, however, upon arrival back home. He felt that his right side of the trunk is hurting. Pain is described as sharp, intermittent pain, somewhat improving whenever he lays down on the left side, becoming worse. They decided to come to emergency room for further evaluation and treatment. While in the emergency room today. He became very weak while he was an x-ray complained of right side pain. He almost passed out. Labs were performed and he was found to have acute on chronic renal failure. Creatinine level of 2.38, up from 1.34, few days ago. Hospitalist services were contacted for admission. Urinalysis was not performed. The patient was treated for urinary tract infection with antibiotic therapy about a week ago due to symptoms of dysuria, which resolved, however, patient continues to have increased frequency of urination.  PAST MEDICAL HISTORY:   Past Medical History:  Diagnosis Date  . Allergy   . Basal cell carcinoma    forehead  . BPH  (benign prostatic hyperplasia)   . Chronic back pain   . Depression   . Diabetes ([place])   . GERD (gastroesophageal reflux disease)   . Heart attack   . Hypertension   . Morbid obesity ([place])   . Obstructive sleep apnea   . 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 Status post insertion of spinal cord stimulator   . Stroke ([place])   . UTI (lower urinary tract infection)     PAST SURGICAL HISTORY: Past Surgical History:  Procedure Laterality Date  . CARDIAC CATHETERIZATION N/A 12/19/2014   Procedure: Coronary Stent Intervention;  Surgeon: Charolette Forward, MD;  Location: [facility];  Service: Cardiovascular;  Laterality: N/A;  . CARDIAC CATHETERIZATION Left 12/19/2014   Procedure: Left Heart Cath and Coronary Angiography;  Surgeon: Dionisio Jhordan, MD;  Location: [facility];  Service: Cardiovascular;  Laterality: Left;  . PACEMAKER INSERTION Left 11/02/2015   Procedure: INSERTION PACEMAKER;  Surgeon: Isaias Cowman, MD;  Location: [facility];  Service: Cardiovascular;  Laterality: Left;  . Pain Stimulator    . Right toe amputation      SOCIAL HISTORY:  Social History  Substance Use Topics  . Smoking status: Former Smoker    Types: Cigarettes  . Smokeless tobacco: Never Used     Comment: quit 45 years  . Alcohol use 0.0 oz/week     Comment: occasionally    FAMILY HISTORY:  Family History  Problem Relation Age of Onset  . Breast cancer Mother   . Cancer Mother   . Hypertension Mother   . Lung cancer  Father   . Hypertension Father   . Heart disease Father   . Cancer Father   . Kidney disease Sister   . Prostate cancer Neg Hx     DRUG ALLERGIES:  Allergies  Allergen Reactions  . Amoxicillin Shortness Of Breath, Itching and Other (See Comments)    Has patient had a PCN reaction causing immediate rash, facial/tongue/throat swelling, SOB or lightheadedness with hypotension: Yes Has patient had a PCN reaction causing severe rash involving mucus membranes or skin necrosis:  No Has patient had a PCN reaction that required hospitalization No Has patient had a PCN reaction occurring within the last 10 years: No If all of the above answers are "NO", then may proceed with Cephalosporin use.  . Other Anaphylaxis, Itching and Other (See Comments)    Pt states that he is allergic to Endopa.  Reaction:  Anaphylaxis  Pt states that he is allergic to Metabisulfites Reaction:  Itching   . Penicillins Shortness Of Breath, Itching and Other (See Comments)    Has patient had a PCN reaction causing immediate rash, facial/tongue/throat swelling, SOB or lightheadedness with hypotension: Yes Has patient had a PCN reaction causing severe rash involving mucus membranes or skin necrosis: No Has patient had a PCN reaction that required hospitalization No Has patient had a PCN reaction occurring within the last 10 years: No If all of the above answers are "NO", then may proceed with Cephalosporin use.  [name] Hydralazine Other (See Comments)    Reaction:  Cramping of extremities  . Crestor [Rosuvastatin Calcium] Other (See Comments)    Reaction:  Pt is unable to move arms/legs   . Metoprolol Nausea And Vomiting  . Statins Other (See Comments)    Reaction:  Pt is unable to move arms/legs  . Sulfa Antibiotics Itching  . Yellow Dyes (Non-Tartrazine) Itching  . Aspirin Itching and Other (See Comments)    Pt states that he is able to use in lower doses.    . Tape Rash    Review of Systems  Constitutional: Positive for weight loss. Negative for chills and fever.  HENT: Negative for congestion.   Eyes: Positive for blurred vision. Negative for double vision.  Respiratory: Negative for cough, sputum production, shortness of breath and wheezing.   Cardiovascular: Negative for chest pain, palpitations, orthopnea, leg swelling and PND.  Gastrointestinal: Positive for constipation. Negative for abdominal pain, blood in stool, diarrhea, nausea and vomiting.  Genitourinary: Positive for  dysuria, flank pain and frequency. Negative for hematuria and urgency.  Musculoskeletal: Positive for back pain and falls.  Neurological: Positive for dizziness and weakness. Negative for tremors, focal weakness and headaches.  Endo/Heme/Allergies: Does not bruise/bleed easily.  Psychiatric/Behavioral: Negative for depression. The patient does not have insomnia.     MEDICATIONS AT HOME:  Prior to Admission medications   Medication Sig Start Date End Date Taking? Authorizing Provider  acetaminophen (TYLENOL) 325 MG tablet Take 2 tablets (650 mg total) by mouth every 6 (six) hours as needed for mild pain. Patient not taking: Reported on 01/28/2016 11/03/15   Dustin Flock, MD  amLODipine (NORVASC) 10 MG tablet Take 10 mg by mouth daily.    Historical Provider, MD  aspirin EC 81 MG tablet Take 81 mg by mouth daily.    Historical Provider, MD  benazepril (LOTENSIN) 40 MG tablet Take 40 mg by mouth 2 (two) times daily.     Historical Provider, MD  buPROPion (WELLBUTRIN XL) 150 MG 24 hr tablet Take 300 mg by  mouth daily.     Historical Provider, MD  busPIRone (BUSPAR) 10 MG tablet Take 10 mg by mouth at bedtime.    Historical Provider, MD  cetirizine (ZYRTEC) 10 MG tablet Take 10 mg by mouth at bedtime as needed for allergies.     Historical Provider, MD  clarithromycin (BIAXIN) 500 MG tablet Take 1 tablet (500 mg total) by mouth 2 (two) times daily. 02/12/16   Johnn Hai, PA-C  clopidogrel (PLAVIX) 75 MG tablet Take 1 tablet (75 mg total) by mouth daily. 10/21/15   Nicholes Mango, MD  DULoxetine (CYMBALTA) 30 MG capsule Take 30-60 mg by mouth 2 (two) times daily. Pt takes two capsules in the morning and one at night.    Historical Provider, MD  Exenatide ER (BYDUREON) 2 MG PEN Inject 2 mg into the skin once a week. Pt uses on Sunday.    Historical Provider, MD  fenofibrate 160 MG tablet Take 1 tablet (160 mg total) by mouth every evening. 10/21/15   Nicholes Mango, MD  fluticasone (FLONASE) 50 MCG/ACT  nasal spray Place 2 sprays into both nostrils daily.    Historical Provider, MD  hydrochlorothiazide (MICROZIDE) 12.5 MG capsule Take 12.5 mg by mouth daily.    Historical Provider, MD  insulin aspart (NOVOLOG) 100 UNIT/ML injection Inject 5 Units into the skin 3 (three) times daily with meals. 11/03/15   Dustin Flock, MD  insulin glargine (LANTUS) 100 UNIT/ML injection Inject 0.4 mLs (40 Units total) into the skin at bedtime. 11/03/15   Dustin Flock, MD  metaxalone (SKELAXIN) 800 MG tablet Take 400-800 mg by mouth 3 (three) times daily as needed for muscle spasms.    Historical Provider, MD  NARCAN 4 MG/0.1ML LIQD Take 4 mg by mouth as needed (for opioid overdose).     Historical Provider, MD  nitroGLYCERIN (NITROSTAT) 0.4 MG SL tablet Place 1 tablet (0.4 mg total) under the tongue every 5 (five) minutes x 3 doses as needed for chest pain. 12/21/14   Dixie Dials, MD  omega-3 acid ethyl esters (LOVAZA) 1 g capsule Take 1 capsule (1 g total) by mouth 2 (two) times daily. 10/21/15   Nicholes Mango, MD  Oxcarbazepine (TRILEPTAL) 300 MG tablet Take 300-600 mg by mouth 2 (two) times daily. Pt takes one tablet in the morning and two at night.    Historical Provider, MD  oxyCODONE (OXY IR/ROXICODONE) 5 MG immediate release tablet Take 1 tablet (5 mg total) by mouth 3 (three) times daily as needed for severe pain. Patient not taking: Reported on 01/28/2016 11/03/15   Dustin Flock, MD  oxyCODONE (OXYCONTIN) 20 mg 12 hr tablet Take 1 tablet (20 mg total) by mouth every 12 (twelve) hours. 11/03/15   Dustin Flock, MD  pantoprazole (PROTONIX) 40 MG tablet Take 40 mg by mouth daily.    Historical Provider, MD  pregabalin (LYRICA) 200 MG capsule Take 200-400 mg by mouth 2 (two) times daily. Pt takes two capsules in the morning and one at night.    Historical Provider, MD  tamsulosin (FLOMAX) 0.4 MG CAPS capsule Take 0.4 mg by mouth daily after supper.     Historical Provider, MD      PHYSICAL EXAMINATION:   VITAL  SIGNS: Blood pressure (!) 117/58, pulse 63, temperature 97.7 F (36.5 C), temperature source Oral, resp. rate 18, height 5' 8.75" (1.746 m), weight 122.5 kg (270 lb), SpO2 95 %.  GENERAL:  65 y.o.-year-old patient lying in the bedIn moderate  distress ue to pain  in the back as well as right flank, cannot move it in the bed freely. Barely sits up in the bed due to significant pain EYES: Pupils equal, round, reactive to light and accommodation. No scleral icterus. Extraocular muscles intact.  HEENT: Head atraumatic, normocephalic. Oropharynx and nasopharynx clear.  NECK:  Supple, no jugular venous distention. No thyroid enlargement, no tenderness.  LUNGS: Normal breath sounds bilaterally, no wheezing, rales,rhonchi or crepitation. No use of accessory muscles of respiration.  CARDIOVASCULAR: S1, S2 normal. No murmurs, rubs, or gallops.  ABDOMEN: Soft, tender in the right upper quadrant and right mid axillary line, nondistended. Bowel sounds present. No organomegaly or mass. No CVA tenderness on percussion on the right. On the left lower lip has EXTREMITIES: No pedal edema, cyanosis, or clubbing.  NEUROLOGIC: Cranial nerves II through XII are intact. Muscle strength 5/5 in all extremities. Sensation intact. Gait not checked.  PSYCHIATRIC: The patient is alert and oriented x 3.  SKIN: No obvious rash, lesion, or ulcer. Lower lip lesion with purulent discharge and scaling was noted  LABORATORY PANEL:   CBC  Recent Labs Lab 02/12/16 1705 02/18/16 1149  WBC 6.6 9.5  HGB 12.5* 13.3  HCT 37.9* 40.4  PLT 220 293  MCV 78.3* 80.7  MCH 26.0 26.6  MCHC 33.1 32.9  RDW 16.3* 16.6*  LYMPHSABS 1.2 3.4  MONOABS 0.5 0.7  EOSABS 0.1 0.3  BASOSABS 0.1 0.0   ------------------------------------------------------------------------------------------------------------------  Chemistries   Recent Labs Lab 02/18/16 1149  NA 138  K 4.8  CL 100*  CO2 27  GLUCOSE 143*  BUN 26*  CREATININE 2.38*   CALCIUM 9.2  AST 29  ALT 15*  ALKPHOS 57  BILITOT 1.2   ------------------------------------------------------------------------------------------------------------------  Cardiac Enzymes No results for input(s): TROPONINI in the last 168 hours. ------------------------------------------------------------------------------------------------------------------  RADIOLOGY: Dg Ribs Unilateral W/chest Right  Result Date: 02/18/2016 CLINICAL DATA:  Pt fell face first on Friday and is having right flank pain. Hx of hypertension, diabetes, pacemaker, spinal stimulator. Former smoker. BB marker placed in area of pain. EXAM: RIGHT RIBS AND CHEST - 3+ VIEW COMPARISON:  11/02/2015 FINDINGS: No convincing fracture. There is a small bony protrudes from the anterior right tenth rib end which appears chronic. Cardiac silhouette is mildly enlarged. No mediastinal or hilar masses. Clear lungs. No pleural effusion or pneumothorax. Left anterior chest wall sequential pacemaker is stable. IMPRESSION: 1. No acute rib fracture. 2. No active cardiopulmonary disease. Electronically Signed   By: Lajean Manes M.D.   On: 02/18/2016 13:34    EKG: Orders placed or performed during the hospital encounter of 10/26/15  . EKG 12-Lead  . EKG 12-Lead  . EKG 12-Lead in am (before 8am)  . EKG 12-Lead in am (before 8am)  . EKG    IMPRESSION AND PLAN:  Active Problems:   Acute renal failure (ARF) (HCC)   Hypotension  #1. Acute renal failure, hold nephrotoxic medications, including Ace inhibitors, continue patient on IV fluids, place Foley catheter to follow urinary output, get CT of abdomen and pelvis for renal protocol to rule out stones, hydronephrosis, follow creatinine in the morning, get urinalysis to rule out urinary tract infection #2. Hypertension, continue IV fluids, hold blood pressure medications #3. Lower lip present lesion, initiate patient on mupirocin #4. Diabetes mellitus 2, get hemoglobin A1c,  continue IV fluids, sliding scale insulin #5. Generalized weakness, get physical therapist for  recommendations  All the records are reviewed and case discussed with ED provider. Management plans discussed with the  patient, family and they are in agreement.  CODE STATUS: Code Status History    Date Active Date Inactive Code Status Order ID Comments User Context   10/26/2015  9:11 PM 11/03/2015  4:16 PM Full Code [id number]  Vaughan Basta, MD Inpatient   10/19/2015 10:00 PM 10/21/2015 11:51 PM Full Code [id number]  Gladstone Lighter, MD Inpatient   12/19/2014  9:31 PM 12/21/2014  5:10 PM Full Code [id number]  Charolette Forward, MD Inpatient   12/19/2014  9:23 PM 12/19/2014  9:31 PM Full Code [id number]  Charolette Forward, MD Inpatient   12/19/2014 12:38 PM 12/19/2014  5:18 PM Full Code [id number]  Dionisio Isaack, MD Inpatient   12/18/2014  6:36 PM 12/19/2014 12:38 PM Full Code [id number]  Henreitta Leber, MD Inpatient       TOTAL TIME TAKING CARE OF THIS PATIENT: 50 minutes.    Theodoro Grist M.D on 02/18/2016 at 4:13 PM  Between 7am to 6pm - Pager - [phone] After 6pm go to [email or web address] - password [email or web address] [facility]  Office  [phone]  CC: Primary care physician; Lavera Guise, MD

## 2016-02-18 NOTE — Progress Notes (Signed)
Day shift RN tried twice to place foley cath without success. Notified Dr. Edwina Barth. MD gave order to try and place a coude catheter.

## 2016-02-18 NOTE — ED Notes (Signed)
Called informed RN bed ready

## 2016-02-18 NOTE — Progress Notes (Signed)
Pt came to floor at 1600. VSS. Pt was oriented to room and safety plan. Phone in reach at beside and bed alarm is on.

## 2016-02-18 NOTE — ED Provider Notes (Signed)
[facility] Emergency Department Provider Note   ____________________________________________   First MD Initiated Contact with Patient 02/18/16 1049     (approximate)  I have reviewed the triage vital signs and the nursing notes.   HISTORY  Chief Complaint Wound Check   HPI Victor Wise is a 65 y.o. male is here for a recheck of his lower lip laceration. Patient was seen in the emergency room on 02/12/16 after he fell causing a laceration to his inner and outer lower lip. Patient is here because the "stitches are coming out". Patient denies any signs of infection or any drainage from the area. Patient does complain of some right lateral rib pain since he was seen in the emergency room that he did not have prior to his discharge. He is uncertain whether he had any bruising to that area but today continues to have pain in that same area. Pain is reproduced with movement and with palpation. Patient denies any difficulty breathing.He continues to take his regular medications without any difficulty. Patient was placed on Biaxin prophylactically last week as he knows that this is one of the medications that he can take without any difficulties.   Past Medical History:  Diagnosis Date  . Allergy   . Basal cell carcinoma    forehead  . BPH (benign prostatic hyperplasia)   . Chronic back pain   . Depression   . Diabetes ([place])   . GERD (gastroesophageal reflux disease)   . Heart attack   . Hypertension   . Morbid obesity ([place])   . Obstructive sleep apnea   . 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 Status post insertion of spinal cord stimulator   . Stroke ([place])   . UTI (lower urinary tract infection)     Patient Active Problem List   Diagnosis Date Noted  . Sick sinus syndrome ([place]) 10/28/2015  . Syncopal episodes 10/26/2015  . Syncope 10/26/2015  . Adjustment disorder with mixed anxiety and depressed mood 10/20/2015  . Dysthymia 10/20/2015  . CVA (cerebral  infarction) 10/19/2015  . Urinary retention 03/25/2015  . 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 Acute MI, anterolateral wall, subsequent episode of care ([place]) 12/19/2014  . SIRS (systemic inflammatory response syndrome) ([place]) 12/18/2014    Past Surgical History:  Procedure Laterality Date  . CARDIAC CATHETERIZATION N/A 12/19/2014   Procedure: Coronary Stent Intervention;  Surgeon: Charolette Forward, MD;  Location: [place] [facility];  Service: Cardiovascular;  Laterality: N/A;  . CARDIAC CATHETERIZATION Left 12/19/2014   Procedure: Left Heart Cath and Coronary Angiography;  Surgeon: Dionisio Murray, MD;  Location: [place] [facility];  Service: Cardiovascular;  Laterality: Left;  . PACEMAKER INSERTION Left 11/02/2015   Procedure: INSERTION PACEMAKER;  Surgeon: Isaias Cowman, MD;  Location: [facility];  Service: Cardiovascular;  Laterality: Left;  . Pain Stimulator    . Right toe amputation      Prior to Admission medications   Medication Sig Start Date End Date Taking? Authorizing Provider  acetaminophen (TYLENOL) 325 MG tablet Take 2 tablets (650 mg total) by mouth every 6 (six) hours as needed for mild pain. Patient not taking: Reported on 01/28/2016 11/03/15   Dustin Flock, MD  amLODipine (NORVASC) 10 MG tablet Take 10 mg by mouth daily.    Historical Provider, MD  aspirin EC 81 MG tablet Take 81 mg by mouth daily.    Historical Provider, MD  benazepril (LOTENSIN) 40 MG tablet Take 40 mg by mouth 2 (  two) times daily.     Historical Provider, MD  buPROPion (WELLBUTRIN XL) 150 MG 24 hr tablet Take 300 mg by mouth daily.     Historical Provider, MD  busPIRone (BUSPAR) 10 MG tablet Take 10 mg by mouth at bedtime.    Historical Provider, MD  cetirizine (ZYRTEC) 10 MG tablet Take 10 mg by mouth at bedtime as needed for allergies.     Historical Provider, MD  clarithromycin (BIAXIN) 500 MG tablet Take 1 tablet (500 mg total) by mouth 2 (two) times daily. 02/12/16   Johnn Hai, PA-C    clopidogrel (PLAVIX) 75 MG tablet Take 1 tablet (75 mg total) by mouth daily. 10/21/15   Nicholes Mango, MD  DULoxetine (CYMBALTA) 30 MG capsule Take 30-60 mg by mouth 2 (two) times daily. Pt takes two capsules in the morning and one at night.    Historical Provider, MD  Exenatide ER (BYDUREON) 2 MG PEN Inject 2 mg into the skin once a week. Pt uses on Sunday.    Historical Provider, MD  fenofibrate 160 MG tablet Take 1 tablet (160 mg total) by mouth every evening. 10/21/15   Nicholes Mango, MD  fluticasone (FLONASE) 50 MCG/ACT nasal spray Place 2 sprays into both nostrils daily.    Historical Provider, MD  hydrochlorothiazide (MICROZIDE) 12.5 MG capsule Take 12.5 mg by mouth daily.    Historical Provider, MD  insulin aspart (NOVOLOG) 100 UNIT/ML injection Inject 5 Units into the skin 3 (three) times daily with meals. 11/03/15   Dustin Flock, MD  insulin glargine (LANTUS) 100 UNIT/ML injection Inject 0.4 mLs (40 Units total) into the skin at bedtime. 11/03/15   Dustin Flock, MD  metaxalone (SKELAXIN) 800 MG tablet Take 400-800 mg by mouth 3 (three) times daily as needed for muscle spasms.    Historical Provider, MD  NARCAN 4 MG/0.1ML LIQD Take 4 mg by mouth as needed (for opioid overdose).     Historical Provider, MD  nitroGLYCERIN (NITROSTAT) 0.4 MG SL tablet Place 1 tablet (0.4 mg total) under the tongue every 5 (five) minutes x 3 doses as needed for chest pain. 12/21/14   Dixie Dials, MD  omega-3 acid ethyl esters (LOVAZA) 1 g capsule Take 1 capsule (1 g total) by mouth 2 (two) times daily. 10/21/15   Nicholes Mango, MD  Oxcarbazepine (TRILEPTAL) 300 MG tablet Take 300-600 mg by mouth 2 (two) times daily. Pt takes one tablet in the morning and two at night.    Historical Provider, MD  oxyCODONE (OXY IR/ROXICODONE) 5 MG immediate release tablet Take 1 tablet (5 mg total) by mouth 3 (three) times daily as needed for severe pain. Patient not taking: Reported on 01/28/2016 11/03/15   Dustin Flock, MD  oxyCODONE  (OXYCONTIN) 20 mg 12 hr tablet Take 1 tablet (20 mg total) by mouth every 12 (twelve) hours. 11/03/15   Dustin Flock, MD  pantoprazole (PROTONIX) 40 MG tablet Take 40 mg by mouth daily.    Historical Provider, MD  pregabalin (LYRICA) 200 MG capsule Take 200-400 mg by mouth 2 (two) times daily. Pt takes two capsules in the morning and one at night.    Historical Provider, MD  tamsulosin (FLOMAX) 0.4 MG CAPS capsule Take 0.4 mg by mouth daily after supper.     Historical Provider, MD    Allergies Amoxicillin; Other; Penicillins; Hydralazine; Crestor [rosuvastatin calcium]; Metoprolol; Statins; Sulfa antibiotics; Yellow dyes (non-tartrazine); Aspirin; and Tape  Family History  Problem Relation Age of Onset  . Breast  cancer Mother   . Cancer Mother   . Hypertension Mother   . Lung cancer Father   . Hypertension Father   . Heart disease Father   . Cancer Father   . Kidney disease Sister   . Prostate cancer Neg Hx     Social History Social History  Substance Use Topics  . Smoking status: Former Smoker    Types: Cigarettes  . Smokeless tobacco: Never Used     Comment: quit 45 years  . Alcohol use 0.0 oz/week     Comment: occasionally    Review of Systems Constitutional: No fever/chills Eyes: No visual changes. ENT: No sore throat. Cardiovascular: Denies chest pain. Respiratory: Denies shortness of breath. Gastrointestinal: No abdominal pain.  No nausea, no vomiting.   Genitourinary: Negative for dysuria. Musculoskeletal: Negative for back pain.Positive right lateral rib pain. Skin: Positive laceration lower lip. Neurological: Negative for headaches, focal weakness or numbness.  10-point ROS otherwise negative.  ____________________________________________   PHYSICAL EXAM:  VITAL SIGNS: ED Triage Vitals [02/18/16 1026]  Enc Vitals Group     BP (!) 90/50     Pulse Rate 62     Resp 18     Temp 97.9 F (36.6 C)     Temp Source Oral     SpO2 96 %     Weight 270 lb  (122.5 kg)     Height 5' 8.75" (1.746 m)     Head Circumference      Peak Flow      Pain Score 0     Pain Loc      Pain Edu?      Excl. in [place]?     Constitutional: Alert and oriented. Well appearing and in no acute distress. Eyes: Conjunctivae are normal. PERRL. EOMI. Head: Atraumatic. Nose: No congestion/rhinnorhea. Neck: No stridor.   Hematological/Lymphatic/Immunilogical: No cervical lymphadenopathy. Cardiovascular: Normal rate, regular rhythm. Grossly normal heart sounds.  Good peripheral circulation. Respiratory: Normal respiratory effort.  No retractions. Lungs CTAB. Gastrointestinal: Soft and nontender. Obese. Bowel sounds normoactive 4 quadrants. Musculoskeletal: Moves upper and lower extremities without difficulty. Patient does continue to walk with a cane. Neurologic:  Normal speech and language. No gross focal neurologic deficits are appreciated.  Skin:  Skin is warm, dry.  Inner lower lip with wound dehisced. No drainage from the area. No Vicryl sutures were seen. Laceration to the lower lip exteriorly appears to be healing without any signs of infection. Psychiatric: Mood and affect are normal. Speech and behavior are normal.  ____________________________________________   LABS (all labs ordered are listed, but only abnormal results are displayed)  Labs Reviewed  GLUCOSE, CAPILLARY - Abnormal; Notable for the following:       Result Value   Glucose-Capillary 148 (*)    All other components within normal limits  CBC WITH DIFFERENTIAL/PLATELET - Abnormal; Notable for the following:    RDW 16.6 (*)    All other components within normal limits  COMPREHENSIVE METABOLIC PANEL - Abnormal; Notable for the following:    Chloride 100 (*)    Glucose, Bld 143 (*)    BUN 26 (*)    Creatinine, Ser 2.38 (*)    ALT 15 (*)    GFR calc non Af Amer 27 (*)    GFR calc Af Amer 31 (*)    All other components within normal limits  CBG MONITORING, ED    ____________________________________________  EKG  Deferred ____________________________________________  RADIOLOGY Right rib x-ray per radiologist IMPRESSION:  1.  No acute rib fracture.  2. No active cardiopulmonary disease.       ____________________________________________   PROCEDURES  Procedure(s) performed: None  Procedures  Critical Care performed: No  ____________________________________________   INITIAL IMPRESSION / ASSESSMENT AND PLAN / ED COURSE  Pertinent labs & imaging results that were available during my care of the patient were reviewed by me and considered in my medical decision making (see chart for details).    Clinical Course   While in the emergency department patient went for x-ray of his right ribs and was brought back due to a "weak spell" where was reported that the patient could not stand. Glucose fingerstick was 148. Labs were drawn for baseline. Today with a GFR of 27. There were labs drawn on 9/29 for Dr. Laurelyn Sickle office that patient is not aware of the results. At that time creatinine was 1.34 and GFR was 54. Patient was agreeable to come into the emergency room and arrangements were made for admission.   ____________________________________________   FINAL CLINICAL IMPRESSION(S) / ED DIAGNOSES  Final diagnoses:  Acute renal failure, unspecified acute renal failure type [facility])  Visit for wound check      NEW MEDICATIONS STARTED DURING THIS VISIT:  New Prescriptions   No medications on file     Note:  This document was prepared using Dragon voice recognition software and may include unintentional dictation errors.    Johnn Hai, PA-C 02/18/16 Landis Quigley, MD 02/18/16 [phone]

## 2016-02-18 NOTE — Progress Notes (Signed)
Pt stood up to move to chair and had a small amount of blood drip from penis. Most likely from irritation from trying to place the catheters earlier. Will continue to monitor. Pt is currently up in the chair. Pt also refused to put on inpatient blue gown, wanted to keep the ER gown.

## 2016-02-19 LAB — CBC
HCT: 37 % — ABNORMAL LOW (ref 40.0–52.0)
Hemoglobin: 12.5 g/dL — ABNORMAL LOW (ref 13.0–18.0)
MCH: 26.8 pg (ref 26.0–34.0)
MCHC: 33.6 g/dL (ref 32.0–36.0)
MCV: 79.7 fL — AB (ref 80.0–100.0)
PLATELETS: 185 10*3/uL (ref 150–440)
RBC: 4.65 MIL/uL (ref 4.40–5.90)
RDW: 16.3 % — AB (ref 11.5–14.5)
WBC: 4.6 10*3/uL (ref 3.8–10.6)

## 2016-02-19 LAB — URINALYSIS COMPLETE WITH MICROSCOPIC (ARMC ONLY)
BILIRUBIN URINE: NEGATIVE
Bacteria, UA: NONE SEEN
Glucose, UA: 50 mg/dL — AB
Hgb urine dipstick: NEGATIVE
KETONES UR: NEGATIVE mg/dL
LEUKOCYTES UA: NEGATIVE
NITRITE: NEGATIVE
PH: 7 (ref 5.0–8.0)
Protein, ur: NEGATIVE mg/dL
SQUAMOUS EPITHELIAL / LPF: NONE SEEN
Specific Gravity, Urine: 1.006 (ref 1.005–1.030)

## 2016-02-19 LAB — BASIC METABOLIC PANEL
Anion gap: 8 (ref 5–15)
BUN: 21 mg/dL — AB (ref 6–20)
CHLORIDE: 103 mmol/L (ref 101–111)
CO2: 27 mmol/L (ref 22–32)
CREATININE: 1.64 mg/dL — AB (ref 0.61–1.24)
Calcium: 8.5 mg/dL — ABNORMAL LOW (ref 8.9–10.3)
GFR calc Af Amer: 49 mL/min — ABNORMAL LOW (ref >60)
GFR, EST NON AFRICAN AMERICAN: 43 mL/min — AB (ref >60)
GLUCOSE: 164 mg/dL — AB (ref 65–99)
POTASSIUM: 3.9 mmol/L (ref 3.5–5.1)
SODIUM: 138 mmol/L (ref 135–145)

## 2016-02-19 LAB — TROPONIN I

## 2016-02-19 LAB — GLUCOSE, CAPILLARY
GLUCOSE-CAPILLARY: 200 mg/dL — AB (ref 65–99)
GLUCOSE-CAPILLARY: 217 mg/dL — AB (ref 65–99)
GLUCOSE-CAPILLARY: 210 mg/dL — AB (ref 65–99)
GLUCOSE-CAPILLARY: 170 mg/dL — AB (ref 65–99)

## 2016-02-19 LAB — HEMOGLOBIN A1C
HEMOGLOBIN A1C: 6.9 % — AB (ref 4.8–5.6)
Mean Plasma Glucose: 151 mg/dL

## 2016-02-19 MED ORDER — CLINDAMYCIN PHOSPHATE 600 MG/50ML IV SOLN
600.0000 mg | Freq: Three times a day (TID) | INTRAVENOUS | Status: DC
  Administered 2016-02-19 – 2016-02-21 (×6): 600 mg via INTRAVENOUS
  Filled 2016-02-19 (×8): qty 50

## 2016-02-19 MED ORDER — OXCARBAZEPINE 300 MG PO TABS
600.0000 mg | ORAL_TABLET | Freq: Every day | ORAL | Status: DC
  Administered 2016-02-19 – 2016-02-20 (×2): 600 mg via ORAL
  Filled 2016-02-19 (×3): qty 2

## 2016-02-19 MED ORDER — FLUTICASONE PROPIONATE 50 MCG/ACT NA SUSP
2.0000 | Freq: Every day | NASAL | Status: DC
  Administered 2016-02-19 – 2016-02-21 (×3): 2 via NASAL
  Filled 2016-02-19: qty 16

## 2016-02-19 MED ORDER — FENOFIBRATE 160 MG PO TABS
160.0000 mg | ORAL_TABLET | Freq: Every evening | ORAL | Status: DC
  Administered 2016-02-19 – 2016-02-20 (×2): 160 mg via ORAL
  Filled 2016-02-19 (×2): qty 1

## 2016-02-19 MED ORDER — INSULIN GLARGINE 100 UNIT/ML ~~LOC~~ SOLN
59.0000 [IU] | Freq: Every day | SUBCUTANEOUS | Status: DC
  Filled 2016-02-19: qty 0.59

## 2016-02-19 MED ORDER — TAMSULOSIN HCL 0.4 MG PO CAPS: 0.4000 mg | ORAL_CAPSULE | Freq: Every day | ORAL | Status: DC

## 2016-02-19 MED ORDER — AMLODIPINE BESYLATE 10 MG PO TABS: 10.0000 mg | ORAL_TABLET | Freq: Every day | ORAL | Status: DC

## 2016-02-19 MED ORDER — DULOXETINE HCL 30 MG PO CPEP
30.0000 mg | ORAL_CAPSULE | Freq: Every day | ORAL | Status: DC
  Administered 2016-02-19 – 2016-02-20 (×2): 30 mg via ORAL
  Filled 2016-02-19 (×2): qty 1

## 2016-02-19 MED ORDER — PANTOPRAZOLE SODIUM 40 MG PO TBEC
40.0000 mg | DELAYED_RELEASE_TABLET | Freq: Every day | ORAL | Status: DC
  Administered 2016-02-19 – 2016-02-21 (×3): 40 mg via ORAL
  Filled 2016-02-19 (×3): qty 1

## 2016-02-19 MED ORDER — DULOXETINE HCL 30 MG PO CPEP: 30.0000 mg | ORAL_CAPSULE | Freq: Two times a day (BID) | ORAL | Status: DC

## 2016-02-19 MED ORDER — OXYCODONE HCL 5 MG PO TABS: 5.0000 mg | ORAL_TABLET | Freq: Three times a day (TID) | ORAL | Status: DC | PRN

## 2016-02-19 MED ORDER — OXCARBAZEPINE 300 MG PO TABS
300.0000 mg | ORAL_TABLET | Freq: Every day | ORAL | Status: DC
  Administered 2016-02-19 – 2016-02-21 (×3): 300 mg via ORAL
  Filled 2016-02-19 (×3): qty 1

## 2016-02-19 MED ORDER — DULOXETINE HCL 60 MG PO CPEP
60.0000 mg | ORAL_CAPSULE | Freq: Every day | ORAL | Status: DC
  Administered 2016-02-19 – 2016-02-21 (×3): 60 mg via ORAL
  Filled 2016-02-19 (×3): qty 1

## 2016-02-19 MED ORDER — BUPROPION HCL ER (XL) 300 MG PO TB24
300.0000 mg | ORAL_TABLET | Freq: Every day | ORAL | Status: DC
  Administered 2016-02-19 – 2016-02-21 (×3): 300 mg via ORAL
  Filled 2016-02-19 (×3): qty 1

## 2016-02-19 MED ORDER — CLOPIDOGREL BISULFATE 75 MG PO TABS
75.0000 mg | ORAL_TABLET | Freq: Every day | ORAL | Status: DC
  Administered 2016-02-19 – 2016-02-21 (×3): 75 mg via ORAL
  Filled 2016-02-19 (×3): qty 1

## 2016-02-19 MED ORDER — FINASTERIDE 5 MG PO TABS
5.0000 mg | ORAL_TABLET | Freq: Every day | ORAL | Status: DC
Start: 2016-02-19 — End: 2016-02-21
  Administered 2016-02-19 – 2016-02-21 (×3): 5 mg via ORAL
  Filled 2016-02-19 (×3): qty 1

## 2016-02-19 MED ORDER — NITROGLYCERIN 0.4 MG SL SUBL: 0.4000 mg | SUBLINGUAL_TABLET | SUBLINGUAL | Status: DC | PRN

## 2016-02-19 MED ORDER — PREGABALIN 75 MG PO CAPS
225.0000 mg | ORAL_CAPSULE | Freq: Every day | ORAL | Status: DC
  Administered 2016-02-19 – 2016-02-20 (×2): 225 mg via ORAL
  Filled 2016-02-19 (×2): qty 3

## 2016-02-19 MED ORDER — BUSPIRONE HCL 10 MG PO TABS
10.0000 mg | ORAL_TABLET | Freq: Every day | ORAL | Status: DC
  Administered 2016-02-19 – 2016-02-20 (×2): 10 mg via ORAL
  Filled 2016-02-19 (×3): qty 1

## 2016-02-19 MED ORDER — PREGABALIN 75 MG PO CAPS
375.0000 mg | ORAL_CAPSULE | Freq: Every day | ORAL | Status: DC
  Administered 2016-02-19 – 2016-02-21 (×3): 375 mg via ORAL
  Filled 2016-02-19 (×3): qty 5

## 2016-02-19 MED ORDER — OMEGA-3-ACID ETHYL ESTERS 1 G PO CAPS
1.0000 g | ORAL_CAPSULE | Freq: Two times a day (BID) | ORAL | Status: DC
  Administered 2016-02-19 – 2016-02-21 (×5): 1 g via ORAL
  Filled 2016-02-19 (×5): qty 1

## 2016-02-19 MED ORDER — ASPIRIN EC 81 MG PO TBEC
81.0000 mg | DELAYED_RELEASE_TABLET | Freq: Every day | ORAL | Status: DC
  Administered 2016-02-19 – 2016-02-21 (×3): 81 mg via ORAL
  Filled 2016-02-19 (×3): qty 1

## 2016-02-19 MED ORDER — OXYCODONE HCL ER 10 MG PO T12A
20.0000 mg | EXTENDED_RELEASE_TABLET | Freq: Two times a day (BID) | ORAL | Status: DC
  Administered 2016-02-19 – 2016-02-21 (×5): 20 mg via ORAL
  Filled 2016-02-19 (×5): qty 2

## 2016-02-19 NOTE — Progress Notes (Signed)
CCMD called to say a tele lead needed to be looked at - RN entered room and noticed that the patient's bed was saturated with urine. Cleaned pt and changed all linens. Asked patient if we could try and place catheter at this time and pt still refused because he "is still sleepy." Will continue to monitor and continue to offer coude placement.

## 2016-02-19 NOTE — Evaluation (Signed)
Physical Therapy Evaluation Patient Details Name: Victor Wise MRN: [id number] DOB: 10/25/1950 Today's Date: 02/19/2016   History of Present Illness  65 y/o male here with multiple recent falls, came in with R flank pain and re-opening of lip wound from recent fall.  PMH includes including chronic back pain, status post intrathecal stimulator placement, permanent pacemaker placement, diabetes mellitus, obesity, gastroesophageal reflux disease, coronary artery disease, morbid obesity, obstructive sleep apnea, stroke, kidney stones.  Clinical Impression  Pt has had numerous recent falls since moving into his new senior apartment but reports that this is most likely due to recent medical issues as he had been walking around fine, driving, and generally been able to take of errands and himself w/o issue over the previous month.  Pt did well with PT walking with consistent cadence and good relative confidence.  Overall he did well and is confident he will be able to go home w/o issue -  given his recent falls PT did suggest HHPT, though he is not interested and does not feel he stands to get a lot out of it.    Follow Up Recommendations Home health PT (pt not interested, feels when medically better he'll be fine)    Equipment Recommendations  Rolling walker with 5" wheels (pt is safer and more stable with walker)    Recommendations for Other Services       Precautions / Restrictions Precautions Precautions: Fall Restrictions Weight Bearing Restrictions: No      Mobility  Bed Mobility               General bed mobility comments: Pt sitting up on arrival, not tested  Transfers Overall transfer level: Independent Equipment used: Rolling walker (2 wheeled)             General transfer comment: Pt needing some effort to get hands positioned and push to Wise, but ultimately did so w/o direct assist  Ambulation/Gait Ambulation/Gait assistance: Modified independent  (Device/Increase time) Ambulation Distance (Feet): 200 Feet Assistive device: Rolling walker (2 wheeled)       General Gait Details: Pt did well with ambulation showing no real hesitation and was able to maintain appropriate/consistent speed w/o LOBs or stagger steps.  He had some minimal fatigue but his O2 remained in the high 90s and overall he did well.   Stairs            Wheelchair Mobility    Modified Rankin (Stroke Patients Only)       Balance Overall balance assessment:  (good sitting balance and good Wise balance with walker)                                           Pertinent Vitals/Pain Pain Assessment:  (chronic low back pain, lip is hurting, not rated)    Home Living Family/patient expects to be discharged to:: Private residence   Available Help at Discharge: Family (daughter lives locally, is involved and at least calls [facility]) Type of Home: Apartment (for elderly) Home Access:  (1 step from parking lot curb)     Home Layout: One level Home Equipment: Cane - single point;Cane - quad;Wheelchair - manual      Prior Function Level of Independence: Independent with assistive device(s)         Comments: Pt has had multiple recent falls related to admission, but generally had been able  to drive and walk as much as he needed     Hand Dominance        Extremity/Trunk Assessment   Upper Extremity Assessment: Overall WFL for tasks assessed           Lower Extremity Assessment: Overall WFL for tasks assessed         Communication   Communication: No difficulties  Cognition Arousal/Alertness: Awake/alert Behavior During Therapy: Anxious Overall Cognitive Status: Within Functional Limits for tasks assessed                      General Comments      Exercises     Assessment/Plan    PT Assessment Patient needs continued PT services  PT Problem List Decreased strength;Decreased balance;Decreased activity  tolerance;Decreased mobility;Decreased safety awareness;Decreased knowledge of use of DME;Pain          PT Treatment Interventions DME instruction;Gait training;Functional mobility training;Therapeutic activities;Therapeutic exercise;Balance training    PT Goals (Current goals can be found in the Care Plan section)  Acute Rehab PT Goals Patient Stated Goal: go home, get lip taken care of PT Goal Formulation: With patient    Frequency Min 2X/week   Barriers to discharge        Co-evaluation               End of Session Equipment Utilized During Treatment: Gait belt Activity Tolerance: Patient tolerated treatment well Patient left: with chair alarm set;with call bell/phone within reach           Time: 1138 [phone])-1201 PT Time Calculation (min) (ACUTE ONLY): 23 min   Charges:   PT Evaluation $PT Eval Low Complexity: 1 Procedure     PT G CodesKreg Shropshire, DPT 02/19/2016, 1:01 PM

## 2016-02-19 NOTE — Care Management Important Message (Signed)
Important Message  Patient Details  Name: IAIN ZAKOWSKI MRN: [id number] Date of Birth: 15-Jan-1951   Medicare Important Message Given:  Yes    Beverly Sessions, RN 02/19/2016, 3:02 PM

## 2016-02-19 NOTE — Care Management (Signed)
RNCM was updated by PT, that patient would not require any home health services at discharge.  Order for RW placed.  I have notified Collet[facility] [place] with [facility] of need for walker.   Walker to be delivered prior to discharge.  MD notified.

## 2016-02-19 NOTE — Consult Note (Signed)
[facility]  CONSULT NOTE    Date: 02/19/2016                  Patient Name:  Victor Wise  MRN: [id number]  DOB: August 30, 1950  Age / Sex: 65 y.o., male         PCP: Lavera Guise, MD                 Service Requesting Consult: Dr. Leslye Peer                 Reason for Consult: Acute renal failure            History of Present Illness: Victor Wise is a 65 y.o. white male with hypertension, diabetes mellitus type II, obstructive sleep apnea, coronary artery disease, GERD, BPH, pacemaker, right toe amputation, who was admitted to [facility] on 02/18/2016 for Right flank pain [R10.9] Visit for wound check [Z51.89] Acute renal failure, unspecified acute renal failure type ([place]) [N17.9]  Patient fell last week and complains of lip pain.   Admitted with creatinine of 2.38 and started on IV fluids at 160mL/hr. Creatinine improved to 1.64 this morning.    Medications: Outpatient medications: Prescriptions Prior to Admission  Medication Sig Dispense Refill Last Dose  . amLODipine (NORVASC) 10 MG tablet Take 10 mg by mouth daily.   02/18/2016 at 0600  . aspirin EC 81 MG tablet Take 81 mg by mouth daily.   02/18/2016 at 0600  . benazepril (LOTENSIN) 40 MG tablet Take 40 mg by mouth 2 (two) times daily.    02/18/2016 at 0600  . buPROPion (WELLBUTRIN XL) 300 MG 24 hr tablet Take 300 mg by mouth daily.    02/18/2016 at 0600  . busPIRone (BUSPAR) 10 MG tablet Take 10 mg by mouth at bedtime.   02/17/2016 at pm  . clarithromycin (BIAXIN) 500 MG tablet Take 1 tablet (500 mg total) by mouth 2 (two) times daily. 10 tablet 0 02/18/2016 at 0600  . clopidogrel (PLAVIX) 75 MG tablet Take 1 tablet (75 mg total) by mouth daily. 30 tablet 0 02/18/2016 at 0600  . DULoxetine (CYMBALTA) 30 MG capsule Take 30-60 mg by mouth 2 (two) times daily. Pt takes two capsules in the morning and one at night.   02/18/2016 at Unknown time  . Exenatide ER (BYDUREON) 2 MG PEN Inject 2 mg into the skin once a  week. Pt uses on Sunday.   Past Week at Unknown time  . fenofibrate 160 MG tablet Take 1 tablet (160 mg total) by mouth every evening. 30 tablet 0 02/17/2016 at Unknown time  . fluticasone (FLONASE) 50 MCG/ACT nasal spray Place 2 sprays into both nostrils daily.   02/18/2016 at Unknown time  . hydrochlorothiazide (MICROZIDE) 12.5 MG capsule Take 12.5 mg by mouth daily.   02/18/2016 at 0600  . insulin aspart (NOVOLOG) 100 UNIT/ML injection Inject 5 Units into the skin 3 (three) times daily with meals. (Patient taking differently: Inject into the skin 3 (three) times daily with meals. Sliding scale) 10 mL 11 02/18/2016 at 0600  . insulin glargine (LANTUS) 100 UNIT/ML injection Inject 0.4 mLs (40 Units total) into the skin at bedtime. (Patient taking differently: Inject 59 Units into the skin at bedtime. ) 10 mL 11 02/18/2016 at Unknown time  . metaxalone (SKELAXIN) 800 MG tablet Take 400-800 mg by mouth 3 (three) times daily as needed for muscle spasms.   prn at prn  . NARCAN 4  MG/0.1ML LIQD Take 4 mg by mouth as needed (for opioid overdose).    prn at prn  . nitroGLYCERIN (NITROSTAT) 0.4 MG SL tablet Place 1 tablet (0.4 mg total) under the tongue every 5 (five) minutes x 3 doses as needed for chest pain. 25 tablet 1 prn at prn  . omega-3 acid ethyl esters (LOVAZA) 1 g capsule Take 1 capsule (1 g total) by mouth 2 (two) times daily. 60 capsule 0 02/18/2016 at 0600  . Oxcarbazepine (TRILEPTAL) 300 MG tablet Take 300-600 mg by mouth 2 (two) times daily. Pt takes one tablet in the morning and two at night.   02/18/2016 at 0600  . oxyCODONE (OXY IR/ROXICODONE) 5 MG immediate release tablet Take 1 tablet (5 mg total) by mouth 3 (three) times daily as needed for severe pain. 30 tablet 0 Past Week at Unknown time  . oxyCODONE (OXYCONTIN) 20 mg 12 hr tablet Take 1 tablet (20 mg total) by mouth every 12 (twelve) hours. 60 tablet 0 02/18/2016 at 0600  . pantoprazole (PROTONIX) 40 MG tablet Take 40 mg by mouth daily.    02/18/2016 at 0600  . pregabalin (LYRICA) 200 MG capsule Take 200-400 mg by mouth 2 (two) times daily. Pt takes two capsules in the morning and one at night.   02/18/2016 at 0600  . tamsulosin (FLOMAX) 0.4 MG CAPS capsule Take 0.4 mg by mouth daily after supper.    02/18/2016 at 0600  . [DISCONTINUED] acetaminophen (TYLENOL) 325 MG tablet Take 2 tablets (650 mg total) by mouth every 6 (six) hours as needed for mild pain. (Patient not taking: Reported on 02/18/2016)   Not Taking at Unknown time    Current medications: Current Facility-Administered Medications  Medication Dose Route Frequency Provider Last Rate Last Dose  . 0.9 %  sodium chloride infusion   Intravenous Continuous Loletha Grayer, MD 100 mL/hr at 02/19/16 1325    . acetaminophen (TYLENOL) tablet 650 mg  650 mg Oral Q6H PRN Theodoro Grist, MD       Or  . acetaminophen (TYLENOL) suppository 650 mg  650 mg Rectal Q6H PRN Theodoro Grist, MD      . aspirin EC tablet 81 mg  81 mg Oral Daily Loletha Grayer, MD   81 mg at 02/19/16 1057  . buPROPion (WELLBUTRIN XL) 24 hr tablet 300 mg  300 mg Oral Daily Loletha Grayer, MD   300 mg at 02/19/16 1056  . busPIRone (BUSPAR) tablet 10 mg  10 mg Oral QHS Loletha Grayer, MD      . clindamycin (CLEOCIN) IVPB 600 mg  600 mg Intravenous Q8H Loletha Grayer, MD   600 mg at 02/19/16 1331  . clopidogrel (PLAVIX) tablet 75 mg  75 mg Oral Daily Loletha Grayer, MD   75 mg at 02/19/16 1057  . docusate sodium (COLACE) capsule 100 mg  100 mg Oral BID Theodoro Grist, MD   100 mg at 02/19/16 1057  . DULoxetine (CYMBALTA) DR capsule 30 mg  30 mg Oral QHS Loletha Grayer, MD      . DULoxetine (CYMBALTA) DR capsule 60 mg  60 mg Oral Daily Loletha Grayer, MD   60 mg at 02/19/16 1057  . enoxaparin (LOVENOX) injection 40 mg  40 mg Subcutaneous Q24H Theodoro Grist, MD   40 mg at 02/18/16 2201  . fenofibrate tablet 160 mg  160 mg Oral QPM Loletha Grayer, MD      . finasteride (PROSCAR) tablet 5 mg  5 mg Oral Daily  Richard [place],  MD   5 mg at 02/19/16 1057  . fluticasone (FLONASE) 50 MCG/ACT nasal spray 2 spray  2 spray Each Nare Daily Loletha Grayer, MD   2 spray at 02/19/16 1210  . insulin aspart (novoLOG) injection 0-5 Units  0-5 Units Subcutaneous QHS Theodoro Grist, MD      . insulin aspart (novoLOG) injection 0-9 Units  0-9 Units Subcutaneous TID WC Theodoro Grist, MD   3 Units at 02/19/16 1209  . mupirocin cream (BACTROBAN) 2 %   Topical BID Theodoro Grist, MD      . nitroGLYCERIN (NITROSTAT) SL tablet 0.4 mg  0.4 mg Sublingual Q5 Min x 3 PRN Loletha Grayer, MD      . omega-3 acid ethyl esters (LOVAZA) capsule 1 g  1 g Oral BID Loletha Grayer, MD   1 g at 02/19/16 1057  . ondansetron (ZOFRAN) tablet 4 mg  4 mg Oral Q6H PRN Theodoro Grist, MD       Or  . ondansetron (ZOFRAN) injection 4 mg  4 mg Intravenous Q6H PRN Theodoro Grist, MD      . Oxcarbazepine (TRILEPTAL) tablet 300 mg  300 mg Oral Daily Loletha Grayer, MD   300 mg at 02/19/16 1209  . Oxcarbazepine (TRILEPTAL) tablet 600 mg  600 mg Oral QHS Loletha Grayer, MD      . oxyCODONE (Oxy IR/ROXICODONE) immediate release tablet 5 mg  5 mg Oral TID PRN Loletha Grayer, MD      . oxyCODONE (OXYCONTIN) 12 hr tablet 20 mg  20 mg Oral Q12H Loletha Grayer, MD   20 mg at 02/19/16 1056  . pantoprazole (PROTONIX) EC tablet 40 mg  40 mg Oral Daily Loletha Grayer, MD   40 mg at 02/19/16 1057  . pregabalin (LYRICA) capsule 225 mg  225 mg Oral QHS Loletha Grayer, MD      . pregabalin (LYRICA) capsule 375 mg  375 mg Oral Daily Loletha Grayer, MD   375 mg at 02/19/16 1057  . senna (SENOKOT) tablet 8.6 mg  1 tablet Oral BID Theodoro Grist, MD   8.6 mg at 02/19/16 1056  . sodium chloride flush (NS) 0.9 % injection 3 mL  3 mL Intravenous Q12H Theodoro Grist, MD   3 mL at 02/18/16 2201      Allergies: Allergies  Allergen Reactions  . Amoxicillin Shortness Of Breath, Itching and Other (See Comments)    Has patient had a PCN reaction causing immediate rash,  facial/tongue/throat swelling, SOB or lightheadedness with hypotension: Yes Has patient had a PCN reaction causing severe rash involving mucus membranes or skin necrosis: No Has patient had a PCN reaction that required hospitalization No Has patient had a PCN reaction occurring within the last 10 years: No If all of the above answers are "NO", then may proceed with Cephalosporin use.  . Other Anaphylaxis, Itching and Other (See Comments)    Pt states that he is allergic to Endopa.  Reaction:  Anaphylaxis  Pt states that he is allergic to Metabisulfites Reaction:  Itching   . Penicillins Shortness Of Breath, Itching and Other (See Comments)    Has patient had a PCN reaction causing immediate rash, facial/tongue/throat swelling, SOB or lightheadedness with hypotension: Yes Has patient had a PCN reaction causing severe rash involving mucus membranes or skin necrosis: No Has patient had a PCN reaction that required hospitalization No Has patient had a PCN reaction occurring within the last 10 years: No If all of the above answers are "NO", then may  proceed with Cephalosporin use.  [name] Hydralazine Other (See Comments)    Reaction:  Cramping of extremities  . Crestor [Rosuvastatin Calcium] Other (See Comments)    Reaction:  Pt is unable to move arms/legs   . Metoprolol Nausea And Vomiting  . Statins Other (See Comments)    Reaction:  Pt is unable to move arms/legs  . Sulfa Antibiotics Itching  . Yellow Dyes (Non-Tartrazine) Itching  . Aspirin Itching and Other (See Comments)    Pt states that he is able to use in lower doses.    . Tape Rash      Past Medical History: Past Medical History:  Diagnosis Date  . Allergy   . Basal cell carcinoma    forehead  . BPH (benign prostatic hyperplasia)   . Chronic back pain   . Depression   . Diabetes ([place])   . GERD (gastroesophageal reflux disease)   . Heart attack   . Hypertension   . Morbid obesity ([place])   . Obstructive sleep apnea   .  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 Status post insertion of spinal cord stimulator   . Stroke ([place])   . UTI (lower urinary tract infection)      Past Surgical History: Past Surgical History:  Procedure Laterality Date  . CARDIAC CATHETERIZATION N/A 12/19/2014   Procedure: Coronary Stent Intervention;  Surgeon: Charolette Forward, MD;  Location: [facility];  Service: Cardiovascular;  Laterality: N/A;  . CARDIAC CATHETERIZATION Left 12/19/2014   Procedure: Left Heart Cath and Coronary Angiography;  Surgeon: Dionisio Dezmon, MD;  Location: [facility];  Service: Cardiovascular;  Laterality: Left;  . PACEMAKER INSERTION Left 11/02/2015   Procedure: INSERTION PACEMAKER;  Surgeon: Isaias Cowman, MD;  Location: [facility];  Service: Cardiovascular;  Laterality: Left;  . Pain Stimulator    . Right toe amputation       Family History: Family History  Problem Relation Age of Onset  . Breast cancer Mother   . Cancer Mother   . Hypertension Mother   . Lung cancer Father   . Hypertension Father   . Heart disease Father   . Cancer Father   . Kidney disease Sister   . Prostate cancer Neg Hx      Social History: Social History   Social History  . Marital status: Divorced    Spouse name: N/A  . Number of children: N/A  . Years of education: N/A   Occupational History  . Not on file.   Social History Main Topics  . Smoking status: Former Smoker    Types: Cigarettes  . Smokeless tobacco: Never Used     Comment: quit 45 years  . Alcohol use 0.0 oz/week     Comment: occasionally  . Drug use: No  . Sexual activity: Not on file   Other Topics Concern  . Not on file   Social History Narrative  . No narrative on file     Review of Systems: Review of Systems  Constitutional: Negative.   HENT: Negative.   Eyes: Negative.   Respiratory: Negative.   Cardiovascular: Negative.   Gastrointestinal: Negative.   Genitourinary: Negative.   Musculoskeletal: Negative.    Skin: Negative.   Neurological: Negative.   Endo/Heme/Allergies: Negative.   Psychiatric/Behavioral: Negative.     Vital Signs: Blood pressure (!) 142/70, pulse 60, temperature 98.1 F (36.7 C), temperature source Oral, resp. rate 20, height 5' 8.75" (1.746 m), weight 121.9 kg (268 lb 11.2  oz), SpO2 99 %.  Weight trends: Filed Weights   02/18/16 1026 02/19/16 0500  Weight: 122.5 kg (270 lb) 121.9 kg (268 lb 11.2 oz)    Physical Exam: General: NAD, sitting in chair  Head: Normocephalic, atraumatic. Moist oral mucosal membranes  Eyes: Anicteric, PERRL  Neck: Supple, trachea midline  Lungs:  Clear to auscultation  Heart: Regular rate and rhythm  Abdomen:  Soft, nontender,   Extremities:  no peripheral edema.  Neurologic: Nonfocal, moving all four extremities  Skin: No lesions        Lab results: Basic Metabolic Panel:  Recent Labs Lab 02/12/16 1705 02/18/16 1149 02/19/16 0444  NA 132* 138 138  K 4.1 4.8 3.9  CL 99* 100* 103  CO2 24 27 27   GLUCOSE 193* 143* 164*  BUN 21* 26* 21*  CREATININE 1.34* 2.38* 1.64*  CALCIUM 8.7* 9.2 8.5*    Liver Function Tests:  Recent Labs Lab 02/12/16 1705 02/18/16 1149  AST 20 29  ALT 12* 15*  ALKPHOS 51 57  BILITOT 0.4 1.2  PROT 6.8 7.4  ALBUMIN 3.8 4.2   No results for input(s): LIPASE, AMYLASE in the last 168 hours. No results for input(s): AMMONIA in the last 168 hours.  CBC:  Recent Labs Lab 02/12/16 1705 02/18/16 1149 02/19/16 0444  WBC 6.6 9.5 4.6  NEUTROABS 4.8 5.1  --   HGB 12.5* 13.3 12.5*  HCT 37.9* 40.4 37.0*  MCV 78.3* 80.7 79.7*  PLT 220 293 185    Cardiac Enzymes:  Recent Labs Lab 02/18/16 1811 02/18/16 2222 02/19/16 0444  TROPONINI <0.03 <0.03 <0.03    BNP: Invalid input(s): POCBNP  CBG:  Recent Labs Lab 02/18/16 1129 02/18/16 1838 02/18/16 2119 02/19/16 0743 02/19/16 1153  GLUCAP 148* 142* 148* 200* 57*    Microbiology: Results for orders placed or performed during  the hospital encounter of 10/26/15  MRSA PCR Screening     Status: None   Collection Time: 10/26/15 10:32 PM  Result Value Ref Range Status   MRSA by PCR NEGATIVE NEGATIVE Final    Comment:        The GeneXpert MRSA Assay (FDA approved for NASAL specimens only), is one component of a comprehensive MRSA colonization surveillance program. It is not intended to diagnose MRSA infection nor to guide or monitor treatment for MRSA infections.     Coagulation Studies: No results for input(s): LABPROT, INR in the last 72 hours.  Urinalysis:  Recent Labs  02/18/16 1304  COLORURINE STRAW*  LABSPEC 1.006  PHURINE 7.0  GLUCOSEU 50*  HGBUR NEGATIVE  BILIRUBINUR NEGATIVE  KETONESUR NEGATIVE  PROTEINUR NEGATIVE  NITRITE NEGATIVE  LEUKOCYTESUR NEGATIVE      Imaging: Dg Ribs Unilateral W/chest Right  Result Date: 02/18/2016 CLINICAL DATA:  Pt fell face first on Friday and is having right flank pain. Hx of hypertension, diabetes, pacemaker, spinal stimulator. Former smoker. BB marker placed in area of pain. EXAM: RIGHT RIBS AND CHEST - 3+ VIEW COMPARISON:  11/02/2015 FINDINGS: No convincing fracture. There is a small bony protrudes from the anterior right tenth rib end which appears chronic. Cardiac silhouette is mildly enlarged. No mediastinal or hilar masses. Clear lungs. No pleural effusion or pneumothorax. Left anterior chest wall sequential pacemaker is stable. IMPRESSION: 1. No acute rib fracture. 2. No active cardiopulmonary disease. Electronically Signed   By: Lajean Manes M.D.   On: 02/18/2016 13:34   Ct Renal Stone Study  Result Date: 02/18/2016 CLINICAL DATA:  65 y/o M;  right flank pain with history of kidney stones. EXAM: CT ABDOMEN AND PELVIS WITHOUT CONTRAST TECHNIQUE: Multidetector CT imaging of the abdomen and pelvis was performed following the standard protocol without IV contrast. COMPARISON:  None. FINDINGS: Lower chest: Better dependent atelectasis. Partially  visualized pacemaker leads. Hepatobiliary: Nonspecific calcification in dome of liver, likely granuloma. 15 mm fluid attenuating lucency dome of liver is probably a cyst. Status post cholecystectomy. No intra or extrahepatic biliary ductal dilatation Pancreas: Unremarkable. No pancreatic ductal dilatation or surrounding inflammatory changes. Spleen: Normal in size without focal abnormality. Adrenals/Urinary Tract: Adrenal glands are unremarkable. Kidneys are normal, without renal calculi, focal lesion, or hydronephrosis. Bladder is unremarkable. Stomach/Bowel: Stomach is within normal limits. Appendix appears normal. No evidence of bowel wall thickening, distention, or inflammatory changes. Vascular/Lymphatic: Aortic atherosclerosis. No enlarged abdominal or pelvic lymph nodes. Reproductive: Prostate is unremarkable. Other: Small paraumbilical hernia containing fat. Musculoskeletal: Degenerative changes of the spine disc space narrowing greatest at L4-5 and lower lumbar/mid thoracic facet arthropathy. IMPRESSION: 1. No hydronephrosis, urinary stone disease, or obstructive uropathy identified. 2. No acute process of abdomen or pelvis as explanation for abdominal pain. 3. Aortic atherosclerosis. 4. Small paraumbilical hernia containing fat. 5. Status post cholecystectomy. Electronically Signed   By: Kristine Garbe M.D.   On: 02/18/2016 16:56      Assessment & Plan: Victor Wise is a 65 y.o. white male with hypertension, diabetes mellitus type II, obstructive sleep apnea, coronary artery disease, GERD, BPH, pacemaker, right toe amputation, who was admitted to [facility] on 02/18/2016   1. Acute renal failure: baseline creatinine of 1. Acute renal failure from prerenal azotemia.  - Agree with IV fluids - Renally dose to medications - hold home dose hydrochlorothiazide and benazepril.   2. Hypertension: blood pressure at goal.  - holding outpatient medications  3. 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 Hemoglobin A1c 6.9%.    LOS: [place], Wojciech Willetts 10/6/20172:03 PM

## 2016-02-19 NOTE — Progress Notes (Signed)
Patient ID: Victor Wise, male   DOB: 07-Feb-1951, 65 y.o.   MRN: [id number]  [facility] PROGRESS NOTE  Victor Wise DOB: 11/20/1950 DOA: 02/18/2016 PCP: Lavera Guise, MD  HPI/Subjective: Patient asking why he's here. He feels okay. As per the daughter, he has been falling a lot. In one of the falls last week he fell and his tooth went through his lip. His lipids been swollen and painful.  Objective: Vitals:   02/19/16 0935 02/19/16 1341  BP: 127/67 (!) 142/70  Pulse: 69 60  Resp:  20  Temp:  98.1 F (36.7 C)    Filed Weights   02/18/16 1026 02/19/16 0500  Weight: 122.5 kg (270 lb) 121.9 kg (268 lb 11.2 oz)    ROS: Review of Systems  Constitutional: Negative for chills and fever.  Eyes: Negative for blurred vision.  Respiratory: Negative for cough and shortness of breath.   Cardiovascular: Negative for chest pain.  Gastrointestinal: Negative for abdominal pain, constipation, diarrhea, nausea and vomiting.  Genitourinary: Negative for dysuria.  Musculoskeletal: Negative for joint pain.  Neurological: Negative for dizziness and headaches.   Exam: Physical Exam  Constitutional: He is oriented to person, place, and time.  HENT:  Nose: No mucosal edema.  Mouth/Throat: No oropharyngeal exudate or posterior oropharyngeal edema.  Eyes: Conjunctivae, EOM and lids are normal. Pupils are equal, round, and reactive to light.  Neck: No JVD present. Carotid bruit is not present. No edema present. No thyroid mass and no thyromegaly present.  Cardiovascular: Regular rhythm, S1 normal and S2 normal.  Exam reveals no gallop.   No murmur heard. Pulses:      Dorsalis pedis pulses are 2+ on the right side, and 2+ on the left side.  Respiratory: No respiratory distress. He has no wheezes. He has no rhonchi. He has no rales.  GI: Soft. Bowel sounds are normal. There is no tenderness.  Musculoskeletal:       Right ankle: He exhibits swelling.       Left ankle: He exhibits  swelling.  Lymphadenopathy:    He has no cervical adenopathy.  Neurological: He is alert and oriented to person, place, and time. No cranial nerve deficit.  Skin: Skin is warm. Nails show no clubbing.  Lip swollen and painful with slight pus coming out of it. Small little ulceration, of first toe right foot  Psychiatric: He has a normal mood and affect.      Data Reviewed: Basic Metabolic Panel:  Recent Labs Lab 02/12/16 1705 02/18/16 1149 02/19/16 0444  NA 132* 138 138  K 4.1 4.8 3.9  CL 99* 100* 103  CO2 24 27 27   GLUCOSE 193* 143* 164*  BUN 21* 26* 21*  CREATININE 1.34* 2.38* 1.64*  CALCIUM 8.7* 9.2 8.5*   Liver Function Tests:  Recent Labs Lab 02/12/16 1705 02/18/16 1149  AST 20 29  ALT 12* 15*  ALKPHOS 51 57  BILITOT 0.4 1.2  PROT 6.8 7.4  ALBUMIN 3.8 4.2   CBC:  Recent Labs Lab 02/12/16 1705 02/18/16 1149 02/19/16 0444  WBC 6.6 9.5 4.6  NEUTROABS 4.8 5.1  --   HGB 12.5* 13.3 12.5*  HCT 37.9* 40.4 37.0*  MCV 78.3* 80.7 79.7*  PLT 220 293 185   Cardiac Enzymes:  Recent Labs Lab 02/18/16 1811 02/18/16 2222 02/19/16 0444  TROPONINI <0.03 <0.03 <0.03   BNP (last 3 results)  Recent Labs  10/26/15 1533  BNP 19.0     CBG:  Recent Labs Lab 02/18/16 1129 02/18/16 1838 02/18/16 2119 02/19/16 0743 02/19/16 1153  GLUCAP 148* 142* 148* 200* 217*     Studies: Dg Ribs Unilateral W/chest Right  Result Date: 02/18/2016 CLINICAL DATA:  Pt fell face first on Friday and is having right flank pain. Hx of hypertension, diabetes, pacemaker, spinal stimulator. Former smoker. BB marker placed in area of pain. EXAM: RIGHT RIBS AND CHEST - 3+ VIEW COMPARISON:  11/02/2015 FINDINGS: No convincing fracture. There is a small bony protrudes from the anterior right tenth rib end which appears chronic. Cardiac silhouette is mildly enlarged. No mediastinal or hilar masses. Clear lungs. No pleural effusion or pneumothorax. Left anterior chest wall sequential  pacemaker is stable. IMPRESSION: 1. No acute rib fracture. 2. No active cardiopulmonary disease. Electronically Signed   By: Lajean Manes M.D.   On: 02/18/2016 13:34   Ct Renal Stone Study  Result Date: 02/18/2016 CLINICAL DATA:  65 y/o M; right flank pain with history of kidney stones. EXAM: CT ABDOMEN AND PELVIS WITHOUT CONTRAST TECHNIQUE: Multidetector CT imaging of the abdomen and pelvis was performed following the standard protocol without IV contrast. COMPARISON:  None. FINDINGS: Lower chest: Better dependent atelectasis. Partially visualized pacemaker leads. Hepatobiliary: Nonspecific calcification in dome of liver, likely granuloma. 15 mm fluid attenuating lucency dome of liver is probably a cyst. Status post cholecystectomy. No intra or extrahepatic biliary ductal dilatation Pancreas: Unremarkable. No pancreatic ductal dilatation or surrounding inflammatory changes. Spleen: Normal in size without focal abnormality. Adrenals/Urinary Tract: Adrenal glands are unremarkable. Kidneys are normal, without renal calculi, focal lesion, or hydronephrosis. Bladder is unremarkable. Stomach/Bowel: Stomach is within normal limits. Appendix appears normal. No evidence of bowel wall thickening, distention, or inflammatory changes. Vascular/Lymphatic: Aortic atherosclerosis. No enlarged abdominal or pelvic lymph nodes. Reproductive: Prostate is unremarkable. Other: Small paraumbilical hernia containing fat. Musculoskeletal: Degenerative changes of the spine disc space narrowing greatest at L4-5 and lower lumbar/mid thoracic facet arthropathy. IMPRESSION: 1. No hydronephrosis, urinary stone disease, or obstructive uropathy identified. 2. No acute process of abdomen or pelvis as explanation for abdominal pain. 3. Aortic atherosclerosis. 4. Small paraumbilical hernia containing fat. 5. Status post cholecystectomy. Electronically Signed   By: Kristine Garbe M.D.   On: 02/18/2016 16:56    Scheduled Meds: .  aspirin EC  81 mg Oral Daily  . buPROPion  300 mg Oral Daily  . busPIRone  10 mg Oral QHS  . clindamycin (CLEOCIN) IV  600 mg Intravenous Q8H  . clopidogrel  75 mg Oral Daily  . docusate sodium  100 mg Oral BID  . DULoxetine  30 mg Oral QHS  . DULoxetine  60 mg Oral Daily  . enoxaparin (LOVENOX) injection  40 mg Subcutaneous Q24H  . fenofibrate  160 mg Oral QPM  . finasteride  5 mg Oral Daily  . fluticasone  2 spray Each Nare Daily  . insulin aspart  0-5 Units Subcutaneous QHS  . insulin aspart  0-9 Units Subcutaneous TID WC  . mupirocin cream   Topical BID  . omega-3 acid ethyl esters  1 g Oral BID  . Oxcarbazepine  300 mg Oral Daily  . OXcarbazepine  600 mg Oral QHS  . oxyCODONE  20 mg Oral Q12H  . pantoprazole  40 mg Oral Daily  . pregabalin  225 mg Oral QHS  . pregabalin  375 mg Oral Daily  . senna  1 tablet Oral BID  . sodium chloride flush  3 mL Intravenous Q12H   Continuous Infusions: .  sodium chloride 100 mL/hr at 02/19/16 1325    Assessment/Plan:  1. Orthostatic hypotension and syncope and frequent falls. Hold all antihypertensive medication including Flomax, benazepril, Norvasc and hydrochlorothiazide. 2. Lip infection. Start IV clindamycin 3. Acute kidney injury. IV fluid hydration. Hold nephrotoxic medications 4. Chronic pain continue usual pain medications 5. Type 2 diabetes mellitus. Hold Lantus at this time 6.   Code Status:     Code Status Orders        Start     Ordered   02/18/16 1629  Full code  Continuous     02/18/16 1628    Code Status History    Date Active Date Inactive Code Status Order ID Comments User Context   10/26/2015  9:11 PM 11/03/2015  4:16 PM Full Code [id number]  Vaughan Basta, MD Inpatient   10/19/2015 10:00 PM 10/21/2015 11:51 PM Full Code [id number]  Gladstone Lighter, MD Inpatient   12/19/2014  9:31 PM 12/21/2014  5:10 PM Full Code [id number]  Charolette Forward, MD Inpatient   12/19/2014  9:23 PM 12/19/2014  9:31 PM Full Code [id number]   Charolette Forward, MD Inpatient   12/19/2014 12:38 PM 12/19/2014  5:18 PM Full Code [id number]  Dionisio Roylee, MD Inpatient   12/18/2014  6:36 PM 12/19/2014 12:38 PM Full Code [id number]  Henreitta Leber, MD Inpatient     Family Communication: daughter on phone Disposition Plan: daughter   Antibiotics:  cleocin    Time spent: 63 minutes    [place], [facility]

## 2016-02-19 NOTE — Progress Notes (Signed)
Woke pt up to see if coude could be placed at this time - pt is still refusing.

## 2016-02-19 NOTE — Progress Notes (Signed)
We are currently trying to get a coude cath kit to the floor for insertion - pt stated that if he was sleeping when we got the kit, he did not want to be woken up and he would let us place it in the morning.

## 2016-02-20 LAB — BASIC METABOLIC PANEL
Anion gap: 4 — ABNORMAL LOW (ref 5–15)
BUN: 16 mg/dL (ref 6–20)
CALCIUM: 8.7 mg/dL — AB (ref 8.9–10.3)
CHLORIDE: 105 mmol/L (ref 101–111)
CO2: 28 mmol/L (ref 22–32)
CREATININE: 1.16 mg/dL (ref 0.61–1.24)
GFR calc non Af Amer: >60 mL/min (ref >60)
Glucose, Bld: 189 mg/dL — ABNORMAL HIGH (ref 65–99)
Potassium: 4.1 mmol/L (ref 3.5–5.1)
SODIUM: 137 mmol/L (ref 135–145)

## 2016-02-20 LAB — MAGNESIUM: MAGNESIUM: 1.5 mg/dL — AB (ref 1.7–2.4)

## 2016-02-20 LAB — URINE CULTURE

## 2016-02-20 LAB — GLUCOSE, CAPILLARY
GLUCOSE-CAPILLARY: 209 mg/dL — AB (ref 65–99)
Glucose-Capillary: 177 mg/dL — ABNORMAL HIGH (ref 65–99)
Glucose-Capillary: 178 mg/dL — ABNORMAL HIGH (ref 65–99)
Glucose-Capillary: 184 mg/dL — ABNORMAL HIGH (ref 65–99)

## 2016-02-20 MED ORDER — AMLODIPINE BESYLATE 5 MG PO TABS
2.5000 mg | ORAL_TABLET | Freq: Every day | ORAL | Status: DC
  Administered 2016-02-20 – 2016-02-21 (×2): 2.5 mg via ORAL
  Filled 2016-02-20 (×2): qty 1

## 2016-02-20 MED ORDER — MAGNESIUM SULFATE 2 GM/50ML IV SOLN
2.0000 g | Freq: Once | INTRAVENOUS | Status: AC
  Administered 2016-02-20: 2 g via INTRAVENOUS
  Filled 2016-02-20: qty 50

## 2016-02-20 NOTE — Progress Notes (Signed)
Called and spoke with Dr. [name] about patient's telemetry pattern of 25 beat run of Vtach reported by central tele monitoring. Patient asymptomatic. No new orders given at this time. Victor Wise

## 2016-02-20 NOTE — Progress Notes (Signed)
[place] [facility] [place] NAME: Victor Wise    MR#:  [id number]  DATE OF BIRTH:  Oct 05, 1950  SUBJECTIVE:  CHIEF COMPLAINT:   Chief Complaint  Patient presents with  . Wound Check   - feels better, labs improving - worried about falls at home  REVIEW OF SYSTEMS:  Review of Systems  Constitutional: Positive for malaise/fatigue. Negative for chills and fever.  HENT: Negative for ear discharge, ear pain and nosebleeds.   Eyes: Negative for blurred vision and double vision.  Respiratory: Negative for cough, shortness of breath and wheezing.   Cardiovascular: Negative for chest pain and palpitations.  Gastrointestinal: Negative for abdominal pain, constipation, diarrhea, nausea and vomiting.  Genitourinary: Negative for dysuria and urgency.  Musculoskeletal: Positive for falls and myalgias.  Neurological: Positive for weakness. Negative for dizziness, sensory change, speech change, focal weakness, seizures and headaches.    DRUG ALLERGIES:   Allergies  Allergen Reactions  . Amoxicillin Shortness Of Breath, Itching and Other (See Comments)    Has patient had a PCN reaction causing immediate rash, facial/tongue/throat swelling, SOB or lightheadedness with hypotension: Yes Has patient had a PCN reaction causing severe rash involving mucus membranes or skin necrosis: No Has patient had a PCN reaction that required hospitalization No Has patient had a PCN reaction occurring within the last 10 years: No If all of the above answers are "NO", then may proceed with Cephalosporin use.  . Other Anaphylaxis, Itching and Other (See Comments)    Pt states that he is allergic to Endopa.  Reaction:  Anaphylaxis  Pt states that he is allergic to Metabisulfites Reaction:  Itching   . Penicillins Shortness Of Breath, Itching and Other (See Comments)    Has patient had a PCN reaction causing immediate rash, facial/tongue/throat swelling, SOB or lightheadedness  with hypotension: Yes Has patient had a PCN reaction causing severe rash involving mucus membranes or skin necrosis: No Has patient had a PCN reaction that required hospitalization No Has patient had a PCN reaction occurring within the last 10 years: No If all of the above answers are "NO", then may proceed with Cephalosporin use.  [name] Hydralazine Other (See Comments)    Reaction:  Cramping of extremities  . Crestor [Rosuvastatin Calcium] Other (See Comments)    Reaction:  Pt is unable to move arms/legs   . Metoprolol Nausea And Vomiting  . Statins Other (See Comments)    Reaction:  Pt is unable to move arms/legs  . Sulfa Antibiotics Itching  . Yellow Dyes (Non-Tartrazine) Itching  . Aspirin Itching and Other (See Comments)    Pt states that he is able to use in lower doses.    . Tape Rash    VITALS:  Blood pressure (!) 177/87, pulse 63, temperature 98.4 F (36.9 C), temperature source Oral, resp. rate 20, height 5' 8.75" (1.746 m), weight 123.1 kg (271 lb 4.8 oz), SpO2 96 %.  PHYSICAL EXAMINATION:  Physical Exam  GENERAL:  65 y.o.-year-old obese patient sitting in chair  with no acute distress.  EYES: Pupils equal, round, reactive to light and accommodation. No scleral icterus. Extraocular muscles intact.  HEENT: Head atraumatic, normocephalic. Oropharynx and nasopharynx clear.  Mucosal laceration on the lower lip on the inner side with some swelling and minimal purulent discharge noted. NECK:  Supple, no jugular venous distention. No thyroid enlargement, no tenderness.  LUNGS: Normal breath sounds bilaterally, no wheezing, rales,rhonchi or crepitation. No use of accessory muscles of  respiration.  CARDIOVASCULAR: S1, S2 normal. No murmurs, rubs, or gallops.  ABDOMEN: Soft, nontender, nondistended. Bowel sounds present. No organomegaly or mass.  EXTREMITIES: No pedal edema, cyanosis, or clubbing.  NEUROLOGIC: Cranial nerves II through XII are intact. Muscle strength 5/5 in all  extremities. Sensation intact. Gait not checked.  PSYCHIATRIC: The patient is alert and oriented x 3.  SKIN: No obvious rash, lesion, or ulcer.    LABORATORY PANEL:   CBC  Recent Labs Lab 02/19/16 0444  WBC 4.6  HGB 12.5*  HCT 37.0*  PLT 185   ------------------------------------------------------------------------------------------------------------------  Chemistries   Recent Labs Lab 02/18/16 1149  02/20/16 0910  NA 138  < > 137  K 4.8  < > 4.1  CL 100*  < > 105  CO2 27  < > 28  GLUCOSE 143*  < > 189*  BUN 26*  < > 16  CREATININE 2.38*  < > 1.16  CALCIUM 9.2  < > 8.7*  MG  --   --  1.5*  AST 29  --   --   ALT 15*  --   --   ALKPHOS 57  --   --   BILITOT 1.2  --   --   < > = values in this interval not displayed. ------------------------------------------------------------------------------------------------------------------  Cardiac Enzymes  Recent Labs Lab 02/19/16 0444  TROPONINI <0.03   ------------------------------------------------------------------------------------------------------------------  RADIOLOGY:  Dg Ribs Unilateral W/chest Right  Result Date: 02/18/2016 CLINICAL DATA:  Pt fell face first on Friday and is having right flank pain. Hx of hypertension, diabetes, pacemaker, spinal stimulator. Former smoker. BB marker placed in area of pain. EXAM: RIGHT RIBS AND CHEST - 3+ VIEW COMPARISON:  11/02/2015 FINDINGS: No convincing fracture. There is a small bony protrudes from the anterior right tenth rib end which appears chronic. Cardiac silhouette is mildly enlarged. No mediastinal or hilar masses. Clear lungs. No pleural effusion or pneumothorax. Left anterior chest wall sequential pacemaker is stable. IMPRESSION: 1. No acute rib fracture. 2. No active cardiopulmonary disease. Electronically Signed   By: Lajean Manes M.D.   On: 02/18/2016 13:34   Ct Renal Stone Study  Result Date: 02/18/2016 CLINICAL DATA:  65 y/o M; right flank pain with  history of kidney stones. EXAM: CT ABDOMEN AND PELVIS WITHOUT CONTRAST TECHNIQUE: Multidetector CT imaging of the abdomen and pelvis was performed following the standard protocol without IV contrast. COMPARISON:  None. FINDINGS: Lower chest: Better dependent atelectasis. Partially visualized pacemaker leads. Hepatobiliary: Nonspecific calcification in dome of liver, likely granuloma. 15 mm fluid attenuating lucency dome of liver is probably a cyst. Status post cholecystectomy. No intra or extrahepatic biliary ductal dilatation Pancreas: Unremarkable. No pancreatic ductal dilatation or surrounding inflammatory changes. Spleen: Normal in size without focal abnormality. Adrenals/Urinary Tract: Adrenal glands are unremarkable. Kidneys are normal, without renal calculi, focal lesion, or hydronephrosis. Bladder is unremarkable. Stomach/Bowel: Stomach is within normal limits. Appendix appears normal. No evidence of bowel wall thickening, distention, or inflammatory changes. Vascular/Lymphatic: Aortic atherosclerosis. No enlarged abdominal or pelvic lymph nodes. Reproductive: Prostate is unremarkable. Other: Small paraumbilical hernia containing fat. Musculoskeletal: Degenerative changes of the spine disc space narrowing greatest at L4-5 and lower lumbar/mid thoracic facet arthropathy. IMPRESSION: 1. No hydronephrosis, urinary stone disease, or obstructive uropathy identified. 2. No acute process of abdomen or pelvis as explanation for abdominal pain. 3. Aortic atherosclerosis. 4. Small paraumbilical hernia containing fat. 5. Status post cholecystectomy. Electronically Signed   By: Kristine Garbe M.D.   On: 02/18/2016 16:56  EKG:   Orders placed or performed during the hospital encounter of 10/26/15  . EKG 12-Lead  . EKG 12-Lead  . EKG 12-Lead in am (before 8am)  . EKG 12-Lead in am (before 8am)  . EKG    ASSESSMENT AND PLAN:   65 year old male with past medical history significant for sick sinus  syndrome status post pacemaker, diabetes, GERD, obesity, CAD, sleep apnea, history of CVA and chronic back pain presents to the hospital secondary to renal failure and falls and weakness.  #1 acute renal failure- ATN. Improving with fluids. -Back to baseline today. Discontinue IV fluids  #2 falls and deconditioning-physical therapy recommended home health. -Social worker will be added. Significant deconditioning. -Will need a walker  #3 depression and anxiety-continue Wellbutrin, Trileptal, Cymbalta and BuSpar  #4 chronic back pain and neuropathy-continue home medications  #5 hypomagnesemia-being replaced  #6 lip injury from the fall-mucosal cut. Continue clindamycin for 7 days  #7 DVT prophylaxis-on Lovenox    All the records are reviewed and case discussed with Care Management/Social Workerr. Management plans discussed with the patient, family and they are in agreement.  CODE STATUS: Full Code  TOTAL TIME TAKING CARE OF THIS PATIENT: 37 minutes.   POSSIBLE D/C TOMORROW, DEPENDING ON CLINICAL CONDITION.   Olivier Frayre M.D on 02/20/2016 at 11:59 AM  Between 7am to 6pm - Pager - [phone]  After 6pm go to [email or web address] - password [email or web address]  Office  [phone]  CC: Primary care physician; Lavera Guise, MD

## 2016-02-20 NOTE — Progress Notes (Signed)
[facility]  ROUNDING NOTE   Subjective:   Sitting up in bed.  Feeling well. Complains of lip pain  Objective:  Vital signs in last 24 hours:  Temp:  [98.1 F (36.7 C)-98.4 F (36.9 C)] 98.4 F (36.9 C) (10/07 0529) Pulse Rate:  [60-68] 63 (10/07 0529) Resp:  [18-20] 20 (10/07 0529) BP: (136-177)/(70-91) 177/87 (10/07 0529) SpO2:  [96 %-99 %] 96 % (10/07 0529) Weight:  [123.1 kg (271 lb 4.8 oz)] 123.1 kg (271 lb 4.8 oz) (10/07 0529)  Weight change: 0.59 kg (1 lb 4.8 oz) Filed Weights   02/18/16 1026 02/19/16 0500 02/20/16 0529  Weight: 122.5 kg (270 lb) 121.9 kg (268 lb 11.2 oz) 123.1 kg (271 lb 4.8 oz)    Intake/Output: I/O last 3 completed shifts: In: 4060.8 [P.O.:840; I.V.:3120.8; IV Piggyback:100] Out: 3665 [Urine:3665]   Intake/Output this shift:  Total I/O In: 452 [I.V.:452] Out: 1000 [Urine:1000]  Physical Exam: General: NAD  Head: Normocephalic, atraumatic. Moist oral mucosal membranes  Eyes: Anicteric, PERRL  Neck: Supple, trachea midline  Lungs:  Clear to auscultation  Heart: Regular rate and rhythm  Abdomen:  Soft, nontender, obese  Extremities:  no peripheral edema.  Neurologic: Nonfocal, moving all four extremities  Skin: No lesions       Basic Metabolic Panel:  Recent Labs Lab 02/18/16 1149 02/19/16 0444 02/20/16 0910  NA 138 138 137  K 4.8 3.9 4.1  CL 100* 103 105  CO2 27 27 28   GLUCOSE 143* 164* 189*  BUN 26* 21* 16  CREATININE 2.38* 1.64* 1.16  CALCIUM 9.2 8.5* 8.7*  MG  --   --  1.5*    Liver Function Tests:  Recent Labs Lab 02/18/16 1149  AST 29  ALT 15*  ALKPHOS 57  BILITOT 1.2  PROT 7.4  ALBUMIN 4.2   No results for input(s): LIPASE, AMYLASE in the last 168 hours. No results for input(s): AMMONIA in the last 168 hours.  CBC:  Recent Labs Lab 02/18/16 1149 02/19/16 0444  WBC 9.5 4.6  NEUTROABS 5.1  --   HGB 13.3 12.5*  HCT 40.4 37.0*  MCV 80.7 79.7*  PLT 293 185    Cardiac  Enzymes:  Recent Labs Lab 02/18/16 1811 02/18/16 2222 02/19/16 0444  TROPONINI <0.03 <0.03 <0.03    BNP: Invalid input(s): POCBNP  CBG:  Recent Labs Lab 02/19/16 0743 02/19/16 1153 02/19/16 1642 02/19/16 2141 02/20/16 0736  GLUCAP 200* 217* 210* 170* 209*    Microbiology: Results for orders placed or performed during the hospital encounter of 10/26/15  MRSA PCR Screening     Status: None   Collection Time: 10/26/15 10:32 PM  Result Value Ref Range Status   MRSA by PCR NEGATIVE NEGATIVE Final    Comment:        The GeneXpert MRSA Assay (FDA approved for NASAL specimens only), is one component of a comprehensive MRSA colonization surveillance program. It is not intended to diagnose MRSA infection nor to guide or monitor treatment for MRSA infections.     Coagulation Studies: No results for input(s): LABPROT, INR in the last 72 hours.  Urinalysis:  Recent Labs  02/18/16 1304  COLORURINE STRAW*  LABSPEC 1.006  PHURINE 7.0  GLUCOSEU 50*  HGBUR NEGATIVE  BILIRUBINUR NEGATIVE  KETONESUR NEGATIVE  PROTEINUR NEGATIVE  NITRITE NEGATIVE  LEUKOCYTESUR NEGATIVE      Imaging: Dg Ribs Unilateral W/chest Right  Result Date: 02/18/2016 CLINICAL DATA:  Pt fell face first on Friday and is  having right flank pain. Hx of hypertension, diabetes, pacemaker, spinal stimulator. Former smoker. BB marker placed in area of pain. EXAM: RIGHT RIBS AND CHEST - 3+ VIEW COMPARISON:  11/02/2015 FINDINGS: No convincing fracture. There is a small bony protrudes from the anterior right tenth rib end which appears chronic. Cardiac silhouette is mildly enlarged. No mediastinal or hilar masses. Clear lungs. No pleural effusion or pneumothorax. Left anterior chest wall sequential pacemaker is stable. IMPRESSION: 1. No acute rib fracture. 2. No active cardiopulmonary disease. Electronically Signed   By: Lajean Manes M.D.   On: 02/18/2016 13:34   Ct Renal Stone Study  Result Date:  02/18/2016 CLINICAL DATA:  65 y/o M; right flank pain with history of kidney stones. EXAM: CT ABDOMEN AND PELVIS WITHOUT CONTRAST TECHNIQUE: Multidetector CT imaging of the abdomen and pelvis was performed following the standard protocol without IV contrast. COMPARISON:  None. FINDINGS: Lower chest: Better dependent atelectasis. Partially visualized pacemaker leads. Hepatobiliary: Nonspecific calcification in dome of liver, likely granuloma. 15 mm fluid attenuating lucency dome of liver is probably a cyst. Status post cholecystectomy. No intra or extrahepatic biliary ductal dilatation Pancreas: Unremarkable. No pancreatic ductal dilatation or surrounding inflammatory changes. Spleen: Normal in size without focal abnormality. Adrenals/Urinary Tract: Adrenal glands are unremarkable. Kidneys are normal, without renal calculi, focal lesion, or hydronephrosis. Bladder is unremarkable. Stomach/Bowel: Stomach is within normal limits. Appendix appears normal. No evidence of bowel wall thickening, distention, or inflammatory changes. Vascular/Lymphatic: Aortic atherosclerosis. No enlarged abdominal or pelvic lymph nodes. Reproductive: Prostate is unremarkable. Other: Small paraumbilical hernia containing fat. Musculoskeletal: Degenerative changes of the spine disc space narrowing greatest at L4-5 and lower lumbar/mid thoracic facet arthropathy. IMPRESSION: 1. No hydronephrosis, urinary stone disease, or obstructive uropathy identified. 2. No acute process of abdomen or pelvis as explanation for abdominal pain. 3. Aortic atherosclerosis. 4. Small paraumbilical hernia containing fat. 5. Status post cholecystectomy. Electronically Signed   By: Kristine Garbe M.D.   On: 02/18/2016 16:56     Medications:     . amLODipine  2.5 mg Oral Daily  . aspirin EC  81 mg Oral Daily  . buPROPion  300 mg Oral Daily  . busPIRone  10 mg Oral QHS  . clindamycin (CLEOCIN) IV  600 mg Intravenous Q8H  . clopidogrel  75 mg Oral  Daily  . docusate sodium  100 mg Oral BID  . DULoxetine  30 mg Oral QHS  . DULoxetine  60 mg Oral Daily  . enoxaparin (LOVENOX) injection  40 mg Subcutaneous Q24H  . fenofibrate  160 mg Oral QPM  . finasteride  5 mg Oral Daily  . fluticasone  2 spray Each Nare Daily  . insulin aspart  0-5 Units Subcutaneous QHS  . insulin aspart  0-9 Units Subcutaneous TID WC  . mupirocin cream   Topical BID  . omega-3 acid ethyl esters  1 g Oral BID  . Oxcarbazepine  300 mg Oral Daily  . OXcarbazepine  600 mg Oral QHS  . oxyCODONE  20 mg Oral Q12H  . pantoprazole  40 mg Oral Daily  . pregabalin  225 mg Oral QHS  . pregabalin  375 mg Oral Daily  . senna  1 tablet Oral BID  . sodium chloride flush  3 mL Intravenous Q12H   acetaminophen **OR** acetaminophen, nitroGLYCERIN, ondansetron **OR** ondansetron (ZOFRAN) IV, oxyCODONE  Assessment/ Plan:  Mr. Victor Wise is a 65 y.o. white male with hypertension, diabetes mellitus type II, obstructive sleep apnea, coronary artery  disease, GERD, BPH, pacemaker, right toe amputation, who was admitted to [facility] on 02/18/2016   1. Acute renal failure: baseline creatinine of 1. Acute renal failure from prerenal azotemia.  - Discontinue IV fluids, encourage PO intake - Renally dose to medications - hold home dose hydrochlorothiazide and benazepril.   2. Hypertension: blood pressure at goal.  - holding outpatient medications  3. 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 Hemoglobin A1c 6.9%.   Will sign off. Please call with questions.    LOS: 2 Dejia Ebron 10/7/201710:32 AM

## 2016-02-21 DIAGNOSIS — Z23 Encounter for immunization: Secondary | ICD-10-CM | POA: Diagnosis not present

## 2016-02-21 LAB — GLUCOSE, CAPILLARY
Glucose-Capillary: 175 mg/dL — ABNORMAL HIGH (ref 65–99)
Glucose-Capillary: 193 mg/dL — ABNORMAL HIGH (ref 65–99)

## 2016-02-21 MED ORDER — FINASTERIDE 5 MG PO TABS: 5.0000 mg | ORAL_TABLET | Freq: Every day | ORAL | 0 refills | Status: DC

## 2016-02-21 MED ORDER — PREGABALIN 75 MG PO CAPS: 375.0000 mg | ORAL_CAPSULE | Freq: Every day | ORAL | 2 refills | Status: DC

## 2016-02-21 MED ORDER — BENAZEPRIL HCL 20 MG PO TABS: 20.0000 mg | ORAL_TABLET | Freq: Every day | ORAL | 2 refills | Status: DC

## 2016-02-21 MED ORDER — SENNA 8.6 MG PO TABS: 1.0000 | ORAL_TABLET | Freq: Two times a day (BID) | ORAL | 0 refills | Status: DC

## 2016-02-21 MED ORDER — PREGABALIN 225 MG PO CAPS: 225.0000 mg | ORAL_CAPSULE | Freq: Every day | ORAL | 0 refills | Status: DC

## 2016-02-21 MED ORDER — AMLODIPINE BESYLATE 5 MG PO TABS: 5.0000 mg | ORAL_TABLET | Freq: Every day | ORAL | 2 refills | Status: DC

## 2016-02-21 MED ORDER — CLINDAMYCIN HCL 300 MG PO CAPS: 300.0000 mg | ORAL_CAPSULE | Freq: Three times a day (TID) | ORAL | 0 refills | Status: AC

## 2016-02-21 NOTE — Care Management Note (Signed)
Case Management Note  Patient Details  Name: Victor Wise MRN: [id number] Date of Birth: June 07, 1950  Subjective/Objective:        [name] told Dr [name] that he is agreeable to have home health services. A referral was sent to [facility] requesting home health PT, RN, Aide, SW. [name] already has a rolling walker from [place] at bedside that was delivered last week. .              Action/Plan:   Expected Discharge Date:                  Expected Discharge Plan:     In-House Referral:     Discharge planning Services     Post Acute Care Choice:    Choice offered to:     DME Arranged:    DME Agency:     HH Arranged:    HH Agency:     Status of Service:     If discussed at [place] of Stay Meetings, dates discussed:    Additional Comments:  Izzac  A, RN 02/21/2016, 12:32 PM

## 2016-02-21 NOTE — Progress Notes (Signed)
02/21/2016 12:53 PM  Victor Wise to be D/C'd Home per MD order.  Discussed prescriptions and follow up appointments with the patient. Prescriptions given to patient, medication list explained in detail. Pt verbalized understanding.    Medication List    STOP taking these medications   BYDUREON 2 MG Pen Generic drug:  Exenatide ER   clarithromycin 500 MG tablet Commonly known as:  BIAXIN   hydrochlorothiazide 12.5 MG capsule Commonly known as:  MICROZIDE   insulin glargine 100 UNIT/ML injection Commonly known as:  LANTUS   metaxalone 800 MG tablet Commonly known as:  SKELAXIN     TAKE these medications   amLODipine 5 MG tablet Commonly known as:  NORVASC Take 1 tablet (5 mg total) by mouth daily. What changed:  medication strength  how much to take   aspirin EC 81 MG tablet Take 81 mg by mouth daily.   benazepril 20 MG tablet Commonly known as:  LOTENSIN Take 1 tablet (20 mg total) by mouth daily. What changed:  medication strength  how much to take  when to take this   buPROPion 300 MG 24 hr tablet Commonly known as:  WELLBUTRIN XL Take 300 mg by mouth daily.   busPIRone 10 MG tablet Commonly known as:  BUSPAR Take 10 mg by mouth at bedtime.   clindamycin 300 MG capsule Commonly known as:  CLEOCIN Take 1 capsule (300 mg total) by mouth 3 (three) times daily. X 5 more days   clopidogrel 75 MG tablet Commonly known as:  PLAVIX Take 1 tablet (75 mg total) by mouth daily.   DULoxetine 30 MG capsule Commonly known as:  CYMBALTA Take 30-60 mg by mouth 2 (two) times daily. Pt takes two capsules in the morning and one at night.   fenofibrate 160 MG tablet Take 1 tablet (160 mg total) by mouth every evening.   finasteride 5 MG tablet Commonly known as:  PROSCAR Take 1 tablet (5 mg total) by mouth daily. Start taking on:  02/22/2016   fluticasone 50 MCG/ACT nasal spray Commonly known as:  FLONASE Place 2 sprays into both nostrils daily.   insulin  aspart 100 UNIT/ML injection Commonly known as:  novoLOG Inject 5 Units into the skin 3 (three) times daily with meals. What changed:  how much to take  additional instructions   NARCAN 4 MG/0.1ML Liqd Generic drug:  naloxone HCl Take 4 mg by mouth as needed (for opioid overdose).   nitroGLYCERIN 0.4 MG SL tablet Commonly known as:  NITROSTAT Place 1 tablet (0.4 mg total) under the tongue every 5 (five) minutes x 3 doses as needed for chest pain.   omega-3 acid ethyl esters 1 g capsule Commonly known as:  LOVAZA Take 1 capsule (1 g total) by mouth 2 (two) times daily.   Oxcarbazepine 300 MG tablet Commonly known as:  TRILEPTAL Take 300-600 mg by mouth 2 (two) times daily. Pt takes one tablet in the morning and two at night.   oxyCODONE 5 MG immediate release tablet Commonly known as:  Oxy IR/ROXICODONE Take 1 tablet (5 mg total) by mouth 3 (three) times daily as needed for severe pain.   oxyCODONE 20 mg 12 hr tablet Commonly known as:  OXYCONTIN Take 1 tablet (20 mg total) by mouth every 12 (twelve) hours.   pantoprazole 40 MG tablet Commonly known as:  PROTONIX Take 40 mg by mouth daily.   pregabalin 200 MG capsule Commonly known as:  LYRICA Take 200-400 mg by mouth  2 (two) times daily. Pt takes two capsules in the morning and one at night.   senna 8.6 MG Tabs tablet Commonly known as:  SENOKOT Take 1 tablet (8.6 mg total) by mouth 2 (two) times daily.   tamsulosin 0.4 MG Caps capsule Commonly known as:  FLOMAX Take 0.4 mg by mouth daily after supper.       Vitals:   02/20/16 2016 02/21/16 0507  BP: (!) 168/86 (!) 167/66  Pulse: (!) 59 60  Resp: 16 20  Temp: 98.2 F (36.8 C) 98.3 F (36.8 C)    Skin clean, dry and intact without evidence of skin break down, no evidence of skin tears noted. IV catheter discontinued intact. Site without signs and symptoms of complications. Dressing and pressure applied. Pt denies pain at this time. No complaints  noted.  An After Visit Summary was printed and given to the patient. Patient escorted via [place], and D/C home via private auto.  Dola Argyle

## 2016-02-21 NOTE — Care Management Note (Signed)
Case Management Note  Patient Details  Name: Victor Wise MRN: [id number] Date of Birth: April 07, 1951  Subjective/Objective:     Discussed discharge planning with Victor Wise who once again stated that he does not want home health services. A rolling walker was delivered to Victor Wise Wise room yesterday.  Dr Dede Query was updated.                Action/Plan:   Expected Discharge Date:                  Expected Discharge Plan:     In-House Referral:     Discharge planning Services     Post Acute Care Choice:    Choice offered to:     DME Arranged:    DME Agency:     HH Arranged:    HH Agency:     Status of Service:     If discussed at [employer] of Stay Meetings, dates discussed:    Additional Comments:  [place] Masaki A, RN 02/21/2016, 10:43 AM

## 2016-02-21 NOTE — Discharge Summary (Addendum)
[facility] [place] NAME: Victor Wise    MR#:  [id number]  DATE OF BIRTH:  November 08, 1950  DATE OF ADMISSION:  02/18/2016   ADMITTING PHYSICIAN: Theodoro Grist, MD  DATE OF DISCHARGE: 02/21/2016  PRIMARY CARE PHYSICIAN: Lavera Guise, MD   ADMISSION DIAGNOSIS:   Right flank pain [R10.9] Visit for wound check [Z51.89] Acute renal failure, unspecified acute renal failure type ([place]) [N17.9]  DISCHARGE DIAGNOSIS:   Active Problems:   Acute renal failure (ARF) (HCC)   Hypotension   SECONDARY DIAGNOSIS:   Past Medical History:  Diagnosis Date  . Allergy   . Basal cell carcinoma    forehead  . BPH (benign prostatic hyperplasia)   . Chronic back pain   . Depression   . Diabetes ([place])   . GERD (gastroesophageal reflux disease)   . Heart attack   . Hypertension   . Morbid obesity ([place])   . Obstructive sleep apnea   . 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 Status post insertion of spinal cord stimulator   . Stroke ([place])   . UTI (lower urinary tract infection)     HOSPITAL COURSE:   65 year old male with past medical history significant for sick sinus syndrome status post pacemaker, diabetes, GERD, obesity, CAD, sleep apnea, history of CVA and chronic back pain presents to the hospital secondary to renal failure and falls and weakness.  #1 acute renal failure- ATN. Improved with fluids. -Back to baseline now. Stopped IV fluids - appreciate nephrology consult -CT of the abdomen with unremarkable kidneys  #2 falls and deconditioning-physical therapy recommended home health. -Social worker will be added. Significant deconditioning. - monitor his multiple pain medications as well- did well in the hospital -Will need a walker  #3 depression and anxiety-continue Wellbutrin, Trileptal, Cymbalta and BuSpar  #4 chronic back pain and neuropathy-continue home pain medications  #5 HTN- medications adjusted as BP was low on admission-  being discharged on norvasc (half the dose as outpatient) and benazepril (half the dose as outpatient)  #6 lip injury from the fall-mucosal cut. Continue clindamycin for 7 days  #7 DM- sugars were low in hospital- hold lantus and byetta. Only on novolog pre meals- outpatient follow up recommended again  Discharge today   DISCHARGE CONDITIONS:   Stable CONSULTS OBTAINED:   Treatment Team:  Lavonia Dana, MD Gladstone Lighter, MD  DRUG ALLERGIES:   Allergies  Allergen Reactions  . Amoxicillin Shortness Of Breath, Itching and Other (See Comments)    Has patient had a PCN reaction causing immediate rash, facial/tongue/throat swelling, SOB Wise lightheadedness with hypotension: Yes Has patient had a PCN reaction causing severe rash involving mucus membranes Wise skin necrosis: No Has patient had a PCN reaction that required hospitalization No Has patient had a PCN reaction occurring within the last 10 years: No If all of the above answers are "NO", then may proceed with Cephalosporin use.  . Other Anaphylaxis, Itching and Other (See Comments)    Pt states that he is allergic to Endopa.  Reaction:  Anaphylaxis  Pt states that he is allergic to Metabisulfites Reaction:  Itching   . Penicillins Shortness Of Breath, Itching and Other (See Comments)    Has patient had a PCN reaction causing immediate rash, facial/tongue/throat swelling, SOB Wise lightheadedness with hypotension: Yes Has patient had a PCN reaction causing severe rash involving mucus membranes Wise skin necrosis: No Has patient had a PCN reaction that required hospitalization No Has  patient had a PCN reaction occurring within the last 10 years: No If all of the above answers are "NO", then may proceed with Cephalosporin use.  [name] Hydralazine Other (See Comments)    Reaction:  Cramping of extremities  . Crestor [Rosuvastatin Calcium] Other (See Comments)    Reaction:  Pt is unable to move arms/legs   . Metoprolol Nausea And  Vomiting  . Statins Other (See Comments)    Reaction:  Pt is unable to move arms/legs  . Sulfa Antibiotics Itching  . Yellow Dyes (Non-Tartrazine) Itching  . Aspirin Itching and Other (See Comments)    Pt states that he is able to use in lower doses.    . Tape Rash   DISCHARGE MEDICATIONS:     Medication List    STOP taking these medications   BYDUREON 2 MG Pen Generic drug:  Exenatide ER   clarithromycin 500 MG tablet Commonly known as:  BIAXIN   hydrochlorothiazide 12.5 MG capsule Commonly known as:  MICROZIDE   insulin glargine 100 UNIT/ML injection Commonly known as:  LANTUS   metaxalone 800 MG tablet Commonly known as:  SKELAXIN     TAKE these medications   amLODipine 5 MG tablet Commonly known as:  NORVASC Take 1 tablet (5 mg total) by mouth daily. What changed:  medication strength  how much to take   aspirin EC 81 MG tablet Take 81 mg by mouth daily.   benazepril 20 MG tablet Commonly known as:  LOTENSIN Take 1 tablet (20 mg total) by mouth daily. What changed:  medication strength  how much to take  when to take this   buPROPion 300 MG 24 hr tablet Commonly known as:  WELLBUTRIN XL Take 300 mg by mouth daily.   busPIRone 10 MG tablet Commonly known as:  BUSPAR Take 10 mg by mouth at bedtime.   clindamycin 300 MG capsule Commonly known as:  CLEOCIN Take 1 capsule (300 mg total) by mouth 3 (three) times daily. X 5 more days   clopidogrel 75 MG tablet Commonly known as:  PLAVIX Take 1 tablet (75 mg total) by mouth daily.   DULoxetine 30 MG capsule Commonly known as:  CYMBALTA Take 30-60 mg by mouth 2 (two) times daily. Pt takes two capsules in the morning and one at night.   fenofibrate 160 MG tablet Take 1 tablet (160 mg total) by mouth every evening.   finasteride 5 MG tablet Commonly known as:  PROSCAR Take 1 tablet (5 mg total) by mouth daily. Start taking on:  02/22/2016   fluticasone 50 MCG/ACT nasal spray Commonly known  as:  FLONASE Place 2 sprays into both nostrils daily.   insulin aspart 100 UNIT/ML injection Commonly known as:  novoLOG Inject 5 Units into the skin 3 (three) times daily with meals. What changed:  how much to take  additional instructions   NARCAN 4 MG/0.1ML Liqd Generic drug:  naloxone HCl Take 4 mg by mouth as needed (for opioid overdose).   nitroGLYCERIN 0.4 MG SL tablet Commonly known as:  NITROSTAT Place 1 tablet (0.4 mg total) under the tongue every 5 (five) minutes x 3 doses as needed for chest pain.   omega-3 acid ethyl esters 1 g capsule Commonly known as:  LOVAZA Take 1 capsule (1 g total) by mouth 2 (two) times daily.   Oxcarbazepine 300 MG tablet Commonly known as:  TRILEPTAL Take 300-600 mg by mouth 2 (two) times daily. Pt takes one tablet in the morning and  two at night.   oxyCODONE 5 MG immediate release tablet Commonly known as:  Oxy IR/ROXICODONE Take 1 tablet (5 mg total) by mouth 3 (three) times daily as needed for severe pain.   oxyCODONE 20 mg 12 hr tablet Commonly known as:  OXYCONTIN Take 1 tablet (20 mg total) by mouth every 12 (twelve) hours.   pantoprazole 40 MG tablet Commonly known as:  PROTONIX Take 40 mg by mouth daily.   pregabalin 200 MG capsule Commonly known as:  LYRICA Take 200-400 mg by mouth 2 (two) times daily. Pt takes two capsules in the morning and one at night.   senna 8.6 MG Tabs tablet Commonly known as:  SENOKOT Take 1 tablet (8.6 mg total) by mouth 2 (two) times daily.   tamsulosin 0.4 MG Caps capsule Commonly known as:  FLOMAX Take 0.4 mg by mouth daily after supper.        DISCHARGE INSTRUCTIONS:   1. PCP follow-up in 1-2 weeks  DIET:   Cardiac diet  ACTIVITY:   Activity as tolerated  OXYGEN:   Home Oxygen: No.  Oxygen Delivery: room air  DISCHARGE LOCATION:   home with home health services    If you experience worsening of your admission symptoms, develop shortness of breath, life  threatening emergency, suicidal Wise homicidal thoughts you must seek medical attention immediately by calling 911 Wise calling your MD immediately  if symptoms less severe.  You Must read complete instructions/literature along with all the possible adverse reactions/side effects for all the Medicines you take and that have been prescribed to you. Take any new Medicines after you have completely understood and accpet all the possible adverse reactions/side effects.   Please note  You were cared for by a hospitalist during your hospital stay. If you have any questions about your discharge medications Wise the care you received while you were in the hospital after you are discharged, you can call the unit and asked to speak with the hospitalist on call if the hospitalist that took care of you is not available. Once you are discharged, your primary care physician will handle any further medical issues. Please note that NO REFILLS for any discharge medications will be authorized once you are discharged, as it is imperative that you return to your primary care physician (Wise establish a relationship with a primary care physician if you do not have one) for your aftercare needs so that they can reassess your need for medications and monitor your lab values.    On the day of Discharge:  VITAL SIGNS:   Blood pressure (!) 167/66, pulse 60, temperature 98.3 F (36.8 C), temperature source Oral, resp. rate 20, height 5' 8.75" (1.746 m), weight 123.9 kg (273 lb 3.2 oz), SpO2 98 %.  PHYSICAL EXAMINATION:    GENERAL:  65 y.o.-year-old obese patient sitting in chair  with no acute distress.  EYES: Pupils equal, round, reactive to light and accommodation. No scleral icterus. Extraocular muscles intact.  HEENT: Head atraumatic, normocephalic. Oropharynx and nasopharynx clear.  Mucosal laceration on the lower lip on the inner side with some swelling and minimal purulent discharge noted. NECK:  Supple, no jugular  venous distention. No thyroid enlargement, no tenderness.  LUNGS: Normal breath sounds bilaterally, no wheezing, rales,rhonchi Wise crepitation. No use of accessory muscles of respiration.  CARDIOVASCULAR: S1, S2 normal. No murmurs, rubs, Wise gallops.  ABDOMEN: Soft, nontender, nondistended. Bowel sounds present. No organomegaly Wise mass.  EXTREMITIES: No pedal edema, cyanosis, Wise clubbing.  NEUROLOGIC: Cranial nerves II through XII are intact. Muscle strength 5/5 in all extremities. Sensation intact. Gait not checked.  PSYCHIATRIC: The patient is alert and oriented x 3.  SKIN: No obvious rash, lesion, Wise ulcer.    DATA REVIEW:   CBC  Recent Labs Lab 02/19/16 0444  WBC 4.6  HGB 12.5*  HCT 37.0*  PLT 185    Chemistries   Recent Labs Lab 02/18/16 1149  02/20/16 0910  NA 138  < > 137  K 4.8  < > 4.1  CL 100*  < > 105  CO2 27  < > 28  GLUCOSE 143*  < > 189*  BUN 26*  < > 16  CREATININE 2.38*  < > 1.16  CALCIUM 9.2  < > 8.7*  MG  --   --  1.5*  AST 29  --   --   ALT 15*  --   --   ALKPHOS 57  --   --   BILITOT 1.2  --   --   < > = values in this interval not displayed.   Microbiology Results  Results for orders placed Wise performed during the hospital encounter of 02/18/16  Urine culture     Status: Abnormal   Collection Time: 02/19/16  1:04 PM  Result Value Ref Range Status   Specimen Description URINE, RANDOM  Final   Special Requests NONE  Final   Culture (A)  Final    <10,000 COLONIES/mL INSIGNIFICANT GROWTH Performed at [facility]    Report Status 02/20/2016 FINAL  Final    RADIOLOGY:  No results found.   Management plans discussed with the patient, family and they are in agreement.  CODE STATUS:     Code Status Orders        Start     Ordered   02/18/16 1629  Full code  Continuous     02/18/16 1628    Code Status History    Date Active Date Inactive Code Status Order ID Comments User Context   10/26/2015  9:11 PM 11/03/2015  4:16 PM  Full Code [id number]  Vaughan Basta, MD Inpatient   10/19/2015 10:00 PM 10/21/2015 11:51 PM Full Code [id number]  Gladstone Lighter, MD Inpatient   12/19/2014  9:31 PM 12/21/2014  5:10 PM Full Code [id number]  Charolette Forward, MD Inpatient   12/19/2014  9:23 PM 12/19/2014  9:31 PM Full Code [id number]  Charolette Forward, MD Inpatient   12/19/2014 12:38 PM 12/19/2014  5:18 PM Full Code [id number]  Dionisio Shaquill, MD Inpatient   12/18/2014  6:36 PM 12/19/2014 12:38 PM Full Code [id number]  Henreitta Leber, MD Inpatient      TOTAL TIME TAKING CARE OF THIS PATIENT: 37 minutes.    Aubrey Voong M.D on 02/21/2016 at 11:31 AM  Between 7am to 6pm - Pager - [phone]  After 6pm go to [email or web address] - Proofreader  [facility]  Office  [phone]  CC: Primary care physician; Lavera Guise, MD   Note: This dictation was prepared with Dragon dictation along with smaller phrase technology. Any transcriptional errors that result from this process are unintentional.

## 2016-02-22 ENCOUNTER — Other Ambulatory Visit: Payer: Self-pay | Admitting: *Deleted

## 2016-02-22 ENCOUNTER — Encounter: Payer: Self-pay | Admitting: *Deleted

## 2016-02-22 NOTE — Patient Outreach (Signed)
[facility] ([facility]) Care Management  02/22/2016  Ramzey F Burggraf 06/28/1950 [id number]   Transition of care  Placed transition of care call to Mr.Rudman. Discharged from [facility] on 10/8 Mr.Bress reports that he is doing  pretty good, reports that his lip is healing well. Patient discussed adjusting to being in his new apartment .  Mr.Grey reports that his weight today is 275, patient reports that he has a new walker that assist him with standing and balancing on scales.  Patient discussed his changes in medication at discharge to stop Lantus and Bydureon. His blood sugar today was 148, patient states he has not had a blood sugar over 200 or less than 70 since being home.    Patient has not had prescriptions for clindamycin or proscar filled, states he plans to get meds on 10/10. Patient was recently discharged from hospital and all medications have been reviewed.Discussed importance of getting and completing antibiotic and all medications as prescribed.   Patient agreeable to home visit as part of transition of care along with Chrystal Land , LCSW.   Mr.Ellenberger discussed he has a visit with MD at [facility] in [place] on Friday and he feels comfortable enough to drive himself  Patient has  office visit with Dr. Khan related to his pulmonary problem and CPAP.      Plan Will plan home visit in the next week as part of transition of care. Patient will make follow up appointment with PCP for post hospital visit.    [facility] CM Care Plan Problem One   Flowsheet Row Most Recent Value  Care Plan Problem One  High risk for hospital readmissin related to recent discharge   Role Documenting the Problem One  Care Management Coordinator  Care Plan for Problem One  Active  [facility] Long Term Goal (31-90 days)  Patient will not experience hospital  admission in the next 31 days   [facility] Long Term Goal Start Date  02/22/16  Interventions for Problem One Long Term Goal  Reviewed transition of care and weekly  outreaches, encouraged taking medicatons as prescribed,   [facility] CM Short Term Goal #1 (0-30 days)  Patient will make office visit with PCP in the next week.   [facility] CM Short Term Goal #1 Start Date  02/22/16  Interventions for Short Term Goal #1  Encouraged in importance of post hosptial visit with PCP  [facility] CM Short Term Goal #2 (0-30 days)  Patient will continue to monitor blood sugars twice daily in the next 30 days   [facility] CM Short Term Goal #2 Start Date  02/22/16  [facility] CM Short Term Goal #2 Met Date  01/28/16  Interventions for Short Term Goal #2  Discussed importance of checking blood sugar and notifying MD of increases in reading.   [facility] CM Short Term Goal #3 (0-30 days)  Patient wil report completing course of antibiotics in the next 5 days   [facility] CM Short Term Goal #3 Start Date  02/22/16  Interventions for Short Tern Goal #3  Discussed importance of completing full prescripiton of medication for full benefit and healing .      Kimberly Glover, RN, PCCN [facility] Care Management [phone]- Mobile [phone]- Toll Free Main Office    

## 2016-02-23 DIAGNOSIS — G4733 Obstructive sleep apnea (adult) (pediatric): Secondary | ICD-10-CM | POA: Diagnosis not present

## 2016-02-24 DIAGNOSIS — R0602 Shortness of breath: Secondary | ICD-10-CM | POA: Diagnosis not present

## 2016-02-25 DIAGNOSIS — G4733 Obstructive sleep apnea (adult) (pediatric): Secondary | ICD-10-CM | POA: Diagnosis not present

## 2016-02-25 DIAGNOSIS — M549 Dorsalgia, unspecified: Secondary | ICD-10-CM | POA: Diagnosis not present

## 2016-02-25 DIAGNOSIS — I2510 Atherosclerotic heart disease of native coronary artery without angina pectoris: Secondary | ICD-10-CM | POA: Diagnosis not present

## 2016-02-25 DIAGNOSIS — G8929 Other chronic pain: Secondary | ICD-10-CM | POA: Diagnosis not present

## 2016-02-25 DIAGNOSIS — K219 Gastro-esophageal reflux disease without esophagitis: Secondary | ICD-10-CM | POA: Diagnosis not present

## 2016-02-25 DIAGNOSIS — F329 Major depressive disorder, single episode, unspecified: Secondary | ICD-10-CM | POA: Diagnosis not present

## 2016-02-25 DIAGNOSIS — N400 Enlarged prostate without lower urinary tract symptoms: Secondary | ICD-10-CM | POA: Diagnosis not present

## 2016-02-25 DIAGNOSIS — E1140 Type 2 diabetes mellitus with diabetic neuropathy, unspecified: Secondary | ICD-10-CM | POA: Diagnosis not present

## 2016-02-26 ENCOUNTER — Ambulatory Visit: Payer: Self-pay | Admitting: *Deleted

## 2016-02-26 DIAGNOSIS — R296 Repeated falls: Secondary | ICD-10-CM | POA: Insufficient documentation

## 2016-02-29 DIAGNOSIS — I2510 Atherosclerotic heart disease of native coronary artery without angina pectoris: Secondary | ICD-10-CM | POA: Diagnosis not present

## 2016-02-29 DIAGNOSIS — M549 Dorsalgia, unspecified: Secondary | ICD-10-CM | POA: Diagnosis not present

## 2016-02-29 DIAGNOSIS — K219 Gastro-esophageal reflux disease without esophagitis: Secondary | ICD-10-CM | POA: Diagnosis not present

## 2016-02-29 DIAGNOSIS — G4733 Obstructive sleep apnea (adult) (pediatric): Secondary | ICD-10-CM | POA: Diagnosis not present

## 2016-02-29 DIAGNOSIS — E1140 Type 2 diabetes mellitus with diabetic neuropathy, unspecified: Secondary | ICD-10-CM | POA: Diagnosis not present

## 2016-02-29 DIAGNOSIS — N400 Enlarged prostate without lower urinary tract symptoms: Secondary | ICD-10-CM | POA: Diagnosis not present

## 2016-02-29 DIAGNOSIS — G8929 Other chronic pain: Secondary | ICD-10-CM | POA: Diagnosis not present

## 2016-02-29 DIAGNOSIS — F329 Major depressive disorder, single episode, unspecified: Secondary | ICD-10-CM | POA: Diagnosis not present

## 2016-03-01 DIAGNOSIS — N400 Enlarged prostate without lower urinary tract symptoms: Secondary | ICD-10-CM | POA: Diagnosis not present

## 2016-03-01 DIAGNOSIS — K219 Gastro-esophageal reflux disease without esophagitis: Secondary | ICD-10-CM | POA: Diagnosis not present

## 2016-03-01 DIAGNOSIS — F3341 Major depressive disorder, recurrent, in partial remission: Secondary | ICD-10-CM | POA: Diagnosis not present

## 2016-03-01 DIAGNOSIS — E1140 Type 2 diabetes mellitus with diabetic neuropathy, unspecified: Secondary | ICD-10-CM | POA: Diagnosis not present

## 2016-03-01 DIAGNOSIS — I2510 Atherosclerotic heart disease of native coronary artery without angina pectoris: Secondary | ICD-10-CM | POA: Diagnosis not present

## 2016-03-01 DIAGNOSIS — G4733 Obstructive sleep apnea (adult) (pediatric): Secondary | ICD-10-CM | POA: Diagnosis not present

## 2016-03-01 DIAGNOSIS — F329 Major depressive disorder, single episode, unspecified: Secondary | ICD-10-CM | POA: Diagnosis not present

## 2016-03-01 DIAGNOSIS — F411 Generalized anxiety disorder: Secondary | ICD-10-CM | POA: Diagnosis not present

## 2016-03-01 DIAGNOSIS — I10 Essential (primary) hypertension: Secondary | ICD-10-CM | POA: Diagnosis not present

## 2016-03-01 DIAGNOSIS — J45909 Unspecified asthma, uncomplicated: Secondary | ICD-10-CM | POA: Diagnosis not present

## 2016-03-01 DIAGNOSIS — N179 Acute kidney failure, unspecified: Secondary | ICD-10-CM | POA: Diagnosis not present

## 2016-03-01 DIAGNOSIS — M549 Dorsalgia, unspecified: Secondary | ICD-10-CM | POA: Diagnosis not present

## 2016-03-01 DIAGNOSIS — G8929 Other chronic pain: Secondary | ICD-10-CM | POA: Diagnosis not present

## 2016-03-01 DIAGNOSIS — E1165 Type 2 diabetes mellitus with hyperglycemia: Secondary | ICD-10-CM | POA: Diagnosis not present

## 2016-03-02 ENCOUNTER — Encounter: Payer: Self-pay | Admitting: *Deleted

## 2016-03-02 ENCOUNTER — Other Ambulatory Visit: Payer: Self-pay | Admitting: *Deleted

## 2016-03-02 DIAGNOSIS — R944 Abnormal results of kidney function studies: Secondary | ICD-10-CM | POA: Diagnosis not present

## 2016-03-02 NOTE — Patient Outreach (Signed)
[facility]) Care Management   03/02/2016  [facility] 09-Sep-1950 [id number]  [facility] is an 65 y.o. male  Subjective:  Patient reports feeling pretty good on today.  Mr.Crochet discussed his recent visit with his PCP and medication changes. Patient discussed that he had some of his blood pressure medications added back and some reduced. Patient states he is currently not monitoring is blood pressure, he has a monitor at his sister's home.    Patient states his lip is healing, and tolerating eating and drinking without any problems.   Patient states he has been started back on his bydureon, and lantus is still on hold. Patient continues to monitor blood sugar at least 3 times a week.   Patient denies any increase in swelling or shortness of breath.   Patient denies having a recurrent fall, since his discharge from [facility], he is now [facility], therapy and social has visited patient.    Objective:  BP 128/60 (BP Location: Left Arm, Patient Position: Sitting, Cuff Size: Large)   Pulse 63   Resp 18   SpO2 97%  Patient greeted [place] at door, neatly dressed, supportive shoes on .  Review of Systems  Constitutional: Negative.   HENT: Negative.   Eyes: Negative.   Respiratory: Negative.   Cardiovascular: Negative.   Gastrointestinal: Negative.   Genitourinary: Negative.   Musculoskeletal: Positive for back pain.  Skin: Negative.   Neurological: Negative.   Endo/Heme/Allergies: Negative.   Psychiatric/Behavioral: Positive for depression.    Physical Exam  Constitutional: He appears well-developed and well-nourished.  Cardiovascular: Normal rate, normal heart sounds and intact distal pulses.   Respiratory: Effort normal and breath sounds normal.  GI: Soft.  Skin: Skin is warm and dry.  Psychiatric: He has a normal mood and affect. His behavior is normal. Judgment and thought content normal.    Encounter Medications:   Outpatient  Encounter Prescriptions as of 03/02/2016  Medication Sig Note  . amLODipine (NORVASC) 5 MG tablet Take 1 tablet (5 mg total) by mouth daily.   [name] aspirin EC 81 MG tablet Take 81 mg by mouth daily.   . benazepril (LOTENSIN) 20 MG tablet Take 1 tablet (20 mg total) by mouth daily.   [name] buPROPion (WELLBUTRIN XL) 300 MG 24 hr tablet Take 300 mg by mouth daily.    . busPIRone (BUSPAR) 10 MG tablet Take 10 mg by mouth at bedtime.   . clopidogrel (PLAVIX) 75 MG tablet Take 1 tablet (75 mg total) by mouth daily.   . DULoxetine (CYMBALTA) 30 MG capsule Take 30-60 mg by mouth 2 (two) times daily. Pt takes two capsules in the morning and one at night.   . Exenatide ER (BYDUREON) 2 MG PEN Inject 2 mg into the skin once a week.   . fenofibrate 160 MG tablet Take 1 tablet (160 mg total) by mouth every evening.   . fluticasone (FLONASE) 50 MCG/ACT nasal spray Place 2 sprays into both nostrils daily.   . hydrochlorothiazide (HYDRODIURIL) 12.5 MG tablet Take 12.5 mg by mouth daily.   . insulin aspart (NOVOLOG) 100 UNIT/ML injection Inject 5 Units into the skin 3 (three) times daily with meals. (Patient taking differently: Inject into the skin 3 (three) times daily with meals. Sliding scale Blood sugar less than 140 no insulin Blood sugar less than 140- 180 3 units Blood sugar less than 181-220 4 units, 220-260 6 units, 261-320 8 units, 321-360 10 units, 361-400, 12  units, more than 400 15 units and call doctor on call)   . NARCAN 4 MG/0.1ML LIQD Take 4 mg by mouth as needed (for opioid overdose).  01/28/2016: Has on hand  . nitroGLYCERIN (NITROSTAT) 0.4 MG SL tablet Place 1 tablet (0.4 mg total) under the tongue every 5 (five) minutes x 3 doses as needed for chest pain.   [name] omega-3 acid ethyl esters (LOVAZA) 1 g capsule Take 1 capsule (1 g total) by mouth 2 (two) times daily.   . Oxcarbazepine (TRILEPTAL) 300 MG tablet Take 300-600 mg by mouth 2 (two) times daily. Pt takes one tablet in the morning and two at night.    [name] oxyCODONE (OXY IR/ROXICODONE) 5 MG immediate release tablet Take 1 tablet (5 mg total) by mouth 3 (three) times daily as needed for severe pain.   [name] oxyCODONE (OXYCONTIN) 20 mg 12 hr tablet Take 1 tablet (20 mg total) by mouth every 12 (twelve) hours.   . pantoprazole (PROTONIX) 40 MG tablet Take 40 mg by mouth daily.   . phenazopyridine (PYRIDIUM) 200 MG tablet Take 200 mg by mouth 3 (three) times daily as needed for pain.   . polyethylene glycol (MIRALAX / GLYCOLAX) packet Take 17 g by mouth daily as needed.   . pregabalin (LYRICA) 200 MG capsule Take 200 mg by mouth 3 (three) times daily. Pt takes two capsules in the morning and one at night.   . senna (SENOKOT) 8.6 MG TABS tablet Take 1 tablet (8.6 mg total) by mouth 2 (two) times daily.   . tamsulosin (FLOMAX) 0.4 MG CAPS capsule Take 0.4 mg by mouth daily after supper.    . finasteride (PROSCAR) 5 MG tablet Take 1 tablet (5 mg total) by mouth daily. (Patient not taking: Reported on 03/02/2016) 02/22/2016: Need to get prescription filled    No facility-administered encounter medications on file as of 03/02/2016.     Functional Status:   In your present state of health, do you have any difficulty performing the following activities: 03/02/2016 02/18/2016  Hearing? N N  Vision? N N  Difficulty concentrating or making decisions? N N  Walking or climbing stairs? Y Y  Dressing or bathing? N N  Doing errands, shopping? N N  Preparing Food and eating ? Y -  Using the Toilet? N -  In the past six months, have you accidently leaked urine? Y -  Do you have problems with loss of bowel control? N -  Managing your Medications? N -  Managing your Finances? N -  Housekeeping or managing your Housekeeping? Y -  Some recent data might be hidden    Fall/Depression Screening:    PHQ 2/9 Scores 10/08/2015 09/08/2015 08/12/2015 07/27/2015 07/22/2015 04/03/2015 01/20/2015  PHQ - 2 Score 3 6 6  - 4 6 6   PHQ- 9 Score 19 16 24  - 19 19 24   Exception  Documentation - - - Patient refusal - - -   Fall Risk  02/22/2016 12/08/2015 10/08/2015 09/08/2015 08/12/2015  Falls in the past year? Yes Yes Yes Yes Yes  Number falls in past yr: 2 or more 2 or more 2 or more 2 or more 2 or more  Injury with Fall? Yes No No No No  Risk Factor Category  High Fall Risk High Fall Risk High Fall Risk - High Fall Risk  Risk for fall due to : Impaired balance/gait;History of fall(s);Impaired mobility Impaired balance/gait;History of fall(s) History of fall(s);Impaired balance/gait History of fall(s);Impaired balance/gait;Impaired mobility History of fall(s);Impaired  balance/gait;Impaired mobility  Risk for fall due to (comments): - - - - -  Follow up Follow up appointment;Falls prevention discussed Falls evaluation completed;Falls prevention discussed Falls prevention discussed Falls prevention discussed Falls evaluation completed;Falls prevention discussed   Assessment:  Co visit with Chrystal Land , LCSW at patient new apartment, neat handicapped accessible  home, with safety pull alerts system. Noted unevenness of floors in areas.    Hypertension- reports making low salt food choices, not monitoring blood pressure reading currently. Will obtain his monitor from sister's, declines assistance with obtaining a monitor at this time.  Diabetes - 30 day average 154, averaging taking about 4 units of each time. Patient encouraged by weight loss. Discussed healthy choices he is making with meals, since he has more storage for foods and preparation space.  Frequent Falls - High fall risk, needed reminding about using cane/walker  at all times. Fall prevention measures discussed. Participating in home health therapy,   Heart Failure - does not monitoring weights on a regular basis, in green zone.     Plan:  Will  Continue transition of care weekly outreaches, call in the next week.   [facility] CM Care Plan Problem One   Flowsheet Row Most Recent Value  Care Plan Problem One   High risk for [facility] readmissin related to recent discharge   Role Documenting the Problem One  Care Management [place] for Problem One  Active  THN Long Term Goal (31-90 days)  Patient will not experience [facility]  admission in the next 31 days   THN Long Term Goal Start Date  02/22/16  Interventions for Problem One Long Term Goal  home visit completed., reinforced with patient to notify MD of new or worsening symptoms   THN CM Short Term Goal #1 (0-30 days)  Patient will make office visit with PCP in the next week.   THN CM Short Term Goal #1 Start Date  02/22/16  THN CM Short Term Goal #1 Met Date  03/02/16  THN CM Short Term Goal #2 (0-30 days)  Patient will continue to monitor blood sugars twice daily in the next 30 days   THN CM Short Term Goal #2 Start Date  02/22/16  [facility] CM Short Term Goal #2 Met Date  03/02/16  THN CM Short Term Goal #3 (0-30 days)  Patient wil report completing course of antibiotics in the next 5 days   THN CM Short Term Goal #3 Start Date  02/22/16  [facility] CM Short Term Goal #3 Met Date  03/02/16     Joylene Draft, RN, [place] Management [phone]- Mobile [phone]- [place]

## 2016-03-02 NOTE — Patient Outreach (Addendum)
[facility]) Care Management  [facility] Social Work  03/02/2016  [facility] 1950/11/11 [id number]  Subjective:  Patient is a 65 year old male. Per patient, he moved into his new handicapped accessible, subsidized housing  apartment. Objective:   Encounter Medications:  Outpatient Encounter Prescriptions as of 03/02/2016  Medication Sig Note  . amLODipine (NORVASC) 5 MG tablet Take 1 tablet (5 mg total) by mouth daily.   [name] aspirin EC 81 MG tablet Take 81 mg by mouth daily.   . benazepril (LOTENSIN) 20 MG tablet Take 1 tablet (20 mg total) by mouth daily.   [name] buPROPion (WELLBUTRIN XL) 300 MG 24 hr tablet Take 300 mg by mouth daily.    . busPIRone (BUSPAR) 10 MG tablet Take 10 mg by mouth at bedtime.   . clopidogrel (PLAVIX) 75 MG tablet Take 1 tablet (75 mg total) by mouth daily.   . DULoxetine (CYMBALTA) 30 MG capsule Take 30-60 mg by mouth 2 (two) times daily. Pt takes two capsules in the morning and one at night.   . fenofibrate 160 MG tablet Take 1 tablet (160 mg total) by mouth every evening.   . finasteride (PROSCAR) 5 MG tablet Take 1 tablet (5 mg total) by mouth daily. (Patient not taking: Reported on 03/02/2016) 02/22/2016: Need to get prescription filled   . fluticasone (FLONASE) 50 MCG/ACT nasal spray Place 2 sprays into both nostrils daily.   . insulin aspart (NOVOLOG) 100 UNIT/ML injection Inject 5 Units into the skin 3 (three) times daily with meals. (Patient taking differently: Inject into the skin 3 (three) times daily with meals. Sliding scale)   . NARCAN 4 MG/0.1ML LIQD Take 4 mg by mouth as needed (for opioid overdose).  01/28/2016: Has on hand  . nitroGLYCERIN (NITROSTAT) 0.4 MG SL tablet Place 1 tablet (0.4 mg total) under the tongue every 5 (five) minutes x 3 doses as needed for chest pain.   [name] omega-3 acid ethyl esters (LOVAZA) 1 g capsule Take 1 capsule (1 g total) by mouth 2 (two) times daily.   . Oxcarbazepine (TRILEPTAL) 300 MG tablet Take 300-600 mg  by mouth 2 (two) times daily. Pt takes one tablet in the morning and two at night.   [name] oxyCODONE (OXY IR/ROXICODONE) 5 MG immediate release tablet Take 1 tablet (5 mg total) by mouth 3 (three) times daily as needed for severe pain.   [name] oxyCODONE (OXYCONTIN) 20 mg 12 hr tablet Take 1 tablet (20 mg total) by mouth every 12 (twelve) hours.   . pantoprazole (PROTONIX) 40 MG tablet Take 40 mg by mouth daily.   . pregabalin (LYRICA) 200 MG capsule Take 200 mg by mouth 3 (three) times daily. Pt takes two capsules in the morning and one at night.   . senna (SENOKOT) 8.6 MG TABS tablet Take 1 tablet (8.6 mg total) by mouth 2 (two) times daily.   . tamsulosin (FLOMAX) 0.4 MG CAPS capsule Take 0.4 mg by mouth daily after supper.     No facility-administered encounter medications on file as of 03/02/2016.     Functional Status:  In your present state of health, do you have any difficulty performing the following activities: 02/18/2016 01/28/2016  Hearing? N Y  Vision? N N  Difficulty concentrating or making decisions? N Y  Walking or climbing stairs? Y Y  Dressing or bathing? N N  Doing errands, shopping? N Y  Conservation officer, nature and eating ? - Y  Using the Toilet? - N  In the past six months, have you accidently leaked urine? - Y  Do you have problems with loss of bowel control? - N  Managing your Medications? - N  Managing your Finances? - N  Housekeeping or managing your Housekeeping? - Y  Some recent data might be hidden    Fall/Depression Screening:  PHQ 2/9 Scores 10/08/2015 09/08/2015 08/12/2015 07/27/2015 07/22/2015 04/03/2015 01/20/2015  PHQ - 2 Score 3 6 6  - 4 6 6   PHQ- 9 Score 19 16 24  - 19 19 24   Exception Documentation - - - Patient refusal - - -    Assessment: Co-Visit with RNCM Landis Martins. Patient has moved into his new handicapped accessible apartment. Rent has gone up approximately $200.00.  Per patient, financially he is stable right now, however he is not sure how the rent increase will  effect his cash flow. Per patient, he will contact [facility] to arrange for a payment arrangement after receiving most recent hospital bill.  Patient has home health services through [facility] [phone]. Per patient, she has recommended therapy for which patient states that he has tried several times but does not find it beneficial.  Patient will continue to consider mental health follow up but is reluctant.  Patient's reluctance explored, benefits discussed. Patient continues to provide his own transportation to medical appointments. Per patient, his daughter also assist when she can. Patient reminded of transportation resource through [employer] as well as [employer]. Patient verbalized having no further social work needs at this time  Plan: This social worker will contact patient by phone in approximately 1 month to access for any additional community resource needs.    [name] [facility] Care Management [phone]

## 2016-03-04 DIAGNOSIS — N400 Enlarged prostate without lower urinary tract symptoms: Secondary | ICD-10-CM | POA: Diagnosis not present

## 2016-03-04 DIAGNOSIS — E1140 Type 2 diabetes mellitus with diabetic neuropathy, unspecified: Secondary | ICD-10-CM | POA: Diagnosis not present

## 2016-03-04 DIAGNOSIS — G4733 Obstructive sleep apnea (adult) (pediatric): Secondary | ICD-10-CM | POA: Diagnosis not present

## 2016-03-04 DIAGNOSIS — K219 Gastro-esophageal reflux disease without esophagitis: Secondary | ICD-10-CM | POA: Diagnosis not present

## 2016-03-04 DIAGNOSIS — I2510 Atherosclerotic heart disease of native coronary artery without angina pectoris: Secondary | ICD-10-CM | POA: Diagnosis not present

## 2016-03-04 DIAGNOSIS — F329 Major depressive disorder, single episode, unspecified: Secondary | ICD-10-CM | POA: Diagnosis not present

## 2016-03-04 DIAGNOSIS — G8929 Other chronic pain: Secondary | ICD-10-CM | POA: Diagnosis not present

## 2016-03-04 DIAGNOSIS — M549 Dorsalgia, unspecified: Secondary | ICD-10-CM | POA: Diagnosis not present

## 2016-03-07 DIAGNOSIS — I2510 Atherosclerotic heart disease of native coronary artery without angina pectoris: Secondary | ICD-10-CM | POA: Diagnosis not present

## 2016-03-07 DIAGNOSIS — E1140 Type 2 diabetes mellitus with diabetic neuropathy, unspecified: Secondary | ICD-10-CM | POA: Diagnosis not present

## 2016-03-07 DIAGNOSIS — N400 Enlarged prostate without lower urinary tract symptoms: Secondary | ICD-10-CM | POA: Diagnosis not present

## 2016-03-07 DIAGNOSIS — M549 Dorsalgia, unspecified: Secondary | ICD-10-CM | POA: Diagnosis not present

## 2016-03-07 DIAGNOSIS — G8929 Other chronic pain: Secondary | ICD-10-CM | POA: Diagnosis not present

## 2016-03-07 DIAGNOSIS — K219 Gastro-esophageal reflux disease without esophagitis: Secondary | ICD-10-CM | POA: Diagnosis not present

## 2016-03-07 DIAGNOSIS — G4733 Obstructive sleep apnea (adult) (pediatric): Secondary | ICD-10-CM | POA: Diagnosis not present

## 2016-03-07 DIAGNOSIS — F329 Major depressive disorder, single episode, unspecified: Secondary | ICD-10-CM | POA: Diagnosis not present

## 2016-03-09 ENCOUNTER — Other Ambulatory Visit: Payer: Self-pay | Admitting: *Deleted

## 2016-03-09 DIAGNOSIS — F329 Major depressive disorder, single episode, unspecified: Secondary | ICD-10-CM | POA: Diagnosis not present

## 2016-03-09 DIAGNOSIS — K219 Gastro-esophageal reflux disease without esophagitis: Secondary | ICD-10-CM | POA: Diagnosis not present

## 2016-03-09 DIAGNOSIS — N400 Enlarged prostate without lower urinary tract symptoms: Secondary | ICD-10-CM | POA: Diagnosis not present

## 2016-03-09 DIAGNOSIS — I2510 Atherosclerotic heart disease of native coronary artery without angina pectoris: Secondary | ICD-10-CM | POA: Diagnosis not present

## 2016-03-09 DIAGNOSIS — G4733 Obstructive sleep apnea (adult) (pediatric): Secondary | ICD-10-CM | POA: Diagnosis not present

## 2016-03-09 DIAGNOSIS — E1140 Type 2 diabetes mellitus with diabetic neuropathy, unspecified: Secondary | ICD-10-CM | POA: Diagnosis not present

## 2016-03-09 DIAGNOSIS — G8929 Other chronic pain: Secondary | ICD-10-CM | POA: Diagnosis not present

## 2016-03-09 DIAGNOSIS — M549 Dorsalgia, unspecified: Secondary | ICD-10-CM | POA: Diagnosis not present

## 2016-03-09 NOTE — Patient Outreach (Addendum)
[facility]) Care Management  03/09/2016  Victor [facility] 1951-04-08 [id number]   Transition of care call  Placed call to patient ,[place] information verified. Patient reports he is doing better on today, he discussed problems with nausea a vomiting on yesterday, that resolved by afternoon and denies problems on today.  Patient reports being in the green zone, no increased shortness of breath or swelling, patient not weighing daily. [name] is still followed by [facility], patient reports tolerating driving to areas close to his home. Denies recent falls.  Patient reports that is blood sugars are running in the 160 range and that is little higher than he has been running he is currently taking bydureon weekly, and has not been started back on his Lantus insulin plans to discuss with NP at office visit on 10/30.  Patient denies any new concerns.  Plan Will follow up with patient on next week as part of the transition of  care outreaches.    [facility] CM Care Plan Problem One   Flowsheet Row Most Recent Value  Care Plan Problem One  High risk for hospital readmissin related to recent discharge   Role Documenting the Problem One  Care Management [place] for Problem One  Active  THN Long Term Goal (31-90 days)  Patient will not experience hospital  admission in the next 31 days   THN Long Term Goal Start Date  02/22/16  Interventions for Problem One Long Term Goal  Reminded patient to notify MD of new or worsening symptoms sooner to arrange a earlier office visit to avoid hospital visit   THN CM Short Term Goal #1 (0-30 days)  Patient will make office visit with PCP in the next week.   THN CM Short Term Goal #1 Start Date  02/22/16  THN CM Short Term Goal #1 Met Date  03/02/16  THN CM Short Term Goal #2 (0-30 days)  Patient will continue to monitor blood sugars twice daily in the next 30 days   THN CM Short Term Goal #2 Start Date  02/22/16   [facility] CM Short Term Goal #2 Met Date  03/02/16  THN CM Short Term Goal #3 (0-30 days)  Patient wil report completing course of antibiotics in the next 5 days   THN CM Short Term Goal #3 Start Date  02/22/16  [facility] CM Short Term Goal #3 Met Date  03/02/16     Joylene Draft, RN, [place] Management [phone]- Mobile [phone]- [place]

## 2016-03-11 DIAGNOSIS — R262 Difficulty in walking, not elsewhere classified: Secondary | ICD-10-CM | POA: Diagnosis not present

## 2016-03-11 DIAGNOSIS — I2510 Atherosclerotic heart disease of native coronary artery without angina pectoris: Secondary | ICD-10-CM | POA: Diagnosis not present

## 2016-03-11 DIAGNOSIS — E1140 Type 2 diabetes mellitus with diabetic neuropathy, unspecified: Secondary | ICD-10-CM | POA: Diagnosis not present

## 2016-03-11 DIAGNOSIS — N400 Enlarged prostate without lower urinary tract symptoms: Secondary | ICD-10-CM | POA: Diagnosis not present

## 2016-03-11 DIAGNOSIS — G464 Cerebellar stroke syndrome: Secondary | ICD-10-CM | POA: Diagnosis not present

## 2016-03-11 DIAGNOSIS — M549 Dorsalgia, unspecified: Secondary | ICD-10-CM | POA: Diagnosis not present

## 2016-03-11 DIAGNOSIS — M6281 Muscle weakness (generalized): Secondary | ICD-10-CM | POA: Diagnosis not present

## 2016-03-11 DIAGNOSIS — C44319 Basal cell carcinoma of skin of other parts of face: Secondary | ICD-10-CM | POA: Diagnosis not present

## 2016-03-11 DIAGNOSIS — F329 Major depressive disorder, single episode, unspecified: Secondary | ICD-10-CM | POA: Diagnosis not present

## 2016-03-11 DIAGNOSIS — G8929 Other chronic pain: Secondary | ICD-10-CM | POA: Diagnosis not present

## 2016-03-11 DIAGNOSIS — K219 Gastro-esophageal reflux disease without esophagitis: Secondary | ICD-10-CM | POA: Diagnosis not present

## 2016-03-11 DIAGNOSIS — G4733 Obstructive sleep apnea (adult) (pediatric): Secondary | ICD-10-CM | POA: Diagnosis not present

## 2016-03-14 DIAGNOSIS — N179 Acute kidney failure, unspecified: Secondary | ICD-10-CM | POA: Diagnosis not present

## 2016-03-14 DIAGNOSIS — K219 Gastro-esophageal reflux disease without esophagitis: Secondary | ICD-10-CM | POA: Diagnosis not present

## 2016-03-14 DIAGNOSIS — I10 Essential (primary) hypertension: Secondary | ICD-10-CM | POA: Diagnosis not present

## 2016-03-14 DIAGNOSIS — J45909 Unspecified asthma, uncomplicated: Secondary | ICD-10-CM | POA: Diagnosis not present

## 2016-03-14 DIAGNOSIS — F3341 Major depressive disorder, recurrent, in partial remission: Secondary | ICD-10-CM | POA: Diagnosis not present

## 2016-03-14 DIAGNOSIS — E1151 Type 2 diabetes mellitus with diabetic peripheral angiopathy without gangrene: Secondary | ICD-10-CM | POA: Diagnosis not present

## 2016-03-15 DIAGNOSIS — G4733 Obstructive sleep apnea (adult) (pediatric): Secondary | ICD-10-CM | POA: Diagnosis not present

## 2016-03-15 DIAGNOSIS — E1140 Type 2 diabetes mellitus with diabetic neuropathy, unspecified: Secondary | ICD-10-CM | POA: Diagnosis not present

## 2016-03-15 DIAGNOSIS — N400 Enlarged prostate without lower urinary tract symptoms: Secondary | ICD-10-CM | POA: Diagnosis not present

## 2016-03-15 DIAGNOSIS — I2510 Atherosclerotic heart disease of native coronary artery without angina pectoris: Secondary | ICD-10-CM | POA: Diagnosis not present

## 2016-03-15 DIAGNOSIS — M549 Dorsalgia, unspecified: Secondary | ICD-10-CM | POA: Diagnosis not present

## 2016-03-15 DIAGNOSIS — F329 Major depressive disorder, single episode, unspecified: Secondary | ICD-10-CM | POA: Diagnosis not present

## 2016-03-15 DIAGNOSIS — K219 Gastro-esophageal reflux disease without esophagitis: Secondary | ICD-10-CM | POA: Diagnosis not present

## 2016-03-15 DIAGNOSIS — G8929 Other chronic pain: Secondary | ICD-10-CM | POA: Diagnosis not present

## 2016-03-16 ENCOUNTER — Other Ambulatory Visit: Payer: Self-pay | Admitting: *Deleted

## 2016-03-16 NOTE — Patient Outreach (Signed)
[facility]) Care Management  03/16/2016  MCKINNON GLICK [birth year] [id number]   Transition of care call   Spoke with patient, HIPAA information verified patient reports that he is doing fairly well on today. Patient states he had a good report at his doctor visit on this week, reports his A1c is at 7.0 and he is very pleased, he also discussed his kidney function has improved.  Mr.Kundert reports his has been started back on his Lantus insulin at a lower dose of 20 units daily, also his Norvasc is back to 10 mg and his benzepril back up to 40 mg, patient reports tolerating medication changes. Discussed with patient whether is has been able to get his blood pressure monitor from his sister 's home , he states not yet, but hopefully he will be able to soon. Patient discussed that he has good control of trying to eat the right foods, avoiding fried foods.  Patient states he has not started to weigh yet on his scales at home, "just doesn't get to it sometimes" Patient is still followed by home health physical therapy and has visit planned for today patient hoping to walk outside, reports tolerating therapy and increased activity, patient continues to tolerate driving denies dizziness, no falls, reinforced use of his cane/walker.  Discussed with patient [employer] behavorial health consult, states he has spoken with them on the phone and he does not see the need of services because his has talked  to several counselors in the past and didn't see the need. I asked patient if they would be talking with them by phone again, he states he told them no.  Patient is nearing final transition of care call, discussed telephonic  health coach program for continued education and management as an option after transition of care patient in agreement .   Plan Will plan final transition of care call on next week, if no new care management concerns will plan transition to health coach. Will discuss patient  conversation regarding behavorial health program  with Chystal Land , LCSW [facility]    [facility] CM Care Plan Problem One   Flowsheet Row Most Recent Value  Care Plan Problem One  High risk for hospital readmissin related to recent discharge   Role Documenting the Problem One  Care Management [place] for Problem One  Active  THN Long Term Goal (31-90 days)  Patient will not experience hospital  admission in the next 31 days   THN Long Term Goal Start Date  02/22/16  Interventions for Problem One Long Term Goal  Discussed with patient to continue to follow new medication instruction and notify MD of new concerns   THN CM Short Term Goal #1 (0-30 days)  Patient will make office visit with PCP in the next week.   THN CM Short Term Goal #1 Start Date  02/22/16  THN CM Short Term Goal #1 Met Date  03/02/16  THN CM Short Term Goal #2 (0-30 days)  Patient will continue to monitor blood sugars twice daily in the next 30 days   THN CM Short Term Goal #2 Start Date  02/22/16  [facility] CM Short Term Goal #2 Met Date  03/02/16  THN CM Short Term Goal #3 (0-30 days)  Patient wil report completing course of antibiotics in the next 5 days   THN CM Short Term Goal #3 Start Date  02/22/16  [facility] CM Short Term Goal #3 Met Date  03/02/16  Joylene Draft, RN, [place] Management [phone]- Mobile [phone]- Toll Free Main Office

## 2016-03-18 DIAGNOSIS — M549 Dorsalgia, unspecified: Secondary | ICD-10-CM | POA: Diagnosis not present

## 2016-03-18 DIAGNOSIS — N400 Enlarged prostate without lower urinary tract symptoms: Secondary | ICD-10-CM | POA: Diagnosis not present

## 2016-03-18 DIAGNOSIS — G8929 Other chronic pain: Secondary | ICD-10-CM | POA: Diagnosis not present

## 2016-03-18 DIAGNOSIS — I2510 Atherosclerotic heart disease of native coronary artery without angina pectoris: Secondary | ICD-10-CM | POA: Diagnosis not present

## 2016-03-18 DIAGNOSIS — E1140 Type 2 diabetes mellitus with diabetic neuropathy, unspecified: Secondary | ICD-10-CM | POA: Diagnosis not present

## 2016-03-18 DIAGNOSIS — K219 Gastro-esophageal reflux disease without esophagitis: Secondary | ICD-10-CM | POA: Diagnosis not present

## 2016-03-18 DIAGNOSIS — F329 Major depressive disorder, single episode, unspecified: Secondary | ICD-10-CM | POA: Diagnosis not present

## 2016-03-18 DIAGNOSIS — G4733 Obstructive sleep apnea (adult) (pediatric): Secondary | ICD-10-CM | POA: Diagnosis not present

## 2016-03-21 DIAGNOSIS — R001 Bradycardia, unspecified: Secondary | ICD-10-CM | POA: Insufficient documentation

## 2016-03-21 DIAGNOSIS — I110 Hypertensive heart disease with heart failure: Secondary | ICD-10-CM | POA: Diagnosis not present

## 2016-03-21 DIAGNOSIS — R55 Syncope and collapse: Secondary | ICD-10-CM | POA: Diagnosis not present

## 2016-03-21 DIAGNOSIS — I2510 Atherosclerotic heart disease of native coronary artery without angina pectoris: Secondary | ICD-10-CM | POA: Diagnosis not present

## 2016-03-23 ENCOUNTER — Other Ambulatory Visit: Payer: Self-pay | Admitting: *Deleted

## 2016-03-23 DIAGNOSIS — G4733 Obstructive sleep apnea (adult) (pediatric): Secondary | ICD-10-CM | POA: Diagnosis not present

## 2016-03-23 DIAGNOSIS — F329 Major depressive disorder, single episode, unspecified: Secondary | ICD-10-CM | POA: Diagnosis not present

## 2016-03-23 DIAGNOSIS — G8929 Other chronic pain: Secondary | ICD-10-CM | POA: Diagnosis not present

## 2016-03-23 DIAGNOSIS — K219 Gastro-esophageal reflux disease without esophagitis: Secondary | ICD-10-CM | POA: Diagnosis not present

## 2016-03-23 DIAGNOSIS — N400 Enlarged prostate without lower urinary tract symptoms: Secondary | ICD-10-CM | POA: Diagnosis not present

## 2016-03-23 DIAGNOSIS — E1140 Type 2 diabetes mellitus with diabetic neuropathy, unspecified: Secondary | ICD-10-CM | POA: Diagnosis not present

## 2016-03-23 DIAGNOSIS — M549 Dorsalgia, unspecified: Secondary | ICD-10-CM | POA: Diagnosis not present

## 2016-03-23 DIAGNOSIS — I2510 Atherosclerotic heart disease of native coronary artery without angina pectoris: Secondary | ICD-10-CM | POA: Diagnosis not present

## 2016-03-23 NOTE — Patient Outreach (Signed)
[facility]) Care Management  03/23/2016  Victor Wise 1950/11/25 [id number]   Final transition of care call.  Placed call to patient as part of transition of care program,Victor Wise reports that he is doing just fine.He discussed that physical therapy visited on today and that will be his last visit as patient has met all the goals. Patient denies having a recent fall and continues to use his cane/walker.  Patient discussed that he is taking all of his medication as prescribed denies having any difficulty. Patient reports his blood sugar is 159 on today, denies any episodes of hypoglycemia and able to states protocol for treating. Patient discussed how he wanted to continue to keep his A1c down last reported reading 6.9.  Patient denies shortness, increased swelling . Patient reports his recent weight is 285 at MD visit on Monday, he has not started to weigh on scales at home yet but plans to . Patient states he understands to notify MD of increased swelling, shortness .   Patient has successfully completed transition of care without readmission, will follow up as community care management due to high risk for readmission patient agreeable. Patient has contact information if new concerns arise.   Plan Will follow up with patient in the next month as community care and if no further care coordination needs to be addressed, will transition to health coach as discussed with patient and he has agreed for continued education and management of chronic disease. Collaboration and goal setting.  [facility] CM Care Plan Problem One   Flowsheet Row Most Recent Value  Care Plan Problem One  High risk for hospital readmissin related to recent discharge   Role Documenting the Problem One  Care Management [place] for Problem One  Not Active  [facility] Long Term Goal (31-90 days)  Patient will not experience hospital  admission in the next 31 days   [facility] Long Term Goal Start Date  02/22/16   [facility] Long Term Goal Met Date  03/23/16  [facility] CM Short Term Goal #1 (0-30 days)  Patient will make office visit with PCP in the next week.   [facility] CM Short Term Goal #1 Start Date  02/22/16  [facility] CM Short Term Goal #1 Met Date  03/02/16  [facility] CM Short Term Goal #2 (0-30 days)  Patient will continue to monitor blood sugars twice daily in the next 30 days   [facility] CM Short Term Goal #2 Start Date  02/22/16  [facility] CM Short Term Goal #2 Met Date  03/02/16  [facility] CM Short Term Goal #3 (0-30 days)  Patient wil report completing course of antibiotics in the next 5 days   [facility] CM Short Term Goal #3 Start Date  02/22/16  [facility] CM Short Term Goal #3 Met Date  03/02/16    [facility] CM Care Plan Problem Two   Flowsheet Row Most Recent Value  Care Plan Problem Two  High Risk for readmission related to recent readmission   Role Documenting the Problem Two  Care Management Coordinator  Care Plan for Problem Two  Active  [facility] CM Short Term Goal #1 (0-30 days)  Patient will not experience a hospital admission in the next 30 days   [facility] CM Short Term Goal #1 Start Date  03/23/16  Interventions for Short Term Goal #2   Discussed importance of taking medications as prescribed, notifying MD of new or worsening symptoms of shortness of breath, swelling, recurrent falls   [facility] CM Short  Term Goal #2 (0-30 days)  Patient will report completing exercises taught by physical therapy at least 3 days a week  [facility] CM Short Term Goal #2 Start Date  03/23/16  Interventions for Short Term Goal #2  Discussed the importance of continued exercise and activity to increase strenght and balance to help prevent falls   [facility] CM Short Term Goal #3 (0-30 days)  Patient will increase knowledge adhering to low sodium diet in the next 30 days   [facility] CM Short Term Goal #3 Start Date  03/23/16  Interventions for Short Term Goal #3  Reviewed with patient foods lower in salt to include in diet, and foods higher in salt to limit.  [facility] CM Short Term Goal #4 (0-30 days)   Patient will report continuing to monitor and record blood sugars in the next 30 days   [facility] CM Short Term Goal #4 Start Date  03/23/16  Interventions of Short Term Goal #4  Encouraged to continue to monitor and keep a log of blood sugar and notify MD of consistent episodes of hypoglycemia       Joylene Draft, RN, [place] [facility] [phone]- Mobile [phone]- [place]

## 2016-03-25 DIAGNOSIS — I2510 Atherosclerotic heart disease of native coronary artery without angina pectoris: Secondary | ICD-10-CM | POA: Diagnosis not present

## 2016-03-25 DIAGNOSIS — M549 Dorsalgia, unspecified: Secondary | ICD-10-CM | POA: Diagnosis not present

## 2016-03-25 DIAGNOSIS — E1140 Type 2 diabetes mellitus with diabetic neuropathy, unspecified: Secondary | ICD-10-CM | POA: Diagnosis not present

## 2016-03-25 DIAGNOSIS — G4733 Obstructive sleep apnea (adult) (pediatric): Secondary | ICD-10-CM | POA: Diagnosis not present

## 2016-03-25 DIAGNOSIS — G8929 Other chronic pain: Secondary | ICD-10-CM | POA: Diagnosis not present

## 2016-03-25 DIAGNOSIS — N400 Enlarged prostate without lower urinary tract symptoms: Secondary | ICD-10-CM | POA: Diagnosis not present

## 2016-03-25 DIAGNOSIS — K219 Gastro-esophageal reflux disease without esophagitis: Secondary | ICD-10-CM | POA: Diagnosis not present

## 2016-03-25 DIAGNOSIS — F329 Major depressive disorder, single episode, unspecified: Secondary | ICD-10-CM | POA: Diagnosis not present

## 2016-03-31 ENCOUNTER — Other Ambulatory Visit: Payer: Self-pay | Admitting: *Deleted

## 2016-03-31 DIAGNOSIS — E1140 Type 2 diabetes mellitus with diabetic neuropathy, unspecified: Secondary | ICD-10-CM | POA: Diagnosis not present

## 2016-03-31 DIAGNOSIS — M549 Dorsalgia, unspecified: Secondary | ICD-10-CM | POA: Diagnosis not present

## 2016-03-31 DIAGNOSIS — F329 Major depressive disorder, single episode, unspecified: Secondary | ICD-10-CM | POA: Diagnosis not present

## 2016-03-31 DIAGNOSIS — K219 Gastro-esophageal reflux disease without esophagitis: Secondary | ICD-10-CM | POA: Diagnosis not present

## 2016-03-31 DIAGNOSIS — I2510 Atherosclerotic heart disease of native coronary artery without angina pectoris: Secondary | ICD-10-CM | POA: Diagnosis not present

## 2016-03-31 DIAGNOSIS — G8929 Other chronic pain: Secondary | ICD-10-CM | POA: Diagnosis not present

## 2016-03-31 DIAGNOSIS — N400 Enlarged prostate without lower urinary tract symptoms: Secondary | ICD-10-CM | POA: Diagnosis not present

## 2016-03-31 DIAGNOSIS — G4733 Obstructive sleep apnea (adult) (pediatric): Secondary | ICD-10-CM | POA: Diagnosis not present

## 2016-03-31 NOTE — Patient Outreach (Signed)
[facility] ([facility]) Care Management  03/31/2016  Victor Wise 06/23/1950 [id number]   Phone call to patient to assess  for continued community resource needs and social work involvement.  Per patient, he has been discharged from home health [facility] as he has met his goals.  Patient continues to reside in his apartment and is doing well. Per patient, he continues to decline referral for mental health services stating that he has already tried therapy and does not feel it is effective.  Patient denies having current thoughts of  harm to himself or others. Patient continues to have the support of his daughter  and states that his sister came by to visit him in his new apartment. Per patient, he is glad that their relationship has not been totally severed and that there is some contact since his move.  Patient reports having enough food supplies and states that due to his move, he has to be careful to budget out his money to cover his expenses.  Patient has no firm plans for the holiday's, however does plan to spend time with his grandchildren and daughter. Patient encouraged to continue to utilize his assistive devices to avoid falls. Patient denied any continued community resource needs. Patient agreed with case closure to social work at this time. Patient encouraged to call this social work if any social work needs arise in the future. Social work case closure letter to be sent to patient's provider. RNCM Kim Glover to be notified of case closure as well.     , LCSW [facility] Care Management [phone]  

## 2016-04-02 ENCOUNTER — Encounter: Payer: Self-pay | Admitting: *Deleted

## 2016-04-05 DIAGNOSIS — I5032 Chronic diastolic (congestive) heart failure: Secondary | ICD-10-CM | POA: Diagnosis not present

## 2016-04-11 DIAGNOSIS — C44319 Basal cell carcinoma of skin of other parts of face: Secondary | ICD-10-CM | POA: Diagnosis not present

## 2016-04-11 DIAGNOSIS — R262 Difficulty in walking, not elsewhere classified: Secondary | ICD-10-CM | POA: Diagnosis not present

## 2016-04-11 DIAGNOSIS — G464 Cerebellar stroke syndrome: Secondary | ICD-10-CM | POA: Diagnosis not present

## 2016-04-11 DIAGNOSIS — M6281 Muscle weakness (generalized): Secondary | ICD-10-CM | POA: Diagnosis not present

## 2016-04-12 DIAGNOSIS — W01198D Fall on same level from slipping, tripping and stumbling with subsequent striking against other object, subsequent encounter: Secondary | ICD-10-CM | POA: Diagnosis not present

## 2016-04-12 DIAGNOSIS — Z0001 Encounter for general adult medical examination with abnormal findings: Secondary | ICD-10-CM | POA: Diagnosis not present

## 2016-04-12 DIAGNOSIS — F3341 Major depressive disorder, recurrent, in partial remission: Secondary | ICD-10-CM | POA: Diagnosis not present

## 2016-04-12 DIAGNOSIS — E1151 Type 2 diabetes mellitus with diabetic peripheral angiopathy without gangrene: Secondary | ICD-10-CM | POA: Diagnosis not present

## 2016-04-12 DIAGNOSIS — I10 Essential (primary) hypertension: Secondary | ICD-10-CM | POA: Diagnosis not present

## 2016-04-13 ENCOUNTER — Other Ambulatory Visit: Payer: Self-pay | Admitting: Nurse Practitioner

## 2016-04-13 ENCOUNTER — Ambulatory Visit
Admission: RE | Admit: 2016-04-13 | Discharge: 2016-04-13 | Disposition: A | Discharge status: Home / Self Care | Payer: Commercial Managed Care - HMO | Source: Ambulatory Visit | Attending: Nurse Practitioner | Admitting: Nurse Practitioner

## 2016-04-13 DIAGNOSIS — R42 Dizziness and giddiness: Secondary | ICD-10-CM | POA: Diagnosis not present

## 2016-04-13 DIAGNOSIS — S0990XA Unspecified injury of head, initial encounter: Secondary | ICD-10-CM | POA: Diagnosis not present

## 2016-04-13 DIAGNOSIS — G319 Degenerative disease of nervous system, unspecified: Secondary | ICD-10-CM | POA: Diagnosis not present

## 2016-04-21 DIAGNOSIS — S42402D Unspecified fracture of lower end of left humerus, subsequent encounter for fracture with routine healing: Secondary | ICD-10-CM | POA: Diagnosis not present

## 2016-04-21 DIAGNOSIS — J45909 Unspecified asthma, uncomplicated: Secondary | ICD-10-CM | POA: Diagnosis not present

## 2016-04-21 DIAGNOSIS — E119 Type 2 diabetes mellitus without complications: Secondary | ICD-10-CM | POA: Diagnosis not present

## 2016-04-21 DIAGNOSIS — Z48812 Encounter for surgical aftercare following surgery on the circulatory system: Secondary | ICD-10-CM | POA: Diagnosis not present

## 2016-04-24 DIAGNOSIS — G4733 Obstructive sleep apnea (adult) (pediatric): Secondary | ICD-10-CM | POA: Diagnosis not present

## 2016-04-25 ENCOUNTER — Other Ambulatory Visit: Payer: Self-pay | Admitting: *Deleted

## 2016-04-25 NOTE — Patient Outreach (Signed)
[facility]) Care Management  04/25/2016  Victor Wise [birth year] [id number]   Follow up telephone call  Placed call to patient for monthly follow up. Patient discussed that he is doing fairly well. Patient discussed reports fall about 2 weeks ago , reports he fell while going into a store states he tripped on door seal his shoe came off, he fell and hit his head. Patient report he notified his doctor and had office visit , CT of head has been completed and patient states no changes from previous one. Patient has scheduled visit with neurologist, previously scheduled and will discuss with him as he continues to have some dizziness at times.  No falls since incident, fall prevention safety measures reviewed.    Patient states he has driven once since the fall and tolerated well.   [name] discussed his blood sugars have been running good 140 range , no hypoglycemic episodes. [name] discussed that his blood pressure usually runs okay at office visits, but I need to know what it is at home, but does not have a monitor, as he has not been able to find the one he had while at his sister's requested assistance with this need.  Patient discussed he has follow up visit with Dr.Shah on tomorrow and visit with his pain doctor on Wednesday and he plans to drive himself, states he hasn't been told not to drive and feels he will be okay, patient states his daughter [name] works on Wednesday. Encouraged patient if he feels dizzy  not drive to be safe. Discussed changing appointment to help explore options for transportation.    Plan Will schedule home visit for this month Will assess [facility] resources for assistance with blood pressure monitor.  Patient care goals and plan reviewed.   Joylene Draft, RN, [place] Management [phone]- Mobile [phone]- Toll Free Main Office

## 2016-04-26 DIAGNOSIS — R42 Dizziness and giddiness: Secondary | ICD-10-CM | POA: Diagnosis not present

## 2016-04-26 DIAGNOSIS — E669 Obesity, unspecified: Secondary | ICD-10-CM | POA: Diagnosis not present

## 2016-04-26 DIAGNOSIS — S59902S Unspecified injury of left elbow, sequela: Secondary | ICD-10-CM | POA: Diagnosis not present

## 2016-04-26 DIAGNOSIS — I69354 Hemiplegia and hemiparesis following cerebral infarction affecting left non-dominant side: Secondary | ICD-10-CM | POA: Diagnosis not present

## 2016-04-26 DIAGNOSIS — G4733 Obstructive sleep apnea (adult) (pediatric): Secondary | ICD-10-CM | POA: Diagnosis not present

## 2016-05-02 DIAGNOSIS — G2581 Restless legs syndrome: Secondary | ICD-10-CM | POA: Diagnosis not present

## 2016-05-02 DIAGNOSIS — R0602 Shortness of breath: Secondary | ICD-10-CM | POA: Diagnosis not present

## 2016-05-02 DIAGNOSIS — G4733 Obstructive sleep apnea (adult) (pediatric): Secondary | ICD-10-CM | POA: Diagnosis not present

## 2016-05-09 DIAGNOSIS — I69354 Hemiplegia and hemiparesis following cerebral infarction affecting left non-dominant side: Secondary | ICD-10-CM | POA: Insufficient documentation

## 2016-05-11 ENCOUNTER — Other Ambulatory Visit: Payer: Self-pay | Admitting: *Deleted

## 2016-05-11 DIAGNOSIS — R262 Difficulty in walking, not elsewhere classified: Secondary | ICD-10-CM | POA: Diagnosis not present

## 2016-05-11 DIAGNOSIS — G464 Cerebellar stroke syndrome: Secondary | ICD-10-CM | POA: Diagnosis not present

## 2016-05-11 DIAGNOSIS — C44319 Basal cell carcinoma of skin of other parts of face: Secondary | ICD-10-CM | POA: Diagnosis not present

## 2016-05-11 DIAGNOSIS — M6281 Muscle weakness (generalized): Secondary | ICD-10-CM | POA: Diagnosis not present

## 2016-05-11 NOTE — Patient Outreach (Addendum)
[place] [facility]) Care Management   05/11/2016  Victor Wise 12-24-1950 [id number]  Victor Wise is an 65 y.o. male  Subjective:  Patient discussed that he is still having problems with dizziness when he first stands up  reports having 2 falls at home tripped when getting up and shoe came off causing to him to fall  Patient reports he is paying for medical alert system monthly , but the monitoring necklace is at his sister's.  Patient reports he continues to monitor blood sugar daily, does not keep a record. Patient discussed he is eating more and putting on some weight. Patient discussed he is eating more out of boredom.   Patient reports that he weighs a few days a week, no swelling or shortness of breath, no sudden weight gains reported in a week,he does not keep a written record.    Objective:  BP 128/70 (BP Location: Left Arm)   Pulse 60   Resp 18   Wt 295 lb (133.8 kg) Comment: patient reported  SpO2 93%   BMI 43.88 kg/m   Patient noted with supportive shoes on, using cane during home visit.  Review of Systems  Constitutional: Negative.   HENT: Negative.   Eyes: Negative.   Respiratory: Negative.   Cardiovascular: Negative.   Gastrointestinal: Negative.   Genitourinary: Negative.   Musculoskeletal: Positive for falls.  Skin: Negative.   Neurological: Positive for dizziness.  Endo/Heme/Allergies: Negative.   Psychiatric/Behavioral: Positive for depression. Negative for suicidal ideas.    Physical Exam  Constitutional: He is oriented to person, place, and time. He appears well-developed and well-nourished.  Cardiovascular: Normal rate, normal heart sounds and intact distal pulses.   Respiratory: Effort normal and breath sounds normal.  GI: Soft.  Neurological: He is alert and oriented to person, place, and time.  Skin: Skin is warm and dry.  Psychiatric: His behavior is normal. Judgment and thought content normal.  Complaint of boredom    Encounter  Medications:   Outpatient Encounter Prescriptions as of 05/11/2016  Medication Sig Note  . amLODipine (NORVASC) 5 MG tablet Take 1 tablet (5 mg total) by mouth daily. (Patient taking differently: Take 10 mg by mouth daily. )   . aspirin EC 81 MG tablet Take 81 mg by mouth daily.   . benazepril (LOTENSIN) 20 MG tablet Take 1 tablet (20 mg total) by mouth daily. (Patient taking differently: Take 40 mg by mouth daily. )   . buPROPion (WELLBUTRIN XL) 300 MG 24 hr tablet Take 300 mg by mouth daily.    . busPIRone (BUSPAR) 10 MG tablet Take 10 mg by mouth at bedtime.   . clopidogrel (PLAVIX) 75 MG tablet Take 1 tablet (75 mg total) by mouth daily.   . DULoxetine (CYMBALTA) 30 MG capsule Take 30-60 mg by mouth 2 (two) times daily. Pt takes two capsules in the morning and one at night.   . Exenatide ER (BYDUREON) 2 MG PEN Inject 2 mg into the skin once a week.   . fenofibrate 160 MG tablet Take 1 tablet (160 mg total) by mouth every evening.   . fluticasone (FLONASE) 50 MCG/ACT nasal spray Place 2 sprays into both nostrils daily.   . hydrochlorothiazide (HYDRODIURIL) 12.5 MG tablet Take 12.5 mg by mouth daily.   . insulin aspart (NOVOLOG) 100 UNIT/ML injection Inject 5 Units into the skin 3 (three) times daily with meals. (Patient taking differently: Inject into the skin 3 (three) times daily with meals. Sliding scale Blood  sugar less than 140 no insulin Blood sugar less than 140- 180 3 units Blood sugar less than 181-220 4 units, 220-260 6 units, 261-320 8 units, 321-360 10 units, 361-400, 12 units, more than 400 15 units and call doctor on call)   . NARCAN 4 MG/0.1ML LIQD Take 4 mg by mouth as needed (for opioid overdose).  01/28/2016: Has on hand  . nitroGLYCERIN (NITROSTAT) 0.4 MG SL tablet Place 1 tablet (0.4 mg total) under the tongue every 5 (five) minutes x 3 doses as needed for chest pain.   [name] omega-3 acid ethyl esters (LOVAZA) 1 g capsule Take 1 capsule (1 g total) by mouth 2 (two) times daily.    . Oxcarbazepine (TRILEPTAL) 300 MG tablet Take 300-600 mg by mouth 2 (two) times daily. Pt takes one tablet in the morning and two at night.   [name] oxyCODONE (OXY IR/ROXICODONE) 5 MG immediate release tablet Take 1 tablet (5 mg total) by mouth 3 (three) times daily as needed for severe pain.   [name] oxyCODONE (OXYCONTIN) 20 mg 12 hr tablet Take 1 tablet (20 mg total) by mouth every 12 (twelve) hours.   . pantoprazole (PROTONIX) 40 MG tablet Take 40 mg by mouth daily.   . phenazopyridine (PYRIDIUM) 200 MG tablet Take 200 mg by mouth 3 (three) times daily as needed for pain.   . polyethylene glycol (MIRALAX / GLYCOLAX) packet Take 17 g by mouth daily as needed.   . pregabalin (LYRICA) 200 MG capsule Take 200 mg by mouth 3 (three) times daily. Pt takes two capsules in the morning and one at night.   . senna (SENOKOT) 8.6 MG TABS tablet Take 1 tablet (8.6 mg total) by mouth 2 (two) times daily.   . tamsulosin (FLOMAX) 0.4 MG CAPS capsule Take 0.4 mg by mouth daily after supper.    . finasteride (PROSCAR) 5 MG tablet Take 1 tablet (5 mg total) by mouth daily. (Patient not taking: Reported on 05/11/2016)    No facility-administered encounter medications on file as of 05/11/2016.     Functional Status:   In your present state of health, do you have any difficulty performing the following activities: 03/02/2016 02/18/2016  Hearing? N N  Vision? N N  Difficulty concentrating or making decisions? N N  Walking or climbing stairs? Y Y  Dressing or bathing? N N  Doing errands, shopping? N N  Preparing Food and eating ? Y -  Using the Toilet? N -  In the past six months, have you accidently leaked urine? Y -  Do you have problems with loss of bowel control? N -  Managing your Medications? N -  Managing your Finances? N -  Housekeeping or managing your Housekeeping? Y -  Some recent data might be hidden    Fall/Depression Screening:    PHQ 2/9 Scores 10/08/2015 09/08/2015 08/12/2015 07/27/2015 07/22/2015  04/03/2015 01/20/2015  PHQ - 2 Score 3 6 6  - 4 6 6   PHQ- 9 Score 19 16 24  - 19 19 24   Exception Documentation - - - Patient refusal - - -   Fall Risk  05/11/2016 02/22/2016 12/08/2015 10/08/2015 09/08/2015  Falls in the past year? Yes Yes Yes Yes Yes  Number falls in past yr: 2 or more 2 or more 2 or more 2 or more 2 or more  Injury with Fall? Yes Yes No No No  Risk Factor Category  High Fall Risk High Fall Risk High Fall Risk High Fall Risk -  Risk  for fall due to : Impaired balance/gait;History of fall(s) Impaired balance/gait;History of fall(s);Impaired mobility Impaired balance/gait;History of fall(s) History of fall(s);Impaired balance/gait History of fall(s);Impaired balance/gait;Impaired mobility  Risk for fall due to (comments): - - - - -  Follow up Education provided;Falls prevention discussed Follow up appointment;Falls prevention discussed Falls evaluation completed;Falls prevention discussed Falls prevention discussed Falls prevention discussed   Assessment:    Falls- Still having falls at home, 2 falls in the last 3 weeks no injury, still complaints of dizziness when changing positions. Patient declines need for therapy at this time. Would benefit from exercise program, discussed arranging transportation patient reports he prefers  working on exercises he has been taught by therapist at home. Patient keeps cell phone in pocket at home to call for assistance , his bathroom is equipped with safety pull alarm.   Heart failure - in green zone, weighing at least twice weekly , weight up to 296 patient states it is not fluid, states he is eating more. States he does not add salt to food.  Reinforced Heart zone information. Weights   Diabetes - 30 day average 168 , patient reports he feels like he is eating more due to boredom. Will benefit from healthy snack options education. No hypoglycemic episodes able to state treatment for hypoglycemic episode.Patient states he knows what is right for him  to eat.   Hypertension - Patient declined self monitoring of blood pressures at home.   Depression - complaint of loneliness, declines attending senior social gatherings.  Declines [facility] telephonic program or outpatient counseling states he has been there done that and its all the same, I know it all it just doesn't work.    Plan:  Will send this visit note to MD and plan return home visit in next month. Patient will call MD for increased falls or injury. Provided and reviewed EMMI handout of hypertension. Provided and reviewed Heart Failure zone information, patient able to restate yellow zone symptoms and action plan. Reviewed healthy snack options for diet. Will discuss with [facility] LCSW other options for patient support for socialization.   Joylene Draft, RN, [place] Management [phone]- Mobile [phone]- Toll Free Main Office

## 2016-05-25 DIAGNOSIS — G4733 Obstructive sleep apnea (adult) (pediatric): Secondary | ICD-10-CM | POA: Diagnosis not present

## 2016-05-26 DIAGNOSIS — M7581 Other shoulder lesions, right shoulder: Secondary | ICD-10-CM | POA: Diagnosis not present

## 2016-05-26 DIAGNOSIS — E1165 Type 2 diabetes mellitus with hyperglycemia: Secondary | ICD-10-CM | POA: Diagnosis not present

## 2016-05-26 DIAGNOSIS — J45909 Unspecified asthma, uncomplicated: Secondary | ICD-10-CM | POA: Diagnosis not present

## 2016-05-26 DIAGNOSIS — I10 Essential (primary) hypertension: Secondary | ICD-10-CM | POA: Diagnosis not present

## 2016-05-26 DIAGNOSIS — I2584 Coronary atherosclerosis due to calcified coronary lesion: Secondary | ICD-10-CM | POA: Diagnosis not present

## 2016-06-06 ENCOUNTER — Encounter: Payer: Self-pay | Admitting: *Deleted

## 2016-06-06 ENCOUNTER — Other Ambulatory Visit: Payer: Self-pay | Admitting: *Deleted

## 2016-06-06 NOTE — Patient Outreach (Signed)
[facility]) Care Management   06/06/2016  AMROM ORE 15-Sep-1950 [id number]  Victor Wise is an 66 y.o. male  Subjective:  Patient discussed he has been managing well at home, continues to drive and has been assisting his daughter with needs.   Patient discussed his problems with right shoulder and knee and may have surgery in the future.  Patient continues to monitor his blood sugar daily .   Patient denies chest pain or shortness of breath, he weighs daily .  Objective:  BP 132/78 (BP Location: Right Arm, Patient Position: Sitting, Cuff Size: Large)   Pulse 64   Resp 18   Wt 295 lb (133.8 kg) Comment: patient reported home weight  SpO2 97%   BMI 43.88 kg/m  Review of Systems  Constitutional: Negative.   HENT: Negative.   Eyes: Negative.   Respiratory: Negative.   Cardiovascular: Negative.   Gastrointestinal: Negative.   Genitourinary: Negative.   Musculoskeletal: Positive for back pain and joint pain.  Skin: Negative.   Neurological: Negative.   Endo/Heme/Allergies: Negative.   Psychiatric/Behavioral: Positive for depression.    Physical Exam  Constitutional: He is oriented to person, place, and time. He appears well-developed and well-nourished.  Cardiovascular: Normal rate, regular rhythm and normal heart sounds.   Respiratory: Effort normal and breath sounds normal.  GI: Soft.  Neurological: He is alert and oriented to person, place, and time.  Skin: Skin is warm and dry.  Psychiatric: He has a normal mood and affect. His behavior is normal. Judgment and thought content normal.    Encounter Medications:   Outpatient Encounter Prescriptions as of 06/06/2016  Medication Sig  . amLODipine (NORVASC) 5 MG tablet Take 1 tablet (5 mg total) by mouth daily. (Patient taking differently: Take 10 mg by mouth daily. )  . aspirin EC 81 MG tablet Take 81 mg by mouth daily.  . benazepril (LOTENSIN) 20 MG tablet Take 1 tablet (20 mg total) by mouth daily.  (Patient taking differently: Take 40 mg by mouth daily. )  . buPROPion (WELLBUTRIN XL) 300 MG 24 hr tablet Take 300 mg by mouth daily.   . busPIRone (BUSPAR) 10 MG tablet Take 10 mg by mouth at bedtime.  . clopidogrel (PLAVIX) 75 MG tablet Take 1 tablet (75 mg total) by mouth daily.  . DULoxetine (CYMBALTA) 30 MG capsule Take 30-60 mg by mouth 2 (two) times daily. Pt takes two capsules in the morning and one at night.  . Exenatide ER (BYDUREON) 2 MG PEN Inject 2 mg into the skin once a week.  . fenofibrate 160 MG tablet Take 1 tablet (160 mg total) by mouth every evening.  . finasteride (PROSCAR) 5 MG tablet Take 1 tablet (5 mg total) by mouth daily.  . fluticasone (FLONASE) 50 MCG/ACT nasal spray Place 2 sprays into both nostrils daily.  . hydrochlorothiazide (HYDRODIURIL) 12.5 MG tablet Take 12.5 mg by mouth daily.  . insulin aspart (NOVOLOG) 100 UNIT/ML injection Inject 5 Units into the skin 3 (three) times daily with meals. (Patient taking differently: Inject into the skin 3 (three) times daily with meals. Sliding scale Blood sugar less than 140 no insulin Blood sugar less than 140- 180 3 units Blood sugar less than 181-220 4 units, 220-260 6 units, 261-320 8 units, 321-360 10 units, 361-400, 12 units, more than 400 15 units and call doctor on call)  . NARCAN 4 MG/0.1ML LIQD Take 4 mg by mouth as needed (for opioid overdose).   [name]  nitroGLYCERIN (NITROSTAT) 0.4 MG SL tablet Place 1 tablet (0.4 mg total) under the tongue every 5 (five) minutes x 3 doses as needed for chest pain.  [name] omega-3 acid ethyl esters (LOVAZA) 1 g capsule Take 1 capsule (1 g total) by mouth 2 (two) times daily.  . Oxcarbazepine (TRILEPTAL) 300 MG tablet Take 300-600 mg by mouth 2 (two) times daily. Pt takes one tablet in the morning and two at night.  [name] oxyCODONE (OXY IR/ROXICODONE) 5 MG immediate release tablet Take 1 tablet (5 mg total) by mouth 3 (three) times daily as needed for severe pain.  [name] oxyCODONE (OXYCONTIN) 20  mg 12 hr tablet Take 1 tablet (20 mg total) by mouth every 12 (twelve) hours.  . pantoprazole (PROTONIX) 40 MG tablet Take 40 mg by mouth daily.  . phenazopyridine (PYRIDIUM) 200 MG tablet Take 200 mg by mouth 3 (three) times daily as needed for pain.  . polyethylene glycol (MIRALAX / GLYCOLAX) packet Take 17 g by mouth daily as needed.  . pregabalin (LYRICA) 200 MG capsule Take 200 mg by mouth 3 (three) times daily. Pt takes two capsules in the morning and one at night.  . senna (SENOKOT) 8.6 MG TABS tablet Take 1 tablet (8.6 mg total) by mouth 2 (two) times daily.  . tamsulosin (FLOMAX) 0.4 MG CAPS capsule Take 0.4 mg by mouth daily after supper.    No facility-administered encounter medications on file as of 06/06/2016.     Functional Status:   In your present state of health, do you have any difficulty performing the following activities: 06/06/2016 03/02/2016  Hearing? Y N  Vision? N N  Difficulty concentrating or making decisions? N N  Walking or climbing stairs? Y Y  Dressing or bathing? N N  Doing errands, shopping? N N  Preparing Food and eating ? N Y  Using the Toilet? N N  In the past six months, have you accidently leaked urine? Y Y  Do you have problems with loss of bowel control? N N  Managing your Medications? N N  Managing your Finances? N N  Housekeeping or managing your Housekeeping? Tempie Donning  Some recent data might be hidden    Fall/Depression Screening:    PHQ 2/9 Scores 06/06/2016 10/08/2015 09/08/2015 08/12/2015 07/27/2015 07/22/2015 04/03/2015  PHQ - 2 Score _0 - 4 6  PHQ- 9 Score _1 - 19 19  Exception Documentation - - - - Patient refusal - -    Assessment:    Right Shoulder- patient for follow up with orthopedic regarding surgery no appointment yet.   Falls - denies recent fall, , continues to use cane   Diabetes- 30 day average 155, consistently monitoring blood sugar, taking medications and admits to adhering to watching carbohydrate and trying to  prepare or obtain healthier meals.  Heart Failure-  Weighing weekly, no increase in swelling, sudden weight gains, green zone. Not adding salt to foods   Depression - positive screen, declines referrals for counseling, states he will continue to consider and will let doctor know when he decides, and he also has been given contact information for resources by MD and social workers.   Hypertension - agreeable to monitoring blood pressure at home and education on monitor provided.    Plan:  Will educate on use of blood pressure monitor Will schedule follow up telephone visit in the next month.    [facility] CM Care Plan Problem One   Flowsheet Row Most  Recent Value  Care Plan Problem One  High fall risk evidenced by recent fall   Role Documenting the Problem One  Care Management [place] for Problem One  Active  [facility] Long Term Goal (31-90 days)  Patient will report increase knowledge and adherence  of fall prevention measures in the next 60  days   [facility] Long Term Goal Start Date  04/25/16  [facility] Long Term Goal Met Date  06/06/16  [facility] CM Short Term Goal #1 (0-30 days)  Patient will reports using his cane at all times   [facility] CM Short Term Goal #1 Start Date  04/25/16  [facility] CM Short Term Goal #1 Met Date  06/06/16  [facility] CM Short Term Goal #2 (0-30 days)  Patient will report no falls in the next 30 days   [facility] CM Short Term Goal #2 Start Date  05/11/16 [goal date restarted ]  Interventions for Short Term Goal #2  Reinforced continued adherence of fall precautions   [facility] CM Short Term Goal #3 (0-30 days)  Patient will do exercise regimen from recent home therapy plan 3 days week in the 30 days   [facility] CM Short Term Goal #3 Start Date  05/11/16  [facility] CM Short Term Goal #3 Met Date  06/06/16  [facility] CM Short Term Goal #4 (0-30 days)  Patient will demonstrate proper use of blood pressure monitor and monitor reading at least weekly  in the next 30 days   Interventions for Short Term Goal #4  Educated and  allowed patient to return demonstate with minimal coaching needed, Discussed importance of keeping track of blood pressure reading and notifying MD of readings outside your normal range or symptoms      Joylene Draft, RN, [place] [facility] [phone]- Mobile [phone]- [place]

## 2016-06-11 DIAGNOSIS — C44319 Basal cell carcinoma of skin of other parts of face: Secondary | ICD-10-CM | POA: Diagnosis not present

## 2016-06-11 DIAGNOSIS — M6281 Muscle weakness (generalized): Secondary | ICD-10-CM | POA: Diagnosis not present

## 2016-06-11 DIAGNOSIS — G464 Cerebellar stroke syndrome: Secondary | ICD-10-CM | POA: Diagnosis not present

## 2016-06-11 DIAGNOSIS — R262 Difficulty in walking, not elsewhere classified: Secondary | ICD-10-CM | POA: Diagnosis not present

## 2016-06-25 DIAGNOSIS — G4733 Obstructive sleep apnea (adult) (pediatric): Secondary | ICD-10-CM | POA: Diagnosis not present

## 2016-06-29 ENCOUNTER — Other Ambulatory Visit: Payer: Self-pay | Admitting: *Deleted

## 2016-06-29 ENCOUNTER — Encounter: Payer: Self-pay | Admitting: *Deleted

## 2016-06-29 NOTE — Patient Outreach (Signed)
[place] [facility] [facility]) Care Management  06/29/2016  Victor Wise July 17, 1950 [id number]   Telephone assessment  Spoke with patient reports he is managing fairly well. Patient discussed he is able to increase activity at home, denies recent fall. Patient continues to drive daily tolerating well, helping his daughter with errands. Mr.Brizzi discussed he has decided to put off having shoulder f few months.   Patient reports being happy that he is now receiving meals on wheels starting on this week.  Patient continues monitor his blood sugar daily and reports reading around 140 to 160. , no hypoglycemic episodes, and reports watching to the foods that he eat to balance the starchy foods.   Patient monitors his blood pressure with readings around 130/60 denies dizzy episodes. Patient reports he continues to watch the salt in his diet.   Patient consistently attending PCP and specialist appointments, taking medications as prescribed per report, and monitoring blood sugar readings.   Patient denies any new concerns at this time, agrees nursing care goals have been met, patient declined need for follow up with telephonic health coach, states he understands what he is to do to manage health  and agreeable to case closure. Discussed with  patient to  contact [facility] office if future needs arise and made sure he had contact information .    Plan Will close case goals met,  Will send MD case closure letter and notify CMA of case closure .  Joylene Draft, RN, [place] Management [phone]- Mobile [phone]- Toll Free Main Office

## 2016-07-12 DIAGNOSIS — M6281 Muscle weakness (generalized): Secondary | ICD-10-CM | POA: Diagnosis not present

## 2016-07-12 DIAGNOSIS — C44319 Basal cell carcinoma of skin of other parts of face: Secondary | ICD-10-CM | POA: Diagnosis not present

## 2016-07-12 DIAGNOSIS — R262 Difficulty in walking, not elsewhere classified: Secondary | ICD-10-CM | POA: Diagnosis not present

## 2016-07-12 DIAGNOSIS — G464 Cerebellar stroke syndrome: Secondary | ICD-10-CM | POA: Diagnosis not present

## 2016-08-09 DIAGNOSIS — M6281 Muscle weakness (generalized): Secondary | ICD-10-CM | POA: Diagnosis not present

## 2016-08-09 DIAGNOSIS — G464 Cerebellar stroke syndrome: Secondary | ICD-10-CM | POA: Diagnosis not present

## 2016-08-09 DIAGNOSIS — C44319 Basal cell carcinoma of skin of other parts of face: Secondary | ICD-10-CM | POA: Diagnosis not present

## 2016-08-09 DIAGNOSIS — R262 Difficulty in walking, not elsewhere classified: Secondary | ICD-10-CM | POA: Diagnosis not present

## 2016-08-10 DIAGNOSIS — I2510 Atherosclerotic heart disease of native coronary artery without angina pectoris: Secondary | ICD-10-CM | POA: Diagnosis not present

## 2016-08-10 DIAGNOSIS — I10 Essential (primary) hypertension: Secondary | ICD-10-CM | POA: Diagnosis not present

## 2016-08-10 DIAGNOSIS — E66813 Obesity, class 3: Secondary | ICD-10-CM | POA: Insufficient documentation

## 2016-08-10 DIAGNOSIS — G629 Polyneuropathy, unspecified: Secondary | ICD-10-CM | POA: Diagnosis not present

## 2016-08-10 DIAGNOSIS — I5032 Chronic diastolic (congestive) heart failure: Secondary | ICD-10-CM | POA: Diagnosis not present

## 2016-08-10 DIAGNOSIS — E782 Mixed hyperlipidemia: Secondary | ICD-10-CM | POA: Diagnosis not present

## 2016-08-24 DIAGNOSIS — E1165 Type 2 diabetes mellitus with hyperglycemia: Secondary | ICD-10-CM | POA: Diagnosis not present

## 2016-08-24 DIAGNOSIS — J45909 Unspecified asthma, uncomplicated: Secondary | ICD-10-CM | POA: Diagnosis not present

## 2016-08-24 DIAGNOSIS — J309 Allergic rhinitis, unspecified: Secondary | ICD-10-CM | POA: Diagnosis not present

## 2016-08-24 DIAGNOSIS — N390 Urinary tract infection, site not specified: Secondary | ICD-10-CM | POA: Diagnosis not present

## 2016-08-24 DIAGNOSIS — I10 Essential (primary) hypertension: Secondary | ICD-10-CM | POA: Diagnosis not present

## 2016-09-08 DIAGNOSIS — I10 Essential (primary) hypertension: Secondary | ICD-10-CM | POA: Diagnosis not present

## 2016-09-08 DIAGNOSIS — I679 Cerebrovascular disease, unspecified: Secondary | ICD-10-CM | POA: Diagnosis not present

## 2016-09-08 DIAGNOSIS — E1165 Type 2 diabetes mellitus with hyperglycemia: Secondary | ICD-10-CM | POA: Diagnosis not present

## 2016-09-08 DIAGNOSIS — I2584 Coronary atherosclerosis due to calcified coronary lesion: Secondary | ICD-10-CM | POA: Diagnosis not present

## 2016-09-08 DIAGNOSIS — N390 Urinary tract infection, site not specified: Secondary | ICD-10-CM | POA: Diagnosis not present

## 2016-09-08 DIAGNOSIS — J45909 Unspecified asthma, uncomplicated: Secondary | ICD-10-CM | POA: Diagnosis not present

## 2016-09-09 DIAGNOSIS — R262 Difficulty in walking, not elsewhere classified: Secondary | ICD-10-CM | POA: Diagnosis not present

## 2016-09-09 DIAGNOSIS — M6281 Muscle weakness (generalized): Secondary | ICD-10-CM | POA: Diagnosis not present

## 2016-09-09 DIAGNOSIS — C44319 Basal cell carcinoma of skin of other parts of face: Secondary | ICD-10-CM | POA: Diagnosis not present

## 2016-09-09 DIAGNOSIS — G464 Cerebellar stroke syndrome: Secondary | ICD-10-CM | POA: Diagnosis not present

## 2016-09-27 DIAGNOSIS — I5032 Chronic diastolic (congestive) heart failure: Secondary | ICD-10-CM | POA: Diagnosis not present

## 2016-10-09 DIAGNOSIS — G464 Cerebellar stroke syndrome: Secondary | ICD-10-CM | POA: Diagnosis not present

## 2016-10-09 DIAGNOSIS — M6281 Muscle weakness (generalized): Secondary | ICD-10-CM | POA: Diagnosis not present

## 2016-10-09 DIAGNOSIS — R262 Difficulty in walking, not elsewhere classified: Secondary | ICD-10-CM | POA: Diagnosis not present

## 2016-10-09 DIAGNOSIS — C44319 Basal cell carcinoma of skin of other parts of face: Secondary | ICD-10-CM | POA: Diagnosis not present

## 2016-11-09 DIAGNOSIS — C44319 Basal cell carcinoma of skin of other parts of face: Secondary | ICD-10-CM | POA: Diagnosis not present

## 2016-11-09 DIAGNOSIS — R262 Difficulty in walking, not elsewhere classified: Secondary | ICD-10-CM | POA: Diagnosis not present

## 2016-11-09 DIAGNOSIS — M6281 Muscle weakness (generalized): Secondary | ICD-10-CM | POA: Diagnosis not present

## 2016-11-09 DIAGNOSIS — G464 Cerebellar stroke syndrome: Secondary | ICD-10-CM | POA: Diagnosis not present

## 2016-11-10 DIAGNOSIS — G2581 Restless legs syndrome: Secondary | ICD-10-CM | POA: Diagnosis not present

## 2016-11-10 DIAGNOSIS — K219 Gastro-esophageal reflux disease without esophagitis: Secondary | ICD-10-CM | POA: Diagnosis not present

## 2016-11-10 DIAGNOSIS — R0602 Shortness of breath: Secondary | ICD-10-CM | POA: Diagnosis not present

## 2016-11-10 DIAGNOSIS — G4733 Obstructive sleep apnea (adult) (pediatric): Secondary | ICD-10-CM | POA: Diagnosis not present

## 2016-12-09 DIAGNOSIS — R262 Difficulty in walking, not elsewhere classified: Secondary | ICD-10-CM | POA: Diagnosis not present

## 2016-12-09 DIAGNOSIS — C44319 Basal cell carcinoma of skin of other parts of face: Secondary | ICD-10-CM | POA: Diagnosis not present

## 2016-12-09 DIAGNOSIS — M6281 Muscle weakness (generalized): Secondary | ICD-10-CM | POA: Diagnosis not present

## 2016-12-09 DIAGNOSIS — G464 Cerebellar stroke syndrome: Secondary | ICD-10-CM | POA: Diagnosis not present

## 2016-12-23 DIAGNOSIS — I2584 Coronary atherosclerosis due to calcified coronary lesion: Secondary | ICD-10-CM | POA: Diagnosis not present

## 2016-12-23 DIAGNOSIS — J45909 Unspecified asthma, uncomplicated: Secondary | ICD-10-CM | POA: Diagnosis not present

## 2016-12-23 DIAGNOSIS — N390 Urinary tract infection, site not specified: Secondary | ICD-10-CM | POA: Diagnosis not present

## 2016-12-23 DIAGNOSIS — I10 Essential (primary) hypertension: Secondary | ICD-10-CM | POA: Diagnosis not present

## 2016-12-23 DIAGNOSIS — I679 Cerebrovascular disease, unspecified: Secondary | ICD-10-CM | POA: Diagnosis not present

## 2016-12-23 DIAGNOSIS — E1165 Type 2 diabetes mellitus with hyperglycemia: Secondary | ICD-10-CM | POA: Diagnosis not present

## 2017-02-08 DIAGNOSIS — E1149 Type 2 diabetes mellitus with other diabetic neurological complication: Secondary | ICD-10-CM | POA: Diagnosis not present

## 2017-02-08 DIAGNOSIS — I2510 Atherosclerotic heart disease of native coronary artery without angina pectoris: Secondary | ICD-10-CM | POA: Diagnosis not present

## 2017-02-08 DIAGNOSIS — I10 Essential (primary) hypertension: Secondary | ICD-10-CM | POA: Diagnosis not present

## 2017-02-08 DIAGNOSIS — I5032 Chronic diastolic (congestive) heart failure: Secondary | ICD-10-CM | POA: Diagnosis not present

## 2017-02-08 DIAGNOSIS — R001 Bradycardia, unspecified: Secondary | ICD-10-CM | POA: Diagnosis not present

## 2017-02-08 DIAGNOSIS — E782 Mixed hyperlipidemia: Secondary | ICD-10-CM | POA: Diagnosis not present

## 2017-04-27 ENCOUNTER — Other Ambulatory Visit: Payer: Self-pay

## 2017-04-27 MED ORDER — TAMSULOSIN HCL 0.4 MG PO CAPS: 0.4000 mg | ORAL_CAPSULE | Freq: Every day | ORAL | 3 refills | Status: DC

## 2017-05-02 ENCOUNTER — Other Ambulatory Visit: Payer: Self-pay | Admitting: Nurse Practitioner

## 2017-05-02 DIAGNOSIS — N390 Urinary tract infection, site not specified: Secondary | ICD-10-CM

## 2017-05-02 MED ORDER — CIPROFLOXACIN HCL 500 MG PO TABS: 500.0000 mg | ORAL_TABLET | Freq: Two times a day (BID) | ORAL | 0 refills | Status: DC

## 2017-05-02 NOTE — Progress Notes (Signed)
Sent in rx for ciprofloxacin 500mg  bid for 10 days for 10 days

## 2017-05-10 ENCOUNTER — Other Ambulatory Visit: Payer: Self-pay | Admitting: Nurse Practitioner

## 2017-05-10 DIAGNOSIS — N3000 Acute cystitis without hematuria: Secondary | ICD-10-CM

## 2017-05-10 MED ORDER — NITROFURANTOIN MONOHYD MACRO 100 MG PO CAPS: 100.0000 mg | ORAL_CAPSULE | Freq: Two times a day (BID) | ORAL | 0 refills | Status: DC

## 2017-05-10 NOTE — Progress Notes (Signed)
D/c cipro as it was ineffective.

## 2017-05-10 NOTE — Progress Notes (Signed)
Changed abx for uti to macrobid 100mg  twice daily for 10 days. Reported allergy to yellow dye has not been a problem in the past while taking macrobid.

## 2017-05-25 ENCOUNTER — Encounter: Payer: Self-pay | Admitting: Internal Medicine

## 2017-05-25 ENCOUNTER — Ambulatory Visit: Payer: Medicare HMO | Admitting: Internal Medicine

## 2017-05-25 VITALS — BP 140/82 | HR 74 | Resp 18 | Ht 68.0 in | Wt 282.0 lb

## 2017-05-25 DIAGNOSIS — J449 Chronic obstructive pulmonary disease, unspecified: Secondary | ICD-10-CM

## 2017-05-25 DIAGNOSIS — K219 Gastro-esophageal reflux disease without esophagitis: Secondary | ICD-10-CM | POA: Diagnosis not present

## 2017-05-25 DIAGNOSIS — R0602 Shortness of breath: Secondary | ICD-10-CM | POA: Diagnosis not present

## 2017-05-25 DIAGNOSIS — Z9989 Dependence on other enabling machines and devices: Secondary | ICD-10-CM

## 2017-05-25 DIAGNOSIS — G2581 Restless legs syndrome: Secondary | ICD-10-CM | POA: Diagnosis not present

## 2017-05-25 DIAGNOSIS — G4733 Obstructive sleep apnea (adult) (pediatric): Secondary | ICD-10-CM | POA: Diagnosis not present

## 2017-05-25 NOTE — Progress Notes (Signed)
[facility] [place], [place] [address]  Pulmonary Sleep Medicine  Office Visit Note  Patient Name: Victor Wise DOB: [birth year] MRN [id number]  Date of Service: 05/25/2017     Complaints/HPI:  He is doing well has not had any acute flare-ups are issues since his last visit.  Continues to use is positive airway pressure as prescribed.  Trying to exercise but is not been able to do well as far as losing weight.  Blood pressure under control.  Continues to follow with his primary care for this.  Current Medication: Outpatient Encounter Medications as of 05/25/2017  Medication Sig  . amLODipine (NORVASC) 5 MG tablet Take 1 tablet (5 mg total) by mouth daily. (Patient taking differently: Take 10 mg by mouth daily. )  . aspirin EC 81 MG tablet Take 81 mg by mouth daily.  . benazepril (LOTENSIN) 20 MG tablet Take 1 tablet (20 mg total) by mouth daily. (Patient taking differently: Take 40 mg by mouth daily. )  . buPROPion (WELLBUTRIN XL) 300 MG 24 hr tablet Take 300 mg by mouth daily.   . busPIRone (BUSPAR) 10 MG tablet Take 10 mg by mouth at bedtime.  . clopidogrel (PLAVIX) 75 MG tablet Take 1 tablet (75 mg total) by mouth daily.  . DULoxetine (CYMBALTA) 30 MG capsule Take 30-60 mg by mouth 2 (two) times daily. Pt takes two capsules in the morning and one at night.  . Exenatide ER (BYDUREON) 2 MG PEN Inject 2 mg into the skin once a week.  . fenofibrate 160 MG tablet Take 1 tablet (160 mg total) by mouth every evening.  . finasteride (PROSCAR) 5 MG tablet Take 1 tablet (5 mg total) by mouth daily.  . fluticasone (FLONASE) 50 MCG/ACT nasal spray Place 2 sprays into both nostrils daily.  . hydrochlorothiazide (HYDRODIURIL) 12.5 MG tablet Take 12.5 mg by mouth daily.  . insulin aspart (NOVOLOG) 100 UNIT/ML injection Inject 5 Units into the skin 3 (three) times daily with meals. (Patient taking differently: Inject into the skin 3 (three) times daily with meals. Sliding  scale Blood sugar less than 140 no insulin Blood sugar less than 140- 180 3 units Blood sugar less than 181-220 4 units, 220-260 6 units, 261-320 8 units, 321-360 10 units, 361-400, 12 units, more than 400 15 units and call doctor on call)  . NARCAN 4 MG/0.1ML LIQD Take 4 mg by mouth as needed (for opioid overdose).   . nitrofurantoin, macrocrystal-monohydrate, (MACROBID) 100 MG capsule Take 1 capsule (100 mg total) by mouth 2 (two) times daily.  . nitroGLYCERIN (NITROSTAT) 0.4 MG SL tablet Place 1 tablet (0.4 mg total) under the tongue every 5 (five) minutes x 3 doses as needed for chest pain.  [name] omega-3 acid ethyl esters (LOVAZA) 1 g capsule Take 1 capsule (1 g total) by mouth 2 (two) times daily.  . Oxcarbazepine (TRILEPTAL) 300 MG tablet Take 300-600 mg by mouth 2 (two) times daily. Pt takes one tablet in the morning and two at night.  [name] oxyCODONE (OXY IR/ROXICODONE) 5 MG immediate release tablet Take 1 tablet (5 mg total) by mouth 3 (three) times daily as needed for severe pain.  [name] oxyCODONE (OXYCONTIN) 20 mg 12 hr tablet Take 1 tablet (20 mg total) by mouth every 12 (twelve) hours.  . pantoprazole (PROTONIX) 40 MG tablet Take 40 mg by mouth daily.  . polyethylene glycol (MIRALAX / GLYCOLAX) packet Take 17 g by mouth daily as needed.  . pregabalin (  LYRICA) 200 MG capsule Take 200 mg by mouth 3 (three) times daily. Pt takes two capsules in the morning and one at night.  . senna (SENOKOT) 8.6 MG TABS tablet Take 1 tablet (8.6 mg total) by mouth 2 (two) times daily.  . tamsulosin (FLOMAX) 0.4 MG CAPS capsule Take 1 capsule (0.4 mg total) by mouth daily after supper.  . phenazopyridine (PYRIDIUM) 200 MG tablet Take 200 mg by mouth 3 (three) times daily as needed for pain.   No facility-administered encounter medications on file as of 05/25/2017.     Surgical History: Past Surgical History:  Procedure Laterality Date  . CARDIAC CATHETERIZATION N/A 12/19/2014   Procedure: Coronary Stent  Intervention;  Surgeon: Charolette Forward, MD;  Location: [facility];  Service: Cardiovascular;  Laterality: N/A;  . CARDIAC CATHETERIZATION Left 12/19/2014   Procedure: Left Heart Cath and Coronary Angiography;  Surgeon: Dionisio Kekoa, MD;  Location: [facility];  Service: Cardiovascular;  Laterality: Left;  . PACEMAKER INSERTION Left 11/02/2015   Procedure: INSERTION PACEMAKER;  Surgeon: Isaias Cowman, MD;  Location: [facility] ORS;  Service: Cardiovascular;  Laterality: Left;  . Pain Stimulator    . Right toe amputation      Medical History: Past Medical History:  Diagnosis Date  . Allergy   . Basal cell carcinoma    forehead  . BPH (benign prostatic hyperplasia)   . Chronic back pain   . Depression   . Diabetes ([place])   . GERD (gastroesophageal reflux disease)   . Heart attack ([place])   . Hypertension   . Morbid obesity ([place])   . Obstructive sleep apnea   . 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 Status post insertion of spinal cord stimulator   . Stroke ([place])   . UTI (lower urinary tract infection)     Family History: Family History  Problem Relation Age of Onset  . Breast cancer Mother   . Cancer Mother   . Hypertension Mother   . Lung cancer Father   . Hypertension Father   . Heart disease Father   . Cancer Father   . Kidney disease Sister   . Prostate cancer Neg Hx     Social History: Social History   Socioeconomic History  . Marital status: Divorced    Spouse name: Not on file  . Number of children: Not on file  . Years of education: Not on file  . Highest education level: Not on file  Social Needs  . Financial resource strain: Not on file  . Food insecurity - worry: Not on file  . Food insecurity - inability: Not on file  . Transportation needs - medical: Not on file  . Transportation needs - non-medical: Not on file  Occupational History  . Not on file  Tobacco Use  . Smoking status: Former Smoker    Types: Cigarettes  . Smokeless tobacco:  Never Used  . Tobacco comment: quit 45 years  Substance and Sexual Activity  . Alcohol use: Yes    Alcohol/week: 0.0 oz    Comment: occasionally  . Drug use: No  . Sexual activity: Not on file  Other Topics Concern  . Not on file  Social History Narrative  . Not on file     ROS  General: (-) fever, (-) chills, (-) night sweats, (-) weakness, (-) changes in appetite. Skin: (-) rashes, (-) itching,. Eyes: (-) visual changes, (-) redness, (-) itching, (-) double or blurred vision. Nose and  Sinuses: (-) nasal stuffiness or itchiness, (-) postnasal drip, (-) nosebleeds, (-) sinus trouble. Mouth and Throat: (-) sore throat, (-) hoarseness. Neck: (-) swollen glands, (-) enlarged thyroid, (-) neck pain. Respiratory: (-) cough, (-) bloody sputum, (-) shortness of breath, (-) wheezing. Cardiovascular: (-) ankle swelling, (-) chest pain. Lymphatic: (-) lymph node enlargement, (-) lymph node tenderness. Neurologic: (-) numbness, (-) tingling,(-) dizziness. Psychiatric: (-) anxiety, (-) depression.  Vital Signs: Blood pressure 140/82, pulse 74, resp. rate 18, height 5\' 8"  (1.727 m), weight 282 lb (127.9 kg), SpO2 97 %.  Examination: General Appearance: The patient is well-developed, well-nourished, and in no distress. Skin: Gross inspection of skin demonstrates no evidence of abnormality. Head: Patient's head is normocephalic, no gross deformities. Eyes: no gross deformities noted. ENT: ears appear grossly normal. Nasopharynx appears to be normal. Neck: Supple. No thyromegaly. No LAD. Respiratory: Lungs are clear to auscultation with no adventitious sounds. Cardiovascular: Normal S1 and S2 without murmur or rub. Extremities: No cyanosis. pulses are equal. Neurologic: Alert and oriented. No involuntary movements.  LABS: No results found for this or any previous visit (from the past 2160 hour(s)).  Radiology: Ct Head Wo Contrast  Result Date: 04/13/2016 CLINICAL DATA:   Posttraumatic dizziness after fall and head injury 3 days ago. EXAM: CT HEAD WITHOUT CONTRAST TECHNIQUE: Contiguous axial images were obtained from the base of the skull through the vertex without intravenous contrast. COMPARISON:  CT scan of February 12, 2016. FINDINGS: Brain: Mild diffuse cortical atrophy is noted. 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 No mass effect or midline shift is noted. Ventricular size is within normal limits. There is no evidence of mass lesion, hemorrhage or acute infarction. Vascular: Atherosclerosis of carotid siphons is noted. Skull: Normal. Negative for fracture or focal lesion. Sinuses/Orbits: No acute finding. Other: None. IMPRESSION: Mild diffuse cortical atrophy. Old left periventricular white matter infarction. No acute intracranial abnormality seen. Electronically Signed   By: Marijo Conception, M.D.   On: 04/13/2016 10:59    No results found.  No results found.    Assessment and Plan: Patient Active Problem List   Diagnosis Date Noted  . Acute renal failure (ARF) ([place]) 02/18/2016  . Hypotension 02/18/2016  . Sick sinus syndrome ([place]) 10/28/2015  . Syncopal episodes 10/26/2015  . Syncope 10/26/2015  . Adjustment disorder with mixed anxiety and depressed mood 10/20/2015  . Dysthymia 10/20/2015  . CVA (cerebral infarction) 10/19/2015  . Urinary retention 03/25/2015  . 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 Acute MI, anterolateral wall, subsequent episode of care ([place]) 12/19/2014  . SIRS (systemic inflammatory response syndrome) ([place]) 12/18/2014    1. OSA   Treated doing well we will continue with present management Patient is using an getting relief from this positive airway device  2. COPD  stable at this time no recent flare-ups or admissions from any acute bronchitis  3. RLS  seems to be controlled we will continue with present supportive management  4. Shortness of breath  he is at baseline we will continue with present  therapy Follow-up PFTs as indicated  5. GERD  under control will continue with present management  General Counseling: I have discussed the findings of the evaluation and examination with Shanon Brow.  I have also discussed any further diagnostic evaluation thatmay be needed or ordered today. Shanon Brow verbalizes understanding of the findings of todays visit. We also reviewed his medications today and discussed drug interactions and side effects including but not limited excessive drowsiness and altered  mental states. We also discussed that there is always a risk not just to him but also people around him. he has been encouraged to call the office with any questions or concerns that should arise related to todays visit.    Time spent: 34min  I have personally obtained a history, examined the patient, evaluated laboratory and imaging results, formulated the assessment and plan and placed orders.    Allyne Gee, MD [facility] Pulmonary and Critical Care Sleep medicine

## 2017-05-25 NOTE — Patient Instructions (Signed)

## 2017-05-29 ENCOUNTER — Other Ambulatory Visit: Payer: Self-pay | Admitting: Internal Medicine

## 2017-07-17 ENCOUNTER — Ambulatory Visit (INDEPENDENT_AMBULATORY_CARE_PROVIDER_SITE_OTHER): Payer: Medicare HMO | Admitting: Nurse Practitioner

## 2017-07-17 ENCOUNTER — Other Ambulatory Visit: Payer: Self-pay

## 2017-07-17 VITALS — BP 134/83 | HR 66 | Resp 16 | Ht 68.0 in | Wt 278.0 lb

## 2017-07-17 DIAGNOSIS — R300 Dysuria: Secondary | ICD-10-CM | POA: Diagnosis not present

## 2017-07-17 DIAGNOSIS — I739 Peripheral vascular disease, unspecified: Secondary | ICD-10-CM | POA: Diagnosis not present

## 2017-07-17 DIAGNOSIS — Z0001 Encounter for general adult medical examination with abnormal findings: Secondary | ICD-10-CM | POA: Diagnosis not present

## 2017-07-17 DIAGNOSIS — E1165 Type 2 diabetes mellitus with hyperglycemia: Secondary | ICD-10-CM | POA: Diagnosis not present

## 2017-07-17 DIAGNOSIS — E782 Mixed hyperlipidemia: Secondary | ICD-10-CM

## 2017-07-17 LAB — POCT CBG (FASTING - GLUCOSE)-MANUAL ENTRY: Glucose Fasting, POC: 428 mg/dL — AB (ref 70–99)

## 2017-07-17 MED ORDER — "INSULIN SYRINGE 29G X 1/2"" 1 ML MISC": 5 refills | Status: DC

## 2017-07-17 MED ORDER — INSULIN GLARGINE 100 UNIT/ML ~~LOC~~ SOLN: 55.0000 [IU] | Freq: Every day | SUBCUTANEOUS | 11 refills | Status: DC

## 2017-07-17 MED ORDER — EXENATIDE ER 2 MG ~~LOC~~ PEN: 2.0000 mg | PEN_INJECTOR | SUBCUTANEOUS | 5 refills | Status: DC

## 2017-07-17 MED ORDER — INSULIN ASPART 100 UNIT/ML ~~LOC~~ SOLN: 45.0000 [IU] | Freq: Three times a day (TID) | SUBCUTANEOUS | Status: DC

## 2017-07-17 MED ORDER — CLOPIDOGREL BISULFATE 75 MG PO TABS: 75.0000 mg | ORAL_TABLET | Freq: Every day | ORAL | 11 refills | Status: DC

## 2017-07-17 MED ORDER — OMEGA-3-ACID ETHYL ESTERS 1 G PO CAPS: 1.0000 g | ORAL_CAPSULE | Freq: Two times a day (BID) | ORAL | 5 refills | Status: DC

## 2017-07-17 MED ORDER — INSULIN REGULAR HUMAN 100 UNIT/ML IJ SOLN: INTRAMUSCULAR | 5 refills | Status: DC

## 2017-07-17 NOTE — Progress Notes (Signed)
[facility] [place], [place] [address]  Internal MEDICINE  Office Visit Note  Patient Name: Victor Wise  [id number]  [id number]  Date of Service: 07/17/2017  Chief Complaint  Patient presents with  . Diabetes    taking smaller than usual doses due to pharmacy closure. has been unable to fill his medications as needed.      Diabetes  He presents for his follow-up diabetic visit. He has type 2 diabetes mellitus. No MedicAlert identification noted. His disease course has been worsening. Hypoglycemia symptoms include headaches and nervousness/anxiousness. Pertinent negatives for hypoglycemia include no tremors. Associated symptoms include fatigue, foot ulcerations, polydipsia, polyuria, visual change and weakness. Pertinent negatives for diabetes include no chest pain. Symptoms are worsening. Risk factors for coronary artery disease include diabetes mellitus, dyslipidemia and hypertension. Current diabetic treatment includes insulin injections. He is compliant with treatment some of the time. He is following a low fat/cholesterol diet. When asked about meal planning, he reported none. He has not had a previous visit with a dietitian. He rarely participates in exercise. His home blood glucose trend is increasing rapidly. An ACE inhibitor/angiotensin II receptor blocker is being taken. He sees a podiatrist.  Pt is here for routine health maintenance examination  Current Medication: Outpatient Encounter Medications as of 07/17/2017  Medication Sig  . aspirin EC 81 MG tablet Take 81 mg by mouth daily.  . benazepril (LOTENSIN) 20 MG tablet Take 1 tablet (20 mg total) by mouth daily. (Patient taking differently: Take 40 mg by mouth daily. )  . clopidogrel (PLAVIX) 75 MG tablet Take 1 tablet (75 mg total) by mouth daily.  . DULoxetine (CYMBALTA) 30 MG capsule Take 30-60 mg by mouth 2 (two) times daily. Pt takes two capsules in the morning and one at night.  . fenofibrate  160 MG tablet Take 1 tablet (160 mg total) by mouth every evening.  . finasteride (PROSCAR) 5 MG tablet Take 1 tablet (5 mg total) by mouth daily.  . fluticasone (FLONASE) 50 MCG/ACT nasal spray Place 2 sprays into both nostrils daily.  . hydrochlorothiazide (HYDRODIURIL) 12.5 MG tablet Take 12.5 mg by mouth daily.  . insulin aspart (NOVOLOG) 100 UNIT/ML injection Inject 45 Units into the skin 3 (three) times daily with meals.  [name] NARCAN 4 MG/0.1ML LIQD Take 4 mg by mouth as needed (for opioid overdose).   . nitroGLYCERIN (NITROSTAT) 0.4 MG SL tablet Place 1 tablet (0.4 mg total) under the tongue every 5 (five) minutes x 3 doses as needed for chest pain.  . Oxcarbazepine (TRILEPTAL) 300 MG tablet Take 300-600 mg by mouth 2 (two) times daily. Pt takes one tablet in the morning and two at night.  [name] oxyCODONE (OXY IR/ROXICODONE) 5 MG immediate release tablet Take 1 tablet (5 mg total) by mouth 3 (three) times daily as needed for severe pain.  [name] oxyCODONE (OXYCONTIN) 20 mg 12 hr tablet Take 1 tablet (20 mg total) by mouth every 12 (twelve) hours.  . pantoprazole (PROTONIX) 40 MG tablet Take 40 mg by mouth daily.  . polyethylene glycol (MIRALAX / GLYCOLAX) packet Take 17 g by mouth daily as needed.  . pregabalin (LYRICA) 200 MG capsule Take 200 mg by mouth 3 (three) times daily. Pt takes two capsules in the morning and one at night.  . senna (SENOKOT) 8.6 MG TABS tablet Take 1 tablet (8.6 mg total) by mouth 2 (two) times daily.  . tamsulosin (FLOMAX) 0.4 MG CAPS capsule Take 1 capsule (0.4  mg total) by mouth daily after supper.  . [DISCONTINUED] clopidogrel (PLAVIX) 75 MG tablet TAKE ONE TABLET BY MOUTH EVERY DAY  . [DISCONTINUED] Exenatide ER (BYDUREON) 2 MG PEN Inject 2 mg into the skin once a week.  . [DISCONTINUED] insulin aspart (NOVOLOG) 100 UNIT/ML injection Inject 5 Units into the skin 3 (three) times daily with meals. (Patient taking differently: Inject into the skin 3 (three) times daily with  meals. Sliding scale Blood sugar less than 140 no insulin Blood sugar less than 140- 180 3 units Blood sugar less than 181-220 4 units, 220-260 6 units, 261-320 8 units, 321-360 10 units, 361-400, 12 units, more than 400 15 units and call doctor on call)  . [DISCONTINUED] phenazopyridine (PYRIDIUM) 200 MG tablet Take 200 mg by mouth 3 (three) times daily as needed for pain.  [name] amLODipine (NORVASC) 5 MG tablet Take 1 tablet (5 mg total) by mouth daily. (Patient not taking: Reported on 07/17/2017)  . buPROPion (WELLBUTRIN XL) 300 MG 24 hr tablet Take 300 mg by mouth daily.   . busPIRone (BUSPAR) 10 MG tablet Take 10 mg by mouth at bedtime.  . Exenatide ER (BYDUREON) 2 MG PEN Inject 2 mg into the skin once a week.  . insulin glargine (LANTUS) 100 UNIT/ML injection Inject 0.55 mLs (55 Units total) into the skin at bedtime.  [name] omega-3 acid ethyl esters (LOVAZA) 1 g capsule Take 1 capsule (1 g total) by mouth 2 (two) times daily. (Patient not taking: Reported on 07/17/2017)  . [DISCONTINUED] nitrofurantoin, macrocrystal-monohydrate, (MACROBID) 100 MG capsule Take 1 capsule (100 mg total) by mouth 2 (two) times daily. (Patient not taking: Reported on 07/17/2017)   No facility-administered encounter medications on file as of 07/17/2017.     Surgical History: Past Surgical History:  Procedure Laterality Date  . CARDIAC CATHETERIZATION N/A 12/19/2014   Procedure: Coronary Stent Intervention;  Surgeon: Charolette Forward, MD;  Location: [facility];  Service: Cardiovascular;  Laterality: N/A;  . CARDIAC CATHETERIZATION Left 12/19/2014   Procedure: Left Heart Cath and Coronary Angiography;  Surgeon: Dionisio Tajah, MD;  Location: [facility];  Service: Cardiovascular;  Laterality: Left;  . PACEMAKER INSERTION Left 11/02/2015   Procedure: INSERTION PACEMAKER;  Surgeon: Isaias Cowman, MD;  Location: [facility];  Service: Cardiovascular;  Laterality: Left;  . Pain Stimulator    . Right toe amputation       Medical History: Past Medical History:  Diagnosis Date  . Allergy   . Basal cell carcinoma    forehead  . BPH (benign prostatic hyperplasia)   . Chronic back pain   . Depression   . Diabetes ([place])   . GERD (gastroesophageal reflux disease)   . Heart attack ([place])   . Hypertension   . Morbid obesity ([place])   . Obstructive sleep apnea   . 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 Status post insertion of spinal cord stimulator   . Stroke ([place])   . UTI (lower urinary tract infection)     Family History: Family History  Problem Relation Age of Onset  . Breast cancer Mother   . Cancer Mother   . Hypertension Mother   . Lung cancer Father   . Hypertension Father   . Heart disease Father   . Cancer Father   . Kidney disease Sister   . Prostate cancer Neg Hx       Review of Systems  Constitutional: Positive for activity change and fatigue. Negative for chills  and unexpected weight change.       Generalized weakness  HENT: Negative for congestion, postnasal drip, rhinorrhea, sneezing and sore throat.   Eyes: Negative.  Negative for redness.  Respiratory: Positive for shortness of breath. Negative for cough, chest tightness and wheezing.   Cardiovascular: Negative for chest pain and palpitations.  Gastrointestinal: Negative for abdominal pain, blood in stool, constipation, diarrhea, nausea and vomiting.  Endocrine: Positive for polydipsia and polyuria.  Genitourinary: Positive for frequency and urgency. Negative for dysuria.  Musculoskeletal: Positive for arthralgias and back pain. Negative for joint swelling and neck pain.       Sees pain management regularly   Skin: Negative for rash.  Allergic/Immunologic: Positive for environmental allergies.  Neurological: Positive for weakness and headaches. Negative for tremors and numbness.  Hematological: Negative for adenopathy. Does not bruise/bleed easily.  Psychiatric/Behavioral: Positive for behavioral problems (Depression).  Negative for self-injury, sleep disturbance and suicidal ideas. The patient is nervous/anxious.      Today's Vitals   07/17/17 1537  BP: 134/83  Pulse: 66  Resp: 16  SpO2: 97%  Weight: 278 lb (126.1 kg)  Height: 5\' 8"  (1.727 m)     Physical Exam  Constitutional: He is oriented to person, place, and time. He appears well-developed and well-nourished. No distress.  HENT:  Head: Normocephalic and atraumatic.  Mouth/Throat: Oropharynx is clear and moist. No oropharyngeal exudate.  Eyes: EOM are normal. Pupils are equal, round, and reactive to light.  Neck: Normal range of motion. Neck supple. No JVD present. Carotid bruit is not present. No tracheal deviation present. No thyromegaly present.  Cardiovascular: Normal rate, regular rhythm, normal heart sounds and intact distal pulses. Exam reveals no gallop and no friction rub.  No murmur heard. Pulmonary/Chest: Effort normal and breath sounds normal. No respiratory distress. He has no wheezes. He has no rales. He exhibits no tenderness.  Abdominal: Soft. Bowel sounds are normal. There is no tenderness.  Musculoskeletal:  Joint and muscle pain in back, hips, knees, and feet. No point tenderness noted. Uses a cane to help with mobility and balance.   Lymphadenopathy:    He has no cervical adenopathy.  Neurological: He is alert and oriented to person, place, and time. No cranial nerve deficit.  Skin: Skin is warm and dry. He is not diaphoretic.  Psychiatric: His speech is normal. Judgment and thought content normal. He is slowed. Cognition and memory are normal. He exhibits a depressed mood.  Nursing note and vitals reviewed.    LABS: Recent Results (from the past 2160 hour(s))  POCT CBG (Fasting - Glucose)     Status: Abnormal   Collection Time: 07/17/17  4:15 PM  Result Value Ref Range   Glucose Fasting, POC 428 (A) 70 - 99 mg/dL    Assessment/Plan:  1. Encounter for general adult medical examination with abnormal  findings Annual wellness visit today  2. Uncontrolled type 2 diabetes mellitus with hyperglycemia (HCC) Unable to check HgbA1c due to it being too high. Getting back on track with medications and will check again in 6 weeks. - POCT CBG (Fasting - Glucose) - insulin aspart (NOVOLOG) 100 UNIT/ML injection; Inject 45 Units into the skin 3 (three) times daily with meals. - insulin glargine (LANTUS) 100 UNIT/ML injection; Inject 0.55 mLs (55 Units total) into the skin at bedtime.  Dispense: 10 mL; Refill: 11 - Exenatide ER (BYDUREON) 2 MG PEN; Inject 2 mg into the skin once a week.  Dispense: 4 each; Refill: 5 - INSULIN  SYRINGE 1CC/29G 29G X 1/2" 1 ML MISC; Insulin injections QID, use with lantus and regular insulin. DX E11.65  Dispense: 120 each; Refill: 5  3. Peripheral vascular disease (HCC) - clopidogrel (PLAVIX) 75 MG tablet; Take 1 tablet (75 mg total) by mouth daily.  Dispense: 30 tablet; Refill: 11  4. Mixed hyperlipidemia - omega-3 acid ethyl esters (LOVAZA) 1 g capsule; Take 1 capsule (1 g total) by mouth 2 (two) times daily.  Dispense: 60 capsule; Refill: 5   General Counseling: Merrilee Jansky understanding of the findings of todays visit and agrees with plan of treatment. I have discussed any further diagnostic evaluation that may be needed or ordered today. We also reviewed his medications today. he has been encouraged to call the office with any questions or concerns that should arise related to todays visit.  Diabetes Counseling:  1. Addition of ACE inh/ ARB'S for nephroprotection. 2. Diabetic foot care, prevention of complications.  3.Exercise and lose weight.  4. Diabetic eye examination, 5. Monitor blood sugar closlely. nutrition counseling.  6.Sign and symptoms of hypoglycemia including shaking sweating,confusion and headaches.  This patient was seen by Leretha Pol, FNP- C in Collaboration with Dr Lavera Guise as a part of collaborative care agreement    Orders  Placed This Encounter  Procedures  . POCT CBG (Fasting - Glucose)    Meds ordered this encounter  Medications  . insulin aspart (NOVOLOG) 100 UNIT/ML injection    Sig: Inject 45 Units into the skin 3 (three) times daily with meals.    Order Specific Question:   Supervising Provider    Answer:   Lavera Guise [[id number]]  . insulin glargine (LANTUS) 100 UNIT/ML injection    Sig: Inject 0.55 mLs (55 Units total) into the skin at bedtime.    Dispense:  10 mL    Refill:  11    Order Specific Question:   Supervising Provider    Answer:   Lavera Guise [[id number]]  . Exenatide ER (BYDUREON) 2 MG PEN    Sig: Inject 2 mg into the skin once a week.    Dispense:  4 each    Refill:  5    Order Specific Question:   Supervising Provider    Answer:   Lavera Guise [[id number]]  . clopidogrel (PLAVIX) 75 MG tablet    Sig: Take 1 tablet (75 mg total) by mouth daily.    Dispense:  30 tablet    Refill:  11    Order Specific Question:   Supervising Provider    Answer:   Lavera Guise [[id number]]    Time spent: Gratz, MD  Internal Medicine

## 2017-07-18 ENCOUNTER — Other Ambulatory Visit: Payer: Self-pay

## 2017-07-18 DIAGNOSIS — E1165 Type 2 diabetes mellitus with hyperglycemia: Secondary | ICD-10-CM

## 2017-07-18 LAB — URINALYSIS, ROUTINE W REFLEX MICROSCOPIC
Bilirubin, UA: NEGATIVE
Ketones, UA: NEGATIVE
LEUKOCYTES UA: NEGATIVE
Nitrite, UA: NEGATIVE
PH UA: 5 (ref 5.0–7.5)
PROTEIN UA: NEGATIVE
RBC, UA: NEGATIVE
Urobilinogen, Ur: 0.2 mg/dL (ref 0.2–1.0)

## 2017-07-19 MED ORDER — INSULIN REGULAR HUMAN 100 UNIT/ML IJ SOLN: INTRAMUSCULAR | 11 refills | Status: DC

## 2017-08-02 ENCOUNTER — Other Ambulatory Visit: Payer: Self-pay | Admitting: Internal Medicine

## 2017-08-02 DIAGNOSIS — E1165 Type 2 diabetes mellitus with hyperglycemia: Secondary | ICD-10-CM | POA: Insufficient documentation

## 2017-08-02 DIAGNOSIS — R300 Dysuria: Secondary | ICD-10-CM | POA: Insufficient documentation

## 2017-08-02 DIAGNOSIS — I739 Peripheral vascular disease, unspecified: Secondary | ICD-10-CM | POA: Insufficient documentation

## 2017-08-02 DIAGNOSIS — E782 Mixed hyperlipidemia: Secondary | ICD-10-CM | POA: Insufficient documentation

## 2017-08-03 ENCOUNTER — Other Ambulatory Visit: Payer: Self-pay

## 2017-08-03 MED ORDER — HYDROCHLOROTHIAZIDE 12.5 MG PO TABS: 12.5000 mg | ORAL_TABLET | Freq: Every day | ORAL | 5 refills | Status: DC

## 2017-08-03 MED ORDER — TAMSULOSIN HCL 0.4 MG PO CAPS: 0.4000 mg | ORAL_CAPSULE | Freq: Every day | ORAL | 3 refills | Status: DC

## 2017-08-09 DIAGNOSIS — E782 Mixed hyperlipidemia: Secondary | ICD-10-CM | POA: Diagnosis not present

## 2017-08-09 DIAGNOSIS — E1149 Type 2 diabetes mellitus with other diabetic neurological complication: Secondary | ICD-10-CM | POA: Diagnosis not present

## 2017-08-09 DIAGNOSIS — I110 Hypertensive heart disease with heart failure: Secondary | ICD-10-CM | POA: Diagnosis not present

## 2017-08-09 DIAGNOSIS — I2109 ST elevation (STEMI) myocardial infarction involving other coronary artery of anterior wall: Secondary | ICD-10-CM | POA: Diagnosis not present

## 2017-08-09 DIAGNOSIS — I5032 Chronic diastolic (congestive) heart failure: Secondary | ICD-10-CM | POA: Diagnosis not present

## 2017-08-09 DIAGNOSIS — I2510 Atherosclerotic heart disease of native coronary artery without angina pectoris: Secondary | ICD-10-CM | POA: Diagnosis not present

## 2017-08-09 DIAGNOSIS — R001 Bradycardia, unspecified: Secondary | ICD-10-CM | POA: Diagnosis not present

## 2017-08-09 DIAGNOSIS — I10 Essential (primary) hypertension: Secondary | ICD-10-CM | POA: Diagnosis not present

## 2017-08-28 ENCOUNTER — Ambulatory Visit: Payer: Self-pay | Admitting: Nurse Practitioner

## 2017-09-04 ENCOUNTER — Other Ambulatory Visit: Payer: Self-pay

## 2017-09-04 MED ORDER — HYDROCHLOROTHIAZIDE 12.5 MG PO TABS: 12.5000 mg | ORAL_TABLET | Freq: Every day | ORAL | 5 refills | Status: DC

## 2017-09-05 ENCOUNTER — Ambulatory Visit (INDEPENDENT_AMBULATORY_CARE_PROVIDER_SITE_OTHER): Payer: Medicare HMO | Admitting: Nurse Practitioner

## 2017-09-05 ENCOUNTER — Encounter: Payer: Self-pay | Admitting: Nurse Practitioner

## 2017-09-05 VITALS — BP 132/80 | HR 70 | Resp 16 | Ht 68.0 in | Wt 296.0 lb

## 2017-09-05 DIAGNOSIS — I739 Peripheral vascular disease, unspecified: Secondary | ICD-10-CM | POA: Diagnosis not present

## 2017-09-05 DIAGNOSIS — G4733 Obstructive sleep apnea (adult) (pediatric): Secondary | ICD-10-CM

## 2017-09-05 DIAGNOSIS — I10 Essential (primary) hypertension: Secondary | ICD-10-CM

## 2017-09-05 DIAGNOSIS — E1165 Type 2 diabetes mellitus with hyperglycemia: Secondary | ICD-10-CM | POA: Diagnosis not present

## 2017-09-05 DIAGNOSIS — Z9989 Dependence on other enabling machines and devices: Secondary | ICD-10-CM

## 2017-09-05 LAB — POCT GLYCOSYLATED HEMOGLOBIN (HGB A1C): Hemoglobin A1C: 11.6

## 2017-09-05 MED ORDER — INSULIN GLARGINE 100 UNIT/ML ~~LOC~~ SOLN: 60.0000 [IU] | Freq: Every day | SUBCUTANEOUS | 11 refills | Status: DC

## 2017-09-05 NOTE — Progress Notes (Signed)
[facility] [place], [place] [address]  Internal MEDICINE  Office Visit Note  Patient Name: Victor Wise  [id number]  [id number]  Date of Service: 09/05/2017  Chief Complaint  Patient presents with  . Diabetes  . Hypertension    Patient here for follow up visit. Diabetic medications adjusted at last visit, as HgbA1c had been so elevated, would not read on office machine. Currently taking Lantus 55units daily and using his regular insulin at 20units twice daily. He has missed last few doses of Bydurean due to cost. He was given paperwork for patient assistance for this and his lantus. He has returned paperwork for lantus but cannot find that for his bydurean. He is reporting some positive lifestyle changes as well. Eating healthier. Trying to walk every night when weather is good   Diabetes  He presents for his follow-up diabetic visit. He has type 2 diabetes mellitus. No MedicAlert identification noted. His disease course has been worsening. Hypoglycemia symptoms include headaches and nervousness/anxiousness. Pertinent negatives for hypoglycemia include no tremors. Associated symptoms include fatigue, foot ulcerations, polydipsia, polyuria, visual change and weakness. Pertinent negatives for diabetes include no chest pain. There are no hypoglycemic complications. Symptoms are improving. Diabetic complications include a CVA, nephropathy and PVD. Risk factors for coronary artery disease include diabetes mellitus, dyslipidemia and hypertension. Current diabetic treatment includes insulin injections. He is compliant with treatment most of the time. He is following a low fat/cholesterol diet. When asked about meal planning, he reported none. He has not had a previous visit with a dietitian. He rarely participates in exercise. His home blood glucose trend is increasing rapidly. An ACE inhibitor/angiotensin II receptor blocker is being taken. He sees a podiatrist.Eye exam is not  current.    Pt is here for routine follow up.    Current Medication: Outpatient Encounter Medications as of 09/05/2017  Medication Sig  . amLODipine (NORVASC) 5 MG tablet Take 1 tablet (5 mg total) by mouth daily. (Patient not taking: Reported on 07/17/2017)  . aspirin EC 81 MG tablet Take 81 mg by mouth daily.  . benazepril (LOTENSIN) 20 MG tablet TAKE 1 TABLET BY MOUTH TWICE DAILY  . buPROPion (WELLBUTRIN XL) 300 MG 24 hr tablet Take 300 mg by mouth daily.   . busPIRone (BUSPAR) 10 MG tablet Take 10 mg by mouth at bedtime.  . clopidogrel (PLAVIX) 75 MG tablet Take 1 tablet (75 mg total) by mouth daily.  . DULoxetine (CYMBALTA) 30 MG capsule Take 30-60 mg by mouth 2 (two) times daily. Pt takes two capsules in the morning and one at night.  . Exenatide ER (BYDUREON) 2 MG PEN Inject 2 mg into the skin once a week.  . fenofibrate 160 MG tablet Take 1 tablet (160 mg total) by mouth every evening.  . fenofibrate 54 MG tablet TAKE 1 TABLET BY MOUTH A DAY AT SUPPERTIME  . finasteride (PROSCAR) 5 MG tablet Take 1 tablet (5 mg total) by mouth daily.  . fluticasone (FLONASE) 50 MCG/ACT nasal spray Place 2 sprays into both nostrils daily.  . hydrochlorothiazide (HYDRODIURIL) 12.5 MG tablet Take 1 tablet (12.5 mg total) by mouth daily.  . insulin glargine (LANTUS) 100 UNIT/ML injection Inject 0.6 mLs (60 Units total) into the skin daily.  . insulin regular (NOVOLIN R) 100 units/mL injection Patient to use Novolin R [place] TiD prior to meals per sliding scale instructions. Max daily dose is 45 units per day.  . INSULIN SYRINGE 1CC/29G 29G X 1/2"  1 ML MISC Insulin injections QID, use with lantus and regular insulin. DX E11.65  . NARCAN 4 MG/0.1ML LIQD Take 4 mg by mouth as needed (for opioid overdose).   . nitroGLYCERIN (NITROSTAT) 0.4 MG SL tablet Place 1 tablet (0.4 mg total) under the tongue every 5 (five) minutes x 3 doses as needed for chest pain.  [name] omega-3 acid ethyl esters (LOVAZA) 1 g capsule Take 1  capsule (1 g total) by mouth 2 (two) times daily.  . Oxcarbazepine (TRILEPTAL) 300 MG tablet Take 300-600 mg by mouth 2 (two) times daily. Pt takes one tablet in the morning and two at night.  [name] oxyCODONE (OXY IR/ROXICODONE) 5 MG immediate release tablet Take 1 tablet (5 mg total) by mouth 3 (three) times daily as needed for severe pain.  [name] oxyCODONE (OXYCONTIN) 20 mg 12 hr tablet Take 1 tablet (20 mg total) by mouth every 12 (twelve) hours.  . pantoprazole (PROTONIX) 40 MG tablet TAKE 1 TABLET BY MOUTH A DAY FOR REFLUX  . polyethylene glycol (MIRALAX / GLYCOLAX) packet Take 17 g by mouth daily as needed.  . pregabalin (LYRICA) 200 MG capsule Take 200 mg by mouth 3 (three) times daily. Pt takes two capsules in the morning and one at night.  . senna (SENOKOT) 8.6 MG TABS tablet Take 1 tablet (8.6 mg total) by mouth 2 (two) times daily.  . tamsulosin (FLOMAX) 0.4 MG CAPS capsule Take 1 capsule (0.4 mg total) by mouth daily after supper.  . [DISCONTINUED] insulin glargine (LANTUS) 100 UNIT/ML injection Inject 0.55 mLs (55 Units total) into the skin at bedtime.   No facility-administered encounter medications on file as of 09/05/2017.     Surgical History: Past Surgical History:  Procedure Laterality Date  . CARDIAC CATHETERIZATION N/A 12/19/2014   Procedure: Coronary Stent Intervention;  Surgeon: Charolette Forward, MD;  Location: [place] [facility];  Service: Cardiovascular;  Laterality: N/A;  . CARDIAC CATHETERIZATION Left 12/19/2014   Procedure: Left Heart Cath and Coronary Angiography;  Surgeon: Dionisio Rilan, MD;  Location: [place] [facility];  Service: Cardiovascular;  Laterality: Left;  . PACEMAKER INSERTION Left 11/02/2015   Procedure: INSERTION PACEMAKER;  Surgeon: Isaias Cowman, MD;  Location: [facility];  Service: Cardiovascular;  Laterality: Left;  . Pain Stimulator    . Right toe amputation      Medical History: Past Medical History:  Diagnosis Date  . Allergy   . Basal cell  carcinoma    forehead  . BPH (benign prostatic hyperplasia)   . Chronic back pain   . Depression   . Diabetes ([place])   . GERD (gastroesophageal reflux disease)   . Heart attack ([place])   . Hypertension   . Morbid obesity ([place])   . Obstructive sleep apnea   . 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 Status post insertion of spinal cord stimulator   . Stroke ([place])   . UTI (lower urinary tract infection)     Family History: Family History  Problem Relation Age of Onset  . Breast cancer Mother   . Cancer Mother   . Hypertension Mother   . Lung cancer Father   . Hypertension Father   . Heart disease Father   . Cancer Father   . Kidney disease Sister   . Prostate cancer Neg Hx     Social History   Socioeconomic History  . Marital status: Divorced    Spouse name: Not on file  . Number of children: Not on  file  . Years of education: Not on file  . Highest education level: Not on file  Occupational History  . Not on file  Social Needs  . Financial resource strain: Not on file  . Food insecurity:    Worry: Not on file    Inability: Not on file  . Transportation needs:    Medical: Not on file    Non-medical: Not on file  Tobacco Use  . Smoking status: Former Smoker    Types: Cigarettes  . Smokeless tobacco: Never Used  . Tobacco comment: quit 45 years  Substance and Sexual Activity  . Alcohol use: Yes    Alcohol/week: 0.0 oz    Comment: occasionally  . Drug use: No  . Sexual activity: Not on file  Lifestyle  . Physical activity:    Days per week: Not on file    Minutes per session: Not on file  . Stress: Not on file  Relationships  . Social connections:    Talks on phone: Not on file    Gets together: Not on file    Attends religious service: Not on file    Active member of club or organization: Not on file    Attends meetings of clubs or organizations: Not on file    Relationship status: Not on file  . Intimate partner violence:    Fear of current or ex partner:  Not on file    Emotionally abused: Not on file    Physically abused: Not on file    Forced sexual activity: Not on file  Other Topics Concern  . Not on file  Social History Narrative  . Not on file      Review of Systems  Constitutional: Positive for activity change and fatigue. Negative for chills and unexpected weight change.       Generalized weakness  HENT: Negative for congestion, postnasal drip, rhinorrhea, sneezing and sore throat.   Eyes: Negative.  Negative for redness.  Respiratory: Positive for shortness of breath. Negative for cough, chest tightness and wheezing.   Cardiovascular: Negative for chest pain and palpitations.  Gastrointestinal: Negative for abdominal pain, blood in stool, constipation, diarrhea, nausea and vomiting.  Endocrine: Positive for polydipsia and polyuria.  Genitourinary: Positive for frequency and urgency. Negative for dysuria.  Musculoskeletal: Positive for arthralgias and back pain. Negative for joint swelling and neck pain.       Sees pain management regularly   Skin: Negative for rash.  Allergic/Immunologic: Positive for environmental allergies.  Neurological: Positive for weakness and headaches. Negative for tremors and numbness.  Hematological: Negative for adenopathy. Does not bruise/bleed easily.  Psychiatric/Behavioral: Positive for behavioral problems (Depression). Negative for self-injury, sleep disturbance and suicidal ideas. The patient is nervous/anxious.     Vital Signs: BP 132/80   Pulse 70   Resp 16   Ht 5\' 8"  (1.727 m)   Wt 296 lb (134.3 kg)   SpO2 98%   BMI 45.01 kg/m    Physical Exam  Constitutional: He is oriented to person, place, and time. He appears well-developed and well-nourished. No distress.  HENT:  Head: Normocephalic and atraumatic.  Mouth/Throat: Oropharynx is clear and moist. No oropharyngeal exudate.  Eyes: Pupils are equal, round, and reactive to light. Conjunctivae and EOM are normal.  Neck: Normal  range of motion. Neck supple. No JVD present. No tracheal deviation present. No thyromegaly present.  Cardiovascular: Normal rate, regular rhythm and intact distal pulses. Exam reveals no gallop and no friction rub.  Murmur heard. Pulmonary/Chest: Effort normal and breath sounds normal. No respiratory distress. He has no wheezes. He has no rales. He exhibits no tenderness.  Abdominal: Soft. Bowel sounds are normal. There is no tenderness.  Musculoskeletal:  Joint and muscle pain in back, hips, knees, and feet. No point tenderness noted. Uses a cane to help with mobility and balance.   Lymphadenopathy:    He has no cervical adenopathy.  Neurological: He is alert and oriented to person, place, and time. No cranial nerve deficit.  Skin: Skin is warm and dry. He is not diaphoretic.  Psychiatric: His speech is normal. Judgment and thought content normal. He is slowed. Cognition and memory are normal. He exhibits a depressed mood.  Overall mood improvement since last visit.   Nursing note and vitals reviewed.  Assessment/Plan: 1. Uncontrolled type 2 diabetes mellitus with hyperglycemia (HCC) - POCT HgB A1C - 11.6 today. Though still very high, much improved from last visit. Increase lantus to 60 units daily. Continue regular insulin twice daily. Will get new patient assistance paperwork for bydurean. Patient should continue this once weekly.  - insulin glargine (LANTUS) 100 UNIT/ML injection; Inject 0.6 mLs (60 Units total) into the skin daily.  Dispense: 10 mL; Refill: 11  2. Essential hypertension Stable. Continue tohold amlodipine. Continue all other bp medication as prescribed .  3. Peripheral vascular disease (HCC) Continue plavix as prescribed.   4. OSA on CPAP Continue CPAP therapy and regular visits with Dr. Devona Konig for CPAP management.   General Counseling: markey deady understanding of the findings of todays visit and agrees with plan of treatment. I have discussed any  further diagnostic evaluation that may be needed or ordered today. We also reviewed his medications today. he has been encouraged to call the office with any questions or concerns that should arise related to todays visit.  Diabetes Counseling:  1. Addition of ACE inh/ ARB'S for nephroprotection. 2. Diabetic foot care, prevention of complications.  3.Exercise and lose weight.  4. Diabetic eye examination, 5. Monitor blood sugar closlely. nutrition counseling.  6.Sign and symptoms of hypoglycemia including shaking sweating,confusion and headaches.   This patient was seen by Leretha Pol, FNP- C in Collaboration with Dr Lavera Guise as a part of collaborative care agreement    Orders Placed This Encounter  Procedures  . POCT HgB A1C    Meds ordered this encounter  Medications  . insulin glargine (LANTUS) 100 UNIT/ML injection    Sig: Inject 0.6 mLs (60 Units total) into the skin daily.    Dispense:  10 mL    Refill:  11    Please note increase in dosing.    Order Specific Question:   Supervising Provider    Answer:   Lavera Guise [[id number]]    Time spent: 53 Minutes          Dr Lavera Guise Internal medicine

## 2017-09-27 NOTE — Progress Notes (Signed)
Patient came in asking for samples of by[place] and lantus. Patient is unable to fill rx til Friday. Patient was given two samples one of byduroen and lantus

## 2017-10-17 DIAGNOSIS — G8929 Other chronic pain: Secondary | ICD-10-CM | POA: Diagnosis not present

## 2017-10-17 DIAGNOSIS — M25561 Pain in right knee: Secondary | ICD-10-CM | POA: Diagnosis not present

## 2017-10-24 DIAGNOSIS — M25561 Pain in right knee: Secondary | ICD-10-CM | POA: Diagnosis not present

## 2017-10-24 DIAGNOSIS — M1712 Unilateral primary osteoarthritis, left knee: Secondary | ICD-10-CM | POA: Diagnosis not present

## 2017-10-24 DIAGNOSIS — G8929 Other chronic pain: Secondary | ICD-10-CM | POA: Diagnosis not present

## 2017-10-24 DIAGNOSIS — M1731 Unilateral post-traumatic osteoarthritis, right knee: Secondary | ICD-10-CM | POA: Diagnosis not present

## 2017-11-08 ENCOUNTER — Telehealth: Payer: Self-pay

## 2017-11-08 NOTE — Telephone Encounter (Signed)
Patient advised that his patient assitance medication Bydureon is ready for pickup.tat

## 2017-11-23 ENCOUNTER — Encounter (INDEPENDENT_AMBULATORY_CARE_PROVIDER_SITE_OTHER): Payer: Self-pay

## 2017-11-23 ENCOUNTER — Encounter: Payer: Self-pay | Admitting: Internal Medicine

## 2017-11-23 ENCOUNTER — Ambulatory Visit: Payer: Medicare HMO | Admitting: Internal Medicine

## 2017-11-23 VITALS — BP 144/88 | HR 64 | Resp 16 | Ht 69.0 in | Wt 307.8 lb

## 2017-11-23 DIAGNOSIS — J449 Chronic obstructive pulmonary disease, unspecified: Secondary | ICD-10-CM

## 2017-11-23 DIAGNOSIS — R0602 Shortness of breath: Secondary | ICD-10-CM

## 2017-11-23 DIAGNOSIS — Z9989 Dependence on other enabling machines and devices: Secondary | ICD-10-CM

## 2017-11-23 DIAGNOSIS — Z6841 Body Mass Index (BMI) 40.0 and over, adult: Secondary | ICD-10-CM | POA: Diagnosis not present

## 2017-11-23 DIAGNOSIS — G4733 Obstructive sleep apnea (adult) (pediatric): Secondary | ICD-10-CM

## 2017-11-23 NOTE — Progress Notes (Signed)
[facility] [place], [place] [address]  Internal MEDICINE  Office Visit Note  Patient Name: Victor Wise  [id number]  [id number]  Date of Service: 11/30/2017  Chief Complaint  Patient presents with  . Sleep Apnea    on cpap  . COPD    COPD  He complains of shortness of breath. There is no cough. This is a chronic problem. The current episode started more than 1 year ago. The problem occurs daily. The problem has been waxing and waning. Associated symptoms include postnasal drip. Pertinent negatives include no chest pain, rhinorrhea, sneezing or sore throat. His symptoms are aggravated by climbing stairs and change in weather. He reports moderate improvement on treatment. His symptoms are not alleviated by ipratropium and steroid inhaler. His past medical history is significant for COPD.  Other  This is a chronic (CPAP therapy) problem. Pertinent negatives include no abdominal pain, arthralgias, chest pain, chills, congestion, coughing, fatigue, joint swelling, nausea, neck pain, numbness, rash, sore throat or vomiting.   Patient is here for routine follow up for obstructive sleep apnea. Compliance with CPAP is good.  Sleeps well with aide. Patient has follow up with sleep clinic for downloads No issues with mask or tubing placement. Does follow up with sleep clinic, pt struggles with weight   Current Medication: Outpatient Encounter Medications as of 11/23/2017  Medication Sig  . aspirin EC 81 MG tablet Take 81 mg by mouth daily.  [name] buPROPion (WELLBUTRIN XL) 300 MG 24 hr tablet Take 300 mg by mouth daily.   . busPIRone (BUSPAR) 10 MG tablet Take 10 mg by mouth at bedtime.  . clopidogrel (PLAVIX) 75 MG tablet Take 1 tablet (75 mg total) by mouth daily.  . DULoxetine (CYMBALTA) 30 MG capsule Take 30-60 mg by mouth 2 (two) times daily. Pt takes two capsules in the morning and one at night.  . Exenatide ER (BYDUREON) 2 MG PEN Inject 2 mg into the skin once a  week.  . fenofibrate 160 MG tablet Take 1 tablet (160 mg total) by mouth every evening.  . fenofibrate 54 MG tablet TAKE 1 TABLET BY MOUTH A DAY AT SUPPERTIME  . finasteride (PROSCAR) 5 MG tablet Take 1 tablet (5 mg total) by mouth daily.  . fluticasone (FLONASE) 50 MCG/ACT nasal spray Place 2 sprays into both nostrils daily.  . hydrochlorothiazide (HYDRODIURIL) 12.5 MG tablet Take 1 tablet (12.5 mg total) by mouth daily.  . insulin glargine (LANTUS) 100 UNIT/ML injection Inject 0.6 mLs (60 Units total) into the skin daily.  . insulin regular (NOVOLIN R) 100 units/mL injection Patient to use Novolin R [place] TiD prior to meals per sliding scale instructions. Max daily dose is 45 units per day.  . INSULIN SYRINGE 1CC/29G 29G X 1/2" 1 ML MISC Insulin injections QID, use with lantus and regular insulin. DX E11.65  . NARCAN 4 MG/0.1ML LIQD Take 4 mg by mouth as needed (for opioid overdose).   . nitroGLYCERIN (NITROSTAT) 0.4 MG SL tablet Place 1 tablet (0.4 mg total) under the tongue every 5 (five) minutes x 3 doses as needed for chest pain.  [name] omega-3 acid ethyl esters (LOVAZA) 1 g capsule Take 1 capsule (1 g total) by mouth 2 (two) times daily.  . Oxcarbazepine (TRILEPTAL) 300 MG tablet Take 300-600 mg by mouth 2 (two) times daily. Pt takes one tablet in the morning and two at night.  [name] oxyCODONE (OXY IR/ROXICODONE) 5 MG immediate release tablet Take 1 tablet (  5 mg total) by mouth 3 (three) times daily as needed for severe pain.  [name] oxyCODONE (OXYCONTIN) 20 mg 12 hr tablet Take 1 tablet (20 mg total) by mouth every 12 (twelve) hours.  . pantoprazole (PROTONIX) 40 MG tablet TAKE 1 TABLET BY MOUTH A DAY FOR REFLUX  . polyethylene glycol (MIRALAX / GLYCOLAX) packet Take 17 g by mouth daily as needed.  . pregabalin (LYRICA) 200 MG capsule Take 200 mg by mouth 3 (three) times daily. Pt takes two capsules in the morning and one at night.  . senna (SENOKOT) 8.6 MG TABS tablet Take 1 tablet (8.6 mg total) by mouth  2 (two) times daily.  . tamsulosin (FLOMAX) 0.4 MG CAPS capsule Take 1 capsule (0.4 mg total) by mouth daily after supper.  . [DISCONTINUED] benazepril (LOTENSIN) 20 MG tablet TAKE 1 TABLET BY MOUTH TWICE DAILY  . [DISCONTINUED] amLODipine (NORVASC) 5 MG tablet Take 1 tablet (5 mg total) by mouth daily. (Patient not taking: Reported on 11/23/2017)   No facility-administered encounter medications on file as of 11/23/2017.     Surgical History: Past Surgical History:  Procedure Laterality Date  . CARDIAC CATHETERIZATION N/A 12/19/2014   Procedure: Coronary Stent Intervention;  Surgeon: Charolette Forward, MD;  Location: [place] [facility];  Service: Cardiovascular;  Laterality: N/A;  . CARDIAC CATHETERIZATION Left 12/19/2014   Procedure: Left Heart Cath and Coronary Angiography;  Surgeon: Dionisio Hyun, MD;  Location: [place] [facility];  Service: Cardiovascular;  Laterality: Left;  . PACEMAKER INSERTION Left 11/02/2015   Procedure: INSERTION PACEMAKER;  Surgeon: Isaias Cowman, MD;  Location: [facility];  Service: Cardiovascular;  Laterality: Left;  . Pain Stimulator    . Right toe amputation      Medical History: Past Medical History:  Diagnosis Date  . Allergy   . Basal cell carcinoma    forehead  . BPH (benign prostatic hyperplasia)   . Chronic back pain   . Depression   . Diabetes ([place])   . GERD (gastroesophageal reflux disease)   . Heart attack ([place])   . Hypertension   . Morbid obesity ([place])   . Obstructive sleep apnea   . 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 Status post insertion of spinal cord stimulator   . Stroke ([place])   . UTI (lower urinary tract infection)     Family History: Family History  Problem Relation Age of Onset  . Breast cancer Mother   . Cancer Mother   . Hypertension Mother   . Lung cancer Father   . Hypertension Father   . Heart disease Father   . Cancer Father   . Kidney disease Sister   . Prostate cancer Neg Hx     Social History   Socioeconomic  History  . Marital status: Divorced    Spouse name: Not on file  . Number of children: Not on file  . Years of education: Not on file  . Highest education level: Not on file  Occupational History  . Not on file  Social Needs  . Financial resource strain: Not on file  . Food insecurity:    Worry: Not on file    Inability: Not on file  . Transportation needs:    Medical: Not on file    Non-medical: Not on file  Tobacco Use  . Smoking status: Former Smoker    Types: Cigarettes  . Smokeless tobacco: Never Used  . Tobacco comment: quit 45 years  Substance and Sexual Activity  .  Alcohol use: Yes    Alcohol/week: 0.0 oz    Comment: occasionally  . Drug use: No  . Sexual activity: Not on file  Lifestyle  . Physical activity:    Days per week: Not on file    Minutes per session: Not on file  . Stress: Not on file  Relationships  . Social connections:    Talks on phone: Not on file    Gets together: Not on file    Attends religious service: Not on file    Active member of club or organization: Not on file    Attends meetings of clubs or organizations: Not on file    Relationship status: Not on file  . Intimate partner violence:    Fear of current or ex partner: Not on file    Emotionally abused: Not on file    Physically abused: Not on file    Forced sexual activity: Not on file  Other Topics Concern  . Not on file  Social History Narrative  . Not on file    Review of Systems  Constitutional: Negative for chills, fatigue and unexpected weight change.  HENT: Positive for postnasal drip. Negative for congestion, rhinorrhea, sneezing and sore throat.   Eyes: Negative for redness.  Respiratory: Positive for shortness of breath. Negative for cough and chest tightness.   Cardiovascular: Negative for chest pain and palpitations.  Gastrointestinal: Negative for abdominal pain, constipation, diarrhea, nausea and vomiting.  Genitourinary: Negative for dysuria and frequency.   Musculoskeletal: Negative for arthralgias, back pain, joint swelling and neck pain.  Skin: Negative for rash.  Neurological: Negative.  Negative for tremors and numbness.  Hematological: Negative for adenopathy. Does not bruise/bleed easily.  Psychiatric/Behavioral: Negative for behavioral problems (Depression), sleep disturbance and suicidal ideas. The patient is not nervous/anxious.     Vital Signs: BP (!) 144/88   Pulse 64   Resp 16   Ht 5\' 9"  (1.753 m)   Wt (!) 307 lb 12.8 oz (139.6 kg)   SpO2 94%   BMI 45.45 kg/m   Physical Exam  Constitutional: He appears well-developed and well-nourished.  HENT:  Head: Atraumatic.  Eyes: Pupils are equal, round, and reactive to light. EOM are normal.  Cardiovascular: Normal rate and regular rhythm.  Pulmonary/Chest: Effort normal.   Assessment/Plan: 1. SOB (shortness of breath) - Spirometry with Graph  2. Obstructive chronic bronchitis without exacerbation (HCC) - Conntrolled   3. OSA on CPAP -Continue as before   4. BMI 40.0-44.9, adult [facility]) - Discussed with pt   5. Morbid obesity (HCC) Obesity Counseling: Risk Assessment: An assessment of behavioral risk factors was made today and includes lack of exercise sedentary lifestyle, lack of portion control and poor dietary habits.  Risk Modification Advice: She was counseled on portion control guidelines. Restricting daily caloric intake to. . The detrimental long term effects of obesity on her health and ongoing poor compliance was also discussed with the patient.( TIME SPENT 5 MIN)   General Counseling: Victor Wise understanding of the findings of todays visit and agrees with plan of treatment. I have discussed any further diagnostic evaluation that may be needed or ordered today. We also reviewed his medications today. he has been encouraged to call the office with any questions or concerns that should arise related to todays visit.  Orders Placed This Encounter   Procedures  . Spirometry with Graph     Time spent:20 Minutes  Dr Lavera Guise Internal medicine

## 2017-11-24 ENCOUNTER — Other Ambulatory Visit: Payer: Self-pay | Admitting: Internal Medicine

## 2017-11-24 MED ORDER — BENAZEPRIL HCL 20 MG PO TABS: 20.0000 mg | ORAL_TABLET | Freq: Two times a day (BID) | ORAL | 2 refills | Status: DC

## 2017-12-04 ENCOUNTER — Other Ambulatory Visit: Payer: Self-pay | Admitting: Internal Medicine

## 2017-12-04 ENCOUNTER — Other Ambulatory Visit: Payer: Self-pay | Admitting: Nurse Practitioner

## 2017-12-04 MED ORDER — FENOFIBRATE 160 MG PO TABS: 160.0000 mg | ORAL_TABLET | Freq: Every evening | ORAL | 0 refills | Status: DC

## 2017-12-04 MED ORDER — FENOFIBRATE 54 MG PO TABS: ORAL_TABLET | ORAL | 2 refills | Status: DC

## 2017-12-05 ENCOUNTER — Encounter: Payer: Self-pay | Admitting: Adult Health

## 2017-12-05 ENCOUNTER — Ambulatory Visit (INDEPENDENT_AMBULATORY_CARE_PROVIDER_SITE_OTHER): Payer: Medicare HMO | Admitting: Adult Health

## 2017-12-05 ENCOUNTER — Other Ambulatory Visit: Payer: Self-pay | Admitting: Adult Health

## 2017-12-05 VITALS — BP 162/110 | HR 81 | Resp 18 | Ht 69.0 in | Wt 312.6 lb

## 2017-12-05 DIAGNOSIS — E1165 Type 2 diabetes mellitus with hyperglycemia: Secondary | ICD-10-CM

## 2017-12-05 DIAGNOSIS — I10 Essential (primary) hypertension: Secondary | ICD-10-CM

## 2017-12-05 LAB — POCT GLYCOSYLATED HEMOGLOBIN (HGB A1C): Hemoglobin A1C: 9.2 % — AB (ref 4.0–5.6)

## 2017-12-05 MED ORDER — PNEUMOCOCCAL 13-VAL CONJ VACC IM SUSP: 0.5000 mL | INTRAMUSCULAR | 0 refills | Status: AC

## 2017-12-05 NOTE — Patient Instructions (Signed)
Diabetes Mellitus and Nutrition When you have diabetes (diabetes mellitus), it is very important to have healthy eating habits because your blood sugar (glucose) levels are greatly affected by what you eat and drink. Eating healthy foods in the appropriate amounts, at about the same times every day, can help you:  Control your blood glucose.  Lower your risk of heart disease.  Improve your blood pressure.  Reach or maintain a healthy weight.  Every person with diabetes is different, and each person has different needs for a meal plan. Your health care provider may recommend that you work with a diet and nutrition specialist (dietitian) to make a meal plan that is best for you. Your meal plan may vary depending on factors such as:  The calories you need.  The medicines you take.  Your weight.  Your blood glucose, blood pressure, and cholesterol levels.  Your activity level.  Other health conditions you have, such as heart or kidney disease.  How do carbohydrates affect me? Carbohydrates affect your blood glucose level more than any other type of food. Eating carbohydrates naturally increases the amount of glucose in your blood. Carbohydrate counting is a method for keeping track of how many carbohydrates you eat. Counting carbohydrates is important to keep your blood glucose at a healthy level, especially if you use insulin or take certain oral diabetes medicines. It is important to know how many carbohydrates you can safely have in each meal. This is different for every person. Your dietitian can help you calculate how many carbohydrates you should have at each meal and for snack. Foods that contain carbohydrates include:  Bread, cereal, rice, pasta, and crackers.  Potatoes and corn.  Peas, beans, and lentils.  Milk and yogurt.  Fruit and juice.  Desserts, such as cakes, cookies, ice cream, and candy.  How does alcohol affect me? Alcohol can cause a sudden decrease in blood  glucose (hypoglycemia), especially if you use insulin or take certain oral diabetes medicines. Hypoglycemia can be a life-threatening condition. Symptoms of hypoglycemia (sleepiness, dizziness, and confusion) are similar to symptoms of having too much alcohol. If your health care provider says that alcohol is safe for you, follow these guidelines:  Limit alcohol intake to no more than 1 drink per day for nonpregnant women and 2 drinks per day for men. One drink equals 12 oz of beer, 5 oz of wine, or 1 oz of hard liquor.  Do not drink on an empty stomach.  Keep yourself hydrated with water, diet soda, or unsweetened iced tea.  Keep in mind that regular soda, juice, and other mixers may contain a lot of sugar and must be counted as carbohydrates.  What are tips for following this plan? Reading food labels  Start by checking the serving size on the label. The amount of calories, carbohydrates, fats, and other nutrients listed on the label are based on one serving of the food. Many foods contain more than one serving per package.  Check the total grams (g) of carbohydrates in one serving. You can calculate the number of servings of carbohydrates in one serving by dividing the total carbohydrates by 15. For example, if a food has 30 g of total carbohydrates, it would be equal to 2 servings of carbohydrates.  Check the number of grams (g) of saturated and trans fats in one serving. Choose foods that have low or no amount of these fats.  Check the number of milligrams (mg) of sodium in one serving. Most people   should limit total sodium intake to less than 2,300 mg per day.  Always check the nutrition information of foods labeled as "low-fat" or "nonfat". These foods may be higher in added sugar or refined carbohydrates and should be avoided.  Talk to your dietitian to identify your daily goals for nutrients listed on the label. Shopping  Avoid buying canned, premade, or processed foods. These  foods tend to be high in fat, sodium, and added sugar.  Shop around the outside edge of the grocery store. This includes fresh fruits and vegetables, bulk grains, fresh meats, and fresh dairy. Cooking  Use low-heat cooking methods, such as baking, instead of high-heat cooking methods like deep frying.  Cook using healthy oils, such as olive, canola, or sunflower oil.  Avoid cooking with butter, cream, or high-fat meats. Meal planning  Eat meals and snacks regularly, preferably at the same times every day. Avoid going long periods of time without eating.  Eat foods high in fiber, such as fresh fruits, vegetables, beans, and whole grains. Talk to your dietitian about how many servings of carbohydrates you can eat at each meal.  Eat 4-6 ounces of lean protein each day, such as lean meat, chicken, fish, eggs, or tofu. 1 ounce is equal to 1 ounce of meat, chicken, or fish, 1 egg, or 1/4 cup of tofu.  Eat some foods each day that contain healthy fats, such as avocado, nuts, seeds, and fish. Lifestyle   Check your blood glucose regularly.  Exercise at least 30 minutes 5 or more days each week, or as told by your health care provider.  Take medicines as told by your health care provider.  Do not use any products that contain nicotine or tobacco, such as cigarettes and e-cigarettes. If you need help quitting, ask your health care provider.  Work with a counselor or diabetes educator to identify strategies to manage stress and any emotional and social challenges. What are some questions to ask my health care provider?  Do I need to meet with a diabetes educator?  Do I need to meet with a dietitian?  What number can I call if I have questions?  When are the best times to check my blood glucose? Where to find more information:  American Diabetes Association: diabetes.org/food-and-fitness/food  Academy of Nutrition and Dietetics:  www.eatright.org/resources/health/diseases-and-conditions/diabetes  National Institute of Diabetes and Digestive and Kidney Diseases (NIH): www.niddk.nih.gov/health-information/diabetes/overview/diet-eating-physical-activity Summary  A healthy meal plan will help you control your blood glucose and maintain a healthy lifestyle.  Working with a diet and nutrition specialist (dietitian) can help you make a meal plan that is best for you.  Keep in mind that carbohydrates and alcohol have immediate effects on your blood glucose levels. It is important to count carbohydrates and to use alcohol carefully. This information is not intended to replace advice given to you by your health care provider. Make sure you discuss any questions you have with your health care provider. Document Released: 01/27/2005 Document Revised: 06/06/2016 Document Reviewed: 06/06/2016 Elsevier Interactive Patient Education  2018 Elsevier Inc.  

## 2017-12-05 NOTE — Progress Notes (Signed)
[facility] [place], [place] [address]  Internal MEDICINE  Office Visit Note  Patient Name: Victor Wise  [id number]  [id number]  Date of Service: 12/05/2017  Chief Complaint  Patient presents with  . Diabetes  . Hypertension  . COPD    HPI Pt here for follow up for Diabetes.  He has gained nearly 30 pounds in the last 6 months.  He has been struggling with right knee pain recently.  The orthopedist wants him to have gel injections in his knee, but he can not afford that at this time.  This has lead to a very sedentary existence for him. His A1C continues to come down.  Today he is 9.2.    Current Medication: Outpatient Encounter Medications as of 12/05/2017  Medication Sig  . aspirin EC 81 MG tablet Take 81 mg by mouth daily.  . benazepril (LOTENSIN) 20 MG tablet Take 1 tablet (20 mg total) by mouth 2 (two) times daily.  [name] buPROPion (WELLBUTRIN XL) 300 MG 24 hr tablet Take 300 mg by mouth daily.   . busPIRone (BUSPAR) 10 MG tablet Take 10 mg by mouth at bedtime.  . clopidogrel (PLAVIX) 75 MG tablet Take 1 tablet (75 mg total) by mouth daily.  . DULoxetine (CYMBALTA) 30 MG capsule Take 30-60 mg by mouth 2 (two) times daily. Pt takes two capsules in the morning and one at night.  . Exenatide ER (BYDUREON) 2 MG PEN Inject 2 mg into the skin once a week.  . fenofibrate 160 MG tablet Take 1 tablet (160 mg total) by mouth every evening.  . fenofibrate 54 MG tablet TAKE 1 TABLET BY MOUTH A DAY AT SUPPERTIME  . finasteride (PROSCAR) 5 MG tablet Take 1 tablet (5 mg total) by mouth daily.  . fluticasone (FLONASE) 50 MCG/ACT nasal spray Place 2 sprays into both nostrils daily.  . hydrochlorothiazide (HYDRODIURIL) 12.5 MG tablet Take 1 tablet (12.5 mg total) by mouth daily.  . insulin glargine (LANTUS) 100 UNIT/ML injection Inject 0.6 mLs (60 Units total) into the skin daily.  . insulin regular (NOVOLIN R) 100 units/mL injection Patient to use Novolin R Star TiD prior  to meals per sliding scale instructions. Max daily dose is 45 units per day.  . INSULIN SYRINGE 1CC/29G 29G X 1/2" 1 ML MISC Insulin injections QID, use with lantus and regular insulin. DX E11.65  . NARCAN 4 MG/0.1ML LIQD Take 4 mg by mouth as needed (for opioid overdose).   . nitroGLYCERIN (NITROSTAT) 0.4 MG SL tablet Place 1 tablet (0.4 mg total) under the tongue every 5 (five) minutes x 3 doses as needed for chest pain.  [name] omega-3 acid ethyl esters (LOVAZA) 1 g capsule Take 1 capsule (1 g total) by mouth 2 (two) times daily.  . Oxcarbazepine (TRILEPTAL) 300 MG tablet Take 300-600 mg by mouth 2 (two) times daily. Pt takes one tablet in the morning and two at night.  [name] oxyCODONE (OXY IR/ROXICODONE) 5 MG immediate release tablet Take 1 tablet (5 mg total) by mouth 3 (three) times daily as needed for severe pain.  [name] oxyCODONE (OXYCONTIN) 20 mg 12 hr tablet Take 1 tablet (20 mg total) by mouth every 12 (twelve) hours.  . pantoprazole (PROTONIX) 40 MG tablet TAKE 1 TABLET BY MOUTH A DAY FOR REFLUX  . pneumococcal 13-valent conjugate vaccine (PREVNAR 13) SUSP injection Inject 0.5 mLs into the muscle tomorrow at 10 am for 1 dose.  . polyethylene glycol (MIRALAX / GLYCOLAX)  packet Take 17 g by mouth daily as needed.  . pregabalin (LYRICA) 200 MG capsule Take 200 mg by mouth 3 (three) times daily. Pt takes two capsules in the morning and one at night.  . senna (SENOKOT) 8.6 MG TABS tablet Take 1 tablet (8.6 mg total) by mouth 2 (two) times daily.  . tamsulosin (FLOMAX) 0.4 MG CAPS capsule Take 1 capsule (0.4 mg total) by mouth daily after supper.   No facility-administered encounter medications on file as of 12/05/2017.     Surgical History: Past Surgical History:  Procedure Laterality Date  . CARDIAC CATHETERIZATION N/A 12/19/2014   Procedure: Coronary Stent Intervention;  Surgeon: Charolette Forward, MD;  Location: [facility];  Service: Cardiovascular;  Laterality: N/A;  . CARDIAC CATHETERIZATION  Left 12/19/2014   Procedure: Left Heart Cath and Coronary Angiography;  Surgeon: Dionisio Ladanian, MD;  Location: [facility];  Service: Cardiovascular;  Laterality: Left;  . PACEMAKER INSERTION Left 11/02/2015   Procedure: INSERTION PACEMAKER;  Surgeon: Isaias Cowman, MD;  Location: [facility];  Service: Cardiovascular;  Laterality: Left;  . Pain Stimulator    . Right toe amputation      Medical History: Past Medical History:  Diagnosis Date  . Allergy   . Basal cell carcinoma    forehead  . BPH (benign prostatic hyperplasia)   . Chronic back pain   . Depression   . Diabetes ([place])   . GERD (gastroesophageal reflux disease)   . Heart attack ([place])   . Hypertension   . Morbid obesity ([place])   . Obstructive sleep apnea   . 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 Status post insertion of spinal cord stimulator   . Stroke ([place])   . UTI (lower urinary tract infection)     Family History: Family History  Problem Relation Age of Onset  . Breast cancer Mother   . Cancer Mother   . Hypertension Mother   . Lung cancer Father   . Hypertension Father   . Heart disease Father   . Cancer Father   . Kidney disease Sister   . Prostate cancer Neg Hx     Social History   Socioeconomic History  . Marital status: Divorced    Spouse name: Not on file  . Number of children: Not on file  . Years of education: Not on file  . Highest education level: Not on file  Occupational History  . Not on file  Social Needs  . Financial resource strain: Not on file  . Food insecurity:    Worry: Not on file    Inability: Not on file  . Transportation needs:    Medical: Not on file    Non-medical: Not on file  Tobacco Use  . Smoking status: Former Smoker    Types: Cigarettes  . Smokeless tobacco: Never Used  . Tobacco comment: quit 45 years  Substance and Sexual Activity  . Alcohol use: Yes    Alcohol/week: 0.0 oz    Comment: occasionally  . Drug use: No  . Sexual activity: Not on file   Lifestyle  . Physical activity:    Days per week: Not on file    Minutes per session: Not on file  . Stress: Not on file  Relationships  . Social connections:    Talks on phone: Not on file    Gets together: Not on file    Attends religious service: Not on file    Active member of  club or organization: Not on file    Attends meetings of clubs or organizations: Not on file    Relationship status: Not on file  . Intimate partner violence:    Fear of current or ex partner: Not on file    Emotionally abused: Not on file    Physically abused: Not on file    Forced sexual activity: Not on file  Other Topics Concern  . Not on file  Social History Narrative  . Not on file      Review of Systems  Constitutional: Negative.  Negative for chills, fatigue and unexpected weight change.  HENT: Negative.  Negative for congestion, rhinorrhea, sneezing and sore throat.   Eyes: Negative for redness.  Respiratory: Negative.  Negative for cough, chest tightness and shortness of breath.   Cardiovascular: Negative.  Negative for chest pain and palpitations.  Gastrointestinal: Negative.  Negative for abdominal pain, constipation, diarrhea, nausea and vomiting.  Endocrine: Negative.   Genitourinary: Negative.  Negative for dysuria and frequency.  Musculoskeletal: Negative.  Negative for arthralgias, back pain, joint swelling and neck pain.  Skin: Negative.  Negative for rash.  Allergic/Immunologic: Negative.   Neurological: Negative.  Negative for tremors and numbness.  Hematological: Negative for adenopathy. Does not bruise/bleed easily.  Psychiatric/Behavioral: Negative.  Negative for behavioral problems, sleep disturbance and suicidal ideas. The patient is not nervous/anxious.     Vital Signs: BP (!) 162/110   Pulse 81   Resp 18   Ht 5\' 9"  (1.753 m)   Wt (!) 312 lb 9.6 oz (141.8 kg)   SpO2 94%   BMI 46.16 kg/m    Physical Exam  Constitutional: He is oriented to person, place, and  time. He appears well-developed and well-nourished. No distress.  HENT:  Head: Normocephalic and atraumatic.  Mouth/Throat: Oropharynx is clear and moist. No oropharyngeal exudate.  Eyes: Pupils are equal, round, and reactive to light. EOM are normal.  Neck: Normal range of motion. Neck supple. No JVD present. No tracheal deviation present. No thyromegaly present.  Cardiovascular: Normal rate, regular rhythm and normal heart sounds. Exam reveals no gallop and no friction rub.  No murmur heard. Pulmonary/Chest: Effort normal and breath sounds normal. No respiratory distress. He has no wheezes. He has no rales. He exhibits no tenderness.  Abdominal: Soft. There is no tenderness. There is no guarding.  Musculoskeletal: Normal range of motion.  Lymphadenopathy:    He has no cervical adenopathy.  Neurological: He is alert and oriented to person, place, and time. No cranial nerve deficit.  Skin: Skin is warm and dry. He is not diaphoretic.  Psychiatric: He has a normal mood and affect. His behavior is normal. Judgment and thought content normal.  Nursing note and vitals reviewed.   Assessment/Plan: 1. Uncontrolled type 2 diabetes mellitus with hyperglycemia (HCC) AlC decreasing. Pt has been taking 50 units of Lantus due to confusion about order.  He will increase to 60units daily as of today.  Continue Bydurian and regular insulin as previously ordered.   - POCT HgB A1C  2. Essential hypertension BP recheck 130/90. Continue taking medications as prescribed.   3. Morbid obesity ([place]) Obesity Counseling: Risk Assessment: An assessment of behavioral risk factors was made today and includes lack of exercise sedentary lifestyle, lack of portion control and poor dietary habits.  Risk Modification Advice: 10 min was counseled on portion control guidelines. Restricting daily caloric intake to. . The detrimental long term effects of obesity on her health and ongoing poor  compliance was also discussed  with the patient.  General Counseling: owyn raulston understanding of the findings of todays visit and agrees with plan of treatment. I have discussed any further diagnostic evaluation that may be needed or ordered today. We also reviewed his medications today. he has been encouraged to call the office with any questions or concerns that should arise related to todays visit.    Orders Placed This Encounter  Procedures  . POCT HgB A1C   Time spent: 25 Minutes   This patient was seen by Orson Gear AGNP-C in Collaboration with Dr Lavera Guise as a part of collaborative care agreement    Dr Lavera Guise Internal medicine

## 2017-12-06 ENCOUNTER — Other Ambulatory Visit: Payer: Self-pay

## 2017-12-06 MED ORDER — FENOFIBRATE 54 MG PO TABS: ORAL_TABLET | ORAL | 5 refills | Status: DC

## 2017-12-22 ENCOUNTER — Other Ambulatory Visit: Payer: Self-pay

## 2017-12-22 MED ORDER — INSULIN PEN NEEDLE 31G X 5 MM MISC: 3 refills | Status: DC

## 2017-12-26 ENCOUNTER — Other Ambulatory Visit: Payer: Self-pay

## 2017-12-26 MED ORDER — PANTOPRAZOLE SODIUM 40 MG PO TBEC: DELAYED_RELEASE_TABLET | ORAL | 3 refills | Status: DC

## 2017-12-26 MED ORDER — INSULIN PEN NEEDLE 31G X 5 MM MISC: 3 refills | Status: DC

## 2017-12-27 ENCOUNTER — Other Ambulatory Visit: Payer: Self-pay

## 2017-12-27 MED ORDER — TAMSULOSIN HCL 0.4 MG PO CAPS: 0.4000 mg | ORAL_CAPSULE | Freq: Every day | ORAL | 3 refills | Status: DC

## 2017-12-28 ENCOUNTER — Other Ambulatory Visit: Payer: Self-pay

## 2017-12-28 DIAGNOSIS — E1165 Type 2 diabetes mellitus with hyperglycemia: Secondary | ICD-10-CM

## 2017-12-28 DIAGNOSIS — E782 Mixed hyperlipidemia: Secondary | ICD-10-CM

## 2017-12-28 MED ORDER — "INSULIN SYRINGE 29G X 1/2"" 1 ML MISC": 5 refills | Status: DC

## 2017-12-28 MED ORDER — OMEGA-3-ACID ETHYL ESTERS 1 G PO CAPS: 1.0000 g | ORAL_CAPSULE | Freq: Two times a day (BID) | ORAL | 5 refills | Status: DC

## 2018-01-04 ENCOUNTER — Telehealth: Payer: Self-pay

## 2018-01-04 NOTE — Telephone Encounter (Signed)
Patient assistance application for Bydureon has been approved thru 05/15/18. Victor Wise  * I reordered his Bydureon 01/04/18; due to upcoming holdiay

## 2018-01-08 ENCOUNTER — Other Ambulatory Visit: Payer: Self-pay

## 2018-01-08 ENCOUNTER — Other Ambulatory Visit: Payer: Self-pay | Admitting: Internal Medicine

## 2018-01-08 DIAGNOSIS — E1165 Type 2 diabetes mellitus with hyperglycemia: Secondary | ICD-10-CM

## 2018-01-08 MED ORDER — "INSULIN SYRINGE-NEEDLE U-100 31G X 5/16"" 0.3 ML MISC": 3 refills | Status: DC

## 2018-01-08 MED ORDER — INSULIN SYRINGE-NEEDLE U-100 31G X 5/16" 0.3 ML MISC
3 refills | Status: DC
Start: 2018-01-08 — End: 2019-04-10

## 2018-01-09 ENCOUNTER — Telehealth: Payer: Self-pay

## 2018-01-09 NOTE — Telephone Encounter (Signed)
Left message on pt voicemail informing him that his bydureon injection pens where here and ready for pick up

## 2018-01-09 NOTE — Telephone Encounter (Signed)
PATIENT ASSISTANCE FOR LANTUS HAS BEEN APPROVED... SHIPMENT IN PROGRESS.TAT

## 2018-01-11 ENCOUNTER — Telehealth: Payer: Self-pay

## 2018-01-11 NOTE — Telephone Encounter (Signed)
Left message advising patient that his patient assistance for Lantus is ready for pickup/tat

## 2018-01-12 DIAGNOSIS — M47814 Spondylosis without myelopathy or radiculopathy, thoracic region: Secondary | ICD-10-CM | POA: Diagnosis not present

## 2018-01-12 DIAGNOSIS — Z9689 Presence of other specified functional implants: Secondary | ICD-10-CM | POA: Insufficient documentation

## 2018-01-12 DIAGNOSIS — Z969 Presence of functional implant, unspecified: Secondary | ICD-10-CM | POA: Diagnosis not present

## 2018-01-23 DIAGNOSIS — I110 Hypertensive heart disease with heart failure: Secondary | ICD-10-CM | POA: Diagnosis not present

## 2018-01-23 DIAGNOSIS — I10 Essential (primary) hypertension: Secondary | ICD-10-CM | POA: Diagnosis not present

## 2018-01-23 DIAGNOSIS — I2510 Atherosclerotic heart disease of native coronary artery without angina pectoris: Secondary | ICD-10-CM | POA: Diagnosis not present

## 2018-01-23 DIAGNOSIS — E782 Mixed hyperlipidemia: Secondary | ICD-10-CM | POA: Diagnosis not present

## 2018-01-23 DIAGNOSIS — I5032 Chronic diastolic (congestive) heart failure: Secondary | ICD-10-CM | POA: Diagnosis not present

## 2018-02-13 ENCOUNTER — Telehealth: Payer: Self-pay

## 2018-02-13 NOTE — Telephone Encounter (Signed)
Lmom to call us back for samples

## 2018-03-01 ENCOUNTER — Encounter: Payer: Self-pay | Admitting: *Deleted

## 2018-03-01 ENCOUNTER — Other Ambulatory Visit: Payer: Self-pay

## 2018-03-01 ENCOUNTER — Emergency Department: Payer: Medicare HMO

## 2018-03-01 ENCOUNTER — Other Ambulatory Visit: Payer: Self-pay | Admitting: Internal Medicine

## 2018-03-01 ENCOUNTER — Inpatient Hospital Stay
Admission: EM | Admit: 2018-03-01 | Discharge: 2018-03-13 | DRG: 629 | Disposition: A | Discharge status: Skilled Nursing Facility | Payer: Medicare HMO | Attending: Family Medicine | Admitting: Family Medicine

## 2018-03-01 DIAGNOSIS — R600 Localized edema: Secondary | ICD-10-CM | POA: Diagnosis not present

## 2018-03-01 DIAGNOSIS — Z888 Allergy status to other drugs, medicaments and biological substances status: Secondary | ICD-10-CM

## 2018-03-01 DIAGNOSIS — L97909 Non-pressure chronic ulcer of unspecified part of unspecified lower leg with unspecified severity: Secondary | ICD-10-CM | POA: Diagnosis not present

## 2018-03-01 DIAGNOSIS — Z9682 Presence of neurostimulator: Secondary | ICD-10-CM

## 2018-03-01 DIAGNOSIS — Z886 Allergy status to analgesic agent status: Secondary | ICD-10-CM

## 2018-03-01 DIAGNOSIS — Z803 Family history of malignant neoplasm of breast: Secondary | ICD-10-CM

## 2018-03-01 DIAGNOSIS — K5900 Constipation, unspecified: Secondary | ICD-10-CM | POA: Diagnosis present

## 2018-03-01 DIAGNOSIS — B9620 Unspecified Escherichia coli [E. coli] as the cause of diseases classified elsewhere: Secondary | ICD-10-CM | POA: Diagnosis not present

## 2018-03-01 DIAGNOSIS — E1152 Type 2 diabetes mellitus with diabetic peripheral angiopathy with gangrene: Secondary | ICD-10-CM | POA: Diagnosis not present

## 2018-03-01 DIAGNOSIS — Z9989 Dependence on other enabling machines and devices: Secondary | ICD-10-CM

## 2018-03-01 DIAGNOSIS — Z66 Do not resuscitate: Secondary | ICD-10-CM | POA: Diagnosis not present

## 2018-03-01 DIAGNOSIS — I739 Peripheral vascular disease, unspecified: Secondary | ICD-10-CM | POA: Diagnosis not present

## 2018-03-01 DIAGNOSIS — N400 Enlarged prostate without lower urinary tract symptoms: Secondary | ICD-10-CM | POA: Diagnosis present

## 2018-03-01 DIAGNOSIS — L02612 Cutaneous abscess of left foot: Secondary | ICD-10-CM | POA: Diagnosis not present

## 2018-03-01 DIAGNOSIS — Z7401 Bed confinement status: Secondary | ICD-10-CM | POA: Diagnosis not present

## 2018-03-01 DIAGNOSIS — Z950 Presence of cardiac pacemaker: Secondary | ICD-10-CM | POA: Diagnosis not present

## 2018-03-01 DIAGNOSIS — M6281 Muscle weakness (generalized): Secondary | ICD-10-CM | POA: Diagnosis not present

## 2018-03-01 DIAGNOSIS — E1151 Type 2 diabetes mellitus with diabetic peripheral angiopathy without gangrene: Secondary | ICD-10-CM | POA: Diagnosis not present

## 2018-03-01 DIAGNOSIS — E782 Mixed hyperlipidemia: Secondary | ICD-10-CM | POA: Diagnosis present

## 2018-03-01 DIAGNOSIS — B952 Enterococcus as the cause of diseases classified elsewhere: Secondary | ICD-10-CM | POA: Diagnosis not present

## 2018-03-01 DIAGNOSIS — E1165 Type 2 diabetes mellitus with hyperglycemia: Secondary | ICD-10-CM | POA: Diagnosis present

## 2018-03-01 DIAGNOSIS — M869 Osteomyelitis, unspecified: Secondary | ICD-10-CM | POA: Diagnosis not present

## 2018-03-01 DIAGNOSIS — L089 Local infection of the skin and subcutaneous tissue, unspecified: Secondary | ICD-10-CM | POA: Diagnosis present

## 2018-03-01 DIAGNOSIS — Z882 Allergy status to sulfonamides status: Secondary | ICD-10-CM

## 2018-03-01 DIAGNOSIS — M79605 Pain in left leg: Secondary | ICD-10-CM

## 2018-03-01 DIAGNOSIS — F341 Dysthymic disorder: Secondary | ICD-10-CM | POA: Diagnosis present

## 2018-03-01 DIAGNOSIS — F1110 Opioid abuse, uncomplicated: Secondary | ICD-10-CM | POA: Diagnosis not present

## 2018-03-01 DIAGNOSIS — L97929 Non-pressure chronic ulcer of unspecified part of left lower leg with unspecified severity: Secondary | ICD-10-CM | POA: Diagnosis not present

## 2018-03-01 DIAGNOSIS — M86172 Other acute osteomyelitis, left ankle and foot: Secondary | ICD-10-CM | POA: Diagnosis not present

## 2018-03-01 DIAGNOSIS — L97529 Non-pressure chronic ulcer of other part of left foot with unspecified severity: Secondary | ICD-10-CM | POA: Diagnosis present

## 2018-03-01 DIAGNOSIS — I2510 Atherosclerotic heart disease of native coronary artery without angina pectoris: Secondary | ICD-10-CM | POA: Diagnosis present

## 2018-03-01 DIAGNOSIS — B966 Bacteroides fragilis [B. fragilis] as the cause of diseases classified elsewhere: Secondary | ICD-10-CM | POA: Diagnosis not present

## 2018-03-01 DIAGNOSIS — G4733 Obstructive sleep apnea (adult) (pediatric): Secondary | ICD-10-CM | POA: Diagnosis not present

## 2018-03-01 DIAGNOSIS — M79672 Pain in left foot: Secondary | ICD-10-CM | POA: Diagnosis not present

## 2018-03-01 DIAGNOSIS — Z7982 Long term (current) use of aspirin: Secondary | ICD-10-CM

## 2018-03-01 DIAGNOSIS — Z89411 Acquired absence of right great toe: Secondary | ICD-10-CM

## 2018-03-01 DIAGNOSIS — I70299 Other atherosclerosis of native arteries of extremities, unspecified extremity: Secondary | ICD-10-CM

## 2018-03-01 DIAGNOSIS — L97519 Non-pressure chronic ulcer of other part of right foot with unspecified severity: Secondary | ICD-10-CM | POA: Diagnosis present

## 2018-03-01 DIAGNOSIS — I252 Old myocardial infarction: Secondary | ICD-10-CM | POA: Diagnosis not present

## 2018-03-01 DIAGNOSIS — F329 Major depressive disorder, single episode, unspecified: Secondary | ICD-10-CM | POA: Diagnosis present

## 2018-03-01 DIAGNOSIS — Z955 Presence of coronary angioplasty implant and graft: Secondary | ICD-10-CM

## 2018-03-01 DIAGNOSIS — M170 Bilateral primary osteoarthritis of knee: Secondary | ICD-10-CM | POA: Diagnosis present

## 2018-03-01 DIAGNOSIS — N200 Calculus of kidney: Secondary | ICD-10-CM | POA: Diagnosis present

## 2018-03-01 DIAGNOSIS — Z8673 Personal history of transient ischemic attack (TIA), and cerebral infarction without residual deficits: Secondary | ICD-10-CM

## 2018-03-01 DIAGNOSIS — G8929 Other chronic pain: Secondary | ICD-10-CM | POA: Diagnosis present

## 2018-03-01 DIAGNOSIS — Z801 Family history of malignant neoplasm of trachea, bronchus and lung: Secondary | ICD-10-CM

## 2018-03-01 DIAGNOSIS — L03116 Cellulitis of left lower limb: Secondary | ICD-10-CM | POA: Diagnosis present

## 2018-03-01 DIAGNOSIS — F3289 Other specified depressive episodes: Secondary | ICD-10-CM | POA: Diagnosis not present

## 2018-03-01 DIAGNOSIS — E785 Hyperlipidemia, unspecified: Secondary | ICD-10-CM | POA: Diagnosis not present

## 2018-03-01 DIAGNOSIS — I10 Essential (primary) hypertension: Secondary | ICD-10-CM | POA: Diagnosis not present

## 2018-03-01 DIAGNOSIS — E1140 Type 2 diabetes mellitus with diabetic neuropathy, unspecified: Secondary | ICD-10-CM | POA: Diagnosis present

## 2018-03-01 DIAGNOSIS — I70202 Unspecified atherosclerosis of native arteries of extremities, left leg: Secondary | ICD-10-CM | POA: Diagnosis present

## 2018-03-01 DIAGNOSIS — Z6841 Body Mass Index (BMI) 40.0 and over, adult: Secondary | ICD-10-CM

## 2018-03-01 DIAGNOSIS — L02416 Cutaneous abscess of left lower limb: Secondary | ICD-10-CM | POA: Diagnosis not present

## 2018-03-01 DIAGNOSIS — L97526 Non-pressure chronic ulcer of other part of left foot with bone involvement without evidence of necrosis: Secondary | ICD-10-CM | POA: Diagnosis not present

## 2018-03-01 DIAGNOSIS — E11621 Type 2 diabetes mellitus with foot ulcer: Principal | ICD-10-CM | POA: Diagnosis present

## 2018-03-01 DIAGNOSIS — Z880 Allergy status to penicillin: Secondary | ICD-10-CM

## 2018-03-01 DIAGNOSIS — I70245 Atherosclerosis of native arteries of left leg with ulceration of other part of foot: Secondary | ICD-10-CM | POA: Diagnosis not present

## 2018-03-01 DIAGNOSIS — Z881 Allergy status to other antibiotic agents status: Secondary | ICD-10-CM

## 2018-03-01 DIAGNOSIS — Z7902 Long term (current) use of antithrombotics/antiplatelets: Secondary | ICD-10-CM

## 2018-03-01 DIAGNOSIS — Z5189 Encounter for other specified aftercare: Secondary | ICD-10-CM | POA: Diagnosis not present

## 2018-03-01 DIAGNOSIS — E11628 Type 2 diabetes mellitus with other skin complications: Secondary | ICD-10-CM | POA: Diagnosis present

## 2018-03-01 DIAGNOSIS — K219 Gastro-esophageal reflux disease without esophagitis: Secondary | ICD-10-CM | POA: Diagnosis present

## 2018-03-01 DIAGNOSIS — M549 Dorsalgia, unspecified: Secondary | ICD-10-CM | POA: Diagnosis not present

## 2018-03-01 DIAGNOSIS — I70249 Atherosclerosis of native arteries of left leg with ulceration of unspecified site: Secondary | ICD-10-CM | POA: Diagnosis not present

## 2018-03-01 DIAGNOSIS — M898X7 Other specified disorders of bone, ankle and foot: Secondary | ICD-10-CM | POA: Diagnosis not present

## 2018-03-01 DIAGNOSIS — Z91048 Other nonmedicinal substance allergy status: Secondary | ICD-10-CM

## 2018-03-01 DIAGNOSIS — Z841 Family history of disorders of kidney and ureter: Secondary | ICD-10-CM

## 2018-03-01 DIAGNOSIS — Z8249 Family history of ischemic heart disease and other diseases of the circulatory system: Secondary | ICD-10-CM

## 2018-03-01 DIAGNOSIS — D62 Acute posthemorrhagic anemia: Secondary | ICD-10-CM | POA: Diagnosis not present

## 2018-03-01 DIAGNOSIS — E119 Type 2 diabetes mellitus without complications: Secondary | ICD-10-CM | POA: Diagnosis not present

## 2018-03-01 LAB — CBC
HEMATOCRIT: 39.1 % (ref 39.0–52.0)
HEMOGLOBIN: 12.3 g/dL — AB (ref 13.0–17.0)
MCH: 25.5 pg — AB (ref 26.0–34.0)
MCHC: 31.5 g/dL (ref 30.0–36.0)
MCV: 81 fL (ref 80.0–100.0)
Platelets: 276 10*3/uL (ref 150–400)
RBC: 4.83 MIL/uL (ref 4.22–5.81)
RDW: 16.2 % — ABNORMAL HIGH (ref 11.5–15.5)
WBC: 9.1 10*3/uL (ref 4.0–10.5)
nRBC: 0 % (ref 0.0–0.2)

## 2018-03-01 LAB — BASIC METABOLIC PANEL
ANION GAP: 10 (ref 5–15)
BUN: 16 mg/dL (ref 8–23)
CHLORIDE: 102 mmol/L (ref 98–111)
CO2: 28 mmol/L (ref 22–32)
Calcium: 9 mg/dL (ref 8.9–10.3)
Creatinine, Ser: 0.91 mg/dL (ref 0.61–1.24)
GFR calc Af Amer: >60 mL/min (ref >60)
GFR calc non Af Amer: >60 mL/min (ref >60)
GLUCOSE: 127 mg/dL — AB (ref 70–99)
POTASSIUM: 4.3 mmol/L (ref 3.5–5.1)
Sodium: 140 mmol/L (ref 135–145)

## 2018-03-01 MED ORDER — ENOXAPARIN SODIUM 40 MG/0.4ML ~~LOC~~ SOLN
40.0000 mg | Freq: Two times a day (BID) | SUBCUTANEOUS | Status: DC
  Administered 2018-03-02 – 2018-03-13 (×21): 40 mg via SUBCUTANEOUS
  Filled 2018-03-01 (×21): qty 0.4

## 2018-03-01 MED ORDER — OXYCODONE HCL ER 10 MG PO T12A
20.0000 mg | EXTENDED_RELEASE_TABLET | Freq: Two times a day (BID) | ORAL | Status: DC
  Administered 2018-03-02 – 2018-03-13 (×23): 20 mg via ORAL
  Filled 2018-03-01 (×23): qty 2

## 2018-03-01 MED ORDER — PANTOPRAZOLE SODIUM 40 MG PO TBEC
40.0000 mg | DELAYED_RELEASE_TABLET | Freq: Every day | ORAL | Status: DC
  Administered 2018-03-02 – 2018-03-13 (×12): 40 mg via ORAL
  Filled 2018-03-01 (×12): qty 1

## 2018-03-01 MED ORDER — CLOPIDOGREL BISULFATE 75 MG PO TABS
75.0000 mg | ORAL_TABLET | Freq: Every day | ORAL | Status: DC
  Administered 2018-03-02 – 2018-03-13 (×10): 75 mg via ORAL
  Filled 2018-03-01 (×10): qty 1

## 2018-03-01 MED ORDER — ONDANSETRON HCL 4 MG PO TABS: 4.0000 mg | ORAL_TABLET | Freq: Four times a day (QID) | ORAL | Status: DC | PRN

## 2018-03-01 MED ORDER — BENAZEPRIL HCL 20 MG PO TABS
20.0000 mg | ORAL_TABLET | Freq: Two times a day (BID) | ORAL | Status: DC
  Administered 2018-03-02 – 2018-03-13 (×23): 20 mg via ORAL
  Filled 2018-03-01 (×25): qty 1

## 2018-03-01 MED ORDER — SODIUM CHLORIDE 0.9 % IV SOLN
1.0000 g | Freq: Three times a day (TID) | INTRAVENOUS | Status: DC
  Administered 2018-03-01 – 2018-03-07 (×18): 1 g via INTRAVENOUS
  Filled 2018-03-01 (×23): qty 1

## 2018-03-01 MED ORDER — SODIUM CHLORIDE 0.9 % IV SOLN
INTRAVENOUS | Status: DC | PRN
  Administered 2018-03-01: 250 mL via INTRAVENOUS

## 2018-03-01 MED ORDER — BUPROPION HCL ER (XL) 150 MG PO TB24
300.0000 mg | ORAL_TABLET | Freq: Every day | ORAL | Status: DC
  Administered 2018-03-02 – 2018-03-13 (×8): 300 mg via ORAL
  Filled 2018-03-01 (×11): qty 2

## 2018-03-01 MED ORDER — VANCOMYCIN HCL IN DEXTROSE 1-5 GM/200ML-% IV SOLN
1000.0000 mg | Freq: Once | INTRAVENOUS | Status: AC
  Administered 2018-03-01: 1000 mg via INTRAVENOUS
  Filled 2018-03-01 (×2): qty 200

## 2018-03-01 MED ORDER — ACETAMINOPHEN 325 MG PO TABS
650.0000 mg | ORAL_TABLET | Freq: Four times a day (QID) | ORAL | Status: DC | PRN
  Administered 2018-03-09 – 2018-03-11 (×2): 650 mg via ORAL
  Filled 2018-03-01 (×3): qty 2

## 2018-03-01 MED ORDER — HYDROCHLOROTHIAZIDE 25 MG PO TABS
12.5000 mg | ORAL_TABLET | Freq: Every day | ORAL | Status: DC
  Administered 2018-03-02 – 2018-03-13 (×11): 12.5 mg via ORAL
  Filled 2018-03-01 (×12): qty 1

## 2018-03-01 MED ORDER — OXCARBAZEPINE 300 MG PO TABS: 300.0000 mg | ORAL_TABLET | Freq: Two times a day (BID) | ORAL | Status: DC

## 2018-03-01 MED ORDER — INSULIN ASPART 100 UNIT/ML ~~LOC~~ SOLN
0.0000 [IU] | Freq: Four times a day (QID) | SUBCUTANEOUS | Status: DC
  Administered 2018-03-02 (×2): 2 [IU] via SUBCUTANEOUS
  Administered 2018-03-02: 5 [IU] via SUBCUTANEOUS
  Administered 2018-03-03 (×2): 3 [IU] via SUBCUTANEOUS
  Administered 2018-03-03: 2 [IU] via SUBCUTANEOUS
  Filled 2018-03-01 (×6): qty 1

## 2018-03-01 MED ORDER — ONDANSETRON HCL 4 MG/2ML IJ SOLN: 4.0000 mg | Freq: Four times a day (QID) | INTRAMUSCULAR | Status: DC | PRN

## 2018-03-01 MED ORDER — FINASTERIDE 5 MG PO TABS
5.0000 mg | ORAL_TABLET | Freq: Every day | ORAL | Status: DC
  Administered 2018-03-02 – 2018-03-13 (×12): 5 mg via ORAL
  Filled 2018-03-01 (×12): qty 1

## 2018-03-01 MED ORDER — ASPIRIN EC 81 MG PO TBEC
81.0000 mg | DELAYED_RELEASE_TABLET | Freq: Every day | ORAL | Status: DC
  Administered 2018-03-02 – 2018-03-13 (×12): 81 mg via ORAL
  Filled 2018-03-01 (×12): qty 1

## 2018-03-01 MED ORDER — PREGABALIN 75 MG PO CAPS
200.0000 mg | ORAL_CAPSULE | Freq: Three times a day (TID) | ORAL | Status: DC
  Administered 2018-03-02 – 2018-03-13 (×32): 200 mg via ORAL
  Filled 2018-03-01 (×33): qty 1

## 2018-03-01 MED ORDER — BUSPIRONE HCL 10 MG PO TABS
10.0000 mg | ORAL_TABLET | Freq: Every day | ORAL | Status: DC
  Administered 2018-03-02 – 2018-03-12 (×10): 10 mg via ORAL
  Filled 2018-03-01 (×12): qty 1

## 2018-03-01 MED ORDER — FENOFIBRATE 54 MG PO TABS
54.0000 mg | ORAL_TABLET | Freq: Every day | ORAL | Status: DC
  Administered 2018-03-02 – 2018-03-12 (×11): 54 mg via ORAL
  Filled 2018-03-01 (×12): qty 1

## 2018-03-01 MED ORDER — VANCOMYCIN HCL 10 G IV SOLR
1250.0000 mg | Freq: Three times a day (TID) | INTRAVENOUS | Status: DC
  Administered 2018-03-02 – 2018-03-09 (×19): 1250 mg via INTRAVENOUS
  Filled 2018-03-01 (×27): qty 1250

## 2018-03-01 MED ORDER — DULOXETINE HCL 60 MG PO CPEP
60.0000 mg | ORAL_CAPSULE | Freq: Every day | ORAL | Status: DC
  Administered 2018-03-02 – 2018-03-13 (×12): 60 mg via ORAL
  Filled 2018-03-01 (×12): qty 1

## 2018-03-01 MED ORDER — DULOXETINE HCL 30 MG PO CPEP: 30.0000 mg | ORAL_CAPSULE | Freq: Two times a day (BID) | ORAL | Status: DC

## 2018-03-01 MED ORDER — OXCARBAZEPINE 300 MG PO TABS
600.0000 mg | ORAL_TABLET | Freq: Every day | ORAL | Status: DC
  Administered 2018-03-02 – 2018-03-12 (×12): 600 mg via ORAL
  Filled 2018-03-01 (×13): qty 2

## 2018-03-01 MED ORDER — OXYCODONE HCL 5 MG PO TABS
5.0000 mg | ORAL_TABLET | ORAL | Status: DC | PRN
  Administered 2018-03-02 – 2018-03-13 (×20): 5 mg via ORAL
  Filled 2018-03-01 (×25): qty 1

## 2018-03-01 MED ORDER — OXCARBAZEPINE 300 MG PO TABS
300.0000 mg | ORAL_TABLET | Freq: Every day | ORAL | Status: DC
  Administered 2018-03-02 – 2018-03-13 (×12): 300 mg via ORAL
  Filled 2018-03-01 (×12): qty 1

## 2018-03-01 MED ORDER — TAMSULOSIN HCL 0.4 MG PO CAPS
0.4000 mg | ORAL_CAPSULE | Freq: Every day | ORAL | Status: DC
  Administered 2018-03-02 – 2018-03-13 (×11): 0.4 mg via ORAL
  Filled 2018-03-01 (×11): qty 1

## 2018-03-01 MED ORDER — ACETAMINOPHEN 650 MG RE SUPP: 650.0000 mg | Freq: Four times a day (QID) | RECTAL | Status: DC | PRN

## 2018-03-01 MED ORDER — DULOXETINE HCL 30 MG PO CPEP
30.0000 mg | ORAL_CAPSULE | Freq: Every day | ORAL | Status: DC
  Administered 2018-03-02 – 2018-03-12 (×12): 30 mg via ORAL
  Filled 2018-03-01 (×13): qty 1

## 2018-03-01 NOTE — Progress Notes (Signed)
Pharmacy Antibiotic Note  Victor Wise is a 67 y.o. male admitted on 03/01/2018 with wound infection.  Pharmacy has been consulted for Vancomycin, Meropenem dosing.  Plan:  Vancomycin 1 gm IV X 1 given in ED on 10/17 @ 18:00. Vancomycin 1250 mg IV Q8H ordered to start 10/18 @ 00:00 , ~ 6 hrs after 1st dose (stacked dosing). This pt will reach Css by 10/19 @ 0600.  Will draw 1st trough on 10/19 @ 0730.   Meropenem 1 gm IV Q8H ordered to start on 10/17 @ 23:00.   Height: 5\' 8"  (172.7 cm) Weight: (!) 310 lb (140.6 kg) IBW/kg (Calculated) : 68.4  Temp (24hrs), Avg:98 F (36.7 C), Min:97.6 F (36.4 C), Max:98.7 F (37.1 C)  Recent Labs  Lab 03/01/18 1514  WBC 9.1  CREATININE 0.91    Estimated Creatinine Clearance: 109.9 mL/min (by C-G formula based on SCr of 0.91 mg/dL).    Allergies  Allergen Reactions  . Amoxicillin Shortness Of Breath, Itching and Other (See Comments)    Has patient had a PCN reaction causing immediate rash, facial/tongue/throat swelling, SOB or lightheadedness with hypotension: Yes Has patient had a PCN reaction causing severe rash involving mucus membranes or skin necrosis: No Has patient had a PCN reaction that required hospitalization No Has patient had a PCN reaction occurring within the last 10 years: No If all of the above answers are "NO", then may proceed with Cephalosporin use.  . Other Anaphylaxis, Itching and Other (See Comments)    Pt states that he is allergic to Endopa.  Reaction:  Anaphylaxis  Pt states that he is allergic to Metabisulfites Reaction:  Itching   . Penicillins Shortness Of Breath, Itching and Other (See Comments)    Has patient had a PCN reaction causing immediate rash, facial/tongue/throat swelling, SOB or lightheadedness with hypotension: Yes Has patient had a PCN reaction causing severe rash involving mucus membranes or skin necrosis: No Has patient had a PCN reaction that required hospitalization No Has patient had a PCN  reaction occurring within the last 10 years: No If all of the above answers are "NO", then may proceed with Cephalosporin use.  [name] Hydralazine Other (See Comments)    Reaction:  Cramping of extremities  . Crestor [Rosuvastatin Calcium] Other (See Comments)    Reaction:  Pt is unable to move arms/legs   . Metoprolol Nausea And Vomiting  . Red Dye Itching  . Statins Other (See Comments)    Reaction:  Pt is unable to move arms/legs  . Sulfa Antibiotics Itching    Other reaction(s): Unknown  . Yellow Dyes (Non-Tartrazine) Itching  . Aspirin Itching and Other (See Comments)    Pt states that he is able to use in lower doses.    . Tape Rash    Please use "paper" tape only. Please use "paper" tape only.    Antimicrobials this admission:   >>    >>   Dose adjustments this admission:   Microbiology results:  BCx:   UCx:    Sputum:    MRSA PCR:   Thank you for allowing pharmacy to be a part of this patient's care.  Melessia Kaus D 03/01/2018 10:41 PM

## 2018-03-01 NOTE — ED Triage Notes (Signed)
Pt to triage via wheelchair.  Pt has redness of left foot and ulcer to bottom of left foot.  Hx diabetes.   Sx for 2 days   Pt alert  Speech clear.

## 2018-03-01 NOTE — ED Notes (Signed)
Vital signs stable. 

## 2018-03-01 NOTE — H&P (Signed)
[facility] [place] NAME: Victor Wise    MR#:  [id number]  DATE OF BIRTH:  10-Jul-1950  DATE OF ADMISSION:  03/01/2018  PRIMARY CARE PHYSICIAN: Lavera Guise, MD   REQUESTING/REFERRING PHYSICIAN: Kerman Passey, MD  CHIEF COMPLAINT:   Chief Complaint  Patient presents with  . Wound Infection  . Foot Pain    HISTORY OF PRESENT ILLNESS:  Victor Wise  is a 67 y.o. male who presents with chief complaint as above.  Patient presents the ED with complaint of left diabetic foot ulcer.  He states that he has had this wound for the past 3 weeks.  He initially saw Dr. Elvina Mattes and had some debridement done.  The wound had been healing afterwards.  Over the last 24 to 48 hours it began hurting, there was outside scabbing that fell off, and his foot started to turn erythematous.  He came to ED for evaluation today.  Work-up here is consistent with soft tissue infection in that region with extending cellulitis.  Antibiotics were administered and hospitalist called for admission  PAST MEDICAL HISTORY:   Past Medical History:  Diagnosis Date  . Allergy   . Basal cell carcinoma    forehead  . BPH (benign prostatic hyperplasia)   . Chronic back pain   . Depression   . Diabetes ([place])   . GERD (gastroesophageal reflux disease)   . Heart attack ([place])   . Hypertension   . Morbid obesity ([place])   . Obstructive sleep apnea   . 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 Status post insertion of spinal cord stimulator   . Stroke ([place])   . UTI (lower urinary tract infection)      PAST SURGICAL HISTORY:   Past Surgical History:  Procedure Laterality Date  . CARDIAC CATHETERIZATION N/A 12/19/2014   Procedure: Coronary Stent Intervention;  Surgeon: Charolette Forward, MD;  Location: [facility];  Service: Cardiovascular;  Laterality: N/A;  . CARDIAC CATHETERIZATION Left 12/19/2014   Procedure: Left Heart Cath and Coronary Angiography;  Surgeon: Dionisio Tameron,  MD;  Location: [facility];  Service: Cardiovascular;  Laterality: Left;  . PACEMAKER INSERTION Left 11/02/2015   Procedure: INSERTION PACEMAKER;  Surgeon: Isaias Cowman, MD;  Location: [facility];  Service: Cardiovascular;  Laterality: Left;  . Pain Stimulator    . Right toe amputation       SOCIAL HISTORY:   Social History   Tobacco Use  . Smoking status: Former Smoker    Types: Cigarettes  . Smokeless tobacco: Never Used  . Tobacco comment: quit 45 years  Substance Use Topics  . Alcohol use: Yes    Alcohol/week: 0.0 standard drinks    Comment: occasionally     FAMILY HISTORY:   Family History  Problem Relation Age of Onset  . Breast cancer Mother   . Cancer Mother   . Hypertension Mother   . Lung cancer Father   . Hypertension Father   . Heart disease Father   . Cancer Father   . Kidney disease Sister   . Prostate cancer Neg Hx      DRUG ALLERGIES:   Allergies  Allergen Reactions  . Amoxicillin Shortness Of Breath, Itching and Other (See Comments)    Has patient had a PCN reaction causing immediate rash, facial/tongue/throat swelling, SOB or lightheadedness with hypotension: Yes Has patient had a PCN reaction causing severe rash involving mucus membranes or skin necrosis: No Has  patient had a PCN reaction that required hospitalization No Has patient had a PCN reaction occurring within the last 10 years: No If all of the above answers are "NO", then may proceed with Cephalosporin use.  . Other Anaphylaxis, Itching and Other (See Comments)    Pt states that he is allergic to Endopa.  Reaction:  Anaphylaxis  Pt states that he is allergic to Metabisulfites Reaction:  Itching   . Penicillins Shortness Of Breath, Itching and Other (See Comments)    Has patient had a PCN reaction causing immediate rash, facial/tongue/throat swelling, SOB or lightheadedness with hypotension: Yes Has patient had a PCN reaction causing severe rash involving mucus membranes  or skin necrosis: No Has patient had a PCN reaction that required hospitalization No Has patient had a PCN reaction occurring within the last 10 years: No If all of the above answers are "NO", then may proceed with Cephalosporin use.  [name] Hydralazine Other (See Comments)    Reaction:  Cramping of extremities  . Crestor [Rosuvastatin Calcium] Other (See Comments)    Reaction:  Pt is unable to move arms/legs   . Metoprolol Nausea And Vomiting  . Red Dye Itching  . Statins Other (See Comments)    Reaction:  Pt is unable to move arms/legs  . Sulfa Antibiotics Itching    Other reaction(s): Unknown  . Yellow Dyes (Non-Tartrazine) Itching  . Aspirin Itching and Other (See Comments)    Pt states that he is able to use in lower doses.    . Tape Rash    Please use "paper" tape only. Please use "paper" tape only.    MEDICATIONS AT HOME:   Prior to Admission medications   Medication Sig Start Date End Date Taking? Authorizing Provider  aspirin EC 81 MG tablet Take 81 mg by mouth daily.    [provider]  benazepril (LOTENSIN) 20 MG tablet TAKE 1 TABLET BY MOUTH TWICE DAILY 03/01/18   Ronnell Freshwater, NP  buPROPion (WELLBUTRIN XL) 300 MG 24 hr tablet Take 300 mg by mouth daily.     [provider]  busPIRone (BUSPAR) 10 MG tablet Take 10 mg by mouth at bedtime.    [provider]  clopidogrel (PLAVIX) 75 MG tablet Take 1 tablet (75 mg total) by mouth daily. 07/17/17   Ronnell Freshwater, NP  DULoxetine (CYMBALTA) 30 MG capsule Take 30-60 mg by mouth 2 (two) times daily. Pt takes two capsules in the morning and one at night.    [provider]  Exenatide ER (BYDUREON) 2 MG PEN Inject 2 mg into the skin once a week. 07/17/17   Ronnell Freshwater, NP  fenofibrate 54 MG tablet TAKE 1 TABLET BY MOUTH A DAY AT SUPPERTIME 12/06/17   Ronnell Freshwater, NP  finasteride (PROSCAR) 5 MG tablet Take 1 tablet (5 mg total) by mouth daily. 02/22/16   Gladstone Lighter, MD   fluticasone (FLONASE) 50 MCG/ACT nasal spray Place 2 sprays into both nostrils daily.    [provider]  hydrochlorothiazide (HYDRODIURIL) 12.5 MG tablet Take 1 tablet (12.5 mg total) by mouth daily. 09/04/17   Ronnell Freshwater, NP  insulin glargine (LANTUS) 100 UNIT/ML injection Inject 0.6 mLs (60 Units total) into the skin daily. 09/05/17   Ronnell Freshwater, NP  Insulin Pen Needle (B-D UF III MINI PEN NEEDLES) 31G X 5 MM MISC Use as directed with insulin e11.65 12/26/17   Ronnell Freshwater, NP  insulin regular (NOVOLIN R) 100  units/mL injection Patient to use Novolin R [place] TiD prior to meals per sliding scale instructions. Max daily dose is 45 units per day. 07/19/17   Ronnell Freshwater, NP  INSULIN SYRINGE 1CC/29G 29G X 1/2" 1 ML MISC Insulin injections QID, use with lantus and regular insulin. DX E11.65 12/28/17   Ronnell Freshwater, NP  Insulin Syringe-Needle U-100 (BD INSULIN SYRINGE U/F 1/2UNIT) 31G X 5/16" 0.3 ML MISC Indulin injection QID. Use with lantus and regular insulin Dx. 11.65 01/08/18   Lavera Guise, MD  NARCAN 4 MG/0.1ML LIQD Take 4 mg by mouth as needed (for opioid overdose).     [provider]  nitroGLYCERIN (NITROSTAT) 0.4 MG SL tablet Place 1 tablet (0.4 mg total) under the tongue every 5 (five) minutes x 3 doses as needed for chest pain. 12/21/14   Dixie Dials, MD  omega-3 acid ethyl esters (LOVAZA) 1 g capsule Take 1 capsule (1 g total) by mouth 2 (two) times daily. 12/28/17   Ronnell Freshwater, NP  Oxcarbazepine (TRILEPTAL) 300 MG tablet Take 300-600 mg by mouth 2 (two) times daily. Pt takes one tablet in the morning and two at night.    [provider]  oxyCODONE (OXY IR/ROXICODONE) 5 MG immediate release tablet Take 1 tablet (5 mg total) by mouth 3 (three) times daily as needed for severe pain. 11/03/15   Dustin Flock, MD  oxyCODONE (OXYCONTIN) 20 mg 12 hr tablet Take 1 tablet (20 mg total) by mouth every 12 (twelve) hours. 11/03/15   Dustin Flock,  MD  pantoprazole (PROTONIX) 40 MG tablet TAKE 1 TABLET BY MOUTH A DAY FOR REFLUX 12/26/17   Ronnell Freshwater, NP  polyethylene glycol (MIRALAX / GLYCOLAX) packet Take 17 g by mouth daily as needed.    [provider]  pregabalin (LYRICA) 200 MG capsule Take 200 mg by mouth 3 (three) times daily. Pt takes two capsules in the morning and one at night.    [provider]  senna (SENOKOT) 8.6 MG TABS tablet Take 1 tablet (8.6 mg total) by mouth 2 (two) times daily. 02/21/16   Gladstone Lighter, MD  tamsulosin (FLOMAX) 0.4 MG CAPS capsule Take 1 capsule (0.4 mg total) by mouth daily after supper. 12/27/17   Ronnell Freshwater, NP    REVIEW OF SYSTEMS:  Review of Systems  Constitutional: Negative for chills, fever, malaise/fatigue and weight loss.  HENT: Negative for ear pain, hearing loss and tinnitus.   Eyes: Negative for blurred vision, double vision, pain and redness.  Respiratory: Negative for cough, hemoptysis and shortness of breath.   Cardiovascular: Negative for chest pain, palpitations, orthopnea and leg swelling.  Gastrointestinal: Negative for abdominal pain, constipation, diarrhea, nausea and vomiting.  Genitourinary: Negative for dysuria, frequency and hematuria.  Musculoskeletal: Negative for back pain, joint pain and neck pain.  Skin:       Left foot ulcerated diabetic wound with some drainage, surrounding erythema and tenderness  Neurological: Negative for dizziness, tremors, focal weakness and weakness.  Endo/Heme/Allergies: Negative for polydipsia. Does not bruise/bleed easily.  Psychiatric/Behavioral: Negative for depression. The patient is not nervous/anxious and does not have insomnia.      VITAL SIGNS:   Vitals:   03/01/18 1636 03/01/18 1645 03/01/18 1700 03/01/18 1848  BP: (!) 183/101 (!) 181/89 (!) 168/92 (!) 180/83  Pulse: 65 (!) 59 (!) 59 72  Resp: (!) 22   20  Temp: 97.6 F (36.4 C)     TempSrc: Oral  SpO2: 99% 100% 100% 100%  Weight:       Height:       Wt Readings from Last 3 Encounters:  03/01/18 (!) 140.6 kg  12/05/17 (!) 141.8 kg  11/23/17 (!) 139.6 kg    PHYSICAL EXAMINATION:  Physical Exam  Vitals reviewed. Constitutional: He is oriented to person, place, and time. He appears well-developed and well-nourished. No distress.  HENT:  Head: Normocephalic and atraumatic.  Mouth/Throat: Oropharynx is clear and moist.  Eyes: Pupils are equal, round, and reactive to light. Conjunctivae and EOM are normal. No scleral icterus.  Neck: Normal range of motion. Neck supple. No JVD present. No thyromegaly present.  Cardiovascular: Normal rate, regular rhythm and intact distal pulses. Exam reveals no gallop and no friction rub.  No murmur heard. Respiratory: Effort normal and breath sounds normal. No respiratory distress. He has no wheezes. He has no rales.  GI: Soft. Bowel sounds are normal. He exhibits no distension. There is no tenderness.  Musculoskeletal: Normal range of motion. He exhibits no edema.  No arthritis, no gout  Lymphadenopathy:    He has no cervical adenopathy.  Neurological: He is alert and oriented to person, place, and time. No cranial nerve deficit.  No dysarthria, no aphasia  Skin: Skin is warm and dry. No rash noted. There is erythema (Left foot ulcerated diabetic wound with some drainage, surrounding erythema and tenderness).  Psychiatric: He has a normal mood and affect. His behavior is normal. Judgment and thought content normal.    LABORATORY PANEL:   CBC Recent Labs  Lab 03/01/18 1514  WBC 9.1  HGB 12.3*  HCT 39.1  PLT 276   ------------------------------------------------------------------------------------------------------------------  Chemistries  Recent Labs  Lab 03/01/18 1514  NA 140  K 4.3  CL 102  CO2 28  GLUCOSE 127*  BUN 16  CREATININE 0.91  CALCIUM 9.0    ------------------------------------------------------------------------------------------------------------------  Cardiac Enzymes No results for input(s): TROPONINI in the last 168 hours. ------------------------------------------------------------------------------------------------------------------  RADIOLOGY:  Dg Foot Complete Left  Result Date: 03/01/2018 CLINICAL DATA:  Diabetic.  Ulceration.  Foot infection. EXAM: LEFT FOOT - COMPLETE 3+ VIEW COMPARISON:  None. FINDINGS: Pronounced soft tissue swelling of the forefoot. Soft tissue ulceration noted at the ball of the foot in the region of the great toe. Air/gas in the soft tissues. No sign of air/gas in the joint. No plain radiographic finding of osteomyelitis. Some degree of hallux valgus deformity. IMPRESSION: Forefoot soft tissue swelling. Ulceration at the ball of the foot underlying the MTP joint of the great toe. Air/gas in the soft tissues. No plain radiographic finding of osteomyelitis or septic arthritis. Electronically Signed   By: Nelson Chimes M.D.   On: 03/01/2018 17:28    EKG:   Orders placed or performed during the hospital encounter of 10/26/15  . EKG 12-Lead  . EKG 12-Lead  . EKG 12-Lead in am (before 8am)  . EKG 12-Lead in am (before 8am)  . EKG    IMPRESSION AND PLAN:  Principal Problem:   Diabetic foot infection ([place]) -IV vancomycin given in the ED, I will add IV meropenem for additional coverage given that he is diabetic.  Patient does not meet sepsis criteria Active Problems:   Uncontrolled type 2 diabetes mellitus with hyperglycemia (HCC) -sliding scale insulin with corresponding glucose checks   Hypertension -continue home meds   Mixed hyperlipidemia -home dose antilipid   OSA on CPAP -CPAP nightly  Chart review performed and case discussed with ED provider. Labs, imaging  and/or ECG reviewed by provider and discussed with patient/family. Management plans discussed with the patient and/or  family.  DVT PROPHYLAXIS: SubQ lovenox   GI PROPHYLAXIS:  None  ADMISSION STATUS: Inpatient     CODE STATUS: Full Code Status History    Date Active Date Inactive Code Status Order ID Comments User Context   02/18/2016 1628 02/21/2016 1556 Full Code [id number]  Theodoro Grist, MD ED   10/26/2015 2111 11/03/2015 1616 Full Code [id number]  Vaughan Basta, MD Inpatient   10/19/2015 2200 10/21/2015 2351 Full Code [id number]  Gladstone Lighter, MD Inpatient   12/19/2014 2131 12/21/2014 1710 Full Code [id number]  Charolette Forward, MD Inpatient   12/19/2014 2123 12/19/2014 2131 Full Code [id number]  Charolette Forward, MD Inpatient   12/19/2014 1238 12/19/2014 1718 Full Code [id number]  Dionisio Nolin, MD Inpatient   12/18/2014 1836 12/19/2014 1238 Full Code [id number]  Henreitta Leber, MD Inpatient    Advance Directive Documentation     Most Recent Value  Type of Advance Directive  Living will  Pre-existing out of facility DNR order (yellow form or pink MOST form)  -  "MOST" Form in Place?  -      TOTAL TIME TAKING CARE OF THIS PATIENT: 45 minutes.   Jannifer Franklin, [place] 03/01/2018, 8:42 PM  [facility]  Office  [phone]  CC: Primary care physician; Lavera Guise, MD  Note:  This document was prepared using Dragon voice recognition software and may include unintentional dictation errors.

## 2018-03-01 NOTE — ED Provider Notes (Signed)
[facility] Emergency Department Provider Note  Time seen: 5:00 PM  I have reviewed the triage vital signs and the nursing notes.   HISTORY  Chief Complaint Wound Infection and Foot Pain    HPI Victor Wise is a 67 y.o. male with a past medical history of depression, diabetes, hypertension, obesity, CVA, presents to the emergency department for left foot infection.  According to the patient he has had a chronic ulceration to the bottom of his left foot.  States over the past 2 to 3 days the ulceration has become painful and his foot has turned red and swollen.  States there is also a foul odor.  Denies any known fever.  States he was vomiting with diarrhea last week but denies any this week.  Patient has a history of a right great toe amputation 3 years ago after an infection, he was concerned so he came to the emergency department for evaluation.   Past Medical History:  Diagnosis Date  . Allergy   . Basal cell carcinoma    forehead  . BPH (benign prostatic hyperplasia)   . Chronic back pain   . Depression   . Diabetes ([place])   . GERD (gastroesophageal reflux disease)   . Heart attack ([place])   . Hypertension   . Morbid obesity ([place])   . Obstructive sleep apnea   . 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 Status post insertion of spinal cord stimulator   . Stroke ([place])   . UTI (lower urinary tract infection)     Patient Active Problem List   Diagnosis Date Noted  . Hypertension 09/05/2017  . OSA on CPAP 09/05/2017  . Uncontrolled type 2 diabetes mellitus with hyperglycemia ([place]) 08/02/2017  . Peripheral vascular disease ([place]) 08/02/2017  . Mixed hyperlipidemia 08/02/2017  . Dysuria 08/02/2017  . Acute renal failure (ARF) ([place]) 02/18/2016  . Hypotension 02/18/2016  . Sick sinus syndrome ([place]) 10/28/2015  . Syncopal episodes 10/26/2015  . Syncope 10/26/2015  . Adjustment disorder with mixed anxiety and depressed mood 10/20/2015  . Dysthymia 10/20/2015  .  CVA (cerebral infarction) 10/19/2015  . Urinary retention 03/25/2015  . 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 Acute MI, anterolateral wall, subsequent episode of care ([place]) 12/19/2014  . SIRS (systemic inflammatory response syndrome) ([place]) 12/18/2014    Past Surgical History:  Procedure Laterality Date  . CARDIAC CATHETERIZATION N/A 12/19/2014   Procedure: Coronary Stent Intervention;  Surgeon: Charolette Forward, MD;  Location: [facility];  Service: Cardiovascular;  Laterality: N/A;  . CARDIAC CATHETERIZATION Left 12/19/2014   Procedure: Left Heart Cath and Coronary Angiography;  Surgeon: Dionisio Radley, MD;  Location: [facility];  Service: Cardiovascular;  Laterality: Left;  . PACEMAKER INSERTION Left 11/02/2015   Procedure: INSERTION PACEMAKER;  Surgeon: Isaias Cowman, MD;  Location: [facility];  Service: Cardiovascular;  Laterality: Left;  . Pain Stimulator    . Right toe amputation      Prior to Admission medications   Medication Sig Start Date End Date Taking? Authorizing Provider  aspirin EC 81 MG tablet Take 81 mg by mouth daily.    [provider]  benazepril (LOTENSIN) 20 MG tablet TAKE 1 TABLET BY MOUTH TWICE DAILY 03/01/18   Ronnell Freshwater, NP  buPROPion (WELLBUTRIN XL) 300 MG 24 hr tablet Take 300 mg by mouth daily.     [provider]  busPIRone (BUSPAR) 10 MG tablet Take 10 mg by mouth at bedtime.  [provider]  clopidogrel (PLAVIX) 75 MG tablet Take 1 tablet (75 mg total) by mouth daily. 07/17/17   Ronnell Freshwater, NP  DULoxetine (CYMBALTA) 30 MG capsule Take 30-60 mg by mouth 2 (two) times daily. Pt takes two capsules in the morning and one at night.    [provider]  Exenatide ER (BYDUREON) 2 MG PEN Inject 2 mg into the skin once a week. 07/17/17   Ronnell Freshwater, NP  fenofibrate 54 MG tablet TAKE 1 TABLET BY MOUTH A DAY AT SUPPERTIME 12/06/17   Ronnell Freshwater, NP  finasteride (PROSCAR) 5 MG tablet Take 1 tablet  (5 mg total) by mouth daily. 02/22/16   Gladstone Lighter, MD  fluticasone (FLONASE) 50 MCG/ACT nasal spray Place 2 sprays into both nostrils daily.    [provider]  hydrochlorothiazide (HYDRODIURIL) 12.5 MG tablet Take 1 tablet (12.5 mg total) by mouth daily. 09/04/17   Ronnell Freshwater, NP  insulin glargine (LANTUS) 100 UNIT/ML injection Inject 0.6 mLs (60 Units total) into the skin daily. 09/05/17   Ronnell Freshwater, NP  Insulin Pen Needle (B-D UF III MINI PEN NEEDLES) 31G X 5 MM MISC Use as directed with insulin e11.65 12/26/17   Ronnell Freshwater, NP  insulin regular (NOVOLIN R) 100 units/mL injection Patient to use Novolin R [place] TiD prior to meals per sliding scale instructions. Max daily dose is 45 units per day. 07/19/17   Ronnell Freshwater, NP  INSULIN SYRINGE 1CC/29G 29G X 1/2" 1 ML MISC Insulin injections QID, use with lantus and regular insulin. DX E11.65 12/28/17   Ronnell Freshwater, NP  Insulin Syringe-Needle U-100 (BD INSULIN SYRINGE U/F 1/2UNIT) 31G X 5/16" 0.3 ML MISC Indulin injection QID. Use with lantus and regular insulin Dx. 11.65 01/08/18   Lavera Guise, MD  NARCAN 4 MG/0.1ML LIQD Take 4 mg by mouth as needed (for opioid overdose).     [provider]  nitroGLYCERIN (NITROSTAT) 0.4 MG SL tablet Place 1 tablet (0.4 mg total) under the tongue every 5 (five) minutes x 3 doses as needed for chest pain. 12/21/14   Dixie Dials, MD  omega-3 acid ethyl esters (LOVAZA) 1 g capsule Take 1 capsule (1 g total) by mouth 2 (two) times daily. 12/28/17   Ronnell Freshwater, NP  Oxcarbazepine (TRILEPTAL) 300 MG tablet Take 300-600 mg by mouth 2 (two) times daily. Pt takes one tablet in the morning and two at night.    [provider]  oxyCODONE (OXY IR/ROXICODONE) 5 MG immediate release tablet Take 1 tablet (5 mg total) by mouth 3 (three) times daily as needed for severe pain. 11/03/15   Dustin Flock, MD  oxyCODONE (OXYCONTIN) 20 mg 12 hr tablet Take 1 tablet (20 mg total)  by mouth every 12 (twelve) hours. 11/03/15   Dustin Flock, MD  pantoprazole (PROTONIX) 40 MG tablet TAKE 1 TABLET BY MOUTH A DAY FOR REFLUX 12/26/17   Ronnell Freshwater, NP  polyethylene glycol (MIRALAX / GLYCOLAX) packet Take 17 g by mouth daily as needed.    [provider]  pregabalin (LYRICA) 200 MG capsule Take 200 mg by mouth 3 (three) times daily. Pt takes two capsules in the morning and one at night.    [provider]  senna (SENOKOT) 8.6 MG TABS tablet Take 1 tablet (8.6 mg total) by mouth 2 (two) times daily. 02/21/16   Gladstone Lighter, MD  tamsulosin (FLOMAX) 0.4 MG CAPS capsule Take 1 capsule (  0.4 mg total) by mouth daily after supper. 12/27/17   Ronnell Freshwater, NP    Allergies  Allergen Reactions  . Amoxicillin Shortness Of Breath, Itching and Other (See Comments)    Has patient had a PCN reaction causing immediate rash, facial/tongue/throat swelling, SOB or lightheadedness with hypotension: Yes Has patient had a PCN reaction causing severe rash involving mucus membranes or skin necrosis: No Has patient had a PCN reaction that required hospitalization No Has patient had a PCN reaction occurring within the last 10 years: No If all of the above answers are "NO", then may proceed with Cephalosporin use.  . Other Anaphylaxis, Itching and Other (See Comments)    Pt states that he is allergic to Endopa.  Reaction:  Anaphylaxis  Pt states that he is allergic to Metabisulfites Reaction:  Itching   . Penicillins Shortness Of Breath, Itching and Other (See Comments)    Has patient had a PCN reaction causing immediate rash, facial/tongue/throat swelling, SOB or lightheadedness with hypotension: Yes Has patient had a PCN reaction causing severe rash involving mucus membranes or skin necrosis: No Has patient had a PCN reaction that required hospitalization No Has patient had a PCN reaction occurring within the last 10 years: No If all of the above answers are "NO",  then may proceed with Cephalosporin use.  [name] Hydralazine Other (See Comments)    Reaction:  Cramping of extremities  . Crestor [Rosuvastatin Calcium] Other (See Comments)    Reaction:  Pt is unable to move arms/legs   . Metoprolol Nausea And Vomiting  . Red Dye Itching  . Statins Other (See Comments)    Reaction:  Pt is unable to move arms/legs  . Sulfa Antibiotics Itching    Other reaction(s): Unknown  . Yellow Dyes (Non-Tartrazine) Itching  . Aspirin Itching and Other (See Comments)    Pt states that he is able to use in lower doses.    . Tape Rash    Please use "paper" tape only. Please use "paper" tape only.    Family History  Problem Relation Age of Onset  . Breast cancer Mother   . Cancer Mother   . Hypertension Mother   . Lung cancer Father   . Hypertension Father   . Heart disease Father   . Cancer Father   . Kidney disease Sister   . Prostate cancer Neg Hx     Social History Social History   Tobacco Use  . Smoking status: Former Smoker    Types: Cigarettes  . Smokeless tobacco: Never Used  . Tobacco comment: quit 45 years  Substance Use Topics  . Alcohol use: Yes    Alcohol/week: 0.0 standard drinks    Comment: occasionally  . Drug use: No    Review of Systems Constitutional: Negative for fever. Cardiovascular: Negative for chest pain. Respiratory: Negative for shortness of breath. Gastrointestinal: Negative for abdominal pain area nausea vomiting diarrhea last week but has since resolved Genitourinary: Negative for urinary compaints Musculoskeletal: Left foot pain redness and ulceration. Skin: Ulceration to the plantar aspect of left foot with redness and swelling encompassing nearly the whole foot more so distally. Neurological: Negative for headache All other ROS negative  ____________________________________________   PHYSICAL EXAM:  VITAL SIGNS: ED Triage Vitals  Enc Vitals Group     BP 03/01/18 1510 (!) 153/69     Pulse Rate 03/01/18  1510 61     Resp 03/01/18 1510 20     Temp 03/01/18 1510 97.8 F (  36.6 C)     Temp Source 03/01/18 1510 Oral     SpO2 03/01/18 1510 97 %     Weight 03/01/18 1511 (!) 310 lb (140.6 kg)     Height 03/01/18 1511 5\' 8"  (1.727 m)     Head Circumference --      Peak Flow --      Pain Score 03/01/18 1510 9     Pain Loc --      Pain Edu? --      Excl. in [place]? --    Constitutional: Alert, lying comfortably in bed, no distress. Eyes: Normal exam ENT   Head: Normocephalic and atraumatic.   Mouth/Throat: Mucous membranes are moist. Cardiovascular: Normal rate, regular rhythm. No murmur Respiratory: Normal respiratory effort without tachypnea nor retractions. Breath sounds are clear Gastrointestinal: Soft and nontender. No distention.  Obese. Musculoskeletal: Moderate erythema of the left foot, worse distally.  Ulceration to the plantar aspect left foot approximately 1.5 cm in diameter, fluctuant next to the ulceration into the medial aspect of the foot consistent with possible abscess Neurologic:  Normal speech and language. No gross focal neurologic deficits  Skin:  Skin is warm.  Erythematous with ulceration as described above to left foot.  There is also darkening of the skin to the left first and second toes distally.  Patient states this is due to crawling around on his floor. Psychiatric: Mood and affect are normal.   ____________________________________________     RADIOLOGY  X-ray shows soft tissue swelling as well as air/gas within the soft tissue.  ____________________________________________   INITIAL IMPRESSION / ASSESSMENT AND PLAN / ED COURSE  Pertinent labs & imaging results that were available during my care of the patient were reviewed by me and considered in my medical decision making (see chart for details).  Patient presents the emergency department for left foot pain, swelling and redness.  Exam most consistent with infected ulceration possible abscess within  the left foot.  Will obtain x-ray imaging to rule out osteomyelitis.  Reassuringly patient's lab work including white blood cell count is normal.  We will start on IV vancomycin and continue to closely monitor.  Given the significant swelling redness and discomfort along with foul odor I anticipate likely IV antibiotics and admission to the hospital.  X-ray shows air/gas within the soft tissue.  We discussed with podiatry.  Patient receiving IV vancomycin we will admit to the hospitalist service.  I discussed the patient with Dr. Vickki Muff who agrees to continued IV antibiotics, he will see first thing in the morning, patient to remain n.p.o. after midnight in case he may require a washout in the OR.  ____________________________________________   FINAL CLINICAL IMPRESSION(S) / ED DIAGNOSES  Cellulitis Abscess   Harvest Dark, MD 03/01/18 1932

## 2018-03-01 NOTE — ED Notes (Addendum)
Per Dr. Kerman Passey gave patient diabetic [place] tray with diet gingerale.AS

## 2018-03-02 ENCOUNTER — Inpatient Hospital Stay: Payer: Medicare HMO | Admitting: Certified Registered Nurse Anesthetist

## 2018-03-02 ENCOUNTER — Encounter: Payer: Self-pay | Admitting: *Deleted

## 2018-03-02 ENCOUNTER — Encounter
Admission: EM | Disposition: A | Discharge status: Skilled Nursing Facility | Payer: Self-pay | Source: Home / Self Care | Attending: Internal Medicine

## 2018-03-02 ENCOUNTER — Other Ambulatory Visit: Payer: Self-pay

## 2018-03-02 HISTORY — PX: IRRIGATION AND DEBRIDEMENT FOOT: SHX6602

## 2018-03-02 LAB — BASIC METABOLIC PANEL
Anion gap: 9 (ref 5–15)
BUN: 14 mg/dL (ref 8–23)
CHLORIDE: 102 mmol/L (ref 98–111)
CO2: 28 mmol/L (ref 22–32)
CREATININE: 0.86 mg/dL (ref 0.61–1.24)
Calcium: 8.5 mg/dL — ABNORMAL LOW (ref 8.9–10.3)
GFR calc Af Amer: >60 mL/min (ref >60)
GFR calc non Af Amer: >60 mL/min (ref >60)
GLUCOSE: 158 mg/dL — AB (ref 70–99)
Potassium: 3.7 mmol/L (ref 3.5–5.1)
Sodium: 139 mmol/L (ref 135–145)

## 2018-03-02 LAB — CBC
HEMATOCRIT: 33.8 % — AB (ref 39.0–52.0)
Hemoglobin: 10.7 g/dL — ABNORMAL LOW (ref 13.0–17.0)
MCH: 25.3 pg — AB (ref 26.0–34.0)
MCHC: 31.7 g/dL (ref 30.0–36.0)
MCV: 79.9 fL — AB (ref 80.0–100.0)
Platelets: 262 10*3/uL (ref 150–400)
RBC: 4.23 MIL/uL (ref 4.22–5.81)
RDW: 16.2 % — AB (ref 11.5–15.5)
WBC: 7.2 10*3/uL (ref 4.0–10.5)
nRBC: 0 % (ref 0.0–0.2)

## 2018-03-02 LAB — GLUCOSE, CAPILLARY
GLUCOSE-CAPILLARY: 165 mg/dL — AB (ref 70–99)
GLUCOSE-CAPILLARY: 211 mg/dL — AB (ref 70–99)
Glucose-Capillary: 137 mg/dL — ABNORMAL HIGH (ref 70–99)
Glucose-Capillary: 163 mg/dL — ABNORMAL HIGH (ref 70–99)
Glucose-Capillary: 195 mg/dL — ABNORMAL HIGH (ref 70–99)
Glucose-Capillary: 275 mg/dL — ABNORMAL HIGH (ref 70–99)

## 2018-03-02 SURGERY — IRRIGATION AND DEBRIDEMENT FOOT
Anesthesia: General | Site: Foot | Laterality: Left

## 2018-03-02 MED ORDER — PHENYLEPHRINE HCL 10 MG/ML IJ SOLN
INTRAMUSCULAR | Status: DC | PRN
  Administered 2018-03-02 (×2): 50 ug via INTRAVENOUS

## 2018-03-02 MED ORDER — PROMETHAZINE HCL 25 MG/ML IJ SOLN: 6.2500 mg | INTRAMUSCULAR | Status: DC | PRN

## 2018-03-02 MED ORDER — LIDOCAINE HCL (PF) 1 % IJ SOLN
INTRAMUSCULAR | Status: AC
  Filled 2018-03-02: qty 30

## 2018-03-02 MED ORDER — SUCCINYLCHOLINE CHLORIDE 20 MG/ML IJ SOLN
INTRAMUSCULAR | Status: AC
  Filled 2018-03-02: qty 1

## 2018-03-02 MED ORDER — LIDOCAINE HCL (PF) 2 % IJ SOLN
INTRAMUSCULAR | Status: AC
  Filled 2018-03-02: qty 10

## 2018-03-02 MED ORDER — PROPOFOL 10 MG/ML IV BOLUS
INTRAVENOUS | Status: AC
  Filled 2018-03-02: qty 20

## 2018-03-02 MED ORDER — PROPOFOL 10 MG/ML IV BOLUS
INTRAVENOUS | Status: DC | PRN
  Administered 2018-03-02: 200 mg via INTRAVENOUS

## 2018-03-02 MED ORDER — CHLORHEXIDINE GLUCONATE 4 % EX LIQD: 60.0000 mL | Freq: Once | CUTANEOUS | Status: DC

## 2018-03-02 MED ORDER — FENTANYL CITRATE (PF) 100 MCG/2ML IJ SOLN: 25.0000 ug | INTRAMUSCULAR | Status: DC | PRN

## 2018-03-02 MED ORDER — LIDOCAINE-EPINEPHRINE 1 %-1:100000 IJ SOLN
INTRAMUSCULAR | Status: AC
  Filled 2018-03-02: qty 1

## 2018-03-02 MED ORDER — INFLUENZA VAC SPLIT HIGH-DOSE 0.5 ML IM SUSY
0.5000 mL | PREFILLED_SYRINGE | INTRAMUSCULAR | Status: DC
  Filled 2018-03-02: qty 0.5

## 2018-03-02 MED ORDER — POVIDONE-IODINE 10 % EX SWAB: 2.0000 "application " | Freq: Once | CUTANEOUS | Status: DC

## 2018-03-02 MED ORDER — LIDOCAINE HCL (CARDIAC) PF 100 MG/5ML IV SOSY
PREFILLED_SYRINGE | INTRAVENOUS | Status: DC | PRN
  Administered 2018-03-02: 100 mg via INTRAVENOUS

## 2018-03-02 MED ORDER — ONDANSETRON HCL 4 MG/2ML IJ SOLN
INTRAMUSCULAR | Status: DC | PRN
  Administered 2018-03-02: 4 mg via INTRAVENOUS

## 2018-03-02 MED ORDER — LIDOCAINE-EPINEPHRINE 1 %-1:100000 IJ SOLN
INTRAMUSCULAR | Status: DC | PRN
  Administered 2018-03-02: 10 mL

## 2018-03-02 MED ORDER — FENTANYL CITRATE (PF) 100 MCG/2ML IJ SOLN
INTRAMUSCULAR | Status: DC | PRN
  Administered 2018-03-02: 100 ug via INTRAVENOUS

## 2018-03-02 MED ORDER — BUPIVACAINE HCL 0.5 % IJ SOLN
INTRAMUSCULAR | Status: DC | PRN
  Administered 2018-03-02: 10 mL

## 2018-03-02 MED ORDER — BUPIVACAINE HCL (PF) 0.5 % IJ SOLN
INTRAMUSCULAR | Status: AC
  Filled 2018-03-02: qty 30

## 2018-03-02 MED ORDER — LABETALOL HCL 5 MG/ML IV SOLN
10.0000 mg | INTRAVENOUS | Status: DC | PRN
  Administered 2018-03-10: 10 mg via INTRAVENOUS
  Filled 2018-03-02 (×2): qty 4

## 2018-03-02 MED ORDER — MEPERIDINE HCL 50 MG/ML IJ SOLN: 6.2500 mg | INTRAMUSCULAR | Status: DC | PRN

## 2018-03-02 MED ORDER — INSULIN GLARGINE 100 UNIT/ML ~~LOC~~ SOLN
30.0000 [IU] | Freq: Every day | SUBCUTANEOUS | Status: DC
  Administered 2018-03-02 – 2018-03-05 (×4): 30 [IU] via SUBCUTANEOUS
  Filled 2018-03-02 (×5): qty 0.3

## 2018-03-02 SURGICAL SUPPLY — 61 items
BANDAGE ACE 4X5 VEL STRL LF (GAUZE/BANDAGES/DRESSINGS) ×2 IMPLANT
BLADE OSC/SAGITTAL MD 5.5X18 (BLADE) IMPLANT
BLADE OSCILLATING/SAGITTAL (BLADE)
BLADE SW THK.38XMED LNG THN (BLADE) IMPLANT
BNDG COHESIVE 4X5 TAN STRL (GAUZE/BANDAGES/DRESSINGS) ×2 IMPLANT
BNDG COHESIVE 6X5 TAN STRL LF (GAUZE/BANDAGES/DRESSINGS) ×2 IMPLANT
BNDG CONFORM 3 STRL LF (GAUZE/BANDAGES/DRESSINGS) ×2 IMPLANT
BNDG ESMARK 4X12 TAN STRL LF (GAUZE/BANDAGES/DRESSINGS) ×2 IMPLANT
BNDG GAUZE 4.5X4.1 6PLY STRL (MISCELLANEOUS) ×2 IMPLANT
CANISTER SUCT 1200ML W/VALVE (MISCELLANEOUS) ×2 IMPLANT
CANISTER SUCT 3000ML PPV (MISCELLANEOUS) ×2 IMPLANT
COVER WAND RF STERILE (DRAPES) ×2 IMPLANT
CUFF TOURN 18 STER (MISCELLANEOUS) ×2 IMPLANT
CUFF TOURN DUAL PL 12 NO SLV (MISCELLANEOUS) IMPLANT
DRAPE FLUOR MINI C-ARM 54X84 (DRAPES) IMPLANT
DRAPE XRAY CASSETTE 23X24 (DRAPES) IMPLANT
DRESSING ALLEVYN 4X4 (MISCELLANEOUS) IMPLANT
DURAPREP 26ML APPLICATOR (WOUND CARE) ×2 IMPLANT
ELECT REM PT RETURN 9FT ADLT (ELECTROSURGICAL) ×2
ELECTRODE REM PT RTRN 9FT ADLT (ELECTROSURGICAL) ×1 IMPLANT
GAUZE PACKING 1/4 X5 YD (GAUZE/BANDAGES/DRESSINGS) ×2 IMPLANT
GAUZE PACKING IODOFORM 1/2 (PACKING) ×2 IMPLANT
GAUZE PACKING IODOFORM 1X5 (MISCELLANEOUS) ×2 IMPLANT
GAUZE PETRO XEROFOAM 1X8 (MISCELLANEOUS) ×2 IMPLANT
GAUZE SPONGE 4X4 12PLY STRL (GAUZE/BANDAGES/DRESSINGS) ×2 IMPLANT
GAUZE STRETCH 2X75IN STRL (MISCELLANEOUS) ×2 IMPLANT
GLOVE BIO SURGEON STRL SZ7.5 (GLOVE) ×2 IMPLANT
GLOVE INDICATOR 8.0 STRL GRN (GLOVE) ×2 IMPLANT
GOWN STRL REUS W/ TWL LRG LVL3 (GOWN DISPOSABLE) ×2 IMPLANT
GOWN STRL REUS W/TWL LRG LVL3 (GOWN DISPOSABLE) ×4
GOWN STRL REUS W/TWL MED LVL3 (GOWN DISPOSABLE) ×4 IMPLANT
HANDPIECE VERSAJET DEBRIDEMENT (MISCELLANEOUS) ×2 IMPLANT
IV NS 1000ML (IV SOLUTION) ×1
IV NS 1000ML BAXH (IV SOLUTION) ×1 IMPLANT
KIT TURNOVER KIT A (KITS) ×2 IMPLANT
LABEL OR SOLS (LABEL) IMPLANT
NEEDLE FILTER BLUNT 18X 1/2SAF (NEEDLE) ×1
NEEDLE FILTER BLUNT 18X1 1/2 (NEEDLE) ×1 IMPLANT
NEEDLE HYPO 25X1 1.5 SAFETY (NEEDLE) ×2 IMPLANT
NS IRRIG 500ML POUR BTL (IV SOLUTION) ×2 IMPLANT
PACK EXTREMITY ARMC (MISCELLANEOUS) ×2 IMPLANT
PAD ABD DERMACEA PRESS 5X9 (GAUZE/BANDAGES/DRESSINGS) ×4 IMPLANT
PULSAVAC PLUS IRRIG FAN TIP (DISPOSABLE) ×2
RASP SM TEAR CROSS CUT (RASP) IMPLANT
SHIELD FULL FACE ANTIFOG 7M (MISCELLANEOUS) ×2 IMPLANT
SOL .9 NS 3000ML IRR  AL (IV SOLUTION) ×1
SOL .9 NS 3000ML IRR UROMATIC (IV SOLUTION) ×1 IMPLANT
SOL PREP PVP 2OZ (MISCELLANEOUS) ×2
SOLUTION PREP PVP 2OZ (MISCELLANEOUS) ×1 IMPLANT
STOCKINETTE IMPERVIOUS 9X36 MD (GAUZE/BANDAGES/DRESSINGS) ×2 IMPLANT
SUT ETHILON 2 0 FS 18 (SUTURE) IMPLANT
SUT ETHILON 4-0 (SUTURE) ×2
SUT ETHILON 4-0 FS2 18XMFL BLK (SUTURE) ×1
SUT VIC AB 3-0 SH 27 (SUTURE) ×1
SUT VIC AB 3-0 SH 27X BRD (SUTURE) ×1 IMPLANT
SUT VIC AB 4-0 FS2 27 (SUTURE) IMPLANT
SUTURE ETHLN 4-0 FS2 18XMF BLK (SUTURE) ×1 IMPLANT
SWAB CULTURE AMIES ANAERIB BLU (MISCELLANEOUS) ×2 IMPLANT
SYR 10ML LL (SYRINGE) ×4 IMPLANT
SYR 3ML LL SCALE MARK (SYRINGE) ×2 IMPLANT
TIP FAN IRRIG PULSAVAC PLUS (DISPOSABLE) ×1 IMPLANT

## 2018-03-02 NOTE — Progress Notes (Signed)
Patient post op I/D of Left foot. No complaints of pain, VSS. Resume diet. Cbg/s, dressing to foot dry and intact.

## 2018-03-02 NOTE — Progress Notes (Signed)
[place] [facility] [place] NAME: Victor Wise    MR#:  [id number]  DATE OF BIRTH:  12-Dec-1950  SUBJECTIVE:  CHIEF COMPLAINT:   Chief Complaint  Patient presents with  . Wound Infection  . Foot Pain   -Patient with diabetes, came in with left foot infection. -Plan I&D in the OR and deep wound cultures done today.  No complaints.  REVIEW OF SYSTEMS:  Review of Systems  Constitutional: Negative for chills and fever.  HENT: Negative for ear pain, hearing loss and tinnitus.   Eyes: Negative for blurred vision and double vision.  Respiratory: Negative for cough, shortness of breath and wheezing.   Cardiovascular: Positive for leg swelling. Negative for chest pain and palpitations.  Gastrointestinal: Negative for abdominal pain, constipation, diarrhea, nausea and vomiting.  Genitourinary: Negative for dysuria.  Musculoskeletal: Negative for myalgias.  Skin: Positive for rash.  Neurological: Negative for dizziness, focal weakness, seizures, weakness and headaches.  Psychiatric/Behavioral: Negative for depression.    DRUG ALLERGIES:   Allergies  Allergen Reactions  . Amoxicillin Shortness Of Breath, Itching and Other (See Comments)    Has patient had a PCN reaction causing immediate rash, facial/tongue/throat swelling, SOB or lightheadedness with hypotension: Yes Has patient had a PCN reaction causing severe rash involving mucus membranes or skin necrosis: No Has patient had a PCN reaction that required hospitalization No Has patient had a PCN reaction occurring within the last 10 years: No If all of the above answers are "NO", then may proceed with Cephalosporin use.  . Other Anaphylaxis, Itching and Other (See Comments)    Pt states that he is allergic to Endopa.  Reaction:  Anaphylaxis  Pt states that he is allergic to Metabisulfites Reaction:  Itching   . Penicillins Shortness Of Breath, Itching and Other (See Comments)    Has patient had  a PCN reaction causing immediate rash, facial/tongue/throat swelling, SOB or lightheadedness with hypotension: Yes Has patient had a PCN reaction causing severe rash involving mucus membranes or skin necrosis: No Has patient had a PCN reaction that required hospitalization No Has patient had a PCN reaction occurring within the last 10 years: No If all of the above answers are "NO", then may proceed with Cephalosporin use.  [name] Hydralazine Other (See Comments)    Reaction:  Cramping of extremities  . Crestor [Rosuvastatin Calcium] Other (See Comments)    Reaction:  Pt is unable to move arms/legs   . Metoprolol Nausea And Vomiting  . Red Dye Itching  . Statins Other (See Comments)    Reaction:  Pt is unable to move arms/legs  . Sulfa Antibiotics Itching    Other reaction(s): Unknown  . Yellow Dyes (Non-Tartrazine) Itching  . Aspirin Itching and Other (See Comments)    Pt states that he is able to use in lower doses.    . Tape Rash    Please use "paper" tape only. Please use "paper" tape only.    VITALS:  Blood pressure (!) 142/78, pulse 60, temperature 99.2 F (37.3 C), temperature source Axillary, resp. rate 20, height 5\' 8"  (1.727 m), weight (!) 140.6 kg, SpO2 99 %.  PHYSICAL EXAMINATION:  Physical Exam  GENERAL:  67 y.o.-year-old obese patient lying in the bed with no acute distress.  EYES: Pupils equal, round, reactive to light and accommodation. No scleral icterus. Extraocular muscles intact.  HEENT: Head atraumatic, normocephalic. Oropharynx and nasopharynx clear.  NECK:  Supple, no jugular venous distention. No thyroid  enlargement, no tenderness.  LUNGS: Normal breath sounds bilaterally, no wheezing, rales,rhonchi or crepitation. No use of accessory muscles of respiration.  Decreased bibasilar breath sounds CARDIOVASCULAR: S1, S2 normal. No murmurs, rubs, or gallops.  ABDOMEN: Soft, nontender, nondistended. Bowel sounds present. No organomegaly or mass.  EXTREMITIES: No pedal  edema, cyanosis, or clubbing.  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  Left foot dressing in place. NEUROLOGIC: Cranial nerves II through XII are intact. Muscle strength 5/5 in all extremities. Sensation intact. Gait not checked.  PSYCHIATRIC: The patient is alert and oriented x 3.  SKIN: No obvious rash, lesion, or ulcer.    LABORATORY PANEL:   CBC Recent Labs  Lab 03/02/18 0435  WBC 7.2  HGB 10.7*  HCT 33.8*  PLT 262   ------------------------------------------------------------------------------------------------------------------  Chemistries  Recent Labs  Lab 03/02/18 0435  NA 139  K 3.7  CL 102  CO2 28  GLUCOSE 158*  BUN 14  CREATININE 0.86  CALCIUM 8.5*   ------------------------------------------------------------------------------------------------------------------  Cardiac Enzymes No results for input(s): TROPONINI in the last 168 hours. ------------------------------------------------------------------------------------------------------------------  RADIOLOGY:  Dg Foot Complete Left  Result Date: 03/01/2018 CLINICAL DATA:  Diabetic.  Ulceration.  Foot infection. EXAM: LEFT FOOT - COMPLETE 3+ VIEW COMPARISON:  None. FINDINGS: Pronounced soft tissue swelling of the forefoot. Soft tissue ulceration noted at the ball of the foot in the region of the great toe. Air/gas in the soft tissues. No sign of air/gas in the joint. No plain radiographic finding of osteomyelitis. Some degree of hallux valgus deformity. IMPRESSION: Forefoot soft tissue swelling. Ulceration at the ball of the foot underlying the MTP joint of the great toe. Air/gas in the soft tissues. No plain radiographic finding of osteomyelitis or septic arthritis. Electronically Signed   By: Nelson Chimes M.D.   On: 03/01/2018 17:28    EKG:   Orders placed or performed during the hospital encounter of 10/26/15  . EKG 12-Lead  . EKG 12-Lead  . EKG 12-Lead in am (before 8am)  . EKG  12-Lead in am (before 8am)  . EKG    ASSESSMENT AND PLAN:   67 year old male with past medical history significant for diabetes, hypertension, chronic low back pain and CAD resents to hospital secondary to left foot diabetic ulcer  1.  Left foot cellulitis and diabetic foot ulcer-appreciate podiatry consult -On vancomycin and meropenem -X-rays negative for osteomyelitis -Status post I&D in the OR and deep wound culture today.  Dressing changes per podiatry -Follow-up cultures and continue antibiotics at this time -Weightbearing as tolerated -Also vascular surgery consult for possible revascularization  2.  Diabetes mellitus-sliding scale insulin.  Check A1c.  3.  Chronic back pain-patient on Lyrica, Cymbalta, oxycodone and OxyContin  4.  Hypertension-benazepril and hydrochlorothiazide.  5.  DVT prophylaxis-on Lovenox    All the records are reviewed and case discussed with Care Management/Social Workerr. Management plans discussed with the patient, family and they are in agreement.  CODE STATUS: Full code  TOTAL TIME TAKING CARE OF THIS PATIENT: 38 minutes.   POSSIBLE D/C IN 2 DAYS, DEPENDING ON CLINICAL CONDITION.   Gladstone Lighter M.D on 03/02/2018 at 3:21 PM  Between 7am to 6pm - Pager - [phone]  After 6pm go to [email or web address] - password [email or web address]  Office  [phone]  CC: Primary care physician; Lavera Guise, MD

## 2018-03-02 NOTE — Consult Note (Signed)
[name] Vascular Consult Note  MRN : [id number]  Victor Wise is a 67 y.o. (Jan 04, 1951) male who presents with chief complaint of  Chief Complaint  Patient presents with  . Wound Infection  . Foot Pain   History of Present Illness:  The patient is a 67 year old male with a past medical history of morbid obesity, BPH, chronic back pain, depression, diabetes, GERD, status post MI in the past, hypertension, obstructive sleep apnea, status post insertion of a spinal cord stimulator, status post stroke who presented to the [facility] emergency department complaining of worsening left foot pain and nonhealing ulcer.  The patient is known to our service he is status post a right lower extremity angiogram with endovascular intervention and stent placement by Dr. Lucky Cowboy in 2015.  The patient has not followed up in our office since then.  The patient notes that he "falls all the time".  The patient states that the stent in his right lower extremity is "broken" after one of these falls.  The patient notes that at some point in the past (he does not remember when) imaging of his right lower extremity was completed and a "broken stent" was noted.  The patient denies claudication-like symptoms, rest pain or ulcer formation to the bilateral lower extremity.  The patient notes that the ulceration to his left foot formed after a recent fall from bed.  The patient notes that his ulceration was healing well until about a half a week ago when he experienced increasing pain, edema and erythema to the left foot and leg.  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  This amputation site healed well.  The patient denies any fever, nausea vomiting.  Vascular surgery was consulted by Dr. Vickki Muff in regard to the patient's peripheral artery disease.  Current Facility-Administered Medications  Medication Dose Route Frequency  Provider Last Rate Last Dose  . 0.9 %  sodium chloride infusion   Intravenous PRN Samara Deist, DPM 10 mL/hr at 03/01/18 2352 250 mL at 03/01/18 2352  . acetaminophen (TYLENOL) tablet 650 mg  650 mg Oral Q6H PRN Samara Deist, DPM       Or  . acetaminophen (TYLENOL) suppository 650 mg  650 mg Rectal Q6H PRN Samara Deist, DPM      . aspirin EC tablet 81 mg  81 mg Oral Daily Samara Deist, DPM   81 mg at 03/02/18 1337  . benazepril (LOTENSIN) tablet 20 mg  20 mg Oral BID Samara Deist, DPM   20 mg at 03/02/18 0016  . buPROPion (WELLBUTRIN XL) 24 hr tablet 300 mg  300 mg Oral Daily Samara Deist, DPM   300 mg at 03/02/18 1337  . busPIRone (BUSPAR) tablet 10 mg  10 mg Oral QHS Samara Deist, DPM   10 mg at 03/02/18 0012  . clopidogrel (PLAVIX) tablet 75 mg  75 mg Oral Daily Samara Deist, DPM   75 mg at 03/02/18 1337  . DULoxetine (CYMBALTA) DR capsule 30 mg  30 mg Oral QHS Samara Deist, DPM   30 mg at 03/02/18 0016  . DULoxetine (CYMBALTA) DR capsule 60 mg  60 mg Oral Daily Samara Deist, DPM   60 mg at 03/02/18 1336  . enoxaparin (LOVENOX) injection 40 mg  40 mg Subcutaneous BID Samara Deist, DPM      . fenofibrate tablet 54 mg  54 mg Oral Daily Samara Deist, DPM      .  finasteride (PROSCAR) tablet 5 mg  5 mg Oral Daily Samara Deist, DPM   5 mg at 03/02/18 1337  . hydrochlorothiazide (HYDRODIURIL) tablet 12.5 mg  12.5 mg Oral Daily Samara Deist, DPM   12.5 mg at 03/02/18 1338  . [START ON 03/03/2018] Influenza vac split quadrivalent PF (FLUZONE HIGH-DOSE) injection 0.5 mL  0.5 mL Intramuscular Tomorrow-1000 Samara Deist, DPM      . insulin aspart (novoLOG) injection 0-9 Units  0-9 Units Subcutaneous Q6H Samara Deist, DPM   2 Units at 03/02/18 1334  . labetalol (NORMODYNE,TRANDATE) injection 10 mg  10 mg Intravenous Q4H PRN Samara Deist, DPM      . meropenem (MERREM) 1 g in sodium chloride 0.9 % 100 mL IVPB  1 g Intravenous Q8H Samara Deist, DPM 200 mL/hr at 03/02/18 1335  1 g at 03/02/18 1335  . ondansetron (ZOFRAN) tablet 4 mg  4 mg Oral Q6H PRN Samara Deist, DPM       Or  . ondansetron [facility]) injection 4 mg  4 mg Intravenous Q6H PRN Samara Deist, DPM      . Oxcarbazepine (TRILEPTAL) tablet 300 mg  300 mg Oral Daily Samara Deist, DPM   300 mg at 03/02/18 1337  . Oxcarbazepine (TRILEPTAL) tablet 600 mg  600 mg Oral QHS Samara Deist, DPM   600 mg at 03/02/18 0014  . oxyCODONE (Oxy IR/ROXICODONE) immediate release tablet 5 mg  5 mg Oral Q4H PRN Samara Deist, DPM      . oxyCODONE (OXYCONTIN) 12 hr tablet 20 mg  20 mg Oral Q12H Samara Deist, DPM   20 mg at 03/02/18 0012  . pantoprazole (PROTONIX) EC tablet 40 mg  40 mg Oral Daily Samara Deist, DPM   40 mg at 03/02/18 1337  . pregabalin (LYRICA) capsule 200 mg  200 mg Oral TID Samara Deist, DPM   200 mg at 03/02/18 1336  . tamsulosin (FLOMAX) capsule 0.4 mg  0.4 mg Oral QPC supper Samara Deist, DPM      . vancomycin (VANCOCIN) 1,250 mg in sodium chloride 0.9 % 250 mL IVPB  1,250 mg Intravenous Q8H Samara Deist, DPM 166.7 mL/hr at 03/02/18 1036 1,250 mg at 03/02/18 1036   Past Medical History:  Diagnosis Date  . Allergy   . Basal cell carcinoma    forehead  . BPH (benign prostatic hyperplasia)   . Chronic back pain   . Depression   . Diabetes ([place])   . GERD (gastroesophageal reflux disease)   . Heart attack ([place])   . Hypertension   . Morbid obesity ([place])   . Obstructive sleep apnea   . 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 Status post insertion of spinal cord stimulator   . Stroke ([place])   . UTI (lower urinary tract infection)    Past Surgical History:  Procedure Laterality Date  . CARDIAC CATHETERIZATION N/A 12/19/2014   Procedure: Coronary Stent Intervention;  Surgeon: Charolette Forward, MD;  Location: [facility] [address] [facility];  Service: Cardiovascular;  Laterality: N/A;  . CARDIAC CATHETERIZATION Left 12/19/2014   Procedure: Left Heart Cath and Coronary Angiography;  Surgeon: Dionisio Tylerjames, MD;   Location: [facility];  Service: Cardiovascular;  Laterality: Left;  . PACEMAKER INSERTION Left 11/02/2015   Procedure: INSERTION PACEMAKER;  Surgeon: Isaias Cowman, MD;  Location: [facility];  Service: Cardiovascular;  Laterality: Left;  . Pain Stimulator    . Right toe amputation     Social History Social History   Tobacco Use  .  Smoking status: Former Smoker    Types: Cigarettes  . Smokeless tobacco: Never Used  . Tobacco comment: quit 45 years  Substance Use Topics  . Alcohol use: Yes    Alcohol/week: 0.0 standard drinks    Comment: occasionally  . Drug use: No   Family History Family History  Problem Relation Age of Onset  . Breast cancer Mother   . Cancer Mother   . Hypertension Mother   . Lung cancer Father   . Hypertension Father   . Heart disease Father   . Cancer Father   . Kidney disease Sister   . Prostate cancer Neg Hx   The patient denies any family history of arterial disease, venous disease or bleeding/clotting disorders.  Allergies  Allergen Reactions  . Amoxicillin Shortness Of Breath, Itching and Other (See Comments)    Has patient had a PCN reaction causing immediate rash, facial/tongue/throat swelling, SOB or lightheadedness with hypotension: Yes Has patient had a PCN reaction causing severe rash involving mucus membranes or skin necrosis: No Has patient had a PCN reaction that required hospitalization No Has patient had a PCN reaction occurring within the last 10 years: No If all of the above answers are "NO", then may proceed with Cephalosporin use.  . Other Anaphylaxis, Itching and Other (See Comments)    Pt states that he is allergic to Endopa.  Reaction:  Anaphylaxis  Pt states that he is allergic to Metabisulfites Reaction:  Itching   . Penicillins Shortness Of Breath, Itching and Other (See Comments)    Has patient had a PCN reaction causing immediate rash, facial/tongue/throat swelling, SOB or lightheadedness with hypotension:  Yes Has patient had a PCN reaction causing severe rash involving mucus membranes or skin necrosis: No Has patient had a PCN reaction that required hospitalization No Has patient had a PCN reaction occurring within the last 10 years: No If all of the above answers are "NO", then may proceed with Cephalosporin use.  [name] Hydralazine Other (See Comments)    Reaction:  Cramping of extremities  . Crestor [Rosuvastatin Calcium] Other (See Comments)    Reaction:  Pt is unable to move arms/legs   . Metoprolol Nausea And Vomiting  . Red Dye Itching  . Statins Other (See Comments)    Reaction:  Pt is unable to move arms/legs  . Sulfa Antibiotics Itching    Other reaction(s): Unknown  . Yellow Dyes (Non-Tartrazine) Itching  . Aspirin Itching and Other (See Comments)    Pt states that he is able to use in lower doses.    . Tape Rash    Please use "paper" tape only. Please use "paper" tape only.   REVIEW OF SYSTEMS (Negative unless checked)  Constitutional: [] Weight loss  [] Fever  [] Chills Cardiac: [] Chest pain   [] Chest pressure   [] Palpitations   [] Shortness of breath when laying flat   [] Shortness of breath at rest   [] Shortness of breath with exertion. Vascular:  [] Pain in legs with walking   [] Pain in legs at rest   [] Pain in legs when laying flat   [] Claudication   [x] Pain in feet when walking  [x] Pain in feet at rest  [x] Pain in feet when laying flat   [] History of DVT   [] Phlebitis   [x] Swelling in legs   [] Varicose veins   [x] Non-healing ulcers Pulmonary:   [] Uses home oxygen   [] Productive cough   [] Hemoptysis   [] Wheeze  [] COPD   [] Asthma Neurologic:  [] Dizziness  [] Blackouts   [] Seizures   []   History of stroke   [] History of TIA  [] Aphasia   [] Temporary blindness   [] Dysphagia   [] Weakness or numbness in arms   [] Weakness or numbness in legs Musculoskeletal:  [] Arthritis   [] Joint swelling   [] Joint pain   [] Low back pain Hematologic:  [] Easy bruising  [] Easy bleeding   [] Hypercoagulable  state   [] Anemic  [] Hepatitis Gastrointestinal:  [] Blood in stool   [] Vomiting blood  [] Gastroesophageal reflux/heartburn   [] Difficulty swallowing. Genitourinary:  [] Chronic kidney disease   [] Difficult urination  [] Frequent urination  [] Burning with urination   [] Blood in urine Skin:  [] Rashes   [x] Ulcers   [x] Wounds Psychological:  [] History of anxiety   []  History of major depression.  Physical Examination  Vitals:   03/02/18 1212 03/02/18 1227 03/02/18 1255 03/02/18 1355  BP: 119/64 119/76 123/61 (!) 142/78  Pulse: (!) 58 (!) 59 (!) 59 60  Resp: 13 16  20   Temp:  98.3 F (36.8 C) 98 F (36.7 C) 99.2 F (37.3 C)  TempSrc:   Oral Axillary  SpO2: 93% 94% 97% 99%  Weight:      Height:       Body mass index is 47.14 kg/m. Gen:  WD/WN, NAD Head: [place]/AT, No temporalis wasting. Prominent temp pulse not noted. Ear/Nose/Throat: Hearing grossly intact, nares w/o erythema or drainage, oropharynx w/o Erythema/Exudate Eyes: Sclera non-icteric, conjunctiva clear Neck: Trachea midline.  No JVD.  Pulmonary:  Good air movement, respirations not labored, equal bilaterally.  Cardiac: RRR, normal S1, S2. Vascular:  Vessel Right Left  Radial Palpable Palpable  Ulnar Palpable Palpable  Brachial Palpable Palpable  Carotid Palpable, without bruit Palpable, without bruit  Aorta Not palpable N/A  Femoral Palpable Palpable  Popliteal Palpable Palpable  PT Non-Palpable Non-Palpable  DP Non-Palpable Non-Palpable   Right Lower Extremity: Thigh is soft, calf is soft.  Extremity is warm.  Unable to palpate pedal pulses.  Mild nonpitting edema noted.  No erythema or cellulitis noted.  Second toe abrasion noted - this area is dry without any drainage.  Left Lower Extremity: Thigh is soft, calf is soft.  Extremity is warm.  The foot is wrapped in an OR dressing placed this AM.  Toes are warm.  Unable to palpate any pedal pulses.  Moderate edema.  Gastrointestinal: soft, non-tender/non-distended. No  guarding/reflex.  Musculoskeletal: M/S 5/5 throughout.  Neurologic: Sensation grossly intact in extremities.  Symmetrical.  Speech is fluent. Motor exam as listed above. Psychiatric: Judgment intact, Mood & affect appropriate for pt's clinical situation. Dermatologic: As above. Lymph : No Cervical, Axillary, or Inguinal lymphadenopathy.  CBC Lab Results  Component Value Date   WBC 7.2 03/02/2018   HGB 10.7 (L) 03/02/2018   HCT 33.8 (L) 03/02/2018   MCV 79.9 (L) 03/02/2018   PLT 262 03/02/2018   BMET    Component Value Date/Time   NA 139 03/02/2018 0435   NA 138 02/06/2014 1628   K 3.7 03/02/2018 0435   K 4.6 02/06/2014 1628   CL 102 03/02/2018 0435   CL 102 02/06/2014 1628   CO2 28 03/02/2018 0435   CO2 30 02/06/2014 1628   GLUCOSE 158 (H) 03/02/2018 0435   GLUCOSE 139 (H) 02/06/2014 1628   BUN 14 03/02/2018 0435   BUN 31 (H) 02/06/2014 1628   CREATININE 0.86 03/02/2018 0435   CREATININE 1.16 02/06/2014 1628   CALCIUM 8.5 (L) 03/02/2018 0435   CALCIUM 8.6 02/06/2014 1628   GFRNONAA >60 03/02/2018 0435   GFRNONAA >60 02/06/2014 1628  GFRNONAA >60 12/18/2013 0435   GFRAA >60 03/02/2018 0435   GFRAA >60 02/06/2014 1628   GFRAA >60 12/18/2013 0435   Estimated Creatinine Clearance: 116.3 mL/min (by C-G formula based on SCr of 0.86 mg/dL).  COAG Lab Results  Component Value Date   INR 1.13 12/18/2014   Radiology Dg Foot Complete Left  Result Date: 03/01/2018 CLINICAL DATA:  Diabetic.  Ulceration.  Foot infection. EXAM: LEFT FOOT - COMPLETE 3+ VIEW COMPARISON:  None. FINDINGS: Pronounced soft tissue swelling of the forefoot. Soft tissue ulceration noted at the ball of the foot in the region of the great toe. Air/gas in the soft tissues. No sign of air/gas in the joint. No plain radiographic finding of osteomyelitis. Some degree of hallux valgus deformity. IMPRESSION: Forefoot soft tissue swelling. Ulceration at the ball of the foot underlying the MTP joint of the great  toe. Air/gas in the soft tissues. No plain radiographic finding of osteomyelitis or septic arthritis. Electronically Signed   By: Nelson Chimes M.D.   On: 03/01/2018 17:28   Assessment/Plan The patient is a 67 year old male with a past medical history of morbid obesity, BPH, chronic back pain, depression, diabetes, GERD, status post MI in the past, hypertension, obstructive sleep apnea, status post insertion of a spinal cord stimulator, status post stroke who presented to the [facility] emergency department complaining of worsening left foot pain and nonhealing ulcer. On 03/02/18, the patient underwent an excision infected sesamoid plantar left first MTPJ 2 incision and drainage abscess medial left foot. 1. PAD: Patient with a known past medical history of peripheral artery disease.  The patient is status post a right lower extremity angiogram with endovascular intervention and stent placement in 2015.  The patient was lost to follow-up.  The patient presents denying any claudication-like symptoms, rest pain or ulcer formation to his bilateral toes.  The patient notes that the wound which recently formed to his left foot was due to trauma after he fell out of his bed.  The patient does note that it was healing well until approximately 3 to 4 days ago when he started to experience progressively worsening pain to the foot prompting him to seek medical attention.  Unable to palpate pedal pulses on exam.  Due to the patient's past medical history of known peripheral artery disease and risk factors it would be reasonable to undergo an angiogram however this does not need to be done on emergent basis.  We can plan on possible angiogram on Monday if schedule permits.  If not the next possible schedule time would be Wednesday.  The patient does not need to the inpatient we can plan this as an outpatient. 2. Diabetes: Encouraged good control as its slows the progression of atherosclerotic  disease 3. Hypertension: Encouraged good control as its slows the progression of atherosclerotic disease  Discussed with Dr. Mayme Genta, PA-C  03/02/2018 3:13 PM  This note was created with Dragon medical transcription system.  Any error is purely unintentional.

## 2018-03-02 NOTE — Transfer of Care (Signed)
Immediate Anesthesia Transfer of Care Note  Patient: Victor Wise  Procedure(s) Performed: IRRIGATION AND DEBRIDEMENT FOOT (Left Foot)  Patient Location: PACU  Anesthesia Type:General  Level of Consciousness: awake, alert  and responds to stimulation  Airway & Oxygen Therapy: Patient Spontanous Breathing and Patient connected to face mask oxygen  Post-op Assessment: Report given to RN and Post -op Vital signs reviewed and stable  Post vital signs: Reviewed and stable  Last Vitals:  Vitals Value Taken Time  BP 137/63 03/02/2018 11:59 AM  Temp    Pulse 62 03/02/2018 11:59 AM  Resp 19 03/02/2018 11:59 AM  SpO2 100 % 03/02/2018 11:59 AM  Vitals shown include unvalidated device data.  Last Pain:  Vitals:   03/02/18 1032  TempSrc: Tympanic  PainSc: 0-No pain      Patients Stated Pain Goal: 9 (15/86/82 [id number])  Complications: No apparent anesthesia complications

## 2018-03-02 NOTE — Anesthesia Procedure Notes (Signed)
Procedure Name: Intubation Performed by: Lance Muss, CRNA Pre-anesthesia Checklist: Patient identified, Patient being monitored, Timeout performed, Emergency Drugs available and Suction available Patient Re-evaluated:Patient Re-evaluated prior to induction Oxygen Delivery Method: Circle system utilized Preoxygenation: Pre-oxygenation with 100% oxygen Induction Type: IV induction Ventilation: Mask ventilation without difficulty Laryngoscope Size: McGraph and 4 Grade View: Grade I Tube type: Oral Tube size: 7.0 mm Number of attempts: 1 Airway Equipment and Method: Stylet,  LTA kit utilized and Video-laryngoscopy Placement Confirmation: ETT inserted through vocal cords under direct vision,  positive ETCO2 and breath sounds checked- equal and bilateral Secured at: 24 cm Tube secured with: Tape Dental Injury: Teeth and Oropharynx as per pre-operative assessment  Future Recommendations: Recommend- induction with short-acting agent, and alternative techniques readily available

## 2018-03-02 NOTE — Plan of Care (Signed)
  Problem: Coping: Goal: Level of anxiety will decrease Outcome: Progressing   Problem: Pain Managment: Goal: General experience of comfort will improve Outcome: Progressing   Problem: Safety: Goal: Ability to remain free from injury will improve Outcome: Progressing   

## 2018-03-02 NOTE — Progress Notes (Signed)
Patient stated he thinks his home settings are around 9-16. Auto titrate is ON with a max set at 20 and minimum set at 12.

## 2018-03-02 NOTE — Anesthesia Post-op Follow-up Note (Signed)
Anesthesia QCDR form completed.        

## 2018-03-02 NOTE — Anesthesia Preprocedure Evaluation (Signed)
Anesthesia Evaluation  Patient identified by MRN, date of birth, ID band Patient awake    Reviewed: Allergy & Precautions, NPO status , Patient's Chart, lab work & pertinent test results  History of Anesthesia Complications Negative for: history of anesthetic complications  Airway Mallampati: III  TM Distance: >3 FB Neck ROM: Full    Dental  (+) Poor Dentition, Missing   Pulmonary sleep apnea and Continuous Positive Airway Pressure Ventilation , neg COPD, former smoker,    breath sounds clear to auscultation- rhonchi (-) wheezing      Cardiovascular hypertension, + Past MI, + Cardiac Stents (last stent ~3 yrs ago) and + Peripheral Vascular Disease  (-) CABG  Rhythm:Regular Rate:Normal - Systolic murmurs and - Diastolic murmurs    Neuro/Psych PSYCHIATRIC DISORDERS Depression CVA, No Residual Symptoms    GI/Hepatic Neg liver ROS, GERD  ,  Endo/Other  diabetes, Insulin Dependent  Renal/GU negative Renal ROS     Musculoskeletal negative musculoskeletal ROS (+)   Abdominal (+) + obese,   Peds  Hematology negative hematology ROS (+)   Anesthesia Other Findings Past Medical History: No date: Allergy No date: Basal cell carcinoma     Comment:  forehead No date: BPH (benign prostatic hyperplasia) No date: Chronic back pain No date: Depression No date: Diabetes ([facility]) No date: GERD (gastroesophageal reflux disease) No date: Heart attack ([place]) No date: Hypertension No date: Morbid obesity ([place]) No date: Obstructive sleep apnea No date: Osteomyelitis of foot ([facility]) No date: Status post insertion of spinal cord stimulator No date: Stroke [facility]) No date: UTI (lower urinary tract infection)   Reproductive/Obstetrics                             Anesthesia Physical Anesthesia Plan  ASA: III  Anesthesia Plan: General   Post-op Pain Management:    Induction: Intravenous  PONV Risk Score and  Plan: 1 and Ondansetron  Airway Management Planned: Oral ETT  Additional Equipment:   Intra-op Plan:   Post-operative Plan: Extubation in OR  Informed Consent: I have reviewed the patients History and Physical, chart, labs and discussed the procedure including the risks, benefits and alternatives for the proposed anesthesia with the patient or authorized representative who has indicated his/her understanding and acceptance.   Dental advisory given  Plan Discussed with: CRNA and Anesthesiologist  Anesthesia Plan Comments:         Anesthesia Quick Evaluation

## 2018-03-02 NOTE — Anesthesia Postprocedure Evaluation (Signed)
Anesthesia Post Note  Patient: Victor Wise  Procedure(s) Performed: IRRIGATION AND DEBRIDEMENT FOOT (Left Foot)  Patient location during evaluation: PACU Anesthesia Type: General Level of consciousness: awake and alert and oriented Pain management: pain level controlled Vital Signs Assessment: post-procedure vital signs reviewed and stable Respiratory status: spontaneous breathing, nonlabored ventilation and respiratory function stable Cardiovascular status: blood pressure returned to baseline and stable Postop Assessment: no signs of nausea or vomiting Anesthetic complications: no     Last Vitals:  Vitals:   03/02/18 1227 03/02/18 1255  BP: 119/76 123/61  Pulse: (!) 59 (!) 59  Resp: 16   Temp: 36.8 C 36.7 C  SpO2: 94% 97%    Last Pain:  Vitals:   03/02/18 1255  TempSrc: Oral  PainSc:                  [name]

## 2018-03-02 NOTE — Op Note (Signed)
Operative note   Surgeon:Carlotta Telfair Lawyer: None    Preop diagnosis: Left tabetic foot infection    Postop diagnosis: Same    Procedure: 1.  Excision infected sesamoid plantar left first MTPJ 2 incision and drainage abscess medial left foot    EBL: Minimal    Anesthesia:local and general.  Local consisted of a one-to-one mixture of 0.5% bupivacaine and 1% lidocaine with epinephrine    Hemostasis: Epinephrine 1-100,000 infiltrated along the incision site    Specimen: Deep wound culture and infected sesamoid plantar left foot    Complications: None    Operative indications:Victor Wise is an 67 y.o. that presents today for surgical intervention.  The risks/benefits/alternatives/complications have been discussed and consent has been given.    Procedure:  Patient was brought into the OR and placed on the operating table in thesupine position. After anesthesia was obtained theleft lower extremity was prepped and draped in usual sterile fashion.  Attention was directed to the plantar aspect of the left foot there was a noted 1/2 cm necrotic ulceration with foul odor and purulent drainage.  Full-thickness dissection of the ulceration was taken down to the tendon and sesamoid layer.  It was noted to be an abscess on the medial aspect of the great toe joint.  An incision was made along the medial aspect of the first MTPJ.  Full-thickness incision was taken down to the capsular layer.  Noted necrotic tissue was found with infected tissue as well.  A deep wound culture was performed into the area.  Excisional debridement taken down to the capsular layer was performed with a versa jet to the medial first MTPJ.  This was then flushed with copious amounts of irrigation.  The tibial sesamoid was found to be severely infected with all of the overlying soft tissue necrotic with foul odor.  At this time I elected to excise the tibial sesamoid in toto.  The long flexor tendon to the great toe was  noted and intact.  No proximal or distal purulent drainage was noted at this time.  The fibular sesamoid did not appear to be infected at this point.  After excision of the tibial sesamoid the plantar aspect of the first metatarsal head looked to be intact and grossly not contaminated or infected.  The wound was then flushed with copious amounts of irrigation.  The medial aspect of the incision was closed with a 3-0 nylon.  A small skin island was left between the medial incision and plantar ulceration.  The plantar ulceration was packed with iodoform packing.  A bulky sterile dressing was applied.    Patient tolerated the procedure and anesthesia well.  Was transported from the OR to the PACU with all vital signs stable and vascular status intact. To be discharged per routine protocol.  Will follow up in approximately 1 week in the outpatient clinic.

## 2018-03-02 NOTE — Consult Note (Signed)
ORTHOPAEDIC CONSULTATION  REQUESTING PHYSICIAN: Gladstone Lighter, MD  Chief Complaint: Left foot infection  HPI: Victor Wise is a 67 y.o. male who complains of pain and swelling left foot.  Admitted to the ER last night with infection in his left foot.  Patient states approximately a week ago he was quite ill.  He describes this as having flulike symptoms.  He noticed redness over the weekend with worsening pain into his left foot.  Brought to the ER with noted redness drainage and swelling from an ulcer on his left foot.  X-rays were concerning for gas in the soft tissues and admitted for further evaluation and possible I&D.  Patient has previous undergone amputation of his right great toe proximally 3 years ago.  He had stenting placed to the lower extremities at that time.  Past Medical History:  Diagnosis Date  . Allergy   . Basal cell carcinoma    forehead  . BPH (benign prostatic hyperplasia)   . Chronic back pain   . Depression   . Diabetes ([place])   . GERD (gastroesophageal reflux disease)   . Heart attack ([place])   . Hypertension   . Morbid obesity ([place])   . Obstructive sleep apnea   . 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 Status post insertion of spinal cord stimulator   . Stroke ([place])   . UTI (lower urinary tract infection)    Past Surgical History:  Procedure Laterality Date  . CARDIAC CATHETERIZATION N/A 12/19/2014   Procedure: Coronary Stent Intervention;  Surgeon: Charolette Forward, MD;  Location: [facility];  Service: Cardiovascular;  Laterality: N/A;  . CARDIAC CATHETERIZATION Left 12/19/2014   Procedure: Left Heart Cath and Coronary Angiography;  Surgeon: Dionisio Nehal, MD;  Location: [facility];  Service: Cardiovascular;  Laterality: Left;  . PACEMAKER INSERTION Left 11/02/2015   Procedure: INSERTION PACEMAKER;  Surgeon: Isaias Cowman, MD;  Location: [facility];  Service: Cardiovascular;  Laterality: Left;  . Pain Stimulator    . Right toe amputation      Social History   Socioeconomic History  . Marital status: Divorced    Spouse name: Not on file  . Number of children: Not on file  . Years of education: Not on file  . Highest education level: Not on file  Occupational History  . Not on file  Social Needs  . Financial resource strain: Not on file  . Food insecurity:    Worry: Not on file    Inability: Not on file  . Transportation needs:    Medical: Not on file    Non-medical: Not on file  Tobacco Use  . Smoking status: Former Smoker    Types: Cigarettes  . Smokeless tobacco: Never Used  . Tobacco comment: quit 45 years  Substance and Sexual Activity  . Alcohol use: Yes    Alcohol/week: 0.0 standard drinks    Comment: occasionally  . Drug use: No  . Sexual activity: Not on file  Lifestyle  . Physical activity:    Days per week: Not on file    Minutes per session: Not on file  . Stress: Not on file  Relationships  . Social connections:    Talks on phone: Not on file    Gets together: Not on file    Attends religious service: Not on file    Active member of club or organization: Not on file    Attends meetings of clubs or organizations: Not on file  Relationship status: Not on file  Other Topics Concern  . Not on file  Social History Narrative  . Not on file   Family History  Problem Relation Age of Onset  . Breast cancer Mother   . Cancer Mother   . Hypertension Mother   . Lung cancer Father   . Hypertension Father   . Heart disease Father   . Cancer Father   . Kidney disease Sister   . Prostate cancer Neg Hx    Allergies  Allergen Reactions  . Amoxicillin Shortness Of Breath, Itching and Other (See Comments)    Has patient had a PCN reaction causing immediate rash, facial/tongue/throat swelling, SOB or lightheadedness with hypotension: Yes Has patient had a PCN reaction causing severe rash involving mucus membranes or skin necrosis: No Has patient had a PCN reaction that required hospitalization  No Has patient had a PCN reaction occurring within the last 10 years: No If all of the above answers are "NO", then may proceed with Cephalosporin use.  . Other Anaphylaxis, Itching and Other (See Comments)    Pt states that he is allergic to Endopa.  Reaction:  Anaphylaxis  Pt states that he is allergic to Metabisulfites Reaction:  Itching   . Penicillins Shortness Of Breath, Itching and Other (See Comments)    Has patient had a PCN reaction causing immediate rash, facial/tongue/throat swelling, SOB or lightheadedness with hypotension: Yes Has patient had a PCN reaction causing severe rash involving mucus membranes or skin necrosis: No Has patient had a PCN reaction that required hospitalization No Has patient had a PCN reaction occurring within the last 10 years: No If all of the above answers are "NO", then may proceed with Cephalosporin use.  [name] Hydralazine Other (See Comments)    Reaction:  Cramping of extremities  . Crestor [Rosuvastatin Calcium] Other (See Comments)    Reaction:  Pt is unable to move arms/legs   . Metoprolol Nausea And Vomiting  . Red Dye Itching  . Statins Other (See Comments)    Reaction:  Pt is unable to move arms/legs  . Sulfa Antibiotics Itching    Other reaction(s): Unknown  . Yellow Dyes (Non-Tartrazine) Itching  . Aspirin Itching and Other (See Comments)    Pt states that he is able to use in lower doses.    . Tape Rash    Please use "paper" tape only. Please use "paper" tape only.   Prior to Admission medications   Medication Sig Start Date End Date Taking? Authorizing Provider  aspirin EC 81 MG tablet Take 81 mg by mouth daily.    [provider]  benazepril (LOTENSIN) 20 MG tablet TAKE 1 TABLET BY MOUTH TWICE DAILY 03/01/18   Ronnell Freshwater, NP  buPROPion (WELLBUTRIN XL) 300 MG 24 hr tablet Take 300 mg by mouth daily.     [provider]  busPIRone (BUSPAR) 10 MG tablet Take 10 mg by mouth at bedtime.    [provider]   clopidogrel (PLAVIX) 75 MG tablet Take 1 tablet (75 mg total) by mouth daily. 07/17/17   Ronnell Freshwater, NP  DULoxetine (CYMBALTA) 30 MG capsule Take 30-60 mg by mouth 2 (two) times daily. Pt takes two capsules in the morning and one at night.    [provider]  Exenatide ER (BYDUREON) 2 MG PEN Inject 2 mg into the skin once a week. 07/17/17   Ronnell Freshwater, NP  fenofibrate 54 MG tablet TAKE 1 TABLET BY MOUTH  A DAY AT SUPPERTIME 12/06/17   Ronnell Freshwater, NP  finasteride (PROSCAR) 5 MG tablet Take 1 tablet (5 mg total) by mouth daily. 02/22/16   Gladstone Lighter, MD  fluticasone (FLONASE) 50 MCG/ACT nasal spray Place 2 sprays into both nostrils daily.    [provider]  hydrochlorothiazide (HYDRODIURIL) 12.5 MG tablet Take 1 tablet (12.5 mg total) by mouth daily. 09/04/17   Ronnell Freshwater, NP  insulin glargine (LANTUS) 100 UNIT/ML injection Inject 0.6 mLs (60 Units total) into the skin daily. 09/05/17   Ronnell Freshwater, NP  Insulin Pen Needle (B-D UF III MINI PEN NEEDLES) 31G X 5 MM MISC Use as directed with insulin e11.65 12/26/17   Ronnell Freshwater, NP  insulin regular (NOVOLIN R) 100 units/mL injection Patient to use Novolin R [place] TiD prior to meals per sliding scale instructions. Max daily dose is 45 units per day. 07/19/17   Ronnell Freshwater, NP  INSULIN SYRINGE 1CC/29G 29G X 1/2" 1 ML MISC Insulin injections QID, use with lantus and regular insulin. DX E11.65 12/28/17   Ronnell Freshwater, NP  Insulin Syringe-Needle U-100 (BD INSULIN SYRINGE U/F 1/2UNIT) 31G X 5/16" 0.3 ML MISC Indulin injection QID. Use with lantus and regular insulin Dx. 11.65 01/08/18   Lavera Guise, MD  NARCAN 4 MG/0.1ML LIQD Take 4 mg by mouth as needed (for opioid overdose).     [provider]  nitroGLYCERIN (NITROSTAT) 0.4 MG SL tablet Place 1 tablet (0.4 mg total) under the tongue every 5 (five) minutes x 3 doses as needed for chest pain. 12/21/14   Dixie Dials, MD  omega-3 acid ethyl  esters (LOVAZA) 1 g capsule Take 1 capsule (1 g total) by mouth 2 (two) times daily. 12/28/17   Ronnell Freshwater, NP  Oxcarbazepine (TRILEPTAL) 300 MG tablet Take 300-600 mg by mouth 2 (two) times daily. Pt takes one tablet in the morning and two at night.    [provider]  oxyCODONE (OXY IR/ROXICODONE) 5 MG immediate release tablet Take 1 tablet (5 mg total) by mouth 3 (three) times daily as needed for severe pain. 11/03/15   Dustin Flock, MD  oxyCODONE (OXYCONTIN) 20 mg 12 hr tablet Take 1 tablet (20 mg total) by mouth every 12 (twelve) hours. 11/03/15   Dustin Flock, MD  pantoprazole (PROTONIX) 40 MG tablet TAKE 1 TABLET BY MOUTH A DAY FOR REFLUX 12/26/17   Ronnell Freshwater, NP  polyethylene glycol (MIRALAX / GLYCOLAX) packet Take 17 g by mouth daily as needed.    [provider]  pregabalin (LYRICA) 200 MG capsule Take 200 mg by mouth 3 (three) times daily. Pt takes two capsules in the morning and one at night.    [provider]  senna (SENOKOT) 8.6 MG TABS tablet Take 1 tablet (8.6 mg total) by mouth 2 (two) times daily. 02/21/16   Gladstone Lighter, MD  tamsulosin (FLOMAX) 0.4 MG CAPS capsule Take 1 capsule (0.4 mg total) by mouth daily after supper. 12/27/17   Ronnell Freshwater, NP   Dg Foot Complete Left  Result Date: 03/01/2018 CLINICAL DATA:  Diabetic.  Ulceration.  Foot infection. EXAM: LEFT FOOT - COMPLETE 3+ VIEW COMPARISON:  None. FINDINGS: Pronounced soft tissue swelling of the forefoot. Soft tissue ulceration noted at the ball of the foot in the region of the great toe. Air/gas in the soft tissues. No sign of air/gas in the joint. No plain radiographic finding of osteomyelitis. Some degree of hallux  valgus deformity. IMPRESSION: Forefoot soft tissue swelling. Ulceration at the ball of the foot underlying the MTP joint of the great toe. Air/gas in the soft tissues. No plain radiographic finding of osteomyelitis or septic arthritis. Electronically Signed    By: Nelson Chimes M.D.   On: 03/01/2018 17:28    Positive ROS: All other systems have been reviewed and were otherwise negative with the exception of those mentioned in the HPI and as above.  12 point ROS was performed.  Physical Exam: General: Alert and oriented.  No apparent distress.  Vascular:  Left foot:Dorsalis Pedis:  absent Posterior Tibial:  absent  Right foot: Dorsalis Pedis:  absent Posterior Tibial:  absent.  Capillary fill time is brisk to all digits.  Neuro:absent.  He does have some gross sensation with discomfort with palpation to the left foot.  Derm: Right foot without ulceration.  Minute abrasions on the dorsal aspect of all the digits.  Well-healed scar from great toe amputation on the right foot.  Left foot with noted diffuse cellulitis from the first MTPJ proximally to about the level of the ankle joint.  Open ulceration approximately 2 cm in diameter with some fluctuance.  A superficial bolus with scant purulence noted on the medial aspect of the first MTPJ.  Ortho/MS: Status post right great toe amputation.  Left foot with diffuse edema.  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  I did review the x-rays and it is negative for obvious osteomyelitis though the ulceration is just plantar to the first MTPJ and it could involve the plantar sesamoid.  Will open and drain the area and possibly remove the sesamoid if this is obviously infected.  Will evaluate for further destructive changes intraoperatively.  Deep wound culture is planned intraoperatively.  He has been n.p.o. since midnight last night.  I discussed with the patient and consent has been given.  We will also ask vascular surgery to evaluate as he has undergone lower extremity revascularization with previous great toe amputation.    Elesa Hacker, DPM Cell  [phone]   03/02/2018 8:19 AM

## 2018-03-03 ENCOUNTER — Encounter: Payer: Self-pay | Admitting: Podiatry

## 2018-03-03 LAB — BASIC METABOLIC PANEL
ANION GAP: 8 (ref 5–15)
BUN: 14 mg/dL (ref 8–23)
CHLORIDE: 102 mmol/L (ref 98–111)
CO2: 29 mmol/L (ref 22–32)
Calcium: 8.3 mg/dL — ABNORMAL LOW (ref 8.9–10.3)
Creatinine, Ser: 1.05 mg/dL (ref 0.61–1.24)
GFR calc Af Amer: >60 mL/min (ref >60)
Glucose, Bld: 200 mg/dL — ABNORMAL HIGH (ref 70–99)
POTASSIUM: 4.1 mmol/L (ref 3.5–5.1)
SODIUM: 139 mmol/L (ref 135–145)

## 2018-03-03 LAB — CBC
HCT: 33.7 % — ABNORMAL LOW (ref 39.0–52.0)
HEMOGLOBIN: 10.5 g/dL — AB (ref 13.0–17.0)
MCH: 25.1 pg — ABNORMAL LOW (ref 26.0–34.0)
MCHC: 31.2 g/dL (ref 30.0–36.0)
MCV: 80.6 fL (ref 80.0–100.0)
Platelets: 225 10*3/uL (ref 150–400)
RBC: 4.18 MIL/uL — AB (ref 4.22–5.81)
RDW: 16 % — ABNORMAL HIGH (ref 11.5–15.5)
WBC: 6.4 10*3/uL (ref 4.0–10.5)
nRBC: 0 % (ref 0.0–0.2)

## 2018-03-03 LAB — GLUCOSE, CAPILLARY
GLUCOSE-CAPILLARY: 188 mg/dL — AB (ref 70–99)
GLUCOSE-CAPILLARY: 211 mg/dL — AB (ref 70–99)
GLUCOSE-CAPILLARY: 212 mg/dL — AB (ref 70–99)
GLUCOSE-CAPILLARY: 213 mg/dL — AB (ref 70–99)
Glucose-Capillary: 204 mg/dL — ABNORMAL HIGH (ref 70–99)
Glucose-Capillary: 204 mg/dL — ABNORMAL HIGH (ref 70–99)

## 2018-03-03 LAB — HEMOGLOBIN A1C
Hgb A1c MFr Bld: 8 % — ABNORMAL HIGH (ref 4.8–5.6)
MEAN PLASMA GLUCOSE: 182.9 mg/dL

## 2018-03-03 LAB — HIV ANTIBODY (ROUTINE TESTING W REFLEX): HIV Screen 4th Generation wRfx: NONREACTIVE

## 2018-03-03 LAB — VANCOMYCIN, TROUGH: Vancomycin Tr: 17 ug/mL (ref 15–20)

## 2018-03-03 MED ORDER — INSULIN ASPART 100 UNIT/ML ~~LOC~~ SOLN
0.0000 [IU] | Freq: Every day | SUBCUTANEOUS | Status: DC
  Administered 2018-03-03 – 2018-03-12 (×3): 2 [IU] via SUBCUTANEOUS
  Filled 2018-03-03 (×3): qty 1

## 2018-03-03 MED ORDER — INSULIN ASPART 100 UNIT/ML ~~LOC~~ SOLN
0.0000 [IU] | Freq: Three times a day (TID) | SUBCUTANEOUS | Status: DC
  Administered 2018-03-03 – 2018-03-04 (×2): 3 [IU] via SUBCUTANEOUS
  Administered 2018-03-04: 2 [IU] via SUBCUTANEOUS
  Administered 2018-03-04: 3 [IU] via SUBCUTANEOUS
  Administered 2018-03-05 – 2018-03-09 (×11): 2 [IU] via SUBCUTANEOUS
  Administered 2018-03-09: 1 [IU] via SUBCUTANEOUS
  Administered 2018-03-10: 3 [IU] via SUBCUTANEOUS
  Administered 2018-03-10 – 2018-03-11 (×5): 2 [IU] via SUBCUTANEOUS
  Administered 2018-03-12: 3 [IU] via SUBCUTANEOUS
  Administered 2018-03-12: 2 [IU] via SUBCUTANEOUS
  Administered 2018-03-12 – 2018-03-13 (×3): 3 [IU] via SUBCUTANEOUS
  Administered 2018-03-13: 2 [IU] via SUBCUTANEOUS
  Filled 2018-03-03 (×29): qty 1

## 2018-03-03 MED ORDER — LINAGLIPTIN 5 MG PO TABS
5.0000 mg | ORAL_TABLET | Freq: Every day | ORAL | Status: DC
  Administered 2018-03-03 – 2018-03-13 (×11): 5 mg via ORAL
  Filled 2018-03-03 (×11): qty 1

## 2018-03-03 MED ORDER — LACTULOSE 10 GM/15ML PO SOLN: 20.0000 g | Freq: Every day | ORAL | Status: DC | PRN

## 2018-03-03 MED ORDER — POLYETHYLENE GLYCOL 3350 17 G PO PACK
17.0000 g | PACK | Freq: Every day | ORAL | Status: DC
  Administered 2018-03-03 – 2018-03-13 (×8): 17 g via ORAL
  Filled 2018-03-03 (×8): qty 1

## 2018-03-03 MED ORDER — DOCUSATE SODIUM 100 MG PO CAPS
100.0000 mg | ORAL_CAPSULE | Freq: Two times a day (BID) | ORAL | Status: DC
  Administered 2018-03-03 – 2018-03-07 (×7): 100 mg via ORAL
  Filled 2018-03-03 (×7): qty 1

## 2018-03-03 NOTE — Progress Notes (Addendum)
[facility] NAME: Victor Wise    MR#:  [id number]  DATE OF BIRTH:  Aug 17, 1950  SUBJECTIVE:  CHIEF COMPLAINT:   Chief Complaint  Patient presents with  . Wound Infection  . Foot Pain   - left foot wound. S/p deep I&D in OR yesterday - complains of constipation  REVIEW OF SYSTEMS:  Review of Systems  Constitutional: Negative for chills and fever.  HENT: Negative for ear pain, hearing loss and tinnitus.   Eyes: Negative for blurred vision and double vision.  Respiratory: Negative for cough, shortness of breath and wheezing.   Cardiovascular: Positive for leg swelling. Negative for chest pain and palpitations.  Gastrointestinal: Positive for constipation. Negative for abdominal pain, diarrhea, nausea and vomiting.  Genitourinary: Negative for dysuria.  Musculoskeletal: Negative for myalgias.  Skin: Positive for rash.  Neurological: Negative for dizziness, focal weakness, seizures, weakness and headaches.  Psychiatric/Behavioral: Negative for depression.    DRUG ALLERGIES:   Allergies  Allergen Reactions  . Amoxicillin Shortness Of Breath, Itching and Other (See Comments)    Has patient had a PCN reaction causing immediate rash, facial/tongue/throat swelling, SOB or lightheadedness with hypotension: Yes Has patient had a PCN reaction causing severe rash involving mucus membranes or skin necrosis: No Has patient had a PCN reaction that required hospitalization No Has patient had a PCN reaction occurring within the last 10 years: No If all of the above answers are "NO", then may proceed with Cephalosporin use.  . Other Anaphylaxis, Itching and Other (See Comments)    Pt states that he is allergic to Endopa.  Reaction:  Anaphylaxis  Pt states that he is allergic to Metabisulfites Reaction:  Itching   . Penicillins Shortness Of Breath, Itching and Other (See Comments)    Has patient had a PCN reaction causing immediate rash,  facial/tongue/throat swelling, SOB or lightheadedness with hypotension: Yes Has patient had a PCN reaction causing severe rash involving mucus membranes or skin necrosis: No Has patient had a PCN reaction that required hospitalization No Has patient had a PCN reaction occurring within the last 10 years: No If all of the above answers are "NO", then may proceed with Cephalosporin use.  [name] Hydralazine Other (See Comments)    Reaction:  Cramping of extremities  . Crestor [Rosuvastatin Calcium] Other (See Comments)    Reaction:  Pt is unable to move arms/legs   . Metoprolol Nausea And Vomiting  . Red Dye Itching  . Statins Other (See Comments)    Reaction:  Pt is unable to move arms/legs  . Sulfa Antibiotics Itching    Other reaction(s): Unknown  . Yellow Dyes (Non-Tartrazine) Itching  . Aspirin Itching and Other (See Comments)    Pt states that he is able to use in lower doses.    . Tape Rash    Please use "paper" tape only. Please use "paper" tape only.    VITALS:  Blood pressure 125/64, pulse (!) 59, temperature 98.1 F (36.7 C), temperature source Oral, resp. rate 20, height 5\' 8"  (1.727 m), weight (!) 140.6 kg, SpO2 95 %.  PHYSICAL EXAMINATION:  Physical Exam  GENERAL:  67 y.o.-year-old obese patient lying in the bed with no acute distress.  EYES: Pupils equal, round, reactive to light and accommodation. No scleral icterus. Extraocular muscles intact.  HEENT: Head atraumatic, normocephalic. Oropharynx and nasopharynx clear.  NECK:  Supple, no jugular venous distention. No thyroid enlargement, no tenderness.  LUNGS: Normal breath  sounds bilaterally, no wheezing, rales,rhonchi or crepitation. No use of accessory muscles of respiration.  Decreased bibasilar breath sounds CARDIOVASCULAR: S1, S2 normal. No murmurs, rubs, or gallops.  ABDOMEN: Soft, nontender, nondistended. Bowel sounds present. No organomegaly or mass.  EXTREMITIES: No pedal edema, cyanosis, or clubbing.  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  Left foot dressing in place. NEUROLOGIC: Cranial nerves II through XII are intact. Muscle strength 5/5 in all extremities. Sensation intact. Gait not checked.  PSYCHIATRIC: The patient is alert and oriented x 3.  SKIN: No obvious rash, lesion, or ulcer.    LABORATORY PANEL:   CBC Recent Labs  Lab 03/03/18 0726  WBC 6.4  HGB 10.5*  HCT 33.7*  PLT 225   ------------------------------------------------------------------------------------------------------------------  Chemistries  Recent Labs  Lab 03/03/18 0726  NA 139  K 4.1  CL 102  CO2 29  GLUCOSE 200*  BUN 14  CREATININE 1.05  CALCIUM 8.3*   ------------------------------------------------------------------------------------------------------------------  Cardiac Enzymes No results for input(s): TROPONINI in the last 168 hours. ------------------------------------------------------------------------------------------------------------------  RADIOLOGY:  Dg Foot Complete Left  Result Date: 03/01/2018 CLINICAL DATA:  Diabetic.  Ulceration.  Foot infection. EXAM: LEFT FOOT - COMPLETE 3+ VIEW COMPARISON:  None. FINDINGS: Pronounced soft tissue swelling of the forefoot. Soft tissue ulceration noted at the ball of the foot in the region of the great toe. Air/gas in the soft tissues. No sign of air/gas in the joint. No plain radiographic finding of osteomyelitis. Some degree of hallux valgus deformity. IMPRESSION: Forefoot soft tissue swelling. Ulceration at the ball of the foot underlying the MTP joint of the great toe. Air/gas in the soft tissues. No plain radiographic finding of osteomyelitis or septic arthritis. Electronically Signed   By: Nelson Chimes M.D.   On: 03/01/2018 17:28    EKG:   Orders placed or performed during the hospital encounter of 10/26/15  . EKG 12-Lead  . EKG 12-Lead  . EKG 12-Lead in am (before 8am)  . EKG 12-Lead in am (before 8am)  . EKG     ASSESSMENT AND PLAN:   67 year old male with past medical history significant for diabetes, hypertension, chronic low back pain and CAD resents to hospital secondary to left foot diabetic ulcer  1.  Left foot cellulitis and diabetic foot ulcer-appreciate podiatry consult - left plantar 1st MTP ulcer- s/p I&D and packing done -On vancomycin and meropenem -X-rays negative for osteomyelitis -Dressing changes per podiatry -Follow-up cultures and continue antibiotics at this time -partial Weightbearing as tolerated -Also vascular surgery consult for possible revascularization-as poor blood flow noted during surgery. -For angiogram on Monday likely  2.  Diabetes mellitus-sliding scale insulin.  A1c of 8 -We will add januvia/tradjenta here -Forming can be added after discharge in case he needs an angiogram and contrast exposure  3.  Chronic back pain-patient on Lyrica, Cymbalta, oxycodone and OxyContin  4.  Hypertension-benazepril and hydrochlorothiazide.  5.  DVT prophylaxis-on Lovenox  6.  Constipation-meds added    All the records are reviewed and case discussed with Care Management/Social Workerr. Management plans discussed with the patient, family and they are in agreement.  CODE STATUS: Full code  TOTAL TIME TAKING CARE OF THIS PATIENT: 38 minutes.   POSSIBLE D/C IN 2 DAYS, DEPENDING ON CLINICAL CONDITION.   Gladstone Lighter M.D on 03/03/2018 at 2:07 PM  Between 7am to 6pm - Pager - [phone]  After 6pm go to [email or web address] - Proofreader  [employer]  [phone]  CC: Primary care physician; Lavera Guise, MD

## 2018-03-03 NOTE — Progress Notes (Signed)
Pharmacy Antibiotic Note  Victor Wise is a 67 y.o. male admitted on 03/01/2018 with wound infection.  Pharmacy has been consulted for Vancomycin, Meropenem dosing.  Plan:  Vancomycin 1 gm IV X 1 given in ED on 10/17 @ 18:00. Vancomycin 1250 mg IV Q8H ordered to start 10/18 @ 00:00 , ~ 6 hrs after 1st dose (stacked dosing). This pt will reach Css by 10/19 @ 0600.   10/19 @ 0730 VT = 17 mcg/ml. Continue current dosing.   Meropenem 1 gm IV Q8H ordered to start on 10/17 @ 23:00.   Height: 5\' 8"  (172.7 cm) Weight: (!) 310 lb (140.6 kg) IBW/kg (Calculated) : 68.4  Temp (24hrs), Avg:98.4 F (36.9 C), Min:97.6 F (36.4 C), Max:99.5 F (37.5 C)  Recent Labs  Lab 03/01/18 1514 03/02/18 0435 03/03/18 0726  WBC 9.1 7.2 6.4  CREATININE 0.91 0.86 1.05  VANCOTROUGH  --   --  17    Estimated Creatinine Clearance: 95.2 mL/min (by C-G formula based on SCr of 1.05 mg/dL).    Allergies  Allergen Reactions  . Amoxicillin Shortness Of Breath, Itching and Other (See Comments)    Has patient had a PCN reaction causing immediate rash, facial/tongue/throat swelling, SOB or lightheadedness with hypotension: Yes Has patient had a PCN reaction causing severe rash involving mucus membranes or skin necrosis: No Has patient had a PCN reaction that required hospitalization No Has patient had a PCN reaction occurring within the last 10 years: No If all of the above answers are "NO", then may proceed with Cephalosporin use.  . Other Anaphylaxis, Itching and Other (See Comments)    Pt states that he is allergic to Endopa.  Reaction:  Anaphylaxis  Pt states that he is allergic to Metabisulfites Reaction:  Itching   . Penicillins Shortness Of Breath, Itching and Other (See Comments)    Has patient had a PCN reaction causing immediate rash, facial/tongue/throat swelling, SOB or lightheadedness with hypotension: Yes Has patient had a PCN reaction causing severe rash involving mucus membranes or skin  necrosis: No Has patient had a PCN reaction that required hospitalization No Has patient had a PCN reaction occurring within the last 10 years: No If all of the above answers are "NO", then may proceed with Cephalosporin use.  [name] Hydralazine Other (See Comments)    Reaction:  Cramping of extremities  . Crestor [Rosuvastatin Calcium] Other (See Comments)    Reaction:  Pt is unable to move arms/legs   . Metoprolol Nausea And Vomiting  . Red Dye Itching  . Statins Other (See Comments)    Reaction:  Pt is unable to move arms/legs  . Sulfa Antibiotics Itching    Other reaction(s): Unknown  . Yellow Dyes (Non-Tartrazine) Itching  . Aspirin Itching and Other (See Comments)    Pt states that he is able to use in lower doses.    . Tape Rash    Please use "paper" tape only. Please use "paper" tape only.    Antimicrobials this admission:   >>    >>   Dose adjustments this admission:   Microbiology results:  BCx:   UCx:    Sputum:    MRSA PCR:   Thank you for allowing pharmacy to be a part of this patient's care.  Olivia Canter, [facility] 03/03/2018 8:04 AM

## 2018-03-03 NOTE — Evaluation (Signed)
Physical Therapy Evaluation Patient Details Name: Victor Wise MRN: [id number] DOB: 04-Jan-1951 Today's Date: 03/03/2018   History of Present Illness  Patient admitted s/p I&D of medial L foot.  PMH includes stroke, spinal cord stimulator, MI, diabetes, depression and Htn.  Clinical Impression  Patient is a 67 year old male who lives in a one story home alone.  He uses a WC and SPC or RW for ambulation at baseline.  He was a limited community ambulator prior to surgery and obtained his own groceries.  Pt required min-mod A for bed mobility and presented with fair to good strength of UE's and LE's not limited by pain and dressings.  He reported no significant loss of sensation in L toes.  Pt required 2 attempts to stand from EOB and required that bed be elevated before he could perform a successful transfer.  NA in for +2 assist for safety.  Pt able to perform a standing pivot to chair with frequent VC's for management of PWB status of L LE on heel.  Pt concerned about daily management of mobility but also that he will end up with a bill if he goes to SNF.  Pt advised to discuss this further with Education officer, museum.  Pt agreeable to this and all other education regarding use of post op boots and follow up therapy.  Pt will benefit from continuation of PT with focus on strength, safe functional mobility and fall prevention.    Follow Up Recommendations SNF(Patient is concerned about cost of SNF.)    Equipment Recommendations  None recommended by PT    Recommendations for Other Services       Precautions / Restrictions Precautions Precautions: Fall Restrictions Weight Bearing Restrictions: Yes LLE Weight Bearing: Partial weight bearing LLE Partial Weight Bearing Percentage or Pounds: 50%      Mobility  Bed Mobility Overal bed mobility: Needs Assistance Bed Mobility: Supine to Sit     Supine to sit: Mod assist     General bed mobility comments: hand held assist to sit upright.  Pt able to  balance at EOB once assistance given to initiate movement.  Transfers Overall transfer level: Needs assistance Equipment used: Rolling walker (2 wheeled) Transfers: Stand Pivot Transfers;Sit to/from Stand Sit to Stand: Mod assist;From elevated surface Stand pivot transfers: Min assist       General transfer comment: Required two attempts to stand from bedside;  required bed to be elevated on second attempt.  Assistance with management of RW and VC's to maintain PWB on L heel.  Ambulation/Gait                Stairs            Wheelchair Mobility    Modified Rankin (Stroke Patients Only)       Balance Overall balance assessment: Needs assistance Sitting-balance support: Feet supported Sitting balance-Leahy Scale: Good     Standing balance support: Bilateral upper extremity supported Standing balance-Leahy Scale: Poor                               Pertinent Vitals/Pain Pain Assessment: Faces Faces Pain Scale: Hurts a little bit Pain Location: L foot Pain Intervention(s): Monitored during session    Home Living Family/patient expects to be discharged to:: Skilled nursing facility Living Arrangements: Children               Additional Comments: Patient lives alone but states that  his son, daughter in law and neighbor are available to help him.    Prior Function Level of Independence: Independent with assistive device(s)         Comments: Pt uses WC primarily throughout the day but also sometimes walks with a RW when he needs to go outside briefly.     Hand Dominance        Extremity/Trunk Assessment   Upper Extremity Assessment Upper Extremity Assessment: Overall WFL for tasks assessed;Defer to OT evaluation(No significan asymmetry.  Grossly 4/5 bilaterally.  Visible tremor in R UE.)    Lower Extremity Assessment Lower Extremity Assessment: Overall WFL for tasks assessed(Unable to test ankle strength due to casting; reports  no sensation loss in toes.  Pt stated that he had noticed some loss of strenth on L side following stroke.)    Cervical / Trunk Assessment Cervical / Trunk Assessment: Normal  Communication   Communication: No difficulties  Cognition Arousal/Alertness: Awake/alert Behavior During Therapy: WFL for tasks assessed/performed Overall Cognitive Status: Within Functional Limits for tasks assessed                                 General Comments: Slight delay with responses which pt states he has had since his stroke.  Stated that he needs time to process information before responding.      General Comments      Exercises Other Exercises Other Exercises: Education regarding use of post-op boots and WB status x4 min Other Exercises: Education regarding importance of follow up therapy and benefit of SNF. x 4 min   Assessment/Plan    PT Assessment Patient needs continued PT services  PT Problem List Decreased strength;Decreased mobility;Decreased balance;Decreased knowledge of use of DME;Decreased activity tolerance       PT Treatment Interventions DME instruction;Therapeutic activities;Gait training;Therapeutic exercise;Patient/family education;Balance training;Functional mobility training    PT Goals (Current goals can be found in the Care Plan section)  Acute Rehab PT Goals Patient Stated Goal: To return home and back to baseline level of function. PT Goal Formulation: With patient Time For Goal Achievement: 03/17/18 Potential to Achieve Goals: Good    Frequency Min 2X/week   Barriers to discharge        Co-evaluation               AM-PAC PT "6 Clicks" Daily Activity  Outcome Measure Difficulty turning over in bed (including adjusting bedclothes, sheets and blankets)?: A Lot Difficulty moving from lying on back to sitting on the side of the bed? : A Lot Difficulty sitting down on and standing up from a chair with arms (e.g., wheelchair, bedside commode,  etc,.)?: A Lot Help needed moving to and from a bed to chair (including a wheelchair)?: A Lot Help needed walking in hospital room?: A Lot Help needed climbing 3-5 steps with a railing? : A Lot 6 Click Score: 12    End of Session Equipment Utilized During Treatment: Gait belt Activity Tolerance: Patient tolerated treatment well Patient left: in chair;with chair alarm set;with call bell/phone within reach Nurse Communication: Mobility status PT Visit Diagnosis: Unsteadiness on feet (R26.81);Muscle weakness (generalized) (M62.81);Pain Pain - Right/Left: Left Pain - part of body: Ankle and joints of foot    Time: 1527-1600 PT Time Calculation (min) (ACUTE ONLY): 33 min   Charges:   PT Evaluation $PT Eval Moderate Complexity: 1 Mod PT Treatments $Therapeutic Activity: 8-22 mins  Roxanne Gates, PT, DPT   Roxanne Gates 03/03/2018, 4:59 PM

## 2018-03-03 NOTE — Progress Notes (Signed)
Daily Progress Note   Subjective  - 1 Day Post-Op  F/u left foot I & D.  Objective Vitals:   03/02/18 1653 03/02/18 1958 03/02/18 2350 03/03/18 0417  BP: (!) 119/57 134/66 (!) 143/84 (!) 141/66  Pulse: (!) 59 63 60 (!) 59  Resp: 18 (!) 22 19 19   Temp: 98.2 F (36.8 C) 98.2 F (36.8 C) 98.8 F (37.1 C) 99.5 F (37.5 C)  TempSrc: Oral Oral Oral Oral  SpO2: 93% 97% 97% 94%  Weight:      Height:        Physical Exam: Erythema improved. No purulence.  Plantar left 1st mtpj open ulcer.  Packing removed.    Laboratory CBC    Component Value Date/Time   WBC 6.4 03/03/2018 0726   HGB 10.5 (L) 03/03/2018 0726   HGB 11.6 (L) 02/06/2014 1628   HCT 33.7 (L) 03/03/2018 0726   HCT 35.9 (L) 02/06/2014 1628   PLT 225 03/03/2018 0726   PLT 221 02/06/2014 1628    BMET    Component Value Date/Time   NA 139 03/03/2018 0726   NA 138 02/06/2014 1628   K 4.1 03/03/2018 0726   K 4.6 02/06/2014 1628   CL 102 03/03/2018 0726   CL 102 02/06/2014 1628   CO2 29 03/03/2018 0726   CO2 30 02/06/2014 1628   GLUCOSE 200 (H) 03/03/2018 0726   GLUCOSE 139 (H) 02/06/2014 1628   BUN 14 03/03/2018 0726   BUN 31 (H) 02/06/2014 1628   CREATININE 1.05 03/03/2018 0726   CREATININE 1.16 02/06/2014 1628   CALCIUM 8.3 (L) 03/03/2018 0726   CALCIUM 8.6 02/06/2014 1628   GFRNONAA >60 03/03/2018 0726   GFRNONAA >60 02/06/2014 1628   GFRNONAA >60 12/18/2013 0435   GFRAA >60 03/03/2018 0726   GFRAA >60 02/06/2014 1628   GFRAA >60 12/18/2013 0435    Assessment/Planning: PVD with abscess DM with neuropathy   Pt had very poor perfusion intra-op and will need angio.  C/W IV abx.  Cultures sent.    PT to see for partal wb left.  Samara Deist A  03/03/2018, 9:28 AM

## 2018-03-04 LAB — GLUCOSE, CAPILLARY
GLUCOSE-CAPILLARY: 212 mg/dL — AB (ref 70–99)
GLUCOSE-CAPILLARY: 147 mg/dL — AB (ref 70–99)
Glucose-Capillary: 230 mg/dL — ABNORMAL HIGH (ref 70–99)
Glucose-Capillary: 186 mg/dL — ABNORMAL HIGH (ref 70–99)

## 2018-03-04 NOTE — Clinical Social Work Note (Signed)
Clinical Social Work Assessment  Patient Details  Name: Victor Wise MRN: [id number] Date of Birth: [birth year]  Date of referral:  03/04/18               Reason for consult:  Facility Placement                Permission sought to share information with:  Chartered certified accountant granted to share information::  Yes, Verbal Permission Granted  Name::        Agency::  [facility]  Relationship::     Contact Information:     Housing/Transportation Living arrangements for the past 2 months:  Apartment Source of Information:  Patient, Medical Team Patient Interpreter Needed:  None Criminal Activity/Legal Involvement Pertinent to Current Situation/Hospitalization:  No - Comment as needed Significant Relationships:  Adult Children, Warehouse manager Lives with:  Self Do you feel safe going back to the place where you live?  Yes Need for family participation in patient care:  No (Coment)  Care giving concerns:  PT recommendation for SNF   Social Worker assessment / plan:  The CSW met with the patient at bedside to discuss discharge planning. The CSW introduced self and explained role in care. The patient seemed apprehensive about SNF and explained that he has in the past had to pay $2000 due to a facility not appealing a denial by his insurance company. The patient admitted that this event happened over 3 years ago. The CSW explained the referral process and insurance authorization process. The CSW explained that the patient can choose to decline SNF; however, having the referral would give the patient more options for discharge planning. The patient verbalized consent to make the referral to SNFs who accept [facility].  The patient lives alone and says that his daughter can assist him somewhat. The patient seems prone to rumination; however, he is redirectable and pleasant. The CSW will continue to follow to assist with discharge planning.  Employment  status:  Retired Nurse, adult PT Recommendations:  [place] / Referral to community resources:  [place]  Patient/Family's Response to care:  The patient thanked the CSW. The patient seemed anxious.  Patient/Family's Understanding of and Emotional Response to Diagnosis, Current Treatment, and Prognosis:  The patient seems to understand the different discharge options (SNF vs HH) and is not sure which he would prefer at this time.  Emotional Assessment Appearance:    Attitude/Demeanor/Rapport:  Apprehensive, Gracious Affect (typically observed):  Pleasant, Apprehensive Orientation:  Oriented to Self, Oriented to Place, Oriented to  Time, Oriented to Situation Alcohol / Substance use:  Never Used Psych involvement (Current and /or in the community):  No (Comment)  Discharge Needs  Concerns to be addressed:  Care Coordination, Discharge Planning Concerns Readmission within the last 30 days:  No Current discharge risk:  Chronically ill, Lives alone, Physical Impairment Barriers to Discharge:  Continued Medical Work up   [employer], LCSW 03/04/2018, 3:28 PM

## 2018-03-04 NOTE — Progress Notes (Signed)
Daily Progress Note   Subjective  - 2 Days Post-Op  F/u left foot I & D  Objective Vitals:   03/03/18 0930 03/03/18 1647 03/03/18 2345 03/04/18 0918  BP: 125/64 139/72 (!) 156/65 (!) 145/75  Pulse: (!) 59 (!) 59 61 62  Resp: 20 18 19 20   Temp: 98.1 F (36.7 C)  98 F (36.7 C) 98.5 F (36.9 C)  TempSrc: Oral  Oral Oral  SpO2: 95% 99% 97% 92%  Weight:      Height:        Physical Exam: Erythema remains decreased.  Plantar ulcer with scant purulent drainage.   Laboratory CBC    Component Value Date/Time   WBC 6.4 03/03/2018 0726   HGB 10.5 (L) 03/03/2018 0726   HGB 11.6 (L) 02/06/2014 1628   HCT 33.7 (L) 03/03/2018 0726   HCT 35.9 (L) 02/06/2014 1628   PLT 225 03/03/2018 0726   PLT 221 02/06/2014 1628    BMET    Component Value Date/Time   NA 139 03/03/2018 0726   NA 138 02/06/2014 1628   K 4.1 03/03/2018 0726   K 4.6 02/06/2014 1628   CL 102 03/03/2018 0726   CL 102 02/06/2014 1628   CO2 29 03/03/2018 0726   CO2 30 02/06/2014 1628   GLUCOSE 200 (H) 03/03/2018 0726   GLUCOSE 139 (H) 02/06/2014 1628   BUN 14 03/03/2018 0726   BUN 31 (H) 02/06/2014 1628   CREATININE 1.05 03/03/2018 0726   CREATININE 1.16 02/06/2014 1628   CALCIUM 8.3 (L) 03/03/2018 0726   CALCIUM 8.6 02/06/2014 1628   GFRNONAA >60 03/03/2018 0726   GFRNONAA >60 02/06/2014 1628   GFRNONAA >60 12/18/2013 0435   GFRAA >60 03/03/2018 0726   GFRAA >60 02/06/2014 1628   GFRAA >60 12/18/2013 0435    Assessment/Planning: Abscess s/p I & D.    New dressing applied.  Hopeful for re-vascularization this week.  May need repeat I & D if purulence remains.  Awaiting culture and sensitivities.  Currently GPC, GNR and GPR's.   Samara Deist A  03/04/2018, 11:07 AM

## 2018-03-04 NOTE — Progress Notes (Signed)
[place] at Victor Wise NAME: Leanord Wise    MR#:  [id number]  DATE OF BIRTH:  Dec 09, 1950  SUBJECTIVE:  CHIEF COMPLAINT:   Chief Complaint  Patient presents with  . Wound Infection  . Foot Pain   - left foot wound. S/p deep I&D -doing well except significant weakness  REVIEW OF SYSTEMS:  Review of Systems  Constitutional: Negative for chills and fever.  HENT: Negative for ear pain, hearing loss and tinnitus.   Eyes: Negative for blurred vision and double vision.  Respiratory: Negative for cough, shortness of breath and wheezing.   Cardiovascular: Positive for leg swelling. Negative for chest pain and palpitations.  Gastrointestinal: Negative for abdominal pain, constipation, diarrhea, nausea and vomiting.  Genitourinary: Negative for dysuria.  Musculoskeletal: Negative for myalgias.  Skin: Positive for rash.  Neurological: Positive for weakness. Negative for dizziness, focal weakness, seizures and headaches.  Psychiatric/Behavioral: Negative for depression.    DRUG ALLERGIES:   Allergies  Allergen Reactions  . Amoxicillin Shortness Of Breath, Itching and Other (See Comments)    Has patient had a PCN reaction causing immediate rash, facial/tongue/throat swelling, SOB or lightheadedness with hypotension: Yes Has patient had a PCN reaction causing severe rash involving mucus membranes or skin necrosis: No Has patient had a PCN reaction that required hospitalization No Has patient had a PCN reaction occurring within the last 10 years: No If all of the above answers are "NO", then may proceed with Cephalosporin use.  . Other Anaphylaxis, Itching and Other (See Comments)    Pt states that he is allergic to Endopa.  Reaction:  Anaphylaxis  Pt states that he is allergic to Metabisulfites Reaction:  Itching   . Penicillins Shortness Of Breath, Itching and Other (See Comments)    Has patient had a PCN reaction causing immediate rash,  facial/tongue/throat swelling, SOB or lightheadedness with hypotension: Yes Has patient had a PCN reaction causing severe rash involving mucus membranes or skin necrosis: No Has patient had a PCN reaction that required hospitalization No Has patient had a PCN reaction occurring within the last 10 years: No If all of the above answers are "NO", then may proceed with Cephalosporin use.  [name] Hydralazine Other (See Comments)    Reaction:  Cramping of extremities  . Crestor [Rosuvastatin Calcium] Other (See Comments)    Reaction:  Pt is unable to move arms/legs   . Metoprolol Nausea And Vomiting  . Red Dye Itching  . Statins Other (See Comments)    Reaction:  Pt is unable to move arms/legs  . Sulfa Antibiotics Itching    Other reaction(s): Unknown  . Yellow Dyes (Non-Tartrazine) Itching  . Aspirin Itching and Other (See Comments)    Pt states that he is able to use in lower doses.    . Tape Rash    Please use "paper" tape only. Please use "paper" tape only.    VITALS:  Blood pressure (!) 145/75, pulse 62, temperature 98.5 F (36.9 C), temperature source Oral, resp. rate 20, height 5\' 8"  (1.727 m), weight (!) 140.6 kg, SpO2 92 %.  PHYSICAL EXAMINATION:  Physical Exam  GENERAL:  67 y.o.-year-old obese patient lying in the bed with no acute distress.  EYES: Pupils equal, round, reactive to light and accommodation. No scleral icterus. Extraocular muscles intact.  HEENT: Head atraumatic, normocephalic. Oropharynx and nasopharynx clear.  NECK:  Supple, no jugular venous distention. No thyroid enlargement, no tenderness.  LUNGS: Normal breath sounds bilaterally,  no wheezing, rales,rhonchi or crepitation. No use of accessory muscles of respiration.  Decreased bibasilar breath sounds CARDIOVASCULAR: S1, S2 normal. No murmurs, rubs, or gallops.  ABDOMEN: Soft, nontender, nondistended. Bowel sounds present. No organomegaly or mass.  EXTREMITIES: No pedal edema, cyanosis, or clubbing.  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  Left foot dressing in place. NEUROLOGIC: Cranial nerves II through XII are intact. Muscle strength 5/5 in all extremities. Sensation intact. Gait not checked.  PSYCHIATRIC: The patient is alert and oriented x 3.  SKIN: No obvious rash, lesion, or ulcer.    LABORATORY PANEL:   CBC Recent Labs  Lab 03/03/18 0726  WBC 6.4  HGB 10.5*  HCT 33.7*  PLT 225   ------------------------------------------------------------------------------------------------------------------  Chemistries  Recent Labs  Lab 03/03/18 0726  NA 139  K 4.1  CL 102  CO2 29  GLUCOSE 200*  BUN 14  CREATININE 1.05  CALCIUM 8.3*   ------------------------------------------------------------------------------------------------------------------  Cardiac Enzymes No results for input(s): TROPONINI in the last 168 hours. ------------------------------------------------------------------------------------------------------------------  RADIOLOGY:  No results found.  EKG:   Orders placed or performed during the hospital encounter of 10/26/15  . EKG 12-Lead  . EKG 12-Lead  . EKG 12-Lead in am (before 8am)  . EKG 12-Lead in am (before 8am)  . EKG    ASSESSMENT AND PLAN:   67 year old male with past medical history significant for diabetes, hypertension, chronic low back pain and CAD resents to hospital secondary to left foot diabetic ulcer  1.  Left foot cellulitis and diabetic foot ulcer-appreciate podiatry consult - left plantar 1st MTP ulcer- s/p I&D and packing done -On vancomycin and meropenem, cultures growing GPC, GNR and GPR -X-rays negative for osteomyelitis -Dressing changes per podiatry-if purulence is present, will need repeat I&D. -partial Weightbearing as tolerated -Also vascular surgery consult for possible revascularization-as poor blood flow noted during surgery. -For angiogram on Monday or Wednesday likely-we will keep n.p.o.  2.   Diabetes mellitus-sliding scale insulin.  A1c of 8 -Added januvia/tradjenta here -Metformin can be added after discharge in case he needs an angiogram and contrast exposure  3.  Chronic back pain-patient on Lyrica, Cymbalta, oxycodone and OxyContin  4.  Hypertension-benazepril and hydrochlorothiazide.  5.  DVT prophylaxis-on Lovenox  6.  Constipation-meds added- relieved  PT recommended SNF at this point.  Patient interested in going back home with home health.   All the records are reviewed and case discussed with Care Management/Social Workerr. Management plans discussed with the patient, family and they are in agreement.  CODE STATUS: Full code  TOTAL TIME TAKING CARE OF THIS PATIENT: 38 minutes.   POSSIBLE D/C IN 2 DAYS, DEPENDING ON CLINICAL CONDITION.   Taraann Olthoff M.D on 03/04/2018 at 11:48 AM  Between 7am to 6pm - Pager - [phone]  After 6pm go to [email or web address] - password [email or web address]  Office  [phone]  CC: Primary care physician; Lavera Guise, MD

## 2018-03-04 NOTE — NC FL2 (Signed)
[place] LEVEL OF CARE SCREENING TOOL     IDENTIFICATION  Patient Name: Victor Wise Birthdate: 1950-10-16 Sex: male Admission Date (Current Location): 03/01/2018  [place] and [place] Number:  Engineering geologist and Address:  [facility], [address] [place], [place] [address]      Provider Number: [id number]  Attending Physician Name and Address:  Gladstone Lighter, MD  Relative Name and Phone Number:  [name] (Daughter) [phone] or Delfin Gant (Relative) [phone]    Current Level of Care: Hospital Recommended Level of Care: [place] Prior Approval Number:    Date Approved/Denied:   PASRR Number: [id number]  Discharge Plan: SNF    Current Diagnoses: Patient Active Problem List   Diagnosis Date Noted  . Diabetic foot infection ([place]) 03/01/2018  . Hypertension 09/05/2017  . OSA on CPAP 09/05/2017  . Uncontrolled type 2 diabetes mellitus with hyperglycemia ([place]) 08/02/2017  . Peripheral vascular disease ([place]) 08/02/2017  . Mixed hyperlipidemia 08/02/2017  . Dysuria 08/02/2017  . Acute renal failure (ARF) ([place]) 02/18/2016  . Hypotension 02/18/2016  . Sick sinus syndrome ([place]) 10/28/2015  . Syncopal episodes 10/26/2015  . Syncope 10/26/2015  . Adjustment disorder with mixed anxiety and depressed mood 10/20/2015  . Dysthymia 10/20/2015  . CVA (cerebral infarction) 10/19/2015  . Urinary retention 03/25/2015  . 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 Acute MI, anterolateral wall, subsequent episode of care ([place]) 12/19/2014  . SIRS (systemic inflammatory response syndrome) (HCC) 12/18/2014    Orientation RESPIRATION BLADDER Height & Weight     Self, Time, Situation, Place  Normal Continent Weight: (!) 310 lb (140.6 kg) Height:  5\' 8"  (172.7 cm)  BEHAVIORAL SYMPTOMS/MOOD NEUROLOGICAL BOWEL NUTRITION STATUS      Continent Diet(Carb modified)  AMBULATORY STATUS COMMUNICATION OF NEEDS Skin    Extensive Assist Verbally Surgical wounds                       Personal Care Assistance Level of Assistance  Bathing, Feeding, Dressing Bathing Assistance: Limited assistance Feeding assistance: Independent Dressing Assistance: Limited assistance     Functional Limitations Info  Sight, Hearing, Speech Sight Info: Adequate Hearing Info: Adequate Speech Info: Adequate    SPECIAL CARE FACTORS FREQUENCY  PT (By licensed PT)     PT Frequency: Up to 5X per week              Contractures Contractures Info: Not present    Additional Factors Info  Code Status, Allergies, Psychotropic, Insulin Sliding Scale Code Status Info: Full Allergies Info: Amoxicillin, Other, Penicillins, Hydralazine, Crestor Rosuvastatin Calcium, Metoprolol, Red Dye, Statins, Sulfa Antibiotics, Yellow Dyes (Non-tartrazine), Aspirin, Tape Psychotropic Info: Wellbutrin, Cymbalta, Buspar Insulin Sliding Scale Info: Novolog: 0-5 units QHS and 0-9 units TID with meals       Current Medications (03/04/2018):  This is the current hospital active medication list Current Facility-Administered Medications  Medication Dose Route Frequency Provider Last Rate Last Dose  . 0.9 %  sodium chloride infusion   Intravenous PRN Samara Deist, DPM   Stopped at 03/02/18 1028  . acetaminophen (TYLENOL) tablet 650 mg  650 mg Oral Q6H PRN Samara Deist, DPM       Or  . acetaminophen (TYLENOL) suppository 650 mg  650 mg Rectal Q6H PRN Samara Deist, DPM      . aspirin EC tablet 81 mg  81 mg Oral Daily Samara Deist, DPM   81 mg at 03/04/18 1023  .  benazepril (LOTENSIN) tablet 20 mg  20 mg Oral BID Samara Deist, DPM   20 mg at 03/04/18 1026  . buPROPion (WELLBUTRIN XL) 24 hr tablet 300 mg  300 mg Oral Daily Samara Deist, DPM   300 mg at 03/02/18 1337  . busPIRone (BUSPAR) tablet 10 mg  10 mg Oral QHS Samara Deist, DPM   10 mg at 03/03/18 2139  . clopidogrel (PLAVIX) tablet 75 mg  75 mg Oral Daily Samara Deist,  DPM   75 mg at 03/04/18 1024  . docusate sodium (COLACE) capsule 100 mg  100 mg Oral BID Gladstone Lighter, MD   100 mg at 03/04/18 1022  . DULoxetine (CYMBALTA) DR capsule 30 mg  30 mg Oral QHS Samara Deist, DPM   30 mg at 03/03/18 2139  . DULoxetine (CYMBALTA) DR capsule 60 mg  60 mg Oral Daily Samara Deist, DPM   60 mg at 03/04/18 1024  . enoxaparin (LOVENOX) injection 40 mg  40 mg Subcutaneous BID Samara Deist, DPM   40 mg at 03/04/18 1027  . fenofibrate tablet 54 mg  54 mg Oral Daily Samara Deist, DPM   54 mg at 03/03/18 6767  . finasteride (PROSCAR) tablet 5 mg  5 mg Oral Daily Samara Deist, DPM   5 mg at 03/04/18 1024  . hydrochlorothiazide (HYDRODIURIL) tablet 12.5 mg  12.5 mg Oral Daily Samara Deist, DPM   12.5 mg at 03/04/18 1020  . Influenza vac split quadrivalent PF (FLUZONE HIGH-DOSE) injection 0.5 mL  0.5 mL Intramuscular Tomorrow-1000 Samara Deist, DPM      . insulin aspart (novoLOG) injection 0-5 Units  0-5 Units Subcutaneous QHS Gladstone Lighter, MD   2 Units at 03/03/18 2141  . insulin aspart (novoLOG) injection 0-9 Units  0-9 Units Subcutaneous TID WC Gladstone Lighter, MD   3 Units at 03/04/18 0755  . insulin glargine (LANTUS) injection 30 Units  30 Units Subcutaneous QHS Loney Hering D, MD   30 Units at 03/03/18 2141  . labetalol (NORMODYNE,TRANDATE) injection 10 mg  10 mg Intravenous Q4H PRN Samara Deist, DPM      . lactulose (CHRONULAC) 10 GM/15ML solution 20 g  20 g Oral Daily PRN Gladstone Lighter, MD      . linagliptin (TRADJENTA) tablet 5 mg  5 mg Oral Daily Gladstone Lighter, MD   5 mg at 03/04/18 1025  . meropenem (MERREM) 1 g in sodium chloride 0.9 % 100 mL IVPB  1 g Intravenous Q8H Samara Deist, DPM 200 mL/hr at 03/04/18 0517 1 g at 03/04/18 0517  . ondansetron (ZOFRAN) tablet 4 mg  4 mg Oral Q6H PRN Samara Deist, DPM       Or  . ondansetron [facility]) injection 4 mg  4 mg Intravenous Q6H PRN Samara Deist, DPM      . Oxcarbazepine  (TRILEPTAL) tablet 300 mg  300 mg Oral Daily Samara Deist, DPM   300 mg at 03/04/18 1027  . Oxcarbazepine (TRILEPTAL) tablet 600 mg  600 mg Oral QHS Samara Deist, DPM   600 mg at 03/03/18 2140  . oxyCODONE (Oxy IR/ROXICODONE) immediate release tablet 5 mg  5 mg Oral Q4H PRN Samara Deist, DPM   5 mg at 03/03/18 2345  . oxyCODONE (OXYCONTIN) 12 hr tablet 20 mg  20 mg Oral Q12H Samara Deist, DPM   20 mg at 03/04/18 1022  . pantoprazole (PROTONIX) EC tablet 40 mg  40 mg Oral Daily Samara Deist, DPM   40 mg at 03/04/18 1023  .  polyethylene glycol (MIRALAX / GLYCOLAX) packet 17 g  17 g Oral Daily Gladstone Lighter, MD   17 g at 03/04/18 1027  . pregabalin (LYRICA) capsule 200 mg  200 mg Oral TID Samara Deist, DPM   200 mg at 03/04/18 1022  . tamsulosin (FLOMAX) capsule 0.4 mg  0.4 mg Oral QPC supper Samara Deist, DPM   0.4 mg at 03/03/18 1720  . vancomycin (VANCOCIN) 1,250 mg in sodium chloride 0.9 % 250 mL IVPB  1,250 mg Intravenous Q8H Samara Deist, DPM 166.7 mL/hr at 03/04/18 0859 1,250 mg at 03/04/18 5670     Discharge Medications: Please see discharge summary for a list of discharge medications.  Relevant Imaging Results:  Relevant Lab Results:   Additional Information SS# [id number]  Zettie Pho, LCSW

## 2018-03-04 NOTE — Progress Notes (Signed)
Assumed pt care, pt denies any needs at this time.

## 2018-03-05 ENCOUNTER — Encounter
Admission: EM | Disposition: A | Discharge status: Skilled Nursing Facility | Payer: Self-pay | Source: Home / Self Care | Attending: Internal Medicine

## 2018-03-05 DIAGNOSIS — L089 Local infection of the skin and subcutaneous tissue, unspecified: Secondary | ICD-10-CM

## 2018-03-05 DIAGNOSIS — I70249 Atherosclerosis of native arteries of left leg with ulceration of unspecified site: Secondary | ICD-10-CM

## 2018-03-05 DIAGNOSIS — I739 Peripheral vascular disease, unspecified: Secondary | ICD-10-CM

## 2018-03-05 DIAGNOSIS — L97929 Non-pressure chronic ulcer of unspecified part of left lower leg with unspecified severity: Secondary | ICD-10-CM

## 2018-03-05 HISTORY — PX: LOWER EXTREMITY ANGIOGRAPHY: CATH118251

## 2018-03-05 LAB — GLUCOSE, CAPILLARY
GLUCOSE-CAPILLARY: 188 mg/dL — AB (ref 70–99)
GLUCOSE-CAPILLARY: 174 mg/dL — AB (ref 70–99)
Glucose-Capillary: 172 mg/dL — ABNORMAL HIGH (ref 70–99)
Glucose-Capillary: 159 mg/dL — ABNORMAL HIGH (ref 70–99)
Glucose-Capillary: 126 mg/dL — ABNORMAL HIGH (ref 70–99)

## 2018-03-05 SURGERY — LOWER EXTREMITY ANGIOGRAPHY
Anesthesia: Moderate Sedation | Laterality: Left

## 2018-03-05 MED ORDER — HEPARIN SODIUM (PORCINE) 1000 UNIT/ML IJ SOLN
INTRAMUSCULAR | Status: AC
  Filled 2018-03-05: qty 1

## 2018-03-05 MED ORDER — FENTANYL CITRATE (PF) 100 MCG/2ML IJ SOLN
INTRAMUSCULAR | Status: AC
  Filled 2018-03-05: qty 2

## 2018-03-05 MED ORDER — MIDAZOLAM HCL 2 MG/2ML IJ SOLN
INTRAMUSCULAR | Status: DC | PRN
  Administered 2018-03-05: 2 mg via INTRAVENOUS

## 2018-03-05 MED ORDER — SODIUM CHLORIDE 0.9 % IV SOLN
INTRAVENOUS | Status: DC
  Administered 2018-03-05: 14:00:00 via INTRAVENOUS

## 2018-03-05 MED ORDER — HEPARIN SODIUM (PORCINE) 1000 UNIT/ML IJ SOLN
INTRAMUSCULAR | Status: DC | PRN
  Administered 2018-03-05: 6000 [IU] via INTRAVENOUS

## 2018-03-05 MED ORDER — LIDOCAINE-EPINEPHRINE (PF) 1 %-1:200000 IJ SOLN
INTRAMUSCULAR | Status: AC
  Filled 2018-03-05: qty 30

## 2018-03-05 MED ORDER — METHOCARBAMOL 500 MG PO TABS
500.0000 mg | ORAL_TABLET | Freq: Once | ORAL | Status: AC
  Administered 2018-03-05: 500 mg via ORAL
  Filled 2018-03-05: qty 1

## 2018-03-05 MED ORDER — HEPARIN (PORCINE) IN NACL 1000-0.9 UT/500ML-% IV SOLN
INTRAVENOUS | Status: AC
  Filled 2018-03-05: qty 1000

## 2018-03-05 MED ORDER — FENTANYL CITRATE (PF) 100 MCG/2ML IJ SOLN
INTRAMUSCULAR | Status: DC | PRN
  Administered 2018-03-05: 50 ug via INTRAVENOUS

## 2018-03-05 MED ORDER — IOPAMIDOL (ISOVUE-300) INJECTION 61%
INTRAVENOUS | Status: DC | PRN
  Administered 2018-03-05: 75 mL via INTRA_ARTERIAL

## 2018-03-05 MED ORDER — MIDAZOLAM HCL 5 MG/5ML IJ SOLN
INTRAMUSCULAR | Status: AC
  Filled 2018-03-05: qty 5

## 2018-03-05 MED ORDER — VANCOMYCIN HCL 10 G IV SOLR
1500.0000 mg | INTRAVENOUS | Status: DC
  Filled 2018-03-05 (×2): qty 1500

## 2018-03-05 SURGICAL SUPPLY — 14 items
CATH BEACON 5 .038 100 VERT TP (CATHETERS) ×3 IMPLANT
CATH CXI SUPP ANG 4FR 135 (CATHETERS) ×1 IMPLANT
CATH CXI SUPP ANG 4FR 135CM (CATHETERS) ×3
CATH PIG 70CM (CATHETERS) ×3 IMPLANT
DEVICE PRESTO INFLATION (MISCELLANEOUS) IMPLANT
DEVICE STARCLOSE SE CLOSURE (Vascular Products) ×3 IMPLANT
DEVICE TORQUE .025-.038 (MISCELLANEOUS) ×3 IMPLANT
GLIDEWIRE ADV .035X260CM (WIRE) ×6 IMPLANT
PACK ANGIOGRAPHY (CUSTOM PROCEDURE TRAY) ×3 IMPLANT
SHEATH ANL2 6FRX45 HC (SHEATH) ×3 IMPLANT
SHEATH BRITE TIP 5FRX11 (SHEATH) ×3 IMPLANT
SYR MEDRAD MARK V 150ML (SYRINGE) ×3 IMPLANT
TUBING CONTRAST HIGH PRESS 72 (TUBING) ×3 IMPLANT
WIRE J 3MM .035X145CM (WIRE) ×3 IMPLANT

## 2018-03-05 NOTE — Clinical Social Work Placement (Signed)
   CLINICAL SOCIAL WORK PLACEMENT  NOTE  Date:  03/05/2018  Patient Details  Name: Victor Wise MRN: [id number] Date of Birth: 1950/11/13  Clinical Social Work is seeking post-discharge placement for this patient at the [place] level of care (*CSW will initial, date and re-position this form in  chart as items are completed):  Yes   Patient/family provided with [place] Work Department's list of facilities offering this level of care within the geographic area requested by the patient (or if unable, by the patient's family).  Yes   Patient/family informed of their freedom to choose among providers that offer the needed level of care, that participate in Medicare, Medicaid or managed care program needed by the patient, have an available bed and are willing to accept the patient.  Yes   Patient/family informed of [facility]'s ownership interest in [facility] and [facility], as well as of the fact that they are under no obligation to receive care at these facilities.  PASRR submitted to EDS on       PASRR number received on       Existing PASRR number confirmed on 03/04/18     FL2 transmitted to all facilities in geographic area requested by pt/family on 03/04/18     FL2 transmitted to all facilities within larger geographic area on       Patient informed that his/her managed care company has contracts with or will negotiate with certain facilities, including the following:        Yes   Patient/family informed of bed offers received.  Patient chooses bed at ([facility] )     Physician recommends and patient chooses bed at      Patient to be transferred to   on  .  Patient to be transferred to facility by       Patient family notified on   of transfer.  Name of family member notified:        PHYSICIAN Please sign FL2     Additional Comment:    _______________________________________________ Victor Wise, Veronia Beets, LCSW 03/05/2018,  11:32 AM

## 2018-03-05 NOTE — Progress Notes (Signed)
3 Days Post-Op   Subjective/Chief Complaint: Patient seen.  No real complaints.   Objective: Vital signs in last 24 hours: Temp:  [97.8 F (36.6 C)-98.7 F (37.1 C)] 97.8 F (36.6 C) (10/21 0758) Pulse Rate:  [61] 61 (10/21 0758) Resp:  [19-20] 20 (10/21 0758) BP: (142-181)/(71-83) 153/79 (10/21 0758) SpO2:  [95 %-97 %] 95 % (10/21 0758) Weight:  [134.2 kg] 134.2 kg (10/21 0603) Last BM Date: 03/03/18  Intake/Output from previous day: 10/20 0701 - 10/21 0700 In: 1815.2 [IV Piggyback:1815.2] Out: 485 [Urine:775] Intake/Output this shift: Total I/O In: -  Out: 700 [Urine:700]  The bandage is dry and intact.  Upon removal there is some mild drainage and purulence from the plantar and medial ulcerative areas.  Incision appears coapted but there does appear to be some early mild incisional necrosis.  No significant cellulitis in the foot.  There is visible exposed bone and cartilage of the first metatarsal through the medial ulcerative area just inferior to the incision.  Lab Results:  Recent Labs    03/03/18 0726  WBC 6.4  HGB 10.5*  HCT 33.7*  PLT 225   BMET Recent Labs    03/03/18 0726  NA 139  K 4.1  CL 102  CO2 29  GLUCOSE 200*  BUN 14  CREATININE 1.05  CALCIUM 8.3*   PT/INR No results for input(s): LABPROT, INR in the last 72 hours. ABG No results for input(s): PHART, HCO3 in the last 72 hours.  Invalid input(s): PCO2, PO2  Studies/Results: No results found.  Anti-infectives: Anti-infectives (From admission, onward)   Start     Dose/Rate Route Frequency Ordered Stop   03/02/18 0000  vancomycin (VANCOCIN) 1,250 mg in sodium chloride 0.9 % 250 mL IVPB     1,250 mg 166.7 mL/hr over 90 Minutes Intravenous Every 8 hours 03/01/18 2224     03/01/18 2245  meropenem (MERREM) 1 g in sodium chloride 0.9 % 100 mL IVPB     1 g 200 mL/hr over 30 Minutes Intravenous Every 8 hours 03/01/18 2241     03/01/18 1700  vancomycin (VANCOCIN) IVPB 1000 mg/200 mL premix      1,000 mg 200 mL/hr over 60 Minutes Intravenous  Once 03/01/18 1659 03/01/18 1956      Assessment/Plan: s/p Procedure(s): IRRIGATION AND DEBRIDEMENT FOOT (Left) Assessment: Abscess left foot status post debridement   Plan: Saline wet-to-dry dressing applied to the left foot.  Discussed with the patient that at this point we are waiting for his vascular evaluation.  Discussed that he will most likely need another debridement and discussed the possibility of having to take some of the bone and the joint out to facilitate better healing.  We will continue to monitor until asked her his angiogram and then make a decision on timing for debridement.  LOS: 4 days    Durward Fortes 03/05/2018

## 2018-03-05 NOTE — Progress Notes (Signed)
With continued encouragement from other staff, pt allowed staff to change his soiled linens.  Pt tolerated procedure well.

## 2018-03-05 NOTE — Op Note (Signed)
[facility]  Percutaneous Study/Intervention Procedural Note   Date of Surgery: 03/05/2018  Surgeon(s):Iliya Spivack    Assistants:none  Pre-operative Diagnosis: PAD with ulceration and infection LLE  Post-operative diagnosis:  Same  Procedure(s) Performed:             1.  Ultrasound guidance for vascular access right femoral artery             2.  Catheter placement into left SFA from right femoral approach             3.  Aortogram and selective left lower extremity angiogram             4.  StarClose closure device right femoral artery  EBL: 15 cc  Contrast: 75 cc  Fluoro Time: 16 minutes  Moderate Conscious Sedation Time: approximately 45 minutes using 2 mg of Versed and 50 mcg of Fentanyl              Indications:  Patient is a 67 y.o.male with ulceration and infection of the left foot. The patient is brought in for angiography for further evaluation and potential treatment.  Due to the limb threatening nature of the situation, angiogram was performed for attempted limb salvage. The patient is aware that if the procedure fails, amputation would be expected.  The patient also understands that even with successful revascularization, amputation may still be required due to the severity of the situation.  Risks and benefits are discussed and informed consent is obtained.   Procedure:  The patient was identified and appropriate procedural time out was performed.  The patient was then placed supine on the table and prepped and draped in the usual sterile fashion. Moderate conscious sedation was administered during a face to face encounter with the patient throughout the procedure with my supervision of the RN administering medicines and monitoring the patient's vital signs, pulse oximetry, telemetry and mental status throughout from the start of the procedure until the patient was taken to the recovery room. Ultrasound was used to evaluate the right common femoral  artery.  It was patent .  A digital ultrasound image was acquired.  A Seldinger needle was used to access the right common femoral artery under direct ultrasound guidance and a permanent image was performed.  A 0.035 J wire was advanced without resistance and a 5Fr sheath was placed.  Pigtail catheter was placed into the aorta and an AP aortogram was performed. This demonstrated normal renal arteries and normal aorta and iliac segments without significant stenosis. I then crossed the aortic bifurcation and advanced to the left femoral head. Selective left lower extremity angiogram was then performed. This demonstrated occlusion of the superficial femoral artery about 3 to 4 cm beyond its origin with a normal common femoral artery and profunda femoris artery.  There was no significant reconstitution throughout the entire SFA or popliteal artery.  All 3 tibial vessels were also occluded proximally with the only reconstitution being in the distal posterior tibial artery in the mid to distal calf.  There was faint visualization of what might have been a peroneal artery although that was not entirely clear.  No anterior tibial artery was seen. The patient was systemically heparinized and a 6 [place] Ansell sheath was then placed over the [employer] wire. I then used a Kumpe catheter and the advantage wire to navigate down into the superficial femoral artery.  We advanced through the proximal mid superficial femoral artery down to the distal superficial  femoral artery and the above-knee popliteal artery.  Despite multiple attempts with the Kumpe catheter and the advantage wire we were never unable to cross the occlusion in several channels of subintimal flow were seen.  After over 15 minutes of fluoroscopy having no real visualization for where the actual vessel was I elected to terminate the procedure today.  He was very generous in the popliteal artery with the calcification shadow of the vessel proximally, and I  question whether or not there could be a popliteal artery aneurysm as well.  I felt at this point another procedure potentially coming from pedal access may be required and percutaneous revascularization may not be an option given the extremely long segment occlusion. I elected to terminate the procedure. The sheath was removed and StarClose closure device was deployed in the right femoral artery with excellent hemostatic result. The patient was taken to the recovery room in stable condition having tolerated the procedure well.  Findings:               Aortogram:  normal renal arteries, normal aorta and iliac arteries without significant stenosis             left Lower Extremity:  This demonstrated occlusion of the superficial femoral artery about 3 to 4 cm beyond its origin with a normal common femoral artery and profunda femoris artery.  There was no significant reconstitution throughout the entire SFA or popliteal artery.  All 3 tibial vessels were also occluded proximally with the only reconstitution being in the distal posterior tibial artery in the mid to distal calf.  There was faint visualization of what might have been a peroneal artery although that was not entirely clear.  No anterior tibial artery was seen   Disposition: Patient was taken to the recovery room in stable condition having tolerated the procedure well.  Complications: None  Leotis Pain 03/05/2018 4:08 PM   This note was created with Dragon Medical transcription system. Any errors in dictation are purely unintentional.

## 2018-03-05 NOTE — H&P (Signed)
[facility] History & Physical Update  The patient was interviewed and re-examined.  The patient's previous History and Physical has been reviewed and is unchanged.  There is no change in the plan of care. We plan to proceed with the scheduled procedure.  Leotis Pain, MD  03/05/2018, 2:39 PM

## 2018-03-05 NOTE — Care Management Important Message (Signed)
Important Message  Patient Details  Name: Victor Wise MRN: [id number] Date of Birth: 29-Sep-1950   Medicare Important Message Given:  Yes    Juliann Pulse A Britteny Fiebelkorn 03/05/2018, 11:10 AM

## 2018-03-05 NOTE — Progress Notes (Signed)
Pt requested a urinal, and one was given to him by this nurse.  This nurse offered to assist pt, but pt declined.  After pt completed using urinal, he states that he made a mess.  This nurse asked the pt if he needed any sheets changed, but pt stated he would wait until he got back to this room.  This nurse offered several times but pt continued to refuse.

## 2018-03-05 NOTE — Progress Notes (Signed)
Clinical Education officer, museum (CSW) met with patient and presented bed offers. He chose [facility]. Per Otila Kluver [facility] liaison she will start [facility] authorization today. CSW will continue to follow and assist as needed.   [employer], LCSW [phone]

## 2018-03-05 NOTE — Progress Notes (Signed)
[facility] NAME: Victor Wise    MR#:  [id number]  DATE OF BIRTH:  [birth year]  SUBJECTIVE:  CHIEF COMPLAINT:   Chief Complaint  Patient presents with  . Wound Infection  . Foot Pain   - left foot wound. S/p deep I&D -for angiogram today - might need repeat debridement per podiatry  REVIEW OF SYSTEMS:  Review of Systems  Constitutional: Negative for chills and fever.  HENT: Negative for ear pain, hearing loss and tinnitus.   Eyes: Negative for blurred vision and double vision.  Respiratory: Negative for cough, shortness of breath and wheezing.   Cardiovascular: Positive for leg swelling. Negative for chest pain and palpitations.  Gastrointestinal: Negative for abdominal pain, constipation, diarrhea, nausea and vomiting.  Genitourinary: Negative for dysuria.  Musculoskeletal: Negative for myalgias.  Skin: Negative for rash.  Neurological: Positive for weakness. Negative for dizziness, focal weakness, seizures and headaches.  Psychiatric/Behavioral: Negative for depression.    DRUG ALLERGIES:   Allergies  Allergen Reactions  . Amoxicillin Shortness Of Breath, Itching and Other (See Comments)    Has patient had a PCN reaction causing immediate rash, facial/tongue/throat swelling, SOB or lightheadedness with hypotension: Yes Has patient had a PCN reaction causing severe rash involving mucus membranes or skin necrosis: No Has patient had a PCN reaction that required hospitalization No Has patient had a PCN reaction occurring within the last 10 years: No If all of the above answers are "NO", then may proceed with Cephalosporin use.  . Other Anaphylaxis, Itching and Other (See Comments)    Pt states that he is allergic to Endopa.  Reaction:  Anaphylaxis  Pt states that he is allergic to Metabisulfites Reaction:  Itching   . Penicillins Shortness Of Breath, Itching and Other (See Comments)    Has patient had a PCN reaction  causing immediate rash, facial/tongue/throat swelling, SOB or lightheadedness with hypotension: Yes Has patient had a PCN reaction causing severe rash involving mucus membranes or skin necrosis: No Has patient had a PCN reaction that required hospitalization No Has patient had a PCN reaction occurring within the last 10 years: No If all of the above answers are "NO", then may proceed with Cephalosporin use.  [name] Hydralazine Other (See Comments)    Reaction:  Cramping of extremities  . Crestor [Rosuvastatin Calcium] Other (See Comments)    Reaction:  Pt is unable to move arms/legs   . Metoprolol Nausea And Vomiting  . Red Dye Itching  . Statins Other (See Comments)    Reaction:  Pt is unable to move arms/legs  . Sulfa Antibiotics Itching    Other reaction(s): Unknown  . Yellow Dyes (Non-Tartrazine) Itching  . Aspirin Itching and Other (See Comments)    Pt states that he is able to use in lower doses.    . Tape Rash    Please use "paper" tape only. Please use "paper" tape only.    VITALS:  Blood pressure (!) 153/79, pulse 61, temperature 97.8 F (36.6 C), temperature source Oral, resp. rate 20, height 5\' 8"  (1.727 m), weight 134.2 kg, SpO2 95 %.  PHYSICAL EXAMINATION:  Physical Exam  GENERAL:  67 y.o.-year-old obese patient lying in the bed with no acute distress.  EYES: Pupils equal, round, reactive to light and accommodation. No scleral icterus. Extraocular muscles intact.  HEENT: Head atraumatic, normocephalic. Oropharynx and nasopharynx clear.  NECK:  Supple, no jugular venous distention. No thyroid enlargement, no tenderness.  LUNGS:  Normal breath sounds bilaterally, no wheezing, rales,rhonchi or crepitation. No use of accessory muscles of respiration.  Decreased bibasilar breath sounds CARDIOVASCULAR: S1, S2 normal. No murmurs, rubs, or gallops.  ABDOMEN: Soft, nontender, nondistended. Bowel sounds present. No organomegaly or mass.  EXTREMITIES: No pedal edema, cyanosis, or  clubbing.  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   - Left foot dressing in place. NEUROLOGIC: Cranial nerves II through XII are intact. Muscle strength 5/5 in all extremities. Sensation intact. Gait not checked.  PSYCHIATRIC: The patient is alert and oriented x 3.  SKIN: No obvious rash, lesion, or ulcer.    LABORATORY PANEL:   CBC Recent Labs  Lab 03/03/18 0726  WBC 6.4  HGB 10.5*  HCT 33.7*  PLT 225   ------------------------------------------------------------------------------------------------------------------  Chemistries  Recent Labs  Lab 03/03/18 0726  NA 139  K 4.1  CL 102  CO2 29  GLUCOSE 200*  BUN 14  CREATININE 1.05  CALCIUM 8.3*   ------------------------------------------------------------------------------------------------------------------  Cardiac Enzymes No results for input(s): TROPONINI in the last 168 hours. ------------------------------------------------------------------------------------------------------------------  RADIOLOGY:  No results found.  EKG:   Orders placed or performed during the hospital encounter of 10/26/15  . EKG 12-Lead  . EKG 12-Lead  . EKG 12-Lead in am (before 8am)  . EKG 12-Lead in am (before 8am)  . EKG    ASSESSMENT AND PLAN:   67 year old male with past medical history significant for diabetes, hypertension, chronic low back pain and CAD resents to hospital secondary to left foot diabetic ulcer  1.  Left foot cellulitis and diabetic foot ulcer-appreciate podiatry consult - left plantar 1st MTP ulcer- s/p I&D and packing done -On vancomycin and meropenem, cultures growing GPC, GNR and GPR -X-rays negative for osteomyelitis -Dressing changes per podiatry- looks like patient might need repeat debridement -partial Weightbearing as tolerated -Also vascular surgery consulted for revascularization-as poor blood flow noted during surgery. -For angiogram today  2.  Diabetes mellitus-sliding  scale insulin.  A1c of 8 -Added januvia/tradjenta here -Metformin can be added after discharge as he needs an angiogram and contrast exposure  3.  Chronic back pain-patient on Lyrica, Cymbalta, oxycodone and OxyContin  4.  Hypertension-benazepril and hydrochlorothiazide.  5.  DVT prophylaxis-on Lovenox  6.  Constipation-meds added- relieved  PT recommended SNF at this point.   To SNF after podiatry debridement   All the records are reviewed and case discussed with Care Management/Social Workerr. Management plans discussed with the patient, family and they are in agreement.  CODE STATUS: Full code  TOTAL TIME TAKING CARE OF THIS PATIENT: 36 minutes.   POSSIBLE D/C IN 2 DAYS, DEPENDING ON CLINICAL CONDITION.   Braylei Totino M.D on 03/05/2018 at 1:50 PM  Between 7am to 6pm - Pager - [phone]  After 6pm go to [email or web address] - password [email or web address] [facility]  Office  [phone]  CC: Primary care physician; Lavera Guise, MD

## 2018-03-06 ENCOUNTER — Encounter: Payer: Self-pay | Admitting: Vascular Surgery

## 2018-03-06 DIAGNOSIS — B9620 Unspecified Escherichia coli [E. coli] as the cause of diseases classified elsewhere: Secondary | ICD-10-CM

## 2018-03-06 DIAGNOSIS — M549 Dorsalgia, unspecified: Secondary | ICD-10-CM

## 2018-03-06 DIAGNOSIS — B966 Bacteroides fragilis [B. fragilis] as the cause of diseases classified elsewhere: Secondary | ICD-10-CM

## 2018-03-06 DIAGNOSIS — Z880 Allergy status to penicillin: Secondary | ICD-10-CM

## 2018-03-06 DIAGNOSIS — Z9682 Presence of neurostimulator: Secondary | ICD-10-CM

## 2018-03-06 DIAGNOSIS — L02612 Cutaneous abscess of left foot: Secondary | ICD-10-CM

## 2018-03-06 DIAGNOSIS — I252 Old myocardial infarction: Secondary | ICD-10-CM

## 2018-03-06 DIAGNOSIS — Z89411 Acquired absence of right great toe: Secondary | ICD-10-CM

## 2018-03-06 DIAGNOSIS — F419 Anxiety disorder, unspecified: Secondary | ICD-10-CM

## 2018-03-06 DIAGNOSIS — Z886 Allergy status to analgesic agent status: Secondary | ICD-10-CM

## 2018-03-06 DIAGNOSIS — E1151 Type 2 diabetes mellitus with diabetic peripheral angiopathy without gangrene: Secondary | ICD-10-CM

## 2018-03-06 DIAGNOSIS — Z950 Presence of cardiac pacemaker: Secondary | ICD-10-CM

## 2018-03-06 DIAGNOSIS — L97529 Non-pressure chronic ulcer of other part of left foot with unspecified severity: Secondary | ICD-10-CM

## 2018-03-06 DIAGNOSIS — Z888 Allergy status to other drugs, medicaments and biological substances status: Secondary | ICD-10-CM

## 2018-03-06 DIAGNOSIS — I70202 Unspecified atherosclerosis of native arteries of extremities, left leg: Secondary | ICD-10-CM

## 2018-03-06 DIAGNOSIS — Z91048 Other nonmedicinal substance allergy status: Secondary | ICD-10-CM

## 2018-03-06 DIAGNOSIS — B952 Enterococcus as the cause of diseases classified elsewhere: Secondary | ICD-10-CM

## 2018-03-06 DIAGNOSIS — N400 Enlarged prostate without lower urinary tract symptoms: Secondary | ICD-10-CM

## 2018-03-06 DIAGNOSIS — I2510 Atherosclerotic heart disease of native coronary artery without angina pectoris: Secondary | ICD-10-CM

## 2018-03-06 DIAGNOSIS — F329 Major depressive disorder, single episode, unspecified: Secondary | ICD-10-CM

## 2018-03-06 DIAGNOSIS — Z7902 Long term (current) use of antithrombotics/antiplatelets: Secondary | ICD-10-CM

## 2018-03-06 DIAGNOSIS — Z955 Presence of coronary angioplasty implant and graft: Secondary | ICD-10-CM

## 2018-03-06 DIAGNOSIS — Z882 Allergy status to sulfonamides status: Secondary | ICD-10-CM

## 2018-03-06 DIAGNOSIS — Z881 Allergy status to other antibiotic agents status: Secondary | ICD-10-CM

## 2018-03-06 DIAGNOSIS — G8929 Other chronic pain: Secondary | ICD-10-CM

## 2018-03-06 DIAGNOSIS — E11621 Type 2 diabetes mellitus with foot ulcer: Principal | ICD-10-CM

## 2018-03-06 DIAGNOSIS — Z7982 Long term (current) use of aspirin: Secondary | ICD-10-CM

## 2018-03-06 LAB — BASIC METABOLIC PANEL
ANION GAP: 5 (ref 5–15)
BUN: 19 mg/dL (ref 8–23)
CO2: 30 mmol/L (ref 22–32)
Calcium: 8.4 mg/dL — ABNORMAL LOW (ref 8.9–10.3)
Chloride: 106 mmol/L (ref 98–111)
Creatinine, Ser: 0.86 mg/dL (ref 0.61–1.24)
GFR calc non Af Amer: >60 mL/min (ref >60)
GLUCOSE: 206 mg/dL — AB (ref 70–99)
POTASSIUM: 3.8 mmol/L (ref 3.5–5.1)
Sodium: 141 mmol/L (ref 135–145)

## 2018-03-06 LAB — GLUCOSE, CAPILLARY
GLUCOSE-CAPILLARY: 170 mg/dL — AB (ref 70–99)
Glucose-Capillary: 173 mg/dL — ABNORMAL HIGH (ref 70–99)
Glucose-Capillary: 175 mg/dL — ABNORMAL HIGH (ref 70–99)
Glucose-Capillary: 180 mg/dL — ABNORMAL HIGH (ref 70–99)

## 2018-03-06 LAB — SURGICAL PATHOLOGY

## 2018-03-06 LAB — CULTURE, BLOOD (ROUTINE X 2)
CULTURE: NO GROWTH
CULTURE: NO GROWTH
Special Requests: ADEQUATE

## 2018-03-06 MED ORDER — INSULIN GLARGINE 100 UNIT/ML ~~LOC~~ SOLN
36.0000 [IU] | Freq: Every day | SUBCUTANEOUS | Status: DC
  Administered 2018-03-06 – 2018-03-12 (×6): 36 [IU] via SUBCUTANEOUS
  Filled 2018-03-06 (×8): qty 0.36

## 2018-03-06 MED ORDER — METHOCARBAMOL 500 MG PO TABS
500.0000 mg | ORAL_TABLET | Freq: Three times a day (TID) | ORAL | Status: DC | PRN
  Administered 2018-03-06 – 2018-03-13 (×12): 500 mg via ORAL
  Filled 2018-03-06 (×13): qty 1

## 2018-03-06 NOTE — Progress Notes (Signed)
Patient declined CPAP 

## 2018-03-06 NOTE — Progress Notes (Signed)
Per [facility] liaison [facility] SNF authorization has been received and is good for 72 hours. Per Tammy if patient does not D/C to [facility] by Thursday 03/08/18 then a new SNF authorization will need to be started on Friday 03/09/18. Patient is aware of above. Per patient he has his own cpap he can bring to [facility].   [employer], LCSW [phone]

## 2018-03-06 NOTE — Progress Notes (Signed)
SNF Benefits:   Number called: [id number] Rep: Antony Haste Reference Number: [id number]  [facility] Medicare Replacement  plan active as of 05/16/17 with no deductible.  Out of pocket max is $3400, $278.07 met as of time of call.     In-network SNF: $0 copay for days 1-20 and a $172 daily copay for days 21-100.  Once out of pocket is reached, patient covered at 100% for remainder of 100 day benefit period. Per rep in claims department,  All 100 days currently available.  Josem Kaufmann is required: [phone].

## 2018-03-06 NOTE — Consult Note (Signed)
NAME: Victor Wise  DOB: Dec 02, 1950  MRN: [id number]  Date/Time: 03/06/2018 8:15 PM  Victor Wise Subjective:  REASON FOR CONSULT: Diabetic foot ulcer     Victor Wise is a 67 y.o. male with a history of diabetes mellitus, chronic back pain, spine stimulator in place Pacemaker,BPH was admitted to the hospital with left foot wound on 03/01/2018.  According to the patient has had chronic ulceration to the bottom of his left foot for a few weeks.  He has been applying topical ointment and was doing local wound care..  But for the past 2 to 3 days ulceration has become painful and the foot a has become red and swollen.  He also noted a foul odor from the wound. He did not note any temperature. He had some diarrhea and vomiting for a few days He has a history of right great toe amputation 3 years ago. On admission his temperature was 97.8, BP of 153/69, heart rate of 61.  WBC was 9.1, Hb 12.3, PLT 276.  Creatinine was 0.91.  Blood cultures were sent.  He had a x-ray of the left foot which showed ulceration of the ball of the foot underlying the MTP joint of the great toe.  Air and gas in the soft tissue was seen.  He was started on broad-spectrum antibiotics with vancomycin and meropenem.  He underwent surgery by podiatrist on 03/02/2018.  He had excision of the infected sesamoid plantar left first MTP joint and drainage of the abscess.  Cultures were taken from the surgical site and it revealed enterococcus faecalis along with moderate Bacteroides vulgatus and E. coli. He was seen by vascular surgeon and had a aortogram and and she of the left lower extremity.  This demonstrated occlusion of the superficial femoral artery and all the 3 tibial vessels were also occluded proximally.  No angioplasty was possible.  Percutaneous revascularization was aborted. I am asked to see him for antibiotic recommendation as he has penicillin allergy.  Medical history includes the following Hypertension Diastolic  dysfunction Hyperlipidemia CAD/MI PCI and stent placement of LAD and right coronary artery in 2016. Pacemaker Rotator cuff tear Sleep apnea Stroke Renal stones Osteoarthritis of knees Chronic back pain  Past surgical history Spinal cord stimulator implant Sinus polyp removal Left knee surgery Cholecystectomy Tonsillectomy Rotator cuff tear repair Glenohumeral synovectomy Left shoulder arthroscopy Right shoulder subacromial decompression PCI with cardiac stent Right great toe amputation   Social history Former smoker No alcohol  Family History  Problem Relation Age of Onset  . Breast cancer Mother   . Hypertension Mother   . Lung cancer Father   . Hypertension Father   . Heart disease Father   . Kidney disease Sister    Allergies  Allergen Reactions  . Amoxicillin Shortness Of Breath, Itching and Other (See Comments)    Has patient had a PCN reaction causing immediate rash, facial/tongue/throat swelling, SOB or lightheadedness with hypotension: Yes Has patient had a PCN reaction causing severe rash involving mucus membranes or skin necrosis: No Has patient had a PCN reaction that required hospitalization No Has patient had a PCN reaction occurring within the last 10 years: No If all of the above answers are "NO", then may proceed with Cephalosporin use.  . Other Anaphylaxis, Itching and Other (See Comments)    Pt states that he is allergic to Endopa.  Reaction:  Anaphylaxis  Pt states that he is allergic to Metabisulfites Reaction:  Itching   . Penicillins Shortness Of Breath,  Itching and Other (See Comments)    Has patient had a PCN reaction causing immediate rash, facial/tongue/throat swelling, SOB or lightheadedness with hypotension: Yes Has patient had a PCN reaction causing severe rash involving mucus membranes or skin necrosis: No Has patient had a PCN reaction that required hospitalization No Has patient had a PCN reaction occurring within the last 10 years:  No If all of the above answers are "NO", then may proceed with Cephalosporin use.  [name] Hydralazine Other (See Comments)    Reaction:  Cramping of extremities  . Crestor [Rosuvastatin Calcium] Other (See Comments)    Reaction:  Pt is unable to move arms/legs   . Metoprolol Nausea And Vomiting  . Red Dye Itching  . Statins Other (See Comments)    Reaction:  Pt is unable to move arms/legs  . Sulfa Antibiotics Itching    Other reaction(s): Unknown  . Yellow Dyes (Non-Tartrazine) Itching  . Aspirin Itching and Other (See Comments)    Pt states that he is able to use in lower doses.    . Tape Rash    Please use "paper" tape only. Please use "paper" tape only.   Home medication NovoLog Benzapril Clopidogrel Aspirin Duloxetine Fenofibrate Lantus Trileptal Pantoprazole Lovenox Pregabalin Tamsulosin ?   Abtx:  Anti-infectives (From admission, onward)   Start     Dose/Rate Route Frequency Ordered Stop   03/05/18 1316  vancomycin (VANCOCIN) 1,500 mg in sodium chloride 0.9 % 500 mL IVPB  Status:  Discontinued     1,500 mg 250 mL/hr over 120 Minutes Intravenous 120 min pre-op 03/05/18 1316 03/05/18 1648   03/02/18 0000  vancomycin (VANCOCIN) 1,250 mg in sodium chloride 0.9 % 250 mL IVPB     1,250 mg 166.7 mL/hr over 90 Minutes Intravenous Every 8 hours 03/01/18 2224     03/01/18 2245  meropenem (MERREM) 1 g in sodium chloride 0.9 % 100 mL IVPB     1 g 200 mL/hr over 30 Minutes Intravenous Every 8 hours 03/01/18 2241     03/01/18 1700  vancomycin (VANCOCIN) IVPB 1000 mg/200 mL premix     1,000 mg 200 mL/hr over 60 Minutes Intravenous  Once 03/01/18 1659 03/01/18 1956      REVIEW OF SYSTEMS:  Const: negative fever, negative chills, negative weight loss Eyes: negative diplopia or visual changes, negative eye pain ENT: negative coryza, negative sore throat Resp: negative cough, hemoptysis, dyspnea Cards: negative for chest pain, palpitations, lower extremity edema GU: negative  for frequency, dysuria and hematuria GI: Negative for abdominal pain, and diarrhea ,no bleeding, constipation Skin: negative for rash and pruritus Heme: negative for easy bruising and gum/nose bleeding MS:  arthralgias, back pain and muscle weakness Complains of severe pain in the left calf Neurolo:negative for headaches, dizziness, vertigo, memory problems  Psych:anxiety, depression  Endocrine: Has diabetes Allergy/Immunology-has multiple medication allergies ?  Objective:  VITALS:  BP (!) 143/63 (BP Location: Left Arm)   Pulse 60   Temp 98.3 F (36.8 C) (Oral)   Resp 18   Ht 5\' 8"  (1.727 m)   Wt 134.2 kg   SpO2 95%   BMI 44.98 kg/m  PHYSICAL EXAM:  General: Alert, cooperative, no distress, appears stated age.  Obese, pale Head: Normocephalic, without obvious abnormality, atraumatic. Eyes: Conjunctivae clear, anicteric sclerae. Pupils are equal ENT Nares normal. No drainage or sinus tenderness. Lips, mucosa, and tongue normal. No Thrush Neck: Supple, symmetrical, no adenopathy, thyroid: non tender no carotid bruit and no JVD. Back: Not  examined Lungs: Bilateral air entry. Heart: S1-S2, pacemaker site clean Abdomen: Soft, obese. Bowel sounds normal. No masses Extremities: Right great toe amputated Right second toe hammertoe with some bruising Left foot: On the plantar aspect near the first metatarsal joint there is a surgical wound with tendon exposed there is some erythema around and swelling.  On the dorsal aspect of the foot there is an area of erythema     Tips of the toes of the first second third has superficial eschar Absent dorsalis pedis pulse on the left Left calf mildly swollen and tender but not erythematous lymph: Cervical, supraclavicular normal. Neurologic: Not examined in detail   pertinent Labs CBC Latest Ref Rng & Units 03/03/2018 03/02/2018 03/01/2018  WBC 4.0 - 10.5 K/uL 6.4 7.2 9.1  Hemoglobin 13.0 - 17.0 g/dL 10.5(L) 10.7(L) 12.3(L)    Hematocrit 39.0 - 52.0 % 33.7(L) 33.8(L) 39.1  Platelets 150 - 400 K/uL 225 262 276   CMP Latest Ref Rng & Units 03/06/2018 03/03/2018 03/02/2018  Glucose 70 - 99 mg/dL 206(H) 200(H) 158(H)  BUN 8 - 23 mg/dL 19 14 14   Creatinine 0.61 - 1.24 mg/dL 0.86 1.05 0.86  Sodium 135 - 145 mmol/L 141 139 139  Potassium 3.5 - 5.1 mmol/L 3.8 4.1 3.7  Chloride 98 - 111 mmol/L 106 102 102  CO2 22 - 32 mmol/L 30 29 28   Calcium 8.9 - 10.3 mg/dL 8.4(L) 8.3(L) 8.5(L)  Total Protein 6.5 - 8.1 g/dL - - -  Total Bilirubin 0.3 - 1.2 mg/dL - - -  Alkaline Phos 38 - 126 U/L - - -  AST 15 - 41 U/L - - -  ALT 17 - 63 U/L - - -    IMAGING RESULTS: ? Impression/Recommendation ?66 y.o. male with a history of diabetes mellitus, chronic back pain, spine stimulator in place Pacemaker,BPH was admitted to the hospital with left foot wound on 03/01/2018.  According to the patient has had chronic ulceration to the bottom of his left foot for a few weeks.  He has been applying topical ointment and was doing local wound care..  But for the past 2 to 3 days ulceration has become painful and the foot a has become red and swollen.  He also noted a foul odor from the wound.   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  S/Pexcision of the infected sesamoid plantar left first MTP joint and drainage of the abscess.  Polymicrobial infection with E. coli, enterococcus and Bacteroides in the culture.  Currently on vancomycin and meropenem.  Can change the meropenem to ceftriaxone and Flagyl.  History of penicillin allergy.  Also said that he was tested for amoxicillin and and was proven to be positive.  No anaphylaxis. So we can give cephalosporin. ? Peripheral arterial disease left side with occlusion of the superficial femoral artery and all 3 tibial vessels was not amenable for percutaneous revascularization. After revascularization he may go for further surgery of the left foot History of CAD with with MI.  H/o stents and on  Plavix and aspirin  Spine stimulator in place for back pain  Pacemaker in place.  __________________________________________________ Discussed with patient in detail.

## 2018-03-06 NOTE — Progress Notes (Addendum)
[facility] [place] NAME: Victor Wise    MR#:  [id number]  DATE OF BIRTH:  May 05, 1951  SUBJECTIVE:  CHIEF COMPLAINT:   Chief Complaint  Patient presents with  . Wound Infection  . Foot Pain   - left foot wound. S/p deep I&D -Had angiogram done yesterday, significant occlusions noted, repeat angiogram needed-scheduled for Thursday.  Will need to repeat for debridement after his vascularization is successful. -Patient complains of significant left calf pain since his angiogram yesterday.  REVIEW OF SYSTEMS:  Review of Systems  Constitutional: Negative for chills and fever.  HENT: Negative for ear pain, hearing loss and tinnitus.   Eyes: Negative for blurred vision and double vision.  Respiratory: Negative for cough, shortness of breath and wheezing.   Cardiovascular: Positive for leg swelling. Negative for chest pain and palpitations.  Gastrointestinal: Negative for abdominal pain, constipation, diarrhea, nausea and vomiting.  Genitourinary: Negative for dysuria.  Musculoskeletal: Negative for myalgias.  Skin: Negative for rash.  Neurological: Positive for weakness. Negative for dizziness, focal weakness, seizures and headaches.  Psychiatric/Behavioral: Negative for depression.    DRUG ALLERGIES:   Allergies  Allergen Reactions  . Amoxicillin Shortness Of Breath, Itching and Other (See Comments)    Has patient had a PCN reaction causing immediate rash, facial/tongue/throat swelling, SOB or lightheadedness with hypotension: Yes Has patient had a PCN reaction causing severe rash involving mucus membranes or skin necrosis: No Has patient had a PCN reaction that required hospitalization No Has patient had a PCN reaction occurring within the last 10 years: No If all of the above answers are "NO", then may proceed with Cephalosporin use.  . Other Anaphylaxis, Itching and Other (See Comments)    Pt states that he is allergic to Endopa.   Reaction:  Anaphylaxis  Pt states that he is allergic to Metabisulfites Reaction:  Itching   . Penicillins Shortness Of Breath, Itching and Other (See Comments)    Has patient had a PCN reaction causing immediate rash, facial/tongue/throat swelling, SOB or lightheadedness with hypotension: Yes Has patient had a PCN reaction causing severe rash involving mucus membranes or skin necrosis: No Has patient had a PCN reaction that required hospitalization No Has patient had a PCN reaction occurring within the last 10 years: No If all of the above answers are "NO", then may proceed with Cephalosporin use.  [name] Hydralazine Other (See Comments)    Reaction:  Cramping of extremities  . Crestor [Rosuvastatin Calcium] Other (See Comments)    Reaction:  Pt is unable to move arms/legs   . Metoprolol Nausea And Vomiting  . Red Dye Itching  . Statins Other (See Comments)    Reaction:  Pt is unable to move arms/legs  . Sulfa Antibiotics Itching    Other reaction(s): Unknown  . Yellow Dyes (Non-Tartrazine) Itching  . Aspirin Itching and Other (See Comments)    Pt states that he is able to use in lower doses.    . Tape Rash    Please use "paper" tape only. Please use "paper" tape only.    VITALS:  Blood pressure (!) 153/77, pulse 60, temperature (!) 97.5 F (36.4 C), temperature source Oral, resp. rate 18, height 5\' 8"  (1.727 m), weight 134.2 kg, SpO2 99 %.  PHYSICAL EXAMINATION:  Physical Exam  GENERAL:  67 y.o.-year-old obese patient lying in the bed with no acute distress.  EYES: Pupils equal, round, reactive to light and accommodation. No scleral icterus. Extraocular  muscles intact.  HEENT: Head atraumatic, normocephalic. Oropharynx and nasopharynx clear.  NECK:  Supple, no jugular venous distention. No thyroid enlargement, no tenderness.  LUNGS: Normal breath sounds bilaterally, no wheezing, rales,rhonchi or crepitation. No use of accessory muscles of respiration.  Decreased bibasilar breath  sounds CARDIOVASCULAR: S1, S2 normal. No murmurs, rubs, or gallops.  ABDOMEN: Soft, nontender, nondistended. Bowel sounds present. No organomegaly or mass.  EXTREMITIES: No pedal edema, cyanosis, or clubbing.  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   - Left foot dressing in place. NEUROLOGIC: Cranial nerves II through XII are intact. Muscle strength 5/5 in all extremities. Sensation intact. Gait not checked.  PSYCHIATRIC: The patient is alert and oriented x 3.  SKIN: No obvious rash, lesion, or ulcer.    LABORATORY PANEL:   CBC Recent Labs  Lab 03/03/18 0726  WBC 6.4  HGB 10.5*  HCT 33.7*  PLT 225   ------------------------------------------------------------------------------------------------------------------  Chemistries  Recent Labs  Lab 03/06/18 0411  NA 141  K 3.8  CL 106  CO2 30  GLUCOSE 206*  BUN 19  CREATININE 0.86  CALCIUM 8.4*   ------------------------------------------------------------------------------------------------------------------  Cardiac Enzymes No results for input(s): TROPONINI in the last 168 hours. ------------------------------------------------------------------------------------------------------------------  RADIOLOGY:  No results found.  EKG:   Orders placed or performed during the hospital encounter of 10/26/15  . EKG 12-Lead  . EKG 12-Lead  . EKG 12-Lead in am (before 8am)  . EKG 12-Lead in am (before 8am)  . EKG    ASSESSMENT AND PLAN:   67 year old male with past medical history significant for diabetes, hypertension, chronic low back pain and CAD resents to hospital secondary to left foot diabetic ulcer  1.  Left foot cellulitis and diabetic foot ulcer-appreciate podiatry consult - left plantar 1st MTP ulcer- s/p I&D and packing done -On vancomycin and meropenem, cultures growing from wound growing enterococcus and E. coli. -Change IV antibiotics to oral based on sensitivities. =-X-rays negative for  osteomyelitis -Vernard Gambles will need repeat debridement of the wound after vascular flow is established -partial Weightbearing as tolerated -Also vascular surgery consulted for revascularization-as poor blood flow noted during surgery. -Status post angiogram on 03/05/2018 showing significant occlusions-repeat angiogram on 03/08/2018 with pedal approach, if that is not successful, patient will need bypass surgery. -Left calf pain since angiogram yesterday.  Communicated to vascular team  2.  Diabetes mellitus-sliding scale insulin.  A1c of 8 -Added januvia/tradjenta and lantus  here.  Since sugars are elevated, increase Lantus. -Metformin can be added after discharge as he needs an angiogram and contrast exposure  3.  Chronic back pain-patient on Lyrica, Cymbalta, oxycodone and OxyContin  4.  Hypertension-benazepril and hydrochlorothiazide.  5.  DVT prophylaxis-on Lovenox  6.  Constipation-meds added- relieved  PT recommended SNF at this point.     All the records are reviewed and case discussed with Care Management/Social Workerr. Management plans discussed with the patient, family and they are in agreement.  CODE STATUS: Full code  TOTAL TIME TAKING CARE OF THIS PATIENT: 36 minutes.   POSSIBLE D/C IN 2 DAYS, DEPENDING ON CLINICAL CONDITION.   Gladstone Lighter M.D on 03/06/2018 at 1:41 PM  Between 7am to 6pm - Pager - [phone]  After 6pm go to [email or web address] - password [email or web address] [facility]  Office  [phone]  CC: Primary care physician; Lavera Guise, MD

## 2018-03-06 NOTE — Progress Notes (Signed)
1 Day Post-Op   Subjective/Chief Complaint: Patient seen.  Still relates he is not feeling well and has some cramping in his left leg.   Objective: Vital signs in last 24 hours: Temp:  [97.5 F (36.4 C)-98.6 F (37 C)] 97.5 F (36.4 C) (10/22 0800) Pulse Rate:  [59-90] 60 (10/22 1128) Resp:  [12-21] 18 (10/22 0800) BP: (102-173)/(72-84) 153/77 (10/22 1128) SpO2:  [95 %-99 %] 99 % (10/22 0800) Last BM Date: 03/03/18  Intake/Output from previous day: 10/21 0701 - 10/22 0700 In: 640.2 [I.V.:53.7; IV Piggyback:586.5] Out: 1400 [Urine:1400] Intake/Output this shift: Total I/O In: -  Out: 200 [Urine:200]  Bandage on the left foot is dry and intact.  No evidence of drainage or strikethrough.  Lab Results:  No results for input(s): WBC, HGB, HCT, PLT in the last 72 hours. BMET Recent Labs    03/06/18 0411  NA 141  K 3.8  CL 106  CO2 30  GLUCOSE 206*  BUN 19  CREATININE 0.86  CALCIUM 8.4*   PT/INR No results for input(s): LABPROT, INR in the last 72 hours. ABG No results for input(s): PHART, HCO3 in the last 72 hours.  Invalid input(s): PCO2, PO2  Studies/Results: No results found.  Anti-infectives: Anti-infectives (From admission, onward)   Start     Dose/Rate Route Frequency Ordered Stop   03/05/18 1316  vancomycin (VANCOCIN) 1,500 mg in sodium chloride 0.9 % 500 mL IVPB  Status:  Discontinued     1,500 mg 250 mL/hr over 120 Minutes Intravenous 120 min pre-op 03/05/18 1316 03/05/18 1648   03/02/18 0000  vancomycin (VANCOCIN) 1,250 mg in sodium chloride 0.9 % 250 mL IVPB     1,250 mg 166.7 mL/hr over 90 Minutes Intravenous Every 8 hours 03/01/18 2224     03/01/18 2245  meropenem (MERREM) 1 g in sodium chloride 0.9 % 100 mL IVPB     1 g 200 mL/hr over 30 Minutes Intravenous Every 8 hours 03/01/18 2241     03/01/18 1700  vancomycin (VANCOCIN) IVPB 1000 mg/200 mL premix     1,000 mg 200 mL/hr over 60 Minutes Intravenous  Once 03/01/18 1659 03/01/18 1956       Assessment/Plan: s/p Procedure(s): Lower Extremity Angiography (Left) Assessment: Abscess left foot status post revascularization attempt.   Plan: Dressing left intact today.  At this point still trying to figure out when Dr. Lucky Cowboy may be able to attempt his revascularization.  Questions about inpatient versus outpatient.  The patient will still need some type of debridement on his left foot and would recommend within a day or so of his revascularization.  If discharged he will need some type of home health care for dressing changes.  At this point we will try to figure out timing for his procedures and coordinate accordingly.  LOS: 5 days    Victor Wise 03/06/2018

## 2018-03-06 NOTE — Progress Notes (Signed)
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

## 2018-03-06 NOTE — Progress Notes (Signed)
PT Cancellation Note  Patient Details Name: Victor Wise MRN: [id number] DOB: [birth year]   Cancelled Treatment:    Reason Eval/Treat Not Completed: Other (comment)   Offered and encouraged session this am.  Pt reported being tired and is awaiting a procedure today.  Declined therapy.  Will continue as appropriate.  Chesley Noon 03/06/2018, 12:23 PM

## 2018-03-07 DIAGNOSIS — L089 Local infection of the skin and subcutaneous tissue, unspecified: Secondary | ICD-10-CM

## 2018-03-07 DIAGNOSIS — L03116 Cellulitis of left lower limb: Secondary | ICD-10-CM

## 2018-03-07 DIAGNOSIS — M79605 Pain in left leg: Secondary | ICD-10-CM

## 2018-03-07 DIAGNOSIS — E11628 Type 2 diabetes mellitus with other skin complications: Secondary | ICD-10-CM

## 2018-03-07 DIAGNOSIS — L97909 Non-pressure chronic ulcer of unspecified part of unspecified lower leg with unspecified severity: Secondary | ICD-10-CM

## 2018-03-07 DIAGNOSIS — I70299 Other atherosclerosis of native arteries of extremities, unspecified extremity: Secondary | ICD-10-CM

## 2018-03-07 LAB — BASIC METABOLIC PANEL WITH GFR
Anion gap: 8 (ref 5–15)
BUN: 18 mg/dL (ref 8–23)
CO2: 27 mmol/L (ref 22–32)
Calcium: 8.5 mg/dL — ABNORMAL LOW (ref 8.9–10.3)
Chloride: 106 mmol/L (ref 98–111)
Creatinine, Ser: 0.88 mg/dL (ref 0.61–1.24)
GFR calc Af Amer: >60 mL/min
GFR calc non Af Amer: >60 mL/min
Glucose, Bld: 244 mg/dL — ABNORMAL HIGH (ref 70–99)
Potassium: 3.6 mmol/L (ref 3.5–5.1)
Sodium: 141 mmol/L (ref 135–145)

## 2018-03-07 LAB — AEROBIC/ANAEROBIC CULTURE W GRAM STAIN (SURGICAL/DEEP WOUND)

## 2018-03-07 LAB — GLUCOSE, CAPILLARY
GLUCOSE-CAPILLARY: 174 mg/dL — AB (ref 70–99)
GLUCOSE-CAPILLARY: 152 mg/dL — AB (ref 70–99)
Glucose-Capillary: 175 mg/dL — ABNORMAL HIGH (ref 70–99)
Glucose-Capillary: 155 mg/dL — ABNORMAL HIGH (ref 70–99)

## 2018-03-07 LAB — VANCOMYCIN, TROUGH: VANCOMYCIN TR: 17 ug/mL (ref 15–20)

## 2018-03-07 MED ORDER — SODIUM CHLORIDE 0.9 % IV SOLN
INTRAVENOUS | Status: DC
  Administered 2018-03-07 – 2018-03-09 (×4): via INTRAVENOUS

## 2018-03-07 MED ORDER — INSULIN GLARGINE 100 UNIT/ML ~~LOC~~ SOLN
10.0000 [IU] | Freq: Once | SUBCUTANEOUS | Status: AC
  Administered 2018-03-07: 10 [IU] via SUBCUTANEOUS
  Filled 2018-03-07: qty 0.1

## 2018-03-07 MED ORDER — SENNOSIDES-DOCUSATE SODIUM 8.6-50 MG PO TABS
1.0000 | ORAL_TABLET | Freq: Two times a day (BID) | ORAL | Status: DC
  Administered 2018-03-07 – 2018-03-13 (×11): 1 via ORAL
  Filled 2018-03-07 (×12): qty 1

## 2018-03-07 MED ORDER — SODIUM CHLORIDE 0.9 % IV SOLN
2.0000 g | INTRAVENOUS | Status: DC
  Administered 2018-03-07 – 2018-03-09 (×3): 2 g via INTRAVENOUS
  Filled 2018-03-07 (×3): qty 2
  Filled 2018-03-07: qty 20

## 2018-03-07 MED ORDER — METRONIDAZOLE IN NACL 5-0.79 MG/ML-% IV SOLN
500.0000 mg | Freq: Three times a day (TID) | INTRAVENOUS | Status: DC
  Administered 2018-03-07 – 2018-03-10 (×8): 500 mg via INTRAVENOUS
  Filled 2018-03-07 (×10): qty 100

## 2018-03-07 NOTE — Progress Notes (Signed)
PT Cancellation Note  Patient Details Name: Victor Wise MRN: [id number] DOB: 1950-09-19   Cancelled Treatment:    Reason Eval/Treat Not Completed: Fatigue/lethargy limiting ability to participate;Pain limiting ability to participate. Treatment attempted; pt sleeping/lethargic upon waking pt. Pt reports severe pain in LLE; pt post medication, which is making him sleepy. Pt refused PT at this time stating he cannot walk; pt also refuses exercises due to the pain. Pt notes he will be having another angioplasty procedure to LLE tomorrow. Re attempt at a later time/date, as the schedule allows.    Larae Grooms, PTA 03/07/2018, 2:51 PM

## 2018-03-07 NOTE — Progress Notes (Signed)
Patients blood sugar 152 MD notified due to patient being NPO after midnight and 36 units of lantus scheduled. Dr.S Sudini aware and placing new orders .

## 2018-03-07 NOTE — Progress Notes (Addendum)
[facility] [place] NAME: Victor Wise    MR#:  [id number]  DATE OF BIRTH:  [birth year]  SUBJECTIVE:  CHIEF COMPLAINT:   Chief Complaint  Patient presents with  . Wound Infection  . Foot Pain   - left foot wound. S/p deep I&D -Had angiogram done yesterday, significant occlusions noted, repeat angiogram needed-scheduled for Thursday.  Will need to repeat for debridement after his vascularization is successful. -Patient is resting comfortably and calf pain improved  REVIEW OF SYSTEMS:  Review of Systems  Constitutional: Negative for chills and fever.  HENT: Negative for ear pain, hearing loss and tinnitus.   Eyes: Negative for blurred vision and double vision.  Respiratory: Negative for cough, shortness of breath and wheezing.   Cardiovascular: Positive for leg swelling. Negative for chest pain and palpitations.  Gastrointestinal: Negative for abdominal pain, constipation, diarrhea, nausea and vomiting.  Genitourinary: Negative for dysuria.  Musculoskeletal: Negative for myalgias.  Skin: Negative for rash.  Neurological: Positive for weakness. Negative for dizziness, focal weakness, seizures and headaches.  Psychiatric/Behavioral: Negative for depression.    DRUG ALLERGIES:   Allergies  Allergen Reactions  . Amoxicillin Other (See Comments)    Has never taken amoxicillin - told never to take by "foot" doctor (unclear reason why) Has patient had a PCN reaction causing immediate rash, facial/tongue/throat swelling, SOB or lightheadedness with hypotension: No Has patient had a PCN reaction causing severe rash involving mucus membranes or skin necrosis: No Has patient had a PCN reaction that required hospitalization No Has patient had a PCN reaction occurring within the last 10 years: No If above answers are "NO", then may proceed w/ Cephalo  . Other Anaphylaxis, Itching and Other (See Comments)    Pt states that he is allergic to  Endopa.  Reaction:  Anaphylaxis  Pt states that he is allergic to Metabisulfites Reaction:  Itching   . Penicillins Other (See Comments)    Arm turned "blue" at injection site (no treatment needed) Has patient had a PCN reaction causing immediate rash, facial/tongue/throat swelling, SOB or lightheadedness with hypotension: No Has patient had a PCN reaction causing severe rash involving mucus membranes or skin necrosis: No Has patient had a PCN reaction that required hospitalization No Has patient had a PCN reaction occurring within the last 10 years: No If all of the above answers are "NO", then may proceed with Cephalosporin use.  [name] Hydralazine Other (See Comments)    Reaction:  Cramping of extremities  . Crestor [Rosuvastatin Calcium] Other (See Comments)    Reaction:  Pt is unable to move arms/legs   . Metoprolol Nausea And Vomiting  . Red Dye Itching  . Statins Other (See Comments)    Reaction:  Pt is unable to move arms/legs  . Sulfa Antibiotics Itching    Other reaction(s): Unknown  . Yellow Dyes (Non-Tartrazine) Itching  . Aspirin Itching and Other (See Comments)    Pt states that he is able to use in lower doses.    . Tape Rash    Please use "paper" tape only. Please use "paper" tape only.    VITALS:  Blood pressure (!) 152/66, pulse 61, temperature 98.1 F (36.7 C), temperature source Oral, resp. rate 18, height 5\' 8"  (1.727 m), weight 133.3 kg, SpO2 97 %.  PHYSICAL EXAMINATION:  Physical Exam  GENERAL:  67 y.o.-year-old obese patient lying in the bed with no acute distress.  EYES: Pupils equal, round, reactive to light  and accommodation. No scleral icterus. Extraocular muscles intact.  HEENT: Head atraumatic, normocephalic. Oropharynx and nasopharynx clear.  NECK:  Supple, no jugular venous distention. No thyroid enlargement, no tenderness.  LUNGS: Normal breath sounds bilaterally, no wheezing, rales,rhonchi or crepitation. No use of accessory muscles of respiration.   Decreased bibasilar breath sounds CARDIOVASCULAR: S1, S2 normal. No murmurs, rubs, or gallops.  ABDOMEN: Soft, nontender, nondistended. Bowel sounds present. No organomegaly or mass.  EXTREMITIES: No pedal edema, cyanosis, or clubbing.  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   - Left foot dressing in place. NEUROLOGIC: Cranial nerves II through XII are intact. Muscle strength 5/5 in all extremities. Sensation intact. Gait not checked.  PSYCHIATRIC: The patient is alert and oriented x 3.  SKIN: No obvious rash, lesion, or ulcer.    LABORATORY PANEL:   CBC Recent Labs  Lab 03/03/18 0726  WBC 6.4  HGB 10.5*  HCT 33.7*  PLT 225   ------------------------------------------------------------------------------------------------------------------  Chemistries  Recent Labs  Lab 03/07/18 0419  NA 141  K 3.6  CL 106  CO2 27  GLUCOSE 244*  BUN 18  CREATININE 0.88  CALCIUM 8.5*   ------------------------------------------------------------------------------------------------------------------  Cardiac Enzymes No results for input(s): TROPONINI in the last 168 hours. ------------------------------------------------------------------------------------------------------------------  RADIOLOGY:  No results found.  EKG:   Orders placed or performed during the hospital encounter of 10/26/15  . EKG 12-Lead  . EKG 12-Lead  . EKG 12-Lead in am (before 8am)  . EKG 12-Lead in am (before 8am)  . EKG    ASSESSMENT AND PLAN:   67 year old male with past medical history significant for diabetes, hypertension, chronic low back pain and CAD resents to hospital secondary to left foot diabetic ulcer  1.  Left foot cellulitis and diabetic foot ulcer-appreciate podiatry consult - left plantar 1st MTP ulcer- s/p I&D and packing done -On vancomycin and meropenem, cultures growing from wound growing enterococcus and E. coli. -Change IV antibiotics to oral based on  sensitivities.  ID is following =-X-rays negative for osteomyelitis -Vernard Gambles will need repeat debridement of the wound after vascular flow is established -partial Weightbearing as tolerated -Also vascular surgery consulted for revascularization-as poor blood flow noted during surgery. -Status post angiogram on 03/05/2018 showing significant occlusions-repeat angiogram on 03/08/2018 with pedal approach, if that is not successful, patient will need bypass surgery. -Left calf pain improved -N.p.o. after midnight for repeat angiogram tomorrow  2.  Diabetes mellitus-sliding scale insulin.  A1c of 8 -Added januvia/tradjenta and lantus  here.  Since sugars are elevated, increase Lantus. -Metformin can be added after discharge as he needs an angiogram and contrast exposure  3.  Chronic back pain-patient on Lyrica, Cymbalta, oxycodone and OxyContin  4.  Hypertension-benazepril and hydrochlorothiazide.  5.  DVT prophylaxis-on Lovenox  6.  Constipation-meds added- relieved  PT recommended SNF at this point.     All the records are reviewed and case discussed with Care Management/Social Workerr. Management plans discussed with the patient, family and they are in agreement.  CODE STATUS: Full code  TOTAL TIME TAKING CARE OF THIS PATIENT: 36 minutes.   POSSIBLE D/C IN 3  DAYS, DEPENDING ON CLINICAL CONDITION.   Nicholes Mango M.D on 03/07/2018 at 3:53 PM  Between 7am to 6pm - Pager - [phone]   After 6pm go to [email or web address] - password [email or web address] [facility]  Office  [phone]  CC: Primary care physician; Lavera Guise, MD

## 2018-03-07 NOTE — Progress Notes (Signed)
Patient refused CPAP.

## 2018-03-07 NOTE — Care Management Important Message (Signed)
Important Message  Patient Details  Name: Victor Wise MRN: [id number] Date of Birth: Sep 24, 1950   Medicare Important Message Given:  Yes    Juliann Pulse A Axle Parfait 03/07/2018, 12:44 PM

## 2018-03-07 NOTE — Progress Notes (Signed)
[facility]  Daily Progress Note   Subjective  - 2 Days Post-Op  Patient sleeping.  Does not really awaken on exam. Left leg warm, minimal swelling.  Calf is soft and minimal bruising is present  Objective Vitals:   03/06/18 1650 03/06/18 2348 03/07/18 0514 03/07/18 0728  BP: (!) 143/63 133/67  (!) 144/59  Pulse: 60 60  (!) 59  Resp: 18 15  18   Temp: 98.3 F (36.8 C) 98.2 F (36.8 C)  98.1 F (36.7 C)  TempSrc: Oral Oral  Oral  SpO2: 95% 94%  97%  Weight:   133.3 kg   Height:        Intake/Output Summary (Last 24 hours) at 03/07/2018 1008 Last data filed at 03/07/2018 0745 Gross per 24 hour  Intake 749.16 ml  Output 250 ml  Net 499.16 ml    PULM  CTAB CV  RRR VASC  No palpable pedal pulses on left, foot wrapped  Laboratory CBC    Component Value Date/Time   WBC 6.4 03/03/2018 0726   HGB 10.5 (L) 03/03/2018 0726   HGB 11.6 (L) 02/06/2014 1628   HCT 33.7 (L) 03/03/2018 0726   HCT 35.9 (L) 02/06/2014 1628   PLT 225 03/03/2018 0726   PLT 221 02/06/2014 1628    BMET    Component Value Date/Time   NA 141 03/07/2018 0419   NA 138 02/06/2014 1628   K 3.6 03/07/2018 0419   K 4.6 02/06/2014 1628   CL 106 03/07/2018 0419   CL 102 02/06/2014 1628   CO2 27 03/07/2018 0419   CO2 30 02/06/2014 1628   GLUCOSE 244 (H) 03/07/2018 0419   GLUCOSE 139 (H) 02/06/2014 1628   BUN 18 03/07/2018 0419   BUN 31 (H) 02/06/2014 1628   CREATININE 0.88 03/07/2018 0419   CREATININE 1.16 02/06/2014 1628   CALCIUM 8.5 (L) 03/07/2018 0419   CALCIUM 8.6 02/06/2014 1628   GFRNONAA >60 03/07/2018 0419   GFRNONAA >60 02/06/2014 1628   GFRNONAA >60 12/18/2013 0435   GFRAA >60 03/07/2018 0419   GFRAA >60 02/06/2014 1628   GFRAA >60 12/18/2013 0435    Assessment/Planning: POD #2 s/p left leg angiogram   Will plan an attempt at revascularization tomorrow with pedal and femoral approach.  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  High risk  of limb loss     Victor Wise  03/07/2018, 10:08 AM

## 2018-03-07 NOTE — Progress Notes (Signed)
Pharmacy Antibiotic Note  Victor Wise is a 67 y.o. male admitted on 03/01/2018 with wound infection.  Pharmacy has been consulted for Vancomycin, Meropenem dosing.  Plan: 10/23 @ 0400 VT 17 mcg/mL drawn almost an hour too late, true trough around 19 mcg/mL. Will continue current rate and will recheck next trough 10/24 @ 0600. Renal function improving will continue to monitor.  Height: 5\' 8"  (172.7 cm) Weight: 293 lb 14 oz (133.3 kg) IBW/kg (Calculated) : 68.4  Temp (24hrs), Avg:98 F (36.7 C), Min:97.5 F (36.4 C), Max:98.3 F (36.8 C)  Recent Labs  Lab 03/01/18 1514 03/02/18 0435 03/03/18 0726 03/06/18 0411 03/07/18 0419  WBC 9.1 7.2 6.4  --   --   CREATININE 0.91 0.86 1.05 0.86 0.88  VANCOTROUGH  --   --  17  --  17    Estimated Creatinine Clearance: 110.3 mL/min (by C-G formula based on SCr of 0.88 mg/dL).    Allergies  Allergen Reactions  . Amoxicillin Shortness Of Breath, Itching and Other (See Comments)    Has patient had a PCN reaction causing immediate rash, facial/tongue/throat swelling, SOB or lightheadedness with hypotension: Yes Has patient had a PCN reaction causing severe rash involving mucus membranes or skin necrosis: No Has patient had a PCN reaction that required hospitalization No Has patient had a PCN reaction occurring within the last 10 years: No If all of the above answers are "NO", then may proceed with Cephalosporin use.  . Other Anaphylaxis, Itching and Other (See Comments)    Pt states that he is allergic to Endopa.  Reaction:  Anaphylaxis  Pt states that he is allergic to Metabisulfites Reaction:  Itching   . Penicillins Shortness Of Breath, Itching and Other (See Comments)    Has patient had a PCN reaction causing immediate rash, facial/tongue/throat swelling, SOB or lightheadedness with hypotension: Yes Has patient had a PCN reaction causing severe rash involving mucus membranes or skin necrosis: No Has patient had a PCN reaction that required  hospitalization No Has patient had a PCN reaction occurring within the last 10 years: No If all of the above answers are "NO", then may proceed with Cephalosporin use.  [name] Hydralazine Other (See Comments)    Reaction:  Cramping of extremities  . Crestor [Rosuvastatin Calcium] Other (See Comments)    Reaction:  Pt is unable to move arms/legs   . Metoprolol Nausea And Vomiting  . Red Dye Itching  . Statins Other (See Comments)    Reaction:  Pt is unable to move arms/legs  . Sulfa Antibiotics Itching    Other reaction(s): Unknown  . Yellow Dyes (Non-Tartrazine) Itching  . Aspirin Itching and Other (See Comments)    Pt states that he is able to use in lower doses.    . Tape Rash    Please use "paper" tape only. Please use "paper" tape only.    Antimicrobials this admission:   >>    >>   Dose adjustments this admission:   Microbiology results:  BCx:   UCx:    Sputum:    MRSA PCR:   Thank you for allowing pharmacy to be a part of this patient's care.  Tobie Lords, PharmD, BCPS Clinical Pharmacist 03/07/2018

## 2018-03-07 NOTE — Progress Notes (Signed)
2 Days Post-Op   Subjective/Chief Complaint: Patient seen.  Still significant cramping in the left leg with no real improvement.   Objective: Vital signs in last 24 hours: Temp:  [98.1 F (36.7 C)-98.3 F (36.8 C)] 98.1 F (36.7 C) (10/23 0728) Pulse Rate:  [59-61] 61 (10/23 1055) Resp:  [15-18] 18 (10/23 0728) BP: (133-152)/(59-67) 152/66 (10/23 1055) SpO2:  [94 %-97 %] 97 % (10/23 0728) Weight:  [133.3 kg] 133.3 kg (10/23 0514) Last BM Date: 03/03/18  Intake/Output from previous day: 10/22 0701 - 10/23 0700 In: 749.2 [IV Piggyback:749.2] Out: 200 [Urine:200] Intake/Output this shift: Total I/O In: -  Out: 450 [Urine:450]  The bandage on the left foot is dry and intact.  Upon removal there is minimal drainage from the medial aspect of the great toe joint.  Some mild increase in the incisional necrosis.  No surrounding erythema.  Lab Results:  No results for input(s): WBC, HGB, HCT, PLT in the last 72 hours. BMET Recent Labs    03/06/18 0411 03/07/18 0419  NA 141 141  K 3.8 3.6  CL 106 106  CO2 30 27  GLUCOSE 206* 244*  BUN 19 18  CREATININE 0.86 0.88  CALCIUM 8.4* 8.5*   PT/INR No results for input(s): LABPROT, INR in the last 72 hours. ABG No results for input(s): PHART, HCO3 in the last 72 hours.  Invalid input(s): PCO2, PO2  Studies/Results: No results found.  Anti-infectives: Anti-infectives (From admission, onward)   Start     Dose/Rate Route Frequency Ordered Stop   03/05/18 1316  vancomycin (VANCOCIN) 1,500 mg in sodium chloride 0.9 % 500 mL IVPB  Status:  Discontinued     1,500 mg 250 mL/hr over 120 Minutes Intravenous 120 min pre-op 03/05/18 1316 03/05/18 1648   03/02/18 0000  vancomycin (VANCOCIN) 1,250 mg in sodium chloride 0.9 % 250 mL IVPB     1,250 mg 166.7 mL/hr over 90 Minutes Intravenous Every 8 hours 03/01/18 2224     03/01/18 2245  meropenem (MERREM) 1 g in sodium chloride 0.9 % 100 mL IVPB     1 g 200 mL/hr over 30 Minutes  Intravenous Every 8 hours 03/01/18 2241     03/01/18 1700  vancomycin (VANCOCIN) IVPB 1000 mg/200 mL premix     1,000 mg 200 mL/hr over 60 Minutes Intravenous  Once 03/01/18 1659 03/01/18 1956      Assessment/Plan: s/p Procedure(s): Lower Extremity Angiography (Left) Assessment: Abscess left foot status post debridement with severe peripheral vascular disease.   Plan: Sterile saline wet-to-dry gauze reapplied to the left foot.  Plan is for repeat angiogram tomorrow.  Will determine debridement potential following his angiogram as well as timing.  LOS: 6 days    Victor Wise 03/07/2018

## 2018-03-08 ENCOUNTER — Inpatient Hospital Stay: Payer: Medicare HMO | Admitting: Anesthesiology

## 2018-03-08 ENCOUNTER — Other Ambulatory Visit (INDEPENDENT_AMBULATORY_CARE_PROVIDER_SITE_OTHER): Payer: Self-pay | Admitting: Nurse Practitioner

## 2018-03-08 ENCOUNTER — Ambulatory Visit: Payer: Self-pay | Admitting: Adult Health

## 2018-03-08 ENCOUNTER — Encounter
Admission: EM | Disposition: A | Discharge status: Skilled Nursing Facility | Payer: Self-pay | Source: Home / Self Care | Attending: Internal Medicine

## 2018-03-08 ENCOUNTER — Encounter: Payer: Self-pay | Admitting: Anesthesiology

## 2018-03-08 DIAGNOSIS — I70245 Atherosclerosis of native arteries of left leg with ulceration of other part of foot: Secondary | ICD-10-CM

## 2018-03-08 DIAGNOSIS — I739 Peripheral vascular disease, unspecified: Secondary | ICD-10-CM

## 2018-03-08 DIAGNOSIS — L97529 Non-pressure chronic ulcer of other part of left foot with unspecified severity: Secondary | ICD-10-CM

## 2018-03-08 HISTORY — PX: IRRIGATION AND DEBRIDEMENT FOOT: SHX6602

## 2018-03-08 HISTORY — PX: LOWER EXTREMITY ANGIOGRAPHY: CATH118251

## 2018-03-08 LAB — GLUCOSE, CAPILLARY
GLUCOSE-CAPILLARY: 164 mg/dL — AB (ref 70–99)
GLUCOSE-CAPILLARY: 157 mg/dL — AB (ref 70–99)
GLUCOSE-CAPILLARY: 145 mg/dL — AB (ref 70–99)
Glucose-Capillary: 167 mg/dL — ABNORMAL HIGH (ref 70–99)
Glucose-Capillary: 166 mg/dL — ABNORMAL HIGH (ref 70–99)
Glucose-Capillary: 151 mg/dL — ABNORMAL HIGH (ref 70–99)

## 2018-03-08 LAB — CBC
HCT: 30.1 % — ABNORMAL LOW (ref 39.0–52.0)
Hemoglobin: 9.4 g/dL — ABNORMAL LOW (ref 13.0–17.0)
MCH: 25.2 pg — ABNORMAL LOW (ref 26.0–34.0)
MCHC: 31.2 g/dL (ref 30.0–36.0)
MCV: 80.7 fL (ref 80.0–100.0)
NRBC: 0 % (ref 0.0–0.2)
PLATELETS: 277 10*3/uL (ref 150–400)
RBC: 3.73 MIL/uL — ABNORMAL LOW (ref 4.22–5.81)
RDW: 16 % — ABNORMAL HIGH (ref 11.5–15.5)
WBC: 5.1 10*3/uL (ref 4.0–10.5)

## 2018-03-08 LAB — BASIC METABOLIC PANEL
ANION GAP: 5 (ref 5–15)
BUN: 17 mg/dL (ref 8–23)
CO2: 30 mmol/L (ref 22–32)
Calcium: 8.5 mg/dL — ABNORMAL LOW (ref 8.9–10.3)
Chloride: 106 mmol/L (ref 98–111)
Creatinine, Ser: 0.86 mg/dL (ref 0.61–1.24)
Glucose, Bld: 189 mg/dL — ABNORMAL HIGH (ref 70–99)
POTASSIUM: 3.9 mmol/L (ref 3.5–5.1)
SODIUM: 141 mmol/L (ref 135–145)

## 2018-03-08 LAB — VANCOMYCIN, TROUGH: Vancomycin Tr: 20 ug/mL (ref 15–20)

## 2018-03-08 LAB — MAGNESIUM: MAGNESIUM: 2 mg/dL (ref 1.7–2.4)

## 2018-03-08 SURGERY — LOWER EXTREMITY ANGIOGRAPHY
Anesthesia: Moderate Sedation | Laterality: Left

## 2018-03-08 SURGERY — IRRIGATION AND DEBRIDEMENT FOOT
Anesthesia: General | Laterality: Left

## 2018-03-08 MED ORDER — FENTANYL CITRATE (PF) 100 MCG/2ML IJ SOLN
INTRAMUSCULAR | Status: AC
  Filled 2018-03-08: qty 2

## 2018-03-08 MED ORDER — SODIUM CHLORIDE FLUSH 0.9 % IV SOLN
INTRAVENOUS | Status: AC
  Filled 2018-03-08: qty 20

## 2018-03-08 MED ORDER — MIDAZOLAM HCL 5 MG/5ML IJ SOLN
INTRAMUSCULAR | Status: AC
  Filled 2018-03-08: qty 5

## 2018-03-08 MED ORDER — ROCURONIUM BROMIDE 100 MG/10ML IV SOLN
INTRAVENOUS | Status: DC | PRN
  Administered 2018-03-08: 20 mg via INTRAVENOUS

## 2018-03-08 MED ORDER — FENTANYL CITRATE (PF) 100 MCG/2ML IJ SOLN
INTRAMUSCULAR | Status: DC | PRN
  Administered 2018-03-08 (×2): 50 ug via INTRAVENOUS

## 2018-03-08 MED ORDER — PROPOFOL 10 MG/ML IV BOLUS
INTRAVENOUS | Status: DC | PRN
  Administered 2018-03-08: 60 mg via INTRAVENOUS
  Administered 2018-03-08: 140 mg via INTRAVENOUS

## 2018-03-08 MED ORDER — MIDAZOLAM HCL 2 MG/2ML IJ SOLN
INTRAMUSCULAR | Status: DC | PRN
  Administered 2018-03-08: 1 mg via INTRAVENOUS
  Administered 2018-03-08: 2 mg via INTRAVENOUS
  Administered 2018-03-08: 1 mg via INTRAVENOUS

## 2018-03-08 MED ORDER — FENTANYL CITRATE (PF) 100 MCG/2ML IJ SOLN: 25.0000 ug | INTRAMUSCULAR | Status: DC | PRN

## 2018-03-08 MED ORDER — PROPOFOL 500 MG/50ML IV EMUL
INTRAVENOUS | Status: AC
  Filled 2018-03-08: qty 50

## 2018-03-08 MED ORDER — LIDOCAINE HCL (CARDIAC) PF 100 MG/5ML IV SOSY
PREFILLED_SYRINGE | INTRAVENOUS | Status: DC | PRN
  Administered 2018-03-08: 100 mg via INTRAVENOUS

## 2018-03-08 MED ORDER — ONDANSETRON HCL 4 MG/2ML IJ SOLN
INTRAMUSCULAR | Status: DC | PRN
  Administered 2018-03-08: 4 mg via INTRAVENOUS

## 2018-03-08 MED ORDER — FENTANYL CITRATE (PF) 100 MCG/2ML IJ SOLN
INTRAMUSCULAR | Status: DC | PRN
  Administered 2018-03-08 (×3): 50 ug via INTRAVENOUS

## 2018-03-08 MED ORDER — BUPIVACAINE HCL (PF) 0.5 % IJ SOLN
INTRAMUSCULAR | Status: DC | PRN
  Administered 2018-03-08: 10 mL

## 2018-03-08 MED ORDER — VANCOMYCIN HCL 1000 MG IV SOLR
INTRAVENOUS | Status: DC | PRN
  Administered 2018-03-08: 1000 mg

## 2018-03-08 MED ORDER — IOHEXOL 300 MG/ML  SOLN
INTRAMUSCULAR | Status: DC | PRN
  Administered 2018-03-08: 10 mL via INTRAVENOUS

## 2018-03-08 MED ORDER — SUGAMMADEX SODIUM 200 MG/2ML IV SOLN
INTRAVENOUS | Status: AC
  Filled 2018-03-08: qty 2

## 2018-03-08 MED ORDER — SUCCINYLCHOLINE CHLORIDE 20 MG/ML IJ SOLN
INTRAMUSCULAR | Status: DC | PRN
  Administered 2018-03-08: 140 mg via INTRAVENOUS

## 2018-03-08 MED ORDER — LIDOCAINE-EPINEPHRINE (PF) 1 %-1:200000 IJ SOLN
INTRAMUSCULAR | Status: AC
  Filled 2018-03-08: qty 30

## 2018-03-08 MED ORDER — HEPARIN SODIUM (PORCINE) 1000 UNIT/ML IJ SOLN
INTRAMUSCULAR | Status: AC
  Filled 2018-03-08: qty 1

## 2018-03-08 MED ORDER — HEPARIN SODIUM (PORCINE) 1000 UNIT/ML IJ SOLN
INTRAMUSCULAR | Status: DC | PRN
  Administered 2018-03-08: 3000 [IU] via INTRAVENOUS
  Administered 2018-03-08: 4000 [IU] via INTRAVENOUS

## 2018-03-08 MED ORDER — HEPARIN (PORCINE) IN NACL 1000-0.9 UT/500ML-% IV SOLN
INTRAVENOUS | Status: AC
  Filled 2018-03-08: qty 1000

## 2018-03-08 MED ORDER — ONDANSETRON HCL 4 MG/2ML IJ SOLN: 4.0000 mg | Freq: Once | INTRAMUSCULAR | Status: DC | PRN

## 2018-03-08 MED ORDER — IOPAMIDOL (ISOVUE-300) INJECTION 61%
INTRAVENOUS | Status: DC | PRN
  Administered 2018-03-08: 100 mL via INTRA_ARTERIAL

## 2018-03-08 MED ORDER — SUGAMMADEX SODIUM 200 MG/2ML IV SOLN
INTRAVENOUS | Status: DC | PRN
  Administered 2018-03-08: 200 mg via INTRAVENOUS

## 2018-03-08 SURGICAL SUPPLY — 50 items
"PENCIL ELECTRO HAND CTR " (MISCELLANEOUS) ×1 IMPLANT
BANDAGE ELASTIC 4 LF NS (GAUZE/BANDAGES/DRESSINGS) ×3 IMPLANT
BLADE MED AGGRESSIVE (BLADE) ×2 IMPLANT
BLADE OSCILLATING/SAGITTAL (BLADE)
BLADE SURG 15 STRL LF DISP TIS (BLADE) ×1 IMPLANT
BLADE SURG 15 STRL SS (BLADE) ×2
BLADE SW THK.38XMED LNG THN (BLADE) IMPLANT
BNDG ESMARK 4X12 TAN STRL LF (GAUZE/BANDAGES/DRESSINGS) ×1 IMPLANT
BNDG GAUZE 4.5X4.1 6PLY STRL (MISCELLANEOUS) ×3 IMPLANT
CANISTER SUCT 1200ML W/VALVE (MISCELLANEOUS) ×3 IMPLANT
COVER WAND RF STERILE (DRAPES) ×3 IMPLANT
CUFF TOURN 18 STER (MISCELLANEOUS) ×3 IMPLANT
CUFF TOURN DUAL PL 12 NO SLV (MISCELLANEOUS) ×1 IMPLANT
DRAPE FLUOR MINI C-ARM 54X84 (DRAPES) IMPLANT
DURAPREP 26ML APPLICATOR (WOUND CARE) ×3 IMPLANT
ELECT REM PT RETURN 9FT ADLT (ELECTROSURGICAL) ×3
ELECTRODE REM PT RTRN 9FT ADLT (ELECTROSURGICAL) ×1 IMPLANT
GAUZE PETRO XEROFOAM 1X8 (MISCELLANEOUS) ×3 IMPLANT
GAUZE SPONGE 4X4 12PLY STRL (GAUZE/BANDAGES/DRESSINGS) ×3 IMPLANT
GAUZE STRETCH 2X75IN STRL (MISCELLANEOUS) ×3 IMPLANT
GLOVE BIO SURGEON STRL SZ7.5 (GLOVE) ×3 IMPLANT
GLOVE INDICATOR 8.0 STRL GRN (GLOVE) ×3 IMPLANT
GOWN STRL REUS W/ TWL LRG LVL3 (GOWN DISPOSABLE) ×2 IMPLANT
GOWN STRL REUS W/TWL LRG LVL3 (GOWN DISPOSABLE) ×4
HANDPIECE VERSAJET DEBRIDEMENT (MISCELLANEOUS) ×3 IMPLANT
KIT STIMULAN RAPID CURE 5CC (Orthopedic Implant) ×2 IMPLANT
LABEL OR SOLS (LABEL) ×3 IMPLANT
NDL FILTER BLUNT 18X1 1/2 (NEEDLE) ×1 IMPLANT
NDL HYPO 25X1 1.5 SAFETY (NEEDLE) ×3 IMPLANT
NEEDLE FILTER BLUNT 18X 1/2SAF (NEEDLE) ×2
NEEDLE FILTER BLUNT 18X1 1/2 (NEEDLE) ×1 IMPLANT
NEEDLE HYPO 25X1 1.5 SAFETY (NEEDLE) ×9 IMPLANT
NS IRRIG 500ML POUR BTL (IV SOLUTION) ×3 IMPLANT
PACK EXTREMITY ARMC (MISCELLANEOUS) ×3 IMPLANT
PAD ABD DERMACEA PRESS 5X9 (GAUZE/BANDAGES/DRESSINGS) ×6 IMPLANT
PENCIL ELECTRO HAND CTR (MISCELLANEOUS) ×3 IMPLANT
RASP SM TEAR CROSS CUT (RASP) IMPLANT
SOL PREP PVP 2OZ (MISCELLANEOUS) ×3
SOLUTION PREP PVP 2OZ (MISCELLANEOUS) ×1 IMPLANT
STOCKINETTE 48X4 2 PLY STRL (GAUZE/BANDAGES/DRESSINGS) ×1 IMPLANT
STOCKINETTE STRL 4IN 9604848 (GAUZE/BANDAGES/DRESSINGS) ×3 IMPLANT
STOCKINETTE STRL 6IN 960660 (GAUZE/BANDAGES/DRESSINGS) ×3 IMPLANT
SUT ETHILON 4-0 (SUTURE) ×2
SUT ETHILON 4-0 FS2 18XMFL BLK (SUTURE) ×1
SUT VIC AB 3-0 SH 27 (SUTURE) ×2
SUT VIC AB 3-0 SH 27X BRD (SUTURE) ×1 IMPLANT
SUT VIC AB 4-0 FS2 27 (SUTURE) ×3 IMPLANT
SUTURE ETHLN 4-0 FS2 18XMF BLK (SUTURE) ×1 IMPLANT
SYR 10ML LL (SYRINGE) ×6 IMPLANT
SYR 3ML LL SCALE MARK (SYRINGE) ×3 IMPLANT

## 2018-03-08 SURGICAL SUPPLY — 35 items
BALLN DORADO 6X200X135 (BALLOONS) ×3
BALLN LUTONIX 018 4X100X130 (BALLOONS) ×3
BALLN LUTONIX 018 5X220X130 (BALLOONS) ×3
BALLN LUTONIX 018 6X220X130 (BALLOONS) ×3
BALLN ULTRVRSE 3X300X150 (BALLOONS) ×2
BALLN ULTRVRSE 3X300X150 OTW (BALLOONS) ×1
BALLOON DORADO 6X200X135 (BALLOONS) ×1 IMPLANT
BALLOON LUTONIX 018 4X100X130 (BALLOONS) ×1 IMPLANT
BALLOON LUTONIX 018 5X220X130 (BALLOONS) ×1 IMPLANT
BALLOON LUTONIX 018 6X220X130 (BALLOONS) ×1 IMPLANT
BALLOON ULTRVRSE 3X300X150 OTW (BALLOONS) ×1 IMPLANT
CANNULA 5F STIFF (CANNULA) ×3 IMPLANT
CATH BEACON 5 .035 65 KMP TIP (CATHETERS) ×3 IMPLANT
CATH BEACON 5 .035 65 RIM TIP (CATHETERS) ×3 IMPLANT
CATH CXI 4F 90 DAV (CATHETERS) ×3 IMPLANT
CATH CXI SUPP ANG 4FR 135 (CATHETERS) ×1 IMPLANT
CATH CXI SUPP ANG 4FR 135CM (CATHETERS) ×3
DEVICE PRESTO INFLATION (MISCELLANEOUS) ×3 IMPLANT
DEVICE STARCLOSE SE CLOSURE (Vascular Products) ×3 IMPLANT
DEVICE TORQUE .014-.018 (MISCELLANEOUS) ×1 IMPLANT
DEVICE TORQUE .025-.038 (MISCELLANEOUS) ×3 IMPLANT
DRAPE INCISE IOBAN 66X45 STRL (DRAPES) ×3 IMPLANT
DRAPE TABLE BACK 80X90 (DRAPES) ×3 IMPLANT
ENSNARE 9-15 (MISCELLANEOUS) ×3 IMPLANT
GLIDEWIRE ADV .035X180CM (WIRE) ×3 IMPLANT
GUIDEWIRE ADV .018X180CM (WIRE) ×3 IMPLANT
PACK ANGIOGRAPHY (CUSTOM PROCEDURE TRAY) ×3 IMPLANT
SHEATH ANL2 6FRX45 HC (SHEATH) ×3 IMPLANT
SHEATH BRITE TIP 5FRX11 (SHEATH) ×3 IMPLANT
SHEATH GLIDE SLENDER 4/5FR (SHEATH) ×3 IMPLANT
STENT VIABAHN 6X250X120 (Permanent Stent) ×6 IMPLANT
TORQUE DEVICE .014-.018 (MISCELLANEOUS) ×3
TOWEL OR 17X26 4PK STRL BLUE (TOWEL DISPOSABLE) ×3 IMPLANT
WIRE AQUATRACK .035X260CM (WIRE) ×3 IMPLANT
WIRE G V18X300CM (WIRE) ×3 IMPLANT

## 2018-03-08 NOTE — Anesthesia Preprocedure Evaluation (Signed)
Anesthesia Evaluation  Patient identified by MRN, date of birth, ID band Patient awake    Reviewed: Allergy & Precautions, NPO status , Patient's Chart, lab work & pertinent test results  History of Anesthesia Complications Negative for: history of anesthetic complications  Airway Mallampati: III  TM Distance: >3 FB Neck ROM: Full    Dental  (+) Poor Dentition, Missing   Pulmonary neg shortness of breath, sleep apnea and Continuous Positive Airway Pressure Ventilation , neg COPD, former smoker,    breath sounds clear to auscultation- rhonchi (-) wheezing      Cardiovascular hypertension, (-) angina+ Past MI, + Cardiac Stents (last stent ~3 yrs ago) and + Peripheral Vascular Disease  (-) CABG  Rhythm:Regular Rate:Normal - Systolic murmurs and - Diastolic murmurs    Neuro/Psych PSYCHIATRIC DISORDERS Depression CVA, No Residual Symptoms    GI/Hepatic Neg liver ROS, GERD  ,  Endo/Other  diabetes, Insulin Dependent  Renal/GU negative Renal ROS     Musculoskeletal negative musculoskeletal ROS (+)   Abdominal (+) + obese,   Peds  Hematology negative hematology ROS (+)   Anesthesia Other Findings Past Medical History: No date: Allergy No date: Basal cell carcinoma     Comment:  forehead No date: BPH (benign prostatic hyperplasia) No date: Chronic back pain No date: Depression No date: Diabetes (HCC) No date: GERD (gastroesophageal reflux disease) No date: Heart attack ([place]) No date: Hypertension No date: Morbid obesity ([place]) No date: Obstructive sleep apnea No date: Osteomyelitis of foot (HCC) No date: Status post insertion of spinal cord stimulator No date: Stroke ([place]) No date: UTI (lower urinary tract infection)   Reproductive/Obstetrics                             Anesthesia Physical  Anesthesia Plan  ASA: III  Anesthesia Plan: General   Post-op Pain Management:    Induction:  Intravenous  PONV Risk Score and Plan: 1 and Ondansetron  Airway Management Planned: LMA and Oral ETT  Additional Equipment:   Intra-op Plan:   Post-operative Plan: Extubation in OR  Informed Consent: I have reviewed the patients History and Physical, chart, labs and discussed the procedure including the risks, benefits and alternatives for the proposed anesthesia with the patient or authorized representative who has indicated his/her understanding and acceptance.   Dental advisory given  Plan Discussed with: CRNA and Anesthesiologist  Anesthesia Plan Comments:         Anesthesia Quick Evaluation

## 2018-03-08 NOTE — Progress Notes (Signed)
Patient home equipment in room. Equipment does not have any loose or frayed wires and appears in good working condition. CPAP plugged into red outlet. BIOmed to assess in the AM.

## 2018-03-08 NOTE — Progress Notes (Signed)
Went to see patient. He is in OR

## 2018-03-08 NOTE — Interval H&P Note (Signed)
History and Physical Interval Note:  03/08/2018 4:48 PM  Victor Wise  has presented today for surgery, with the diagnosis of n/a  The various methods of treatment have been discussed with the patient and family. After consideration of risks, benefits and other options for treatment, the patient has consented to  Procedure(s): IRRIGATION AND DEBRIDEMENT FOOT AND BONE (Left) as a surgical intervention .  The patient's history has been reviewed, patient examined, no change in status, stable for surgery.  I have reviewed the patient's chart and labs.  Questions were answered to the patient's satisfaction.     Durward Fortes

## 2018-03-08 NOTE — Progress Notes (Signed)
[facility] [place] NAME: Victor Wise    MR#:  [id number]  DATE OF BIRTH:  Jul 28, 1950  SUBJECTIVE:  CHIEF COMPLAINT:   Chief Complaint  Patient presents with  . Wound Infection  . Foot Pain   - left foot wound. S/p deep I&D -Had angiogram, significant occlusions noted, repeat angiogram needed-today Will need to repeat for debridement after his vascularization is successful. -Patient is resting comfortably and calf pain improved  REVIEW OF SYSTEMS:  Review of Systems  Constitutional: Negative for chills and fever.  HENT: Negative for ear pain, hearing loss and tinnitus.   Eyes: Negative for blurred vision and double vision.  Respiratory: Negative for cough, shortness of breath and wheezing.   Cardiovascular: Positive for leg swelling. Negative for chest pain and palpitations.  Gastrointestinal: Negative for abdominal pain, constipation, diarrhea, nausea and vomiting.  Genitourinary: Negative for dysuria.  Musculoskeletal: Negative for myalgias.  Skin: Negative for rash.  Neurological: Positive for weakness. Negative for dizziness, focal weakness, seizures and headaches.  Psychiatric/Behavioral: Negative for depression.    DRUG ALLERGIES:   Allergies  Allergen Reactions  . Amoxicillin Other (See Comments)    Has never taken amoxicillin -? amoxicillin skin test Has patient had a PCN reaction causing immediate rash, facial/tongue/throat swelling, SOB or lightheadedness with hypotension: No Has patient had a PCN reaction causing severe rash involving mucus membranes or skin necrosis: No Has patient had a PCN reaction that required hospitalization No Has patient had a PCN reaction occurring within the last 10 years: No If above answers are "NO", then may proceed w/ Cephalo  . Other Anaphylaxis, Itching and Other (See Comments)    Pt states that he is allergic to Endopa.  Reaction:  Anaphylaxis  Pt states that he is allergic to  Metabisulfites Reaction:  Itching   . Penicillins Other (See Comments)    Arm turned "blue" at injection site (no treatment needed) Has patient had a PCN reaction causing immediate rash, facial/tongue/throat swelling, SOB or lightheadedness with hypotension: No Has patient had a PCN reaction causing severe rash involving mucus membranes or skin necrosis: No Has patient had a PCN reaction that required hospitalization No Has patient had a PCN reaction occurring within the last 10 years: No If all of the above answers are "NO", then may proceed with Cephalosporin use.  [name] Hydralazine Other (See Comments)    Reaction:  Cramping of extremities  . Crestor [Rosuvastatin Calcium] Other (See Comments)    Reaction:  Pt is unable to move arms/legs   . Metoprolol Nausea And Vomiting  . Red Dye Itching  . Statins Other (See Comments)    Reaction:  Pt is unable to move arms/legs  . Sulfa Antibiotics Itching    Other reaction(s): Unknown  . Yellow Dyes (Non-Tartrazine) Itching  . Aspirin Itching and Other (See Comments)    Pt states that he is able to use in lower doses.    . Tape Rash    Please use "paper" tape only. Please use "paper" tape only.    VITALS:  Blood pressure 133/89, pulse 60, temperature 97.7 F (36.5 C), temperature source Oral, resp. rate 14, height 5\' 8"  (1.727 m), weight 133.3 kg, SpO2 97 %.  PHYSICAL EXAMINATION:  Physical Exam  GENERAL:  67 y.o.-year-old obese patient lying in the bed with no acute distress.  EYES: Pupils equal, round, reactive to light and accommodation. No scleral icterus. Extraocular muscles intact.  HEENT: Head atraumatic, normocephalic.  Oropharynx and nasopharynx clear.  NECK:  Supple, no jugular venous distention. No thyroid enlargement, no tenderness.  LUNGS: Normal breath sounds bilaterally, no wheezing, rales,rhonchi or crepitation. No use of accessory muscles of respiration.  Decreased bibasilar breath sounds CARDIOVASCULAR: S1, S2 normal. No  murmurs, rubs, or gallops.  ABDOMEN: Soft, nontender, nondistended. Bowel sounds present. No organomegaly or mass.  EXTREMITIES: No pedal edema, cyanosis, or clubbing.  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   - Left foot dressing in place. NEUROLOGIC: Cranial nerves II through XII are intact. Muscle strength 5/5 in all extremities. Sensation intact. Gait not checked.  PSYCHIATRIC: The patient is alert and oriented x 3.  SKIN: No obvious rash, lesion, or ulcer.    LABORATORY PANEL:   CBC Recent Labs  Lab 03/08/18 0434  WBC 5.1  HGB 9.4*  HCT 30.1*  PLT 277   ------------------------------------------------------------------------------------------------------------------  Chemistries  Recent Labs  Lab 03/08/18 0434  NA 141  K 3.9  CL 106  CO2 30  GLUCOSE 189*  BUN 17  CREATININE 0.86  CALCIUM 8.5*  MG 2.0   ------------------------------------------------------------------------------------------------------------------  Cardiac Enzymes No results for input(s): TROPONINI in the last 168 hours. ------------------------------------------------------------------------------------------------------------------  RADIOLOGY:  No results found.  EKG:   Orders placed or performed during the hospital encounter of 10/26/15  . EKG 12-Lead  . EKG 12-Lead  . EKG 12-Lead in am (before 8am)  . EKG 12-Lead in am (before 8am)  . EKG    ASSESSMENT AND PLAN:   67 year old male with past medical history significant for diabetes, hypertension, chronic low back pain and CAD resents to hospital secondary to left foot diabetic ulcer  1.  Left foot cellulitis and diabetic foot ulcer-appreciate podiatry consult - left plantar 1st MTP ulcer- s/p I&D and packing done -On vancomycin and meropenem, cultures growing from wound growing enterococcus and E. coli. -Change IV antibiotics to oral based on sensitivities.  ID is following =-X-rays negative for  osteomyelitis -Vernard Gambles will need repeat debridement of the wound after vascular flow is established -partial Weightbearing as tolerated -Also vascular surgery consulted for revascularization-as poor blood flow noted during surgery. -Status post angiogram on 03/05/2018 showing significant occlusions- repeat angiogram on 03/08/2018 with the successful revascularization by Dr. Lucky Cowboy        Percutaneous transluminal angioplasty of left posterior tibial artery with 3 mm diameter by 30 cm length angioplasty balloon and of the tibioperoneal trunk with 4 mm diameter by 10 cm length Lutonix drug-coated angioplasty balloon   Percutaneous transluminal angioplasty of the left popliteal artery and the entire SFA with 2 inflations with a 5 mm diameter by 22 cm length Lutonix drug-coated angioplasty balloon and a proximal inflation with a 6 mm diameter by 22 cm length tonics drug-coated angioplasty balloon           Placement of 2 Viabahn stents in the left SFA and popliteal artery with both being 6 mm diameter by 25 cm length stentswith pedal approach, if that is not successful, patient will need bypass surgery. -Podiatry Dr. Cleda Mccreedy  is planning to do left foot debridement/amputation today - 2.  Diabetes mellitus-sliding scale insulin.  A1c of 8 -Added januvia/tradjenta and lantus  here.  Since sugars are elevated, increase Lantus. -Metformin can be added after discharge as he needs an angiogram and contrast exposure  3.  Chronic back pain-patient on Lyrica, Cymbalta, oxycodone and OxyContin  4.  Hypertension-benazepril and hydrochlorothiazide.  5.  DVT prophylaxis-on Lovenox  6.  Constipation-meds added- relieved  PT recommended SNF at this point.     All the records are reviewed and case discussed with Care Management/Social Workerr. Management plans discussed with the patient, family and they are in agreement.  CODE STATUS: Full code  TOTAL TIME TAKING CARE OF THIS PATIENT: 36 minutes.    POSSIBLE D/C IN 3  DAYS, DEPENDING ON CLINICAL CONDITION.   Nicholes Mango M.D on 03/08/2018 at 1:58 PM  Between 7am to 6pm - Pager - [phone]   After 6pm go to [email or web address] - password [email or web address] [facility]  Office  [phone]  CC: Primary care physician; Lavera Guise, MD

## 2018-03-08 NOTE — Progress Notes (Signed)
Patient ID: Victor Wise, male   DOB: Nov 05, 1950, 67 y.o.   MRN: [id number] Patient seen.  Successful revascularization by Dr. Lucky Cowboy.  Spoke with the patient about planning for the debridement of the exposed bone and ulceration on his left foot.  At this point we will plan for surgery this evening and he will remain n.p.o.  Consent form for debridement of bone and soft tissue left foot.  Discussed with the patient to the possible risks and complications of the procedure including inability to heal due to infection or his diabetes or vascular disease.  No guarantees could be given as to the outcome.  Discussed possible need for further debridement and/or amputation.  Elects to proceed with surgery scheduled for tonight.

## 2018-03-08 NOTE — Anesthesia Procedure Notes (Signed)
Procedure Name: Intubation Date/Time: 03/08/2018 5:50 PM Performed by: Aline Brochure, CRNA Pre-anesthesia Checklist: Patient identified, Emergency Drugs available, Suction available and Patient being monitored Patient Re-evaluated:Patient Re-evaluated prior to induction Oxygen Delivery Method: Circle system utilized Preoxygenation: Pre-oxygenation with 100% oxygen Induction Type: IV induction Ventilation: Oral airway inserted - appropriate to patient size and Mask ventilation without difficulty Laryngoscope Size: McGraph and 4 Grade View: Grade I Tube type: Oral Tube size: 7.5 mm Number of attempts: 1 Airway Equipment and Method: Stylet and Video-laryngoscopy Placement Confirmation: ETT inserted through vocal cords under direct vision,  positive ETCO2 and breath sounds checked- equal and bilateral Secured at: 22 cm Tube secured with: Tape Dental Injury: Teeth and Oropharynx as per pre-operative assessment  Difficulty Due To: Difficult Airway- due to dentition

## 2018-03-08 NOTE — Progress Notes (Signed)
Pharmacy Antibiotic Note  CHRISTHOPER BUSBEE is a 67 y.o. male admitted on 03/01/2018 with wound infection.  Pharmacy has been consulted for Vancomycin dosing.  Plan: 10/24 VT 20 mcg/mL. Will continue current dose. Renal function stable will continue to monitor.  Height: 5\' 8"  (172.7 cm) Weight: 293 lb 14 oz (133.3 kg) IBW/kg (Calculated) : 68.4  Temp (24hrs), Avg:98.1 F (36.7 C), Min:97.7 F (36.5 C), Max:98.9 F (37.2 C)  Recent Labs  Lab 03/01/18 1514 03/02/18 0435  03/03/18 0726 03/06/18 0411 03/07/18 0419 03/08/18 0434 03/08/18 0738  WBC 9.1 7.2  --  6.4  --   --  5.1  --   CREATININE 0.91 0.86  --  1.05 0.86 0.88 0.86  --   VANCOTROUGH  --   --    < > 17  --  17  --  20   < > = values in this interval not displayed.    Estimated Creatinine Clearance: 112.8 mL/min (by C-G formula based on SCr of 0.86 mg/dL).    Allergies  Allergen Reactions  . Amoxicillin Other (See Comments)    Has never taken amoxicillin -? amoxicillin skin test Has patient had a PCN reaction causing immediate rash, facial/tongue/throat swelling, SOB or lightheadedness with hypotension: No Has patient had a PCN reaction causing severe rash involving mucus membranes or skin necrosis: No Has patient had a PCN reaction that required hospitalization No Has patient had a PCN reaction occurring within the last 10 years: No If above answers are "NO", then may proceed w/ Cephalo  . Other Anaphylaxis, Itching and Other (See Comments)    Pt states that he is allergic to Endopa.  Reaction:  Anaphylaxis  Pt states that he is allergic to Metabisulfites Reaction:  Itching   . Penicillins Other (See Comments)    Arm turned "blue" at injection site (no treatment needed) Has patient had a PCN reaction causing immediate rash, facial/tongue/throat swelling, SOB or lightheadedness with hypotension: No Has patient had a PCN reaction causing severe rash involving mucus membranes or skin necrosis: No Has patient had a PCN  reaction that required hospitalization No Has patient had a PCN reaction occurring within the last 10 years: No If all of the above answers are "NO", then may proceed with Cephalosporin use.  [name] Hydralazine Other (See Comments)    Reaction:  Cramping of extremities  . Crestor [Rosuvastatin Calcium] Other (See Comments)    Reaction:  Pt is unable to move arms/legs   . Metoprolol Nausea And Vomiting  . Red Dye Itching  . Statins Other (See Comments)    Reaction:  Pt is unable to move arms/legs  . Sulfa Antibiotics Itching    Other reaction(s): Unknown  . Yellow Dyes (Non-Tartrazine) Itching  . Aspirin Itching and Other (See Comments)    Pt states that he is able to use in lower doses.    . Tape Rash    Please use "paper" tape only. Please use "paper" tape only.     Paulina Fusi, PharmD, BCPS 03/08/2018 1:45 PM

## 2018-03-08 NOTE — Op Note (Signed)
Date of operation: 03/08/2018.  Surgeon: Durward Fortes D.P.M.  Preoperative diagnosis: Ulceration with exposed bone left first metatarsal.  Postoperative diagnosis: Same.  Procedure: Debridement soft tissue and bone left first metatarsal.  Anesthesia: General endotracheal.  Hemostasis: None.  Estimated blood loss: 50 cc.  Pathology: Left great toe joint.  Implants: Stimulan rapid cure antibiotic beads impregnated with vancomycin.  Complications: None apparent.  Operative indications: This is a 67 year old male with recent admission with gas abscess at his left great toe joint.  Underwent I&D this past week and was found to had limited bleeding.  Subsequently was revascularized by vascular surgery and deemed stable for debridement of exposed bone on his left foot.  Operative procedure: Patient was taken to the operating room and placed on the table in the supine position.  Following satisfactory general endotracheal anesthesia the left foot was anesthetized with 10 cc of 0.5% bupivacaine plain.  The left foot was then prepped and draped in the usual sterile fashion.    Attention was then directed to the previous incision on the medial aspect of the left great toe joint and the sutures were removed and the incision opened up.  Some devitalized tissue was noted around the central aspect of the incision as well as plantarly at the previous ulcerative areas.  The first metatarsal head was noted to be putting pressure on the incision area.  Soft tissues were freed off of the head of the first metatarsal and base of the proximal phalanx and using a sagittal saw the distal aspect of the head of the first metatarsal and base of the proximal phalanx were resected in toto.  Fibular sesamoid and flexor tendon were still noted to be intact and these were left in place as they appeared in good condition.  Some of the capsular tissue was then sharply excised using a Beaver blade followed by some of the  necrotic skin edge dorsally.  The wound was then thoroughly irrigated and debrided with a versa jet debrider on a setting of 3.  The proximal and distal aspects of the incision were closed using 3-0 nylon figure-of-eight and simple interrupted sutures as well as the plantar ulceration.  Wound was then flushed with copious amounts of sterile saline and packed with Stimulan rapid cure antibiotic beads impregnated with vancomycin and the remainder of the incision was reapproximated.  Xeroform 4 x 4's ABDs and Kerlix applied to the left lower extremity followed by an Ace wrap.  Patient tolerated the procedure and anesthesia well and was awakened and transported to the PACU with vital signs stable and in good condition.

## 2018-03-08 NOTE — Anesthesia Post-op Follow-up Note (Signed)
Anesthesia QCDR form completed.        

## 2018-03-08 NOTE — H&P (Signed)
[facility] History & Physical Update  The patient was interviewed and re-examined.  The patient's previous History and Physical has been reviewed and is unchanged.  There is no change in the plan of care. We plan to proceed with the scheduled procedure.  Victor Pain, MD  03/08/2018, 10:10 AM

## 2018-03-08 NOTE — Anesthesia Postprocedure Evaluation (Signed)
Anesthesia Post Note  Patient: Victor Wise  Procedure(s) Performed: IRRIGATION AND DEBRIDEMENT FOOT AND BONE (Left )  Patient location during evaluation: PACU Anesthesia Type: General Level of consciousness: awake and alert Pain management: pain level controlled Vital Signs Assessment: post-procedure vital signs reviewed and stable Respiratory status: spontaneous breathing, nonlabored ventilation, respiratory function stable and patient connected to nasal cannula oxygen Cardiovascular status: blood pressure returned to baseline and stable Postop Assessment: no apparent nausea or vomiting Anesthetic complications: no     Last Vitals:  Vitals:   03/08/18 2017 03/08/18 2121  BP: 124/63 (!) 132/59  Pulse: (!) 58 60  Resp: 19 18  Temp: 36.8 C 36.8 C  SpO2: 93% 93%    Last Pain:  Vitals:   03/08/18 2121  TempSrc: Oral  PainSc:                  Martha Clan

## 2018-03-08 NOTE — H&P (View-Only) (Signed)
Patient ID: Victor Wise, male   DOB: October 25, 1950, 67 y.o.   MRN: [id number] Patient seen.  Successful revascularization by Dr. Lucky Cowboy.  Spoke with the patient about planning for the debridement of the exposed bone and ulceration on his left foot.  At this point we will plan for surgery this evening and he will remain n.p.o.  Consent form for debridement of bone and soft tissue left foot.  Discussed with the patient to the possible risks and complications of the procedure including inability to heal due to infection or his diabetes or vascular disease.  No guarantees could be given as to the outcome.  Discussed possible need for further debridement and/or amputation.  Elects to proceed with surgery scheduled for tonight.

## 2018-03-08 NOTE — Op Note (Signed)
[facility]  Percutaneous Study/Intervention Procedural Note   Date of Surgery: 03/08/2018  Surgeon(s):Miquela Costabile    Assistants:none  Pre-operative Diagnosis: PAD with ulceration and infection left foot  Post-operative diagnosis:  Same  Procedure(s) Performed:             1.  Ultrasound guidance for vascular access right femoral artery and left posterior tibial artery              2.  Catheter placement into left posterior tibial artery from right femoral approach and into the left common femoral artery from the left posterior tibial approach             3.  Selective left lower extremity angiogram             4.  Percutaneous transluminal angioplasty of left posterior tibial artery with 3 mm diameter by 30 cm length angioplasty balloon and of the tibioperoneal trunk with 4 mm diameter by 10 cm length Lutonix drug-coated angioplasty balloon             5.   Percutaneous transluminal angioplasty of the left popliteal artery and the entire SFA with 2 inflations with a 5 mm diameter by 22 cm length Lutonix drug-coated angioplasty balloon and a proximal inflation with a 6 mm diameter by 22 cm length tonics drug-coated angioplasty balloon  6.  Placement of 2 Viabahn stents in the left SFA and popliteal artery with both being 6 mm diameter by 25 cm length stents             7.  StarClose closure device right femoral artery  EBL: 15 cc  Contrast: 110 cc  Fluoro Time: 18.9 minutes  Moderate Conscious Sedation Time: approximately 60 minutes using 4 mg of Versed and 150 Mcg of Fentanyl              Indications:  Patient is a 67 y.o.male with ulceration and infection of the left foot and severe peripheral arterial disease. The patient has had an angiogram earlier this week showing a long SFA popliteal, and tibial occlusion. The patient is brought in for angiography for further evaluation and potential treatment.  Due to the limb threatening nature of the situation,  angiogram was performed for attempted limb salvage. The patient is aware that if the procedure fails, amputation would be expected.  The patient also understands that even with successful revascularization, amputation may still be required due to the severity of the situation.  Risks and benefits are discussed and informed consent is obtained.   Procedure:  The patient was identified and appropriate procedural time out was performed.  The patient was then placed supine on the table and prepped and draped in the usual sterile fashion. Moderate conscious sedation was administered during a face to face encounter with the patient throughout the procedure with my supervision of the RN administering medicines and monitoring the patient's vital signs, pulse oximetry, telemetry and mental status throughout from the start of the procedure until the patient was taken to the recovery room.  Ultrasound was used to evaluate the left posterior tibial artery and this was accessed under direct ultrasound guidance without difficulty with a micropuncture needle.  A micropuncture wire and sheath were then placed and we upsized to a 5 French slender sheath.  Imaging showed occlusion of the proximal posterior tibial artery.  I then used a 0.018 and 0.035 advantage wire and was able to get up across the occlusion and up into  the SFA and common femoral artery but could never regain intraluminal access at that point.  Kumpe and CXI catheters were used.  At this point, I elected to access the right femoral artery.  Ultrasound was used to evaluate the right common femoral artery.  It was patent .  A digital ultrasound image was acquired.  A Seldinger needle was used to access the right common femoral artery under direct ultrasound guidance and a permanent image was performed.  A 0.035 J wire was advanced without resistance and a 5Fr sheath was placed. I then crossed the aortic bifurcation and advanced to the left femoral head. Selective  left lower extremity angiogram was then performed. This demonstrated the known long segment occlusion of the left SFA, popliteal artery, tibioperoneal trunk, and all 3 tibial arteries with reconstitution of the posterior tibial artery distally. It was felt that it was in the patient's best interest to proceed with intervention after these images to avoid a second procedure and a larger amount of contrast and fluoroscopy based off of the findings from the initial angiogram. The patient was systemically heparinized and a 6 [place] Ansell sheath was then placed over the [employer] wire. I then used a Kumpe catheter and the advantage wire to navigate down into the SFA.  Once again, we seem to be in a subintimal plane but at this point I elected to snare a Glidewire from the left posterior tibial approach within the SFA.  A small incision was placed in the SFA and it was snared and the wire was brought out the right femoral sheath.  We then exchanged with a long 135 CXI catheter and took this down into the posterior tibial artery.  The posterior tibial sheath was removed and a wire was parked in the foot.  A 3 mm diameter by 30 cm length angioplasty balloon was inflated from the access site of the posterior tibial artery at the ankle all the way up to its origin and a second inflation was taken up through the tibioperoneal trunk and below-knee popliteal artery.  These inflations were 8 atm for 1 minute.  I then treated the entire SFA and popliteal artery with 2 inflations with a 5 mm diameter by 22 cm length Lutonix drug-coated angioplasty balloon and a proximal inflation of the proximal SFA with a 6 mm diameter by 22 cm length Lutonix drug-coated angioplasty balloon.  Each inflation was 10 to 12 atm for 1 minute.  Completion imaging showed long segment high-grade residual stenosis and subintimal plane in the left SFA and above-knee popliteal artery.  The tibioperoneal trunk had greater than 50% residual stenosis  but the remainder of the posterior tibial artery looked good with no greater than 20% residual stenosis.  The tibioperoneal trunk was treated with a 4 mm diameter by 10 cm length Lutonix drug-coated angioplasty balloon inflated to 10 atm for 1 minute.  2 Viabahn stents were placed from the below-knee popliteal artery up to the proximal SFA and these were both 6 mm in diameter by 25 cm in length.  These were postdilated with 6 mm diameter high-pressure balloons with about a 15 to 20% residual stenosis at Hunter's canal and no other significant stenosis identified.  The tibioperoneal trunk had only about a 15 to 20% residual stenosis as well.  There was good flow distally.  I elected to terminate the procedure. The sheath was removed and StarClose closure device was deployed in the right femoral artery with excellent hemostatic result. The patient  was taken to the recovery room in stable condition having tolerated the procedure well.  Findings:                       Left Lower Extremity:  This demonstrated the known long segment occlusion of the left SFA, popliteal artery, tibioperoneal trunk, and all 3 tibial arteries with reconstitution of the posterior tibial artery distally.   Disposition: Patient was taken to the recovery room in stable condition having tolerated the procedure well.  Complications: None  Leotis Pain 03/08/2018 12:10 PM   This note was created with Dragon Medical transcription system. Any errors in dictation are purely unintentional.

## 2018-03-08 NOTE — Transfer of Care (Signed)
Immediate Anesthesia Transfer of Care Note  Patient: Victor Wise  Procedure(s) Performed: IRRIGATION AND DEBRIDEMENT FOOT AND BONE (Left )  Patient Location: PACU  Anesthesia Type:General  Level of Consciousness: sedated  Airway & Oxygen Therapy: Patient connected to face mask oxygen  Post-op Assessment: Post -op Vital signs reviewed and stable  Post vital signs: stable  Last Vitals:  Vitals Value Taken Time  BP    Temp    Pulse 64 03/08/2018  6:54 PM  Resp 12 03/08/2018  6:54 PM  SpO2 100 % 03/08/2018  6:54 PM  Vitals shown include unvalidated device data.  Last Pain:  Vitals:   03/08/18 1548  TempSrc: Oral  PainSc:       Patients Stated Pain Goal: 0 (08/56/94 3700)  Complications: No apparent anesthesia complications

## 2018-03-08 NOTE — Progress Notes (Signed)
PT Cancellation Note  Patient Details Name: Victor Wise MRN: [id number] DOB: 11-25-1950   Cancelled Treatment:    Reason Eval/Treat Not Completed: Patient at procedure or test/unavailable   Pt awaiting transport for procedure this am.  RN in room and stated the transport tech was on his way to get pt.  Will continue as appropriate.   Chesley Noon 03/08/2018, 9:12 AM

## 2018-03-08 NOTE — Progress Notes (Signed)
Per RN patient will not D/C today. Clinical Education officer, museum (CSW) made [facility] liaison aware of above. Per Otila Kluver she will have to re-start [facility] authorization because it expired today.   [employer], LCSW [phone]

## 2018-03-09 ENCOUNTER — Encounter: Payer: Self-pay | Admitting: Vascular Surgery

## 2018-03-09 LAB — BASIC METABOLIC PANEL
Anion gap: 8 (ref 5–15)
BUN: 17 mg/dL (ref 8–23)
CO2: 27 mmol/L (ref 22–32)
CREATININE: 0.77 mg/dL (ref 0.61–1.24)
Calcium: 8 mg/dL — ABNORMAL LOW (ref 8.9–10.3)
Chloride: 108 mmol/L (ref 98–111)
GFR calc Af Amer: >60 mL/min (ref >60)
GFR calc non Af Amer: >60 mL/min (ref >60)
Glucose, Bld: 155 mg/dL — ABNORMAL HIGH (ref 70–99)
POTASSIUM: 3.6 mmol/L (ref 3.5–5.1)
Sodium: 143 mmol/L (ref 135–145)

## 2018-03-09 LAB — CBC
HEMATOCRIT: 28.4 % — AB (ref 39.0–52.0)
HEMOGLOBIN: 8.7 g/dL — AB (ref 13.0–17.0)
MCH: 24.9 pg — AB (ref 26.0–34.0)
MCHC: 30.6 g/dL (ref 30.0–36.0)
MCV: 81.4 fL (ref 80.0–100.0)
Platelets: 269 10*3/uL (ref 150–400)
RBC: 3.49 MIL/uL — AB (ref 4.22–5.81)
RDW: 16.2 % — ABNORMAL HIGH (ref 11.5–15.5)
WBC: 5.2 10*3/uL (ref 4.0–10.5)
nRBC: 0 % (ref 0.0–0.2)

## 2018-03-09 LAB — GLUCOSE, CAPILLARY
GLUCOSE-CAPILLARY: 139 mg/dL — AB (ref 70–99)
GLUCOSE-CAPILLARY: 171 mg/dL — AB (ref 70–99)
Glucose-Capillary: 197 mg/dL — ABNORMAL HIGH (ref 70–99)
Glucose-Capillary: 194 mg/dL — ABNORMAL HIGH (ref 70–99)

## 2018-03-09 MED ORDER — VANCOMYCIN HCL 10 G IV SOLR
1500.0000 mg | Freq: Two times a day (BID) | INTRAVENOUS | Status: DC
  Administered 2018-03-09 – 2018-03-10 (×2): 1500 mg via INTRAVENOUS
  Filled 2018-03-09 (×3): qty 1500

## 2018-03-09 NOTE — Progress Notes (Signed)
[facility] NAME: Victor Wise    MR#:  [id number]  DATE OF BIRTH:  07-31-1950  SUBJECTIVE:  CHIEF COMPLAINT:   Chief Complaint  Patient presents with  . Wound Infection  . Foot Pain   - left foot wound. S/p deep I&D -Had angiogram, significant occlusions noted, had angioplasty and stent placement yesterday followed by wound debridement of the foot by podiatry -Patient is resting comfortably and calf pain improved  REVIEW OF SYSTEMS:  Review of Systems  Constitutional: Negative for chills and fever.  HENT: Negative for ear pain, hearing loss and tinnitus.   Eyes: Negative for blurred vision and double vision.  Respiratory: Negative for cough, shortness of breath and wheezing.   Cardiovascular: Positive for leg swelling. Negative for chest pain and palpitations.  Gastrointestinal: Negative for abdominal pain, constipation, diarrhea, nausea and vomiting.  Genitourinary: Negative for dysuria.  Musculoskeletal: Negative for myalgias.  Skin: Negative for rash.  Neurological: Positive for weakness. Negative for dizziness, focal weakness, seizures and headaches.  Psychiatric/Behavioral: Negative for depression.    DRUG ALLERGIES:   Allergies  Allergen Reactions  . Amoxicillin Other (See Comments)    Has never taken amoxicillin -ENT told him not to take (? Had skin test) Has patient had a PCN reaction causing immediate rash, facial/tongue/throat swelling, SOB or lightheadedness with hypotension: No Has patient had a PCN reaction causing severe rash involving mucus membranes or skin necrosis: No Has patient had a PCN reaction that required hospitalization No Has patient had a PCN reaction occurring within the last 10 years: No If above answers are "NO", then may proceed w/ Cephalo  . Other Anaphylaxis, Itching and Other (See Comments)    Pt states that he is allergic to Endopa.  Reaction:  Anaphylaxis  Pt states that he is  allergic to Metabisulfites Reaction:  Itching   . Penicillins Other (See Comments)    Arm turned "blue" at injection site (no treatment needed) Has patient had a PCN reaction causing immediate rash, facial/tongue/throat swelling, SOB or lightheadedness with hypotension: No Has patient had a PCN reaction causing severe rash involving mucus membranes or skin necrosis: No Has patient had a PCN reaction that required hospitalization No Has patient had a PCN reaction occurring within the last 10 years: No If all of the above answers are "NO", then may proceed with Cephalosporin use.  [name] Hydralazine Other (See Comments)    Reaction:  Cramping of extremities  . Crestor [Rosuvastatin Calcium] Other (See Comments)    Reaction:  Pt is unable to move arms/legs   . Metoprolol Nausea And Vomiting  . Red Dye Itching  . Statins Other (See Comments)    Reaction:  Pt is unable to move arms/legs  . Sulfa Antibiotics Itching    Other reaction(s): Unknown  . Yellow Dyes (Non-Tartrazine) Itching  . Aspirin Itching and Other (See Comments)    Pt states that he is able to use in lower doses.    . Tape Rash    Please use "paper" tape only. Please use "paper" tape only.    VITALS:  Blood pressure 132/64, pulse 60, temperature 98.3 F (36.8 C), temperature source Oral, resp. rate 20, height 5\' 8"  (1.727 m), weight 133.3 kg, SpO2 96 %.  PHYSICAL EXAMINATION:  Physical Exam  GENERAL:  67 y.o.-year-old obese patient lying in the bed with no acute distress.  EYES: Pupils equal, round, reactive to light and accommodation. No scleral icterus. Extraocular  muscles intact.  HEENT: Head atraumatic, normocephalic. Oropharynx and nasopharynx clear.  NECK:  Supple, no jugular venous distention. No thyroid enlargement, no tenderness.  LUNGS: Normal breath sounds bilaterally, no wheezing, rales,rhonchi or crepitation. No use of accessory muscles of respiration.  Decreased bibasilar breath sounds CARDIOVASCULAR: S1, S2  normal. No murmurs, rubs, or gallops.  ABDOMEN: Soft, nontender, nondistended. Bowel sounds present. No organomegaly or mass.  EXTREMITIES: No pedal edema, cyanosis, or clubbing.  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   - Left foot dressing in place. NEUROLOGIC: Cranial nerves II through XII are intact. Muscle strength 5/5 in all extremities. Sensation intact. Gait not checked.  PSYCHIATRIC: The patient is alert and oriented x 3.  SKIN: No obvious rash, lesion, or ulcer.    LABORATORY PANEL:   CBC Recent Labs  Lab 03/09/18 0441  WBC 5.2  HGB 8.7*  HCT 28.4*  PLT 269   ------------------------------------------------------------------------------------------------------------------  Chemistries  Recent Labs  Lab 03/08/18 0434 03/09/18 0441  NA 141 143  K 3.9 3.6  CL 106 108  CO2 30 27  GLUCOSE 189* 155*  BUN 17 17  CREATININE 0.86 0.77  CALCIUM 8.5* 8.0*  MG 2.0  --    ------------------------------------------------------------------------------------------------------------------  Cardiac Enzymes No results for input(s): TROPONINI in the last 168 hours. ------------------------------------------------------------------------------------------------------------------  RADIOLOGY:  No results found.  EKG:   Orders placed or performed during the hospital encounter of 10/26/15  . EKG 12-Lead  . EKG 12-Lead  . EKG 12-Lead in am (before 8am)  . EKG 12-Lead in am (before 8am)  . EKG    ASSESSMENT AND PLAN:   67 year old male with past medical history significant for diabetes, hypertension, chronic low back pain and CAD resents to hospital secondary to left foot diabetic ulcer  1.  Left foot cellulitis and diabetic foot ulcer-appreciate podiatry consult - left plantar 1st MTP ulcer- s/p I&D and packing done -On vancomycin  rocephin, flagyl cultures growing from wound growing enterococcus and E. coli. -  ID is following =-X-rays negative for  osteomyelitis -Also vascular surgery consulted for revascularization-as poor blood flow noted during surgery. -Status post angiogram on 03/05/2018 showing significant occlusions- repeat angiogram on 03/08/2018 with the successful revascularization by Dr. Lucky Cowboy        Percutaneous transluminal angioplasty of left posterior tibial artery with 3 mm diameter by 30 cm length angioplasty balloon and of the tibioperoneal trunk with 4 mm diameter by 10 cm length Lutonix drug-coated angioplasty balloon   Percutaneous transluminal angioplasty of the left popliteal artery and the entire SFA with 2 inflations with a 5 mm diameter by 22 cm length Lutonix drug-coated angioplasty balloon and a proximal inflation with a 6 mm diameter by 22 cm length tonics drug-coated angioplasty balloon           Placement of 2 Viabahn stents in the left SFA and popliteal artery with both being 6 mm diameter by 25 cm length stentswith pedal approach, if that is not successful, patient will need bypass surgery. -Podiatry Dr. Cleda Mccreedy did debridement 03/08/2018 -ID is following - 2.  Diabetes mellitus-sliding scale insulin.  A1c of 8 -Added januvia/tradjenta and lantus  here.  Since sugars are elevated, increase Lantus. -Metformin can be added after discharge as he needs an angiogram and contrast exposure  3.  Chronic back pain-patient on Lyrica, Cymbalta, oxycodone and OxyContin  4.  Hypertension-benazepril and hydrochlorothiazide.  5.  Acute on chronic depression psychiatry consult placed  6.  Constipation-meds added-  relieved  PT recommended SNF at this point.  Repeat PT consult placed Reassessment is needed which is pending at this time   All the records are reviewed and case discussed with Care Management/Social Workerr. Management plans discussed with the patient, family and they are in agreement.  CODE STATUS: Full code  TOTAL TIME TAKING CARE OF THIS PATIENT: 36 minutes.   POSSIBLE D/C IN 2  DAYS, DEPENDING  ON CLINICAL CONDITION.   Nicholes Mango M.D on 03/09/2018 at 4:34 PM  Between 7am to 6pm - Pager - [phone]   After 6pm go to [email or web address] - password [email or web address] [facility]  Office  [phone]  CC: Primary care physician; Lavera Guise, MD

## 2018-03-09 NOTE — Progress Notes (Signed)
Victor Wise is a 67 y.o. male with a history of diabetes mellitus, chronic back pain, spine stimulator in place Pacemaker,BPH was admitted to the hospital with left foot wound on 03/01/2018.  According to the patient has had chronic ulceration to the bottom of his left foot for a few weeks.  He has been applying topical ointment and was doing local wound care..  But for the past 2 to 3 days ulceration has become painful and the foot a has become red and swollen.  He also noted a foul odor from the wound. He did not note any temperature. He had some diarrhea and vomiting for a few days He has a history of right great toe amputation 3 years ago. On admission his temperature was 97.8, BP of 153/69, heart rate of 61.  WBC was 9.1, Hb 12.3, PLT 276.  Creatinine was 0.91.  Blood cultures were sent.  He had a x-ray of the left foot which showed ulceration of the ball of the foot underlying the MTP joint of the great toe.  Air and gas in the soft tissue was seen.  He was started on broad-spectrum antibiotics with vancomycin and meropenem.  He underwent surgery by podiatrist on 03/02/2018.  He had excision of the infected sesamoid plantar left first MTP joint and drainage of the abscess.  Cultures were taken from the surgical site and it revealed enterococcus faecalis along with moderate Bacteroides vulgatus and E. coli. He was seen by vascular surgeon and had a aortogram and and she of the left lower extremity.  This demonstrated occlusion of the superficial femoral artery and all the 3 tibial vessels were also occluded proximally.  No angioplasty was possible.  Percutaneous revascularization was aborted. I am asked to see him for antibiotic recommendation as he has penicillin allergy.  Subjective Feeling better Underwent revascularization left lower extremity with 2 stent placement. Also had 1st met head removal left  last night  Objective:  VITALS:  BP 132/64 (BP Location: Left Arm)   Pulse 60   Temp  98.3 F (36.8 C) (Oral)   Resp 20   Ht 5' 8" (1.727 m)   Wt 133.3 kg   SpO2 96%   BMI 44.68 kg/m  PHYSICAL EXAM:  General: Alert, cooperative, no distress, appears stated age.  Obese, pale Lungs: Bilateral air entry. Heart: S1-S2, pacemaker site clean Abdomen: Soft, obese. Bowel sounds normal. No masses Extremities: Right great toe amputated Right second toe hammertoe with some bruising Left foot: surgical dressing not removed   From 10/23    Neurologic: resting tremors rt hand pertinent Labs CBC Latest Ref Rng & Units 03/09/2018 03/08/2018 03/03/2018  WBC 4.0 - 10.5 K/uL 5.2 5.1 6.4  Hemoglobin 13.0 - 17.0 g/dL 8.7(L) 9.4(L) 10.5(L)  Hematocrit 39.0 - 52.0 % 28.4(L) 30.1(L) 33.7(L)  Platelets 150 - 400 K/uL 269 277 225   CMP Latest Ref Rng & Units 03/09/2018 03/08/2018 03/07/2018  Glucose 70 - 99 mg/dL 155(H) 189(H) 244(H)  BUN 8 - 23 mg/dL _0 Creatinine 0.61 - 1.24 mg/dL 0.77 0.86 0.88  Sodium 135 - 145 mmol/L 143 141 141  Potassium 3.5 - 5.1 mmol/L 3.6 3.9 3.6  Chloride 98 - 111 mmol/L 108 106 106  CO2 22 - 32 mmol/L _1 Calcium 8.9 - 10.3 mg/dL 8.0(L) 8.5(L) 8.5(L)  Total Protein 6.5 - 8.1 g/dL - - -  Total Bilirubin 0.3 - 1.2 mg/dL - - -  Alkaline Phos 38 -  126 U/L - - -  AST 15 - 41 U/L - - -  ALT 17 - 63 U/L - - -    IMAGING RESULTS: ? Impression/Recommendation ?68 y.o. male with a history of diabetes mellitus, chronic back pain, spine stimulator in place Pacemaker,BPH was admitted to the hospital with left foot wound on 03/01/2018.  According to the patient has had chronic ulceration to the bottom of his left foot for a few weeks.  He has been applying topical ointment and was doing local wound care..  But for the past 2 to 3 days ulceration has become painful and the foot a has become red and swollen.  He also noted a foul odor from the wound.   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  S/Pexcision of the infected sesamoid plantar left first  MTP joint and drainage of the abscess.  Polymicrobial infection with E. coli, enterococcus and Bacteroides in the culture.  Currently on vancomycin and ceftriaxone. Underwent further debridement and met head resection last night spoke to [place] podiatrist . He thinks this is a therapeutic surgery and he will not need Intravenous antibiotics to go home with.He will evaluate the foot tomorrow and decide So the oral option on discharge will be omnicef 381m BID  and flagyl 5020mTID  History of penicillin allergy.  Also said that he was tested for amoxicillin and and was proven to be positive.  No anaphylaxis. So we can give cephalosporin. ? Peripheral arterial disease left side with occlusion of the superficial femoral artery and all 3 tibial vessels revascularization with stent placement   History of CAD with with MI.  H/o stents and on Plavix and aspirin  Spine stimulator in place for back pain  Pacemaker in place.  __________________________________________________ Discussed with patient in detail. ID will not see him this weekend- call if needed

## 2018-03-09 NOTE — Progress Notes (Signed)
PT Cancellation Note  Patient Details Name: Victor Wise MRN: [id number] DOB: 06-17-1950   Cancelled Treatment:    Reason Eval/Treat Not Completed: Patient declined, no reason specified.  Patient refused, stating that he is in pain, has had two surgeries and did not sleep last night.  PT offered to assist pt to chair in order for his bedding to be changed and to sit up to eat and pt stated that he will not get up today and that he is not interested in eating right now either.  He stated that the NA can "change the bedding tomorrow".  PT educated pt regarding importance of movement following surgery and benefit of body systems and pt expressed understanding but disinterest, requesting that PT return tomorrow.  Will re-attempt later when appropriate.   Roxanne Gates., PT, DPT 03/09/2018, 12:09 PM

## 2018-03-09 NOTE — Progress Notes (Signed)
Clinical Education officer, museum (CSW) met with patient alone at bedside to discuss D/C plan. Patient reported that he "doesn't care about anything anymore" and wants to D/C home. Per patient he no longer wants to go to [facility] for rehab or work with PT. CSW asked patient how he would take care of his daily needs at home and he stated he would figure out a way. Per patient he has a daughter in [place] and several friends in the area. CSW encouraged patient to work with PT and consider going to SNF. CSW explained that PT will help him safely ambulate. Patient reported the he can walk out of this hospital if he wants to, continued to refuse SNF and stated that he is going home. Patient requested home health. RN case manager aware of above. CSW requested MD to order psych consult for depression. [facility] liaison is aware of above and will pursue [facility] SNF authorization. CSW will continue to follow and assist as needed.   [employer], LCSW [phone]

## 2018-03-09 NOTE — Care Management Important Message (Signed)
Important Message  Patient Details  Name: Victor Wise MRN: [id number] Date of Birth: 08-Jan-1951   Medicare Important Message Given:  Yes    Juliann Pulse A Samiksha Pellicano 03/09/2018, 10:46 AM

## 2018-03-09 NOTE — Progress Notes (Signed)
[facility]  Daily Progress Note   Subjective: 1 Day Post-Op: Ultrasound guidance for vascular access right femoral artery and left posterior tibial artery, Catheter placement into left posterior tibial artery from right femoral approach and into the left common femoral artery from the left posterior tibial approach, Selective left lower extremity angiogram, Percutaneous transluminal angioplasty of left posterior tibial artery with 3 mm diameter by 30 cm length angioplasty balloon and of the tibioperoneal trunk with 4 mm diameter by 10 cm length Lutonix drug-coated angioplasty balloon, Percutaneous transluminal angioplasty of the left popliteal artery and the entire SFA with 2 inflations with a 5 mm diameter by 22 cm length Lutonix drug-coated angioplasty balloon and a proximal inflation with a 6 mm diameter by 22 cm length tonics drug-coated angioplasty balloon, placement of 2 Viabahn stents in the left SFA and popliteal artery with both being 6 mm diameter by 25 cm length stents with StarClose closure device right femoral artery.  Patient with left lower extremity "swelling" and discomfort.  Patient with left foot discomfort.  No issues overnight.  Objective: Vitals:   03/08/18 2316 03/09/18 0015 03/09/18 0410 03/09/18 0828  BP: 122/63 125/61 125/66 132/64  Pulse: (!) 59 (!) 59 (!) 59 60  Resp: 19 20 19 20   Temp: 98 F (36.7 C) 98.4 F (36.9 C) 97.7 F (36.5 C) 98.3 F (36.8 C)  TempSrc: Oral Oral Oral Oral  SpO2: 93% 96% 96% 96%  Weight:      Height:        Intake/Output Summary (Last 24 hours) at 03/09/2018 1101 Last data filed at 03/09/2018 1035 Gross per 24 hour  Intake 2202.6 ml  Output 875 ml  Net 1327.6 ml   Physical Exam: A&Ox3, NAD CV: RRR Pulmonary: CTA Bilaterally Abdomen: Soft, Nontender, Nondistended Vascular:  Left lower extremity: Thigh soft, calf soft.  Extremity is warm all the way down to what I can  examine outside of podiatry OR  dressing - which I did not remove.  Mild to moderate  edema to the leg.  Skin is intact.  No cellulitis.   Laboratory: CBC    Component Value Date/Time   WBC 5.2 03/09/2018 0441   HGB 8.7 (L) 03/09/2018 0441   HGB 11.6 (L) 02/06/2014 1628   HCT 28.4 (L) 03/09/2018 0441   HCT 35.9 (L) 02/06/2014 1628   PLT 269 03/09/2018 0441   PLT 221 02/06/2014 1628   BMET    Component Value Date/Time   NA 143 03/09/2018 0441   NA 138 02/06/2014 1628   K 3.6 03/09/2018 0441   K 4.6 02/06/2014 1628   CL 108 03/09/2018 0441   CL 102 02/06/2014 1628   CO2 27 03/09/2018 0441   CO2 30 02/06/2014 1628   GLUCOSE 155 (H) 03/09/2018 0441   GLUCOSE 139 (H) 02/06/2014 1628   BUN 17 03/09/2018 0441   BUN 31 (H) 02/06/2014 1628   CREATININE 0.77 03/09/2018 0441   CREATININE 1.16 02/06/2014 1628   CALCIUM 8.0 (L) 03/09/2018 0441   CALCIUM 8.6 02/06/2014 1628   GFRNONAA >60 03/09/2018 0441   GFRNONAA >60 02/06/2014 1628   GFRNONAA >60 12/18/2013 0435   GFRAA >60 03/09/2018 0441   GFRAA >60 02/06/2014 1628   GFRAA >60 12/18/2013 0435   Assessment/Planning: The patient is a 67 year old male with past medical history of peripheral artery disease status post a left lower extremity endovascular intervention which successfully restored arterial blood flow - Stable 1) the patient is now  status post a successful left lower extremity angiogram 2) patient to follow-up with [place] as an outpatient 3) discussed the signs and symptoms of reperfusion syndrome with the patient  Discussed with Dr. Ellis Parents Stegmayer PA-C 03/09/2018 11:01 AM

## 2018-03-09 NOTE — Plan of Care (Signed)
  Problem: Education: Goal: Knowledge of General Education information will improve Description Including pain rating scale, medication(s)/side effects and non-pharmacologic comfort measures Outcome: Progressing   Problem: Health Behavior/Discharge Planning: Goal: Ability to manage health-related needs will improve Outcome: Progressing   Problem: Clinical Measurements: Goal: Ability to maintain clinical measurements within normal limits will improve Outcome: Progressing Goal: Will remain free from infection Outcome: Progressing Goal: Diagnostic test results will improve Outcome: Progressing Goal: Respiratory complications will improve Outcome: Progressing Goal: Cardiovascular complication will be avoided Outcome: Progressing   Problem: Activity: Goal: Risk for activity intolerance will decrease Outcome: Progressing   Problem: Nutrition: Goal: Adequate nutrition will be maintained Outcome: Progressing   Problem: Coping: Goal: Level of anxiety will decrease Outcome: Progressing   Problem: Elimination: Goal: Will not experience complications related to urinary retention Outcome: Progressing   Problem: Pain Managment: Goal: General experience of comfort will improve Outcome: Progressing   Problem: Safety: Goal: Ability to remain free from injury will improve Outcome: Progressing   Problem: Skin Integrity: Goal: Risk for impaired skin integrity will decrease Outcome: Progressing   Problem: Education: Goal: Required Educational Video(s) Outcome: Progressing   Problem: Clinical Measurements: Goal: Postoperative complications will be avoided or minimized Outcome: Progressing   Problem: Skin Integrity: Goal: Demonstration of wound healing without infection will improve Outcome: Progressing

## 2018-03-09 NOTE — Progress Notes (Signed)
Pharmacy Antibiotic Note  Victor Wise is a 67 y.o. male admitted on 03/01/2018 with wound infection.  Pharmacy has been consulted for Vancomycin dosing.  Plan: 10/23 VT 17 mcg/mL 10/24 VT 20 mcg/mL.  Pt currently ordered vancomycin 1250 mg IV q8h.   Vancomycin level increased from 17 to 20 mcg/ml in one day on same dose and pt has had dosing delays and a missed dose in between. Pt also received contrast with his vascular procedure yesterday.  Pt likely at risk for accumulating if continued on current dose. Will change dose to vancomycin 1500 mg IV q12h and check vancomycin level prior to 5th dose.   Height: 5\' 8"  (172.7 cm) Weight: 293 lb 14 oz (133.3 kg) IBW/kg (Calculated) : 68.4  Temp (24hrs), Avg:98.3 F (36.8 C), Min:97.7 F (36.5 C), Max:99.4 F (37.4 C)  Recent Labs  Lab 03/03/18 0726 03/06/18 0411 03/07/18 0419 03/08/18 0434 03/08/18 0738 03/09/18 0441  WBC 6.4  --   --  5.1  --  5.2  CREATININE 1.05 0.86 0.88 0.86  --  0.77  VANCOTROUGH 17  --  17  --  20  --     Estimated Creatinine Clearance: 121.3 mL/min (by C-G formula based on SCr of 0.77 mg/dL).    Allergies  Allergen Reactions  . Amoxicillin Other (See Comments)    Has never taken amoxicillin -ENT told him not to take (? Had skin test) Has patient had a PCN reaction causing immediate rash, facial/tongue/throat swelling, SOB or lightheadedness with hypotension: No Has patient had a PCN reaction causing severe rash involving mucus membranes or skin necrosis: No Has patient had a PCN reaction that required hospitalization No Has patient had a PCN reaction occurring within the last 10 years: No If above answers are "NO", then may proceed w/ Cephalo  . Other Anaphylaxis, Itching and Other (See Comments)    Pt states that he is allergic to Endopa.  Reaction:  Anaphylaxis  Pt states that he is allergic to Metabisulfites Reaction:  Itching   . Penicillins Other (See Comments)    Arm turned "blue" at injection  site (no treatment needed) Has patient had a PCN reaction causing immediate rash, facial/tongue/throat swelling, SOB or lightheadedness with hypotension: No Has patient had a PCN reaction causing severe rash involving mucus membranes or skin necrosis: No Has patient had a PCN reaction that required hospitalization No Has patient had a PCN reaction occurring within the last 10 years: No If all of the above answers are "NO", then may proceed with Cephalosporin use.  [name] Hydralazine Other (See Comments)    Reaction:  Cramping of extremities  . Crestor [Rosuvastatin Calcium] Other (See Comments)    Reaction:  Pt is unable to move arms/legs   . Metoprolol Nausea And Vomiting  . Red Dye Itching  . Statins Other (See Comments)    Reaction:  Pt is unable to move arms/legs  . Sulfa Antibiotics Itching    Other reaction(s): Unknown  . Yellow Dyes (Non-Tartrazine) Itching  . Aspirin Itching and Other (See Comments)    Pt states that he is able to use in lower doses.    . Tape Rash    Please use "paper" tape only. Please use "paper" tape only.     Rayna Sexton, PharmD, BCPS Clinical Pharmacist 03/09/2018 10:22 AM

## 2018-03-09 NOTE — Progress Notes (Addendum)
1 Day Post-Op   Subjective/Chief Complaint: Patient seen.  Not in very good mood today.   Objective: Vital signs in last 24 hours: Temp:  [97.7 F (36.5 C)-99.4 F (37.4 C)] 98.3 F (36.8 C) (10/25 0828) Pulse Rate:  [58-62] 60 (10/25 0828) Resp:  [12-20] 20 (10/25 0828) BP: (119-139)/(59-72) 132/64 (10/25 0828) SpO2:  [93 %-99 %] 96 % (10/25 0828) Last BM Date: 03/04/18  Intake/Output from previous day: 10/24 0701 - 10/25 0700 In: 1962.6 [I.V.:697.1; IV Piggyback:1265.5] Out: 448 [Urine:800; Blood:75] Intake/Output this shift: Total I/O In: 240 [P.O.:240] Out: -   The bandage on the left foot is dry and intact.  No evidence of strikethrough.  Lab Results:  Recent Labs    03/08/18 0434 03/09/18 0441  WBC 5.1 5.2  HGB 9.4* 8.7*  HCT 30.1* 28.4*  PLT 277 269   BMET Recent Labs    03/08/18 0434 03/09/18 0441  NA 141 143  K 3.9 3.6  CL 106 108  CO2 30 27  GLUCOSE 189* 155*  BUN 17 17  CREATININE 0.86 0.77  CALCIUM 8.5* 8.0*   PT/INR No results for input(s): LABPROT, INR in the last 72 hours. ABG No results for input(s): PHART, HCO3 in the last 72 hours.  Invalid input(s): PCO2, PO2  Studies/Results: No results found.  Anti-infectives: Anti-infectives (From admission, onward)   Start     Dose/Rate Route Frequency Ordered Stop   03/09/18 2100  vancomycin (VANCOCIN) 1,500 mg in sodium chloride 0.9 % 500 mL IVPB     1,500 mg 250 mL/hr over 120 Minutes Intravenous Every 12 hours 03/09/18 1026     03/08/18 1822  vancomycin (VANCOCIN) powder  Status:  Discontinued       As needed 03/08/18 1822 03/08/18 1851   03/07/18 2200  cefTRIAXone (ROCEPHIN) 2 g in sodium chloride 0.9 % 100 mL IVPB     2 g 200 mL/hr over 30 Minutes Intravenous Every 24 hours 03/07/18 1645     03/07/18 2200  metroNIDAZOLE (FLAGYL) IVPB 500 mg     500 mg 100 mL/hr over 60 Minutes Intravenous Every 8 hours 03/07/18 1645     03/05/18 1316  vancomycin (VANCOCIN) 1,500 mg in sodium  chloride 0.9 % 500 mL IVPB  Status:  Discontinued     1,500 mg 250 mL/hr over 120 Minutes Intravenous 120 min pre-op 03/05/18 1316 03/05/18 1648   03/02/18 0000  vancomycin (VANCOCIN) 1,250 mg in sodium chloride 0.9 % 250 mL IVPB  Status:  Discontinued     1,250 mg 166.7 mL/hr over 90 Minutes Intravenous Every 8 hours 03/01/18 2224 03/09/18 1026   03/01/18 2245  meropenem (MERREM) 1 g in sodium chloride 0.9 % 100 mL IVPB  Status:  Discontinued     1 g 200 mL/hr over 30 Minutes Intravenous Every 8 hours 03/01/18 2241 03/07/18 1645   03/01/18 1700  vancomycin (VANCOCIN) IVPB 1000 mg/200 mL premix     1,000 mg 200 mL/hr over 60 Minutes Intravenous  Once 03/01/18 1659 03/01/18 1956      Assessment/Plan: s/p Procedure(s): IRRIGATION AND DEBRIDEMENT FOOT AND BONE (Left) Assessment: Stable status post debridement left foot.   Plan: Dressing left intact.  Plan for dressing change tomorrow for evaluation.  Patient may bear weight on his left foot wearing the OrthoWedge surgical shoe with pressure on the heel only for transfer.  Hopefully should be stable for discharge tomorrow as long as medically stable.  LOS: 8 days    Lyda Kalata  Cleda Mccreedy 03/09/2018

## 2018-03-10 DIAGNOSIS — F341 Dysthymic disorder: Secondary | ICD-10-CM

## 2018-03-10 LAB — BASIC METABOLIC PANEL
ANION GAP: 6 (ref 5–15)
BUN: 16 mg/dL (ref 8–23)
CO2: 29 mmol/L (ref 22–32)
Calcium: 8.2 mg/dL — ABNORMAL LOW (ref 8.9–10.3)
Chloride: 107 mmol/L (ref 98–111)
Creatinine, Ser: 0.87 mg/dL (ref 0.61–1.24)
Glucose, Bld: 203 mg/dL — ABNORMAL HIGH (ref 70–99)
Potassium: 3.3 mmol/L — ABNORMAL LOW (ref 3.5–5.1)
SODIUM: 142 mmol/L (ref 135–145)

## 2018-03-10 LAB — CBC
HCT: 28.7 % — ABNORMAL LOW (ref 39.0–52.0)
Hemoglobin: 8.9 g/dL — ABNORMAL LOW (ref 13.0–17.0)
MCH: 24.9 pg — ABNORMAL LOW (ref 26.0–34.0)
MCHC: 31 g/dL (ref 30.0–36.0)
MCV: 80.4 fL (ref 80.0–100.0)
PLATELETS: 261 10*3/uL (ref 150–400)
RBC: 3.57 MIL/uL — ABNORMAL LOW (ref 4.22–5.81)
RDW: 16 % — AB (ref 11.5–15.5)
WBC: 5.2 10*3/uL (ref 4.0–10.5)
nRBC: 0 % (ref 0.0–0.2)

## 2018-03-10 LAB — GLUCOSE, CAPILLARY
GLUCOSE-CAPILLARY: 167 mg/dL — AB (ref 70–99)
GLUCOSE-CAPILLARY: 171 mg/dL — AB (ref 70–99)
Glucose-Capillary: 219 mg/dL — ABNORMAL HIGH (ref 70–99)

## 2018-03-10 MED ORDER — CEFDINIR 300 MG PO CAPS
300.0000 mg | ORAL_CAPSULE | Freq: Two times a day (BID) | ORAL | Status: DC
  Administered 2018-03-10 – 2018-03-13 (×7): 300 mg via ORAL
  Filled 2018-03-10 (×8): qty 1

## 2018-03-10 MED ORDER — POTASSIUM CHLORIDE CRYS ER 20 MEQ PO TBCR
40.0000 meq | EXTENDED_RELEASE_TABLET | Freq: Two times a day (BID) | ORAL | Status: AC
  Administered 2018-03-10: 40 meq via ORAL
  Filled 2018-03-10: qty 2

## 2018-03-10 MED ORDER — LABETALOL HCL 5 MG/ML IV SOLN
20.0000 mg | Freq: Once | INTRAVENOUS | Status: AC
  Administered 2018-03-10: 20 mg via INTRAVENOUS
  Filled 2018-03-10: qty 4

## 2018-03-10 MED ORDER — METRONIDAZOLE 500 MG PO TABS
500.0000 mg | ORAL_TABLET | Freq: Three times a day (TID) | ORAL | Status: DC
  Administered 2018-03-10 – 2018-03-13 (×10): 500 mg via ORAL
  Filled 2018-03-10 (×10): qty 1

## 2018-03-10 NOTE — Progress Notes (Signed)
PT Cancellation Note  Patient Details Name: Victor Wise MRN: [id number] DOB: [birth year]   Cancelled Treatment:    Reason Eval/Treat Not Completed: Patient declined, no reason specified.  Pt currently eating breakfast.  Will return later as time allows.   Roxanne Gates, PT, DPT 03/10/2018, 9:10 AM

## 2018-03-10 NOTE — Progress Notes (Signed)
[facility] NAME: Victor Wise    MR#:  [id number]  DATE OF BIRTH:  [birth year]  SUBJECTIVE:  CHIEF COMPLAINT:   Chief Complaint  Patient presents with  . Wound Infection  . Foot Pain  No complaints, subspecialty input appreciated REVIEW OF SYSTEMS:  Review of Systems  Constitutional: Negative for chills and fever.  HENT: Negative for ear pain, hearing loss and tinnitus.   Eyes: Negative for blurred vision and double vision.  Respiratory: Negative for cough, shortness of breath and wheezing.   Cardiovascular: Positive for leg swelling. Negative for chest pain and palpitations.  Gastrointestinal: Negative for abdominal pain, constipation, diarrhea, nausea and vomiting.  Genitourinary: Negative for dysuria.  Musculoskeletal: Negative for myalgias.  Skin: Negative for rash.  Neurological: Positive for weakness. Negative for dizziness, focal weakness, seizures and headaches.  Psychiatric/Behavioral: Negative for depression.    DRUG ALLERGIES:   Allergies  Allergen Reactions  . Amoxicillin Other (See Comments)    Has never taken amoxicillin -ENT told him not to take (? Had skin test) Has patient had a PCN reaction causing immediate rash, facial/tongue/throat swelling, SOB or lightheadedness with hypotension: No Has patient had a PCN reaction causing severe rash involving mucus membranes or skin necrosis: No Has patient had a PCN reaction that required hospitalization No Has patient had a PCN reaction occurring within the last 10 years: No If above answers are "NO", then may proceed w/ Cephalo  . Other Anaphylaxis, Itching and Other (See Comments)    Pt states that he is allergic to Endopa.  Reaction:  Anaphylaxis  Pt states that he is allergic to Metabisulfites Reaction:  Itching   . Penicillins Other (See Comments)    Arm turned "blue" at injection site (no treatment needed) Has patient had a PCN reaction causing immediate  rash, facial/tongue/throat swelling, SOB or lightheadedness with hypotension: No Has patient had a PCN reaction causing severe rash involving mucus membranes or skin necrosis: No Has patient had a PCN reaction that required hospitalization No Has patient had a PCN reaction occurring within the last 10 years: No If all of the above answers are "NO", then may proceed with Cephalosporin use.  [name] Hydralazine Other (See Comments)    Reaction:  Cramping of extremities  . Crestor [Rosuvastatin Calcium] Other (See Comments)    Reaction:  Pt is unable to move arms/legs   . Metoprolol Nausea And Vomiting  . Red Dye Itching  . Statins Other (See Comments)    Reaction:  Pt is unable to move arms/legs  . Sulfa Antibiotics Itching    Other reaction(s): Unknown  . Yellow Dyes (Non-Tartrazine) Itching  . Aspirin Itching and Other (See Comments)    Pt states that he is able to use in lower doses.    . Tape Rash    Please use "paper" tape only. Please use "paper" tape only.    VITALS:  Blood pressure (!) 182/79, pulse 62, temperature 98.6 F (37 C), temperature source Oral, resp. rate (!) 24, height 5\' 8"  (1.727 m), weight 133.3 kg, SpO2 100 %.  PHYSICAL EXAMINATION:  Physical Exam  GENERAL:  67 y.o.-year-old obese patient lying in the bed with no acute distress.  EYES: Pupils equal, round, reactive to light and accommodation. No scleral icterus. Extraocular muscles intact.  HEENT: Head atraumatic, normocephalic. Oropharynx and nasopharynx clear.  NECK:  Supple, no jugular venous distention. No thyroid enlargement, no tenderness.  LUNGS: Normal breath sounds bilaterally,  no wheezing, rales,rhonchi or crepitation. No use of accessory muscles of respiration.  Decreased bibasilar breath sounds CARDIOVASCULAR: S1, S2 normal. No murmurs, rubs, or gallops.  ABDOMEN: Soft, nontender, nondistended. Bowel sounds present. No organomegaly or mass.  EXTREMITIES: No pedal edema, cyanosis, or clubbing.  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   - Left foot dressing in place. NEUROLOGIC: Cranial nerves II through XII are intact. Muscle strength 5/5 in all extremities. Sensation intact. Gait not checked.  PSYCHIATRIC: The patient is alert and oriented x 3.  SKIN: No obvious rash, lesion, or ulcer.    LABORATORY PANEL:   CBC Recent Labs  Lab 03/10/18 0429  WBC 5.2  HGB 8.9*  HCT 28.7*  PLT 261   ------------------------------------------------------------------------------------------------------------------  Chemistries  Recent Labs  Lab 03/08/18 0434  03/10/18 0429  NA 141   < > 142  K 3.9   < > 3.3*  CL 106   < > 107  CO2 30   < > 29  GLUCOSE 189*   < > 203*  BUN 17   < > 16  CREATININE 0.86   < > 0.87  CALCIUM 8.5*   < > 8.2*  MG 2.0  --   --    < > = values in this interval not displayed.   ------------------------------------------------------------------------------------------------------------------  Cardiac Enzymes No results for input(s): TROPONINI in the last 168 hours. ------------------------------------------------------------------------------------------------------------------  RADIOLOGY:  No results found.  EKG:   Orders placed or performed during the hospital encounter of 10/26/15  . EKG 12-Lead  . EKG 12-Lead  . EKG 12-Lead in am (before 8am)  . EKG 12-Lead in am (before 8am)  . EKG    ASSESSMENT AND PLAN:   67 year old male with past medical history significant for diabetes, hypertension, chronic low back pain and CAD resents to hospital secondary to left foot diabetic ulcer  1.  Left foot cellulitis and diabetic foot ulcer S/p left foot debridement by Dr. Darcus Pester on March 05, 2018  Podiatry, vascular, ID input appreciated Continue Omnicef/Flagyl per ID recommendations, physical therapy to evaluate/treat, possible home with home health PT versus inpatient rehab S/p angiogram on 03/05/2018 showing significant  occlusions- repeat angiogram on 03/08/2018 with the successful revascularization by Dr. Lucky Cowboy        Percutaneous transluminal angioplasty of left posterior tibial artery with 3 mm diameter by 30 cm length angioplasty balloon and of the tibioperoneal trunk with 4 mm diameter by 10 cm length Lutonix drug-coated angioplasty balloon   Percutaneous transluminal angioplasty of the left popliteal artery and the entire SFA with 2 inflations with a 5 mm diameter by 22 cm length Lutonix drug-coated angioplasty balloon and a proximal inflation with a 6 mm diameter by 22 cm length tonics drug-coated angioplasty balloon           Placement of 2 Viabahn stents in the left SFA and popliteal artery with both being 6 mm diameter by 25 cm length stentswith pedal approach, if that is not successful, patient will need bypass surgery.  2.  Diabetes mellitus-sliding scale insulin HA1c of 8 Controlled on current regiment Metformin held while in house  3.  Chronic back pain Stable on Lyrica, Cymbalta, oxycodone, and OxyContin  4.  Hypertension Stable on benazepril, and hydrochlorothiazide.  5.  Acute on chronic depression  Psychiatry consulted  6.  Constipation Resolved on current bowel regiment  Disposition to skilled nursing facility/inpatient rehab versus home with home health PT-physical therapy reconsulted  All  the records are reviewed and case discussed with Care Management/Social Workerr. Management plans discussed with the patient, family and they are in agreement.  CODE STATUS: Full code  TOTAL TIME TAKING CARE OF THIS PATIENT: 36 minutes.   POSSIBLE D/C IN 2  DAYS, DEPENDING ON CLINICAL CONDITION.   Avel Peace Sircharles Holzheimer M.D on 03/10/2018 at 1:01 PM  Between 7am to 6pm - Pager - [phone]   After 6pm go to [email or web address] - password [email or web address] [facility]  Office  [phone]  CC: Primary care physician; Lavera Guise, MD

## 2018-03-10 NOTE — Progress Notes (Signed)
Pt's BP continued to be 180/60s even after PRN labetalol, Dr. Brett Albino paged and received order for one time dose 20 mg labetalol.   Pt also reported pain in left calf and has increased swelling from this morning. Dr. Brett Albino notified, and placed order for ultrasound. Pt updated.   [place], [name]

## 2018-03-10 NOTE — Progress Notes (Signed)
2 Days Post-Op   Subjective/Chief Complaint: Patient seen.  Still some pain in the foot but overall doing okay   Objective: Vital signs in last 24 hours: Temp:  [98.6 F (37 C)-99.2 F (37.3 C)] 98.6 F (37 C) (10/26 0826) Pulse Rate:  [59-62] 62 (10/26 0826) Resp:  [18-24] 24 (10/25 2321) BP: (166-182)/(75-79) 182/79 (10/26 0826) SpO2:  [97 %-100 %] 100 % (10/26 0826) Last BM Date: 03/04/18  Intake/Output from previous day: 10/25 0701 - 10/26 0700 In: 2296.8 [P.O.:720; I.V.:1184; IV Piggyback:392.7] Out: 852 [Urine:851; Stool:1] Intake/Output this shift: Total I/O In: 2944.7 [I.V.:372.5; IV Piggyback:2572.2] Out: 100 [Urine:100]  Bandage on the left foot is dry and intact.  Upon removal there is moderate bleeding from the surgical site.  No signs of purulence.  The incision is fairly well coapted.  Lab Results:  Recent Labs    03/09/18 0441 03/10/18 0429  WBC 5.2 5.2  HGB 8.7* 8.9*  HCT 28.4* 28.7*  PLT 269 261   BMET Recent Labs    03/09/18 0441 03/10/18 0429  NA 143 142  K 3.6 3.3*  CL 108 107  CO2 27 29  GLUCOSE 155* 203*  BUN 17 16  CREATININE 0.77 0.87  CALCIUM 8.0* 8.2*   PT/INR No results for input(s): LABPROT, INR in the last 72 hours. ABG No results for input(s): PHART, HCO3 in the last 72 hours.  Invalid input(s): PCO2, PO2  Studies/Results: No results found.  Anti-infectives: Anti-infectives (From admission, onward)   Start     Dose/Rate Route Frequency Ordered Stop   03/09/18 2100  vancomycin (VANCOCIN) 1,500 mg in sodium chloride 0.9 % 500 mL IVPB     1,500 mg 250 mL/hr over 120 Minutes Intravenous Every 12 hours 03/09/18 1026     03/08/18 1822  vancomycin (VANCOCIN) powder  Status:  Discontinued       As needed 03/08/18 1822 03/08/18 1851   03/07/18 2200  cefTRIAXone (ROCEPHIN) 2 g in sodium chloride 0.9 % 100 mL IVPB     2 g 200 mL/hr over 30 Minutes Intravenous Every 24 hours 03/07/18 1645     03/07/18 2200  metroNIDAZOLE  (FLAGYL) IVPB 500 mg     500 mg 100 mL/hr over 60 Minutes Intravenous Every 8 hours 03/07/18 1645     03/05/18 1316  vancomycin (VANCOCIN) 1,500 mg in sodium chloride 0.9 % 500 mL IVPB  Status:  Discontinued     1,500 mg 250 mL/hr over 120 Minutes Intravenous 120 min pre-op 03/05/18 1316 03/05/18 1648   03/02/18 0000  vancomycin (VANCOCIN) 1,250 mg in sodium chloride 0.9 % 250 mL IVPB  Status:  Discontinued     1,250 mg 166.7 mL/hr over 90 Minutes Intravenous Every 8 hours 03/01/18 2224 03/09/18 1026   03/01/18 2245  meropenem (MERREM) 1 g in sodium chloride 0.9 % 100 mL IVPB  Status:  Discontinued     1 g 200 mL/hr over 30 Minutes Intravenous Every 8 hours 03/01/18 2241 03/07/18 1645   03/01/18 1700  vancomycin (VANCOCIN) IVPB 1000 mg/200 mL premix     1,000 mg 200 mL/hr over 60 Minutes Intravenous  Once 03/01/18 1659 03/01/18 1956      Assessment/Plan: s/p Procedure(s): IRRIGATION AND DEBRIDEMENT FOOT AND BONE (Left) Assessment: Stable status post debridement left foot.   Plan: Betadine and a sterile dressing reapplied to the left foot.  Instructed the patient he may be weightbearing on the left heel and his wedge surgical shoe.  We will give  him bathroom privileges with assistance.  At this point he is stable for discharge as far as his foot is concerned and will follow-up later this week outpatient.  Stable for discharge as soon as cleared by medicine.  Will need oral antibiotics and I think he should be okay on Augmentin but may want to clear this with infectious disease.  I will follow-up with the patient this Wednesday at [facility]  LOS: 9 days    Durward Fortes 03/10/2018

## 2018-03-10 NOTE — Consult Note (Signed)
[facility] Face-to-Face Psychiatry Consult   Reason for Consult: Consult for this 67 year old man in the hospital for further treatment of his ulcer on his left foot.  Concern about depression Referring Physician: Salary Patient Identification: Victor Wise MRN:  [id number] Principal Diagnosis: Dysthymia Diagnosis:   Patient Active Problem List   Diagnosis Date Noted  . Diabetic foot infection ([place]) [K44.010, L08.9] 03/01/2018  . Hypertension [I10] 09/05/2017  . OSA on CPAP [G47.33, Z99.89] 09/05/2017  . Uncontrolled type 2 diabetes mellitus with hyperglycemia ([place]) [E11.65] 08/02/2017  . Peripheral vascular disease ([place]) [I73.9] 08/02/2017  . Mixed hyperlipidemia [E78.2] 08/02/2017  . Dysuria [R30.0] 08/02/2017  . Acute renal failure (ARF) ([place]) [N17.9] 02/18/2016  . Hypotension [I95.9] 02/18/2016  . Sick sinus syndrome ([place]) [I49.5] 10/28/2015  . Syncopal episodes [R55] 10/26/2015  . Syncope [R55] 10/26/2015  . Adjustment disorder with mixed anxiety and depressed mood [F43.23] 10/20/2015  . Dysthymia [F34.1] 10/20/2015  . CVA (cerebral infarction) [I63.9] 10/19/2015  . Urinary retention [R33.9] 03/25/2015  . 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 Acute MI, anterolateral wall, subsequent episode of care ([place]) [I21.09] 12/19/2014  . SIRS (systemic inflammatory response syndrome) ([facility]) [R65.10] 12/18/2014    Total Time spent with patient: 1 hour  Subjective:   Victor Wise is a 67 y.o. male patient admitted with "I am fine".  HPI: Patient seen chart reviewed.  Patient familiar to me from consults from a few years ago.  67 year old man in the hospital for debridement of a diabetic foot ulcer and complications related to it.  Patient tells me that his mood feels exactly the same as usual.  He acknowledges that he has chronic depression and says he has been dealing with that with it for years and never expects it to change.  He says that he sleeps fine and eats fine.  Denies any suicidal  thoughts saying that he would never kill himself because then he feels like "they win" by which she seems to mean that he would have displayed unacceptable weakness.  Patient denies any psychotic symptoms.  He is on several psychiatric medicines including at least 2 antidepressant medicines.  Denies homicidal ideation.  Patient has no requests from me or interest in any change of treatment.  Medical history: Diabetes with complications particularly now with a ulcer in his left foot.  Social history: Currently has been living independently which for him is an improvement over where he was a few years ago.  Feels like his living situation is pretty adequate.  Substance abuse history: No history of current or past alcohol or drug abuse  Past Psychiatric History: Patient has been seen several times previously because of concerns about his dysphoria and depression.  He has been on antidepressant medicine and feels like they may have been of at least a little bit of help.  Denies any past suicide attempts  Risk to Self:   Risk to Others:   Prior Inpatient Therapy:   Prior Outpatient Therapy:    Past Medical History:  Past Medical History:  Diagnosis Date  . Allergy   . Basal cell carcinoma    forehead  . BPH (benign prostatic hyperplasia)   . Chronic back pain   . Depression   . Diabetes ([place])   . GERD (gastroesophageal reflux disease)   . Heart attack ([place])   . Hypertension   . Morbid obesity ([place])   . Obstructive sleep apnea   . 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 Status post  insertion of spinal cord stimulator   . Stroke ([place])   . UTI (lower urinary tract infection)     Past Surgical History:  Procedure Laterality Date  . CARDIAC CATHETERIZATION N/A 12/19/2014   Procedure: Coronary Stent Intervention;  Surgeon: Charolette Forward, MD;  Location: [place] [facility];  Service: Cardiovascular;  Laterality: N/A;  . CARDIAC CATHETERIZATION Left 12/19/2014   Procedure: Left Heart Cath and Coronary  Angiography;  Surgeon: Dionisio Tyrice, MD;  Location: [place] [facility];  Service: Cardiovascular;  Laterality: Left;  . IRRIGATION AND DEBRIDEMENT FOOT Left 03/02/2018   Procedure: IRRIGATION AND DEBRIDEMENT FOOT;  Surgeon: Samara Deist, DPM;  Location: [facility];  Service: Podiatry;  Laterality: Left;  . IRRIGATION AND DEBRIDEMENT FOOT Left 03/08/2018   Procedure: IRRIGATION AND DEBRIDEMENT FOOT AND BONE;  Surgeon: Sharlotte Alamo, DPM;  Location: [facility];  Service: Podiatry;  Laterality: Left;  . LOWER EXTREMITY ANGIOGRAPHY Left 03/05/2018   Procedure: Lower Extremity Angiography;  Surgeon: Algernon Huxley, MD;  Location: [place] [facility];  Service: Cardiovascular;  Laterality: Left;  . LOWER EXTREMITY ANGIOGRAPHY Left 03/08/2018   Procedure: LOWER EXTREMITY ANGIOGRAPHY;  Surgeon: Algernon Huxley, MD;  Location: [place] [facility];  Service: Cardiovascular;  Laterality: Left;  . PACEMAKER INSERTION Left 11/02/2015   Procedure: INSERTION PACEMAKER;  Surgeon: Isaias Cowman, MD;  Location: [facility];  Service: Cardiovascular;  Laterality: Left;  . Pain Stimulator    . Right toe amputation     Family History:  Family History  Problem Relation Age of Onset  . Breast cancer Mother   . Cancer Mother   . Hypertension Mother   . Lung cancer Father   . Hypertension Father   . Heart disease Father   . Cancer Father   . Kidney disease Sister   . Prostate cancer Neg Hx    Family Psychiatric  History: None known Social History:  Social History   Substance and Sexual Activity  Alcohol Use Yes  . Alcohol/week: 0.0 standard drinks   Comment: occasionally     Social History   Substance and Sexual Activity  Drug Use No    Social History   Socioeconomic History  . Marital status: Divorced    Spouse name: Not on file  . Number of children: Not on file  . Years of education: Not on file  . Highest education level: Not on file  Occupational History  . Not on file  Social Needs  .  Financial resource strain: Not on file  . Food insecurity:    Worry: Not on file    Inability: Not on file  . Transportation needs:    Medical: Not on file    Non-medical: Not on file  Tobacco Use  . Smoking status: Former Smoker    Types: Cigarettes  . Smokeless tobacco: Never Used  . Tobacco comment: quit 45 years  Substance and Sexual Activity  . Alcohol use: Yes    Alcohol/week: 0.0 standard drinks    Comment: occasionally  . Drug use: No  . Sexual activity: Not on file  Lifestyle  . Physical activity:    Days per week: Not on file    Minutes per session: Not on file  . Stress: Not on file  Relationships  . Social connections:    Talks on phone: Not on file    Gets together: Not on file    Attends religious service: Not on file    Active member of club  or organization: Not on file    Attends meetings of clubs or organizations: Not on file    Relationship status: Not on file  Other Topics Concern  . Not on file  Social History Narrative  . Not on file   Additional Social History:    Allergies:   Allergies  Allergen Reactions  . Amoxicillin Other (See Comments)    Has never taken amoxicillin -ENT told him not to take (? Had skin test) Has patient had a PCN reaction causing immediate rash, facial/tongue/throat swelling, SOB or lightheadedness with hypotension: No Has patient had a PCN reaction causing severe rash involving mucus membranes or skin necrosis: No Has patient had a PCN reaction that required hospitalization No Has patient had a PCN reaction occurring within the last 10 years: No If above answers are "NO", then may proceed w/ Cephalo  . Other Anaphylaxis, Itching and Other (See Comments)    Pt states that he is allergic to Endopa.  Reaction:  Anaphylaxis  Pt states that he is allergic to Metabisulfites Reaction:  Itching   . Penicillins Other (See Comments)    Arm turned "blue" at injection site (no treatment needed) Has patient had a PCN reaction  causing immediate rash, facial/tongue/throat swelling, SOB or lightheadedness with hypotension: No Has patient had a PCN reaction causing severe rash involving mucus membranes or skin necrosis: No Has patient had a PCN reaction that required hospitalization No Has patient had a PCN reaction occurring within the last 10 years: No If all of the above answers are "NO", then may proceed with Cephalosporin use.  [name] Hydralazine Other (See Comments)    Reaction:  Cramping of extremities  . Crestor [Rosuvastatin Calcium] Other (See Comments)    Reaction:  Pt is unable to move arms/legs   . Metoprolol Nausea And Vomiting  . Red Dye Itching  . Statins Other (See Comments)    Reaction:  Pt is unable to move arms/legs  . Sulfa Antibiotics Itching    Other reaction(s): Unknown  . Yellow Dyes (Non-Tartrazine) Itching  . Aspirin Itching and Other (See Comments)    Pt states that he is able to use in lower doses.    . Tape Rash    Please use "paper" tape only. Please use "paper" tape only.    Labs:  Results for orders placed or performed during the hospital encounter of 03/01/18 (from the past 48 hour(s))  Glucose, capillary     Status: Abnormal   Collection Time: 03/08/18  4:55 PM  Result Value Ref Range   Glucose-Capillary 157 (H) 70 - 99 mg/dL  Glucose, capillary     Status: Abnormal   Collection Time: 03/08/18  6:58 PM  Result Value Ref Range   Glucose-Capillary 151 (H) 70 - 99 mg/dL  Glucose, capillary     Status: Abnormal   Collection Time: 03/08/18  9:20 PM  Result Value Ref Range   Glucose-Capillary 145 (H) 70 - 99 mg/dL   Comment 1 Notify RN   CBC     Status: Abnormal   Collection Time: 03/09/18  4:41 AM  Result Value Ref Range   WBC 5.2 4.0 - 10.5 K/uL   RBC 3.49 (L) 4.22 - 5.81 MIL/uL   Hemoglobin 8.7 (L) 13.0 - 17.0 g/dL   HCT 28.4 (L) 39.0 - 52.0 %   MCV 81.4 80.0 - 100.0 fL   MCH 24.9 (L) 26.0 - 34.0 pg   MCHC 30.6 30.0 - 36.0 g/dL   RDW 16.2 (  H) 11.5 - 15.5 %    Platelets 269 150 - 400 K/uL   nRBC 0.0 0.0 - 0.2 %    Comment: Performed at [facility], [address] [place], [place] [address]  Basic metabolic panel     Status: Abnormal   Collection Time: 03/09/18  4:41 AM  Result Value Ref Range   Sodium 143 135 - 145 mmol/L   Potassium 3.6 3.5 - 5.1 mmol/L   Chloride 108 98 - 111 mmol/L   CO2 27 22 - 32 mmol/L   Glucose, Bld 155 (H) 70 - 99 mg/dL   BUN 17 8 - 23 mg/dL   Creatinine, Ser 0.77 0.61 - 1.24 mg/dL   Calcium 8.0 (L) 8.9 - 10.3 mg/dL   GFR calc non Af Amer >60 >60 mL/min   GFR calc Af Amer >60 >60 mL/min    Comment: (NOTE) The eGFR has been calculated using the CKD EPI equation. This calculation has not been validated in all clinical situations. eGFR's persistently <60 mL/min signify possible Chronic Kidney Disease.    Anion gap 8 5 - 15    Comment: Performed at [facility], [address] [place], [place] [address]  Glucose, capillary     Status: Abnormal   Collection Time: 03/09/18  8:25 AM  Result Value Ref Range   Glucose-Capillary 139 (H) 70 - 99 mg/dL  Glucose, capillary     Status: Abnormal   Collection Time: 03/09/18 12:02 PM  Result Value Ref Range   Glucose-Capillary 197 (H) 70 - 99 mg/dL  Glucose, capillary     Status: Abnormal   Collection Time: 03/09/18  4:50 PM  Result Value Ref Range   Glucose-Capillary 194 (H) 70 - 99 mg/dL  Glucose, capillary     Status: Abnormal   Collection Time: 03/09/18  9:03 PM  Result Value Ref Range   Glucose-Capillary 171 (H) 70 - 99 mg/dL  CBC     Status: Abnormal   Collection Time: 03/10/18  4:29 AM  Result Value Ref Range   WBC 5.2 4.0 - 10.5 K/uL   RBC 3.57 (L) 4.22 - 5.81 MIL/uL   Hemoglobin 8.9 (L) 13.0 - 17.0 g/dL   HCT 28.7 (L) 39.0 - 52.0 %   MCV 80.4 80.0 - 100.0 fL   MCH 24.9 (L) 26.0 - 34.0 pg   MCHC 31.0 30.0 - 36.0 g/dL   RDW 16.0 (H) 11.5 - 15.5 %   Platelets 261 150 - 400 K/uL   nRBC 0.0 0.0 - 0.2 %    Comment: Performed at [facility], [address] [place], [place] [address]  Basic metabolic panel     Status: Abnormal   Collection Time: 03/10/18  4:29 AM  Result Value Ref Range   Sodium 142 135 - 145 mmol/L   Potassium 3.3 (L) 3.5 - 5.1 mmol/L   Chloride 107 98 - 111 mmol/L   CO2 29 22 - 32 mmol/L   Glucose, Bld 203 (H) 70 - 99 mg/dL   BUN 16 8 - 23 mg/dL   Creatinine, Ser 0.87 0.61 - 1.24 mg/dL   Calcium 8.2 (L) 8.9 - 10.3 mg/dL   GFR calc non Af Amer >60 >60 mL/min   GFR calc Af Amer >60 >60 mL/min    Comment: (NOTE) The eGFR has been calculated using the CKD EPI equation. This calculation has not been validated in all clinical situations. eGFR's persistently <60 mL/min signify possible Chronic Kidney Disease.  Anion gap 6 5 - 15    Comment: Performed at [facility], [address] [place], [place] [address]  Glucose, capillary     Status: Abnormal   Collection Time: 03/10/18  8:24 AM  Result Value Ref Range   Glucose-Capillary 167 (H) 70 - 99 mg/dL   Comment 1 Notify RN   Glucose, capillary     Status: Abnormal   Collection Time: 03/10/18 11:52 AM  Result Value Ref Range   Glucose-Capillary 171 (H) 70 - 99 mg/dL   Comment 1 Notify RN     Current Facility-Administered Medications  Medication Dose Route Frequency Provider Last Rate Last Dose  . 0.9 %  sodium chloride infusion   Intravenous PRN Sharlotte Alamo, DPM 0 mL/hr at 03/02/18 1029    . acetaminophen (TYLENOL) tablet 650 mg  650 mg Oral Q6H PRN Sharlotte Alamo, DPM   650 mg at 03/09/18 0128   Or  . acetaminophen (TYLENOL) suppository 650 mg  650 mg Rectal Q6H PRN Sharlotte Alamo, DPM      . aspirin EC tablet 81 mg  81 mg Oral Daily Sharlotte Alamo, DPM   81 mg at 03/10/18 0859  . benazepril (LOTENSIN) tablet 20 mg  20 mg Oral BID Sharlotte Alamo, DPM   20 mg at 03/10/18 0904  . buPROPion (WELLBUTRIN XL) 24 hr tablet 300 mg  300 mg Oral Daily Sharlotte Alamo, DPM   300 mg at 03/10/18 0900  . busPIRone (BUSPAR) tablet 10 mg  10 mg Oral QHS  Sharlotte Alamo, DPM   10 mg at 03/09/18 2117  . cefdinir (OMNICEF) capsule 300 mg  300 mg Oral Q12H Salary, Montell D, MD   300 mg at 03/10/18 1400  . clopidogrel (PLAVIX) tablet 75 mg  75 mg Oral Daily Sharlotte Alamo, DPM   75 mg at 03/10/18 0900  . DULoxetine (CYMBALTA) DR capsule 30 mg  30 mg Oral QHS Sharlotte Alamo, DPM   30 mg at 03/09/18 2117  . DULoxetine (CYMBALTA) DR capsule 60 mg  60 mg Oral Daily Sharlotte Alamo, DPM   60 mg at 03/10/18 0903  . enoxaparin (LOVENOX) injection 40 mg  40 mg Subcutaneous BID Sharlotte Alamo, DPM   40 mg at 03/10/18 0858  . fenofibrate tablet 54 mg  54 mg Oral Daily Sharlotte Alamo, DPM   54 mg at 03/09/18 1741  . finasteride (PROSCAR) tablet 5 mg  5 mg Oral Daily Sharlotte Alamo, DPM   5 mg at 03/10/18 0902  . hydrochlorothiazide (HYDRODIURIL) tablet 12.5 mg  12.5 mg Oral Daily Sharlotte Alamo, DPM   12.5 mg at 03/10/18 0900  . Influenza vac split quadrivalent PF (FLUZONE HIGH-DOSE) injection 0.5 mL  0.5 mL Intramuscular Tomorrow-1000 Sharlotte Alamo, DPM      . insulin aspart (novoLOG) injection 0-5 Units  0-5 Units Subcutaneous QHS Sharlotte Alamo, DPM   2 Units at 03/03/18 2141  . insulin aspart (novoLOG) injection 0-9 Units  0-9 Units Subcutaneous TID WC Sharlotte Alamo, DPM   2 Units at 03/10/18 1219  . insulin glargine (LANTUS) injection 36 Units  36 Units Subcutaneous QHS Sharlotte Alamo, DPM   36 Units at 03/09/18 2118  . labetalol (NORMODYNE,TRANDATE) injection 10 mg  10 mg Intravenous Q4H PRN Sharlotte Alamo, DPM      . lactulose (CHRONULAC) 10 GM/15ML solution 20 g  20 g Oral Daily PRN Sharlotte Alamo, DPM      . linagliptin (TRADJENTA) tablet 5 mg  5 mg Oral Daily Cleda Mccreedy,  Todd, DPM   5 mg at 03/10/18 0903  . methocarbamol (ROBAXIN) tablet 500 mg  500 mg Oral Q8H PRN Sharlotte Alamo, DPM   500 mg at 03/10/18 1400  . metroNIDAZOLE (FLAGYL) tablet 500 mg  500 mg Oral Q8H Salary, Montell D, MD   500 mg at 03/10/18 1400  . ondansetron (ZOFRAN) tablet 4 mg  4 mg Oral Q6H PRN Sharlotte Alamo, DPM       Or  .  ondansetron [facility]) injection 4 mg  4 mg Intravenous Q6H PRN Sharlotte Alamo, DPM      . Oxcarbazepine (TRILEPTAL) tablet 300 mg  300 mg Oral Daily Sharlotte Alamo, DPM   300 mg at 03/10/18 0903  . Oxcarbazepine (TRILEPTAL) tablet 600 mg  600 mg Oral QHS Sharlotte Alamo, DPM   600 mg at 03/09/18 2116  . oxyCODONE (Oxy IR/ROXICODONE) immediate release tablet 5 mg  5 mg Oral Q4H PRN Sharlotte Alamo, DPM   5 mg at 03/09/18 1513  . oxyCODONE (OXYCONTIN) 12 hr tablet 20 mg  20 mg Oral Q12H Sharlotte Alamo, DPM   20 mg at 03/10/18 0900  . pantoprazole (PROTONIX) EC tablet 40 mg  40 mg Oral Daily Sharlotte Alamo, DPM   40 mg at 03/10/18 0900  . polyethylene glycol (MIRALAX / GLYCOLAX) packet 17 g  17 g Oral Daily Sharlotte Alamo, DPM   17 g at 03/10/18 0904  . pregabalin (LYRICA) capsule 200 mg  200 mg Oral TID Sharlotte Alamo, DPM   200 mg at 03/10/18 0900  . senna-docusate (Senokot-S) tablet 1 tablet  1 tablet Oral BID Sharlotte Alamo, DPM   1 tablet at 03/10/18 0859  . tamsulosin (FLOMAX) capsule 0.4 mg  0.4 mg Oral QPC supper Sharlotte Alamo, DPM   0.4 mg at 03/09/18 1741    Musculoskeletal: Strength & Muscle Tone: within normal limits Gait & Station: unsteady Patient leans: N/A  Psychiatric Specialty Exam: Physical Exam  Nursing note and vitals reviewed. Constitutional: He appears well-developed and well-nourished.  HENT:  Head: Normocephalic and atraumatic.  Eyes: Pupils are equal, round, and reactive to light. Conjunctivae are normal.  Neck: Normal range of motion.  Cardiovascular: Regular rhythm and normal heart sounds.  Respiratory: Effort normal. No respiratory distress.  GI: Soft.  Musculoskeletal: Normal range of motion.  Neurological: He is alert.  Skin: Skin is warm and dry.  Psychiatric: Judgment normal. His affect is blunt. His speech is delayed. He is slowed. Thought content is not paranoid. Cognition and memory are normal. He expresses no homicidal and no suicidal ideation.    Review of Systems   Constitutional: Negative.   HENT: Negative.   Eyes: Negative.   Respiratory: Negative.   Cardiovascular: Negative.   Gastrointestinal: Negative.   Musculoskeletal: Positive for joint pain.  Skin: Negative.   Neurological: Negative.   Psychiatric/Behavioral: Positive for depression. Negative for hallucinations, memory loss, substance abuse and suicidal ideas. The patient is not nervous/anxious and does not have insomnia.     Blood pressure (!) 182/79, pulse 62, temperature 98.6 F (37 C), temperature source Oral, resp. rate (!) 24, height _0  (1.727 m), weight 133.3 kg, SpO2 100 %.Body mass index is 44.68 kg/m.  General Appearance: Casual  Eye Contact:  Minimal  Speech:  Clear and Coherent  Volume:  Decreased  Mood:  Dysphoric  Affect:  Congruent  Thought Process:  Goal Directed  Orientation:  Full (Time, Place, and Person)  Thought Content:  Logical  Suicidal Thoughts:  No  Homicidal  Thoughts:  No  Memory:  Immediate;   Fair Recent;   Fair Remote;   Fair  Judgement:  Fair  Insight:  Fair  Psychomotor Activity:  Decreased  Concentration:  Concentration: Fair  Recall:  [employer] of Knowledge:  Fair  Language:  Fair  Akathisia:  No  Handed:  Right  AIMS (if indicated):     Assets:  Desire for Improvement Housing Resilience Social Support  ADL's:  Impaired  Cognition:  WNL  Sleep:        Treatment Plan Summary: Plan Patient with chronic depression who appears to be currently at his baseline from what he says and when I compare it to previous presentations.  He is lucid without psychotic symptoms.  Denies any suicidal thoughts.  Does not have any interest in any change to his medication.  I tried to offer some supportive counseling and encouraged him to please ask for help if there was anything we could do.  Patient acknowledges this but did not wish any change to treatment.  There is no sign of any acute dangerousness.  If he is still here tomorrow I will follow-up  again but I am not making any changes to his orders.  Disposition: No evidence of imminent risk to self or others at present.   Patient does not meet criteria for psychiatric inpatient admission. Supportive therapy provided about ongoing stressors.  Alethia Berthold, MD 03/10/2018 2:15 PM

## 2018-03-10 NOTE — Progress Notes (Signed)
Received a page from RN that patient is having significant posterior left lower extremity pain that started this evening.  He has also had new swelling.  Patient had an I&D of the left lower extremity about 2 days ago.  He has been receiving Lovenox for DVT prophylaxis, however given his new symptoms, will obtain duplex ultrasound of the left lower extremity.  Hyman Bible, MD

## 2018-03-10 NOTE — Progress Notes (Signed)
FBS is 226

## 2018-03-10 NOTE — Progress Notes (Signed)
PT Cancellation Note  Patient Details Name: Victor Wise MRN: [id number] DOB: 01/26/1951   Cancelled Treatment:    Reason Eval/Treat Not Completed: Patient at procedure or test/unavailable.  Pt meeting with physician and then NA in to give pt bed bath afterward.  Will re-attempt at a later time when pt is available.   Roxanne Gates, PT, DPT 03/10/2018, 10:28 AM

## 2018-03-10 NOTE — Progress Notes (Signed)
PT Cancellation Note  Patient Details Name: Victor Wise MRN: [id number] DOB: August 06, 1950   Cancelled Treatment:    Reason Eval/Treat Not Completed: Patient at procedure or test/unavailable.  Patient is currently with nursing but stated that he would be interested in getting out of his bed afterwards.  Will return when time allows.   Roxanne Gates, PT, DPT 03/10/2018, 8:49 AM

## 2018-03-10 NOTE — Plan of Care (Signed)
  Problem: Education: Goal: Knowledge of General Education information will improve Description Including pain rating scale, medication(s)/side effects and non-pharmacologic comfort measures Outcome: Progressing   Problem: Health Behavior/Discharge Planning: Goal: Ability to manage health-related needs will improve Outcome: Progressing   Problem: Clinical Measurements: Goal: Ability to maintain clinical measurements within normal limits will improve Outcome: Progressing Goal: Will remain free from infection Outcome: Progressing Goal: Diagnostic test results will improve Outcome: Progressing Goal: Respiratory complications will improve Outcome: Progressing Goal: Cardiovascular complication will be avoided Outcome: Progressing   Problem: Activity: Goal: Risk for activity intolerance will decrease Outcome: Progressing   Problem: Nutrition: Goal: Adequate nutrition will be maintained Outcome: Progressing   Problem: Coping: Goal: Level of anxiety will decrease Outcome: Progressing   Problem: Elimination: Goal: Will not experience complications related to urinary retention Outcome: Progressing   Problem: Pain Managment: Goal: General experience of comfort will improve Outcome: Progressing   Problem: Safety: Goal: Ability to remain free from injury will improve Outcome: Progressing   Problem: Skin Integrity: Goal: Risk for impaired skin integrity will decrease Outcome: Progressing   Problem: Education: Goal: Required Educational Video(s) Outcome: Progressing   Problem: Clinical Measurements: Goal: Postoperative complications will be avoided or minimized Outcome: Progressing   Problem: Skin Integrity: Goal: Demonstration of wound healing without infection will improve Outcome: Progressing

## 2018-03-11 ENCOUNTER — Inpatient Hospital Stay: Payer: Medicare HMO

## 2018-03-11 LAB — GLUCOSE, CAPILLARY
Glucose-Capillary: 169 mg/dL — ABNORMAL HIGH (ref 70–99)
Glucose-Capillary: 195 mg/dL — ABNORMAL HIGH (ref 70–99)
Glucose-Capillary: 167 mg/dL — ABNORMAL HIGH (ref 70–99)
Glucose-Capillary: 183 mg/dL — ABNORMAL HIGH (ref 70–99)

## 2018-03-11 NOTE — Progress Notes (Signed)
Victor Wise at [place] NAME: Victor Wise    MR#:  [id number]  DATE OF BIRTH:  [birth year]  SUBJECTIVE:  CHIEF COMPLAINT:   Chief Complaint  Patient presents with  . Wound Infection  . Foot Pain  Complains of foot pain only, lower extremity Doppler negative for DVT  REVIEW OF SYSTEMS:  Review of Systems  Constitutional: Negative for chills and fever.  HENT: Negative for ear pain, hearing loss and tinnitus.   Eyes: Negative for blurred vision and double vision.  Respiratory: Negative for cough, shortness of breath and wheezing.   Cardiovascular: Positive for leg swelling. Negative for chest pain and palpitations.  Gastrointestinal: Negative for abdominal pain, constipation, diarrhea, nausea and vomiting.  Genitourinary: Negative for dysuria.  Musculoskeletal: Negative for myalgias.  Skin: Negative for rash.  Neurological: Positive for weakness. Negative for dizziness, focal weakness, seizures and headaches.  Psychiatric/Behavioral: Negative for depression.    DRUG ALLERGIES:   Allergies  Allergen Reactions  . Amoxicillin Other (See Comments)    Has never taken amoxicillin -ENT told him not to take (? Had skin test) Has patient had a PCN reaction causing immediate rash, facial/tongue/throat swelling, SOB or lightheadedness with hypotension: No Has patient had a PCN reaction causing severe rash involving mucus membranes or skin necrosis: No Has patient had a PCN reaction that required hospitalization No Has patient had a PCN reaction occurring within the last 10 years: No If above answers are "NO", then may proceed w/ Cephalo  . Other Anaphylaxis, Itching and Other (See Comments)    Pt states that he is allergic to Endopa.  Reaction:  Anaphylaxis  Pt states that he is allergic to Metabisulfites Reaction:  Itching   . Penicillins Other (See Comments)    Arm turned "blue" at injection site (no treatment needed) Has patient had a PCN  reaction causing immediate rash, facial/tongue/throat swelling, SOB or lightheadedness with hypotension: No Has patient had a PCN reaction causing severe rash involving mucus membranes or skin necrosis: No Has patient had a PCN reaction that required hospitalization No Has patient had a PCN reaction occurring within the last 10 years: No If all of the above answers are "NO", then may proceed with Cephalosporin use.  [name] Hydralazine Other (See Comments)    Reaction:  Cramping of extremities  . Crestor [Rosuvastatin Calcium] Other (See Comments)    Reaction:  Pt is unable to move arms/legs   . Metoprolol Nausea And Vomiting  . Red Dye Itching  . Statins Other (See Comments)    Reaction:  Pt is unable to move arms/legs  . Sulfa Antibiotics Itching    Other reaction(s): Unknown  . Yellow Dyes (Non-Tartrazine) Itching  . Aspirin Itching and Other (See Comments)    Pt states that he is able to use in lower doses.    . Tape Rash    Please use "paper" tape only. Please use "paper" tape only.    VITALS:  Blood pressure (!) 150/74, pulse 63, temperature 98 F (36.7 C), temperature source Oral, resp. rate 20, height 5\' 8"  (1.727 m), weight 133.3 kg, SpO2 95 %.  PHYSICAL EXAMINATION:  Physical Exam  GENERAL:  67 y.o.-year-old obese patient lying in the bed with no acute distress.  EYES: Pupils equal, round, reactive to light and accommodation. No scleral icterus. Extraocular muscles intact.  HEENT: Head atraumatic, normocephalic. Oropharynx and nasopharynx clear.  NECK:  Supple, no jugular venous distention. No thyroid enlargement, no tenderness.  LUNGS: Normal breath sounds bilaterally, no wheezing, rales,rhonchi or crepitation. No use of accessory muscles of respiration.  Decreased bibasilar breath sounds CARDIOVASCULAR: S1, S2 normal. No murmurs, rubs, or gallops.  ABDOMEN: Soft, nontender, nondistended. Bowel sounds present. No organomegaly or mass.  EXTREMITIES: No pedal edema, cyanosis,  or clubbing.  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   - Left foot dressing in place. NEUROLOGIC: Cranial nerves II through XII are intact. Muscle strength 5/5 in all extremities. Sensation intact. Gait not checked.  PSYCHIATRIC: The patient is alert and oriented x 3.  SKIN: No obvious rash, lesion, or ulcer.    LABORATORY PANEL:   CBC Recent Labs  Lab 03/10/18 0429  WBC 5.2  HGB 8.9*  HCT 28.7*  PLT 261   ------------------------------------------------------------------------------------------------------------------  Chemistries  Recent Labs  Lab 03/08/18 0434  03/10/18 0429  NA 141   < > 142  K 3.9   < > 3.3*  CL 106   < > 107  CO2 30   < > 29  GLUCOSE 189*   < > 203*  BUN 17   < > 16  CREATININE 0.86   < > 0.87  CALCIUM 8.5*   < > 8.2*  MG 2.0  --   --    < > = values in this interval not displayed.   ------------------------------------------------------------------------------------------------------------------  Cardiac Enzymes No results for input(s): TROPONINI in the last 168 hours. ------------------------------------------------------------------------------------------------------------------  RADIOLOGY:  US Venous Img Lower Unilateral Left  Result Date: 03/11/2018 CLINICAL DATA:  Left lower extremity pain and edema for the past day. Evaluate for DVT. EXAM: LEFT LOWER EXTREMITY VENOUS DOPPLER ULTRASOUND TECHNIQUE: Gray-scale sonography with graded compression, as well as color Doppler and duplex ultrasound were performed to evaluate the lower extremity deep venous systems from the level of the common femoral vein and including the common femoral, femoral, profunda femoral, popliteal and calf veins including the posterior tibial, peroneal and gastrocnemius veins when visible. The superficial great saphenous vein was also interrogated. Spectral Doppler was utilized to evaluate flow at rest and with distal augmentation maneuvers in the common  femoral, femoral and popliteal veins. COMPARISON:  None. FINDINGS: Contralateral Common Femoral Vein: Respiratory phasicity is normal and symmetric with the symptomatic side. No evidence of thrombus. Normal compressibility. Common Femoral Vein: No evidence of thrombus. Normal compressibility, respiratory phasicity and response to augmentation. Saphenofemoral Junction: No evidence of thrombus. Normal compressibility and flow on color Doppler imaging. Profunda Femoral Vein: No evidence of thrombus. Normal compressibility and flow on color Doppler imaging. Femoral Vein: No evidence of thrombus. Normal compressibility, respiratory phasicity and response to augmentation. Popliteal Vein: Patient experienced pain with attempted compression of the left popliteal vein however the vessel appears widely patent on color Doppler imaging. No evidence of thrombus. Normal respiratory phasicity and response to augmentation. Calf Veins: No evidence of thrombus. Normal compressibility and flow on color Doppler imaging. Superficial Great Saphenous Vein: No evidence of thrombus. Normal compressibility. Venous Reflux:  None. Other Findings: There is an approximately 3.1 x 1.4 x 0.5 cm fluid collection with the left popliteal fossa compatible with a Baker's cyst. IMPRESSION: 1. No definite evidence of DVT within left lower extremity. 2. Incidentally noted approximately 3.1 cm left-sided Baker's cyst. Electronically Signed   By: Sandi Mariscal M.D.   On: 03/11/2018 09:13    EKG:   Orders placed or performed during the hospital encounter of 10/26/15  . EKG 12-Lead  . EKG 12-Lead  . EKG  12-Lead in am (before 8am)  . EKG 12-Lead in am (before 8am)  . EKG    ASSESSMENT AND PLAN:   67 year old male with past medical history significant for diabetes, hypertension, chronic low back pain and CAD resents to hospital secondary to left foot diabetic ulcer  1.  Left foot cellulitis and diabetic foot ulcer S/p left foot debridement by  Dr. Darcus Pester on March 05, 2018  Podiatry, vascular, ID input appreciated Continue Omnicef/Flagyl per ID recommendations, physical therapy to evaluate/treat, possible home with home health PT versus inpatient rehab S/p angiogram on 03/05/2018 showing significant occlusions- repeat angiogram on 03/08/2018 with the successful revascularization by Dr. Lucky Cowboy        Percutaneous transluminal angioplasty of left posterior tibial artery with 3 mm diameter by 30 cm length angioplasty balloon and of the tibioperoneal trunk with 4 mm diameter by 10 cm length Lutonix drug-coated angioplasty balloon   Percutaneous transluminal angioplasty of the left popliteal artery and the entire SFA with 2 inflations with a 5 mm diameter by 22 cm length Lutonix drug-coated angioplasty balloon and a proximal inflation with a 6 mm diameter by 22 cm length tonics drug-coated angioplasty balloon           Placement of 2 Viabahn stents in the left SFA and popliteal artery with both being 6 mm diameter by 25 cm length stentswith pedal approach, if that is not successful, patient will need bypass surgery. Physical therapy did see patient while in house-for skilled nursing home placement when bed is available  2.  Diabetes mellitus-sliding scale insulin HA1c of 8 Controlled on current regiment Metformin held while in house  3.  Chronic back pain Stable on Lyrica, Cymbalta, oxycodone, and OxyContin  4.  Hypertension Stable on benazepril, and hydrochlorothiazide.  5.  Acute on chronic depression  Psychiatry consulted  6.  Constipation Resolved on current bowel regiment  Disposition to skilled nursing facility when bed is available  All the records are reviewed and case discussed with Care Management/Social Workerr. Management plans discussed with the patient, family and they are in agreement.  CODE STATUS: Full code  TOTAL TIME TAKING CARE OF THIS PATIENT: 36 minutes.   POSSIBLE D/C IN 2  DAYS, DEPENDING  ON CLINICAL CONDITION.   Avel Peace Dilan Novosad M.D on 03/11/2018 at 12:18 PM  Between 7am to 6pm - Pager - [phone]   After 6pm go to [email or web address] - password [email or web address] [facility]  Office  [phone]  CC: Primary care physician; Lavera Guise, MD

## 2018-03-11 NOTE — Progress Notes (Signed)
Physical Therapy Treatment Patient Details Name: Victor Wise MRN: [id number] DOB: 08/19/1950 Today's Date: 03/11/2018    History of Present Illness Patient admitted s/p I&D of medial L foot.  PMH includes stroke, spinal cord stimulator, MI, diabetes, depression and Htn.    PT Comments    Pt was seen for updating of progress but did discuss with him the limitations of his functional level.  He is declining to get OOB to chair, and notably was struggling with his pain and difficulty figuring out his best strategy to get to chair. Pt was reluctant to let PT direct him, ultimately returning to bed with limited accomplishment of scooting distance on side of bed.  Will recommend SNF with pt requiring more medication and teaching about the goals of therapy to increase his progress.   Follow Up Recommendations  SNF     Equipment Recommendations  None recommended by PT    Recommendations for Other Services       Precautions / Restrictions Precautions Precautions: Fall Restrictions Weight Bearing Restrictions: Yes LLE Weight Bearing: (NWB during gait and PWB with transfers) LLE Partial Weight Bearing Percentage or Pounds: 50%    Mobility  Bed Mobility Overal bed mobility: Needs Assistance Bed Mobility: Supine to Sit;Sit to Supine     Supine to sit: Mod assist Sit to supine: Mod assist   General bed mobility comments: assisted with trunk to sit up and could not let PT assist LLE  Transfers Overall transfer level: Needs assistance Equipment used: Rolling walker (2 wheeled);1 person hand held assist(sat side of bed and had ortho shoes but could not assist wel) Transfers: Sit to/from Stand;Lateral/Scoot Transfers Sit to Stand: Total assist Stand pivot transfers: Total assist      Lateral/Scoot Transfers: Mod assist;From elevated surface(very short transition) General transfer comment: pt ultimately would not attempt to scoot or stand without delay  Ambulation/Gait                  Stairs             Wheelchair Mobility    Modified Rankin (Stroke Patients Only)       Balance Overall balance assessment: Needs assistance Sitting-balance support: Feet supported;Bilateral upper extremity supported Sitting balance-Leahy Scale: Good                                      Cognition Arousal/Alertness: Awake/alert Behavior During Therapy: Agitated Overall Cognitive Status: Difficult to assess                                        Exercises      General Comments        Pertinent Vitals/Pain Pain Assessment: Faces Faces Pain Scale: Hurts whole lot Pain Location:  and R hip Pain Descriptors / Indicators: Aching;Grimacing Pain Intervention(s): Limited activity within patient's tolerance;Monitored during session;Repositioned;Premedicated before session;RN gave pain meds during session    Home Living                      Prior Function            PT Goals (current goals can now be found in the care plan section) Acute Rehab PT Goals Patient Stated Goal: get better and get home PT Goal Formulation: With patient Time For Goal  Achievement: 03/18/18 Potential to Achieve Goals: Fair Progress towards PT goals: Not progressing toward goals - comment    Frequency    Min 2X/week      PT Plan Current plan remains appropriate    Co-evaluation              AM-PAC PT "6 Clicks" Daily Activity  Outcome Measure  Difficulty turning over in bed (including adjusting bedclothes, sheets and blankets)?: Unable Difficulty moving from lying on back to sitting on the side of the bed? : Unable Difficulty sitting down on and standing up from a chair with arms (e.g., wheelchair, bedside commode, etc,.)?: Unable Help needed moving to and from a bed to chair (including a wheelchair)?: A Lot Help needed walking in hospital room?: Total Help needed climbing 3-5 steps with a railing? : Total 6 Click  Score: 7    End of Session Equipment Utilized During Treatment: Gait belt Activity Tolerance: Patient limited by fatigue;Patient limited by pain Patient left: in bed;with call bell/phone within reach;with bed alarm set Nurse Communication: Mobility status PT Visit Diagnosis: Unsteadiness on feet (R26.81);Muscle weakness (generalized) (M62.81);Pain Pain - Right/Left: Left Pain - part of body: Ankle and joints of foot     Time: 1001-1029 PT Time Calculation (min) (ACUTE ONLY): 28 min  Charges:  $Therapeutic Activity: 23-37 mins                     Ramond Dial 03/11/2018, 8:47 PM   Mee Hives, PT MS Acute Rehab Dept. Number: [place] and [place]

## 2018-03-12 LAB — SURGICAL PATHOLOGY

## 2018-03-12 LAB — GLUCOSE, CAPILLARY
GLUCOSE-CAPILLARY: 182 mg/dL — AB (ref 70–99)
GLUCOSE-CAPILLARY: 201 mg/dL — AB (ref 70–99)
GLUCOSE-CAPILLARY: 215 mg/dL — AB (ref 70–99)
GLUCOSE-CAPILLARY: 220 mg/dL — AB (ref 70–99)
Glucose-Capillary: 226 mg/dL — ABNORMAL HIGH (ref 70–99)

## 2018-03-12 MED ORDER — METRONIDAZOLE 500 MG PO TABS: 500.0000 mg | ORAL_TABLET | Freq: Three times a day (TID) | ORAL | 0 refills | Status: DC

## 2018-03-12 MED ORDER — DULOXETINE HCL 60 MG PO CPEP: 60.0000 mg | ORAL_CAPSULE | Freq: Every day | ORAL | 0 refills | Status: DC

## 2018-03-12 MED ORDER — METHOCARBAMOL 500 MG PO TABS: 500.0000 mg | ORAL_TABLET | Freq: Three times a day (TID) | ORAL | 0 refills | Status: DC | PRN

## 2018-03-12 MED ORDER — OXYCODONE HCL 5 MG PO TABS: 5.0000 mg | ORAL_TABLET | ORAL | 0 refills | Status: DC | PRN

## 2018-03-12 MED ORDER — CEFDINIR 300 MG PO CAPS: 300.0000 mg | ORAL_CAPSULE | Freq: Two times a day (BID) | ORAL | 0 refills | Status: DC

## 2018-03-12 NOTE — Progress Notes (Signed)
Physical Therapy Treatment Patient Details Name: Victor Wise MRN: [id number] DOB: 01/01/1951 Today's Date: 03/12/2018    History of Present Illness Patient admitted s/p I&D of medial L foot.  PMH includes stroke, spinal cord stimulator, MI, diabetes, depression and Htn.    PT Comments    Pt in bed, requesting to go to the bathroom.  Stated he has walked into bathroom x 2 with staff of Friday and Saturday.  Reviewed weight bearing limitations for PWB L heel for transfers only.  Pt has been refusing therapy sessions and was unable to stand at edge of bed with multiple attempts so I am unsure if it is accurate that he has actually walked to/from bathroom.  Wedge was donned to L Le and post op boot on right.  Mod a x 2 to edge of bed, attempted to stand x 3 with max a x 2 but unable to fully stand despite assist. "I can't do it."  He was able to take 3 very small lateral scoots towards head of bed with mod a x 2 with assist on bed pad.  Pt was incontinent of urine upon arrival and new gown and pad were changed.  He was noted after bed mobility to have several small smears of BM on bedding but refused bedpan or linen change stating he was in too much pain.  Stated R knee arthritic changes limit WB on RLE and general pain in LLE also limiting.  LLE is noted to be swollen but he stated that is his baseline and it actually improved.  Pt requests to rest and will call nursing staff when he is ready for bedpan.   Follow Up Recommendations  SNF     Equipment Recommendations       Recommendations for Other Services       Precautions / Restrictions Precautions Precautions: Fall Restrictions Weight Bearing Restrictions: Yes LLE Weight Bearing: Partial weight bearing LLE Partial Weight Bearing Percentage or Pounds: 50% - for transfers only, otherwise NWB    Mobility  Bed Mobility Overal bed mobility: Needs Assistance Bed Mobility: Supine to Sit;Sit to Supine     Supine to sit: Mod assist;+2  for physical assistance Sit to supine: Mod assist;+2 for physical assistance      Transfers Overall transfer level: Needs assistance Equipment used: Rolling walker (2 wheeled) Transfers: Sit to/from Stand;Lateral/Scoot Transfers Sit to Stand: From elevated surface;+2 physical assistance;Total assist        Lateral/Scoot Transfers: Mod assist;From elevated surface;+2 physical assistance General transfer comment: poor ablility to lateral scoot in sitting with only 3 small movements towards head of bed.  Unable to fully stand despite max a x 2.  Ambulation/Gait             General Gait Details: limited to transfers only   Stairs             Wheelchair Mobility    Modified Rankin (Stroke Patients Only)       Balance Overall balance assessment: Needs assistance Sitting-balance support: Feet supported;Bilateral upper extremity supported Sitting balance-Leahy Scale: Good     Standing balance support: Bilateral upper extremity supported Standing balance-Leahy Scale: Zero                              Cognition Arousal/Alertness: Awake/alert Behavior During Therapy: WFL for tasks assessed/performed Overall Cognitive Status: Within Functional Limits for tasks assessed  Exercises      General Comments        Pertinent Vitals/Pain Pain Assessment: 0-10 Pain Score: 8  Pain Location: L low leg Pain Descriptors / Indicators: Aching;Grimacing Pain Intervention(s): Limited activity within patient's tolerance;Monitored during session    Home Living                      Prior Function            PT Goals (current goals can now be found in the care plan section) Progress towards PT goals: Not progressing toward goals - comment    Frequency    Min 2X/week      PT Plan Current plan remains appropriate    Co-evaluation              AM-PAC PT "6 Clicks" Daily Activity   Outcome Measure  Difficulty turning over in bed (including adjusting bedclothes, sheets and blankets)?: Unable Difficulty moving from lying on back to sitting on the side of the bed? : Unable Difficulty sitting down on and standing up from a chair with arms (e.g., wheelchair, bedside commode, etc,.)?: Unable Help needed moving to and from a bed to chair (including a wheelchair)?: Total Help needed walking in hospital room?: Total Help needed climbing 3-5 steps with a railing? : Total 6 Click Score: 6    End of Session Equipment Utilized During Treatment: Gait belt Activity Tolerance: Patient limited by fatigue;Patient limited by pain Patient left: in bed;with call bell/phone within reach;with bed alarm set   Pain - Right/Left: Left Pain - part of body: Ankle and joints of foot     Time: 6195-0932 PT Time Calculation (min) (ACUTE ONLY): 19 min  Charges:  $Therapeutic Activity: 8-22 mins                     Chesley Noon, PTA 03/12/18, 10:40 AM

## 2018-03-12 NOTE — Care Management Important Message (Signed)
Important Message  Patient Details  Name: Victor Wise MRN: [id number] Date of Birth: [birth year]   Medicare Important Message Given:  Yes    Juliann Pulse A Finnian Husted 03/12/2018, 11:27 AM

## 2018-03-12 NOTE — Progress Notes (Signed)
[facility] NAME: Victor Wise    MR#:  [id number]  DATE OF BIRTH:  1950/11/06  SUBJECTIVE:  CHIEF COMPLAINT:   Chief Complaint  Patient presents with  . Wound Infection  . Foot Pain  Complains without complaint  REVIEW OF SYSTEMS:  Review of Systems  Constitutional: Negative for chills and fever.  HENT: Negative for ear pain, hearing loss and tinnitus.   Eyes: Negative for blurred vision and double vision.  Respiratory: Negative for cough, shortness of breath and wheezing.   Cardiovascular: Positive for leg swelling. Negative for chest pain and palpitations.  Gastrointestinal: Negative for abdominal pain, constipation, diarrhea, nausea and vomiting.  Genitourinary: Negative for dysuria.  Musculoskeletal: Negative for myalgias.  Skin: Negative for rash.  Neurological: Positive for weakness. Negative for dizziness, focal weakness, seizures and headaches.  Psychiatric/Behavioral: Negative for depression.    DRUG ALLERGIES:   Allergies  Allergen Reactions  . Amoxicillin Other (See Comments)    Has never taken amoxicillin -ENT told him not to take (? Had skin test) Has patient had a PCN reaction causing immediate rash, facial/tongue/throat swelling, SOB or lightheadedness with hypotension: No Has patient had a PCN reaction causing severe rash involving mucus membranes or skin necrosis: No Has patient had a PCN reaction that required hospitalization No Has patient had a PCN reaction occurring within the last 10 years: No If above answers are "NO", then may proceed w/ Cephalo  . Other Anaphylaxis, Itching and Other (See Comments)    Pt states that he is allergic to Endopa.  Reaction:  Anaphylaxis  Pt states that he is allergic to Metabisulfites Reaction:  Itching   . Penicillins Other (See Comments)    Arm turned "blue" at injection site (no treatment needed) Has patient had a PCN reaction causing immediate rash,  facial/tongue/throat swelling, SOB or lightheadedness with hypotension: No Has patient had a PCN reaction causing severe rash involving mucus membranes or skin necrosis: No Has patient had a PCN reaction that required hospitalization No Has patient had a PCN reaction occurring within the last 10 years: No If all of the above answers are "NO", then may proceed with Cephalosporin use.  [name] Hydralazine Other (See Comments)    Reaction:  Cramping of extremities  . Crestor [Rosuvastatin Calcium] Other (See Comments)    Reaction:  Pt is unable to move arms/legs   . Metoprolol Nausea And Vomiting  . Red Dye Itching  . Statins Other (See Comments)    Reaction:  Pt is unable to move arms/legs  . Sulfa Antibiotics Itching    Other reaction(s): Unknown  . Yellow Dyes (Non-Tartrazine) Itching  . Aspirin Itching and Other (See Comments)    Pt states that he is able to use in lower doses.    . Tape Rash    Please use "paper" tape only. Please use "paper" tape only.    VITALS:  Blood pressure (!) 154/79, pulse 63, temperature 98.1 F (36.7 C), temperature source Oral, resp. rate 18, height 5\' 8"  (1.727 m), weight 133.3 kg, SpO2 97 %.  PHYSICAL EXAMINATION:  Physical Exam  GENERAL:  67 y.o.-year-old obese patient lying in the bed with no acute distress.  EYES: Pupils equal, round, reactive to light and accommodation. No scleral icterus. Extraocular muscles intact.  HEENT: Head atraumatic, normocephalic. Oropharynx and nasopharynx clear.  NECK:  Supple, no jugular venous distention. No thyroid enlargement, no tenderness.  LUNGS: Normal breath sounds bilaterally, no wheezing,  rales,rhonchi or crepitation. No use of accessory muscles of respiration.  Decreased bibasilar breath sounds CARDIOVASCULAR: S1, S2 normal. No murmurs, rubs, or gallops.  ABDOMEN: Soft, nontender, nondistended. Bowel sounds present. No organomegaly or mass.  EXTREMITIES: No pedal edema, cyanosis, or clubbing.  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   - Left foot dressing in place. NEUROLOGIC: Cranial nerves II through XII are intact. Muscle strength 5/5 in all extremities. Sensation intact. Gait not checked.  PSYCHIATRIC: The patient is alert and oriented x 3.  SKIN: No obvious rash, lesion, or ulcer.    LABORATORY PANEL:   CBC Recent Labs  Lab 03/10/18 0429  WBC 5.2  HGB 8.9*  HCT 28.7*  PLT 261   ------------------------------------------------------------------------------------------------------------------  Chemistries  Recent Labs  Lab 03/08/18 0434  03/10/18 0429  NA 141   < > 142  K 3.9   < > 3.3*  CL 106   < > 107  CO2 30   < > 29  GLUCOSE 189*   < > 203*  BUN 17   < > 16  CREATININE 0.86   < > 0.87  CALCIUM 8.5*   < > 8.2*  MG 2.0  --   --    < > = values in this interval not displayed.   ------------------------------------------------------------------------------------------------------------------  Cardiac Enzymes No results for input(s): TROPONINI in the last 168 hours. ------------------------------------------------------------------------------------------------------------------  RADIOLOGY:  US Venous Img Lower Unilateral Left  Result Date: 03/11/2018 CLINICAL DATA:  Left lower extremity pain and edema for the past day. Evaluate for DVT. EXAM: LEFT LOWER EXTREMITY VENOUS DOPPLER ULTRASOUND TECHNIQUE: Gray-scale sonography with graded compression, as well as color Doppler and duplex ultrasound were performed to evaluate the lower extremity deep venous systems from the level of the common femoral vein and including the common femoral, femoral, profunda femoral, popliteal and calf veins including the posterior tibial, peroneal and gastrocnemius veins when visible. The superficial great saphenous vein was also interrogated. Spectral Doppler was utilized to evaluate flow at rest and with distal augmentation maneuvers in the common femoral, femoral and  popliteal veins. COMPARISON:  None. FINDINGS: Contralateral Common Femoral Vein: Respiratory phasicity is normal and symmetric with the symptomatic side. No evidence of thrombus. Normal compressibility. Common Femoral Vein: No evidence of thrombus. Normal compressibility, respiratory phasicity and response to augmentation. Saphenofemoral Junction: No evidence of thrombus. Normal compressibility and flow on color Doppler imaging. Profunda Femoral Vein: No evidence of thrombus. Normal compressibility and flow on color Doppler imaging. Femoral Vein: No evidence of thrombus. Normal compressibility, respiratory phasicity and response to augmentation. Popliteal Vein: Patient experienced pain with attempted compression of the left popliteal vein however the vessel appears widely patent on color Doppler imaging. No evidence of thrombus. Normal respiratory phasicity and response to augmentation. Calf Veins: No evidence of thrombus. Normal compressibility and flow on color Doppler imaging. Superficial Great Saphenous Vein: No evidence of thrombus. Normal compressibility. Venous Reflux:  None. Other Findings: There is an approximately 3.1 x 1.4 x 0.5 cm fluid collection with the left popliteal fossa compatible with a Baker's cyst. IMPRESSION: 1. No definite evidence of DVT within left lower extremity. 2. Incidentally noted approximately 3.1 cm left-sided Baker's cyst. Electronically Signed   By: Sandi Mariscal M.D.   On: 03/11/2018 09:13    EKG:   Orders placed or performed during the hospital encounter of 10/26/15  . EKG 12-Lead  . EKG 12-Lead  . EKG 12-Lead in am (before 8am)  .  EKG 12-Lead in am (before 8am)  . EKG    ASSESSMENT AND PLAN:  67 year old male with past medical history significant for diabetes, hypertension, chronic low back pain and CAD resents to hospital secondary to left foot diabetic ulcer  1.  Left foot cellulitis and diabetic foot ulcer Resolving S/p left foot debridement by Dr.  Darcus Pester on March 05, 2018  Podiatry, vascular, ID input appreciated Continue Omnicef/Flagyl per ID recommendations, physical therapy-for skilled nursing facility placement once bed is available  S/p angiogram on 03/05/2018 showing significant occlusions- repeat angiogram on 03/08/2018 with the successful revascularization by Dr. Lucky Cowboy        Percutaneous transluminal angioplasty of left posterior tibial artery with 3 mm diameter by 30 cm length angioplasty balloon and of the tibioperoneal trunk with 4 mm diameter by 10 cm length Lutonix drug-coated angioplasty balloon   Percutaneous transluminal angioplasty of the left popliteal artery and the entire SFA with 2 inflations with a 5 mm diameter by 22 cm length Lutonix drug-coated angioplasty balloon and a proximal inflation with a 6 mm diameter by 22 cm length tonics drug-coated angioplasty balloon           Placement of 2 Viabahn stents in the left SFA and popliteal artery with both being 6 mm diameter by 25 cm length stentswith pedal approach, if that is not successful, patient will need bypass surgery.  2.  Diabetes mellitus-sliding scale insulin HA1c of 8 Controlled on current regiment Metformin held while in house  3.  Chronic back pain Stable on current regiment   4.  Hypertension Stable on benazepril, and hydrochlorothiazide.  5.  Acute on chronic depression  Psychiatry consulted-no intervention recommended  6.  Constipation Resolved on current bowel regiment  Disposition to skilled nursing facility when bed is available  All the records are reviewed and case discussed with Care Management/Social Workerr. Management plans discussed with the patient, family and they are in agreement.  CODE STATUS: Full code  TOTAL TIME TAKING CARE OF THIS PATIENT: 36 minutes.   POSSIBLE D/C IN 1-2  DAYS, DEPENDING ON CLINICAL CONDITION.   Avel Peace Jerome Viglione M.D on 03/12/2018 at 11:20 AM  Between 7am to 6pm - Pager -  [phone]   After 6pm go to [email or web address] - password [email or web address]  Office  [phone]  CC: Primary care physician; Lavera Guise, MD

## 2018-03-12 NOTE — Progress Notes (Signed)
Inpatient Diabetes Program Recommendations  AACE/ADA: New Consensus Statement on Inpatient Glycemic Control (2015)  Target Ranges:  Prepandial:   less than 140 mg/dL      Peak postprandial:   less than 180 mg/dL (1-2 hours)      Critically ill patients:  140 - 180 mg/dL   Results for CRIXUS, MCAULAY (MRN [id number]) as of 03/12/2018 10:14  Ref. Range 03/11/2018 07:57 03/11/2018 11:56 03/11/2018 17:14 03/11/2018 21:05  Glucose-Capillary Latest Ref Range: 70 - 99 mg/dL 169 (H)  2 units NOVOLOG 195 (H)  2 units NOVOLOG 167 (H)  2 units NOVOLOG  183 (H)    36 units LANTUS   Results for CAPRI, RABEN (MRN [id number]) as of 03/12/2018 10:14  Ref. Range 03/12/2018 07:56  Glucose-Capillary Latest Ref Range: 70 - 99 mg/dL 182 (H)  2 units NOVOLOG     Home DM Meds: Lantus 60 units QHS       Regular (Novolin R) TID per SSI       Bydureon 2 mg QWednesday         Current Orders: Lantus 36 units QHS       Novolog Sensitive Correction Scale/ SSI (0-9 units) TID AC + HS      Tradjenta 5 mg Daily     MD- Please consider increasing Lantus slightly to 40 units QHS     --Will follow patient during hospitalization--  Wyn Quaker RN, MSN, CDE Diabetes Coordinator Inpatient Glycemic Control Team Team Pager: [phone] (8a-5p)

## 2018-03-12 NOTE — Progress Notes (Signed)
Patient ID: Victor Wise, male   DOB: [birth year], 67 y.o.   MRN: [id number] Subjective: Patient seen.  Still some pain in the calf.  Swelling is improved.  Objective: Still some significant edema in the left lower extremity.  Moderate drainage is noted on the bandaging.  Incision well coapted with a small central open area with some exposed beads.  No significant cellulitis.  Assessment: Good progress status post debridement left foot.  Plan: Betadine and sterile bandage reapplied to the left foot.  Plan is for transfer to skilled nursing.  He will need dressing changes 3 times a week.  Follow-up in 2 weeks outpatient for suture removal.

## 2018-03-12 NOTE — Discharge Summary (Addendum)
[facility] NAME: Victor Wise    MR#:  [id number]  DATE OF BIRTH:  [birth year]  DATE OF ADMISSION:  03/01/2018 ADMITTING PHYSICIAN: Lance Coon, MD  DATE OF DISCHARGE: No discharge date for patient encounter.  PRIMARY CARE PHYSICIAN: Lavera Guise, MD    ADMISSION DIAGNOSIS:  Cellulitis of left lower extremity [L03.116]  DISCHARGE DIAGNOSIS:  Principal Problem:   Dysthymia Active Problems:   Uncontrolled type 2 diabetes mellitus with hyperglycemia (HCC)   Mixed hyperlipidemia   Hypertension   OSA on CPAP   Diabetic foot infection ([place])   SECONDARY DIAGNOSIS:   Past Medical History:  Diagnosis Date  . Allergy   . Basal cell carcinoma    forehead  . BPH (benign prostatic hyperplasia)   . Chronic back pain   . Depression   . Diabetes ([place])   . GERD (gastroesophageal reflux disease)   . Heart attack ([place])   . Hypertension   . Morbid obesity ([place])   . Obstructive sleep apnea   . 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 Status post insertion of spinal cord stimulator   . Stroke ([place])   . UTI (lower urinary tract infection)     HOSPITAL COURSE:   67 year old male with past medical history significant for diabetes, hypertension, chronic low back pain and CAD resents to hospital secondary to left foot diabetic ulcer  1.  Left foot cellulitis and diabetic foot ulcer Resolving S/p left foot debridement by Dr. Darcus Pester on March 05, 2018  Podiatry, vascular, ID input appreciated Continue Omnicef/Flagyl per ID recommendations, physical therapy-for skilled nursing facility placement once bed is available  S/p angiogram on 03/05/2018 showing significant occlusions- repeat angiogram on 03/08/2018 with the successful revascularization by Dr. Lucky Cowboy        Percutaneous transluminal angioplasty ofleft posterior tibial artery with 3 mm diameter by 30 cm length angioplasty balloon and of the tibioperoneal trunk with 4 mm  diameter by 10 cm length Lutonix drug-coated angioplasty balloon Percutaneous transluminal angioplasty of the left popliteal artery and the entire SFA with 2 inflations with a 5 mm diameter by 22 cm length Lutonix drug-coated angioplasty balloon and a proximal inflation with a 6 mm diameter by 22cm length tonics drug-coated angioplasty balloon Placement of 2 Viabahn stents in the left SFA and popliteal artery with both being 6 mm diameter by 25 cm length stentswith pedal approach, if that is not successful, patient will need bypass surgery.  2.  Diabetes mellitus-sliding scale insulin HA1c of 8 Controlled on current regiment Metformin held while in house-restarted at discharge  3.  Chronic back pain Stable on current regiment   4.  Hypertension Stable on benazepril, and hydrochlorothiazide.  5.  Acute on chronic depression  Psychiatry consulted-no intervention recommended  6.  Constipation Resolved on current bowel regiment  Disposition to skilled nursing facility on day of discharge  DISCHARGE CONDITIONS:   stable  CONSULTS OBTAINED:  Treatment Team:  Samara Deist, DPM Tsosie Billing, MD [place], Madie Reno, MD  DRUG ALLERGIES:   Allergies  Allergen Reactions  . Amoxicillin Other (See Comments)    Has never taken amoxicillin -ENT told him not to take (? Had skin test) Has patient had a PCN reaction causing immediate rash, facial/tongue/throat swelling, SOB or lightheadedness with hypotension: No Has patient had a PCN reaction causing severe rash involving mucus membranes or skin necrosis: No Has patient had a PCN reaction that required hospitalization No Has patient  had a PCN reaction occurring within the last 10 years: No If above answers are "NO", then may proceed w/ Cephalo  . Other Anaphylaxis, Itching and Other (See Comments)    Pt states that he is allergic to Endopa.  Reaction:  Anaphylaxis  Pt states that he is allergic to  Metabisulfites Reaction:  Itching   . Penicillins Other (See Comments)    Arm turned "blue" at injection site (no treatment needed) Has patient had a PCN reaction causing immediate rash, facial/tongue/throat swelling, SOB or lightheadedness with hypotension: No Has patient had a PCN reaction causing severe rash involving mucus membranes or skin necrosis: No Has patient had a PCN reaction that required hospitalization No Has patient had a PCN reaction occurring within the last 10 years: No If all of the above answers are "NO", then may proceed with Cephalosporin use.  [name] Hydralazine Other (See Comments)    Reaction:  Cramping of extremities  . Crestor [Rosuvastatin Calcium] Other (See Comments)    Reaction:  Pt is unable to move arms/legs   . Metoprolol Nausea And Vomiting  . Red Dye Itching  . Statins Other (See Comments)    Reaction:  Pt is unable to move arms/legs  . Sulfa Antibiotics Itching    Other reaction(s): Unknown  . Yellow Dyes (Non-Tartrazine) Itching  . Aspirin Itching and Other (See Comments)    Pt states that he is able to use in lower doses.    . Tape Rash    Please use "paper" tape only. Please use "paper" tape only.    DISCHARGE MEDICATIONS:   Allergies as of 03/13/2018      Reactions   Amoxicillin Other (See Comments)   Has never taken amoxicillin -ENT told him not to take (? Had skin test) Has patient had a PCN reaction causing immediate rash, facial/tongue/throat swelling, SOB or lightheadedness with hypotension: No Has patient had a PCN reaction causing severe rash involving mucus membranes or skin necrosis: No Has patient had a PCN reaction that required hospitalization No Has patient had a PCN reaction occurring within the last 10 years: No If above answers are "NO", then may proceed w/ Cephalo   Other Anaphylaxis, Itching, Other (See Comments)   Pt states that he is allergic to Endopa.  Reaction:  Anaphylaxis  Pt states that he is allergic to  Metabisulfites Reaction:  Itching    Penicillins Other (See Comments)   Arm turned "blue" at injection site (no treatment needed) Has patient had a PCN reaction causing immediate rash, facial/tongue/throat swelling, SOB or lightheadedness with hypotension: No Has patient had a PCN reaction causing severe rash involving mucus membranes or skin necrosis: No Has patient had a PCN reaction that required hospitalization No Has patient had a PCN reaction occurring within the last 10 years: No If all of the above answers are "NO", then may proceed with Cephalosporin use.   Hydralazine Other (See Comments)   Reaction:  Cramping of extremities   Crestor [rosuvastatin Calcium] Other (See Comments)   Reaction:  Pt is unable to move arms/legs    Metoprolol Nausea And Vomiting   Red Dye Itching   Statins Other (See Comments)   Reaction:  Pt is unable to move arms/legs   Sulfa Antibiotics Itching   Other reaction(s): Unknown   Yellow Dyes (non-tartrazine) Itching   Aspirin Itching, Other (See Comments)   Pt states that he is able to use in lower doses.     Tape Rash   Please use "  paper" tape only. Please use "paper" tape only.      Medication List    TAKE these medications   aspirin EC 81 MG tablet Take 81 mg by mouth daily.   benazepril 20 MG tablet Commonly known as:  LOTENSIN TAKE 1 TABLET BY MOUTH TWICE DAILY   cefdinir 300 MG capsule Commonly known as:  OMNICEF Take 1 capsule (300 mg total) by mouth every 12 (twelve) hours.   clopidogrel 75 MG tablet Commonly known as:  PLAVIX Take 1 tablet (75 mg total) by mouth daily.   DULoxetine 60 MG capsule Commonly known as:  CYMBALTA Take 1 capsule (60 mg total) by mouth daily. What changed:    medication strength  how much to take  when to take this  additional instructions   Exenatide ER 2 MG Pen Inject 2 mg into the skin once a week. What changed:  when to take this   fenofibrate 54 MG tablet TAKE 1 TABLET BY MOUTH A  DAY AT SUPPERTIME What changed:    how much to take  how to take this  when to take this  additional instructions   fluticasone 50 MCG/ACT nasal spray Commonly known as:  FLONASE Place 2 sprays into both nostrils daily.   hydrochlorothiazide 12.5 MG tablet Commonly known as:  HYDRODIURIL Take 1 tablet (12.5 mg total) by mouth daily.   insulin aspart 100 UNIT/ML injection Commonly known as:  novoLOG Inject 0-5 Units into the skin at bedtime.   insulin glargine 100 UNIT/ML injection Commonly known as:  LANTUS Inject 0.6 mLs (60 Units total) into the skin daily. What changed:  when to take this   Insulin Pen Needle 31G X 5 MM Misc Use as directed with insulin e11.65   insulin regular 100 units/mL injection Commonly known as:  NOVOLIN R,HUMULIN R Patient to use Novolin R [place] TiD prior to meals per sliding scale instructions. Max daily dose is 45 units per day.   INSULIN SYRINGE 1CC/29G 29G X 1/2" 1 ML Misc Insulin injections QID, use with lantus and regular insulin. DX E11.65   Insulin Syringe-Needle U-100 31G X 5/16" 0.3 ML Misc Indulin injection QID. Use with lantus and regular insulin Dx. 11.65   methocarbamol 500 MG tablet Commonly known as:  ROBAXIN Take 1 tablet (500 mg total) by mouth every 8 (eight) hours as needed for muscle spasms.   metroNIDAZOLE 500 MG tablet Commonly known as:  FLAGYL Take 1 tablet (500 mg total) by mouth every 8 (eight) hours.   NARCAN 4 MG/0.1ML Liqd nasal spray kit Generic drug:  naloxone Take 4 mg by mouth as needed (for opioid overdose).   nitroGLYCERIN 0.4 MG SL tablet Commonly known as:  NITROSTAT Place 1 tablet (0.4 mg total) under the tongue every 5 (five) minutes x 3 doses as needed for chest pain.   omega-3 acid ethyl esters 1 g capsule Commonly known as:  LOVAZA Take 1 capsule (1 g total) by mouth 2 (two) times daily.   Oxcarbazepine 300 MG tablet Commonly known as:  TRILEPTAL Take 300-600 mg by mouth See admin  instructions. Take 1 tablet (300MG) by mouth every morning and 2 tablets (600MG) by mouth every night   oxyCODONE 20 mg 12 hr tablet Commonly known as:  OXYCONTIN Take 20 mg by mouth 2 (two) times daily. What changed:  Another medication with the same name was added. Make sure you understand how and when to take each.   oxyCODONE 5 MG immediate release tablet  Commonly known as:  Oxy IR/ROXICODONE Take 5 mg by mouth daily as needed (breakthrough pain). What changed:  Another medication with the same name was added. Make sure you understand how and when to take each.   oxyCODONE 5 MG immediate release tablet Commonly known as:  Oxy IR/ROXICODONE Take 1 tablet (5 mg total) by mouth every 4 (four) hours as needed for moderate pain. What changed:  You were already taking a medication with the same name, and this prescription was added. Make sure you understand how and when to take each.   pantoprazole 40 MG tablet Commonly known as:  PROTONIX TAKE 1 TABLET BY MOUTH A DAY FOR REFLUX What changed:    how much to take  how to take this  when to take this  additional instructions   polyethylene glycol packet Commonly known as:  MIRALAX / GLYCOLAX Take 17 g by mouth daily as needed.   pregabalin 200 MG capsule Commonly known as:  LYRICA Take 200 mg by mouth See admin instructions. Take 2 capsules (400MG) by mouth every morning and 1 capsule (200MG) by mouth every night   senna 8.6 MG Tabs tablet Commonly known as:  SENOKOT Take 1 tablet (8.6 mg total) by mouth 2 (two) times daily.   tamsulosin 0.4 MG Caps capsule Commonly known as:  FLOMAX Take 1 capsule (0.4 mg total) by mouth daily after supper.        DISCHARGE INSTRUCTIONS:  If you experience worsening of your admission symptoms, develop shortness of breath, life threatening emergency, suicidal or homicidal thoughts you must seek medical attention immediately by calling 911 or calling your MD immediately  if symptoms less  severe.  You Must read complete instructions/literature along with all the possible adverse reactions/side effects for all the Medicines you take and that have been prescribed to you. Take any new Medicines after you have completely understood and accept all the possible adverse reactions/side effects.   Please note  You were cared for by a hospitalist during your hospital stay. If you have any questions about your discharge medications or the care you received while you were in the hospital after you are discharged, you can call the unit and asked to speak with the hospitalist on call if the hospitalist that took care of you is not available. Once you are discharged, your primary care physician will handle any further medical issues. Please note that NO REFILLS for any discharge medications will be authorized once you are discharged, as it is imperative that you return to your primary care physician (or establish a relationship with a primary care physician if you do not have one) for your aftercare needs so that they can reassess your need for medications and monitor your lab values.    Today   CHIEF COMPLAINT:   Chief Complaint  Patient presents with  . Wound Infection  . Foot Pain    HISTORY OF PRESENT ILLNESS:  67 y.o. male who presents with chief complaint as above.  Patient presents the ED with complaint of left diabetic foot ulcer.  He states that he has had this wound for the past 3 weeks.  He initially saw Dr. Elvina Mattes and had some debridement done.  The wound had been healing afterwards.  Over the last 24 to 48 hours it began hurting, there was outside scabbing that fell off, and his foot started to turn erythematous.  He came to ED for evaluation today.  Work-up here is consistent with soft tissue  infection in that region with extending cellulitis.  Antibiotics were administered and hospitalist called for admission  VITAL SIGNS:  Blood pressure (!) 153/72, pulse 66, temperature  97.6 F (36.4 C), temperature source Oral, resp. rate 20, height 5' 8"  (1.727 m), weight 133.3 kg, SpO2 97 %.  I/O:    Intake/Output Summary (Last 24 hours) at 03/13/2018 1710 Last data filed at 03/13/2018 0900 Gross per 24 hour  Intake 240 ml  Output 850 ml  Net -610 ml    PHYSICAL EXAMINATION:  GENERAL:  67 y.o.-year-old patient lying in the bed with no acute distress.  EYES: Pupils equal, round, reactive to light and accommodation. No scleral icterus. Extraocular muscles intact.  HEENT: Head atraumatic, normocephalic. Oropharynx and nasopharynx clear.  NECK:  Supple, no jugular venous distention. No thyroid enlargement, no tenderness.  LUNGS: Normal breath sounds bilaterally, no wheezing, rales,rhonchi or crepitation. No use of accessory muscles of respiration.  CARDIOVASCULAR: S1, S2 normal. No murmurs, rubs, or gallops.  ABDOMEN: Soft, non-tender, non-distended. Bowel sounds present. No organomegaly or mass.  EXTREMITIES: No pedal edema, cyanosis, or clubbing.  NEUROLOGIC: Cranial nerves II through XII are intact. Muscle strength 5/5 in all extremities. Sensation intact. Gait not checked.  PSYCHIATRIC: The patient is alert and oriented x 3.  SKIN: No obvious rash, lesion, or ulcer.   DATA REVIEW:   CBC Recent Labs  Lab 03/10/18 0429  WBC 5.2  HGB 8.9*  HCT 28.7*  PLT 261    Chemistries  Recent Labs  Lab 03/08/18 0434  03/10/18 0429  NA 141   < > 142  K 3.9   < > 3.3*  CL 106   < > 107  CO2 30   < > 29  GLUCOSE 189*   < > 203*  BUN 17   < > 16  CREATININE 0.86   < > 0.87  CALCIUM 8.5*   < > 8.2*  MG 2.0  --   --    < > = values in this interval not displayed.    Cardiac Enzymes No results for input(s): TROPONINI in the last 168 hours.  Microbiology Results  Results for orders placed or performed during the hospital encounter of 03/01/18  Blood culture (routine x 2)     Status: None   Collection Time: 03/01/18  5:27 PM  Result Value Ref Range Status    Specimen Description BLOOD LEFT ANTECUBITAL  Final   Special Requests   Final    BOTTLES DRAWN AEROBIC AND ANAEROBIC Blood Culture adequate volume   Culture   Final    NO GROWTH 5 DAYS Performed at [facility], [place] [place] [place], [place] [address]    Report Status 03/06/2018 FINAL  Final  Blood culture (routine x 2)     Status: None   Collection Time: 03/01/18  5:27 PM  Result Value Ref Range Status   Specimen Description BLOOD RIGHT ANTECUBITAL  Final   Special Requests   Final    BOTTLES DRAWN AEROBIC AND ANAEROBIC Blood Culture results may not be optimal due to an excessive volume of blood received in culture bottles   Culture   Final    NO GROWTH 5 DAYS Performed at [facility], [address], [place], [place] [address]    Report Status 03/06/2018 FINAL  Final  Aerobic/Anaerobic Culture (surgical/deep wound)     Status: None   Collection Time: 03/02/18 11:26 AM  Result Value Ref Range Status   Specimen Description  Final    FOOT Performed at [facility], [address] [place], [place] [address]    Special Requests   Final    LEFT Performed at [facility], [address] [place], [place] [address]    Gram Stain   Final    RARE WBC PRESENT, PREDOMINANTLY PMN ABUNDANT GRAM POSITIVE COCCI FEW GRAM NEGATIVE RODS RARE GRAM POSITIVE RODS    Culture   Final    FEW ESCHERICHIA COLI FEW ENTEROCOCCUS FAECALIS MODERATE BACTEROIDES VULGATUS BETA LACTAMASE POSITIVE Performed at [facility], [address], [place],  [address]    Report Status 03/07/2018 FINAL  Final   Organism ID, Bacteria ESCHERICHIA COLI  Final   Organism ID, Bacteria ENTEROCOCCUS FAECALIS  Final      Susceptibility   Escherichia coli - MIC*    AMPICILLIN <=2 SENSITIVE Sensitive     CEFAZOLIN <=4 SENSITIVE Sensitive     CEFEPIME <=1 SENSITIVE Sensitive     CEFTAZIDIME <=1 SENSITIVE Sensitive     CEFTRIAXONE <=1 SENSITIVE Sensitive      CIPROFLOXACIN >=4 RESISTANT Resistant     GENTAMICIN <=1 SENSITIVE Sensitive     IMIPENEM <=0.25 SENSITIVE Sensitive     TRIMETH/SULFA <=20 SENSITIVE Sensitive     AMPICILLIN/SULBACTAM <=2 SENSITIVE Sensitive     PIP/TAZO <=4 SENSITIVE Sensitive     Extended ESBL NEGATIVE Sensitive     * FEW ESCHERICHIA COLI   Enterococcus faecalis - MIC*    AMPICILLIN <=2 SENSITIVE Sensitive     VANCOMYCIN 2 SENSITIVE Sensitive     GENTAMICIN SYNERGY RESISTANT Resistant     * FEW ENTEROCOCCUS FAECALIS    RADIOLOGY:  No results found.  EKG:   Orders placed or performed during the hospital encounter of 10/26/15  . EKG 12-Lead  . EKG 12-Lead  . EKG 12-Lead in am (before 8am)  . EKG 12-Lead in am (before 8am)  . EKG      Management plans discussed with the patient, family and they are in agreement.  CODE STATUS:     Code Status Orders  (From admission, onward)         Start     Ordered   03/01/18 2324  Full code  Continuous     03/01/18 2323        Code Status History    Date Active Date Inactive Code Status Order ID Comments User Context   02/18/2016 1628 02/21/2016 1556 Full Code [id number]  Theodoro Grist, MD ED   10/26/2015 2111 11/03/2015 1616 Full Code [id number]  Vaughan Basta, MD Inpatient   10/19/2015 2200 10/21/2015 2351 Full Code [id number]  Gladstone Lighter, MD Inpatient   12/19/2014 2131 12/21/2014 1710 Full Code [id number]  Charolette Forward, MD Inpatient   12/19/2014 2123 12/19/2014 2131 Full Code [id number]  Charolette Forward, MD Inpatient   12/19/2014 1238 12/19/2014 1718 Full Code [id number]  Dionisio Filippo, MD Inpatient   12/18/2014 1836 12/19/2014 1238 Full Code [id number]  Henreitta Leber, MD Inpatient    Advance Directive Documentation     Most Recent Value  Type of Advance Directive  Living will  Pre-existing out of facility DNR order (yellow form or pink MOST form)  -  "MOST" Form in Place?  -      TOTAL TIME TAKING CARE OF THIS PATIENT: 40 minutes.    Avel Peace Salary  M.D on 03/13/2018 at 5:10 PM  Between 7am to 6pm - Pager - [phone]  After 6pm go  to [email or web address] - password [email or web address] [facility]  Office  [phone]  CC: Primary care physician; Lavera Guise, MD   Note: This dictation was prepared with Dragon dictation along with smaller phrase technology. Any transcriptional errors that result from this process are unintentional.

## 2018-03-12 NOTE — Progress Notes (Signed)
Clinical Education officer, museum (CSW) met with patient at bedside to discuss D/C plan. Patient is agreeable to D/C to [facility]. CSW made patient aware that another [facility] authorization will have to be received because authorization expired last week. Per [facility] authorization is still pending.   [employer], LCSW [phone]

## 2018-03-12 NOTE — Progress Notes (Signed)
Clinical Education officer, museum (CSW) sent [facility] today's PT note as requested.   [employer], LCSW [phone]

## 2018-03-13 DIAGNOSIS — I739 Peripheral vascular disease, unspecified: Secondary | ICD-10-CM | POA: Diagnosis not present

## 2018-03-13 DIAGNOSIS — M6281 Muscle weakness (generalized): Secondary | ICD-10-CM | POA: Diagnosis not present

## 2018-03-13 DIAGNOSIS — N400 Enlarged prostate without lower urinary tract symptoms: Secondary | ICD-10-CM | POA: Diagnosis not present

## 2018-03-13 DIAGNOSIS — L02612 Cutaneous abscess of left foot: Secondary | ICD-10-CM | POA: Diagnosis not present

## 2018-03-13 DIAGNOSIS — M5489 Other dorsalgia: Secondary | ICD-10-CM | POA: Diagnosis not present

## 2018-03-13 DIAGNOSIS — Z5189 Encounter for other specified aftercare: Secondary | ICD-10-CM | POA: Diagnosis not present

## 2018-03-13 DIAGNOSIS — M86172 Other acute osteomyelitis, left ankle and foot: Secondary | ICD-10-CM | POA: Diagnosis not present

## 2018-03-13 DIAGNOSIS — E11628 Type 2 diabetes mellitus with other skin complications: Secondary | ICD-10-CM | POA: Diagnosis not present

## 2018-03-13 DIAGNOSIS — Z950 Presence of cardiac pacemaker: Secondary | ICD-10-CM | POA: Diagnosis not present

## 2018-03-13 DIAGNOSIS — F3289 Other specified depressive episodes: Secondary | ICD-10-CM | POA: Diagnosis not present

## 2018-03-13 DIAGNOSIS — K219 Gastro-esophageal reflux disease without esophagitis: Secondary | ICD-10-CM | POA: Diagnosis not present

## 2018-03-13 DIAGNOSIS — E785 Hyperlipidemia, unspecified: Secondary | ICD-10-CM | POA: Diagnosis not present

## 2018-03-13 DIAGNOSIS — E1151 Type 2 diabetes mellitus with diabetic peripheral angiopathy without gangrene: Secondary | ICD-10-CM | POA: Diagnosis not present

## 2018-03-13 DIAGNOSIS — E119 Type 2 diabetes mellitus without complications: Secondary | ICD-10-CM | POA: Diagnosis not present

## 2018-03-13 DIAGNOSIS — M79672 Pain in left foot: Secondary | ICD-10-CM | POA: Diagnosis not present

## 2018-03-13 DIAGNOSIS — D649 Anemia, unspecified: Secondary | ICD-10-CM | POA: Diagnosis not present

## 2018-03-13 DIAGNOSIS — F1110 Opioid abuse, uncomplicated: Secondary | ICD-10-CM | POA: Diagnosis not present

## 2018-03-13 DIAGNOSIS — I10 Essential (primary) hypertension: Secondary | ICD-10-CM | POA: Diagnosis not present

## 2018-03-13 DIAGNOSIS — F329 Major depressive disorder, single episode, unspecified: Secondary | ICD-10-CM | POA: Diagnosis not present

## 2018-03-13 DIAGNOSIS — Z7401 Bed confinement status: Secondary | ICD-10-CM | POA: Diagnosis not present

## 2018-03-13 LAB — GLUCOSE, CAPILLARY
GLUCOSE-CAPILLARY: 197 mg/dL — AB (ref 70–99)
GLUCOSE-CAPILLARY: 232 mg/dL — AB (ref 70–99)
GLUCOSE-CAPILLARY: 228 mg/dL — AB (ref 70–99)

## 2018-03-13 MED ORDER — PREMIER PROTEIN SHAKE: 11.0000 [oz_av] | Freq: Two times a day (BID) | ORAL | Status: DC

## 2018-03-13 MED ORDER — INSULIN ASPART 100 UNIT/ML ~~LOC~~ SOLN: 0.0000 [IU] | Freq: Every day | SUBCUTANEOUS | 0 refills | Status: DC

## 2018-03-13 MED ORDER — OCUVITE-LUTEIN PO CAPS: 1.0000 | ORAL_CAPSULE | Freq: Every day | ORAL | Status: DC

## 2018-03-13 MED ORDER — LIDOCAINE 5 % EX PTCH
1.0000 | MEDICATED_PATCH | CUTANEOUS | Status: DC
  Administered 2018-03-13: 1 via TRANSDERMAL
  Filled 2018-03-13: qty 1

## 2018-03-13 NOTE — Progress Notes (Signed)
Physical Therapy Treatment Patient Details Name: Victor Wise MRN: [id number] DOB: [birth year] Today's Date: 03/13/2018    History of Present Illness Patient admitted s/p I&D of medial L foot.  PMH includes stroke, spinal cord stimulator, MI, diabetes, depression and Htn.    PT Comments    Pt in bed, ready for session "I'll try."  Ortho and post op shoes donned.  Edema improved LLE today.  To edge of bed with increased time and mod a x 1. Once sitting, he is able to sit without support.  Once up, he stated he had just received muscle relaxer and it had not taken effect yet.  Education provided on letting staff know if he needs more time for it to work in order for most effective therapy session.  He voiced understanding.  Pt was able to stand x 3 today for very brief periods but was limited by pain in post knee/hamstring region.  Pt is typically supine in bed with 2 pillows under LE for elevation.  He externally rotates LLE and keeps knee flexed which he stated is the most comfortable for him.  Education provided on positioning to help stretch hamstrings and he was encouraged to try to keep LE straight on pillows with knee pointed to ceiling.  He voiced understanding.  He had a poor lateral scoot up in bed in sitting and required mod a x 2 with feet of bed elevated in order to slide about 1 foot up in bed. Lateral scoot  is not effective enough to allow for lateral scoot into recliner at this time.  "I would never get back to bed"  Returned to supine with min a x 1 which was improved today as he was able to lift his legs without assist and pull himself up in bed using headboard.  Once positioned in bed on pillows, he returned to external rotation with knee bent.  Reviewed positioning but he stated he was in too much pain to move it.  He was requesting pain medication and relayed to nursing.  Encouraged him to work on LE position as pain settles.    He remains unable to maintain NWB in standing but does  maintain PWB for short periods in standing. PEr orders NWB for gait so it should be deferred until he is able to maintain.      Follow Up Recommendations  SNF     Equipment Recommendations  None recommended by PT    Recommendations for Other Services       Precautions / Restrictions Precautions Precautions: Fall Restrictions Weight Bearing Restrictions: Yes LLE Weight Bearing: Partial weight bearing LLE Partial Weight Bearing Percentage or Pounds: 50% - for transfers only, otherwise NWB      Mobility  Bed Mobility Overal bed mobility: Needs Assistance Bed Mobility: Supine to Sit;Sit to Supine;Rolling Rolling: Min assist;Mod assist   Supine to sit: Mod assist Sit to supine: Mod assist      Transfers Overall transfer level: Needs assistance Equipment used: Rolling walker (2 wheeled) Transfers: Sit to/from Stand;Lateral/Scoot Transfers Sit to Stand: Mod assist;+2 physical assistance        Lateral/Scoot Transfers: Mod assist;From elevated surface;+2 physical assistance General transfer comment: was able to fully stand x 3 this session for approx 3 seconds each attempt, limited by L knee/hamstring pain  Ambulation/Gait             General Gait Details: limited to transfers only as pt is unable to maintain NWB    Stairs  Wheelchair Mobility    Modified Rankin (Stroke Patients Only)       Balance Overall balance assessment: Needs assistance Sitting-balance support: Feet supported;Bilateral upper extremity supported Sitting balance-Leahy Scale: Good     Standing balance support: Bilateral upper extremity supported Standing balance-Leahy Scale: Zero                              Cognition Arousal/Alertness: Awake/alert Behavior During Therapy: WFL for tasks assessed/performed Overall Cognitive Status: Within Functional Limits for tasks assessed                                        Exercises       General Comments        Pertinent Vitals/Pain Faces Pain Scale: Hurts whole lot Pain Location: L low leg Pain Descriptors / Indicators: Aching;Grimacing Pain Intervention(s): Premedicated before session;Monitored during session;Limited activity within patient's tolerance;Repositioned    Home Living                      Prior Function            PT Goals (current goals can now be found in the care plan section) Progress towards PT goals: Progressing toward goals    Frequency    Min 2X/week      PT Plan Current plan remains appropriate    Co-evaluation              AM-PAC PT "6 Clicks" Daily Activity  Outcome Measure  Difficulty turning over in bed (including adjusting bedclothes, sheets and blankets)?: Unable Difficulty moving from lying on back to sitting on the side of the bed? : Unable Difficulty sitting down on and standing up from a chair with arms (e.g., wheelchair, bedside commode, etc,.)?: Unable Help needed moving to and from a bed to chair (including a wheelchair)?: Total Help needed walking in hospital room?: Total Help needed climbing 3-5 steps with a railing? : Total 6 Click Score: 6    End of Session Equipment Utilized During Treatment: Gait belt Activity Tolerance: Patient limited by fatigue;Patient limited by pain Patient left: in bed;with call bell/phone within reach;with bed alarm set         Time: 1610-9604 PT Time Calculation (min) (ACUTE ONLY): 19 min  Charges:  $Therapeutic Activity: 8-22 mins                    Chesley Noon, PTA 03/13/18, 10:45 AM

## 2018-03-13 NOTE — Clinical Social Work Placement (Signed)
   CLINICAL SOCIAL WORK PLACEMENT  NOTE  Date:  03/13/2018  Patient Details  Name: Victor Wise MRN: [id number] Date of Birth: [birth year]  Clinical Social Work is seeking post-discharge placement for this patient at the [place] level of care (*CSW will initial, date and re-position this form in  chart as items are completed):  Yes   Patient/family provided with [facility] Work Department's list of facilities offering this level of care within the geographic area requested by the patient (or if unable, by the patient's family).  Yes   Patient/family informed of their freedom to choose among providers that offer the needed level of care, that participate in Medicare, Medicaid or managed care program needed by the patient, have an available bed and are willing to accept the patient.  Yes   Patient/family informed of [facility]'s ownership interest in [facility] and [facility], as well as of the fact that they are under no obligation to receive care at these facilities.  PASRR submitted to EDS on       PASRR number received on       Existing PASRR number confirmed on 03/04/18     FL2 transmitted to all facilities in geographic area requested by pt/family on 03/04/18     FL2 transmitted to all facilities within larger geographic area on       Patient informed that his/her managed care company has contracts with or will negotiate with certain facilities, including the following:        Yes   Patient/family informed of bed offers received.  Patient chooses bed at ([facility] )     Physician recommends and patient chooses bed at      Patient to be transferred to ([facility] ) on 03/13/18.  Patient to be transferred to facility by [facility] )     Patient family notified on 03/13/18 of transfer.  Name of family member notified:  (Patient's daughter [name] is aware of D/C today. )     PHYSICIAN       Additional Comment:     _______________________________________________ Korine Winton, Veronia Beets, LCSW 03/13/2018, 3:52 PM

## 2018-03-13 NOTE — Progress Notes (Signed)
Per [facility] authorization is still pending. Clinical Education officer, museum (CSW) sent today's PT note to [facility].   [employer], LCSW [phone]

## 2018-03-13 NOTE — Progress Notes (Signed)
[facility] [place] NAME: Victor Wise    MR#:  [id number]  DATE OF BIRTH:  [birth year]  SUBJECTIVE:  CHIEF COMPLAINT:   Chief Complaint  Patient presents with  . Wound Infection  . Foot Pain  Patient without complaint, awaiting bed at skilled nursing facility, has been refusing physical therapy while in house REVIEW OF SYSTEMS:  Review of Systems  Constitutional: Negative for chills and fever.  HENT: Negative for ear pain, hearing loss and tinnitus.   Eyes: Negative for blurred vision and double vision.  Respiratory: Negative for cough, shortness of breath and wheezing.   Cardiovascular: Positive for leg swelling. Negative for chest pain and palpitations.  Gastrointestinal: Negative for abdominal pain, constipation, diarrhea, nausea and vomiting.  Genitourinary: Negative for dysuria.  Musculoskeletal: Negative for myalgias.  Skin: Negative for rash.  Neurological: Positive for weakness. Negative for dizziness, focal weakness, seizures and headaches.  Psychiatric/Behavioral: Negative for depression.    DRUG ALLERGIES:   Allergies  Allergen Reactions  . Amoxicillin Other (See Comments)    Has never taken amoxicillin -ENT told him not to take (? Had skin test) Has patient had a PCN reaction causing immediate rash, facial/tongue/throat swelling, SOB or lightheadedness with hypotension: No Has patient had a PCN reaction causing severe rash involving mucus membranes or skin necrosis: No Has patient had a PCN reaction that required hospitalization No Has patient had a PCN reaction occurring within the last 10 years: No If above answers are "NO", then may proceed w/ Cephalo  . Other Anaphylaxis, Itching and Other (See Comments)    Pt states that he is allergic to Endopa.  Reaction:  Anaphylaxis  Pt states that he is allergic to Metabisulfites Reaction:  Itching   . Penicillins Other (See Comments)    Arm turned "blue" at injection site  (no treatment needed) Has patient had a PCN reaction causing immediate rash, facial/tongue/throat swelling, SOB or lightheadedness with hypotension: No Has patient had a PCN reaction causing severe rash involving mucus membranes or skin necrosis: No Has patient had a PCN reaction that required hospitalization No Has patient had a PCN reaction occurring within the last 10 years: No If all of the above answers are "NO", then may proceed with Cephalosporin use.  [name] Hydralazine Other (See Comments)    Reaction:  Cramping of extremities  . Crestor [Rosuvastatin Calcium] Other (See Comments)    Reaction:  Pt is unable to move arms/legs   . Metoprolol Nausea And Vomiting  . Red Dye Itching  . Statins Other (See Comments)    Reaction:  Pt is unable to move arms/legs  . Sulfa Antibiotics Itching    Other reaction(s): Unknown  . Yellow Dyes (Non-Tartrazine) Itching  . Aspirin Itching and Other (See Comments)    Pt states that he is able to use in lower doses.    . Tape Rash    Please use "paper" tape only. Please use "paper" tape only.    VITALS:  Blood pressure (!) 149/62, pulse 66, temperature 98 F (36.7 C), temperature source Oral, resp. rate 20, height 5\' 8"  (1.727 m), weight 133.3 kg, SpO2 98 %.  PHYSICAL EXAMINATION:  Physical Exam  GENERAL:  67 y.o.-year-old obese patient lying in the bed with no acute distress.  EYES: Pupils equal, round, reactive to light and accommodation. No scleral icterus. Extraocular muscles intact.  HEENT: Head atraumatic, normocephalic. Oropharynx and nasopharynx clear.  NECK:  Supple, no jugular venous distention.  No thyroid enlargement, no tenderness.  LUNGS: Normal breath sounds bilaterally, no wheezing, rales,rhonchi or crepitation. No use of accessory muscles of respiration.  Decreased bibasilar breath sounds CARDIOVASCULAR: S1, S2 normal. No murmurs, rubs, or gallops.  ABDOMEN: Soft, nontender, nondistended. Bowel sounds present. No organomegaly or  mass.  EXTREMITIES: No pedal edema, cyanosis, or clubbing.  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   - Left foot dressing in place. NEUROLOGIC: Cranial nerves II through XII are intact. Muscle strength 5/5 in all extremities. Sensation intact. Gait not checked.  PSYCHIATRIC: The patient is alert and oriented x 3.  SKIN: No obvious rash, lesion, or ulcer.    LABORATORY PANEL:   CBC Recent Labs  Lab 03/10/18 0429  WBC 5.2  HGB 8.9*  HCT 28.7*  PLT 261   ------------------------------------------------------------------------------------------------------------------  Chemistries  Recent Labs  Lab 03/08/18 0434  03/10/18 0429  NA 141   < > 142  K 3.9   < > 3.3*  CL 106   < > 107  CO2 30   < > 29  GLUCOSE 189*   < > 203*  BUN 17   < > 16  CREATININE 0.86   < > 0.87  CALCIUM 8.5*   < > 8.2*  MG 2.0  --   --    < > = values in this interval not displayed.   ------------------------------------------------------------------------------------------------------------------  Cardiac Enzymes No results for input(s): TROPONINI in the last 168 hours. ------------------------------------------------------------------------------------------------------------------  RADIOLOGY:  No results found.  EKG:   Orders placed or performed during the hospital encounter of 10/26/15  . EKG 12-Lead  . EKG 12-Lead  . EKG 12-Lead in am (before 8am)  . EKG 12-Lead in am (before 8am)  . EKG    ASSESSMENT AND PLAN:  67 year old male with past medical history significant for diabetes, hypertension, chronic low back pain and CAD resents to hospital secondary to left foot diabetic ulcer  1. Left foot cellulitis and diabetic foot ulcer Resolving S/p left foot debridement by Dr. Darcus Pester on March 05, 2018  Podiatry, vascular, ID input appreciated Continue Omnicef/Flagyl per ID recommendations, physical therapy-for skilled nursing facility placement once bed is  available S/p angiogram on 03/05/2018 showing significant occlusions- repeat angiogram on 03/08/2018 with the successful revascularization by Dr. Lucky Cowboy  Percutaneous transluminal angioplasty ofleft posterior tibial artery with 3 mm diameter by 30 cm length angioplasty balloon and of the tibioperoneal trunk with 4 mm diameter by 10 cm length Lutonix drug-coated angioplasty balloon Percutaneous transluminal angioplasty of the left popliteal artery and the entire SFA with 2 inflations with a 5 mm diameter by 22 cm length Lutonix drug-coated angioplasty balloon and a proximal inflation with a 6 mm diameter by 22cm length tonics drug-coated angioplasty balloon Placement of 2 Viabahn stents in the left SFA and popliteal artery with both being 6 mm diameter by 25 cm length stentswith pedal approach, if that is not successful, patient will need bypass surgery.  2. Diabetes mellitus-sliding scale insulin HA1c of 8 Controlled on current regiment Metformin held while in house-restarted at discharge  3. Chronic back pain Stable oncurrent regiment  4. Hypertension Stable on benazepril, and hydrochlorothiazide.  5. Acute on chronic depression  Psychiatry consulted-no intervention recommended  6. Constipation Resolved on current bowel regiment  Disposition to skilled nursing facility  when bed is available    All the records are reviewed and case discussed with Care Management/Social Workerr. Management plans discussed with the patient, family and they  are in agreement.  CODE STATUS: Full code  TOTAL TIME TAKING CARE OF THIS PATIENT: 36 minutes.   POSSIBLE D/C IN 1-2  DAYS, DEPENDING ON CLINICAL CONDITION.   Avel Peace Salary M.D on 03/13/2018 at 10:34 AM  Between 7am to 6pm - Pager - [phone]   After 6pm go to [email or web address] - password [email or web address] [facility]  Office  [phone]  CC: Primary care physician; Lavera Guise, MD

## 2018-03-13 NOTE — Progress Notes (Signed)
Patient is medically stable for D/C to [facility] today. Per [facility] authorization has been received and patient can come today to room 508. RN will call report and arrange EMS for transport. Clinical Education officer, museum (CSW) sent D/C orders to [facility] via [facility]. Patient is aware of above. Per patient his c-pap from home is in his room [facility] and will go to [facility] with him. CSW contacted patient's daughter [name] and made her aware of above. Please reconsult if future social work needs arise. CSW signing off.   [employer], LCSW [phone]

## 2018-03-14 ENCOUNTER — Telehealth (INDEPENDENT_AMBULATORY_CARE_PROVIDER_SITE_OTHER): Payer: Self-pay | Admitting: Vascular Surgery

## 2018-03-14 NOTE — Telephone Encounter (Signed)
Patient needs an appt to come in about 2-3 weeks.

## 2018-03-15 DIAGNOSIS — E1151 Type 2 diabetes mellitus with diabetic peripheral angiopathy without gangrene: Secondary | ICD-10-CM | POA: Diagnosis not present

## 2018-03-15 DIAGNOSIS — K219 Gastro-esophageal reflux disease without esophagitis: Secondary | ICD-10-CM | POA: Diagnosis not present

## 2018-03-15 DIAGNOSIS — M5489 Other dorsalgia: Secondary | ICD-10-CM | POA: Diagnosis not present

## 2018-03-15 DIAGNOSIS — I10 Essential (primary) hypertension: Secondary | ICD-10-CM | POA: Diagnosis not present

## 2018-03-15 DIAGNOSIS — L02612 Cutaneous abscess of left foot: Secondary | ICD-10-CM | POA: Diagnosis not present

## 2018-03-15 DIAGNOSIS — F329 Major depressive disorder, single episode, unspecified: Secondary | ICD-10-CM | POA: Diagnosis not present

## 2018-03-15 DIAGNOSIS — I739 Peripheral vascular disease, unspecified: Secondary | ICD-10-CM | POA: Diagnosis not present

## 2018-03-21 ENCOUNTER — Ambulatory Visit: Payer: Self-pay | Admitting: Adult Health

## 2018-03-22 DIAGNOSIS — K219 Gastro-esophageal reflux disease without esophagitis: Secondary | ICD-10-CM | POA: Diagnosis not present

## 2018-03-22 DIAGNOSIS — F329 Major depressive disorder, single episode, unspecified: Secondary | ICD-10-CM | POA: Diagnosis not present

## 2018-03-22 DIAGNOSIS — E119 Type 2 diabetes mellitus without complications: Secondary | ICD-10-CM | POA: Diagnosis not present

## 2018-03-22 DIAGNOSIS — I10 Essential (primary) hypertension: Secondary | ICD-10-CM | POA: Diagnosis not present

## 2018-03-22 DIAGNOSIS — I739 Peripheral vascular disease, unspecified: Secondary | ICD-10-CM | POA: Diagnosis not present

## 2018-03-26 DIAGNOSIS — M86172 Other acute osteomyelitis, left ankle and foot: Secondary | ICD-10-CM | POA: Diagnosis not present

## 2018-03-28 ENCOUNTER — Encounter (INDEPENDENT_AMBULATORY_CARE_PROVIDER_SITE_OTHER): Payer: Self-pay

## 2018-03-28 ENCOUNTER — Ambulatory Visit (INDEPENDENT_AMBULATORY_CARE_PROVIDER_SITE_OTHER): Payer: Self-pay | Admitting: Nurse Practitioner

## 2018-03-28 ENCOUNTER — Telehealth (INDEPENDENT_AMBULATORY_CARE_PROVIDER_SITE_OTHER): Payer: Self-pay

## 2018-03-28 ENCOUNTER — Other Ambulatory Visit (INDEPENDENT_AMBULATORY_CARE_PROVIDER_SITE_OTHER): Payer: Self-pay | Admitting: Vascular Surgery

## 2018-03-28 DIAGNOSIS — Z09 Encounter for follow-up examination after completed treatment for conditions other than malignant neoplasm: Secondary | ICD-10-CM

## 2018-03-28 NOTE — Telephone Encounter (Signed)
Patient called wanting to reschedule his appt due to not being able to get in the car.

## 2018-03-29 ENCOUNTER — Encounter (INDEPENDENT_AMBULATORY_CARE_PROVIDER_SITE_OTHER): Payer: Medicare HMO

## 2018-03-29 ENCOUNTER — Other Ambulatory Visit (INDEPENDENT_AMBULATORY_CARE_PROVIDER_SITE_OTHER): Payer: Self-pay | Admitting: Vascular Surgery

## 2018-03-29 ENCOUNTER — Encounter (INDEPENDENT_AMBULATORY_CARE_PROVIDER_SITE_OTHER): Payer: Self-pay | Admitting: Nurse Practitioner

## 2018-03-29 ENCOUNTER — Ambulatory Visit (INDEPENDENT_AMBULATORY_CARE_PROVIDER_SITE_OTHER): Payer: Medicare HMO

## 2018-03-29 ENCOUNTER — Ambulatory Visit (INDEPENDENT_AMBULATORY_CARE_PROVIDER_SITE_OTHER): Payer: Medicare HMO | Admitting: Nurse Practitioner

## 2018-03-29 VITALS — BP 109/74 | HR 64 | Resp 19 | Ht 68.5 in | Wt 275.0 lb

## 2018-03-29 DIAGNOSIS — R600 Localized edema: Secondary | ICD-10-CM

## 2018-03-29 DIAGNOSIS — I96 Gangrene, not elsewhere classified: Secondary | ICD-10-CM | POA: Diagnosis not present

## 2018-03-29 DIAGNOSIS — E782 Mixed hyperlipidemia: Secondary | ICD-10-CM

## 2018-03-29 DIAGNOSIS — I739 Peripheral vascular disease, unspecified: Secondary | ICD-10-CM

## 2018-03-29 DIAGNOSIS — Z09 Encounter for follow-up examination after completed treatment for conditions other than malignant neoplasm: Secondary | ICD-10-CM

## 2018-03-30 DIAGNOSIS — M62838 Other muscle spasm: Secondary | ICD-10-CM | POA: Diagnosis not present

## 2018-03-30 DIAGNOSIS — E11621 Type 2 diabetes mellitus with foot ulcer: Secondary | ICD-10-CM | POA: Diagnosis not present

## 2018-03-30 DIAGNOSIS — T8744 Infection of amputation stump, left lower extremity: Secondary | ICD-10-CM | POA: Diagnosis not present

## 2018-03-30 DIAGNOSIS — Z95820 Peripheral vascular angioplasty status with implants and grafts: Secondary | ICD-10-CM | POA: Diagnosis not present

## 2018-03-30 DIAGNOSIS — L97521 Non-pressure chronic ulcer of other part of left foot limited to breakdown of skin: Secondary | ICD-10-CM | POA: Diagnosis not present

## 2018-03-30 DIAGNOSIS — I10 Essential (primary) hypertension: Secondary | ICD-10-CM | POA: Diagnosis not present

## 2018-03-30 DIAGNOSIS — E1169 Type 2 diabetes mellitus with other specified complication: Secondary | ICD-10-CM | POA: Diagnosis not present

## 2018-03-30 DIAGNOSIS — M86172 Other acute osteomyelitis, left ankle and foot: Secondary | ICD-10-CM | POA: Diagnosis not present

## 2018-03-30 DIAGNOSIS — E1151 Type 2 diabetes mellitus with diabetic peripheral angiopathy without gangrene: Secondary | ICD-10-CM | POA: Diagnosis not present

## 2018-03-30 DIAGNOSIS — F341 Dysthymic disorder: Secondary | ICD-10-CM | POA: Diagnosis not present

## 2018-03-30 DIAGNOSIS — L97511 Non-pressure chronic ulcer of other part of right foot limited to breakdown of skin: Secondary | ICD-10-CM | POA: Diagnosis not present

## 2018-03-30 DIAGNOSIS — M549 Dorsalgia, unspecified: Secondary | ICD-10-CM | POA: Diagnosis not present

## 2018-03-30 DIAGNOSIS — I739 Peripheral vascular disease, unspecified: Secondary | ICD-10-CM | POA: Diagnosis not present

## 2018-03-30 DIAGNOSIS — L03116 Cellulitis of left lower limb: Secondary | ICD-10-CM | POA: Diagnosis not present

## 2018-03-31 DIAGNOSIS — L03116 Cellulitis of left lower limb: Secondary | ICD-10-CM | POA: Diagnosis not present

## 2018-03-31 DIAGNOSIS — E11621 Type 2 diabetes mellitus with foot ulcer: Secondary | ICD-10-CM | POA: Diagnosis not present

## 2018-03-31 DIAGNOSIS — M62838 Other muscle spasm: Secondary | ICD-10-CM | POA: Diagnosis not present

## 2018-03-31 DIAGNOSIS — Z95820 Peripheral vascular angioplasty status with implants and grafts: Secondary | ICD-10-CM | POA: Diagnosis not present

## 2018-03-31 DIAGNOSIS — E1169 Type 2 diabetes mellitus with other specified complication: Secondary | ICD-10-CM | POA: Diagnosis not present

## 2018-03-31 DIAGNOSIS — L97521 Non-pressure chronic ulcer of other part of left foot limited to breakdown of skin: Secondary | ICD-10-CM | POA: Diagnosis not present

## 2018-03-31 DIAGNOSIS — F341 Dysthymic disorder: Secondary | ICD-10-CM | POA: Diagnosis not present

## 2018-03-31 DIAGNOSIS — I10 Essential (primary) hypertension: Secondary | ICD-10-CM | POA: Diagnosis not present

## 2018-03-31 DIAGNOSIS — M549 Dorsalgia, unspecified: Secondary | ICD-10-CM | POA: Diagnosis not present

## 2018-04-02 ENCOUNTER — Telehealth (INDEPENDENT_AMBULATORY_CARE_PROVIDER_SITE_OTHER): Payer: Self-pay

## 2018-04-02 DIAGNOSIS — E1169 Type 2 diabetes mellitus with other specified complication: Secondary | ICD-10-CM | POA: Diagnosis not present

## 2018-04-02 DIAGNOSIS — L97521 Non-pressure chronic ulcer of other part of left foot limited to breakdown of skin: Secondary | ICD-10-CM | POA: Diagnosis not present

## 2018-04-02 DIAGNOSIS — Z95820 Peripheral vascular angioplasty status with implants and grafts: Secondary | ICD-10-CM | POA: Diagnosis not present

## 2018-04-02 DIAGNOSIS — M549 Dorsalgia, unspecified: Secondary | ICD-10-CM | POA: Diagnosis not present

## 2018-04-02 DIAGNOSIS — E11621 Type 2 diabetes mellitus with foot ulcer: Secondary | ICD-10-CM | POA: Diagnosis not present

## 2018-04-02 DIAGNOSIS — L03116 Cellulitis of left lower limb: Secondary | ICD-10-CM | POA: Diagnosis not present

## 2018-04-02 DIAGNOSIS — I10 Essential (primary) hypertension: Secondary | ICD-10-CM | POA: Diagnosis not present

## 2018-04-02 DIAGNOSIS — M62838 Other muscle spasm: Secondary | ICD-10-CM | POA: Diagnosis not present

## 2018-04-02 DIAGNOSIS — F341 Dysthymic disorder: Secondary | ICD-10-CM | POA: Diagnosis not present

## 2018-04-02 NOTE — Telephone Encounter (Signed)
FYI--Nurse called and stated that she was with the patient to change his dressing to his foot and that we have now place an Unna boot on the foot and leg and she needed to know what to do? Spoke to [place] and she stated that if they needed to take it off to do their dressing change, that they would need to place the [place] boot back on each time.  The nurse stated that she is having to change the foot dressing 3 times a week, so this will be the frequency of change to the Unna boot at least for this week.  She took a verbal order. But she stated that if his Unna wraps have to go past this week, that she would need a written order for them to continue wrapping him in the [employer].

## 2018-04-02 NOTE — Telephone Encounter (Signed)
He is scheduled to come in this week for an office visit with Dr. dew.  We will determine if he needs to continue Unna wraps past 04/06/2018.  If he does we will fax a written order to [facility] for continued Unna wraps.

## 2018-04-03 ENCOUNTER — Encounter: Payer: Self-pay | Admitting: Nurse Practitioner

## 2018-04-03 ENCOUNTER — Telehealth: Payer: Self-pay

## 2018-04-03 ENCOUNTER — Ambulatory Visit (INDEPENDENT_AMBULATORY_CARE_PROVIDER_SITE_OTHER): Payer: Self-pay | Admitting: Vascular Surgery

## 2018-04-03 DIAGNOSIS — Z95820 Peripheral vascular angioplasty status with implants and grafts: Secondary | ICD-10-CM | POA: Diagnosis not present

## 2018-04-03 DIAGNOSIS — I10 Essential (primary) hypertension: Secondary | ICD-10-CM | POA: Diagnosis not present

## 2018-04-03 DIAGNOSIS — F341 Dysthymic disorder: Secondary | ICD-10-CM | POA: Diagnosis not present

## 2018-04-03 DIAGNOSIS — E11621 Type 2 diabetes mellitus with foot ulcer: Secondary | ICD-10-CM | POA: Diagnosis not present

## 2018-04-03 DIAGNOSIS — E1169 Type 2 diabetes mellitus with other specified complication: Secondary | ICD-10-CM | POA: Diagnosis not present

## 2018-04-03 DIAGNOSIS — L97521 Non-pressure chronic ulcer of other part of left foot limited to breakdown of skin: Secondary | ICD-10-CM | POA: Diagnosis not present

## 2018-04-03 DIAGNOSIS — L03116 Cellulitis of left lower limb: Secondary | ICD-10-CM | POA: Diagnosis not present

## 2018-04-03 DIAGNOSIS — M62838 Other muscle spasm: Secondary | ICD-10-CM | POA: Diagnosis not present

## 2018-04-03 DIAGNOSIS — M549 Dorsalgia, unspecified: Secondary | ICD-10-CM | POA: Diagnosis not present

## 2018-04-03 NOTE — Telephone Encounter (Signed)
lmom for [facility] nurse to call us back

## 2018-04-03 NOTE — Telephone Encounter (Signed)
Spoke with [facility] Cherly Anderson [phone] gave sliding scale and verbal order for 1 once a week for 1 week and 1 once a week for 4 weeks

## 2018-04-04 ENCOUNTER — Encounter (INDEPENDENT_AMBULATORY_CARE_PROVIDER_SITE_OTHER): Payer: Self-pay | Admitting: Nurse Practitioner

## 2018-04-04 DIAGNOSIS — E1169 Type 2 diabetes mellitus with other specified complication: Secondary | ICD-10-CM | POA: Diagnosis not present

## 2018-04-04 DIAGNOSIS — M62838 Other muscle spasm: Secondary | ICD-10-CM | POA: Diagnosis not present

## 2018-04-04 DIAGNOSIS — Z95820 Peripheral vascular angioplasty status with implants and grafts: Secondary | ICD-10-CM | POA: Diagnosis not present

## 2018-04-04 DIAGNOSIS — M549 Dorsalgia, unspecified: Secondary | ICD-10-CM | POA: Diagnosis not present

## 2018-04-04 DIAGNOSIS — I10 Essential (primary) hypertension: Secondary | ICD-10-CM | POA: Diagnosis not present

## 2018-04-04 DIAGNOSIS — L03116 Cellulitis of left lower limb: Secondary | ICD-10-CM | POA: Diagnosis not present

## 2018-04-04 DIAGNOSIS — L97521 Non-pressure chronic ulcer of other part of left foot limited to breakdown of skin: Secondary | ICD-10-CM | POA: Diagnosis not present

## 2018-04-04 DIAGNOSIS — R600 Localized edema: Secondary | ICD-10-CM | POA: Insufficient documentation

## 2018-04-04 DIAGNOSIS — E11621 Type 2 diabetes mellitus with foot ulcer: Secondary | ICD-10-CM | POA: Diagnosis not present

## 2018-04-04 DIAGNOSIS — F341 Dysthymic disorder: Secondary | ICD-10-CM | POA: Diagnosis not present

## 2018-04-04 NOTE — Progress Notes (Signed)
Subjective:    Patient ID: Victor Wise, male    DOB: 1950-11-10, 67 y.o.   MRN: [id number] Chief Complaint  Patient presents with  . Follow-up    ABI follow up, no Unna boot    HPI  Victor Wise is a 67 y.o. male that underwent angioplasty of the left lower extremity on 03/08/2018.  The angioplasty the following vessels that intervention:  -Percutaneous transluminal angioplasty of left posterior tibial artery with 3 mm diameter by 30 cm length angioplasty balloon and of the tibioperoneal trunk with 4 mm diameter by 10 cm length Lutonix drug-coated angioplasty balloon -Percutaneous transluminal angioplasty of the left popliteal artery and the entire SFA with 2 inflations with a 5 mm diameter by 22 cm length Lutonix drug-coated angioplasty balloon and a proximal inflation with a 6 mm diameter by 22 cm length tonics drug-coated angioplasty balloon -Placement of 2 Viabahn stents in the left SFA and popliteal artery with both being 6 mm diameter by 25 cm length stents  The patient denies any claudication-like symptoms, however she also endorses having very little ability to get off the couch, get out of bed, due to decompensation.  He endorses swelling of his left lower extremity behind the area of the knee going down the calf.  Is not the entire calf however more so the medial to lateral portion.  Patient stated that this happened shortly after his angiogram.  No new ulcers or wounds have occurred since the last visit.  Patient also underwent a left great toe amputation on this day.  He is still getting dressings to his left foot.  Patient also has a right great toe amputation, and a area of scab darkened skin over his second right toe.  The patient states that this has been there for some time, likely several months.  The patient denies amaurosis fugax or recent TIA symptoms. There are no recent neurological changes noted. The patient denies history of DVT, PE or superficial  thrombophlebitis. The patient denies recent episodes of angina or shortness of breath.   ABI Rt=0.96 and Lt=1.30  (no comparison studies) The right tibial arteries have monophasic waveforms, the left tibial arteries display monophasic waveforms as well. Past Medical History:  Diagnosis Date  . Allergy   . Basal cell carcinoma    forehead  . BPH (benign prostatic hyperplasia)   . Chronic back pain   . Depression   . Diabetes ([place])   . GERD (gastroesophageal reflux disease)   . Heart attack ([place])   . Hypertension   . Morbid obesity ([place])   . Obstructive sleep apnea   . 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 Status post insertion of spinal cord stimulator   . Stroke ([place])   . UTI (lower urinary tract infection)     Past Surgical History:  Procedure Laterality Date  . CARDIAC CATHETERIZATION N/A 12/19/2014   Procedure: Coronary Stent Intervention;  Surgeon: Charolette Forward, MD;  Location: [facility];  Service: Cardiovascular;  Laterality: N/A;  . CARDIAC CATHETERIZATION Left 12/19/2014   Procedure: Left Heart Cath and Coronary Angiography;  Surgeon: Dionisio Bralyn, MD;  Location: [facility];  Service: Cardiovascular;  Laterality: Left;  . IRRIGATION AND DEBRIDEMENT FOOT Left 03/02/2018   Procedure: IRRIGATION AND DEBRIDEMENT FOOT;  Surgeon: Samara Deist, DPM;  Location: [facility];  Service: Podiatry;  Laterality: Left;  . IRRIGATION AND DEBRIDEMENT FOOT Left 03/08/2018   Procedure: IRRIGATION AND DEBRIDEMENT FOOT AND BONE;  Surgeon: Sharlotte Alamo, DPM;  Location: [facility];  Service: Podiatry;  Laterality: Left;  . LOWER EXTREMITY ANGIOGRAPHY Left 03/05/2018   Procedure: Lower Extremity Angiography;  Surgeon: Algernon Huxley, MD;  Location: [facility];  Service: Cardiovascular;  Laterality: Left;  . LOWER EXTREMITY ANGIOGRAPHY Left 03/08/2018   Procedure: LOWER EXTREMITY ANGIOGRAPHY;  Surgeon: Algernon Huxley, MD;  Location: [facility];  Service: Cardiovascular;   Laterality: Left;  . PACEMAKER INSERTION Left 11/02/2015   Procedure: INSERTION PACEMAKER;  Surgeon: Isaias Cowman, MD;  Location: [facility];  Service: Cardiovascular;  Laterality: Left;  . Pain Stimulator    . Right toe amputation      Social History   Socioeconomic History  . Marital status: Divorced    Spouse name: Not on file  . Number of children: Not on file  . Years of education: Not on file  . Highest education level: Not on file  Occupational History  . Not on file  Social Needs  . Financial resource strain: Not on file  . Food insecurity:    Worry: Not on file    Inability: Not on file  . Transportation needs:    Medical: Not on file    Non-medical: Not on file  Tobacco Use  . Smoking status: Former Smoker    Types: Cigarettes  . Smokeless tobacco: Never Used  . Tobacco comment: quit 45 years  Substance and Sexual Activity  . Alcohol use: Yes    Alcohol/week: 0.0 standard drinks    Comment: occasionally  . Drug use: No  . Sexual activity: Not on file  Lifestyle  . Physical activity:    Days per week: Not on file    Minutes per session: Not on file  . Stress: Not on file  Relationships  . Social connections:    Talks on phone: Not on file    Gets together: Not on file    Attends religious service: Not on file    Active member of club or organization: Not on file    Attends meetings of clubs or organizations: Not on file    Relationship status: Not on file  . Intimate partner violence:    Fear of current or ex partner: Not on file    Emotionally abused: Not on file    Physically abused: Not on file    Forced sexual activity: Not on file  Other Topics Concern  . Not on file  Social History Narrative  . Not on file    Family History  Problem Relation Age of Onset  . Breast cancer Mother   . Cancer Mother   . Hypertension Mother   . Lung cancer Father   . Hypertension Father   . Heart disease Father   . Cancer Father   . Kidney disease  Sister   . Prostate cancer Neg Hx     Allergies  Allergen Reactions  . Amoxicillin Other (See Comments)    Has never taken amoxicillin -ENT told him not to take (? Had skin test) Has patient had a PCN reaction causing immediate rash, facial/tongue/throat swelling, SOB or lightheadedness with hypotension: No Has patient had a PCN reaction causing severe rash involving mucus membranes or skin necrosis: No Has patient had a PCN reaction that required hospitalization No Has patient had a PCN reaction occurring within the last 10 years: No If above answers are "NO", then may proceed w/ Cephalo  . Other Anaphylaxis, Itching and Other (See Comments)  Pt states that he is allergic to Endopa.  Reaction:  Anaphylaxis  Pt states that he is allergic to Metabisulfites Reaction:  Itching   . Penicillins Other (See Comments)    Arm turned "blue" at injection site (no treatment needed) Has patient had a PCN reaction causing immediate rash, facial/tongue/throat swelling, SOB or lightheadedness with hypotension: No Has patient had a PCN reaction causing severe rash involving mucus membranes or skin necrosis: No Has patient had a PCN reaction that required hospitalization No Has patient had a PCN reaction occurring within the last 10 years: No If all of the above answers are "NO", then may proceed with Cephalosporin use.  [name] Hydralazine Other (See Comments)    Reaction:  Cramping of extremities  . Crestor [Rosuvastatin Calcium] Other (See Comments)    Reaction:  Pt is unable to move arms/legs   . Metoprolol Nausea And Vomiting  . Red Dye Itching  . Statins Other (See Comments)    Reaction:  Pt is unable to move arms/legs  . Sulfa Antibiotics Itching    Other reaction(s): Unknown  . Yellow Dyes (Non-Tartrazine) Itching  . Aspirin Itching and Other (See Comments)    Pt states that he is able to use in lower doses.    . Tape Rash    Please use "paper" tape only. Please use "paper" tape only.      Review of Systems   Review of Systems: Negative Unless Checked Constitutional: [] Weight loss  [] Fever  [] Chills Cardiac: [] Chest pain   []  Atrial Fibrillation  [] Palpitations   [] Shortness of breath when laying flat   [] Shortness of breath with exertion. Vascular:  [] Pain in legs with walking   [] Pain in legs with standing  [] History of DVT   [] Phlebitis   [] Swelling in legs   [] Varicose veins   [] Non-healing ulcers Pulmonary:   [] Uses home oxygen   [] Productive cough   [] Hemoptysis   [] Wheeze  [] COPD   [] Asthma Neurologic:  [] Dizziness   [] Seizures   [x] History of stroke   [] History of TIA  [] Aphasia   [] Vissual changes   [] Weakness or numbness in arm   [] Weakness or numbness in leg Musculoskeletal:   [] Joint swelling   [] Joint pain   [] Low back pain  []  History of Knee Replacement Hematologic:  [] Easy bruising  [] Easy bleeding   [] Hypercoagulable state   [] Anemic Gastrointestinal:  [] Diarrhea   [] Vomiting  [] Gastroesophageal reflux/heartburn   [] Difficulty swallowing. Genitourinary:  [] Chronic kidney disease   [] Difficult urination  [] Anuric   [] Blood in urine Skin:  [] Rashes   [] Ulcers  Psychological:  [x] History of anxiety   [x]  History of major depression  []  Memory Difficulties     Objective:   Physical Exam  BP 109/74 (BP Location: Left Arm, Patient Position: Sitting)   Pulse 64   Resp 19   Ht 5' 8.5" (1.74 m)   Wt 275 lb (124.7 kg)   BMI 41.21 kg/m   Gen: WD/WN, NAD poor dentition Head: [place]/AT, No temporalis wasting.  Ear/Nose/Throat: Hearing grossly intact, nares w/o erythema or drainage Eyes: PER, EOMI, sclera nonicteric.  Neck: Supple, no masses.  No JVD.  Pulmonary:  Good air movement, no use of accessory muscles.  Cardiac: RRR Vascular:  Amputation of left great toe.  Underneath there is been wound area that is currently being packed.  Hard area on left calf.  1+ edema Vessel Right Left  Radial Palpable Palpable   Gastrointestinal: soft, non-distended. No  guarding/no peritoneal signs.  Musculoskeletal:  Wheelchair-bound, left great toe amputation.  Right great toe amputation Neurologic: Pain and light touch intact in extremities.  Symmetrical.  Speech is fluent. Motor exam as listed above. Psychiatric: Judgment intact, Mood & affect appropriate for pt's clinical situation. Dermatologic: No Venous rashes.  Scabbed over area of second toe No changes consistent with cellulitis. Lymph : No Cervical lymphadenopathy, no lichenification or skin changes of chronic lymphedema.      Assessment & Plan:   1. Edema of left lower extremity Patient has an area of tenderness and hardness behind the left knee in the calf area.  The rest of the lower extremity is generally not edematous.  The patient does have evidence of a Baker's cyst behind this area.  I suspect that may be a ruptured Baker's cyst.  Is also possible it is due to reperfusion syndrome.  We will utilizing Unna wrap for 1 week to see if this changes the pain or hardness of the area. 2. Peripheral vascular disease (HCC) ABI Rt=0.96 and Lt=1.30  (no comparison studies) The right tibial arteries have monophasic waveforms, the left tibial arteries display monophasic waveforms as well.  Today the patient currently has ABIs within normal range, however with monophasic waveforms it is likely that he may need another angiogram.  Suspect that his second toe has some sort of ulcer under scab.  Patient hesitant to undergo revascularization at this time.  Patient currently denies any claudication-like symptoms, however this may be due to limited range of motion.  We will discuss this again with the patient when he follows up in 1 week.  3. Mixed hyperlipidemia Continue statin as ordered and reviewed, no changes at this time    Current Outpatient Medications on File Prior to Visit  Medication Sig Dispense Refill  . aspirin EC 81 MG tablet Take 81 mg by mouth daily.    . benazepril (LOTENSIN) 20 MG tablet  TAKE 1 TABLET BY MOUTH TWICE DAILY (Patient taking differently: Take 20 mg by mouth 2 (two) times daily. ) 60 tablet 2  . cefdinir (OMNICEF) 300 MG capsule Take 1 capsule (300 mg total) by mouth every 12 (twelve) hours. 20 capsule 0  . clopidogrel (PLAVIX) 75 MG tablet Take 1 tablet (75 mg total) by mouth daily. 30 tablet 11  . DULoxetine (CYMBALTA) 60 MG capsule Take 1 capsule (60 mg total) by mouth daily. 90 capsule 0  . Exenatide ER (BYDUREON) 2 MG PEN Inject 2 mg into the skin once a week. (Patient taking differently: Inject 2 mg into the skin every Wednesday. ) 4 each 5  . fenofibrate 54 MG tablet TAKE 1 TABLET BY MOUTH A DAY AT SUPPERTIME (Patient taking differently: Take 54 mg by mouth daily with supper. ) 30 tablet 5  . fluticasone (FLONASE) 50 MCG/ACT nasal spray Place 2 sprays into both nostrils daily.    . hydrochlorothiazide (HYDRODIURIL) 12.5 MG tablet Take 1 tablet (12.5 mg total) by mouth daily. 30 tablet 5  . insulin aspart (NOVOLOG) 100 UNIT/ML injection Inject 0-5 Units into the skin at bedtime. 10 mL 0  . insulin glargine (LANTUS) 100 UNIT/ML injection Inject 0.6 mLs (60 Units total) into the skin daily. (Patient taking differently: Inject 60 Units into the skin at bedtime. ) 10 mL 11  . insulin regular (NOVOLIN R) 100 units/mL injection Patient to use Novolin R Max TiD prior to meals per sliding scale instructions. Max daily dose is 45 units per day. 10 mL 11  . omega-3 acid ethyl esters (  LOVAZA) 1 g capsule Take 1 capsule (1 g total) by mouth 2 (two) times daily. 60 capsule 5  . Oxcarbazepine (TRILEPTAL) 300 MG tablet Take 300-600 mg by mouth See admin instructions. Take 1 tablet (300MG ) by mouth every morning and 2 tablets (600MG ) by mouth every night    . pantoprazole (PROTONIX) 40 MG tablet TAKE 1 TABLET BY MOUTH A DAY FOR REFLUX (Patient taking differently: Take 40 mg by mouth daily. ) 30 tablet 3  . polyethylene glycol (MIRALAX / GLYCOLAX) packet Take 17 g by mouth daily as  needed.    . pregabalin (LYRICA) 200 MG capsule Take 200 mg by mouth See admin instructions. Take 2 capsules (400MG ) by mouth every morning and 1 capsule (200MG ) by mouth every night    . senna (SENOKOT) 8.6 MG TABS tablet Take 1 tablet (8.6 mg total) by mouth 2 (two) times daily. 120 each 0  . tamsulosin (FLOMAX) 0.4 MG CAPS capsule Take 1 capsule (0.4 mg total) by mouth daily after supper. 30 capsule 3  . Insulin Pen Needle (B-D UF III MINI PEN NEEDLES) 31G X 5 MM MISC Use as directed with insulin e11.65 (Patient not taking: Reported on 03/29/2018) 100 each 3  . INSULIN SYRINGE 1CC/29G 29G X 1/2" 1 ML MISC Insulin injections QID, use with lantus and regular insulin. DX E11.65 (Patient not taking: Reported on 03/29/2018) 120 each 5  . Insulin Syringe-Needle U-100 (BD INSULIN SYRINGE U/F 1/2UNIT) 31G X 5/16" 0.3 ML MISC Indulin injection QID. Use with lantus and regular insulin Dx. 11.65 (Patient not taking: Reported on 03/29/2018) 400 each 3  . methocarbamol (ROBAXIN) 500 MG tablet Take 1 tablet (500 mg total) by mouth every 8 (eight) hours as needed for muscle spasms. (Patient not taking: Reported on 03/29/2018) 90 tablet 0  . NARCAN 4 MG/0.1ML LIQD Take 4 mg by mouth as needed (for opioid overdose).     . nitroGLYCERIN (NITROSTAT) 0.4 MG SL tablet Place 1 tablet (0.4 mg total) under the tongue every 5 (five) minutes x 3 doses as needed for chest pain. (Patient not taking: Reported on 03/29/2018) 25 tablet 1  . oxyCODONE (OXY IR/ROXICODONE) 5 MG immediate release tablet Take 1 tablet (5 mg total) by mouth every 4 (four) hours as needed for moderate pain. (Patient not taking: Reported on 03/29/2018) 30 tablet 0   No current facility-administered medications on file prior to visit.     There are no Patient Instructions on file for this visit. No follow-ups on file.   Kris Hartmann, NP  This note was completed with Sales executive.  Any errors are purely unintentional.

## 2018-04-05 DIAGNOSIS — M62838 Other muscle spasm: Secondary | ICD-10-CM | POA: Diagnosis not present

## 2018-04-05 DIAGNOSIS — L03116 Cellulitis of left lower limb: Secondary | ICD-10-CM | POA: Diagnosis not present

## 2018-04-05 DIAGNOSIS — L97521 Non-pressure chronic ulcer of other part of left foot limited to breakdown of skin: Secondary | ICD-10-CM | POA: Diagnosis not present

## 2018-04-05 DIAGNOSIS — M549 Dorsalgia, unspecified: Secondary | ICD-10-CM | POA: Diagnosis not present

## 2018-04-05 DIAGNOSIS — F341 Dysthymic disorder: Secondary | ICD-10-CM | POA: Diagnosis not present

## 2018-04-05 DIAGNOSIS — Z95820 Peripheral vascular angioplasty status with implants and grafts: Secondary | ICD-10-CM | POA: Diagnosis not present

## 2018-04-05 DIAGNOSIS — E1169 Type 2 diabetes mellitus with other specified complication: Secondary | ICD-10-CM | POA: Diagnosis not present

## 2018-04-05 DIAGNOSIS — E11621 Type 2 diabetes mellitus with foot ulcer: Secondary | ICD-10-CM | POA: Diagnosis not present

## 2018-04-05 DIAGNOSIS — I10 Essential (primary) hypertension: Secondary | ICD-10-CM | POA: Diagnosis not present

## 2018-04-06 ENCOUNTER — Encounter (INDEPENDENT_AMBULATORY_CARE_PROVIDER_SITE_OTHER): Payer: Self-pay | Admitting: Vascular Surgery

## 2018-04-06 ENCOUNTER — Ambulatory Visit (INDEPENDENT_AMBULATORY_CARE_PROVIDER_SITE_OTHER): Payer: Medicare HMO | Admitting: Vascular Surgery

## 2018-04-06 VITALS — BP 138/90 | HR 68 | Resp 15 | Ht 69.0 in | Wt 278.0 lb

## 2018-04-06 DIAGNOSIS — M7989 Other specified soft tissue disorders: Secondary | ICD-10-CM | POA: Insufficient documentation

## 2018-04-06 DIAGNOSIS — I7025 Atherosclerosis of native arteries of other extremities with ulceration: Secondary | ICD-10-CM | POA: Insufficient documentation

## 2018-04-06 DIAGNOSIS — L97919 Non-pressure chronic ulcer of unspecified part of right lower leg with unspecified severity: Secondary | ICD-10-CM | POA: Diagnosis not present

## 2018-04-06 DIAGNOSIS — E782 Mixed hyperlipidemia: Secondary | ICD-10-CM | POA: Diagnosis not present

## 2018-04-06 DIAGNOSIS — I70239 Atherosclerosis of native arteries of right leg with ulceration of unspecified site: Secondary | ICD-10-CM

## 2018-04-06 DIAGNOSIS — I70249 Atherosclerosis of native arteries of left leg with ulceration of unspecified site: Secondary | ICD-10-CM | POA: Diagnosis not present

## 2018-04-06 DIAGNOSIS — E1165 Type 2 diabetes mellitus with hyperglycemia: Secondary | ICD-10-CM

## 2018-04-06 DIAGNOSIS — R600 Localized edema: Secondary | ICD-10-CM | POA: Diagnosis not present

## 2018-04-06 DIAGNOSIS — I10 Essential (primary) hypertension: Secondary | ICD-10-CM | POA: Diagnosis not present

## 2018-04-06 DIAGNOSIS — L97929 Non-pressure chronic ulcer of unspecified part of left lower leg with unspecified severity: Secondary | ICD-10-CM

## 2018-04-06 NOTE — Assessment & Plan Note (Signed)
blood pressure control important in reducing the progression of atherosclerotic disease. On appropriate oral medications.  

## 2018-04-06 NOTE — Assessment & Plan Note (Signed)
Both lower extremities Recently, he had noninvasive study showing a left ABI of 1.3 and a right ABI of 0.96.  His left digital pressure was 85 and his waveforms were actually strong on the left.  His waveforms on the right were monophasic. He is going to see his podiatrist Monday.  I think he is going to require a right leg angiogram but he would like to wait until his left leg is doing better which I think is reasonable.  His perfusion is decent although not great on the right leg.  I will plan to see him back in about a month and will be happy to see him sooner if problems develop in the interim.

## 2018-04-06 NOTE — Assessment & Plan Note (Addendum)
Prominent on the left leg.  Likely a combination of reperfusion swelling as well as ruptured Baker's cyst.  We will continue the Unna boots for a few more weeks is continued ulceration and swelling.  A 3 layer Unna boot was placed on the left lower extremity today

## 2018-04-06 NOTE — Progress Notes (Signed)
MRN : [id number]  Victor Wise is a 67 y.o. ([birth year]) male who presents with chief complaint of  Chief Complaint  Patient presents with  . Follow-up    1 week Unna check  .  History of Present Illness: Patient returns today in follow up of PAD and bilateral lower extremity ulcerations.  His left leg swelling is a fair bit better with the Unna boot wraps.  The pain is better as well.  He still does have swelling on that side.  His ulceration on the foot persists but he says it is slowly getting better.  He is to see the podiatrist Monday.  He also has ulcerations on the right foot.  This is also reasonably stable and will be assessed by the podiatrist Monday.  Recently, he had noninvasive study showing a left ABI of 1.3 and a right ABI of 0.96.  His left digital pressure was 85 and his waveforms were actually strong on the left.  His waveforms on the right were monophasic.  Current Outpatient Medications  Medication Sig Dispense Refill  . aspirin EC 81 MG tablet Take 81 mg by mouth daily.    . benazepril (LOTENSIN) 20 MG tablet TAKE 1 TABLET BY MOUTH TWICE DAILY (Patient taking differently: Take 20 mg by mouth 2 (two) times daily. ) 60 tablet 2  . cefdinir (OMNICEF) 300 MG capsule Take 1 capsule (300 mg total) by mouth every 12 (twelve) hours. 20 capsule 0  . clopidogrel (PLAVIX) 75 MG tablet Take 1 tablet (75 mg total) by mouth daily. 30 tablet 11  . DULoxetine (CYMBALTA) 60 MG capsule Take 1 capsule (60 mg total) by mouth daily. 90 capsule 0  . Exenatide ER (BYDUREON) 2 MG PEN Inject 2 mg into the skin once a week. (Patient taking differently: Inject 2 mg into the skin every Wednesday. ) 4 each 5  . fenofibrate 54 MG tablet TAKE 1 TABLET BY MOUTH A DAY AT SUPPERTIME (Patient taking differently: Take 54 mg by mouth daily with supper. ) 30 tablet 5  . fluticasone (FLONASE) 50 MCG/ACT nasal spray Place 2 sprays into both nostrils daily.    . hydrochlorothiazide (HYDRODIURIL) 12.5 MG tablet  Take 1 tablet (12.5 mg total) by mouth daily. 30 tablet 5  . insulin aspart (NOVOLOG) 100 UNIT/ML injection Inject 0-5 Units into the skin at bedtime. 10 mL 0  . insulin glargine (LANTUS) 100 UNIT/ML injection Inject 0.6 mLs (60 Units total) into the skin daily. (Patient taking differently: Inject 60 Units into the skin at bedtime. ) 10 mL 11  . Insulin Pen Needle (B-D UF III MINI PEN NEEDLES) 31G X 5 MM MISC Use as directed with insulin e11.65 (Patient not taking: Reported on 03/29/2018) 100 each 3  . insulin regular (NOVOLIN R) 100 units/mL injection Patient to use Novolin R [place] TiD prior to meals per sliding scale instructions. Max daily dose is 45 units per day. 10 mL 11  . INSULIN SYRINGE 1CC/29G 29G X 1/2" 1 ML MISC Insulin injections QID, use with lantus and regular insulin. DX E11.65 (Patient not taking: Reported on 03/29/2018) 120 each 5  . Insulin Syringe-Needle U-100 (BD INSULIN SYRINGE U/F 1/2UNIT) 31G X 5/16" 0.3 ML MISC Indulin injection QID. Use with lantus and regular insulin Dx. 11.65 (Patient not taking: Reported on 03/29/2018) 400 each 3  . methocarbamol (ROBAXIN) 500 MG tablet Take 1 tablet (500 mg total) by mouth every 8 (eight) hours as needed for muscle spasms. (Patient not  taking: Reported on 03/29/2018) 90 tablet 0  . NARCAN 4 MG/0.1ML LIQD Take 4 mg by mouth as needed (for opioid overdose).     . nitroGLYCERIN (NITROSTAT) 0.4 MG SL tablet Place 1 tablet (0.4 mg total) under the tongue every 5 (five) minutes x 3 doses as needed for chest pain. (Patient not taking: Reported on 03/29/2018) 25 tablet 1  . omega-3 acid ethyl esters (LOVAZA) 1 g capsule Take 1 capsule (1 g total) by mouth 2 (two) times daily. 60 capsule 5  . Oxcarbazepine (TRILEPTAL) 300 MG tablet Take 300-600 mg by mouth See admin instructions. Take 1 tablet (300MG) by mouth every morning and 2 tablets (600MG) by mouth every night    . oxyCODONE (OXY IR/ROXICODONE) 5 MG immediate release tablet Take 1 tablet (5 mg  total) by mouth every 4 (four) hours as needed for moderate pain. (Patient not taking: Reported on 03/29/2018) 30 tablet 0  . pantoprazole (PROTONIX) 40 MG tablet TAKE 1 TABLET BY MOUTH A DAY FOR REFLUX (Patient taking differently: Take 40 mg by mouth daily. ) 30 tablet 3  . polyethylene glycol (MIRALAX / GLYCOLAX) packet Take 17 g by mouth daily as needed.    . pregabalin (LYRICA) 200 MG capsule Take 200 mg by mouth See admin instructions. Take 2 capsules (400MG) by mouth every morning and 1 capsule (200MG) by mouth every night    . senna (SENOKOT) 8.6 MG TABS tablet Take 1 tablet (8.6 mg total) by mouth 2 (two) times daily. 120 each 0  . tamsulosin (FLOMAX) 0.4 MG CAPS capsule Take 1 capsule (0.4 mg total) by mouth daily after supper. 30 capsule 3   No current facility-administered medications for this visit.     Past Medical History:  Diagnosis Date  . Allergy   . Basal cell carcinoma    forehead  . BPH (benign prostatic hyperplasia)   . Chronic back pain   . Depression   . Diabetes ([place])   . GERD (gastroesophageal reflux disease)   . Heart attack ([place])   . Hypertension   . Morbid obesity ([place])   . Obstructive sleep apnea   . 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 Status post insertion of spinal cord stimulator   . Stroke ([place])   . UTI (lower urinary tract infection)     Past Surgical History:  Procedure Laterality Date  . CARDIAC CATHETERIZATION N/A 12/19/2014   Procedure: Coronary Stent Intervention;  Surgeon: Charolette Forward, MD;  Location: [facility] [facility];  Service: Cardiovascular;  Laterality: N/A;  . CARDIAC CATHETERIZATION Left 12/19/2014   Procedure: Left Heart Cath and Coronary Angiography;  Surgeon: Dionisio Berthold, MD;  Location: [facility] [facility];  Service: Cardiovascular;  Laterality: Left;  . IRRIGATION AND DEBRIDEMENT FOOT Left 03/02/2018   Procedure: IRRIGATION AND DEBRIDEMENT FOOT;  Surgeon: Samara Deist, DPM;  Location: [facility];  Service: Podiatry;  Laterality:  Left;  . IRRIGATION AND DEBRIDEMENT FOOT Left 03/08/2018   Procedure: IRRIGATION AND DEBRIDEMENT FOOT AND BONE;  Surgeon: Sharlotte Alamo, DPM;  Location: [facility];  Service: Podiatry;  Laterality: Left;  . LOWER EXTREMITY ANGIOGRAPHY Left 03/05/2018   Procedure: Lower Extremity Angiography;  Surgeon: Algernon Huxley, MD;  Location: [facility];  Service: Cardiovascular;  Laterality: Left;  . LOWER EXTREMITY ANGIOGRAPHY Left 03/08/2018   Procedure: LOWER EXTREMITY ANGIOGRAPHY;  Surgeon: Algernon Huxley, MD;  Location: [facility];  Service: Cardiovascular;  Laterality: Left;  . PACEMAKER INSERTION Left 11/02/2015  Procedure: INSERTION PACEMAKER;  Surgeon: Isaias Cowman, MD;  Location: [facility];  Service: Cardiovascular;  Laterality: Left;  . Pain Stimulator    . Right toe amputation      Social History Social History   Tobacco Use  . Smoking status: Former Smoker    Types: Cigarettes  . Smokeless tobacco: Never Used  . Tobacco comment: quit 45 years  Substance Use Topics  . Alcohol use: Yes    Alcohol/week: 0.0 standard drinks    Comment: occasionally  . Drug use: No     Family History Family History  Problem Relation Age of Onset  . Breast cancer Mother   . Cancer Mother   . Hypertension Mother   . Lung cancer Father   . Hypertension Father   . Heart disease Father   . Cancer Father   . Kidney disease Sister   . Prostate cancer Neg Hx     Allergies  Allergen Reactions  . Amoxicillin Other (See Comments)    Has never taken amoxicillin -ENT told him not to take (? Had skin test) Has patient had a PCN reaction causing immediate rash, facial/tongue/throat swelling, SOB or lightheadedness with hypotension: No Has patient had a PCN reaction causing severe rash involving mucus membranes or skin necrosis: No Has patient had a PCN reaction that required hospitalization No Has patient had a PCN reaction occurring within the last 10 years: No If above answers are  "NO", then may proceed w/ Cephalo  . Other Anaphylaxis, Itching and Other (See Comments)    Pt states that he is allergic to Endopa.  Reaction:  Anaphylaxis  Pt states that he is allergic to Metabisulfites Reaction:  Itching   . Penicillins Other (See Comments)    Arm turned "blue" at injection site (no treatment needed) Has patient had a PCN reaction causing immediate rash, facial/tongue/throat swelling, SOB or lightheadedness with hypotension: No Has patient had a PCN reaction causing severe rash involving mucus membranes or skin necrosis: No Has patient had a PCN reaction that required hospitalization No Has patient had a PCN reaction occurring within the last 10 years: No If all of the above answers are "NO", then may proceed with Cephalosporin use.  [name] Hydralazine Other (See Comments)    Reaction:  Cramping of extremities  . Crestor [Rosuvastatin Calcium] Other (See Comments)    Reaction:  Pt is unable to move arms/legs   . Metoprolol Nausea And Vomiting  . Red Dye Itching  . Statins Other (See Comments)    Reaction:  Pt is unable to move arms/legs  . Sulfa Antibiotics Itching    Other reaction(s): Unknown  . Yellow Dyes (Non-Tartrazine) Itching  . Aspirin Itching and Other (See Comments)    Pt states that he is able to use in lower doses.    . Tape Rash    Please use "paper" tape only. Please use "paper" tape only.     REVIEW OF SYSTEMS (Negative unless checked)  Constitutional: _0 Weight loss  _1 Fever  _2 Chills Cardiac: _3 Chest pain   _4 Chest pressure   _5 Palpitations   _6 Shortness of breath when laying flat   _7 Shortness of breath at rest   _8 Shortness of breath with exertion. Vascular:  _9 Pain in legs with walking   _10 Pain in legs at rest   _11 Pain in legs when laying flat   _12 Claudication   _13 Pain in feet when walking  _14 Pain in feet at rest  _15 Pain in feet when laying flat   _16 History of DVT   _17   Phlebitis   _0 Swelling in legs   _1 Varicose veins   _2 Non-healing  ulcers Pulmonary:   _3 Uses home oxygen   _4 Productive cough   _5 Hemoptysis   _6 Wheeze  _7 COPD   _8 Asthma Neurologic:  _9 Dizziness  _10 Blackouts   _11 Seizures   _12 History of stroke   _13 History of TIA  _14 Aphasia   _15 Temporary blindness   _16 Dysphagia   _17 Weakness or numbness in arms   _18 Weakness or numbness in legs Musculoskeletal:  _19 Arthritis   _20 Joint swelling   _21 Joint pain   _22 Low back pain Hematologic:  _23 Easy bruising  _24 Easy bleeding   _25 Hypercoagulable state   _26 Anemic   Gastrointestinal:  _27 Blood in stool   _28 Vomiting blood  _29 Gastroesophageal reflux/heartburn   _30 Abdominal pain Genitourinary:  _31 Chronic kidney disease   _32 Difficult urination  _33 Frequent urination  _34 Burning with urination   _35 Hematuria Skin:  _36 Rashes   _37 Ulcers   _38 Wounds Psychological:  _39 History of anxiety   _40  History of major depression.  Physical Examination  BP 138/90 (BP Location: Right Arm, Patient Position: Sitting)   Pulse 68   Resp 15   Ht _41  (1.753 m)   Wt 278 lb (126.1 kg)   BMI 41.05 kg/m  Gen:  WD/WN, NAD Head: [place]/AT, No temporalis wasting. Ear/Nose/Throat: Hearing grossly intact, nares w/o erythema or drainage Eyes: Conjunctiva clear. Sclera non-icteric Neck: Supple.  Trachea midline Pulmonary:  Good air movement, no use of accessory muscles.  Cardiac: RRR, no JVD Vascular:  Vessel Right Left  Radial Palpable Palpable                          PT  not palpable  1+ palpable  DP  1+ palpable  trace palpable    Musculoskeletal: M/S 5/5 throughout.  No deformity or atrophy.  1-2+ left lower extremity edema.  1+ right lower extremity edema. Neurologic: Sensation grossly intact in extremities.  Symmetrical.  Speech is fluent.  Psychiatric: Judgment intact, Mood & affect appropriate for pt's clinical situation. Dermatologic: Open wound on the medial aspect of the left foot just below the metatarsal head.  This is packed today.  Mild surrounding erythema.  Dry scabs are present on the  right foot on multiple toes.       Labs Recent Results (from the past 2160 hour(s))  Basic metabolic panel     Status: Abnormal   Collection Time: 03/01/18  3:14 PM  Result Value Ref Range   Sodium 140 135 - 145 mmol/L   Potassium 4.3 3.5 - 5.1 mmol/L   Chloride 102 98 - 111 mmol/L   CO2 28 22 - 32 mmol/L   Glucose, Bld 127 (H) 70 - 99 mg/dL   BUN 16 8 - 23 mg/dL   Creatinine, Ser 0.91 0.61 - 1.24 mg/dL   Calcium 9.0 8.9 - 10.3 mg/dL   GFR calc non Af Amer >60 >60 mL/min   GFR calc Af Amer >60 >60 mL/min    Comment: (NOTE) The eGFR has been calculated using the CKD EPI equation. This calculation has not been validated in all clinical situations. eGFR's persistently <60 mL/min signify possible Chronic Kidney Disease.    Anion gap 10 5 - 15    Comment: Performed at [facility], [address] [place], [place] [address]  CBC     Status: Abnormal   Collection Time: 03/01/18  3:14 PM  Result Value Ref Range   WBC 9.1 4.0 - 10.5 K/uL   RBC 4.83 4.22 -  5.81 MIL/uL   Hemoglobin 12.3 (L) 13.0 - 17.0 g/dL   HCT 39.1 39.0 - 52.0 %   MCV 81.0 80.0 - 100.0 fL   MCH 25.5 (L) 26.0 - 34.0 pg   MCHC 31.5 30.0 - 36.0 g/dL   RDW 16.2 (H) 11.5 - 15.5 %   Platelets 276 150 - 400 K/uL   nRBC 0.0 0.0 - 0.2 %    Comment: Performed at [facility], [address] [place], [place] [address]  Blood culture (routine x 2)     Status: None   Collection Time: 03/01/18  5:27 PM  Result Value Ref Range   Specimen Description BLOOD LEFT ANTECUBITAL    Special Requests      BOTTLES DRAWN AEROBIC AND ANAEROBIC Blood Culture adequate volume   Culture      NO GROWTH 5 DAYS Performed at [facility], [address] [place], [place] [address]    Report Status 03/06/2018 FINAL   Blood culture (routine x 2)     Status: None   Collection Time: 03/01/18  5:27 PM  Result Value Ref Range   Specimen Description BLOOD RIGHT ANTECUBITAL    Special Requests      BOTTLES  DRAWN AEROBIC AND ANAEROBIC Blood Culture results may not be optimal due to an excessive volume of blood received in culture bottles   Culture      NO GROWTH 5 DAYS Performed at [facility], [address] [place], [place] [address]    Report Status 03/06/2018 FINAL   Glucose, capillary     Status: Abnormal   Collection Time: 03/02/18 12:21 AM  Result Value Ref Range   Glucose-Capillary 163 (H) 70 - 99 mg/dL  HIV antibody (Routine Testing)     Status: None   Collection Time: 03/02/18  4:35 AM  Result Value Ref Range   HIV Screen 4th Generation wRfx Non Reactive Non Reactive    Comment: (NOTE) Performed At: [facility] [place], [place] [id number] Rush Farmer MD AT:[id number]   Basic metabolic panel     Status: Abnormal   Collection Time: 03/02/18  4:35 AM  Result Value Ref Range   Sodium 139 135 - 145 mmol/L   Potassium 3.7 3.5 - 5.1 mmol/L   Chloride 102 98 - 111 mmol/L   CO2 28 22 - 32 mmol/L   Glucose, Bld 158 (H) 70 - 99 mg/dL   BUN 14 8 - 23 mg/dL   Creatinine, Ser 0.86 0.61 - 1.24 mg/dL   Calcium 8.5 (L) 8.9 - 10.3 mg/dL   GFR calc non Af Amer >60 >60 mL/min   GFR calc Af Amer >60 >60 mL/min    Comment: (NOTE) The eGFR has been calculated using the CKD EPI equation. This calculation has not been validated in all clinical situations. eGFR's persistently <60 mL/min signify possible Chronic Kidney Disease.    Anion gap 9 5 - 15    Comment: Performed at [facility], [address] [place], [place] [address]  CBC     Status: Abnormal   Collection Time: 03/02/18  4:35 AM  Result Value Ref Range   WBC 7.2 4.0 - 10.5 K/uL   RBC 4.23 4.22 - 5.81 MIL/uL   Hemoglobin 10.7 (L) 13.0 - 17.0 g/dL   HCT 33.8 (L) 39.0 - 52.0 %   MCV 79.9 (L) 80.0 - 100.0 fL   MCH 25.3 (L) 26.0 - 34.0 pg   MCHC 31.7 30.0 -  36.0 g/dL   RDW 16.2 (H) 11.5 - 15.5 %   Platelets 262 150 - 400 K/uL   nRBC 0.0 0.0 - 0.2 %    Comment: Performed at  [facility], [address] [place], [place] [address]  Glucose, capillary     Status: Abnormal   Collection Time: 03/02/18  5:42 AM  Result Value Ref Range   Glucose-Capillary 137 (H) 70 - 99 mg/dL  Glucose, capillary     Status: Abnormal   Collection Time: 03/02/18 10:34 AM  Result Value Ref Range   Glucose-Capillary 165 (H) 70 - 99 mg/dL  Aerobic/Anaerobic Culture (surgical/deep wound)     Status: None   Collection Time: 03/02/18 11:26 AM  Result Value Ref Range   Specimen Description      FOOT Performed at [facility], [address] [place], [place] [address]    Special Requests      LEFT Performed at [facility], [address] [place], [place] [address]    Gram Stain      RARE WBC PRESENT, PREDOMINANTLY PMN ABUNDANT GRAM POSITIVE COCCI FEW GRAM NEGATIVE RODS RARE GRAM POSITIVE RODS    Culture      FEW ESCHERICHIA COLI FEW ENTEROCOCCUS FAECALIS MODERATE BACTEROIDES VULGATUS BETA LACTAMASE POSITIVE Performed at [facility], [place] [address], [place], [place] [address]    Report Status 03/07/2018 FINAL    Organism ID, Bacteria ESCHERICHIA COLI    Organism ID, Bacteria ENTEROCOCCUS FAECALIS       Susceptibility   Escherichia coli - MIC*    AMPICILLIN <=2 SENSITIVE Sensitive     CEFAZOLIN <=4 SENSITIVE Sensitive     CEFEPIME <=1 SENSITIVE Sensitive     CEFTAZIDIME <=1 SENSITIVE Sensitive     CEFTRIAXONE <=1 SENSITIVE Sensitive     CIPROFLOXACIN >=4 RESISTANT Resistant     GENTAMICIN <=1 SENSITIVE Sensitive     IMIPENEM <=0.25 SENSITIVE Sensitive     TRIMETH/SULFA <=20 SENSITIVE Sensitive     AMPICILLIN/SULBACTAM <=2 SENSITIVE Sensitive     PIP/TAZO <=4 SENSITIVE Sensitive     Extended ESBL NEGATIVE Sensitive     * FEW ESCHERICHIA COLI   Enterococcus faecalis - MIC*    AMPICILLIN <=2 SENSITIVE Sensitive     VANCOMYCIN 2 SENSITIVE Sensitive     GENTAMICIN SYNERGY RESISTANT Resistant     * FEW ENTEROCOCCUS FAECALIS   Surgical pathology     Status: None   Collection Time: 03/02/18 11:32 AM  Result Value Ref Range   SURGICAL PATHOLOGY      Surgical Pathology CASE: [id number] PATIENT: Jerrell Steadham Surgical Pathology Report     SPECIMEN SUBMITTED: A. Sesamoid, left foot, infected  CLINICAL HISTORY: None provided  PRE-OPERATIVE DIAGNOSIS: Left foot infection  POST-OPERATIVE DIAGNOSIS: Same as pre-op     DIAGNOSIS: A. SESAMOID, LEFT FOOT; INCISION AND DRAINAGE: - OSTEOMYELITIS. - OSTEOMYELITIS INVOLVES UNORIENTED INKED CUT SURFACE OF BONE.   GROSS DESCRIPTION: A. Labeled: Left foot infected sesamoid Received: In formalin Size: 1.6 x 1.1 x 0.6 cm Description: Pink-tan fragment of bone, surface opposite smooth articular surface marked green, specimen sectioned  Block summary: 1 - entirely submitted  Tissue decalcification: Yes   Final Diagnosis performed by Quay Burow, MD.   Electronically signed 03/06/2018 11:52:07AM The electronic signature indicates that the named Attending Pathologist has evaluated the specimen  Technical component performed at [facility], [address] [place], [place] [address] Lab: [phone] Dir: Rush Farmer, MD, MMM  Professional component performed at  [employer], [facility], [address], [place], [place] [address] Lab: [phone] Dir: Dellia Nims. Rubinas, MD   Glucose, capillary     Status: Abnormal   Collection Time: 03/02/18 12:10 PM  Result Value Ref Range   Glucose-Capillary 195 (H) 70 - 99 mg/dL  Hemoglobin A1c     Status: Abnormal   Collection Time: 03/02/18  3:38 PM  Result Value Ref Range   Hgb A1c MFr Bld 8.0 (H) 4.8 - 5.6 %    Comment: (NOTE) Pre diabetes:          5.7%-6.4% Diabetes:              >6.4% Glycemic control for   <7.0% adults with diabetes    Mean Plasma Glucose 182.9 mg/dL    Comment: Performed at [facility] [address], [place], [place] [address]  Glucose, capillary     Status:  Abnormal   Collection Time: 03/02/18  6:12 PM  Result Value Ref Range   Glucose-Capillary 211 (H) 70 - 99 mg/dL   Comment 1 Notify RN   Glucose, capillary     Status: Abnormal   Collection Time: 03/02/18  7:58 PM  Result Value Ref Range   Glucose-Capillary 275 (H) 70 - 99 mg/dL   Comment 1 Notify RN   Glucose, capillary     Status: Abnormal   Collection Time: 03/02/18 11:49 PM  Result Value Ref Range   Glucose-Capillary 204 (H) 70 - 99 mg/dL   Comment 1 Notify RN   Glucose, capillary     Status: Abnormal   Collection Time: 03/03/18  6:10 AM  Result Value Ref Range   Glucose-Capillary 188 (H) 70 - 99 mg/dL   Comment 1 Notify RN   Basic metabolic panel     Status: Abnormal   Collection Time: 03/03/18  7:26 AM  Result Value Ref Range   Sodium 139 135 - 145 mmol/L   Potassium 4.1 3.5 - 5.1 mmol/L   Chloride 102 98 - 111 mmol/L   CO2 29 22 - 32 mmol/L   Glucose, Bld 200 (H) 70 - 99 mg/dL   BUN 14 8 - 23 mg/dL   Creatinine, Ser 1.05 0.61 - 1.24 mg/dL   Calcium 8.3 (L) 8.9 - 10.3 mg/dL   GFR calc non Af Amer >60 >60 mL/min   GFR calc Af Amer >60 >60 mL/min    Comment: (NOTE) The eGFR has been calculated using the CKD EPI equation. This calculation has not been validated in all clinical situations. eGFR's persistently <60 mL/min signify possible Chronic Kidney Disease.    Anion gap 8 5 - 15    Comment: Performed at [facility], [address] [place], [place] [address]  CBC     Status: Abnormal   Collection Time: 03/03/18  7:26 AM  Result Value Ref Range   WBC 6.4 4.0 - 10.5 K/uL   RBC 4.18 (L) 4.22 - 5.81 MIL/uL   Hemoglobin 10.5 (L) 13.0 - 17.0 g/dL   HCT 33.7 (L) 39.0 - 52.0 %   MCV 80.6 80.0 - 100.0 fL   MCH 25.1 (L) 26.0 - 34.0 pg   MCHC 31.2 30.0 - 36.0 g/dL   RDW 16.0 (H) 11.5 - 15.5 %   Platelets 225 150 - 400 K/uL   nRBC 0.0 0.0 - 0.2 %    Comment: Performed at [facility], [address] [place], [place] [address]  Vancomycin, trough  Status: None   Collection Time: 03/03/18  7:26 AM  Result Value Ref Range   Vancomycin Tr 17 15 - 20 ug/mL    Comment: Performed at [facility], [address] [place], [place] [address]  Glucose, capillary     Status: Abnormal   Collection Time: 03/03/18 12:02 PM  Result Value Ref Range   Glucose-Capillary 211 (H) 70 - 99 mg/dL  Glucose, capillary     Status: Abnormal   Collection Time: 03/03/18  4:43 PM  Result Value Ref Range   Glucose-Capillary 212 (H) 70 - 99 mg/dL   Comment 1 Notify RN   Glucose, capillary     Status: Abnormal   Collection Time: 03/03/18  9:12 PM  Result Value Ref Range   Glucose-Capillary 204 (H) 70 - 99 mg/dL   Comment 1 Notify RN   Glucose, capillary     Status: Abnormal   Collection Time: 03/03/18 11:42 PM  Result Value Ref Range   Glucose-Capillary 213 (H) 70 - 99 mg/dL   Comment 1 Notify RN   Glucose, capillary     Status: Abnormal   Collection Time: 03/04/18  6:55 AM  Result Value Ref Range   Glucose-Capillary 230 (H) 70 - 99 mg/dL   Comment 1 Notify RN   Glucose, capillary     Status: Abnormal   Collection Time: 03/04/18 11:45 AM  Result Value Ref Range   Glucose-Capillary 212 (H) 70 - 99 mg/dL  Glucose, capillary     Status: Abnormal   Collection Time: 03/04/18  4:50 PM  Result Value Ref Range   Glucose-Capillary 186 (H) 70 - 99 mg/dL   Comment 1 Notify RN   Glucose, capillary     Status: Abnormal   Collection Time: 03/04/18  9:09 PM  Result Value Ref Range   Glucose-Capillary 147 (H) 70 - 99 mg/dL   Comment 1 Notify RN   Glucose, capillary     Status: Abnormal   Collection Time: 03/05/18  7:56 AM  Result Value Ref Range   Glucose-Capillary 188 (H) 70 - 99 mg/dL   Comment 1 Notify RN   Glucose, capillary     Status: Abnormal   Collection Time: 03/05/18 11:40 AM  Result Value Ref Range   Glucose-Capillary 172 (H) 70 - 99 mg/dL   Comment 1 Notify RN   Glucose, capillary     Status: Abnormal   Collection Time: 03/05/18   2:04 PM  Result Value Ref Range   Glucose-Capillary 174 (H) 70 - 99 mg/dL  Glucose, capillary     Status: Abnormal   Collection Time: 03/05/18  4:13 PM  Result Value Ref Range   Glucose-Capillary 159 (H) 70 - 99 mg/dL  Glucose, capillary     Status: Abnormal   Collection Time: 03/05/18  9:26 PM  Result Value Ref Range   Glucose-Capillary 126 (H) 70 - 99 mg/dL   Comment 1 Notify RN   Basic metabolic panel     Status: Abnormal   Collection Time: 03/06/18  4:11 AM  Result Value Ref Range   Sodium 141 135 - 145 mmol/L   Potassium 3.8 3.5 - 5.1 mmol/L   Chloride 106 98 - 111 mmol/L   CO2 30 22 - 32 mmol/L   Glucose, Bld 206 (H) 70 - 99 mg/dL   BUN 19 8 - 23 mg/dL   Creatinine, Ser 0.86 0.61 - 1.24 mg/dL   Calcium 8.4 (L) 8.9 - 10.3 mg/dL   GFR calc non Af  Amer >60 >60 mL/min   GFR calc Af Amer >60 >60 mL/min    Comment: (NOTE) The eGFR has been calculated using the CKD EPI equation. This calculation has not been validated in all clinical situations. eGFR's persistently <60 mL/min signify possible Chronic Kidney Disease.    Anion gap 5 5 - 15    Comment: Performed at [facility], [address] [place], [place] [address]  Glucose, capillary     Status: Abnormal   Collection Time: 03/06/18  7:57 AM  Result Value Ref Range   Glucose-Capillary 173 (H) 70 - 99 mg/dL   Comment 1 Notify RN   Glucose, capillary     Status: Abnormal   Collection Time: 03/06/18 11:39 AM  Result Value Ref Range   Glucose-Capillary 170 (H) 70 - 99 mg/dL   Comment 1 Notify RN   Glucose, capillary     Status: Abnormal   Collection Time: 03/06/18  4:48 PM  Result Value Ref Range   Glucose-Capillary 175 (H) 70 - 99 mg/dL   Comment 1 Notify RN   Glucose, capillary     Status: Abnormal   Collection Time: 03/06/18  9:16 PM  Result Value Ref Range   Glucose-Capillary 180 (H) 70 - 99 mg/dL  Vancomycin, trough     Status: None   Collection Time: 03/07/18  4:19 AM  Result Value Ref Range    Vancomycin Tr 17 15 - 20 ug/mL    Comment: Performed at [facility], [address] [place], [place] [address]  Basic metabolic panel     Status: Abnormal   Collection Time: 03/07/18  4:19 AM  Result Value Ref Range   Sodium 141 135 - 145 mmol/L   Potassium 3.6 3.5 - 5.1 mmol/L   Chloride 106 98 - 111 mmol/L   CO2 27 22 - 32 mmol/L   Glucose, Bld 244 (H) 70 - 99 mg/dL   BUN 18 8 - 23 mg/dL   Creatinine, Ser 0.88 0.61 - 1.24 mg/dL   Calcium 8.5 (L) 8.9 - 10.3 mg/dL   GFR calc non Af Amer >60 >60 mL/min   GFR calc Af Amer >60 >60 mL/min    Comment: (NOTE) The eGFR has been calculated using the CKD EPI equation. This calculation has not been validated in all clinical situations. eGFR's persistently <60 mL/min signify possible Chronic Kidney Disease.    Anion gap 8 5 - 15    Comment: Performed at [facility], [address] [place], [place] [address]  Glucose, capillary     Status: Abnormal   Collection Time: 03/07/18  7:25 AM  Result Value Ref Range   Glucose-Capillary 175 (H) 70 - 99 mg/dL  Glucose, capillary     Status: Abnormal   Collection Time: 03/07/18 11:50 AM  Result Value Ref Range   Glucose-Capillary 155 (H) 70 - 99 mg/dL  Glucose, capillary     Status: Abnormal   Collection Time: 03/07/18  4:27 PM  Result Value Ref Range   Glucose-Capillary 174 (H) 70 - 99 mg/dL  Glucose, capillary     Status: Abnormal   Collection Time: 03/07/18  9:07 PM  Result Value Ref Range   Glucose-Capillary 152 (H) 70 - 99 mg/dL  CBC     Status: Abnormal   Collection Time: 03/08/18  4:34 AM  Result Value Ref Range   WBC 5.1 4.0 - 10.5 K/uL   RBC 3.73 (L) 4.22 - 5.81 MIL/uL   Hemoglobin 9.4 (L) 13.0 -  17.0 g/dL   HCT 30.1 (L) 39.0 - 52.0 %   MCV 80.7 80.0 - 100.0 fL   MCH 25.2 (L) 26.0 - 34.0 pg   MCHC 31.2 30.0 - 36.0 g/dL   RDW 16.0 (H) 11.5 - 15.5 %   Platelets 277 150 - 400 K/uL   nRBC 0.0 0.0 - 0.2 %    Comment: Performed at [facility], [address] [place], [place] [address]  Basic metabolic panel     Status: Abnormal   Collection Time: 03/08/18  4:34 AM  Result Value Ref Range   Sodium 141 135 - 145 mmol/L   Potassium 3.9 3.5 - 5.1 mmol/L   Chloride 106 98 - 111 mmol/L   CO2 30 22 - 32 mmol/L   Glucose, Bld 189 (H) 70 - 99 mg/dL   BUN 17 8 - 23 mg/dL   Creatinine, Ser 0.86 0.61 - 1.24 mg/dL   Calcium 8.5 (L) 8.9 - 10.3 mg/dL   GFR calc non Af Amer >60 >60 mL/min   GFR calc Af Amer >60 >60 mL/min    Comment: (NOTE) The eGFR has been calculated using the CKD EPI equation. This calculation has not been validated in all clinical situations. eGFR's persistently <60 mL/min signify possible Chronic Kidney Disease.    Anion gap 5 5 - 15    Comment: Performed at [facility], [address] [place], [place] [address]  Magnesium     Status: None   Collection Time: 03/08/18  4:34 AM  Result Value Ref Range   Magnesium 2.0 1.7 - 2.4 mg/dL    Comment: Performed at [facility], [address] [place], [place] [address]  Vancomycin, trough     Status: None   Collection Time: 03/08/18  7:38 AM  Result Value Ref Range   Vancomycin Tr 20 15 - 20 ug/mL    Comment: Performed at [facility], [address] [place], [place] [address]  Glucose, capillary     Status: Abnormal   Collection Time: 03/08/18  7:52 AM  Result Value Ref Range   Glucose-Capillary 167 (H) 70 - 99 mg/dL  Glucose, capillary     Status: Abnormal   Collection Time: 03/08/18  9:32 AM  Result Value Ref Range   Glucose-Capillary 166 (H) 70 - 99 mg/dL  Glucose, capillary     Status: Abnormal   Collection Time: 03/08/18 12:31 PM  Result Value Ref Range   Glucose-Capillary 164 (H) 70 - 99 mg/dL  Glucose, capillary     Status: Abnormal   Collection Time: 03/08/18  4:55 PM  Result Value Ref Range   Glucose-Capillary 157 (H) 70 - 99 mg/dL  Surgical pathology     Status: None   Collection Time: 03/08/18  6:14 PM  Result Value  Ref Range   SURGICAL PATHOLOGY      Surgical Pathology CASE: [id number] PATIENT: Zackary Salvato Surgical Pathology Report     SPECIMEN SUBMITTED: A. Toe joint, left great  CLINICAL HISTORY: None provided  PRE-OPERATIVE DIAGNOSIS: Exposed bone and ulceration on left foot  POST-OPERATIVE DIAGNOSIS: Same as pre-op     DIAGNOSIS:  A.  TOE JOINT, LEFT GREAT; EXCISION AND DEBRIDEMENT: - NECROTIC SOFT TISSUE WITH ULCERATION. - DEGENERATED BONE AND ARTICULAR CARTILAGE WITH FOCAL ACUTE OSTEOMYELITIS. - NEGATIVE FOR MALIGNANCY.  GROSS DESCRIPTION: A. Labeled: Left great toe joint Received: In formalin Tissue fragment(s): 2 Size: 2.4 x 2.2 x 1.3 cm and 1.9 x 1.6 x 0.9 cm  Description: Both fragments are tan-brown bony, one with a convex articular surface and one with a concave articular surface, the surface opposite the articular surface of the fragments is a smooth cut surface, smooth cut surfaces are differentially inked and sectioned Representative submitted in two cassettes.    Final [employer] performed by Moises Blood, MD.   Electronically signed 03/12/2018 12:53:58PM The electronic signature indicates that the named Attending Pathologist has evaluated the specimen  Technical component performed at [facility], [address], [place], [place] [address] Lab: [id number] Dir: Rush Farmer, MD, MMM  Professional component performed at [facility], [facility], [address], [place], [place] [address] Lab: [phone] Dir: Dellia Nims. Rubinas, MD   Glucose, capillary     Status: Abnormal   Collection Time: 03/08/18  6:58 PM  Result Value Ref Range   Glucose-Capillary 151 (H) 70 - 99 mg/dL  Glucose, capillary     Status: Abnormal   Collection Time: 03/08/18  9:20 PM  Result Value Ref Range   Glucose-Capillary 145 (H) 70 - 99 mg/dL   Comment 1 Notify RN   CBC     Status: Abnormal   Collection Time: 03/09/18  4:41 AM  Result Value Ref Range   WBC  5.2 4.0 - 10.5 K/uL   RBC 3.49 (L) 4.22 - 5.81 MIL/uL   Hemoglobin 8.7 (L) 13.0 - 17.0 g/dL   HCT 28.4 (L) 39.0 - 52.0 %   MCV 81.4 80.0 - 100.0 fL   MCH 24.9 (L) 26.0 - 34.0 pg   MCHC 30.6 30.0 - 36.0 g/dL   RDW 16.2 (H) 11.5 - 15.5 %   Platelets 269 150 - 400 K/uL   nRBC 0.0 0.0 - 0.2 %    Comment: Performed at [facility], [address] [place], [place] [address]  Basic metabolic panel     Status: Abnormal   Collection Time: 03/09/18  4:41 AM  Result Value Ref Range   Sodium 143 135 - 145 mmol/L   Potassium 3.6 3.5 - 5.1 mmol/L   Chloride 108 98 - 111 mmol/L   CO2 27 22 - 32 mmol/L   Glucose, Bld 155 (H) 70 - 99 mg/dL   BUN 17 8 - 23 mg/dL   Creatinine, Ser 0.77 0.61 - 1.24 mg/dL   Calcium 8.0 (L) 8.9 - 10.3 mg/dL   GFR calc non Af Amer >60 >60 mL/min   GFR calc Af Amer >60 >60 mL/min    Comment: (NOTE) The eGFR has been calculated using the CKD EPI equation. This calculation has not been validated in all clinical situations. eGFR's persistently <60 mL/min signify possible Chronic Kidney Disease.    Anion gap 8 5 - 15    Comment: Performed at [facility], [address] [place], [place] [address]  Glucose, capillary     Status: Abnormal   Collection Time: 03/09/18  8:25 AM  Result Value Ref Range   Glucose-Capillary 139 (H) 70 - 99 mg/dL  Glucose, capillary     Status: Abnormal   Collection Time: 03/09/18 12:02 PM  Result Value Ref Range   Glucose-Capillary 197 (H) 70 - 99 mg/dL  Glucose, capillary     Status: Abnormal   Collection Time: 03/09/18  4:50 PM  Result Value Ref Range   Glucose-Capillary 194 (H) 70 - 99 mg/dL  Glucose, capillary     Status: Abnormal   Collection Time: 03/09/18  9:03 PM  Result Value Ref Range   Glucose-Capillary 171 (H)  70 - 99 mg/dL  CBC     Status: Abnormal   Collection Time: 03/10/18  4:29 AM  Result Value Ref Range   WBC 5.2 4.0 - 10.5 K/uL   RBC 3.57 (L) 4.22 - 5.81 MIL/uL   Hemoglobin 8.9 (L) 13.0 -  17.0 g/dL   HCT 28.7 (L) 39.0 - 52.0 %   MCV 80.4 80.0 - 100.0 fL   MCH 24.9 (L) 26.0 - 34.0 pg   MCHC 31.0 30.0 - 36.0 g/dL   RDW 16.0 (H) 11.5 - 15.5 %   Platelets 261 150 - 400 K/uL   nRBC 0.0 0.0 - 0.2 %    Comment: Performed at [facility], [address] [place], [place] [address]  Basic metabolic panel     Status: Abnormal   Collection Time: 03/10/18  4:29 AM  Result Value Ref Range   Sodium 142 135 - 145 mmol/L   Potassium 3.3 (L) 3.5 - 5.1 mmol/L   Chloride 107 98 - 111 mmol/L   CO2 29 22 - 32 mmol/L   Glucose, Bld 203 (H) 70 - 99 mg/dL   BUN 16 8 - 23 mg/dL   Creatinine, Ser 0.87 0.61 - 1.24 mg/dL   Calcium 8.2 (L) 8.9 - 10.3 mg/dL   GFR calc non Af Amer >60 >60 mL/min   GFR calc Af Amer >60 >60 mL/min    Comment: (NOTE) The eGFR has been calculated using the CKD EPI equation. This calculation has not been validated in all clinical situations. eGFR's persistently <60 mL/min signify possible Chronic Kidney Disease.    Anion gap 6 5 - 15    Comment: Performed at [facility], [address] [place], [place] [address]  Glucose, capillary     Status: Abnormal   Collection Time: 03/10/18  8:24 AM  Result Value Ref Range   Glucose-Capillary 167 (H) 70 - 99 mg/dL   Comment 1 Notify RN   Glucose, capillary     Status: Abnormal   Collection Time: 03/10/18 11:52 AM  Result Value Ref Range   Glucose-Capillary 171 (H) 70 - 99 mg/dL   Comment 1 Notify RN   Glucose, capillary     Status: Abnormal   Collection Time: 03/10/18  4:57 PM  Result Value Ref Range   Glucose-Capillary 219 (H) 70 - 99 mg/dL   Comment 1 Notify RN   Glucose, capillary     Status: Abnormal   Collection Time: 03/10/18  9:04 PM  Result Value Ref Range   Glucose-Capillary 226 (H) 70 - 99 mg/dL   Comment 1 Notify RN   Glucose, capillary     Status: Abnormal   Collection Time: 03/11/18  7:57 AM  Result Value Ref Range   Glucose-Capillary 169 (H) 70 - 99 mg/dL   Comment 1 Notify  RN   Glucose, capillary     Status: Abnormal   Collection Time: 03/11/18 11:56 AM  Result Value Ref Range   Glucose-Capillary 195 (H) 70 - 99 mg/dL   Comment 1 Notify RN   Glucose, capillary     Status: Abnormal   Collection Time: 03/11/18  5:14 PM  Result Value Ref Range   Glucose-Capillary 167 (H) 70 - 99 mg/dL   Comment 1 Notify RN   Glucose, capillary     Status: Abnormal   Collection Time: 03/11/18  9:05 PM  Result Value Ref Range   Glucose-Capillary 183 (H) 70 - 99 mg/dL   Comment 1 Notify RN  Glucose, capillary     Status: Abnormal   Collection Time: 03/12/18  7:56 AM  Result Value Ref Range   Glucose-Capillary 182 (H) 70 - 99 mg/dL  Glucose, capillary     Status: Abnormal   Collection Time: 03/12/18 11:56 AM  Result Value Ref Range   Glucose-Capillary 201 (H) 70 - 99 mg/dL  Glucose, capillary     Status: Abnormal   Collection Time: 03/12/18  5:17 PM  Result Value Ref Range   Glucose-Capillary 215 (H) 70 - 99 mg/dL  Glucose, capillary     Status: Abnormal   Collection Time: 03/12/18  9:09 PM  Result Value Ref Range   Glucose-Capillary 220 (H) 70 - 99 mg/dL   Comment 1 Notify RN   Glucose, capillary     Status: Abnormal   Collection Time: 03/13/18  8:28 AM  Result Value Ref Range   Glucose-Capillary 197 (H) 70 - 99 mg/dL  Glucose, capillary     Status: Abnormal   Collection Time: 03/13/18 12:16 PM  Result Value Ref Range   Glucose-Capillary 232 (H) 70 - 99 mg/dL  Glucose, capillary     Status: Abnormal   Collection Time: 03/13/18  4:53 PM  Result Value Ref Range   Glucose-Capillary 228 (H) 70 - 99 mg/dL    Radiology US Venous Img Lower Unilateral Left  Result Date: 03/11/2018 CLINICAL DATA:  Left lower extremity pain and edema for the past day. Evaluate for DVT. EXAM: LEFT LOWER EXTREMITY VENOUS DOPPLER ULTRASOUND TECHNIQUE: Gray-scale sonography with graded compression, as well as color Doppler and duplex ultrasound were performed to evaluate the lower  extremity deep venous systems from the level of the common femoral vein and including the common femoral, femoral, profunda femoral, popliteal and calf veins including the posterior tibial, peroneal and gastrocnemius veins when visible. The superficial great saphenous vein was also interrogated. Spectral Doppler was utilized to evaluate flow at rest and with distal augmentation maneuvers in the common femoral, femoral and popliteal veins. COMPARISON:  None. FINDINGS: Contralateral Common Femoral Vein: Respiratory phasicity is normal and symmetric with the symptomatic side. No evidence of thrombus. Normal compressibility. Common Femoral Vein: No evidence of thrombus. Normal compressibility, respiratory phasicity and response to augmentation. Saphenofemoral Junction: No evidence of thrombus. Normal compressibility and flow on color Doppler imaging. Profunda Femoral Vein: No evidence of thrombus. Normal compressibility and flow on color Doppler imaging. Femoral Vein: No evidence of thrombus. Normal compressibility, respiratory phasicity and response to augmentation. Popliteal Vein: Patient experienced pain with attempted compression of the left popliteal vein however the vessel appears widely patent on color Doppler imaging. No evidence of thrombus. Normal respiratory phasicity and response to augmentation. Calf Veins: No evidence of thrombus. Normal compressibility and flow on color Doppler imaging. Superficial Great Saphenous Vein: No evidence of thrombus. Normal compressibility. Venous Reflux:  None. Other Findings: There is an approximately 3.1 x 1.4 x 0.5 cm fluid collection with the left popliteal fossa compatible with a Baker's cyst. IMPRESSION: 1. No definite evidence of DVT within left lower extremity. 2. Incidentally noted approximately 3.1 cm left-sided Baker's cyst. Electronically Signed   By: Sandi Mariscal M.D.   On: 03/11/2018 09:13   Abi With/wo Tbi  Result Date: 03/29/2018 LOWER EXTREMITY DOPPLER  STUDY Indications: Gangrene, and peripheral artery disease.  Performing Technologist: Charlane Ferretti RT (R)(VS)  Examination Guidelines: A complete evaluation includes at minimum, Doppler waveform signals and systolic blood pressure reading at the level of bilateral brachial, anterior tibial, and posterior tibial  arteries, when vessel segments are accessible. Bilateral testing is considered an integral part of a complete examination. Photoelectric Plethysmograph (PPG) waveforms and toe systolic pressure readings are included as required and additional duplex testing as needed. Limited examinations for reoccurring indications may be performed as noted.  ABI Findings: +---------+------------------+-----+----------+---------+ Right    Rt Pressure (mmHg)IndexWaveform  Comment   +---------+------------------+-----+----------+---------+ Brachial 135                                        +---------+------------------+-----+----------+---------+ ATA      93                0.69 monophasic          +---------+------------------+-----+----------+---------+ PTA      130               0.96 monophasic          +---------+------------------+-----+----------+---------+ Great Toe                                 amputated +---------+------------------+-----+----------+---------+ +---------+------------------+-----+----------+-------+ Left     Lt Pressure (mmHg)IndexWaveform  Comment +---------+------------------+-----+----------+-------+ Brachial 135                                      +---------+------------------+-----+----------+-------+ ATA      172               1.27 hyperemic         +---------+------------------+-----+----------+-------+ PTA      176               1.30 monophasic        +---------+------------------+-----+----------+-------+ Great Toe85                0.63 Abnormal          +---------+------------------+-----+----------+-------+  +-------+-----------+-----------+------------+------------+ ABI/TBIToday's ABIToday's TBIPrevious ABIPrevious TBI +-------+-----------+-----------+------------+------------+ Right  .96                                            +-------+-----------+-----------+------------+------------+ Left   1.30       .63                                 +-------+-----------+-----------+------------+------------+ Summary: Right: Resting right ankle-brachial index is within normal range. No evidence of significant right lower extremity arterial disease. Right great toe amputated. Second toe gangrenous. Left: Resting left ankle-brachial index is within normal range. No evidence of significant left lower extremity arterial disease. The left toe-brachial index is abnormal.  *See table(s) above for measurements and observations.  Electronically signed by Hortencia Pilar MD on 03/29/2018 at 5:22:57 PM.   Final     Assessment/Plan  Mixed hyperlipidemia lipid control important in reducing the progression of atherosclerotic disease. Continue statin therapy   Uncontrolled type 2 diabetes mellitus with hyperglycemia (HCC) blood glucose control important in reducing the progression of atherosclerotic disease. Also, involved in wound healing. On appropriate medications.   Hypertension blood pressure control important in reducing the progression of atherosclerotic disease. On appropriate oral medications.   Swelling of limb Prominent on the left  leg.  Likely a combination of reperfusion swelling as well as ruptured Baker's cyst.  We will continue the Unna boots for a few more weeks is continued ulceration and swelling.  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  His left digital pressure was 85 and  his waveforms were actually strong on the left.  His waveforms on the right were monophasic. He is going to see his podiatrist Monday.  I think he is going to require a right leg angiogram but he would like to wait until his left leg is doing better which I think is reasonable.  His perfusion is decent although not great on the right leg.  I will plan to see him back in about a month and will be happy to see him sooner if problems develop in the interim.    Leotis Pain, MD  04/06/2018 10:36 AM    This note was created with Dragon medical transcription system.  Any errors from dictation are purely unintentional

## 2018-04-06 NOTE — Assessment & Plan Note (Signed)
blood glucose control important in reducing the progression of atherosclerotic disease. Also, involved in wound healing. On appropriate medications.  

## 2018-04-06 NOTE — Assessment & Plan Note (Signed)
lipid control important in reducing the progression of atherosclerotic disease. Continue statin therapy  

## 2018-04-09 DIAGNOSIS — I10 Essential (primary) hypertension: Secondary | ICD-10-CM | POA: Diagnosis not present

## 2018-04-09 DIAGNOSIS — F341 Dysthymic disorder: Secondary | ICD-10-CM | POA: Diagnosis not present

## 2018-04-09 DIAGNOSIS — L03116 Cellulitis of left lower limb: Secondary | ICD-10-CM | POA: Diagnosis not present

## 2018-04-09 DIAGNOSIS — E11621 Type 2 diabetes mellitus with foot ulcer: Secondary | ICD-10-CM | POA: Diagnosis not present

## 2018-04-09 DIAGNOSIS — Z95820 Peripheral vascular angioplasty status with implants and grafts: Secondary | ICD-10-CM | POA: Diagnosis not present

## 2018-04-09 DIAGNOSIS — M62838 Other muscle spasm: Secondary | ICD-10-CM | POA: Diagnosis not present

## 2018-04-09 DIAGNOSIS — L97521 Non-pressure chronic ulcer of other part of left foot limited to breakdown of skin: Secondary | ICD-10-CM | POA: Diagnosis not present

## 2018-04-09 DIAGNOSIS — M549 Dorsalgia, unspecified: Secondary | ICD-10-CM | POA: Diagnosis not present

## 2018-04-09 DIAGNOSIS — E1169 Type 2 diabetes mellitus with other specified complication: Secondary | ICD-10-CM | POA: Diagnosis not present

## 2018-04-11 ENCOUNTER — Ambulatory Visit (INDEPENDENT_AMBULATORY_CARE_PROVIDER_SITE_OTHER): Payer: Medicare HMO | Admitting: Adult Health

## 2018-04-11 ENCOUNTER — Encounter (INDEPENDENT_AMBULATORY_CARE_PROVIDER_SITE_OTHER): Payer: Medicare HMO

## 2018-04-11 ENCOUNTER — Encounter: Payer: Self-pay | Admitting: Adult Health

## 2018-04-11 VITALS — BP 163/75 | HR 63 | Resp 16 | Ht 67.0 in | Wt 293.6 lb

## 2018-04-11 DIAGNOSIS — E11621 Type 2 diabetes mellitus with foot ulcer: Secondary | ICD-10-CM | POA: Diagnosis not present

## 2018-04-11 DIAGNOSIS — L089 Local infection of the skin and subcutaneous tissue, unspecified: Secondary | ICD-10-CM | POA: Diagnosis not present

## 2018-04-11 DIAGNOSIS — L97521 Non-pressure chronic ulcer of other part of left foot limited to breakdown of skin: Secondary | ICD-10-CM | POA: Diagnosis not present

## 2018-04-11 DIAGNOSIS — M549 Dorsalgia, unspecified: Secondary | ICD-10-CM | POA: Diagnosis not present

## 2018-04-11 DIAGNOSIS — E11628 Type 2 diabetes mellitus with other skin complications: Secondary | ICD-10-CM

## 2018-04-11 DIAGNOSIS — I10 Essential (primary) hypertension: Secondary | ICD-10-CM

## 2018-04-11 DIAGNOSIS — Z95820 Peripheral vascular angioplasty status with implants and grafts: Secondary | ICD-10-CM | POA: Diagnosis not present

## 2018-04-11 DIAGNOSIS — E1165 Type 2 diabetes mellitus with hyperglycemia: Secondary | ICD-10-CM | POA: Diagnosis not present

## 2018-04-11 DIAGNOSIS — I739 Peripheral vascular disease, unspecified: Secondary | ICD-10-CM | POA: Diagnosis not present

## 2018-04-11 DIAGNOSIS — F341 Dysthymic disorder: Secondary | ICD-10-CM | POA: Diagnosis not present

## 2018-04-11 DIAGNOSIS — M62838 Other muscle spasm: Secondary | ICD-10-CM | POA: Diagnosis not present

## 2018-04-11 DIAGNOSIS — L03116 Cellulitis of left lower limb: Secondary | ICD-10-CM | POA: Diagnosis not present

## 2018-04-11 DIAGNOSIS — E1169 Type 2 diabetes mellitus with other specified complication: Secondary | ICD-10-CM | POA: Diagnosis not present

## 2018-04-11 NOTE — Progress Notes (Signed)
[facility] [place], [place] [address]  Internal MEDICINE  Office Visit Note  Patient Name: Victor Wise  [id number]  [id number]  Date of Service: 04/11/2018  Chief Complaint  Patient presents with  . Medical Management of Chronic Issues    hospital follow up.pt is currently on antibiotics and they are making him itch, he mentiond that he is allergic to penicillin    HPI Pt is here for follow up.  Last month he was admitted to the hospital for his left foot cellulitis secondary to a diabetic foot ulcer.  While in the hospital the patient had multiple vascular procedures in attempt to improve his blood flow to his foot.  Once these revascularization procedures were complete the podiatrist Dr.Clein debrided and ulceration that had exposed bone on his left first metatarsal.  The patient currently has his foot wrapped by wound care nurse adjusting to his home yesterday.  They will continue to follow him at this time.    Current Medication: Outpatient Encounter Medications as of 04/11/2018  Medication Sig  . aspirin EC 81 MG tablet Take 81 mg by mouth daily.  . benazepril (LOTENSIN) 20 MG tablet TAKE 1 TABLET BY MOUTH TWICE DAILY (Patient taking differently: Take 20 mg by mouth 2 (two) times daily. )  . cefdinir (OMNICEF) 300 MG capsule Take 1 capsule (300 mg total) by mouth every 12 (twelve) hours.  . clopidogrel (PLAVIX) 75 MG tablet Take 1 tablet (75 mg total) by mouth daily.  . DULoxetine (CYMBALTA) 60 MG capsule Take 1 capsule (60 mg total) by mouth daily.  . Exenatide ER (BYDUREON) 2 MG PEN Inject 2 mg into the skin once a week. (Patient taking differently: Inject 2 mg into the skin every Wednesday. )  . fenofibrate 54 MG tablet TAKE 1 TABLET BY MOUTH A DAY AT SUPPERTIME (Patient taking differently: Take 54 mg by mouth daily with supper. )  . fluticasone (FLONASE) 50 MCG/ACT nasal spray Place 2 sprays into both nostrils daily.  . hydrochlorothiazide  (HYDRODIURIL) 12.5 MG tablet Take 1 tablet (12.5 mg total) by mouth daily.  . insulin aspart (NOVOLOG) 100 UNIT/ML injection Inject 0-5 Units into the skin at bedtime.  . insulin glargine (LANTUS) 100 UNIT/ML injection Inject 0.6 mLs (60 Units total) into the skin daily. (Patient taking differently: Inject 60 Units into the skin at bedtime. )  . insulin regular (NOVOLIN R) 100 units/mL injection Patient to use Novolin R Tibes TiD prior to meals per sliding scale instructions. Max daily dose is 45 units per day.  [name] NARCAN 4 MG/0.1ML LIQD Take 4 mg by mouth as needed (for opioid overdose).   [name] omega-3 acid ethyl esters (LOVAZA) 1 g capsule Take 1 capsule (1 g total) by mouth 2 (two) times daily.  . Oxcarbazepine (TRILEPTAL) 300 MG tablet Take 300-600 mg by mouth See admin instructions. Take 1 tablet (300MG ) by mouth every morning and 2 tablets (600MG ) by mouth every night  . pantoprazole (PROTONIX) 40 MG tablet TAKE 1 TABLET BY MOUTH A DAY FOR REFLUX (Patient taking differently: Take 40 mg by mouth daily. )  . polyethylene glycol (MIRALAX / GLYCOLAX) packet Take 17 g by mouth daily as needed.  . pregabalin (LYRICA) 200 MG capsule Take 200 mg by mouth See admin instructions. Take 2 capsules (400MG ) by mouth every morning and 1 capsule (200MG ) by mouth every night  . senna (SENOKOT) 8.6 MG TABS tablet Take 1 tablet (8.6 mg total) by mouth 2 (  two) times daily.  . tamsulosin (FLOMAX) 0.4 MG CAPS capsule Take 1 capsule (0.4 mg total) by mouth daily after supper.  . Insulin Pen Needle (B-D UF III MINI PEN NEEDLES) 31G X 5 MM MISC Use as directed with insulin e11.65 (Patient not taking: Reported on 03/29/2018)  . INSULIN SYRINGE 1CC/29G 29G X 1/2" 1 ML MISC Insulin injections QID, use with lantus and regular insulin. DX E11.65 (Patient not taking: Reported on 03/29/2018)  . Insulin Syringe-Needle U-100 (BD INSULIN SYRINGE U/F 1/2UNIT) 31G X 5/16" 0.3 ML MISC Indulin injection QID. Use with lantus and regular  insulin Dx. 11.65 (Patient not taking: Reported on 03/29/2018)  . methocarbamol (ROBAXIN) 500 MG tablet Take 1 tablet (500 mg total) by mouth every 8 (eight) hours as needed for muscle spasms. (Patient not taking: Reported on 03/29/2018)  . nitroGLYCERIN (NITROSTAT) 0.4 MG SL tablet Place 1 tablet (0.4 mg total) under the tongue every 5 (five) minutes x 3 doses as needed for chest pain. (Patient not taking: Reported on 03/29/2018)  . oxyCODONE (OXY IR/ROXICODONE) 5 MG immediate release tablet Take 1 tablet (5 mg total) by mouth every 4 (four) hours as needed for moderate pain. (Patient not taking: Reported on 03/29/2018)   No facility-administered encounter medications on file as of 04/11/2018.     Surgical History: Past Surgical History:  Procedure Laterality Date  . CARDIAC CATHETERIZATION N/A 12/19/2014   Procedure: Coronary Stent Intervention;  Surgeon: Charolette Forward, MD;  Location: [place] [facility];  Service: Cardiovascular;  Laterality: N/A;  . CARDIAC CATHETERIZATION Left 12/19/2014   Procedure: Left Heart Cath and Coronary Angiography;  Surgeon: Dionisio Lucas, MD;  Location: [place] [facility];  Service: Cardiovascular;  Laterality: Left;  . IRRIGATION AND DEBRIDEMENT FOOT Left 03/02/2018   Procedure: IRRIGATION AND DEBRIDEMENT FOOT;  Surgeon: Samara Deist, DPM;  Location: [facility];  Service: Podiatry;  Laterality: Left;  . IRRIGATION AND DEBRIDEMENT FOOT Left 03/08/2018   Procedure: IRRIGATION AND DEBRIDEMENT FOOT AND BONE;  Surgeon: Sharlotte Alamo, DPM;  Location: [facility];  Service: Podiatry;  Laterality: Left;  . LOWER EXTREMITY ANGIOGRAPHY Left 03/05/2018   Procedure: Lower Extremity Angiography;  Surgeon: Algernon Huxley, MD;  Location: [place] [facility];  Service: Cardiovascular;  Laterality: Left;  . LOWER EXTREMITY ANGIOGRAPHY Left 03/08/2018   Procedure: LOWER EXTREMITY ANGIOGRAPHY;  Surgeon: Algernon Huxley, MD;  Location: [place] [facility];  Service: Cardiovascular;   Laterality: Left;  . PACEMAKER INSERTION Left 11/02/2015   Procedure: INSERTION PACEMAKER;  Surgeon: Isaias Cowman, MD;  Location: [facility];  Service: Cardiovascular;  Laterality: Left;  . Pain Stimulator    . Right toe amputation      Medical History: Past Medical History:  Diagnosis Date  . Allergy   . Basal cell carcinoma    forehead  . BPH (benign prostatic hyperplasia)   . Chronic back pain   . Depression   . Diabetes ([place])   . GERD (gastroesophageal reflux disease)   . Heart attack ([place])   . Hypertension   . Morbid obesity ([place])   . Obstructive sleep apnea   . 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 Status post insertion of spinal cord stimulator   . Stroke ([place])   . UTI (lower urinary tract infection)     Family History: Family History  Problem Relation Age of Onset  . Breast cancer Mother   . Cancer Mother   . Hypertension Mother   . Lung cancer Father   .  Hypertension Father   . Heart disease Father   . Cancer Father   . Kidney disease Sister   . Prostate cancer Neg Hx     Social History   Socioeconomic History  . Marital status: Divorced    Spouse name: Not on file  . Number of children: Not on file  . Years of education: Not on file  . Highest education level: Not on file  Occupational History  . Not on file  Social Needs  . Financial resource strain: Not on file  . Food insecurity:    Worry: Not on file    Inability: Not on file  . Transportation needs:    Medical: Not on file    Non-medical: Not on file  Tobacco Use  . Smoking status: Former Smoker    Types: Cigarettes  . Smokeless tobacco: Never Used  . Tobacco comment: quit 45 years  Substance and Sexual Activity  . Alcohol use: Not Currently    Alcohol/week: 0.0 standard drinks    Comment: have not had alcohol in 21yrs  . Drug use: No  . Sexual activity: Not on file  Lifestyle  . Physical activity:    Days per week: Not on file    Minutes per session: Not on file  . Stress: Not on  file  Relationships  . Social connections:    Talks on phone: Not on file    Gets together: Not on file    Attends religious service: Not on file    Active member of club or organization: Not on file    Attends meetings of clubs or organizations: Not on file    Relationship status: Not on file  . Intimate partner violence:    Fear of current or ex partner: Not on file    Emotionally abused: Not on file    Physically abused: Not on file    Forced sexual activity: Not on file  Other Topics Concern  . Not on file  Social History Narrative  . Not on file      Review of Systems  Constitutional: Negative.  Negative for chills, fatigue and unexpected weight change.  HENT: Negative.  Negative for congestion, rhinorrhea, sneezing and sore throat.   Eyes: Negative for redness.  Respiratory: Negative.  Negative for cough, chest tightness and shortness of breath.   Cardiovascular: Negative.  Negative for chest pain and palpitations.  Gastrointestinal: Negative.  Negative for abdominal pain, constipation, diarrhea, nausea and vomiting.  Endocrine: Negative.   Genitourinary: Negative.  Negative for dysuria and frequency.  Musculoskeletal: Negative.  Negative for arthralgias, back pain, joint swelling and neck pain.  Skin: Negative.  Negative for rash.  Allergic/Immunologic: Negative.   Neurological: Negative.  Negative for tremors and numbness.  Hematological: Negative for adenopathy. Does not bruise/bleed easily.  Psychiatric/Behavioral: Negative.  Negative for behavioral problems, sleep disturbance and suicidal ideas. The patient is not nervous/anxious.     Vital Signs: BP (!) 163/75 (BP Location: Right Arm, Patient Position: Sitting, Cuff Size: Large)   Pulse 63   Resp 16   Ht 5\' 7"  (1.702 m)   Wt 293 lb 9.6 oz (133.2 kg)   SpO2 97%   BMI 45.98 kg/m    Physical Exam  Constitutional: He is oriented to person, place, and time. He appears well-developed and well-nourished. No  distress.  HENT:  Head: Normocephalic and atraumatic.  Mouth/Throat: Oropharynx is clear and moist. No oropharyngeal exudate.  Eyes: Pupils are equal, round, and reactive to  light. EOM are normal.  Neck: Normal range of motion. Neck supple. No JVD present. No tracheal deviation present. No thyromegaly present.  Cardiovascular: Normal rate, regular rhythm and normal heart sounds. Exam reveals no gallop and no friction rub.  No murmur heard. Pulmonary/Chest: Effort normal and breath sounds normal. No respiratory distress. He has no wheezes. He has no rales. He exhibits no tenderness.  Abdominal: Soft. There is no tenderness. There is no guarding.  Musculoskeletal: Normal range of motion.  Lymphadenopathy:    He has no cervical adenopathy.  Neurological: He is alert and oriented to person, place, and time. No cranial nerve deficit.  Skin: Skin is warm and dry. He is not diaphoretic.  Psychiatric: He has a normal mood and affect. His behavior is normal. Judgment and thought content normal.  Nursing note and vitals reviewed.   Assessment/Plan: 1. Diabetic foot infection ([place]) Continue dressing changes for surgical debridement wound.  2. Essential hypertension Blood pressure slightly elevated today.  Patient reports he has been taking his medications.  Encourage patient to continue with medication compliance.  3. Uncontrolled type 2 diabetes mellitus with hyperglycemia (HCC) Stable, continue current medications.  4. Peripheral vascular disease ([place]) Patient will continue see vascular for PVD.  5. Obesity, Class III, BMI 40-49.9 (morbid obesity) (HCC) Obesity Counseling: Risk Assessment: An assessment of behavioral risk factors was made today and includes lack of exercise sedentary lifestyle, lack of portion control and poor dietary habits.  Risk Modification Advice: She was counseled on portion control guidelines. Restricting daily caloric intake to. . The detrimental long term effects  of obesity on her health and ongoing poor compliance was also discussed with the patient.    General Counseling: hazem kenner understanding of the findings of todays visit and agrees with plan of treatment. I have discussed any further diagnostic evaluation that may be needed or ordered today. We also reviewed his medications today. he has been encouraged to call the office with any questions or concerns that should arise related to todays visit.    No orders of the defined types were placed in this encounter.   No orders of the defined types were placed in this encounter.   Time spent: 25 Minutes   This patient was seen by Orson Gear AGNP-C in Collaboration with Dr Lavera Guise as a part of collaborative care agreement     Kendell Bane AGNP-C Internal medicine

## 2018-04-11 NOTE — Patient Instructions (Signed)

## 2018-04-13 DIAGNOSIS — M62838 Other muscle spasm: Secondary | ICD-10-CM | POA: Diagnosis not present

## 2018-04-13 DIAGNOSIS — I10 Essential (primary) hypertension: Secondary | ICD-10-CM | POA: Diagnosis not present

## 2018-04-13 DIAGNOSIS — M549 Dorsalgia, unspecified: Secondary | ICD-10-CM | POA: Diagnosis not present

## 2018-04-13 DIAGNOSIS — L97521 Non-pressure chronic ulcer of other part of left foot limited to breakdown of skin: Secondary | ICD-10-CM | POA: Diagnosis not present

## 2018-04-13 DIAGNOSIS — Z95820 Peripheral vascular angioplasty status with implants and grafts: Secondary | ICD-10-CM | POA: Diagnosis not present

## 2018-04-13 DIAGNOSIS — F341 Dysthymic disorder: Secondary | ICD-10-CM | POA: Diagnosis not present

## 2018-04-13 DIAGNOSIS — E11621 Type 2 diabetes mellitus with foot ulcer: Secondary | ICD-10-CM | POA: Diagnosis not present

## 2018-04-13 DIAGNOSIS — L03116 Cellulitis of left lower limb: Secondary | ICD-10-CM | POA: Diagnosis not present

## 2018-04-13 DIAGNOSIS — E1169 Type 2 diabetes mellitus with other specified complication: Secondary | ICD-10-CM | POA: Diagnosis not present

## 2018-04-16 DIAGNOSIS — L03116 Cellulitis of left lower limb: Secondary | ICD-10-CM | POA: Diagnosis not present

## 2018-04-16 DIAGNOSIS — F341 Dysthymic disorder: Secondary | ICD-10-CM | POA: Diagnosis not present

## 2018-04-16 DIAGNOSIS — M62838 Other muscle spasm: Secondary | ICD-10-CM | POA: Diagnosis not present

## 2018-04-16 DIAGNOSIS — I10 Essential (primary) hypertension: Secondary | ICD-10-CM | POA: Diagnosis not present

## 2018-04-16 DIAGNOSIS — E11621 Type 2 diabetes mellitus with foot ulcer: Secondary | ICD-10-CM | POA: Diagnosis not present

## 2018-04-16 DIAGNOSIS — E1169 Type 2 diabetes mellitus with other specified complication: Secondary | ICD-10-CM | POA: Diagnosis not present

## 2018-04-16 DIAGNOSIS — L97521 Non-pressure chronic ulcer of other part of left foot limited to breakdown of skin: Secondary | ICD-10-CM | POA: Diagnosis not present

## 2018-04-16 DIAGNOSIS — Z95820 Peripheral vascular angioplasty status with implants and grafts: Secondary | ICD-10-CM | POA: Diagnosis not present

## 2018-04-16 DIAGNOSIS — M549 Dorsalgia, unspecified: Secondary | ICD-10-CM | POA: Diagnosis not present

## 2018-04-17 ENCOUNTER — Ambulatory Visit (INDEPENDENT_AMBULATORY_CARE_PROVIDER_SITE_OTHER): Payer: Medicare HMO | Admitting: Vascular Surgery

## 2018-04-17 ENCOUNTER — Encounter (INDEPENDENT_AMBULATORY_CARE_PROVIDER_SITE_OTHER): Payer: Self-pay | Admitting: Vascular Surgery

## 2018-04-17 ENCOUNTER — Encounter (INDEPENDENT_AMBULATORY_CARE_PROVIDER_SITE_OTHER): Payer: Self-pay

## 2018-04-17 ENCOUNTER — Encounter (INDEPENDENT_AMBULATORY_CARE_PROVIDER_SITE_OTHER): Payer: Medicare HMO

## 2018-04-17 DIAGNOSIS — I10 Essential (primary) hypertension: Secondary | ICD-10-CM

## 2018-04-17 DIAGNOSIS — E1165 Type 2 diabetes mellitus with hyperglycemia: Secondary | ICD-10-CM

## 2018-04-17 DIAGNOSIS — R600 Localized edema: Secondary | ICD-10-CM | POA: Diagnosis not present

## 2018-04-17 DIAGNOSIS — E782 Mixed hyperlipidemia: Secondary | ICD-10-CM | POA: Diagnosis not present

## 2018-04-17 DIAGNOSIS — I70245 Atherosclerosis of native arteries of left leg with ulceration of other part of foot: Secondary | ICD-10-CM

## 2018-04-17 DIAGNOSIS — I7025 Atherosclerosis of native arteries of other extremities with ulceration: Secondary | ICD-10-CM

## 2018-04-17 DIAGNOSIS — M7989 Other specified soft tissue disorders: Secondary | ICD-10-CM

## 2018-04-17 NOTE — Progress Notes (Signed)
MRN : [id number]  Victor Wise is a 67 y.o. (12-16-1950) male who presents with chief complaint of  Chief Complaint  Patient presents with  . Follow-up    1 month ABI f/u  .  History of Present Illness: Patient returns today in follow up of PAD and foot ulcerations.  His left foot wounds have really not improved much since I saw him a few weeks ago.  He saw his podiatrist and is scheduled to see them back next week.  Still having swelling in that left leg as well as pain.  No fevers or chills.  Current Outpatient Medications  Medication Sig Dispense Refill  . aspirin EC 81 MG tablet Take 81 mg by mouth daily.    . benazepril (LOTENSIN) 20 MG tablet TAKE 1 TABLET BY MOUTH TWICE DAILY (Patient taking differently: Take 20 mg by mouth 2 (two) times daily. ) 60 tablet 2  . cefdinir (OMNICEF) 300 MG capsule Take 1 capsule (300 mg total) by mouth every 12 (twelve) hours. 20 capsule 0  . ciprofloxacin (CIPRO) 500 MG tablet Take by mouth.    . clindamycin (CLEOCIN) 300 MG capsule Take by mouth.    . clopidogrel (PLAVIX) 75 MG tablet Take 1 tablet (75 mg total) by mouth daily. 30 tablet 11  . DULoxetine (CYMBALTA) 60 MG capsule Take 1 capsule (60 mg total) by mouth daily. 90 capsule 0  . Exenatide ER (BYDUREON) 2 MG PEN Inject 2 mg into the skin once a week. (Patient taking differently: Inject 2 mg into the skin every Wednesday. ) 4 each 5  . fenofibrate 54 MG tablet TAKE 1 TABLET BY MOUTH A DAY AT SUPPERTIME (Patient taking differently: Take 54 mg by mouth daily with supper. ) 30 tablet 5  . fluticasone (FLONASE) 50 MCG/ACT nasal spray Place 2 sprays into both nostrils daily.    . hydrochlorothiazide (HYDRODIURIL) 12.5 MG tablet Take 1 tablet (12.5 mg total) by mouth daily. 30 tablet 5  . insulin aspart (NOVOLOG) 100 UNIT/ML injection Inject 0-5 Units into the skin at bedtime. 10 mL 0  . insulin glargine (LANTUS) 100 UNIT/ML injection Inject 0.6 mLs (60 Units total) into the skin daily. (Patient  taking differently: Inject 60 Units into the skin at bedtime. ) 10 mL 11  . Insulin Pen Needle (B-D UF III MINI PEN NEEDLES) 31G X 5 MM MISC Use as directed with insulin e11.65 100 each 3  . insulin regular (NOVOLIN R) 100 units/mL injection Patient to use Novolin R [place] TiD prior to meals per sliding scale instructions. Max daily dose is 45 units per day. 10 mL 11  . INSULIN SYRINGE 1CC/29G 29G X 1/2" 1 ML MISC Insulin injections QID, use with lantus and regular insulin. DX E11.65 120 each 5  . Insulin Syringe-Needle U-100 (BD INSULIN SYRINGE U/F 1/2UNIT) 31G X 5/16" 0.3 ML MISC Indulin injection QID. Use with lantus and regular insulin Dx. 11.65 400 each 3  . methocarbamol (ROBAXIN) 500 MG tablet Take 1 tablet (500 mg total) by mouth every 8 (eight) hours as needed for muscle spasms. 90 tablet 0  . NARCAN 4 MG/0.1ML LIQD Take 4 mg by mouth as needed (for opioid overdose).     . nitroGLYCERIN (NITROSTAT) 0.4 MG SL tablet Place 1 tablet (0.4 mg total) under the tongue every 5 (five) minutes x 3 doses as needed for chest pain. 25 tablet 1  . omega-3 acid ethyl esters (LOVAZA) 1 g capsule Take 1 capsule (1  g total) by mouth 2 (two) times daily. 60 capsule 5  . Oxcarbazepine (TRILEPTAL) 300 MG tablet Take 300-600 mg by mouth See admin instructions. Take 1 tablet (300MG) by mouth every morning and 2 tablets (600MG) by mouth every night    . oxyCODONE (OXY IR/ROXICODONE) 5 MG immediate release tablet Take 1 tablet (5 mg total) by mouth every 4 (four) hours as needed for moderate pain. 30 tablet 0  . pantoprazole (PROTONIX) 40 MG tablet TAKE 1 TABLET BY MOUTH A DAY FOR REFLUX (Patient taking differently: Take 40 mg by mouth daily. ) 30 tablet 3  . polyethylene glycol (MIRALAX / GLYCOLAX) packet Take 17 g by mouth daily as needed.    . pregabalin (LYRICA) 200 MG capsule Take 200 mg by mouth See admin instructions. Take 2 capsules (400MG) by mouth every morning and 1 capsule (200MG) by mouth every night    .  senna (SENOKOT) 8.6 MG TABS tablet Take 1 tablet (8.6 mg total) by mouth 2 (two) times daily. 120 each 0  . tamsulosin (FLOMAX) 0.4 MG CAPS capsule Take 1 capsule (0.4 mg total) by mouth daily after supper. 30 capsule 3   No current facility-administered medications for this visit.     Past Medical History:  Diagnosis Date  . Allergy   . Basal cell carcinoma    forehead  . BPH (benign prostatic hyperplasia)   . Chronic back pain   . Depression   . Diabetes ([place])   . GERD (gastroesophageal reflux disease)   . Heart attack ([place])   . Hypertension   . Morbid obesity ([place])   . Obstructive sleep apnea   . 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 Status post insertion of spinal cord stimulator   . Stroke ([place])   . UTI (lower urinary tract infection)     Past Surgical History:  Procedure Laterality Date  . CARDIAC CATHETERIZATION N/A 12/19/2014   Procedure: Coronary Stent Intervention;  Surgeon: Charolette Forward, MD;  Location: [place] [facility];  Service: Cardiovascular;  Laterality: N/A;  . CARDIAC CATHETERIZATION Left 12/19/2014   Procedure: Left Heart Cath and Coronary Angiography;  Surgeon: Dionisio Whitt, MD;  Location: [place] [facility];  Service: Cardiovascular;  Laterality: Left;  . IRRIGATION AND DEBRIDEMENT FOOT Left 03/02/2018   Procedure: IRRIGATION AND DEBRIDEMENT FOOT;  Surgeon: Samara Deist, DPM;  Location: [facility];  Service: Podiatry;  Laterality: Left;  . IRRIGATION AND DEBRIDEMENT FOOT Left 03/08/2018   Procedure: IRRIGATION AND DEBRIDEMENT FOOT AND BONE;  Surgeon: Sharlotte Alamo, DPM;  Location: [facility];  Service: Podiatry;  Laterality: Left;  . LOWER EXTREMITY ANGIOGRAPHY Left 03/05/2018   Procedure: Lower Extremity Angiography;  Surgeon: Algernon Huxley, MD;  Location: [place] [facility];  Service: Cardiovascular;  Laterality: Left;  . LOWER EXTREMITY ANGIOGRAPHY Left 03/08/2018   Procedure: LOWER EXTREMITY ANGIOGRAPHY;  Surgeon: Algernon Huxley, MD;  Location: [place] [facility];  Service: Cardiovascular;  Laterality: Left;  . PACEMAKER INSERTION Left 11/02/2015   Procedure: INSERTION PACEMAKER;  Surgeon: Isaias Cowman, MD;  Location: [facility];  Service: Cardiovascular;  Laterality: Left;  . Pain Stimulator    . Right toe amputation     Social History        Tobacco Use  . Smoking status: Former Smoker    Types: Cigarettes  . Smokeless tobacco: Never Used  . Tobacco comment: quit 45 years  Substance Use Topics  . Alcohol use: Yes    Alcohol/week: 0.0  standard drinks    Comment: occasionally  . Drug use: No     Family History      Family History  Problem Relation Age of Onset  . Breast cancer Mother   . Cancer Mother   . Hypertension Mother   . Lung cancer Father   . Hypertension Father   . Heart disease Father   . Cancer Father   . Kidney disease Sister   . Prostate cancer Neg Hx          Allergies  Allergen Reactions  . Amoxicillin Other (See Comments)    Has never taken amoxicillin -ENT told him not to take (? Had skin test) Has patient had a PCN reaction causing immediate rash, facial/tongue/throat swelling, SOB or lightheadedness with hypotension: No Has patient had a PCN reaction causing severe rash involving mucus membranes or skin necrosis: No Has patient had a PCN reaction that required hospitalization No Has patient had a PCN reaction occurring within the last 10 years: No If above answers are "NO", then may proceed w/ Cephalo  . Other Anaphylaxis, Itching and Other (See Comments)    Pt states that he is allergic to Endopa.  Reaction:  Anaphylaxis  Pt states that he is allergic to Metabisulfites Reaction:  Itching   . Penicillins Other (See Comments)    Arm turned "blue" at injection site (no treatment needed) Has patient had a PCN reaction causing immediate rash, facial/tongue/throat swelling, SOB or lightheadedness with hypotension: No Has patient had a PCN reaction causing severe rash involving  mucus membranes or skin necrosis: No Has patient had a PCN reaction that required hospitalization No Has patient had a PCN reaction occurring within the last 10 years: No If all of the above answers are "NO", then may proceed with Cephalosporin use.  [name] Hydralazine Other (See Comments)    Reaction:  Cramping of extremities  . Crestor [Rosuvastatin Calcium] Other (See Comments)    Reaction:  Pt is unable to move arms/legs   . Metoprolol Nausea And Vomiting  . Red Dye Itching  . Statins Other (See Comments)    Reaction:  Pt is unable to move arms/legs  . Sulfa Antibiotics Itching    Other reaction(s): Unknown  . Yellow Dyes (Non-Tartrazine) Itching  . Aspirin Itching and Other (See Comments)    Pt states that he is able to use in lower doses.    . Tape Rash    Please use "paper" tape only. Please use "paper" tape only.     REVIEW OF SYSTEMS (Negative unless checked)  Constitutional: [] Weight loss  [] Fever  [] Chills Cardiac: [] Chest pain   [] Chest pressure   [] Palpitations   [] Shortness of breath when laying flat   [] Shortness of breath at rest   [] Shortness of breath with exertion. Vascular:  [] Pain in legs with walking   [] Pain in legs at rest   [] Pain in legs when laying flat   [] Claudication   [] Pain in feet when walking  [x] Pain in feet at rest  [] Pain in feet when laying flat   [] History of DVT   [] Phlebitis   [x] Swelling in legs   [] Varicose veins   [x] Non-healing ulcers Pulmonary:   [] Uses home oxygen   [] Productive cough   [] Hemoptysis   [] Wheeze  [] COPD   [] Asthma Neurologic:  [] Dizziness  [] Blackouts   [] Seizures   [] History of stroke   [] History of TIA  [] Aphasia   [] Temporary blindness   [] Dysphagia   [] Weakness or numbness in arms   []   Weakness or numbness in legs Musculoskeletal:  [x] Arthritis   [] Joint swelling   [x] Joint pain   [] Low back pain Hematologic:  [] Easy bruising  [] Easy bleeding   [] Hypercoagulable state   [] Anemic   Gastrointestinal:  [] Blood in  stool   [] Vomiting blood  [] Gastroesophageal reflux/heartburn   [] Abdominal pain Genitourinary:  [x] Chronic kidney disease   [] Difficult urination  [] Frequent urination  [] Burning with urination   [] Hematuria Skin:  [] Rashes   [x] Ulcers   [x] Wounds Psychological:  [] History of anxiety   []  History of major depression.    Physical Examination  There were no vitals taken for this visit. Gen:  WD/WN, NAD Head: [place]/AT, No temporalis wasting. Ear/Nose/Throat: Hearing grossly intact, nares w/o erythema or drainage Eyes: Conjunctiva clear. Sclera non-icteric Neck: Supple.  Trachea midline Pulmonary:  Good air movement, no use of accessory muscles.  Cardiac: RRR, no JVD Vascular:  Vessel Right Left  Radial Palpable Palpable                          PT Not Palpable 1+ Palpable  DP Palpable Trace Palpable    Musculoskeletal: M/S 5/5 throughout.  No deformity or atrophy. 1-2+ LLE edema. Trace RLE edema Neurologic: Sensation grossly intact in extremities.  Symmetrical.  Speech is fluent.  Psychiatric: Judgment intact, Mood & affect appropriate for pt's clinical situation. Dermatologic: Aspect of the left foot just beyond the metatarsal head continues to be an open wound which is packed today.  No significant surrounding erythema.  There are some dry scabs on both feet on multiple toes.       Labs Recent Results (from the past 2160 hour(s))  Basic metabolic panel     Status: Abnormal   Collection Time: 03/01/18  3:14 PM  Result Value Ref Range   Sodium 140 135 - 145 mmol/L   Potassium 4.3 3.5 - 5.1 mmol/L   Chloride 102 98 - 111 mmol/L   CO2 28 22 - 32 mmol/L   Glucose, Bld 127 (H) 70 - 99 mg/dL   BUN 16 8 - 23 mg/dL   Creatinine, Ser 0.91 0.61 - 1.24 mg/dL   Calcium 9.0 8.9 - 10.3 mg/dL   GFR calc non Af Amer >60 >60 mL/min   GFR calc Af Amer >60 >60 mL/min    Comment: (NOTE) The eGFR has been calculated using the CKD EPI equation. This calculation has not been validated  in all clinical situations. eGFR's persistently <60 mL/min signify possible Chronic Kidney Disease.    Anion gap 10 5 - 15    Comment: Performed at [facility], [address] [place], [place] [address]  CBC     Status: Abnormal   Collection Time: 03/01/18  3:14 PM  Result Value Ref Range   WBC 9.1 4.0 - 10.5 K/uL   RBC 4.83 4.22 - 5.81 MIL/uL   Hemoglobin 12.3 (L) 13.0 - 17.0 g/dL   HCT 39.1 39.0 - 52.0 %   MCV 81.0 80.0 - 100.0 fL   MCH 25.5 (L) 26.0 - 34.0 pg   MCHC 31.5 30.0 - 36.0 g/dL   RDW 16.2 (H) 11.5 - 15.5 %   Platelets 276 150 - 400 K/uL   nRBC 0.0 0.0 - 0.2 %    Comment: Performed at [facility], [address] [place], [place] [address]  Blood culture (routine x 2)     Status: None   Collection Time: 03/01/18  5:27 PM  Result  Value Ref Range   Specimen Description BLOOD LEFT ANTECUBITAL    Special Requests      BOTTLES DRAWN AEROBIC AND ANAEROBIC Blood Culture adequate volume   Culture      NO GROWTH 5 DAYS Performed at [facility], [address] [place], [place] [address]    Report Status 03/06/2018 FINAL   Blood culture (routine x 2)     Status: None   Collection Time: 03/01/18  5:27 PM  Result Value Ref Range   Specimen Description BLOOD RIGHT ANTECUBITAL    Special Requests      BOTTLES DRAWN AEROBIC AND ANAEROBIC Blood Culture results may not be optimal due to an excessive volume of blood received in culture bottles   Culture      NO GROWTH 5 DAYS Performed at [facility], [address] [place], [place] [address]    Report Status 03/06/2018 FINAL   Glucose, capillary     Status: Abnormal   Collection Time: 03/02/18 12:21 AM  Result Value Ref Range   Glucose-Capillary 163 (H) 70 - 99 mg/dL  HIV antibody (Routine Testing)     Status: None   Collection Time: 03/02/18  4:35 AM  Result Value Ref Range   HIV Screen 4th Generation wRfx Non Reactive Non Reactive    Comment: (NOTE) Performed At: [facility] [place], [place] [id number] Rush Farmer MD [id number]   Basic metabolic panel     Status: Abnormal   Collection Time: 03/02/18  4:35 AM  Result Value Ref Range   Sodium 139 135 - 145 mmol/L   Potassium 3.7 3.5 - 5.1 mmol/L   Chloride 102 98 - 111 mmol/L   CO2 28 22 - 32 mmol/L   Glucose, Bld 158 (H) 70 - 99 mg/dL   BUN 14 8 - 23 mg/dL   Creatinine, Ser 0.86 0.61 - 1.24 mg/dL   Calcium 8.5 (L) 8.9 - 10.3 mg/dL   GFR calc non Af Amer >60 >60 mL/min   GFR calc Af Amer >60 >60 mL/min    Comment: (NOTE) The eGFR has been calculated using the CKD EPI equation. This calculation has not been validated in all clinical situations. eGFR's persistently <60 mL/min signify possible Chronic Kidney Disease.    Anion gap 9 5 - 15    Comment: Performed at [facility], [address] [place], [place] [address]  CBC     Status: Abnormal   Collection Time: 03/02/18  4:35 AM  Result Value Ref Range   WBC 7.2 4.0 - 10.5 K/uL   RBC 4.23 4.22 - 5.81 MIL/uL   Hemoglobin 10.7 (L) 13.0 - 17.0 g/dL   HCT 33.8 (L) 39.0 - 52.0 %   MCV 79.9 (L) 80.0 - 100.0 fL   MCH 25.3 (L) 26.0 - 34.0 pg   MCHC 31.7 30.0 - 36.0 g/dL   RDW 16.2 (H) 11.5 - 15.5 %   Platelets 262 150 - 400 K/uL   nRBC 0.0 0.0 - 0.2 %    Comment: Performed at [facility], [address] [place], [place] [address]  Glucose, capillary     Status: Abnormal   Collection Time: 03/02/18  5:42 AM  Result Value Ref Range   Glucose-Capillary 137 (H) 70 - 99 mg/dL  Glucose, capillary     Status: Abnormal   Collection Time: 03/02/18 10:34 AM  Result Value Ref Range   Glucose-Capillary 165 (H) 70 - 99 mg/dL  Aerobic/Anaerobic Culture (  surgical/deep wound)     Status: None   Collection Time: 03/02/18 11:26 AM  Result Value Ref Range   Specimen Description      FOOT Performed at [facility], [address] [place], [place] [address]    Special Requests      LEFT Performed at  [facility], [place], [place] [address]    Gram Stain      RARE WBC PRESENT, PREDOMINANTLY PMN ABUNDANT GRAM POSITIVE COCCI FEW GRAM NEGATIVE RODS RARE GRAM POSITIVE RODS    Culture      FEW ESCHERICHIA COLI FEW ENTEROCOCCUS FAECALIS MODERATE BACTEROIDES VULGATUS BETA LACTAMASE POSITIVE Performed at [facility], [place] [address] [place], [place] [address]    Report Status 03/07/2018 FINAL    Organism ID, Bacteria ESCHERICHIA COLI    Organism ID, Bacteria ENTEROCOCCUS FAECALIS       Susceptibility   Escherichia coli - MIC*    AMPICILLIN <=2 SENSITIVE Sensitive     CEFAZOLIN <=4 SENSITIVE Sensitive     CEFEPIME <=1 SENSITIVE Sensitive     CEFTAZIDIME <=1 SENSITIVE Sensitive     CEFTRIAXONE <=1 SENSITIVE Sensitive     CIPROFLOXACIN >=4 RESISTANT Resistant     GENTAMICIN <=1 SENSITIVE Sensitive     IMIPENEM <=0.25 SENSITIVE Sensitive     TRIMETH/SULFA <=20 SENSITIVE Sensitive     AMPICILLIN/SULBACTAM <=2 SENSITIVE Sensitive     PIP/TAZO <=4 SENSITIVE Sensitive     Extended ESBL NEGATIVE Sensitive     * FEW ESCHERICHIA COLI   Enterococcus faecalis - MIC*    AMPICILLIN <=2 SENSITIVE Sensitive     VANCOMYCIN 2 SENSITIVE Sensitive     GENTAMICIN SYNERGY RESISTANT Resistant     * FEW ENTEROCOCCUS FAECALIS  Surgical pathology     Status: None   Collection Time: 03/02/18 11:32 AM  Result Value Ref Range   SURGICAL PATHOLOGY      Surgical Pathology CASE: [id number] PATIENT: Rachel Bear Surgical Pathology Report     SPECIMEN SUBMITTED: A. Sesamoid, left foot, infected  CLINICAL HISTORY: None provided  PRE-OPERATIVE DIAGNOSIS: Left foot infection  POST-OPERATIVE DIAGNOSIS: Same as pre-op     DIAGNOSIS: A. SESAMOID, LEFT FOOT; INCISION AND DRAINAGE: - OSTEOMYELITIS. - OSTEOMYELITIS INVOLVES UNORIENTED INKED CUT SURFACE OF BONE.   GROSS DESCRIPTION: A. Labeled: Left foot infected sesamoid Received: In formalin Size: 1.6 x  1.1 x 0.6 cm Description: Pink-tan fragment of bone, surface opposite smooth articular surface marked green, specimen sectioned  Block summary: 1 - entirely submitted  Tissue decalcification: Yes   Final Diagnosis performed by Quay Burow, MD.   Electronically signed 03/06/2018 11:52:07AM The electronic signature indicates that the named Attending Pathologist has evaluated the specimen  Technical component performed at [facility], [address] [place], [place] [address] Lab: [phone] Dir: Rush Farmer, MD, MMM  Professional component performed at [facility], [place], [place], [place] [address] Lab: [phone] Dir: Dellia Nims. Rubinas, MD   Glucose, capillary     Status: Abnormal   Collection Time: 03/02/18 12:10 PM  Result Value Ref Range   Glucose-Capillary 195 (H) 70 - 99 mg/dL  Hemoglobin A1c     Status: Abnormal   Collection Time: 03/02/18  3:38 PM  Result Value Ref Range   Hgb A1c MFr Bld 8.0 (H) 4.8 - 5.6 %    Comment: (NOTE) Pre diabetes:          5.7%-6.4% Diabetes:              >  6.4% Glycemic control for   <7.0% adults with diabetes    Mean Plasma Glucose 182.9 mg/dL    Comment: Performed at [facility] [address] [place], [place] [address]  Glucose, capillary     Status: Abnormal   Collection Time: 03/02/18  6:12 PM  Result Value Ref Range   Glucose-Capillary 211 (H) 70 - 99 mg/dL   Comment 1 Notify RN   Glucose, capillary     Status: Abnormal   Collection Time: 03/02/18  7:58 PM  Result Value Ref Range   Glucose-Capillary 275 (H) 70 - 99 mg/dL   Comment 1 Notify RN   Glucose, capillary     Status: Abnormal   Collection Time: 03/02/18 11:49 PM  Result Value Ref Range   Glucose-Capillary 204 (H) 70 - 99 mg/dL   Comment 1 Notify RN   Glucose, capillary     Status: Abnormal   Collection Time: 03/03/18  6:10 AM  Result Value Ref Range   Glucose-Capillary 188 (H) 70 - 99 mg/dL   Comment 1 Notify RN     Basic metabolic panel     Status: Abnormal   Collection Time: 03/03/18  7:26 AM  Result Value Ref Range   Sodium 139 135 - 145 mmol/L   Potassium 4.1 3.5 - 5.1 mmol/L   Chloride 102 98 - 111 mmol/L   CO2 29 22 - 32 mmol/L   Glucose, Bld 200 (H) 70 - 99 mg/dL   BUN 14 8 - 23 mg/dL   Creatinine, Ser 1.05 0.61 - 1.24 mg/dL   Calcium 8.3 (L) 8.9 - 10.3 mg/dL   GFR calc non Af Amer >60 >60 mL/min   GFR calc Af Amer >60 >60 mL/min    Comment: (NOTE) The eGFR has been calculated using the CKD EPI equation. This calculation has not been validated in all clinical situations. eGFR's persistently <60 mL/min signify possible Chronic Kidney Disease.    Anion gap 8 5 - 15    Comment: Performed at [facility], [address] [place], [place] [address]  CBC     Status: Abnormal   Collection Time: 03/03/18  7:26 AM  Result Value Ref Range   WBC 6.4 4.0 - 10.5 K/uL   RBC 4.18 (L) 4.22 - 5.81 MIL/uL   Hemoglobin 10.5 (L) 13.0 - 17.0 g/dL   HCT 33.7 (L) 39.0 - 52.0 %   MCV 80.6 80.0 - 100.0 fL   MCH 25.1 (L) 26.0 - 34.0 pg   MCHC 31.2 30.0 - 36.0 g/dL   RDW 16.0 (H) 11.5 - 15.5 %   Platelets 225 150 - 400 K/uL   nRBC 0.0 0.0 - 0.2 %    Comment: Performed at [facility], [address] [place], [place] [address]  Vancomycin, trough     Status: None   Collection Time: 03/03/18  7:26 AM  Result Value Ref Range   Vancomycin Tr 17 15 - 20 ug/mL    Comment: Performed at [facility], [address] [place], [place] [address]  Glucose, capillary     Status: Abnormal   Collection Time: 03/03/18 12:02 PM  Result Value Ref Range   Glucose-Capillary 211 (H) 70 - 99 mg/dL  Glucose, capillary     Status: Abnormal   Collection Time: 03/03/18  4:43 PM  Result Value Ref Range   Glucose-Capillary 212 (H) 70 - 99 mg/dL   Comment 1 Notify RN   Glucose, capillary  Status: Abnormal   Collection Time: 03/03/18  9:12 PM  Result Value Ref Range   Glucose-Capillary 204  (H) 70 - 99 mg/dL   Comment 1 Notify RN   Glucose, capillary     Status: Abnormal   Collection Time: 03/03/18 11:42 PM  Result Value Ref Range   Glucose-Capillary 213 (H) 70 - 99 mg/dL   Comment 1 Notify RN   Glucose, capillary     Status: Abnormal   Collection Time: 03/04/18  6:55 AM  Result Value Ref Range   Glucose-Capillary 230 (H) 70 - 99 mg/dL   Comment 1 Notify RN   Glucose, capillary     Status: Abnormal   Collection Time: 03/04/18 11:45 AM  Result Value Ref Range   Glucose-Capillary 212 (H) 70 - 99 mg/dL  Glucose, capillary     Status: Abnormal   Collection Time: 03/04/18  4:50 PM  Result Value Ref Range   Glucose-Capillary 186 (H) 70 - 99 mg/dL   Comment 1 Notify RN   Glucose, capillary     Status: Abnormal   Collection Time: 03/04/18  9:09 PM  Result Value Ref Range   Glucose-Capillary 147 (H) 70 - 99 mg/dL   Comment 1 Notify RN   Glucose, capillary     Status: Abnormal   Collection Time: 03/05/18  7:56 AM  Result Value Ref Range   Glucose-Capillary 188 (H) 70 - 99 mg/dL   Comment 1 Notify RN   Glucose, capillary     Status: Abnormal   Collection Time: 03/05/18 11:40 AM  Result Value Ref Range   Glucose-Capillary 172 (H) 70 - 99 mg/dL   Comment 1 Notify RN   Glucose, capillary     Status: Abnormal   Collection Time: 03/05/18  2:04 PM  Result Value Ref Range   Glucose-Capillary 174 (H) 70 - 99 mg/dL  Glucose, capillary     Status: Abnormal   Collection Time: 03/05/18  4:13 PM  Result Value Ref Range   Glucose-Capillary 159 (H) 70 - 99 mg/dL  Glucose, capillary     Status: Abnormal   Collection Time: 03/05/18  9:26 PM  Result Value Ref Range   Glucose-Capillary 126 (H) 70 - 99 mg/dL   Comment 1 Notify RN   Basic metabolic panel     Status: Abnormal   Collection Time: 03/06/18  4:11 AM  Result Value Ref Range   Sodium 141 135 - 145 mmol/L   Potassium 3.8 3.5 - 5.1 mmol/L   Chloride 106 98 - 111 mmol/L   CO2 30 22 - 32 mmol/L   Glucose, Bld 206 (H) 70 -  99 mg/dL   BUN 19 8 - 23 mg/dL   Creatinine, Ser 0.86 0.61 - 1.24 mg/dL   Calcium 8.4 (L) 8.9 - 10.3 mg/dL   GFR calc non Af Amer >60 >60 mL/min   GFR calc Af Amer >60 >60 mL/min    Comment: (NOTE) The eGFR has been calculated using the CKD EPI equation. This calculation has not been validated in all clinical situations. eGFR's persistently <60 mL/min signify possible Chronic Kidney Disease.    Anion gap 5 5 - 15    Comment: Performed at [facility], [address] [place], [place] [address]  Glucose, capillary     Status: Abnormal   Collection Time: 03/06/18  7:57 AM  Result Value Ref Range   Glucose-Capillary 173 (H) 70 - 99 mg/dL   Comment 1 Notify RN   Glucose, capillary  Status: Abnormal   Collection Time: 03/06/18 11:39 AM  Result Value Ref Range   Glucose-Capillary 170 (H) 70 - 99 mg/dL   Comment 1 Notify RN   Glucose, capillary     Status: Abnormal   Collection Time: 03/06/18  4:48 PM  Result Value Ref Range   Glucose-Capillary 175 (H) 70 - 99 mg/dL   Comment 1 Notify RN   Glucose, capillary     Status: Abnormal   Collection Time: 03/06/18  9:16 PM  Result Value Ref Range   Glucose-Capillary 180 (H) 70 - 99 mg/dL  Vancomycin, trough     Status: None   Collection Time: 03/07/18  4:19 AM  Result Value Ref Range   Vancomycin Tr 17 15 - 20 ug/mL    Comment: Performed at [facility], [address] [place], [place] [address]  Basic metabolic panel     Status: Abnormal   Collection Time: 03/07/18  4:19 AM  Result Value Ref Range   Sodium 141 135 - 145 mmol/L   Potassium 3.6 3.5 - 5.1 mmol/L   Chloride 106 98 - 111 mmol/L   CO2 27 22 - 32 mmol/L   Glucose, Bld 244 (H) 70 - 99 mg/dL   BUN 18 8 - 23 mg/dL   Creatinine, Ser 0.88 0.61 - 1.24 mg/dL   Calcium 8.5 (L) 8.9 - 10.3 mg/dL   GFR calc non Af Amer >60 >60 mL/min   GFR calc Af Amer >60 >60 mL/min    Comment: (NOTE) The eGFR has been calculated using the CKD EPI equation. This  calculation has not been validated in all clinical situations. eGFR's persistently <60 mL/min signify possible Chronic Kidney Disease.    Anion gap 8 5 - 15    Comment: Performed at [facility], [address] [place], [place] [address]  Glucose, capillary     Status: Abnormal   Collection Time: 03/07/18  7:25 AM  Result Value Ref Range   Glucose-Capillary 175 (H) 70 - 99 mg/dL  Glucose, capillary     Status: Abnormal   Collection Time: 03/07/18 11:50 AM  Result Value Ref Range   Glucose-Capillary 155 (H) 70 - 99 mg/dL  Glucose, capillary     Status: Abnormal   Collection Time: 03/07/18  4:27 PM  Result Value Ref Range   Glucose-Capillary 174 (H) 70 - 99 mg/dL  Glucose, capillary     Status: Abnormal   Collection Time: 03/07/18  9:07 PM  Result Value Ref Range   Glucose-Capillary 152 (H) 70 - 99 mg/dL  CBC     Status: Abnormal   Collection Time: 03/08/18  4:34 AM  Result Value Ref Range   WBC 5.1 4.0 - 10.5 K/uL   RBC 3.73 (L) 4.22 - 5.81 MIL/uL   Hemoglobin 9.4 (L) 13.0 - 17.0 g/dL   HCT 30.1 (L) 39.0 - 52.0 %   MCV 80.7 80.0 - 100.0 fL   MCH 25.2 (L) 26.0 - 34.0 pg   MCHC 31.2 30.0 - 36.0 g/dL   RDW 16.0 (H) 11.5 - 15.5 %   Platelets 277 150 - 400 K/uL   nRBC 0.0 0.0 - 0.2 %    Comment: Performed at [facility], [address], [place], [place] [address]  Basic metabolic panel     Status: Abnormal   Collection Time: 03/08/18  4:34 AM  Result Value Ref Range   Sodium 141 135 - 145 mmol/L   Potassium 3.9 3.5 - 5.1 mmol/L  Chloride 106 98 - 111 mmol/L   CO2 30 22 - 32 mmol/L   Glucose, Bld 189 (H) 70 - 99 mg/dL   BUN 17 8 - 23 mg/dL   Creatinine, Ser 0.86 0.61 - 1.24 mg/dL   Calcium 8.5 (L) 8.9 - 10.3 mg/dL   GFR calc non Af Amer >60 >60 mL/min   GFR calc Af Amer >60 >60 mL/min    Comment: (NOTE) The eGFR has been calculated using the CKD EPI equation. This calculation has not been validated in all clinical situations. eGFR's persistently <60  mL/min signify possible Chronic Kidney Disease.    Anion gap 5 5 - 15    Comment: Performed at [facility], [address] [place], [place] [address]  Magnesium     Status: None   Collection Time: 03/08/18  4:34 AM  Result Value Ref Range   Magnesium 2.0 1.7 - 2.4 mg/dL    Comment: Performed at [facility], [address] [place], [place] [address]  Vancomycin, trough     Status: None   Collection Time: 03/08/18  7:38 AM  Result Value Ref Range   Vancomycin Tr 20 15 - 20 ug/mL    Comment: Performed at [facility], [address] [place], [place] [address]  Glucose, capillary     Status: Abnormal   Collection Time: 03/08/18  7:52 AM  Result Value Ref Range   Glucose-Capillary 167 (H) 70 - 99 mg/dL  Glucose, capillary     Status: Abnormal   Collection Time: 03/08/18  9:32 AM  Result Value Ref Range   Glucose-Capillary 166 (H) 70 - 99 mg/dL  Glucose, capillary     Status: Abnormal   Collection Time: 03/08/18 12:31 PM  Result Value Ref Range   Glucose-Capillary 164 (H) 70 - 99 mg/dL  Glucose, capillary     Status: Abnormal   Collection Time: 03/08/18  4:55 PM  Result Value Ref Range   Glucose-Capillary 157 (H) 70 - 99 mg/dL  Surgical pathology     Status: None   Collection Time: 03/08/18  6:14 PM  Result Value Ref Range   SURGICAL PATHOLOGY      Surgical Pathology CASE: [id number] PATIENT: Kentarius Marzella Surgical Pathology Report     SPECIMEN SUBMITTED: A. Toe joint, left great  CLINICAL HISTORY: None provided  PRE-OPERATIVE DIAGNOSIS: Exposed bone and ulceration on left foot  POST-OPERATIVE DIAGNOSIS: Same as pre-op     DIAGNOSIS:  A.  TOE JOINT, LEFT GREAT; EXCISION AND DEBRIDEMENT: - NECROTIC SOFT TISSUE WITH ULCERATION. - DEGENERATED BONE AND ARTICULAR CARTILAGE WITH FOCAL ACUTE OSTEOMYELITIS. - NEGATIVE FOR MALIGNANCY.  GROSS DESCRIPTION: A. Labeled: Left great toe joint Received: In formalin Tissue fragment(s):  2 Size: 2.4 x 2.2 x 1.3 cm and 1.9 x 1.6 x 0.9 cm Description: Both fragments are tan-brown bony, one with a convex articular surface and one with a concave articular surface, the surface opposite the articular surface of the fragments is a smooth cut surface, smooth cut surfaces are differentially inked and sectioned Representative submitted in two cassettes.    Final [employer] performed by Moises Blood, MD.   Electronically signed 03/12/2018 12:53:58PM The electronic signature indicates that the named Attending Pathologist has evaluated the specimen  Technical component performed at [facility], [address], [place], [place] [address] Lab: [phone] Dir: Rush Farmer, MD, MMM  Professional component performed at [facility], [facility], [address], [place], [place] [address] Lab: [phone] Dir: Dellia Nims. Rubinas,  MD   Glucose, capillary     Status: Abnormal   Collection Time: 03/08/18  6:58 PM  Result Value Ref Range   Glucose-Capillary 151 (H) 70 - 99 mg/dL  Glucose, capillary     Status: Abnormal   Collection Time: 03/08/18  9:20 PM  Result Value Ref Range   Glucose-Capillary 145 (H) 70 - 99 mg/dL   Comment 1 Notify RN   CBC     Status: Abnormal   Collection Time: 03/09/18  4:41 AM  Result Value Ref Range   WBC 5.2 4.0 - 10.5 K/uL   RBC 3.49 (L) 4.22 - 5.81 MIL/uL   Hemoglobin 8.7 (L) 13.0 - 17.0 g/dL   HCT 28.4 (L) 39.0 - 52.0 %   MCV 81.4 80.0 - 100.0 fL   MCH 24.9 (L) 26.0 - 34.0 pg   MCHC 30.6 30.0 - 36.0 g/dL   RDW 16.2 (H) 11.5 - 15.5 %   Platelets 269 150 - 400 K/uL   nRBC 0.0 0.0 - 0.2 %    Comment: Performed at [facility], [address] [place], [place] [address]  Basic metabolic panel     Status: Abnormal   Collection Time: 03/09/18  4:41 AM  Result Value Ref Range   Sodium 143 135 - 145 mmol/L   Potassium 3.6 3.5 - 5.1 mmol/L   Chloride 108 98 - 111 mmol/L   CO2 27 22 - 32 mmol/L   Glucose, Bld 155 (H) 70 - 99  mg/dL   BUN 17 8 - 23 mg/dL   Creatinine, Ser 0.77 0.61 - 1.24 mg/dL   Calcium 8.0 (L) 8.9 - 10.3 mg/dL   GFR calc non Af Amer >60 >60 mL/min   GFR calc Af Amer >60 >60 mL/min    Comment: (NOTE) The eGFR has been calculated using the CKD EPI equation. This calculation has not been validated in all clinical situations. eGFR's persistently <60 mL/min signify possible Chronic Kidney Disease.    Anion gap 8 5 - 15    Comment: Performed at [facility], [address] [place], [place] [address]  Glucose, capillary     Status: Abnormal   Collection Time: 03/09/18  8:25 AM  Result Value Ref Range   Glucose-Capillary 139 (H) 70 - 99 mg/dL  Glucose, capillary     Status: Abnormal   Collection Time: 03/09/18 12:02 PM  Result Value Ref Range   Glucose-Capillary 197 (H) 70 - 99 mg/dL  Glucose, capillary     Status: Abnormal   Collection Time: 03/09/18  4:50 PM  Result Value Ref Range   Glucose-Capillary 194 (H) 70 - 99 mg/dL  Glucose, capillary     Status: Abnormal   Collection Time: 03/09/18  9:03 PM  Result Value Ref Range   Glucose-Capillary 171 (H) 70 - 99 mg/dL  CBC     Status: Abnormal   Collection Time: 03/10/18  4:29 AM  Result Value Ref Range   WBC 5.2 4.0 - 10.5 K/uL   RBC 3.57 (L) 4.22 - 5.81 MIL/uL   Hemoglobin 8.9 (L) 13.0 - 17.0 g/dL   HCT 28.7 (L) 39.0 - 52.0 %   MCV 80.4 80.0 - 100.0 fL   MCH 24.9 (L) 26.0 - 34.0 pg   MCHC 31.0 30.0 - 36.0 g/dL   RDW 16.0 (H) 11.5 - 15.5 %   Platelets 261 150 - 400 K/uL   nRBC 0.0 0.0 - 0.2 %    Comment: Performed at [facility]  Lab, [address] [place], [place] [address]  Basic metabolic panel     Status: Abnormal   Collection Time: 03/10/18  4:29 AM  Result Value Ref Range   Sodium 142 135 - 145 mmol/L   Potassium 3.3 (L) 3.5 - 5.1 mmol/L   Chloride 107 98 - 111 mmol/L   CO2 29 22 - 32 mmol/L   Glucose, Bld 203 (H) 70 - 99 mg/dL   BUN 16 8 - 23 mg/dL   Creatinine, Ser 0.87 0.61 - 1.24 mg/dL   Calcium  8.2 (L) 8.9 - 10.3 mg/dL   GFR calc non Af Amer >60 >60 mL/min   GFR calc Af Amer >60 >60 mL/min    Comment: (NOTE) The eGFR has been calculated using the CKD EPI equation. This calculation has not been validated in all clinical situations. eGFR's persistently <60 mL/min signify possible Chronic Kidney Disease.    Anion gap 6 5 - 15    Comment: Performed at [facility], [address] [place], [place] [address]  Glucose, capillary     Status: Abnormal   Collection Time: 03/10/18  8:24 AM  Result Value Ref Range   Glucose-Capillary 167 (H) 70 - 99 mg/dL   Comment 1 Notify RN   Glucose, capillary     Status: Abnormal   Collection Time: 03/10/18 11:52 AM  Result Value Ref Range   Glucose-Capillary 171 (H) 70 - 99 mg/dL   Comment 1 Notify RN   Glucose, capillary     Status: Abnormal   Collection Time: 03/10/18  4:57 PM  Result Value Ref Range   Glucose-Capillary 219 (H) 70 - 99 mg/dL   Comment 1 Notify RN   Glucose, capillary     Status: Abnormal   Collection Time: 03/10/18  9:04 PM  Result Value Ref Range   Glucose-Capillary 226 (H) 70 - 99 mg/dL   Comment 1 Notify RN   Glucose, capillary     Status: Abnormal   Collection Time: 03/11/18  7:57 AM  Result Value Ref Range   Glucose-Capillary 169 (H) 70 - 99 mg/dL   Comment 1 Notify RN   Glucose, capillary     Status: Abnormal   Collection Time: 03/11/18 11:56 AM  Result Value Ref Range   Glucose-Capillary 195 (H) 70 - 99 mg/dL   Comment 1 Notify RN   Glucose, capillary     Status: Abnormal   Collection Time: 03/11/18  5:14 PM  Result Value Ref Range   Glucose-Capillary 167 (H) 70 - 99 mg/dL   Comment 1 Notify RN   Glucose, capillary     Status: Abnormal   Collection Time: 03/11/18  9:05 PM  Result Value Ref Range   Glucose-Capillary 183 (H) 70 - 99 mg/dL   Comment 1 Notify RN   Glucose, capillary     Status: Abnormal   Collection Time: 03/12/18  7:56 AM  Result Value Ref Range   Glucose-Capillary 182 (H) 70  - 99 mg/dL  Glucose, capillary     Status: Abnormal   Collection Time: 03/12/18 11:56 AM  Result Value Ref Range   Glucose-Capillary 201 (H) 70 - 99 mg/dL  Glucose, capillary     Status: Abnormal   Collection Time: 03/12/18  5:17 PM  Result Value Ref Range   Glucose-Capillary 215 (H) 70 - 99 mg/dL  Glucose, capillary     Status: Abnormal   Collection Time: 03/12/18  9:09 PM  Result Value Ref Range   Glucose-Capillary 220 (  H) 70 - 99 mg/dL   Comment 1 Notify RN   Glucose, capillary     Status: Abnormal   Collection Time: 03/13/18  8:28 AM  Result Value Ref Range   Glucose-Capillary 197 (H) 70 - 99 mg/dL  Glucose, capillary     Status: Abnormal   Collection Time: 03/13/18 12:16 PM  Result Value Ref Range   Glucose-Capillary 232 (H) 70 - 99 mg/dL  Glucose, capillary     Status: Abnormal   Collection Time: 03/13/18  4:53 PM  Result Value Ref Range   Glucose-Capillary 228 (H) 70 - 99 mg/dL    Radiology Burnard Bunting With/wo Tbi  Result Date: 03/29/2018 LOWER EXTREMITY DOPPLER STUDY Indications: Gangrene, and peripheral artery disease.  Performing Technologist: Charlane Ferretti RT (R)(VS)  Examination Guidelines: A complete evaluation includes at minimum, Doppler waveform signals and systolic blood pressure reading at the level of bilateral brachial, anterior tibial, and posterior tibial arteries, when vessel segments are accessible. Bilateral testing is considered an integral part of a complete examination. Photoelectric Plethysmograph (PPG) waveforms and toe systolic pressure readings are included as required and additional duplex testing as needed. Limited examinations for reoccurring indications may be performed as noted.  ABI Findings: +---------+------------------+-----+----------+---------+ Right    Rt Pressure (mmHg)IndexWaveform  Comment   +---------+------------------+-----+----------+---------+ Brachial 135                                         +---------+------------------+-----+----------+---------+ ATA      93                0.69 monophasic          +---------+------------------+-----+----------+---------+ PTA      130               0.96 monophasic          +---------+------------------+-----+----------+---------+ Great Toe                                 amputated +---------+------------------+-----+----------+---------+ +---------+------------------+-----+----------+-------+ Left     Lt Pressure (mmHg)IndexWaveform  Comment +---------+------------------+-----+----------+-------+ Brachial 135                                      +---------+------------------+-----+----------+-------+ ATA      172               1.27 hyperemic         +---------+------------------+-----+----------+-------+ PTA      176               1.30 monophasic        +---------+------------------+-----+----------+-------+ Great Toe85                0.63 Abnormal          +---------+------------------+-----+----------+-------+ +-------+-----------+-----------+------------+------------+ ABI/TBIToday's ABIToday's TBIPrevious ABIPrevious TBI +-------+-----------+-----------+------------+------------+ Right  .96                                            +-------+-----------+-----------+------------+------------+ Left   1.30       .63                                 +-------+-----------+-----------+------------+------------+  Summary: Right: Resting right ankle-brachial index is within normal range. No evidence of significant right lower extremity arterial disease. Right great toe amputated. Second toe gangrenous. Left: Resting left ankle-brachial index is within normal range. No evidence of significant left lower extremity arterial disease. The left toe-brachial index is abnormal.  *See table(s) above for measurements and observations.  Electronically signed by Hortencia Pilar MD on 03/29/2018 at 5:22:57 PM.   Final      Assessment/Plan Mixed hyperlipidemia lipid control important in reducing the progression of atherosclerotic disease. Continue statin therapy   Uncontrolled type 2 diabetes mellitus with hyperglycemia (HCC) blood glucose control important in reducing the progression of atherosclerotic disease. Also, involved in wound healing. On appropriate medications.   Hypertension blood pressure control important in reducing the progression of atherosclerotic disease. On appropriate oral medications.  Swelling of limb Prominent on the left leg.  Likely a combination of reperfusion swelling as well as ruptured Baker's cyst.  We will continue the Unna boots for a few more weeks is continued ulceration and swelling.  A 3 layer Unna boot was placed on the left lower extremity today  Atherosclerosis of native arteries of the extremities with ulceration ([place]) His left foot ulceration is really not improved as much as I would have hoped over the last few weeks.  He is scheduled to see his podiatrist next week.  I will plan a shorter interval follow-up of 3 to 4 weeks and we can do ABIs at that time.  We know he has some right leg disease, but we are trying to get his left leg healed before turning our attention to the right leg.  If there is any question about his perfusion on the left leg, we may have to repeat an angiogram on the left.    Leotis Pain, MD  04/17/2018 3:12 PM    This note was created with Dragon medical transcription system.  Any errors from dictation are purely unintentional

## 2018-04-17 NOTE — Assessment & Plan Note (Signed)
His left foot ulceration is really not improved as much as I would have hoped over the last few weeks.  He is scheduled to see his podiatrist next week.  I will plan a shorter interval follow-up of 3 to 4 weeks and we can do ABIs at that time.  We know he has some right leg disease, but we are trying to get his left leg healed before turning our attention to the right leg.  If there is any question about his perfusion on the left leg, we may have to repeat an angiogram on the left.

## 2018-04-18 ENCOUNTER — Inpatient Hospital Stay
Admission: AD | Admit: 2018-04-18 | Discharge: 2018-04-23 | DRG: 617 | Disposition: A | Discharge status: Home Health | Payer: Medicare HMO | Attending: Internal Medicine | Admitting: Internal Medicine

## 2018-04-18 ENCOUNTER — Other Ambulatory Visit: Payer: Self-pay

## 2018-04-18 ENCOUNTER — Telehealth (INDEPENDENT_AMBULATORY_CARE_PROVIDER_SITE_OTHER): Payer: Self-pay

## 2018-04-18 DIAGNOSIS — I2510 Atherosclerotic heart disease of native coronary artery without angina pectoris: Secondary | ICD-10-CM | POA: Diagnosis not present

## 2018-04-18 DIAGNOSIS — Z79891 Long term (current) use of opiate analgesic: Secondary | ICD-10-CM

## 2018-04-18 DIAGNOSIS — Z880 Allergy status to penicillin: Secondary | ICD-10-CM | POA: Diagnosis not present

## 2018-04-18 DIAGNOSIS — E119 Type 2 diabetes mellitus without complications: Secondary | ICD-10-CM | POA: Diagnosis not present

## 2018-04-18 DIAGNOSIS — I130 Hypertensive heart and chronic kidney disease with heart failure and stage 1 through stage 4 chronic kidney disease, or unspecified chronic kidney disease: Secondary | ICD-10-CM | POA: Diagnosis not present

## 2018-04-18 DIAGNOSIS — Z89421 Acquired absence of other right toe(s): Secondary | ICD-10-CM | POA: Diagnosis not present

## 2018-04-18 DIAGNOSIS — F329 Major depressive disorder, single episode, unspecified: Secondary | ICD-10-CM | POA: Diagnosis present

## 2018-04-18 DIAGNOSIS — K219 Gastro-esophageal reflux disease without esophagitis: Secondary | ICD-10-CM | POA: Diagnosis present

## 2018-04-18 DIAGNOSIS — M869 Osteomyelitis, unspecified: Secondary | ICD-10-CM | POA: Diagnosis present

## 2018-04-18 DIAGNOSIS — M86172 Other acute osteomyelitis, left ankle and foot: Secondary | ICD-10-CM | POA: Diagnosis not present

## 2018-04-18 DIAGNOSIS — Z888 Allergy status to other drugs, medicaments and biological substances status: Secondary | ICD-10-CM

## 2018-04-18 DIAGNOSIS — E1142 Type 2 diabetes mellitus with diabetic polyneuropathy: Secondary | ICD-10-CM | POA: Diagnosis not present

## 2018-04-18 DIAGNOSIS — Z95820 Peripheral vascular angioplasty status with implants and grafts: Secondary | ICD-10-CM | POA: Diagnosis not present

## 2018-04-18 DIAGNOSIS — Z85828 Personal history of other malignant neoplasm of skin: Secondary | ICD-10-CM

## 2018-04-18 DIAGNOSIS — Z6841 Body Mass Index (BMI) 40.0 and over, adult: Secondary | ICD-10-CM | POA: Diagnosis not present

## 2018-04-18 DIAGNOSIS — I10 Essential (primary) hypertension: Secondary | ICD-10-CM | POA: Diagnosis not present

## 2018-04-18 DIAGNOSIS — G8929 Other chronic pain: Secondary | ICD-10-CM | POA: Diagnosis present

## 2018-04-18 DIAGNOSIS — Z87891 Personal history of nicotine dependence: Secondary | ICD-10-CM | POA: Diagnosis not present

## 2018-04-18 DIAGNOSIS — I739 Peripheral vascular disease, unspecified: Secondary | ICD-10-CM | POA: Diagnosis not present

## 2018-04-18 DIAGNOSIS — E1169 Type 2 diabetes mellitus with other specified complication: Secondary | ICD-10-CM | POA: Diagnosis not present

## 2018-04-18 DIAGNOSIS — M62838 Other muscle spasm: Secondary | ICD-10-CM | POA: Diagnosis not present

## 2018-04-18 DIAGNOSIS — Z91018 Allergy to other foods: Secondary | ICD-10-CM

## 2018-04-18 DIAGNOSIS — N400 Enlarged prostate without lower urinary tract symptoms: Secondary | ICD-10-CM | POA: Diagnosis not present

## 2018-04-18 DIAGNOSIS — E11621 Type 2 diabetes mellitus with foot ulcer: Principal | ICD-10-CM | POA: Diagnosis present

## 2018-04-18 DIAGNOSIS — Z91048 Other nonmedicinal substance allergy status: Secondary | ICD-10-CM

## 2018-04-18 DIAGNOSIS — G4733 Obstructive sleep apnea (adult) (pediatric): Secondary | ICD-10-CM | POA: Diagnosis present

## 2018-04-18 DIAGNOSIS — Z886 Allergy status to analgesic agent status: Secondary | ICD-10-CM

## 2018-04-18 DIAGNOSIS — Z794 Long term (current) use of insulin: Secondary | ICD-10-CM

## 2018-04-18 DIAGNOSIS — L03116 Cellulitis of left lower limb: Secondary | ICD-10-CM | POA: Diagnosis not present

## 2018-04-18 DIAGNOSIS — R531 Weakness: Secondary | ICD-10-CM | POA: Diagnosis not present

## 2018-04-18 DIAGNOSIS — L03032 Cellulitis of left toe: Secondary | ICD-10-CM | POA: Diagnosis not present

## 2018-04-18 DIAGNOSIS — E1151 Type 2 diabetes mellitus with diabetic peripheral angiopathy without gangrene: Secondary | ICD-10-CM | POA: Diagnosis present

## 2018-04-18 DIAGNOSIS — M549 Dorsalgia, unspecified: Secondary | ICD-10-CM | POA: Diagnosis not present

## 2018-04-18 DIAGNOSIS — L97529 Non-pressure chronic ulcer of other part of left foot with unspecified severity: Secondary | ICD-10-CM | POA: Diagnosis present

## 2018-04-18 DIAGNOSIS — Z8673 Personal history of transient ischemic attack (TIA), and cerebral infarction without residual deficits: Secondary | ICD-10-CM

## 2018-04-18 DIAGNOSIS — I252 Old myocardial infarction: Secondary | ICD-10-CM | POA: Diagnosis not present

## 2018-04-18 DIAGNOSIS — L97521 Non-pressure chronic ulcer of other part of left foot limited to breakdown of skin: Secondary | ICD-10-CM | POA: Diagnosis not present

## 2018-04-18 DIAGNOSIS — Z79899 Other long term (current) drug therapy: Secondary | ICD-10-CM

## 2018-04-18 DIAGNOSIS — Z950 Presence of cardiac pacemaker: Secondary | ICD-10-CM | POA: Diagnosis not present

## 2018-04-18 DIAGNOSIS — Z955 Presence of coronary angioplasty implant and graft: Secondary | ICD-10-CM | POA: Diagnosis not present

## 2018-04-18 DIAGNOSIS — F341 Dysthymic disorder: Secondary | ICD-10-CM | POA: Diagnosis not present

## 2018-04-18 DIAGNOSIS — Z7982 Long term (current) use of aspirin: Secondary | ICD-10-CM

## 2018-04-18 DIAGNOSIS — Z882 Allergy status to sulfonamides status: Secondary | ICD-10-CM

## 2018-04-18 DIAGNOSIS — I5032 Chronic diastolic (congestive) heart failure: Secondary | ICD-10-CM | POA: Diagnosis not present

## 2018-04-18 DIAGNOSIS — Z7902 Long term (current) use of antithrombotics/antiplatelets: Secondary | ICD-10-CM

## 2018-04-18 DIAGNOSIS — E1165 Type 2 diabetes mellitus with hyperglycemia: Secondary | ICD-10-CM

## 2018-04-18 DIAGNOSIS — L02612 Cutaneous abscess of left foot: Secondary | ICD-10-CM | POA: Diagnosis present

## 2018-04-18 LAB — CBC
HEMATOCRIT: 34.3 % — AB (ref 39.0–52.0)
Hemoglobin: 10.8 g/dL — ABNORMAL LOW (ref 13.0–17.0)
MCH: 25.2 pg — ABNORMAL LOW (ref 26.0–34.0)
MCHC: 31.5 g/dL (ref 30.0–36.0)
MCV: 80 fL (ref 80.0–100.0)
PLATELETS: 318 10*3/uL (ref 150–400)
RBC: 4.29 MIL/uL (ref 4.22–5.81)
RDW: 16.5 % — AB (ref 11.5–15.5)
WBC: 5.9 10*3/uL (ref 4.0–10.5)
nRBC: 0 % (ref 0.0–0.2)

## 2018-04-18 LAB — COMPREHENSIVE METABOLIC PANEL
ALT: 15 U/L (ref 0–44)
AST: 18 U/L (ref 15–41)
Albumin: 3.5 g/dL (ref 3.5–5.0)
Alkaline Phosphatase: 67 U/L (ref 38–126)
Anion gap: 9 (ref 5–15)
BILIRUBIN TOTAL: 0.3 mg/dL (ref 0.3–1.2)
BUN: 21 mg/dL (ref 8–23)
CO2: 27 mmol/L (ref 22–32)
Calcium: 8.7 mg/dL — ABNORMAL LOW (ref 8.9–10.3)
Chloride: 100 mmol/L (ref 98–111)
Creatinine, Ser: 0.93 mg/dL (ref 0.61–1.24)
GFR calc Af Amer: >60 mL/min (ref >60)
GFR calc non Af Amer: >60 mL/min (ref >60)
Glucose, Bld: 346 mg/dL — ABNORMAL HIGH (ref 70–99)
Potassium: 4.1 mmol/L (ref 3.5–5.1)
Sodium: 136 mmol/L (ref 135–145)
TOTAL PROTEIN: 6.7 g/dL (ref 6.5–8.1)

## 2018-04-18 LAB — GLUCOSE, CAPILLARY: GLUCOSE-CAPILLARY: 274 mg/dL — AB (ref 70–99)

## 2018-04-18 MED ORDER — BENAZEPRIL HCL 20 MG PO TABS
20.0000 mg | ORAL_TABLET | Freq: Two times a day (BID) | ORAL | Status: DC
  Administered 2018-04-18 – 2018-04-23 (×8): 20 mg via ORAL
  Filled 2018-04-18 (×11): qty 1

## 2018-04-18 MED ORDER — ENOXAPARIN SODIUM 40 MG/0.4ML ~~LOC~~ SOLN
40.0000 mg | Freq: Two times a day (BID) | SUBCUTANEOUS | Status: DC
  Administered 2018-04-18: 40 mg via SUBCUTANEOUS
  Filled 2018-04-18: qty 0.4

## 2018-04-18 MED ORDER — ASPIRIN EC 81 MG PO TBEC
81.0000 mg | DELAYED_RELEASE_TABLET | Freq: Every day | ORAL | Status: DC
  Administered 2018-04-20 – 2018-04-23 (×4): 81 mg via ORAL
  Filled 2018-04-18 (×5): qty 1

## 2018-04-18 MED ORDER — INSULIN ASPART 100 UNIT/ML ~~LOC~~ SOLN
0.0000 [IU] | Freq: Every day | SUBCUTANEOUS | Status: DC
  Administered 2018-04-18 – 2018-04-20 (×2): 3 [IU] via SUBCUTANEOUS
  Administered 2018-04-21: 2 [IU] via SUBCUTANEOUS
  Administered 2018-04-22: 4 [IU] via SUBCUTANEOUS
  Filled 2018-04-18 (×4): qty 1

## 2018-04-18 MED ORDER — FLUTICASONE PROPIONATE 50 MCG/ACT NA SUSP
2.0000 | Freq: Every day | NASAL | Status: DC
  Administered 2018-04-20 – 2018-04-23 (×4): 2 via NASAL
  Filled 2018-04-18: qty 16

## 2018-04-18 MED ORDER — METHOCARBAMOL 500 MG PO TABS
500.0000 mg | ORAL_TABLET | Freq: Three times a day (TID) | ORAL | Status: DC | PRN
  Administered 2018-04-18 – 2018-04-21 (×3): 500 mg via ORAL
  Filled 2018-04-18 (×3): qty 1

## 2018-04-18 MED ORDER — TAMSULOSIN HCL 0.4 MG PO CAPS
0.4000 mg | ORAL_CAPSULE | Freq: Every day | ORAL | Status: DC
  Administered 2018-04-20 – 2018-04-23 (×4): 0.4 mg via ORAL
  Filled 2018-04-18 (×5): qty 1

## 2018-04-18 MED ORDER — OXCARBAZEPINE 300 MG PO TABS
300.0000 mg | ORAL_TABLET | Freq: Every morning | ORAL | Status: DC
  Administered 2018-04-20 – 2018-04-23 (×4): 300 mg via ORAL
  Filled 2018-04-18 (×5): qty 1

## 2018-04-18 MED ORDER — PANTOPRAZOLE SODIUM 40 MG PO TBEC
40.0000 mg | DELAYED_RELEASE_TABLET | Freq: Every day | ORAL | Status: DC
  Administered 2018-04-19 – 2018-04-23 (×5): 40 mg via ORAL
  Filled 2018-04-18 (×6): qty 1

## 2018-04-18 MED ORDER — INSULIN ASPART 100 UNIT/ML ~~LOC~~ SOLN
0.0000 [IU] | Freq: Three times a day (TID) | SUBCUTANEOUS | Status: DC
  Administered 2018-04-19: 2 [IU] via SUBCUTANEOUS
  Administered 2018-04-19: 1 [IU] via SUBCUTANEOUS
  Administered 2018-04-20: 3 [IU] via SUBCUTANEOUS
  Administered 2018-04-20 – 2018-04-21 (×3): 5 [IU] via SUBCUTANEOUS
  Administered 2018-04-21: 3 [IU] via SUBCUTANEOUS
  Administered 2018-04-21: 5 [IU] via SUBCUTANEOUS
  Administered 2018-04-22 (×2): 2 [IU] via SUBCUTANEOUS
  Administered 2018-04-22 – 2018-04-23 (×2): 3 [IU] via SUBCUTANEOUS
  Administered 2018-04-23: 5 [IU] via SUBCUTANEOUS
  Administered 2018-04-23: 3 [IU] via SUBCUTANEOUS
  Filled 2018-04-18 (×14): qty 1

## 2018-04-18 MED ORDER — METRONIDAZOLE IN NACL 5-0.79 MG/ML-% IV SOLN
500.0000 mg | Freq: Three times a day (TID) | INTRAVENOUS | Status: DC
  Administered 2018-04-18 – 2018-04-22 (×11): 500 mg via INTRAVENOUS
  Filled 2018-04-18 (×14): qty 100

## 2018-04-18 MED ORDER — PREGABALIN 75 MG PO CAPS
200.0000 mg | ORAL_CAPSULE | Freq: Every day | ORAL | Status: DC
  Administered 2018-04-18 – 2018-04-22 (×5): 200 mg via ORAL
  Filled 2018-04-18 (×5): qty 1

## 2018-04-18 MED ORDER — OXCARBAZEPINE 300 MG PO TABS: 300.0000 mg | ORAL_TABLET | ORAL | Status: DC

## 2018-04-18 MED ORDER — INSULIN GLARGINE 100 UNIT/ML ~~LOC~~ SOLN
60.0000 [IU] | Freq: Every day | SUBCUTANEOUS | Status: DC
  Administered 2018-04-18 – 2018-04-20 (×3): 60 [IU] via SUBCUTANEOUS
  Filled 2018-04-18 (×4): qty 0.6

## 2018-04-18 MED ORDER — OXCARBAZEPINE 300 MG PO TABS
600.0000 mg | ORAL_TABLET | Freq: Every day | ORAL | Status: DC
  Administered 2018-04-18 – 2018-04-22 (×4): 600 mg via ORAL
  Filled 2018-04-18 (×6): qty 2

## 2018-04-18 MED ORDER — DULOXETINE HCL 60 MG PO CPEP
60.0000 mg | ORAL_CAPSULE | Freq: Every day | ORAL | Status: DC
  Administered 2018-04-18 – 2018-04-23 (×5): 60 mg via ORAL
  Filled 2018-04-18 (×6): qty 1

## 2018-04-18 MED ORDER — POLYETHYLENE GLYCOL 3350 17 G PO PACK: 17.0000 g | PACK | Freq: Every day | ORAL | Status: DC | PRN

## 2018-04-18 MED ORDER — SODIUM CHLORIDE 0.9 % IV SOLN
2.0000 g | Freq: Three times a day (TID) | INTRAVENOUS | Status: DC
  Administered 2018-04-18 – 2018-04-19 (×3): 2 g via INTRAVENOUS
  Filled 2018-04-18 (×6): qty 2

## 2018-04-18 MED ORDER — HYDROCHLOROTHIAZIDE 25 MG PO TABS
12.5000 mg | ORAL_TABLET | Freq: Every day | ORAL | Status: DC
  Administered 2018-04-20 – 2018-04-23 (×3): 12.5 mg via ORAL
  Filled 2018-04-18 (×5): qty 1

## 2018-04-18 MED ORDER — ONDANSETRON HCL 4 MG/2ML IJ SOLN: 4.0000 mg | Freq: Four times a day (QID) | INTRAMUSCULAR | Status: DC | PRN

## 2018-04-18 MED ORDER — PREGABALIN 75 MG PO CAPS: 200.0000 mg | ORAL_CAPSULE | ORAL | Status: DC

## 2018-04-18 MED ORDER — ACETAMINOPHEN 650 MG RE SUPP: 650.0000 mg | Freq: Four times a day (QID) | RECTAL | Status: DC | PRN

## 2018-04-18 MED ORDER — NITROGLYCERIN 0.4 MG SL SUBL
0.4000 mg | SUBLINGUAL_TABLET | SUBLINGUAL | Status: DC | PRN
  Administered 2018-04-21: 0.4 mg via SUBLINGUAL
  Filled 2018-04-18: qty 1

## 2018-04-18 MED ORDER — OMEGA-3-ACID ETHYL ESTERS 1 G PO CAPS
1.0000 g | ORAL_CAPSULE | Freq: Two times a day (BID) | ORAL | Status: DC
  Administered 2018-04-18 – 2018-04-23 (×8): 1 g via ORAL
  Filled 2018-04-18 (×8): qty 1

## 2018-04-18 MED ORDER — VANCOMYCIN HCL IN DEXTROSE 1-5 GM/200ML-% IV SOLN
1000.0000 mg | Freq: Three times a day (TID) | INTRAVENOUS | Status: DC
  Administered 2018-04-18 – 2018-04-19 (×2): 1000 mg via INTRAVENOUS
  Filled 2018-04-18 (×4): qty 200

## 2018-04-18 MED ORDER — OXYCODONE HCL ER 10 MG PO T12A
20.0000 mg | EXTENDED_RELEASE_TABLET | Freq: Two times a day (BID) | ORAL | Status: DC
  Administered 2018-04-18 – 2018-04-21 (×6): 20 mg via ORAL
  Filled 2018-04-18 (×6): qty 2

## 2018-04-18 MED ORDER — OXYCODONE HCL 5 MG PO TABS
5.0000 mg | ORAL_TABLET | ORAL | Status: DC | PRN
  Administered 2018-04-20 – 2018-04-23 (×6): 5 mg via ORAL
  Filled 2018-04-18 (×8): qty 1

## 2018-04-18 MED ORDER — PREGABALIN 75 MG PO CAPS
400.0000 mg | ORAL_CAPSULE | Freq: Every morning | ORAL | Status: DC
  Administered 2018-04-20 – 2018-04-21 (×2): 400 mg via ORAL
  Filled 2018-04-18 (×2): qty 5

## 2018-04-18 MED ORDER — ACETAMINOPHEN 325 MG PO TABS
650.0000 mg | ORAL_TABLET | Freq: Four times a day (QID) | ORAL | Status: DC | PRN
  Administered 2018-04-19: 650 mg via ORAL
  Filled 2018-04-18: qty 2

## 2018-04-18 MED ORDER — ONDANSETRON HCL 4 MG PO TABS: 4.0000 mg | ORAL_TABLET | Freq: Four times a day (QID) | ORAL | Status: DC | PRN

## 2018-04-18 MED ORDER — FENOFIBRATE 54 MG PO TABS
54.0000 mg | ORAL_TABLET | Freq: Every day | ORAL | Status: DC
  Administered 2018-04-18 – 2018-04-23 (×5): 54 mg via ORAL
  Filled 2018-04-18 (×6): qty 1

## 2018-04-18 NOTE — Telephone Encounter (Signed)
Nurse called to ask who is going to be responsible for the changing of the patients Unna boots, since he was in the office on yesterday and we changed it at the time of his visit?  I called her back to let her know that the patient only had his Unna boot changed here in the office because of his visit with the doctor and because he had a ultrasound, and that we remove the Unna boot in this case, and that they should continue changing the [employer].  I also instructed her that if they should need another order stating that they are to replace the Unna boots each time they visit the patient to clean his toe and foot, that she could fax over something and that we would have Dr. Lucky Cowboy sign it, or she can take a verbal.

## 2018-04-18 NOTE — Consult Note (Signed)
Reason for Consult: Nonhealing wound with osteomyelitis left foot with development of multiple allergies to oral antibiotics. Referring Physician: Jayton Wise is an 67 y.o. male.  HPI: This is a 67 year old male with a history of infection over the last couple of months with hospitalization back in October.  Underwent multiple debridements including bone and joint from his left great toe joint area.  He has developed multiple drug allergies for antibiotics from his previous hospital stay cultures that were taken in for nonhealing of the wound.  Decision was made for re-hospitalization with re-debridement of the left foot and infectious disease consult for antibiotic management.  Past Medical History:  Diagnosis Date  . Allergy   . Basal cell carcinoma    forehead  . BPH (benign prostatic hyperplasia)   . Chronic back pain   . Depression   . Diabetes ([place])   . GERD (gastroesophageal reflux disease)   . Heart attack ([place])   . Hypertension   . Morbid obesity ([place])   . Obstructive sleep apnea   . Osteomyelitis of foot ([place])   . Status post insertion of spinal cord stimulator   . Stroke ([place])   . UTI (lower urinary tract infection)     Past Surgical History:  Procedure Laterality Date  . CARDIAC CATHETERIZATION N/A 12/19/2014   Procedure: Coronary Stent Intervention;  Surgeon: Victor Forward, MD;  Location: [facility];  Service: Cardiovascular;  Laterality: N/A;  . CARDIAC CATHETERIZATION Left 12/19/2014   Procedure: Left Heart Cath and Coronary Angiography;  Surgeon: Victor Treyshon, MD;  Location: [facility];  Service: Cardiovascular;  Laterality: Left;  . IRRIGATION AND DEBRIDEMENT FOOT Left 03/02/2018   Procedure: IRRIGATION AND DEBRIDEMENT FOOT;  Surgeon: Victor Wise, DPM;  Location: [facility];  Service: Podiatry;  Laterality: Left;  . IRRIGATION AND DEBRIDEMENT FOOT Left 03/08/2018   Procedure: IRRIGATION AND DEBRIDEMENT FOOT AND BONE;  Surgeon: Victor Wise, DPM;  Location: [facility];  Service: Podiatry;  Laterality: Left;  . LOWER EXTREMITY ANGIOGRAPHY Left 03/05/2018   Procedure: Lower Extremity Angiography;  Surgeon: Victor Huxley, MD;  Location: [facility];  Service: Cardiovascular;  Laterality: Left;  . LOWER EXTREMITY ANGIOGRAPHY Left 03/08/2018   Procedure: LOWER EXTREMITY ANGIOGRAPHY;  Surgeon: Victor Huxley, MD;  Location: [facility];  Service: Cardiovascular;  Laterality: Left;  . PACEMAKER INSERTION Left 11/02/2015   Procedure: INSERTION PACEMAKER;  Surgeon: Victor Cowman, MD;  Location: [facility];  Service: Cardiovascular;  Laterality: Left;  . Pain Stimulator    . Right toe amputation      Family History  Problem Relation Age of Onset  . Breast cancer Mother   . Cancer Mother   . Hypertension Mother   . Lung cancer Father   . Hypertension Father   . Heart disease Father   . Cancer Father   . Kidney disease Sister   . Prostate cancer Neg Hx     Social History:  reports that he has quit smoking. His smoking use included cigarettes. He has never used smokeless tobacco. He reports that he drank alcohol. He reports that he does not use drugs.  Allergies:  Allergies  Allergen Reactions  . Amoxicillin Other (See Comments)    Has never taken amoxicillin -ENT told him not to take (? Had skin test) Has patient had a PCN reaction causing immediate rash, facial/tongue/throat swelling, SOB or lightheadedness with hypotension: No Has patient had a PCN reaction causing  severe rash involving mucus membranes or skin necrosis: No Has patient had a PCN reaction that required hospitalization No Has patient had a PCN reaction occurring within the last 10 years: No If above answers are "NO", then may proceed w/ Cephalo  . Other Anaphylaxis, Itching and Other (See Comments)    Pt states that he is allergic to Endopa.  Reaction:  Anaphylaxis  Pt states that he is allergic to Metabisulfites Reaction:  Itching   .  Penicillins Other (See Comments)    Arm turned "blue" at injection site (no treatment needed) Has patient had a PCN reaction causing immediate rash, facial/tongue/throat swelling, SOB or lightheadedness with hypotension: No Has patient had a PCN reaction causing severe rash involving mucus membranes or skin necrosis: No Has patient had a PCN reaction that required hospitalization No Has patient had a PCN reaction occurring within the last 10 years: No If all of the above answers are "NO", then may proceed with Cephalosporin use.  [name] Hydralazine Other (See Comments)    Reaction:  Cramping of extremities  . Cephalexin Itching  . Crestor [Rosuvastatin Calcium] Other (See Comments)    Reaction:  Pt is unable to move arms/legs   . Metoprolol Nausea And Vomiting  . Red Dye Itching  . Statins Other (See Comments)    Reaction:  Pt is unable to move arms/legs  . Sulfa Antibiotics Itching    Other reaction(s): Unknown  . Yellow Dyes (Non-Tartrazine) Itching  . Aspirin Itching and Other (See Comments)    Pt states that he is able to use in lower doses.    . Tape Rash    Please use "paper" tape only. Please use "paper" tape only.    Medications:  Scheduled: . aspirin EC  81 mg Oral Daily  . benazepril  20 mg Oral BID  . DULoxetine  60 mg Oral Daily  . enoxaparin (LOVENOX) injection  40 mg Subcutaneous Q12H  . fenofibrate  54 mg Oral Q supper  . fluticasone  2 spray Each Nare Daily  . hydrochlorothiazide  12.5 mg Oral Daily  . insulin aspart  0-5 Units Subcutaneous QHS  . [START ON 04/19/2018] insulin aspart  0-9 Units Subcutaneous TID WC  . insulin glargine  60 Units Subcutaneous QHS  . omega-3 acid ethyl esters  1 g Oral BID  . [START ON 04/19/2018] OXcarbazepine  300 mg Oral q morning - 10a  . OXcarbazepine  600 mg Oral QHS  . oxyCODONE  20 mg Oral Q12H  . pantoprazole  40 mg Oral Daily  . pregabalin  200 mg Oral QHS  . [START ON 04/19/2018] pregabalin  400 mg Oral q morning - 10a  .  tamsulosin  0.4 mg Oral QPC supper    Results for orders placed or performed during the hospital encounter of 04/18/18 (from the past 48 hour(s))  CBC     Status: Abnormal   Collection Time: 04/18/18  5:54 PM  Result Value Ref Range   WBC 5.9 4.0 - 10.5 K/uL   RBC 4.29 4.22 - 5.81 MIL/uL   Hemoglobin 10.8 (L) 13.0 - 17.0 g/dL   HCT 34.3 (L) 39.0 - 52.0 %   MCV 80.0 80.0 - 100.0 fL   MCH 25.2 (L) 26.0 - 34.0 pg   MCHC 31.5 30.0 - 36.0 g/dL   RDW 16.5 (H) 11.5 - 15.5 %   Platelets 318 150 - 400 K/uL   nRBC 0.0 0.0 - 0.2 %    Comment: Performed  at [facility], [address] [place], [place] [address]  Comprehensive metabolic panel     Status: Abnormal   Collection Time: 04/18/18  5:54 PM  Result Value Ref Range   Sodium 136 135 - 145 mmol/L   Potassium 4.1 3.5 - 5.1 mmol/L   Chloride 100 98 - 111 mmol/L   CO2 27 22 - 32 mmol/L   Glucose, Bld 346 (H) 70 - 99 mg/dL   BUN 21 8 - 23 mg/dL   Creatinine, Ser 0.93 0.61 - 1.24 mg/dL   Calcium 8.7 (L) 8.9 - 10.3 mg/dL   Total Protein 6.7 6.5 - 8.1 g/dL   Albumin 3.5 3.5 - 5.0 g/dL   AST 18 15 - 41 U/L   ALT 15 0 - 44 U/L   Alkaline Phosphatase 67 38 - 126 U/L   Total Bilirubin 0.3 0.3 - 1.2 mg/dL   GFR calc non Af Amer >60 >60 mL/min   GFR calc Af Amer >60 >60 mL/min   Anion gap 9 5 - 15    Comment: Performed at [facility], [address] [place], [place] [address]    No results found.  Review of Systems  Constitutional: Negative for chills and fever.  HENT: Negative for hearing loss and tinnitus.   Eyes: Negative for blurred vision and double vision.  Respiratory: Negative for cough and shortness of breath.   Cardiovascular: Negative for chest pain and palpitations.  Gastrointestinal: Negative for nausea and vomiting.  Genitourinary: Negative for dysuria and frequency.  Musculoskeletal: Negative for joint pain and myalgias.  Skin:       Increased redness and swelling at the surgical site on his left  foot.  Continues to have drainage on his wound  Neurological:       Patient does relate some numbness and paresthesias in the feet related to his diabetes.  Endo/Heme/Allergies: Negative for polydipsia.  Psychiatric/Behavioral: Negative.    Blood pressure (!) 151/66, pulse 60, temperature 97.8 F (36.6 C), temperature source Oral, resp. rate 16, SpO2 91 %. Physical Exam  Cardiovascular:  DP and PT pulses are diminished but palpable.  Musculoskeletal:  Adequate range of motion of the pedal joints.  Muscle testing deferred.  Previous debridement of the left great toe joint.  Neurological:  Loss of protective threshold with a monofilament wire in the feet and digits.  Skin:  Some significant edema is noted bilateral.  Focal erythema and edema around the left great toe joint at the surgical site.  Continued full-thickness open wound down to the level exposed bone at the first metatarsal and base of the proximal phalanx.  Some moderate drainage is noted    Assessment/Plan: Assessment: 1.  Cellulitis with osteomyelitis left forefoot. 2.  Diabetes with associated neuropathy. 3.  Peripheral vascular disease.  Plan: Discussed with the patient re-debriding the infection in his left foot with probable amputation of the great toe.  Discussed that we will try to schedule this in the next couple of days.  Also discussed his need for a infectious disease consult as he has developed allergies to most all oral antibiotics that were covering susceptibilities to his previous cultures.  I will check with the OR tomorrow about scheduling for his debridement in the next day or 2.  Victor Wise 04/18/2018, 7:37 PM

## 2018-04-18 NOTE — H&P (Signed)
[facility] [place] NAME: Victor Wise    MR#:  [id number]  DATE OF BIRTH:  08-02-1950  DATE OF ADMISSION:  04/18/2018  PRIMARY CARE PHYSICIAN: Lavera Guise, MD   REQUESTING/REFERRING PHYSICIAN: Dr. Sharlotte Alamo  CHIEF COMPLAINT:  No chief complaint on file. Left diabetic foot ulcer  HISTORY OF PRESENT ILLNESS:  Victor Wise  is a 67 y.o. male with a known history of obesity, hypertension, diabetes, diabetic neuropathy,, obstructive sleep apnea, depression, left foot osteomyelitis, previous CVA, chronic back pain who presents to the hospital directly admitted from podiatrist office due to a nonhealing diabetic foot ulcer on the left foot.  Patient recently was treated with oral antibiotics and underwent debridement and also angiogram of the left foot with intervention but despite that the ulcer has not been improving.  He saw his vascular surgeon who recommended close outpatient follow-up and also saw his podiatrist today who thought his left foot does not look any better and thinks he may need further IV antibiotics and debridement.  He was therefore referred to the hospital for direct admission.  Patient denies any fevers or chills any nausea or vomiting abdominal pain chest pain or any other associated symptoms presently.  PAST MEDICAL HISTORY:   Past Medical History:  Diagnosis Date  . Allergy   . Basal cell carcinoma    forehead  . BPH (benign prostatic hyperplasia)   . Chronic back pain   . Depression   . Diabetes ([place])   . GERD (gastroesophageal reflux disease)   . Heart attack ([place])   . Hypertension   . Morbid obesity ([place])   . Obstructive sleep apnea   . Osteomyelitis of foot ([place])   . Status post insertion of spinal cord stimulator   . Stroke ([place])   . UTI (lower urinary tract infection)     PAST SURGICAL HISTORY:   Past Surgical History:  Procedure Laterality Date  . CARDIAC CATHETERIZATION N/A 12/19/2014   Procedure:  Coronary Stent Intervention;  Surgeon: Charolette Forward, MD;  Location: [facility];  Service: Cardiovascular;  Laterality: N/A;  . CARDIAC CATHETERIZATION Left 12/19/2014   Procedure: Left Heart Cath and Coronary Angiography;  Surgeon: Dionisio Danni, MD;  Location: [facility];  Service: Cardiovascular;  Laterality: Left;  . IRRIGATION AND DEBRIDEMENT FOOT Left 03/02/2018   Procedure: IRRIGATION AND DEBRIDEMENT FOOT;  Surgeon: Samara Deist, DPM;  Location: [facility];  Service: Podiatry;  Laterality: Left;  . IRRIGATION AND DEBRIDEMENT FOOT Left 03/08/2018   Procedure: IRRIGATION AND DEBRIDEMENT FOOT AND BONE;  Surgeon: Sharlotte Alamo, DPM;  Location: [facility];  Service: Podiatry;  Laterality: Left;  . LOWER EXTREMITY ANGIOGRAPHY Left 03/05/2018   Procedure: Lower Extremity Angiography;  Surgeon: Algernon Huxley, MD;  Location: [facility];  Service: Cardiovascular;  Laterality: Left;  . LOWER EXTREMITY ANGIOGRAPHY Left 03/08/2018   Procedure: LOWER EXTREMITY ANGIOGRAPHY;  Surgeon: Algernon Huxley, MD;  Location: [facility];  Service: Cardiovascular;  Laterality: Left;  . PACEMAKER INSERTION Left 11/02/2015   Procedure: INSERTION PACEMAKER;  Surgeon: Isaias Cowman, MD;  Location: [facility];  Service: Cardiovascular;  Laterality: Left;  . Pain Stimulator    . Right toe amputation      SOCIAL HISTORY:   Social History   Tobacco Use  . Smoking status: Former Smoker    Types: Cigarettes  . Smokeless tobacco: Never Used  . Tobacco comment: quit 45 years  Substance Use Topics  . Alcohol use: Not Currently    Alcohol/week: 0.0 standard drinks    Comment: have not had alcohol in 56yrs    FAMILY HISTORY:   Family History  Problem Relation Age of Onset  . Breast cancer Mother   . Cancer Mother   . Hypertension Mother   . Lung cancer Father   . Hypertension Father   . Heart disease Father   . Cancer Father   . Kidney disease Sister   . Prostate cancer Neg Hx      DRUG ALLERGIES:   Allergies  Allergen Reactions  . Amoxicillin Other (See Comments)    Has never taken amoxicillin -ENT told him not to take (? Had skin test) Has patient had a PCN reaction causing immediate rash, facial/tongue/throat swelling, SOB or lightheadedness with hypotension: No Has patient had a PCN reaction causing severe rash involving mucus membranes or skin necrosis: No Has patient had a PCN reaction that required hospitalization No Has patient had a PCN reaction occurring within the last 10 years: No If above answers are "NO", then may proceed w/ Cephalo  . Other Anaphylaxis, Itching and Other (See Comments)    Pt states that he is allergic to Endopa.  Reaction:  Anaphylaxis  Pt states that he is allergic to Metabisulfites Reaction:  Itching   . Penicillins Other (See Comments)    Arm turned "blue" at injection site (no treatment needed) Has patient had a PCN reaction causing immediate rash, facial/tongue/throat swelling, SOB or lightheadedness with hypotension: No Has patient had a PCN reaction causing severe rash involving mucus membranes or skin necrosis: No Has patient had a PCN reaction that required hospitalization No Has patient had a PCN reaction occurring within the last 10 years: No If all of the above answers are "NO", then may proceed with Cephalosporin use.  [name] Hydralazine Other (See Comments)    Reaction:  Cramping of extremities  . Cephalexin Itching  . Crestor [Rosuvastatin Calcium] Other (See Comments)    Reaction:  Pt is unable to move arms/legs   . Metoprolol Nausea And Vomiting  . Red Dye Itching  . Statins Other (See Comments)    Reaction:  Pt is unable to move arms/legs  . Sulfa Antibiotics Itching    Other reaction(s): Unknown  . Yellow Dyes (Non-Tartrazine) Itching  . Aspirin Itching and Other (See Comments)    Pt states that he is able to use in lower doses.    . Tape Rash    Please use "paper" tape only. Please use "paper" tape only.     REVIEW OF SYSTEMS:   Review of Systems  Constitutional: Negative for fever and weight loss.  HENT: Negative for congestion, nosebleeds and tinnitus.   Eyes: Negative for blurred vision, double vision and redness.  Respiratory: Negative for cough, hemoptysis and shortness of breath.   Cardiovascular: Negative for chest pain, orthopnea, leg swelling and PND.  Gastrointestinal: Negative for abdominal pain, diarrhea, melena, nausea and vomiting.  Genitourinary: Negative for dysuria, hematuria and urgency.  Musculoskeletal: Negative for falls and joint pain.  Neurological: Negative for dizziness, tingling, sensory change, focal weakness, seizures, weakness and headaches.  Endo/Heme/Allergies: Negative for polydipsia. Does not bruise/bleed easily.  Psychiatric/Behavioral: Negative for depression and memory loss. The patient is not nervous/anxious.     MEDICATIONS AT HOME:   Prior to Admission medications   Medication Sig Start Date End Date Taking? Authorizing Provider  aspirin EC 81 MG tablet Take 81 mg by  mouth daily.    [provider]  benazepril (LOTENSIN) 20 MG tablet TAKE 1 TABLET BY MOUTH TWICE DAILY Patient taking differently: Take 20 mg by mouth 2 (two) times daily.  03/01/18   Ronnell Freshwater, NP  cefdinir (OMNICEF) 300 MG capsule Take 1 capsule (300 mg total) by mouth every 12 (twelve) hours. 03/12/18   Salary, Avel Peace, MD  ciprofloxacin (CIPRO) 500 MG tablet Take by mouth. 04/11/18 04/21/18  [provider]  clindamycin (CLEOCIN) 300 MG capsule Take by mouth. 04/11/18 04/25/18  [provider]  clopidogrel (PLAVIX) 75 MG tablet Take 1 tablet (75 mg total) by mouth daily. 07/17/17   Ronnell Freshwater, NP  DULoxetine (CYMBALTA) 60 MG capsule Take 1 capsule (60 mg total) by mouth daily. 03/13/18   Salary, Avel Peace, MD  Exenatide ER (BYDUREON) 2 MG PEN Inject 2 mg into the skin once a week. Patient taking differently: Inject 2 mg into the skin every  Wednesday.  07/17/17   Ronnell Freshwater, NP  fenofibrate 54 MG tablet TAKE 1 TABLET BY MOUTH A DAY AT SUPPERTIME Patient taking differently: Take 54 mg by mouth daily with supper.  12/06/17   Ronnell Freshwater, NP  fluticasone (FLONASE) 50 MCG/ACT nasal spray Place 2 sprays into both nostrils daily.    [provider]  hydrochlorothiazide (HYDRODIURIL) 12.5 MG tablet Take 1 tablet (12.5 mg total) by mouth daily. 09/04/17   Ronnell Freshwater, NP  insulin aspart (NOVOLOG) 100 UNIT/ML injection Inject 0-5 Units into the skin at bedtime. 03/13/18   Salary, Holly Bodily D, MD  insulin glargine (LANTUS) 100 UNIT/ML injection Inject 0.6 mLs (60 Units total) into the skin daily. Patient taking differently: Inject 60 Units into the skin at bedtime.  09/05/17   Ronnell Freshwater, NP  Insulin Pen Needle (B-D UF III MINI PEN NEEDLES) 31G X 5 MM MISC Use as directed with insulin e11.65 12/26/17   Ronnell Freshwater, NP  insulin regular (NOVOLIN R) 100 units/mL injection Patient to use Novolin R [place] TiD prior to meals per sliding scale instructions. Max daily dose is 45 units per day. 07/19/17   Ronnell Freshwater, NP  INSULIN SYRINGE 1CC/29G 29G X 1/2" 1 ML MISC Insulin injections QID, use with lantus and regular insulin. DX E11.65 12/28/17   Ronnell Freshwater, NP  Insulin Syringe-Needle U-100 (BD INSULIN SYRINGE U/F 1/2UNIT) 31G X 5/16" 0.3 ML MISC Indulin injection QID. Use with lantus and regular insulin Dx. 11.65 01/08/18   Lavera Guise, MD  methocarbamol (ROBAXIN) 500 MG tablet Take 1 tablet (500 mg total) by mouth every 8 (eight) hours as needed for muscle spasms. 03/12/18   Salary, Avel Peace, MD  NARCAN 4 MG/0.1ML LIQD Take 4 mg by mouth as needed (for opioid overdose).     [provider]  nitroGLYCERIN (NITROSTAT) 0.4 MG SL tablet Place 1 tablet (0.4 mg total) under the tongue every 5 (five) minutes x 3 doses as needed for chest pain. 12/21/14   Dixie Dials, MD  omega-3 acid ethyl esters (LOVAZA) 1 g  capsule Take 1 capsule (1 g total) by mouth 2 (two) times daily. 12/28/17   Ronnell Freshwater, NP  Oxcarbazepine (TRILEPTAL) 300 MG tablet Take 300-600 mg by mouth See admin instructions. Take 1 tablet (300MG ) by mouth every morning and 2 tablets (600MG ) by mouth every night    [provider]  oxyCODONE (OXY IR/ROXICODONE) 5 MG immediate release tablet Take 1 tablet (5 mg  total) by mouth every 4 (four) hours as needed for moderate pain. 03/12/18   Salary, Avel Peace, MD  pantoprazole (PROTONIX) 40 MG tablet TAKE 1 TABLET BY MOUTH A DAY FOR REFLUX Patient taking differently: Take 40 mg by mouth daily.  12/26/17   Ronnell Freshwater, NP  polyethylene glycol (MIRALAX / GLYCOLAX) packet Take 17 g by mouth daily as needed.    [provider]  pregabalin (LYRICA) 200 MG capsule Take 200 mg by mouth See admin instructions. Take 2 capsules (400MG ) by mouth every morning and 1 capsule (200MG ) by mouth every night    [provider]  senna (SENOKOT) 8.6 MG TABS tablet Take 1 tablet (8.6 mg total) by mouth 2 (two) times daily. 02/21/16   Gladstone Lighter, MD  tamsulosin (FLOMAX) 0.4 MG CAPS capsule Take 1 capsule (0.4 mg total) by mouth daily after supper. 12/27/17   Ronnell Freshwater, NP      VITAL SIGNS:  There were no vitals taken for this visit.  PHYSICAL EXAMINATION:  Physical Exam  GENERAL:  67 y.o.-year-old obese patient sitting up in chair in no acute distress.  EYES: Pupils equal, round, reactive to light and accommodation. No scleral icterus. Extraocular muscles intact.  HEENT: Head atraumatic, normocephalic. Oropharynx and nasopharynx clear. No oropharyngeal erythema, moist oral mucosa  NECK:  Supple, no jugular venous distention. No thyroid enlargement, no tenderness.  LUNGS: Normal breath sounds bilaterally, no wheezing, rales, rhonchi. No use of accessory muscles of respiration.  CARDIOVASCULAR: S1, S2 RRR. No murmurs, rubs, gallops, clicks.  ABDOMEN: Soft,  nontender, nondistended. Bowel sounds present. No organomegaly or mass.  EXTREMITIES: No pedal edema, cyanosis, or clubbing. + 2 pedal & radial pulses b/l.  Left diabetic foot ulcer with dressing covering the whole foot and cam boot on.  NEUROLOGIC: Cranial nerves II through XII are intact. No focal Motor or sensory deficits appreciated b/l. Globally weak PSYCHIATRIC: The patient is alert and oriented x 3.  SKIN: No obvious rash, lesion, or ulcer.   LABORATORY PANEL:   CBC No results for input(s): WBC, HGB, HCT, PLT in the last 168 hours. ------------------------------------------------------------------------------------------------------------------  Chemistries  No results for input(s): NA, K, CL, CO2, GLUCOSE, BUN, CREATININE, CALCIUM, MG, AST, ALT, ALKPHOS, BILITOT in the last 168 hours.  Invalid input(s): GFRCGP ------------------------------------------------------------------------------------------------------------------  Cardiac Enzymes No results for input(s): TROPONINI in the last 168 hours. ------------------------------------------------------------------------------------------------------------------  RADIOLOGY:  No results found.   IMPRESSION AND PLAN:   67 year old male with past medical history of diabetes, diabetic neuropathy, morbid obesity, obstructive sleep apnea, hypertension, left diabetic foot ulcer, osteomyelitis who presents to the hospital directly admitted from podiatrist office due to a nonhealing left diabetic foot ulcer.  1.  Left diabetic foot ulcer- patient has been treated as an outpatient on oral antibiotics with recent angiogram with intervention and debridement but has not improved.  Patient was seen by his podiatrist who thought that the ulcer needed further debridement and possibly IV antibiotics. - Patient is clinically afebrile and hemodynamically stable.  We will admit the patient and start him on broad-spectrum IV antibiotics.  Patient has  multiple allergies.  We will start the patient on IV vancomycin, aztreonam and Flagyl. - We will consult podiatry.  Patient will also probably need a infectious disease consult.  2.  Diabetes type 2 with neuropathy-continue Lantus, sliding scale insulin.  Placed on carb controlled diet.  3.  Diabetic neuropathy- continue Lyrica.  4.  Chronic back pain- continue OxyContin and oxycodone as needed.  5.  Obstructive sleep apnea-continue CPAP at bedtime.  6.  Essential hypertension-continue benazepril, hydrochlorthiazide.  7.  BPH-no urinary retention.  Continue Flomax.  8.  GERD-continue Protonix.    All the records are reviewed and case discussed with ED provider. Management plans discussed with the patient, family and they are in agreement.  CODE STATUS: Full code  TOTAL TIME TAKING CARE OF THIS PATIENT: 45 minutes.    Henreitta Leber M.D on 04/18/2018 at 5:31 PM  Between 7am to 6pm - Pager - [phone]  After 6pm go to [email or web address] - password [email or web address]  [facility]  Office  [phone]  CC: Primary care physician; Lavera Guise, MD

## 2018-04-18 NOTE — Progress Notes (Signed)
Pt stated his daughter was going to bring is CPAP from home.

## 2018-04-18 NOTE — Progress Notes (Signed)
Changed Enoxaparin 40 mg SQ q24h to q12h for BMI > 40.  Paticia Stack, PharmD Pharmacy Resident  04/18/2018 5:39 PM

## 2018-04-18 NOTE — Consult Note (Signed)
Pharmacy Antibiotic Note  Victor Wise is a 67 y.o. male admitted on 04/18/2018 with wound infection.  Pharmacy has been consulted for Aztreonam and Vancomycin dosing.  Plan: Aztreonam 2g q 8hr  Vancomycin 1000mg  IV every 8 hours.  Goal trough 10-15 mcg/mL (wound infection/cellulitis)  Ke = 0.090, t 1/2 = 7.7, Vd = 93.2  Will recheck VT prior to the 5th dose (no stacked dosing) 12/6 @ 0330 with an anticipated trough of 11  Temp (24hrs), Avg:97.8 F (36.6 C), Min:97.8 F (36.6 C), Max:97.8 F (36.6 C)  Recent Labs  Lab 04/18/18 1754  WBC 5.9  CREATININE 0.93    Estimated Creatinine Clearance: 102.7 mL/min (by C-G formula based on SCr of 0.93 mg/dL).    Allergies  Allergen Reactions  . Amoxicillin Other (See Comments)    Has never taken amoxicillin -ENT told him not to take (? Had skin test) Has patient had a PCN reaction causing immediate rash, facial/tongue/throat swelling, SOB or lightheadedness with hypotension: No Has patient had a PCN reaction causing severe rash involving mucus membranes or skin necrosis: No Has patient had a PCN reaction that required hospitalization No Has patient had a PCN reaction occurring within the last 10 years: No If above answers are "NO", then may proceed w/ Cephalo  . Other Anaphylaxis, Itching and Other (See Comments)    Pt states that he is allergic to Endopa.  Reaction:  Anaphylaxis  Pt states that he is allergic to Metabisulfites Reaction:  Itching   . Penicillins Other (See Comments)    Arm turned "blue" at injection site (no treatment needed) Has patient had a PCN reaction causing immediate rash, facial/tongue/throat swelling, SOB or lightheadedness with hypotension: No Has patient had a PCN reaction causing severe rash involving mucus membranes or skin necrosis: No Has patient had a PCN reaction that required hospitalization No Has patient had a PCN reaction occurring within the last 10 years: No If all of the above answers are  "NO", then may proceed with Cephalosporin use.  [name] Hydralazine Other (See Comments)    Reaction:  Cramping of extremities  . Cephalexin Itching  . Crestor [Rosuvastatin Calcium] Other (See Comments)    Reaction:  Pt is unable to move arms/legs   . Metoprolol Nausea And Vomiting  . Red Dye Itching  . Statins Other (See Comments)    Reaction:  Pt is unable to move arms/legs  . Sulfa Antibiotics Itching    Other reaction(s): Unknown  . Yellow Dyes (Non-Tartrazine) Itching  . Aspirin Itching and Other (See Comments)    Pt states that he is able to use in lower doses.    . Tape Rash    Please use "paper" tape only. Please use "paper" tape only.    Antimicrobials this admission: Vancomycin 12/4  >>  Metronidazole 12/4 >>  Aztreonam 12/4 >>  Dose adjustments this admission: N/A  Microbiology results: None at this time  Thank you for allowing pharmacy to be a part of this patient's care.  Lu Duffel, PharmD Clinical Pharmacist 04/18/2018 7:13 PM

## 2018-04-18 NOTE — Progress Notes (Signed)
[facility]  Patient Status: Active  [facility] is providing the following services: SN, PT, SW  If patient discharges after hours, please call [phone].   Victor Wise 04/18/2018, 5:09 PM

## 2018-04-18 NOTE — Progress Notes (Signed)
Pt. Declined hospital provided CPAP. Pt. Having his home CPAP unit brought to hospital.

## 2018-04-19 ENCOUNTER — Ambulatory Visit: Admit: 2018-04-19 | Payer: Medicare HMO | Admitting: Podiatry

## 2018-04-19 ENCOUNTER — Inpatient Hospital Stay: Payer: Medicare HMO | Admitting: Anesthesiology

## 2018-04-19 ENCOUNTER — Encounter
Admission: AD | Disposition: A | Discharge status: Home Health | Payer: Self-pay | Source: Home / Self Care | Attending: Internal Medicine

## 2018-04-19 ENCOUNTER — Encounter: Payer: Self-pay | Admitting: Anesthesiology

## 2018-04-19 HISTORY — PX: IRRIGATION AND DEBRIDEMENT FOOT: SHX6602

## 2018-04-19 HISTORY — PX: AMPUTATION TOE: SHX6595

## 2018-04-19 LAB — BASIC METABOLIC PANEL
ANION GAP: 6 (ref 5–15)
BUN: 18 mg/dL (ref 8–23)
CALCIUM: 8.5 mg/dL — AB (ref 8.9–10.3)
CO2: 29 mmol/L (ref 22–32)
Chloride: 104 mmol/L (ref 98–111)
Creatinine, Ser: 0.84 mg/dL (ref 0.61–1.24)
Glucose, Bld: 301 mg/dL — ABNORMAL HIGH (ref 70–99)
POTASSIUM: 3.8 mmol/L (ref 3.5–5.1)
SODIUM: 139 mmol/L (ref 135–145)

## 2018-04-19 LAB — CBC
HCT: 32.5 % — ABNORMAL LOW (ref 39.0–52.0)
HEMOGLOBIN: 10.2 g/dL — AB (ref 13.0–17.0)
MCH: 25 pg — AB (ref 26.0–34.0)
MCHC: 31.4 g/dL (ref 30.0–36.0)
MCV: 79.7 fL — ABNORMAL LOW (ref 80.0–100.0)
PLATELETS: 271 10*3/uL (ref 150–400)
RBC: 4.08 MIL/uL — AB (ref 4.22–5.81)
RDW: 16.3 % — ABNORMAL HIGH (ref 11.5–15.5)
WBC: 4.4 10*3/uL (ref 4.0–10.5)
nRBC: 0 % (ref 0.0–0.2)

## 2018-04-19 LAB — GLUCOSE, CAPILLARY
GLUCOSE-CAPILLARY: 148 mg/dL — AB (ref 70–99)
Glucose-Capillary: 155 mg/dL — ABNORMAL HIGH (ref 70–99)
Glucose-Capillary: 98 mg/dL (ref 70–99)
Glucose-Capillary: 122 mg/dL — ABNORMAL HIGH (ref 70–99)

## 2018-04-19 SURGERY — AMPUTATION, TOE
Anesthesia: General | Site: Foot | Laterality: Left

## 2018-04-19 MED ORDER — ONDANSETRON HCL 4 MG/2ML IJ SOLN
INTRAMUSCULAR | Status: AC
  Filled 2018-04-19: qty 2

## 2018-04-19 MED ORDER — SUGAMMADEX SODIUM 500 MG/5ML IV SOLN
INTRAVENOUS | Status: DC | PRN
  Administered 2018-04-19: 270 mg via INTRAVENOUS

## 2018-04-19 MED ORDER — SUCCINYLCHOLINE CHLORIDE 20 MG/ML IJ SOLN
INTRAMUSCULAR | Status: AC
  Filled 2018-04-19: qty 1

## 2018-04-19 MED ORDER — ROCURONIUM BROMIDE 100 MG/10ML IV SOLN
INTRAVENOUS | Status: DC | PRN
  Administered 2018-04-19: 20 mg via INTRAVENOUS

## 2018-04-19 MED ORDER — EPHEDRINE SULFATE 50 MG/ML IJ SOLN
INTRAMUSCULAR | Status: DC | PRN
  Administered 2018-04-19 (×3): 10 mg via INTRAVENOUS

## 2018-04-19 MED ORDER — ROCURONIUM BROMIDE 50 MG/5ML IV SOLN
INTRAVENOUS | Status: AC
  Filled 2018-04-19: qty 1

## 2018-04-19 MED ORDER — FENTANYL CITRATE (PF) 100 MCG/2ML IJ SOLN
INTRAMUSCULAR | Status: DC | PRN
  Administered 2018-04-19 (×2): 50 ug via INTRAVENOUS

## 2018-04-19 MED ORDER — PROPOFOL 500 MG/50ML IV EMUL
INTRAVENOUS | Status: AC
  Filled 2018-04-19: qty 50

## 2018-04-19 MED ORDER — FENTANYL CITRATE (PF) 100 MCG/2ML IJ SOLN: 25.0000 ug | INTRAMUSCULAR | Status: DC | PRN

## 2018-04-19 MED ORDER — GENTAMICIN SULFATE 40 MG/ML IJ SOLN
INTRAMUSCULAR | Status: AC
  Filled 2018-04-19: qty 2

## 2018-04-19 MED ORDER — ONDANSETRON HCL 4 MG/2ML IJ SOLN: 4.0000 mg | Freq: Once | INTRAMUSCULAR | Status: DC | PRN

## 2018-04-19 MED ORDER — LIDOCAINE HCL (CARDIAC) PF 100 MG/5ML IV SOSY
PREFILLED_SYRINGE | INTRAVENOUS | Status: DC | PRN
  Administered 2018-04-19: 100 mg via INTRAVENOUS

## 2018-04-19 MED ORDER — FENTANYL CITRATE (PF) 100 MCG/2ML IJ SOLN
INTRAMUSCULAR | Status: AC
  Filled 2018-04-19: qty 2

## 2018-04-19 MED ORDER — SODIUM CHLORIDE 0.9 % IV SOLN
2.0000 g | Freq: Three times a day (TID) | INTRAVENOUS | Status: DC
  Administered 2018-04-19 – 2018-04-22 (×8): 2 g via INTRAVENOUS
  Filled 2018-04-19 (×10): qty 2

## 2018-04-19 MED ORDER — SUCCINYLCHOLINE CHLORIDE 20 MG/ML IJ SOLN
INTRAMUSCULAR | Status: DC | PRN
  Administered 2018-04-19: 100 mg via INTRAVENOUS

## 2018-04-19 MED ORDER — SUGAMMADEX SODIUM 500 MG/5ML IV SOLN
INTRAVENOUS | Status: AC
  Filled 2018-04-19: qty 5

## 2018-04-19 MED ORDER — SODIUM CHLORIDE 0.9 % IV SOLN
INTRAVENOUS | Status: DC
  Administered 2018-04-19 – 2018-04-20 (×3): via INTRAVENOUS

## 2018-04-19 MED ORDER — GENTAMICIN SULFATE 40 MG/ML IJ SOLN
INTRAMUSCULAR | Status: AC
  Filled 2018-04-19: qty 4

## 2018-04-19 MED ORDER — BUPIVACAINE HCL 0.5 % IJ SOLN
INTRAMUSCULAR | Status: DC | PRN
  Administered 2018-04-19: 10 mL

## 2018-04-19 MED ORDER — LIDOCAINE HCL (PF) 2 % IJ SOLN
INTRAMUSCULAR | Status: AC
  Filled 2018-04-19: qty 10

## 2018-04-19 MED ORDER — VANCOMYCIN HCL 10 G IV SOLR
1250.0000 mg | Freq: Three times a day (TID) | INTRAVENOUS | Status: DC
  Administered 2018-04-19 – 2018-04-20 (×2): 1250 mg via INTRAVENOUS
  Filled 2018-04-19 (×5): qty 1250

## 2018-04-19 MED ORDER — VANCOMYCIN HCL 1000 MG IV SOLR
INTRAVENOUS | Status: AC
  Filled 2018-04-19: qty 1000

## 2018-04-19 MED ORDER — VANCOMYCIN HCL 1000 MG IV SOLR
INTRAVENOUS | Status: DC | PRN
  Administered 2018-04-19: 1000 mg

## 2018-04-19 MED ORDER — EPHEDRINE SULFATE 50 MG/ML IJ SOLN
INTRAMUSCULAR | Status: AC
  Filled 2018-04-19: qty 1

## 2018-04-19 MED ORDER — PROPOFOL 10 MG/ML IV BOLUS
INTRAVENOUS | Status: DC | PRN
  Administered 2018-04-19: 150 mg via INTRAVENOUS

## 2018-04-19 MED ORDER — ONDANSETRON HCL 4 MG/2ML IJ SOLN
INTRAMUSCULAR | Status: DC | PRN
  Administered 2018-04-19: 4 mg via INTRAVENOUS

## 2018-04-19 SURGICAL SUPPLY — 67 items
"PENCIL ELECTRO HAND CTR " (MISCELLANEOUS) ×1 IMPLANT
BANDAGE ACE 4X5 VEL STRL LF (GAUZE/BANDAGES/DRESSINGS) ×3 IMPLANT
BANDAGE ELASTIC 4 LF NS (GAUZE/BANDAGES/DRESSINGS) ×3 IMPLANT
BLADE MED AGGRESSIVE (BLADE) ×3 IMPLANT
BLADE OSC/SAGITTAL MD 5.5X18 (BLADE) ×3 IMPLANT
BLADE OSCILLATING/SAGITTAL (BLADE)
BLADE SURG 15 STRL LF DISP TIS (BLADE) ×2 IMPLANT
BLADE SURG 15 STRL SS (BLADE) ×6
BLADE SURG MINI STRL (BLADE) ×3 IMPLANT
BLADE SW THK.38XMED LNG THN (BLADE) IMPLANT
BNDG CONFORM 2 STRL LF (GAUZE/BANDAGES/DRESSINGS) ×3 IMPLANT
BNDG ESMARK 4X12 TAN STRL LF (GAUZE/BANDAGES/DRESSINGS) ×3 IMPLANT
BNDG GAUZE 4.5X4.1 6PLY STRL (MISCELLANEOUS) ×5 IMPLANT
CANISTER SUCT 1200ML W/VALVE (MISCELLANEOUS) ×3 IMPLANT
CLOSURE WOUND 1/4X4 (GAUZE/BANDAGES/DRESSINGS) ×1
COVER WAND RF STERILE (DRAPES) ×3 IMPLANT
CUFF TOURN 18 STER (MISCELLANEOUS) ×3 IMPLANT
CUFF TOURN DUAL PL 12 NO SLV (MISCELLANEOUS) ×1 IMPLANT
DRAPE FLUOR MINI C-ARM 54X84 (DRAPES) ×1 IMPLANT
DRESSING SURGICEL FIBRLLR 1X2 (HEMOSTASIS) IMPLANT
DRSG SURGICEL FIBRILLAR 1X2 (HEMOSTASIS) ×6
DURAPREP 26ML APPLICATOR (WOUND CARE) ×3 IMPLANT
ELECT REM PT RETURN 9FT ADLT (ELECTROSURGICAL) ×3
ELECTRODE REM PT RTRN 9FT ADLT (ELECTROSURGICAL) ×1 IMPLANT
GAUZE PACKING IODOFORM 1X5 (MISCELLANEOUS) ×2 IMPLANT
GAUZE PETRO XEROFOAM 1X8 (MISCELLANEOUS) ×3 IMPLANT
GAUZE SPONGE 4X4 12PLY STRL (GAUZE/BANDAGES/DRESSINGS) ×7 IMPLANT
GAUZE SPONGE 4X4 16PLY XRAY LF (GAUZE/BANDAGES/DRESSINGS) ×4 IMPLANT
GLOVE BIO SURGEON STRL SZ7.5 (GLOVE) ×9 IMPLANT
GLOVE INDICATOR 8.0 STRL GRN (GLOVE) ×7 IMPLANT
GOWN STRL REUS W/ TWL LRG LVL3 (GOWN DISPOSABLE) ×2 IMPLANT
GOWN STRL REUS W/TWL LRG LVL3 (GOWN DISPOSABLE) ×6
HANDPIECE VERSAJET DEBRIDEMENT (MISCELLANEOUS) ×3 IMPLANT
HEMOSTAT SURGICEL 2X3 (HEMOSTASIS) ×2 IMPLANT
KIT STIMULAN RAPID CURE 5CC (Orthopedic Implant) ×2 IMPLANT
KIT TURNOVER KIT A (KITS) ×3 IMPLANT
LABEL OR SOLS (LABEL) ×3 IMPLANT
NDL FILTER BLUNT 18X1 1/2 (NEEDLE) ×1 IMPLANT
NDL HYPO 25X1 1.5 SAFETY (NEEDLE) ×3 IMPLANT
NEEDLE FILTER BLUNT 18X 1/2SAF (NEEDLE) ×2
NEEDLE FILTER BLUNT 18X1 1/2 (NEEDLE) ×1 IMPLANT
NEEDLE HYPO 25X1 1.5 SAFETY (NEEDLE) ×9 IMPLANT
NS IRRIG 500ML POUR BTL (IV SOLUTION) ×3 IMPLANT
PACK EXTREMITY ARMC (MISCELLANEOUS) ×3 IMPLANT
PAD ABD DERMACEA PRESS 5X9 (GAUZE/BANDAGES/DRESSINGS) ×6 IMPLANT
PENCIL ELECTRO HAND CTR (MISCELLANEOUS) ×3 IMPLANT
RASP SM TEAR CROSS CUT (RASP) IMPLANT
SOL .9 NS 3000ML IRR  AL (IV SOLUTION) ×2
SOL .9 NS 3000ML IRR AL (IV SOLUTION) ×1
SOL .9 NS 3000ML IRR UROMATIC (IV SOLUTION) ×1 IMPLANT
SOL PREP PVP 2OZ (MISCELLANEOUS) ×3
SOLUTION PREP PVP 2OZ (MISCELLANEOUS) ×1 IMPLANT
STOCKINETTE 48X4 2 PLY STRL (GAUZE/BANDAGES/DRESSINGS) ×1 IMPLANT
STOCKINETTE STRL 4IN 9604848 (GAUZE/BANDAGES/DRESSINGS) ×3 IMPLANT
STOCKINETTE STRL 6IN 960660 (GAUZE/BANDAGES/DRESSINGS) ×3 IMPLANT
STRIP CLOSURE SKIN 1/4X4 (GAUZE/BANDAGES/DRESSINGS) ×2 IMPLANT
SUT ETHILON 3-0 FS-10 30 BLK (SUTURE) ×3
SUT ETHILON 4-0 (SUTURE)
SUT ETHILON 4-0 FS2 18XMFL BLK (SUTURE)
SUT VIC AB 3-0 SH 27 (SUTURE)
SUT VIC AB 3-0 SH 27X BRD (SUTURE) ×1 IMPLANT
SUT VIC AB 4-0 FS2 27 (SUTURE) ×1 IMPLANT
SUTURE EHLN 3-0 FS-10 30 BLK (SUTURE) ×1 IMPLANT
SUTURE ETHLN 4-0 FS2 18XMF BLK (SUTURE) ×1 IMPLANT
SWAB DUAL CULTURE TRANS RED ST (MISCELLANEOUS) ×3 IMPLANT
SYR 10ML LL (SYRINGE) ×6 IMPLANT
SYR 3ML LL SCALE MARK (SYRINGE) ×3 IMPLANT

## 2018-04-19 NOTE — Progress Notes (Signed)
[place] at [place] NAME: Victor Wise    MR#:  [id number]  DATE OF BIRTH:  [birth year]  SUBJECTIVE:  CHIEF COMPLAINT:  No chief complaint on file.  -Patient with nonhealing left big toe infection -Going for surgery today and amputation possibly  REVIEW OF SYSTEMS:  Review of Systems  Constitutional: Positive for malaise/fatigue. Negative for chills and fever.  HENT: Negative for congestion, ear discharge, hearing loss and nosebleeds.   Eyes: Negative for blurred vision and double vision.  Respiratory: Negative for cough, shortness of breath and wheezing.   Cardiovascular: Positive for leg swelling. Negative for chest pain and palpitations.  Gastrointestinal: Negative for abdominal pain, constipation, diarrhea, nausea and vomiting.  Genitourinary: Negative for dysuria.  Musculoskeletal: Positive for joint pain and myalgias.  Neurological: Negative for dizziness, focal weakness, seizures, weakness and headaches.  Psychiatric/Behavioral: Negative for depression.    DRUG ALLERGIES:   Allergies  Allergen Reactions  . Amoxicillin Other (See Comments)    Has never taken amoxicillin -ENT told him not to take (? Had skin test) Has patient had a PCN reaction causing immediate rash, facial/tongue/throat swelling, SOB or lightheadedness with hypotension: No Has patient had a PCN reaction causing severe rash involving mucus membranes or skin necrosis: No Has patient had a PCN reaction that required hospitalization No Has patient had a PCN reaction occurring within the last 10 years: No If above answers are "NO", then may proceed w/ Cephalo  . Other Anaphylaxis, Itching and Other (See Comments)    Pt states that he is allergic to Endopa.  Reaction:  Anaphylaxis  Pt states that he is allergic to Metabisulfites Reaction:  Itching   . Penicillins Other (See Comments)    Arm turned "blue" at injection site (no treatment needed) Has patient had a  PCN reaction causing immediate rash, facial/tongue/throat swelling, SOB or lightheadedness with hypotension: No Has patient had a PCN reaction causing severe rash involving mucus membranes or skin necrosis: No Has patient had a PCN reaction that required hospitalization No Has patient had a PCN reaction occurring within the last 10 years: No If all of the above answers are "NO", then may proceed with Cephalosporin use.  [name] Hydralazine Other (See Comments)    Reaction:  Cramping of extremities Pt reports tolerating low dose 12.5mg  but no higher.  . Cephalexin Itching  . Crestor [Rosuvastatin Calcium] Other (See Comments)    Reaction:  Pt is unable to move arms/legs   . Metoprolol Nausea And Vomiting  . Red Dye Itching  . Statins Other (See Comments)    Reaction:  Pt is unable to move arms/legs  . Sulfa Antibiotics Itching    Other reaction(s): Unknown  . Yellow Dyes (Non-Tartrazine) Itching  . Aspirin Itching and Other (See Comments)    Pt states that he is able to use in lower doses.    . Tape Rash    Please use "paper" tape only. Please use "paper" tape only.    VITALS:  Blood pressure (!) 165/91, pulse 63, temperature (!) 97.4 F (36.3 C), temperature source Oral, resp. rate 20, height 5\' 7"  (1.702 m), weight 133.2 kg, SpO2 96 %.  PHYSICAL EXAMINATION:  Physical Exam  GENERAL:  67 y.o.-year-old morbidly obese patient lying in the bed with no acute distress.  EYES: Pupils equal, round, reactive to light and accommodation. No scleral icterus. Extraocular muscles intact.  HEENT: Head atraumatic, normocephalic. Oropharynx and nasopharynx clear.  NECK:  Supple, no  jugular venous distention. No thyroid enlargement, no tenderness.  LUNGS: Normal breath sounds bilaterally, no wheezing, rales,rhonchi or crepitation. No use of accessory muscles of respiration.  Decreased bibasilar breath sounds CARDIOVASCULAR: S1, S2 normal. No rubs, or gallops.  3/6 systolic murmur is present ABDOMEN:  Soft, nontender, nondistended. Bowel sounds present. No organomegaly or mass.  EXTREMITIES: No cyanosis, or clubbing.  1+ pedal edema.  Left foot dressing in place NEUROLOGIC: Cranial nerves II through XII are intact. Muscle strength 5/5 in all extremities. Sensation intact. Gait not checked.  PSYCHIATRIC: The patient is alert and oriented x 3.  SKIN: No obvious rash, lesion, or ulcer.    LABORATORY PANEL:   CBC Recent Labs  Lab 04/19/18 0330  WBC 4.4  HGB 10.2*  HCT 32.5*  PLT 271   ------------------------------------------------------------------------------------------------------------------  Chemistries  Recent Labs  Lab 04/18/18 1754 04/19/18 0330  NA 136 139  K 4.1 3.8  CL 100 104  CO2 27 29  GLUCOSE 346* 301*  BUN 21 18  CREATININE 0.93 0.84  CALCIUM 8.7* 8.5*  AST 18  --   ALT 15  --   ALKPHOS 67  --   BILITOT 0.3  --    ------------------------------------------------------------------------------------------------------------------  Cardiac Enzymes No results for input(s): TROPONINI in the last 168 hours. ------------------------------------------------------------------------------------------------------------------  RADIOLOGY:  No results found.  EKG:   Orders placed or performed during the hospital encounter of 10/26/15  . EKG 12-Lead  . EKG 12-Lead  . EKG 12-Lead in am (before 8am)  . EKG 12-Lead in am (before 8am)  . EKG    ASSESSMENT AND PLAN:   67 year old male with past medical history significant for obesity, hypertension, diabetes with neuropathy, sleep apnea, history of CVA, chronic low back pain and nonhealing left toe ulcer presents to hospital secondary for further management  1.  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  Diabetic neuropathy-continue Lyrica and pain medications  3.  Diabetes mellitus-on Lantus and sliding scale insulin  4.   Hypertension-benazepril and hydrochlorothiazide        5.  DVT prophylaxis-hold today due to possible surgery  Has a wheelchair at home, ambulates with a walker -Physical therapy after his surgery                               All the records are reviewed and case discussed with Care Management/Social Workerr. Management plans discussed with the patient, family and they are in agreement.  CODE STATUS: Full code  TOTAL TIME TAKING CARE OF THIS PATIENT: 38 minutes.   POSSIBLE D/C IN 2-3 DAYS, DEPENDING ON CLINICAL CONDITION.   Mckinlee Dunk M.D on 04/19/2018 at 1:44 PM  Between 7am to 6pm - Pager - [phone]  After 6pm go to [email or web address] - password [email or web address] [facility]  Office  [phone]  CC: Primary care physician; Lavera Guise, MD

## 2018-04-19 NOTE — Consult Note (Signed)
Pharmacy Antibiotic Note  Victor Wise is a 67 y.o. male admitted on 04/18/2018 with osteomylelitis.  Pharmacy has been consulted for ceftazidime dosing. Pt has PCN allergy but has tolerated po cephalosporins  Plan: fortaz 2g q 8 hr  Height: 5\' 7"  (170.2 cm) Weight: 293 lb 9.6 oz (133.2 kg) IBW/kg (Calculated) : 66.1  Temp (24hrs), Avg:97.9 F (36.6 C), Min:97.4 F (36.3 C), Max:98.6 F (37 C)  Recent Labs  Lab 04/18/18 1754 04/19/18 0330  WBC 5.9 4.4  CREATININE 0.93 0.84    Estimated Creatinine Clearance: 113.7 mL/min (by C-G formula based on SCr of 0.84 mg/dL).    Allergies  Allergen Reactions  . Amoxicillin Other (See Comments)    Has never taken amoxicillin -ENT told him not to take (? Had skin test) Has patient had a PCN reaction causing immediate rash, facial/tongue/throat swelling, SOB or lightheadedness with hypotension: No Has patient had a PCN reaction causing severe rash involving mucus membranes or skin necrosis: No Has patient had a PCN reaction that required hospitalization No Has patient had a PCN reaction occurring within the last 10 years: No If above answers are "NO", then may proceed w/ Cephalo  . Other Anaphylaxis, Itching and Other (See Comments)    Pt states that he is allergic to Endopa.  Reaction:  Anaphylaxis  Pt states that he is allergic to Metabisulfites Reaction:  Itching   . Penicillins Other (See Comments)    Arm turned "blue" at injection site (no treatment needed) Has patient had a PCN reaction causing immediate rash, facial/tongue/throat swelling, SOB or lightheadedness with hypotension: No Has patient had a PCN reaction causing severe rash involving mucus membranes or skin necrosis: No Has patient had a PCN reaction that required hospitalization No Has patient had a PCN reaction occurring within the last 10 years: No If all of the above answers are "NO", then may proceed with Cephalosporin use.  [name] Hydralazine Other (See Comments)   Reaction:  Cramping of extremities Pt reports tolerating low dose 12.5mg  but no higher.  . Cephalexin Itching  . Crestor [Rosuvastatin Calcium] Other (See Comments)    Reaction:  Pt is unable to move arms/legs   . Metoprolol Nausea And Vomiting  . Red Dye Itching  . Statins Other (See Comments)    Reaction:  Pt is unable to move arms/legs  . Sulfa Antibiotics Itching    Other reaction(s): Unknown  . Yellow Dyes (Non-Tartrazine) Itching  . Aspirin Itching and Other (See Comments)    Pt states that he is able to use in lower doses.    . Tape Rash    Please use "paper" tape only. Please use "paper" tape only.    Antimicrobials this admission: vancomycin 12/4 >>  aztreonem 12/4 >> 12/5 Flagyl 12/4>> Ceftazidime 12/5>>  Dose adjustments this admission:   Microbiology results: Results for orders placed or performed during the hospital encounter of 04/18/18  Aerobic/Anaerobic Culture (surgical/deep wound)     Status: None (Preliminary result)   Collection Time: 04/19/18  8:28 AM  Result Value Ref Range Status   Specimen Description   Final    FOOT LEFT Performed at [facility], [address], [place], [place] [address]    Special Requests   Final    Normal Performed at [facility], [address] [place], [place] [address]    Gram Stain   Final    RARE WBC PRESENT, PREDOMINANTLY PMN RARE GRAM POSITIVE COCCI Performed at [facility],  [address] [place], [place] [address]    Culture PENDING  Incomplete   Report Status PENDING  Incomplete     Thank you for allowing pharmacy to be a part of this patient's care.  Ramond Dial, Pharm.D, BCPS Clinical Pharmacist 04/19/2018 6:50 PM

## 2018-04-19 NOTE — Clinical Social Work Note (Signed)
Clinical Social Work Assessment  Patient Details  Name: Victor Wise MRN: [id number] Date of Birth: 01/06/1951  Date of referral:  04/19/18               Reason for consult:  Facility Placement                Permission sought to share information with:    Permission granted to share information::     Name::        Agency::     Relationship::     Contact Information:     Housing/Transportation Living arrangements for the past 2 months:  Single Family Home Source of Information:  Patient Patient Interpreter Needed:  None Criminal Activity/Legal Involvement Pertinent to Current Situation/Hospitalization:  No - Comment as needed Significant Relationships:  Adult Children Lives with:  Self Do you feel safe going back to the place where you live?  Yes Need for family participation in patient care:  No (Coment)  Care giving concerns:  Patient lives alone in [place].    Social Worker assessment / plan:  Clinical Social Worker (CSW) reviewed chart and noted that patient will have a toe amputation today. CSW met with patient alone at bedside to discuss D/C plan. Patient was alert and oriented X4 and was laying in the bed. CSW introduced self and explained role of CSW department. Per patient he lives alone in [place] and has 1 adult daughter Victor Wise. Patient was recently at [facility] and prefers to go home after this hospitalization. PT is pending. Patient has his own cpap and wheel chair at home. CSW will continue to follow and assist as needed.   Employment status:  Disabled (Comment on whether or not currently receiving Disability) Insurance information:  Managed Medicare PT Recommendations:  Not assessed at this time Information / Referral to community resources:  Other (Comment Required)(Patient prefers to D/C home. )  Patient/Family's Response to care:  Patient prefers to D/C home.   Patient/Family's Understanding of and Emotional Response to Diagnosis, Current Treatment, and Prognosis:   Patient was very pleasant and thanked CSW for assistance.   Emotional Assessment Appearance:  Appears stated age Attitude/Demeanor/Rapport:    Affect (typically observed):  Accepting, Adaptable, Pleasant Orientation:  Oriented to Self, Oriented to Place, Oriented to  Time, Oriented to Situation Alcohol / Substance use:  Not Applicable Psych involvement (Current and /or in the community):  No (Comment)  Discharge Needs  Concerns to be addressed:  Discharge Planning Concerns Readmission within the last 30 days:  No Current discharge risk:  Chronically ill Barriers to Discharge:  Continued Medical Work up   ,  M, LCSW 04/19/2018, 12:18 PM  

## 2018-04-19 NOTE — Anesthesia Preprocedure Evaluation (Addendum)
Anesthesia Evaluation  Patient identified by MRN, date of birth, ID band Patient awake    Reviewed: Allergy & Precautions, NPO status , Patient's Chart, lab work & pertinent test results, reviewed documented beta blocker date and time   Airway Mallampati: III  TM Distance: >3 FB     Dental  (+) Chipped, Poor Dentition, Dental Advisory Given, Missing   Pulmonary sleep apnea and Continuous Positive Airway Pressure Ventilation , former smoker,           Cardiovascular hypertension, Pt. on medications + CAD, + Past MI, + Peripheral Vascular Disease and +CHF       Neuro/Psych PSYCHIATRIC DISORDERS Depression  Neuromuscular disease CVA    GI/Hepatic GERD  Controlled,  Endo/Other  diabetes, Type 2  Renal/GU Renal disease     Musculoskeletal   Abdominal   Peds  Hematology   Anesthesia Other Findings Pacemaker and spinal cord stimulator. Runs a low sat, usually 93-96%. Hb 10.2. EF 50-55 2 y ago.  Reproductive/Obstetrics                            Anesthesia Physical Anesthesia Plan  ASA: III  Anesthesia Plan: General   Post-op Pain Management:    Induction: Intravenous  PONV Risk Score and Plan:   Airway Management Planned: Oral ETT  Additional Equipment:   Intra-op Plan:   Post-operative Plan:   Informed Consent: I have reviewed the patients History and Physical, chart, labs and discussed the procedure including the risks, benefits and alternatives for the proposed anesthesia with the patient or authorized representative who has indicated his/her understanding and acceptance.     Plan Discussed with: CRNA  Anesthesia Plan Comments:         Anesthesia Quick Evaluation

## 2018-04-19 NOTE — Consult Note (Signed)
Pharmacy Antibiotic Note  Victor Wise is a 67 y.o. male admitted on 04/18/2018 with wound infection.  Pharmacy has been consulted for Aztreonam and Vancomycin dosing.  Plan: Aztreonam 2g q 8hr  Increase vancomycin to 1.25 gm IV Q8H - he has been therapeutic on that regimen in the recent past.   Ke = 0.099 hr-1, t 1/2 = 7 hr, Vd = 65 L  Will recheck VT prior to the 5th dose (no stacked dosing) 12/6 @ 0330 with an anticipated trough of 11  Temp (24hrs), Avg:98.1 F (36.7 C), Min:97.8 F (36.6 C), Max:98.6 F (37 C)  Recent Labs  Lab 04/18/18 1754 04/19/18 0330  WBC 5.9 4.4  CREATININE 0.93 0.84    Estimated Creatinine Clearance: 113.7 mL/min (by C-G formula based on SCr of 0.84 mg/dL).    Allergies  Allergen Reactions  . Amoxicillin Other (See Comments)    Has never taken amoxicillin -ENT told him not to take (? Had skin test) Has patient had a PCN reaction causing immediate rash, facial/tongue/throat swelling, SOB or lightheadedness with hypotension: No Has patient had a PCN reaction causing severe rash involving mucus membranes or skin necrosis: No Has patient had a PCN reaction that required hospitalization No Has patient had a PCN reaction occurring within the last 10 years: No If above answers are "NO", then may proceed w/ Cephalo  . Other Anaphylaxis, Itching and Other (See Comments)    Pt states that he is allergic to Endopa.  Reaction:  Anaphylaxis  Pt states that he is allergic to Metabisulfites Reaction:  Itching   . Penicillins Other (See Comments)    Arm turned "blue" at injection site (no treatment needed) Has patient had a PCN reaction causing immediate rash, facial/tongue/throat swelling, SOB or lightheadedness with hypotension: No Has patient had a PCN reaction causing severe rash involving mucus membranes or skin necrosis: No Has patient had a PCN reaction that required hospitalization No Has patient had a PCN reaction occurring within the last 10 years:  No If all of the above answers are "NO", then may proceed with Cephalosporin use.  [name] Hydralazine Other (See Comments)    Reaction:  Cramping of extremities Pt reports tolerating low dose 12.5mg  but no higher.  . Cephalexin Itching  . Crestor [Rosuvastatin Calcium] Other (See Comments)    Reaction:  Pt is unable to move arms/legs   . Metoprolol Nausea And Vomiting  . Red Dye Itching  . Statins Other (See Comments)    Reaction:  Pt is unable to move arms/legs  . Sulfa Antibiotics Itching    Other reaction(s): Unknown  . Yellow Dyes (Non-Tartrazine) Itching  . Aspirin Itching and Other (See Comments)    Pt states that he is able to use in lower doses.    . Tape Rash    Please use "paper" tape only. Please use "paper" tape only.    Antimicrobials this admission: Vancomycin 12/4  >>  Metronidazole 12/4 >>  Aztreonam 12/4 >>  Dose adjustments this admission: N/A  Microbiology results: None at this time  Thank you for allowing pharmacy to be a part of this patient's care.  Laural Benes, PharmD, BCPS Clinical Pharmacist 04/19/2018 8:25 AM

## 2018-04-19 NOTE — Interval H&P Note (Signed)
History and Physical Interval Note:  04/19/2018 5:24 PM  Victor Wise  has presented today for surgery, with the diagnosis of n/a  The various methods of treatment have been discussed with the patient and family. After consideration of risks, benefits and other options for treatment, the patient has consented to  Procedure(s): AMPUTATION TOE-LEFT GREAT TOE (Left) IRRIGATION AND DEBRIDEMENT FOOT (Left) as a surgical intervention .  The patient's history has been reviewed, patient examined, no change in status, stable for surgery.  I have reviewed the patient's chart and labs.  Questions were answered to the patient's satisfaction.     Durward Fortes

## 2018-04-19 NOTE — Op Note (Signed)
Date of operation: 04/19/2018.  Surgeon: Durward Fortes.  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  Postoperative diagnosis: Same.  Procedure: 1.  Amputation left great toe. 2.  Debridement bone left first metatarsal.  Anesthesia: General endotracheal.  Hemostasis: None.  Estimated blood loss: 100 cc.  Pathology: Left great toe and partial first metatarsal.  Cultures: Bone culture left first metatarsal and sesamoid.  Injectables: 10 cc 0.5% bupivacaine plain.  Implants: Stimulan rapid cure antibiotic beads impregnated with vancomycin and gentamicin.  Complications: None apparent.  Operative indications: This is a 67 year old male with nonhealing wound of his left foot status post debridement of infected bone approximately 6 weeks ago.  Patient has developed multiple allergies to antibiotics.  Decision made for readmission for infectious disease management as well as for amputation of the left great toe and debridement of the bone.  Operative procedure: Patient was taken to the operating room and placed on the table in the supine position.  Following satisfactory general anesthesia the left foot was anesthetized with 10 cc of 0.5% bupivacaine plain around the first metatarsal area.  The left foot was then prepped and draped in the usual sterile fashion.       Attention was then directed to the base of the great toe where a full-thickness continued wound was present at the first metatarsal phalangeal joint area.  An elliptical incision was made from the open wound coursing dorsal and plantar around the base of the great toe.  Incision carried sharply down to the level of the bone and periosteal dissection carried back to the level of the previously resected bone and the toe was amputated in toto.  At this point the remaining sesamoid and flexor tendons were identified and freed from the surrounding anatomy using sharp dissection.  Multiple bleeders  were encountered and cauterized.      Attention was then directed proximally where an approximate 3 cm curvilinear incision was made through the previous incision along the first metatarsal and carried sharply down to the level of the bone.  Dissection carried periosteally along the bone and then the distal aspect of the first metatarsal was transected using a sagittal saw and removed in toto along with the flexor tendon and sesamoid.  Bone culture was taken from the first metatarsal and the remaining sesamoid for sensitivities.  The wound was then thoroughly irrigated with a versa jet debrider on a setting of 5 and then irrigated on a setting of 2.  A deep bleeder was noted in the interspace area that cannot clearly be identified and this was packed with fibril are and Surgicel followed by Stimulan rapid cure antibiotic beads impregnated with vancomycin.  The incision was then closed using 3-0 nylon simple interrupted sutures and the entire wound was able to pretty much be reapproximated.  Xeroform 4 x 4's ABDs Kerlix and Ace wrap applied to the left lower extremity.  Patient was awakened and extubated and transported to the PACU having tolerated the procedure and anesthesia well.

## 2018-04-19 NOTE — Transfer of Care (Signed)
Immediate Anesthesia Transfer of Care Note  Patient: Victor Wise  Procedure(s) Performed: AMPUTATION TOE-LEFT GREAT TOE (Left Foot) IRRIGATION AND DEBRIDEMENT FOOT (Left Foot)  Patient Location: PACU  Anesthesia Type:General  Level of Consciousness: awake  Airway & Oxygen Therapy: Patient connected to face mask oxygen  Post-op Assessment: Post -op Vital signs reviewed and stable  Post vital signs: stable  Last Vitals:  Vitals Value Taken Time  BP 174/68 04/19/2018  8:49 PM  Temp    Pulse 60 04/19/2018  8:49 PM  Resp 18 04/19/2018  8:49 PM  SpO2 100 % 04/19/2018  8:49 PM  Vitals shown include unvalidated device data.  Last Pain:  Vitals:   04/19/18 1652  TempSrc: Oral  PainSc:       Patients Stated Pain Goal: 3 (80/22/33 6122)  Complications: No apparent anesthesia complications

## 2018-04-19 NOTE — Anesthesia Procedure Notes (Signed)
Procedure Name: Intubation Date/Time: 04/19/2018 7:22 PM Performed by: Aline Brochure, CRNA Pre-anesthesia Checklist: Patient identified, Emergency Drugs available, Suction available and Patient being monitored Patient Re-evaluated:Patient Re-evaluated prior to induction Oxygen Delivery Method: Circle system utilized Preoxygenation: Pre-oxygenation with 100% oxygen Induction Type: IV induction Laryngoscope Size: McGraph and 4 Grade View: Grade I Tube type: Oral Tube size: 7.0 mm Number of attempts: 1 Airway Equipment and Method: Stylet and Video-laryngoscopy Placement Confirmation: ETT inserted through vocal cords under direct vision,  positive ETCO2 and breath sounds checked- equal and bilateral Secured at: 22 cm Tube secured with: Tape Dental Injury: Teeth and Oropharynx as per pre-operative assessment  Difficulty Due To: Difficult Airway- due to dentition

## 2018-04-19 NOTE — Care Management Note (Signed)
Case Management Note  Patient Details  Name: Victor Wise MRN: [id number] Date of Birth: 1951/04/01  Subjective/Objective:                  RNCM met with a patient prior to surgery today to discuss discharge planning. He would like to return to his apartment that is handicap assessable. He said that he does depend on a wheelchair for mobility. He denies problems with transportation which his neighbor or daughter typically helps him with.  He states he has been to [facility] and did not feel he received rehab and would like to resume care with [facility].  He receives his medications through [facility].   Action/Plan:  List of home health care agencies provided 3-5 star rating per [email or web address].  Corene Cornea with [facility] aware that patient is in hospital.   Expected Discharge Date:                  Expected Discharge Plan:     In-House Referral:     Discharge planning Services  CM Consult  Post Acute Care Choice:  Resumption of Svcs/PTA Provider, Home Health Choice offered to:  Patient  DME Arranged:    DME Agency:     HH Arranged:  RN, PT, Social Work [employer] Agency:  [place]  Status of Service:  In process, will continue to follow  If discussed at Long Length of Stay Meetings, dates discussed:    Additional Comments:  Marshell Garfinkel, RN 04/19/2018, 1:29 PM

## 2018-04-19 NOTE — Consult Note (Signed)
Infectious Disease     Reason for Consult: Diabetic foot infection  Referring Physician: Claria Dice Date of Admission:  04/18/2018   Active Problems:   Diabetic ulcer of left foot [facility])   HPI: Victor Wise is a 67 y.o. male admitted with infection of L great toe. He has a  known history of obesity, hypertension, diabetes, diabetic neuropathy,, obstructive sleep apnea, depression, left foot osteomyelitis, previous CVA, chronic back pain Patient was admitted 10/17-29 with same infection and had I and D 10/21, was treated with IV then oral antibiotics.  He also had angiogram of the left foot with intervention but despite that the ulcer has not been improving.   Recently has been treated as otpt with oral abx. He tells me he developed some itching due to clindamycin and flagyl and is very allergic to amoxicillin. He was admitted  On admission wbc 5.9, afebrile. Seen by podiatry and noted to have exposed open wound with exposed bone.  Past Medical History:  Diagnosis Date  . Allergy   . Basal cell carcinoma    forehead  . BPH (benign prostatic hyperplasia)   . Chronic back pain   . Depression   . Diabetes ([place])   . GERD (gastroesophageal reflux disease)   . Heart attack ([place])   . Hypertension   . Morbid obesity ([place])   . Obstructive sleep apnea   . Osteomyelitis of foot ([place])   . Status post insertion of spinal cord stimulator   . Stroke ([place])   . UTI (lower urinary tract infection)    Past Surgical History:  Procedure Laterality Date  . CARDIAC CATHETERIZATION N/A 12/19/2014   Procedure: Coronary Stent Intervention;  Surgeon: Charolette Forward, MD;  Location: [place] CV LAB;  Service: Cardiovascular;  Laterality: N/A;  . CARDIAC CATHETERIZATION Left 12/19/2014   Procedure: Left Heart Cath and Coronary Angiography;  Surgeon: Dionisio Ulysee, MD;  Location: [place] CV LAB;  Service: Cardiovascular;  Laterality: Left;  . IRRIGATION AND DEBRIDEMENT FOOT Left 03/02/2018   Procedure:  IRRIGATION AND DEBRIDEMENT FOOT;  Surgeon: Samara Deist, DPM;  Location: [facility] ORS;  Service: Podiatry;  Laterality: Left;  . IRRIGATION AND DEBRIDEMENT FOOT Left 03/08/2018   Procedure: IRRIGATION AND DEBRIDEMENT FOOT AND BONE;  Surgeon: Sharlotte Alamo, DPM;  Location: [facility] ORS;  Service: Podiatry;  Laterality: Left;  . LOWER EXTREMITY ANGIOGRAPHY Left 03/05/2018   Procedure: Lower Extremity Angiography;  Surgeon: Algernon Huxley, MD;  Location: [place] CV LAB;  Service: Cardiovascular;  Laterality: Left;  . LOWER EXTREMITY ANGIOGRAPHY Left 03/08/2018   Procedure: LOWER EXTREMITY ANGIOGRAPHY;  Surgeon: Algernon Huxley, MD;  Location: [place] CV LAB;  Service: Cardiovascular;  Laterality: Left;  . PACEMAKER INSERTION Left 11/02/2015   Procedure: INSERTION PACEMAKER;  Surgeon: Isaias Cowman, MD;  Location: [facility] ORS;  Service: Cardiovascular;  Laterality: Left;  . Pain Stimulator    . Right toe amputation     Social History   Tobacco Use  . Smoking status: Former Smoker    Types: Cigarettes  . Smokeless tobacco: Never Used  . Tobacco comment: quit 45 years  Substance Use Topics  . Alcohol use: Not Currently    Alcohol/week: 0.0 standard drinks    Comment: have not had alcohol in 59yr  . Drug use: No   Family History  Problem Relation Age of Onset  . Breast cancer Mother   . Cancer Mother   . Hypertension Mother   . Lung cancer Father   .  Hypertension Father   . Heart disease Father   . Cancer Father   . Kidney disease Sister   . Prostate cancer Neg Hx     Allergies:  Allergies  Allergen Reactions  . Amoxicillin Other (See Comments)    Has never taken amoxicillin -ENT told him not to take (? Had skin test) Has patient had a PCN reaction causing immediate rash, facial/tongue/throat swelling, SOB or lightheadedness with hypotension: No Has patient had a PCN reaction causing severe rash involving mucus membranes or skin necrosis: No Has patient had a PCN reaction that  required hospitalization No Has patient had a PCN reaction occurring within the last 10 years: No If above answers are "NO", then may proceed w/ Cephalo  . Other Anaphylaxis, Itching and Other (See Comments)    Pt states that he is allergic to Endopa.  Reaction:  Anaphylaxis  Pt states that he is allergic to Metabisulfites Reaction:  Itching   . Penicillins Other (See Comments)    Arm turned "blue" at injection site (no treatment needed) Has patient had a PCN reaction causing immediate rash, facial/tongue/throat swelling, SOB or lightheadedness with hypotension: No Has patient had a PCN reaction causing severe rash involving mucus membranes or skin necrosis: No Has patient had a PCN reaction that required hospitalization No Has patient had a PCN reaction occurring within the last 10 years: No If all of the above answers are "NO", then may proceed with Cephalosporin use.  [name] Hydralazine Other (See Comments)    Reaction:  Cramping of extremities Pt reports tolerating low dose 12.56m but no higher.  . Cephalexin Itching  . Crestor [Rosuvastatin Calcium] Other (See Comments)    Reaction:  Pt is unable to move arms/legs   . Metoprolol Nausea And Vomiting  . Red Dye Itching  . Statins Other (See Comments)    Reaction:  Pt is unable to move arms/legs  . Sulfa Antibiotics Itching    Other reaction(s): Unknown  . Yellow Dyes (Non-Tartrazine) Itching  . Aspirin Itching and Other (See Comments)    Pt states that he is able to use in lower doses.    . Tape Rash    Please use "paper" tape only. Please use "paper" tape only.    Current antibiotics: Antibiotics Given (last 72 hours)    Date/Time Action Medication Dose Rate   04/18/18 2200 New Bag/Given   metroNIDAZOLE (FLAGYL) IVPB 500 mg 500 mg 100 mL/hr   04/18/18 2240 New Bag/Given   vancomycin (VANCOCIN) IVPB 1000 mg/200 mL premix 1,000 mg 200 mL/hr   04/18/18 2300 New Bag/Given   aztreonam (AZACTAM) 2 g in sodium chloride 0.9 % 100 mL  IVPB 2 g 200 mL/hr   04/19/18 0300 New Bag/Given   metroNIDAZOLE (FLAGYL) IVPB 500 mg 500 mg 100 mL/hr   04/19/18 0350 New Bag/Given   vancomycin (VANCOCIN) IVPB 1000 mg/200 mL premix 1,000 mg 200 mL/hr   04/19/18 0547 New Bag/Given   aztreonam (AZACTAM) 2 g in sodium chloride 0.9 % 100 mL IVPB 2 g 200 mL/hr   04/19/18 1211 New Bag/Given   vancomycin (VANCOCIN) 1,250 mg in sodium chloride 0.9 % 250 mL IVPB 1,250 mg 166.7 mL/hr      MEDICATIONS: . aspirin EC  81 mg Oral Daily  . benazepril  20 mg Oral BID  . DULoxetine  60 mg Oral Daily  . fenofibrate  54 mg Oral Q supper  . fluticasone  2 spray Each Nare Daily  . hydrochlorothiazide  12.5  mg Oral Daily  . insulin aspart  0-5 Units Subcutaneous QHS  . insulin aspart  0-9 Units Subcutaneous TID WC  . insulin glargine  60 Units Subcutaneous QHS  . omega-3 acid ethyl esters  1 g Oral BID  . OXcarbazepine  300 mg Oral q morning - 10a  . OXcarbazepine  600 mg Oral QHS  . oxyCODONE  20 mg Oral Q12H  . pantoprazole  40 mg Oral Daily  . pregabalin  200 mg Oral QHS  . pregabalin  400 mg Oral q morning - 10a  . tamsulosin  0.4 mg Oral QPC supper    Review of Systems - 11 systems reviewed and negative per HPI   OBJECTIVE: Temp:  [97.4 F (36.3 C)-98.6 F (37 C)] 97.4 F (36.3 C) (12/05 0845) Pulse Rate:  [59-63] 63 (12/05 0845) Resp:  [16-20] 20 (12/05 0845) BP: (151-165)/(65-91) 165/91 (12/05 0845) SpO2:  [91 %-96 %] 96 % (12/05 0845) Weight:  [133.2 kg] 133.2 kg (12/04 2020) Physical Exam  Constitutional: He is oriented to person, place, and time. Morbidly obese HENT: anicteric Mouth/Throat: Oropharynx is clear and moist. No oropharyngeal exudate.  Cardiovascular: Normal rate, regular rhythm and normal heart sounds. Exam reveals no gallop and no friction rub.  Pulmonary/Chest: Effort normal and breath sounds normal. No respiratory distress.  Abdominal: Soft. Bowel sounds are normal. He exhibits no distension. There is no  tenderness.  Lymphadenopathy: He has no cervical adenopathy.  Neurological: He is alert and oriented to person, place, and time.  Ext 1+ edema bil Skin: L foot with open wound over metatarsal head region with some drainage and exposed palpabale bone Psychiatric: He has a normal mood and affect. His behavior is normal.     LABS: Results for orders placed or performed during the hospital encounter of 04/18/18 (from the past 48 hour(s))  CBC     Status: Abnormal   Collection Time: 04/18/18  5:54 PM  Result Value Ref Range   WBC 5.9 4.0 - 10.5 K/uL   RBC 4.29 4.22 - 5.81 MIL/uL   Hemoglobin 10.8 (L) 13.0 - 17.0 g/dL   HCT 34.3 (L) 39.0 - 52.0 %   MCV 80.0 80.0 - 100.0 fL   MCH 25.2 (L) 26.0 - 34.0 pg   MCHC 31.5 30.0 - 36.0 g/dL   RDW 16.5 (H) 11.5 - 15.5 %   Platelets 318 150 - 400 K/uL   nRBC 0.0 0.0 - 0.2 %    Comment: Performed at [facility], [address] [place],  [address]  Comprehensive metabolic panel     Status: Abnormal   Collection Time: 04/18/18  5:54 PM  Result Value Ref Range   Sodium 136 135 - 145 mmol/L   Potassium 4.1 3.5 - 5.1 mmol/L   Chloride 100 98 - 111 mmol/L   CO2 27 22 - 32 mmol/L   Glucose, Bld 346 (H) 70 - 99 mg/dL   BUN 21 8 - 23 mg/dL   Creatinine, Ser 0.93 0.61 - 1.24 mg/dL   Calcium 8.7 (L) 8.9 - 10.3 mg/dL   Total Protein 6.7 6.5 - 8.1 g/dL   Albumin 3.5 3.5 - 5.0 g/dL   AST 18 15 - 41 U/L   ALT 15 0 - 44 U/L   Alkaline Phosphatase 67 38 - 126 U/L   Total Bilirubin 0.3 0.3 - 1.2 mg/dL   GFR calc non Af Amer >60 >60 mL/min   GFR calc Af Amer >60 >60 mL/min  Anion gap 9 5 - 15    Comment: Performed at [facility], [address] [place], [place] [address]  Glucose, capillary     Status: Abnormal   Collection Time: 04/18/18  9:17 PM  Result Value Ref Range   Glucose-Capillary 274 (H) 70 - 99 mg/dL   Comment 1 Notify RN   Basic metabolic panel     Status: Abnormal   Collection Time: 04/19/18  3:30 AM   Result Value Ref Range   Sodium 139 135 - 145 mmol/L   Potassium 3.8 3.5 - 5.1 mmol/L   Chloride 104 98 - 111 mmol/L   CO2 29 22 - 32 mmol/L   Glucose, Bld 301 (H) 70 - 99 mg/dL   BUN 18 8 - 23 mg/dL   Creatinine, Ser 0.84 0.61 - 1.24 mg/dL   Calcium 8.5 (L) 8.9 - 10.3 mg/dL   GFR calc non Af Amer >60 >60 mL/min   GFR calc Af Amer >60 >60 mL/min   Anion gap 6 5 - 15    Comment: Performed at [facility], [address] [place], [place] [address]  CBC     Status: Abnormal   Collection Time: 04/19/18  3:30 AM  Result Value Ref Range   WBC 4.4 4.0 - 10.5 K/uL   RBC 4.08 (L) 4.22 - 5.81 MIL/uL   Hemoglobin 10.2 (L) 13.0 - 17.0 g/dL   HCT 32.5 (L) 39.0 - 52.0 %   MCV 79.7 (L) 80.0 - 100.0 fL   MCH 25.0 (L) 26.0 - 34.0 pg   MCHC 31.4 30.0 - 36.0 g/dL   RDW 16.3 (H) 11.5 - 15.5 %   Platelets 271 150 - 400 K/uL   nRBC 0.0 0.0 - 0.2 %    Comment: Performed at [facility], [address] [place], [place] [address]  Aerobic/Anaerobic Culture (surgical/deep wound)     Status: None (Preliminary result)   Collection Time: 04/19/18  8:28 AM  Result Value Ref Range   Specimen Description      FOOT LEFT Performed at [facility], [address] [place], [place] [address]    Special Requests      Normal Performed at [facility], [address] [place], [place] [address]    Gram Stain      RARE WBC PRESENT, PREDOMINANTLY PMN RARE GRAM POSITIVE COCCI Performed at [facility] [address], [place], [place] [address]    Culture PENDING    Report Status PENDING   Glucose, capillary     Status: Abnormal   Collection Time: 04/19/18  8:43 AM  Result Value Ref Range   Glucose-Capillary 148 (H) 70 - 99 mg/dL  Glucose, capillary     Status: Abnormal   Collection Time: 04/19/18 11:44 AM  Result Value Ref Range   Glucose-Capillary 155 (H) 70 - 99 mg/dL   No components found for: ESR, C REACTIVE PROTEIN MICRO: Recent Results (from the  past 720 hour(s))  Aerobic/Anaerobic Culture (surgical/deep wound)     Status: None (Preliminary result)   Collection Time: 04/19/18  8:28 AM  Result Value Ref Range Status   Specimen Description   Final    FOOT LEFT Performed at [facility], [address] [place], [place] [address]    Special Requests   Final    Normal Performed at [facility], [address] [place], [place] [address]    Gram Stain   Final  RARE WBC PRESENT, PREDOMINANTLY PMN RARE GRAM POSITIVE COCCI Performed at [facility] [address] [place], [place] [address]    Culture PENDING  Incomplete   Report Status PENDING  Incomplete    IMAGING:   Assessment:   Victor Wise is a 67 y.o. male  With DM foot infection and underlying osteomyelitis. He also has multiple drug allergies. Recent admission in Oct had angio and I and D. Cultures from then grew E coli, enterococcus, Bacteroides. Was treated with IV abx while inpatient but since had surgery was felt infection was cleared and dced on oral cephalosporin.  Obviously with exposed bone has osteomyelitis and is planned for surgery today. Will likely need picc line. Currently on vanco, aztreonam and flagyl  Recommendations Change aztreonam to ceftazidime. Cont vanco pending cultures. Continue flagyl for now as well.  Thank you very much for allowing me to participate in the care of this patient. Please call with questions.   Cheral Marker. Ola Spurr, MD

## 2018-04-19 NOTE — Anesthesia Postprocedure Evaluation (Signed)
Anesthesia Post Note  Patient: Victor Wise  Procedure(s) Performed: AMPUTATION TOE-LEFT GREAT TOE (Left Foot) IRRIGATION AND DEBRIDEMENT FOOT (Left Foot)  Patient location during evaluation: PACU Anesthesia Type: General Level of consciousness: awake and alert Pain management: pain level controlled Vital Signs Assessment: post-procedure vital signs reviewed and stable Respiratory status: spontaneous breathing, nonlabored ventilation, respiratory function stable and patient connected to nasal cannula oxygen Cardiovascular status: blood pressure returned to baseline and stable Postop Assessment: no apparent nausea or vomiting Anesthetic complications: no     Last Vitals:  Vitals:   04/19/18 2114 04/19/18 2130  BP: 139/63 (!) 163/67  Pulse: 61 61  Resp: 14 18  Temp: 36.5 C (!) 36.1 C  SpO2: 99% 97%    Last Pain:  Vitals:   04/19/18 2130  TempSrc: Axillary  PainSc:                  Nikolus Marczak S

## 2018-04-19 NOTE — Progress Notes (Signed)
   Subjective/Chief Complaint: Patient seen.  Doing well.   Objective: Vital signs in last 24 hours: Temp:  [97.8 F (36.6 C)-98.6 F (37 C)] 98.6 F (37 C) (12/04 2353) Pulse Rate:  [59-60] 59 (12/04 2353) Resp:  [16] 16 (12/04 2353) BP: (151-156)/(65-66) 156/65 (12/04 2353) SpO2:  [91 %-93 %] 93 % (12/04 2353) Weight:  [133.2 kg] 133.2 kg (12/04 2020) Last BM Date: 04/18/18  Intake/Output from previous day: No intake/output data recorded. Intake/Output this shift: No intake/output data recorded.  Mild drainage noted on the bandaging.  Upon removal significant decrease in the erythema and edema.  Lab Results:  Recent Labs    04/18/18 1754 04/19/18 0330  WBC 5.9 4.4  HGB 10.8* 10.2*  HCT 34.3* 32.5*  PLT 318 271   BMET Recent Labs    04/18/18 1754 04/19/18 0330  NA 136 139  K 4.1 3.8  CL 100 104  CO2 27 29  GLUCOSE 346* 301*  BUN 21 18  CREATININE 0.93 0.84  CALCIUM 8.7* 8.5*   PT/INR No results for input(s): LABPROT, INR in the last 72 hours. ABG No results for input(s): PHART, HCO3 in the last 72 hours.  Invalid input(s): PCO2, PO2  Studies/Results: No results found.  Anti-infectives: Anti-infectives (From admission, onward)   Start     Dose/Rate Route Frequency Ordered Stop   04/18/18 2200  aztreonam (AZACTAM) 2 g in sodium chloride 0.9 % 100 mL IVPB     2 g 200 mL/hr over 30 Minutes Intravenous Every 8 hours 04/18/18 1901     04/18/18 2000  vancomycin (VANCOCIN) IVPB 1000 mg/200 mL premix     1,000 mg 200 mL/hr over 60 Minutes Intravenous Every 8 hours 04/18/18 1913     04/18/18 1900  metroNIDAZOLE (FLAGYL) IVPB 500 mg     500 mg 100 mL/hr over 60 Minutes Intravenous Every 8 hours 04/18/18 1730        Assessment/Plan: s/p * No surgery found * Assessment: 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   Plan: Culture was taken of the deep tissues for sensitivities.  Discussed with the patient amputation of the left great toe and  re-debridement of the bone.  Discussed possible risks and complications of the procedure and anesthesia including inability to heal due to his circulation or vascular status.  No guarantees could be given as to outcome.  Questions invited and answered.  N.p.o. after breakfast.  Consent for amputation left great toe with debridement bone left forefoot.  Plan for surgery this evening.  LOS: 1 day    Durward Fortes 04/19/2018

## 2018-04-19 NOTE — Anesthesia Post-op Follow-up Note (Signed)
Anesthesia QCDR form completed.        

## 2018-04-19 NOTE — H&P (View-Only) (Signed)
   Subjective/Chief Complaint: Patient seen.  Doing well.   Objective: Vital signs in last 24 hours: Temp:  [97.8 F (36.6 C)-98.6 F (37 C)] 98.6 F (37 C) (12/04 2353) Pulse Rate:  [59-60] 59 (12/04 2353) Resp:  [16] 16 (12/04 2353) BP: (151-156)/(65-66) 156/65 (12/04 2353) SpO2:  [91 %-93 %] 93 % (12/04 2353) Weight:  [133.2 kg] 133.2 kg (12/04 2020) Last BM Date: 04/18/18  Intake/Output from previous day: No intake/output data recorded. Intake/Output this shift: No intake/output data recorded.  Mild drainage noted on the bandaging.  Upon removal significant decrease in the erythema and edema.  Lab Results:  Recent Labs    04/18/18 1754 04/19/18 0330  WBC 5.9 4.4  HGB 10.8* 10.2*  HCT 34.3* 32.5*  PLT 318 271   BMET Recent Labs    04/18/18 1754 04/19/18 0330  NA 136 139  K 4.1 3.8  CL 100 104  CO2 27 29  GLUCOSE 346* 301*  BUN 21 18  CREATININE 0.93 0.84  CALCIUM 8.7* 8.5*   PT/INR No results for input(s): LABPROT, INR in the last 72 hours. ABG No results for input(s): PHART, HCO3 in the last 72 hours.  Invalid input(s): PCO2, PO2  Studies/Results: No results found.  Anti-infectives: Anti-infectives (From admission, onward)   Start     Dose/Rate Route Frequency Ordered Stop   04/18/18 2200  aztreonam (AZACTAM) 2 g in sodium chloride 0.9 % 100 mL IVPB     2 g 200 mL/hr over 30 Minutes Intravenous Every 8 hours 04/18/18 1901     04/18/18 2000  vancomycin (VANCOCIN) IVPB 1000 mg/200 mL premix     1,000 mg 200 mL/hr over 60 Minutes Intravenous Every 8 hours 04/18/18 1913     04/18/18 1900  metroNIDAZOLE (FLAGYL) IVPB 500 mg     500 mg 100 mL/hr over 60 Minutes Intravenous Every 8 hours 04/18/18 1730        Assessment/Plan: s/p * No surgery found * Assessment: 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   Plan: Culture was taken of the deep tissues for sensitivities.  Discussed with the patient amputation of the left great toe and  re-debridement of the bone.  Discussed possible risks and complications of the procedure and anesthesia including inability to heal due to his circulation or vascular status.  No guarantees could be given as to outcome.  Questions invited and answered.  N.p.o. after breakfast.  Consent for amputation left great toe with debridement bone left forefoot.  Plan for surgery this evening.  LOS: 1 day    Victor Wise 04/19/2018

## 2018-04-19 NOTE — Progress Notes (Signed)
Patient has home CPAP in room, however, does not have the correct power cord. Patient declined hospital CPAP and says he will have the proper cord brought in for tomorrow night (04/20/18). Patient resting comfortably on Room Air.

## 2018-04-19 NOTE — NC FL2 (Signed)
[place] LEVEL OF CARE SCREENING TOOL     IDENTIFICATION  Patient Name: Victor Wise Birthdate: 01/04/1951 Sex: male Admission Date (Current Location): 04/18/2018  [place] and [place] Number:  Engineering geologist and Address:  [facility], [address], [place], [place] [address]      Provider Number: [id number]  Attending Physician Name and Address:  Gladstone Lighter, MD  Relative Name and Phone Number:       Current Level of Care: Hospital Recommended Level of Care: [place] Prior Approval Number:    Date Approved/Denied:   PASRR Number: [id number])  Discharge Plan: SNF    Current Diagnoses: Patient Active Problem List   Diagnosis Date Noted  . Diabetic ulcer of left foot ([place]) 04/18/2018  . Swelling of limb 04/06/2018  . Atherosclerosis of native arteries of the extremities with ulceration ([place]) 04/06/2018  . Edema of right lower extremity 04/04/2018  . Diabetic foot infection ([place]) 03/01/2018  . Spinal cord stimulator status 01/12/2018  . Hypertension 09/05/2017  . OSA on CPAP 09/05/2017  . Uncontrolled type 2 diabetes mellitus with hyperglycemia ([place]) 08/02/2017  . Peripheral vascular disease ([place]) 08/02/2017  . Mixed hyperlipidemia 08/02/2017  . Dysuria 08/02/2017  . Obesity, Class III, BMI 40-49.9 (morbid obesity) ([place]) 08/10/2016  . Hemiparesis affecting left side as late effect of stroke ([place]) 05/09/2016  . Symptomatic bradycardia 03/21/2016  . Falls frequently 02/26/2016  . Acute renal failure (ARF) ([place]) 02/18/2016  . Hypotension 02/18/2016  . Sick sinus syndrome ([place]) 10/28/2015  . Syncopal episodes 10/26/2015  . Syncope 10/26/2015  . Adjustment disorder with mixed anxiety and depressed mood 10/20/2015  . Dysthymia 10/20/2015  . CVA (cerebral infarction) 10/19/2015  . Urinary retention 03/25/2015  . 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 CAD (coronary artery disease) 12/29/2014  . Acute  MI, anterolateral wall, subsequent episode of care ([place]) 12/19/2014  . SIRS (systemic inflammatory response syndrome) ([place]) 12/18/2014  . Systemic inflammatory response syndrome (sirs) of non-infectious origin without acute organ dysfunction ([place]) 12/18/2014  . Encounter for monitoring opioid maintenance therapy 11/10/2014  . LVH (left ventricular hypertrophy) due to hypertensive disease, with heart failure ([place]) 10/02/2014  . Benign essential hypertension 09/26/2014  . Chronic diastolic CHF (congestive heart failure), NYHA class 2 ([place]) 06/03/2014  . Pain syndrome, chronic 03/02/2014  . Diabetes ([place]) 11/08/2013  . Chronic radicular low back pain 08/21/2013  . Right shoulder pain 04/03/2013  . Urinary tract infection 03/16/2012  . Balanoposthitis 02/13/2012  . History of urinary stone 02/13/2012  . Hypertrophy of prostate with urinary obstruction and other lower urinary tract symptoms (LUTS) 02/13/2012  . 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 Redundant prepuce and phimosis 02/13/2012  . Urge incontinence 02/13/2012  . Right foot pain 01/18/2012  . Chronic, continuous use of opioids 08/22/2011    Orientation RESPIRATION BLADDER Height & Weight     Self, Time, Situation, Place  Normal Continent Weight: 293 lb 9.6 oz (133.2 kg) Height:  5\' 7"  (170.2 cm)  BEHAVIORAL SYMPTOMS/MOOD NEUROLOGICAL BOWEL NUTRITION STATUS      Incontinent Diet(NPO for surgery to be advanced. )  AMBULATORY STATUS COMMUNICATION OF NEEDS Skin   Extensive Assist Verbally Surgical wounds                       Personal Care Assistance Level of Assistance  Bathing, Feeding, Dressing Bathing Assistance: Limited assistance Feeding assistance: Independent Dressing Assistance: Limited assistance     Functional  Limitations Info  Sight, Hearing, Speech Sight Info: Adequate Hearing Info: Adequate Speech Info: Adequate    SPECIAL CARE FACTORS FREQUENCY  PT (By licensed PT), OT (By licensed OT)     PT  Frequency: (5) OT Frequency: (5)            Contractures      Additional Factors Info  Code Status, Allergies Code Status Info: (Full Code. ) Allergies Info: (Amoxicillin, Other, Penicillins, Hydralazine, Cephalexin, Crestor Rosuvastatin Calcium, Metoprolol, Red Dye, Statins, Sulfa Antibiotics, Yellow Dyes (Non-tartrazine), Aspirin, Tape)           Current Medications (04/19/2018):  This is the current hospital active medication list Current Facility-Administered Medications  Medication Dose Route Frequency Provider Last Rate Last Dose  . acetaminophen (TYLENOL) tablet 650 mg  650 mg Oral Q6H PRN Henreitta Leber, MD       Or  . acetaminophen (TYLENOL) suppository 650 mg  650 mg Rectal Q6H PRN Henreitta Leber, MD      . aspirin EC tablet 81 mg  81 mg Oral Daily Sainani, Vivek J, MD      . aztreonam (AZACTAM) 2 g in sodium chloride 0.9 % 100 mL IVPB  2 g Intravenous Q8H Lu Duffel, RPH 200 mL/hr at 04/19/18 0547 2 g at 04/19/18 0547  . benazepril (LOTENSIN) tablet 20 mg  20 mg Oral BID Henreitta Leber, MD   20 mg at 04/18/18 2332  . DULoxetine (CYMBALTA) DR capsule 60 mg  60 mg Oral Daily Henreitta Leber, MD   60 mg at 04/18/18 2335  . fenofibrate tablet 54 mg  54 mg Oral Q supper Henreitta Leber, MD   54 mg at 04/18/18 2329  . fluticasone (FLONASE) 50 MCG/ACT nasal spray 2 spray  2 spray Each Nare Daily Sainani, Vivek J, MD      . hydrochlorothiazide (HYDRODIURIL) tablet 12.5 mg  12.5 mg Oral Daily Sainani, Vivek J, MD      . insulin aspart (novoLOG) injection 0-5 Units  0-5 Units Subcutaneous QHS Henreitta Leber, MD   3 Units at 04/18/18 2240  . insulin aspart (novoLOG) injection 0-9 Units  0-9 Units Subcutaneous TID WC Henreitta Leber, MD   2 Units at 04/19/18 1206  . insulin glargine (LANTUS) injection 60 Units  60 Units Subcutaneous QHS Henreitta Leber, MD   60 Units at 04/18/18 2240  . methocarbamol (ROBAXIN) tablet 500 mg  500 mg Oral Q8H PRN Henreitta Leber,  MD   500 mg at 04/18/18 2332  . metroNIDAZOLE (FLAGYL) IVPB 500 mg  500 mg Intravenous Q8H Sainani, Vivek J, MD 100 mL/hr at 04/19/18 0300 500 mg at 04/19/18 0300  . nitroGLYCERIN (NITROSTAT) SL tablet 0.4 mg  0.4 mg Sublingual Q5 Min x 3 PRN Henreitta Leber, MD      . omega-3 acid ethyl esters (LOVAZA) capsule 1 g  1 g Oral BID Henreitta Leber, MD   1 g at 04/18/18 2331  . ondansetron (ZOFRAN) tablet 4 mg  4 mg Oral Q6H PRN Henreitta Leber, MD       Or  . ondansetron (ZOFRAN) injection 4 mg  4 mg Intravenous Q6H PRN Henreitta Leber, MD      . Oxcarbazepine (TRILEPTAL) tablet 300 mg  300 mg Oral q morning - 10a Cyndee Brightly [place], [facility]      . Oxcarbazepine (TRILEPTAL) tablet 600 mg  600 mg Oral QHS Cyndee Brightly [place], [place]  600 mg at 04/18/18 2329  . oxyCODONE (Oxy IR/ROXICODONE) immediate release tablet 5 mg  5 mg Oral Q4H PRN Henreitta Leber, MD      . oxyCODONE (OXYCONTIN) 12 hr tablet 20 mg  20 mg Oral Q12H Henreitta Leber, MD   20 mg at 04/19/18 0957  . pantoprazole (PROTONIX) EC tablet 40 mg  40 mg Oral Daily Henreitta Leber, MD   40 mg at 04/19/18 0956  . polyethylene glycol (MIRALAX / GLYCOLAX) packet 17 g  17 g Oral Daily PRN Henreitta Leber, MD      . pregabalin (LYRICA) capsule 200 mg  200 mg Oral QHS Cyndee Brightly M, RPH   200 mg at 04/18/18 2326  . pregabalin (LYRICA) capsule 400 mg  400 mg Oral q morning - 10a Cyndee Brightly M, [facility] [place]      . tamsulosin (FLOMAX) capsule 0.4 mg  0.4 mg Oral QPC supper Henreitta Leber, MD      . vancomycin (VANCOCIN) 1,250 mg in sodium chloride 0.9 % 250 mL IVPB  1,250 mg Intravenous Mee Hives, MD 166.7 mL/hr at 04/19/18 1211 1,250 mg at 04/19/18 1211     Discharge Medications: Please see discharge summary for a list of discharge medications.  Relevant Imaging Results:  Relevant Lab Results:   Additional Information (SSN: [id number])  Kery Batzel, Veronia Beets, LCSW

## 2018-04-20 ENCOUNTER — Encounter (INDEPENDENT_AMBULATORY_CARE_PROVIDER_SITE_OTHER): Payer: Self-pay

## 2018-04-20 ENCOUNTER — Encounter: Payer: Self-pay | Admitting: Podiatry

## 2018-04-20 ENCOUNTER — Inpatient Hospital Stay: Payer: Self-pay

## 2018-04-20 LAB — CBC
HCT: 31.6 % — ABNORMAL LOW (ref 39.0–52.0)
HEMOGLOBIN: 9.7 g/dL — AB (ref 13.0–17.0)
MCH: 24.9 pg — ABNORMAL LOW (ref 26.0–34.0)
MCHC: 30.7 g/dL (ref 30.0–36.0)
MCV: 81 fL (ref 80.0–100.0)
Platelets: 255 10*3/uL (ref 150–400)
RBC: 3.9 MIL/uL — ABNORMAL LOW (ref 4.22–5.81)
RDW: 16.3 % — ABNORMAL HIGH (ref 11.5–15.5)
WBC: 5 10*3/uL (ref 4.0–10.5)
nRBC: 0 % (ref 0.0–0.2)

## 2018-04-20 LAB — BASIC METABOLIC PANEL
ANION GAP: 3 — AB (ref 5–15)
BUN: 16 mg/dL (ref 8–23)
CO2: 28 mmol/L (ref 22–32)
Calcium: 7.8 mg/dL — ABNORMAL LOW (ref 8.9–10.3)
Chloride: 107 mmol/L (ref 98–111)
Creatinine, Ser: 0.83 mg/dL (ref 0.61–1.24)
GFR calc Af Amer: >60 mL/min (ref >60)
GFR calc non Af Amer: >60 mL/min (ref >60)
Glucose, Bld: 244 mg/dL — ABNORMAL HIGH (ref 70–99)
Potassium: 3.8 mmol/L (ref 3.5–5.1)
Sodium: 138 mmol/L (ref 135–145)

## 2018-04-20 LAB — GLUCOSE, CAPILLARY
GLUCOSE-CAPILLARY: 278 mg/dL — AB (ref 70–99)
Glucose-Capillary: 224 mg/dL — ABNORMAL HIGH (ref 70–99)
Glucose-Capillary: 273 mg/dL — ABNORMAL HIGH (ref 70–99)
Glucose-Capillary: 275 mg/dL — ABNORMAL HIGH (ref 70–99)

## 2018-04-20 MED ORDER — SODIUM CHLORIDE 0.9% FLUSH: 10.0000 mL | INTRAVENOUS | Status: DC | PRN

## 2018-04-20 MED ORDER — SODIUM CHLORIDE 0.9% FLUSH
10.0000 mL | Freq: Two times a day (BID) | INTRAVENOUS | Status: DC
  Administered 2018-04-20 – 2018-04-23 (×5): 10 mL

## 2018-04-20 MED ORDER — VANCOMYCIN HCL 10 G IV SOLR
1500.0000 mg | Freq: Two times a day (BID) | INTRAVENOUS | Status: DC
  Administered 2018-04-20 – 2018-04-22 (×4): 1500 mg via INTRAVENOUS
  Filled 2018-04-20 (×5): qty 1500

## 2018-04-20 MED ORDER — HEPARIN SODIUM (PORCINE) 5000 UNIT/ML IJ SOLN
5000.0000 [IU] | Freq: Three times a day (TID) | INTRAMUSCULAR | Status: DC
  Administered 2018-04-20 – 2018-04-23 (×10): 5000 [IU] via SUBCUTANEOUS
  Filled 2018-04-20 (×10): qty 1

## 2018-04-20 NOTE — Progress Notes (Signed)
1 Day Post-Op   Subjective/Chief Complaint: Patient seen.  Had some significant increase in pain this time as opposed to his last surgery.  Did not sleep last night.   Objective: Vital signs in last 24 hours: Temp:  [97 F (36.1 C)-99 F (37.2 C)] 97.7 F (36.5 C) (12/06 0757) Pulse Rate:  [60-63] 63 (12/06 0757) Resp:  [14-18] 18 (12/06 0757) BP: (139-175)/(63-82) 175/82 (12/06 0757) SpO2:  [93 %-100 %] 98 % (12/06 0757) Last BM Date: 04/19/18  Intake/Output from previous day: 12/05 0701 - 12/06 0700 In: 2928.4 [I.V.:424.4; IV Piggyback:2504] Out: 826 [Urine:725; Stool:1; Blood:100] Intake/Output this shift: No intake/output data recorded.  The bandage is dry and intact.  Upon removal there is some moderate strikethrough on the bandaging.  Upon removal the incisions appear well coapted with only mild erythema and edema.  No active bleeding or drainage.  Lab Results:  Recent Labs    04/19/18 0330 04/20/18 0426  WBC 4.4 5.0  HGB 10.2* 9.7*  HCT 32.5* 31.6*  PLT 271 255   BMET Recent Labs    04/19/18 0330 04/20/18 0426  NA 139 138  K 3.8 3.8  CL 104 107  CO2 29 28  GLUCOSE 301* 244*  BUN 18 16  CREATININE 0.84 0.83  CALCIUM 8.5* 7.8*   PT/INR No results for input(s): LABPROT, INR in the last 72 hours. ABG No results for input(s): PHART, HCO3 in the last 72 hours.  Invalid input(s): PCO2, PO2  Studies/Results: [place] Ekg Site Rite  Result Date: 04/20/2018 If Site Rite image not attached, placement could not be confirmed due to current cardiac rhythm.   Anti-infectives: Anti-infectives (From admission, onward)   Start     Dose/Rate Route Frequency Ordered Stop   04/20/18 1800  vancomycin (VANCOCIN) 1,500 mg in sodium chloride 0.9 % 500 mL IVPB     1,500 mg 250 mL/hr over 120 Minutes Intravenous Every 12 hours 04/20/18 0822     04/19/18 2200  cefTAZidime (FORTAZ) 2 g in sodium chloride 0.9 % 100 mL IVPB     2 g 200 mL/hr over 30 Minutes Intravenous Every  8 hours 04/19/18 1850     04/19/18 1953  vancomycin (VANCOCIN) powder  Status:  Discontinued       As needed 04/19/18 1954 04/19/18 2045   04/19/18 1200  vancomycin (VANCOCIN) 1,250 mg in sodium chloride 0.9 % 250 mL IVPB  Status:  Discontinued     1,250 mg 166.7 mL/hr over 90 Minutes Intravenous Every 8 hours 04/19/18 0825 04/20/18 0822   04/18/18 2200  aztreonam (AZACTAM) 2 g in sodium chloride 0.9 % 100 mL IVPB  Status:  Discontinued     2 g 200 mL/hr over 30 Minutes Intravenous Every 8 hours 04/18/18 1901 04/19/18 1805   04/18/18 2000  vancomycin (VANCOCIN) IVPB 1000 mg/200 mL premix  Status:  Discontinued     1,000 mg 200 mL/hr over 60 Minutes Intravenous Every 8 hours 04/18/18 1913 04/19/18 0825   04/18/18 1900  metroNIDAZOLE (FLAGYL) IVPB 500 mg     500 mg 100 mL/hr over 60 Minutes Intravenous Every 8 hours 04/18/18 1730        Assessment/Plan: s/p Procedure(s): AMPUTATION TOE-LEFT GREAT TOE (Left) IRRIGATION AND DEBRIDEMENT FOOT (Left) Assessment: Stable status post amputation and I&D left foot.   Plan: Bulky sterile dry dressing reapplied to the foot.  May begin bathroom privileges with assistance and pressure only on the heel.  Due to his need for IV antibiotics and  wound care patient may be best served to the transferred to skilled nursing for 3 weeks.  At this point I will plan to reassess the wound on Sunday.  LOS: 2 days    Durward Fortes 04/20/2018

## 2018-04-20 NOTE — Progress Notes (Signed)
Inpatient Diabetes Program Recommendations  AACE/ADA: New Consensus Statement on Inpatient Glycemic Control (2019)  Target Ranges:  Prepandial:   less than 140 mg/dL      Peak postprandial:   less than 180 mg/dL (1-2 hours)      Critically ill patients:  140 - 180 mg/dL   Results for TRUST, LEH (MRN [id number]) as of 04/20/2018 12:42  Ref. Range 04/18/2018 21:17 04/19/2018 08:43 04/19/2018 11:44 04/19/2018 16:51 04/19/2018 20:56 04/20/2018 07:43 04/20/2018 12:33  Glucose-Capillary Latest Ref Range: 70 - 99 mg/dL 274 (H) 148 (H) 155 (H) 98 122 (H) 224 (H) 273 (H)   Review of Glycemic Control  Diabetes history: DM2 Outpatient Diabetes medications: Lantus 60 units QHS, Regular TID with meals (per sliding scale; max of 45 units per day), Bydureon 2 mg Qweek (Wednesday) Current orders for Inpatient glycemic control: Lantus 60 units QHS, Novolog 0-9 units TID with meals, Novolog 0-5 units QHS  Inpatient Diabetes Program Recommendations:  Insulin - Basal: Please consider increasing Lantus to 63 units QHS. Insulin - Meal Coverage: Please consider ordering Novolog 4 units TID with meals for meal coverage if patient eats at least 50% of meals.  Thanks, Barnie Alderman, RN, MSN, CDE Diabetes Coordinator Inpatient Diabetes Program [phone] (Team Pager from 8am to 5pm)

## 2018-04-20 NOTE — Progress Notes (Signed)
Patient has Home CPAP at bedside. RT reviewed CPAP and it appears to be in good working condition. No loose or frayed wires observed. Home CPAP plugged into red outlet. Patient stated he would self place CPAP on hisself when ready for bed. RT assistance not needed.

## 2018-04-20 NOTE — Progress Notes (Signed)
[place] INFECTIOUS DISEASE PROGRESS NOTE Date of Admission:  04/18/2018     ID: Victor Wise is a 67 y.o. male with diabetic foot infection Active Problems:   Diabetic ulcer of left foot ([place])   Subjective: Had surg yest with amputation of L great toe and debridement of L first MT bone  ROS  Eleven systems are reviewed and negative except per hpi  Medications:  Antibiotics Given (last 72 hours)    Date/Time Action Medication Dose Rate   04/18/18 2200 New Bag/Given   metroNIDAZOLE (FLAGYL) IVPB 500 mg 500 mg 100 mL/hr   04/18/18 2240 New Bag/Given   vancomycin (VANCOCIN) IVPB 1000 mg/200 mL premix 1,000 mg 200 mL/hr   04/18/18 2300 New Bag/Given   aztreonam (AZACTAM) 2 g in sodium chloride 0.9 % 100 mL IVPB 2 g 200 mL/hr   04/19/18 0300 New Bag/Given   metroNIDAZOLE (FLAGYL) IVPB 500 mg 500 mg 100 mL/hr   04/19/18 0350 New Bag/Given   vancomycin (VANCOCIN) IVPB 1000 mg/200 mL premix 1,000 mg 200 mL/hr   04/19/18 0547 New Bag/Given   aztreonam (AZACTAM) 2 g in sodium chloride 0.9 % 100 mL IVPB 2 g 200 mL/hr   04/19/18 1211 New Bag/Given   vancomycin (VANCOCIN) 1,250 mg in sodium chloride 0.9 % 250 mL IVPB 1,250 mg 166.7 mL/hr   04/19/18 1550 New Bag/Given   metroNIDAZOLE (FLAGYL) IVPB 500 mg 500 mg 100 mL/hr   04/19/18 1742 New Bag/Given   aztreonam (AZACTAM) 2 g in sodium chloride 0.9 % 100 mL IVPB 2 g 200 mL/hr   04/19/18 1953 Given  [mixed with 240mg  gentamicin for antibiotic beads]   vancomycin (VANCOCIN) powder 1,000 mg    04/19/18 2222 New Bag/Given   cefTAZidime (FORTAZ) 2 g in sodium chloride 0.9 % 100 mL IVPB 2 g 200 mL/hr   04/20/18 0324 New Bag/Given   metroNIDAZOLE (FLAGYL) IVPB 500 mg 500 mg 100 mL/hr   04/20/18 0458 New Bag/Given   vancomycin (VANCOCIN) 1,250 mg in sodium chloride 0.9 % 250 mL IVPB 1,250 mg 166.7 mL/hr   04/20/18 0655 New Bag/Given   cefTAZidime (FORTAZ) 2 g in sodium chloride 0.9 % 100 mL IVPB 2 g 200 mL/hr     . aspirin EC  81 mg  Oral Daily  . benazepril  20 mg Oral BID  . DULoxetine  60 mg Oral Daily  . fenofibrate  54 mg Oral Q supper  . fluticasone  2 spray Each Nare Daily  . hydrochlorothiazide  12.5 mg Oral Daily  . insulin aspart  0-5 Units Subcutaneous QHS  . insulin aspart  0-9 Units Subcutaneous TID WC  . insulin glargine  60 Units Subcutaneous QHS  . omega-3 acid ethyl esters  1 g Oral BID  . OXcarbazepine  300 mg Oral q morning - 10a  . OXcarbazepine  600 mg Oral QHS  . oxyCODONE  20 mg Oral Q12H  . pantoprazole  40 mg Oral Daily  . pregabalin  200 mg Oral QHS  . pregabalin  400 mg Oral q morning - 10a  . tamsulosin  0.4 mg Oral QPC supper    Objective: Vital signs in last 24 hours: Temp:  [97 F (36.1 C)-99 F (37.2 C)] 97.7 F (36.5 C) (12/06 0757) Pulse Rate:  [60-63] 63 (12/06 0757) Resp:  [14-18] 18 (12/06 0757) BP: (139-175)/(63-82) 175/82 (12/06 0757) SpO2:  [93 %-100 %] 98 % (12/06 0757) Constitutional: He is oriented to person, place, and time. Morbidly obese  HENT: anicteric Mouth/Throat: Oropharynx is clear and moist. No oropharyngeal exudate.  Cardiovascular: Normal rate, regular rhythm and normal heart sounds. Exam reveals no gallop and no friction rub.  Pulmonary/Chest: Effort normal and breath sounds normal. No respiratory distress.  Abdominal: Soft. Bowel sounds are normal. He exhibits no distension. There is no tenderness.  Lymphadenopathy: He has no cervical adenopathy.  Neurological: He is alert and oriented to person, place, and time.  Ext 1+ edema bil Skin: L foot wrapped post op Psychiatric: He has a normal mood and affect. His behavior is normal.   Lab Results Recent Labs    04/19/18 0330 04/20/18 0426  WBC 4.4 5.0  HGB 10.2* 9.7*  HCT 32.5* 31.6*  NA 139 138  K 3.8 3.8  CL 104 107  CO2 29 28  BUN 18 16  CREATININE 0.84 0.83    Microbiology: Results for orders placed or performed during the hospital encounter of 04/18/18  Aerobic/Anaerobic Culture  (surgical/deep wound)     Status: None (Preliminary result)   Collection Time: 04/19/18  8:28 AM  Result Value Ref Range Status   Specimen Description   Final    FOOT LEFT Performed at [facility], [address] [place], [place] [address]    Special Requests   Final    Normal Performed at [facility], [address] [place], [place] [address]    Gram Stain   Final    RARE WBC PRESENT, PREDOMINANTLY PMN RARE GRAM POSITIVE COCCI    Culture   Final    CULTURE REINCUBATED FOR BETTER GROWTH Performed at [facility], [place] [address], [place], [place] [address]    Report Status PENDING  Incomplete  Aerobic/Anaerobic Culture (surgical/deep wound)     Status: None (Preliminary result)   Collection Time: 04/19/18  7:58 PM  Result Value Ref Range Status   Specimen Description   Final    BONE BX Performed at [facility], [address] [place], [place] [address]    Special Requests   Final    NONE Performed at [facility], [place],  [address]    Gram Stain   Final    RARE WBC PRESENT, PREDOMINANTLY MONONUCLEAR RARE GRAM POSITIVE COCCI RARE GRAM NEGATIVE RODS    Culture   Final    TOO YOUNG TO READ Performed at [facility], [place] [address], [place],  [address]    Report Status PENDING  Incomplete    Studies/Results: No results found.  Assessment/Plan: Victor Wise is a 67 y.o. male with DM foot infection and underlying osteomyelitis. He also has multiple drug allergies. Recent admission in Oct had angio and I and D. Cultures from then grew E coli, enterococcus, Bacteroides. Was treated with IV abx while inpatient but since had surgery was felt infection was cleared and dced on oral cephalosporin.  Obviously with exposed bone has osteomyelitis and is planned for surgery today. Will likely need picc line. Currently on vanco, aztreonam and flagyl  Recommendations Cont ceftazidime. Cont  vanco pending cultures. Continue flagyl for now as well. Will need PICC Thank you very much for the consult. Will follow with you.  JOHNROBERT FOTI   04/20/2018, 10:33 AM

## 2018-04-20 NOTE — Progress Notes (Signed)
[facility] NAME: Victor Wise    MR#:  [id number]  DATE OF BIRTH:  10/22/1950  SUBJECTIVE:  CHIEF COMPLAINT:  No chief complaint on file.  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 -Complains of weakness  REVIEW OF SYSTEMS:  Review of Systems  Constitutional: Positive for malaise/fatigue. Negative for chills and fever.  HENT: Negative for congestion, ear discharge, hearing loss and nosebleeds.   Eyes: Negative for blurred vision and double vision.  Respiratory: Negative for cough, shortness of breath and wheezing.   Cardiovascular: Positive for leg swelling. Negative for chest pain and palpitations.  Gastrointestinal: Negative for abdominal pain, constipation, diarrhea, nausea and vomiting.  Genitourinary: Negative for dysuria.  Musculoskeletal: Positive for joint pain and myalgias.  Neurological: Negative for dizziness, focal weakness, seizures, weakness and headaches.  Psychiatric/Behavioral: Negative for depression.    DRUG ALLERGIES:   Allergies  Allergen Reactions  . Amoxicillin Other (See Comments)    Has never taken amoxicillin -ENT told him not to take (? Had skin test) Has patient had a PCN reaction causing immediate rash, facial/tongue/throat swelling, SOB or lightheadedness with hypotension: No Has patient had a PCN reaction causing severe rash involving mucus membranes or skin necrosis: No Has patient had a PCN reaction that required hospitalization No Has patient had a PCN reaction occurring within the last 10 years: No If above answers are "NO", then may proceed w/ Cephalo  . Other Anaphylaxis, Itching and Other (See Comments)    Pt states that he is allergic to Endopa.  Reaction:  Anaphylaxis  Pt states that he is allergic to Metabisulfites Reaction:  Itching   . Penicillins Other (See Comments)    Arm turned "blue" at injection site (no  treatment needed) Has patient had a PCN reaction causing immediate rash, facial/tongue/throat swelling, SOB or lightheadedness with hypotension: No Has patient had a PCN reaction causing severe rash involving mucus membranes or skin necrosis: No Has patient had a PCN reaction that required hospitalization No Has patient had a PCN reaction occurring within the last 10 years: No If all of the above answers are "NO", then may proceed with Cephalosporin use.  [name] Hydralazine Other (See Comments)    Reaction:  Cramping of extremities Pt reports tolerating low dose 12.5mg  but no higher.  . Cephalexin Itching  . Crestor [Rosuvastatin Calcium] Other (See Comments)    Reaction:  Pt is unable to move arms/legs   . Metoprolol Nausea And Vomiting  . Red Dye Itching  . Statins Other (See Comments)    Reaction:  Pt is unable to move arms/legs  . Sulfa Antibiotics Itching    Other reaction(s): Unknown  . Yellow Dyes (Non-Tartrazine) Itching  . Aspirin Itching and Other (See Comments)    Pt states that he is able to use in lower doses.    . Tape Rash    Please use "paper" tape only. Please use "paper" tape only.    VITALS:  Blood pressure (!) 175/82, pulse 63, temperature 97.7 F (36.5 C), temperature source Oral, resp. rate 18, height 5\' 7"  (1.702 m), weight 133.2 kg, SpO2 98 %.  PHYSICAL EXAMINATION:  Physical Exam  GENERAL:  67 y.o.-year-old morbidly obese patient lying in the bed with no acute distress.  EYES: Pupils equal, round, reactive to light and accommodation. No scleral icterus. Extraocular muscles intact.  HEENT: Head atraumatic, normocephalic. Oropharynx and  nasopharynx clear.  NECK:  Supple, no jugular venous distention. No thyroid enlargement, no tenderness.  LUNGS: Normal breath sounds bilaterally, no wheezing, rales,rhonchi or crepitation. No use of accessory muscles of respiration.  Decreased bibasilar breath sounds CARDIOVASCULAR: S1, S2 normal. No rubs, or gallops.  3/6  systolic murmur is present ABDOMEN: Soft, nontender, nondistended. Bowel sounds present. No organomegaly or mass.  EXTREMITIES: No cyanosis, or clubbing.  1+ pedal edema.  Left foot dressing in place NEUROLOGIC: Cranial nerves II through XII are intact. Muscle strength 5/5 in all extremities. Sensation intact. Gait not checked.  PSYCHIATRIC: The patient is alert and oriented x 3.  SKIN: No obvious rash, lesion, or ulcer.    LABORATORY PANEL:   CBC Recent Labs  Lab 04/20/18 0426  WBC 5.0  HGB 9.7*  HCT 31.6*  PLT 255   ------------------------------------------------------------------------------------------------------------------  Chemistries  Recent Labs  Lab 04/18/18 1754  04/20/18 0426  NA 136   < > 138  K 4.1   < > 3.8  CL 100   < > 107  CO2 27   < > 28  GLUCOSE 346*   < > 244*  BUN 21   < > 16  CREATININE 0.93   < > 0.83  CALCIUM 8.7*   < > 7.8*  AST 18  --   --   ALT 15  --   --   ALKPHOS 67  --   --   BILITOT 0.3  --   --    < > = values in this interval not displayed.   ------------------------------------------------------------------------------------------------------------------  Cardiac Enzymes No results for input(s): TROPONINI in the last 168 hours. ------------------------------------------------------------------------------------------------------------------  RADIOLOGY:  [place] Ekg Site Rite  Result Date: 04/20/2018 If Site Rite image not attached, placement could not be confirmed due to current cardiac rhythm.   EKG:   Orders placed or performed during the hospital encounter of 10/26/15  . EKG 12-Lead  . EKG 12-Lead  . EKG 12-Lead in am (before 8am)  . EKG 12-Lead in am (before 8am)  . EKG    ASSESSMENT AND PLAN:   67 year old male with past medical history significant for obesity, hypertension, diabetes with neuropathy, sleep apnea, history of CVA, chronic low back pain and nonhealing left toe ulcer presents to hospital secondary for  further management  1.  Cellulitis and osteomyelitis of left forefoot-nonhealing diabetic foot ulcer -Appreciate podiatry consult -Patient that is post debridement and left foot first toe amputation. -PICC line placed.  Appreciate ID and podiatry consults. -Currently on IV antibiotics with vancomycin, Fortaz and Flagyl. -Await cultures. -Physical therapy consulted  2.  Diabetic neuropathy-continue Lyrica and pain medications  3.  Diabetes mellitus-on Lantus and sliding scale insulin  4.   Hypertension-benazepril and hydrochlorothiazide        5.  DVT prophylaxis-can start subcu heparin today  Has a wheelchair at home, ambulates with a walker -Physical therapy today and weightbearing per podiatry                            All the records are reviewed and case discussed with Care Management/Social Workerr. Management plans discussed with the patient, family and they are in agreement.  CODE STATUS: Full code  TOTAL TIME TAKING CARE OF THIS PATIENT: 38 minutes.   POSSIBLE D/C IN 2-3 DAYS, DEPENDING ON CLINICAL CONDITION.   Gladstone Lighter M.D on 04/20/2018 at 1:46 PM  Between 7am to 6pm - Pager - [phone]  After 6pm go to [email or web address] - password [email or web address] [facility]  Office  [phone]  CC: Primary care physician; Lavera Guise, MD

## 2018-04-20 NOTE — Progress Notes (Signed)
PT is recommending SNF and a bedside commode if patient goes home. Clinical Social Worker (CSW) met with patient and made him aware of above. Patient adamantly refused SNF and stated that he feels comfortable going home with a PICC line. Per patient he had home IV ABX in the past and was able to administer the IV ABX himself. Patient also refused a bedside commode and stated that he only has 2 steps to get into his bathroom from his bed. CSW sent RN case manager a message making her aware of above.   [employer], LCSW [phone]

## 2018-04-20 NOTE — Progress Notes (Signed)
Peripherally Inserted Central Catheter/Midline Placement  The IV Nurse has discussed with the patient and/or persons authorized to consent for the patient, the purpose of this procedure and the potential benefits and risks involved with this procedure.  The benefits include less needle sticks, lab draws from the catheter, and the patient may be discharged home with the catheter. Risks include, but not limited to, infection, bleeding, blood clot (thrombus formation), and puncture of an artery; nerve damage and irregular heartbeat and possibility to perform a PICC exchange if needed/ordered by physician.  Alternatives to this procedure were also discussed.  Bard Power PICC patient education guide, fact sheet on infection prevention and patient information card has been provided to patient /or left at bedside.    PICC/Midline Placement Documentation  PICC Single Lumen 04/20/18 PICC Right Cephalic 44 cm 0 cm (Active)  Indication for Insertion or Continuance of Line Prolonged intravenous therapies 04/20/2018 12:27 PM  Exposed Catheter (cm) 0 cm 04/20/2018 12:27 PM  Site Assessment Clean;Dry;Intact 04/20/2018 12:27 PM  Line Status Flushed;Blood return noted 04/20/2018 12:27 PM  Dressing Type Transparent 04/20/2018 12:27 PM  Dressing Status Clean;Dry;Intact;Antimicrobial disc in place 04/20/2018 12:27 PM  Dressing Intervention New dressing 04/20/2018 12:27 PM  Dressing Change Due 04/27/18 04/20/2018 12:27 PM       Victor Wise 04/20/2018, 12:27 PM

## 2018-04-20 NOTE — Care Management Important Message (Signed)
Important Message  Patient Details  Name: Victor Wise MRN: [id number] Date of Birth: [birth year]   Medicare Important Message Given:  Yes    Juliann Pulse A Odilia Damico 04/20/2018, 12:16 PM

## 2018-04-20 NOTE — Care Management (Signed)
Victor Wise with [facility] updated that ID is recommending home IV antibiotic.

## 2018-04-20 NOTE — Evaluation (Signed)
Physical Therapy Evaluation Patient Details Name: Victor Wise MRN: [id number] DOB: 09-06-1950 Today's Date: 04/20/2018   History of Present Illness  Pt is 67 yo male s/p L great toe amputation and I&D 04/19/2018. PMH of HTN, former smoker, past MI, CHF, CVA, depression, reanl disease, DM II, pacemaker, spinal cord stimulator  Clinical Impression  Patient alert, agreeable to PT, no complaints at start of session. Pt reported living alone in first floor apartment, independent in ADLs, needs assistance with IADLS, and has family/neighbors that provide assistance PRN/intermittently. Pt reported that he ambulates in house short distances with RW, also uses WC for mobility.  Pt demonstrated bed mobility mod I. Post op shoe in place to adhere to pressure through L heel only precaution. Sit <> stand from elevated surface CGA, from recliner, minAx1 and stabilization through RW due to pt pulling up. Unable to transfer mod I. Ambulated in room ~8ft with CGA, SOB/fatigued noted at end of ambulation. Pt exhibited limitations (see "PT Problem "List") that impede his ability to perform functional activities and would benefit from skilled PT to address these. Current recommendation is STR due to level of assistance needed for mobility including safety concerns during transfers, and decreased caregiver support.     Follow Up Recommendations SNF    Equipment Recommendations  3in1 (PT)    Recommendations for Other Services       Precautions / Restrictions Precautions Precautions: Fall;Other (comment) Precaution Comments: post op shoe, pressure through L heel only Required Braces or Orthoses: Other Brace Restrictions Weight Bearing Restrictions: Yes LLE Weight Bearing: (pressure only through L heel per podiatry, post op shoe in place)      Mobility  Bed Mobility Overal bed mobility: Needs Assistance Bed Mobility: Supine to Sit     Supine to sit: HOB elevated;Modified independent (Device/Increase  time)        Transfers Overall transfer level: Needs assistance   Transfers: Sit to/from Stand Sit to Stand: Min guard;Min assist;From elevated surface         General transfer comment: Pt able to perform sit to stand from elevated surface (Bed) with CGA, from recliner, 2-3 attempts with CGA, minAx1 needed and stabilization of RW due to pt pulling to stand.   Ambulation/Gait Ambulation/Gait assistance: Min guard Gait Distance (Feet): 20 Feet Assistive device: Rolling walker (2 wheeled)       General Gait Details: Gait altered by post op shoe on LLE. overall steady, good use of RW. CGA. SOB/fatigue noted at end of ambulation.   Stairs            Wheelchair Mobility    Modified Rankin (Stroke Patients Only)       Balance Overall balance assessment: Needs assistance Sitting-balance support: Feet supported Sitting balance-Leahy Scale: Good       Standing balance-Leahy Scale: Poor                               Pertinent Vitals/Pain Pain Assessment: No/denies pain    Home Living Family/patient expects to be discharged to:: Private residence Living Arrangements: Alone Available Help at Discharge: Available PRN/intermittently;Friend(s);Family Type of Home: Apartment Home Access: Level entry     Home Layout: One level Home Equipment: Grab bars - toilet;Grab bars - tub/shower;Walker - 2 wheels;Hand held shower head;Shower seat - built in;Cane - single point;Wheelchair - manual;Cane - quad Additional Comments: Pt reports that family and neighbors assist as needed    Prior Function  Level of Independence: Independent with assistive device(s)         Comments: Family/friends assist with meals/IADLs, mod I for ADLs, per pt he is working on his walking with RW, uses his WC predominantly.      Hand Dominance   Dominant Hand: Right    Extremity/Trunk Assessment   Upper Extremity Assessment Upper Extremity Assessment: Overall WFL for tasks  assessed;RUE deficits/detail RUE Deficits / Details: formal MMT not tested, PICC line    Lower Extremity Assessment Lower Extremity Assessment: RLE deficits/detail;LLE deficits/detail RLE Deficits / Details: grossly 4-/5, no ankle testing LLE Deficits / Details: grossly 4-/5, knee extension 3+/5, no ankle testing       Communication   Communication: No difficulties  Cognition Arousal/Alertness: Awake/alert Behavior During Therapy: WFL for tasks assessed/performed Overall Cognitive Status: Within Functional Limits for tasks assessed                                        General Comments      Exercises     Assessment/Plan    PT Assessment Patient needs continued PT services  PT Problem List Decreased strength;Decreased range of motion;Obesity;Decreased activity tolerance;Decreased balance;Pain;Decreased mobility       PT Treatment Interventions DME instruction;Balance training;Gait training;Neuromuscular re-education;Stair training;Functional mobility training;Therapeutic activities;Patient/family education;Therapeutic exercise    PT Goals (Current goals can be found in the Care Plan section)  Acute Rehab PT Goals Patient Stated Goal: Pt wants to go home PT Goal Formulation: With patient Time For Goal Achievement: 05/04/18 Potential to Achieve Goals: Fair    Frequency 7X/week   Barriers to discharge Decreased caregiver support      Co-evaluation               AM-PAC PT "6 Clicks" Mobility  Outcome Measure Help needed turning from your back to your side while in a flat bed without using bedrails?: A Little Help needed moving from lying on your back to sitting on the side of a flat bed without using bedrails?: A Little Help needed moving to and from a bed to a chair (including a wheelchair)?: A Little Help needed standing up from a chair using your arms (e.g., wheelchair or bedside chair)?: A Lot Help needed to walk in hospital room?: A  Little Help needed climbing 3-5 steps with a railing? : Total 6 Click Score: 15    End of Session Equipment Utilized During Treatment: Gait belt Activity Tolerance: Patient tolerated treatment well Patient left: with chair alarm set;in chair;with nursing/sitter in room;with call bell/phone within reach;Other (comment)(heels and LLE elevated) Nurse Communication: Mobility status PT Visit Diagnosis: Unsteadiness on feet (R26.81);Difficulty in walking, not elsewhere classified (R26.2);Pain;Muscle weakness (generalized) (M62.81) Pain - Right/Left: Left Pain - part of body: Ankle and joints of foot    Time: 1937-9024 PT Time Calculation (min) (ACUTE ONLY): 40 min   Charges:   PT Evaluation $PT Eval Moderate Complexity: 1 Mod PT Treatments $Therapeutic Activity: 23-37 mins       Lieutenant Diego PT, DPT 3:27 PM,04/20/18 [phone]

## 2018-04-20 NOTE — Consult Note (Signed)
Pharmacy Antibiotic Note  Victor Wise is a 67 y.o. male admitted on 04/18/2018 with wound infection.  Pharmacy has been consulted for ceftazidime and Vancomycin dosing.  Plan:  Ceftazidime 2g q 8hr  Adjust vancomycin to 1500mg  IV q12h as per last admission.  At that time, patient initiated on 1250mg  IV q8h and vancomycin troughs were therapeutic but increased on recheck.    Ke = 0.099 hr-1, t 1/2 = 7 hr, Vd = 65 L   F/u culture results.    Anticipate a course of IV antibiotic at discharge for osteomyelitis  Will recheck VT prior to the 6th maintenance dose of new regimen (can check with am labs)  Temp (24hrs), Avg:97.7 F (36.5 C), Min:97 F (36.1 C), Max:99 F (37.2 C)  Recent Labs  Lab 04/18/18 1754 04/19/18 0330 04/20/18 0426  WBC 5.9 4.4 5.0  CREATININE 0.93 0.84 0.83    Estimated Creatinine Clearance: 115 mL/min (by C-G formula based on SCr of 0.83 mg/dL).    Allergies  Allergen Reactions  . Amoxicillin Other (See Comments)    Has never taken amoxicillin -ENT told him not to take (? Had skin test) Has patient had a PCN reaction causing immediate rash, facial/tongue/throat swelling, SOB or lightheadedness with hypotension: No Has patient had a PCN reaction causing severe rash involving mucus membranes or skin necrosis: No Has patient had a PCN reaction that required hospitalization No Has patient had a PCN reaction occurring within the last 10 years: No If above answers are "NO", then may proceed w/ Cephalo  . Other Anaphylaxis, Itching and Other (See Comments)    Pt states that he is allergic to Endopa.  Reaction:  Anaphylaxis  Pt states that he is allergic to Metabisulfites Reaction:  Itching   . Penicillins Other (See Comments)    Arm turned "blue" at injection site (no treatment needed) Has patient had a PCN reaction causing immediate rash, facial/tongue/throat swelling, SOB or lightheadedness with hypotension: No Has patient had a PCN reaction causing severe  rash involving mucus membranes or skin necrosis: No Has patient had a PCN reaction that required hospitalization No Has patient had a PCN reaction occurring within the last 10 years: No If all of the above answers are "NO", then may proceed with Cephalosporin use.  [name] Hydralazine Other (See Comments)    Reaction:  Cramping of extremities Pt reports tolerating low dose 12.5mg  but no higher.  . Cephalexin Itching  . Crestor [Rosuvastatin Calcium] Other (See Comments)    Reaction:  Pt is unable to move arms/legs   . Metoprolol Nausea And Vomiting  . Red Dye Itching  . Statins Other (See Comments)    Reaction:  Pt is unable to move arms/legs  . Sulfa Antibiotics Itching    Other reaction(s): Unknown  . Yellow Dyes (Non-Tartrazine) Itching  . Aspirin Itching and Other (See Comments)    Pt states that he is able to use in lower doses.    . Tape Rash    Please use "paper" tape only. Please use "paper" tape only.    Antimicrobials this admission: Vancomycin 12/4  >>  Metronidazole 12/4 >>  Aztreonam 12/4 >>  Dose adjustments this admission: N/A  Microbiology results: None at this time  Thank you for allowing pharmacy to be a part of this patient's care.  Doreene Eland, PharmD, BCPS.   Work Cell: [phone] 04/20/2018 8:26 AM

## 2018-04-21 ENCOUNTER — Inpatient Hospital Stay: Payer: Medicare HMO

## 2018-04-21 LAB — GLUCOSE, CAPILLARY
GLUCOSE-CAPILLARY: 270 mg/dL — AB (ref 70–99)
Glucose-Capillary: 252 mg/dL — ABNORMAL HIGH (ref 70–99)
Glucose-Capillary: 242 mg/dL — ABNORMAL HIGH (ref 70–99)
Glucose-Capillary: 243 mg/dL — ABNORMAL HIGH (ref 70–99)

## 2018-04-21 LAB — BASIC METABOLIC PANEL WITH GFR
Anion gap: 4 — ABNORMAL LOW (ref 5–15)
BUN: 15 mg/dL (ref 8–23)
CO2: 29 mmol/L (ref 22–32)
Calcium: 8.1 mg/dL — ABNORMAL LOW (ref 8.9–10.3)
Chloride: 106 mmol/L (ref 98–111)
Creatinine, Ser: 0.79 mg/dL (ref 0.61–1.24)
GFR calc Af Amer: >60 mL/min
GFR calc non Af Amer: >60 mL/min
Glucose, Bld: 277 mg/dL — ABNORMAL HIGH (ref 70–99)
Potassium: 3.8 mmol/L (ref 3.5–5.1)
Sodium: 139 mmol/L (ref 135–145)

## 2018-04-21 MED ORDER — AMLODIPINE BESYLATE 5 MG PO TABS
5.0000 mg | ORAL_TABLET | Freq: Every day | ORAL | Status: DC
  Administered 2018-04-21: 5 mg via ORAL
  Filled 2018-04-21: qty 1

## 2018-04-21 MED ORDER — INSULIN GLARGINE 100 UNIT/ML ~~LOC~~ SOLN
70.0000 [IU] | Freq: Every day | SUBCUTANEOUS | Status: DC
  Administered 2018-04-21 – 2018-04-22 (×2): 70 [IU] via SUBCUTANEOUS
  Filled 2018-04-21 (×3): qty 0.7

## 2018-04-21 MED ORDER — HYDROCHLOROTHIAZIDE 12.5 MG PO CAPS
12.5000 mg | ORAL_CAPSULE | Freq: Every day | ORAL | Status: DC
  Administered 2018-04-21 – 2018-04-22 (×2): 12.5 mg via ORAL
  Filled 2018-04-21 (×2): qty 1

## 2018-04-21 MED ORDER — ENALAPRILAT 1.25 MG/ML IV SOLN
1.2500 mg | Freq: Once | INTRAVENOUS | Status: AC
  Administered 2018-04-21: 1.25 mg via INTRAVENOUS
  Filled 2018-04-21: qty 1

## 2018-04-21 MED ORDER — DIPHENHYDRAMINE HCL 25 MG PO CAPS
50.0000 mg | ORAL_CAPSULE | Freq: Once | ORAL | Status: AC
  Administered 2018-04-21: 50 mg via ORAL
  Filled 2018-04-21: qty 2

## 2018-04-21 NOTE — Progress Notes (Signed)
BP 187/75 this am. Notified MD.

## 2018-04-21 NOTE — Progress Notes (Signed)
PT Cancellation Note  Patient Details Name: Victor Wise MRN: [id number] DOB: [birth year]   Cancelled Treatment:    Reason Eval/Treat Not Completed: Other (comment)   Pt laying in bed.  Reported feeling poorly today and refused therapy interventions.  He was not overly specific in complaints but did say his legs both felt heavy and he was having a difficult time moving them.  Chesley Noon 04/21/2018, 10:36 AM

## 2018-04-21 NOTE — Progress Notes (Signed)
BP still elevated at 184/81. MD notified. New med ordered.

## 2018-04-21 NOTE — Progress Notes (Signed)
[facility] NAME: [name]    MR#:  [id number]  DATE OF BIRTH:  03/24/1951  SUBJECTIVE:  CHIEF COMPLAINT:  No chief complaint on file.  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 -Complains of weakness in both legs last night along with "pins and needle" sensation , when I saw in the morning he was able to move both legs and was feeling back to normal.  REVIEW OF SYSTEMS:  Review of Systems  Constitutional: Positive for malaise/fatigue. Negative for chills and fever.  HENT: Negative for congestion, ear discharge, hearing loss and nosebleeds.   Eyes: Negative for blurred vision and double vision.  Respiratory: Negative for cough, shortness of breath and wheezing.   Cardiovascular: Positive for leg swelling. Negative for chest pain and palpitations.  Gastrointestinal: Negative for abdominal pain, constipation, diarrhea, nausea and vomiting.  Genitourinary: Negative for dysuria.  Musculoskeletal: Positive for joint pain and myalgias.  Neurological: Negative for dizziness, focal weakness, seizures, weakness and headaches.  Psychiatric/Behavioral: Negative for depression.    DRUG ALLERGIES:   Allergies  Allergen Reactions  . Amoxicillin Other (See Comments)    Has never taken amoxicillin -ENT told him not to take (? Had skin test) Has patient had a PCN reaction causing immediate rash, facial/tongue/throat swelling, SOB or lightheadedness with hypotension: No Has patient had a PCN reaction causing severe rash involving mucus membranes or skin necrosis: No Has patient had a PCN reaction that required hospitalization No Has patient had a PCN reaction occurring within the last 10 years: No If above answers are "NO", then may proceed w/ Cephalo  . Other Anaphylaxis, Itching and Other (See Comments)    Pt states that he is allergic to Endopa.  Reaction:  Anaphylaxis    Pt states that he is allergic to Metabisulfites Reaction:  Itching   . Penicillins Other (See Comments)    Arm turned "blue" at injection site (no treatment needed) Has patient had a PCN reaction causing immediate rash, facial/tongue/throat swelling, SOB or lightheadedness with hypotension: No Has patient had a PCN reaction causing severe rash involving mucus membranes or skin necrosis: No Has patient had a PCN reaction that required hospitalization No Has patient had a PCN reaction occurring within the last 10 years: No If all of the above answers are "NO", then may proceed with Cephalosporin use.  [name] Hydralazine Other (See Comments)    Reaction:  Cramping of extremities Pt reports tolerating low dose 12.5mg  but no higher.  . Cephalexin Itching  . Crestor [Rosuvastatin Calcium] Other (See Comments)    Reaction:  Pt is unable to move arms/legs   . Metoprolol Nausea And Vomiting  . Red Dye Itching  . Statins Other (See Comments)    Reaction:  Pt is unable to move arms/legs  . Sulfa Antibiotics Itching    Other reaction(s): Unknown  . Yellow Dyes (Non-Tartrazine) Itching  . Aspirin Itching and Other (See Comments)    Pt states that he is able to use in lower doses.    . Tape Rash    Please use "paper" tape only. Please use "paper" tape only.    VITALS:  Blood pressure (!) 166/81, pulse 61, temperature 98.9 F (37.2 C), temperature source Oral, resp. rate 18, height 5\' 7"  (1.702 m), weight 133.2 kg, SpO2 97 %.  PHYSICAL EXAMINATION:  Physical Exam  GENERAL:  67 y.o.-year-old morbidly obese  patient lying in the bed with no acute distress.  EYES: Pupils equal, round, reactive to light and accommodation. No scleral icterus. Extraocular muscles intact.  HEENT: Head atraumatic, normocephalic. Oropharynx and nasopharynx clear.  NECK:  Supple, no jugular venous distention. No thyroid enlargement, no tenderness.  LUNGS: Normal breath sounds bilaterally, no wheezing, rales,rhonchi or  crepitation. No use of accessory muscles of respiration.  Decreased bibasilar breath sounds CARDIOVASCULAR: S1, S2 normal. No rubs, or gallops.  3/6 systolic murmur is present ABDOMEN: Soft, nontender, nondistended. Bowel sounds present. No organomegaly or mass.  EXTREMITIES: No cyanosis, or clubbing.  1+ pedal edema.  Left foot dressing in place NEUROLOGIC: Cranial nerves II through XII are intact. Muscle strength 5/5 in all extremities. Sensation intact. Gait not checked.  PSYCHIATRIC: The patient is alert and oriented x 3.  SKIN: No obvious rash, lesion, or ulcer.    LABORATORY PANEL:   CBC Recent Labs  Lab 04/20/18 0426  WBC 5.0  HGB 9.7*  HCT 31.6*  PLT 255   ------------------------------------------------------------------------------------------------------------------  Chemistries  Recent Labs  Lab 04/18/18 1754  04/21/18 0600  NA 136   < > 139  K 4.1   < > 3.8  CL 100   < > 106  CO2 27   < > 29  GLUCOSE 346*   < > 277*  BUN 21   < > 15  CREATININE 0.93   < > 0.79  CALCIUM 8.7*   < > 8.1*  AST 18  --   --   ALT 15  --   --   ALKPHOS 67  --   --   BILITOT 0.3  --   --    < > = values in this interval not displayed.   ------------------------------------------------------------------------------------------------------------------  Cardiac Enzymes No results for input(s): TROPONINI in the last 168 hours. ------------------------------------------------------------------------------------------------------------------  RADIOLOGY:  [place] Ekg Site Rite  Result Date: 04/20/2018 If Site Rite image not attached, placement could not be confirmed due to current cardiac rhythm.   EKG:   Orders placed or performed during the hospital encounter of 10/26/15  . EKG 12-Lead  . EKG 12-Lead  . EKG 12-Lead in am (before 8am)  . EKG 12-Lead in am (before 8am)  . EKG    ASSESSMENT AND PLAN:   67 year old male with past medical history significant for obesity,  hypertension, diabetes with neuropathy, sleep apnea, history of CVA, chronic low back pain and nonhealing left toe ulcer presents to hospital secondary for further management  1.  Cellulitis and osteomyelitis of left forefoot-nonhealing diabetic foot ulcer, peripheral arterial disease secondary to diabetes mellitus -Appreciate podiatry consult -Patient that is post debridement and left foot first toe amputation. -PICC line placed.  Appreciate ID and podiatry consults. -Currently on IV antibiotics with vancomycin, Fortaz and Flagyl. -Await cultures. -Physical therapy consulted  2.  Diabetic neuropathy-continue Lyrica and pain medications  3.  Diabetes mellitus with complications of peripheral arterial disease and diabetic infection and peripheral neuropathy -on Lantus and sliding scale insulin  4.   Hypertension-benazepril and hydrochlorothiazide    Added amlodipine for better control.     5.  DVT prophylaxis-can start subcu heparin today  Has a wheelchair at home, ambulates with a walker -Physical therapy today and weightbearing per podiatry                           All the records are reviewed and case discussed with Care Management/Social Workerr.  Management plans discussed with the patient, family and they are in agreement.  CODE STATUS: Full code  TOTAL TIME TAKING CARE OF THIS PATIENT: 35 minutes.   POSSIBLE D/C IN 2-3 DAYS, DEPENDING ON CLINICAL CONDITION.   Vaughan Basta M.D on 04/21/2018 at 3:21 PM  Between 7am to 6pm - Pager - [phone]  After 6pm go to [email or web address] - password [email or web address] [facility]  Office  [phone]  CC: Primary care physician; Lavera Guise, MD

## 2018-04-21 NOTE — Progress Notes (Signed)
[facility] INFECTIOUS DISEASE PROGRESS NOTE Date of Admission:  04/18/2018     ID: Victor Wise is a 67 y.o. male with diabetic foot infection Active Problems:   Diabetic ulcer of left foot ([place])   Subjective: Had surg yest with amputation of L great toe and debridement of L first MT bone  ROS  Eleven systems are reviewed and negative except per hpi  Medications:  Antibiotics Given (last 72 hours)    Date/Time Action Medication Dose Rate   04/18/18 2200 New Bag/Given   metroNIDAZOLE (FLAGYL) IVPB 500 mg 500 mg 100 mL/hr   04/18/18 2240 New Bag/Given   vancomycin (VANCOCIN) IVPB 1000 mg/200 mL premix 1,000 mg 200 mL/hr   04/18/18 2300 New Bag/Given   aztreonam (AZACTAM) 2 g in sodium chloride 0.9 % 100 mL IVPB 2 g 200 mL/hr   04/19/18 0300 New Bag/Given   metroNIDAZOLE (FLAGYL) IVPB 500 mg 500 mg 100 mL/hr   04/19/18 0350 New Bag/Given   vancomycin (VANCOCIN) IVPB 1000 mg/200 mL premix 1,000 mg 200 mL/hr   04/19/18 0547 New Bag/Given   aztreonam (AZACTAM) 2 g in sodium chloride 0.9 % 100 mL IVPB 2 g 200 mL/hr   04/19/18 1211 New Bag/Given   vancomycin (VANCOCIN) 1,250 mg in sodium chloride 0.9 % 250 mL IVPB 1,250 mg 166.7 mL/hr   04/19/18 1550 New Bag/Given   metroNIDAZOLE (FLAGYL) IVPB 500 mg 500 mg 100 mL/hr   04/19/18 1742 New Bag/Given   aztreonam (AZACTAM) 2 g in sodium chloride 0.9 % 100 mL IVPB 2 g 200 mL/hr   04/19/18 1953 Given  [mixed with 240mg  gentamicin for antibiotic beads]   vancomycin (VANCOCIN) powder 1,000 mg    04/19/18 2222 New Bag/Given   cefTAZidime (FORTAZ) 2 g in sodium chloride 0.9 % 100 mL IVPB 2 g 200 mL/hr   04/20/18 0324 New Bag/Given   metroNIDAZOLE (FLAGYL) IVPB 500 mg 500 mg 100 mL/hr   04/20/18 0458 New Bag/Given   vancomycin (VANCOCIN) 1,250 mg in sodium chloride 0.9 % 250 mL IVPB 1,250 mg 166.7 mL/hr   04/20/18 0655 New Bag/Given   cefTAZidime (FORTAZ) 2 g in sodium chloride 0.9 % 100 mL IVPB 2 g 200 mL/hr   04/20/18 1323 New  Bag/Given   metroNIDAZOLE (FLAGYL) IVPB 500 mg 500 mg 100 mL/hr   04/20/18 1507 New Bag/Given   cefTAZidime (FORTAZ) 2 g in sodium chloride 0.9 % 100 mL IVPB 2 g 200 mL/hr   04/20/18 1708 New Bag/Given   vancomycin (VANCOCIN) 1,500 mg in sodium chloride 0.9 % 500 mL IVPB 1,500 mg 250 mL/hr   04/20/18 2135 New Bag/Given   metroNIDAZOLE (FLAGYL) IVPB 500 mg 500 mg 100 mL/hr   04/20/18 2258 New Bag/Given   cefTAZidime (FORTAZ) 2 g in sodium chloride 0.9 % 100 mL IVPB 2 g 200 mL/hr   04/21/18 0513 New Bag/Given   metroNIDAZOLE (FLAGYL) IVPB 500 mg 500 mg 100 mL/hr   04/21/18 5638 New Bag/Given   cefTAZidime (FORTAZ) 2 g in sodium chloride 0.9 % 100 mL IVPB 2 g 200 mL/hr   04/21/18 0841 New Bag/Given   vancomycin (VANCOCIN) 1,500 mg in sodium chloride 0.9 % 500 mL IVPB 1,500 mg 250 mL/hr   04/21/18 1111 New Bag/Given   metroNIDAZOLE (FLAGYL) IVPB 500 mg 500 mg 100 mL/hr     . amLODipine  5 mg Oral Daily  . aspirin EC  81 mg Oral Daily  . benazepril  20 mg Oral BID  . DULoxetine  60 mg Oral Daily  . fenofibrate  54 mg Oral Q supper  . fluticasone  2 spray Each Nare Daily  . heparin injection (subcutaneous)  5,000 Units Subcutaneous Q8H  . hydrochlorothiazide  12.5 mg Oral Daily  . insulin aspart  0-5 Units Subcutaneous QHS  . insulin aspart  0-9 Units Subcutaneous TID WC  . insulin glargine  70 Units Subcutaneous QHS  . omega-3 acid ethyl esters  1 g Oral BID  . OXcarbazepine  300 mg Oral q morning - 10a  . OXcarbazepine  600 mg Oral QHS  . oxyCODONE  20 mg Oral Q12H  . pantoprazole  40 mg Oral Daily  . pregabalin  200 mg Oral QHS  . pregabalin  400 mg Oral q morning - 10a  . sodium chloride flush  10-40 mL Intracatheter Q12H  . tamsulosin  0.4 mg Oral QPC supper    Objective: Vital signs in last 24 hours: Temp:  [97.3 F (36.3 C)-99.1 F (37.3 C)] 98.4 F (36.9 C) (12/07 0752) Pulse Rate:  [60-67] 67 (12/07 1126) Resp:  [18] 18 (12/07 0017) BP: (118-195)/(54-85) 184/81  (12/07 1126) SpO2:  [93 %-99 %] 99 % (12/07 1126) Constitutional: He is oriented to person, place, and time. Morbidly obese HENT: anicteric Mouth/Throat: Oropharynx is clear and moist. No oropharyngeal exudate.  Cardiovascular: Normal rate, regular rhythm and normal heart sounds. Exam reveals no gallop and no friction rub.  Pulmonary/Chest: Effort normal and breath sounds normal. No respiratory distress.  Abdominal: Soft. Bowel sounds are normal. He exhibits no distension. There is no tenderness.  Lymphadenopathy: He has no cervical adenopathy.  Neurological: He is alert and oriented to person, place, and time.  Ext 1+ edema bil Skin: L foot wrapped post op Psychiatric: He has a normal mood and affect. His behavior is normal.   Lab Results Recent Labs    04/19/18 0330 04/20/18 0426 04/21/18 0600  WBC 4.4 5.0  --   HGB 10.2* 9.7*  --   HCT 32.5* 31.6*  --   NA 139 138 139  K 3.8 3.8 3.8  CL 104 107 106  CO2 29 28 29   BUN 18 16 15   CREATININE 0.84 0.83 0.79    Microbiology: Results for orders placed or performed during the hospital encounter of 04/18/18  Aerobic/Anaerobic Culture (surgical/deep wound)     Status: None (Preliminary result)   Collection Time: 04/19/18  8:28 AM  Result Value Ref Range Status   Specimen Description   Final    FOOT LEFT Performed at [facility], [address], [place], [place] [address]    Special Requests   Final    Normal Performed at [facility], [address] [place], [place] [address]    Gram Stain   Final    RARE WBC PRESENT, PREDOMINANTLY PMN RARE GRAM POSITIVE COCCI    Culture   Final    CULTURE REINCUBATED FOR BETTER GROWTH Performed at [facility], [place] [address] [place], [place] [address]    Report Status PENDING  Incomplete  Aerobic/Anaerobic Culture (surgical/deep wound)     Status: None (Preliminary result)   Collection Time: 04/19/18  7:58 PM  Result Value Ref Range Status   Specimen  Description   Final    BONE BX Performed at [facility], [address], [place], [place] [address]    Special Requests   Final    NONE Performed at [facility], [place],  [place] [address]    Gram Stain   Final    RARE WBC PRESENT, PREDOMINANTLY MONONUCLEAR RARE GRAM POSITIVE COCCI RARE GRAM NEGATIVE RODS    Culture   Final    TOO YOUNG TO READ Performed at [facility], [place] [address] [place], [place] [address]    Report Status PENDING  Incomplete    Studies/Results: [place] Ekg Site Rite  Result Date: 04/20/2018 If Site Rite image not attached, placement could not be confirmed due to current cardiac rhythm.   Assessment/Plan: LONGINO TREFZ is a 67 y.o. male with DM foot infection and underlying osteomyelitis. He also has multiple drug allergies. Recent admission in Oct had angio and I and D. Cultures from then grew E coli, enterococcus, Bacteroides. Was treated with IV abx while inpatient but since had surgery was felt infection was cleared and dced on oral cephalosporin.  Obviously with exposed bone has osteomyelitis - s/p surgery 12/6   12/7 remains on vanco, ceftazidime and flagyl - tolerating it well. Picc placed  Cultures pending  Recommendations Cont ceftazidime. Cont vanco pending cultures. Continue flagyl for now as well. Will likely dc on this regimen since cannot tolerate penicillins for the enterococcus Thank you very much for the consult. Will follow with you.  CLOY COZZENS   04/21/2018, 12:14 PM

## 2018-04-21 NOTE — Progress Notes (Signed)
Patients blood pressure is running systolic 765'Y.  Paged prime doc with new order.  Patient is allergic to suggested IV hydralazine.  repaged prime doc.

## 2018-04-22 LAB — GLUCOSE, CAPILLARY
GLUCOSE-CAPILLARY: 239 mg/dL — AB (ref 70–99)
Glucose-Capillary: 198 mg/dL — ABNORMAL HIGH (ref 70–99)
Glucose-Capillary: 197 mg/dL — ABNORMAL HIGH (ref 70–99)
Glucose-Capillary: 316 mg/dL — ABNORMAL HIGH (ref 70–99)

## 2018-04-22 MED ORDER — SODIUM CHLORIDE 0.9 % IV SOLN
1.0000 g | Freq: Three times a day (TID) | INTRAVENOUS | Status: DC
  Administered 2018-04-22 – 2018-04-23 (×3): 1 g via INTRAVENOUS
  Filled 2018-04-22 (×5): qty 1

## 2018-04-22 MED ORDER — AMLODIPINE BESYLATE 10 MG PO TABS
10.0000 mg | ORAL_TABLET | Freq: Every day | ORAL | Status: DC
  Administered 2018-04-22 – 2018-04-23 (×2): 10 mg via ORAL
  Filled 2018-04-22 (×2): qty 1

## 2018-04-22 MED ORDER — LORATADINE 10 MG PO TABS
10.0000 mg | ORAL_TABLET | Freq: Every day | ORAL | Status: DC
  Administered 2018-04-22 – 2018-04-23 (×2): 10 mg via ORAL
  Filled 2018-04-22 (×2): qty 1

## 2018-04-22 MED ORDER — OXYCODONE HCL ER 10 MG PO T12A
10.0000 mg | EXTENDED_RELEASE_TABLET | Freq: Two times a day (BID) | ORAL | Status: DC
  Administered 2018-04-22 – 2018-04-23 (×2): 10 mg via ORAL
  Filled 2018-04-22 (×2): qty 1

## 2018-04-22 MED ORDER — PREGABALIN 50 MG PO CAPS
200.0000 mg | ORAL_CAPSULE | Freq: Every morning | ORAL | Status: DC
  Administered 2018-04-22 – 2018-04-23 (×2): 200 mg via ORAL
  Filled 2018-04-22 (×2): qty 4

## 2018-04-22 NOTE — Progress Notes (Signed)
[facility] [place] NAME: Eduar Kumpf    MR#:  [id number]  DATE OF BIRTH:  1950-10-31  SUBJECTIVE:  CHIEF COMPLAINT:  No chief complaint on file.  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  REVIEW OF SYSTEMS:  Review of Systems  Constitutional: Positive for malaise/fatigue. Negative for chills and fever.  HENT: Negative for congestion, ear discharge, hearing loss and nosebleeds.   Eyes: Negative for blurred vision and double vision.  Respiratory: Negative for cough, shortness of breath and wheezing.   Cardiovascular: Positive for leg swelling. Negative for chest pain and palpitations.  Gastrointestinal: Negative for abdominal pain, constipation, diarrhea, nausea and vomiting.  Genitourinary: Negative for dysuria.  Musculoskeletal: Positive for joint pain and myalgias.  Neurological: Negative for dizziness, focal weakness, seizures, weakness and headaches.  Psychiatric/Behavioral: Negative for depression.    DRUG ALLERGIES:   Allergies  Allergen Reactions  . Amoxicillin Other (See Comments)    Has never taken amoxicillin -ENT told him not to take (? Had skin test) Has patient had a PCN reaction causing immediate rash, facial/tongue/throat swelling, SOB or lightheadedness with hypotension: No Has patient had a PCN reaction causing severe rash involving mucus membranes or skin necrosis: No Has patient had a PCN reaction that required hospitalization No Has patient had a PCN reaction occurring within the last 10 years: No If above answers are "NO", then may proceed w/ Cephalo  . Other Anaphylaxis, Itching and Other (See Comments)    Pt states that he is allergic to Endopa.  Reaction:  Anaphylaxis  Pt states that he is allergic to Metabisulfites Reaction:  Itching   . Penicillins Other (See  Comments)    Arm turned "blue" at injection site (no treatment needed) Has patient had a PCN reaction causing immediate rash, facial/tongue/throat swelling, SOB or lightheadedness with hypotension: No Has patient had a PCN reaction causing severe rash involving mucus membranes or skin necrosis: No Has patient had a PCN reaction that required hospitalization No Has patient had a PCN reaction occurring within the last 10 years: No If all of the above answers are "NO", then may proceed with Cephalosporin use.  [name] Hydralazine Other (See Comments)    Reaction:  Cramping of extremities Pt reports tolerating low dose 12.5mg  but no higher.  . Cephalexin Itching  . Crestor [Rosuvastatin Calcium] Other (See Comments)    Reaction:  Pt is unable to move arms/legs   . Metoprolol Nausea And Vomiting  . Red Dye Itching  . Statins Other (See Comments)    Reaction:  Pt is unable to move arms/legs  . Sulfa Antibiotics Itching    Other reaction(s): Unknown  . Yellow Dyes (Non-Tartrazine) Itching  . Aspirin Itching and Other (See Comments)    Pt states that he is able to use in lower doses.    . Tape Rash    Please use "paper" tape only. Please use "paper" tape only.    VITALS:  Blood pressure (!) 168/95, pulse 60, temperature 98.3 F (36.8 C), temperature source Oral, resp. rate 14, height 5\' 7"  (1.702 m), weight 133.2 kg, SpO2 99 %.  PHYSICAL EXAMINATION:  Physical Exam  GENERAL:  67 y.o.-year-old morbidly obese patient lying in the bed with no acute distress.  EYES: Pupils equal, round, reactive to light and accommodation. No  scleral icterus. Extraocular muscles intact.  HEENT: Head atraumatic, normocephalic. Oropharynx and nasopharynx clear.  NECK:  Supple, no jugular venous distention. No thyroid enlargement, no tenderness.  LUNGS: Normal breath sounds bilaterally, no wheezing, rales,rhonchi or crepitation. No use of accessory muscles of respiration.  Decreased bibasilar breath  sounds CARDIOVASCULAR: S1, S2 normal. No rubs, or gallops.  3/6 systolic murmur is present ABDOMEN: Soft, nontender, nondistended. Bowel sounds present. No organomegaly or mass.  EXTREMITIES: No cyanosis, or clubbing.  1+ pedal edema.  Left foot dressing in place NEUROLOGIC: Cranial nerves II through XII are intact. Muscle strength 5/5 in all extremities. Sensation intact. Gait not checked.  PSYCHIATRIC: The patient is alert and oriented x 3.  SKIN: No obvious rash, lesion, or ulcer.    LABORATORY PANEL:   CBC Recent Labs  Lab 04/20/18 0426  WBC 5.0  HGB 9.7*  HCT 31.6*  PLT 255   ------------------------------------------------------------------------------------------------------------------  Chemistries  Recent Labs  Lab 04/18/18 1754  04/21/18 0600  NA 136   < > 139  K 4.1   < > 3.8  CL 100   < > 106  CO2 27   < > 29  GLUCOSE 346*   < > 277*  BUN 21   < > 15  CREATININE 0.93   < > 0.79  CALCIUM 8.7*   < > 8.1*  AST 18  --   --   ALT 15  --   --   ALKPHOS 67  --   --   BILITOT 0.3  --   --    < > = values in this interval not displayed.   ------------------------------------------------------------------------------------------------------------------  Cardiac Enzymes No results for input(s): TROPONINI in the last 168 hours. ------------------------------------------------------------------------------------------------------------------  RADIOLOGY:  Ct Head Wo Contrast  Result Date: 04/21/2018 CLINICAL DATA:  Muscle weakness, now resolving EXAM: CT HEAD WITHOUT CONTRAST TECHNIQUE: Contiguous axial images were obtained from the base of the skull through the vertex without intravenous contrast. COMPARISON:  04/13/2016 FINDINGS: Brain: No evidence of acute infarction, hemorrhage, hydrocephalus, extra-axial collection or mass lesion/mass effect. Subcortical white matter and periventricular small vessel ischemic changes. Old lacunar infarct in the left corona  radiata/basal ganglia. Vascular: Intracranial atherosclerosis. Skull: Normal. Negative for fracture or focal lesion. Sinuses/Orbits: Mild mucosal thickening of the bilateral ethmoid sinuses. Mastoid air cells are clear. Other: None. IMPRESSION: No evidence of acute intracranial abnormality. 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  Small vessel ischemic changes. Electronically Signed   By: Julian Hy M.D.   On: 04/21/2018 17:51    EKG:   Orders placed or performed during the hospital encounter of 10/26/15  . EKG 12-Lead  . EKG 12-Lead  . EKG 12-Lead in am (before 8am)  . EKG 12-Lead in am (before 8am)  . EKG    ASSESSMENT AND PLAN:   67 year old male with past medical history significant for obesity, hypertension, diabetes with neuropathy, sleep apnea, history of CVA, chronic low back pain and nonhealing left toe ulcer presents to hospital secondary for further management  1.  Cellulitis and osteomyelitis of left forefoot-nonhealing diabetic foot ulcer, peripheral arterial disease secondary to diabetes mellitus -Appreciate podiatry consult -Patient that is post debridement and left foot first toe amputation. -PICC line placed.  Appreciate ID and podiatry consults. -Currently on IV antibiotics with meropenem. (To cover enterococcus, Klebsiella and anaerobes) -Await cultures. -Physical therapy consulted, may need to go to rehab as advised by podiatry.  2.  Diabetic neuropathy-continue Lyrica and pain medications  3.  Diabetes  mellitus with complications of peripheral arterial disease and diabetic infection and peripheral neuropathy -on Lantus and sliding scale insulin  4.   Hypertension-benazepril and hydrochlorothiazide    Added amlodipine for better control.   Increased dose of amlodipine.  5.  DVT prophylaxis-can start subcu heparin today  Has a wheelchair at home, ambulates with a walker -Physical therapy today and weightbearing per podiatry                          All the records  are reviewed and case discussed with Care Management/Social Workerr. Management plans discussed with the patient, family and they are in agreement.  CODE STATUS: Full code  TOTAL TIME TAKING CARE OF THIS PATIENT: 35 minutes.   POSSIBLE D/C IN 1-2 DAYS, DEPENDING ON CLINICAL CONDITION.   Vaughan Basta M.D on 04/22/2018 at 3:01 PM  Between 7am to 6pm - Pager - [phone]  After 6pm go to [email or web address] - password [email or web address] [facility]  Office  [phone]  CC: Primary care physician; Lavera Guise, MD

## 2018-04-22 NOTE — Progress Notes (Signed)
3 Days Post-Op   Subjective/Chief Complaint: Patient seen.  No complaints.   Objective: Vital signs in last 24 hours: Temp:  [98.3 F (36.8 C)-98.9 F (37.2 C)] 98.3 F (36.8 C) (12/08 0741) Pulse Rate:  [59-67] 60 (12/08 0741) Resp:  [14] 14 (12/08 0013) BP: (146-184)/(72-95) 168/95 (12/08 0741) SpO2:  [92 %-99 %] 99 % (12/08 0741) Last BM Date: 04/20/18  Intake/Output from previous day: 12/07 0701 - 12/08 0700 In: 2828.4 [P.O.:420; IV Piggyback:2408.4] Out: 2350 [Urine:2350] Intake/Output this shift: Total I/O In: -  Out: 300 [Urine:300]  The bandage is dry and intact.  Some moderate bleeding.  Upon removal the incision area appears to be well coapted with minimal erythema and edema.  No purulence noted from the wound.  Lab Results:  Recent Labs    04/20/18 0426  WBC 5.0  HGB 9.7*  HCT 31.6*  PLT 255   BMET Recent Labs    04/20/18 0426 04/21/18 0600  NA 138 139  K 3.8 3.8  CL 107 106  CO2 28 29  GLUCOSE 244* 277*  BUN 16 15  CREATININE 0.83 0.79  CALCIUM 7.8* 8.1*   PT/INR No results for input(s): LABPROT, INR in the last 72 hours. ABG No results for input(s): PHART, HCO3 in the last 72 hours.  Invalid input(s): PCO2, PO2  Studies/Results: Ct Head Wo Contrast  Result Date: 04/21/2018 CLINICAL DATA:  Muscle weakness, now resolving EXAM: CT HEAD WITHOUT CONTRAST TECHNIQUE: Contiguous axial images were obtained from the base of the skull through the vertex without intravenous contrast. COMPARISON:  04/13/2016 FINDINGS: Brain: No evidence of acute infarction, hemorrhage, hydrocephalus, extra-axial collection or mass lesion/mass effect. Subcortical white matter and periventricular small vessel ischemic changes. 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 Vascular: Intracranial atherosclerosis. Skull: Normal. Negative for fracture or focal lesion. Sinuses/Orbits: Mild mucosal thickening of the bilateral ethmoid sinuses. Mastoid air cells are  clear. Other: None. IMPRESSION: No evidence of acute intracranial abnormality. 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  Small vessel ischemic changes. Electronically Signed   By: Julian Hy M.D.   On: 04/21/2018 17:51    Anti-infectives: Anti-infectives (From admission, onward)   Start     Dose/Rate Route Frequency Ordered Stop   04/20/18 1800  vancomycin (VANCOCIN) 1,500 mg in sodium chloride 0.9 % 500 mL IVPB     1,500 mg 250 mL/hr over 120 Minutes Intravenous Every 12 hours 04/20/18 0822     04/19/18 2200  cefTAZidime (FORTAZ) 2 g in sodium chloride 0.9 % 100 mL IVPB     2 g 200 mL/hr over 30 Minutes Intravenous Every 8 hours 04/19/18 1850     04/19/18 1953  vancomycin (VANCOCIN) powder  Status:  Discontinued       As needed 04/19/18 1954 04/19/18 2045   04/19/18 1200  vancomycin (VANCOCIN) 1,250 mg in sodium chloride 0.9 % 250 mL IVPB  Status:  Discontinued     1,250 mg 166.7 mL/hr over 90 Minutes Intravenous Every 8 hours 04/19/18 0825 04/20/18 0822   04/18/18 2200  aztreonam (AZACTAM) 2 g in sodium chloride 0.9 % 100 mL IVPB  Status:  Discontinued     2 g 200 mL/hr over 30 Minutes Intravenous Every 8 hours 04/18/18 1901 04/19/18 1805   04/18/18 2000  vancomycin (VANCOCIN) IVPB 1000 mg/200 mL premix  Status:  Discontinued     1,000 mg 200 mL/hr over 60 Minutes Intravenous Every 8 hours 04/18/18 1913 04/19/18 0825   04/18/18 1900  metroNIDAZOLE (FLAGYL)  IVPB 500 mg     500 mg 100 mL/hr over 60 Minutes Intravenous Every 8 hours 04/18/18 1730        Assessment/Plan: s/p Procedure(s): AMPUTATION TOE-LEFT GREAT TOE (Left) IRRIGATION AND DEBRIDEMENT FOOT (Left) Assessment: Stable status post debridement left foot.   Plan: Sterile dry dressing reapplied to the left foot.  Discussed with the patient that he should be stable for discharge tomorrow.  Discussed going to skilled nursing for his antibiotic and wound management.  Patient will need dressing changes probably 3 times a week at this  point.  Just a dry bulky bandage.  Follow-up outpatient in 2 weeks.  LOS: 4 days    Durward Fortes 04/22/2018

## 2018-04-22 NOTE — Consult Note (Signed)
Pharmacy Antibiotic Note  Victor Wise is a 67 y.o. male admitted on 04/18/2018 with wound infection.  Pharmacy has been consulted for meropenem dosing due to new culture results.  Plan:  Start meropenem 1 gram IV every 8 hours  Temp (24hrs), Avg:98.7 F (37.1 C), Min:98.3 F (36.8 C), Max:98.9 F (37.2 C)  Recent Labs  Lab 04/18/18 1754 04/19/18 0330 04/20/18 0426 04/21/18 0600  WBC 5.9 4.4 5.0  --   CREATININE 0.93 0.84 0.83 0.79    Estimated Creatinine Clearance: 119.4 mL/min (by C-G formula based on SCr of 0.79 mg/dL).    Allergies  Allergen Reactions  . Amoxicillin Other (See Comments)    Has never taken amoxicillin -ENT told him not to take (? Had skin test) Has patient had a PCN reaction causing immediate rash, facial/tongue/throat swelling, SOB or lightheadedness with hypotension: No Has patient had a PCN reaction causing severe rash involving mucus membranes or skin necrosis: No Has patient had a PCN reaction that required hospitalization No Has patient had a PCN reaction occurring within the last 10 years: No If above answers are "NO", then may proceed w/ Cephalo  . Other Anaphylaxis, Itching and Other (See Comments)    Pt states that he is allergic to Endopa.  Reaction:  Anaphylaxis  Pt states that he is allergic to Metabisulfites Reaction:  Itching   . Penicillins Other (See Comments)    Arm turned "blue" at injection site (no treatment needed) Has patient had a PCN reaction causing immediate rash, facial/tongue/throat swelling, SOB or lightheadedness with hypotension: No Has patient had a PCN reaction causing severe rash involving mucus membranes or skin necrosis: No Has patient had a PCN reaction that required hospitalization No Has patient had a PCN reaction occurring within the last 10 years: No If all of the above answers are "NO", then may proceed with Cephalosporin use.  [name] Hydralazine Other (See Comments)    Reaction:  Cramping of extremities Pt reports  tolerating low dose 12.5mg  but no higher.  . Cephalexin Itching  . Crestor [Rosuvastatin Calcium] Other (See Comments)    Reaction:  Pt is unable to move arms/legs   . Metoprolol Nausea And Vomiting  . Red Dye Itching  . Statins Other (See Comments)    Reaction:  Pt is unable to move arms/legs  . Sulfa Antibiotics Itching    Other reaction(s): Unknown  . Yellow Dyes (Non-Tartrazine) Itching  . Aspirin Itching and Other (See Comments)    Pt states that he is able to use in lower doses.    . Tape Rash    Please use "paper" tape only. Please use "paper" tape only.    Antimicrobials this admission: Meropenem 12/8 >> Vancomycin 12/4  >> 12/8 Ceftazidime 12/6 >> 12/8 Metronidazole 12/4 >> 12/8 Aztreonam 12/4 >> 12/5  Microbiology results: Bone Cx: K pneumoniae R to Unasyn, Ancef WCx: E faecalis R to gentamicin  Thank you for allowing pharmacy to be a part of this patient's care.  Vallery Sa, PharmD Clinical Pharmacist 04/22/2018 2:00 PM

## 2018-04-22 NOTE — Progress Notes (Signed)
Physical Therapy Treatment Patient Details Name: Victor Wise MRN: [id number] DOB: 01-Dec-1950 Today's Date: 04/22/2018    History of Present Illness Pt is 67 yo male s/p L great toe amputation and I&D 04/19/2018. PMH of HTN, former smoker, past MI, CHF, CVA, depression, reanl disease, DM II, pacemaker, spinal cord stimulator    PT Comments    Pt was able to walk after being cleaned up from an episode of incontinence, using orthopedic shoes and RW.  Pt is noting some difficulty with paper undergarments staying on and recommended to him that PT limit gait unless he can dress over them.  Follow acutely for progressing gait and pushing time OOB.     Follow Up Recommendations  SNF     Equipment Recommendations  3in1 (PT)    Recommendations for Other Services       Precautions / Restrictions Precautions Precautions: Fall;Other (comment) Precaution Comments: post op shoe, pressure through L heel only Required Braces or Orthoses: Other Brace Other Brace: ortho shoe R foot Restrictions Weight Bearing Restrictions: Yes LLE Weight Bearing: Non weight bearing Other Position/Activity Restrictions: wgt through L heel only    Mobility  Bed Mobility Overal bed mobility: Needs Assistance Bed Mobility: Supine to Sit     Supine to sit: HOB elevated;Min assist     General bed mobility comments: assistance to lift trunk off bed  Transfers Overall transfer level: Needs assistance Equipment used: Rolling walker (2 wheeled);1 person hand held assist Transfers: Sit to/from Stand Sit to Stand: Min assist         General transfer comment: requires less help from higher surface  Ambulation/Gait Ambulation/Gait assistance: Min guard Gait Distance (Feet): 80 Feet Assistive device: Rolling walker (2 wheeled) Gait Pattern/deviations: Step-through pattern;Decreased stride length;Decreased dorsiflexion - right;Decreased dorsiflexion - left;Wide base of support     General Gait Details:  shoes are changing gait but pt is optimistic   Stairs             Wheelchair Mobility    Modified Rankin (Stroke Patients Only)       Balance Overall balance assessment: Needs assistance Sitting-balance support: Feet supported Sitting balance-Leahy Scale: Good   Postural control: Posterior lean Standing balance support: Bilateral upper extremity supported;During functional activity Standing balance-Leahy Scale: Fair                              Cognition Arousal/Alertness: Awake/alert Behavior During Therapy: (lacking in awareness that he needed to be changed) Overall Cognitive Status: No family/caregiver present to determine baseline cognitive functioning                                 General Comments: unaware of needing changed      Exercises      General Comments General comments (skin integrity, edema, etc.): pt is noting pain on R lower leg along with new amputation pain from L foot      Pertinent Vitals/Pain Pain Assessment: No/denies pain    Home Living                      Prior Function            PT Goals (current goals can now be found in the care plan section) Acute Rehab PT Goals Patient Stated Goal: to get to rehab Time For Goal Achievement: 05/06/18 Potential to  Achieve Goals: Good Progress towards PT goals: Progressing toward goals    Frequency    7X/week      PT Plan Current plan remains appropriate    Co-evaluation              AM-PAC PT "6 Clicks" Mobility   Outcome Measure  Help needed turning from your back to your side while in a flat bed without using bedrails?: A Little Help needed moving from lying on your back to sitting on the side of a flat bed without using bedrails?: A Little Help needed moving to and from a bed to a chair (including a wheelchair)?: A Little Help needed standing up from a chair using your arms (e.g., wheelchair or bedside chair)?: A Lot Help needed  to walk in hospital room?: A Little Help needed climbing 3-5 steps with a railing? : Total 6 Click Score: 15    End of Session Equipment Utilized During Treatment: Gait belt Activity Tolerance: Patient tolerated treatment well Patient left: in chair;with chair alarm set;with family/visitor present Nurse Communication: Mobility status PT Visit Diagnosis: Unsteadiness on feet (R26.81);Difficulty in walking, not elsewhere classified (R26.2);Pain;Muscle weakness (generalized) (M62.81) Pain - Right/Left: Left Pain - part of body: Ankle and joints of foot     Time: 1400-1436 PT Time Calculation (min) (ACUTE ONLY): 36 min  Charges:  $Gait Training: 8-22 mins                Ramond Dial 04/22/2018, 11:35 PM   Mee Hives, PT MS Acute Rehab Dept. Number: [place] [facility] [place]

## 2018-04-22 NOTE — Progress Notes (Signed)
[place] INFECTIOUS DISEASE PROGRESS NOTE Date of Admission:  04/18/2018     ID: Victor Wise is a 67 y.o. male with diabetic foot infection Active Problems:   Diabetic ulcer of left foot ([place])   Subjective: No fevers, tolerating abx ROS  Eleven systems are reviewed and negative except per hpi  Medications:  Antibiotics Given (last 72 hours)    Date/Time Action Medication Dose Rate   04/19/18 1550 New Bag/Given   metroNIDAZOLE (FLAGYL) IVPB 500 mg 500 mg 100 mL/hr   04/19/18 1742 New Bag/Given   aztreonam (AZACTAM) 2 g in sodium chloride 0.9 % 100 mL IVPB 2 g 200 mL/hr   04/19/18 1953 Given  [mixed with 240mg  gentamicin for antibiotic beads]   vancomycin (VANCOCIN) powder 1,000 mg    04/19/18 2222 New Bag/Given   cefTAZidime (FORTAZ) 2 g in sodium chloride 0.9 % 100 mL IVPB 2 g 200 mL/hr   04/20/18 0324 New Bag/Given   metroNIDAZOLE (FLAGYL) IVPB 500 mg 500 mg 100 mL/hr   04/20/18 0458 New Bag/Given   vancomycin (VANCOCIN) 1,250 mg in sodium chloride 0.9 % 250 mL IVPB 1,250 mg 166.7 mL/hr   04/20/18 0655 New Bag/Given   cefTAZidime (FORTAZ) 2 g in sodium chloride 0.9 % 100 mL IVPB 2 g 200 mL/hr   04/20/18 1323 New Bag/Given   metroNIDAZOLE (FLAGYL) IVPB 500 mg 500 mg 100 mL/hr   04/20/18 1507 New Bag/Given   cefTAZidime (FORTAZ) 2 g in sodium chloride 0.9 % 100 mL IVPB 2 g 200 mL/hr   04/20/18 1708 New Bag/Given   vancomycin (VANCOCIN) 1,500 mg in sodium chloride 0.9 % 500 mL IVPB 1,500 mg 250 mL/hr   04/20/18 2135 New Bag/Given   metroNIDAZOLE (FLAGYL) IVPB 500 mg 500 mg 100 mL/hr   04/20/18 2258 New Bag/Given   cefTAZidime (FORTAZ) 2 g in sodium chloride 0.9 % 100 mL IVPB 2 g 200 mL/hr   04/21/18 0513 New Bag/Given   metroNIDAZOLE (FLAGYL) IVPB 500 mg 500 mg 100 mL/hr   04/21/18 8527 New Bag/Given   cefTAZidime (FORTAZ) 2 g in sodium chloride 0.9 % 100 mL IVPB 2 g 200 mL/hr   04/21/18 0841 New Bag/Given   vancomycin (VANCOCIN) 1,500 mg in sodium chloride 0.9 %  500 mL IVPB 1,500 mg 250 mL/hr   04/21/18 1111 New Bag/Given   metroNIDAZOLE (FLAGYL) IVPB 500 mg 500 mg 100 mL/hr   04/21/18 1233 New Bag/Given   cefTAZidime (FORTAZ) 2 g in sodium chloride 0.9 % 100 mL IVPB 2 g 200 mL/hr   04/21/18 1731 New Bag/Given   vancomycin (VANCOCIN) 1,500 mg in sodium chloride 0.9 % 500 mL IVPB 1,500 mg 250 mL/hr   04/21/18 2205 New Bag/Given   metroNIDAZOLE (FLAGYL) IVPB 500 mg 500 mg 100 mL/hr   04/21/18 2309 New Bag/Given   cefTAZidime (FORTAZ) 2 g in sodium chloride 0.9 % 100 mL IVPB 2 g 200 mL/hr   04/22/18 0356 New Bag/Given   metroNIDAZOLE (FLAGYL) IVPB 500 mg 500 mg 100 mL/hr   04/22/18 0603 New Bag/Given   vancomycin (VANCOCIN) 1,500 mg in sodium chloride 0.9 % 500 mL IVPB 1,500 mg 250 mL/hr   04/22/18 0810 New Bag/Given   cefTAZidime (FORTAZ) 2 g in sodium chloride 0.9 % 100 mL IVPB 2 g 200 mL/hr   04/22/18 1046 New Bag/Given   metroNIDAZOLE (FLAGYL) IVPB 500 mg 500 mg 100 mL/hr     . amLODipine  10 mg Oral Daily  . aspirin EC  81  mg Oral Daily  . benazepril  20 mg Oral BID  . DULoxetine  60 mg Oral Daily  . fenofibrate  54 mg Oral Q supper  . fluticasone  2 spray Each Nare Daily  . heparin injection (subcutaneous)  5,000 Units Subcutaneous Q8H  . hydrochlorothiazide  12.5 mg Oral Daily  . insulin aspart  0-5 Units Subcutaneous QHS  . insulin aspart  0-9 Units Subcutaneous TID WC  . insulin glargine  70 Units Subcutaneous QHS  . loratadine  10 mg Oral Daily  . omega-3 acid ethyl esters  1 g Oral BID  . OXcarbazepine  300 mg Oral q morning - 10a  . OXcarbazepine  600 mg Oral QHS  . oxyCODONE  10 mg Oral Q12H  . pantoprazole  40 mg Oral Daily  . pregabalin  200 mg Oral QHS  . pregabalin  200 mg Oral q morning - 10a  . sodium chloride flush  10-40 mL Intracatheter Q12H  . tamsulosin  0.4 mg Oral QPC supper    Objective: Vital signs in last 24 hours: Temp:  [98.3 F (36.8 C)-98.9 F (37.2 C)] 98.3 F (36.8 C) (12/08 0741) Pulse Rate:   [59-61] 60 (12/08 0741) Resp:  [14] 14 (12/08 0013) BP: (146-168)/(72-95) 168/95 (12/08 0741) SpO2:  [92 %-99 %] 99 % (12/08 0741) Constitutional: He is oriented to person, place, and time. Morbidly obese HENT: anicteric Mouth/Throat: Oropharynx is clear and moist. No oropharyngeal exudate.  Cardiovascular: Normal rate, regular rhythm and normal heart sounds. Exam reveals no gallop and no friction rub.  Pulmonary/Chest: Effort normal and breath sounds normal. No respiratory distress.  Abdominal: Soft. Bowel sounds are normal. He exhibits no distension. There is no tenderness.  Lymphadenopathy: He has no cervical adenopathy.  Neurological: He is alert and oriented to person, place, and time.  Ext 1+ edema bil Skin: L foot wrapped post op Psychiatric: He has a normal mood and affect. His behavior is normal.   Lab Results Recent Labs    04/20/18 0426 04/21/18 0600  WBC 5.0  --   HGB 9.7*  --   HCT 31.6*  --   NA 138 139  K 3.8 3.8  CL 107 106  CO2 28 29  BUN 16 15  CREATININE 0.83 0.79    Microbiology: Results for orders placed or performed during the hospital encounter of 04/18/18  Aerobic/Anaerobic Culture (surgical/deep wound)     Status: None (Preliminary result)   Collection Time: 04/19/18  8:28 AM  Result Value Ref Range Status   Specimen Description   Final    FOOT LEFT Performed at [facility], [address] [place], [place] [address]    Special Requests   Final    Normal Performed at [facility], [address] [place], [place] [address]    Gram Stain   Final    RARE WBC PRESENT, PREDOMINANTLY PMN RARE GRAM POSITIVE COCCI Performed at [facility], [place] [address] [place], [place] [address]    Culture   Final    FEW ENTEROCOCCUS FAECALIS NO ANAEROBES ISOLATED; CULTURE IN PROGRESS FOR 5 DAYS    Report Status PENDING  Incomplete   Organism ID, Bacteria ENTEROCOCCUS FAECALIS  Final      Susceptibility   Enterococcus faecalis -  MIC*    AMPICILLIN <=2 SENSITIVE Sensitive     VANCOMYCIN 1 SENSITIVE Sensitive     GENTAMICIN SYNERGY RESISTANT Resistant     * FEW ENTEROCOCCUS FAECALIS  Aerobic/Anaerobic  Culture (surgical/deep wound)     Status: None (Preliminary result)   Collection Time: 04/19/18  7:58 PM  Result Value Ref Range Status   Specimen Description   Final    BONE BX Performed at [facility], [address], [place], [place] [address]    Special Requests   Final    NONE Performed at [facility], [address] [place], [place] [address]    Gram Stain   Final    RARE WBC PRESENT, PREDOMINANTLY MONONUCLEAR RARE GRAM POSITIVE COCCI RARE GRAM NEGATIVE RODS    Culture   Final    RARE KLEBSIELLA PNEUMONIAE NO ANAEROBES ISOLATED; CULTURE IN PROGRESS FOR 5 DAYS CULTURE REINCUBATED FOR BETTER GROWTH Performed at [facility], [place] [address], [place], [place] [address]    Report Status PENDING  Incomplete   Organism ID, Bacteria KLEBSIELLA PNEUMONIAE  Final      Susceptibility   Klebsiella pneumoniae - MIC*    AMPICILLIN >=32 RESISTANT Resistant     CEFAZOLIN >=64 RESISTANT Resistant     CEFEPIME <=1 SENSITIVE Sensitive     CEFTAZIDIME 4 SENSITIVE Sensitive     CEFTRIAXONE 8 SENSITIVE Sensitive     CIPROFLOXACIN <=0.25 SENSITIVE Sensitive     GENTAMICIN <=1 SENSITIVE Sensitive     IMIPENEM <=0.25 SENSITIVE Sensitive     TRIMETH/SULFA <=20 SENSITIVE Sensitive     AMPICILLIN/SULBACTAM RESISTANT Resistant     PIP/TAZO <=4 SENSITIVE Sensitive     Extended ESBL NEGATIVE Sensitive     * RARE KLEBSIELLA PNEUMONIAE    Studies/Results: Ct Head Wo Contrast  Result Date: 04/21/2018 CLINICAL DATA:  Muscle weakness, now resolving EXAM: CT HEAD WITHOUT CONTRAST TECHNIQUE: Contiguous axial images were obtained from the base of the skull through the vertex without intravenous contrast. COMPARISON:  04/13/2016 FINDINGS: Brain: No evidence of acute infarction, hemorrhage,  hydrocephalus, extra-axial collection or mass lesion/mass effect. Subcortical white matter and periventricular small vessel ischemic changes. 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 Vascular: Intracranial atherosclerosis. Skull: Normal. Negative for fracture or focal lesion. Sinuses/Orbits: Mild mucosal thickening of the bilateral ethmoid sinuses. Mastoid air cells are clear. Other: None. IMPRESSION: No evidence of acute intracranial abnormality. 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  Small vessel ischemic changes. Electronically Signed   By: Julian Hy M.D.   On: 04/21/2018 17:51    Assessment/Plan: KALIQ LEGE is a 67 y.o. male with DM foot infection and underlying osteomyelitis. He also has multiple drug allergies. Recent admission in Oct had angio and I and D. Cultures from then grew E coli, enterococcus, Bacteroides. Was treated with IV abx while inpatient but since had surgery was felt infection was cleared and dced on oral cephalosporin.  Obviously with exposed bone has osteomyelitis - s/p surgery 12/6   12/7 remains on vanco, ceftazidime and flagyl - tolerating it well. Picc placed  Cultures pending 12/8- no fevers, tolerating abx. Cultures growing enterococcus again as well as Klebsiella  Recommendations At this point we need to cover enterococcus and the Klebsiella as well as anaerobes.  He is adamantly refusing penicillins as he said he was told by a doctor "it would kill him".   However I think we can try meropenem which will cover the enterococcus and avoid the nephrotoxicity of the vancomycin.  He is tolerating a cephalosporin fine but that will not cover the enterococcus.  Thank you very much for the consult. Will follow with you.  CHETAN MEHRING   04/22/2018,  1:22 PM

## 2018-04-23 ENCOUNTER — Encounter: Payer: Self-pay | Admitting: Podiatry

## 2018-04-23 LAB — GLUCOSE, CAPILLARY
Glucose-Capillary: 209 mg/dL — ABNORMAL HIGH (ref 70–99)
Glucose-Capillary: 259 mg/dL — ABNORMAL HIGH (ref 70–99)
Glucose-Capillary: 231 mg/dL — ABNORMAL HIGH (ref 70–99)

## 2018-04-23 LAB — CBC
HCT: 32.8 % — ABNORMAL LOW (ref 39.0–52.0)
Hemoglobin: 10.1 g/dL — ABNORMAL LOW (ref 13.0–17.0)
MCH: 24.3 pg — ABNORMAL LOW (ref 26.0–34.0)
MCHC: 30.8 g/dL (ref 30.0–36.0)
MCV: 79 fL — ABNORMAL LOW (ref 80.0–100.0)
PLATELETS: 272 10*3/uL (ref 150–400)
RBC: 4.15 MIL/uL — ABNORMAL LOW (ref 4.22–5.81)
RDW: 15.9 % — AB (ref 11.5–15.5)
WBC: 4.8 10*3/uL (ref 4.0–10.5)
nRBC: 0 % (ref 0.0–0.2)

## 2018-04-23 LAB — CREATININE, SERUM
Creatinine, Ser: 0.75 mg/dL (ref 0.61–1.24)
GFR calc Af Amer: >60 mL/min (ref >60)
GFR calc non Af Amer: >60 mL/min (ref >60)

## 2018-04-23 LAB — CK: Total CK: 14 U/L — ABNORMAL LOW (ref 49–397)

## 2018-04-23 MED ORDER — OXYCODONE HCL 5 MG PO TABS: 5.0000 mg | ORAL_TABLET | ORAL | 0 refills | Status: DC | PRN

## 2018-04-23 MED ORDER — INSULIN GLARGINE 100 UNIT/ML ~~LOC~~ SOLN: 70.0000 [IU] | Freq: Every day | SUBCUTANEOUS | 11 refills | Status: DC

## 2018-04-23 MED ORDER — OXYCODONE HCL ER 10 MG PO T12A: 10.0000 mg | EXTENDED_RELEASE_TABLET | Freq: Two times a day (BID) | ORAL | 0 refills | Status: DC

## 2018-04-23 MED ORDER — METRONIDAZOLE 500 MG PO TABS
500.0000 mg | ORAL_TABLET | Freq: Three times a day (TID) | ORAL | Status: DC
  Administered 2018-04-23: 500 mg via ORAL
  Filled 2018-04-23: qty 1

## 2018-04-23 MED ORDER — SODIUM CHLORIDE 0.9 % IV SOLN: 8.0000 mg/kg | Freq: Every day | INTRAVENOUS | 0 refills | Status: DC

## 2018-04-23 MED ORDER — CEFTRIAXONE IV (FOR PTA / DISCHARGE USE ONLY): 2.0000 g | INTRAVENOUS | 0 refills | Status: AC

## 2018-04-23 MED ORDER — ACETAMINOPHEN 325 MG PO TABS: 650.0000 mg | ORAL_TABLET | Freq: Four times a day (QID) | ORAL | Status: DC | PRN

## 2018-04-23 MED ORDER — SODIUM CHLORIDE 0.9 % IV SOLN: 2.0000 g | INTRAVENOUS | Status: DC

## 2018-04-23 MED ORDER — AMLODIPINE BESYLATE 10 MG PO TABS: 10.0000 mg | ORAL_TABLET | Freq: Every day | ORAL | 0 refills | Status: DC

## 2018-04-23 MED ORDER — SODIUM CHLORIDE 0.9 % IV SOLN
2.0000 g | INTRAVENOUS | Status: DC
  Administered 2018-04-23: 2 g via INTRAVENOUS
  Filled 2018-04-23: qty 2
  Filled 2018-04-23: qty 20

## 2018-04-23 MED ORDER — LINEZOLID 600 MG PO TABS: 600.0000 mg | ORAL_TABLET | Freq: Two times a day (BID) | ORAL | 0 refills | Status: AC

## 2018-04-23 MED ORDER — DAPTOMYCIN IV (FOR PTA / DISCHARGE USE ONLY): 750.0000 mg | INTRAVENOUS | 0 refills | Status: DC

## 2018-04-23 MED ORDER — LINEZOLID 600 MG PO TABS: 600.0000 mg | ORAL_TABLET | Freq: Two times a day (BID) | ORAL | 0 refills | Status: DC

## 2018-04-23 MED ORDER — SODIUM CHLORIDE 0.9 % IV SOLN: 2.0000 g | INTRAVENOUS | 0 refills | Status: DC

## 2018-04-23 MED ORDER — METRONIDAZOLE 500 MG PO TABS: 500.0000 mg | ORAL_TABLET | Freq: Three times a day (TID) | ORAL | 0 refills | Status: AC

## 2018-04-23 MED ORDER — SODIUM CHLORIDE 0.9 % IV SOLN
8.0000 mg/kg | Freq: Every day | INTRAVENOUS | Status: DC
  Administered 2018-04-23: 743 mg via INTRAVENOUS
  Filled 2018-04-23: qty 14.86

## 2018-04-23 NOTE — Progress Notes (Addendum)
[facility] INFECTIOUS DISEASE PROGRESS NOTE Date of Admission:  04/18/2018     ID: Victor Wise is a 67 y.o. male with diabetic foot infection Active Problems:   Diabetic ulcer of left foot (HCC)   Subjective: No fevers, tolerating  change to meropenem  ROS  Eleven systems are reviewed and negative except per hpi  Medications:  Antibiotics Given (last 72 hours)    Date/Time Action Medication Dose Rate   04/20/18 1323 New Bag/Given   metroNIDAZOLE (FLAGYL) IVPB 500 mg 500 mg 100 mL/hr   04/20/18 1507 New Bag/Given   cefTAZidime (FORTAZ) 2 g in sodium chloride 0.9 % 100 mL IVPB 2 g 200 mL/hr   04/20/18 1708 New Bag/Given   vancomycin (VANCOCIN) 1,500 mg in sodium chloride 0.9 % 500 mL IVPB 1,500 mg 250 mL/hr   04/20/18 2135 New Bag/Given   metroNIDAZOLE (FLAGYL) IVPB 500 mg 500 mg 100 mL/hr   04/20/18 2258 New Bag/Given   cefTAZidime (FORTAZ) 2 g in sodium chloride 0.9 % 100 mL IVPB 2 g 200 mL/hr   04/21/18 0513 New Bag/Given   metroNIDAZOLE (FLAGYL) IVPB 500 mg 500 mg 100 mL/hr   04/21/18 4403 New Bag/Given   cefTAZidime (FORTAZ) 2 g in sodium chloride 0.9 % 100 mL IVPB 2 g 200 mL/hr   04/21/18 0841 New Bag/Given   vancomycin (VANCOCIN) 1,500 mg in sodium chloride 0.9 % 500 mL IVPB 1,500 mg 250 mL/hr   04/21/18 1111 New Bag/Given   metroNIDAZOLE (FLAGYL) IVPB 500 mg 500 mg 100 mL/hr   04/21/18 1233 New Bag/Given   cefTAZidime (FORTAZ) 2 g in sodium chloride 0.9 % 100 mL IVPB 2 g 200 mL/hr   04/21/18 1731 New Bag/Given   vancomycin (VANCOCIN) 1,500 mg in sodium chloride 0.9 % 500 mL IVPB 1,500 mg 250 mL/hr   04/21/18 2205 New Bag/Given   metroNIDAZOLE (FLAGYL) IVPB 500 mg 500 mg 100 mL/hr   04/21/18 2309 New Bag/Given   cefTAZidime (FORTAZ) 2 g in sodium chloride 0.9 % 100 mL IVPB 2 g 200 mL/hr   04/22/18 0356 New Bag/Given   metroNIDAZOLE (FLAGYL) IVPB 500 mg 500 mg 100 mL/hr   04/22/18 0603 New Bag/Given   vancomycin (VANCOCIN) 1,500 mg in sodium chloride 0.9 % 500  mL IVPB 1,500 mg 250 mL/hr   04/22/18 0810 New Bag/Given   cefTAZidime (FORTAZ) 2 g in sodium chloride 0.9 % 100 mL IVPB 2 g 200 mL/hr   04/22/18 1046 New Bag/Given   metroNIDAZOLE (FLAGYL) IVPB 500 mg 500 mg 100 mL/hr   04/22/18 1644 New Bag/Given   meropenem (MERREM) 1 g in sodium chloride 0.9 % 100 mL IVPB 1 g 200 mL/hr   04/23/18 0132 New Bag/Given   meropenem (MERREM) 1 g in sodium chloride 0.9 % 100 mL IVPB 1 g 200 mL/hr   04/23/18 0834 New Bag/Given   meropenem (MERREM) 1 g in sodium chloride 0.9 % 100 mL IVPB 1 g 200 mL/hr     . amLODipine  10 mg Oral Daily  . aspirin EC  81 mg Oral Daily  . benazepril  20 mg Oral BID  . DULoxetine  60 mg Oral Daily  . fenofibrate  54 mg Oral Q supper  . fluticasone  2 spray Each Nare Daily  . heparin injection (subcutaneous)  5,000 Units Subcutaneous Q8H  . hydrochlorothiazide  12.5 mg Oral Daily  . insulin aspart  0-5 Units Subcutaneous QHS  . insulin aspart  0-9 Units Subcutaneous TID WC  .  insulin glargine  70 Units Subcutaneous QHS  . loratadine  10 mg Oral Daily  . omega-3 acid ethyl esters  1 g Oral BID  . OXcarbazepine  300 mg Oral q morning - 10a  . OXcarbazepine  600 mg Oral QHS  . oxyCODONE  10 mg Oral Q12H  . pantoprazole  40 mg Oral Daily  . pregabalin  200 mg Oral QHS  . pregabalin  200 mg Oral q morning - 10a  . sodium chloride flush  10-40 mL Intracatheter Q12H  . tamsulosin  0.4 mg Oral QPC supper    Objective: Vital signs in last 24 hours: Temp:  [99 F (37.2 C)-99.2 F (37.3 C)] 99.2 F (37.3 C) (12/08 2311) Pulse Rate:  [59-68] 62 (12/09 0744) Resp:  [16-20] 20 (12/09 0744) BP: (136-191)/(72-88) 136/75 (12/09 0744) SpO2:  [94 %-96 %] 96 % (12/09 0744) Constitutional: He is oriented to person, place, and time. Morbidly obese HENT: anicteric Mouth/Throat: Oropharynx is clear and moist. No oropharyngeal exudate.  Cardiovascular: Normal rate, regular rhythm and normal heart sounds. Exam reveals no gallop and no  friction rub.  Pulmonary/Chest: Effort normal and breath sounds normal. No respiratory distress.  Abdominal: Soft. Bowel sounds are normal. He exhibits no distension. There is no tenderness.  Lymphadenopathy: He has no cervical adenopathy.  Neurological: He is alert and oriented to person, place, and time.  Ext 1+ edema bil Skin: L foot wrapped post op Psychiatric: He has a normal mood and affect. His behavior is normal.   Lab Results Recent Labs    04/21/18 0600 04/23/18 0531  WBC  --  4.8  HGB  --  10.1*  HCT  --  32.8*  NA 139  --   K 3.8  --   CL 106  --   CO2 29  --   BUN 15  --   CREATININE 0.79 0.75    Microbiology: Results for orders placed or performed during the hospital encounter of 04/18/18  Aerobic/Anaerobic Culture (surgical/deep wound)     Status: None (Preliminary result)   Collection Time: 04/19/18  8:28 AM  Result Value Ref Range Status   Specimen Description   Final    FOOT LEFT Performed at [facility], [address] [place], [place] [address]    Special Requests   Final    Normal Performed at [facility], [address] [place], [place] [address]    Gram Stain   Final    RARE WBC PRESENT, PREDOMINANTLY PMN RARE GRAM POSITIVE COCCI Performed at [facility], [place] [address], [place], [place] [address]    Culture   Final    FEW ENTEROCOCCUS FAECALIS NO ANAEROBES ISOLATED; CULTURE IN PROGRESS FOR 5 DAYS    Report Status PENDING  Incomplete   Organism ID, Bacteria ENTEROCOCCUS FAECALIS  Final      Susceptibility   Enterococcus faecalis - MIC*    AMPICILLIN <=2 SENSITIVE Sensitive     VANCOMYCIN 1 SENSITIVE Sensitive     GENTAMICIN SYNERGY RESISTANT Resistant     * FEW ENTEROCOCCUS FAECALIS  Aerobic/Anaerobic Culture (surgical/deep wound)     Status: None (Preliminary result)   Collection Time: 04/19/18  7:58 PM  Result Value Ref Range Status   Specimen Description   Final    BONE BX Performed at [facility], [address] [place],  [address]    Special Requests   Final    NONE Performed at [facility],  [place], [place] [address]    Gram Stain   Final    RARE WBC PRESENT, PREDOMINANTLY MONONUCLEAR RARE GRAM POSITIVE COCCI RARE GRAM NEGATIVE RODS    Culture   Final    RARE KLEBSIELLA PNEUMONIAE FEW VANCOMYCIN RESISTANT ENTEROCOCCUS NO ANAEROBES ISOLATED; CULTURE IN PROGRESS FOR 5 DAYS CULTURE REINCUBATED FOR BETTER GROWTH Performed at [facility], [place] [address], [place], [place] [address]    Report Status PENDING  Incomplete   Organism ID, Bacteria KLEBSIELLA PNEUMONIAE  Final   Organism ID, Bacteria VANCOMYCIN RESISTANT ENTEROCOCCUS  Final      Susceptibility   Klebsiella pneumoniae - MIC*    AMPICILLIN >=32 RESISTANT Resistant     CEFAZOLIN >=64 RESISTANT Resistant     CEFEPIME <=1 SENSITIVE Sensitive     CEFTAZIDIME 4 SENSITIVE Sensitive     CEFTRIAXONE 8 SENSITIVE Sensitive     CIPROFLOXACIN <=0.25 SENSITIVE Sensitive     GENTAMICIN <=1 SENSITIVE Sensitive     IMIPENEM <=0.25 SENSITIVE Sensitive     TRIMETH/SULFA <=20 SENSITIVE Sensitive     AMPICILLIN/SULBACTAM RESISTANT Resistant     PIP/TAZO <=4 SENSITIVE Sensitive     Extended ESBL NEGATIVE Sensitive     * RARE KLEBSIELLA PNEUMONIAE   Vancomycin resistant enterococcus - MIC*    AMPICILLIN >=32 RESISTANT Resistant     VANCOMYCIN >=32 RESISTANT Resistant     GENTAMICIN SYNERGY SENSITIVE Sensitive     LINEZOLID 2 SENSITIVE Sensitive     * FEW VANCOMYCIN RESISTANT ENTEROCOCCUS    Studies/Results: Ct Head Wo Contrast  Result Date: 04/21/2018 CLINICAL DATA:  Muscle weakness, now resolving EXAM: CT HEAD WITHOUT CONTRAST TECHNIQUE: Contiguous axial images were obtained from the base of the skull through the vertex without intravenous contrast. COMPARISON:  04/13/2016 FINDINGS: Brain: No evidence of acute infarction, hemorrhage, hydrocephalus, extra-axial collection or  mass lesion/mass effect. Subcortical white matter and periventricular small vessel ischemic changes. 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 Vascular: Intracranial atherosclerosis. Skull: Normal. Negative for fracture or focal lesion. Sinuses/Orbits: Mild mucosal thickening of the bilateral ethmoid sinuses. Mastoid air cells are clear. Other: None. IMPRESSION: No evidence of acute intracranial abnormality. 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  Small vessel ischemic changes. Electronically Signed   By: Julian Hy M.D.   On: 04/21/2018 17:51    Assessment/Plan: OMAREE FUQUA is a 67 y.o. male with DM foot infection and underlying osteomyelitis. He also has multiple drug allergies. Recent admission in Oct had angio and I and D. Cultures from then grew E coli, enterococcus, Bacteroides. Was treated with IV abx while inpatient but since had surgery was felt infection was cleared and dced on oral cephalosporin.  Obviously with exposed bone has osteomyelitis - s/p surgery 12/6   12/7 remains on vanco, ceftazidime and flagyl - tolerating it well. Picc placed  Cultures pending 12/8- no fevers, tolerating abx. Cultures growing enterococcus again as well as Klebsiella 12/9 - no fevers, ready for dc. Cx with VRE from bone biopsy Recommendations At this point we need to cover enterococcus. VRE  and the Klebsiella as well as anaerobes.  He is adamantly refusing penicillins as he said he was told by a doctor "it would kill him".   Will start daptomycin for the VRE (Cannot use linezolid due to the duration of therapy > 2 weeks). Will dc meropenem and use ceftriaxone and oral flaygl for coverage of GNR and anaerobes See abx order sheet  Thank you very much for  the consult. Will follow with you.  SIERRA BISSONETTE   04/23/2018, 11:55 AM

## 2018-04-23 NOTE — Progress Notes (Signed)
Patient will D/C home on less expensive PO medications in the place of dapto per RN case manager. Please reconsult if future social work needs arise. CSW signing off.   [employer], LCSW [phone]

## 2018-04-23 NOTE — Care Management (Addendum)
Patient cannot afford IV Dapto.and ID can change to a PO linezolid 600 mg BID for 2 weeks. Cost with [facility] $106.44.  Pharmacist found for less than that at [facility]. [facility] will provide Rocephin via PICC. No other RNCM needs.

## 2018-04-23 NOTE — Progress Notes (Signed)
Patient d/c home with friend. Prescriptions and instructions given to patient. PICC line in place for IVABT.

## 2018-04-23 NOTE — Care Management (Signed)
RNCM has notified Corene Cornea with [facility] that patient anticipates discharge to home today and will need wound care and IV antibitics.  RNCM checking with MD to see if IV antibiotics are needed.  Patient said that he could obtain transportation to home. Patient says he has a PICC in place and not worried about self-administration of home antibiotics by IV.

## 2018-04-23 NOTE — Progress Notes (Addendum)
Per RN case manager and patient's daughter [name] patient has now changed his mind and is agreeable to SNF however no SNF's will likely accept him on daptomycin because of the high cost. Patient has 3 bed offers [facility], [facility] and [facility]. Per [facility] admissions coordinator at [facility] they will not accept daptomycin. CSW is waiting for [facility] and [facility] to get back with CSW.   2:19 pm update: [facility] can't accept patient due to high cost of daptomycin.    [employer], LCSW [phone]

## 2018-04-23 NOTE — Progress Notes (Signed)
Clinical Social Worker (CSW) met with patient to discuss D/C plan. Patient reported that he still wants to go home however he is concerned if he needs daily dressing changes. Per Dr. Cleda Mccreedy patient will need a dressing change 3 days per week. Patient is aware of above and has decided to go home. Patient reported that he is comfortable doing IV ABX at home because he has had a PICC line at home before. CSW made patient aware that his daughter [name] shared her concerns with Dr. Cleda Mccreedy about patient falling at home. Per patient his daughter now lives with him because she is going through a divorce. Per patient his brakes on his wheel chair were broke and that is why he was falling at home. Patient reported that his wheel chair brakes are working now and he has not had a fall since they have been fixed. Patient adamantly refused SNF multiple times and will discharge home. RN case manager aware of above. Please reconsult if future social work needs arise. CSW signing off.   [employer], LCSW [phone]

## 2018-04-23 NOTE — Progress Notes (Addendum)
Infectious Disease Long Term IV Antibiotic Orders MATIS MONNIER August 29, 1950  Diagnosis:  DM foot infection with osteomyelitis  Culture results Klebsiella, VRE  LABS Lab Results  Component Value Date   CREATININE 0.75 04/23/2018   Lab Results  Component Value Date   WBC 4.8 04/23/2018   HGB 10.1 (L) 04/23/2018   HCT 32.8 (L) 04/23/2018   MCV 79.0 (L) 04/23/2018   PLT 272 04/23/2018   Lab Results  Component Value Date   ESRSEDRATE 14 08/30/2013   No results found for: CRP  Allergies:  Allergies  Allergen Reactions  . Amoxicillin Other (See Comments)    Has never taken amoxicillin -ENT told him not to take (? Had skin test) Has patient had a PCN reaction causing immediate rash, facial/tongue/throat swelling, SOB or lightheadedness with hypotension: No Has patient had a PCN reaction causing severe rash involving mucus membranes or skin necrosis: No Has patient had a PCN reaction that required hospitalization No Has patient had a PCN reaction occurring within the last 10 years: No If above answers are "NO", then may proceed w/ Cephalo  . Other Anaphylaxis, Itching and Other (See Comments)    Pt states that he is allergic to Endopa.  Reaction:  Anaphylaxis  Pt states that he is allergic to Metabisulfites Reaction:  Itching   . Penicillins Other (See Comments)    Arm turned "blue" at injection site (no treatment needed) Has patient had a PCN reaction causing immediate rash, facial/tongue/throat swelling, SOB or lightheadedness with hypotension: No Has patient had a PCN reaction causing severe rash involving mucus membranes or skin necrosis: No Has patient had a PCN reaction that required hospitalization No Has patient had a PCN reaction occurring within the last 10 years: No If all of the above answers are "NO", then may proceed with Cephalosporin use.  [name] Hydralazine Other (See Comments)    Reaction:  Cramping of extremities Pt reports tolerating low dose 12.59m but no higher.   . Cephalexin Itching  . Crestor [Rosuvastatin Calcium] Other (See Comments)    Reaction:  Pt is unable to move arms/legs   . Metoprolol Nausea And Vomiting  . Red Dye Itching  . Statins Other (See Comments)    Reaction:  Pt is unable to move arms/legs  . Sulfa Antibiotics Itching    Other reaction(s): Unknown  . Yellow Dyes (Non-Tartrazine) Itching  . Aspirin Itching and Other (See Comments)    Pt states that he is able to use in lower doses.    . Tape Rash    Please use "paper" tape only. Please use "paper" tape only.    Discharge antibiotics Ceftriaxone 2 gm IV q 24 hours Oral linezolid 600 mg BID for 2 weeks Oral metronidazole 500 mg po q 8 hours   Due to patient not being able to afford daptomycin have no choice but to treat VRE with oral linezolid for 2 weeks.  PICC Care per protocol Labs weekly while on IV antibiotics -FAX weekly labs to [phone] w diff   Comprehensive met panel CRP   Planned duration of antibiotics 4 weeks ceftriaxone, 2 weeks linezolid with reevaluation at that time.  Stop date May 17 2018 Follow up clinic date within 2 weeks   DLeonel Ramsay MD

## 2018-04-23 NOTE — Progress Notes (Signed)
Attempted to call patient's daughter for transport and d/c home. No answer and VM full. Patient states daughter will not be able to pick him up today due to schedule conflict with work. Daughter was here but left prior to medications being arranged by MD, pharmacy, and CM. Will attempt to contact daughter again later.

## 2018-04-23 NOTE — Progress Notes (Addendum)
PHARMACY CONSULT NOTE FOR:  OUTPATIENT  PARENTERAL ANTIBIOTIC THERAPY (OPAT)  Indication: osteomyelitis of foot Regimen: , ceftriaxone 2gm IV q24h End date: 05/17/2018  IV antibiotic discharge orders are pended. To discharging provider:  please sign these orders via discharge navigator,  Select New Orders & click on the button choice - Manage This Unsigned Work.     Addendum: patient unable to afford daptomycin, can go home with linezolid x 2 weeks.  Good Rx has coupon for [facility] for $88.65 Discussed with patient he must go to [facility] to get prescription filled. Discussed how to take medication and educated on side effects and possible symptoms of serotonin syndrome.   Thank you for allowing pharmacy to be a part of this patient's care.  Doreene Eland, PharmD, BCPS.   Work Cell: [phone] 04/23/2018 12:39 PM

## 2018-04-23 NOTE — Discharge Summary (Addendum)
Victor Wise at [place] NAME: Victor Wise    MR#:  [id number]  DATE OF BIRTH:  January 05, 1951  DATE OF ADMISSION:  04/18/2018 ADMITTING PHYSICIAN: Victor Costa, MD  DATE OF DISCHARGE: 04/23/18 PRIMARY CARE PHYSICIAN: Victor Guise, MD    ADMISSION DIAGNOSIS:  osteomyolitis, cellulitis, abscess  DISCHARGE DIAGNOSIS:  Active Problems:   Diabetic ulcer of left foot ([place])  osteomyalitis SECONDARY DIAGNOSIS:   Past Medical History:  Diagnosis Date  . Allergy   . Basal cell carcinoma    forehead  . BPH (benign prostatic hyperplasia)   . Chronic back pain   . Depression   . Diabetes ([place])   . GERD (gastroesophageal reflux disease)   . Heart attack ([place])   . Hypertension   . Morbid obesity ([place])   . Obstructive sleep apnea   . Osteomyelitis of foot ([place])   . Status post insertion of spinal cord stimulator   . Stroke ([place])   . UTI (lower urinary tract infection)     HOSPITAL COURSE:   67 year old male with past medical history significant for obesity, hypertension, diabetes with neuropathy, sleep apnea, history of CVA, chronic low back pain and nonhealing left toe ulcer presents to hospital secondary for further management  1.  Cellulitis and osteomyelitis of left forefoot-nonhealing diabetic foot ulcer, peripheral arterial disease secondary to diabetes mellitus -Patient that is post debridement and left foot first toe amputation. -PICC line placed.  Appreciate ID and podiatry consults. -Currently on IV antibiotics with meropenem. (To cover enterococcus, Klebsiella and anaerobes) -Cultures from the wound collected on December 5 has revealed Enterococcus faecalis sensitive to ampicillin and vancomycin, VRE  Patient is allergic to penicillin -Physical therapy recommending skilled nursing facility but patient prefers going home with wound care and he is very comfortable giving IV antibiotics and his daughter will help him at  home -Podiatry recommended dressing changes 3 times a week -We will discharge him home with IV antibiotics per ID recommendations, recommending daptomycin 750 mg daily  for VRE, but as patient cannot afford it it was changed to linezolid 600 mg p.o. twice daily for 2 weeks, Rocephin for Klebsiella and p.o. Flagyl for anaerobic coverage for 4 weeks.  End date of antibiotics May 17, 2018; patient needs weekly labs CBC with differential, comprehensive metabolic panel, CRP and CPK and the results need to be faxed over to [phone] Linezolid 600 mg p.o. twice daily for 2 weeks Ceftriaxone 2 gm IV q 24 hours Oral metronidazole 500 mg po BID  PICC Care per protocol -Case management and social worker are involved  2.  Diabetic neuropathy-continue Lyrica and pain medications  3.  Diabetes mellitus with complications of peripheral arterial disease and diabetic infection and peripheral neuropathy -on Lantus and sliding scale insulin  4.   Hypertension-benazepril and hydrochlorothiazide    Added amlodipine for better control.   Increased dose of amlodipine.  5.  DVT prophylaxis-can start subcu heparin today  Has a wheelchair at home, ambulates with a walker - weightbearing per podiatry   recommendations   -Patient's home is handicapped equipped                   DISCHARGE CONDITIONS:   Fair  CONSULTS OBTAINED:  Treatment Team:  Victor Wise, DPM Victor Ramsay, MD   PROCEDURES  debridement and left foot first toe amputation.  DRUG ALLERGIES:   Allergies  Allergen Reactions  . Amoxicillin Other (See Comments)  Has never taken amoxicillin -ENT told him not to take (? Had skin test) Has patient had a PCN reaction causing immediate rash, facial/tongue/throat swelling, SOB or lightheadedness with hypotension: No Has patient had a PCN reaction causing severe rash involving mucus membranes or skin necrosis: No Has patient had a PCN reaction that required hospitalization  No Has patient had a PCN reaction occurring within the last 10 years: No If above answers are "NO", then may proceed w/ Cephalo  . Other Anaphylaxis, Itching and Other (See Comments)    Pt states that he is allergic to Endopa.  Reaction:  Anaphylaxis  Pt states that he is allergic to Metabisulfites Reaction:  Itching   . Penicillins Other (See Comments)    Arm turned "blue" at injection site (no treatment needed) Has patient had a PCN reaction causing immediate rash, facial/tongue/throat swelling, SOB or lightheadedness with hypotension: No Has patient had a PCN reaction causing severe rash involving mucus membranes or skin necrosis: No Has patient had a PCN reaction that required hospitalization No Has patient had a PCN reaction occurring within the last 10 years: No If all of the above answers are "NO", then may proceed with Cephalosporin use.  [name] Hydralazine Other (See Comments)    Reaction:  Cramping of extremities Pt reports tolerating low dose 12.22m but no higher.  . Cephalexin Itching  . Crestor [Rosuvastatin Calcium] Other (See Comments)    Reaction:  Pt is unable to move arms/legs   . Metoprolol Nausea And Vomiting  . Red Dye Itching  . Statins Other (See Comments)    Reaction:  Pt is unable to move arms/legs  . Sulfa Antibiotics Itching    Other reaction(s): Unknown  . Yellow Dyes (Non-Tartrazine) Itching  . Aspirin Itching and Other (See Comments)    Pt states that he is able to use in lower doses.    . Tape Rash    Please use "paper" tape only. Please use "paper" tape only.    DISCHARGE MEDICATIONS:   Allergies as of 04/23/2018      Reactions   Amoxicillin Other (See Comments)   Has never taken amoxicillin -ENT told him not to take (? Had skin test) Has patient had a PCN reaction causing immediate rash, facial/tongue/throat swelling, SOB or lightheadedness with hypotension: No Has patient had a PCN reaction causing severe rash involving mucus membranes or skin  necrosis: No Has patient had a PCN reaction that required hospitalization No Has patient had a PCN reaction occurring within the last 10 years: No If above answers are "NO", then may proceed w/ Cephalo   Other Anaphylaxis, Itching, Other (See Comments)   Pt states that he is allergic to Endopa.  Reaction:  Anaphylaxis  Pt states that he is allergic to Metabisulfites Reaction:  Itching    Penicillins Other (See Comments)   Arm turned "blue" at injection site (no treatment needed) Has patient had a PCN reaction causing immediate rash, facial/tongue/throat swelling, SOB or lightheadedness with hypotension: No Has patient had a PCN reaction causing severe rash involving mucus membranes or skin necrosis: No Has patient had a PCN reaction that required hospitalization No Has patient had a PCN reaction occurring within the last 10 years: No If all of the above answers are "NO", then may proceed with Cephalosporin use.   Hydralazine Other (See Comments)   Reaction:  Cramping of extremities Pt reports tolerating low dose 12.517mbut no higher.   Cephalexin Itching   Crestor [rosuvastatin Calcium] Other (See  Comments)   Reaction:  Pt is unable to move arms/legs    Metoprolol Nausea And Vomiting   Red Dye Itching   Statins Other (See Comments)   Reaction:  Pt is unable to move arms/legs   Sulfa Antibiotics Itching   Other reaction(s): Unknown   Yellow Dyes (non-tartrazine) Itching   Aspirin Itching, Other (See Comments)   Pt states that he is able to use in lower doses.     Tape Rash   Please use "paper" tape only. Please use "paper" tape only.      Medication List    STOP taking these medications   cefdinir 300 MG capsule Commonly known as:  OMNICEF   ciprofloxacin 500 MG tablet Commonly known as:  CIPRO   clindamycin 300 MG capsule Commonly known as:  CLEOCIN   insulin aspart 100 UNIT/ML injection Commonly known as:  novoLOG     TAKE these medications   acetaminophen 325 MG  tablet Commonly known as:  TYLENOL Take 2 tablets (650 mg total) by mouth every 6 (six) hours as needed for mild pain (or Fever >/= 101).   amLODipine 10 MG tablet Commonly known as:  NORVASC Take 1 tablet (10 mg total) by mouth daily. Start taking on:  04/24/2018   aspirin EC 81 MG tablet Take 81 mg by mouth daily.   benazepril 20 MG tablet Commonly known as:  LOTENSIN TAKE 1 TABLET BY MOUTH TWICE DAILY   cefTRIAXone  IVPB Commonly known as:  ROCEPHIN Inject 2 g into the vein daily for 23 days. Indication: osteomyelitis of foot Last Day of Therapy:  05/17/2018 Labs - Once weekly:  CBC/D and BMP, Labs - Every other week:  ESR and CRP Start taking on:  04/24/2018   clopidogrel 75 MG tablet Commonly known as:  PLAVIX Take 1 tablet (75 mg total) by mouth daily.   DULoxetine 60 MG capsule Commonly known as:  CYMBALTA Take 1 capsule (60 mg total) by mouth daily.   Exenatide ER 2 MG Pen Inject 2 mg into the skin once a week. What changed:  when to take this   fenofibrate 54 MG tablet TAKE 1 TABLET BY MOUTH A DAY AT SUPPERTIME What changed:    how much to take  how to take this  when to take this  additional instructions   fluticasone 50 MCG/ACT nasal spray Commonly known as:  FLONASE Place 2 sprays into both nostrils daily.   hydrochlorothiazide 12.5 MG tablet Commonly known as:  HYDRODIURIL Take 1 tablet (12.5 mg total) by mouth daily.   ibuprofen 400 MG tablet Commonly known as:  ADVIL,MOTRIN Take 400 mg by mouth every 6 (six) hours as needed (1-2 tablets prn).   insulin glargine 100 UNIT/ML injection Commonly known as:  LANTUS Inject 0.7 mLs (70 Units total) into the skin daily. What changed:  how much to take   Insulin Pen Needle 31G X 5 MM Misc Use as directed with insulin e11.65   insulin regular 100 units/mL injection Commonly known as:  NOVOLIN R,HUMULIN R Patient to use Novolin R [place] TiD prior to meals per sliding scale instructions. Max daily  dose is 45 units per day.   INSULIN SYRINGE 1CC/29G 29G X 1/2" 1 ML Misc Insulin injections QID, use with lantus and regular insulin. DX E11.65   Insulin Syringe-Needle U-100 31G X 5/16" 0.3 ML Misc Indulin injection QID. Use with lantus and regular insulin Dx. 11.65   linezolid 600 MG tablet Commonly known as:  ZYVOX Take 1 tablet (600 mg total) by mouth 2 (two) times daily for 14 days.   methocarbamol 500 MG tablet Commonly known as:  ROBAXIN Take 1 tablet (500 mg total) by mouth every 8 (eight) hours as needed for muscle spasms. What changed:  when to take this   metroNIDAZOLE 500 MG tablet Commonly known as:  FLAGYL Take 1 tablet (500 mg total) by mouth 3 (three) times daily for 24 days.   NARCAN 4 MG/0.1ML Liqd nasal spray kit Generic drug:  naloxone Take 4 mg by mouth as needed (for opioid overdose).   nitroGLYCERIN 0.4 MG SL tablet Commonly known as:  NITROSTAT Place 1 tablet (0.4 mg total) under the tongue every 5 (five) minutes x 3 doses as needed for chest pain.   omega-3 acid ethyl esters 1 g capsule Commonly known as:  LOVAZA Take 1 capsule (1 g total) by mouth 2 (two) times daily.   Oxcarbazepine 300 MG tablet Commonly known as:  TRILEPTAL Take 300-600 mg by mouth See admin instructions. Take 1 tablet (300MG) by mouth every morning and 2 tablets (600MG) by mouth every night   oxyCODONE 10 mg 12 hr tablet Commonly known as:  OXYCONTIN Take 1 tablet (10 mg total) by mouth every 12 (twelve) hours. What changed:  You were already taking a medication with the same name, and this prescription was added. Make sure you understand how and when to take each.   oxyCODONE 5 MG immediate release tablet Commonly known as:  Oxy IR/ROXICODONE Take 1 tablet (5 mg total) by mouth every 4 (four) hours as needed for moderate pain. What changed:  Another medication with the same name was added. Make sure you understand how and when to take each.   pantoprazole 40 MG  tablet Commonly known as:  PROTONIX TAKE 1 TABLET BY MOUTH A DAY FOR REFLUX What changed:    how much to take  how to take this  when to take this  additional instructions   polyethylene glycol packet Commonly known as:  MIRALAX / GLYCOLAX Take 17 g by mouth daily as needed.   pregabalin 200 MG capsule Commonly known as:  LYRICA Take 200 mg by mouth See admin instructions. Take 2 capsules (400MG) by mouth every morning and 1 capsule (200MG) by mouth every night   senna 8.6 MG Tabs tablet Commonly known as:  SENOKOT Take 1 tablet (8.6 mg total) by mouth 2 (two) times daily.   tamsulosin 0.4 MG Caps capsule Commonly known as:  FLOMAX Take 1 capsule (0.4 mg total) by mouth daily after supper.            Home Infusion Instuctions  (From admission, onward)         Start     Ordered   04/23/18 0000  Home infusion instructions [facility] May follow [place] Dosing Protocol; May administer Cathflo as needed to maintain patency of vascular access device.; Flushing of vascular access device: per [facility] Protocol: 0.9% NaCl pre/post medica...    Question Answer Comment  Instructions May follow [place] Dosing Protocol   Instructions May administer Cathflo as needed to maintain patency of vascular access device.   Instructions Flushing of vascular access device: per [facility] Protocol: 0.9% NaCl pre/post medication administration and prn patency; Heparin 100 u/ml, 24m for implanted ports and Heparin 10u/ml, 528mfor all other central venous catheters.   Instructions May follow [facility] Anaphylaxis Protocol for First Dose Administration in the home: 0.9% NaCl at  25-50 ml/hr to maintain IV access for protocol meds. Epinephrine 0.3 ml IV/IM PRN and Benadryl 25-50 IV/IM PRN s/s of anaphylaxis.   Instructions [facility] Infusion Coordinator (RN) to assist per patient IV care needs in the home PRN.      04/23/18 1324           Durable Medical Equipment  (From admission,  onward)         Start     Ordered   04/23/18 1327  For home use only DME Bedside commode  Once    Question:  Patient needs a bedside commode to treat with the following condition  Answer:  Weakness   04/23/18 1326           DISCHARGE INSTRUCTIONS:  Follow-up with primary care physician in 3 days Continue IV antibiotics as recommended by ID Outpatient follow-up with podiatry Dr. Cleda Mccreedy in a week Outpatient follow-up with infectious disease in 14 days/TBD   DIET:  Diabetic diet  DISCHARGE CONDITION:  Fair  ACTIVITY:  Activity as tolerated PER podiatry   OXYGEN:  Home Oxygen: No.   Oxygen Delivery: room air  DISCHARGE LOCATION:  home   If you experience worsening of your admission symptoms, develop shortness of breath, life threatening emergency, suicidal or homicidal thoughts you must seek medical attention immediately by calling 911 or calling your MD immediately  if symptoms less severe.  You Must read complete instructions/literature along with all the possible adverse reactions/side effects for all the Medicines you take and that have been prescribed to you. Take any new Medicines after you have completely understood and accpet all the possible adverse reactions/side effects.   Please note  You were cared for by a hospitalist during your hospital stay. If you have any questions about your discharge medications or the care you received while you were in the hospital after you are discharged, you can call the unit and asked to speak with the hospitalist on call if the hospitalist that took care of you is not available. Once you are discharged, your primary care physician will handle any further medical issues. Please note that NO REFILLS for any discharge medications will be authorized once you are discharged, as it is imperative that you return to your primary care physician (or establish a relationship with a primary care physician if you do not have one) for your  aftercare needs so that they can reassess your need for medications and monitor your lab values.     Today  No chief complaint on file.    ROS:  CONSTITUTIONAL: Denies fevers, chills. Has fatigue, weakness.  EYES: Denies blurry vision, double vision, eye pain. EARS, NOSE, THROAT: Denies tinnitus, ear pain, hearing loss. RESPIRATORY: Denies cough, wheeze, shortness of breath.  CARDIOVASCULAR: Denies chest pain, palpitations, edema.  GASTROINTESTINAL: Denies nausea, vomiting, diarrhea, abdominal pain. Denies bright red blood per rectum. GENITOURINARY: Denies dysuria, hematuria. ENDOCRINE: Denies nocturia or thyroid problems. HEMATOLOGIC AND LYMPHATIC: Denies easy bruising or bleeding. SKIN: Denies rash or lesion. MUSCULOSKELETAL: Chronic joint pains NEUROLOGIC: Denies paralysis, paresthesias.  PSYCHIATRIC: Denies anxiety or depressive symptoms.   VITAL SIGNS:  Blood pressure 136/75, pulse 62, temperature 99.2 F (37.3 C), temperature source Oral, resp. rate 20, height 5' 7" (1.702 m), weight 133.2 kg, SpO2 96 %.  I/O:    Intake/Output Summary (Last 24 hours) at 04/23/2018 1508 Last data filed at 04/23/2018 1344 Gross per 24 hour  Intake 1208.39 ml  Output 1100 ml  Net 108.39 ml  PHYSICAL EXAMINATION:  Physical Exam  GENERAL:  67 y.o.-year-old morbidly obese patient lying in the bed with no acute distress.  EYES: Pupils equal, round, reactive to light and accommodation. No scleral icterus. Extraocular muscles intact.  HEENT: Head atraumatic, normocephalic. Oropharynx and nasopharynx clear.  NECK:  Supple, no jugular venous distention. No thyroid enlargement, no tenderness.  LUNGS: Normal breath sounds bilaterally, no wheezing, rales,rhonchi or crepitation. No use of accessory muscles of respiration.  Decreased bibasilar breath sounds CARDIOVASCULAR: S1, S2 normal. No rubs, or gallops.  3/6 systolic murmur is present ABDOMEN: Soft, nontender, nondistended. Bowel sounds  present.  EXTREMITIES: No cyanosis, or clubbing.  1+ pedal edema.  Left foot dressing in place NEUROLOGIC: Cranial nerves II through XII are intact. Muscle strength 5/5 in all extremities. Sensation intact. Gait not checked.  PSYCHIATRIC: The patient is alert and oriented x 3.  SKIN: No obvious rash, lesion  DATA REVIEW:   CBC Recent Labs  Lab 04/23/18 0531  WBC 4.8  HGB 10.1*  HCT 32.8*  PLT 272    Chemistries  Recent Labs  Lab 04/18/18 1754  04/21/18 0600 04/23/18 0531  NA 136   < > 139  --   K 4.1   < > 3.8  --   CL 100   < > 106  --   CO2 27   < > 29  --   GLUCOSE 346*   < > 277*  --   BUN 21   < > 15  --   CREATININE 0.93   < > 0.79 0.75  CALCIUM 8.7*   < > 8.1*  --   AST 18  --   --   --   ALT 15  --   --   --   ALKPHOS 67  --   --   --   BILITOT 0.3  --   --   --    < > = values in this interval not displayed.    Cardiac Enzymes No results for input(s): TROPONINI in the last 168 hours.  Microbiology Results  Results for orders placed or performed during the hospital encounter of 04/18/18  Aerobic/Anaerobic Culture (surgical/deep wound)     Status: None (Preliminary result)   Collection Time: 04/19/18  8:28 AM  Result Value Ref Range Status   Specimen Description   Final    FOOT LEFT Performed at [facility], [address], [place], [place] [address]    Special Requests   Final    Normal Performed at [facility], [address] [place], [place] [address]    Gram Stain   Final    RARE WBC PRESENT, PREDOMINANTLY PMN RARE GRAM POSITIVE COCCI Performed at [facility], [place] [address], [place],  [address]    Culture   Final    FEW ENTEROCOCCUS FAECALIS NO ANAEROBES ISOLATED; CULTURE IN PROGRESS FOR 5 DAYS    Report Status PENDING  Incomplete   Organism ID, Bacteria ENTEROCOCCUS FAECALIS  Final      Susceptibility   Enterococcus faecalis - MIC*    AMPICILLIN <=2 SENSITIVE Sensitive     VANCOMYCIN 1 SENSITIVE  Sensitive     GENTAMICIN SYNERGY RESISTANT Resistant     * FEW ENTEROCOCCUS FAECALIS  Aerobic/Anaerobic Culture (surgical/deep wound)     Status: None (Preliminary result)   Collection Time: 04/19/18  7:58 PM  Result Value Ref Range Status   Specimen Description   Final    BONE  BX Performed at [facility], [address], [place], [place] [address]    Special Requests   Final    NONE Performed at [facility], [place], [place] [address]    Gram Stain   Final    RARE WBC PRESENT, PREDOMINANTLY MONONUCLEAR RARE GRAM POSITIVE COCCI RARE GRAM NEGATIVE RODS Performed at [facility], [place] [address] [place], [place] [address]    Culture   Final    RARE KLEBSIELLA PNEUMONIAE FEW VANCOMYCIN RESISTANT ENTEROCOCCUS NO ANAEROBES ISOLATED; CULTURE IN PROGRESS FOR 5 DAYS    Report Status PENDING  Incomplete   Organism ID, Bacteria KLEBSIELLA PNEUMONIAE  Final   Organism ID, Bacteria VANCOMYCIN RESISTANT ENTEROCOCCUS  Final      Susceptibility   Klebsiella pneumoniae - MIC*    AMPICILLIN >=32 RESISTANT Resistant     CEFAZOLIN >=64 RESISTANT Resistant     CEFEPIME <=1 SENSITIVE Sensitive     CEFTAZIDIME 4 SENSITIVE Sensitive     CEFTRIAXONE 8 SENSITIVE Sensitive     CIPROFLOXACIN <=0.25 SENSITIVE Sensitive     GENTAMICIN <=1 SENSITIVE Sensitive     IMIPENEM <=0.25 SENSITIVE Sensitive     TRIMETH/SULFA <=20 SENSITIVE Sensitive     AMPICILLIN/SULBACTAM RESISTANT Resistant     PIP/TAZO <=4 SENSITIVE Sensitive     Extended ESBL NEGATIVE Sensitive     * RARE KLEBSIELLA PNEUMONIAE   Vancomycin resistant enterococcus - MIC*    AMPICILLIN >=32 RESISTANT Resistant     VANCOMYCIN >=32 RESISTANT Resistant     GENTAMICIN SYNERGY SENSITIVE Sensitive     LINEZOLID 2 SENSITIVE Sensitive     * FEW VANCOMYCIN RESISTANT ENTEROCOCCUS    RADIOLOGY:  Ct Head Wo Contrast  Result Date: 04/21/2018 CLINICAL DATA:  Muscle weakness, now resolving EXAM: CT HEAD  WITHOUT CONTRAST TECHNIQUE: Contiguous axial images were obtained from the base of the skull through the vertex without intravenous contrast. COMPARISON:  04/13/2016 FINDINGS: Brain: No evidence of acute infarction, hemorrhage, hydrocephalus, extra-axial collection or mass lesion/mass effect. Subcortical white matter and periventricular small vessel ischemic changes. 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 Vascular: Intracranial atherosclerosis. Skull: Normal. Negative for fracture or focal lesion. Sinuses/Orbits: Mild mucosal thickening of the bilateral ethmoid sinuses. Mastoid air cells are clear. Other: None. IMPRESSION: No evidence of acute intracranial abnormality. 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  Small vessel ischemic changes. Electronically Signed   By: Julian Hy M.D.   On: 04/21/2018 17:51   [place] Ekg Site Rite  Result Date: 04/20/2018 If Site Rite image not attached, placement could not be confirmed due to current cardiac rhythm.   EKG:   Orders placed or performed during the hospital encounter of 10/26/15  . EKG 12-Lead  . EKG 12-Lead  . EKG 12-Lead in am (before 8am)  . EKG 12-Lead in am (before 8am)  . EKG      Management plans discussed with the patient, family and they are in agreement.  CODE STATUS:     Code Status Orders  (From admission, onward)         Start     Ordered   04/18/18 1722  Full code  Continuous     04/18/18 1725        Code Status History    Date Active Date Inactive Code Status Order ID Comments User Context   03/01/2018 2323 03/13/2018 2210 Full Code [id number]  Lance Coon, MD Inpatient   02/18/2016 1628 02/21/2016 1556 Full Code [id number]  Theodoro Grist, MD ED   10/26/2015 2111 11/03/2015 1616 Full Code [id number]  Vaughan Basta, MD Inpatient   10/19/2015 2200 10/21/2015 2351 Full Code [id number]  Gladstone Lighter, MD Inpatient   12/19/2014 2131 12/21/2014 1710 Full Code [id number]  Charolette Forward, MD Inpatient    12/19/2014 2123 12/19/2014 2131 Full Code [id number]  Charolette Forward, MD Inpatient   12/19/2014 1238 12/19/2014 1718 Full Code [id number]  Dionisio Tayshon, MD Inpatient   12/18/2014 1836 12/19/2014 1238 Full Code [id number]  Henreitta Leber, MD Inpatient      TOTAL TIME TAKING CARE OF THIS PATIENT: 45  minutes.   Note: This dictation was prepared with Dragon dictation along with smaller phrase technology. Any transcriptional errors that result from this process are unintentional.   _0 @  on 04/23/2018 at 3:08 PM  Between 7am to 6pm - Pager - [phone]  After 6pm go to [email or web address] - password [email or web address] [facility]  [place] [facility]  Office  [phone]  CC: Primary care physician; Victor Guise, MD

## 2018-04-23 NOTE — Plan of Care (Signed)

## 2018-04-23 NOTE — Progress Notes (Signed)
Physical Therapy Treatment Patient Details Name: Victor Wise MRN: [id number] DOB: 11-May-1951 Today's Date: 04/23/2018    History of Present Illness Pt is 67 yo male s/p L great toe amputation and I&D 04/19/2018. PMH of HTN, former smoker, past MI, CHF, CVA, depression, reanl disease, DM II, pacemaker, spinal cord stimulator    PT Comments    Patient agreeable to walk. Reports he is feeling better today. Required no physical assist to perform supine to sit this visit. CGA for sit to stand from bed. Ambulated 85 feet with rw, supervision to cga. Post op shoes donned bilaterally for walking with cues for heel weight bearing only on left. Patient demonstrates good safety awareness and transferred sit><stand from toilet independently. Performed all toileting hygiene independently.  Patient will continue to benefit from skilled PT while at [facility] to improve strength and endurance.          Follow Up Recommendations  Home health PT     Equipment Recommendations  None recommended by PT    Recommendations for Other Services       Precautions / Restrictions Precautions Precautions: Fall Precaution Comments: post op shoe, pressure through L heel only Other Brace: ortho shoe R foot Restrictions Weight Bearing Restrictions: Yes LLE Weight Bearing: Non weight bearing Other Position/Activity Restrictions: wgt through L heel only    Mobility  Bed Mobility Overal bed mobility: Modified Independent Bed Mobility: Supine to Sit     Supine to sit: Supervision     General bed mobility comments: used rails to assist  Transfers Overall transfer level: Needs assistance Equipment used: Rolling walker (2 wheeled);1 person hand held assist Transfers: Sit to/from Stand Sit to Stand: Min guard;Modified independent (Device/Increase time)            Ambulation/Gait Ambulation/Gait assistance: Min guard Gait Distance (Feet): 85 Feet Assistive device: Rolling walker (2 wheeled) Gait  Pattern/deviations: WFL(Within Functional Limits);Wide base of support Gait velocity: decreased       Stairs             Wheelchair Mobility    Modified Rankin (Stroke Patients Only)       Balance Overall balance assessment: Needs assistance Sitting-balance support: Feet supported Sitting balance-Leahy Scale: Good     Standing balance support: Bilateral upper extremity supported;During functional activity Standing balance-Leahy Scale: Good                              Cognition Arousal/Alertness: Awake/alert Behavior During Therapy: WFL for tasks assessed/performed Overall Cognitive Status: Within Functional Limits for tasks assessed                                        Exercises      General Comments        Pertinent Vitals/Pain Pain Assessment: No/denies pain    Home Living                      Prior Function            PT Goals (current goals can now be found in the care plan section) Acute Rehab PT Goals Patient Stated Goal: to return home with assist from daughter at discharge PT Goal Formulation: With patient Time For Goal Achievement: 05/06/18 Potential to Achieve Goals: Good Progress towards PT goals: Progressing toward goals  Frequency    7X/week      PT Plan Discharge plan needs to be updated    Co-evaluation              AM-PAC PT "6 Clicks" Mobility   Outcome Measure  Help needed turning from your back to your side while in a flat bed without using bedrails?: A Little Help needed moving from lying on your back to sitting on the side of a flat bed without using bedrails?: A Little Help needed moving to and from a bed to a chair (including a wheelchair)?: A Little Help needed standing up from a chair using your arms (e.g., wheelchair or bedside chair)?: A Little Help needed to walk in hospital room?: A Little Help needed climbing 3-5 steps with a railing? : A Little 6 Click  Score: 18    End of Session Equipment Utilized During Treatment: Gait belt Activity Tolerance: Patient tolerated treatment well;No increased pain Patient left: in chair;with chair alarm set;with call bell/phone within reach   PT Visit Diagnosis: Muscle weakness (generalized) (M62.81);Difficulty in walking, not elsewhere classified (R26.2)     Time: 1010-1040 PT Time Calculation (min) (ACUTE ONLY): 30 min  Charges:  $Gait Training: 8-22 mins $Therapeutic Activity: 8-22 mins                     Dorissa Stinnette, PT, GCS 04/23/18,11:02 AM

## 2018-04-23 NOTE — Care Management (Addendum)
IV antibiotics per Dr. Donette Larry pharmacist.  Wound care as follows: Plan: Sterile dry dressing reapplied to the left foot.  Discussed with the patient that he should be stable for discharge tomorrow.  Discussed going to skilled nursing for his antibiotic and wound management.  Patient will need dressing changes probably 3 times a week at this point.  Just a dry bulky bandage.  Follow-up outpatient in 2 weeks. Home health order requested from MD. Bedside commode requested also.

## 2018-04-23 NOTE — Care Management Important Message (Signed)
Important Message  Patient Details  Name: JOSIEL GAHM MRN: [id number] Date of Birth: 1950-12-31   Medicare Important Message Given:  Yes    Juliann Pulse A Maggi Hershkowitz 04/23/2018, 9:51 AM

## 2018-04-23 NOTE — Discharge Instructions (Signed)
Follow-up with primary care physician in 3 days Continue IV antibiotics as recommended by ID Outpatient follow-up with podiatry Dr. Cleda Mccreedy in a week Outpatient follow-up with infectious disease in 10 days

## 2018-04-23 NOTE — Consult Note (Signed)
Pharmacy Antibiotic Note  Victor Wise is a 67 y.o. male admitted on 04/18/2018 with Osteomyelitis.  Pharmacy has been consulted for daptomycin dosing.  Plan: Daptomycin 8 mg/kg once daily using adjusted body weight. Daptomycin 743 mg IV once daily.  Height: 5\' 7"  (170.2 cm) Weight: 293 lb 9.6 oz (133.2 kg) IBW/kg (Calculated) : 66.1  Temp (24hrs), Avg:99.1 F (37.3 C), Min:99 F (37.2 C), Max:99.2 F (37.3 C)  Recent Labs  Lab 04/18/18 1754 04/19/18 0330 04/20/18 0426 04/21/18 0600 04/23/18 0531  WBC 5.9 4.4 5.0  --  4.8  CREATININE 0.93 0.84 0.83 0.79 0.75    Estimated Creatinine Clearance: 119.4 mL/min (by C-G formula based on SCr of 0.75 mg/dL).    Allergies  Allergen Reactions  . Amoxicillin Other (See Comments)    Has never taken amoxicillin -ENT told him not to take (? Had skin test) Has patient had a PCN reaction causing immediate rash, facial/tongue/throat swelling, SOB or lightheadedness with hypotension: No Has patient had a PCN reaction causing severe rash involving mucus membranes or skin necrosis: No Has patient had a PCN reaction that required hospitalization No Has patient had a PCN reaction occurring within the last 10 years: No If above answers are "NO", then may proceed w/ Cephalo  . Other Anaphylaxis, Itching and Other (See Comments)    Pt states that he is allergic to Endopa.  Reaction:  Anaphylaxis  Pt states that he is allergic to Metabisulfites Reaction:  Itching   . Penicillins Other (See Comments)    Arm turned "blue" at injection site (no treatment needed) Has patient had a PCN reaction causing immediate rash, facial/tongue/throat swelling, SOB or lightheadedness with hypotension: No Has patient had a PCN reaction causing severe rash involving mucus membranes or skin necrosis: No Has patient had a PCN reaction that required hospitalization No Has patient had a PCN reaction occurring within the last 10 years: No If all of the above answers are  "NO", then may proceed with Cephalosporin use.  [name] Hydralazine Other (See Comments)    Reaction:  Cramping of extremities Pt reports tolerating low dose 12.5mg  but no higher.  . Cephalexin Itching  . Crestor [Rosuvastatin Calcium] Other (See Comments)    Reaction:  Pt is unable to move arms/legs   . Metoprolol Nausea And Vomiting  . Red Dye Itching  . Statins Other (See Comments)    Reaction:  Pt is unable to move arms/legs  . Sulfa Antibiotics Itching    Other reaction(s): Unknown  . Yellow Dyes (Non-Tartrazine) Itching  . Aspirin Itching and Other (See Comments)    Pt states that he is able to use in lower doses.    . Tape Rash    Please use "paper" tape only. Please use "paper" tape only.    Antimicrobials this admission: Ceftazidime 12/5 >> 12/8 Merrem 12/8 >> 12/9 Flagyl 12/4>>12/8 Vanco 12/4>>12/8 Ceftriaxone 12/9 >> Daptomycin 12/9 >> PO metronidazole 12/9 >>  Dose adjustments this admission:   Microbiology results: 12/5 Bone biopsy Culture: Rare Klesiella pneumoniae, VRE 12/5 Wound Culture: enterococcus faecalis  UCx:    Sputum:    MRSA PCR:   Thank you for allowing pharmacy to be a part of this patient's care.  Forrest Moron, PharmD Clinical Pharmacist 04/23/2018 12:11 PM

## 2018-04-23 NOTE — Progress Notes (Signed)
Inpatient Diabetes Program Recommendations  AACE/ADA: New Consensus Statement on Inpatient Glycemic Control (2015)  Target Ranges:  Prepandial:   less than 140 mg/dL      Peak postprandial:   less than 180 mg/dL (1-2 hours)      Critically ill patients:  140 - 180 mg/dL   Results for DERRAL, COLUCCI (MRN [id number]) as of 04/23/2018 11:25  Ref. Range 04/22/2018 07:43 04/22/2018 11:42 04/22/2018 16:45 04/22/2018 21:23  Glucose-Capillary Latest Ref Range: 70 - 99 mg/dL 198 (H)  2 units NOVOLOG  197 (H)  2 units NOVOLOG  239 (H)  3 units NOVOLOG  316 (H)  4 units NOVOLOG +  70 units LANTUS   Results for MARGARITA, BOBROWSKI (MRN [id number]) as of 04/23/2018 11:25  Ref. Range 04/23/2018 07:43  Glucose-Capillary Latest Ref Range: 70 - 99 mg/dL 209 (H)  3 units NOVOLOG     Home DM Meds: Lantus 60 units QHS       Regular Insulin TID per SSI       Bydureon 2 mg QWednesday  Current: Orders: Lantus 70 units QHS       Novolog Sensitive Correction Scale/ SSI (0-9 units) TID AC + HS       MD- Please consider the following in-hospital insulin adjustments:  1. Increase Lantus to 75 units QHS  2. Start Novolog Meal Coverage: Novolog 4 units TID with meals   (Please add the following Hold Parameters: Hold if pt eats <50% of meal, Hold if pt NPO)     --Will follow patient during hospitalization--  Wyn Quaker RN, MSN, CDE Diabetes Coordinator Inpatient Glycemic Control Team Team Pager: [phone] (8a-5p)

## 2018-04-24 ENCOUNTER — Telehealth: Payer: Self-pay | Admitting: Licensed Clinical Social Worker

## 2018-04-24 DIAGNOSIS — L089 Local infection of the skin and subcutaneous tissue, unspecified: Secondary | ICD-10-CM | POA: Diagnosis not present

## 2018-04-24 DIAGNOSIS — I639 Cerebral infarction, unspecified: Secondary | ICD-10-CM | POA: Diagnosis not present

## 2018-04-24 DIAGNOSIS — F341 Dysthymic disorder: Secondary | ICD-10-CM | POA: Diagnosis not present

## 2018-04-24 DIAGNOSIS — N179 Acute kidney failure, unspecified: Secondary | ICD-10-CM | POA: Diagnosis not present

## 2018-04-24 DIAGNOSIS — E11621 Type 2 diabetes mellitus with foot ulcer: Secondary | ICD-10-CM | POA: Diagnosis not present

## 2018-04-24 DIAGNOSIS — E11628 Type 2 diabetes mellitus with other skin complications: Secondary | ICD-10-CM | POA: Diagnosis not present

## 2018-04-24 DIAGNOSIS — M549 Dorsalgia, unspecified: Secondary | ICD-10-CM | POA: Diagnosis not present

## 2018-04-24 DIAGNOSIS — C44319 Basal cell carcinoma of skin of other parts of face: Secondary | ICD-10-CM | POA: Diagnosis not present

## 2018-04-24 DIAGNOSIS — M62838 Other muscle spasm: Secondary | ICD-10-CM | POA: Diagnosis not present

## 2018-04-24 DIAGNOSIS — R262 Difficulty in walking, not elsewhere classified: Secondary | ICD-10-CM | POA: Diagnosis not present

## 2018-04-24 DIAGNOSIS — G464 Cerebellar stroke syndrome: Secondary | ICD-10-CM | POA: Diagnosis not present

## 2018-04-24 DIAGNOSIS — L03116 Cellulitis of left lower limb: Secondary | ICD-10-CM | POA: Diagnosis not present

## 2018-04-24 DIAGNOSIS — L97521 Non-pressure chronic ulcer of other part of left foot limited to breakdown of skin: Secondary | ICD-10-CM | POA: Diagnosis not present

## 2018-04-24 DIAGNOSIS — I10 Essential (primary) hypertension: Secondary | ICD-10-CM | POA: Diagnosis not present

## 2018-04-24 DIAGNOSIS — E1169 Type 2 diabetes mellitus with other specified complication: Secondary | ICD-10-CM | POA: Diagnosis not present

## 2018-04-24 DIAGNOSIS — I959 Hypotension, unspecified: Secondary | ICD-10-CM | POA: Diagnosis not present

## 2018-04-24 DIAGNOSIS — M6281 Muscle weakness (generalized): Secondary | ICD-10-CM | POA: Diagnosis not present

## 2018-04-24 DIAGNOSIS — Z95820 Peripheral vascular angioplasty status with implants and grafts: Secondary | ICD-10-CM | POA: Diagnosis not present

## 2018-04-24 LAB — AEROBIC/ANAEROBIC CULTURE W GRAM STAIN (SURGICAL/DEEP WOUND): Special Requests: NORMAL

## 2018-04-24 LAB — SURGICAL PATHOLOGY

## 2018-04-24 NOTE — Telephone Encounter (Signed)
Yes we had to change his meds around due to new culture results but he is not on keflex.   He is on ceftriaxone IV, oral linezolid and oral flagyl.  Can you call [facility] to see if their nurse can assess him and see which one he is reacting to. Thanks

## 2018-04-24 NOTE — Telephone Encounter (Signed)
error 

## 2018-04-24 NOTE — Telephone Encounter (Signed)
I spoke with Coretta with [facility] and she states that nursing is scheduled to go to patients home at 2 pm and the medications will arrive at 1. I also contacted patient's daughter to let her know that he sounded very confused and frustrated over the process and has taken other medication that caused him to have a reaction.

## 2018-04-24 NOTE — Telephone Encounter (Signed)
Patient was d/c from [facility] yesterday, called to scheduled a f/u appointment with Dr. Delaine Lame for 2 weeks. Patient states that he had an allergic reaction to Keflex, he states that he took it last night before bed and was itching all night. Patient seems confused about what medication he should be taking when it comes to his iv abx. He states that people are "switching his medicines around, and coming to his home" he stated he doesn't understand what's going on. I will forward to Dr. Ola Spurr and check back with the patient

## 2018-04-25 ENCOUNTER — Telehealth: Payer: Self-pay

## 2018-04-25 DIAGNOSIS — E1169 Type 2 diabetes mellitus with other specified complication: Secondary | ICD-10-CM | POA: Diagnosis not present

## 2018-04-25 DIAGNOSIS — I10 Essential (primary) hypertension: Secondary | ICD-10-CM | POA: Diagnosis not present

## 2018-04-25 DIAGNOSIS — L97521 Non-pressure chronic ulcer of other part of left foot limited to breakdown of skin: Secondary | ICD-10-CM | POA: Diagnosis not present

## 2018-04-25 DIAGNOSIS — Z95820 Peripheral vascular angioplasty status with implants and grafts: Secondary | ICD-10-CM | POA: Diagnosis not present

## 2018-04-25 DIAGNOSIS — M549 Dorsalgia, unspecified: Secondary | ICD-10-CM | POA: Diagnosis not present

## 2018-04-25 DIAGNOSIS — L03116 Cellulitis of left lower limb: Secondary | ICD-10-CM | POA: Diagnosis not present

## 2018-04-25 DIAGNOSIS — E11621 Type 2 diabetes mellitus with foot ulcer: Secondary | ICD-10-CM | POA: Diagnosis not present

## 2018-04-25 DIAGNOSIS — M62838 Other muscle spasm: Secondary | ICD-10-CM | POA: Diagnosis not present

## 2018-04-25 DIAGNOSIS — F341 Dysthymic disorder: Secondary | ICD-10-CM | POA: Diagnosis not present

## 2018-04-25 NOTE — Telephone Encounter (Signed)
Victor Wise from [facility] called for verbal agreement . gaver verbal agreement per [facility] for physical therapy: 1 wk for 1 wk 2wks for 3 wks 1wk for 1wk

## 2018-04-26 ENCOUNTER — Ambulatory Visit: Payer: Self-pay | Admitting: Adult Health

## 2018-04-27 ENCOUNTER — Telehealth: Payer: Self-pay

## 2018-04-27 ENCOUNTER — Encounter (INDEPENDENT_AMBULATORY_CARE_PROVIDER_SITE_OTHER): Payer: Self-pay

## 2018-04-27 DIAGNOSIS — L03116 Cellulitis of left lower limb: Secondary | ICD-10-CM | POA: Diagnosis not present

## 2018-04-27 DIAGNOSIS — M549 Dorsalgia, unspecified: Secondary | ICD-10-CM | POA: Diagnosis not present

## 2018-04-27 DIAGNOSIS — Z95820 Peripheral vascular angioplasty status with implants and grafts: Secondary | ICD-10-CM | POA: Diagnosis not present

## 2018-04-27 DIAGNOSIS — L97521 Non-pressure chronic ulcer of other part of left foot limited to breakdown of skin: Secondary | ICD-10-CM | POA: Diagnosis not present

## 2018-04-27 DIAGNOSIS — E11621 Type 2 diabetes mellitus with foot ulcer: Secondary | ICD-10-CM | POA: Diagnosis not present

## 2018-04-27 DIAGNOSIS — E1169 Type 2 diabetes mellitus with other specified complication: Secondary | ICD-10-CM | POA: Diagnosis not present

## 2018-04-27 DIAGNOSIS — I10 Essential (primary) hypertension: Secondary | ICD-10-CM | POA: Diagnosis not present

## 2018-04-27 DIAGNOSIS — F341 Dysthymic disorder: Secondary | ICD-10-CM | POA: Diagnosis not present

## 2018-04-27 DIAGNOSIS — M62838 Other muscle spasm: Secondary | ICD-10-CM | POA: Diagnosis not present

## 2018-04-27 NOTE — Telephone Encounter (Signed)
Ester from [facility] called stating that the pt refused OT evaluation stated that he did not need therapy. Pt stated that there has been no changes, he have recently had a hospital visit but no changes  Meryl Crutch [phone]

## 2018-04-30 ENCOUNTER — Telehealth: Payer: Self-pay

## 2018-04-30 DIAGNOSIS — E1169 Type 2 diabetes mellitus with other specified complication: Secondary | ICD-10-CM | POA: Diagnosis not present

## 2018-04-30 DIAGNOSIS — M86172 Other acute osteomyelitis, left ankle and foot: Secondary | ICD-10-CM | POA: Diagnosis not present

## 2018-04-30 DIAGNOSIS — F341 Dysthymic disorder: Secondary | ICD-10-CM | POA: Diagnosis not present

## 2018-04-30 DIAGNOSIS — I10 Essential (primary) hypertension: Secondary | ICD-10-CM | POA: Diagnosis not present

## 2018-04-30 DIAGNOSIS — Z95820 Peripheral vascular angioplasty status with implants and grafts: Secondary | ICD-10-CM | POA: Diagnosis not present

## 2018-04-30 DIAGNOSIS — L97521 Non-pressure chronic ulcer of other part of left foot limited to breakdown of skin: Secondary | ICD-10-CM | POA: Diagnosis not present

## 2018-04-30 DIAGNOSIS — M549 Dorsalgia, unspecified: Secondary | ICD-10-CM | POA: Diagnosis not present

## 2018-04-30 DIAGNOSIS — B961 Klebsiella pneumoniae [K. pneumoniae] as the cause of diseases classified elsewhere: Secondary | ICD-10-CM | POA: Diagnosis not present

## 2018-04-30 DIAGNOSIS — E11621 Type 2 diabetes mellitus with foot ulcer: Secondary | ICD-10-CM | POA: Diagnosis not present

## 2018-04-30 DIAGNOSIS — M62838 Other muscle spasm: Secondary | ICD-10-CM | POA: Diagnosis not present

## 2018-04-30 DIAGNOSIS — E1151 Type 2 diabetes mellitus with diabetic peripheral angiopathy without gangrene: Secondary | ICD-10-CM | POA: Diagnosis not present

## 2018-04-30 DIAGNOSIS — L03116 Cellulitis of left lower limb: Secondary | ICD-10-CM | POA: Diagnosis not present

## 2018-04-30 DIAGNOSIS — L97511 Non-pressure chronic ulcer of other part of right foot limited to breakdown of skin: Secondary | ICD-10-CM | POA: Diagnosis not present

## 2018-04-30 DIAGNOSIS — B351 Tinea unguium: Secondary | ICD-10-CM | POA: Diagnosis not present

## 2018-04-30 NOTE — Telephone Encounter (Signed)
Nimisha called PT and patient to discuss and patient did not pick up the telephone. Will call again in the morning. Advise to be seen in ER, especially with history of MI and CVA.

## 2018-04-30 NOTE — Telephone Encounter (Signed)
lmom to physical therapy and pt that he need to go to hospital for fall

## 2018-05-01 ENCOUNTER — Telehealth: Payer: Self-pay

## 2018-05-01 NOTE — Telephone Encounter (Signed)
Spoke with pt that he slipped  because of his shoe he was on the floor all night and when Physical therapy came he was on the floor he called 911 then EMS came took his bp was high and sugar was 339 and they help him to get up he took his med and he feels ok and his BP was going down and sugar so he refused go to hospital he was not injured I advised pt as per heather if his bp and sugar keep checking and if its high he need to been seen by cardiologist or  here for sugar

## 2018-05-01 NOTE — Telephone Encounter (Signed)
Any follow up on him. Any follow up labs to review? Thanks

## 2018-05-02 ENCOUNTER — Telehealth: Payer: Self-pay

## 2018-05-02 ENCOUNTER — Other Ambulatory Visit: Payer: Self-pay

## 2018-05-02 ENCOUNTER — Encounter: Payer: Self-pay | Admitting: Emergency Medicine

## 2018-05-02 ENCOUNTER — Emergency Department
Admission: EM | Admit: 2018-05-02 | Discharge: 2018-05-02 | Disposition: A | Discharge status: Home / Self Care | Payer: Medicare HMO | Attending: Emergency Medicine | Admitting: Emergency Medicine

## 2018-05-02 DIAGNOSIS — E1165 Type 2 diabetes mellitus with hyperglycemia: Secondary | ICD-10-CM | POA: Insufficient documentation

## 2018-05-02 DIAGNOSIS — Z7902 Long term (current) use of antithrombotics/antiplatelets: Secondary | ICD-10-CM | POA: Diagnosis not present

## 2018-05-02 DIAGNOSIS — Z87891 Personal history of nicotine dependence: Secondary | ICD-10-CM | POA: Insufficient documentation

## 2018-05-02 DIAGNOSIS — Z95820 Peripheral vascular angioplasty status with implants and grafts: Secondary | ICD-10-CM | POA: Diagnosis not present

## 2018-05-02 DIAGNOSIS — R197 Diarrhea, unspecified: Secondary | ICD-10-CM | POA: Diagnosis not present

## 2018-05-02 DIAGNOSIS — E11621 Type 2 diabetes mellitus with foot ulcer: Secondary | ICD-10-CM | POA: Diagnosis not present

## 2018-05-02 DIAGNOSIS — L03116 Cellulitis of left lower limb: Secondary | ICD-10-CM | POA: Diagnosis not present

## 2018-05-02 DIAGNOSIS — I5032 Chronic diastolic (congestive) heart failure: Secondary | ICD-10-CM | POA: Insufficient documentation

## 2018-05-02 DIAGNOSIS — Z7982 Long term (current) use of aspirin: Secondary | ICD-10-CM | POA: Diagnosis not present

## 2018-05-02 DIAGNOSIS — R739 Hyperglycemia, unspecified: Secondary | ICD-10-CM

## 2018-05-02 DIAGNOSIS — Z79899 Other long term (current) drug therapy: Secondary | ICD-10-CM | POA: Insufficient documentation

## 2018-05-02 DIAGNOSIS — Z950 Presence of cardiac pacemaker: Secondary | ICD-10-CM | POA: Insufficient documentation

## 2018-05-02 DIAGNOSIS — Z8673 Personal history of transient ischemic attack (TIA), and cerebral infarction without residual deficits: Secondary | ICD-10-CM | POA: Diagnosis not present

## 2018-05-02 DIAGNOSIS — Z85828 Personal history of other malignant neoplasm of skin: Secondary | ICD-10-CM | POA: Diagnosis not present

## 2018-05-02 DIAGNOSIS — E1169 Type 2 diabetes mellitus with other specified complication: Secondary | ICD-10-CM | POA: Diagnosis not present

## 2018-05-02 DIAGNOSIS — F341 Dysthymic disorder: Secondary | ICD-10-CM | POA: Diagnosis not present

## 2018-05-02 DIAGNOSIS — M549 Dorsalgia, unspecified: Secondary | ICD-10-CM | POA: Diagnosis not present

## 2018-05-02 DIAGNOSIS — I10 Essential (primary) hypertension: Secondary | ICD-10-CM | POA: Diagnosis not present

## 2018-05-02 DIAGNOSIS — I110 Hypertensive heart disease with heart failure: Secondary | ICD-10-CM | POA: Diagnosis not present

## 2018-05-02 DIAGNOSIS — M62838 Other muscle spasm: Secondary | ICD-10-CM | POA: Diagnosis not present

## 2018-05-02 DIAGNOSIS — Z794 Long term (current) use of insulin: Secondary | ICD-10-CM | POA: Diagnosis not present

## 2018-05-02 DIAGNOSIS — L97521 Non-pressure chronic ulcer of other part of left foot limited to breakdown of skin: Secondary | ICD-10-CM | POA: Diagnosis not present

## 2018-05-02 LAB — GLUCOSE, CAPILLARY
Glucose-Capillary: 234 mg/dL — ABNORMAL HIGH (ref 70–99)
Glucose-Capillary: 103 mg/dL — ABNORMAL HIGH (ref 70–99)

## 2018-05-02 LAB — CBC WITH DIFFERENTIAL/PLATELET
Abs Immature Granulocytes: 0.02 10*3/uL (ref 0.00–0.07)
Basophils Absolute: 0.1 10*3/uL (ref 0.0–0.1)
Basophils Relative: 2 %
EOS ABS: 0.3 10*3/uL (ref 0.0–0.5)
Eosinophils Relative: 5 %
HCT: 36.7 % — ABNORMAL LOW (ref 39.0–52.0)
Hemoglobin: 11.4 g/dL — ABNORMAL LOW (ref 13.0–17.0)
Immature Granulocytes: 0 %
Lymphocytes Relative: 40 %
Lymphs Abs: 2.3 10*3/uL (ref 0.7–4.0)
MCH: 24.1 pg — ABNORMAL LOW (ref 26.0–34.0)
MCHC: 31.1 g/dL (ref 30.0–36.0)
MCV: 77.4 fL — ABNORMAL LOW (ref 80.0–100.0)
Monocytes Absolute: 0.5 10*3/uL (ref 0.1–1.0)
Monocytes Relative: 8 %
Neutro Abs: 2.6 10*3/uL (ref 1.7–7.7)
Neutrophils Relative %: 45 %
Platelets: 333 10*3/uL (ref 150–400)
RBC: 4.74 MIL/uL (ref 4.22–5.81)
RDW: 15.7 % — AB (ref 11.5–15.5)
WBC: 5.7 10*3/uL (ref 4.0–10.5)
nRBC: 0 % (ref 0.0–0.2)

## 2018-05-02 LAB — COMPREHENSIVE METABOLIC PANEL
ALT: 15 U/L (ref 0–44)
AST: 17 U/L (ref 15–41)
Albumin: 4 g/dL (ref 3.5–5.0)
Alkaline Phosphatase: 61 U/L (ref 38–126)
Anion gap: 7 (ref 5–15)
BUN: 21 mg/dL (ref 8–23)
CO2: 31 mmol/L (ref 22–32)
Calcium: 9.4 mg/dL (ref 8.9–10.3)
Chloride: 100 mmol/L (ref 98–111)
Creatinine, Ser: 1.01 mg/dL (ref 0.61–1.24)
GFR calc non Af Amer: >60 mL/min (ref >60)
Glucose, Bld: 207 mg/dL — ABNORMAL HIGH (ref 70–99)
POTASSIUM: 4.2 mmol/L (ref 3.5–5.1)
Sodium: 138 mmol/L (ref 135–145)
TOTAL PROTEIN: 7.6 g/dL (ref 6.5–8.1)
Total Bilirubin: 0.2 mg/dL — ABNORMAL LOW (ref 0.3–1.2)

## 2018-05-02 NOTE — ED Provider Notes (Signed)
[facility] Emergency Department Provider Note   ____________________________________________   First MD Initiated Contact with Patient 05/02/18 2045     (approximate)  I have reviewed the triage vital signs and the nursing notes.   HISTORY  Chief Complaint Hyperglycemia and Diarrhea    HPI Victor Wise is a 67 y.o. male known diabetic, recent left toe amputation currently on IV antibiotics.  Patient reports that last night he forgot to check his blood sugar and forgot to use his Lantus.  Today when he got up he checked his sugar and it was high, up in the 450 range.  He called his doctor and they advised him to utilize sliding scale, he uses sliding scale short acting insulin 3 times today and his blood sugar has not improved much.  He has not checked it since he gave his third sliding scale dose with dinner.  He was urged by doctors nurse to come for evaluation.  Denies any fevers or chills.  Reports he is followed up with his podiatrist and his wounds been healing well.  He is not noticed any chest pain or trouble breathing.  Feeling in his normal health, except just had trouble with his blood sugar being too high throughout the course the day today     Past Medical History:  Diagnosis Date  . Allergy   . Basal cell carcinoma    forehead  . BPH (benign prostatic hyperplasia)   . Chronic back pain   . Depression   . Diabetes ([place])   . GERD (gastroesophageal reflux disease)   . Heart attack ([place])   . Hypertension   . Morbid obesity ([place])   . Obstructive sleep apnea   . 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 Status post insertion of spinal cord stimulator   . Stroke ([place])   . UTI (lower urinary tract infection)     Patient Active Problem List   Diagnosis Date Noted  . Diabetic ulcer of left foot ([place]) 04/18/2018  . Swelling of limb 04/06/2018  . Atherosclerosis of native arteries of the extremities with ulceration ([place]) 04/06/2018  . Edema of  right lower extremity 04/04/2018  . Diabetic foot infection ([place]) 03/01/2018  . Spinal cord stimulator status 01/12/2018  . Hypertension 09/05/2017  . OSA on CPAP 09/05/2017  . Uncontrolled type 2 diabetes mellitus with hyperglycemia ([place]) 08/02/2017  . Peripheral vascular disease ([place]) 08/02/2017  . Mixed hyperlipidemia 08/02/2017  . Dysuria 08/02/2017  . Obesity, Class III, BMI 40-49.9 (morbid obesity) ([place]) 08/10/2016  . Hemiparesis affecting left side as late effect of stroke ([place]) 05/09/2016  . Symptomatic bradycardia 03/21/2016  . Falls frequently 02/26/2016  . Acute renal failure (ARF) ([place]) 02/18/2016  . Hypotension 02/18/2016  . Sick sinus syndrome ([place]) 10/28/2015  . Syncopal episodes 10/26/2015  . Syncope 10/26/2015  . Adjustment disorder with mixed anxiety and depressed mood 10/20/2015  . Dysthymia 10/20/2015  . CVA (cerebral infarction) 10/19/2015  . Urinary retention 03/25/2015  . 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 CAD (coronary artery disease) 12/29/2014  . Acute MI, anterolateral wall, subsequent episode of care ([place]) 12/19/2014  . SIRS (systemic inflammatory response syndrome) ([place]) 12/18/2014  . Systemic inflammatory response syndrome (sirs) of non-infectious origin without acute organ dysfunction ([place]) 12/18/2014  . Encounter for monitoring opioid maintenance therapy 11/10/2014  . LVH (left ventricular hypertrophy) due to hypertensive disease, with heart failure ([place]) 10/02/2014  . Benign essential hypertension 09/26/2014  . Chronic diastolic CHF (congestive heart  failure), NYHA class 2 ([place]) 06/03/2014  . Pain syndrome, chronic 03/02/2014  . Diabetes ([place]) 11/08/2013  . Chronic radicular low back pain 08/21/2013  . Right shoulder pain 04/03/2013  . Urinary tract infection 03/16/2012  . Balanoposthitis 02/13/2012  . History of urinary stone 02/13/2012  . Hypertrophy of prostate with urinary obstruction and other lower urinary tract symptoms (LUTS) 02/13/2012    . 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 Redundant prepuce and phimosis 02/13/2012  . Urge incontinence 02/13/2012  . Right foot pain 01/18/2012  . Chronic, continuous use of opioids 08/22/2011    Past Surgical History:  Procedure Laterality Date  . AMPUTATION TOE Left 04/19/2018   Procedure: AMPUTATION TOE-LEFT GREAT TOE;  Surgeon: Sharlotte Alamo, DPM;  Location: [facility] ORS;  Service: Podiatry;  Laterality: Left;  . CARDIAC CATHETERIZATION N/A 12/19/2014   Procedure: Coronary Stent Intervention;  Surgeon: Charolette Forward, MD;  Location: [place] [facility] LAB;  Service: Cardiovascular;  Laterality: N/A;  . CARDIAC CATHETERIZATION Left 12/19/2014   Procedure: Left Heart Cath and Coronary Angiography;  Surgeon: Dionisio Japheth, MD;  Location: [place] [facility];  Service: Cardiovascular;  Laterality: Left;  . IRRIGATION AND DEBRIDEMENT FOOT Left 03/02/2018   Procedure: IRRIGATION AND DEBRIDEMENT FOOT;  Surgeon: Samara Deist, DPM;  Location: [facility] ORS;  Service: Podiatry;  Laterality: Left;  . IRRIGATION AND DEBRIDEMENT FOOT Left 03/08/2018   Procedure: IRRIGATION AND DEBRIDEMENT FOOT AND BONE;  Surgeon: Sharlotte Alamo, DPM;  Location: [facility] ORS;  Service: Podiatry;  Laterality: Left;  . IRRIGATION AND DEBRIDEMENT FOOT Left 04/19/2018   Procedure: IRRIGATION AND DEBRIDEMENT FOOT;  Surgeon: Sharlotte Alamo, DPM;  Location: [facility] ORS;  Service: Podiatry;  Laterality: Left;  . LOWER EXTREMITY ANGIOGRAPHY Left 03/05/2018   Procedure: Lower Extremity Angiography;  Surgeon: Algernon Huxley, MD;  Location: [place] [facility];  Service: Cardiovascular;  Laterality: Left;  . LOWER EXTREMITY ANGIOGRAPHY Left 03/08/2018   Procedure: LOWER EXTREMITY ANGIOGRAPHY;  Surgeon: Algernon Huxley, MD;  Location: [place] [facility] LAB;  Service: Cardiovascular;  Laterality: Left;  . PACEMAKER INSERTION Left 11/02/2015   Procedure: INSERTION PACEMAKER;  Surgeon: Isaias Cowman, MD;  Location: [facility] ORS;  Service: Cardiovascular;  Laterality:  Left;  . Pain Stimulator    . Right toe amputation      Prior to Admission medications   Medication Sig Start Date End Date Taking? Authorizing Provider  acetaminophen (TYLENOL) 325 MG tablet Take 2 tablets (650 mg total) by mouth every 6 (six) hours as needed for mild pain (or Fever >/= 101). 04/23/18  Yes Gouru, Illene Silver, MD  amLODipine (NORVASC) 10 MG tablet Take 1 tablet (10 mg total) by mouth daily. 04/24/18  Yes Gouru, Illene Silver, MD  aspirin EC 81 MG tablet Take 81 mg by mouth daily.   Yes [provider]  benazepril (LOTENSIN) 20 MG tablet TAKE 1 TABLET BY MOUTH TWICE DAILY Patient taking differently: Take 20 mg by mouth 2 (two) times daily.  03/01/18  Yes Boscia, Greer Ee, NP  cefTRIAXone (ROCEPHIN) IVPB Inject 2 g into the vein daily for 23 days. Indication: osteomyelitis of foot Last Day of Therapy:  05/17/2018 Labs - Once weekly:  CBC/D and BMP, Labs - Every other week:  ESR and CRP 04/24/18 05/17/18 Yes Gouru, Aruna, MD  clopidogrel (PLAVIX) 75 MG tablet Take 1 tablet (75 mg total) by mouth daily. 07/17/17  Yes Boscia, Greer Ee, NP  DULoxetine (CYMBALTA) 60 MG capsule Take 1 capsule (60 mg total) by mouth daily. 03/13/18  Yes Salary, Montell D, MD  Exenatide ER (BYDUREON) 2 MG PEN Inject 2 mg into the skin once a week. Patient taking differently: Inject 2 mg into the skin every Wednesday.  07/17/17  Yes Boscia, Heather E, NP  fenofibrate 54 MG tablet TAKE 1 TABLET BY MOUTH A DAY AT SUPPERTIME Patient taking differently: Take 54 mg by mouth daily with supper.  12/06/17  Yes Boscia, Heather E, NP  fluticasone (FLONASE) 50 MCG/ACT nasal spray Place 2 sprays into both nostrils daily.   Yes [provider]  hydrochlorothiazide (HYDRODIURIL) 12.5 MG tablet Take 1 tablet (12.5 mg total) by mouth daily. 09/04/17  Yes Boscia, Greer Ee, NP  ibuprofen (ADVIL,MOTRIN) 400 MG tablet Take 400 mg by mouth every 6 (six) hours as needed (1-2 tablets prn).   Yes [provider]  insulin  glargine (LANTUS) 100 UNIT/ML injection Inject 0.7 mLs (70 Units total) into the skin daily. 04/23/18  Yes Gouru, Illene Silver, MD  Insulin Pen Needle (B-D UF III MINI PEN NEEDLES) 31G X 5 MM MISC Use as directed with insulin e11.65 12/26/17  Yes Boscia, Heather E, NP  insulin regular (NOVOLIN R) 100 units/mL injection Patient to use Novolin R [place] TiD prior to meals per sliding scale instructions. Max daily dose is 45 units per day. 07/19/17  Yes Boscia, Heather E, NP  INSULIN SYRINGE 1CC/29G 29G X 1/2" 1 ML MISC Insulin injections QID, use with lantus and regular insulin. DX E11.65 12/28/17  Yes Ronnell Freshwater, NP  Insulin Syringe-Needle U-100 (BD INSULIN SYRINGE U/F 1/2UNIT) 31G X 5/16" 0.3 ML MISC Indulin injection QID. Use with lantus and regular insulin Dx. 11.65 01/08/18  Yes Lavera Guise, MD  linezolid (ZYVOX) 600 MG tablet Take 1 tablet (600 mg total) by mouth 2 (two) times daily for 14 days. 04/23/18 05/07/18 Yes Gouru, Illene Silver, MD  methocarbamol (ROBAXIN) 500 MG tablet Take 1 tablet (500 mg total) by mouth every 8 (eight) hours as needed for muscle spasms. Patient taking differently: Take 500 mg by mouth 2 (two) times daily.  03/12/18  Yes Salary, Avel Peace, MD  metroNIDAZOLE (FLAGYL) 500 MG tablet Take 1 tablet (500 mg total) by mouth 3 (three) times daily for 24 days. 04/23/18 05/17/18 Yes Gouru, Aruna, MD  NARCAN 4 MG/0.1ML LIQD Take 4 mg by mouth as needed (for opioid overdose).    Yes [provider]  nitroGLYCERIN (NITROSTAT) 0.4 MG SL tablet Place 1 tablet (0.4 mg total) under the tongue every 5 (five) minutes x 3 doses as needed for chest pain. 12/21/14  Yes Dixie Dials, MD  omega-3 acid ethyl esters (LOVAZA) 1 g capsule Take 1 capsule (1 g total) by mouth 2 (two) times daily. 12/28/17  Yes Boscia, Greer Ee, NP  Oxcarbazepine (TRILEPTAL) 300 MG tablet Take 300-600 mg by mouth See admin instructions. Take 1 tablet (300MG) by mouth every morning and 2 tablets (600MG) by mouth every night   Yes  [provider]  oxyCODONE (OXY IR/ROXICODONE) 5 MG immediate release tablet Take 1 tablet (5 mg total) by mouth every 4 (four) hours as needed for moderate pain. 04/23/18  Yes Gouru, Illene Silver, MD  oxyCODONE (OXYCONTIN) 10 mg 12 hr tablet Take 1 tablet (10 mg total) by mouth every 12 (twelve) hours. 04/23/18  Yes Gouru, Aruna, MD  pantoprazole (PROTONIX) 40 MG tablet TAKE 1 TABLET BY MOUTH A DAY FOR REFLUX Patient taking differently: Take 40 mg by mouth daily.  12/26/17  Yes Ronnell Freshwater, NP  polyethylene glycol (MIRALAX / GLYCOLAX) packet Take 17 g by mouth daily as needed.   Yes [provider]  pregabalin (LYRICA) 200 MG capsule Take 200 mg by mouth See admin instructions. Take 2 capsules (400MG) by mouth every morning and 1 capsule (200MG) by mouth every night   Yes [provider]  tamsulosin (FLOMAX) 0.4 MG CAPS capsule Take 1 capsule (0.4 mg total) by mouth daily after supper. 12/27/17  Yes Ronnell Freshwater, NP  senna (SENOKOT) 8.6 MG TABS tablet Take 1 tablet (8.6 mg total) by mouth 2 (two) times daily. Patient not taking: Reported on 04/18/2018 02/21/16   Gladstone Lighter, MD    Allergies Amoxicillin; Other; Penicillins; Hydralazine; Cephalexin; Crestor [rosuvastatin calcium]; Metoprolol; Red dye; Statins; Sulfa antibiotics; Yellow dyes (non-tartrazine); Aspirin; and Tape  Family History  Problem Relation Age of Onset  . Breast cancer Mother   . Cancer Mother   . Hypertension Mother   . Lung cancer Father   . Hypertension Father   . Heart disease Father   . Cancer Father   . Kidney disease Sister   . Prostate cancer Neg Hx     Social History Social History   Tobacco Use  . Smoking status: Former Smoker    Types: Cigarettes  . Smokeless tobacco: Never Used  . Tobacco comment: quit 45 years  Substance Use Topics  . Alcohol use: Not Currently    Alcohol/week: 0.0 standard drinks    Comment: have not had alcohol in 57yr  . Drug use: No     Review of Systems Constitutional: No fever/chills Eyes: No visual changes. ENT: No sore throat. Cardiovascular: Denies chest pain. Respiratory: Denies shortness of breath. Gastrointestinal: No abdominal pain.   Genitourinary: Negative for dysuria. Musculoskeletal: Negative for back pain.  Left foot wound is healing well. Skin: Negative for rash. Neurological: Negative for headaches, areas of focal weakness or numbness.    ____________________________________________   PHYSICAL EXAM:  VITAL SIGNS: ED Triage Vitals  Enc Vitals Group     BP 05/02/18 1807 128/70     Pulse Rate 05/02/18 1807 (!) 59     Resp 05/02/18 1807 20     Temp 05/02/18 1807 (!) 97.4 F (36.3 C)     Temp Source 05/02/18 1807 Oral     SpO2 05/02/18 1807 98 %     Weight 05/02/18 1809 293 lb 3.4 oz (133 kg)     Height 05/02/18 1809 5' 7"  (1.702 m)     Head Circumference --      Peak Flow --      Pain Score 05/02/18 1808 8     Pain Loc --      Pain Edu? --      Excl. in [place] --     Constitutional: Alert and oriented. Well appearing and in no acute distress. Eyes: Conjunctivae are normal. Head: Atraumatic. Nose: No congestion/rhinnorhea. Mouth/Throat: Mucous membranes are moist. Neck: No stridor.  Cardiovascular: Normal rate, regular rhythm. Grossly normal heart sounds.  Good peripheral circulation. Respiratory: Normal respiratory effort.  No retractions. Lungs CTAB. Gastrointestinal: Soft and nontender. No distention.  Obese. Musculoskeletal: No lower extremity tenderness nor edema.  Left lower extremity, clean dry and intact surgical wound over the foot.  No purulence or drainage.  Does not appear to be overtly infected.  Clinical media uploaded Neurologic:  Normal speech and language. No gross focal neurologic deficits are appreciated.  Skin:  Skin is warm, dry and intact. No rash noted. Psychiatric: Mood  and affect are normal. Speech and behavior are  normal.  ____________________________________________   LABS (all labs ordered are listed, but only abnormal results are displayed)  Labs Reviewed  GLUCOSE, CAPILLARY - Abnormal; Notable for the following components:      Result Value   Glucose-Capillary 234 (*)    All other components within normal limits  CBC WITH DIFFERENTIAL/PLATELET - Abnormal; Notable for the following components:   Hemoglobin 11.4 (*)    HCT 36.7 (*)    MCV 77.4 (*)    MCH 24.1 (*)    RDW 15.7 (*)    All other components within normal limits  COMPREHENSIVE METABOLIC PANEL - Abnormal; Notable for the following components:   Glucose, Bld 207 (*)    Total Bilirubin 0.2 (*)    All other components within normal limits  GLUCOSE, CAPILLARY - Abnormal; Notable for the following components:   Glucose-Capillary 103 (*)    All other components within normal limits   ____________________________________________  EKG   ____________________________________________  RADIOLOGY   ____________________________________________   PROCEDURES  Procedure(s) performed: None  Procedures  Critical Care performed: No  ____________________________________________   INITIAL IMPRESSION / ASSESSMENT AND PLAN / ED COURSE  Pertinent labs & imaging results that were available during my care of the patient were reviewed by me and considered in my medical decision making (see chart for details).   Patient was for evaluation due to hyperglycemia today after missing dose of Lantus last night.  After the patient is utilized his sliding scale at home, his blood sugar improved to the 200s on our evaluation in the ER, and 103 after additional couple hours time.  He is now normoglycemic.  He is alert, eating a meal and we discussed not utilizing his Lantus tonight and also checking a blood sugar at about 2 AM.  Patient will go home, check his blood sugar and implants to eat a small meal and then have a  Appears that he is  corrected his hyperglycemia without intervention here.  He is resting comfortably, no systemic symptoms or signs of infection.  Patient has access to his insulin and seems to have a good understanding of management of his diabetes.  Return precautions and treatment recommendations and follow-up discussed with the patient who is agreeable with the plan.       ____________________________________________   FINAL CLINICAL IMPRESSION(S) / ED DIAGNOSES  Final diagnoses:  Hyperglycemia        Note:  This document was prepared using Dragon voice recognition software and may include unintentional dictation errors       Delman Kitten, MD 05/02/18 2114

## 2018-05-02 NOTE — Discharge Instructions (Addendum)
Please set an alarm to check your blood sugar at 2AM, if it is less than 100 please make sure to have something with sugar in it to eat and assure your blood sugar doesn't drop lower.  Call your doctor's office tomorrow for follow-up.  Do NOT take your lantus this evening. I recommend you use your sliding scale tomorrow and restart lantus tomorrow evening.

## 2018-05-02 NOTE — ED Notes (Signed)
Foot dressing placed with wet to dry gauze dressing, gauze wrap, and ace bandage wrap.

## 2018-05-02 NOTE — Telephone Encounter (Signed)
[facility] [phone] called that pt sugar was 529 as per heather advised he need go to er and also

## 2018-05-02 NOTE — ED Triage Notes (Signed)
Pt reports that his blood sugar was 525, he was able to get it down to 430 and then it began to go up again. Pt has a picc line in his left arm and is getting antibiotics that is also causing him to have diarrhea.

## 2018-05-02 NOTE — Telephone Encounter (Signed)
Spoke pt as per heather for his sugar if is not going down from 529 he need to go to ER he forget to take insulin on lunch he just took his insulin I advised that if sugar is not going down he can go to ER

## 2018-05-03 ENCOUNTER — Telehealth: Payer: Self-pay | Admitting: Licensed Clinical Social Worker

## 2018-05-03 DIAGNOSIS — L03116 Cellulitis of left lower limb: Secondary | ICD-10-CM | POA: Diagnosis not present

## 2018-05-03 DIAGNOSIS — I959 Hypotension, unspecified: Secondary | ICD-10-CM | POA: Diagnosis not present

## 2018-05-03 DIAGNOSIS — G464 Cerebellar stroke syndrome: Secondary | ICD-10-CM | POA: Diagnosis not present

## 2018-05-03 DIAGNOSIS — R262 Difficulty in walking, not elsewhere classified: Secondary | ICD-10-CM | POA: Diagnosis not present

## 2018-05-03 DIAGNOSIS — I639 Cerebral infarction, unspecified: Secondary | ICD-10-CM | POA: Diagnosis not present

## 2018-05-03 DIAGNOSIS — L089 Local infection of the skin and subcutaneous tissue, unspecified: Secondary | ICD-10-CM | POA: Diagnosis not present

## 2018-05-03 DIAGNOSIS — M6281 Muscle weakness (generalized): Secondary | ICD-10-CM | POA: Diagnosis not present

## 2018-05-03 DIAGNOSIS — M549 Dorsalgia, unspecified: Secondary | ICD-10-CM | POA: Diagnosis not present

## 2018-05-03 DIAGNOSIS — C44319 Basal cell carcinoma of skin of other parts of face: Secondary | ICD-10-CM | POA: Diagnosis not present

## 2018-05-03 DIAGNOSIS — Z95820 Peripheral vascular angioplasty status with implants and grafts: Secondary | ICD-10-CM | POA: Diagnosis not present

## 2018-05-03 DIAGNOSIS — E11628 Type 2 diabetes mellitus with other skin complications: Secondary | ICD-10-CM | POA: Diagnosis not present

## 2018-05-03 DIAGNOSIS — M62838 Other muscle spasm: Secondary | ICD-10-CM | POA: Diagnosis not present

## 2018-05-03 DIAGNOSIS — E1169 Type 2 diabetes mellitus with other specified complication: Secondary | ICD-10-CM | POA: Diagnosis not present

## 2018-05-03 DIAGNOSIS — F341 Dysthymic disorder: Secondary | ICD-10-CM | POA: Diagnosis not present

## 2018-05-03 DIAGNOSIS — I10 Essential (primary) hypertension: Secondary | ICD-10-CM | POA: Diagnosis not present

## 2018-05-03 DIAGNOSIS — L97521 Non-pressure chronic ulcer of other part of left foot limited to breakdown of skin: Secondary | ICD-10-CM | POA: Diagnosis not present

## 2018-05-03 DIAGNOSIS — N179 Acute kidney failure, unspecified: Secondary | ICD-10-CM | POA: Diagnosis not present

## 2018-05-03 DIAGNOSIS — E11621 Type 2 diabetes mellitus with foot ulcer: Secondary | ICD-10-CM | POA: Diagnosis not present

## 2018-05-03 NOTE — Telephone Encounter (Signed)
I called patient to schedule a hospital follow up appointment, I was unable to reach him by phone. I left a message on his voicemail asking him to call the office for an appt on 12/31

## 2018-05-03 NOTE — Telephone Encounter (Signed)
I called [facility] and his labs from 12/16 are:  Victor Wise also faxed a hard copy to the office fax on 12/16 ESR-70 WBC-5.3 BUN-15 CR-.8 Ab neutrophils-2.9 CRP-13 Potassium-3.2

## 2018-05-04 ENCOUNTER — Other Ambulatory Visit (INDEPENDENT_AMBULATORY_CARE_PROVIDER_SITE_OTHER): Payer: Self-pay | Admitting: Nurse Practitioner

## 2018-05-04 ENCOUNTER — Ambulatory Visit (INDEPENDENT_AMBULATORY_CARE_PROVIDER_SITE_OTHER): Payer: Medicare HMO | Admitting: Nurse Practitioner

## 2018-05-04 ENCOUNTER — Encounter (INDEPENDENT_AMBULATORY_CARE_PROVIDER_SITE_OTHER): Payer: Self-pay

## 2018-05-04 ENCOUNTER — Ambulatory Visit (INDEPENDENT_AMBULATORY_CARE_PROVIDER_SITE_OTHER): Payer: Self-pay | Admitting: Nurse Practitioner

## 2018-05-04 VITALS — BP 116/72 | HR 65 | Resp 14

## 2018-05-04 DIAGNOSIS — R600 Localized edema: Secondary | ICD-10-CM | POA: Diagnosis not present

## 2018-05-04 NOTE — Progress Notes (Signed)
History of Present Illness  There is no documented history at this time  Assessments & Plan   There are no diagnoses linked to this encounter.    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    Procedure:  3 layer unna wrap was placed Left lower extremity.   Plan:   Follow up in one week.  

## 2018-05-07 ENCOUNTER — Ambulatory Visit: Payer: Self-pay | Admitting: Adult Health

## 2018-05-07 DIAGNOSIS — Z95820 Peripheral vascular angioplasty status with implants and grafts: Secondary | ICD-10-CM | POA: Diagnosis not present

## 2018-05-07 DIAGNOSIS — M549 Dorsalgia, unspecified: Secondary | ICD-10-CM | POA: Diagnosis not present

## 2018-05-07 DIAGNOSIS — I10 Essential (primary) hypertension: Secondary | ICD-10-CM | POA: Diagnosis not present

## 2018-05-07 DIAGNOSIS — E1169 Type 2 diabetes mellitus with other specified complication: Secondary | ICD-10-CM | POA: Diagnosis not present

## 2018-05-07 DIAGNOSIS — M62838 Other muscle spasm: Secondary | ICD-10-CM | POA: Diagnosis not present

## 2018-05-07 DIAGNOSIS — L97521 Non-pressure chronic ulcer of other part of left foot limited to breakdown of skin: Secondary | ICD-10-CM | POA: Diagnosis not present

## 2018-05-07 DIAGNOSIS — E11621 Type 2 diabetes mellitus with foot ulcer: Secondary | ICD-10-CM | POA: Diagnosis not present

## 2018-05-07 DIAGNOSIS — F341 Dysthymic disorder: Secondary | ICD-10-CM | POA: Diagnosis not present

## 2018-05-07 DIAGNOSIS — L03116 Cellulitis of left lower limb: Secondary | ICD-10-CM | POA: Diagnosis not present

## 2018-05-10 DIAGNOSIS — L97521 Non-pressure chronic ulcer of other part of left foot limited to breakdown of skin: Secondary | ICD-10-CM | POA: Diagnosis not present

## 2018-05-10 DIAGNOSIS — E1169 Type 2 diabetes mellitus with other specified complication: Secondary | ICD-10-CM | POA: Diagnosis not present

## 2018-05-10 DIAGNOSIS — M549 Dorsalgia, unspecified: Secondary | ICD-10-CM | POA: Diagnosis not present

## 2018-05-10 DIAGNOSIS — E11621 Type 2 diabetes mellitus with foot ulcer: Secondary | ICD-10-CM | POA: Diagnosis not present

## 2018-05-10 DIAGNOSIS — Z95820 Peripheral vascular angioplasty status with implants and grafts: Secondary | ICD-10-CM | POA: Diagnosis not present

## 2018-05-10 DIAGNOSIS — I10 Essential (primary) hypertension: Secondary | ICD-10-CM | POA: Diagnosis not present

## 2018-05-10 DIAGNOSIS — M62838 Other muscle spasm: Secondary | ICD-10-CM | POA: Diagnosis not present

## 2018-05-10 DIAGNOSIS — L03116 Cellulitis of left lower limb: Secondary | ICD-10-CM | POA: Diagnosis not present

## 2018-05-10 DIAGNOSIS — F341 Dysthymic disorder: Secondary | ICD-10-CM | POA: Diagnosis not present

## 2018-05-11 ENCOUNTER — Other Ambulatory Visit: Payer: Self-pay

## 2018-05-11 DIAGNOSIS — F341 Dysthymic disorder: Secondary | ICD-10-CM | POA: Diagnosis not present

## 2018-05-11 DIAGNOSIS — M549 Dorsalgia, unspecified: Secondary | ICD-10-CM | POA: Diagnosis not present

## 2018-05-11 DIAGNOSIS — M62838 Other muscle spasm: Secondary | ICD-10-CM | POA: Diagnosis not present

## 2018-05-11 DIAGNOSIS — L97521 Non-pressure chronic ulcer of other part of left foot limited to breakdown of skin: Secondary | ICD-10-CM | POA: Diagnosis not present

## 2018-05-11 DIAGNOSIS — I10 Essential (primary) hypertension: Secondary | ICD-10-CM | POA: Diagnosis not present

## 2018-05-11 DIAGNOSIS — Z95820 Peripheral vascular angioplasty status with implants and grafts: Secondary | ICD-10-CM | POA: Diagnosis not present

## 2018-05-11 DIAGNOSIS — E1169 Type 2 diabetes mellitus with other specified complication: Secondary | ICD-10-CM | POA: Diagnosis not present

## 2018-05-11 DIAGNOSIS — L03116 Cellulitis of left lower limb: Secondary | ICD-10-CM | POA: Diagnosis not present

## 2018-05-11 DIAGNOSIS — E11621 Type 2 diabetes mellitus with foot ulcer: Secondary | ICD-10-CM | POA: Diagnosis not present

## 2018-05-11 NOTE — Patient Outreach (Signed)
[place] [facility]) Care Management  05/11/2018  Victor Wise December 02, 1950 [id number]   Telephone Screen  Referral Date: 05/11/18 Referral Source: [facility] Referral Reason: " DM, HTN, patient needs continuous nursing services for monitoring med and diet compliance" Insurance: [facility]   Outreach attempt # 1 to patient. No answer. RN CM left HIPAA compliant voicemail message along with contact info.   Plan: RN CM will make outreach attempt to patient within 3-4 business days. RN CM will send unsuccessful outreach letter to patient.   Enzo Montgomery, RN,BSN,CCM [place] Management Telephonic Care Management Coordinator Direct Phone: [phone] Toll Free: [phone] Fax: [phone]

## 2018-05-12 DIAGNOSIS — I639 Cerebral infarction, unspecified: Secondary | ICD-10-CM | POA: Diagnosis not present

## 2018-05-12 DIAGNOSIS — C44319 Basal cell carcinoma of skin of other parts of face: Secondary | ICD-10-CM | POA: Diagnosis not present

## 2018-05-12 DIAGNOSIS — N179 Acute kidney failure, unspecified: Secondary | ICD-10-CM | POA: Diagnosis not present

## 2018-05-12 DIAGNOSIS — M6281 Muscle weakness (generalized): Secondary | ICD-10-CM | POA: Diagnosis not present

## 2018-05-12 DIAGNOSIS — G464 Cerebellar stroke syndrome: Secondary | ICD-10-CM | POA: Diagnosis not present

## 2018-05-12 DIAGNOSIS — I959 Hypotension, unspecified: Secondary | ICD-10-CM | POA: Diagnosis not present

## 2018-05-12 DIAGNOSIS — L089 Local infection of the skin and subcutaneous tissue, unspecified: Secondary | ICD-10-CM | POA: Diagnosis not present

## 2018-05-12 DIAGNOSIS — R262 Difficulty in walking, not elsewhere classified: Secondary | ICD-10-CM | POA: Diagnosis not present

## 2018-05-12 DIAGNOSIS — E11628 Type 2 diabetes mellitus with other skin complications: Secondary | ICD-10-CM | POA: Diagnosis not present

## 2018-05-14 ENCOUNTER — Encounter (INDEPENDENT_AMBULATORY_CARE_PROVIDER_SITE_OTHER): Payer: Medicare HMO

## 2018-05-14 ENCOUNTER — Ambulatory Visit (INDEPENDENT_AMBULATORY_CARE_PROVIDER_SITE_OTHER): Payer: Medicare HMO | Admitting: Nurse Practitioner

## 2018-05-14 ENCOUNTER — Other Ambulatory Visit: Payer: Self-pay

## 2018-05-14 ENCOUNTER — Encounter (INDEPENDENT_AMBULATORY_CARE_PROVIDER_SITE_OTHER): Payer: Self-pay

## 2018-05-14 DIAGNOSIS — E11621 Type 2 diabetes mellitus with foot ulcer: Secondary | ICD-10-CM | POA: Diagnosis not present

## 2018-05-14 DIAGNOSIS — M62838 Other muscle spasm: Secondary | ICD-10-CM | POA: Diagnosis not present

## 2018-05-14 DIAGNOSIS — I10 Essential (primary) hypertension: Secondary | ICD-10-CM | POA: Diagnosis not present

## 2018-05-14 DIAGNOSIS — Z95820 Peripheral vascular angioplasty status with implants and grafts: Secondary | ICD-10-CM | POA: Diagnosis not present

## 2018-05-14 DIAGNOSIS — L03116 Cellulitis of left lower limb: Secondary | ICD-10-CM | POA: Diagnosis not present

## 2018-05-14 DIAGNOSIS — F341 Dysthymic disorder: Secondary | ICD-10-CM | POA: Diagnosis not present

## 2018-05-14 DIAGNOSIS — E1169 Type 2 diabetes mellitus with other specified complication: Secondary | ICD-10-CM | POA: Diagnosis not present

## 2018-05-14 DIAGNOSIS — M549 Dorsalgia, unspecified: Secondary | ICD-10-CM | POA: Diagnosis not present

## 2018-05-14 DIAGNOSIS — M86172 Other acute osteomyelitis, left ankle and foot: Secondary | ICD-10-CM | POA: Diagnosis not present

## 2018-05-14 DIAGNOSIS — L97521 Non-pressure chronic ulcer of other part of left foot limited to breakdown of skin: Secondary | ICD-10-CM | POA: Diagnosis not present

## 2018-05-14 NOTE — Patient Outreach (Signed)
[place] [facility]) Care Management  05/14/2018  Victor Wise 1951/04/11 [id number]   Telephone Screen  Referral Date: 05/11/18 Referral Source: [facility] Referral Reason: " DM, HTN, patient needs continuous nursing services for monitoring med and diet compliance" Insurance: [employer]   Outreach attempt #2 to patient.No answer at present.     Plan: RN CM will make outreach attempt to patient within 3-4 business days.  Enzo Montgomery, RN,BSN,CCM [place] Management Telephonic Care Management Coordinator Direct Phone: [phone] Toll Free: [phone] Fax: [phone]

## 2018-05-15 ENCOUNTER — Encounter: Payer: Self-pay | Admitting: Infectious Diseases

## 2018-05-15 ENCOUNTER — Ambulatory Visit: Payer: Medicare HMO | Attending: Infectious Diseases | Admitting: Infectious Diseases

## 2018-05-15 ENCOUNTER — Other Ambulatory Visit
Admission: RE | Admit: 2018-05-15 | Discharge: 2018-05-15 | Disposition: A | Discharge status: Home / Self Care | Payer: Medicare HMO | Source: Ambulatory Visit | Attending: Infectious Diseases | Admitting: Infectious Diseases

## 2018-05-15 ENCOUNTER — Other Ambulatory Visit: Payer: Self-pay

## 2018-05-15 VITALS — BP 146/88 | HR 86 | Temp 98.1°F

## 2018-05-15 DIAGNOSIS — R296 Repeated falls: Secondary | ICD-10-CM

## 2018-05-15 DIAGNOSIS — A09 Infectious gastroenteritis and colitis, unspecified: Secondary | ICD-10-CM

## 2018-05-15 DIAGNOSIS — M869 Osteomyelitis, unspecified: Secondary | ICD-10-CM

## 2018-05-15 DIAGNOSIS — I2510 Atherosclerotic heart disease of native coronary artery without angina pectoris: Secondary | ICD-10-CM | POA: Diagnosis not present

## 2018-05-15 DIAGNOSIS — E1169 Type 2 diabetes mellitus with other specified complication: Secondary | ICD-10-CM | POA: Diagnosis not present

## 2018-05-15 DIAGNOSIS — R197 Diarrhea, unspecified: Secondary | ICD-10-CM | POA: Diagnosis not present

## 2018-05-15 DIAGNOSIS — Z91048 Other nonmedicinal substance allergy status: Secondary | ICD-10-CM

## 2018-05-15 DIAGNOSIS — Z95820 Peripheral vascular angioplasty status with implants and grafts: Secondary | ICD-10-CM

## 2018-05-15 DIAGNOSIS — Z9682 Presence of neurostimulator: Secondary | ICD-10-CM

## 2018-05-15 DIAGNOSIS — E1151 Type 2 diabetes mellitus with diabetic peripheral angiopathy without gangrene: Secondary | ICD-10-CM

## 2018-05-15 DIAGNOSIS — Z888 Allergy status to other drugs, medicaments and biological substances status: Secondary | ICD-10-CM

## 2018-05-15 DIAGNOSIS — Z9181 History of falling: Secondary | ICD-10-CM

## 2018-05-15 DIAGNOSIS — L089 Local infection of the skin and subcutaneous tissue, unspecified: Secondary | ICD-10-CM

## 2018-05-15 DIAGNOSIS — E11628 Type 2 diabetes mellitus with other skin complications: Secondary | ICD-10-CM | POA: Diagnosis not present

## 2018-05-15 DIAGNOSIS — Z89412 Acquired absence of left great toe: Secondary | ICD-10-CM | POA: Diagnosis not present

## 2018-05-15 DIAGNOSIS — Z881 Allergy status to other antibiotic agents status: Secondary | ICD-10-CM

## 2018-05-15 DIAGNOSIS — Z1621 Resistance to vancomycin: Secondary | ICD-10-CM

## 2018-05-15 DIAGNOSIS — Z87891 Personal history of nicotine dependence: Secondary | ICD-10-CM

## 2018-05-15 DIAGNOSIS — Z886 Allergy status to analgesic agent status: Secondary | ICD-10-CM

## 2018-05-15 DIAGNOSIS — Z950 Presence of cardiac pacemaker: Secondary | ICD-10-CM

## 2018-05-15 DIAGNOSIS — Z955 Presence of coronary angioplasty implant and graft: Secondary | ICD-10-CM

## 2018-05-15 DIAGNOSIS — Z880 Allergy status to penicillin: Secondary | ICD-10-CM

## 2018-05-15 DIAGNOSIS — B961 Klebsiella pneumoniae [K. pneumoniae] as the cause of diseases classified elsewhere: Secondary | ICD-10-CM | POA: Diagnosis not present

## 2018-05-15 NOTE — Patient Outreach (Signed)
[place] [facility]) Care Management  05/15/2018  Victor Wise [birth year] [id number]   Telephone Screen  Referral Date:05/11/18 Referral Source:[facility] Referral Reason:" DM, HTN, patient needs continuous nursing services for monitoring med and diet compliance" Insurance:[employer]    Outreach attempt #3 to patient. No answer at present. RN CM left HIPAA compliant voicemail message along with contact info.   Plan: RN CM will close case if no response from letter mailed to patient.   Enzo Montgomery, RN,BSN,CCM [place] Management Telephonic Care Management Coordinator Direct Phone: [phone] Toll Free: [phone] Fax: [phone]

## 2018-05-15 NOTE — Patient Instructions (Addendum)
You are here for follow up for the left foot infection- you currently are taking 2 antibioitcs-one IV and one PO- today I am doing some culture and then will decide on whether you will have to continue the antibiotics. You complained of diarrhea and we looked at one of your stool when you were in the bathroom- it is soft and semi solid and does not need to be  Tested for cdiff- recommend increasing fiber by the way of vegetables and oat. Follow up with your PCP. As you fell in the restroom we had to get our medic alert team and your BP and Pulse was fine- You did not want to go to the ED as you felt okay and also you said that you had fallen at home because of lack of grip from your shoes- I will let Dr.Cline your podiatrist know about that.if you have any pain or swelling or you dont feel well please go the ED

## 2018-05-15 NOTE — Progress Notes (Signed)
NAME: Victor Wise  DOB: 10-02-1950  MRN: [id number]  Date/Time: 05/15/2018 12:04 PM Subjective:  Follow-up after hospital discharge  ? Victor Wise is a 67 y.o. male with a history of diabetes mellitus, coronary artery disease, has a pacemaker, has a spinal stimulator was in the hospital between 04/18/2018 and 04/23/2018 for osteomyelitis and abscess of his left foot. He underwent amputation of the left great toe and debridement of the bone of the left first metatarsal.  Culture of the bone grew VRE and Klebsiella.  Pathology showed osteomyelitis with extension up to the margin of the bone.  He was discharged home on IV ceftriaxone ( 4 weeks), p.o. Flagyl ( 4 weeks) and p.o. linezolid ( 2 weeks).  He was seen by Dr. Ola Spurr in the hospital. The plan was to do IV antibiotic until 05/17/18   Pt says the foot is swollen and red. Last saw Dr.Cline on 12/23 and lot of drainage was noted.  Pt has diarrhea- says it is black- many times a day >7 No fever or pain abdomen No nausea or vomiting  Pt has a complicated history   was in the hospital in October between 03/01/2018 until 03/13/2018 when he had excision of the infected sesamoid bone on the left first metatarsal joint and drainage of the abscess.  During that admission the infection was polymicrobial with E. coli, enterococcus and Bacteroides in the culture.  He was initially treated with IV vancomycin and meropenem and and then IV Vanco and ceftriaxone and Flagyl p.o. as it was thought to be therapeutic surgery by the podiatrist he was sent home on Omnicef 300 mg twice daily and Flagyl 5 mg 3 times daily.  He also had peripheral arterial disease left side with occlusion of the superficial femoral artery and all 3 tibial vessels and had revascularization ( difficult )with  stent placement in October 2019.  He was followed by Dr. Caryl Comes as outpatient and on his visit to his office on 04/09/2018 moderate to heavy drainage was still noted from the wound on  the left foot.  And there was still a full-thickness ulcerative area medially at the first metatarsal area noted.  It was treated with sharp excision and continuing with sterile saline wet-to-dry dressing. As not getting better he was readmitted Past Medical History:  Diagnosis Date  . Allergy   . Basal cell carcinoma    forehead  . BPH (benign prostatic hyperplasia)   . Chronic back pain   . Depression   . Diabetes ([place])   . GERD (gastroesophageal reflux disease)   . Heart attack ([place])   . Hypertension   . Morbid obesity ([place])   . Obstructive sleep apnea   . Osteomyelitis of foot ([place])   . Status post insertion of spinal cord stimulator   . Stroke ([place])   . UTI (lower urinary tract infection)     Past Surgical History:  Procedure Laterality Date  . AMPUTATION TOE Left 04/19/2018   Procedure: AMPUTATION TOE-LEFT GREAT TOE;  Surgeon: Sharlotte Alamo, DPM;  Location: [facility];  Service: Podiatry;  Laterality: Left;  . CARDIAC CATHETERIZATION N/A 12/19/2014   Procedure: Coronary Stent Intervention;  Surgeon: Charolette Forward, MD;  Location: [place] [facility];  Service: Cardiovascular;  Laterality: N/A;  . CARDIAC CATHETERIZATION Left 12/19/2014   Procedure: Left Heart Cath and Coronary Angiography;  Surgeon: Dionisio Bard, MD;  Location: [place] [facility];  Service: Cardiovascular;  Laterality: Left;  . IRRIGATION AND DEBRIDEMENT FOOT Left  03/02/2018   Procedure: IRRIGATION AND DEBRIDEMENT FOOT;  Surgeon: Samara Deist, DPM;  Location: [facility];  Service: Podiatry;  Laterality: Left;  . IRRIGATION AND DEBRIDEMENT FOOT Left 03/08/2018   Procedure: IRRIGATION AND DEBRIDEMENT FOOT AND BONE;  Surgeon: Sharlotte Alamo, DPM;  Location: [facility];  Service: Podiatry;  Laterality: Left;  . IRRIGATION AND DEBRIDEMENT FOOT Left 04/19/2018   Procedure: IRRIGATION AND DEBRIDEMENT FOOT;  Surgeon: Sharlotte Alamo, DPM;  Location: [facility];  Service: Podiatry;  Laterality: Left;  . LOWER EXTREMITY ANGIOGRAPHY Left  03/05/2018   Procedure: Lower Extremity Angiography;  Surgeon: Algernon Huxley, MD;  Location: [place] [facility];  Service: Cardiovascular;  Laterality: Left;  . LOWER EXTREMITY ANGIOGRAPHY Left 03/08/2018   Procedure: LOWER EXTREMITY ANGIOGRAPHY;  Surgeon: Algernon Huxley, MD;  Location: [place] [facility];  Service: Cardiovascular;  Laterality: Left;  . PACEMAKER INSERTION Left 11/02/2015   Procedure: INSERTION PACEMAKER;  Surgeon: Isaias Cowman, MD;  Location: [facility];  Service: Cardiovascular;  Laterality: Left;  . Pain Stimulator    . Right toe amputation      Social History   Socioeconomic History  . Marital status: Divorced    Spouse name: Not on file  . Number of children: Not on file  . Years of education: Not on file  . Highest education level: Not on file  Occupational History  . Not on file  Social Needs  . Financial resource strain: Not on file  . Food insecurity:    Worry: Not on file    Inability: Not on file  . Transportation needs:    Medical: Not on file    Non-medical: Not on file  Tobacco Use  . Smoking status: Former Smoker    Types: Cigarettes  . Smokeless tobacco: Never Used  . Tobacco comment: quit 45 years  Substance and Sexual Activity  . Alcohol use: Not Currently    Alcohol/week: 0.0 standard drinks    Comment: have not had alcohol in 51yr  . Drug use: No  . Sexual activity: Not on file  Lifestyle  . Physical activity:    Days per week: Not on file    Minutes per session: Not on file  . Stress: Not on file  Relationships  . Social connections:    Talks on phone: Not on file    Gets together: Not on file    Attends religious service: Not on file    Active member of club or organization: Not on file    Attends meetings of clubs or organizations: Not on file    Relationship status: Not on file  . Intimate partner violence:    Fear of current or ex partner: Not on file    Emotionally abused: Not on file    Physically abused: Not on  file    Forced sexual activity: Not on file  Other Topics Concern  . Not on file  Social History Narrative  . Not on file    Family History  Problem Relation Age of Onset  . Breast cancer Mother   . Cancer Mother   . Hypertension Mother   . Lung cancer Father   . Hypertension Father   . Heart disease Father   . Cancer Father   . Kidney disease Sister   . Prostate cancer Neg Hx    Allergies  Allergen Reactions  . Amoxicillin Other (See Comments)    Has never taken amoxicillin -ENT told him not to take (? Had skin  test) Has patient had a PCN reaction causing immediate rash, facial/tongue/throat swelling, SOB or lightheadedness with hypotension: No Has patient had a PCN reaction causing severe rash involving mucus membranes or skin necrosis: No Has patient had a PCN reaction that required hospitalization No Has patient had a PCN reaction occurring within the last 10 years: No If above answers are "NO", then may proceed w/ Cephalo  . Other Anaphylaxis, Itching and Other (See Comments)    Pt states that he is allergic to Endopa.  Reaction:  Anaphylaxis  Pt states that he is allergic to Metabisulfites Reaction:  Itching   . Penicillins Other (See Comments)    Arm turned "blue" at injection site (no treatment needed) Has patient had a PCN reaction causing immediate rash, facial/tongue/throat swelling, SOB or lightheadedness with hypotension: No Has patient had a PCN reaction causing severe rash involving mucus membranes or skin necrosis: No Has patient had a PCN reaction that required hospitalization No Has patient had a PCN reaction occurring within the last 10 years: No If all of the above answers are "NO", then may proceed with Cephalosporin use.  [name] Hydralazine Other (See Comments)    Reaction:  Cramping of extremities Pt reports tolerating low dose 12.44m but no higher.  . Cephalexin Itching  . Clindamycin Itching  . Crestor [Rosuvastatin Calcium] Other (See Comments)     Reaction:  Pt is unable to move arms/legs   . Metoprolol Nausea And Vomiting  . Red Dye Itching  . Statins Other (See Comments)    Reaction:  Pt is unable to move arms/legs  . Sulfa Antibiotics Itching    Other reaction(s): Unknown  . Yellow Dyes (Non-Tartrazine) Itching  . Aspirin Itching and Other (See Comments)    Pt states that he is able to use in lower doses.    . Tape Rash    Please use "paper" tape only. Please use "paper" tape only.  ? Current Outpatient Medications  Medication Sig Dispense Refill  . acetaminophen (TYLENOL) 325 MG tablet Take 2 tablets (650 mg total) by mouth every 6 (six) hours as needed for mild pain (or Fever >/= 101).    .Marland KitchenamLODipine (NORVASC) 10 MG tablet Take 1 tablet (10 mg total) by mouth daily. 30 tablet 0  . aspirin EC 81 MG tablet Take 81 mg by mouth daily.    . benazepril (LOTENSIN) 20 MG tablet TAKE 1 TABLET BY MOUTH TWICE DAILY (Patient taking differently: Take 20 mg by mouth 2 (two) times daily. ) 60 tablet 2  . cefTRIAXone (ROCEPHIN) IVPB Inject 2 g into the vein daily for 23 days. Indication: osteomyelitis of foot Last Day of Therapy:  05/17/2018 Labs - Once weekly:  CBC/D and BMP, Labs - Every other week:  ESR and CRP 23 Units 0  . clopidogrel (PLAVIX) 75 MG tablet Take 1 tablet (75 mg total) by mouth daily. 30 tablet 11  . DULoxetine (CYMBALTA) 60 MG capsule Take 1 capsule (60 mg total) by mouth daily. 90 capsule 0  . Exenatide ER (BYDUREON) 2 MG PEN Inject 2 mg into the skin once a week. (Patient taking differently: Inject 2 mg into the skin every Wednesday. ) 4 each 5  . fenofibrate 54 MG tablet TAKE 1 TABLET BY MOUTH A DAY AT SUPPERTIME (Patient taking differently: Take 54 mg by mouth daily with supper. ) 30 tablet 5  . fluticasone (FLONASE) 50 MCG/ACT nasal spray Place 2 sprays into both nostrils daily.    . hydrochlorothiazide (HYDRODIURIL)  12.5 MG tablet Take 1 tablet (12.5 mg total) by mouth daily. 30 tablet 5  . ibuprofen (ADVIL,MOTRIN)  400 MG tablet Take 400 mg by mouth every 6 (six) hours as needed (1-2 tablets prn).    . insulin glargine (LANTUS) 100 UNIT/ML injection Inject 0.7 mLs (70 Units total) into the skin daily. 10 mL 11  . Insulin Pen Needle (B-D UF III MINI PEN NEEDLES) 31G X 5 MM MISC Use as directed with insulin e11.65 100 each 3  . insulin regular (NOVOLIN R) 100 units/mL injection Patient to use Novolin R Mayer TiD prior to meals per sliding scale instructions. Max daily dose is 45 units per day. 10 mL 11  . INSULIN SYRINGE 1CC/29G 29G X 1/2" 1 ML MISC Insulin injections QID, use with lantus and regular insulin. DX E11.65 120 each 5  . Insulin Syringe-Needle U-100 (BD INSULIN SYRINGE U/F 1/2UNIT) 31G X 5/16" 0.3 ML MISC Indulin injection QID. Use with lantus and regular insulin Dx. 11.65 400 each 3  . methocarbamol (ROBAXIN) 500 MG tablet Take 1 tablet (500 mg total) by mouth every 8 (eight) hours as needed for muscle spasms. (Patient taking differently: Take 500 mg by mouth 2 (two) times daily. ) 90 tablet 0  . metroNIDAZOLE (FLAGYL) 500 MG tablet Take 1 tablet (500 mg total) by mouth 3 (three) times daily for 24 days. 72 tablet 0  . NARCAN 4 MG/0.1ML LIQD Take 4 mg by mouth as needed (for opioid overdose).     . nitroGLYCERIN (NITROSTAT) 0.4 MG SL tablet Place 1 tablet (0.4 mg total) under the tongue every 5 (five) minutes x 3 doses as needed for chest pain. 25 tablet 1  . omega-3 acid ethyl esters (LOVAZA) 1 g capsule Take 1 capsule (1 g total) by mouth 2 (two) times daily. 60 capsule 5  . Oxcarbazepine (TRILEPTAL) 300 MG tablet Take 300-600 mg by mouth See admin instructions. Take 1 tablet (300MG) by mouth every morning and 2 tablets (600MG) by mouth every night    . oxyCODONE (OXY IR/ROXICODONE) 5 MG immediate release tablet Take 1 tablet (5 mg total) by mouth every 4 (four) hours as needed for moderate pain. 15 tablet 0  . oxyCODONE (OXYCONTIN) 10 mg 12 hr tablet Take 1 tablet (10 mg total) by mouth every 12  (twelve) hours. 20 tablet 0  . pantoprazole (PROTONIX) 40 MG tablet TAKE 1 TABLET BY MOUTH A DAY FOR REFLUX (Patient taking differently: Take 40 mg by mouth daily. ) 30 tablet 3  . polyethylene glycol (MIRALAX / GLYCOLAX) packet Take 17 g by mouth daily as needed.    . pregabalin (LYRICA) 200 MG capsule Take 200 mg by mouth See admin instructions. Take 2 capsules (400MG) by mouth every morning and 1 capsule (200MG) by mouth every night    . senna (SENOKOT) 8.6 MG TABS tablet Take 1 tablet (8.6 mg total) by mouth 2 (two) times daily. (Patient not taking: Reported on 04/18/2018) 120 each 0  . tamsulosin (FLOMAX) 0.4 MG CAPS capsule Take 1 capsule (0.4 mg total) by mouth daily after supper. 30 capsule 3   No current facility-administered medications for this visit.      Abtx:  Anti-infectives (From admission, onward)   None      REVIEW OF SYSTEMS:  Const: negative fever, negative chills, negative weight loss Eyes: negative diplopia or visual changes, negative eye pain ENT: negative coryza, negative sore throat Resp: negative cough, hemoptysis, dyspnea Cards: negative for chest pain, palpitations,  lower extremity edema GU: negative for frequency, dysuria and hematuria GI: Negative for abdominal pain, has diarrhea, no bleeding, constipation Skin: negative for rash and pruritus Heme: negative for easy bruising and gum/nose bleeding MS: has weakness Neurolo:negative for headaches, dizziness, vertigo, memory problems  Psych:  anxiety, depression  Endocrine: polyuria Allergy/Immunology- multiple antibiotic allergy? Pertinent Positives include : Objective:  VITALS:  BP (!) 146/88 (BP Location: Left Arm, Patient Position: Sitting, Cuff Size: Large)   Pulse 86   Temp 98.1 F (36.7 C) (Oral)  PHYSICAL EXAM:  General: Alert, cooperative, no distress, appears stated age. Pale, in wheel chair Head: Normocephalic, without obvious abnormality, atraumatic. Eyes: Conjunctivae clear, anicteric  sclerae. Pupils are equal ENT Nares normal. No drainage or sinus tenderness. Lips, mucosa, and tongue normal. No Thrush Neck: Supple, symmetrical, no adenopathy, thyroid: non tender no carotid bruit and no JVD. Back: not examined Lungs: b/l air entry Heart: s1s- pacemaker  Abdomen: Soft, obese on-tender,not distended. Bowel sounds normal. No masses Extremities: left foot- great toe resection-surgical site- has a wound with discharge-        Rt foot- small superficial wound ( callus site)  at the site of 1st MTP- superficial ulcer    Skin: No rashes or lesions. Or bruising Lymph: Cervical, supraclavicular normal. Neurologic: Grossly non-focal Pertinent Labs Lab Results CBC    Component Value Date/Time   WBC 5.7 05/02/2018 1811   RBC 4.74 05/02/2018 1811   HGB 11.4 (L) 05/02/2018 1811   HGB 11.6 (L) 02/06/2014 1628   HCT 36.7 (L) 05/02/2018 1811   HCT 35.9 (L) 02/06/2014 1628   PLT 333 05/02/2018 1811   PLT 221 02/06/2014 1628   MCV 77.4 (L) 05/02/2018 1811   MCV 78 (L) 02/06/2014 1628   MCH 24.1 (L) 05/02/2018 1811   MCHC 31.1 05/02/2018 1811   RDW 15.7 (H) 05/02/2018 1811   RDW 17.9 (H) 02/06/2014 1628   LYMPHSABS 2.3 05/02/2018 1811   LYMPHSABS 1.7 12/16/2013 0324   MONOABS 0.5 05/02/2018 1811   MONOABS 0.3 12/16/2013 0324   EOSABS 0.3 05/02/2018 1811   EOSABS 0.2 12/16/2013 0324   BASOSABS 0.1 05/02/2018 1811   BASOSABS 0.1 12/16/2013 0324    CMP Latest Ref Rng & Units 05/02/2018 04/23/2018 04/21/2018  Glucose 70 - 99 mg/dL 207(H) - 277(H)  BUN 8 - 23 mg/dL 21 - 15  Creatinine 0.61 - 1.24 mg/dL 1.01 0.75 0.79  Sodium 135 - 145 mmol/L 138 - 139  Potassium 3.5 - 5.1 mmol/L 4.2 - 3.8  Chloride 98 - 111 mmol/L 100 - 106  CO2 22 - 32 mmol/L 31 - 29  Calcium 8.9 - 10.3 mg/dL 9.4 - 8.1(L)  Total Protein 6.5 - 8.1 g/dL 7.6 - -  Total Bilirubin 0.3 - 1.2 mg/dL 0.2(L) - -  Alkaline Phos 38 - 126 U/L 61 - -  AST 15 - 41 U/L 17 - -  ALT 0 - 44 U/L 15 - -       Microbiology: No results found for this or any previous visit (from the past 240 hour(s)). IMAGING RESULTS: ? Impression/Recommendation 67 y.o. male with a history of diabetes mellitus, coronary artery disease, has a pacemaker, has a spinal stimulator was in the hospital between 04/18/2018 and 04/23/2018 for osteomyelitis and abscess of his left foot. He underwent amputation of the left great toe and debridement of the bone of the left first metatarsal.  Culture of the bone grew VRE and Klebsiella.  Pathology showed osteomyelitis with extension  up to the margin of the bone.  He was discharged home on IV ceftriaxone ( 4 weeks), p.o. Flagyl ( 4 weeks) and p.o. linezolid ( 2 weeks).  He was seen by Dr. Ola Spurr in the hospital. The plan was to do IV antibiotic until 05/17/18   Pt says the foot is swollen and red. Last saw Dr.Cline on 12/23 and lot of drainage was noted.  Pt has diarrhea- says it is black- many times a day >7 No fever or pain abdomen No nausea or vomiting ? ?Left foot infection-/osteomyelitis  s/p amputation of the great toe- has an open wound of 4 cm depth at the site- discharge and some slough.polymicrobial with  Recent labs look good with ESR 27  and Normal WBC and cr. Sent culture from the wound- will discuss with Dr.Cline regarding further imaging or intervention   Diarrhea- pt was asked to give a sample to test for cdiff and he went to the restroom. He slipped and fell in the restroom and could not get up. BP was 169/79 and HR 74. No obvious injury Media alert called and with help of the Ed physician and other personnel ,patient was lifted and as he did not want to go to the Ed was placed back in his wheel chair. Pt then said that he has fallen many times at home because of the special podiatry shoes he is wearing which has no grip. He recently had fallen and EMS was called by his PT and they got him up 2 weeks ago  Pt was told to go to the ED if he has any pain,  swelling or not feeling well  The stool was soft and not liquid and hence cdiff test was not needed. Asked him to add more fiber to his diet   Will discuss with his Podiatrist

## 2018-05-17 DIAGNOSIS — E1169 Type 2 diabetes mellitus with other specified complication: Secondary | ICD-10-CM | POA: Diagnosis not present

## 2018-05-17 DIAGNOSIS — M62838 Other muscle spasm: Secondary | ICD-10-CM | POA: Diagnosis not present

## 2018-05-17 DIAGNOSIS — L03116 Cellulitis of left lower limb: Secondary | ICD-10-CM | POA: Diagnosis not present

## 2018-05-17 DIAGNOSIS — M549 Dorsalgia, unspecified: Secondary | ICD-10-CM | POA: Diagnosis not present

## 2018-05-17 DIAGNOSIS — L97521 Non-pressure chronic ulcer of other part of left foot limited to breakdown of skin: Secondary | ICD-10-CM | POA: Diagnosis not present

## 2018-05-17 DIAGNOSIS — I10 Essential (primary) hypertension: Secondary | ICD-10-CM | POA: Diagnosis not present

## 2018-05-17 DIAGNOSIS — Z95820 Peripheral vascular angioplasty status with implants and grafts: Secondary | ICD-10-CM | POA: Diagnosis not present

## 2018-05-17 DIAGNOSIS — E11621 Type 2 diabetes mellitus with foot ulcer: Secondary | ICD-10-CM | POA: Diagnosis not present

## 2018-05-17 DIAGNOSIS — F341 Dysthymic disorder: Secondary | ICD-10-CM | POA: Diagnosis not present

## 2018-05-18 DIAGNOSIS — E11621 Type 2 diabetes mellitus with foot ulcer: Secondary | ICD-10-CM | POA: Diagnosis not present

## 2018-05-18 DIAGNOSIS — M62838 Other muscle spasm: Secondary | ICD-10-CM | POA: Diagnosis not present

## 2018-05-18 DIAGNOSIS — I10 Essential (primary) hypertension: Secondary | ICD-10-CM | POA: Diagnosis not present

## 2018-05-18 DIAGNOSIS — M549 Dorsalgia, unspecified: Secondary | ICD-10-CM | POA: Diagnosis not present

## 2018-05-18 DIAGNOSIS — L97521 Non-pressure chronic ulcer of other part of left foot limited to breakdown of skin: Secondary | ICD-10-CM | POA: Diagnosis not present

## 2018-05-18 DIAGNOSIS — F341 Dysthymic disorder: Secondary | ICD-10-CM | POA: Diagnosis not present

## 2018-05-18 DIAGNOSIS — Z95820 Peripheral vascular angioplasty status with implants and grafts: Secondary | ICD-10-CM | POA: Diagnosis not present

## 2018-05-18 DIAGNOSIS — L03116 Cellulitis of left lower limb: Secondary | ICD-10-CM | POA: Diagnosis not present

## 2018-05-18 DIAGNOSIS — E1169 Type 2 diabetes mellitus with other specified complication: Secondary | ICD-10-CM | POA: Diagnosis not present

## 2018-05-21 ENCOUNTER — Other Ambulatory Visit: Payer: Self-pay

## 2018-05-21 ENCOUNTER — Encounter: Payer: Self-pay | Admitting: Internal Medicine

## 2018-05-21 ENCOUNTER — Ambulatory Visit: Payer: Medicare HMO | Admitting: Internal Medicine

## 2018-05-21 VITALS — BP 160/83 | HR 76 | Resp 16 | Ht 68.3 in | Wt 296.0 lb

## 2018-05-21 DIAGNOSIS — J449 Chronic obstructive pulmonary disease, unspecified: Secondary | ICD-10-CM | POA: Diagnosis not present

## 2018-05-21 DIAGNOSIS — G2581 Restless legs syndrome: Secondary | ICD-10-CM | POA: Diagnosis not present

## 2018-05-21 DIAGNOSIS — L03116 Cellulitis of left lower limb: Secondary | ICD-10-CM | POA: Diagnosis not present

## 2018-05-21 DIAGNOSIS — Z9989 Dependence on other enabling machines and devices: Secondary | ICD-10-CM

## 2018-05-21 DIAGNOSIS — Z95820 Peripheral vascular angioplasty status with implants and grafts: Secondary | ICD-10-CM | POA: Diagnosis not present

## 2018-05-21 DIAGNOSIS — G4733 Obstructive sleep apnea (adult) (pediatric): Secondary | ICD-10-CM | POA: Diagnosis not present

## 2018-05-21 DIAGNOSIS — M549 Dorsalgia, unspecified: Secondary | ICD-10-CM | POA: Diagnosis not present

## 2018-05-21 DIAGNOSIS — E11621 Type 2 diabetes mellitus with foot ulcer: Secondary | ICD-10-CM | POA: Diagnosis not present

## 2018-05-21 DIAGNOSIS — F341 Dysthymic disorder: Secondary | ICD-10-CM | POA: Diagnosis not present

## 2018-05-21 DIAGNOSIS — E1169 Type 2 diabetes mellitus with other specified complication: Secondary | ICD-10-CM | POA: Diagnosis not present

## 2018-05-21 DIAGNOSIS — I10 Essential (primary) hypertension: Secondary | ICD-10-CM | POA: Diagnosis not present

## 2018-05-21 DIAGNOSIS — M62838 Other muscle spasm: Secondary | ICD-10-CM | POA: Diagnosis not present

## 2018-05-21 DIAGNOSIS — K219 Gastro-esophageal reflux disease without esophagitis: Secondary | ICD-10-CM | POA: Diagnosis not present

## 2018-05-21 DIAGNOSIS — L97521 Non-pressure chronic ulcer of other part of left foot limited to breakdown of skin: Secondary | ICD-10-CM | POA: Diagnosis not present

## 2018-05-21 LAB — AEROBIC CULTURE W GRAM STAIN (SUPERFICIAL SPECIMEN)

## 2018-05-21 MED ORDER — AMLODIPINE BESYLATE 10 MG PO TABS: 10.0000 mg | ORAL_TABLET | Freq: Every day | ORAL | 3 refills | Status: DC

## 2018-05-21 NOTE — Patient Instructions (Signed)
Chronic Obstructive Pulmonary Disease Chronic obstructive pulmonary disease (COPD) is a long-term (chronic) lung problem. When you have COPD, it is hard for air to get in and out of your lungs. Usually the condition gets worse over time, and your lungs will never return to normal. There are things you can do to keep yourself as healthy as possible.  Your doctor may treat your condition with: ? Medicines. ? Oxygen. ? Lung surgery.  Your doctor may also recommend: ? Rehabilitation. This includes steps to make your body work better. It may involve a team of specialists. ? Quitting smoking, if you smoke. ? Exercise and changes to your diet. ? Comfort measures (palliative care). Follow these instructions at home: Medicines  Take over-the-counter and prescription medicines only as told by your doctor.  Talk to your doctor before taking any cough or allergy medicines. You may need to avoid medicines that cause your lungs to be dry. Lifestyle  If you smoke, stop. Smoking makes the problem worse. If you need help quitting, ask your doctor.  Avoid being around things that make your breathing worse. This may include smoke, chemicals, and fumes.  Stay active, but remember to rest as well.  Learn and use tips on how to relax.  Make sure you get enough sleep. Most adults need at least 7 hours of sleep every night.  Eat healthy foods. Eat smaller meals more often. Rest before meals. Controlled breathing Learn and use tips on how to control your breathing as told by your doctor. Try:  Breathing in (inhaling) through your nose for 1 second. Then, pucker your lips and breath out (exhale) through your lips for 2 seconds.  Putting one hand on your belly (abdomen). Breathe in slowly through your nose for 1 second. Your hand on your belly should move out. Pucker your lips and breathe out slowly through your lips. Your hand on your belly should move in as you breathe out.  Controlled coughing Learn  and use controlled coughing to clear mucus from your lungs. Follow these steps: 1. Lean your head a little forward. 2. Breathe in deeply. 3. Try to hold your breath for 3 seconds. 4. Keep your mouth slightly open while coughing 2 times. 5. Spit any mucus out into a tissue. 6. Rest and do the steps again 1 or 2 times as needed. General instructions  Make sure you get all the shots (vaccines) that your doctor recommends. Ask your doctor about a flu shot and a pneumonia shot.  Use oxygen therapy and pulmonary rehabilitation if told by your doctor. If you need home oxygen therapy, ask your doctor if you should buy a tool to measure your oxygen level (oximeter).  Make a COPD action plan with your doctor. This helps you to know what to do if you feel worse than usual.  Manage any other conditions you have as told by your doctor.  Avoid going outside when it is very hot, cold, or humid.  Avoid people who have a sickness you can catch (contagious).  Keep all follow-up visits as told by your doctor. This is important. Contact a doctor if:  You cough up more mucus than usual.  There is a change in the color or thickness of the mucus.  It is harder to breathe than usual.  Your breathing is faster than usual.  You have trouble sleeping.  You need to use your medicines more often than usual.  You have trouble doing your normal activities such as getting dressed   or walking around the house. Get help right away if:  You have shortness of breath while resting.  You have shortness of breath that stops you from: ? Being able to talk. ? Doing normal activities.  Your chest hurts for longer than 5 minutes.  Your skin color is more blue than usual.  Your pulse oximeter shows that you have low oxygen for longer than 5 minutes.  You have a fever.  You feel too tired to breathe normally. Summary  Chronic obstructive pulmonary disease (COPD) is a long-term lung problem.  The way your  lungs work will never return to normal. Usually the condition gets worse over time. There are things you can do to keep yourself as healthy as possible.  Take over-the-counter and prescription medicines only as told by your doctor.  If you smoke, stop. Smoking makes the problem worse. This information is not intended to replace advice given to you by your health care provider. Make sure you discuss any questions you have with your health care provider. Document Released: 10/19/2007 Document Revised: 06/06/2016 Document Reviewed: 06/06/2016 Elsevier Interactive Patient Education  2019 [employer]  

## 2018-05-21 NOTE — Progress Notes (Signed)
[facility] [place], [place] [address]  Pulmonary Sleep Medicine   Office Visit Note  Patient Name: Victor Wise DOB: 04/27/1951 MRN [id number]  Date of Service: 05/21/2018  Complaints/HPI: Pt is here for follow up on COPD, OSA, and GERD.  Overall he is doing well from a pulmonary standpoint.  He has been in and out of the hospital for Diabetic foot ulcers and complications.    ROS  General: (-) fever, (-) chills, (-) night sweats, (-) weakness Skin: (-) rashes, (-) itching,. Eyes: (-) visual changes, (-) redness, (-) itching. Nose and Sinuses: (-) nasal stuffiness or itchiness, (-) postnasal drip, (-) nosebleeds, (-) sinus trouble. Mouth and Throat: (-) sore throat, (-) hoarseness. Neck: (-) swollen glands, (-) enlarged thyroid, (-) neck pain. Respiratory: - cough, (-) bloody sputum, - shortness of breath, - wheezing. Cardiovascular: - ankle swelling, (-) chest pain. Lymphatic: (-) lymph node enlargement. Neurologic: (-) numbness, (-) tingling. Psychiatric: (-) anxiety, (-) depression   Current Medication: Outpatient Encounter Medications as of 05/21/2018  Medication Sig  . acetaminophen (TYLENOL) 325 MG tablet Take 2 tablets (650 mg total) by mouth every 6 (six) hours as needed for mild pain (or Fever >/= 101).  [name] amLODipine (NORVASC) 10 MG tablet Take 1 tablet (10 mg total) by mouth daily.  [name] aspirin EC 81 MG tablet Take 81 mg by mouth daily.  . benazepril (LOTENSIN) 20 MG tablet TAKE 1 TABLET BY MOUTH TWICE DAILY (Patient taking differently: Take 20 mg by mouth 2 (two) times daily. )  . clopidogrel (PLAVIX) 75 MG tablet Take 1 tablet (75 mg total) by mouth daily.  . DULoxetine (CYMBALTA) 60 MG capsule Take 1 capsule (60 mg total) by mouth daily.  . Exenatide ER (BYDUREON) 2 MG PEN Inject 2 mg into the skin once a week. (Patient taking differently: Inject 2 mg into the skin every Wednesday. )  . fenofibrate 54 MG tablet TAKE 1 TABLET BY MOUTH A DAY  AT SUPPERTIME (Patient taking differently: Take 54 mg by mouth daily with supper. )  . fluticasone (FLONASE) 50 MCG/ACT nasal spray Place 2 sprays into both nostrils daily.  . hydrochlorothiazide (HYDRODIURIL) 12.5 MG tablet Take 1 tablet (12.5 mg total) by mouth daily.  [name] ibuprofen (ADVIL,MOTRIN) 400 MG tablet Take 400 mg by mouth every 6 (six) hours as needed (1-2 tablets prn).  . insulin glargine (LANTUS) 100 UNIT/ML injection Inject 0.7 mLs (70 Units total) into the skin daily.  . Insulin Pen Needle (B-D UF III MINI PEN NEEDLES) 31G X 5 MM MISC Use as directed with insulin e11.65  . insulin regular (NOVOLIN R) 100 units/mL injection Patient to use Novolin R Robards TiD prior to meals per sliding scale instructions. Max daily dose is 45 units per day.  . INSULIN SYRINGE 1CC/29G 29G X 1/2" 1 ML MISC Insulin injections QID, use with lantus and regular insulin. DX E11.65  . Insulin Syringe-Needle U-100 (BD INSULIN SYRINGE U/F 1/2UNIT) 31G X 5/16" 0.3 ML MISC Indulin injection QID. Use with lantus and regular insulin Dx. 11.65  . methocarbamol (ROBAXIN) 500 MG tablet Take 1 tablet (500 mg total) by mouth every 8 (eight) hours as needed for muscle spasms. (Patient taking differently: Take 500 mg by mouth 2 (two) times daily. )  . NARCAN 4 MG/0.1ML LIQD Take 4 mg by mouth as needed (for opioid overdose).   . nitroGLYCERIN (NITROSTAT) 0.4 MG SL tablet Place 1 tablet (0.4 mg total) under the tongue every 5 (  five) minutes x 3 doses as needed for chest pain.  [name] omega-3 acid ethyl esters (LOVAZA) 1 g capsule Take 1 capsule (1 g total) by mouth 2 (two) times daily.  . Oxcarbazepine (TRILEPTAL) 300 MG tablet Take 300-600 mg by mouth See admin instructions. Take 1 tablet (300MG) by mouth every morning and 2 tablets (600MG) by mouth every night  . oxyCODONE (OXY IR/ROXICODONE) 5 MG immediate release tablet Take 1 tablet (5 mg total) by mouth every 4 (four) hours as needed for moderate pain.  [name] oxyCODONE (OXYCONTIN) 10  mg 12 hr tablet Take 1 tablet (10 mg total) by mouth every 12 (twelve) hours.  . pantoprazole (PROTONIX) 40 MG tablet TAKE 1 TABLET BY MOUTH A DAY FOR REFLUX (Patient taking differently: Take 40 mg by mouth daily. )  . polyethylene glycol (MIRALAX / GLYCOLAX) packet Take 17 g by mouth daily as needed.  . pregabalin (LYRICA) 200 MG capsule Take 200 mg by mouth See admin instructions. Take 2 capsules (400MG) by mouth every morning and 1 capsule (200MG) by mouth every night  . senna (SENOKOT) 8.6 MG TABS tablet Take 1 tablet (8.6 mg total) by mouth 2 (two) times daily.  . tamsulosin (FLOMAX) 0.4 MG CAPS capsule Take 1 capsule (0.4 mg total) by mouth daily after supper.   No facility-administered encounter medications on file as of 05/21/2018.     Surgical History: Past Surgical History:  Procedure Laterality Date  . AMPUTATION TOE Left 04/19/2018   Procedure: AMPUTATION TOE-LEFT GREAT TOE;  Surgeon: Sharlotte Alamo, DPM;  Location: [facility];  Service: Podiatry;  Laterality: Left;  . CARDIAC CATHETERIZATION N/A 12/19/2014   Procedure: Coronary Stent Intervention;  Surgeon: Charolette Forward, MD;  Location: [facility];  Service: Cardiovascular;  Laterality: N/A;  . CARDIAC CATHETERIZATION Left 12/19/2014   Procedure: Left Heart Cath and Coronary Angiography;  Surgeon: Dionisio Saul, MD;  Location: [facility];  Service: Cardiovascular;  Laterality: Left;  . IRRIGATION AND DEBRIDEMENT FOOT Left 03/02/2018   Procedure: IRRIGATION AND DEBRIDEMENT FOOT;  Surgeon: Samara Deist, DPM;  Location: [facility];  Service: Podiatry;  Laterality: Left;  . IRRIGATION AND DEBRIDEMENT FOOT Left 03/08/2018   Procedure: IRRIGATION AND DEBRIDEMENT FOOT AND BONE;  Surgeon: Sharlotte Alamo, DPM;  Location: [facility];  Service: Podiatry;  Laterality: Left;  . IRRIGATION AND DEBRIDEMENT FOOT Left 04/19/2018   Procedure: IRRIGATION AND DEBRIDEMENT FOOT;  Surgeon: Sharlotte Alamo, DPM;  Location: [facility];  Service: Podiatry;   Laterality: Left;  . LOWER EXTREMITY ANGIOGRAPHY Left 03/05/2018   Procedure: Lower Extremity Angiography;  Surgeon: Algernon Huxley, MD;  Location: [facility];  Service: Cardiovascular;  Laterality: Left;  . LOWER EXTREMITY ANGIOGRAPHY Left 03/08/2018   Procedure: LOWER EXTREMITY ANGIOGRAPHY;  Surgeon: Algernon Huxley, MD;  Location: [facility];  Service: Cardiovascular;  Laterality: Left;  . PACEMAKER INSERTION Left 11/02/2015   Procedure: INSERTION PACEMAKER;  Surgeon: Isaias Cowman, MD;  Location: [facility];  Service: Cardiovascular;  Laterality: Left;  . Pain Stimulator    . Right toe amputation      Medical History: Past Medical History:  Diagnosis Date  . Allergy   . Basal cell carcinoma    forehead  . BPH (benign prostatic hyperplasia)   . Chronic back pain   . Depression   . Diabetes ([place])   . GERD (gastroesophageal reflux disease)   . Heart attack ([place])   . Hypertension   . Morbid obesity ([place])   .  Obstructive sleep apnea   . 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 Status post insertion of spinal cord stimulator   . Stroke ([place])   . UTI (lower urinary tract infection)     Family History: Family History  Problem Relation Age of Onset  . Breast cancer Mother   . Cancer Mother   . Hypertension Mother   . Lung cancer Father   . Hypertension Father   . Heart disease Father   . Cancer Father   . Kidney disease Sister   . Prostate cancer Neg Hx     Social History: Social History   Socioeconomic History  . Marital status: Divorced    Spouse name: Not on file  . Number of children: Not on file  . Years of education: Not on file  . Highest education level: Not on file  Occupational History  . Not on file  Social Needs  . Financial resource strain: Not on file  . Food insecurity:    Worry: Not on file    Inability: Not on file  . Transportation needs:    Medical: Not on file    Non-medical: Not on file  Tobacco Use  . Smoking status: Former  Smoker    Types: Cigarettes  . Smokeless tobacco: Never Used  . Tobacco comment: quit 45 years  Substance and Sexual Activity  . Alcohol use: Not Currently    Alcohol/week: 0.0 standard drinks    Comment: have not had alcohol in 61yr  . Drug use: No  . Sexual activity: Not on file  Lifestyle  . Physical activity:    Days per week: Not on file    Minutes per session: Not on file  . Stress: Not on file  Relationships  . Social connections:    Talks on phone: Not on file    Gets together: Not on file    Attends religious service: Not on file    Active member of club or organization: Not on file    Attends meetings of clubs or organizations: Not on file    Relationship status: Not on file  . Intimate partner violence:    Fear of current or ex partner: Not on file    Emotionally abused: Not on file    Physically abused: Not on file    Forced sexual activity: Not on file  Other Topics Concern  . Not on file  Social History Narrative  . Not on file    Vital Signs: Blood pressure (!) 160/83, pulse 76, resp. rate 16, height 5' 8.3" (1.735 m), weight 296 lb (134.3 kg), SpO2 100 %.  Examination: General Appearance: The patient is well-developed, well-nourished, and in no distress. Skin: Gross inspection of skin unremarkable. Head: normocephalic, no gross deformities. Eyes: no gross deformities noted. ENT: ears appear grossly normal no exudates. Neck: Supple. No thyromegaly. No LAD. Respiratory: clear bilateraly. Cardiovascular: Normal S1 and S2 without murmur or rub. Extremities: No cyanosis. pulses are equal. Neurologic: Alert and oriented. No involuntary movements.  LABS: Recent Results (from the past 2160 hour(s))  Basic metabolic panel     Status: Abnormal   Collection Time: 03/01/18  3:14 PM  Result Value Ref Range   Sodium 140 135 - 145 mmol/L   Potassium 4.3 3.5 - 5.1 mmol/L   Chloride 102 98 - 111 mmol/L   CO2 28 22 - 32 mmol/L   Glucose, Bld 127 (H) 70 - 99  mg/dL   BUN 16 8 - 23 mg/dL  Creatinine, Ser 0.91 0.61 - 1.24 mg/dL   Calcium 9.0 8.9 - 10.3 mg/dL   GFR calc non Af Amer >60 >60 mL/min   GFR calc Af Amer >60 >60 mL/min    Comment: (NOTE) The eGFR has been calculated using the CKD EPI equation. This calculation has not been validated in all clinical situations. eGFR's persistently <60 mL/min signify possible Chronic Kidney Disease.    Anion gap 10 5 - 15    Comment: Performed at [facility], [address] [place], [place] [address]  CBC     Status: Abnormal   Collection Time: 03/01/18  3:14 PM  Result Value Ref Range   WBC 9.1 4.0 - 10.5 K/uL   RBC 4.83 4.22 - 5.81 MIL/uL   Hemoglobin 12.3 (L) 13.0 - 17.0 g/dL   HCT 39.1 39.0 - 52.0 %   MCV 81.0 80.0 - 100.0 fL   MCH 25.5 (L) 26.0 - 34.0 pg   MCHC 31.5 30.0 - 36.0 g/dL   RDW 16.2 (H) 11.5 - 15.5 %   Platelets 276 150 - 400 K/uL   nRBC 0.0 0.0 - 0.2 %    Comment: Performed at [facility], [address] [place], [place] [address]  Blood culture (routine x 2)     Status: None   Collection Time: 03/01/18  5:27 PM  Result Value Ref Range   Specimen Description BLOOD LEFT ANTECUBITAL    Special Requests      BOTTLES DRAWN AEROBIC AND ANAEROBIC Blood Culture adequate volume   Culture      NO GROWTH 5 DAYS Performed at [facility], [address] [place], [place] [address]    Report Status 03/06/2018 FINAL   Blood culture (routine x 2)     Status: None   Collection Time: 03/01/18  5:27 PM  Result Value Ref Range   Specimen Description BLOOD RIGHT ANTECUBITAL    Special Requests      BOTTLES DRAWN AEROBIC AND ANAEROBIC Blood Culture results may not be optimal due to an excessive volume of blood received in culture bottles   Culture      NO GROWTH 5 DAYS Performed at [facility], [address], [place], [place] [address]    Report Status 03/06/2018 FINAL   Glucose, capillary     Status: Abnormal   Collection Time: 03/02/18  12:21 AM  Result Value Ref Range   Glucose-Capillary 163 (H) 70 - 99 mg/dL  HIV antibody (Routine Testing)     Status: None   Collection Time: 03/02/18  4:35 AM  Result Value Ref Range   HIV Screen 4th Generation wRfx Non Reactive Non Reactive    Comment: (NOTE) Performed At: [facility] [place], [place] [id number] Rush Farmer MD MP:[id number]   Basic metabolic panel     Status: Abnormal   Collection Time: 03/02/18  4:35 AM  Result Value Ref Range   Sodium 139 135 - 145 mmol/L   Potassium 3.7 3.5 - 5.1 mmol/L   Chloride 102 98 - 111 mmol/L   CO2 28 22 - 32 mmol/L   Glucose, Bld 158 (H) 70 - 99 mg/dL   BUN 14 8 - 23 mg/dL   Creatinine, Ser 0.86 0.61 - 1.24 mg/dL   Calcium 8.5 (L) 8.9 - 10.3 mg/dL   GFR calc non Af Amer >60 >60 mL/min   GFR calc Af Amer >60 >60 mL/min    Comment: (NOTE) The eGFR has been calculated  using the CKD EPI equation. This calculation has not been validated in all clinical situations. eGFR's persistently <60 mL/min signify possible Chronic Kidney Disease.    Anion gap 9 5 - 15    Comment: Performed at [facility], [address] [place], [place] [address]  CBC     Status: Abnormal   Collection Time: 03/02/18  4:35 AM  Result Value Ref Range   WBC 7.2 4.0 - 10.5 K/uL   RBC 4.23 4.22 - 5.81 MIL/uL   Hemoglobin 10.7 (L) 13.0 - 17.0 g/dL   HCT 33.8 (L) 39.0 - 52.0 %   MCV 79.9 (L) 80.0 - 100.0 fL   MCH 25.3 (L) 26.0 - 34.0 pg   MCHC 31.7 30.0 - 36.0 g/dL   RDW 16.2 (H) 11.5 - 15.5 %   Platelets 262 150 - 400 K/uL   nRBC 0.0 0.0 - 0.2 %    Comment: Performed at [facility], [address] [place], [place] [address]  Glucose, capillary     Status: Abnormal   Collection Time: 03/02/18  5:42 AM  Result Value Ref Range   Glucose-Capillary 137 (H) 70 - 99 mg/dL  Glucose, capillary     Status: Abnormal   Collection Time: 03/02/18 10:34 AM  Result Value Ref Range   Glucose-Capillary 165 (H) 70 - 99  mg/dL  Aerobic/Anaerobic Culture (surgical/deep wound)     Status: None   Collection Time: 03/02/18 11:26 AM  Result Value Ref Range   Specimen Description      FOOT Performed at [facility], [address], [place], [place] [address]    Special Requests      LEFT Performed at [facility], [address] [place], [place] [address]    Gram Stain      RARE WBC PRESENT, PREDOMINANTLY PMN ABUNDANT GRAM POSITIVE COCCI FEW GRAM NEGATIVE RODS RARE GRAM POSITIVE RODS    Culture      FEW ESCHERICHIA COLI FEW ENTEROCOCCUS FAECALIS MODERATE BACTEROIDES VULGATUS BETA LACTAMASE POSITIVE Performed at [facility], [place] [address] [place], [place] [address]    Report Status 03/07/2018 FINAL    Organism ID, Bacteria ESCHERICHIA COLI    Organism ID, Bacteria ENTEROCOCCUS FAECALIS       Susceptibility   Escherichia coli - MIC*    AMPICILLIN <=2 SENSITIVE Sensitive     CEFAZOLIN <=4 SENSITIVE Sensitive     CEFEPIME <=1 SENSITIVE Sensitive     CEFTAZIDIME <=1 SENSITIVE Sensitive     CEFTRIAXONE <=1 SENSITIVE Sensitive     CIPROFLOXACIN >=4 RESISTANT Resistant     GENTAMICIN <=1 SENSITIVE Sensitive     IMIPENEM <=0.25 SENSITIVE Sensitive     TRIMETH/SULFA <=20 SENSITIVE Sensitive     AMPICILLIN/SULBACTAM <=2 SENSITIVE Sensitive     PIP/TAZO <=4 SENSITIVE Sensitive     Extended ESBL NEGATIVE Sensitive     * FEW ESCHERICHIA COLI   Enterococcus faecalis - MIC*    AMPICILLIN <=2 SENSITIVE Sensitive     VANCOMYCIN 2 SENSITIVE Sensitive     GENTAMICIN SYNERGY RESISTANT Resistant     * FEW ENTEROCOCCUS FAECALIS  Surgical pathology     Status: None   Collection Time: 03/02/18 11:32 AM  Result Value Ref Range   SURGICAL PATHOLOGY      Surgical Pathology CASE: [id number] PATIENT: Victor Wise Surgical Pathology Report     SPECIMEN SUBMITTED: A. Sesamoid, left foot, infected  CLINICAL HISTORY: None provided  PRE-OPERATIVE DIAGNOSIS: Left foot  infection  POST-OPERATIVE DIAGNOSIS: Same as pre-op     DIAGNOSIS: A. SESAMOID, LEFT FOOT; INCISION AND DRAINAGE: - OSTEOMYELITIS. - OSTEOMYELITIS INVOLVES UNORIENTED INKED CUT SURFACE OF BONE.   GROSS DESCRIPTION: A. Labeled: Left foot infected sesamoid Received: In formalin Size: 1.6 x 1.1 x 0.6 cm Description: Pink-tan fragment of bone, surface opposite smooth articular surface marked green, specimen sectioned  Block summary: 1 - entirely submitted  Tissue decalcification: Yes   Final Diagnosis performed by Quay Burow, MD.   Electronically signed 03/06/2018 11:52:07AM The electronic signature indicates that the named Attending Pathologist has evaluated the specimen  Technical component performed at [facility], [address] [place], [place] [address] Lab: [id number] Dir: Rush Farmer, MD, MMM  Professional component performed at [facility], [address], [place], [place] [address] Lab: [phone] Dir: Dellia Nims. Rubinas, MD   Glucose, capillary     Status: Abnormal   Collection Time: 03/02/18 12:10 PM  Result Value Ref Range   Glucose-Capillary 195 (H) 70 - 99 mg/dL  Hemoglobin A1c     Status: Abnormal   Collection Time: 03/02/18  3:38 PM  Result Value Ref Range   Hgb A1c MFr Bld 8.0 (H) 4.8 - 5.6 %    Comment: (NOTE) Pre diabetes:          5.7%-6.4% Diabetes:              >6.4% Glycemic control for   <7.0% adults with diabetes    Mean Plasma Glucose 182.9 mg/dL    Comment: Performed at [facility] [address] [place], [place] [address]  Glucose, capillary     Status: Abnormal   Collection Time: 03/02/18  6:12 PM  Result Value Ref Range   Glucose-Capillary 211 (H) 70 - 99 mg/dL   Comment 1 Notify RN   Glucose, capillary     Status: Abnormal   Collection Time: 03/02/18  7:58 PM  Result Value Ref Range   Glucose-Capillary 275 (H) 70 - 99 mg/dL   Comment 1 Notify RN   Glucose, capillary     Status: Abnormal    Collection Time: 03/02/18 11:49 PM  Result Value Ref Range   Glucose-Capillary 204 (H) 70 - 99 mg/dL   Comment 1 Notify RN   Glucose, capillary     Status: Abnormal   Collection Time: 03/03/18  6:10 AM  Result Value Ref Range   Glucose-Capillary 188 (H) 70 - 99 mg/dL   Comment 1 Notify RN   Basic metabolic panel     Status: Abnormal   Collection Time: 03/03/18  7:26 AM  Result Value Ref Range   Sodium 139 135 - 145 mmol/L   Potassium 4.1 3.5 - 5.1 mmol/L   Chloride 102 98 - 111 mmol/L   CO2 29 22 - 32 mmol/L   Glucose, Bld 200 (H) 70 - 99 mg/dL   BUN 14 8 - 23 mg/dL   Creatinine, Ser 1.05 0.61 - 1.24 mg/dL   Calcium 8.3 (L) 8.9 - 10.3 mg/dL   GFR calc non Af Amer >60 >60 mL/min   GFR calc Af Amer >60 >60 mL/min    Comment: (NOTE) The eGFR has been calculated using the CKD EPI equation. This calculation has not been validated in all clinical situations. eGFR's persistently <60 mL/min signify possible Chronic Kidney Disease.    Anion gap 8 5 - 15    Comment: Performed at [facility], [address] [place],  [address]  CBC     Status: Abnormal   Collection Time: 03/03/18  7:26 AM  Result Value Ref Range   WBC 6.4 4.0 - 10.5 K/uL   RBC 4.18 (L) 4.22 - 5.81 MIL/uL   Hemoglobin 10.5 (L) 13.0 - 17.0 g/dL   HCT 33.7 (L) 39.0 - 52.0 %   MCV 80.6 80.0 - 100.0 fL   MCH 25.1 (L) 26.0 - 34.0 pg   MCHC 31.2 30.0 - 36.0 g/dL   RDW 16.0 (H) 11.5 - 15.5 %   Platelets 225 150 - 400 K/uL   nRBC 0.0 0.0 - 0.2 %    Comment: Performed at [facility], [address] [place], [place] [address]  Vancomycin, trough     Status: None   Collection Time: 03/03/18  7:26 AM  Result Value Ref Range   Vancomycin Tr 17 15 - 20 ug/mL    Comment: Performed at [facility], [address] [place], [place] [address]  Glucose, capillary     Status: Abnormal   Collection Time: 03/03/18 12:02 PM  Result Value Ref Range   Glucose-Capillary 211 (H) 70 - 99 mg/dL   Glucose, capillary     Status: Abnormal   Collection Time: 03/03/18  4:43 PM  Result Value Ref Range   Glucose-Capillary 212 (H) 70 - 99 mg/dL   Comment 1 Notify RN   Glucose, capillary     Status: Abnormal   Collection Time: 03/03/18  9:12 PM  Result Value Ref Range   Glucose-Capillary 204 (H) 70 - 99 mg/dL   Comment 1 Notify RN   Glucose, capillary     Status: Abnormal   Collection Time: 03/03/18 11:42 PM  Result Value Ref Range   Glucose-Capillary 213 (H) 70 - 99 mg/dL   Comment 1 Notify RN   Glucose, capillary     Status: Abnormal   Collection Time: 03/04/18  6:55 AM  Result Value Ref Range   Glucose-Capillary 230 (H) 70 - 99 mg/dL   Comment 1 Notify RN   Glucose, capillary     Status: Abnormal   Collection Time: 03/04/18 11:45 AM  Result Value Ref Range   Glucose-Capillary 212 (H) 70 - 99 mg/dL  Glucose, capillary     Status: Abnormal   Collection Time: 03/04/18  4:50 PM  Result Value Ref Range   Glucose-Capillary 186 (H) 70 - 99 mg/dL   Comment 1 Notify RN   Glucose, capillary     Status: Abnormal   Collection Time: 03/04/18  9:09 PM  Result Value Ref Range   Glucose-Capillary 147 (H) 70 - 99 mg/dL   Comment 1 Notify RN   Glucose, capillary     Status: Abnormal   Collection Time: 03/05/18  7:56 AM  Result Value Ref Range   Glucose-Capillary 188 (H) 70 - 99 mg/dL   Comment 1 Notify RN   Glucose, capillary     Status: Abnormal   Collection Time: 03/05/18 11:40 AM  Result Value Ref Range   Glucose-Capillary 172 (H) 70 - 99 mg/dL   Comment 1 Notify RN   Glucose, capillary     Status: Abnormal   Collection Time: 03/05/18  2:04 PM  Result Value Ref Range   Glucose-Capillary 174 (H) 70 - 99 mg/dL  Glucose, capillary     Status: Abnormal   Collection Time: 03/05/18  4:13 PM  Result Value Ref Range   Glucose-Capillary 159 (H) 70 - 99 mg/dL  Glucose, capillary  Status: Abnormal   Collection Time: 03/05/18  9:26 PM  Result Value Ref Range   Glucose-Capillary 126  (H) 70 - 99 mg/dL   Comment 1 Notify RN   Basic metabolic panel     Status: Abnormal   Collection Time: 03/06/18  4:11 AM  Result Value Ref Range   Sodium 141 135 - 145 mmol/L   Potassium 3.8 3.5 - 5.1 mmol/L   Chloride 106 98 - 111 mmol/L   CO2 30 22 - 32 mmol/L   Glucose, Bld 206 (H) 70 - 99 mg/dL   BUN 19 8 - 23 mg/dL   Creatinine, Ser 0.86 0.61 - 1.24 mg/dL   Calcium 8.4 (L) 8.9 - 10.3 mg/dL   GFR calc non Af Amer >60 >60 mL/min   GFR calc Af Amer >60 >60 mL/min    Comment: (NOTE) The eGFR has been calculated using the CKD EPI equation. This calculation has not been validated in all clinical situations. eGFR's persistently <60 mL/min signify possible Chronic Kidney Disease.    Anion gap 5 5 - 15    Comment: Performed at [facility], [address] [place], [place] [address]  Glucose, capillary     Status: Abnormal   Collection Time: 03/06/18  7:57 AM  Result Value Ref Range   Glucose-Capillary 173 (H) 70 - 99 mg/dL   Comment 1 Notify RN   Glucose, capillary     Status: Abnormal   Collection Time: 03/06/18 11:39 AM  Result Value Ref Range   Glucose-Capillary 170 (H) 70 - 99 mg/dL   Comment 1 Notify RN   Glucose, capillary     Status: Abnormal   Collection Time: 03/06/18  4:48 PM  Result Value Ref Range   Glucose-Capillary 175 (H) 70 - 99 mg/dL   Comment 1 Notify RN   Glucose, capillary     Status: Abnormal   Collection Time: 03/06/18  9:16 PM  Result Value Ref Range   Glucose-Capillary 180 (H) 70 - 99 mg/dL  Vancomycin, trough     Status: None   Collection Time: 03/07/18  4:19 AM  Result Value Ref Range   Vancomycin Tr 17 15 - 20 ug/mL    Comment: Performed at [facility], [address] [place], [place] [address]  Basic metabolic panel     Status: Abnormal   Collection Time: 03/07/18  4:19 AM  Result Value Ref Range   Sodium 141 135 - 145 mmol/L   Potassium 3.6 3.5 - 5.1 mmol/L   Chloride 106 98 - 111 mmol/L   CO2 27 22 - 32 mmol/L    Glucose, Bld 244 (H) 70 - 99 mg/dL   BUN 18 8 - 23 mg/dL   Creatinine, Ser 0.88 0.61 - 1.24 mg/dL   Calcium 8.5 (L) 8.9 - 10.3 mg/dL   GFR calc non Af Amer >60 >60 mL/min   GFR calc Af Amer >60 >60 mL/min    Comment: (NOTE) The eGFR has been calculated using the CKD EPI equation. This calculation has not been validated in all clinical situations. eGFR's persistently <60 mL/min signify possible Chronic Kidney Disease.    Anion gap 8 5 - 15    Comment: Performed at [facility], [address] [place], [place] [address]  Glucose, capillary     Status: Abnormal   Collection Time: 03/07/18  7:25 AM  Result Value Ref Range   Glucose-Capillary 175 (H) 70 - 99 mg/dL  Glucose, capillary  Status: Abnormal   Collection Time: 03/07/18 11:50 AM  Result Value Ref Range   Glucose-Capillary 155 (H) 70 - 99 mg/dL  Glucose, capillary     Status: Abnormal   Collection Time: 03/07/18  4:27 PM  Result Value Ref Range   Glucose-Capillary 174 (H) 70 - 99 mg/dL  Glucose, capillary     Status: Abnormal   Collection Time: 03/07/18  9:07 PM  Result Value Ref Range   Glucose-Capillary 152 (H) 70 - 99 mg/dL  CBC     Status: Abnormal   Collection Time: 03/08/18  4:34 AM  Result Value Ref Range   WBC 5.1 4.0 - 10.5 K/uL   RBC 3.73 (L) 4.22 - 5.81 MIL/uL   Hemoglobin 9.4 (L) 13.0 - 17.0 g/dL   HCT 30.1 (L) 39.0 - 52.0 %   MCV 80.7 80.0 - 100.0 fL   MCH 25.2 (L) 26.0 - 34.0 pg   MCHC 31.2 30.0 - 36.0 g/dL   RDW 16.0 (H) 11.5 - 15.5 %   Platelets 277 150 - 400 K/uL   nRBC 0.0 0.0 - 0.2 %    Comment: Performed at [facility], [address] [place], [place] [address]  Basic metabolic panel     Status: Abnormal   Collection Time: 03/08/18  4:34 AM  Result Value Ref Range   Sodium 141 135 - 145 mmol/L   Potassium 3.9 3.5 - 5.1 mmol/L   Chloride 106 98 - 111 mmol/L   CO2 30 22 - 32 mmol/L   Glucose, Bld 189 (H) 70 - 99 mg/dL   BUN 17 8 - 23 mg/dL   Creatinine, Ser 0.86 0.61  - 1.24 mg/dL   Calcium 8.5 (L) 8.9 - 10.3 mg/dL   GFR calc non Af Amer >60 >60 mL/min   GFR calc Af Amer >60 >60 mL/min    Comment: (NOTE) The eGFR has been calculated using the CKD EPI equation. This calculation has not been validated in all clinical situations. eGFR's persistently <60 mL/min signify possible Chronic Kidney Disease.    Anion gap 5 5 - 15    Comment: Performed at [facility], [address] [place], [place] [address]  Magnesium     Status: None   Collection Time: 03/08/18  4:34 AM  Result Value Ref Range   Magnesium 2.0 1.7 - 2.4 mg/dL    Comment: Performed at [facility], [address] [place], [place] [address]  Vancomycin, trough     Status: None   Collection Time: 03/08/18  7:38 AM  Result Value Ref Range   Vancomycin Tr 20 15 - 20 ug/mL    Comment: Performed at [facility], [address] [place], [place] [address]  Glucose, capillary     Status: Abnormal   Collection Time: 03/08/18  7:52 AM  Result Value Ref Range   Glucose-Capillary 167 (H) 70 - 99 mg/dL  Glucose, capillary     Status: Abnormal   Collection Time: 03/08/18  9:32 AM  Result Value Ref Range   Glucose-Capillary 166 (H) 70 - 99 mg/dL  Glucose, capillary     Status: Abnormal   Collection Time: 03/08/18 12:31 PM  Result Value Ref Range   Glucose-Capillary 164 (H) 70 - 99 mg/dL  Glucose, capillary     Status: Abnormal   Collection Time: 03/08/18  4:55 PM  Result Value Ref Range   Glucose-Capillary 157 (H) 70 - 99 mg/dL  Surgical pathology     Status:  None   Collection Time: 03/08/18  6:14 PM  Result Value Ref Range   SURGICAL PATHOLOGY      Surgical Pathology CASE: [id number] PATIENT: Victor Wise Surgical Pathology Report     SPECIMEN SUBMITTED: A. Toe joint, left great  CLINICAL HISTORY: None provided  PRE-OPERATIVE DIAGNOSIS: Exposed bone and ulceration on left foot  POST-OPERATIVE DIAGNOSIS: Same as pre-op     DIAGNOSIS:  A.   TOE JOINT, LEFT GREAT; EXCISION AND DEBRIDEMENT: - NECROTIC SOFT TISSUE WITH ULCERATION. - DEGENERATED BONE AND ARTICULAR CARTILAGE WITH FOCAL ACUTE OSTEOMYELITIS. - NEGATIVE FOR MALIGNANCY.  GROSS DESCRIPTION: A. Labeled: Left great toe joint Received: In formalin Tissue fragment(s): 2 Size: 2.4 x 2.2 x 1.3 cm and 1.9 x 1.6 x 0.9 cm Description: Both fragments are tan-brown bony, one with a convex articular surface and one with a concave articular surface, the surface opposite the articular surface of the fragments is a smooth cut surface, smooth cut surfaces are differentially inked and sectioned Representative submitted in two cassettes.    Final [employer] performed by Moises Blood, MD.   Electronically signed 03/12/2018 12:53:58PM The electronic signature indicates that the named Attending Pathologist has evaluated the specimen  Technical component performed at [facility], [address], [place], [place] [address] Lab: [phone] Dir: Rush Farmer, MD, MMM  Professional component performed at [facility], [address], [place], [place] [address] Lab: [phone] Dir: Dellia Nims. Rubinas, MD   Glucose, capillary     Status: Abnormal   Collection Time: 03/08/18  6:58 PM  Result Value Ref Range   Glucose-Capillary 151 (H) 70 - 99 mg/dL  Glucose, capillary     Status: Abnormal   Collection Time: 03/08/18  9:20 PM  Result Value Ref Range   Glucose-Capillary 145 (H) 70 - 99 mg/dL   Comment 1 Notify RN   CBC     Status: Abnormal   Collection Time: 03/09/18  4:41 AM  Result Value Ref Range   WBC 5.2 4.0 - 10.5 K/uL   RBC 3.49 (L) 4.22 - 5.81 MIL/uL   Hemoglobin 8.7 (L) 13.0 - 17.0 g/dL   HCT 28.4 (L) 39.0 - 52.0 %   MCV 81.4 80.0 - 100.0 fL   MCH 24.9 (L) 26.0 - 34.0 pg   MCHC 30.6 30.0 - 36.0 g/dL   RDW 16.2 (H) 11.5 - 15.5 %   Platelets 269 150 - 400 K/uL   nRBC 0.0 0.0 - 0.2 %    Comment: Performed at [facility], [address] [place], [place] [address]  Basic metabolic panel     Status: Abnormal   Collection Time: 03/09/18  4:41 AM  Result Value Ref Range   Sodium 143 135 - 145 mmol/L   Potassium 3.6 3.5 - 5.1 mmol/L   Chloride 108 98 - 111 mmol/L   CO2 27 22 - 32 mmol/L   Glucose, Bld 155 (H) 70 - 99 mg/dL   BUN 17 8 - 23 mg/dL   Creatinine, Ser 0.77 0.61 - 1.24 mg/dL   Calcium 8.0 (L) 8.9 - 10.3 mg/dL   GFR calc non Af Amer >60 >60 mL/min   GFR calc Af Amer >60 >60 mL/min    Comment: (NOTE) The eGFR has been calculated using the CKD EPI equation. This calculation has not been validated in all clinical situations. eGFR's persistently <60 mL/min signify possible Chronic Kidney Disease.    Anion gap 8 5 - 15    Comment: Performed  at [facility], [address] [place], [place] [address]  Glucose, capillary     Status: Abnormal   Collection Time: 03/09/18  8:25 AM  Result Value Ref Range   Glucose-Capillary 139 (H) 70 - 99 mg/dL  Glucose, capillary     Status: Abnormal   Collection Time: 03/09/18 12:02 PM  Result Value Ref Range   Glucose-Capillary 197 (H) 70 - 99 mg/dL  Glucose, capillary     Status: Abnormal   Collection Time: 03/09/18  4:50 PM  Result Value Ref Range   Glucose-Capillary 194 (H) 70 - 99 mg/dL  Glucose, capillary     Status: Abnormal   Collection Time: 03/09/18  9:03 PM  Result Value Ref Range   Glucose-Capillary 171 (H) 70 - 99 mg/dL  CBC     Status: Abnormal   Collection Time: 03/10/18  4:29 AM  Result Value Ref Range   WBC 5.2 4.0 - 10.5 K/uL   RBC 3.57 (L) 4.22 - 5.81 MIL/uL   Hemoglobin 8.9 (L) 13.0 - 17.0 g/dL   HCT 28.7 (L) 39.0 - 52.0 %   MCV 80.4 80.0 - 100.0 fL   MCH 24.9 (L) 26.0 - 34.0 pg   MCHC 31.0 30.0 - 36.0 g/dL   RDW 16.0 (H) 11.5 - 15.5 %   Platelets 261 150 - 400 K/uL   nRBC 0.0 0.0 - 0.2 %    Comment: Performed at [facility], [address] [place], [place] [address]  Basic metabolic panel     Status: Abnormal    Collection Time: 03/10/18  4:29 AM  Result Value Ref Range   Sodium 142 135 - 145 mmol/L   Potassium 3.3 (L) 3.5 - 5.1 mmol/L   Chloride 107 98 - 111 mmol/L   CO2 29 22 - 32 mmol/L   Glucose, Bld 203 (H) 70 - 99 mg/dL   BUN 16 8 - 23 mg/dL   Creatinine, Ser 0.87 0.61 - 1.24 mg/dL   Calcium 8.2 (L) 8.9 - 10.3 mg/dL   GFR calc non Af Amer >60 >60 mL/min   GFR calc Af Amer >60 >60 mL/min    Comment: (NOTE) The eGFR has been calculated using the CKD EPI equation. This calculation has not been validated in all clinical situations. eGFR's persistently <60 mL/min signify possible Chronic Kidney Disease.    Anion gap 6 5 - 15    Comment: Performed at [facility], [address] [place], [place] [address]  Glucose, capillary     Status: Abnormal   Collection Time: 03/10/18  8:24 AM  Result Value Ref Range   Glucose-Capillary 167 (H) 70 - 99 mg/dL   Comment 1 Notify RN   Glucose, capillary     Status: Abnormal   Collection Time: 03/10/18 11:52 AM  Result Value Ref Range   Glucose-Capillary 171 (H) 70 - 99 mg/dL   Comment 1 Notify RN   Glucose, capillary     Status: Abnormal   Collection Time: 03/10/18  4:57 PM  Result Value Ref Range   Glucose-Capillary 219 (H) 70 - 99 mg/dL   Comment 1 Notify RN   Glucose, capillary     Status: Abnormal   Collection Time: 03/10/18  9:04 PM  Result Value Ref Range   Glucose-Capillary 226 (H) 70 - 99 mg/dL   Comment 1 Notify RN   Glucose, capillary     Status: Abnormal   Collection Time: 03/11/18  7:57 AM  Result Value Ref Range  Glucose-Capillary 169 (H) 70 - 99 mg/dL   Comment 1 Notify RN   Glucose, capillary     Status: Abnormal   Collection Time: 03/11/18 11:56 AM  Result Value Ref Range   Glucose-Capillary 195 (H) 70 - 99 mg/dL   Comment 1 Notify RN   Glucose, capillary     Status: Abnormal   Collection Time: 03/11/18  5:14 PM  Result Value Ref Range   Glucose-Capillary 167 (H) 70 - 99 mg/dL   Comment 1 Notify RN    Glucose, capillary     Status: Abnormal   Collection Time: 03/11/18  9:05 PM  Result Value Ref Range   Glucose-Capillary 183 (H) 70 - 99 mg/dL   Comment 1 Notify RN   Glucose, capillary     Status: Abnormal   Collection Time: 03/12/18  7:56 AM  Result Value Ref Range   Glucose-Capillary 182 (H) 70 - 99 mg/dL  Glucose, capillary     Status: Abnormal   Collection Time: 03/12/18 11:56 AM  Result Value Ref Range   Glucose-Capillary 201 (H) 70 - 99 mg/dL  Glucose, capillary     Status: Abnormal   Collection Time: 03/12/18  5:17 PM  Result Value Ref Range   Glucose-Capillary 215 (H) 70 - 99 mg/dL  Glucose, capillary     Status: Abnormal   Collection Time: 03/12/18  9:09 PM  Result Value Ref Range   Glucose-Capillary 220 (H) 70 - 99 mg/dL   Comment 1 Notify RN   Glucose, capillary     Status: Abnormal   Collection Time: 03/13/18  8:28 AM  Result Value Ref Range   Glucose-Capillary 197 (H) 70 - 99 mg/dL  Glucose, capillary     Status: Abnormal   Collection Time: 03/13/18 12:16 PM  Result Value Ref Range   Glucose-Capillary 232 (H) 70 - 99 mg/dL  Glucose, capillary     Status: Abnormal   Collection Time: 03/13/18  4:53 PM  Result Value Ref Range   Glucose-Capillary 228 (H) 70 - 99 mg/dL  CBC     Status: Abnormal   Collection Time: 04/18/18  5:54 PM  Result Value Ref Range   WBC 5.9 4.0 - 10.5 K/uL   RBC 4.29 4.22 - 5.81 MIL/uL   Hemoglobin 10.8 (L) 13.0 - 17.0 g/dL   HCT 34.3 (L) 39.0 - 52.0 %   MCV 80.0 80.0 - 100.0 fL   MCH 25.2 (L) 26.0 - 34.0 pg   MCHC 31.5 30.0 - 36.0 g/dL   RDW 16.5 (H) 11.5 - 15.5 %   Platelets 318 150 - 400 K/uL   nRBC 0.0 0.0 - 0.2 %    Comment: Performed at [facility], [address] [place], [place] [address]  Comprehensive metabolic panel     Status: Abnormal   Collection Time: 04/18/18  5:54 PM  Result Value Ref Range   Sodium 136 135 - 145 mmol/L   Potassium 4.1 3.5 - 5.1 mmol/L   Chloride 100 98 - 111 mmol/L   CO2 27 22 - 32  mmol/L   Glucose, Bld 346 (H) 70 - 99 mg/dL   BUN 21 8 - 23 mg/dL   Creatinine, Ser 0.93 0.61 - 1.24 mg/dL   Calcium 8.7 (L) 8.9 - 10.3 mg/dL   Total Protein 6.7 6.5 - 8.1 g/dL   Albumin 3.5 3.5 - 5.0 g/dL   AST 18 15 - 41 U/L   ALT 15 0 - 44 U/L   Alkaline Phosphatase  67 38 - 126 U/L   Total Bilirubin 0.3 0.3 - 1.2 mg/dL   GFR calc non Af Amer >60 >60 mL/min   GFR calc Af Amer >60 >60 mL/min   Anion gap 9 5 - 15    Comment: Performed at [facility], [address] [place], [place] [address]  Glucose, capillary     Status: Abnormal   Collection Time: 04/18/18  9:17 PM  Result Value Ref Range   Glucose-Capillary 274 (H) 70 - 99 mg/dL   Comment 1 Notify RN   Basic metabolic panel     Status: Abnormal   Collection Time: 04/19/18  3:30 AM  Result Value Ref Range   Sodium 139 135 - 145 mmol/L   Potassium 3.8 3.5 - 5.1 mmol/L   Chloride 104 98 - 111 mmol/L   CO2 29 22 - 32 mmol/L   Glucose, Bld 301 (H) 70 - 99 mg/dL   BUN 18 8 - 23 mg/dL   Creatinine, Ser 0.84 0.61 - 1.24 mg/dL   Calcium 8.5 (L) 8.9 - 10.3 mg/dL   GFR calc non Af Amer >60 >60 mL/min   GFR calc Af Amer >60 >60 mL/min   Anion gap 6 5 - 15    Comment: Performed at [facility], [address] [place], [place] [address]  CBC     Status: Abnormal   Collection Time: 04/19/18  3:30 AM  Result Value Ref Range   WBC 4.4 4.0 - 10.5 K/uL   RBC 4.08 (L) 4.22 - 5.81 MIL/uL   Hemoglobin 10.2 (L) 13.0 - 17.0 g/dL   HCT 32.5 (L) 39.0 - 52.0 %   MCV 79.7 (L) 80.0 - 100.0 fL   MCH 25.0 (L) 26.0 - 34.0 pg   MCHC 31.4 30.0 - 36.0 g/dL   RDW 16.3 (H) 11.5 - 15.5 %   Platelets 271 150 - 400 K/uL   nRBC 0.0 0.0 - 0.2 %    Comment: Performed at [facility], [address], [place], [place] [address]  Aerobic/Anaerobic Culture (surgical/deep wound)     Status: None   Collection Time: 04/19/18  8:28 AM  Result Value Ref Range   Specimen Description      FOOT LEFT Performed at [facility], [address] [place], [place] [address]    Special Requests      Normal Performed at [facility], [address] [place], [place] [address]    Gram Stain      RARE WBC PRESENT, PREDOMINANTLY PMN RARE GRAM POSITIVE COCCI    Culture      FEW ENTEROCOCCUS FAECALIS NO ANAEROBES ISOLATED Performed at [facility], [place] [address] [place], [place] [address]    Report Status 04/24/2018 FINAL    Organism ID, Bacteria ENTEROCOCCUS FAECALIS       Susceptibility   Enterococcus faecalis - MIC*    AMPICILLIN <=2 SENSITIVE Sensitive     VANCOMYCIN 1 SENSITIVE Sensitive     GENTAMICIN SYNERGY RESISTANT Resistant     * FEW ENTEROCOCCUS FAECALIS  Glucose, capillary     Status: Abnormal   Collection Time: 04/19/18  8:43 AM  Result Value Ref Range   Glucose-Capillary 148 (H) 70 - 99 mg/dL  Glucose, capillary     Status: Abnormal   Collection Time: 04/19/18 11:44 AM  Result Value Ref Range   Glucose-Capillary 155 (H) 70 - 99 mg/dL  Glucose, capillary     Status: None  Collection Time: 04/19/18  4:51 PM  Result Value Ref Range   Glucose-Capillary 98 70 - 99 mg/dL  Surgical pathology     Status: None   Collection Time: 04/19/18  7:54 PM  Result Value Ref Range   SURGICAL PATHOLOGY      Surgical Pathology CASE: [id number] PATIENT: Victor Wise Surgical Pathology Report     SPECIMEN SUBMITTED: A. Great toe, left first metatarsal; amputation  CLINICAL HISTORY: None provided  PRE-OPERATIVE DIAGNOSIS: Chronic cellulitis and abscess with osteomyelitis  POST-OPERATIVE DIAGNOSIS: Same as pre-op     DIAGNOSIS: A. LEFT GREAT TOE, BONE, LEFT FIRST METATARSAL; AMPUTATION: - SKIN WITH ULCERATION AND INFLAMED GRANULATION TISSUE. - BONE WITH OSTEOMYELITIS EXTENDING TO MARGIN OF RESECTION.   GROSS DESCRIPTION: Open a. Labeled: Left great toe amputation and first metatarsal Received: In formalin Size: 5.3 x 3.2 x 2.5 cm Description of lesion(s): 0.9 x 0.6  cm ulcer at the most distal aspect Proximal margin: Ulcer is 1.9 cm from closest skin margin Bone: Bone at margin is articular surface with a 2.3 x 2.2 x 1.3 cm bone from proximal to distal articular surface, most proximal aspect of the bone is marked orange and remaining margin green Other fin dings: The bone at the proximal aspect is removed for decalcification and sectioning  Block summary: 1 - perpendicular margin of skin 2 - representative ulcer with underlying bone 3-4 - representative perpendicular sections of proximal bone  Tissue decalcification: 2-4    Final Diagnosis performed by Raynelle Bring, MD.   Electronically signed 04/24/2018 1:18:58PM The electronic signature indicates that the named Attending Pathologist has evaluated the specimen  Technical component performed at [place], [address], [place], [name] [address] Lab: [phone] Dir: Rush Farmer, MD, MMM  Professional component performed at [facility], [facility], [place], [place], [place] [address] Lab: [phone] Dir: Dellia Nims. Rubinas, MD   Aerobic/Anaerobic Culture (surgical/deep wound)     Status: None   Collection Time: 04/19/18  7:58 PM  Result Value Ref Range   Specimen Description      BONE BX Performed at [facility], [address], [place], [place] [address]    Special Requests      NONE Performed at [facility], [address] [place], [place] [address]    Gram Stain      RARE WBC PRESENT, PREDOMINANTLY MONONUCLEAR RARE GRAM POSITIVE COCCI RARE GRAM NEGATIVE RODS    Culture      RARE KLEBSIELLA PNEUMONIAE FEW VANCOMYCIN RESISTANT ENTEROCOCCUS NO ANAEROBES ISOLATED Performed at [facility], [place] [address], [place],  [address]    Report Status 04/24/2018 FINAL    Organism ID, Bacteria KLEBSIELLA PNEUMONIAE    Organism ID, Bacteria VANCOMYCIN RESISTANT ENTEROCOCCUS       Susceptibility   Klebsiella pneumoniae - MIC*     AMPICILLIN >=32 RESISTANT Resistant     CEFAZOLIN >=64 RESISTANT Resistant     CEFEPIME <=1 SENSITIVE Sensitive     CEFTAZIDIME 4 SENSITIVE Sensitive     CEFTRIAXONE 8 SENSITIVE Sensitive     CIPROFLOXACIN <=0.25 SENSITIVE Sensitive     GENTAMICIN <=1 SENSITIVE Sensitive     IMIPENEM <=0.25 SENSITIVE Sensitive     TRIMETH/SULFA <=20 SENSITIVE Sensitive     AMPICILLIN/SULBACTAM RESISTANT Resistant     PIP/TAZO <=4 SENSITIVE Sensitive     Extended ESBL NEGATIVE Sensitive     * RARE KLEBSIELLA PNEUMONIAE   Vancomycin resistant enterococcus - MIC*    AMPICILLIN >=32  RESISTANT Resistant     VANCOMYCIN >=32 RESISTANT Resistant     GENTAMICIN SYNERGY SENSITIVE Sensitive     LINEZOLID 2 SENSITIVE Sensitive     * FEW VANCOMYCIN RESISTANT ENTEROCOCCUS  Glucose, capillary     Status: Abnormal   Collection Time: 04/19/18  8:56 PM  Result Value Ref Range   Glucose-Capillary 122 (H) 70 - 99 mg/dL  CBC     Status: Abnormal   Collection Time: 04/20/18  4:26 AM  Result Value Ref Range   WBC 5.0 4.0 - 10.5 K/uL   RBC 3.90 (L) 4.22 - 5.81 MIL/uL   Hemoglobin 9.7 (L) 13.0 - 17.0 g/dL   HCT 31.6 (L) 39.0 - 52.0 %   MCV 81.0 80.0 - 100.0 fL   MCH 24.9 (L) 26.0 - 34.0 pg   MCHC 30.7 30.0 - 36.0 g/dL   RDW 16.3 (H) 11.5 - 15.5 %   Platelets 255 150 - 400 K/uL   nRBC 0.0 0.0 - 0.2 %    Comment: Performed at [facility], [address] [place], [place] [address]  Basic metabolic panel     Status: Abnormal   Collection Time: 04/20/18  4:26 AM  Result Value Ref Range   Sodium 138 135 - 145 mmol/L   Potassium 3.8 3.5 - 5.1 mmol/L   Chloride 107 98 - 111 mmol/L   CO2 28 22 - 32 mmol/L   Glucose, Bld 244 (H) 70 - 99 mg/dL   BUN 16 8 - 23 mg/dL   Creatinine, Ser 0.83 0.61 - 1.24 mg/dL   Calcium 7.8 (L) 8.9 - 10.3 mg/dL   GFR calc non Af Amer >60 >60 mL/min   GFR calc Af Amer >60 >60 mL/min   Anion gap 3 (L) 5 - 15    Comment: Performed at [facility], [address]  [place], [place] [address]  Glucose, capillary     Status: Abnormal   Collection Time: 04/20/18  7:43 AM  Result Value Ref Range   Glucose-Capillary 224 (H) 70 - 99 mg/dL   Comment 1 Notify RN   Glucose, capillary     Status: Abnormal   Collection Time: 04/20/18 12:33 PM  Result Value Ref Range   Glucose-Capillary 273 (H) 70 - 99 mg/dL   Comment 1 Notify RN   Glucose, capillary     Status: Abnormal   Collection Time: 04/20/18  4:56 PM  Result Value Ref Range   Glucose-Capillary 275 (H) 70 - 99 mg/dL   Comment 1 Notify RN   Glucose, capillary     Status: Abnormal   Collection Time: 04/20/18 10:07 PM  Result Value Ref Range   Glucose-Capillary 278 (H) 70 - 99 mg/dL  Basic metabolic panel     Status: Abnormal   Collection Time: 04/21/18  6:00 AM  Result Value Ref Range   Sodium 139 135 - 145 mmol/L   Potassium 3.8 3.5 - 5.1 mmol/L   Chloride 106 98 - 111 mmol/L   CO2 29 22 - 32 mmol/L   Glucose, Bld 277 (H) 70 - 99 mg/dL   BUN 15 8 - 23 mg/dL   Creatinine, Ser 0.79 0.61 - 1.24 mg/dL   Calcium 8.1 (L) 8.9 - 10.3 mg/dL   GFR calc non Af Amer >60 >60 mL/min   GFR calc Af Amer >60 >60 mL/min   Anion gap 4 (L) 5 - 15    Comment: Performed at [facility], [address] [place],  [place] [address]  Glucose, capillary     Status: Abnormal   Collection Time: 04/21/18  7:54 AM  Result Value Ref Range   Glucose-Capillary 252 (H) 70 - 99 mg/dL  Glucose, capillary     Status: Abnormal   Collection Time: 04/21/18 11:26 AM  Result Value Ref Range   Glucose-Capillary 242 (H) 70 - 99 mg/dL  Glucose, capillary     Status: Abnormal   Collection Time: 04/21/18  5:02 PM  Result Value Ref Range   Glucose-Capillary 270 (H) 70 - 99 mg/dL  Glucose, capillary     Status: Abnormal   Collection Time: 04/21/18  8:45 PM  Result Value Ref Range   Glucose-Capillary 243 (H) 70 - 99 mg/dL  Glucose, capillary     Status: Abnormal   Collection Time: 04/22/18  7:43 AM  Result Value Ref Range    Glucose-Capillary 198 (H) 70 - 99 mg/dL  Glucose, capillary     Status: Abnormal   Collection Time: 04/22/18 11:42 AM  Result Value Ref Range   Glucose-Capillary 197 (H) 70 - 99 mg/dL  Glucose, capillary     Status: Abnormal   Collection Time: 04/22/18  4:45 PM  Result Value Ref Range   Glucose-Capillary 239 (H) 70 - 99 mg/dL  Glucose, capillary     Status: Abnormal   Collection Time: 04/22/18  9:23 PM  Result Value Ref Range   Glucose-Capillary 316 (H) 70 - 99 mg/dL  CBC     Status: Abnormal   Collection Time: 04/23/18  5:31 AM  Result Value Ref Range   WBC 4.8 4.0 - 10.5 K/uL   RBC 4.15 (L) 4.22 - 5.81 MIL/uL   Hemoglobin 10.1 (L) 13.0 - 17.0 g/dL   HCT 32.8 (L) 39.0 - 52.0 %   MCV 79.0 (L) 80.0 - 100.0 fL   MCH 24.3 (L) 26.0 - 34.0 pg   MCHC 30.8 30.0 - 36.0 g/dL   RDW 15.9 (H) 11.5 - 15.5 %   Platelets 272 150 - 400 K/uL   nRBC 0.0 0.0 - 0.2 %    Comment: Performed at [facility], [address] [place], [place] [address]  Creatinine, serum     Status: None   Collection Time: 04/23/18  5:31 AM  Result Value Ref Range   Creatinine, Ser 0.75 0.61 - 1.24 mg/dL   GFR calc non Af Amer >60 >60 mL/min   GFR calc Af Amer >60 >60 mL/min    Comment: Performed at [facility], [address] [place],  [address]  CK     Status: Abnormal   Collection Time: 04/23/18  5:31 AM  Result Value Ref Range   Total CK 14 (L) 49 - 397 U/L    Comment: Performed at [facility], [address] [place],  [address]  Glucose, capillary     Status: Abnormal   Collection Time: 04/23/18  7:43 AM  Result Value Ref Range   Glucose-Capillary 209 (H) 70 - 99 mg/dL  Glucose, capillary     Status: Abnormal   Collection Time: 04/23/18 11:48 AM  Result Value Ref Range   Glucose-Capillary 259 (H) 70 - 99 mg/dL  Glucose, capillary     Status: Abnormal   Collection Time: 04/23/18  4:34 PM  Result Value Ref Range   Glucose-Capillary 231 (H) 70 - 99 mg/dL   Glucose, capillary     Status: Abnormal   Collection Time: 05/02/18  6:07 PM  Result Value Ref Range  Glucose-Capillary 234 (H) 70 - 99 mg/dL  CBC with Differential     Status: Abnormal   Collection Time: 05/02/18  6:11 PM  Result Value Ref Range   WBC 5.7 4.0 - 10.5 K/uL   RBC 4.74 4.22 - 5.81 MIL/uL   Hemoglobin 11.4 (L) 13.0 - 17.0 g/dL   HCT 36.7 (L) 39.0 - 52.0 %   MCV 77.4 (L) 80.0 - 100.0 fL   MCH 24.1 (L) 26.0 - 34.0 pg   MCHC 31.1 30.0 - 36.0 g/dL   RDW 15.7 (H) 11.5 - 15.5 %   Platelets 333 150 - 400 K/uL   nRBC 0.0 0.0 - 0.2 %   Neutrophils Relative % 45 %   Neutro Abs 2.6 1.7 - 7.7 K/uL   Lymphocytes Relative 40 %   Lymphs Abs 2.3 0.7 - 4.0 K/uL   Monocytes Relative 8 %   Monocytes Absolute 0.5 0.1 - 1.0 K/uL   Eosinophils Relative 5 %   Eosinophils Absolute 0.3 0.0 - 0.5 K/uL   Basophils Relative 2 %   Basophils Absolute 0.1 0.0 - 0.1 K/uL   Immature Granulocytes 0 %   Abs Immature Granulocytes 0.02 0.00 - 0.07 K/uL    Comment: Performed at [facility], [address] [place], [place] [address]  Comprehensive metabolic panel     Status: Abnormal   Collection Time: 05/02/18  6:11 PM  Result Value Ref Range   Sodium 138 135 - 145 mmol/L   Potassium 4.2 3.5 - 5.1 mmol/L   Chloride 100 98 - 111 mmol/L   CO2 31 22 - 32 mmol/L   Glucose, Bld 207 (H) 70 - 99 mg/dL   BUN 21 8 - 23 mg/dL   Creatinine, Ser 1.01 0.61 - 1.24 mg/dL   Calcium 9.4 8.9 - 10.3 mg/dL   Total Protein 7.6 6.5 - 8.1 g/dL   Albumin 4.0 3.5 - 5.0 g/dL   AST 17 15 - 41 U/L   ALT 15 0 - 44 U/L   Alkaline Phosphatase 61 38 - 126 U/L   Total Bilirubin 0.2 (L) 0.3 - 1.2 mg/dL   GFR calc non Af Amer >60 >60 mL/min   GFR calc Af Amer >60 >60 mL/min   Anion gap 7 5 - 15    Comment: Performed at [facility], [address] [place], [place] [address]  Glucose, capillary     Status: Abnormal   Collection Time: 05/02/18  9:00 PM  Result Value Ref Range   Glucose-Capillary  103 (H) 70 - 99 mg/dL  Aerobic Culture (superficial specimen)     Status: None (Preliminary result)   Collection Time: 05/15/18  2:20 PM  Result Value Ref Range   Specimen Description      FOOT Performed at [facility], [address], [place], [place] [address]    Special Requests      NONE Performed at [facility], [address] [place], [place] [address]    Gram Stain      RARE WBC PRESENT, PREDOMINANTLY PMN NO ORGANISMS SEEN Performed at [facility], [address] [place], [place] [address]    Culture RARE CANDIDA LUSITANIAE    Report Status PENDING     Radiology: No results found.  No results found.  Ct Head Wo Contrast  Result Date: 04/21/2018 CLINICAL DATA:  Muscle weakness, now resolving EXAM: CT HEAD WITHOUT CONTRAST TECHNIQUE: Contiguous axial images were obtained from the base of the skull  through the vertex without intravenous contrast. COMPARISON:  04/13/2016 FINDINGS: Brain: No evidence of acute infarction, hemorrhage, hydrocephalus, extra-axial collection or mass lesion/mass effect. Subcortical white matter and periventricular small vessel ischemic changes. 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 Vascular: Intracranial atherosclerosis. Skull: Normal. Negative for fracture or focal lesion. Sinuses/Orbits: Mild mucosal thickening of the bilateral ethmoid sinuses. Mastoid air cells are clear. Other: None. IMPRESSION: No evidence of acute intracranial abnormality. 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  Small vessel ischemic changes. Electronically Signed   By: Julian Hy M.D.   On: 04/21/2018 17:51      Assessment and Plan: Patient Active Problem List   Diagnosis Date Noted  . Diabetic ulcer of left foot ([place]) 04/18/2018  . Swelling of limb 04/06/2018  . Atherosclerosis of native arteries of the extremities with ulceration ([place]) 04/06/2018  . Edema of right lower extremity 04/04/2018  . Diabetic foot infection ([place])  03/01/2018  . Spinal cord stimulator status 01/12/2018  . Hypertension 09/05/2017  . OSA on CPAP 09/05/2017  . Uncontrolled type 2 diabetes mellitus with hyperglycemia ([place]) 08/02/2017  . Peripheral vascular disease ([place]) 08/02/2017  . Mixed hyperlipidemia 08/02/2017  . Dysuria 08/02/2017  . Obesity, Class III, BMI 40-49.9 (morbid obesity) ([place]) 08/10/2016  . Hemiparesis affecting left side as late effect of stroke ([place]) 05/09/2016  . Symptomatic bradycardia 03/21/2016  . Falls frequently 02/26/2016  . Acute renal failure (ARF) ([place]) 02/18/2016  . Hypotension 02/18/2016  . Sick sinus syndrome ([place]) 10/28/2015  . Syncopal episodes 10/26/2015  . Syncope 10/26/2015  . Adjustment disorder with mixed anxiety and depressed mood 10/20/2015  . Dysthymia 10/20/2015  . CVA (cerebral infarction) 10/19/2015  . Urinary retention 03/25/2015  . 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 CAD (coronary artery disease) 12/29/2014  . Acute MI, anterolateral wall, subsequent episode of care ([place]) 12/19/2014  . SIRS (systemic inflammatory response syndrome) ([place]) 12/18/2014  . Systemic inflammatory response syndrome (sirs) of non-infectious origin without acute organ dysfunction ([place]) 12/18/2014  . Encounter for monitoring opioid maintenance therapy 11/10/2014  . LVH (left ventricular hypertrophy) due to hypertensive disease, with heart failure ([place]) 10/02/2014  . Benign essential hypertension 09/26/2014  . Chronic diastolic CHF (congestive heart failure), NYHA class 2 ([place]) 06/03/2014  . Pain syndrome, chronic 03/02/2014  . Diabetes ([place]) 11/08/2013  . Chronic radicular low back pain 08/21/2013  . Right shoulder pain 04/03/2013  . Urinary tract infection 03/16/2012  . Balanoposthitis 02/13/2012  . History of urinary stone 02/13/2012  . Hypertrophy of prostate with urinary obstruction and other lower urinary tract symptoms (LUTS) 02/13/2012  . 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 Redundant prepuce and  phimosis 02/13/2012  . Urge incontinence 02/13/2012  . Right foot pain 01/18/2012  . Chronic, continuous use of opioids 08/22/2011   1. Obstructive chronic bronchitis without exacerbation ([place]) Stable, continue current medications as prescribed.  2. OSA on CPAP Patient will continue to wear CPAP machine at night.  He continues to report good results while wearing this.  He will continue to clean his machine as instructed.  He was switched tubing and filters as indicated.  3. Gastroesophageal reflux disease without esophagitis Stable, continue current medications.  4. Obesity, Class III, BMI 40-49.9 (morbid obesity) (HCC) Obesity Counseling: Risk Assessment: An assessment of behavioral risk factors was made today and includes lack of exercise sedentary lifestyle, lack of portion control and poor dietary habits.  Risk Modification Advice: She was counseled on portion control guidelines. Restricting daily caloric intake to. [name]  The detrimental long term effects of obesity on her health and ongoing poor compliance was also discussed with the patient.  5. Restless leg Continue current therapies.   General Counseling: I have discussed the findings of the evaluation and examination with Shanon Brow.  I have also discussed any further diagnostic evaluation thatmay be needed or ordered today. Shanon Brow verbalizes understanding of the findings of todays visit. We also reviewed his medications today and discussed drug interactions and side effects including but not limited excessive drowsiness and altered mental states. We also discussed that there is always a risk not just to him but also people around him. he has been encouraged to call the office with any questions or concerns that should arise related to todays visit.    Time spent: 25 This patient was seen by Orson Gear AGNP-C in Collaboration with Dr. Devona Konig as a part of collaborative care agreement.   I have personally obtained a history,  examined the patient, evaluated laboratory and imaging results, formulated the assessment and plan and placed orders.    Allyne Gee, MD [facility]monary and Critical Care Sleep medicine

## 2018-05-22 ENCOUNTER — Telehealth: Payer: Self-pay | Admitting: Licensed Clinical Social Worker

## 2018-05-22 ENCOUNTER — Other Ambulatory Visit: Payer: Self-pay | Admitting: Licensed Clinical Social Worker

## 2018-05-22 ENCOUNTER — Other Ambulatory Visit: Payer: Self-pay

## 2018-05-22 ENCOUNTER — Other Ambulatory Visit: Payer: Self-pay | Admitting: Infectious Diseases

## 2018-05-22 DIAGNOSIS — Z95820 Peripheral vascular angioplasty status with implants and grafts: Secondary | ICD-10-CM | POA: Diagnosis not present

## 2018-05-22 DIAGNOSIS — F341 Dysthymic disorder: Secondary | ICD-10-CM | POA: Diagnosis not present

## 2018-05-22 DIAGNOSIS — E1169 Type 2 diabetes mellitus with other specified complication: Secondary | ICD-10-CM | POA: Diagnosis not present

## 2018-05-22 DIAGNOSIS — L97521 Non-pressure chronic ulcer of other part of left foot limited to breakdown of skin: Secondary | ICD-10-CM | POA: Diagnosis not present

## 2018-05-22 DIAGNOSIS — M549 Dorsalgia, unspecified: Secondary | ICD-10-CM | POA: Diagnosis not present

## 2018-05-22 DIAGNOSIS — I10 Essential (primary) hypertension: Secondary | ICD-10-CM | POA: Diagnosis not present

## 2018-05-22 DIAGNOSIS — L03116 Cellulitis of left lower limb: Secondary | ICD-10-CM | POA: Diagnosis not present

## 2018-05-22 DIAGNOSIS — E11621 Type 2 diabetes mellitus with foot ulcer: Secondary | ICD-10-CM | POA: Diagnosis not present

## 2018-05-22 DIAGNOSIS — M62838 Other muscle spasm: Secondary | ICD-10-CM | POA: Diagnosis not present

## 2018-05-22 MED ORDER — PANTOPRAZOLE SODIUM 40 MG PO TBEC: DELAYED_RELEASE_TABLET | ORAL | 3 refills | Status: DC

## 2018-05-22 MED ORDER — FENOFIBRATE 54 MG PO TABS: ORAL_TABLET | ORAL | 5 refills | Status: DC

## 2018-05-22 MED ORDER — TAMSULOSIN HCL 0.4 MG PO CAPS: 0.4000 mg | ORAL_CAPSULE | Freq: Every day | ORAL | 3 refills | Status: DC

## 2018-05-22 MED ORDER — BENAZEPRIL HCL 20 MG PO TABS: 20.0000 mg | ORAL_TABLET | Freq: Two times a day (BID) | ORAL | 2 refills | Status: DC

## 2018-05-22 MED ORDER — FLUCONAZOLE 200 MG PO TABS: 400.0000 mg | ORAL_TABLET | Freq: Every day | ORAL | 0 refills | Status: DC

## 2018-05-22 NOTE — Telephone Encounter (Signed)
Order sent in for Fluconazole 200 mg  Take 2 tablets daily for 14 days

## 2018-05-22 NOTE — Telephone Encounter (Signed)
Patient called wanting to know the results of his foot culture.

## 2018-05-23 ENCOUNTER — Telehealth: Payer: Self-pay

## 2018-05-23 DIAGNOSIS — E11621 Type 2 diabetes mellitus with foot ulcer: Secondary | ICD-10-CM | POA: Diagnosis not present

## 2018-05-23 DIAGNOSIS — M549 Dorsalgia, unspecified: Secondary | ICD-10-CM | POA: Diagnosis not present

## 2018-05-23 DIAGNOSIS — I10 Essential (primary) hypertension: Secondary | ICD-10-CM | POA: Diagnosis not present

## 2018-05-23 DIAGNOSIS — L03116 Cellulitis of left lower limb: Secondary | ICD-10-CM | POA: Diagnosis not present

## 2018-05-23 DIAGNOSIS — E1169 Type 2 diabetes mellitus with other specified complication: Secondary | ICD-10-CM | POA: Diagnosis not present

## 2018-05-23 DIAGNOSIS — Z95820 Peripheral vascular angioplasty status with implants and grafts: Secondary | ICD-10-CM | POA: Diagnosis not present

## 2018-05-23 DIAGNOSIS — M62838 Other muscle spasm: Secondary | ICD-10-CM | POA: Diagnosis not present

## 2018-05-23 DIAGNOSIS — L97521 Non-pressure chronic ulcer of other part of left foot limited to breakdown of skin: Secondary | ICD-10-CM | POA: Diagnosis not present

## 2018-05-23 DIAGNOSIS — F341 Dysthymic disorder: Secondary | ICD-10-CM | POA: Diagnosis not present

## 2018-05-23 NOTE — Telephone Encounter (Signed)
I called patient and left message on voicemail that he has a prescription waiting at [facility] and explained that he will take two pills daily for 14 days. Also left his culture results on his voicemail.

## 2018-05-24 ENCOUNTER — Other Ambulatory Visit: Payer: Self-pay

## 2018-05-24 NOTE — Patient Outreach (Signed)
[facility]) Care Management  05/24/2018  Victor Wise [birth year] [id number]   Telephone Screen  Referral Date:05/11/18 Referral Source:[facility] Referral Reason:" DM, HTN, patient needs continuous nursing services for monitoring med and diet compliance" Insurance:[employer]    Multiple attempts to establish contact with patient without success. No response from letter mailed to patient. Case is being closed at this time.     Plan: RN CM will close case at this time.   Enzo Montgomery, RN,BSN,CCM [facility] Telephonic Care Management Coordinator Direct Phone: [phone] Toll Free: [phone] Fax: [phone]

## 2018-05-24 NOTE — Telephone Encounter (Signed)
Can We call him and set up an app with heather

## 2018-05-25 ENCOUNTER — Telehealth: Payer: Self-pay

## 2018-05-25 DIAGNOSIS — Z95820 Peripheral vascular angioplasty status with implants and grafts: Secondary | ICD-10-CM | POA: Diagnosis not present

## 2018-05-25 DIAGNOSIS — L97521 Non-pressure chronic ulcer of other part of left foot limited to breakdown of skin: Secondary | ICD-10-CM | POA: Diagnosis not present

## 2018-05-25 DIAGNOSIS — M62838 Other muscle spasm: Secondary | ICD-10-CM | POA: Diagnosis not present

## 2018-05-25 DIAGNOSIS — M549 Dorsalgia, unspecified: Secondary | ICD-10-CM | POA: Diagnosis not present

## 2018-05-25 DIAGNOSIS — L03116 Cellulitis of left lower limb: Secondary | ICD-10-CM | POA: Diagnosis not present

## 2018-05-25 DIAGNOSIS — E1169 Type 2 diabetes mellitus with other specified complication: Secondary | ICD-10-CM | POA: Diagnosis not present

## 2018-05-25 DIAGNOSIS — F341 Dysthymic disorder: Secondary | ICD-10-CM | POA: Diagnosis not present

## 2018-05-25 DIAGNOSIS — I10 Essential (primary) hypertension: Secondary | ICD-10-CM | POA: Diagnosis not present

## 2018-05-25 DIAGNOSIS — E11621 Type 2 diabetes mellitus with foot ulcer: Secondary | ICD-10-CM | POA: Diagnosis not present

## 2018-05-25 NOTE — Telephone Encounter (Signed)
Called pt to inform him that his bydureon pens were here.

## 2018-05-25 NOTE — Telephone Encounter (Signed)
Called and lmom to give [place] a call back. Will try to call pt again by the end of the day.

## 2018-05-26 DIAGNOSIS — I959 Hypotension, unspecified: Secondary | ICD-10-CM | POA: Diagnosis not present

## 2018-05-26 DIAGNOSIS — L089 Local infection of the skin and subcutaneous tissue, unspecified: Secondary | ICD-10-CM | POA: Diagnosis not present

## 2018-05-26 DIAGNOSIS — R262 Difficulty in walking, not elsewhere classified: Secondary | ICD-10-CM | POA: Diagnosis not present

## 2018-05-26 DIAGNOSIS — E11628 Type 2 diabetes mellitus with other skin complications: Secondary | ICD-10-CM | POA: Diagnosis not present

## 2018-05-26 DIAGNOSIS — M6281 Muscle weakness (generalized): Secondary | ICD-10-CM | POA: Diagnosis not present

## 2018-05-26 DIAGNOSIS — C44319 Basal cell carcinoma of skin of other parts of face: Secondary | ICD-10-CM | POA: Diagnosis not present

## 2018-05-26 DIAGNOSIS — N179 Acute kidney failure, unspecified: Secondary | ICD-10-CM | POA: Diagnosis not present

## 2018-05-26 DIAGNOSIS — G464 Cerebellar stroke syndrome: Secondary | ICD-10-CM | POA: Diagnosis not present

## 2018-05-26 DIAGNOSIS — I639 Cerebral infarction, unspecified: Secondary | ICD-10-CM | POA: Diagnosis not present

## 2018-05-28 ENCOUNTER — Telehealth: Payer: Self-pay

## 2018-05-28 ENCOUNTER — Encounter: Payer: Self-pay | Admitting: Infectious Diseases

## 2018-05-28 DIAGNOSIS — L089 Local infection of the skin and subcutaneous tissue, unspecified: Secondary | ICD-10-CM | POA: Diagnosis not present

## 2018-05-28 DIAGNOSIS — I10 Essential (primary) hypertension: Secondary | ICD-10-CM | POA: Diagnosis not present

## 2018-05-28 DIAGNOSIS — Z95820 Peripheral vascular angioplasty status with implants and grafts: Secondary | ICD-10-CM | POA: Diagnosis not present

## 2018-05-28 DIAGNOSIS — M549 Dorsalgia, unspecified: Secondary | ICD-10-CM | POA: Diagnosis not present

## 2018-05-28 DIAGNOSIS — E1169 Type 2 diabetes mellitus with other specified complication: Secondary | ICD-10-CM | POA: Diagnosis not present

## 2018-05-28 DIAGNOSIS — F341 Dysthymic disorder: Secondary | ICD-10-CM | POA: Diagnosis not present

## 2018-05-28 DIAGNOSIS — L97521 Non-pressure chronic ulcer of other part of left foot limited to breakdown of skin: Secondary | ICD-10-CM | POA: Diagnosis not present

## 2018-05-28 DIAGNOSIS — E11621 Type 2 diabetes mellitus with foot ulcer: Secondary | ICD-10-CM | POA: Diagnosis not present

## 2018-05-28 DIAGNOSIS — L03116 Cellulitis of left lower limb: Secondary | ICD-10-CM | POA: Diagnosis not present

## 2018-05-28 DIAGNOSIS — M62838 Other muscle spasm: Secondary | ICD-10-CM | POA: Diagnosis not present

## 2018-05-28 NOTE — Telephone Encounter (Signed)
KEISHA FROM [facility] CALLED JUST TO NOTIFY us THAT PT HAD A FALL WITH NO INJURY.

## 2018-05-28 NOTE — Telephone Encounter (Signed)
Ok thx.

## 2018-05-29 ENCOUNTER — Encounter (INDEPENDENT_AMBULATORY_CARE_PROVIDER_SITE_OTHER): Payer: Medicare HMO

## 2018-05-29 ENCOUNTER — Ambulatory Visit (INDEPENDENT_AMBULATORY_CARE_PROVIDER_SITE_OTHER): Payer: Medicare HMO | Admitting: Nurse Practitioner

## 2018-05-30 DIAGNOSIS — E11621 Type 2 diabetes mellitus with foot ulcer: Secondary | ICD-10-CM | POA: Diagnosis not present

## 2018-05-30 DIAGNOSIS — T8744 Infection of amputation stump, left lower extremity: Secondary | ICD-10-CM | POA: Diagnosis not present

## 2018-05-30 DIAGNOSIS — L97511 Non-pressure chronic ulcer of other part of right foot limited to breakdown of skin: Secondary | ICD-10-CM | POA: Diagnosis not present

## 2018-05-30 DIAGNOSIS — I739 Peripheral vascular disease, unspecified: Secondary | ICD-10-CM | POA: Diagnosis not present

## 2018-05-30 DIAGNOSIS — M86172 Other acute osteomyelitis, left ankle and foot: Secondary | ICD-10-CM | POA: Diagnosis not present

## 2018-05-30 DIAGNOSIS — I10 Essential (primary) hypertension: Secondary | ICD-10-CM | POA: Diagnosis not present

## 2018-05-30 DIAGNOSIS — E1169 Type 2 diabetes mellitus with other specified complication: Secondary | ICD-10-CM | POA: Diagnosis not present

## 2018-05-30 DIAGNOSIS — L03116 Cellulitis of left lower limb: Secondary | ICD-10-CM | POA: Diagnosis not present

## 2018-05-30 DIAGNOSIS — E1151 Type 2 diabetes mellitus with diabetic peripheral angiopathy without gangrene: Secondary | ICD-10-CM | POA: Diagnosis not present

## 2018-05-31 ENCOUNTER — Telehealth: Payer: Self-pay | Admitting: Licensed Clinical Social Worker

## 2018-05-31 NOTE — Telephone Encounter (Signed)
Linn from [facility] called stating that the patient reports diarrhea for 3 weeks, and has gone 4 to 5 times today. I spoke with patient and he states that he is having to throw away underwear because he can't make it fast enough and he has some stomach cramping but not much. Patient reports that when he had to hold IV antibiotics, looses stools went away. Please advise

## 2018-05-31 NOTE — Telephone Encounter (Signed)
Let us ask home nurse to check stool for cdiff and ask him to hold his IV antibiotic- He has an appt with Dr.Cline tomorrow and I will speak with Dr.Cline ( podiatrist) and decide on further management. Thx

## 2018-06-01 DIAGNOSIS — L03116 Cellulitis of left lower limb: Secondary | ICD-10-CM | POA: Diagnosis not present

## 2018-06-01 DIAGNOSIS — E11621 Type 2 diabetes mellitus with foot ulcer: Secondary | ICD-10-CM | POA: Diagnosis not present

## 2018-06-01 DIAGNOSIS — E1169 Type 2 diabetes mellitus with other specified complication: Secondary | ICD-10-CM | POA: Diagnosis not present

## 2018-06-01 DIAGNOSIS — I739 Peripheral vascular disease, unspecified: Secondary | ICD-10-CM | POA: Diagnosis not present

## 2018-06-01 DIAGNOSIS — L97511 Non-pressure chronic ulcer of other part of right foot limited to breakdown of skin: Secondary | ICD-10-CM | POA: Diagnosis not present

## 2018-06-01 DIAGNOSIS — I10 Essential (primary) hypertension: Secondary | ICD-10-CM | POA: Diagnosis not present

## 2018-06-01 DIAGNOSIS — M86172 Other acute osteomyelitis, left ankle and foot: Secondary | ICD-10-CM | POA: Diagnosis not present

## 2018-06-01 DIAGNOSIS — T8744 Infection of amputation stump, left lower extremity: Secondary | ICD-10-CM | POA: Diagnosis not present

## 2018-06-01 DIAGNOSIS — E1151 Type 2 diabetes mellitus with diabetic peripheral angiopathy without gangrene: Secondary | ICD-10-CM | POA: Diagnosis not present

## 2018-06-01 DIAGNOSIS — L02612 Cutaneous abscess of left foot: Secondary | ICD-10-CM | POA: Diagnosis not present

## 2018-06-04 ENCOUNTER — Encounter (INDEPENDENT_AMBULATORY_CARE_PROVIDER_SITE_OTHER): Payer: Self-pay | Admitting: Nurse Practitioner

## 2018-06-04 ENCOUNTER — Ambulatory Visit (INDEPENDENT_AMBULATORY_CARE_PROVIDER_SITE_OTHER): Payer: Medicare HMO | Admitting: Nurse Practitioner

## 2018-06-04 ENCOUNTER — Other Ambulatory Visit: Payer: Self-pay

## 2018-06-04 ENCOUNTER — Ambulatory Visit (INDEPENDENT_AMBULATORY_CARE_PROVIDER_SITE_OTHER): Payer: Medicare HMO

## 2018-06-04 VITALS — BP 139/81 | HR 78 | Resp 20 | Ht 68.0 in | Wt 290.0 lb

## 2018-06-04 DIAGNOSIS — L97511 Non-pressure chronic ulcer of other part of right foot limited to breakdown of skin: Secondary | ICD-10-CM | POA: Diagnosis not present

## 2018-06-04 DIAGNOSIS — L97529 Non-pressure chronic ulcer of other part of left foot with unspecified severity: Secondary | ICD-10-CM

## 2018-06-04 DIAGNOSIS — E11621 Type 2 diabetes mellitus with foot ulcer: Secondary | ICD-10-CM | POA: Diagnosis not present

## 2018-06-04 DIAGNOSIS — I10 Essential (primary) hypertension: Secondary | ICD-10-CM | POA: Diagnosis not present

## 2018-06-04 DIAGNOSIS — T8744 Infection of amputation stump, left lower extremity: Secondary | ICD-10-CM | POA: Diagnosis not present

## 2018-06-04 DIAGNOSIS — I7025 Atherosclerosis of native arteries of other extremities with ulceration: Secondary | ICD-10-CM

## 2018-06-04 DIAGNOSIS — M86172 Other acute osteomyelitis, left ankle and foot: Secondary | ICD-10-CM | POA: Diagnosis not present

## 2018-06-04 DIAGNOSIS — R600 Localized edema: Secondary | ICD-10-CM | POA: Diagnosis not present

## 2018-06-04 DIAGNOSIS — L03116 Cellulitis of left lower limb: Secondary | ICD-10-CM | POA: Diagnosis not present

## 2018-06-04 DIAGNOSIS — E1151 Type 2 diabetes mellitus with diabetic peripheral angiopathy without gangrene: Secondary | ICD-10-CM | POA: Diagnosis not present

## 2018-06-04 DIAGNOSIS — E1169 Type 2 diabetes mellitus with other specified complication: Secondary | ICD-10-CM | POA: Diagnosis not present

## 2018-06-04 DIAGNOSIS — I739 Peripheral vascular disease, unspecified: Secondary | ICD-10-CM | POA: Diagnosis not present

## 2018-06-04 NOTE — Telephone Encounter (Signed)
I  left patient a voicemail to call me if his symptoms have not subsided since stopping the medication.

## 2018-06-04 NOTE — Telephone Encounter (Signed)
Ok thank you 

## 2018-06-04 NOTE — Telephone Encounter (Signed)
done

## 2018-06-04 NOTE — Telephone Encounter (Signed)
Yes I think the nurse went out today to pull the picc, but if not Lyn said they can get it tomorrow along with the C-Diff sample

## 2018-06-04 NOTE — Telephone Encounter (Signed)
Thx, will they be removing his PICC tomorrow as well?

## 2018-06-04 NOTE — Telephone Encounter (Signed)
Spoke with patient and he stated that he is still having diarrhea, he stopped his IV ABX on 06/01/2017. Nursing will collect sample for C-Diff tomorrow.

## 2018-06-04 NOTE — Telephone Encounter (Signed)
Okay, Thanks

## 2018-06-05 DIAGNOSIS — M86172 Other acute osteomyelitis, left ankle and foot: Secondary | ICD-10-CM | POA: Diagnosis not present

## 2018-06-05 DIAGNOSIS — I739 Peripheral vascular disease, unspecified: Secondary | ICD-10-CM | POA: Diagnosis not present

## 2018-06-05 DIAGNOSIS — L03116 Cellulitis of left lower limb: Secondary | ICD-10-CM | POA: Diagnosis not present

## 2018-06-05 DIAGNOSIS — E1169 Type 2 diabetes mellitus with other specified complication: Secondary | ICD-10-CM | POA: Diagnosis not present

## 2018-06-05 DIAGNOSIS — E1151 Type 2 diabetes mellitus with diabetic peripheral angiopathy without gangrene: Secondary | ICD-10-CM | POA: Diagnosis not present

## 2018-06-05 DIAGNOSIS — I10 Essential (primary) hypertension: Secondary | ICD-10-CM | POA: Diagnosis not present

## 2018-06-05 DIAGNOSIS — L97511 Non-pressure chronic ulcer of other part of right foot limited to breakdown of skin: Secondary | ICD-10-CM | POA: Diagnosis not present

## 2018-06-05 DIAGNOSIS — R197 Diarrhea, unspecified: Secondary | ICD-10-CM | POA: Diagnosis not present

## 2018-06-05 DIAGNOSIS — E11621 Type 2 diabetes mellitus with foot ulcer: Secondary | ICD-10-CM | POA: Diagnosis not present

## 2018-06-05 DIAGNOSIS — T8744 Infection of amputation stump, left lower extremity: Secondary | ICD-10-CM | POA: Diagnosis not present

## 2018-06-06 DIAGNOSIS — E1151 Type 2 diabetes mellitus with diabetic peripheral angiopathy without gangrene: Secondary | ICD-10-CM | POA: Diagnosis not present

## 2018-06-06 DIAGNOSIS — T8744 Infection of amputation stump, left lower extremity: Secondary | ICD-10-CM | POA: Diagnosis not present

## 2018-06-06 DIAGNOSIS — I739 Peripheral vascular disease, unspecified: Secondary | ICD-10-CM | POA: Diagnosis not present

## 2018-06-06 DIAGNOSIS — E1169 Type 2 diabetes mellitus with other specified complication: Secondary | ICD-10-CM | POA: Diagnosis not present

## 2018-06-06 DIAGNOSIS — L03116 Cellulitis of left lower limb: Secondary | ICD-10-CM | POA: Diagnosis not present

## 2018-06-06 DIAGNOSIS — M86172 Other acute osteomyelitis, left ankle and foot: Secondary | ICD-10-CM | POA: Diagnosis not present

## 2018-06-06 DIAGNOSIS — E11621 Type 2 diabetes mellitus with foot ulcer: Secondary | ICD-10-CM | POA: Diagnosis not present

## 2018-06-06 DIAGNOSIS — L97511 Non-pressure chronic ulcer of other part of right foot limited to breakdown of skin: Secondary | ICD-10-CM | POA: Diagnosis not present

## 2018-06-06 DIAGNOSIS — I10 Essential (primary) hypertension: Secondary | ICD-10-CM | POA: Diagnosis not present

## 2018-06-07 ENCOUNTER — Other Ambulatory Visit: Payer: Self-pay | Admitting: *Deleted

## 2018-06-07 NOTE — Patient Outreach (Signed)
  [facility]) Care Management  06/07/2018  Victor Wise November 19, 1950 [id number]   Telephone Screen  Referral Date: 06/07/2018 Referral Source: UM referral - Estill Dooms SW navigator/ Pt care manager of [facility]  Referral Reason: Re submit of referral per notes - Pt needs continuous nursing services for monitoring med and diet compliance  Insurance: [place] RN CM reviewed Epic to find pt call attempts unsuccessful from 05/11/18 to 05/24/2018 [name] was provided [facility] services from 2017 to 2018 from [place], SW and community RN CM    Outreach attempt # 1 No answer. [facility] RN CM left HIPAA compliant voicemail message along with CM's contact info.   Plan: [facility] RN CM sent an unsuccessful outreach letter and scheduled this patient for another call attempt within 4 business days  [name] [name], RN, BSN, [place] Coordinator Office number [phone] Mobile number [phone]  Main [facility] number [phone] Fax number [phone]

## 2018-06-07 NOTE — Patient Outreach (Addendum)
[facility]) Care Management  06/07/2018  Victor Wise [birth year] [id number]  Telephone Screen  Referral Date: 06/07/2018 Referral Source: UM referral - Victor Wise ,SW navigator, was asked to re-submit a referral to [facility] by the Pt care manager of [facility] at Victor Wise request  Victor Wise states she may be contacted for any questions at [phone] ext [phone] Referral Reason: Re submit of referral per notes - Pt needs continuous nursing services for monitoring med and diet compliance  Insurance: [employer] HMO  No hospitalization in the last 30 days Last admissions in October and December of 2019   [facility] RN Wise reviewed Epic to find pt call attempts unsuccessful from 05/11/18 to 05/24/2018 Victor Wise was provided [facility] services from 2017 to 2018 from [place], SW and community RN Wise Was previously assisted with meals on wheels, diet, falls, medicines He had declined an offer for [facility] health coach services and had been agreeable to case closure In February 2018    Patient returned a call to Victor Wise Patient is able to verify HIPAA Reviewed and addressed UM referral to [facility] with patient He confirms he is "needing any assistance I can get"  He confirms he needs assistance with medications and diet  He confirms being over weight Wt presently 292 lbs BMI of 44.26  He informs [facility] RN Wise that he just returned home from [facility] for a visit to the pain management provider, Victor Wise for both feet, right knee pain and back pain -monitoring opioid (OxyContin ER 20 mg BID, oxycodone 5 mg daily prn, lyrica 200 mg TID per day) maintenance therapy He is under a  Pain contract with Victor Wise since 02/24/17 - He reports hear vs ice resolves his pain He reports his MD prefer Ice  He reports his feet hurt because the veins have been "cleared" x 2    He reports having a worker compensation injury from 1992  He reports he has already received over $260,000 but  worker compensation is still assisting with some co pays  He states he has not heard from the worker compensation lawyer in 2 weeks   He reports he has open wounds to both feet that is being packed on Mondays, Wednesdays and Fridays He reports keeping his foot elevated, wrapped and in a shoe cast. He voices relief at being informed that his MD did not have to amputate  He reports that [facility] is providing him services(RN, PT)  and DME. He is seen by an RN at home 3 times per week for dressing changes    Social: Victor Wise is divorced and disabled. He is able to complete all his care. He denies issues with transportation He gets assistance from a neighbor to go to the pharmacy He has a daughter listed who assists on occasions He also has a sister  He reports sometimes not having money to afford food He states he needs assist to apply for Medicaid He states he has applied for financial hardship related to "three" of his bills (one is [place] express) He reports previously seeing a therapist for depression and loss of interest but it "did not help" He voices not being able to afford the cost of therapy at $35-45   Conditions: Left diabetic foot ulcer with wound infection hx, Chronic radicular low back pain, Chronic pain syndrome, Asthma, concussion syndrome, CAD, MI in 2016, HDL, HTN, hx of kidney stones, obesity, pace  maker, rotator cuff tear hx, sinusitis, sleep apnea with a CPAP, hx of stroke, depression, frequent UTI's, DM type 2, Former smoker, hx of osteomyelitis, hx rt toe amputation , basal cell carcinoma of fore head hx BPH, Last A1c on 03/02/18 was 8.0 Hx of falling in a MD office related to the shoes/cast  He was wearing   Falls He reports falls 10 in the last 3 months He reports h is last fall on 06/01/2018 without injury He reports the "cuts" in his feet causes mobility issues   DME Walker, w.c, spinal stimulator , feeding tube in    Medications: Polypharmacy Navigator  referral indicates needs assist with medication monitoring  He reports having 3 medications at his pharmacy that he has not picked up because he can not afford them  He reports the worker's compensation only assists with a few of his medications He reports [employer] is frequently calling and wanting to change his medications He reports [employer] has called to change his medications to include antibiotics  Appointments: Last "Thursday or Friday" he was seen at Victor Wise, surgeon office and reports he was "still wobbly"   Advance Directives: He has a living will   Consent: Victor Wise reviewed [facility] services with patient. Patient gave verbal consent for services.   Plans: [facility] RN Wise will refer Victor Wise to [facility] SW for assistance with financial concerns, assistance/answering questions related to applying for [employer] for food, possible community resources for cost efficient counseling (individual or community group)    [facility] RN Wise will refer Victor Wise to [facility] community RN Wise for Disease management for DM, HTN, frequent falls, wounds on his feet, medications to include pain meds and antibiotic and diet compliance   [facility] RN Wise will refer Victor Wise to [place] for medication management, medication monitoring (per referral from Navigator), concerns with changes in meds per [facility] requests and cost concerns (has 3 medications he had not picked up from pharmacy).    Victor Gunn L. [name], RN, BSN, [place] Coordinator Office number [phone] Mobile number [phone]  Main [facility] number [phone] Fax number [phone]

## 2018-06-08 ENCOUNTER — Telehealth: Payer: Self-pay | Admitting: Licensed Clinical Social Worker

## 2018-06-08 ENCOUNTER — Encounter (INDEPENDENT_AMBULATORY_CARE_PROVIDER_SITE_OTHER): Payer: Self-pay | Admitting: Nurse Practitioner

## 2018-06-08 ENCOUNTER — Ambulatory Visit: Payer: Self-pay | Admitting: *Deleted

## 2018-06-08 DIAGNOSIS — E1151 Type 2 diabetes mellitus with diabetic peripheral angiopathy without gangrene: Secondary | ICD-10-CM | POA: Diagnosis not present

## 2018-06-08 DIAGNOSIS — E11621 Type 2 diabetes mellitus with foot ulcer: Secondary | ICD-10-CM | POA: Diagnosis not present

## 2018-06-08 DIAGNOSIS — L97511 Non-pressure chronic ulcer of other part of right foot limited to breakdown of skin: Secondary | ICD-10-CM | POA: Diagnosis not present

## 2018-06-08 DIAGNOSIS — M86172 Other acute osteomyelitis, left ankle and foot: Secondary | ICD-10-CM | POA: Diagnosis not present

## 2018-06-08 DIAGNOSIS — I739 Peripheral vascular disease, unspecified: Secondary | ICD-10-CM | POA: Diagnosis not present

## 2018-06-08 DIAGNOSIS — I10 Essential (primary) hypertension: Secondary | ICD-10-CM | POA: Diagnosis not present

## 2018-06-08 DIAGNOSIS — T8744 Infection of amputation stump, left lower extremity: Secondary | ICD-10-CM | POA: Diagnosis not present

## 2018-06-08 DIAGNOSIS — L03116 Cellulitis of left lower limb: Secondary | ICD-10-CM | POA: Diagnosis not present

## 2018-06-08 DIAGNOSIS — E1169 Type 2 diabetes mellitus with other specified complication: Secondary | ICD-10-CM | POA: Diagnosis not present

## 2018-06-08 NOTE — Telephone Encounter (Signed)
I left a message for the patient saying that I am unable to prescribe anything as he is already on imodium- asked him to modify his diet with high fiber- call his PCP's office -on call to discuss medication. Told him cdiff was negative

## 2018-06-08 NOTE — Telephone Encounter (Signed)
Patient was upset after I asked him to call his primary, he states he has been taking imodium the entire time he has had loose stools. I called Dr. Laurelyn Sickle office but they had already closed for the day. Patient wants something stronger called in to help stop his loose stools.

## 2018-06-08 NOTE — Progress Notes (Signed)
Subjective:    Patient ID: Victor Wise, male    DOB: [birth year], 68 y.o.   MRN: [id number] Chief Complaint  Patient presents with  . Follow-up    3 wk unna wrap check    HPI  Victor Wise is a 68 y.o. male that presents today for wound check regarding his left leg swelling.  His swelling is greatly improved, and the hard area that was behind his left knee has resolved.  It was previously felt that his pain, discomfort, and swelling was likely due to a combination of ruptured Baker's cyst as well as reperfusion syndrome from previous left lower extremity angiogram.  Today the patient reports that the pain is essentially resolved today.  He also reports healing of his left lower extremity toe amputation.  He reports is getting better although it is still deep.  He denies any claudication-like symptoms at this time.  He denies any chest pain or shortness of breath.  He denies any fever, chills, nausea, vomiting or diarrhea.  The patient underwent bilateral ABIs today which revealed an ABI of 0.92 on his right and 1.09 on his left.  Previous ABIs done on 03/29/2018 reveal right ABI 0.96 and left at 1.30.  He has biphasic tibial artery waveforms bilaterally.  Past Medical History:  Diagnosis Date  . Allergy   . Basal cell carcinoma    forehead  . BPH (benign prostatic hyperplasia)   . Chronic back pain   . Depression   . Diabetes ([place])   . GERD (gastroesophageal reflux disease)   . Heart attack ([place])   . Hypertension   . Morbid obesity ([place])   . Obstructive sleep apnea   . 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 Status post insertion of spinal cord stimulator   . Stroke ([place])   . UTI (lower urinary tract infection)     Past Surgical History:  Procedure Laterality Date  . AMPUTATION TOE Left 04/19/2018   Procedure: AMPUTATION TOE-LEFT GREAT TOE;  Surgeon: Sharlotte Alamo, DPM;  Location: [facility];  Service: Podiatry;  Laterality: Left;  . CARDIAC CATHETERIZATION N/A 12/19/2014   Procedure:  Coronary Stent Intervention;  Surgeon: Charolette Forward, MD;  Location: [place] [facility];  Service: Cardiovascular;  Laterality: N/A;  . CARDIAC CATHETERIZATION Left 12/19/2014   Procedure: Left Heart Cath and Coronary Angiography;  Surgeon: Dionisio Isao, MD;  Location: [place] [facility];  Service: Cardiovascular;  Laterality: Left;  . IRRIGATION AND DEBRIDEMENT FOOT Left 03/02/2018   Procedure: IRRIGATION AND DEBRIDEMENT FOOT;  Surgeon: Samara Deist, DPM;  Location: [facility];  Service: Podiatry;  Laterality: Left;  . IRRIGATION AND DEBRIDEMENT FOOT Left 03/08/2018   Procedure: IRRIGATION AND DEBRIDEMENT FOOT AND BONE;  Surgeon: Sharlotte Alamo, DPM;  Location: [facility];  Service: Podiatry;  Laterality: Left;  . IRRIGATION AND DEBRIDEMENT FOOT Left 04/19/2018   Procedure: IRRIGATION AND DEBRIDEMENT FOOT;  Surgeon: Sharlotte Alamo, DPM;  Location: [facility];  Service: Podiatry;  Laterality: Left;  . LOWER EXTREMITY ANGIOGRAPHY Left 03/05/2018   Procedure: Lower Extremity Angiography;  Surgeon: Algernon Huxley, MD;  Location: [place] [facility];  Service: Cardiovascular;  Laterality: Left;  . LOWER EXTREMITY ANGIOGRAPHY Left 03/08/2018   Procedure: LOWER EXTREMITY ANGIOGRAPHY;  Surgeon: Algernon Huxley, MD;  Location: [place] [facility];  Service: Cardiovascular;  Laterality: Left;  . PACEMAKER INSERTION Left 11/02/2015   Procedure: INSERTION PACEMAKER;  Surgeon: Isaias Cowman, MD;  Location: [facility];  Service: Cardiovascular;  Laterality: Left;  . Pain Stimulator    . Right toe amputation      Social History   Socioeconomic History  . Marital status: Divorced    Spouse name: Not on file  . Number of children: Not on file  . Years of education: Not on file  . Highest education level: Not on file  Occupational History  . Not on file  Social Needs  . Financial resource strain: Not on file  . Food insecurity:    Worry: Not on file    Inability: Not on file  . Transportation needs:     Medical: Not on file    Non-medical: Not on file  Tobacco Use  . Smoking status: Former Smoker    Types: Cigarettes  . Smokeless tobacco: Never Used  . Tobacco comment: quit 45 years  Substance and Sexual Activity  . Alcohol use: Not Currently    Alcohol/week: 0.0 standard drinks    Comment: have not had alcohol in 14yrs  . Drug use: No  . Sexual activity: Not on file  Lifestyle  . Physical activity:    Days per week: Not on file    Minutes per session: Not on file  . Stress: Not on file  Relationships  . Social connections:    Talks on phone: Not on file    Gets together: Not on file    Attends religious service: Not on file    Active member of club or organization: Not on file    Attends meetings of clubs or organizations: Not on file    Relationship status: Not on file  . Intimate partner violence:    Fear of current or ex partner: Not on file    Emotionally abused: Not on file    Physically abused: Not on file    Forced sexual activity: Not on file  Other Topics Concern  . Not on file  Social History Narrative  . Not on file    Family History  Problem Relation Age of Onset  . Breast cancer Mother   . Cancer Mother   . Hypertension Mother   . Lung cancer Father   . Hypertension Father   . Heart disease Father   . Cancer Father   . Kidney disease Sister   . Prostate cancer Neg Hx     Allergies  Allergen Reactions  . Amoxicillin Other (See Comments)    Has never taken amoxicillin -ENT told him not to take (? Had skin test) Has patient had a PCN reaction causing immediate rash, facial/tongue/throat swelling, SOB or lightheadedness with hypotension: No Has patient had a PCN reaction causing severe rash involving mucus membranes or skin necrosis: No Has patient had a PCN reaction that required hospitalization No Has patient had a PCN reaction occurring within the last 10 years: No If above answers are "NO", then may proceed w/ Cephalo  . Other Anaphylaxis,  Itching and Other (See Comments)    Pt states that he is allergic to Endopa.  Reaction:  Anaphylaxis  Pt states that he is allergic to Metabisulfites Reaction:  Itching   . Penicillins Other (See Comments)    Arm turned "blue" at injection site (no treatment needed) Has patient had a PCN reaction causing immediate rash, facial/tongue/throat swelling, SOB or lightheadedness with hypotension: No Has patient had a PCN reaction causing severe rash involving mucus membranes or skin necrosis: No Has patient had a PCN reaction that required hospitalization No Has patient had a PCN  reaction occurring within the last 10 years: No If all of the above answers are "NO", then may proceed with Cephalosporin use.  [name] Hydralazine Other (See Comments)    Reaction:  Cramping of extremities Pt reports tolerating low dose 12.5mg  but no higher.  . Cephalexin Itching  . Clindamycin Itching  . Crestor [Rosuvastatin Calcium] Other (See Comments)    Reaction:  Pt is unable to move arms/legs   . Metoprolol Nausea And Vomiting  . Red Dye Itching  . Statins Other (See Comments)    Reaction:  Pt is unable to move arms/legs  . Sulfa Antibiotics Itching    Other reaction(s): Unknown  . Yellow Dyes (Non-Tartrazine) Itching  . Aspirin Itching and Other (See Comments)    Pt states that he is able to use in lower doses.    . Tape Rash    Please use "paper" tape only. Please use "paper" tape only.     Review of Systems   Review of Systems: Negative Unless Checked Constitutional: [] Weight loss  [] Fever  [] Chills Cardiac: [] Chest pain   []  Atrial Fibrillation  [] Palpitations   [] Shortness of breath when laying flat   [] Shortness of breath with exertion. [] Shortness of breath at rest Vascular:  [] Pain in legs with walking   [] Pain in legs with standing [] Pain in legs when laying flat   [] Claudication    [] Pain in feet when laying flat    [] History of DVT   [] Phlebitis   [] Swelling in legs   [] Varicose veins    [] Non-healing ulcers Pulmonary:   [] Uses home oxygen   [] Productive cough   [] Hemoptysis   [] Wheeze  [] COPD   [] Asthma Neurologic:  [] Dizziness   [] Seizures  [] Blackouts [x] History of stroke   [] History of TIA  [] Aphasia   [] Temporary Blindness   [] Weakness or numbness in arm   [] Weakness or numbness in leg Musculoskeletal:   [] Joint swelling   [x] Joint pain   [x] Low back pain  []  History of Knee Replacement [x] Arthritis [] back Surgeries  []  Spinal Stenosis    Hematologic:  [] Easy bruising  [] Easy bleeding   [] Hypercoagulable state   [] Anemic Gastrointestinal:  [] Diarrhea   [] Vomiting  [] Gastroesophageal reflux/heartburn   [] Difficulty swallowing. [] Abdominal pain Genitourinary:  [] Chronic kidney disease   [] Difficult urination  [] Anuric   [] Blood in urine [] Frequent urination  [] Burning with urination   [] Hematuria Skin:  [] Rashes   [] Ulcers [] Wounds Psychological:  [x] History of anxiety   [x]  History of major depression  []  Memory Difficulties     Objective:   Physical Exam  BP 139/81 (BP Location: Left Arm)   Pulse 78   Resp 20   Ht 5\' 8"  (1.727 m)   Wt 290 lb (131.5 kg)   BMI 44.09 kg/m   Gen: WD/WN, NAD Head: [place]/AT, No temporalis wasting.  Ear/Nose/Throat: Hearing grossly intact, nares w/o erythema or drainage Eyes: PER, EOMI, sclera nonicteric.  Neck: Supple, no masses.  No JVD.  Pulmonary:  Good air movement, no use of accessory muscles.  Cardiac: RRR Vascular:  1+ soft edema bilaterally.  Previous hard area behind calf is resolved Vessel Right Left  Radial Palpable Palpable  Dorsalis Pedis  not palpable  trace palpable  Posterior Tibial Palpable  trace palpable   Gastrointestinal: soft, non-distended. No guarding/no peritoneal signs.  Musculoskeletal: M/S 5/5 throughout.    Left great toe amputation, right great toe amputation Neurologic: Pain and light touch intact in extremities.  Symmetrical.  Speech is fluent. Motor exam as listed  above. Psychiatric: Judgment intact,  Mood & affect appropriate for pt's clinical situation. Dermatologic: No Venous rashes. No Ulcers Noted.  No changes consistent with cellulitis. Lymph : No Cervical lymphadenopathy, no lichenification or skin changes of chronic lymphedema.      Assessment & Plan:   1. Diabetic ulcer of toe of left foot associated with type 2 diabetes mellitus, unspecified ulcer stage [facility]) Patient reports that the wound is healing much better although it is still deep.  Patient will continue to follow with podiatry for wound.  2. Atherosclerosis of native arteries of the extremities with ulceration ([place]) The patient underwent bilateral ABIs today which revealed an ABI of 0.92 on his right and 1.09 on his left.  Previous ABIs done on 03/29/2018 reveal right ABI 0.96 and left at 1.30.  He has biphasic tibial artery waveforms bilaterally.  Patient most recent lower extremity angiogram was on 03/05/2018.  Today the patient's ABIs and waveforms appear stable.  We will have the patient follow-up within 3 months with noninvasive studies to assess the patient's vascular status.  He will follow-up sooner if his wound healing is delayed or if new ulceration should occur.  Patient is in agreement with the plan. - VAS [place] ABI WITH/WO TBI; Future  3. Edema of lower extremity Edema of the left lower extremity is much more controlled today.  We will not repeat Unna wrap therapy.  The patient still has some edema in his foot, however this is likely due to the inflammatory process of the infection and healing process of his diabetic ulcer.  Patient was instructed to elevate as much as possible.  As well as to apply compression wraps if he is able.  Patient is instructed to contact us should he find that his edema returns and is unable to be controlled.  Patient understands    Current Outpatient Medications on File Prior to Visit  Medication Sig Dispense Refill  . acetaminophen (TYLENOL) 325 MG tablet Take 2 tablets (650 mg  total) by mouth every 6 (six) hours as needed for mild pain (or Fever >/= 101).    [name] amLODipine (NORVASC) 10 MG tablet Take 1 tablet (10 mg total) by mouth daily. 30 tablet 3  . aspirin EC 81 MG tablet Take 81 mg by mouth daily.    . benazepril (LOTENSIN) 20 MG tablet Take 1 tablet (20 mg total) by mouth 2 (two) times daily. 60 tablet 2  . clopidogrel (PLAVIX) 75 MG tablet Take 1 tablet (75 mg total) by mouth daily. 30 tablet 11  . DULoxetine (CYMBALTA) 60 MG capsule Take 1 capsule (60 mg total) by mouth daily. 90 capsule 0  . fenofibrate 54 MG tablet TAKE 1 TABLET BY MOUTH A DAY AT SUPPERTIME 30 tablet 5  . fluticasone (FLONASE) 50 MCG/ACT nasal spray Place 2 sprays into both nostrils daily.    . hydrochlorothiazide (HYDRODIURIL) 12.5 MG tablet Take 1 tablet (12.5 mg total) by mouth daily. 30 tablet 5  . insulin glargine (LANTUS) 100 UNIT/ML injection Inject 0.7 mLs (70 Units total) into the skin daily. 10 mL 11  . Insulin Pen Needle (B-D UF III MINI PEN NEEDLES) 31G X 5 MM MISC Use as directed with insulin e11.65 100 each 3  . insulin regular (NOVOLIN R) 100 units/mL injection Patient to use Novolin R [place] TiD prior to meals per sliding scale instructions. Max daily dose is 45 units per day. 10 mL 11  . INSULIN SYRINGE 1CC/29G 29G X 1/2" 1 ML MISC  Insulin injections QID, use with lantus and regular insulin. DX E11.65 120 each 5  . Insulin Syringe-Needle U-100 (BD INSULIN SYRINGE U/F 1/2UNIT) 31G X 5/16" 0.3 ML MISC Indulin injection QID. Use with lantus and regular insulin Dx. 11.65 400 each 3  . NARCAN 4 MG/0.1ML LIQD Take 4 mg by mouth as needed (for opioid overdose).     . nitroGLYCERIN (NITROSTAT) 0.4 MG SL tablet Place 1 tablet (0.4 mg total) under the tongue every 5 (five) minutes x 3 doses as needed for chest pain. 25 tablet 1  . omega-3 acid ethyl esters (LOVAZA) 1 g capsule Take 1 capsule (1 g total) by mouth 2 (two) times daily. 60 capsule 5  . Oxcarbazepine (TRILEPTAL) 300 MG tablet Take  300-600 mg by mouth See admin instructions. Take 1 tablet (300MG ) by mouth every morning and 2 tablets (600MG ) by mouth every night    . oxyCODONE (OXY IR/ROXICODONE) 5 MG immediate release tablet Take 1 tablet (5 mg total) by mouth every 4 (four) hours as needed for moderate pain. 15 tablet 0  . oxyCODONE (OXYCONTIN) 10 mg 12 hr tablet Take 1 tablet (10 mg total) by mouth every 12 (twelve) hours. 20 tablet 0  . pantoprazole (PROTONIX) 40 MG tablet TAKE 1 TABLET BY MOUTH A DAY FOR REFLUX 30 tablet 3  . polyethylene glycol (MIRALAX / GLYCOLAX) packet Take 17 g by mouth daily as needed.    . pregabalin (LYRICA) 200 MG capsule Take 200 mg by mouth See admin instructions. Take 2 capsules (400MG ) by mouth every morning and 1 capsule (200MG ) by mouth every night    . tamsulosin (FLOMAX) 0.4 MG CAPS capsule Take 1 capsule (0.4 mg total) by mouth daily after supper. 30 capsule 3  . Exenatide ER (BYDUREON) 2 MG PEN Inject 2 mg into the skin once a week. (Patient not taking: Reported on 06/04/2018) 4 each 5  . fluconazole (DIFLUCAN) 200 MG tablet Take 2 tablets (400 mg total) by mouth daily. (Patient not taking: Reported on 06/04/2018) 14 tablet 0  . ibuprofen (ADVIL,MOTRIN) 400 MG tablet Take 400 mg by mouth every 6 (six) hours as needed (1-2 tablets prn).    . methocarbamol (ROBAXIN) 500 MG tablet Take 1 tablet (500 mg total) by mouth every 8 (eight) hours as needed for muscle spasms. (Patient not taking: Reported on 06/04/2018) 90 tablet 0  . senna (SENOKOT) 8.6 MG TABS tablet Take 1 tablet (8.6 mg total) by mouth 2 (two) times daily. (Patient not taking: Reported on 06/04/2018) 120 each 0   No current facility-administered medications on file prior to visit.     There are no Patient Instructions on file for this visit. No follow-ups on file.   Kris Hartmann, NP  This note was completed with Sales executive.  Any errors are purely unintentional.

## 2018-06-08 NOTE — Telephone Encounter (Signed)
I called the patient to let him know that his C-Diff was negative. Patient states that he is still having loose stools, 4 to 5 yesterday and 1 in the middle of the night. He denies cramping, nausea or vomiting. Please advise

## 2018-06-08 NOTE — Telephone Encounter (Signed)
Hopefully it will get better off antibiotics- ask him to add fiber to his diet . Can call his PCP to get imodium if needed

## 2018-06-11 DIAGNOSIS — E1151 Type 2 diabetes mellitus with diabetic peripheral angiopathy without gangrene: Secondary | ICD-10-CM | POA: Diagnosis not present

## 2018-06-11 DIAGNOSIS — I10 Essential (primary) hypertension: Secondary | ICD-10-CM | POA: Diagnosis not present

## 2018-06-11 DIAGNOSIS — L97511 Non-pressure chronic ulcer of other part of right foot limited to breakdown of skin: Secondary | ICD-10-CM | POA: Diagnosis not present

## 2018-06-11 DIAGNOSIS — M86172 Other acute osteomyelitis, left ankle and foot: Secondary | ICD-10-CM | POA: Diagnosis not present

## 2018-06-11 DIAGNOSIS — T8744 Infection of amputation stump, left lower extremity: Secondary | ICD-10-CM | POA: Diagnosis not present

## 2018-06-11 DIAGNOSIS — L03116 Cellulitis of left lower limb: Secondary | ICD-10-CM | POA: Diagnosis not present

## 2018-06-11 DIAGNOSIS — I739 Peripheral vascular disease, unspecified: Secondary | ICD-10-CM | POA: Diagnosis not present

## 2018-06-11 DIAGNOSIS — E1169 Type 2 diabetes mellitus with other specified complication: Secondary | ICD-10-CM | POA: Diagnosis not present

## 2018-06-11 DIAGNOSIS — E11621 Type 2 diabetes mellitus with foot ulcer: Secondary | ICD-10-CM | POA: Diagnosis not present

## 2018-06-12 ENCOUNTER — Other Ambulatory Visit: Payer: Self-pay | Admitting: Student-PharmD

## 2018-06-12 NOTE — Patient Outreach (Addendum)
[place] [facility]) Care Management  [place]  06/12/2018  LAURIE LOVEJOY 1950-10-18 [id number]  Reason for call: Referral for medication management and medication assistance from [place]  PMH: chronic low back pain/pain syndrome, asthma, BPH, obesity (BMI ~44), sleep apnea with a CPAP, depression/anxiety, CAD, HLD, HTN, hx of MI in 8/16 (2 stents placed), pace maker, hx of stoke in 6/17, DM type 2, and compliacted history of diabetic foot ulcers/open wounds on both feet with hospitalization 10/'19 with PAD s/p stents, hospitalization 12/19 with cellulitis and osteomyelitis of left forefoot-nonhealing diabetic foot ulcer, s/p left first toe amputation, IV abx.  Per notes, patient has aide MWF who assists with wound care.   Unsuccessful telephone call attempt #1 to patient.   HIPAA compliant voicemail left requesting a return call  Plan:  I will make another outreach attempt to patient within 3-4 business days  [employer] PharmD Candidate, Class of 2020 [place], PharmD, [place] [phone]

## 2018-06-13 DIAGNOSIS — E1169 Type 2 diabetes mellitus with other specified complication: Secondary | ICD-10-CM | POA: Diagnosis not present

## 2018-06-13 DIAGNOSIS — L03116 Cellulitis of left lower limb: Secondary | ICD-10-CM | POA: Diagnosis not present

## 2018-06-13 DIAGNOSIS — E1151 Type 2 diabetes mellitus with diabetic peripheral angiopathy without gangrene: Secondary | ICD-10-CM | POA: Diagnosis not present

## 2018-06-13 DIAGNOSIS — M86172 Other acute osteomyelitis, left ankle and foot: Secondary | ICD-10-CM | POA: Diagnosis not present

## 2018-06-13 DIAGNOSIS — T8744 Infection of amputation stump, left lower extremity: Secondary | ICD-10-CM | POA: Diagnosis not present

## 2018-06-13 DIAGNOSIS — E11621 Type 2 diabetes mellitus with foot ulcer: Secondary | ICD-10-CM | POA: Diagnosis not present

## 2018-06-13 DIAGNOSIS — I739 Peripheral vascular disease, unspecified: Secondary | ICD-10-CM | POA: Diagnosis not present

## 2018-06-13 DIAGNOSIS — I10 Essential (primary) hypertension: Secondary | ICD-10-CM | POA: Diagnosis not present

## 2018-06-13 DIAGNOSIS — L97511 Non-pressure chronic ulcer of other part of right foot limited to breakdown of skin: Secondary | ICD-10-CM | POA: Diagnosis not present

## 2018-06-14 ENCOUNTER — Other Ambulatory Visit: Payer: Self-pay

## 2018-06-14 ENCOUNTER — Other Ambulatory Visit: Payer: Self-pay | Admitting: Pharmacist

## 2018-06-14 NOTE — Patient Outreach (Signed)
[place] [facility]) Care Management  06/14/2018  Victor Wise 06-22-1950 [id number]  BSW attempted to contact the patient on today's date to conduct a community resource consult. Unfortunately, today's call was unsuccessful. BSW left the patient a HIPAA compliant voice message requesting a return call.   Plan: BSW will mail the patient an unsuccessful outreach letter. BSW will attempt the patient again within the next four business days.  Daneen Schick, BSW, CDP [facility] [phone]

## 2018-06-14 NOTE — Patient Outreach (Signed)
Telephone screening:  New referral:  Placed call to patient with no answer. Left a message requesting a call back.  PLAN: Will mail unsuccessful outreach letter and attempt again in 3 business days.  Tomasa Rand, RN, BSN, CEN [facility] [employer] [phone]

## 2018-06-14 NOTE — Patient Outreach (Addendum)
[place] [facility]) Care Management  [name]  06/14/2018  Victor Wise 09-12-1950 [id number]  Reason for call: Referral for medication management and medication assistance from [place]  Unsuccessful telephone call attempt #2 to patient.   HIPAA compliant voicemail left requesting a return call  Plan:  I will make another outreach attempt to patient within 3-4 business days  [employer] PharmD Candidate, Class of 2020 [place], PharmD, [place] [phone]

## 2018-06-15 ENCOUNTER — Other Ambulatory Visit: Payer: Self-pay

## 2018-06-15 DIAGNOSIS — M86172 Other acute osteomyelitis, left ankle and foot: Secondary | ICD-10-CM | POA: Diagnosis not present

## 2018-06-15 DIAGNOSIS — L03116 Cellulitis of left lower limb: Secondary | ICD-10-CM | POA: Diagnosis not present

## 2018-06-15 DIAGNOSIS — E1169 Type 2 diabetes mellitus with other specified complication: Secondary | ICD-10-CM | POA: Diagnosis not present

## 2018-06-15 DIAGNOSIS — L97511 Non-pressure chronic ulcer of other part of right foot limited to breakdown of skin: Secondary | ICD-10-CM | POA: Diagnosis not present

## 2018-06-15 DIAGNOSIS — E11621 Type 2 diabetes mellitus with foot ulcer: Secondary | ICD-10-CM | POA: Diagnosis not present

## 2018-06-15 DIAGNOSIS — I10 Essential (primary) hypertension: Secondary | ICD-10-CM | POA: Diagnosis not present

## 2018-06-15 DIAGNOSIS — E1151 Type 2 diabetes mellitus with diabetic peripheral angiopathy without gangrene: Secondary | ICD-10-CM | POA: Diagnosis not present

## 2018-06-15 DIAGNOSIS — I739 Peripheral vascular disease, unspecified: Secondary | ICD-10-CM | POA: Diagnosis not present

## 2018-06-15 DIAGNOSIS — T8744 Infection of amputation stump, left lower extremity: Secondary | ICD-10-CM | POA: Diagnosis not present

## 2018-06-15 NOTE — Patient Outreach (Signed)
[place]) Care Management  06/15/2018  Victor Wise Aug 16, 1950 [id number]  BSW received voice message from patient who was returning call from previous unsuccessful outreach. Unfortunately, return call to the patient was unsuccessful. BSW left a HIPAA compliant voice message requesting a return call. BSW will follow up with outreach as previously planned.  Daneen Schick, BSW, CDP [facility] [phone]

## 2018-06-15 NOTE — Patient Outreach (Signed)
New referral for assigned case manager, Landis Martins.  Incoming call back from patient who reports that he is doing well. Reports he is active with [facility] for dressing changes long term per patient.  Reports wounds healing.  Patient report CBG of 108. Reports recent elevation in A1c due to infection.  Reports taking medications as prescribed and able to manage his DM well after many years with diagnosis.  Patient reports his problems include " was in the doughnut hole and is waiting to find out if he can afford meds" Reports financial difficulty with bills.  Patient reports he has had Landis Martins be his case manager in the past and does not feel he needs a case manager right now. Reports he has a nurse 3 times per week.  Request help from pharmacy and social worker. I reviewed with patient the importance of calling team member back. He states he will.  PLAN: close to nursing. No needs identified.  Will communicate with other member of the [facility] team.  Tomasa Rand, RN, BSN, [place] Coordinator [phone]

## 2018-06-18 ENCOUNTER — Ambulatory Visit: Payer: Self-pay

## 2018-06-18 ENCOUNTER — Other Ambulatory Visit: Payer: Self-pay

## 2018-06-18 DIAGNOSIS — T8744 Infection of amputation stump, left lower extremity: Secondary | ICD-10-CM | POA: Diagnosis not present

## 2018-06-18 DIAGNOSIS — I10 Essential (primary) hypertension: Secondary | ICD-10-CM | POA: Diagnosis not present

## 2018-06-18 DIAGNOSIS — L03116 Cellulitis of left lower limb: Secondary | ICD-10-CM | POA: Diagnosis not present

## 2018-06-18 DIAGNOSIS — M86172 Other acute osteomyelitis, left ankle and foot: Secondary | ICD-10-CM | POA: Diagnosis not present

## 2018-06-18 DIAGNOSIS — E1151 Type 2 diabetes mellitus with diabetic peripheral angiopathy without gangrene: Secondary | ICD-10-CM | POA: Diagnosis not present

## 2018-06-18 DIAGNOSIS — L97511 Non-pressure chronic ulcer of other part of right foot limited to breakdown of skin: Secondary | ICD-10-CM | POA: Diagnosis not present

## 2018-06-18 DIAGNOSIS — E11621 Type 2 diabetes mellitus with foot ulcer: Secondary | ICD-10-CM | POA: Diagnosis not present

## 2018-06-18 DIAGNOSIS — I739 Peripheral vascular disease, unspecified: Secondary | ICD-10-CM | POA: Diagnosis not present

## 2018-06-18 DIAGNOSIS — E1169 Type 2 diabetes mellitus with other specified complication: Secondary | ICD-10-CM | POA: Diagnosis not present

## 2018-06-18 NOTE — Patient Outreach (Addendum)
[place] [facility]) Care Management  06/18/2018  Victor Wise 11/27/1950 [id number]  Unsuccessful outreach to the patient to assist with community resource needs. The patients phone rang several times prior to the call ending. BSW was unable to leave a voice message. BSW will attempt a final outreach to the patient within the next four business days.  Daneen Schick, BSW, CDP [place] Management Social Worker [phone]  ADDENDUM:  Incoming call from the patient, HIPAA identifiers confirmed. BSW introduced self to the patient and the reason for outreach calls. The patient indicates he is in need of assistance with the cost of medications. BSW reviewed chart and noted unsuccessful call attempt placed to the patient by [facility] pharmacist [facility]. BSW encouraged the patient to return this team members call and offered to provide contact number. Patient declined offer of number stating "I have her number".  The patient reports he is in need of transportation resources. The patient has a friend who "can take me about anytime but I have to give him advance notice". BSW explained that most resources need notice in order to make arrangements. The patient is aware of the [facility] transportation benefit and reports he has accessed this in the past. Unfortunately, the patient did not have a positive experience and is not interested in trying again. The patient is active with [employer] transportation but does not like needing to arrange trips at least 24 hours in advance. The patient further states his daughter has his car and is not as available as he would like to provide transportation assistance. BSW explained to the patient that it sounded like he was established with all community resources to assist with transportation. The patient stated understanding.  The patient confirms he still currently receives meals on wheels and denies the need for other food resources. The patient  reports he is to have a home visit this Wednesday to "re-qualify" for the meals on wheels program. The patient is aware he is over the income threshold for Medicaid but reports he plans to visit DSS "soon" with medical bills in hand to see if he can qualify for services. BSW to perform a discipline closure as no other social work needs have been identified during today's call.  Daneen Schick, BSW, CDP [facility] [phone]

## 2018-06-19 ENCOUNTER — Other Ambulatory Visit: Payer: Self-pay | Admitting: Pharmacist

## 2018-06-19 ENCOUNTER — Ambulatory Visit: Payer: Self-pay

## 2018-06-19 ENCOUNTER — Ambulatory Visit: Payer: Self-pay | Admitting: Pharmacist

## 2018-06-19 NOTE — Patient Outreach (Signed)
[place] [facility]) Care Management  [place]  06/19/2018  KOREN SERMERSHEIM 11/21/1950 [id number]   Reason for call: Medication management, medication assistance  Unsuccessful telephone call attempt #3 to patient.   HIPAA compliant voicemail left requesting a return call  Plan:  I will follow-up on 10th business day from first unsuccessful outreach attempt to maintain contact with patient. If no response from patient at this time, I will close [facility] case.   Ralene Bathe, PharmD, [place] [phone]

## 2018-06-20 DIAGNOSIS — I10 Essential (primary) hypertension: Secondary | ICD-10-CM | POA: Diagnosis not present

## 2018-06-20 DIAGNOSIS — I739 Peripheral vascular disease, unspecified: Secondary | ICD-10-CM | POA: Diagnosis not present

## 2018-06-20 DIAGNOSIS — E1169 Type 2 diabetes mellitus with other specified complication: Secondary | ICD-10-CM | POA: Diagnosis not present

## 2018-06-20 DIAGNOSIS — E1151 Type 2 diabetes mellitus with diabetic peripheral angiopathy without gangrene: Secondary | ICD-10-CM | POA: Diagnosis not present

## 2018-06-20 DIAGNOSIS — M86172 Other acute osteomyelitis, left ankle and foot: Secondary | ICD-10-CM | POA: Diagnosis not present

## 2018-06-20 DIAGNOSIS — T8744 Infection of amputation stump, left lower extremity: Secondary | ICD-10-CM | POA: Diagnosis not present

## 2018-06-20 DIAGNOSIS — L97511 Non-pressure chronic ulcer of other part of right foot limited to breakdown of skin: Secondary | ICD-10-CM | POA: Diagnosis not present

## 2018-06-20 DIAGNOSIS — E11621 Type 2 diabetes mellitus with foot ulcer: Secondary | ICD-10-CM | POA: Diagnosis not present

## 2018-06-20 DIAGNOSIS — L03116 Cellulitis of left lower limb: Secondary | ICD-10-CM | POA: Diagnosis not present

## 2018-06-22 ENCOUNTER — Ambulatory Visit: Payer: Self-pay

## 2018-06-22 ENCOUNTER — Other Ambulatory Visit: Payer: Self-pay

## 2018-06-22 DIAGNOSIS — E1151 Type 2 diabetes mellitus with diabetic peripheral angiopathy without gangrene: Secondary | ICD-10-CM | POA: Diagnosis not present

## 2018-06-22 DIAGNOSIS — E11621 Type 2 diabetes mellitus with foot ulcer: Secondary | ICD-10-CM | POA: Diagnosis not present

## 2018-06-22 DIAGNOSIS — E1169 Type 2 diabetes mellitus with other specified complication: Secondary | ICD-10-CM | POA: Diagnosis not present

## 2018-06-22 DIAGNOSIS — I10 Essential (primary) hypertension: Secondary | ICD-10-CM | POA: Diagnosis not present

## 2018-06-22 DIAGNOSIS — I739 Peripheral vascular disease, unspecified: Secondary | ICD-10-CM | POA: Diagnosis not present

## 2018-06-22 DIAGNOSIS — L03116 Cellulitis of left lower limb: Secondary | ICD-10-CM | POA: Diagnosis not present

## 2018-06-22 DIAGNOSIS — T8744 Infection of amputation stump, left lower extremity: Secondary | ICD-10-CM | POA: Diagnosis not present

## 2018-06-22 DIAGNOSIS — M86172 Other acute osteomyelitis, left ankle and foot: Secondary | ICD-10-CM | POA: Diagnosis not present

## 2018-06-22 DIAGNOSIS — E1165 Type 2 diabetes mellitus with hyperglycemia: Secondary | ICD-10-CM

## 2018-06-22 DIAGNOSIS — L97511 Non-pressure chronic ulcer of other part of right foot limited to breakdown of skin: Secondary | ICD-10-CM | POA: Diagnosis not present

## 2018-06-22 MED ORDER — INSULIN REGULAR HUMAN 100 UNIT/ML IJ SOLN: INTRAMUSCULAR | 11 refills | Status: DC

## 2018-06-25 ENCOUNTER — Other Ambulatory Visit: Payer: Self-pay | Admitting: Pharmacist

## 2018-06-25 DIAGNOSIS — I10 Essential (primary) hypertension: Secondary | ICD-10-CM | POA: Diagnosis not present

## 2018-06-25 DIAGNOSIS — T8744 Infection of amputation stump, left lower extremity: Secondary | ICD-10-CM | POA: Diagnosis not present

## 2018-06-25 DIAGNOSIS — E11621 Type 2 diabetes mellitus with foot ulcer: Secondary | ICD-10-CM | POA: Diagnosis not present

## 2018-06-25 DIAGNOSIS — I739 Peripheral vascular disease, unspecified: Secondary | ICD-10-CM | POA: Diagnosis not present

## 2018-06-25 DIAGNOSIS — M86172 Other acute osteomyelitis, left ankle and foot: Secondary | ICD-10-CM | POA: Diagnosis not present

## 2018-06-25 DIAGNOSIS — L03116 Cellulitis of left lower limb: Secondary | ICD-10-CM | POA: Diagnosis not present

## 2018-06-25 DIAGNOSIS — E1169 Type 2 diabetes mellitus with other specified complication: Secondary | ICD-10-CM | POA: Diagnosis not present

## 2018-06-25 DIAGNOSIS — E1151 Type 2 diabetes mellitus with diabetic peripheral angiopathy without gangrene: Secondary | ICD-10-CM | POA: Diagnosis not present

## 2018-06-25 DIAGNOSIS — L97511 Non-pressure chronic ulcer of other part of right foot limited to breakdown of skin: Secondary | ICD-10-CM | POA: Diagnosis not present

## 2018-06-25 NOTE — Patient Outreach (Signed)
[place] [facility]) Care Management [place]  06/25/2018  THAD OSORIA March 27, 1951 [id number]  Reason for referral: medication management, medication assistance  [facility] pharmacy case is being closed due to the following reasons:  We have been unable to establish and/or maintain contact with the patient. I am happy to assist in the future if patient interested in engaging with [facility].   Ralene Bathe, PharmD, [place] [phone]

## 2018-06-27 DIAGNOSIS — T8744 Infection of amputation stump, left lower extremity: Secondary | ICD-10-CM | POA: Diagnosis not present

## 2018-06-27 DIAGNOSIS — E1151 Type 2 diabetes mellitus with diabetic peripheral angiopathy without gangrene: Secondary | ICD-10-CM | POA: Diagnosis not present

## 2018-06-27 DIAGNOSIS — E1169 Type 2 diabetes mellitus with other specified complication: Secondary | ICD-10-CM | POA: Diagnosis not present

## 2018-06-27 DIAGNOSIS — E11621 Type 2 diabetes mellitus with foot ulcer: Secondary | ICD-10-CM | POA: Diagnosis not present

## 2018-06-27 DIAGNOSIS — L97511 Non-pressure chronic ulcer of other part of right foot limited to breakdown of skin: Secondary | ICD-10-CM | POA: Diagnosis not present

## 2018-06-27 DIAGNOSIS — M86172 Other acute osteomyelitis, left ankle and foot: Secondary | ICD-10-CM | POA: Diagnosis not present

## 2018-06-27 DIAGNOSIS — L03116 Cellulitis of left lower limb: Secondary | ICD-10-CM | POA: Diagnosis not present

## 2018-06-27 DIAGNOSIS — I10 Essential (primary) hypertension: Secondary | ICD-10-CM | POA: Diagnosis not present

## 2018-06-27 DIAGNOSIS — I739 Peripheral vascular disease, unspecified: Secondary | ICD-10-CM | POA: Diagnosis not present

## 2018-06-28 ENCOUNTER — Other Ambulatory Visit: Payer: Self-pay

## 2018-06-28 DIAGNOSIS — L97511 Non-pressure chronic ulcer of other part of right foot limited to breakdown of skin: Secondary | ICD-10-CM | POA: Diagnosis not present

## 2018-06-28 DIAGNOSIS — I739 Peripheral vascular disease, unspecified: Secondary | ICD-10-CM

## 2018-06-28 MED ORDER — CLOPIDOGREL BISULFATE 75 MG PO TABS: 75.0000 mg | ORAL_TABLET | Freq: Every day | ORAL | 11 refills | Status: DC

## 2018-06-29 ENCOUNTER — Other Ambulatory Visit: Payer: Self-pay | Admitting: Pharmacist

## 2018-06-29 DIAGNOSIS — E1151 Type 2 diabetes mellitus with diabetic peripheral angiopathy without gangrene: Secondary | ICD-10-CM | POA: Diagnosis not present

## 2018-06-29 DIAGNOSIS — L03116 Cellulitis of left lower limb: Secondary | ICD-10-CM | POA: Diagnosis not present

## 2018-06-29 DIAGNOSIS — I10 Essential (primary) hypertension: Secondary | ICD-10-CM | POA: Diagnosis not present

## 2018-06-29 DIAGNOSIS — I739 Peripheral vascular disease, unspecified: Secondary | ICD-10-CM | POA: Diagnosis not present

## 2018-06-29 DIAGNOSIS — T8744 Infection of amputation stump, left lower extremity: Secondary | ICD-10-CM | POA: Diagnosis not present

## 2018-06-29 DIAGNOSIS — E11621 Type 2 diabetes mellitus with foot ulcer: Secondary | ICD-10-CM | POA: Diagnosis not present

## 2018-06-29 DIAGNOSIS — L97511 Non-pressure chronic ulcer of other part of right foot limited to breakdown of skin: Secondary | ICD-10-CM | POA: Diagnosis not present

## 2018-06-29 DIAGNOSIS — M86172 Other acute osteomyelitis, left ankle and foot: Secondary | ICD-10-CM | POA: Diagnosis not present

## 2018-06-29 DIAGNOSIS — E1169 Type 2 diabetes mellitus with other specified complication: Secondary | ICD-10-CM | POA: Diagnosis not present

## 2018-06-29 NOTE — Patient Outreach (Signed)
[place] [facility]) Care Management  [place] 06/29/2018  REDFORD BEHRLE [birth year] [id number]  Incoming call from LPN, Varney Biles, with [place], regarding Mr. Karin Griffith.    Note [facility] pharmacy referral placed on 06/14/2018.  Case closed due to inability to contact patient with 3 unsuccessful call attempts.     Varney Biles states Mr. Trapani is in need of medication assistance with Lantus.  He was approved for patient assistance program through [place] in 2019 and has 5638 application in his home.  This program requires that patient spend 2% of gross annual income on co-pays before eligible to apply for assistance in 2020.  We reviewed program requirements and estimate of what 2% expenditure is for patient (~$400).  Patient currently using a very high amount of Lantus / month (vials) and insurance [facility]) is not covering this.  Also discussed substitution to [employer] (basaglar) to avoid having to pay any out-of-pocket expenditure.  Varney Biles will review this information with patient.  She has home visits with patient M/W/F ~10AM and will continue to work on patient assistance with him and provider.  She will contact me if she needs further assistance.    Plan: Will continue to keep case closed.  I am happy to assist if patient willing to engage with [facility].   Ralene Bathe, PharmD, [place] [phone]

## 2018-07-02 DIAGNOSIS — T8744 Infection of amputation stump, left lower extremity: Secondary | ICD-10-CM | POA: Diagnosis not present

## 2018-07-02 DIAGNOSIS — E1151 Type 2 diabetes mellitus with diabetic peripheral angiopathy without gangrene: Secondary | ICD-10-CM | POA: Diagnosis not present

## 2018-07-02 DIAGNOSIS — E11621 Type 2 diabetes mellitus with foot ulcer: Secondary | ICD-10-CM | POA: Diagnosis not present

## 2018-07-02 DIAGNOSIS — E1169 Type 2 diabetes mellitus with other specified complication: Secondary | ICD-10-CM | POA: Diagnosis not present

## 2018-07-02 DIAGNOSIS — I739 Peripheral vascular disease, unspecified: Secondary | ICD-10-CM | POA: Diagnosis not present

## 2018-07-02 DIAGNOSIS — M86172 Other acute osteomyelitis, left ankle and foot: Secondary | ICD-10-CM | POA: Diagnosis not present

## 2018-07-02 DIAGNOSIS — L97511 Non-pressure chronic ulcer of other part of right foot limited to breakdown of skin: Secondary | ICD-10-CM | POA: Diagnosis not present

## 2018-07-02 DIAGNOSIS — L03116 Cellulitis of left lower limb: Secondary | ICD-10-CM | POA: Diagnosis not present

## 2018-07-02 DIAGNOSIS — I10 Essential (primary) hypertension: Secondary | ICD-10-CM | POA: Diagnosis not present

## 2018-07-05 ENCOUNTER — Encounter: Payer: Self-pay | Admitting: Adult Health

## 2018-07-05 ENCOUNTER — Ambulatory Visit (INDEPENDENT_AMBULATORY_CARE_PROVIDER_SITE_OTHER): Payer: Medicare HMO | Admitting: Adult Health

## 2018-07-05 VITALS — BP 130/86 | HR 64 | Resp 16 | Ht 69.0 in | Wt 291.0 lb

## 2018-07-05 DIAGNOSIS — E1165 Type 2 diabetes mellitus with hyperglycemia: Secondary | ICD-10-CM | POA: Diagnosis not present

## 2018-07-05 DIAGNOSIS — I10 Essential (primary) hypertension: Secondary | ICD-10-CM | POA: Diagnosis not present

## 2018-07-05 DIAGNOSIS — Z9989 Dependence on other enabling machines and devices: Secondary | ICD-10-CM

## 2018-07-05 DIAGNOSIS — F339 Major depressive disorder, recurrent, unspecified: Secondary | ICD-10-CM | POA: Diagnosis not present

## 2018-07-05 DIAGNOSIS — G4733 Obstructive sleep apnea (adult) (pediatric): Secondary | ICD-10-CM | POA: Diagnosis not present

## 2018-07-05 LAB — POCT GLYCOSYLATED HEMOGLOBIN (HGB A1C): Hemoglobin A1C: 8.8 % — AB (ref 4.0–5.6)

## 2018-07-05 MED ORDER — BASAGLAR KWIKPEN 100 UNIT/ML ~~LOC~~ SOPN: 70.0000 [IU] | PEN_INJECTOR | Freq: Every day | SUBCUTANEOUS | 3 refills | Status: DC

## 2018-07-05 MED ORDER — "INSULIN SYRINGE 29G X 1/2"" 1 ML MISC": 5 refills | Status: DC

## 2018-07-05 NOTE — Patient Instructions (Signed)
Diabetes Mellitus and Nutrition, Adult  When you have diabetes (diabetes mellitus), it is very important to have healthy eating habits because your blood sugar (glucose) levels are greatly affected by what you eat and drink. Eating healthy foods in the appropriate amounts, at about the same times every day, can help you:  · Control your blood glucose.  · Lower your risk of heart disease.  · Improve your blood pressure.  · Reach or maintain a healthy weight.  Every person with diabetes is different, and each person has different needs for a meal plan. Your health care provider may recommend that you work with a diet and nutrition specialist (dietitian) to make a meal plan that is best for you. Your meal plan may vary depending on factors such as:  · The calories you need.  · The medicines you take.  · Your weight.  · Your blood glucose, blood pressure, and cholesterol levels.  · Your activity level.  · Other health conditions you have, such as heart or kidney disease.  How do carbohydrates affect me?  Carbohydrates, also called carbs, affect your blood glucose level more than any other type of food. Eating carbs naturally raises the amount of glucose in your blood. Carb counting is a method for keeping track of how many carbs you eat. Counting carbs is important to keep your blood glucose at a healthy level, especially if you use insulin or take certain oral diabetes medicines.  It is important to know how many carbs you can safely have in each meal. This is different for every person. Your dietitian can help you calculate how many carbs you should have at each meal and for each snack.  Foods that contain carbs include:  · Bread, cereal, rice, pasta, and crackers.  · Potatoes and corn.  · Peas, beans, and lentils.  · Milk and yogurt.  · Fruit and juice.  · Desserts, such as cakes, cookies, ice cream, and candy.  How does alcohol affect me?  Alcohol can cause a sudden decrease in blood glucose (hypoglycemia),  especially if you use insulin or take certain oral diabetes medicines. Hypoglycemia can be a life-threatening condition. Symptoms of hypoglycemia (sleepiness, dizziness, and confusion) are similar to symptoms of having too much alcohol.  If your health care provider says that alcohol is safe for you, follow these guidelines:  · Limit alcohol intake to no more than 1 drink per day for nonpregnant women and 2 drinks per day for men. One drink equals 12 oz of beer, 5 oz of wine, or 1½ oz of hard liquor.  · Do not drink on an empty stomach.  · Keep yourself hydrated with water, diet soda, or unsweetened iced tea.  · Keep in mind that regular soda, juice, and other mixers may contain a lot of sugar and must be counted as carbs.  What are tips for following this plan?    Reading food labels  · Start by checking the serving size on the "Nutrition Facts" label of packaged foods and drinks. The amount of calories, carbs, fats, and other nutrients listed on the label is based on one serving of the item. Many items contain more than one serving per package.  · Check the total grams (g) of carbs in one serving. You can calculate the number of servings of carbs in one serving by dividing the total carbs by 15. For example, if a food has 30 g of total carbs, it would be equal to 2   servings of carbs.  · Check the number of grams (g) of saturated and trans fats in one serving. Choose foods that have low or no amount of these fats.  · Check the number of milligrams (mg) of salt (sodium) in one serving. Most people should limit total sodium intake to less than 2,300 mg per day.  · Always check the nutrition information of foods labeled as "low-fat" or "nonfat". These foods may be higher in added sugar or refined carbs and should be avoided.  · Talk to your dietitian to identify your daily goals for nutrients listed on the label.  Shopping  · Avoid buying canned, premade, or processed foods. These foods tend to be high in fat, sodium,  and added sugar.  · Shop around the outside edge of the grocery store. This includes fresh fruits and vegetables, bulk grains, fresh meats, and fresh dairy.  Cooking  · Use low-heat cooking methods, such as baking, instead of high-heat cooking methods like deep frying.  · Cook using healthy oils, such as olive, canola, or sunflower oil.  · Avoid cooking with butter, cream, or high-fat meats.  Meal planning  · Eat meals and snacks regularly, preferably at the same times every day. Avoid going long periods of time without eating.  · Eat foods high in fiber, such as fresh fruits, vegetables, beans, and whole grains. Talk to your dietitian about how many servings of carbs you can eat at each meal.  · Eat 4-6 ounces (oz) of lean protein each day, such as lean meat, chicken, fish, eggs, or tofu. One oz of lean protein is equal to:  ? 1 oz of meat, chicken, or fish.  ? 1 egg.  ? ¼ cup of tofu.  · Eat some foods each day that contain healthy fats, such as avocado, nuts, seeds, and fish.  Lifestyle  · Check your blood glucose regularly.  · Exercise regularly as told by your health care provider. This may include:  ? 150 minutes of moderate-intensity or vigorous-intensity exercise each week. This could be brisk walking, biking, or water aerobics.  ? Stretching and doing strength exercises, such as yoga or weightlifting, at least 2 times a week.  · Take medicines as told by your health care provider.  · Do not use any products that contain nicotine or tobacco, such as cigarettes and e-cigarettes. If you need help quitting, ask your health care provider.  · Work with a counselor or diabetes educator to identify strategies to manage stress and any emotional and social challenges.  Questions to ask a health care provider  · Do I need to meet with a diabetes educator?  · Do I need to meet with a dietitian?  · What number can I call if I have questions?  · When are the best times to check my blood glucose?  Where to find more  information:  · American Diabetes Association: diabetes.org  · Academy of Nutrition and Dietetics: www.eatright.org  · National Institute of Diabetes and Digestive and Kidney Diseases (NIH): www.niddk.nih.gov  Summary  · A healthy meal plan will help you control your blood glucose and maintain a healthy lifestyle.  · Working with a diet and nutrition specialist (dietitian) can help you make a meal plan that is best for you.  · Keep in mind that carbohydrates (carbs) and alcohol have immediate effects on your blood glucose levels. It is important to count carbs and to use alcohol carefully.  This information is not intended to   replace advice given to you by your health care provider. Make sure you discuss any questions you have with your health care provider.  Document Released: 01/27/2005 Document Revised: 11/30/2016 Document Reviewed: 06/06/2016  Elsevier Interactive Patient Education © 2019 Elsevier Inc.

## 2018-07-05 NOTE — Progress Notes (Signed)
[facility] [place], [place] [address]  Internal MEDICINE  Office Visit Note  Patient Name: Victor Wise  [id number]  [id number]  Date of Service: 07/05/2018  Chief Complaint  Patient presents with  . Diabetes    discuss medication   . Depression  . Hypertension    HPI Pt is here for follow up on DM, Depression and HTN.  Pts A1C is 8.8 today.  He reports his insurance company would like to switch his lantus to basaglar. Will send a new RX at this visit.  His blood pressure is excellent today, 130/86.  He denies any issues with his depression currently.      Current Medication: Outpatient Encounter Medications as of 07/05/2018  Medication Sig Note  . acetaminophen (TYLENOL) 325 MG tablet Take 2 tablets (650 mg total) by mouth every 6 (six) hours as needed for mild pain (or Fever >/= 101). 06/04/2018: PRN  . amLODipine (NORVASC) 10 MG tablet Take 1 tablet (10 mg total) by mouth daily.   [name] aspirin EC 81 MG tablet Take 81 mg by mouth daily.   . benazepril (LOTENSIN) 20 MG tablet Take 1 tablet (20 mg total) by mouth 2 (two) times daily.   . clopidogrel (PLAVIX) 75 MG tablet Take 1 tablet (75 mg total) by mouth daily.   . DULoxetine (CYMBALTA) 60 MG capsule Take 1 capsule (60 mg total) by mouth daily.   . Exenatide ER (BYDUREON) 2 MG PEN Inject 2 mg into the skin once a week.   . fenofibrate 54 MG tablet TAKE 1 TABLET BY MOUTH A DAY AT SUPPERTIME   . fluticasone (FLONASE) 50 MCG/ACT nasal spray Place 2 sprays into both nostrils daily.   . hydrochlorothiazide (HYDRODIURIL) 12.5 MG tablet Take 1 tablet (12.5 mg total) by mouth daily.   . insulin regular (NOVOLIN R) 100 units/mL injection Patient to use Novolin R [place] TiD prior to meals per sliding scale instructions. Max daily dose is 45 units per day.   . INSULIN SYRINGE 1CC/29G 29G X 1/2" 1 ML MISC Insulin injections QID, use with lantus and regular insulin. DX E11.65   . Insulin Syringe-Needle U-100 (BD INSULIN  SYRINGE U/F 1/2UNIT) 31G X 5/16" 0.3 ML MISC Indulin injection QID. Use with lantus and regular insulin Dx. 11.65   . NARCAN 4 MG/0.1ML LIQD Take 4 mg by mouth as needed (for opioid overdose).    . nitroGLYCERIN (NITROSTAT) 0.4 MG SL tablet Place 1 tablet (0.4 mg total) under the tongue every 5 (five) minutes x 3 doses as needed for chest pain.   [name] omega-3 acid ethyl esters (LOVAZA) 1 g capsule Take 1 capsule (1 g total) by mouth 2 (two) times daily.   . Oxcarbazepine (TRILEPTAL) 300 MG tablet Take 300-600 mg by mouth See admin instructions. Take 1 tablet (300MG ) by mouth every morning and 2 tablets (600MG ) by mouth every night   . oxyCODONE (OXY IR/ROXICODONE) 5 MG immediate release tablet Take 1 tablet (5 mg total) by mouth every 4 (four) hours as needed for moderate pain.   [name] oxyCODONE (OXYCONTIN) 20 mg 12 hr tablet Take by mouth.   . pantoprazole (PROTONIX) 40 MG tablet TAKE 1 TABLET BY MOUTH A DAY FOR REFLUX   . polyethylene glycol (MIRALAX / GLYCOLAX) packet Take 17 g by mouth daily as needed. 06/04/2018: PRN  . pregabalin (LYRICA) 200 MG capsule Take 200 mg by mouth See admin instructions. Take 2 capsules (400MG ) by mouth every morning and  1 capsule (200MG ) by mouth every night   . tamsulosin (FLOMAX) 0.4 MG CAPS capsule Take 1 capsule (0.4 mg total) by mouth daily after supper.   . [DISCONTINUED] insulin glargine (LANTUS) 100 UNIT/ML injection Inject 0.7 mLs (70 Units total) into the skin daily.   . [DISCONTINUED] INSULIN SYRINGE 1CC/29G 29G X 1/2" 1 ML MISC Insulin injections QID, use with lantus and regular insulin. DX E11.65   . Insulin Glargine (BASAGLAR KWIKPEN) 100 UNIT/ML SOPN Inject 0.7 mLs (70 Units total) into the skin daily.   . Insulin Pen Needle (B-D UF III MINI PEN NEEDLES) 31G X 5 MM MISC Use as directed with insulin e11.65   . oxyCODONE (OXYCONTIN) 10 mg 12 hr tablet Take 1 tablet (10 mg total) by mouth every 12 (twelve) hours. (Patient not taking: Reported on 07/05/2018)   .  [DISCONTINUED] fluconazole (DIFLUCAN) 200 MG tablet Take 2 tablets (400 mg total) by mouth daily. (Patient not taking: Reported on 06/04/2018)   . [DISCONTINUED] ibuprofen (ADVIL,MOTRIN) 400 MG tablet Take 400 mg by mouth every 6 (six) hours as needed (1-2 tablets prn).   . [DISCONTINUED] methocarbamol (ROBAXIN) 500 MG tablet Take 1 tablet (500 mg total) by mouth every 8 (eight) hours as needed for muscle spasms. (Patient not taking: Reported on 06/04/2018)   . [DISCONTINUED] senna (SENOKOT) 8.6 MG TABS tablet Take 1 tablet (8.6 mg total) by mouth 2 (two) times daily. (Patient not taking: Reported on 06/04/2018)    No facility-administered encounter medications on file as of 07/05/2018.     Surgical History: Past Surgical History:  Procedure Laterality Date  . AMPUTATION TOE Left 04/19/2018   Procedure: AMPUTATION TOE-LEFT GREAT TOE;  Surgeon: Sharlotte Alamo, DPM;  Location: [facility] OR[facility];  Service: Podiatry;  Laterality: Left;  . CARDIAC CATHETERIZATION N/A 12/19/2014   Procedure: Coronary Stent Intervention;  Surgeon: Charolette Forward, MD;  Location: [place] [facility];  Service: Cardiovascular;  Laterality: N/A;  . CARDIAC CATHETERIZATION Left 12/19/2014   Procedure: Left Heart Cath and Coronary Angiography;  Surgeon: Dionisio Marquese, MD;  Location: [place] [facility];  Service: Cardiovascular;  Laterality: Left;  . IRRIGATION AND DEBRIDEMENT FOOT Left 03/02/2018   Procedure: IRRIGATION AND DEBRIDEMENT FOOT;  Surgeon: Samara Deist, DPM;  Location: [facility];  Service: Podiatry;  Laterality: Left;  . IRRIGATION AND DEBRIDEMENT FOOT Left 03/08/2018   Procedure: IRRIGATION AND DEBRIDEMENT FOOT AND BONE;  Surgeon: Sharlotte Alamo, DPM;  Location: [facility];  Service: Podiatry;  Laterality: Left;  . IRRIGATION AND DEBRIDEMENT FOOT Left 04/19/2018   Procedure: IRRIGATION AND DEBRIDEMENT FOOT;  Surgeon: Sharlotte Alamo, DPM;  Location: [facility];  Service: Podiatry;  Laterality: Left;  . LOWER EXTREMITY ANGIOGRAPHY Left  03/05/2018   Procedure: Lower Extremity Angiography;  Surgeon: Algernon Huxley, MD;  Location: [place] [facility];  Service: Cardiovascular;  Laterality: Left;  . LOWER EXTREMITY ANGIOGRAPHY Left 03/08/2018   Procedure: LOWER EXTREMITY ANGIOGRAPHY;  Surgeon: Algernon Huxley, MD;  Location: [place] [facility];  Service: Cardiovascular;  Laterality: Left;  . PACEMAKER INSERTION Left 11/02/2015   Procedure: INSERTION PACEMAKER;  Surgeon: Isaias Cowman, MD;  Location: [facility];  Service: Cardiovascular;  Laterality: Left;  . Pain Stimulator    . Right toe amputation      Medical History: Past Medical History:  Diagnosis Date  . Allergy   . Basal cell carcinoma    forehead  . BPH (benign prostatic hyperplasia)   . Chronic back pain   . Depression   .  Diabetes ([place])   . GERD (gastroesophageal reflux disease)   . Heart attack ([place])   . Hypertension   . Morbid obesity ([place])   . Obstructive sleep apnea   . 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 Status post insertion of spinal cord stimulator   . Stroke ([place])   . UTI (lower urinary tract infection)     Family History: Family History  Problem Relation Age of Onset  . Breast cancer Mother   . Cancer Mother   . Hypertension Mother   . Lung cancer Father   . Hypertension Father   . Heart disease Father   . Cancer Father   . Kidney disease Sister   . Prostate cancer Neg Hx     Social History   Socioeconomic History  . Marital status: Divorced    Spouse name: Not on file  . Number of children: Not on file  . Years of education: Not on file  . Highest education level: Not on file  Occupational History  . Not on file  Social Needs  . Financial resource strain: Not on file  . Food insecurity:    Worry: Not on file    Inability: Not on file  . Transportation needs:    Medical: Not on file    Non-medical: Not on file  Tobacco Use  . Smoking status: Former Smoker    Types: Cigarettes  . Smokeless tobacco: Never Used  .  Tobacco comment: quit 45 years  Substance and Sexual Activity  . Alcohol use: Not Currently    Alcohol/week: 0.0 standard drinks    Comment: have not had alcohol in 72yrs  . Drug use: No  . Sexual activity: Not on file  Lifestyle  . Physical activity:    Days per week: Not on file    Minutes per session: Not on file  . Stress: Not on file  Relationships  . Social connections:    Talks on phone: Not on file    Gets together: Not on file    Attends religious service: Not on file    Active member of club or organization: Not on file    Attends meetings of clubs or organizations: Not on file    Relationship status: Not on file  . Intimate partner violence:    Fear of current or ex partner: Not on file    Emotionally abused: Not on file    Physically abused: Not on file    Forced sexual activity: Not on file  Other Topics Concern  . Not on file  Social History Narrative  . Not on file      Review of Systems  Constitutional: Negative.  Negative for chills, fatigue and unexpected weight change.  HENT: Negative.  Negative for congestion, rhinorrhea, sneezing and sore throat.   Eyes: Negative for redness.  Respiratory: Negative.  Negative for cough, chest tightness and shortness of breath.   Cardiovascular: Negative.  Negative for chest pain and palpitations.  Gastrointestinal: Negative.  Negative for abdominal pain, constipation, diarrhea, nausea and vomiting.  Endocrine: Negative.   Genitourinary: Negative.  Negative for dysuria and frequency.  Musculoskeletal: Negative.  Negative for arthralgias, back pain, joint swelling and neck pain.  Skin: Negative.  Negative for rash.  Allergic/Immunologic: Negative.   Neurological: Negative.  Negative for tremors and numbness.  Hematological: Negative for adenopathy. Does not bruise/bleed easily.  Psychiatric/Behavioral: Negative.  Negative for behavioral problems, sleep disturbance and suicidal ideas. The patient is not nervous/anxious.  Vital Signs: BP 130/86   Pulse 64   Resp 16   Ht 5\' 9"  (1.753 m)   Wt 291 lb (132 kg)   SpO2 91%   BMI 42.97 kg/m    Physical Exam Vitals signs and nursing note reviewed.  Constitutional:      General: He is not in acute distress.    Appearance: He is well-developed. He is not diaphoretic.  HENT:     Head: Normocephalic and atraumatic.     Mouth/Throat:     Pharynx: No oropharyngeal exudate.  Eyes:     Pupils: Pupils are equal, round, and reactive to light.  Neck:     Musculoskeletal: Normal range of motion and neck supple.     Thyroid: No thyromegaly.     Vascular: No JVD.     Trachea: No tracheal deviation.  Cardiovascular:     Rate and Rhythm: Normal rate and regular rhythm.     Heart sounds: Normal heart sounds. No murmur. No friction rub. No gallop.   Pulmonary:     Effort: Pulmonary effort is normal. No respiratory distress.     Breath sounds: Normal breath sounds. No wheezing or rales.  Chest:     Chest wall: No tenderness.  Abdominal:     Palpations: Abdomen is soft.     Tenderness: There is no abdominal tenderness. There is no guarding.  Musculoskeletal: Normal range of motion.  Lymphadenopathy:     Cervical: No cervical adenopathy.  Skin:    General: Skin is warm and dry.  Neurological:     Mental Status: He is alert and oriented to person, place, and time.     Cranial Nerves: No cranial nerve deficit.  Psychiatric:        Behavior: Behavior normal.        Thought Content: Thought content normal.        Judgment: Judgment normal.     Assessment/Plan: 1. Uncontrolled type 2 diabetes mellitus with hyperglycemia (HCC) Patient's A1c 8.8 today.  We will switch his Lantus to [employer].  Prescription sent to pharmacy.  Also ordered new syringes for him for his regular insulin.  He denies any further need at this time. - POCT HgB A1C - Insulin Glargine (BASAGLAR KWIKPEN) 100 UNIT/ML SOPN; Inject 0.7 mLs (70 Units total) into the skin daily.  Dispense:  5 pen; Refill: 3 - INSULIN SYRINGE 1CC/29G 29G X 1/2" 1 ML MISC; Insulin injections QID, use with lantus and regular insulin. DX E11.65  Dispense: 120 each; Refill: 5  2. Essential hypertension Stable, continue current medications as prescribed.  3. Depression, recurrent ([place]) Stable, patient denies any issues at this time.  4. OSA on CPAP Stable, patient is to wear CPAP and reports good results with machine.  5. Obesity, Class III, BMI 40-49.9 (morbid obesity) (HCC) Obesity Counseling: Risk Assessment: An assessment of behavioral risk factors was made today and includes lack of exercise sedentary lifestyle, lack of portion control and poor dietary habits.  Risk Modification Advice: She was counseled on portion control guidelines. Restricting daily caloric intake to. . The detrimental long term effects of obesity on her health and ongoing poor compliance was also discussed with the patient.    General Counseling: rihan schueler understanding of the findings of todays visit and agrees with plan of treatment. I have discussed any further diagnostic evaluation that may be needed or ordered today. We also reviewed his medications today. he has been encouraged to call the office with any questions or  concerns that should arise related to todays visit.    Orders Placed This Encounter  Procedures  . POCT HgB A1C    Meds ordered this encounter  Medications  . Insulin Glargine (BASAGLAR KWIKPEN) 100 UNIT/ML SOPN    Sig: Inject 0.7 mLs (70 Units total) into the skin daily.    Dispense:  5 pen    Refill:  3  . INSULIN SYRINGE 1CC/29G 29G X 1/2" 1 ML MISC    Sig: Insulin injections QID, use with lantus and regular insulin. DX E11.65    Dispense:  120 each    Refill:  5    Time spent: 25 Minutes   This patient was seen by Orson Gear AGNP-C in Collaboration with Dr Lavera Guise as a part of collaborative care agreement     Kendell Bane AGNP-C Internal medicine

## 2018-07-06 DIAGNOSIS — I739 Peripheral vascular disease, unspecified: Secondary | ICD-10-CM | POA: Diagnosis not present

## 2018-07-06 DIAGNOSIS — T8744 Infection of amputation stump, left lower extremity: Secondary | ICD-10-CM | POA: Diagnosis not present

## 2018-07-06 DIAGNOSIS — L97511 Non-pressure chronic ulcer of other part of right foot limited to breakdown of skin: Secondary | ICD-10-CM | POA: Diagnosis not present

## 2018-07-06 DIAGNOSIS — M86172 Other acute osteomyelitis, left ankle and foot: Secondary | ICD-10-CM | POA: Diagnosis not present

## 2018-07-06 DIAGNOSIS — I10 Essential (primary) hypertension: Secondary | ICD-10-CM | POA: Diagnosis not present

## 2018-07-06 DIAGNOSIS — E1169 Type 2 diabetes mellitus with other specified complication: Secondary | ICD-10-CM | POA: Diagnosis not present

## 2018-07-06 DIAGNOSIS — L03116 Cellulitis of left lower limb: Secondary | ICD-10-CM | POA: Diagnosis not present

## 2018-07-06 DIAGNOSIS — E11621 Type 2 diabetes mellitus with foot ulcer: Secondary | ICD-10-CM | POA: Diagnosis not present

## 2018-07-06 DIAGNOSIS — E1151 Type 2 diabetes mellitus with diabetic peripheral angiopathy without gangrene: Secondary | ICD-10-CM | POA: Diagnosis not present

## 2018-07-09 DIAGNOSIS — E1169 Type 2 diabetes mellitus with other specified complication: Secondary | ICD-10-CM | POA: Diagnosis not present

## 2018-07-09 DIAGNOSIS — T8744 Infection of amputation stump, left lower extremity: Secondary | ICD-10-CM | POA: Diagnosis not present

## 2018-07-09 DIAGNOSIS — I739 Peripheral vascular disease, unspecified: Secondary | ICD-10-CM | POA: Diagnosis not present

## 2018-07-09 DIAGNOSIS — L03116 Cellulitis of left lower limb: Secondary | ICD-10-CM | POA: Diagnosis not present

## 2018-07-09 DIAGNOSIS — L97511 Non-pressure chronic ulcer of other part of right foot limited to breakdown of skin: Secondary | ICD-10-CM | POA: Diagnosis not present

## 2018-07-09 DIAGNOSIS — M86172 Other acute osteomyelitis, left ankle and foot: Secondary | ICD-10-CM | POA: Diagnosis not present

## 2018-07-09 DIAGNOSIS — E11621 Type 2 diabetes mellitus with foot ulcer: Secondary | ICD-10-CM | POA: Diagnosis not present

## 2018-07-09 DIAGNOSIS — E1151 Type 2 diabetes mellitus with diabetic peripheral angiopathy without gangrene: Secondary | ICD-10-CM | POA: Diagnosis not present

## 2018-07-09 DIAGNOSIS — I10 Essential (primary) hypertension: Secondary | ICD-10-CM | POA: Diagnosis not present

## 2018-07-11 DIAGNOSIS — I10 Essential (primary) hypertension: Secondary | ICD-10-CM | POA: Diagnosis not present

## 2018-07-11 DIAGNOSIS — E1151 Type 2 diabetes mellitus with diabetic peripheral angiopathy without gangrene: Secondary | ICD-10-CM | POA: Diagnosis not present

## 2018-07-11 DIAGNOSIS — E1169 Type 2 diabetes mellitus with other specified complication: Secondary | ICD-10-CM | POA: Diagnosis not present

## 2018-07-11 DIAGNOSIS — M86172 Other acute osteomyelitis, left ankle and foot: Secondary | ICD-10-CM | POA: Diagnosis not present

## 2018-07-11 DIAGNOSIS — T8744 Infection of amputation stump, left lower extremity: Secondary | ICD-10-CM | POA: Diagnosis not present

## 2018-07-11 DIAGNOSIS — L03116 Cellulitis of left lower limb: Secondary | ICD-10-CM | POA: Diagnosis not present

## 2018-07-11 DIAGNOSIS — E11621 Type 2 diabetes mellitus with foot ulcer: Secondary | ICD-10-CM | POA: Diagnosis not present

## 2018-07-11 DIAGNOSIS — I739 Peripheral vascular disease, unspecified: Secondary | ICD-10-CM | POA: Diagnosis not present

## 2018-07-11 DIAGNOSIS — L97511 Non-pressure chronic ulcer of other part of right foot limited to breakdown of skin: Secondary | ICD-10-CM | POA: Diagnosis not present

## 2018-07-13 DIAGNOSIS — I10 Essential (primary) hypertension: Secondary | ICD-10-CM | POA: Diagnosis not present

## 2018-07-13 DIAGNOSIS — E11621 Type 2 diabetes mellitus with foot ulcer: Secondary | ICD-10-CM | POA: Diagnosis not present

## 2018-07-13 DIAGNOSIS — L97511 Non-pressure chronic ulcer of other part of right foot limited to breakdown of skin: Secondary | ICD-10-CM | POA: Diagnosis not present

## 2018-07-13 DIAGNOSIS — M86172 Other acute osteomyelitis, left ankle and foot: Secondary | ICD-10-CM | POA: Diagnosis not present

## 2018-07-13 DIAGNOSIS — E1169 Type 2 diabetes mellitus with other specified complication: Secondary | ICD-10-CM | POA: Diagnosis not present

## 2018-07-13 DIAGNOSIS — T8744 Infection of amputation stump, left lower extremity: Secondary | ICD-10-CM | POA: Diagnosis not present

## 2018-07-13 DIAGNOSIS — L03116 Cellulitis of left lower limb: Secondary | ICD-10-CM | POA: Diagnosis not present

## 2018-07-13 DIAGNOSIS — E1151 Type 2 diabetes mellitus with diabetic peripheral angiopathy without gangrene: Secondary | ICD-10-CM | POA: Diagnosis not present

## 2018-07-13 DIAGNOSIS — I739 Peripheral vascular disease, unspecified: Secondary | ICD-10-CM | POA: Diagnosis not present

## 2018-07-16 DIAGNOSIS — E1151 Type 2 diabetes mellitus with diabetic peripheral angiopathy without gangrene: Secondary | ICD-10-CM | POA: Diagnosis not present

## 2018-07-16 DIAGNOSIS — E11621 Type 2 diabetes mellitus with foot ulcer: Secondary | ICD-10-CM | POA: Diagnosis not present

## 2018-07-16 DIAGNOSIS — M86172 Other acute osteomyelitis, left ankle and foot: Secondary | ICD-10-CM | POA: Diagnosis not present

## 2018-07-16 DIAGNOSIS — L03116 Cellulitis of left lower limb: Secondary | ICD-10-CM | POA: Diagnosis not present

## 2018-07-16 DIAGNOSIS — I10 Essential (primary) hypertension: Secondary | ICD-10-CM | POA: Diagnosis not present

## 2018-07-16 DIAGNOSIS — T8744 Infection of amputation stump, left lower extremity: Secondary | ICD-10-CM | POA: Diagnosis not present

## 2018-07-16 DIAGNOSIS — I739 Peripheral vascular disease, unspecified: Secondary | ICD-10-CM | POA: Diagnosis not present

## 2018-07-16 DIAGNOSIS — L97511 Non-pressure chronic ulcer of other part of right foot limited to breakdown of skin: Secondary | ICD-10-CM | POA: Diagnosis not present

## 2018-07-16 DIAGNOSIS — E1169 Type 2 diabetes mellitus with other specified complication: Secondary | ICD-10-CM | POA: Diagnosis not present

## 2018-07-23 ENCOUNTER — Ambulatory Visit (INDEPENDENT_AMBULATORY_CARE_PROVIDER_SITE_OTHER): Payer: Medicare HMO | Admitting: Adult Health

## 2018-07-23 ENCOUNTER — Encounter: Payer: Self-pay | Admitting: Adult Health

## 2018-07-23 ENCOUNTER — Other Ambulatory Visit: Payer: Self-pay

## 2018-07-23 VITALS — BP 126/88 | HR 60 | Resp 16 | Ht 68.75 in | Wt 302.0 lb

## 2018-07-23 DIAGNOSIS — Z6841 Body Mass Index (BMI) 40.0 and over, adult: Secondary | ICD-10-CM | POA: Diagnosis not present

## 2018-07-23 DIAGNOSIS — E11628 Type 2 diabetes mellitus with other skin complications: Secondary | ICD-10-CM

## 2018-07-23 DIAGNOSIS — I110 Hypertensive heart disease with heart failure: Secondary | ICD-10-CM

## 2018-07-23 DIAGNOSIS — T8744 Infection of amputation stump, left lower extremity: Secondary | ICD-10-CM | POA: Diagnosis not present

## 2018-07-23 DIAGNOSIS — E1169 Type 2 diabetes mellitus with other specified complication: Secondary | ICD-10-CM | POA: Diagnosis not present

## 2018-07-23 DIAGNOSIS — E782 Mixed hyperlipidemia: Secondary | ICD-10-CM

## 2018-07-23 DIAGNOSIS — L089 Local infection of the skin and subcutaneous tissue, unspecified: Secondary | ICD-10-CM

## 2018-07-23 DIAGNOSIS — I10 Essential (primary) hypertension: Secondary | ICD-10-CM | POA: Diagnosis not present

## 2018-07-23 DIAGNOSIS — Z0001 Encounter for general adult medical examination with abnormal findings: Secondary | ICD-10-CM | POA: Diagnosis not present

## 2018-07-23 DIAGNOSIS — I739 Peripheral vascular disease, unspecified: Secondary | ICD-10-CM | POA: Diagnosis not present

## 2018-07-23 DIAGNOSIS — E66813 Obesity, class 3: Secondary | ICD-10-CM

## 2018-07-23 DIAGNOSIS — F339 Major depressive disorder, recurrent, unspecified: Secondary | ICD-10-CM | POA: Diagnosis not present

## 2018-07-23 DIAGNOSIS — Z9989 Dependence on other enabling machines and devices: Secondary | ICD-10-CM

## 2018-07-23 DIAGNOSIS — L03116 Cellulitis of left lower limb: Secondary | ICD-10-CM | POA: Diagnosis not present

## 2018-07-23 DIAGNOSIS — G4733 Obstructive sleep apnea (adult) (pediatric): Secondary | ICD-10-CM

## 2018-07-23 DIAGNOSIS — E11621 Type 2 diabetes mellitus with foot ulcer: Secondary | ICD-10-CM | POA: Diagnosis not present

## 2018-07-23 DIAGNOSIS — E1151 Type 2 diabetes mellitus with diabetic peripheral angiopathy without gangrene: Secondary | ICD-10-CM | POA: Diagnosis not present

## 2018-07-23 DIAGNOSIS — E1165 Type 2 diabetes mellitus with hyperglycemia: Secondary | ICD-10-CM

## 2018-07-23 DIAGNOSIS — M86172 Other acute osteomyelitis, left ankle and foot: Secondary | ICD-10-CM | POA: Diagnosis not present

## 2018-07-23 DIAGNOSIS — L97511 Non-pressure chronic ulcer of other part of right foot limited to breakdown of skin: Secondary | ICD-10-CM | POA: Diagnosis not present

## 2018-07-23 NOTE — Patient Instructions (Signed)
Diabetes Mellitus and Nutrition, Adult  When you have diabetes (diabetes mellitus), it is very important to have healthy eating habits because your blood sugar (glucose) levels are greatly affected by what you eat and drink. Eating healthy foods in the appropriate amounts, at about the same times every day, can help you:  · Control your blood glucose.  · Lower your risk of heart disease.  · Improve your blood pressure.  · Reach or maintain a healthy weight.  Every person with diabetes is different, and each person has different needs for a meal plan. Your health care provider may recommend that you work with a diet and nutrition specialist (dietitian) to make a meal plan that is best for you. Your meal plan may vary depending on factors such as:  · The calories you need.  · The medicines you take.  · Your weight.  · Your blood glucose, blood pressure, and cholesterol levels.  · Your activity level.  · Other health conditions you have, such as heart or kidney disease.  How do carbohydrates affect me?  Carbohydrates, also called carbs, affect your blood glucose level more than any other type of food. Eating carbs naturally raises the amount of glucose in your blood. Carb counting is a method for keeping track of how many carbs you eat. Counting carbs is important to keep your blood glucose at a healthy level, especially if you use insulin or take certain oral diabetes medicines.  It is important to know how many carbs you can safely have in each meal. This is different for every person. Your dietitian can help you calculate how many carbs you should have at each meal and for each snack.  Foods that contain carbs include:  · Bread, cereal, rice, pasta, and crackers.  · Potatoes and corn.  · Peas, beans, and lentils.  · Milk and yogurt.  · Fruit and juice.  · Desserts, such as cakes, cookies, ice cream, and candy.  How does alcohol affect me?  Alcohol can cause a sudden decrease in blood glucose (hypoglycemia),  especially if you use insulin or take certain oral diabetes medicines. Hypoglycemia can be a life-threatening condition. Symptoms of hypoglycemia (sleepiness, dizziness, and confusion) are similar to symptoms of having too much alcohol.  If your health care provider says that alcohol is safe for you, follow these guidelines:  · Limit alcohol intake to no more than 1 drink per day for nonpregnant women and 2 drinks per day for men. One drink equals 12 oz of beer, 5 oz of wine, or 1½ oz of hard liquor.  · Do not drink on an empty stomach.  · Keep yourself hydrated with water, diet soda, or unsweetened iced tea.  · Keep in mind that regular soda, juice, and other mixers may contain a lot of sugar and must be counted as carbs.  What are tips for following this plan?    Reading food labels  · Start by checking the serving size on the "Nutrition Facts" label of packaged foods and drinks. The amount of calories, carbs, fats, and other nutrients listed on the label is based on one serving of the item. Many items contain more than one serving per package.  · Check the total grams (g) of carbs in one serving. You can calculate the number of servings of carbs in one serving by dividing the total carbs by 15. For example, if a food has 30 g of total carbs, it would be equal to 2   servings of carbs.  · Check the number of grams (g) of saturated and trans fats in one serving. Choose foods that have low or no amount of these fats.  · Check the number of milligrams (mg) of salt (sodium) in one serving. Most people should limit total sodium intake to less than 2,300 mg per day.  · Always check the nutrition information of foods labeled as "low-fat" or "nonfat". These foods may be higher in added sugar or refined carbs and should be avoided.  · Talk to your dietitian to identify your daily goals for nutrients listed on the label.  Shopping  · Avoid buying canned, premade, or processed foods. These foods tend to be high in fat, sodium,  and added sugar.  · Shop around the outside edge of the grocery store. This includes fresh fruits and vegetables, bulk grains, fresh meats, and fresh dairy.  Cooking  · Use low-heat cooking methods, such as baking, instead of high-heat cooking methods like deep frying.  · Cook using healthy oils, such as olive, canola, or sunflower oil.  · Avoid cooking with butter, cream, or high-fat meats.  Meal planning  · Eat meals and snacks regularly, preferably at the same times every day. Avoid going long periods of time without eating.  · Eat foods high in fiber, such as fresh fruits, vegetables, beans, and whole grains. Talk to your dietitian about how many servings of carbs you can eat at each meal.  · Eat 4-6 ounces (oz) of lean protein each day, such as lean meat, chicken, fish, eggs, or tofu. One oz of lean protein is equal to:  ? 1 oz of meat, chicken, or fish.  ? 1 egg.  ? ¼ cup of tofu.  · Eat some foods each day that contain healthy fats, such as avocado, nuts, seeds, and fish.  Lifestyle  · Check your blood glucose regularly.  · Exercise regularly as told by your health care provider. This may include:  ? 150 minutes of moderate-intensity or vigorous-intensity exercise each week. This could be brisk walking, biking, or water aerobics.  ? Stretching and doing strength exercises, such as yoga or weightlifting, at least 2 times a week.  · Take medicines as told by your health care provider.  · Do not use any products that contain nicotine or tobacco, such as cigarettes and e-cigarettes. If you need help quitting, ask your health care provider.  · Work with a counselor or diabetes educator to identify strategies to manage stress and any emotional and social challenges.  Questions to ask a health care provider  · Do I need to meet with a diabetes educator?  · Do I need to meet with a dietitian?  · What number can I call if I have questions?  · When are the best times to check my blood glucose?  Where to find more  information:  · American Diabetes Association: diabetes.org  · Academy of Nutrition and Dietetics: www.eatright.org  · National Institute of Diabetes and Digestive and Kidney Diseases (NIH): www.niddk.nih.gov  Summary  · A healthy meal plan will help you control your blood glucose and maintain a healthy lifestyle.  · Working with a diet and nutrition specialist (dietitian) can help you make a meal plan that is best for you.  · Keep in mind that carbohydrates (carbs) and alcohol have immediate effects on your blood glucose levels. It is important to count carbs and to use alcohol carefully.  This information is not intended to   replace advice given to you by your health care provider. Make sure you discuss any questions you have with your health care provider.  Document Released: 01/27/2005 Document Revised: 11/30/2016 Document Reviewed: 06/06/2016  Elsevier Interactive Patient Education © 2019 Elsevier Inc.

## 2018-07-23 NOTE — Progress Notes (Signed)
[facility] [address], [place] [address]  Internal MEDICINE  Office Visit Note  Patient Name: Victor Wise  [id number]  [id number]  Date of Service: 07/23/2018  Chief Complaint  Patient presents with  . Annual Exam    medicare annual visit   . Diabetes  . Depression  . Gastroesophageal Reflux     HPI Pt is here for routine health maintenance examination.  Pt is a well appearing 68yo caucasian male. Pt reports he is doing "fair" at this time. He has a history of Diabetes, depression, gerd, and htn. He does not have any complaints currently. He reports his blood pressures are well controlled.  His A1C last month was 8.8, and he reports he has been immobile since October due to sores on his feet.  He has a home health nurse coming out weekly to change the dressing.  He is suppose to get out of his walking shoes this week. He denies any chest pain, sob, palpitations, headaches or other complaints.  He denies tobacco, alcohol, or illicit drug use.       Current Medication: Outpatient Encounter Medications as of 07/23/2018  Medication Sig Note  . acetaminophen (TYLENOL) 325 MG tablet Take 2 tablets (650 mg total) by mouth every 6 (six) hours as needed for mild pain (or Fever >/= 101). 06/04/2018: PRN  . amLODipine (NORVASC) 10 MG tablet Take 1 tablet (10 mg total) by mouth daily.   [name] aspirin EC 81 MG tablet Take 81 mg by mouth daily.   . benazepril (LOTENSIN) 20 MG tablet Take 1 tablet (20 mg total) by mouth 2 (two) times daily.   . clopidogrel (PLAVIX) 75 MG tablet Take 1 tablet (75 mg total) by mouth daily.   . DULoxetine (CYMBALTA) 60 MG capsule Take 1 capsule (60 mg total) by mouth daily.   . Exenatide ER (BYDUREON) 2 MG PEN Inject 2 mg into the skin once a week.   . fenofibrate 54 MG tablet TAKE 1 TABLET BY MOUTH A DAY AT SUPPERTIME   . fluticasone (FLONASE) 50 MCG/ACT nasal spray Place 2 sprays into both nostrils daily.   . hydrochlorothiazide (HYDRODIURIL)  12.5 MG tablet Take 1 tablet (12.5 mg total) by mouth daily.   . Insulin Glargine (BASAGLAR KWIKPEN) 100 UNIT/ML SOPN Inject 0.7 mLs (70 Units total) into the skin daily.   . Insulin Pen Needle (B-D UF III MINI PEN NEEDLES) 31G X 5 MM MISC Use as directed with insulin e11.65   . insulin regular (NOVOLIN R) 100 units/mL injection Patient to use Novolin R [place] TiD prior to meals per sliding scale instructions. Max daily dose is 45 units per day.   . INSULIN SYRINGE 1CC/29G 29G X 1/2" 1 ML MISC Insulin injections QID, use with lantus and regular insulin. DX E11.65   . Insulin Syringe-Needle U-100 (BD INSULIN SYRINGE U/F 1/2UNIT) 31G X 5/16" 0.3 ML MISC Indulin injection QID. Use with lantus and regular insulin Dx. 11.65   . NARCAN 4 MG/0.1ML LIQD Take 4 mg by mouth as needed (for opioid overdose).    . nitroGLYCERIN (NITROSTAT) 0.4 MG SL tablet Place 1 tablet (0.4 mg total) under the tongue every 5 (five) minutes x 3 doses as needed for chest pain.   [name] omega-3 acid ethyl esters (LOVAZA) 1 g capsule Take 1 capsule (1 g total) by mouth 2 (two) times daily.   . Oxcarbazepine (TRILEPTAL) 300 MG tablet Take 300-600 mg by mouth See admin instructions. Take 1  tablet (300MG ) by mouth every morning and 2 tablets (600MG ) by mouth every night   . oxyCODONE (OXY IR/ROXICODONE) 5 MG immediate release tablet Take 1 tablet (5 mg total) by mouth every 4 (four) hours as needed for moderate pain.   [name] oxyCODONE (OXYCONTIN) 10 mg 12 hr tablet Take 1 tablet (10 mg total) by mouth every 12 (twelve) hours.   [name] oxyCODONE (OXYCONTIN) 20 mg 12 hr tablet Take by mouth.   . pantoprazole (PROTONIX) 40 MG tablet TAKE 1 TABLET BY MOUTH A DAY FOR REFLUX   . polyethylene glycol (MIRALAX / GLYCOLAX) packet Take 17 g by mouth daily as needed. 06/04/2018: PRN  . pregabalin (LYRICA) 200 MG capsule Take 200 mg by mouth See admin instructions. Take 2 capsules (400MG ) by mouth every morning and 1 capsule (200MG ) by mouth every night   .  tamsulosin (FLOMAX) 0.4 MG CAPS capsule Take 1 capsule (0.4 mg total) by mouth daily after supper.   . [DISCONTINUED] amLODipine (NORVASC) 10 MG tablet Take 1 tablet (10 mg total) by mouth daily.   . [DISCONTINUED] benazepril (LOTENSIN) 20 MG tablet TAKE 1 TABLET BY MOUTH TWICE DAILY (Patient taking differently: Take 20 mg by mouth 2 (two) times daily. )   . [DISCONTINUED] clopidogrel (PLAVIX) 75 MG tablet Take 1 tablet (75 mg total) by mouth daily.   . [DISCONTINUED] fenofibrate 54 MG tablet TAKE 1 TABLET BY MOUTH A DAY AT SUPPERTIME (Patient taking differently: Take 54 mg by mouth daily with supper. )   . [DISCONTINUED] ibuprofen (ADVIL,MOTRIN) 400 MG tablet Take 400 mg by mouth every 6 (six) hours as needed (1-2 tablets prn).   . [DISCONTINUED] insulin glargine (LANTUS) 100 UNIT/ML injection Inject 0.7 mLs (70 Units total) into the skin daily.   . [DISCONTINUED] insulin regular (NOVOLIN R) 100 units/mL injection Patient to use Novolin R [place] TiD prior to meals per sliding scale instructions. Max daily dose is 45 units per day.   . [DISCONTINUED] INSULIN SYRINGE 1CC/29G 29G X 1/2" 1 ML MISC Insulin injections QID, use with lantus and regular insulin. DX E11.65   . [DISCONTINUED] methocarbamol (ROBAXIN) 500 MG tablet Take 1 tablet (500 mg total) by mouth every 8 (eight) hours as needed for muscle spasms. (Patient not taking: Reported on 06/04/2018)   . [DISCONTINUED] pantoprazole (PROTONIX) 40 MG tablet TAKE 1 TABLET BY MOUTH A DAY FOR REFLUX (Patient taking differently: Take 40 mg by mouth daily. )   . [DISCONTINUED] senna (SENOKOT) 8.6 MG TABS tablet Take 1 tablet (8.6 mg total) by mouth 2 (two) times daily. (Patient not taking: Reported on 06/04/2018)   . [DISCONTINUED] tamsulosin (FLOMAX) 0.4 MG CAPS capsule Take 1 capsule (0.4 mg total) by mouth daily after supper.    No facility-administered encounter medications on file as of 07/23/2018.     Surgical History: Past Surgical History:  Procedure  Laterality Date  . AMPUTATION TOE Left 04/19/2018   Procedure: AMPUTATION TOE-LEFT GREAT TOE;  Surgeon: Sharlotte Alamo, DPM;  Location: [facility];  Service: Podiatry;  Laterality: Left;  . CARDIAC CATHETERIZATION N/A 12/19/2014   Procedure: Coronary Stent Intervention;  Surgeon: Charolette Forward, MD;  Location: [facility];  Service: Cardiovascular;  Laterality: N/A;  . CARDIAC CATHETERIZATION Left 12/19/2014   Procedure: Left Heart Cath and Coronary Angiography;  Surgeon: Dionisio Diontre, MD;  Location: [facility];  Service: Cardiovascular;  Laterality: Left;  . IRRIGATION AND DEBRIDEMENT FOOT Left 03/02/2018   Procedure: IRRIGATION AND DEBRIDEMENT FOOT;  Surgeon: Samara Deist, DPM;  Location: [facility];  Service: Podiatry;  Laterality: Left;  . IRRIGATION AND DEBRIDEMENT FOOT Left 03/08/2018   Procedure: IRRIGATION AND DEBRIDEMENT FOOT AND BONE;  Surgeon: Sharlotte Alamo, DPM;  Location: [facility];  Service: Podiatry;  Laterality: Left;  . IRRIGATION AND DEBRIDEMENT FOOT Left 04/19/2018   Procedure: IRRIGATION AND DEBRIDEMENT FOOT;  Surgeon: Sharlotte Alamo, DPM;  Location: [facility];  Service: Podiatry;  Laterality: Left;  . LOWER EXTREMITY ANGIOGRAPHY Left 03/05/2018   Procedure: Lower Extremity Angiography;  Surgeon: Algernon Huxley, MD;  Location: [facility];  Service: Cardiovascular;  Laterality: Left;  . LOWER EXTREMITY ANGIOGRAPHY Left 03/08/2018   Procedure: LOWER EXTREMITY ANGIOGRAPHY;  Surgeon: Algernon Huxley, MD;  Location: [facility];  Service: Cardiovascular;  Laterality: Left;  . PACEMAKER INSERTION Left 11/02/2015   Procedure: INSERTION PACEMAKER;  Surgeon: Isaias Cowman, MD;  Location: [facility];  Service: Cardiovascular;  Laterality: Left;  . Pain Stimulator    . Right toe amputation      Medical History: Past Medical History:  Diagnosis Date  . Allergy   . Basal cell carcinoma    forehead  . BPH (benign prostatic hyperplasia)   . Chronic back pain   .  Depression   . Diabetes ([place])   . GERD (gastroesophageal reflux disease)   . Heart attack ([place])   . Hypertension   . Morbid obesity ([place])   . Obstructive sleep apnea   . 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 Status post insertion of spinal cord stimulator   . Stroke ([place])   . UTI (lower urinary tract infection)     Family History: Family History  Problem Relation Age of Onset  . Breast cancer Mother   . Cancer Mother   . Hypertension Mother   . Lung cancer Father   . Hypertension Father   . Heart disease Father   . Cancer Father   . Kidney disease Sister   . Prostate cancer Neg Hx    Depression screen [facility] 2/9 07/23/2018 07/05/2018 06/07/2018 04/11/2018 11/23/2017  Decreased Interest 0 0 1 0 3  Down, Depressed, Hopeless 0 1 1 0 3  PHQ - 2 Score 0 1 2 0 6  Altered sleeping - - 1 - 0  Tired, decreased energy - - 1 - 3  Change in appetite - - 0 - 3  Feeling bad or failure about yourself  - - 0 - 3  Trouble concentrating - - 1 - 3  Moving slowly or fidgety/restless - - 1 - 3  Suicidal thoughts - - 0 - 0  PHQ-9 Score - - 6 - 21  Difficult doing work/chores - - Somewhat difficult - Very difficult  Some recent data might be hidden    Functional Status Survey: Is the patient deaf or have difficulty hearing?: Yes Does the patient have difficulty seeing, even when wearing glasses/contacts?: No Does the patient have difficulty concentrating, remembering, or making decisions?: Yes Does the patient have difficulty walking or climbing stairs?: Yes Does the patient have difficulty dressing or bathing?: Yes Does the patient have difficulty doing errands alone such as visiting a doctor's office or shopping?: Yes  MMSE - Mini Mental State Exam 07/23/2018  Orientation to time 5  Orientation to Place 5  Registration 3  Attention/ Calculation 5  Recall 3  Language- name 2 objects 2  Language- repeat 1  Language- follow 3 step command 3  Language- read & follow direction  1  Write a sentence 1   Copy design 1  Total score 30    Fall Risk  07/23/2018 07/05/2018 06/07/2018 04/11/2018 11/23/2017  Falls in the past year? 1 1 1 1  No  Number falls in past yr: 1 1 1 1  -  Injury with Fall? 0 0 0 0 -  Risk Factor Category  - - - - -  Risk for fall due to : - - History of fall(s);Impaired balance/gait;Impaired mobility - -  Risk for fall due to: Comment - - - - -  Follow up - - Falls prevention discussed - -      Review of Systems  Constitutional: Negative.  Negative for chills, fatigue and unexpected weight change.  HENT: Negative.  Negative for congestion, rhinorrhea, sneezing and sore throat.   Eyes: Negative for redness.  Respiratory: Negative.  Negative for cough, chest tightness and shortness of breath.   Cardiovascular: Negative.  Negative for chest pain and palpitations.  Gastrointestinal: Negative.  Negative for abdominal pain, constipation, diarrhea, nausea and vomiting.  Endocrine: Negative.   Genitourinary: Negative.  Negative for dysuria and frequency.  Musculoskeletal: Negative.  Negative for arthralgias, back pain, joint swelling and neck pain.  Skin: Negative.  Negative for rash.  Allergic/Immunologic: Negative.   Neurological: Negative.  Negative for tremors and numbness.  Hematological: Negative for adenopathy. Does not bruise/bleed easily.  Psychiatric/Behavioral: Negative.  Negative for behavioral problems, sleep disturbance and suicidal ideas. The patient is not nervous/anxious.      Vital Signs: BP 126/88   Pulse 60   Resp 16   Ht 5' 8.75" (1.746 m)   Wt (!) 302 lb (137 kg)   SpO2 97%   BMI 44.92 kg/m    Physical Exam Vitals signs and nursing note reviewed.  Constitutional:      General: He is not in acute distress.    Appearance: He is well-developed. He is not diaphoretic.  HENT:     Head: Normocephalic and atraumatic.     Mouth/Throat:     Pharynx: No oropharyngeal exudate.  Eyes:     Pupils: Pupils are equal, round, and reactive to light.   Neck:     Musculoskeletal: Normal range of motion and neck supple.     Thyroid: No thyromegaly.     Vascular: No JVD.     Trachea: No tracheal deviation.  Cardiovascular:     Rate and Rhythm: Normal rate and regular rhythm.     Heart sounds: Normal heart sounds. No murmur. No friction rub. No gallop.   Pulmonary:     Effort: Pulmonary effort is normal. No respiratory distress.     Breath sounds: Normal breath sounds. No wheezing or rales.  Chest:     Chest wall: No tenderness.  Abdominal:     Palpations: Abdomen is soft.     Tenderness: There is no abdominal tenderness. There is no guarding.  Musculoskeletal: Normal range of motion.  Lymphadenopathy:     Cervical: No cervical adenopathy.  Skin:    General: Skin is warm and dry.  Neurological:     Mental Status: He is alert and oriented to person, place, and time.     Cranial Nerves: No cranial nerve deficit.  Psychiatric:        Behavior: Behavior normal.        Thought Content: Thought content normal.        Judgment: Judgment normal.      LABS: Recent Results (from the past 2160 hour(s))  Glucose, capillary     Status: Abnormal   Collection Time: 05/02/18  6:07 PM  Result Value Ref Range   Glucose-Capillary 234 (H) 70 - 99 mg/dL  CBC with Differential     Status: Abnormal   Collection Time: 05/02/18  6:11 PM  Result Value Ref Range   WBC 5.7 4.0 - 10.5 K/uL   RBC 4.74 4.22 - 5.81 MIL/uL   Hemoglobin 11.4 (L) 13.0 - 17.0 g/dL   HCT 36.7 (L) 39.0 - 52.0 %   MCV 77.4 (L) 80.0 - 100.0 fL   MCH 24.1 (L) 26.0 - 34.0 pg   MCHC 31.1 30.0 - 36.0 g/dL   RDW 15.7 (H) 11.5 - 15.5 %   Platelets 333 150 - 400 K/uL   nRBC 0.0 0.0 - 0.2 %   Neutrophils Relative % 45 %   Neutro Abs 2.6 1.7 - 7.7 K/uL   Lymphocytes Relative 40 %   Lymphs Abs 2.3 0.7 - 4.0 K/uL   Monocytes Relative 8 %   Monocytes Absolute 0.5 0.1 - 1.0 K/uL   Eosinophils Relative 5 %   Eosinophils Absolute 0.3 0.0 - 0.5 K/uL   Basophils Relative 2 %    Basophils Absolute 0.1 0.0 - 0.1 K/uL   Immature Granulocytes 0 %   Abs Immature Granulocytes 0.02 0.00 - 0.07 K/uL    Comment: Performed at [facility], [address] [place], [place] [address]  Comprehensive metabolic panel     Status: Abnormal   Collection Time: 05/02/18  6:11 PM  Result Value Ref Range   Sodium 138 135 - 145 mmol/L   Potassium 4.2 3.5 - 5.1 mmol/L   Chloride 100 98 - 111 mmol/L   CO2 31 22 - 32 mmol/L   Glucose, Bld 207 (H) 70 - 99 mg/dL   BUN 21 8 - 23 mg/dL   Creatinine, Ser 1.01 0.61 - 1.24 mg/dL   Calcium 9.4 8.9 - 10.3 mg/dL   Total Protein 7.6 6.5 - 8.1 g/dL   Albumin 4.0 3.5 - 5.0 g/dL   AST 17 15 - 41 U/L   ALT 15 0 - 44 U/L   Alkaline Phosphatase 61 38 - 126 U/L   Total Bilirubin 0.2 (L) 0.3 - 1.2 mg/dL   GFR calc non Af Amer >60 >60 mL/min   GFR calc Af Amer >60 >60 mL/min   Anion gap 7 5 - 15    Comment: Performed at [facility], [address] [place], [place] [address]  Glucose, capillary     Status: Abnormal   Collection Time: 05/02/18  9:00 PM  Result Value Ref Range   Glucose-Capillary 103 (H) 70 - 99 mg/dL  Aerobic Culture (superficial specimen)     Status: None   Collection Time: 05/15/18  2:20 PM  Result Value Ref Range   Specimen Description      FOOT Performed at [facility], [address] [place], [place] [address]    Special Requests      NONE Performed at [facility], [address] [place], [place] [address]    Gram Stain      RARE WBC PRESENT, PREDOMINANTLY PMN NO ORGANISMS SEEN Performed at [facility], [address] [place], [place] [address]    Culture      RARE CANDIDA LUSITANIAE RARE CANDIDA PARAPSILOSIS    Report Status 05/21/2018 FINAL   POCT HgB A1C     Status: Abnormal   Collection Time:  07/05/18  1:59 PM  Result Value Ref Range   Hemoglobin A1C 8.8 (A) 4.0 - 5.6 %   HbA1c POC (<> result, manual entry)     HbA1c, POC (prediabetic range)     HbA1c,  POC (controlled diabetic range)      Assessment/Plan: 1. Encounter for general adult medical examination with abnormal findings Pt is going to schedule eye exam, and sees podiatry regularly.  He is up to date on other PHM.  Will follow lab results when available.   - CBC with Differential/Platelet - Lipid Panel With LDL/HDL Ratio - TSH - T4, free - Comprehensive metabolic panel  2. Uncontrolled type 2 diabetes mellitus with hyperglycemia ([place]) Discussed dietary choices, as well as importance of taking medications as prescribed.    3. Essential hypertension Stable, continue present management.  4. Depression, recurrent ([place]) Stable, pt is hopefull that he will gain more independence when his boots come off.  5. OSA on CPAP Stable, pt reports good compliance with cpap.  Denies any issues.  Will continue as directed.  6. Class 3 severe obesity due to excess calories with serious comorbidity and body mass index (BMI) of 40.0 to 44.9 in adult [facility]) Obesity Counseling: Risk Assessment: An assessment of behavioral risk factors was made today and includes lack of exercise sedentary lifestyle, lack of portion control and poor dietary habits.  Risk Modification Advice: She was counseled on portion control guidelines. Restricting daily caloric intake to. . The detrimental long term effects of obesity on her health and ongoing poor compliance was also discussed with the patient.  7. Diabetic foot infection ([place]) Followed by Podiatry, continue to see Dr. Caryl Comes as scheduled.   8. Mixed hyperlipidemia Will check lipid panel and follow up as needed  9. LVH (left ventricular hypertrophy) due to hypertensive disease, with heart failure ([place]) Stable,Continue to follow up with Cardiology as scheduled.   General Counseling: myshawn chiriboga understanding of the findings of todays visit and agrees with plan of treatment. I have discussed any further diagnostic evaluation that may be needed or  ordered today. We also reviewed his medications today. he has been encouraged to call the office with any questions or concerns that should arise related to todays visit.   Orders Placed This Encounter  Procedures  . CBC with Differential/Platelet  . Lipid Panel With LDL/HDL Ratio  . TSH  . T4, free  . Comprehensive metabolic panel    No orders of the defined types were placed in this encounter.   Time spent:35 Minutes   This patient was seen by Orson Gear AGNP-C in Collaboration with Dr Lavera Guise as a part of collaborative care agreement    Kendell Bane AGNP-C Internal Medicine

## 2018-07-26 DIAGNOSIS — M7989 Other specified soft tissue disorders: Secondary | ICD-10-CM | POA: Diagnosis not present

## 2018-07-26 DIAGNOSIS — I10 Essential (primary) hypertension: Secondary | ICD-10-CM | POA: Diagnosis not present

## 2018-07-26 DIAGNOSIS — L97511 Non-pressure chronic ulcer of other part of right foot limited to breakdown of skin: Secondary | ICD-10-CM | POA: Diagnosis not present

## 2018-07-26 DIAGNOSIS — I2510 Atherosclerotic heart disease of native coronary artery without angina pectoris: Secondary | ICD-10-CM | POA: Diagnosis not present

## 2018-07-26 DIAGNOSIS — E782 Mixed hyperlipidemia: Secondary | ICD-10-CM | POA: Diagnosis not present

## 2018-07-26 DIAGNOSIS — R001 Bradycardia, unspecified: Secondary | ICD-10-CM | POA: Diagnosis not present

## 2018-07-26 DIAGNOSIS — I5032 Chronic diastolic (congestive) heart failure: Secondary | ICD-10-CM | POA: Diagnosis not present

## 2018-07-30 DIAGNOSIS — Z89411 Acquired absence of right great toe: Secondary | ICD-10-CM | POA: Diagnosis not present

## 2018-07-30 DIAGNOSIS — G8929 Other chronic pain: Secondary | ICD-10-CM | POA: Diagnosis not present

## 2018-07-30 DIAGNOSIS — Z7902 Long term (current) use of antithrombotics/antiplatelets: Secondary | ICD-10-CM | POA: Diagnosis not present

## 2018-07-30 DIAGNOSIS — Z79891 Long term (current) use of opiate analgesic: Secondary | ICD-10-CM | POA: Diagnosis not present

## 2018-07-30 DIAGNOSIS — L97511 Non-pressure chronic ulcer of other part of right foot limited to breakdown of skin: Secondary | ICD-10-CM | POA: Diagnosis not present

## 2018-07-30 DIAGNOSIS — E11621 Type 2 diabetes mellitus with foot ulcer: Secondary | ICD-10-CM | POA: Diagnosis not present

## 2018-07-30 DIAGNOSIS — Z794 Long term (current) use of insulin: Secondary | ICD-10-CM | POA: Diagnosis not present

## 2018-07-30 DIAGNOSIS — E1151 Type 2 diabetes mellitus with diabetic peripheral angiopathy without gangrene: Secondary | ICD-10-CM | POA: Diagnosis not present

## 2018-07-30 DIAGNOSIS — E1140 Type 2 diabetes mellitus with diabetic neuropathy, unspecified: Secondary | ICD-10-CM | POA: Diagnosis not present

## 2018-08-01 ENCOUNTER — Telehealth: Payer: Self-pay

## 2018-08-01 NOTE — Telephone Encounter (Signed)
Called pt informed pt that his bydureon is here and ready for pick up.

## 2018-08-06 DIAGNOSIS — E11621 Type 2 diabetes mellitus with foot ulcer: Secondary | ICD-10-CM | POA: Diagnosis not present

## 2018-08-06 DIAGNOSIS — E1151 Type 2 diabetes mellitus with diabetic peripheral angiopathy without gangrene: Secondary | ICD-10-CM | POA: Diagnosis not present

## 2018-08-06 DIAGNOSIS — G8929 Other chronic pain: Secondary | ICD-10-CM | POA: Diagnosis not present

## 2018-08-06 DIAGNOSIS — Z794 Long term (current) use of insulin: Secondary | ICD-10-CM | POA: Diagnosis not present

## 2018-08-06 DIAGNOSIS — Z79891 Long term (current) use of opiate analgesic: Secondary | ICD-10-CM | POA: Diagnosis not present

## 2018-08-06 DIAGNOSIS — L97511 Non-pressure chronic ulcer of other part of right foot limited to breakdown of skin: Secondary | ICD-10-CM | POA: Diagnosis not present

## 2018-08-06 DIAGNOSIS — E1140 Type 2 diabetes mellitus with diabetic neuropathy, unspecified: Secondary | ICD-10-CM | POA: Diagnosis not present

## 2018-08-06 DIAGNOSIS — Z89411 Acquired absence of right great toe: Secondary | ICD-10-CM | POA: Diagnosis not present

## 2018-08-06 DIAGNOSIS — Z7902 Long term (current) use of antithrombotics/antiplatelets: Secondary | ICD-10-CM | POA: Diagnosis not present

## 2018-08-09 ENCOUNTER — Other Ambulatory Visit: Payer: Self-pay

## 2018-08-09 MED ORDER — HYDROCHLOROTHIAZIDE 12.5 MG PO TABS: 12.5000 mg | ORAL_TABLET | Freq: Every day | ORAL | 5 refills | Status: DC

## 2018-08-09 MED ORDER — GLUCOSE BLOOD VI STRP: ORAL_STRIP | 3 refills | Status: DC

## 2018-08-13 ENCOUNTER — Other Ambulatory Visit: Payer: Self-pay

## 2018-08-13 DIAGNOSIS — L97511 Non-pressure chronic ulcer of other part of right foot limited to breakdown of skin: Secondary | ICD-10-CM | POA: Diagnosis not present

## 2018-08-13 DIAGNOSIS — E1140 Type 2 diabetes mellitus with diabetic neuropathy, unspecified: Secondary | ICD-10-CM | POA: Diagnosis not present

## 2018-08-13 DIAGNOSIS — E11621 Type 2 diabetes mellitus with foot ulcer: Secondary | ICD-10-CM | POA: Diagnosis not present

## 2018-08-13 DIAGNOSIS — Z7902 Long term (current) use of antithrombotics/antiplatelets: Secondary | ICD-10-CM | POA: Diagnosis not present

## 2018-08-13 DIAGNOSIS — E1151 Type 2 diabetes mellitus with diabetic peripheral angiopathy without gangrene: Secondary | ICD-10-CM | POA: Diagnosis not present

## 2018-08-13 DIAGNOSIS — Z89411 Acquired absence of right great toe: Secondary | ICD-10-CM | POA: Diagnosis not present

## 2018-08-13 DIAGNOSIS — G8929 Other chronic pain: Secondary | ICD-10-CM | POA: Diagnosis not present

## 2018-08-13 DIAGNOSIS — Z79891 Long term (current) use of opiate analgesic: Secondary | ICD-10-CM | POA: Diagnosis not present

## 2018-08-13 DIAGNOSIS — Z794 Long term (current) use of insulin: Secondary | ICD-10-CM | POA: Diagnosis not present

## 2018-08-13 MED ORDER — GLUCOSE BLOOD VI STRP: ORAL_STRIP | 3 refills | Status: DC

## 2018-08-17 ENCOUNTER — Other Ambulatory Visit: Payer: Self-pay

## 2018-08-20 DIAGNOSIS — I2510 Atherosclerotic heart disease of native coronary artery without angina pectoris: Secondary | ICD-10-CM | POA: Diagnosis not present

## 2018-08-20 DIAGNOSIS — M7989 Other specified soft tissue disorders: Secondary | ICD-10-CM | POA: Diagnosis not present

## 2018-08-20 DIAGNOSIS — R600 Localized edema: Secondary | ICD-10-CM | POA: Insufficient documentation

## 2018-08-20 DIAGNOSIS — I110 Hypertensive heart disease with heart failure: Secondary | ICD-10-CM | POA: Diagnosis not present

## 2018-08-20 DIAGNOSIS — I5032 Chronic diastolic (congestive) heart failure: Secondary | ICD-10-CM | POA: Diagnosis not present

## 2018-08-23 ENCOUNTER — Encounter: Payer: Self-pay | Admitting: Podiatry

## 2018-08-27 DIAGNOSIS — L97511 Non-pressure chronic ulcer of other part of right foot limited to breakdown of skin: Secondary | ICD-10-CM | POA: Diagnosis not present

## 2018-08-27 DIAGNOSIS — L851 Acquired keratosis [keratoderma] palmaris et plantaris: Secondary | ICD-10-CM | POA: Diagnosis not present

## 2018-08-27 DIAGNOSIS — E1151 Type 2 diabetes mellitus with diabetic peripheral angiopathy without gangrene: Secondary | ICD-10-CM | POA: Diagnosis not present

## 2018-08-27 DIAGNOSIS — B351 Tinea unguium: Secondary | ICD-10-CM | POA: Diagnosis not present

## 2018-09-06 ENCOUNTER — Other Ambulatory Visit: Payer: Self-pay | Admitting: Adult Health

## 2018-09-07 ENCOUNTER — Encounter (INDEPENDENT_AMBULATORY_CARE_PROVIDER_SITE_OTHER): Payer: Medicare HMO

## 2018-09-07 ENCOUNTER — Ambulatory Visit (INDEPENDENT_AMBULATORY_CARE_PROVIDER_SITE_OTHER): Payer: Medicare HMO | Admitting: Vascular Surgery

## 2018-09-07 ENCOUNTER — Other Ambulatory Visit: Payer: Self-pay | Admitting: Nurse Practitioner

## 2018-09-07 ENCOUNTER — Other Ambulatory Visit: Payer: Self-pay | Admitting: Adult Health

## 2018-09-07 DIAGNOSIS — E782 Mixed hyperlipidemia: Secondary | ICD-10-CM

## 2018-09-07 MED ORDER — PANTOPRAZOLE SODIUM 40 MG PO TBEC: DELAYED_RELEASE_TABLET | ORAL | 3 refills | Status: DC

## 2018-09-07 MED ORDER — OMEGA-3-ACID ETHYL ESTERS 1 G PO CAPS: 1.0000 g | ORAL_CAPSULE | Freq: Two times a day (BID) | ORAL | 5 refills | Status: DC

## 2018-09-07 MED ORDER — TAMSULOSIN HCL 0.4 MG PO CAPS: 0.4000 mg | ORAL_CAPSULE | Freq: Every day | ORAL | 3 refills | Status: DC

## 2018-09-25 ENCOUNTER — Encounter (INDEPENDENT_AMBULATORY_CARE_PROVIDER_SITE_OTHER): Payer: Medicare HMO

## 2018-09-25 ENCOUNTER — Ambulatory Visit (INDEPENDENT_AMBULATORY_CARE_PROVIDER_SITE_OTHER): Payer: Medicare HMO | Admitting: Nurse Practitioner

## 2018-10-01 ENCOUNTER — Other Ambulatory Visit: Payer: Self-pay

## 2018-10-01 MED ORDER — BENAZEPRIL HCL 20 MG PO TABS: 20.0000 mg | ORAL_TABLET | Freq: Two times a day (BID) | ORAL | 2 refills | Status: DC

## 2018-10-05 ENCOUNTER — Ambulatory Visit (INDEPENDENT_AMBULATORY_CARE_PROVIDER_SITE_OTHER): Payer: Medicare HMO | Admitting: Nurse Practitioner

## 2018-10-05 ENCOUNTER — Emergency Department
Admission: EM | Admit: 2018-10-05 | Discharge: 2018-10-06 | Disposition: A | Discharge status: Home / Self Care | Payer: Medicare HMO | Attending: Emergency Medicine | Admitting: Emergency Medicine

## 2018-10-05 ENCOUNTER — Emergency Department: Payer: Medicare HMO

## 2018-10-05 ENCOUNTER — Encounter: Payer: Self-pay | Admitting: Nurse Practitioner

## 2018-10-05 ENCOUNTER — Other Ambulatory Visit: Payer: Self-pay

## 2018-10-05 VITALS — Ht 68.75 in | Wt 300.0 lb

## 2018-10-05 DIAGNOSIS — Z8673 Personal history of transient ischemic attack (TIA), and cerebral infarction without residual deficits: Secondary | ICD-10-CM | POA: Insufficient documentation

## 2018-10-05 DIAGNOSIS — E119 Type 2 diabetes mellitus without complications: Secondary | ICD-10-CM | POA: Diagnosis not present

## 2018-10-05 DIAGNOSIS — R0902 Hypoxemia: Secondary | ICD-10-CM | POA: Diagnosis not present

## 2018-10-05 DIAGNOSIS — Z7902 Long term (current) use of antithrombotics/antiplatelets: Secondary | ICD-10-CM | POA: Diagnosis not present

## 2018-10-05 DIAGNOSIS — I2583 Coronary atherosclerosis due to lipid rich plaque: Secondary | ICD-10-CM

## 2018-10-05 DIAGNOSIS — I252 Old myocardial infarction: Secondary | ICD-10-CM | POA: Insufficient documentation

## 2018-10-05 DIAGNOSIS — Z7982 Long term (current) use of aspirin: Secondary | ICD-10-CM | POA: Insufficient documentation

## 2018-10-05 DIAGNOSIS — Y929 Unspecified place or not applicable: Secondary | ICD-10-CM | POA: Diagnosis not present

## 2018-10-05 DIAGNOSIS — Y999 Unspecified external cause status: Secondary | ICD-10-CM | POA: Diagnosis not present

## 2018-10-05 DIAGNOSIS — Y939 Activity, unspecified: Secondary | ICD-10-CM | POA: Diagnosis not present

## 2018-10-05 DIAGNOSIS — I5032 Chronic diastolic (congestive) heart failure: Secondary | ICD-10-CM | POA: Insufficient documentation

## 2018-10-05 DIAGNOSIS — W010XXA Fall on same level from slipping, tripping and stumbling without subsequent striking against object, initial encounter: Secondary | ICD-10-CM | POA: Diagnosis not present

## 2018-10-05 DIAGNOSIS — I10 Essential (primary) hypertension: Secondary | ICD-10-CM | POA: Diagnosis not present

## 2018-10-05 DIAGNOSIS — I2510 Atherosclerotic heart disease of native coronary artery without angina pectoris: Secondary | ICD-10-CM

## 2018-10-05 DIAGNOSIS — W19XXXA Unspecified fall, initial encounter: Secondary | ICD-10-CM

## 2018-10-05 DIAGNOSIS — S0990XA Unspecified injury of head, initial encounter: Secondary | ICD-10-CM

## 2018-10-05 DIAGNOSIS — E1165 Type 2 diabetes mellitus with hyperglycemia: Secondary | ICD-10-CM | POA: Diagnosis not present

## 2018-10-05 DIAGNOSIS — Z79899 Other long term (current) drug therapy: Secondary | ICD-10-CM | POA: Diagnosis not present

## 2018-10-05 DIAGNOSIS — Z87891 Personal history of nicotine dependence: Secondary | ICD-10-CM | POA: Diagnosis not present

## 2018-10-05 DIAGNOSIS — E782 Mixed hyperlipidemia: Secondary | ICD-10-CM | POA: Diagnosis not present

## 2018-10-05 DIAGNOSIS — Z794 Long term (current) use of insulin: Secondary | ICD-10-CM | POA: Diagnosis not present

## 2018-10-05 DIAGNOSIS — I110 Hypertensive heart disease with heart failure: Secondary | ICD-10-CM | POA: Diagnosis not present

## 2018-10-05 DIAGNOSIS — S199XXA Unspecified injury of neck, initial encounter: Secondary | ICD-10-CM | POA: Diagnosis not present

## 2018-10-05 DIAGNOSIS — S0012XA Contusion of left eyelid and periocular area, initial encounter: Secondary | ICD-10-CM | POA: Insufficient documentation

## 2018-10-05 DIAGNOSIS — R531 Weakness: Secondary | ICD-10-CM | POA: Diagnosis not present

## 2018-10-05 DIAGNOSIS — S0083XA Contusion of other part of head, initial encounter: Secondary | ICD-10-CM | POA: Diagnosis not present

## 2018-10-05 LAB — PROTIME-INR
INR: 0.9 (ref 0.8–1.2)
Prothrombin Time: 12.1 seconds (ref 11.4–15.2)

## 2018-10-05 NOTE — Progress Notes (Signed)
[facility] [place], [place] [address]  Internal MEDICINE  Telephone Visit  Patient Name: Victor Wise  [id number]  [id number]  Date of Service: 10/21/2018  I connected with the patient at 4:16pm by telephone and verified the patients identity using two identifiers.   I discussed the limitations, risks, security and privacy concerns of performing an evaluation and management service by telephone and the availability of in person appointments. I also discussed with the patient that there may be a patient responsible charge related to the service.  The patient expressed understanding and agrees to proceed.    Chief Complaint  Patient presents with  . Telephone Screen    VIDEO VISIT [phone]   . Telephone Assessment  . Medical Management of Chronic Issues    3 month follow up  . Diabetes  . Hypertension  . Gastroesophageal Reflux    The patient has been contacted via telephone for follow up visit due to concerns for spread of novel coronavirus. He states that he has been doing well. He states that blood pressure is doing well. He is unsure of blood sugars as he does not check them regularly. He goes to podiatrist often as he has significant history of diabetic foot ulcers, one which resulted in partial amputation of a toe. Currently, he states that he is feeling pretty well.       Current Medication: Outpatient Encounter Medications as of 10/05/2018  Medication Sig Note  . acetaminophen (TYLENOL) 325 MG tablet Take 2 tablets (650 mg total) by mouth every 6 (six) hours as needed for mild pain (or Fever >/= 101). 06/04/2018: PRN  . amLODipine (NORVASC) 10 MG tablet TAKE 1 TABLET BY MOUTH ONCE DAILY (Patient taking differently: Take 10 mg by mouth daily. )   . aspirin EC 81 MG tablet Take 81 mg by mouth daily.   . benazepril (LOTENSIN) 20 MG tablet Take 1 tablet (20 mg total) by mouth 2 (two) times daily.   . clopidogrel (PLAVIX) 75 MG tablet Take 1 tablet  (75 mg total) by mouth daily.   . DULoxetine (CYMBALTA) 60 MG capsule Take 1 capsule (60 mg total) by mouth daily.   . Exenatide ER (BYDUREON) 2 MG PEN Inject 2 mg into the skin once a week. 10/06/2018: Every Friday  . fenofibrate 54 MG tablet TAKE 1 TABLET BY MOUTH A DAY AT SUPPERTIME   . fluticasone (FLONASE) 50 MCG/ACT nasal spray Place 2 sprays into both nostrils daily.   [name] glucose blood (TRUE METRIX BLOOD GLUCOSE TEST) test strip Use as instructed three times a day diag e11.65   . hydrochlorothiazide (HYDRODIURIL) 12.5 MG tablet Take 1 tablet (12.5 mg total) by mouth daily.   . Insulin Pen Needle (B-D UF III MINI PEN NEEDLES) 31G X 5 MM MISC Use as directed with insulin e11.65   . insulin regular (NOVOLIN R) 100 units/mL injection Patient to use Novolin R [place] TiD prior to meals per sliding scale instructions. Max daily dose is 45 units per day.   . INSULIN SYRINGE 1CC/29G 29G X 1/2" 1 ML MISC Insulin injections QID, use with lantus and regular insulin. DX E11.65   . Insulin Syringe-Needle U-100 (BD INSULIN SYRINGE U/F 1/2UNIT) 31G X 5/16" 0.3 ML MISC Indulin injection QID. Use with lantus and regular insulin Dx. 11.65   . NARCAN 4 MG/0.1ML LIQD Take 4 mg by mouth as needed (for opioid overdose).    . nitroGLYCERIN (NITROSTAT) 0.4 MG SL tablet Place 1  tablet (0.4 mg total) under the tongue every 5 (five) minutes x 3 doses as needed for chest pain.   . Oxcarbazepine (TRILEPTAL) 300 MG tablet Take 300-600 mg by mouth See admin instructions. Take 1 tablet (300MG ) by mouth every morning and 2 tablets (600MG ) by mouth every night   . oxyCODONE (OXY IR/ROXICODONE) 5 MG immediate release tablet Take 1 tablet (5 mg total) by mouth every 4 (four) hours as needed for moderate pain.   [name] oxyCODONE (OXYCONTIN) 10 mg 12 hr tablet Take 1 tablet (10 mg total) by mouth every 12 (twelve) hours.   [name] oxyCODONE (OXYCONTIN) 20 mg 12 hr tablet Take by mouth.   . pantoprazole (PROTONIX) 40 MG tablet TAKE 1 TABLET BY  MOUTH A DAY FOR REFLUX   . polyethylene glycol (MIRALAX / GLYCOLAX) packet Take 17 g by mouth daily as needed. 06/04/2018: PRN  . pregabalin (LYRICA) 200 MG capsule Take 200 mg by mouth See admin instructions. Take 2 capsules (400MG ) by mouth every morning and 1 capsule (200MG ) by mouth every night   . tamsulosin (FLOMAX) 0.4 MG CAPS capsule Take 1 capsule (0.4 mg total) by mouth daily after supper.   . [DISCONTINUED] Insulin Glargine (BASAGLAR KWIKPEN) 100 UNIT/ML SOPN Inject 0.7 mLs (70 Units total) into the skin daily.   . [DISCONTINUED] omega-3 acid ethyl esters (LOVAZA) 1 g capsule Take 1 capsule (1 g total) by mouth 2 (two) times daily.    No facility-administered encounter medications on file as of 10/05/2018.     Surgical History: Past Surgical History:  Procedure Laterality Date  . AMPUTATION TOE Left 04/19/2018   Procedure: AMPUTATION TOE-LEFT GREAT TOE;  Surgeon: Sharlotte Alamo, DPM;  Location: [facility];  Service: Podiatry;  Laterality: Left;  . CARDIAC CATHETERIZATION N/A 12/19/2014   Procedure: Coronary Stent Intervention;  Surgeon: Charolette Forward, MD;  Location: [facility];  Service: Cardiovascular;  Laterality: N/A;  . CARDIAC CATHETERIZATION Left 12/19/2014   Procedure: Left Heart Cath and Coronary Angiography;  Surgeon: Dionisio Giordan, MD;  Location: [facility];  Service: Cardiovascular;  Laterality: Left;  . IRRIGATION AND DEBRIDEMENT FOOT Left 03/02/2018   Procedure: IRRIGATION AND DEBRIDEMENT FOOT;  Surgeon: Samara Deist, DPM;  Location: [facility];  Service: Podiatry;  Laterality: Left;  . IRRIGATION AND DEBRIDEMENT FOOT Left 03/08/2018   Procedure: IRRIGATION AND DEBRIDEMENT FOOT AND BONE;  Surgeon: Sharlotte Alamo, DPM;  Location: [facility];  Service: Podiatry;  Laterality: Left;  . IRRIGATION AND DEBRIDEMENT FOOT Left 04/19/2018   Procedure: IRRIGATION AND DEBRIDEMENT FOOT;  Surgeon: Sharlotte Alamo, DPM;  Location: [facility];  Service: Podiatry;  Laterality: Left;  .  LOWER EXTREMITY ANGIOGRAPHY Left 03/05/2018   Procedure: Lower Extremity Angiography;  Surgeon: Algernon Huxley, MD;  Location: [place] [facility];  Service: Cardiovascular;  Laterality: Left;  . LOWER EXTREMITY ANGIOGRAPHY Left 03/08/2018   Procedure: LOWER EXTREMITY ANGIOGRAPHY;  Surgeon: Algernon Huxley, MD;  Location: [facility];  Service: Cardiovascular;  Laterality: Left;  . PACEMAKER INSERTION Left 11/02/2015   Procedure: INSERTION PACEMAKER;  Surgeon: Isaias Cowman, MD;  Location: [facility];  Service: Cardiovascular;  Laterality: Left;  . Pain Stimulator    . Right toe amputation      Medical History: Past Medical History:  Diagnosis Date  . Allergy   . Basal cell carcinoma    forehead  . BPH (benign prostatic hyperplasia)   . Chronic back pain   . Depression   . Diabetes (  HCC)   . GERD (gastroesophageal reflux disease)   . Heart attack ([place])   . Hypertension   . Morbid obesity ([place])   . Obstructive sleep apnea   . 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 Status post insertion of spinal cord stimulator   . Stroke ([address])   . UTI (lower urinary tract infection)     Family History: Family History  Problem Relation Age of Onset  . Breast cancer Mother   . Cancer Mother   . Hypertension Mother   . Lung cancer Father   . Hypertension Father   . Heart disease Father   . Cancer Father   . Kidney disease Sister   . Prostate cancer Neg Hx     Social History   Socioeconomic History  . Marital status: Divorced    Spouse name: Not on file  . Number of children: Not on file  . Years of education: Not on file  . Highest education level: Not on file  Occupational History  . Not on file  Social Needs  . Financial resource strain: Not on file  . Food insecurity:    Worry: Not on file    Inability: Not on file  . Transportation needs:    Medical: Not on file    Non-medical: Not on file  Tobacco Use  . Smoking status: Former Smoker    Types: Cigarettes  .  Smokeless tobacco: Never Used  . Tobacco comment: quit 45 years  Substance and Sexual Activity  . Alcohol use: Not Currently    Alcohol/week: 0.0 standard drinks    Comment: have not had alcohol in 16yrs  . Drug use: No  . Sexual activity: Not on file  Lifestyle  . Physical activity:    Days per week: Not on file    Minutes per session: Not on file  . Stress: Not on file  Relationships  . Social connections:    Talks on phone: Not on file    Gets together: Not on file    Attends religious service: Not on file    Active member of club or organization: Not on file    Attends meetings of clubs or organizations: Not on file    Relationship status: Not on file  . Intimate partner violence:    Fear of current or ex partner: Not on file    Emotionally abused: Not on file    Physically abused: Not on file    Forced sexual activity: Not on file  Other Topics Concern  . Not on file  Social History Narrative  . Not on file      Review of Systems  Constitutional: Positive for fatigue. Negative for chills and unexpected weight change.  HENT: Negative for congestion, rhinorrhea, sneezing and sore throat.   Respiratory: Negative for cough, chest tightness, shortness of breath and wheezing.   Cardiovascular: Negative for chest pain and palpitations.  Gastrointestinal: Negative for abdominal pain, constipation, diarrhea, nausea and vomiting.  Endocrine: Negative for cold intolerance, heat intolerance, polydipsia and polyuria.  Musculoskeletal: Positive for gait problem. Negative for arthralgias, back pain, joint swelling and neck pain.  Skin: Negative for rash.  Neurological: Positive for weakness. Negative for tremors and numbness.  Hematological: Negative for adenopathy. Does not bruise/bleed easily.  Psychiatric/Behavioral: Positive for dysphoric mood. Negative for behavioral problems, sleep disturbance and suicidal ideas. The patient is nervous/anxious.    Today's Vitals   10/05/18  1535  Weight: 300 lb (136.1 kg)  Height: 5'  8.75" (1.746 m)   Body mass index is 44.63 kg/m.  Observation/Objective:  The patient is alert and oriented. He is pleasant and answering all questions appropriately. Breathing is non-labored. He is in no acute distress.    Assessment/Plan: 1. Essential hypertension Stable. Continue BP medications as prescribed   2. Uncontrolled type 2 diabetes mellitus with hyperglycemia (HCC) No changes made in diabetic medication today. Will check HgbA1c at next in-office visit. Advised patient to monitor blood sugars closely and take proper doses of insulin   3. Coronary artery disease due to lipid rich plaque Continue regular visits with cardiology as scheduled.   4. Mixed hyperlipidemia Continue fenofibrate as prescribed   General Counseling: oliver heitzenrater understanding of the findings of today's phone visit and agrees with plan of treatment. I have discussed any further diagnostic evaluation that may be needed or ordered today. We also reviewed his medications today. he has been encouraged to call the office with any questions or concerns that should arise related to todays visit.   Cardiac risk factor modification:  1. Control blood pressure. 2. Exercise as prescribed. 3. Follow low sodium, low fat diet. and low fat and low cholestrol diet. 4. Take ASA 81mg  once a day. 5. Restricted calories diet to lose weight.  This patient was seen by Leretha Pol FNP Collaboration with Dr Lavera Guise as a part of collaborative care agreement  Time spent: 25 Minutes    Dr Lavera Guise Internal medicine

## 2018-10-05 NOTE — ED Triage Notes (Signed)
Pt arrives to ED from home via [facility] with c/c of mechanical fall resulting in impact to left side of face. Pt denies any LoC or visual changes. Pt states he tripped and fell forward. The corner of the television impacted just below his left eye. Pt was not able to get up and was found by his daughter who called EMS. EMS reports pt A&Ox4, BP 152/100, p 86, O2 sat 97% on room air, CBG 268. Pt took 6 units of insulin just before coming with EMS. Upon arrival, pt A&Ox4, NAD, no respiratory distress noted. Pt has dried blood below left eye and beginnings of a bruise.

## 2018-10-06 DIAGNOSIS — S0083XA Contusion of other part of head, initial encounter: Secondary | ICD-10-CM | POA: Diagnosis not present

## 2018-10-06 DIAGNOSIS — S0990XA Unspecified injury of head, initial encounter: Secondary | ICD-10-CM | POA: Diagnosis not present

## 2018-10-06 DIAGNOSIS — S199XXA Unspecified injury of neck, initial encounter: Secondary | ICD-10-CM | POA: Diagnosis not present

## 2018-10-06 LAB — URINALYSIS, COMPLETE (UACMP) WITH MICROSCOPIC
Bacteria, UA: NONE SEEN
Bilirubin Urine: NEGATIVE
Glucose, UA: NEGATIVE mg/dL
Hgb urine dipstick: NEGATIVE
Ketones, ur: NEGATIVE mg/dL
Nitrite: NEGATIVE
Protein, ur: NEGATIVE mg/dL
Specific Gravity, Urine: 1.012 (ref 1.005–1.030)
pH: 5 (ref 5.0–8.0)

## 2018-10-06 LAB — CBC WITH DIFFERENTIAL/PLATELET
Abs Immature Granulocytes: 0.06 10*3/uL (ref 0.00–0.07)
Basophils Absolute: 0.1 10*3/uL (ref 0.0–0.1)
Basophils Relative: 2 %
Eosinophils Absolute: 0.2 10*3/uL (ref 0.0–0.5)
Eosinophils Relative: 3 %
HCT: 37.4 % — ABNORMAL LOW (ref 39.0–52.0)
Hemoglobin: 11.8 g/dL — ABNORMAL LOW (ref 13.0–17.0)
Immature Granulocytes: 1 %
Lymphocytes Relative: 28 %
Lymphs Abs: 1.7 10*3/uL (ref 0.7–4.0)
MCH: 23.8 pg — ABNORMAL LOW (ref 26.0–34.0)
MCHC: 31.6 g/dL (ref 30.0–36.0)
MCV: 75.6 fL — ABNORMAL LOW (ref 80.0–100.0)
Monocytes Absolute: 0.4 10*3/uL (ref 0.1–1.0)
Monocytes Relative: 7 %
Neutro Abs: 3.5 10*3/uL (ref 1.7–7.7)
Neutrophils Relative %: 59 %
Platelets: 266 10*3/uL (ref 150–400)
RBC: 4.95 MIL/uL (ref 4.22–5.81)
RDW: 19.4 % — ABNORMAL HIGH (ref 11.5–15.5)
WBC: 5.9 10*3/uL (ref 4.0–10.5)
nRBC: 0 % (ref 0.0–0.2)

## 2018-10-06 LAB — COMPREHENSIVE METABOLIC PANEL
ALT: 16 U/L (ref 0–44)
AST: 20 U/L (ref 15–41)
Albumin: 4 g/dL (ref 3.5–5.0)
Alkaline Phosphatase: 67 U/L (ref 38–126)
Anion gap: 10 (ref 5–15)
BUN: 30 mg/dL — ABNORMAL HIGH (ref 8–23)
CO2: 31 mmol/L (ref 22–32)
Calcium: 9 mg/dL (ref 8.9–10.3)
Chloride: 98 mmol/L (ref 98–111)
Creatinine, Ser: 1.25 mg/dL — ABNORMAL HIGH (ref 0.61–1.24)
GFR calc Af Amer: >60 mL/min (ref >60)
GFR calc non Af Amer: 60 mL/min — ABNORMAL LOW (ref >60)
Glucose, Bld: 245 mg/dL — ABNORMAL HIGH (ref 70–99)
Potassium: 4 mmol/L (ref 3.5–5.1)
Sodium: 139 mmol/L (ref 135–145)
Total Bilirubin: 0.3 mg/dL (ref 0.3–1.2)
Total Protein: 7.9 g/dL (ref 6.5–8.1)

## 2018-10-06 LAB — GLUCOSE, CAPILLARY
Glucose-Capillary: 64 mg/dL — ABNORMAL LOW (ref 70–99)
Glucose-Capillary: 102 mg/dL — ABNORMAL HIGH (ref 70–99)
Glucose-Capillary: 125 mg/dL — ABNORMAL HIGH (ref 70–99)

## 2018-10-06 LAB — TROPONIN I: Troponin I: <0.03 ng/mL (ref <0.03)

## 2018-10-06 LAB — ETHANOL: Alcohol, Ethyl (B): <10 mg/dL (ref <10)

## 2018-10-06 LAB — CK: Total CK: 47 U/L — ABNORMAL LOW (ref 49–397)

## 2018-10-06 MED ORDER — FUROSEMIDE 40 MG PO TABS
20.0000 mg | ORAL_TABLET | Freq: Every day | ORAL | Status: DC
  Administered 2018-10-06: 20 mg via ORAL
  Filled 2018-10-06: qty 1

## 2018-10-06 MED ORDER — BENAZEPRIL HCL 20 MG PO TABS
20.0000 mg | ORAL_TABLET | Freq: Two times a day (BID) | ORAL | Status: DC
  Administered 2018-10-06: 20 mg via ORAL
  Filled 2018-10-06 (×2): qty 1

## 2018-10-06 MED ORDER — INSULIN ASPART 100 UNIT/ML ~~LOC~~ SOLN: 0.0000 [IU] | Freq: Every day | SUBCUTANEOUS | Status: DC

## 2018-10-06 MED ORDER — BASAGLAR KWIKPEN 100 UNIT/ML ~~LOC~~ SOPN: 70.0000 [IU] | PEN_INJECTOR | Freq: Every day | SUBCUTANEOUS | Status: DC

## 2018-10-06 MED ORDER — INSULIN ASPART 100 UNIT/ML ~~LOC~~ SOLN: 0.0000 [IU] | Freq: Three times a day (TID) | SUBCUTANEOUS | Status: DC

## 2018-10-06 MED ORDER — AMLODIPINE BESYLATE 5 MG PO TABS
10.0000 mg | ORAL_TABLET | Freq: Every day | ORAL | Status: DC
  Administered 2018-10-06: 10:00:00 10 mg via ORAL
  Filled 2018-10-06: qty 2

## 2018-10-06 MED ORDER — SODIUM CHLORIDE 0.9 % IV BOLUS
500.0000 mL | Freq: Once | INTRAVENOUS | Status: AC
  Administered 2018-10-06: 03:00:00 500 mL via INTRAVENOUS

## 2018-10-06 MED ORDER — HYDROCHLOROTHIAZIDE 25 MG PO TABS
12.5000 mg | ORAL_TABLET | Freq: Every day | ORAL | Status: DC
  Administered 2018-10-06: 12.5 mg via ORAL
  Filled 2018-10-06: qty 1

## 2018-10-06 MED ORDER — CLOPIDOGREL BISULFATE 75 MG PO TABS
75.0000 mg | ORAL_TABLET | Freq: Every day | ORAL | Status: DC
  Administered 2018-10-06: 75 mg via ORAL
  Filled 2018-10-06: qty 1

## 2018-10-06 NOTE — Social Work (Signed)
Social work consult received.  Social work consult in progress.

## 2018-10-06 NOTE — Progress Notes (Signed)
PT Cancellation Note  Patient Details Name: Victor Wise MRN: [id number] DOB: 1950/06/19   Cancelled Treatment:    Reason Eval/Treat Not Completed: PT screened, no needs identified, will sign off. Chart reviewed. Per medical record/recent notes, patient is medically stable, patient reporting to be at his baseline functional level. CSW note shows patient is refusing recommendations for facility placement at this time. Pt has expressed desire to return to home, and per CSW note [facility] services are being set up at this time. Given this information, no PT evaluation is currently warranted as it will not change the patient's DC disposition and it is not needed to set up services in the home. There is no clear documented acute functional change for the patient. If patient opts for facility placement or has new DME needs, or if other needs arise, PT can evaluate at that time.   3:16 PM, 10/06/18 Etta Grandchild, PT, DPT Physical Therapist - [facility]  [phone] ([place])    [place] 10/06/2018, 3:11 PM

## 2018-10-06 NOTE — ED Notes (Signed)
Pt resting comfortably, eating lunch at present

## 2018-10-06 NOTE — ED Notes (Signed)
Ambulates to bathroom without assist. To wheelchair awaiting daughter to arrive for transport home.

## 2018-10-06 NOTE — ED Provider Notes (Addendum)
[facility] Emergency Department Provider Note  ____________________________________________   I have reviewed the triage vital signs and the nursing notes. Where available I have reviewed prior notes and, if possible and indicated, outside hospital notes.    HISTORY  Chief Complaint Fall    HPI Victor Wise is a 68 y.o. male who has a history of CAD morbid obesity OSA depression diabetes chronic back pain, among other medical problems, patient seen and evaluated during the coronavirus epidemic during a time with low staffing, presents today complaining of having taken a fall.  He wears plastic shoes he states because of his poor feet and he was on a slippery surface and fell.  He bumped his head.  He is on Plavix.  He states he fell around 4 PM and was unable to get up.  He does have a life alert but did not want to press it because he did not want the attention.  He did not pass out.  He states that he has had no antecedent or subsequent chest pain shortness of breath nausea or vomiting.  He states his daughter feels that he is weak but he feels that he is not.    Past Medical History:  Diagnosis Date  . Allergy   . Basal cell carcinoma    forehead  . BPH (benign prostatic hyperplasia)   . Chronic back pain   . Depression   . Diabetes ([place])   . GERD (gastroesophageal reflux disease)   . Heart attack ([place])   . Hypertension   . Morbid obesity ([place])   . Obstructive sleep apnea   . 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 Status post insertion of spinal cord stimulator   . Stroke ([place])   . UTI (lower urinary tract infection)     Patient Active Problem List   Diagnosis Date Noted  . Bilateral leg edema 08/20/2018  . Diabetic ulcer of left foot ([place]) 04/18/2018  . Swelling of limb 04/06/2018  . Atherosclerosis of native arteries of the extremities with ulceration ([place]) 04/06/2018  . Edema of lower extremity 04/04/2018  . Diabetic foot infection ([place])  03/01/2018  . Spinal cord stimulator status 01/12/2018  . Hypertension 09/05/2017  . OSA on CPAP 09/05/2017  . Uncontrolled type 2 diabetes mellitus with hyperglycemia ([place]) 08/02/2017  . Peripheral vascular disease ([place]) 08/02/2017  . Mixed hyperlipidemia 08/02/2017  . Dysuria 08/02/2017  . Obesity, Class III, BMI 40-49.9 (morbid obesity) ([place]) 08/10/2016  . Hemiparesis affecting left side as late effect of stroke ([place]) 05/09/2016  . Symptomatic bradycardia 03/21/2016  . Falls frequently 02/26/2016  . Acute renal failure (ARF) ([place]) 02/18/2016  . Hypotension 02/18/2016  . Sick sinus syndrome ([place]) 10/28/2015  . Syncopal episodes 10/26/2015  . Syncope 10/26/2015  . Adjustment disorder with mixed anxiety and depressed mood 10/20/2015  . Dysthymia 10/20/2015  . CVA (cerebral infarction) 10/19/2015  . Urinary retention 03/25/2015  . 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 CAD (coronary artery disease) 12/29/2014  . Acute MI, anterolateral wall, subsequent episode of care ([place]) 12/19/2014  . SIRS (systemic inflammatory response syndrome) ([place]) 12/18/2014  . Systemic inflammatory response syndrome (sirs) of non-infectious origin without acute organ dysfunction ([place]) 12/18/2014  . Encounter for monitoring opioid maintenance therapy 11/10/2014  . LVH (left ventricular hypertrophy) due to hypertensive disease, with heart failure ([place]) 10/02/2014  . Benign essential hypertension 09/26/2014  . Chronic diastolic CHF (congestive heart failure), NYHA class 2 ([place]) 06/03/2014  . Pain syndrome, chronic  03/02/2014  . Diabetes ([place]) 11/08/2013  . Chronic radicular low back pain 08/21/2013  . Right shoulder pain 04/03/2013  . Urinary tract infection 03/16/2012  . Balanoposthitis 02/13/2012  . History of urinary stone 02/13/2012  . Hypertrophy of prostate with urinary obstruction and other lower urinary tract symptoms (LUTS) 02/13/2012  . 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 Redundant prepuce and  phimosis 02/13/2012  . Urge incontinence 02/13/2012  . Right foot pain 01/18/2012  . Chronic, continuous use of opioids 08/22/2011    Past Surgical History:  Procedure Laterality Date  . AMPUTATION TOE Left 04/19/2018   Procedure: AMPUTATION TOE-LEFT GREAT TOE;  Surgeon: Sharlotte Alamo, DPM;  Location: [facility] ORS;  Service: Podiatry;  Laterality: Left;  . CARDIAC CATHETERIZATION N/A 12/19/2014   Procedure: Coronary Stent Intervention;  Surgeon: Charolette Forward, MD;  Location: [place] [facility];  Service: Cardiovascular;  Laterality: N/A;  . CARDIAC CATHETERIZATION Left 12/19/2014   Procedure: Left Heart Cath and Coronary Angiography;  Surgeon: Dionisio Jarvis, MD;  Location: [place] [facility];  Service: Cardiovascular;  Laterality: Left;  . IRRIGATION AND DEBRIDEMENT FOOT Left 03/02/2018   Procedure: IRRIGATION AND DEBRIDEMENT FOOT;  Surgeon: Samara Deist, DPM;  Location: [facility] ORS;  Service: Podiatry;  Laterality: Left;  . IRRIGATION AND DEBRIDEMENT FOOT Left 03/08/2018   Procedure: IRRIGATION AND DEBRIDEMENT FOOT AND BONE;  Surgeon: Sharlotte Alamo, DPM;  Location: [facility] OR[facility];  Service: Podiatry;  Laterality: Left;  . IRRIGATION AND DEBRIDEMENT FOOT Left 04/19/2018   Procedure: IRRIGATION AND DEBRIDEMENT FOOT;  Surgeon: Sharlotte Alamo, DPM;  Location: [facility] OR[facility];  Service: Podiatry;  Laterality: Left;  . LOWER EXTREMITY ANGIOGRAPHY Left 03/05/2018   Procedure: Lower Extremity Angiography;  Surgeon: Algernon Huxley, MD;  Location: [place] [facility];  Service: Cardiovascular;  Laterality: Left;  . LOWER EXTREMITY ANGIOGRAPHY Left 03/08/2018   Procedure: LOWER EXTREMITY ANGIOGRAPHY;  Surgeon: Algernon Huxley, MD;  Location: [place] [facility];  Service: Cardiovascular;  Laterality: Left;  . PACEMAKER INSERTION Left 11/02/2015   Procedure: INSERTION PACEMAKER;  Surgeon: Isaias Cowman, MD;  Location: [facility] OR[facility];  Service: Cardiovascular;  Laterality: Left;  . Pain Stimulator    . Right toe amputation       Prior to Admission medications   Medication Sig Start Date End Date Taking? Authorizing Provider  acetaminophen (TYLENOL) 325 MG tablet Take 2 tablets (650 mg total) by mouth every 6 (six) hours as needed for mild pain (or Fever >/= 101). 04/23/18   Gouru, Illene Silver, MD  amLODipine (NORVASC) 10 MG tablet TAKE 1 TABLET BY MOUTH ONCE DAILY 09/07/18   Kendell Bane, NP  aspirin EC 81 MG tablet Take 81 mg by mouth daily.    [provider]  benazepril (LOTENSIN) 20 MG tablet Take 1 tablet (20 mg total) by mouth 2 (two) times daily. 10/01/18   Kendell Bane, NP  clopidogrel (PLAVIX) 75 MG tablet Take 1 tablet (75 mg total) by mouth daily. 06/28/18   Ronnell Freshwater, NP  DULoxetine (CYMBALTA) 60 MG capsule Take 1 capsule (60 mg total) by mouth daily. 03/13/18   Salary, Avel Peace, MD  Exenatide ER (BYDUREON) 2 MG PEN Inject 2 mg into the skin once a week. 07/17/17   Ronnell Freshwater, NP  fenofibrate 54 MG tablet TAKE 1 TABLET BY MOUTH A DAY AT SUPPERTIME 05/22/18   Ronnell Freshwater, NP  fluticasone (FLONASE) 50 MCG/ACT nasal spray Place 2 sprays into both nostrils daily.    [provider]  glucose blood (TRUE METRIX BLOOD GLUCOSE TEST) test strip Use as instructed three times a day diag e11.65 08/13/18   Ronnell Freshwater, NP  hydrochlorothiazide (HYDRODIURIL) 12.5 MG tablet Take 1 tablet (12.5 mg total) by mouth daily. 08/09/18   Ronnell Freshwater, NP  Insulin Glargine (BASAGLAR KWIKPEN) 100 UNIT/ML SOPN Inject 0.7 mLs (70 Units total) into the skin daily. 07/05/18   Kendell Bane, NP  Insulin Pen Needle (B-D UF III MINI PEN NEEDLES) 31G X 5 MM MISC Use as directed with insulin e11.65 12/26/17   Ronnell Freshwater, NP  insulin regular (NOVOLIN R) 100 units/mL injection Patient to use Novolin R  TiD prior to meals per sliding scale instructions. Max daily dose is 45 units per day. 06/22/18   Kendell Bane, NP  INSULIN SYRINGE 1CC/29G 29G X 1/2" 1 ML MISC Insulin injections QID, use  with lantus and regular insulin. DX E11.65 07/05/18   Kendell Bane, NP  Insulin Syringe-Needle U-100 (BD INSULIN SYRINGE U/F 1/2UNIT) 31G X 5/16" 0.3 ML MISC Indulin injection QID. Use with lantus and regular insulin Dx. 11.65 01/08/18   Lavera Guise, MD  NARCAN 4 MG/0.1ML LIQD Take 4 mg by mouth as needed (for opioid overdose).     [provider]  nitroGLYCERIN (NITROSTAT) 0.4 MG SL tablet Place 1 tablet (0.4 mg total) under the tongue every 5 (five) minutes x 3 doses as needed for chest pain. 12/21/14   Dixie Dials, MD  omega-3 acid ethyl esters (LOVAZA) 1 g capsule Take 1 capsule (1 g total) by mouth 2 (two) times daily. 09/07/18   Ronnell Freshwater, NP  Oxcarbazepine (TRILEPTAL) 300 MG tablet Take 300-600 mg by mouth See admin instructions. Take 1 tablet (300MG ) by mouth every morning and 2 tablets (600MG ) by mouth every night    [provider]  oxyCODONE (OXY IR/ROXICODONE) 5 MG immediate release tablet Take 1 tablet (5 mg total) by mouth every 4 (four) hours as needed for moderate pain. 04/23/18   Nicholes Mango, MD  oxyCODONE (OXYCONTIN) 10 mg 12 hr tablet Take 1 tablet (10 mg total) by mouth every 12 (twelve) hours. 04/23/18   Nicholes Mango, MD  oxyCODONE (OXYCONTIN) 20 mg 12 hr tablet Take by mouth. 06/09/18   [provider]  pantoprazole (PROTONIX) 40 MG tablet TAKE 1 TABLET BY MOUTH A DAY FOR REFLUX 09/07/18   Kendell Bane, NP  polyethylene glycol (MIRALAX / GLYCOLAX) packet Take 17 g by mouth daily as needed.    [provider]  pregabalin (LYRICA) 200 MG capsule Take 200 mg by mouth See admin instructions. Take 2 capsules (400MG ) by mouth every morning and 1 capsule (200MG ) by mouth every night    [provider]  tamsulosin (FLOMAX) 0.4 MG CAPS capsule Take 1 capsule (0.4 mg total) by mouth daily after supper. 09/07/18   Kendell Bane, NP    Allergies Amoxicillin; Other; Penicillins; Hydralazine; Cephalexin; Clindamycin; Crestor  [rosuvastatin calcium]; Metoprolol; Red dye; Statins; Sulfa antibiotics; Yellow dyes (non-tartrazine); Aspirin; and Tape  Family History  Problem Relation Age of Onset  . Breast cancer Mother   . Cancer Mother   . Hypertension Mother   . Lung cancer Father   . Hypertension Father   . Heart disease Father   . Cancer Father   . Kidney disease Sister   . Prostate cancer Neg Hx     Social History Social History   Tobacco Use  .  Smoking status: Former Smoker    Types: Cigarettes  . Smokeless tobacco: Never Used  . Tobacco comment: quit 45 years  Substance Use Topics  . Alcohol use: Not Currently    Alcohol/week: 0.0 standard drinks    Comment: have not had alcohol in 46yrs  . Drug use: No    Review of Systems Constitutional: No fever/chills Eyes: No visual changes. ENT: No sore throat. No stiff neck no neck pain Cardiovascular: Denies chest pain. Respiratory: Denies shortness of breath. Gastrointestinal:   no vomiting.  No diarrhea.  No constipation. Genitourinary: Negative for dysuria. Musculoskeletal: Negative lower extremity swelling Skin: Negative for rash. Neurological: Negative for severe headaches, focal weakness or numbness.   ____________________________________________   PHYSICAL EXAM:  VITAL SIGNS: ED Triage Vitals  Enc Vitals Group     BP 10/05/18 2255 (!) 152/72     Pulse Rate 10/05/18 2255 60     Resp 10/05/18 2255 15     Temp 10/05/18 2255 98.4 F (36.9 C)     Temp Source 10/05/18 2255 Oral     SpO2 10/05/18 2255 97 %     Weight 10/05/18 2257 300 lb (136.1 kg)     Height 10/05/18 2257 5\' 9"  (1.753 m)     Head Circumference --      Peak Flow --      Pain Score 10/05/18 2257 5     Pain Loc --      Pain Edu? --      Excl. in [place]? --     Constitutional: Alert and oriented. Well appearing and in no acute distress. Eyes: Conjunctivae are normal Head: There is bruising noted underneath the left eye with an abrasion but nothing that needs to be  sewn, no midface instability no other head trauma noted HEENT: No congestion/rhinnorhea. Mucous membranes are moist.  Oropharynx non-erythematous Neck:   Nontender with no meningismus, no masses, no stridor Cardiovascular: Normal rate, regular rhythm. Grossly normal heart sounds.  Good peripheral circulation. Respiratory: Normal respiratory effort.  No retractions. Lungs CTAB. Abdominal: Soft and nontender. No distention. No guarding no rebound morbid obesity noted Back:  There is no focal tenderness or step off.  there is no midline tenderness there are no lesions noted. there is no CVA tenderness Musculoskeletal: No lower extremity tenderness, no upper extremity tenderness. No joint effusions, no DVT signs strong distal pulses no edema Neurologic:  Normal speech and language. No gross focal neurologic deficits are appreciated.  Skin:  Skin is warm, dry and intact. No rash noted. Psychiatric: Mood and affect are normal. Speech and behavior are normal.  ____________________________________________   LABS (all labs ordered are listed, but only abnormal results are displayed)  Labs Reviewed  URINALYSIS, COMPLETE (UACMP) WITH MICROSCOPIC - Abnormal; Notable for the following components:      Result Value   Color, Urine YELLOW (*)    APPearance HAZY (*)    Leukocytes,Ua TRACE (*)    All other components within normal limits  ETHANOL  PROTIME-INR  CBC WITH DIFFERENTIAL/PLATELET  TROPONIN I  COMPREHENSIVE METABOLIC PANEL  CK    Pertinent labs  results that were available during my care of the patient were reviewed by me and considered in my medical decision making (see chart for details). ____________________________________________  EKG  I personally interpreted any EKGs ordered by me or triage Rate 61, underlying rhythm most likely sinus with a first-degree PR block, junctional rhythm also possible.  No acute ST elevation or depression.  Prior  EKG looks similar although at that time  he was atrially paced I do not see pacer spikes on this EKG. ____________________________________________  RADIOLOGY  Pertinent labs & imaging results that were available during my care of the patient were reviewed by me and considered in my medical decision making (see chart for details). If possible, patient and/or family made aware of any abnormal findings.  No results found. ____________________________________________    PROCEDURES  Procedure(s) performed: None  Procedures  Critical Care performed: None  ____________________________________________   INITIAL IMPRESSION / ASSESSMENT AND PLAN / ED COURSE  Pertinent labs & imaging results that were available during my care of the patient were reviewed by me and considered in my medical decision making (see chart for details).  Patient here after a what he describes as a non-syncopal mechanical fall involving plastic shoes and a slippery floor.  He did hit his head, we will obtain imaging, he does not have any neck pain but given the anterior impact, I will obtain CT cervical spine as a precaution.  We will check basic blood work urine and we will reassess.  Patient is adamant that he is at his baseline, is no evidence of hip fracture or other injury.  He has not been otherwise ill with COVID or other symptoms he states.  He does not drink alcohol.  There is no evidence of acute traumatic injury to his extremities such as hip fracture.  ----------------------------------------- 4:38 AM on 10/06/2018 -----------------------------------------  Patient insists he is at his baseline, daughter agrees, however, she does not feel that she can "take care of him anymore" because she is very small and he is very large and she is having trouble helping him get around the house.  They do not have help at the house.  She needs to help with the house or placement for him.  Is not safe in her opinion for him to go home and continuous things were.   Though for work, even though he is medically cleared, we will keep him in the emergency room and consult social work and physical therapy and come up with a plan tomorrow morning.  I have asked the pharmacy to upgrade his med list so I can order his home meds.  ----------------------------------------- 5:26 AM on 10/06/2018 -----------------------------------------  I discussed at length with his daughter, patient has had numerous falls at home, has refused placement for rehab after his total surgery, and has been on the floor for hours at a time living on his own.  She feels this is not a safe environment for him.  We will see what social work also offer.  Pharmacy is try to compile his home meds which we can then order for him while he waits.   ----------------------------------------- 7:01 AM on 10/06/2018 -----------------------------------------  Signed out at the end of  my shift to Dr. Sullivan Lone   ____________________________________________   FINAL CLINICAL IMPRESSION(S) / ED DIAGNOSES  Final diagnoses:  None      This chart was dictated using voice recognition software.  Despite best efforts to proofread,  errors can occur which can change meaning.      Schuyler Amor, MD 10/06/18 [id number]    Schuyler Amor, MD 10/06/18 [id number]    Schuyler Amor, MD 10/06/18 0175    Schuyler Amor, MD 10/06/18 [phone]

## 2018-10-06 NOTE — ED Notes (Signed)
Pt resting comfortably, offered toileting but pt refused.

## 2018-10-06 NOTE — ED Notes (Signed)
Await physical therapy evaluation.

## 2018-10-06 NOTE — ED Notes (Signed)
Patient states has not seen PT. Patient has arrangements for care at home so per MD OK to be discharged. Patient states daughter can transport him home. Calling her to arrange transport.

## 2018-10-06 NOTE — ED Notes (Signed)
Checked pts ability to stand up from laying position. Pt struggled to sit, even with assistance. With feet planted firmly on the floor, pt was not able to independently rise to a standing position. Attempted again with assistance and pt was not able to maintain a standing position and returned to a sitting position. Assisted pt lifting his legs back into the bed. Pt resting comfortably. Dr Burlene Arnt notified.

## 2018-10-06 NOTE — ED Notes (Signed)
Cleaned laceration on pts left cheek with chlorhexidine soap and saline. Wound edges clean and well approximated. Band-aid dressing applied. Pt tolerated procedure well. Pt resting comfortably.

## 2018-10-06 NOTE — Discharge Instructions (Addendum)
Please seek medical attention for any high fevers, chest pain, shortness of breath, change in behavior, persistent vomiting, bloody stool or any other new or concerning symptoms.  

## 2018-10-06 NOTE — TOC Initial Note (Signed)
Transition of Care [facility]) - Initial/Assessment Note    Patient Details  Name: Victor Wise MRN: [id number] Date of Birth: 19-Feb-1951  Transition of Care [facility]) CM/SW Contact:    Berenice Bouton, LCSW Phone Number: 10/06/2018, 10:15 AM  Clinical Narrative:   Clinician performed assessment at pt's bedside.  The patient is a 68 year old Caucasian male who presented to [facility] ED after a fall at home. Patient lives alone.   Current diagnosis: left great toe amputation and I&D 04/19/2018. PMH of HTN, former smoker, past MI, CHF, CVA, depression, renal disease, DM II, pacemaker, spinal cord stimulator.  Patient stated that he would like to be discharged to his apartment which is handicap assessable. Patient uses a wheelchair and a walker. Needs minimal assistance with ADL's. Patient is very weak.   He is refusing skills nursing facility at this time and would like to return home with home health services.  He noted that he used [facility] in the past but felt it was not enough.  He has also had a stay at [facility] but did not like the services.  Stated that "I don't feel it help me." Patient agreed to reestablish care with [facility].   Support systems:  Patient stated that he has one daughter that come to check on him every two weeks and "sometimes more often." Stated that his daughter will be picking him up from [facility] ED upon discharge.   Collateral:  [facility] attending - patient continues to drink alcohol.  Frequent falls at home. Unsure if due to alcohol use.  - Family support, daughter, Mrs. [name], [phone] -  Meds: [place] drugs Social worker provided this patient with a list of CMS Medicare Compare preferred list  3-5 star rating per [email or web address].  [facility]ne Cornea with [facility] aware that patient is in hospital.                Expected Discharge Plan: [place] Barriers to Discharge: No Barriers Identified   Patient Goals and CMS  Choice Patient states their goals for this hospitalization and ongoing recovery are:: Wants to get his strength back CMS Medicare.gov Compare Post Acute Care list provided to:: Patient Choice offered to / list presented to : Patient  Expected Discharge Plan and Services Expected Discharge Plan: [place] In-house Referral: Clinical Social Work Discharge Planning Services: CM Consult Post Acute Care Choice: Home Health([facility]) Living arrangements for the past 2 months: Apartment                           HH Arranged: Therapist, sports, Nurse's Aide, Social Work, PT, Refused SNF [place]: [place] ([place]) Date [place]: 10/06/18 Time [place]: 38 Representative spoke with at [place]: Corene Cornea  Prior Living Arrangements/Services Living arrangements for the past 2 months: [place] with:: Self Patient language and need for interpreter reviewed:: No Do you feel safe going back to the place where you live?: Yes      Need for Family Participation in Patient Care: No (Comment) Care giver support system in place?: Yes (comment)   Criminal Activity/Legal Involvement Pertinent to Current Situation/Hospitalization: No - Comment as needed  Activities of Daily Living      Permission Sought/Granted Permission sought to share information with : Case Manager, Customer service manager, Family Supports Permission granted to share information with : Yes, Verbal Permission Granted  Share Information  with NAME: Halacheff,[name] Daughter [phone]   Permission granted to share info w AGENCY: [facility]  Permission granted to share info w Relationship: Halacheff,[name] Daughter [phone]   Permission granted to share info w Contact Information: Halacheff,[name] Daughter [phone]   Emotional Assessment Appearance:: Appears older than stated age Attitude/Demeanor/Rapport: Engaged Affect (typically observed):  Calm Orientation: : Oriented to Self, Oriented to Place, Oriented to  Time, Oriented to Situation Alcohol / Substance Use: Alcohol Use Psych Involvement: No (comment)  Admission diagnosis:  Fall Patient Active Problem List   Diagnosis Date Noted  . Bilateral leg edema 08/20/2018  . Diabetic ulcer of left foot ([place]) 04/18/2018  . Swelling of limb 04/06/2018  . Atherosclerosis of native arteries of the extremities with ulceration ([place]) 04/06/2018  . Edema of lower extremity 04/04/2018  . Diabetic foot infection ([place]) 03/01/2018  . Spinal cord stimulator status 01/12/2018  . Hypertension 09/05/2017  . OSA on CPAP 09/05/2017  . Uncontrolled type 2 diabetes mellitus with hyperglycemia ([place]) 08/02/2017  . Peripheral vascular disease ([place]) 08/02/2017  . Mixed hyperlipidemia 08/02/2017  . Dysuria 08/02/2017  . Obesity, Class III, BMI 40-49.9 (morbid obesity) ([place]) 08/10/2016  . Hemiparesis affecting left side as late effect of stroke ([place]) 05/09/2016  . Symptomatic bradycardia 03/21/2016  . Falls frequently 02/26/2016  . Acute renal failure (ARF) ([place]) 02/18/2016  . Hypotension 02/18/2016  . Sick sinus syndrome ([place]) 10/28/2015  . Syncopal episodes 10/26/2015  . Syncope 10/26/2015  . Adjustment disorder with mixed anxiety and depressed mood 10/20/2015  . Dysthymia 10/20/2015  . CVA (cerebral infarction) 10/19/2015  . Urinary retention 03/25/2015  . 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 CAD (coronary artery disease) 12/29/2014  . Acute MI, anterolateral wall, subsequent episode of care ([place]) 12/19/2014  . SIRS (systemic inflammatory response syndrome) ([place]) 12/18/2014  . Systemic inflammatory response syndrome (sirs) of non-infectious origin without acute organ dysfunction ([place]) 12/18/2014  . Encounter for monitoring opioid maintenance therapy 11/10/2014  . LVH (left ventricular hypertrophy) due to hypertensive disease, with heart failure ([place]) 10/02/2014  . Benign essential hypertension  09/26/2014  . Chronic diastolic CHF (congestive heart failure), NYHA class 2 ([place]) 06/03/2014  . Pain syndrome, chronic 03/02/2014  . Diabetes ([place]) 11/08/2013  . Chronic radicular low back pain 08/21/2013  . Right shoulder pain 04/03/2013  . Urinary tract infection 03/16/2012  . Balanoposthitis 02/13/2012  . History of urinary stone 02/13/2012  . Hypertrophy of prostate with urinary obstruction and other lower urinary tract symptoms (LUTS) 02/13/2012  . 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 Redundant prepuce and phimosis 02/13/2012  . Urge incontinence 02/13/2012  . Right foot pain 01/18/2012  . Chronic, continuous use of opioids 08/22/2011   PCP:  Lavera Guise, MD Pharmacy:   [facility] [address] [place] [address] Phone: [phone] Fax: [place], [place] [address] [place] [address] Phone: [phone] Fax: [place], [place] [place] [address] Phone: [phone] Fax: [phone]  [facility] [place], [place] [address] Phone: [phone] Fax: [phone]  [place] [address]), [place] - [place]) [place] [address] Phone: [phone] Fax: [phone]     Social Determinants of Health (SDOH) Interventions    Readmission  Risk Interventions No flowsheet data found.

## 2018-10-08 DIAGNOSIS — Z8673 Personal history of transient ischemic attack (TIA), and cerebral infarction without residual deficits: Secondary | ICD-10-CM | POA: Diagnosis not present

## 2018-10-08 DIAGNOSIS — I70203 Unspecified atherosclerosis of native arteries of extremities, bilateral legs: Secondary | ICD-10-CM | POA: Diagnosis not present

## 2018-10-08 DIAGNOSIS — E1151 Type 2 diabetes mellitus with diabetic peripheral angiopathy without gangrene: Secondary | ICD-10-CM | POA: Diagnosis not present

## 2018-10-08 DIAGNOSIS — Z87891 Personal history of nicotine dependence: Secondary | ICD-10-CM | POA: Diagnosis not present

## 2018-10-08 DIAGNOSIS — I5032 Chronic diastolic (congestive) heart failure: Secondary | ICD-10-CM | POA: Diagnosis not present

## 2018-10-08 DIAGNOSIS — Z89412 Acquired absence of left great toe: Secondary | ICD-10-CM | POA: Diagnosis not present

## 2018-10-08 DIAGNOSIS — W19XXXD Unspecified fall, subsequent encounter: Secondary | ICD-10-CM | POA: Diagnosis not present

## 2018-10-08 DIAGNOSIS — I110 Hypertensive heart disease with heart failure: Secondary | ICD-10-CM | POA: Diagnosis not present

## 2018-10-08 DIAGNOSIS — S0083XD Contusion of other part of head, subsequent encounter: Secondary | ICD-10-CM | POA: Diagnosis not present

## 2018-10-08 LAB — URINE CULTURE: Culture: >=100000 — AB

## 2018-10-09 NOTE — Progress Notes (Signed)
Pharmacist Physician Communication  Pharmacist reviewing ED culture report. Urine culture with ESBL E. Coli. Patient presented after a fall, where he tripped and then hit his face. No complaints of urinary symptoms. UA with 6-10 WBC. Patient was not discharged on any antibiotics.  Discussed with EDP [place]. In agreement that patient does not require treatment.  Tawnya Crook, PharmD Pharmacy Resident  10/09/2018 1:25 PM

## 2018-10-11 ENCOUNTER — Other Ambulatory Visit: Payer: Self-pay | Admitting: Adult Health

## 2018-10-11 DIAGNOSIS — Z8673 Personal history of transient ischemic attack (TIA), and cerebral infarction without residual deficits: Secondary | ICD-10-CM | POA: Diagnosis not present

## 2018-10-11 DIAGNOSIS — I5032 Chronic diastolic (congestive) heart failure: Secondary | ICD-10-CM | POA: Diagnosis not present

## 2018-10-11 DIAGNOSIS — Z87891 Personal history of nicotine dependence: Secondary | ICD-10-CM | POA: Diagnosis not present

## 2018-10-11 DIAGNOSIS — W19XXXD Unspecified fall, subsequent encounter: Secondary | ICD-10-CM | POA: Diagnosis not present

## 2018-10-11 DIAGNOSIS — E1151 Type 2 diabetes mellitus with diabetic peripheral angiopathy without gangrene: Secondary | ICD-10-CM | POA: Diagnosis not present

## 2018-10-11 DIAGNOSIS — I70203 Unspecified atherosclerosis of native arteries of extremities, bilateral legs: Secondary | ICD-10-CM | POA: Diagnosis not present

## 2018-10-11 DIAGNOSIS — Z89412 Acquired absence of left great toe: Secondary | ICD-10-CM | POA: Diagnosis not present

## 2018-10-11 DIAGNOSIS — S0083XD Contusion of other part of head, subsequent encounter: Secondary | ICD-10-CM | POA: Diagnosis not present

## 2018-10-11 DIAGNOSIS — I110 Hypertensive heart disease with heart failure: Secondary | ICD-10-CM | POA: Diagnosis not present

## 2018-10-11 MED ORDER — INSULIN GLARGINE 100 UNITS/ML SOLOSTAR PEN: 70.0000 [IU] | PEN_INJECTOR | Freq: Every day | SUBCUTANEOUS | 3 refills | Status: DC

## 2018-10-12 ENCOUNTER — Telehealth: Payer: Self-pay

## 2018-10-12 ENCOUNTER — Other Ambulatory Visit: Payer: Self-pay | Admitting: Nurse Practitioner

## 2018-10-12 DIAGNOSIS — S0083XD Contusion of other part of head, subsequent encounter: Secondary | ICD-10-CM | POA: Diagnosis not present

## 2018-10-12 DIAGNOSIS — I110 Hypertensive heart disease with heart failure: Secondary | ICD-10-CM | POA: Diagnosis not present

## 2018-10-12 DIAGNOSIS — Z8673 Personal history of transient ischemic attack (TIA), and cerebral infarction without residual deficits: Secondary | ICD-10-CM | POA: Diagnosis not present

## 2018-10-12 DIAGNOSIS — Z87891 Personal history of nicotine dependence: Secondary | ICD-10-CM | POA: Diagnosis not present

## 2018-10-12 DIAGNOSIS — W19XXXD Unspecified fall, subsequent encounter: Secondary | ICD-10-CM | POA: Diagnosis not present

## 2018-10-12 DIAGNOSIS — Z89412 Acquired absence of left great toe: Secondary | ICD-10-CM | POA: Diagnosis not present

## 2018-10-12 DIAGNOSIS — I5032 Chronic diastolic (congestive) heart failure: Secondary | ICD-10-CM | POA: Diagnosis not present

## 2018-10-12 DIAGNOSIS — E1151 Type 2 diabetes mellitus with diabetic peripheral angiopathy without gangrene: Secondary | ICD-10-CM | POA: Diagnosis not present

## 2018-10-12 DIAGNOSIS — I70203 Unspecified atherosclerosis of native arteries of extremities, bilateral legs: Secondary | ICD-10-CM | POA: Diagnosis not present

## 2018-10-12 MED ORDER — GEMFIBROZIL 600 MG PO TABS: 600.0000 mg | ORAL_TABLET | Freq: Two times a day (BID) | ORAL | 2 refills | Status: DC

## 2018-10-12 NOTE — Telephone Encounter (Signed)
Victor Wise[facility], opened his home health care on the 10/08/2018. Nurse called to get verbal orders for nurse visits: 2 x this week 1 x a week for 3 weeks 1 x every other week for 5 weeks  Spoke with Nimisha and she gave the okay.

## 2018-10-17 DIAGNOSIS — I110 Hypertensive heart disease with heart failure: Secondary | ICD-10-CM | POA: Diagnosis not present

## 2018-10-17 DIAGNOSIS — Z89412 Acquired absence of left great toe: Secondary | ICD-10-CM | POA: Diagnosis not present

## 2018-10-17 DIAGNOSIS — S0083XD Contusion of other part of head, subsequent encounter: Secondary | ICD-10-CM | POA: Diagnosis not present

## 2018-10-17 DIAGNOSIS — I70203 Unspecified atherosclerosis of native arteries of extremities, bilateral legs: Secondary | ICD-10-CM | POA: Diagnosis not present

## 2018-10-17 DIAGNOSIS — I5032 Chronic diastolic (congestive) heart failure: Secondary | ICD-10-CM | POA: Diagnosis not present

## 2018-10-17 DIAGNOSIS — W19XXXD Unspecified fall, subsequent encounter: Secondary | ICD-10-CM | POA: Diagnosis not present

## 2018-10-17 DIAGNOSIS — E1151 Type 2 diabetes mellitus with diabetic peripheral angiopathy without gangrene: Secondary | ICD-10-CM | POA: Diagnosis not present

## 2018-10-17 DIAGNOSIS — Z87891 Personal history of nicotine dependence: Secondary | ICD-10-CM | POA: Diagnosis not present

## 2018-10-17 DIAGNOSIS — Z8673 Personal history of transient ischemic attack (TIA), and cerebral infarction without residual deficits: Secondary | ICD-10-CM | POA: Diagnosis not present

## 2018-10-22 DIAGNOSIS — E782 Mixed hyperlipidemia: Secondary | ICD-10-CM | POA: Diagnosis not present

## 2018-10-22 DIAGNOSIS — I2510 Atherosclerotic heart disease of native coronary artery without angina pectoris: Secondary | ICD-10-CM | POA: Diagnosis not present

## 2018-10-22 DIAGNOSIS — I5032 Chronic diastolic (congestive) heart failure: Secondary | ICD-10-CM | POA: Diagnosis not present

## 2018-10-22 DIAGNOSIS — I10 Essential (primary) hypertension: Secondary | ICD-10-CM | POA: Diagnosis not present

## 2018-10-22 DIAGNOSIS — R600 Localized edema: Secondary | ICD-10-CM | POA: Diagnosis not present

## 2018-10-22 DIAGNOSIS — I2109 ST elevation (STEMI) myocardial infarction involving other coronary artery of anterior wall: Secondary | ICD-10-CM | POA: Diagnosis not present

## 2018-10-24 ENCOUNTER — Telehealth: Payer: Self-pay | Admitting: Internal Medicine

## 2018-10-24 DIAGNOSIS — I110 Hypertensive heart disease with heart failure: Secondary | ICD-10-CM | POA: Diagnosis not present

## 2018-10-24 DIAGNOSIS — S0083XD Contusion of other part of head, subsequent encounter: Secondary | ICD-10-CM | POA: Diagnosis not present

## 2018-10-24 DIAGNOSIS — Z8673 Personal history of transient ischemic attack (TIA), and cerebral infarction without residual deficits: Secondary | ICD-10-CM | POA: Diagnosis not present

## 2018-10-24 DIAGNOSIS — I70203 Unspecified atherosclerosis of native arteries of extremities, bilateral legs: Secondary | ICD-10-CM | POA: Diagnosis not present

## 2018-10-24 DIAGNOSIS — I5032 Chronic diastolic (congestive) heart failure: Secondary | ICD-10-CM | POA: Diagnosis not present

## 2018-10-24 DIAGNOSIS — Z89412 Acquired absence of left great toe: Secondary | ICD-10-CM | POA: Diagnosis not present

## 2018-10-24 DIAGNOSIS — Z87891 Personal history of nicotine dependence: Secondary | ICD-10-CM | POA: Diagnosis not present

## 2018-10-24 DIAGNOSIS — W19XXXD Unspecified fall, subsequent encounter: Secondary | ICD-10-CM | POA: Diagnosis not present

## 2018-10-24 DIAGNOSIS — E1151 Type 2 diabetes mellitus with diabetic peripheral angiopathy without gangrene: Secondary | ICD-10-CM | POA: Diagnosis not present

## 2018-10-24 NOTE — Telephone Encounter (Signed)
tricia called to inform us that Victor Wise took a fall today , no injury with the fall patient does have medical alert and they came and helped him up , patient bs was 76 , pt does have a appointment tomorrow .

## 2018-10-25 ENCOUNTER — Other Ambulatory Visit: Payer: Self-pay

## 2018-10-25 ENCOUNTER — Ambulatory Visit (INDEPENDENT_AMBULATORY_CARE_PROVIDER_SITE_OTHER): Payer: Medicare HMO | Admitting: Adult Health

## 2018-10-25 ENCOUNTER — Encounter: Payer: Self-pay | Admitting: Adult Health

## 2018-10-25 VITALS — BP 111/68 | HR 60 | Temp 98.2°F | Resp 16 | Ht 68.0 in | Wt 296.0 lb

## 2018-10-25 DIAGNOSIS — F339 Major depressive disorder, recurrent, unspecified: Secondary | ICD-10-CM

## 2018-10-25 DIAGNOSIS — E1165 Type 2 diabetes mellitus with hyperglycemia: Secondary | ICD-10-CM | POA: Diagnosis not present

## 2018-10-25 DIAGNOSIS — I10 Essential (primary) hypertension: Secondary | ICD-10-CM | POA: Diagnosis not present

## 2018-10-25 DIAGNOSIS — E782 Mixed hyperlipidemia: Secondary | ICD-10-CM | POA: Diagnosis not present

## 2018-10-25 DIAGNOSIS — W19XXXD Unspecified fall, subsequent encounter: Secondary | ICD-10-CM

## 2018-10-25 LAB — POCT GLYCOSYLATED HEMOGLOBIN (HGB A1C): Hemoglobin A1C: 8.3 % — AB (ref 4.0–5.6)

## 2018-10-25 MED ORDER — INSULIN GLARGINE 100 UNITS/ML SOLOSTAR PEN: 70.0000 [IU] | PEN_INJECTOR | Freq: Every day | SUBCUTANEOUS | 3 refills | Status: DC

## 2018-10-25 MED ORDER — INSULIN GLARGINE 100 UNITS/ML SOLOSTAR PEN: 70.0000 [IU] | PEN_INJECTOR | Freq: Every day | SUBCUTANEOUS | 2 refills | Status: DC

## 2018-10-25 NOTE — Progress Notes (Signed)
[facility] [place], [place] [address]  Internal MEDICINE  Office Visit Note  Patient Name: Victor Wise  [id number]  [id number]  Date of Service: 10/25/2018     Chief Complaint  Patient presents with  . [facility] follow up , fell two weeks ago tripped over self, while wearing sandals.  no injury with fall, black left eye. went to ED .      HPI Pt is here for recent hospital follow up. On 10/05/18 patient had a fall and struck his head.  He denies LOC.  He was kept overnight to allow him to connect with social worker at his daughters request.  She feels like it is unsafe for him to be at home as he has had multiple falls in the past.  Education officer, museum evaluated him, and home health has been ordered to come in and help patient at home.  He presents today as a follow up on the ER visit. He reports since fall he has changed the shoes he was wearing.  He has had no more falls.  He denies any headaches, blurred vision or dizziness.   Current Medication: Outpatient Encounter Medications as of 10/25/2018  Medication Sig Note  . acetaminophen (TYLENOL) 325 MG tablet Take 2 tablets (650 mg total) by mouth every 6 (six) hours as needed for mild pain (or Fever >/= 101). 06/04/2018: PRN  . amLODipine (NORVASC) 10 MG tablet TAKE 1 TABLET BY MOUTH ONCE DAILY (Patient taking differently: Take 10 mg by mouth daily. )   . aspirin EC 81 MG tablet Take 81 mg by mouth daily.   . benazepril (LOTENSIN) 20 MG tablet Take 1 tablet (20 mg total) by mouth 2 (two) times daily.   . clopidogrel (PLAVIX) 75 MG tablet Take 1 tablet (75 mg total) by mouth daily.   . DULoxetine (CYMBALTA) 60 MG capsule Take 1 capsule (60 mg total) by mouth daily.   . Exenatide ER (BYDUREON) 2 MG PEN Inject 2 mg into the skin once a week. 10/06/2018: Every Friday  . fenofibrate 54 MG tablet TAKE 1 TABLET BY MOUTH A DAY AT SUPPERTIME   . fluticasone (FLONASE) 50 MCG/ACT nasal spray Place 2 sprays into both  nostrils daily.   . furosemide (LASIX) 20 MG tablet Take 20 mg by mouth daily.   [name] gemfibrozil (LOPID) 600 MG tablet Take 1 tablet (600 mg total) by mouth 2 (two) times daily before a meal.   . glucose blood (TRUE METRIX BLOOD GLUCOSE TEST) test strip Use as instructed three times a day diag e11.65   . hydrochlorothiazide (HYDRODIURIL) 12.5 MG tablet Take 1 tablet (12.5 mg total) by mouth daily.   . insulin glargine (LANTUS) 100 unit/mL SOPN Inject 0.7 mLs (70 Units total) into the skin daily. Inject 0.7 mls into skin daily 70 units total   . Insulin Pen Needle (B-D UF III MINI PEN NEEDLES) 31G X 5 MM MISC Use as directed with insulin e11.65   . insulin regular (NOVOLIN R) 100 units/mL injection Patient to use Novolin R [place] TiD prior to meals per sliding scale instructions. Max daily dose is 45 units per day.   . INSULIN SYRINGE 1CC/29G 29G X 1/2" 1 ML MISC Insulin injections QID, use with lantus and regular insulin. DX E11.65   . Insulin Syringe-Needle U-100 (BD INSULIN SYRINGE U/F 1/2UNIT) 31G X 5/16" 0.3 ML MISC Indulin injection QID. Use with lantus and regular insulin Dx. 11.65   .  NARCAN 4 MG/0.1ML LIQD Take 4 mg by mouth as needed (for opioid overdose).    . nitroGLYCERIN (NITROSTAT) 0.4 MG SL tablet Place 1 tablet (0.4 mg total) under the tongue every 5 (five) minutes x 3 doses as needed for chest pain.   . Oxcarbazepine (TRILEPTAL) 300 MG tablet Take 300-600 mg by mouth See admin instructions. Take 1 tablet (300MG ) by mouth every morning and 2 tablets (600MG ) by mouth every night   . oxyCODONE (OXY IR/ROXICODONE) 5 MG immediate release tablet Take 1 tablet (5 mg total) by mouth every 4 (four) hours as needed for moderate pain.   [name] oxyCODONE (OXYCONTIN) 10 mg 12 hr tablet Take 1 tablet (10 mg total) by mouth every 12 (twelve) hours.   [name] oxyCODONE (OXYCONTIN) 20 mg 12 hr tablet Take by mouth.   . pantoprazole (PROTONIX) 40 MG tablet TAKE 1 TABLET BY MOUTH A DAY FOR REFLUX   . polyethylene  glycol (MIRALAX / GLYCOLAX) packet Take 17 g by mouth daily as needed. 06/04/2018: PRN  . pregabalin (LYRICA) 200 MG capsule Take 200 mg by mouth See admin instructions. Take 2 capsules (400MG ) by mouth every morning and 1 capsule (200MG ) by mouth every night   . tamsulosin (FLOMAX) 0.4 MG CAPS capsule Take 1 capsule (0.4 mg total) by mouth daily after supper.    No facility-administered encounter medications on file as of 10/25/2018.     Surgical History: Past Surgical History:  Procedure Laterality Date  . AMPUTATION TOE Left 04/19/2018   Procedure: AMPUTATION TOE-LEFT GREAT TOE;  Surgeon: Sharlotte Alamo, DPM;  Location: [facility] ORS;  Service: Podiatry;  Laterality: Left;  . CARDIAC CATHETERIZATION N/A 12/19/2014   Procedure: Coronary Stent Intervention;  Surgeon: Charolette Forward, MD;  Location: [place] [facility];  Service: Cardiovascular;  Laterality: N/A;  . CARDIAC CATHETERIZATION Left 12/19/2014   Procedure: Left Heart Cath and Coronary Angiography;  Surgeon: Dionisio Markeese, MD;  Location: [place] [facility];  Service: Cardiovascular;  Laterality: Left;  . IRRIGATION AND DEBRIDEMENT FOOT Left 03/02/2018   Procedure: IRRIGATION AND DEBRIDEMENT FOOT;  Surgeon: Samara Deist, DPM;  Location: [facility] ORS;  Service: Podiatry;  Laterality: Left;  . IRRIGATION AND DEBRIDEMENT FOOT Left 03/08/2018   Procedure: IRRIGATION AND DEBRIDEMENT FOOT AND BONE;  Surgeon: Sharlotte Alamo, DPM;  Location: [facility] ORS;  Service: Podiatry;  Laterality: Left;  . IRRIGATION AND DEBRIDEMENT FOOT Left 04/19/2018   Procedure: IRRIGATION AND DEBRIDEMENT FOOT;  Surgeon: Sharlotte Alamo, DPM;  Location: [facility] ORS;  Service: Podiatry;  Laterality: Left;  . LOWER EXTREMITY ANGIOGRAPHY Left 03/05/2018   Procedure: Lower Extremity Angiography;  Surgeon: Algernon Huxley, MD;  Location: [place] [facility];  Service: Cardiovascular;  Laterality: Left;  . LOWER EXTREMITY ANGIOGRAPHY Left 03/08/2018   Procedure: LOWER EXTREMITY ANGIOGRAPHY;  Surgeon:  Algernon Huxley, MD;  Location: [place] [facility];  Service: Cardiovascular;  Laterality: Left;  . PACEMAKER INSERTION Left 11/02/2015   Procedure: INSERTION PACEMAKER;  Surgeon: Isaias Cowman, MD;  Location: [facility] ORS;  Service: Cardiovascular;  Laterality: Left;  . Pain Stimulator    . Right toe amputation      Medical History: Past Medical History:  Diagnosis Date  . Allergy   . Basal cell carcinoma    forehead  . BPH (benign prostatic hyperplasia)   . Chronic back pain   . Depression   . Diabetes ([place])   . GERD (gastroesophageal reflux disease)   . Heart attack ([place])   . Hypertension   .  Morbid obesity ([place])   . Obstructive sleep apnea   . 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 Status post insertion of spinal cord stimulator   . Stroke ([place])   . UTI (lower urinary tract infection)     Family History: Family History  Problem Relation Age of Onset  . Breast cancer Mother   . Cancer Mother   . Hypertension Mother   . Lung cancer Father   . Hypertension Father   . Heart disease Father   . Cancer Father   . Kidney disease Sister   . Prostate cancer Neg Hx     Social History   Socioeconomic History  . Marital status: Divorced    Spouse name: Not on file  . Number of children: Not on file  . Years of education: Not on file  . Highest education level: Not on file  Occupational History  . Not on file  Social Needs  . Financial resource strain: Not on file  . Food insecurity    Worry: Not on file    Inability: Not on file  . Transportation needs    Medical: Not on file    Non-medical: Not on file  Tobacco Use  . Smoking status: Former Smoker    Types: Cigarettes  . Smokeless tobacco: Never Used  . Tobacco comment: quit 45 years  Substance and Sexual Activity  . Alcohol use: Not Currently    Alcohol/week: 0.0 standard drinks    Comment: have not had alcohol in 24yrs  . Drug use: No  . Sexual activity: Not on file  Lifestyle  . Physical activity    Days  per week: Not on file    Minutes per session: Not on file  . Stress: Not on file  Relationships  . Social Herbalist on phone: Not on file    Gets together: Not on file    Attends religious service: Not on file    Active member of club or organization: Not on file    Attends meetings of clubs or organizations: Not on file    Relationship status: Not on file  . Intimate partner violence    Fear of current or ex partner: Not on file    Emotionally abused: Not on file    Physically abused: Not on file    Forced sexual activity: Not on file  Other Topics Concern  . Not on file  Social History Narrative  . Not on file      Review of Systems  Constitutional: Negative.  Negative for chills, fatigue and unexpected weight change.  HENT: Negative.  Negative for congestion, rhinorrhea, sneezing and sore throat.   Eyes: Negative for redness.  Respiratory: Negative.  Negative for cough, chest tightness and shortness of breath.   Cardiovascular: Negative.  Negative for chest pain and palpitations.  Gastrointestinal: Negative.  Negative for abdominal pain, constipation, diarrhea, nausea and vomiting.  Endocrine: Negative.   Genitourinary: Negative.  Negative for dysuria and frequency.  Musculoskeletal: Negative.  Negative for arthralgias, back pain, joint swelling and neck pain.  Skin: Negative.  Negative for rash.  Allergic/Immunologic: Negative.   Neurological: Negative.  Negative for tremors and numbness.  Hematological: Negative for adenopathy. Does not bruise/bleed easily.  Psychiatric/Behavioral: Negative.  Negative for behavioral problems, sleep disturbance and suicidal ideas. The patient is not nervous/anxious.     Vital Signs: BP 111/68   Pulse 60   Temp 98.2 F (36.8 C) (Oral)   Resp 16  Ht 5\' 8"  (1.727 m)   Wt 296 lb (134.3 kg)   SpO2 92%   BMI 45.01 kg/m    Physical Exam Vitals signs and nursing note reviewed.  Constitutional:      General: He is not  in acute distress.    Appearance: He is well-developed. He is not diaphoretic.  HENT:     Head: Normocephalic and atraumatic.     Mouth/Throat:     Pharynx: No oropharyngeal exudate.  Eyes:     Pupils: Pupils are equal, round, and reactive to light.  Neck:     Musculoskeletal: Normal range of motion and neck supple.     Thyroid: No thyromegaly.     Vascular: No JVD.     Trachea: No tracheal deviation.  Cardiovascular:     Rate and Rhythm: Normal rate and regular rhythm.     Heart sounds: Normal heart sounds. No murmur. No friction rub. No gallop.   Pulmonary:     Effort: Pulmonary effort is normal. No respiratory distress.     Breath sounds: Normal breath sounds. No wheezing or rales.  Chest:     Chest wall: No tenderness.  Abdominal:     Palpations: Abdomen is soft.     Tenderness: There is no abdominal tenderness. There is no guarding.  Musculoskeletal: Normal range of motion.  Lymphadenopathy:     Cervical: No cervical adenopathy.  Skin:    General: Skin is warm and dry.  Neurological:     Mental Status: He is alert and oriented to person, place, and time.     Cranial Nerves: No cranial nerve deficit.  Psychiatric:        Behavior: Behavior normal.        Thought Content: Thought content normal.        Judgment: Judgment normal.    Assessment/Plan: 1. Fall from standing, subsequent encounter Pt has changed shoes since fall.  Face bruise healing nicely.  Continue with home health at this time.   2. Uncontrolled type 2 diabetes mellitus with hyperglycemia (HCC) A1C down to 8.3 from 8.8 at last check.  Once again encouraged patient to focus on diet, limit carbs and sugar.  Take medications as prescribed.  - POCT HgB A1C  3. Essential hypertension BP well controlled.  Continue current medications as prescribed.   4. Depression, recurrent ([place]) Stable, doing well at this time.   5. Obesity, Class III, BMI 40-49.9 (morbid obesity) (HCC) Obesity Counseling: Risk  Assessment: An assessment of behavioral risk factors was made today and includes lack of exercise sedentary lifestyle, lack of portion control and poor dietary habits.  Risk Modification Advice: She was counseled on portion control guidelines. Restricting daily caloric intake to. . The detrimental long term effects of obesity on her health and ongoing poor compliance was also discussed with the patient.    General Counseling: farrel guimond understanding of the findings of todays visit and agrees with plan of treatment. I have discussed any further diagnostic evaluation that may be needed or ordered today. We also reviewed his medications today. he has been encouraged to call the office with any questions or concerns that should arise related to todays visit.     Orders Placed This Encounter  Procedures  . POCT HgB A1C      I have reviewed all medical records from hospital follow up including radiology reports and consults from other physicians. Appropriate follow up diagnostics will be scheduled as needed. Patient/ Family understands the plan of  treatment.   Time spent 25 minutes.   Orson Gear AGNP-C Internal Medicine

## 2018-10-29 ENCOUNTER — Other Ambulatory Visit: Payer: Self-pay | Admitting: Adult Health

## 2018-10-31 DIAGNOSIS — I70203 Unspecified atherosclerosis of native arteries of extremities, bilateral legs: Secondary | ICD-10-CM | POA: Diagnosis not present

## 2018-10-31 DIAGNOSIS — I5032 Chronic diastolic (congestive) heart failure: Secondary | ICD-10-CM | POA: Diagnosis not present

## 2018-10-31 DIAGNOSIS — Z89412 Acquired absence of left great toe: Secondary | ICD-10-CM | POA: Diagnosis not present

## 2018-10-31 DIAGNOSIS — Z8673 Personal history of transient ischemic attack (TIA), and cerebral infarction without residual deficits: Secondary | ICD-10-CM | POA: Diagnosis not present

## 2018-10-31 DIAGNOSIS — W19XXXD Unspecified fall, subsequent encounter: Secondary | ICD-10-CM | POA: Diagnosis not present

## 2018-10-31 DIAGNOSIS — Z87891 Personal history of nicotine dependence: Secondary | ICD-10-CM | POA: Diagnosis not present

## 2018-10-31 DIAGNOSIS — E1151 Type 2 diabetes mellitus with diabetic peripheral angiopathy without gangrene: Secondary | ICD-10-CM | POA: Diagnosis not present

## 2018-10-31 DIAGNOSIS — S0083XD Contusion of other part of head, subsequent encounter: Secondary | ICD-10-CM | POA: Diagnosis not present

## 2018-10-31 DIAGNOSIS — I110 Hypertensive heart disease with heart failure: Secondary | ICD-10-CM | POA: Diagnosis not present

## 2018-11-01 ENCOUNTER — Telehealth: Payer: Self-pay

## 2018-11-02 ENCOUNTER — Emergency Department: Payer: Medicare HMO

## 2018-11-02 ENCOUNTER — Emergency Department
Admission: EM | Admit: 2018-11-02 | Discharge: 2018-11-02 | Disposition: A | Discharge status: Home / Self Care | Payer: Medicare HMO | Attending: Student in an Organized Health Care Education/Training Program | Admitting: Student in an Organized Health Care Education/Training Program

## 2018-11-02 ENCOUNTER — Encounter: Payer: Self-pay | Admitting: Emergency Medicine

## 2018-11-02 ENCOUNTER — Telehealth: Payer: Self-pay

## 2018-11-02 ENCOUNTER — Other Ambulatory Visit: Payer: Self-pay

## 2018-11-02 DIAGNOSIS — W19XXXA Unspecified fall, initial encounter: Secondary | ICD-10-CM | POA: Diagnosis not present

## 2018-11-02 DIAGNOSIS — Z79899 Other long term (current) drug therapy: Secondary | ICD-10-CM | POA: Insufficient documentation

## 2018-11-02 DIAGNOSIS — W050XXA Fall from non-moving wheelchair, initial encounter: Secondary | ICD-10-CM | POA: Insufficient documentation

## 2018-11-02 DIAGNOSIS — Z794 Long term (current) use of insulin: Secondary | ICD-10-CM | POA: Insufficient documentation

## 2018-11-02 DIAGNOSIS — I2510 Atherosclerotic heart disease of native coronary artery without angina pectoris: Secondary | ICD-10-CM | POA: Insufficient documentation

## 2018-11-02 DIAGNOSIS — I5032 Chronic diastolic (congestive) heart failure: Secondary | ICD-10-CM | POA: Insufficient documentation

## 2018-11-02 DIAGNOSIS — E161 Other hypoglycemia: Secondary | ICD-10-CM | POA: Diagnosis not present

## 2018-11-02 DIAGNOSIS — E119 Type 2 diabetes mellitus without complications: Secondary | ICD-10-CM | POA: Insufficient documentation

## 2018-11-02 DIAGNOSIS — Z85828 Personal history of other malignant neoplasm of skin: Secondary | ICD-10-CM | POA: Diagnosis not present

## 2018-11-02 DIAGNOSIS — Z7902 Long term (current) use of antithrombotics/antiplatelets: Secondary | ICD-10-CM | POA: Insufficient documentation

## 2018-11-02 DIAGNOSIS — Z87891 Personal history of nicotine dependence: Secondary | ICD-10-CM | POA: Diagnosis not present

## 2018-11-02 DIAGNOSIS — Z8673 Personal history of transient ischemic attack (TIA), and cerebral infarction without residual deficits: Secondary | ICD-10-CM | POA: Insufficient documentation

## 2018-11-02 DIAGNOSIS — M25561 Pain in right knee: Secondary | ICD-10-CM | POA: Insufficient documentation

## 2018-11-02 DIAGNOSIS — Z950 Presence of cardiac pacemaker: Secondary | ICD-10-CM | POA: Insufficient documentation

## 2018-11-02 DIAGNOSIS — Z7982 Long term (current) use of aspirin: Secondary | ICD-10-CM | POA: Insufficient documentation

## 2018-11-02 DIAGNOSIS — I10 Essential (primary) hypertension: Secondary | ICD-10-CM | POA: Diagnosis not present

## 2018-11-02 DIAGNOSIS — I110 Hypertensive heart disease with heart failure: Secondary | ICD-10-CM | POA: Insufficient documentation

## 2018-11-02 DIAGNOSIS — E162 Hypoglycemia, unspecified: Secondary | ICD-10-CM | POA: Diagnosis not present

## 2018-11-02 DIAGNOSIS — I252 Old myocardial infarction: Secondary | ICD-10-CM | POA: Insufficient documentation

## 2018-11-02 DIAGNOSIS — R296 Repeated falls: Secondary | ICD-10-CM | POA: Diagnosis not present

## 2018-11-02 LAB — CBC WITH DIFFERENTIAL/PLATELET
Abs Immature Granulocytes: 0.07 10*3/uL (ref 0.00–0.07)
Basophils Absolute: 0.1 10*3/uL (ref 0.0–0.1)
Basophils Relative: 2 %
Eosinophils Absolute: 0.2 10*3/uL (ref 0.0–0.5)
Eosinophils Relative: 5 %
HCT: 37.9 % — ABNORMAL LOW (ref 39.0–52.0)
Hemoglobin: 11.9 g/dL — ABNORMAL LOW (ref 13.0–17.0)
Immature Granulocytes: 2 %
Lymphocytes Relative: 50 %
Lymphs Abs: 2 10*3/uL (ref 0.7–4.0)
MCH: 24.6 pg — ABNORMAL LOW (ref 26.0–34.0)
MCHC: 31.4 g/dL (ref 30.0–36.0)
MCV: 78.3 fL — ABNORMAL LOW (ref 80.0–100.0)
Monocytes Absolute: 0.5 10*3/uL (ref 0.1–1.0)
Monocytes Relative: 11 %
Neutro Abs: 1.2 10*3/uL — ABNORMAL LOW (ref 1.7–7.7)
Neutrophils Relative %: 30 %
Platelets: 210 10*3/uL (ref 150–400)
RBC: 4.84 MIL/uL (ref 4.22–5.81)
RDW: 19 % — ABNORMAL HIGH (ref 11.5–15.5)
WBC: 4.1 10*3/uL (ref 4.0–10.5)
nRBC: 0 % (ref 0.0–0.2)

## 2018-11-02 LAB — COMPREHENSIVE METABOLIC PANEL
ALT: 25 U/L (ref 0–44)
AST: 27 U/L (ref 15–41)
Albumin: 4.1 g/dL (ref 3.5–5.0)
Alkaline Phosphatase: 65 U/L (ref 38–126)
Anion gap: 9 (ref 5–15)
BUN: 23 mg/dL (ref 8–23)
CO2: 31 mmol/L (ref 22–32)
Calcium: 9.3 mg/dL (ref 8.9–10.3)
Chloride: 103 mmol/L (ref 98–111)
Creatinine, Ser: 1.04 mg/dL (ref 0.61–1.24)
GFR calc Af Amer: >60 mL/min (ref >60)
GFR calc non Af Amer: >60 mL/min (ref >60)
Glucose, Bld: 84 mg/dL (ref 70–99)
Potassium: 3.9 mmol/L (ref 3.5–5.1)
Sodium: 143 mmol/L (ref 135–145)
Total Bilirubin: 0.5 mg/dL (ref 0.3–1.2)
Total Protein: 8.4 g/dL — ABNORMAL HIGH (ref 6.5–8.1)

## 2018-11-02 LAB — GLUCOSE, CAPILLARY
Glucose-Capillary: 51 mg/dL — ABNORMAL LOW (ref 70–99)
Glucose-Capillary: 69 mg/dL — ABNORMAL LOW (ref 70–99)
Glucose-Capillary: 179 mg/dL — ABNORMAL HIGH (ref 70–99)

## 2018-11-02 MED ORDER — TETANUS-DIPHTH-ACELL PERTUSSIS 5-2.5-18.5 LF-MCG/0.5 IM SUSP
0.5000 mL | Freq: Once | INTRAMUSCULAR | Status: AC
  Administered 2018-11-02: 0.5 mL via INTRAMUSCULAR
  Filled 2018-11-02: qty 0.5

## 2018-11-02 MED ORDER — BENAZEPRIL HCL 20 MG PO TABS
20.0000 mg | ORAL_TABLET | Freq: Two times a day (BID) | ORAL | Status: DC
  Filled 2018-11-02 (×2): qty 1

## 2018-11-02 MED ORDER — DEXTROSE 50 % IV SOLN
0.5000 | Freq: Once | INTRAVENOUS | Status: AC
  Administered 2018-11-02: 08:00:00 25 mL via INTRAVENOUS

## 2018-11-02 MED ORDER — ACETAMINOPHEN 325 MG PO TABS
650.0000 mg | ORAL_TABLET | Freq: Four times a day (QID) | ORAL | Status: DC | PRN
  Administered 2018-11-02: 650 mg via ORAL
  Filled 2018-11-02: qty 2

## 2018-11-02 MED ORDER — DEXTROSE 50 % IV SOLN
INTRAVENOUS | Status: AC
  Administered 2018-11-02: 25 mL via INTRAVENOUS
  Filled 2018-11-02: qty 50

## 2018-11-02 MED ORDER — OXYCODONE HCL 5 MG PO TABS
5.0000 mg | ORAL_TABLET | ORAL | Status: DC | PRN
  Administered 2018-11-02: 5 mg via ORAL
  Filled 2018-11-02: qty 1

## 2018-11-02 NOTE — ED Triage Notes (Signed)
Pt presents to ED via [facility] from home with c/o fall from his wheelchair. Per EMS c/o R knee pain, denies deformity but some swelling noted to R knee. Pt denies taking BP meds today.    HR 86 BP 200/106 98% RA CBG 69

## 2018-11-02 NOTE — ED Notes (Signed)
NAD noted at time of D/C. Pt taken to lobby via wheelchair by [place], EDT. Pt's daughter arrived to pick up patient. Pt denies comments/concerns regarding D/C instructions.

## 2018-11-02 NOTE — ED Notes (Signed)
CBG 51. MD aware.

## 2018-11-02 NOTE — ED Notes (Signed)
Pt transported to Xray at this time.

## 2018-11-02 NOTE — ED Notes (Signed)
Pt returned from Xray at this time  

## 2018-11-02 NOTE — ED Notes (Signed)
Abrasion to R posterior forearm cleaned up and bandaid placed by this RN.

## 2018-11-02 NOTE — ED Notes (Signed)
Pt requesting to wait to go to X-ray until after he finishes his sandwich, repeat CBG 69, per MD pt okay to eat. Pt given OJ and sandwich tray at this time.

## 2018-11-02 NOTE — Telephone Encounter (Signed)
Called patient to schd appt face to face for pcp to discuss dme for roller/wheelchair/walker. Victor Wise

## 2018-11-02 NOTE — ED Notes (Signed)
Pt states at baseline uses a wheelchair due to his knee giving out, before today was able to stand and pivot out of his wheelchair to get around. Pt states his daughter and neighbors frequent the house to assist him. Pt states hx of knee surgery to R knee, states in 1978 had surgery to repair a broken knee.

## 2018-11-02 NOTE — ED Provider Notes (Signed)
[facility] Emergency Department Provider Note    First MD Initiated Contact with Patient 11/02/18 [phone]     (approximate)  I have reviewed the triage vital signs and the nursing notes.   HISTORY  Chief Complaint Fall and Knee Pain    HPI Victor Wise is a 68 y.o. male below listed extensive past medical history presents the ER after mechanical fall this morning.  States he was getting up to start his day had not taken any of his blood pressure medications or had any breakfast yet states that he slid out of his wheelchair hitting his right knee.  Was unable to get back in a wheelchair and called EMS.  Denies hitting his head.  No back pain.  No nausea or vomiting.  No chest pain or shortness of breath.  States he does have mild right knee pain since the fall.    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[place])    Status post insertion of spinal cord stimulator    Stroke ([place])    UTI (lower urinary tract infection)    Family History  Problem Relation Age of Onset   Breast cancer Mother    Cancer Mother    Hypertension Mother    Lung cancer Father    Hypertension Father    Heart disease Father    Cancer Father    Kidney disease Sister    Prostate cancer Neg Hx    Past Surgical History:  Procedure Laterality Date   AMPUTATION TOE Left 04/19/2018   Procedure: AMPUTATION TOE-LEFT GREAT TOE;  Surgeon: Sharlotte Alamo, DPM;  Location: [facility];  Service: Podiatry;  Laterality: Left;   CARDIAC CATHETERIZATION N/A 12/19/2014   Procedure: Coronary Stent Intervention;  Surgeon: Charolette Forward, MD;  Location: [place] [facility];  Service:  Cardiovascular;  Laterality: N/A;   CARDIAC CATHETERIZATION Left 12/19/2014   Procedure: Left Heart Cath and Coronary Angiography;  Surgeon: Dionisio Tadao, MD;  Location: [place] [facility];  Service: Cardiovascular;  Laterality: Left;   IRRIGATION AND DEBRIDEMENT FOOT Left 03/02/2018   Procedure: IRRIGATION AND DEBRIDEMENT FOOT;  Surgeon: Samara Deist, DPM;  Location: [facility];  Service: Podiatry;  Laterality: Left;   IRRIGATION AND DEBRIDEMENT FOOT Left 03/08/2018   Procedure: IRRIGATION AND DEBRIDEMENT FOOT AND BONE;  Surgeon: Sharlotte Alamo, DPM;  Location: [facility];  Service: Podiatry;  Laterality: Left;   IRRIGATION AND DEBRIDEMENT FOOT Left 04/19/2018   Procedure: IRRIGATION AND DEBRIDEMENT FOOT;  Surgeon: Sharlotte Alamo, DPM;  Location: [facility];  Service: Podiatry;  Laterality: Left;   LOWER EXTREMITY ANGIOGRAPHY Left 03/05/2018   Procedure: Lower Extremity Angiography;  Surgeon: Algernon Huxley, MD;  Location: [place] [facility];  Service: Cardiovascular;  Laterality: Left;   LOWER EXTREMITY ANGIOGRAPHY Left 03/08/2018   Procedure: LOWER EXTREMITY ANGIOGRAPHY;  Surgeon: Algernon Huxley, MD;  Location: [place] [facility];  Service: Cardiovascular;  Laterality: Left;   PACEMAKER INSERTION Left 11/02/2015   Procedure: INSERTION PACEMAKER;  Surgeon: Isaias Cowman, MD;  Location: [facility];  Service: Cardiovascular;  Laterality: Left;  Pain Stimulator     Right toe amputation     Patient Active Problem List   Diagnosis Date Noted   Bilateral leg edema 08/20/2018   Diabetic ulcer of left foot ([place]) 04/18/2018   Swelling of limb 04/06/2018   Atherosclerosis of native arteries of the extremities with ulceration ([place]) 04/06/2018   Edema of lower extremity 04/04/2018   Diabetic foot infection ([place]) 03/01/2018   Spinal cord stimulator status 01/12/2018   Essential hypertension 09/05/2017   OSA on CPAP 09/05/2017   Uncontrolled type 2 diabetes mellitus with hyperglycemia  ([place]) 08/02/2017   Peripheral vascular disease ([place]) 08/02/2017   Mixed hyperlipidemia 08/02/2017   Dysuria 08/02/2017   Obesity, Class III, BMI 40-49.9 (morbid obesity) ([place]) 08/10/2016   Hemiparesis affecting left side as late effect of stroke ([place]) 05/09/2016   Symptomatic bradycardia 03/21/2016   Falls frequently 02/26/2016   Acute renal failure (ARF) ([place]) 02/18/2016   Hypotension 02/18/2016   Sick sinus syndrome (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   CAD (coronary artery disease) 12/29/2014   Acute MI, anterolateral wall, subsequent episode of care ([place]) 12/19/2014   SIRS (systemic inflammatory response syndrome) ([place]) 12/18/2014   Systemic inflammatory response syndrome (sirs) of non-infectious origin without acute organ dysfunction ([place]) 12/18/2014   Encounter for monitoring opioid maintenance therapy 11/10/2014   LVH (left ventricular hypertrophy) due to hypertensive disease, with heart failure ([place]) 10/02/2014   Benign essential hypertension 09/26/2014   Chronic diastolic CHF (congestive heart failure), NYHA class 2 ([place]) 06/03/2014   Pain syndrome, chronic 03/02/2014   Diabetes ([place]) 11/08/2013   Chronic radicular low back pain 08/21/2013   Right shoulder pain 04/03/2013   Urinary tract infection 03/16/2012   Balanoposthitis 02/13/2012   History of urinary stone 02/13/2012   Hypertrophy of prostate with urinary obstruction and other lower urinary tract symptoms (LUTS) 02/13/2012   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 Authorizing Provider  acetaminophen (TYLENOL) 325 MG tablet Take 2 tablets (650 mg total) by mouth every 6 (six) hours as needed for mild pain (or Fever >/= 101). 04/23/18   Gouru, Illene Silver, MD  amLODipine (NORVASC) 10 MG tablet TAKE 1 TABLET BY MOUTH ONCE DAILY Patient taking differently: Take 10 mg by mouth daily.  09/07/18   Kendell Bane, NP  aspirin EC 81 MG tablet Take 81 mg by mouth daily.    [provider]  benazepril (LOTENSIN) 20 MG tablet Take 1 tablet (20 mg total) by mouth 2 (two) times daily. 10/01/18   Kendell Bane, NP  clopidogrel (PLAVIX) 75 MG tablet Take 1 tablet (75 mg total) by mouth daily. 06/28/18   Ronnell Freshwater, NP  DULoxetine (CYMBALTA) 60 MG capsule Take 1 capsule (60 mg total) by mouth daily. 03/13/18   Salary, Avel Peace, MD  Exenatide ER (BYDUREON) 2 MG PEN Inject 2 mg into the skin once a week. 07/17/17   Ronnell Freshwater, NP  fenofibrate 54 MG tablet TAKE 1 TABLET BY MOUTH A DAY AT SUPPERTIME 05/22/18   Boscia,  Heather E, NP  fluticasone (FLONASE) 50 MCG/ACT nasal spray Place 2 sprays into both nostrils daily.    [provider]  furosemide (LASIX) 20 MG tablet Take 20 mg by mouth daily. 10/01/18   [provider]  gemfibrozil (LOPID) 600 MG tablet Take 1 tablet (600 mg total) by mouth 2 (two) times daily before a meal. 10/12/18   Boscia, Greer Ee, NP  glucose blood (TRUE METRIX BLOOD GLUCOSE TEST) test strip Use as instructed three times a day diag e11.65 08/13/18   Ronnell Freshwater, NP  hydrochlorothiazide (HYDRODIURIL) 12.5 MG tablet Take 1 tablet (12.5 mg total) by mouth daily. 08/09/18   Ronnell Freshwater, NP  Insulin Pen Needle (B-D UF III MINI PEN NEEDLES) 31G X 5 MM MISC Use as directed with insulin e11.65 12/26/17   Ronnell Freshwater, NP  insulin regular (NOVOLIN R) 100 units/mL injection Patient to use Novolin R [place] TiD prior to meals per sliding scale instructions. Max daily dose is 45  units per day. 06/22/18   Kendell Bane, NP  INSULIN SYRINGE 1CC/29G 29G X 1/2" 1 ML MISC Insulin injections QID, use with lantus and regular insulin. DX E11.65 07/05/18   Kendell Bane, NP  Insulin Syringe-Needle U-100 (BD INSULIN SYRINGE U/F 1/2UNIT) 31G X 5/16" 0.3 ML MISC Indulin injection QID. Use with lantus and regular insulin Dx. 11.65 01/08/18   Lavera Guise, MD  LANTUS SOLOSTAR 100 UNIT/ML Solostar Pen INJECT 70 UNITS SUBQ ONCE DAILY 10/29/18   Ronnell Freshwater, NP  NARCAN 4 MG/0.1ML LIQD Take 4 mg by mouth as needed (for opioid overdose).     [provider]  nitroGLYCERIN (NITROSTAT) 0.4 MG SL tablet Place 1 tablet (0.4 mg total) under the tongue every 5 (five) minutes x 3 doses as needed for chest pain. 12/21/14   Dixie Dials, MD  Oxcarbazepine (TRILEPTAL) 300 MG tablet Take 300-600 mg by mouth See admin instructions. Take 1 tablet (300MG ) by mouth every morning and 2 tablets (600MG ) by mouth every night    [provider]  oxyCODONE (OXY IR/ROXICODONE) 5 MG immediate release tablet Take 1 tablet (5 mg total) by mouth every 4 (four) hours as needed for moderate pain. 04/23/18   Nicholes Mango, MD  oxyCODONE (OXYCONTIN) 10 mg 12 hr tablet Take 1 tablet (10 mg total) by mouth every 12 (twelve) hours. 04/23/18   Nicholes Mango, MD  oxyCODONE (OXYCONTIN) 20 mg 12 hr tablet Take by mouth. 06/09/18   [provider]  pantoprazole (PROTONIX) 40 MG tablet TAKE 1 TABLET BY MOUTH A DAY FOR REFLUX 09/07/18   Kendell Bane, NP  polyethylene glycol (MIRALAX / GLYCOLAX) packet Take 17 g by mouth daily as needed.    [provider]  pregabalin (LYRICA) 200 MG capsule Take 200 mg by mouth See admin instructions. Take 2 capsules (400MG ) by mouth every morning and 1 capsule (200MG ) by mouth every night    [provider]  tamsulosin (FLOMAX) 0.4 MG CAPS capsule Take 1 capsule (0.4 mg total) by mouth daily after supper. 09/07/18   Kendell Bane, NP     Allergies Amoxicillin, Other, Penicillins, Hydralazine, Cephalexin, Clindamycin, Crestor [rosuvastatin calcium], Metoprolol, Red dye, Statins, Sulfa antibiotics, Yellow dyes (non-tartrazine), Aspirin, and Tape    Social History Social History   Tobacco Use   Smoking status: Former Smoker    Types: Cigarettes   Smokeless tobacco: Never Used   Tobacco comment: quit 45 years  Substance Use Topics  Alcohol use: Not Currently    Alcohol/week: 0.0 standard drinks    Comment: have not had alcohol in 33yrs   Drug use: No    Review of Systems Patient denies headaches, rhinorrhea, blurry vision, numbness, shortness of breath, chest pain, edema, cough, abdominal pain, nausea, vomiting, diarrhea, dysuria, fevers, rashes or hallucinations unless otherwise stated above in HPI. ____________________________________________   PHYSICAL EXAM:  VITAL SIGNS: Vitals:   11/02/18 0934 11/02/18 1033  BP: (!) 147/75 (!) 133/48  Pulse: (!) 59 60  Resp: 18 18  Temp:    SpO2: 99% 98%    Constitutional: Alert and oriented. Morbidly obese Eyes: Conjunctivae are normal.  Head: Atraumatic. Nose: No congestion/rhinnorhea. Mouth/Throat: Mucous membranes are moist.   Neck: No stridor. Painless ROM.  Cardiovascular: Normal rate, regular rhythm. Grossly normal heart sounds.  Good peripheral circulation. Respiratory: Normal respiratory effort.  No retractions. Lungs CTAB. Gastrointestinal: Soft and nontender. No distention. No abdominal bruits. No CVA tenderness. Genitourinary:  Musculoskeletal: ttp of right knee, no deformity or shortening. Good distal pulses.  No hip pain.  nontender left knee joint effusion Neurologic:  Normal speech and language. No gross focal neurologic deficits are appreciated. No facial droop Skin:  Superficial abrasion to right posterior foremarm.  No bony ttp.  Painless supination and pronation. Skin is warm, dry and intact. No rash noted. Psychiatric: Mood and  affect are normal. Speech and behavior are normal.  ____________________________________________   LABS (all labs ordered are listed, but only abnormal results are displayed)  Results for orders placed or performed during the hospital encounter of 11/02/18 (from the past 24 hour(s))  Glucose, capillary     Status: Abnormal   Collection Time: 11/02/18  7:23 AM  Result Value Ref Range   Glucose-Capillary 51 (L) 70 - 99 mg/dL  CBC with Differential/Platelet     Status: Abnormal   Collection Time: 11/02/18  7:30 AM  Result Value Ref Range   WBC 4.1 4.0 - 10.5 K/uL   RBC 4.84 4.22 - 5.81 MIL/uL   Hemoglobin 11.9 (L) 13.0 - 17.0 g/dL   HCT 37.9 (L) 39.0 - 52.0 %   MCV 78.3 (L) 80.0 - 100.0 fL   MCH 24.6 (L) 26.0 - 34.0 pg   MCHC 31.4 30.0 - 36.0 g/dL   RDW 19.0 (H) 11.5 - 15.5 %   Platelets 210 150 - 400 K/uL   nRBC 0.0 0.0 - 0.2 %   Neutrophils Relative % 30 %   Neutro Abs 1.2 (L) 1.7 - 7.7 K/uL   Lymphocytes Relative 50 %   Lymphs Abs 2.0 0.7 - 4.0 K/uL   Monocytes Relative 11 %   Monocytes Absolute 0.5 0.1 - 1.0 K/uL   Eosinophils Relative 5 %   Eosinophils Absolute 0.2 0.0 - 0.5 K/uL   Basophils Relative 2 %   Basophils Absolute 0.1 0.0 - 0.1 K/uL   Immature Granulocytes 2 %   Abs Immature Granulocytes 0.07 0.00 - 0.07 K/uL  Comprehensive metabolic panel     Status: Abnormal   Collection Time: 11/02/18  7:30 AM  Result Value Ref Range   Sodium 143 135 - 145 mmol/L   Potassium 3.9 3.5 - 5.1 mmol/L   Chloride 103 98 - 111 mmol/L   CO2 31 22 - 32 mmol/L   Glucose, Bld 84 70 - 99 mg/dL   BUN 23 8 - 23 mg/dL   Creatinine, Ser 1.04 0.61 - 1.24 mg/dL   Calcium 9.3 8.9 - 10.3 mg/dL  Total Protein 8.4 (H) 6.5 - 8.1 g/dL   Albumin 4.1 3.5 - 5.0 g/dL   AST 27 15 - 41 U/L   ALT 25 0 - 44 U/L   Alkaline Phosphatase 65 38 - 126 U/L   Total Bilirubin 0.5 0.3 - 1.2 mg/dL   GFR calc non Af Amer >60 >60 mL/min   GFR calc Af Amer >60 >60 mL/min   Anion gap 9 5 - 15  Glucose,  capillary     Status: Abnormal   Collection Time: 11/02/18  8:01 AM  Result Value Ref Range   Glucose-Capillary 69 (L) 70 - 99 mg/dL  Glucose, capillary     Status: Abnormal   Collection Time: 11/02/18 10:44 AM  Result Value Ref Range   Glucose-Capillary 179 (H) 70 - 99 mg/dL   ____________________________________________  EKG My review and personal interpretation at Time: 7:33   Indication: fall  Rate: 65  Rhythm: afib Axis: left Other: normal intervals, poor r wave progression, occasional pvc no stemi ____________________________________________  RADIOLOGY  I personally reviewed all radiographic images ordered to evaluate for the above acute complaints and reviewed radiology reports and findings.  These findings were personally discussed with the patient.  Please see medical record for radiology report.  ____________________________________________   PROCEDURES  Procedure(s) performed:  Procedures    Critical Care performed: no ____________________________________________   INITIAL IMPRESSION / ASSESSMENT AND PLAN / ED COURSE  Pertinent labs & imaging results that were available during my care of the patient were reviewed by me and considered in my medical decision making (see chart for details).   DDX: fracture, dislocation, contusion, sprain, laceration  DMITRI PETTIGREW is a 68 y.o. who presents to the ED with fall with right knee pain as described above.  Patient morbidly obese but his mobility is from a wheelchair.  X-ray as well as basic blood work will be ordered to evaluate for above differential.  Is otherwise without evidence of traumatic injury.    Clinical Course as of Nov 02 1534  Fri Nov 02, 2018  0903 Patient eating.  Blood work is at baseline.  No evidence of fracture.  Likely contusion.  Patient able to cross his legs and pain is minimal at this time.  Uses wheelchair for mobility.  I believe he stable and appropriate for outpatient follow-up.   [PR]     Clinical Course User Index [PR] Merlyn Lot, MD    The patient was evaluated in Emergency Department today for the symptoms described in the history of present illness. He/she was evaluated in the context of the global COVID-19 pandemic, which necessitated consideration that the patient might be at risk for infection with the SARS-CoV-2 virus that causes COVID-19. Institutional protocols and algorithms that pertain to the evaluation of patients at risk for COVID-19 are in a state of rapid change based on information released by regulatory bodies including the CDC and federal and state organizations. These policies and algorithms were followed during the patient's care in the ED.  As part of my medical decision making, I reviewed the following data within the [place] notes reviewed and incorporated, Labs reviewed, notes from prior ED visits and [place] Controlled Substance Database   ____________________________________________   FINAL CLINICAL IMPRESSION(S) / ED DIAGNOSES  Final diagnoses:  Acute pain of right knee      NEW MEDICATIONS STARTED DURING THIS VISIT:  Discharge Medication List as of 11/02/2018  9:27 AM  Note:  This document was prepared using Dragon voice recognition software and may include unintentional dictation errors.    Merlyn Lot, MD 11/02/18 1536

## 2018-11-05 NOTE — Telephone Encounter (Signed)
appt was made by beth

## 2018-11-07 DIAGNOSIS — G8929 Other chronic pain: Secondary | ICD-10-CM | POA: Diagnosis not present

## 2018-11-07 DIAGNOSIS — R269 Unspecified abnormalities of gait and mobility: Secondary | ICD-10-CM | POA: Diagnosis not present

## 2018-11-07 DIAGNOSIS — M1731 Unilateral post-traumatic osteoarthritis, right knee: Secondary | ICD-10-CM | POA: Diagnosis not present

## 2018-11-07 DIAGNOSIS — M25561 Pain in right knee: Secondary | ICD-10-CM | POA: Diagnosis not present

## 2018-11-07 DIAGNOSIS — M1712 Unilateral primary osteoarthritis, left knee: Secondary | ICD-10-CM | POA: Diagnosis not present

## 2018-11-09 DIAGNOSIS — W19XXXD Unspecified fall, subsequent encounter: Secondary | ICD-10-CM | POA: Diagnosis not present

## 2018-11-09 DIAGNOSIS — Z8673 Personal history of transient ischemic attack (TIA), and cerebral infarction without residual deficits: Secondary | ICD-10-CM | POA: Diagnosis not present

## 2018-11-09 DIAGNOSIS — S0083XD Contusion of other part of head, subsequent encounter: Secondary | ICD-10-CM | POA: Diagnosis not present

## 2018-11-09 DIAGNOSIS — I70203 Unspecified atherosclerosis of native arteries of extremities, bilateral legs: Secondary | ICD-10-CM | POA: Diagnosis not present

## 2018-11-09 DIAGNOSIS — E1151 Type 2 diabetes mellitus with diabetic peripheral angiopathy without gangrene: Secondary | ICD-10-CM | POA: Diagnosis not present

## 2018-11-09 DIAGNOSIS — I110 Hypertensive heart disease with heart failure: Secondary | ICD-10-CM | POA: Diagnosis not present

## 2018-11-09 DIAGNOSIS — Z87891 Personal history of nicotine dependence: Secondary | ICD-10-CM | POA: Diagnosis not present

## 2018-11-09 DIAGNOSIS — I5032 Chronic diastolic (congestive) heart failure: Secondary | ICD-10-CM | POA: Diagnosis not present

## 2018-11-09 DIAGNOSIS — Z89412 Acquired absence of left great toe: Secondary | ICD-10-CM | POA: Diagnosis not present

## 2018-11-19 ENCOUNTER — Ambulatory Visit: Payer: Self-pay | Admitting: Adult Health

## 2018-11-22 ENCOUNTER — Telehealth: Payer: Self-pay

## 2018-11-22 DIAGNOSIS — I5032 Chronic diastolic (congestive) heart failure: Secondary | ICD-10-CM | POA: Diagnosis not present

## 2018-11-22 DIAGNOSIS — I70203 Unspecified atherosclerosis of native arteries of extremities, bilateral legs: Secondary | ICD-10-CM | POA: Diagnosis not present

## 2018-11-22 DIAGNOSIS — W19XXXD Unspecified fall, subsequent encounter: Secondary | ICD-10-CM | POA: Diagnosis not present

## 2018-11-22 DIAGNOSIS — I110 Hypertensive heart disease with heart failure: Secondary | ICD-10-CM | POA: Diagnosis not present

## 2018-11-22 DIAGNOSIS — Z89412 Acquired absence of left great toe: Secondary | ICD-10-CM | POA: Diagnosis not present

## 2018-11-22 DIAGNOSIS — E1151 Type 2 diabetes mellitus with diabetic peripheral angiopathy without gangrene: Secondary | ICD-10-CM | POA: Diagnosis not present

## 2018-11-22 DIAGNOSIS — Z8673 Personal history of transient ischemic attack (TIA), and cerebral infarction without residual deficits: Secondary | ICD-10-CM | POA: Diagnosis not present

## 2018-11-22 DIAGNOSIS — S0083XD Contusion of other part of head, subsequent encounter: Secondary | ICD-10-CM | POA: Diagnosis not present

## 2018-11-22 DIAGNOSIS — Z87891 Personal history of nicotine dependence: Secondary | ICD-10-CM | POA: Diagnosis not present

## 2018-11-22 NOTE — Telephone Encounter (Signed)
[facility] nurse sherry miles [phone] called pt fall on Monday and injured his large toe was swelling advised nurse he need to been seen and he call back and make tommorow appt

## 2018-11-26 ENCOUNTER — Encounter: Payer: Self-pay | Admitting: Adult Health

## 2018-11-26 ENCOUNTER — Telehealth: Payer: Self-pay

## 2018-11-26 ENCOUNTER — Other Ambulatory Visit: Payer: Self-pay

## 2018-11-26 ENCOUNTER — Ambulatory Visit (INDEPENDENT_AMBULATORY_CARE_PROVIDER_SITE_OTHER): Payer: Medicare HMO | Admitting: Adult Health

## 2018-11-26 VITALS — BP 128/82 | HR 67 | Resp 16 | Ht 68.0 in | Wt 299.0 lb

## 2018-11-26 DIAGNOSIS — I10 Essential (primary) hypertension: Secondary | ICD-10-CM

## 2018-11-26 DIAGNOSIS — E1165 Type 2 diabetes mellitus with hyperglycemia: Secondary | ICD-10-CM | POA: Diagnosis not present

## 2018-11-26 DIAGNOSIS — W19XXXD Unspecified fall, subsequent encounter: Secondary | ICD-10-CM

## 2018-11-26 MED ORDER — BYDUREON 2 MG ~~LOC~~ PEN: 2.0000 mg | PEN_INJECTOR | SUBCUTANEOUS | 5 refills | Status: DC

## 2018-11-26 MED ORDER — LANTUS SOLOSTAR 100 UNIT/ML ~~LOC~~ SOPN: 71.0000 [IU] | PEN_INJECTOR | Freq: Every day | SUBCUTANEOUS | 6 refills | Status: DC

## 2018-11-26 NOTE — Progress Notes (Signed)
[facility] [place], [place] [address]  Internal MEDICINE  Office Visit Note  Patient Name: Victor Wise  [id number]  [id number]  Date of Service: 11/26/2018  Chief Complaint  Patient presents with  . Fall    fell out of bed , wheelchair moved backwards , fell and hit head last week  . Diabetes    needs assistance to help pay for his lantus and bydureon     HPI Pt is here for a sick visit. Pt is here reporting continued incidence of fall. He has had 2 falls in the last 7 days.  Each time he was luckily not seriously injured.  He reports he went to ortho a few weeks ago for joint injections into his knees. He reports he received cortisone injections with no relief.  Pt reports his knees continue to "give out" on him intermittently.  He denies hitting his head, dizziness, or LOC.  He also reports his RX for bydureon and lantus need to be renewed so that he can continue with his medication assistance.        Current Medication:  Outpatient Encounter Medications as of 11/26/2018  Medication Sig Note  . acetaminophen (TYLENOL) 325 MG tablet Take 2 tablets (650 mg total) by mouth every 6 (six) hours as needed for mild pain (or Fever >/= 101). 06/04/2018: PRN  . amLODipine (NORVASC) 10 MG tablet TAKE 1 TABLET BY MOUTH ONCE DAILY (Patient taking differently: Take 10 mg by mouth daily. )   . aspirin EC 81 MG tablet Take 81 mg by mouth daily.   . benazepril (LOTENSIN) 20 MG tablet Take 1 tablet (20 mg total) by mouth 2 (two) times daily.   . clopidogrel (PLAVIX) 75 MG tablet Take 1 tablet (75 mg total) by mouth daily.   . DULoxetine (CYMBALTA) 60 MG capsule Take 1 capsule (60 mg total) by mouth daily.   . Exenatide ER (BYDUREON) 2 MG PEN Inject 2 mg into the skin once a week. 10/06/2018: Every Friday  . ezetimibe (ZETIA) 10 MG tablet Take by mouth.   . fenofibrate 54 MG tablet TAKE 1 TABLET BY MOUTH A DAY AT SUPPERTIME   . fluticasone (FLONASE) 50 MCG/ACT nasal  spray Place 2 sprays into both nostrils daily.   . furosemide (LASIX) 20 MG tablet Take 20 mg by mouth daily.   [name] gemfibrozil (LOPID) 600 MG tablet Take 1 tablet (600 mg total) by mouth 2 (two) times daily before a meal.   . glucose blood (TRUE METRIX BLOOD GLUCOSE TEST) test strip Use as instructed three times a day diag e11.65   . hydrochlorothiazide (HYDRODIURIL) 12.5 MG tablet Take 1 tablet (12.5 mg total) by mouth daily.   . Insulin Pen Needle (B-D UF III MINI PEN NEEDLES) 31G X 5 MM MISC Use as directed with insulin e11.65   . insulin regular (NOVOLIN R) 100 units/mL injection Patient to use Novolin R [place] TiD prior to meals per sliding scale instructions. Max daily dose is 45 units per day.   . INSULIN SYRINGE 1CC/29G 29G X 1/2" 1 ML MISC Insulin injections QID, use with lantus and regular insulin. DX E11.65   . Insulin Syringe-Needle U-100 (BD INSULIN SYRINGE U/F 1/2UNIT) 31G X 5/16" 0.3 ML MISC Indulin injection QID. Use with lantus and regular insulin Dx. 11.65   . LANTUS SOLOSTAR 100 UNIT/ML Solostar Pen INJECT 70 UNITS SUBQ ONCE DAILY   . NARCAN 4 MG/0.1ML LIQD Take 4 mg by mouth as  needed (for opioid overdose).    . nitroGLYCERIN (NITROSTAT) 0.4 MG SL tablet Place 1 tablet (0.4 mg total) under the tongue every 5 (five) minutes x 3 doses as needed for chest pain.   [name] omega-3 acid ethyl esters (LOVAZA) 1 g capsule    . Oxcarbazepine (TRILEPTAL) 300 MG tablet Take 300-600 mg by mouth See admin instructions. Take 1 tablet (300MG ) by mouth every morning and 2 tablets (600MG ) by mouth every night   . oxyCODONE (OXY IR/ROXICODONE) 5 MG immediate release tablet Take 1 tablet (5 mg total) by mouth every 4 (four) hours as needed for moderate pain.   [name] oxyCODONE (OXYCONTIN) 10 mg 12 hr tablet Take 1 tablet (10 mg total) by mouth every 12 (twelve) hours.   [name] oxyCODONE (OXYCONTIN) 20 mg 12 hr tablet Take by mouth.   . pantoprazole (PROTONIX) 40 MG tablet TAKE 1 TABLET BY MOUTH A DAY FOR REFLUX   .  polyethylene glycol (MIRALAX / GLYCOLAX) packet Take 17 g by mouth daily as needed. 06/04/2018: PRN  . pregabalin (LYRICA) 200 MG capsule Take 200 mg by mouth See admin instructions. Take 2 capsules (400MG ) by mouth every morning and 1 capsule (200MG ) by mouth every night   . tamsulosin (FLOMAX) 0.4 MG CAPS capsule Take 1 capsule (0.4 mg total) by mouth daily after supper.    No facility-administered encounter medications on file as of 11/26/2018.       Medical History: Past Medical History:  Diagnosis Date  . Allergy   . Basal cell carcinoma    forehead  . BPH (benign prostatic hyperplasia)   . Chronic back pain   . Depression   . Diabetes ([place])   . GERD (gastroesophageal reflux disease)   . Heart attack ([place])   . Hypertension   . Morbid obesity ([place])   . Obstructive sleep apnea   . 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 Status post insertion of spinal cord stimulator   . Stroke ([place])   . UTI (lower urinary tract infection)      Vital Signs: BP 128/82   Pulse 67   Resp 16   Ht 5\' 8"  (1.727 m)   Wt 299 lb (135.6 kg)   SpO2 97%   BMI 45.46 kg/m    Review of Systems  Constitutional: Negative.  Negative for chills, fatigue and unexpected weight change.  HENT: Negative.  Negative for congestion, rhinorrhea, sneezing and sore throat.   Eyes: Negative for redness.  Respiratory: Negative.  Negative for cough, chest tightness and shortness of breath.   Cardiovascular: Negative.  Negative for chest pain and palpitations.  Gastrointestinal: Negative.  Negative for abdominal pain, constipation, diarrhea, nausea and vomiting.  Endocrine: Negative.   Genitourinary: Negative.  Negative for dysuria and frequency.  Musculoskeletal: Negative.  Negative for arthralgias, back pain, joint swelling and neck pain.  Skin: Negative.  Negative for rash.  Allergic/Immunologic: Negative.   Neurological: Negative.  Negative for tremors and numbness.  Hematological: Negative for adenopathy. Does not  bruise/bleed easily.  Psychiatric/Behavioral: Negative.  Negative for behavioral problems, sleep disturbance and suicidal ideas. The patient is not nervous/anxious.     Physical Exam Vitals signs and nursing note reviewed.  Constitutional:      General: He is not in acute distress.    Appearance: He is well-developed. He is not diaphoretic.  HENT:     Head: Normocephalic and atraumatic.     Mouth/Throat:     Pharynx: No oropharyngeal exudate.  Eyes:  Pupils: Pupils are equal, round, and reactive to light.  Neck:     Musculoskeletal: Normal range of motion and neck supple.     Thyroid: No thyromegaly.     Vascular: No JVD.     Trachea: No tracheal deviation.  Cardiovascular:     Rate and Rhythm: Normal rate and regular rhythm.     Heart sounds: Normal heart sounds. No murmur. No friction rub. No gallop.   Pulmonary:     Effort: Pulmonary effort is normal. No respiratory distress.     Breath sounds: Normal breath sounds. No wheezing or rales.  Chest:     Chest wall: No tenderness.  Abdominal:     Palpations: Abdomen is soft.     Tenderness: There is no abdominal tenderness. There is no guarding.  Musculoskeletal: Normal range of motion.  Lymphadenopathy:     Cervical: No cervical adenopathy.  Skin:    General: Skin is warm and dry.  Neurological:     Mental Status: He is alert and oriented to person, place, and time.     Cranial Nerves: No cranial nerve deficit.  Psychiatric:        Behavior: Behavior normal.        Thought Content: Thought content normal.        Judgment: Judgment normal.    Assessment/Plan: 1. Fall from standing, subsequent encounter PT has had multiple falls including one in our parking lot today.  He is typically uninjured. We discussed that he should not be walking alone at this time, and especially not driving.  He   2. Uncontrolled type 2 diabetes mellitus with hyperglycemia ([place]) Refilled patients Bydureon at this visit.  - Exenatide ER  (BYDUREON) 2 MG PEN; Inject 2 mg into the skin once a week.  Dispense: 4 each; Refill: 5  3. Essential hypertension Stable, continue present management.  4. Obesity, Class III, BMI 40-49.9 (morbid obesity) (HCC) Obesity Counseling: Risk Assessment: An assessment of behavioral risk factors was made today and includes lack of exercise sedentary lifestyle, lack of portion control and poor dietary habits.  Risk Modification Advice: She was counseled on portion control guidelines. Restricting daily caloric intake to. . The detrimental long term effects of obesity on her health and ongoing poor compliance was also discussed with the patient.    General Counseling: amarion portell understanding of the findings of todays visit and agrees with plan of treatment. I have discussed any further diagnostic evaluation that may be needed or ordered today. We also reviewed his medications today. he has been encouraged to call the office with any questions or concerns that should arise related to todays visit.   No orders of the defined types were placed in this encounter.   No orders of the defined types were placed in this encounter.   Time spent: 20 Minutes  This patient was seen by Orson Gear AGNP-C in Collaboration with Dr Lavera Guise as a part of collaborative care agreement.  Kendell Bane AGNP-C Internal Medicine

## 2018-11-26 NOTE — Telephone Encounter (Signed)
I called pt to follow up and make sure he arrived home safely. He said he was home safe, he used his wheelchair to get into the house. I asked him how is was feeling, He said fine. I advised him to give [place] a call if he needs [place]. He said he would and he thanked me for checking on him

## 2018-11-26 NOTE — Telephone Encounter (Signed)
Pt fell in parking lot after leaving his appt. Victor Wise and I tried twice to help pt up into car and  Were unable to, we safely lowered him to the ground and stayed with him until ambulance came. 2 firemen were able to successfully lift him and position him in the car correctly. He did not LOC only had a scrape on Lower left leg that we bandaged up once in the car. Pt refused multiple times to be taken to the hospital via ambulance. I advised pt to call me when he got inside home safely. We checked pts bg was 232 and bp was 107/62.

## 2018-11-29 DIAGNOSIS — E1151 Type 2 diabetes mellitus with diabetic peripheral angiopathy without gangrene: Secondary | ICD-10-CM | POA: Diagnosis not present

## 2018-11-29 DIAGNOSIS — Z89411 Acquired absence of right great toe: Secondary | ICD-10-CM | POA: Diagnosis not present

## 2018-11-29 DIAGNOSIS — B351 Tinea unguium: Secondary | ICD-10-CM | POA: Diagnosis not present

## 2018-11-29 DIAGNOSIS — Z89412 Acquired absence of left great toe: Secondary | ICD-10-CM | POA: Diagnosis not present

## 2018-12-03 ENCOUNTER — Other Ambulatory Visit: Payer: Self-pay

## 2018-12-03 ENCOUNTER — Other Ambulatory Visit: Payer: Self-pay | Admitting: Adult Health

## 2018-12-03 MED ORDER — FENOFIBRATE 54 MG PO TABS: ORAL_TABLET | ORAL | 5 refills | Status: DC

## 2018-12-05 DIAGNOSIS — Z8673 Personal history of transient ischemic attack (TIA), and cerebral infarction without residual deficits: Secondary | ICD-10-CM | POA: Diagnosis not present

## 2018-12-05 DIAGNOSIS — Z87891 Personal history of nicotine dependence: Secondary | ICD-10-CM | POA: Diagnosis not present

## 2018-12-05 DIAGNOSIS — I70203 Unspecified atherosclerosis of native arteries of extremities, bilateral legs: Secondary | ICD-10-CM | POA: Diagnosis not present

## 2018-12-05 DIAGNOSIS — S0083XD Contusion of other part of head, subsequent encounter: Secondary | ICD-10-CM | POA: Diagnosis not present

## 2018-12-05 DIAGNOSIS — Z89412 Acquired absence of left great toe: Secondary | ICD-10-CM | POA: Diagnosis not present

## 2018-12-05 DIAGNOSIS — E1151 Type 2 diabetes mellitus with diabetic peripheral angiopathy without gangrene: Secondary | ICD-10-CM | POA: Diagnosis not present

## 2018-12-05 DIAGNOSIS — W19XXXD Unspecified fall, subsequent encounter: Secondary | ICD-10-CM | POA: Diagnosis not present

## 2018-12-05 DIAGNOSIS — I5032 Chronic diastolic (congestive) heart failure: Secondary | ICD-10-CM | POA: Diagnosis not present

## 2018-12-05 DIAGNOSIS — I110 Hypertensive heart disease with heart failure: Secondary | ICD-10-CM | POA: Diagnosis not present

## 2018-12-07 DIAGNOSIS — R269 Unspecified abnormalities of gait and mobility: Secondary | ICD-10-CM | POA: Diagnosis not present

## 2018-12-12 ENCOUNTER — Other Ambulatory Visit: Payer: Self-pay

## 2018-12-12 MED ORDER — GEMFIBROZIL 600 MG PO TABS: 600.0000 mg | ORAL_TABLET | Freq: Two times a day (BID) | ORAL | 2 refills | Status: DC

## 2019-01-03 ENCOUNTER — Emergency Department
Admission: EM | Admit: 2019-01-03 | Discharge: 2019-01-03 | Disposition: A | Discharge status: Home / Self Care | Payer: Medicare HMO | Attending: Emergency Medicine | Admitting: Emergency Medicine

## 2019-01-03 ENCOUNTER — Other Ambulatory Visit: Payer: Self-pay

## 2019-01-03 ENCOUNTER — Emergency Department: Payer: Medicare HMO

## 2019-01-03 ENCOUNTER — Encounter: Payer: Self-pay | Admitting: Emergency Medicine

## 2019-01-03 DIAGNOSIS — I2510 Atherosclerotic heart disease of native coronary artery without angina pectoris: Secondary | ICD-10-CM | POA: Insufficient documentation

## 2019-01-03 DIAGNOSIS — E119 Type 2 diabetes mellitus without complications: Secondary | ICD-10-CM | POA: Insufficient documentation

## 2019-01-03 DIAGNOSIS — S82832A Other fracture of upper and lower end of left fibula, initial encounter for closed fracture: Secondary | ICD-10-CM | POA: Diagnosis not present

## 2019-01-03 DIAGNOSIS — Y999 Unspecified external cause status: Secondary | ICD-10-CM | POA: Insufficient documentation

## 2019-01-03 DIAGNOSIS — Z7982 Long term (current) use of aspirin: Secondary | ICD-10-CM | POA: Diagnosis not present

## 2019-01-03 DIAGNOSIS — S82842A Displaced bimalleolar fracture of left lower leg, initial encounter for closed fracture: Secondary | ICD-10-CM | POA: Diagnosis not present

## 2019-01-03 DIAGNOSIS — Y9389 Activity, other specified: Secondary | ICD-10-CM | POA: Diagnosis not present

## 2019-01-03 DIAGNOSIS — W050XXA Fall from non-moving wheelchair, initial encounter: Secondary | ICD-10-CM | POA: Insufficient documentation

## 2019-01-03 DIAGNOSIS — Z79899 Other long term (current) drug therapy: Secondary | ICD-10-CM | POA: Diagnosis not present

## 2019-01-03 DIAGNOSIS — Z87891 Personal history of nicotine dependence: Secondary | ICD-10-CM | POA: Insufficient documentation

## 2019-01-03 DIAGNOSIS — Z794 Long term (current) use of insulin: Secondary | ICD-10-CM | POA: Insufficient documentation

## 2019-01-03 DIAGNOSIS — R609 Edema, unspecified: Secondary | ICD-10-CM | POA: Diagnosis not present

## 2019-01-03 DIAGNOSIS — S8252XA Displaced fracture of medial malleolus of left tibia, initial encounter for closed fracture: Secondary | ICD-10-CM | POA: Diagnosis not present

## 2019-01-03 DIAGNOSIS — W19XXXA Unspecified fall, initial encounter: Secondary | ICD-10-CM | POA: Diagnosis not present

## 2019-01-03 DIAGNOSIS — S99912A Unspecified injury of left ankle, initial encounter: Secondary | ICD-10-CM | POA: Diagnosis present

## 2019-01-03 DIAGNOSIS — Y929 Unspecified place or not applicable: Secondary | ICD-10-CM | POA: Insufficient documentation

## 2019-01-03 DIAGNOSIS — R52 Pain, unspecified: Secondary | ICD-10-CM | POA: Diagnosis not present

## 2019-01-03 DIAGNOSIS — S82402A Unspecified fracture of shaft of left fibula, initial encounter for closed fracture: Secondary | ICD-10-CM | POA: Diagnosis not present

## 2019-01-03 DIAGNOSIS — I10 Essential (primary) hypertension: Secondary | ICD-10-CM | POA: Diagnosis not present

## 2019-01-03 DIAGNOSIS — Y92009 Unspecified place in unspecified non-institutional (private) residence as the place of occurrence of the external cause: Secondary | ICD-10-CM

## 2019-01-03 NOTE — ED Notes (Signed)
Pt resting in bed, provider at bedside, ankle brace removed by this RN and provider, Xray at bedside. NAD.

## 2019-01-03 NOTE — Discharge Instructions (Signed)
Call and make an appointment with Dr. Mack Guise who is the orthopedist on call.  His office information and phone numbers listed on your discharge papers.  Ice and elevation when sitting.  Do not bear weight on your left leg.  You should be in your wheelchair or using your walker.  Continue taking your regular pain medication as prescribed by your doctor.

## 2019-01-03 NOTE — ED Notes (Signed)
Pt states his daughter will come and pick him up. He has a w/c at home and a ramp

## 2019-01-03 NOTE — ED Triage Notes (Signed)
Pt to ED via EMS for left ankle pain and fall this past Monday.  Patient states while transferring from wheelchair right leg gave out causing him to twist left ankle and fall.  Did not seek treatment then thinking pain would resolve, pain worse now.  Pt has brace in place from home, (+) movement in toes and skin warm and dry.

## 2019-01-03 NOTE — ED Provider Notes (Signed)
[facility] Emergency Department Provider Note   ____________________________________________   First MD Initiated Contact with Patient 01/03/19 1046     (approximate)  I have reviewed the triage vital signs and the nursing notes.   HISTORY  Chief Complaint Ankle Pain and Fall   HPI Victor Wise is a 68 y.o. male presents to the ED via EMS with complaint of left leg pain.  Patient states that he fell Monday (4 days ago) transferring from his wheelchair when his right leg gave out causing him to twist his ankle and fall.  Patient denies any head injury or other injuries.  Patient lives at home and uses a walker or wheelchair because of issues that he has with his right knee.  He states that there are family members who check on him daily.  Patient currently takes OxyContin for chronic pain.  Currently rates his pain as 9/10.       Past Medical History:  Diagnosis Date  . Allergy   . Basal cell carcinoma    forehead  . BPH (benign prostatic hyperplasia)   . Chronic back pain   . Depression   . Diabetes ([place])   . GERD (gastroesophageal reflux disease)   . Heart attack ([place])   . Hypertension   . Morbid obesity ([place])   . Obstructive sleep apnea   . 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 Status post insertion of spinal cord stimulator   . Stroke ([place])   . UTI (lower urinary tract infection)     Patient Active Problem List   Diagnosis Date Noted  . Bilateral leg edema 08/20/2018  . Diabetic ulcer of left foot ([place]) 04/18/2018  . Swelling of limb 04/06/2018  . Atherosclerosis of native arteries of the extremities with ulceration ([place]) 04/06/2018  . Edema of lower extremity 04/04/2018  . Diabetic foot infection ([place]) 03/01/2018  . Spinal cord stimulator status 01/12/2018  . Essential hypertension 09/05/2017  . OSA on CPAP 09/05/2017  . Uncontrolled type 2 diabetes mellitus with hyperglycemia ([place]) 08/02/2017  . Peripheral vascular disease ([place])  08/02/2017  . Mixed hyperlipidemia 08/02/2017  . Dysuria 08/02/2017  . Obesity, Class III, BMI 40-49.9 (morbid obesity) ([place]) 08/10/2016  . Hemiparesis affecting left side as late effect of stroke ([place]) 05/09/2016  . Symptomatic bradycardia 03/21/2016  . Falls frequently 02/26/2016  . Acute renal failure (ARF) ([place]) 02/18/2016  . Hypotension 02/18/2016  . Sick sinus syndrome ([place]) 10/28/2015  . Syncopal episodes 10/26/2015  . Syncope 10/26/2015  . Adjustment disorder with mixed anxiety and depressed mood 10/20/2015  . Dysthymia 10/20/2015  . CVA (cerebral infarction) 10/19/2015  . Urinary retention 03/25/2015  . 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 CAD (coronary artery disease) 12/29/2014  . Acute MI, anterolateral wall, subsequent episode of care ([place]) 12/19/2014  . SIRS (systemic inflammatory response syndrome) ([place]) 12/18/2014  . Systemic inflammatory response syndrome (sirs) of non-infectious origin without acute organ dysfunction ([place]) 12/18/2014  . Encounter for monitoring opioid maintenance therapy 11/10/2014  . LVH (left ventricular hypertrophy) due to hypertensive disease, with heart failure ([place]) 10/02/2014  . Benign essential hypertension 09/26/2014  . Chronic diastolic CHF (congestive heart failure), NYHA class 2 ([place]) 06/03/2014  . Pain syndrome, chronic 03/02/2014  . Diabetes ([place]) 11/08/2013  . Chronic radicular low back pain 08/21/2013  . Right shoulder pain 04/03/2013  . Urinary tract infection 03/16/2012  . Balanoposthitis 02/13/2012  . History of urinary stone 02/13/2012  . Hypertrophy of prostate with urinary  obstruction and other lower urinary tract symptoms (LUTS) 02/13/2012  . 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 Redundant prepuce and phimosis 02/13/2012  . Urge incontinence 02/13/2012  . Right foot pain 01/18/2012  . Chronic, continuous use of opioids 08/22/2011    Past Surgical History:  Procedure Laterality Date  . AMPUTATION TOE Left 04/19/2018    Procedure: AMPUTATION TOE-LEFT GREAT TOE;  Surgeon: Sharlotte Alamo, DPM;  Location: [facility] ORS;  Service: Podiatry;  Laterality: Left;  . CARDIAC CATHETERIZATION N/A 12/19/2014   Procedure: Coronary Stent Intervention;  Surgeon: Charolette Forward, MD;  Location: [place] [facility];  Service: Cardiovascular;  Laterality: N/A;  . CARDIAC CATHETERIZATION Left 12/19/2014   Procedure: Left Heart Cath and Coronary Angiography;  Surgeon: Dionisio Kassius, MD;  Location: [place] [facility];  Service: Cardiovascular;  Laterality: Left;  . IRRIGATION AND DEBRIDEMENT FOOT Left 03/02/2018   Procedure: IRRIGATION AND DEBRIDEMENT FOOT;  Surgeon: Samara Deist, DPM;  Location: [facility] ORS;  Service: Podiatry;  Laterality: Left;  . IRRIGATION AND DEBRIDEMENT FOOT Left 03/08/2018   Procedure: IRRIGATION AND DEBRIDEMENT FOOT AND BONE;  Surgeon: Sharlotte Alamo, DPM;  Location: [facility];  Service: Podiatry;  Laterality: Left;  . IRRIGATION AND DEBRIDEMENT FOOT Left 04/19/2018   Procedure: IRRIGATION AND DEBRIDEMENT FOOT;  Surgeon: Sharlotte Alamo, DPM;  Location: [facility];  Service: Podiatry;  Laterality: Left;  . LOWER EXTREMITY ANGIOGRAPHY Left 03/05/2018   Procedure: Lower Extremity Angiography;  Surgeon: Algernon Huxley, MD;  Location: [place] [facility];  Service: Cardiovascular;  Laterality: Left;  . LOWER EXTREMITY ANGIOGRAPHY Left 03/08/2018   Procedure: LOWER EXTREMITY ANGIOGRAPHY;  Surgeon: Algernon Huxley, MD;  Location: [place] [facility];  Service: Cardiovascular;  Laterality: Left;  . PACEMAKER INSERTION Left 11/02/2015   Procedure: INSERTION PACEMAKER;  Surgeon: Isaias Cowman, MD;  Location: [facility];  Service: Cardiovascular;  Laterality: Left;  . Pain Stimulator    . Right toe amputation      Prior to Admission medications   Medication Sig Start Date End Date Taking? Authorizing Provider  acetaminophen (TYLENOL) 325 MG tablet Take 2 tablets (650 mg total) by mouth every 6 (six) hours as needed for mild pain (or  Fever >/= 101). 04/23/18   Gouru, Illene Silver, MD  amLODipine (NORVASC) 10 MG tablet TAKE 1 TABLET BY MOUTH ONCE DAILY 12/03/18   Kendell Bane, NP  aspirin EC 81 MG tablet Take 81 mg by mouth daily.    [provider]  benazepril (LOTENSIN) 20 MG tablet Take 1 tablet (20 mg total) by mouth 2 (two) times daily. 10/01/18   Kendell Bane, NP  clopidogrel (PLAVIX) 75 MG tablet Take 1 tablet (75 mg total) by mouth daily. 06/28/18   Ronnell Freshwater, NP  DULoxetine (CYMBALTA) 60 MG capsule Take 1 capsule (60 mg total) by mouth daily. 03/13/18   Salary, Avel Peace, MD  Exenatide ER (BYDUREON) 2 MG PEN Inject 2 mg into the skin once a week. 11/26/18   Kendell Bane, NP  ezetimibe (ZETIA) 10 MG tablet Take by mouth. 10/29/18 10/29/19  [provider]  fenofibrate 54 MG tablet TAKE 1 TABLET BY MOUTH A DAY AT SUPPERTIME 12/03/18   Kendell Bane, NP  fluticasone (FLONASE) 50 MCG/ACT nasal spray Place 2 sprays into both nostrils daily.    [provider]  furosemide (LASIX) 20 MG tablet Take 20 mg by mouth daily. 10/01/18   [provider]  gemfibrozil (LOPID) 600 MG tablet Take 1 tablet (  600 mg total) by mouth 2 (two) times daily before a meal. 12/12/18   [place]ar[place], Audie Clear, NP  glucose blood (TRUE METRIX BLOOD GLUCOSE TEST) test strip Use as instructed three times a day diag e11.65 08/13/18   Ronnell Freshwater, NP  hydrochlorothiazide (HYDRODIURIL) 12.5 MG tablet Take 1 tablet (12.5 mg total) by mouth daily. 08/09/18   Ronnell Freshwater, NP  Insulin Glargine (LANTUS SOLOSTAR) 100 UNIT/ML Solostar Pen Inject 71 Units into the skin at bedtime. 11/26/18   Kendell Bane, NP  Insulin Pen Needle (B-D UF III MINI PEN NEEDLES) 31G X 5 MM MISC Use as directed with insulin e11.65 12/26/17   Ronnell Freshwater, NP  insulin regular (NOVOLIN R) 100 units/mL injection Patient to use Novolin R [place] TiD prior to meals per sliding scale instructions. Max daily dose is 45 units per day. 06/22/18    Kendell Bane, NP  INSULIN SYRINGE 1CC/29G 29G X 1/2" 1 ML MISC Insulin injections QID, use with lantus and regular insulin. DX E11.65 07/05/18   Kendell Bane, NP  Insulin Syringe-Needle U-100 (BD INSULIN SYRINGE U/F 1/2UNIT) 31G X 5/16" 0.3 ML MISC Indulin injection QID. Use with lantus and regular insulin Dx. 11.65 01/08/18   Lavera Guise, MD  NARCAN 4 MG/0.1ML LIQD Take 4 mg by mouth as needed (for opioid overdose).     [provider]  nitroGLYCERIN (NITROSTAT) 0.4 MG SL tablet Place 1 tablet (0.4 mg total) under the tongue every 5 (five) minutes x 3 doses as needed for chest pain. 12/21/14   Dixie Dials, MD  omega-3 acid ethyl esters (LOVAZA) 1 g capsule  10/29/18   [provider]  Oxcarbazepine (TRILEPTAL) 300 MG tablet Take 300-600 mg by mouth See admin instructions. Take 1 tablet (300MG ) by mouth every morning and 2 tablets (600MG ) by mouth every night    [provider]  oxyCODONE (OXY IR/ROXICODONE) 5 MG immediate release tablet Take 1 tablet (5 mg total) by mouth every 4 (four) hours as needed for moderate pain. 04/23/18   Nicholes Mango, MD  oxyCODONE (OXYCONTIN) 10 mg 12 hr tablet Take 1 tablet (10 mg total) by mouth every 12 (twelve) hours. 04/23/18   Nicholes Mango, MD  oxyCODONE (OXYCONTIN) 20 mg 12 hr tablet Take by mouth. 06/09/18   [provider]  pantoprazole (PROTONIX) 40 MG tablet TAKE 1 TABLET BY MOUTH A DAY FOR REFLUX 09/07/18   Kendell Bane, NP  polyethylene glycol (MIRALAX / GLYCOLAX) packet Take 17 g by mouth daily as needed.    [provider]  pregabalin (LYRICA) 200 MG capsule Take 200 mg by mouth See admin instructions. Take 2 capsules (400MG ) by mouth every morning and 1 capsule (200MG ) by mouth every night    [provider]  tamsulosin (FLOMAX) 0.4 MG CAPS capsule Take 1 capsule (0.4 mg total) by mouth daily after supper. 09/07/18   Kendell Bane, NP    Allergies Amoxicillin, Other, Penicillins, Hydralazine,  Cephalexin, Clindamycin, Crestor [rosuvastatin calcium], Metoprolol, Red dye, Statins, Sulfa antibiotics, Yellow dyes (non-tartrazine), Aspirin, and Tape  Family History  Problem Relation Age of Onset  . Breast cancer Mother   . Cancer Mother   . Hypertension Mother   . Lung cancer Father   . Hypertension Father   . Heart disease Father   . Cancer Father   . Kidney disease Sister   . Prostate cancer Neg Hx     Social History Social History  Tobacco Use  . Smoking status: Former Smoker    Types: Cigarettes  . Smokeless tobacco: Never Used  . Tobacco comment: quit 45 years  Substance Use Topics  . Alcohol use: Not Currently    Alcohol/week: 0.0 standard drinks    Comment: have not had alcohol in 24yrs  . Drug use: No    Review of Systems Constitutional: No fever/chills Eyes: No visual changes. ENT: No trauma. Cardiovascular: Denies chest pain. Respiratory: Denies shortness of breath. Gastrointestinal:   No nausea, no vomiting.  Musculoskeletal: Positive left ankle pain. Skin: Negative for rash. Neurological: Negative for headaches, focal weakness or numbness. ____________________________________________   PHYSICAL EXAM:  VITAL SIGNS: ED Triage Vitals  Enc Vitals Group     BP 01/03/19 1046 132/68     Pulse Rate 01/03/19 1046 62     Resp 01/03/19 1046 16     Temp 01/03/19 1046 98.1 F (36.7 C)     Temp Source 01/03/19 1046 Oral     SpO2 01/03/19 1046 94 %     Weight 01/03/19 1047 300 lb (136.1 kg)     Height 01/03/19 1047 5\' 9"  (1.753 m)     Head Circumference --      Peak Flow --      Pain Score 01/03/19 1047 9     Pain Loc --      Pain Edu? --      Excl. in [place]? --    Constitutional: Alert and oriented. Well appearing and in no acute distress.  Patient is talkative and answers questions appropriately. Eyes: Conjunctivae are normal.  Head: Atraumatic. Nose: No trauma. Neck: No stridor.  Nontender cervical spine palpation posteriorly. Cardiovascular:  Normal rate, regular rhythm. Grossly normal heart sounds.  Good peripheral circulation. Respiratory: Normal respiratory effort.  No retractions. Lungs CTAB.  Nontender ribs to palpation. Gastrointestinal: Soft and nontender. No distention.  Musculoskeletal: Moves upper extremities without any difficulty.  On examination of the left ankle there is moderate soft tissue edema present.  Area is tender to palpation especially on the lateral aspect.  No gross deformity.  Range of motion is slightly restricted secondary to discomfort.  Pulses present and skin is intact.  Motor sensory function intact distal to his injury.  Also on palpation there is tenderness noted to the lateral aspect of the proximal fibula.  No tenderness is noted on palpation of the tibia.  Nontender pelvis to compression. Neurologic:  Normal speech and language. No gross focal neurologic deficits are appreciated. No gait instability. Skin:  Skin is warm, dry and intact.  No ecchymosis or abrasions were seen. Psychiatric: Mood and affect are normal. Speech and behavior are normal.  ____________________________________________   LABS (all labs ordered are listed, but only abnormal results are displayed)  Labs Reviewed - No data to display  RADIOLOGY   Official radiology report(s): Dg Tibia/fibula Left  Result Date: 01/03/2019 CLINICAL DATA:  Left ankle pain after fall 3 days ago. EXAM: LEFT TIBIA AND FIBULA - 2 VIEW COMPARISON:  Radiographs of March 01, 2018. FINDINGS: Mildly displaced proximal left fibular shaft fracture is noted. Mildly displaced fracture is seen involving the distal left fibula. Mildly displaced medial malleolar fracture is noted. IMPRESSION: Mildly displaced proximal and distal left fibular shaft fractures. Mildly displaced medial malleolar fracture. Electronically Signed   By: Marijo Conception M.D.   On: 01/03/2019 12:14   Dg Ankle Complete Left  Result Date: 01/03/2019 CLINICAL DATA:  Left ankle pain  after fall 3  days ago. EXAM: LEFT ANKLE COMPLETE - 3+ VIEW COMPARISON:  Radiographs of March 01, 2018. FINDINGS: Mildly displaced oblique fracture is seen involving the distal left fibula. Mildly displaced medial malleolar fracture is noted. Joint spaces intact. No soft tissue abnormality is noted. IMPRESSION: Mildly displaced distal left fibular and medial malleolar fractures. Electronically Signed   By: Marijo Conception M.D.   On: 01/03/2019 12:12    ____________________________________________   PROCEDURES  Procedure(s) performed (including Critical Care):  Procedures  Posterior OCL was placed by RN. ____________________________________________   INITIAL IMPRESSION / ASSESSMENT AND PLAN / ED COURSE  As part of my medical decision making, I reviewed the following data within the electronic MEDICAL RECORD NUMBER Notes from prior ED visits and [place] Controlled Substance Database   68 year old male presents to the ED with complaint of left lower leg pain after he injured the extremity 4 days ago while transferring from his wheelchair.  He states that his right leg "gave out" causing him to twist his ankle and fall.  He denied any head injury or loss of consciousness.  His continue to have left leg pain.  X-rays showed bimalleolar fracture that was mildly displaced along with a proximal fibula fracture.  Dr. Mack Guise was notified and patient was placed in a posterior OCL.  Patient is aware that he is nonweightbearing until he sees the orthopedist.  He is to call the office to make an appointment.  Ice and elevation.  Patient already has pain medication, walker, and wheelchair at home.  ____________________________________________   FINAL CLINICAL IMPRESSION(S) / ED DIAGNOSES  Final diagnoses:  Closed bimalleolar fracture of left ankle, initial encounter  Closed fracture of proximal end of left fibula, unspecified fracture morphology, initial encounter  Fall in home, initial encounter     ED  Discharge Orders    None       Note:  This document was prepared using Dragon voice recognition software and may include unintentional dictation errors.    Johnn Hai, PA-C 01/03/19 1506    Earleen Newport, MD 01/06/19 [phone]

## 2019-01-07 ENCOUNTER — Other Ambulatory Visit: Payer: Self-pay

## 2019-01-07 DIAGNOSIS — R269 Unspecified abnormalities of gait and mobility: Secondary | ICD-10-CM | POA: Diagnosis not present

## 2019-01-07 MED ORDER — LANTUS SOLOSTAR 100 UNIT/ML ~~LOC~~ SOPN: 71.0000 [IU] | PEN_INJECTOR | Freq: Every day | SUBCUTANEOUS | 6 refills | Status: DC

## 2019-01-09 DIAGNOSIS — S42402D Unspecified fracture of lower end of left humerus, subsequent encounter for fracture with routine healing: Secondary | ICD-10-CM | POA: Diagnosis not present

## 2019-01-23 DIAGNOSIS — S82842G Displaced bimalleolar fracture of left lower leg, subsequent encounter for closed fracture with delayed healing: Secondary | ICD-10-CM | POA: Diagnosis not present

## 2019-01-24 ENCOUNTER — Other Ambulatory Visit: Payer: Self-pay

## 2019-01-24 MED ORDER — OMEGA-3-ACID ETHYL ESTERS 1 G PO CAPS: 1.0000 g | ORAL_CAPSULE | Freq: Two times a day (BID) | ORAL | 2 refills | Status: DC

## 2019-01-27 DIAGNOSIS — F329 Major depressive disorder, single episode, unspecified: Secondary | ICD-10-CM | POA: Diagnosis not present

## 2019-01-27 DIAGNOSIS — I509 Heart failure, unspecified: Secondary | ICD-10-CM | POA: Diagnosis not present

## 2019-01-27 DIAGNOSIS — G4730 Sleep apnea, unspecified: Secondary | ICD-10-CM | POA: Diagnosis not present

## 2019-01-27 DIAGNOSIS — S82102D Unspecified fracture of upper end of left tibia, subsequent encounter for closed fracture with routine healing: Secondary | ICD-10-CM | POA: Diagnosis not present

## 2019-01-27 DIAGNOSIS — S82842G Displaced bimalleolar fracture of left lower leg, subsequent encounter for closed fracture with delayed healing: Secondary | ICD-10-CM | POA: Diagnosis not present

## 2019-01-27 DIAGNOSIS — Z7982 Long term (current) use of aspirin: Secondary | ICD-10-CM | POA: Diagnosis not present

## 2019-01-27 DIAGNOSIS — I110 Hypertensive heart disease with heart failure: Secondary | ICD-10-CM | POA: Diagnosis not present

## 2019-01-27 DIAGNOSIS — S82832D Other fracture of upper and lower end of left fibula, subsequent encounter for closed fracture with routine healing: Secondary | ICD-10-CM | POA: Diagnosis not present

## 2019-01-27 DIAGNOSIS — E119 Type 2 diabetes mellitus without complications: Secondary | ICD-10-CM | POA: Diagnosis not present

## 2019-01-28 ENCOUNTER — Other Ambulatory Visit: Payer: Self-pay

## 2019-01-28 ENCOUNTER — Other Ambulatory Visit: Payer: Self-pay | Admitting: Orthopedic Surgery

## 2019-01-28 ENCOUNTER — Other Ambulatory Visit
Admission: RE | Admit: 2019-01-28 | Discharge: 2019-01-28 | Disposition: A | Discharge status: Home / Self Care | Payer: Medicare HMO | Source: Ambulatory Visit | Attending: Orthopedic Surgery | Admitting: Orthopedic Surgery

## 2019-01-28 ENCOUNTER — Ambulatory Visit (INDEPENDENT_AMBULATORY_CARE_PROVIDER_SITE_OTHER): Payer: Medicare HMO | Admitting: Nurse Practitioner

## 2019-01-28 ENCOUNTER — Encounter: Payer: Self-pay | Admitting: Nurse Practitioner

## 2019-01-28 VITALS — BP 101/60 | HR 61 | Resp 16 | Ht 68.0 in | Wt 299.0 lb

## 2019-01-28 DIAGNOSIS — I110 Hypertensive heart disease with heart failure: Secondary | ICD-10-CM | POA: Diagnosis present

## 2019-01-28 DIAGNOSIS — S82842A Displaced bimalleolar fracture of left lower leg, initial encounter for closed fracture: Secondary | ICD-10-CM | POA: Diagnosis present

## 2019-01-28 DIAGNOSIS — I10 Essential (primary) hypertension: Secondary | ICD-10-CM

## 2019-01-28 DIAGNOSIS — W19XXXA Unspecified fall, initial encounter: Secondary | ICD-10-CM | POA: Diagnosis not present

## 2019-01-28 DIAGNOSIS — I252 Old myocardial infarction: Secondary | ICD-10-CM | POA: Diagnosis not present

## 2019-01-28 DIAGNOSIS — Z794 Long term (current) use of insulin: Secondary | ICD-10-CM | POA: Diagnosis not present

## 2019-01-28 DIAGNOSIS — Z7401 Bed confinement status: Secondary | ICD-10-CM | POA: Diagnosis not present

## 2019-01-28 DIAGNOSIS — I5032 Chronic diastolic (congestive) heart failure: Secondary | ICD-10-CM | POA: Diagnosis present

## 2019-01-28 DIAGNOSIS — R4182 Altered mental status, unspecified: Secondary | ICD-10-CM | POA: Diagnosis not present

## 2019-01-28 DIAGNOSIS — I2510 Atherosclerotic heart disease of native coronary artery without angina pectoris: Secondary | ICD-10-CM | POA: Diagnosis present

## 2019-01-28 DIAGNOSIS — M6281 Muscle weakness (generalized): Secondary | ICD-10-CM | POA: Diagnosis not present

## 2019-01-28 DIAGNOSIS — I7025 Atherosclerosis of native arteries of other extremities with ulceration: Secondary | ICD-10-CM | POA: Diagnosis not present

## 2019-01-28 DIAGNOSIS — K219 Gastro-esophageal reflux disease without esophagitis: Secondary | ICD-10-CM | POA: Diagnosis present

## 2019-01-28 DIAGNOSIS — Z87891 Personal history of nicotine dependence: Secondary | ICD-10-CM | POA: Diagnosis not present

## 2019-01-28 DIAGNOSIS — N390 Urinary tract infection, site not specified: Secondary | ICD-10-CM | POA: Diagnosis present

## 2019-01-28 DIAGNOSIS — R300 Dysuria: Secondary | ICD-10-CM

## 2019-01-28 DIAGNOSIS — E1140 Type 2 diabetes mellitus with diabetic neuropathy, unspecified: Secondary | ICD-10-CM | POA: Diagnosis present

## 2019-01-28 DIAGNOSIS — S8262XD Displaced fracture of lateral malleolus of left fibula, subsequent encounter for closed fracture with routine healing: Secondary | ICD-10-CM | POA: Diagnosis not present

## 2019-01-28 DIAGNOSIS — G4733 Obstructive sleep apnea (adult) (pediatric): Secondary | ICD-10-CM | POA: Diagnosis present

## 2019-01-28 DIAGNOSIS — Z881 Allergy status to other antibiotic agents status: Secondary | ICD-10-CM | POA: Diagnosis not present

## 2019-01-28 DIAGNOSIS — Z4789 Encounter for other orthopedic aftercare: Secondary | ICD-10-CM | POA: Diagnosis not present

## 2019-01-28 DIAGNOSIS — G4730 Sleep apnea, unspecified: Secondary | ICD-10-CM | POA: Diagnosis present

## 2019-01-28 DIAGNOSIS — Z803 Family history of malignant neoplasm of breast: Secondary | ICD-10-CM | POA: Diagnosis present

## 2019-01-28 DIAGNOSIS — S82842G Displaced bimalleolar fracture of left lower leg, subsequent encounter for closed fracture with delayed healing: Secondary | ICD-10-CM | POA: Diagnosis not present

## 2019-01-28 DIAGNOSIS — R262 Difficulty in walking, not elsewhere classified: Secondary | ICD-10-CM | POA: Diagnosis not present

## 2019-01-28 DIAGNOSIS — J99 Respiratory disorders in diseases classified elsewhere: Secondary | ICD-10-CM | POA: Diagnosis not present

## 2019-01-28 DIAGNOSIS — I2583 Coronary atherosclerosis due to lipid rich plaque: Secondary | ICD-10-CM | POA: Diagnosis not present

## 2019-01-28 DIAGNOSIS — Z01818 Encounter for other preprocedural examination: Secondary | ICD-10-CM | POA: Diagnosis not present

## 2019-01-28 DIAGNOSIS — Z89422 Acquired absence of other left toe(s): Secondary | ICD-10-CM | POA: Diagnosis not present

## 2019-01-28 DIAGNOSIS — E782 Mixed hyperlipidemia: Secondary | ICD-10-CM | POA: Diagnosis not present

## 2019-01-28 DIAGNOSIS — E785 Hyperlipidemia, unspecified: Secondary | ICD-10-CM | POA: Diagnosis not present

## 2019-01-28 DIAGNOSIS — M2550 Pain in unspecified joint: Secondary | ICD-10-CM | POA: Diagnosis not present

## 2019-01-28 DIAGNOSIS — Z01812 Encounter for preprocedural laboratory examination: Secondary | ICD-10-CM | POA: Insufficient documentation

## 2019-01-28 DIAGNOSIS — Z6841 Body Mass Index (BMI) 40.0 and over, adult: Secondary | ICD-10-CM | POA: Diagnosis not present

## 2019-01-28 DIAGNOSIS — N400 Enlarged prostate without lower urinary tract symptoms: Secondary | ICD-10-CM | POA: Diagnosis present

## 2019-01-28 DIAGNOSIS — Z880 Allergy status to penicillin: Secondary | ICD-10-CM | POA: Diagnosis not present

## 2019-01-28 DIAGNOSIS — E1165 Type 2 diabetes mellitus with hyperglycemia: Secondary | ICD-10-CM

## 2019-01-28 DIAGNOSIS — M549 Dorsalgia, unspecified: Secondary | ICD-10-CM | POA: Diagnosis present

## 2019-01-28 DIAGNOSIS — G8929 Other chronic pain: Secondary | ICD-10-CM | POA: Diagnosis present

## 2019-01-28 DIAGNOSIS — E119 Type 2 diabetes mellitus without complications: Secondary | ICD-10-CM | POA: Diagnosis not present

## 2019-01-28 DIAGNOSIS — Z8673 Personal history of transient ischemic attack (TIA), and cerebral infarction without residual deficits: Secondary | ICD-10-CM | POA: Diagnosis not present

## 2019-01-28 DIAGNOSIS — S9303XA Subluxation of unspecified ankle joint, initial encounter: Secondary | ICD-10-CM | POA: Diagnosis present

## 2019-01-28 DIAGNOSIS — Z23 Encounter for immunization: Secondary | ICD-10-CM | POA: Diagnosis present

## 2019-01-28 DIAGNOSIS — IMO0002 Reserved for concepts with insufficient information to code with codable children: Secondary | ICD-10-CM

## 2019-01-28 DIAGNOSIS — Z20828 Contact with and (suspected) exposure to other viral communicable diseases: Secondary | ICD-10-CM | POA: Diagnosis present

## 2019-01-28 DIAGNOSIS — R269 Unspecified abnormalities of gait and mobility: Secondary | ICD-10-CM | POA: Diagnosis not present

## 2019-01-28 DIAGNOSIS — R5381 Other malaise: Secondary | ICD-10-CM | POA: Diagnosis not present

## 2019-01-28 DIAGNOSIS — F329 Major depressive disorder, single episode, unspecified: Secondary | ICD-10-CM | POA: Diagnosis present

## 2019-01-28 LAB — POCT GLYCOSYLATED HEMOGLOBIN (HGB A1C): Hemoglobin A1C: 8.7 % — AB (ref 4.0–5.6)

## 2019-01-28 NOTE — Addendum Note (Signed)
Addended by: Lenon Oms on: 01/28/2019 10:49 AM   Modules accepted: Orders

## 2019-01-28 NOTE — Progress Notes (Signed)
[facility] [place], [place] [address]  Internal MEDICINE  Office Visit Note  Patient Name: Victor Wise  [id number]  [id number]  Date of Service: 01/28/2019  Chief Complaint  Patient presents with  . Medical Clearance    The patient is here for surgical clearance. He is scheduled to have surgical repair of his left ankle on Thursday, 01/31/2019. He fell at home, right knee giving out, and left leg landed under the sink.. he fractured the left ankle in two places and also broke the left tib/fib. He states that he feels well. Denies shortness of breath, chest pain or chest pressure. His blood sugars are elevated. His HgbA1c 8.7 today. He does have significant history of coronary artery disease and cerebrovascular disease. He has also had partial amputations of toes due to peripheral vascular disease from diabetes. I recommend he have clearance from his cardiologist in addition to [place] as primary care, as he is high risk for complications during surgery.       Current Medication: Outpatient Encounter Medications as of 01/28/2019  Medication Sig Note  . acetaminophen (TYLENOL) 500 MG tablet Take 1,000 mg by mouth every 8 (eight) hours as needed for moderate pain.   [name] amLODipine (NORVASC) 10 MG tablet TAKE 1 TABLET BY MOUTH ONCE DAILY   . aspirin EC 81 MG tablet Take 81 mg by mouth daily.   . benazepril (LOTENSIN) 20 MG tablet Take 1 tablet (20 mg total) by mouth 2 (two) times daily.   . cetirizine (ZYRTEC) 10 MG tablet Take 10 mg by mouth daily as needed for allergies.   [name] clopidogrel (PLAVIX) 75 MG tablet Take 1 tablet (75 mg total) by mouth daily.   . DULoxetine (CYMBALTA) 30 MG capsule Take 30-60 mg by mouth See admin instructions. 60 mg in the morning, 30 mg in the evening   . Exenatide ER (BYDUREON) 2 MG PEN Inject 2 mg into the skin once a week.   . ezetimibe (ZETIA) 10 MG tablet Take 10 mg by mouth daily.    . fenofibrate 54 MG tablet TAKE 1 TABLET BY MOUTH A  DAY AT SUPPERTIME   . fluticasone (FLONASE) 50 MCG/ACT nasal spray Place 2 sprays into both nostrils daily as needed for allergies.    . furosemide (LASIX) 20 MG tablet Take 20 mg by mouth daily.   [name] gemfibrozil (LOPID) 600 MG tablet Take 1 tablet (600 mg total) by mouth 2 (two) times daily before a meal.   . glucose blood (TRUE METRIX BLOOD GLUCOSE TEST) test strip Use as instructed three times a day diag e11.65   . hydrochlorothiazide (HYDRODIURIL) 12.5 MG tablet Take 1 tablet (12.5 mg total) by mouth daily.   . Insulin Glargine (LANTUS SOLOSTAR) 100 UNIT/ML Solostar Pen Inject 71 Units into the skin at bedtime.   . Insulin Pen Needle (B-D UF III MINI PEN NEEDLES) 31G X 5 MM MISC Use as directed with insulin e11.65   . insulin regular (NOVOLIN R) 100 units/mL injection Patient to use Novolin R [place] TiD prior to meals per sliding scale instructions. Max daily dose is 45 units per day. 01/25/2019: [employer] brand  . INSULIN SYRINGE 1CC/29G 29G X 1/2" 1 ML MISC Insulin injections QID, use with lantus and regular insulin. DX E11.65   . Insulin Syringe-Needle U-100 (BD INSULIN SYRINGE U/F 1/2UNIT) 31G X 5/16" 0.3 ML MISC Indulin injection QID. Use with lantus and regular insulin Dx. 11.65   . lidocaine (LIDODERM) 5 %  Place 1 patch onto the skin daily as needed (pain). Remove & Discard patch within 12 hours or as directed by MD   . Karma Greaser 4 MG/0.1ML LIQD Take 4 mg by mouth as needed (for opioid overdose).    . nitroGLYCERIN (NITROSTAT) 0.4 MG SL tablet Place 1 tablet (0.4 mg total) under the tongue every 5 (five) minutes x 3 doses as needed for chest pain.   [name] omega-3 acid ethyl esters (LOVAZA) 1 g capsule Take 1 capsule (1 g total) by mouth 2 (two) times daily.   . Oxcarbazepine (TRILEPTAL) 300 MG tablet Take 300-600 mg by mouth See admin instructions. Take 1 tablet (300MG ) by mouth every morning and 2 tablets (600MG ) by mouth every night   . oxyCODONE (OXY IR/ROXICODONE) 5 MG immediate release tablet Take 1  tablet (5 mg total) by mouth every 4 (four) hours as needed for moderate pain.   [name] oxyCODONE (OXYCONTIN) 20 mg 12 hr tablet Take 20 mg by mouth every 12 (twelve) hours.    . pantoprazole (PROTONIX) 40 MG tablet TAKE 1 TABLET BY MOUTH A DAY FOR REFLUX   . polyethylene glycol (MIRALAX / GLYCOLAX) packet Take 17 g by mouth daily.  06/04/2018: PRN  . pregabalin (LYRICA) 200 MG capsule Take 200-400 mg by mouth See admin instructions. 400 mg in the morning, 200 mg in the evening   . tamsulosin (FLOMAX) 0.4 MG CAPS capsule Take 1 capsule (0.4 mg total) by mouth daily after supper.    No facility-administered encounter medications on file as of 01/28/2019.     Surgical History: Past Surgical History:  Procedure Laterality Date  . AMPUTATION TOE Left 04/19/2018   Procedure: AMPUTATION TOE-LEFT GREAT TOE;  Surgeon: Sharlotte Alamo, DPM;  Location: [facility];  Service: Podiatry;  Laterality: Left;  . CARDIAC CATHETERIZATION N/A 12/19/2014   Procedure: Coronary Stent Intervention;  Surgeon: Charolette Forward, MD;  Location: [place] [facility];  Service: Cardiovascular;  Laterality: N/A;  . CARDIAC CATHETERIZATION Left 12/19/2014   Procedure: Left Heart Cath and Coronary Angiography;  Surgeon: Dionisio Eliodoro, MD;  Location: [place] [facility];  Service: Cardiovascular;  Laterality: Left;  . IRRIGATION AND DEBRIDEMENT FOOT Left 03/02/2018   Procedure: IRRIGATION AND DEBRIDEMENT FOOT;  Surgeon: Samara Deist, DPM;  Location: [facility];  Service: Podiatry;  Laterality: Left;  . IRRIGATION AND DEBRIDEMENT FOOT Left 03/08/2018   Procedure: IRRIGATION AND DEBRIDEMENT FOOT AND BONE;  Surgeon: Sharlotte Alamo, DPM;  Location: [facility];  Service: Podiatry;  Laterality: Left;  . IRRIGATION AND DEBRIDEMENT FOOT Left 04/19/2018   Procedure: IRRIGATION AND DEBRIDEMENT FOOT;  Surgeon: Sharlotte Alamo, DPM;  Location: [facility];  Service: Podiatry;  Laterality: Left;  . LOWER EXTREMITY ANGIOGRAPHY Left 03/05/2018   Procedure: Lower Extremity  Angiography;  Surgeon: Algernon Huxley, MD;  Location: [place] [facility];  Service: Cardiovascular;  Laterality: Left;  . LOWER EXTREMITY ANGIOGRAPHY Left 03/08/2018   Procedure: LOWER EXTREMITY ANGIOGRAPHY;  Surgeon: Algernon Huxley, MD;  Location: [place] [facility];  Service: Cardiovascular;  Laterality: Left;  . PACEMAKER INSERTION Left 11/02/2015   Procedure: INSERTION PACEMAKER;  Surgeon: Isaias Cowman, MD;  Location: [facility];  Service: Cardiovascular;  Laterality: Left;  . Pain Stimulator    . Right toe amputation      Medical History: Past Medical History:  Diagnosis Date  . Allergy   . Basal cell carcinoma    forehead  . BPH (benign prostatic hyperplasia)   . Chronic back pain   .  Depression   . Diabetes ([place])   . GERD (gastroesophageal reflux disease)   . Heart attack ([place])   . Hypertension   . Morbid obesity ([place])   . Obstructive sleep apnea   . 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 Status post insertion of spinal cord stimulator   . Stroke ([place])   . UTI (lower urinary tract infection)     Family History: Family History  Problem Relation Age of Onset  . Breast cancer Mother   . Cancer Mother   . Hypertension Mother   . Lung cancer Father   . Hypertension Father   . Heart disease Father   . Cancer Father   . Kidney disease Sister   . Prostate cancer Neg Hx     Social History   Socioeconomic History  . Marital status: Divorced    Spouse name: Not on file  . Number of children: Not on file  . Years of education: Not on file  . Highest education level: Not on file  Occupational History  . Not on file  Social Needs  . Financial resource strain: Not on file  . Food insecurity    Worry: Not on file    Inability: Not on file  . Transportation needs    Medical: Not on file    Non-medical: Not on file  Tobacco Use  . Smoking status: Former Smoker    Types: Cigarettes  . Smokeless tobacco: Never Used  . Tobacco comment: quit 45 years  Substance and  Sexual Activity  . Alcohol use: Not Currently    Alcohol/week: 0.0 standard drinks    Comment: have not had alcohol in 22yrs  . Drug use: No  . Sexual activity: Not on file  Lifestyle  . Physical activity    Days per week: Not on file    Minutes per session: Not on file  . Stress: Not on file  Relationships  . Social Herbalist on phone: Not on file    Gets together: Not on file    Attends religious service: Not on file    Active member of club or organization: Not on file    Attends meetings of clubs or organizations: Not on file    Relationship status: Not on file  . Intimate partner violence    Fear of current or ex partner: Not on file    Emotionally abused: Not on file    Physically abused: Not on file    Forced sexual activity: Not on file  Other Topics Concern  . Not on file  Social History Narrative  . Not on file      Review of Systems  Constitutional: Positive for fatigue. Negative for chills and unexpected weight change.  HENT: Negative for congestion, postnasal drip, rhinorrhea, sneezing and sore throat.   Respiratory: Negative for cough, chest tightness and shortness of breath.   Cardiovascular: Negative for chest pain and palpitations.  Gastrointestinal: Negative for abdominal pain, constipation, diarrhea, nausea and vomiting.  Endocrine: Negative for cold intolerance, heat intolerance, polydipsia and polyuria.       Blood sugars elevated. States that he has been out of [place] since May.   Genitourinary: Negative for dysuria, frequency, hematuria and urgency.  Musculoskeletal: Positive for arthralgias and gait problem. Negative for back pain, joint swelling and neck pain.       Recent fall at home resulted in two fractures of left ankle and left tib/fib.   Skin: Negative for rash.  Allergic/Immunologic: Negative for environmental allergies.  Neurological: Negative for dizziness, tremors, numbness and headaches.  Hematological: Negative for  adenopathy. Does not bruise/bleed easily.  Psychiatric/Behavioral: Positive for dysphoric mood. Negative for behavioral problems (Depression), sleep disturbance and suicidal ideas. The patient is not nervous/anxious.     Today's Vitals   01/28/19 0855  BP: 101/60  Pulse: 61  Resp: 16  SpO2: 94%  Weight: 299 lb (135.6 kg)  Height: 5\' 8"  (1.727 m)   Body mass index is 45.46 kg/m.  Physical Exam Vitals signs and nursing note reviewed.  Constitutional:      General: He is not in acute distress.    Appearance: He is well-developed. He is obese. He is not diaphoretic.  HENT:     Head: Normocephalic and atraumatic.     Mouth/Throat:     Pharynx: No oropharyngeal exudate.  Eyes:     Pupils: Pupils are equal, round, and reactive to light.  Neck:     Musculoskeletal: Normal range of motion and neck supple.     Thyroid: No thyromegaly.     Vascular: No carotid bruit or JVD.     Trachea: No tracheal deviation.  Cardiovascular:     Rate and Rhythm: Normal rate and regular rhythm.     Heart sounds: Normal heart sounds. No murmur. No friction rub. No gallop.   Pulmonary:     Effort: Pulmonary effort is normal. No respiratory distress.     Breath sounds: Normal breath sounds. No wheezing or rales.  Chest:     Chest wall: No tenderness.  Abdominal:     General: Bowel sounds are normal.     Palpations: Abdomen is soft.     Tenderness: There is no abdominal tenderness.  Musculoskeletal: Normal range of motion.     Comments: Left lower leg is casted. He is currently using a manual wheelchair for mobility.   Lymphadenopathy:     Cervical: No cervical adenopathy.  Skin:    General: Skin is warm and dry.  Neurological:     Mental Status: He is alert and oriented to person, place, and time.     Cranial Nerves: No cranial nerve deficit.  Psychiatric:        Behavior: Behavior normal.        Thought Content: Thought content normal.        Judgment: Judgment normal.    Assessment/Plan: 1. Uncontrolled type 2 diabetes mellitus with diabetic neuropathy, with long-term current use of insulin (HCC) - POCT HgB A1C 8.7 today. Patient given samples of bydurean 2mg  weekly. Will get prescription to [facility] for replenishment of supplies. contiue lantus 71mg  daily. Use sliding scale insulin as needed and as prescribed.  - CBC, No Differential/Platelet - Comprehensive Metabolic Panel (CMET)  2. 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  3. Encounter for other preprocedural examination Patient is high risk surgical candidate. His BMI is 45 and HgbA1c 8.7 today and he has strong history of coronary artery disease and peripheral vascular disease with ulceration and history of amputation. I believe he should have cardiac clearance prior to surgery. He currently sees Dr. Nehemiah Massed, cardiology.  - CBC, No Differential/Platelet - Comprehensive Metabolic Panel (CMET)  4. Essential hypertension Stable.   5. Coronary artery disease due to lipid rich plaque Recommend cardiac clearance prior to surgical repair of left ankle and lower leg.   6.  Dysuria - UA/M w/rflx Culture, Routine  General Counseling: windle goncalves understanding of the findings of todays visit and agrees with plan of treatment. I have discussed any further diagnostic evaluation that may be needed or ordered today. We also reviewed his medications today. he has been encouraged to call the office with any questions or concerns that should arise related to todays visit.  Diabetes Counseling:  1. Addition of ACE inh/ ARB'S for nephroprotection. Microalbumin is updated  2. Diabetic foot care, prevention of complications. Podiatry consult 3. Exercise and lose weight.  4. Diabetic eye examination, Diabetic eye exam is updated  5. Monitor blood sugar closlely. nutrition  counseling.  6. Sign and symptoms of hypoglycemia including shaking sweating,confusion and headaches.  This patient was seen by Leretha Pol FNP Collaboration with Dr Lavera Guise as a part of collaborative care agreement  Orders Placed This Encounter  Procedures  . UA/M w/rflx Culture, Routine  . CBC, No Differential/Platelet  . Comprehensive Metabolic Panel (CMET)  . POCT HgB A1C      Time spent: [facility]

## 2019-01-28 NOTE — Addendum Note (Signed)
Addended by: Lenon Oms on: 01/28/2019 10:48 AM   Modules accepted: Orders

## 2019-01-29 LAB — CBC WITH DIFFERENTIAL/PLATELET
Basophils Absolute: 0.1 10*3/uL (ref 0.0–0.2)
Basos: 3 %
EOS (ABSOLUTE): 0.2 10*3/uL (ref 0.0–0.4)
Eos: 4 %
Hematocrit: 35.9 % — ABNORMAL LOW (ref 37.5–51.0)
Hemoglobin: 12.3 g/dL — ABNORMAL LOW (ref 13.0–17.7)
Immature Grans (Abs): 0 10*3/uL (ref 0.0–0.1)
Immature Granulocytes: 1 %
Lymphocytes Absolute: 1.9 10*3/uL (ref 0.7–3.1)
Lymphs: 39 %
MCH: 26.7 pg (ref 26.6–33.0)
MCHC: 34.3 g/dL (ref 31.5–35.7)
MCV: 78 fL — ABNORMAL LOW (ref 79–97)
Monocytes Absolute: 0.6 10*3/uL (ref 0.1–0.9)
Monocytes: 12 %
Neutrophils Absolute: 2.1 10*3/uL (ref 1.4–7.0)
Neutrophils: 41 %
Platelets: 292 10*3/uL (ref 150–450)
RBC: 4.61 x10E6/uL (ref 4.14–5.80)
RDW: 16.8 % — ABNORMAL HIGH (ref 11.6–15.4)
WBC: 4.9 10*3/uL (ref 3.4–10.8)

## 2019-01-29 LAB — SPECIMEN STATUS REPORT

## 2019-01-29 LAB — SARS CORONAVIRUS 2 (TAT 6-24 HRS): SARS Coronavirus 2: NEGATIVE

## 2019-01-29 NOTE — Progress Notes (Signed)
Waiting on all results. Will fax to emerg ortho when ready.

## 2019-01-30 LAB — COMPREHENSIVE METABOLIC PANEL
ALT: 10 IU/L (ref 0–44)
AST: 16 IU/L (ref 0–40)
Albumin/Globulin Ratio: 1.6 (ref 1.2–2.2)
Albumin: 4.2 g/dL (ref 3.8–4.8)
Alkaline Phosphatase: 77 IU/L (ref 39–117)
BUN/Creatinine Ratio: 24 (ref 10–24)
BUN: 25 mg/dL (ref 8–27)
Bilirubin Total: <0.2 mg/dL (ref 0.0–1.2)
CO2: 26 mmol/L (ref 20–29)
Calcium: 9.3 mg/dL (ref 8.6–10.2)
Chloride: 101 mmol/L (ref 96–106)
Creatinine, Ser: 1.03 mg/dL (ref 0.76–1.27)
GFR calc Af Amer: 86 mL/min/{1.73_m2} (ref >59)
GFR calc non Af Amer: 75 mL/min/{1.73_m2} (ref >59)
Globulin, Total: 2.6 g/dL (ref 1.5–4.5)
Glucose: 114 mg/dL — ABNORMAL HIGH (ref 65–99)
Potassium: 4 mmol/L (ref 3.5–5.2)
Sodium: 142 mmol/L (ref 134–144)
Total Protein: 6.8 g/dL (ref 6.0–8.5)

## 2019-01-30 LAB — SPECIMEN STATUS REPORT

## 2019-01-30 MED ORDER — VANCOMYCIN HCL 10 G IV SOLR
1500.0000 mg | INTRAVENOUS | Status: AC
  Administered 2019-01-31: 12:00:00 1500 mg via INTRAVENOUS
  Filled 2019-01-30: qty 1500

## 2019-01-30 NOTE — Pre-Procedure Instructions (Addendum)
Call received form pre-op to send pacemaker implanted cardiac device programming form to Dr Mack Guise, per request of Dr Ronelle Nigh.  The above mentioned form faxed as requested. Dr Andree Elk notified regarding patient history. "We need medcial clearance and the pacemaker interrogated."  Unable to get cardiology to  Interrogate pacemaker. Dr Andree Elk notified regarding the above mentioned.  "Dr Mack Guise has to declare the patient as an emergency for [place] to do the case per Dr Andree Elk.Hulen Skains Dr Audrie Gallus, informed that Dr Mack Guise has  declared the patient's surgery as an emergency.

## 2019-01-30 NOTE — Progress Notes (Signed)
Overall, labs ok. Faxed results to [facility] for surgical clearance.

## 2019-01-30 NOTE — Pre-Procedure Instructions (Signed)
This patient has been instructed via phone to arrive early to his surgery appointment for required lab work, EKG, and evaluation. He was given instructions about appropriate skin preparation for tonight and in the morning. Instructions were provided for necessary medication adjustments including insulins and diuretics. The patient repeated the instructions correctly. Questions were encouraged and answered. Understanding was verbalized.

## 2019-01-31 ENCOUNTER — Encounter
Admission: AD | Disposition: A | Discharge status: Skilled Nursing Facility | Payer: Self-pay | Source: Home / Self Care | Attending: Orthopedic Surgery

## 2019-01-31 ENCOUNTER — Inpatient Hospital Stay
Admission: AD | Admit: 2019-01-31 | Discharge: 2019-02-07 | DRG: 493 | Disposition: A | Discharge status: Skilled Nursing Facility | Payer: Medicare HMO | Attending: Orthopedic Surgery | Admitting: Orthopedic Surgery

## 2019-01-31 ENCOUNTER — Other Ambulatory Visit: Payer: Self-pay | Admitting: Adult Health

## 2019-01-31 ENCOUNTER — Ambulatory Visit: Payer: Medicare HMO | Admitting: Anesthesiology

## 2019-01-31 ENCOUNTER — Other Ambulatory Visit: Payer: Self-pay

## 2019-01-31 ENCOUNTER — Encounter: Payer: Self-pay | Admitting: Anesthesiology

## 2019-01-31 ENCOUNTER — Ambulatory Visit: Payer: Medicare HMO

## 2019-01-31 DIAGNOSIS — G4733 Obstructive sleep apnea (adult) (pediatric): Secondary | ICD-10-CM | POA: Diagnosis not present

## 2019-01-31 DIAGNOSIS — Z87891 Personal history of nicotine dependence: Secondary | ICD-10-CM | POA: Diagnosis not present

## 2019-01-31 DIAGNOSIS — N400 Enlarged prostate without lower urinary tract symptoms: Secondary | ICD-10-CM | POA: Diagnosis present

## 2019-01-31 DIAGNOSIS — Z803 Family history of malignant neoplasm of breast: Secondary | ICD-10-CM | POA: Diagnosis present

## 2019-01-31 DIAGNOSIS — I110 Hypertensive heart disease with heart failure: Secondary | ICD-10-CM | POA: Diagnosis present

## 2019-01-31 DIAGNOSIS — I252 Old myocardial infarction: Secondary | ICD-10-CM | POA: Diagnosis not present

## 2019-01-31 DIAGNOSIS — Z6841 Body Mass Index (BMI) 40.0 and over, adult: Secondary | ICD-10-CM

## 2019-01-31 DIAGNOSIS — Z794 Long term (current) use of insulin: Secondary | ICD-10-CM

## 2019-01-31 DIAGNOSIS — Z7902 Long term (current) use of antithrombotics/antiplatelets: Secondary | ICD-10-CM

## 2019-01-31 DIAGNOSIS — Z880 Allergy status to penicillin: Secondary | ICD-10-CM | POA: Diagnosis not present

## 2019-01-31 DIAGNOSIS — Z23 Encounter for immunization: Secondary | ICD-10-CM | POA: Diagnosis present

## 2019-01-31 DIAGNOSIS — Z955 Presence of coronary angioplasty implant and graft: Secondary | ICD-10-CM

## 2019-01-31 DIAGNOSIS — I2510 Atherosclerotic heart disease of native coronary artery without angina pectoris: Secondary | ICD-10-CM | POA: Diagnosis present

## 2019-01-31 DIAGNOSIS — Z8249 Family history of ischemic heart disease and other diseases of the circulatory system: Secondary | ICD-10-CM

## 2019-01-31 DIAGNOSIS — Z89422 Acquired absence of other left toe(s): Secondary | ICD-10-CM | POA: Diagnosis not present

## 2019-01-31 DIAGNOSIS — Z20828 Contact with and (suspected) exposure to other viral communicable diseases: Secondary | ICD-10-CM | POA: Diagnosis present

## 2019-01-31 DIAGNOSIS — Z8673 Personal history of transient ischemic attack (TIA), and cerebral infarction without residual deficits: Secondary | ICD-10-CM | POA: Diagnosis not present

## 2019-01-31 DIAGNOSIS — S82842A Displaced bimalleolar fracture of left lower leg, initial encounter for closed fracture: Principal | ICD-10-CM | POA: Diagnosis present

## 2019-01-31 DIAGNOSIS — E782 Mixed hyperlipidemia: Secondary | ICD-10-CM | POA: Diagnosis not present

## 2019-01-31 DIAGNOSIS — Z7951 Long term (current) use of inhaled steroids: Secondary | ICD-10-CM

## 2019-01-31 DIAGNOSIS — K219 Gastro-esophageal reflux disease without esophagitis: Secondary | ICD-10-CM | POA: Diagnosis present

## 2019-01-31 DIAGNOSIS — F329 Major depressive disorder, single episode, unspecified: Secondary | ICD-10-CM | POA: Diagnosis present

## 2019-01-31 DIAGNOSIS — Z881 Allergy status to other antibiotic agents status: Secondary | ICD-10-CM | POA: Diagnosis not present

## 2019-01-31 DIAGNOSIS — Z79899 Other long term (current) drug therapy: Secondary | ICD-10-CM

## 2019-01-31 DIAGNOSIS — Z950 Presence of cardiac pacemaker: Secondary | ICD-10-CM

## 2019-01-31 DIAGNOSIS — M549 Dorsalgia, unspecified: Secondary | ICD-10-CM | POA: Diagnosis present

## 2019-01-31 DIAGNOSIS — S82842G Displaced bimalleolar fracture of left lower leg, subsequent encounter for closed fracture with delayed healing: Secondary | ICD-10-CM | POA: Diagnosis not present

## 2019-01-31 DIAGNOSIS — Z79891 Long term (current) use of opiate analgesic: Secondary | ICD-10-CM

## 2019-01-31 DIAGNOSIS — Z801 Family history of malignant neoplasm of trachea, bronchus and lung: Secondary | ICD-10-CM

## 2019-01-31 DIAGNOSIS — N390 Urinary tract infection, site not specified: Secondary | ICD-10-CM | POA: Diagnosis present

## 2019-01-31 DIAGNOSIS — I5032 Chronic diastolic (congestive) heart failure: Secondary | ICD-10-CM | POA: Diagnosis present

## 2019-01-31 DIAGNOSIS — G4730 Sleep apnea, unspecified: Secondary | ICD-10-CM | POA: Diagnosis present

## 2019-01-31 DIAGNOSIS — G8929 Other chronic pain: Secondary | ICD-10-CM | POA: Diagnosis present

## 2019-01-31 DIAGNOSIS — S9303XA Subluxation of unspecified ankle joint, initial encounter: Secondary | ICD-10-CM | POA: Diagnosis present

## 2019-01-31 DIAGNOSIS — E1140 Type 2 diabetes mellitus with diabetic neuropathy, unspecified: Secondary | ICD-10-CM | POA: Diagnosis present

## 2019-01-31 DIAGNOSIS — E1165 Type 2 diabetes mellitus with hyperglycemia: Secondary | ICD-10-CM | POA: Diagnosis not present

## 2019-01-31 DIAGNOSIS — Z7982 Long term (current) use of aspirin: Secondary | ICD-10-CM

## 2019-01-31 HISTORY — DX: Heart failure, unspecified: I50.9

## 2019-01-31 HISTORY — DX: Atherosclerotic heart disease of native coronary artery without angina pectoris: I25.10

## 2019-01-31 HISTORY — PX: ORIF ANKLE FRACTURE: SHX5408

## 2019-01-31 LAB — CBC WITH DIFFERENTIAL/PLATELET
Abs Immature Granulocytes: 0.03 10*3/uL (ref 0.00–0.07)
Basophils Absolute: 0.1 10*3/uL (ref 0.0–0.1)
Basophils Relative: 1 %
Eosinophils Absolute: 0.2 10*3/uL (ref 0.0–0.5)
Eosinophils Relative: 4 %
HCT: 38.4 % — ABNORMAL LOW (ref 39.0–52.0)
Hemoglobin: 12.8 g/dL — ABNORMAL LOW (ref 13.0–17.0)
Immature Granulocytes: 1 %
Lymphocytes Relative: 32 %
Lymphs Abs: 1.7 10*3/uL (ref 0.7–4.0)
MCH: 26.4 pg (ref 26.0–34.0)
MCHC: 33.3 g/dL (ref 30.0–36.0)
MCV: 79.3 fL — ABNORMAL LOW (ref 80.0–100.0)
Monocytes Absolute: 0.4 10*3/uL (ref 0.1–1.0)
Monocytes Relative: 7 %
Neutro Abs: 2.8 10*3/uL (ref 1.7–7.7)
Neutrophils Relative %: 55 %
Platelets: 250 10*3/uL (ref 150–400)
RBC: 4.84 MIL/uL (ref 4.22–5.81)
RDW: 16.4 % — ABNORMAL HIGH (ref 11.5–15.5)
WBC: 5.2 10*3/uL (ref 4.0–10.5)
nRBC: 0 % (ref 0.0–0.2)

## 2019-01-31 LAB — BASIC METABOLIC PANEL
Anion gap: 15 (ref 5–15)
BUN: 34 mg/dL — ABNORMAL HIGH (ref 8–23)
CO2: 27 mmol/L (ref 22–32)
Calcium: 9.6 mg/dL (ref 8.9–10.3)
Chloride: 97 mmol/L — ABNORMAL LOW (ref 98–111)
Creatinine, Ser: 1.14 mg/dL (ref 0.61–1.24)
GFR calc Af Amer: >60 mL/min (ref >60)
GFR calc non Af Amer: >60 mL/min (ref >60)
Glucose, Bld: 221 mg/dL — ABNORMAL HIGH (ref 70–99)
Potassium: 3.6 mmol/L (ref 3.5–5.1)
Sodium: 139 mmol/L (ref 135–145)

## 2019-01-31 LAB — MICROSCOPIC EXAMINATION

## 2019-01-31 LAB — TYPE AND SCREEN
ABO/RH(D): O POS
Antibody Screen: NEGATIVE

## 2019-01-31 LAB — UA/M W/RFLX CULTURE, ROUTINE
Bilirubin, UA: NEGATIVE
Glucose, UA: NEGATIVE
Ketones, UA: NEGATIVE
Nitrite, UA: NEGATIVE
Protein,UA: NEGATIVE
RBC, UA: NEGATIVE
Specific Gravity, UA: 1.012 (ref 1.005–1.030)
Urobilinogen, Ur: 0.2 mg/dL (ref 0.2–1.0)
pH, UA: 5 (ref 5.0–7.5)

## 2019-01-31 LAB — PROTIME-INR
INR: 0.9 (ref 0.8–1.2)
Prothrombin Time: 12.5 seconds (ref 11.4–15.2)

## 2019-01-31 LAB — GLUCOSE, CAPILLARY
Glucose-Capillary: 208 mg/dL — ABNORMAL HIGH (ref 70–99)
Glucose-Capillary: 193 mg/dL — ABNORMAL HIGH (ref 70–99)
Glucose-Capillary: 164 mg/dL — ABNORMAL HIGH (ref 70–99)
Glucose-Capillary: 200 mg/dL — ABNORMAL HIGH (ref 70–99)

## 2019-01-31 LAB — URINE CULTURE, REFLEX

## 2019-01-31 LAB — CBC
HCT: 36.2 % — ABNORMAL LOW (ref 39.0–52.0)
Hemoglobin: 12.1 g/dL — ABNORMAL LOW (ref 13.0–17.0)
MCH: 26.5 pg (ref 26.0–34.0)
MCHC: 33.4 g/dL (ref 30.0–36.0)
MCV: 79.4 fL — ABNORMAL LOW (ref 80.0–100.0)
Platelets: 380 10*3/uL (ref 150–400)
RBC: 4.56 MIL/uL (ref 4.22–5.81)
RDW: 16.8 % — ABNORMAL HIGH (ref 11.5–15.5)
WBC: 9.5 10*3/uL (ref 4.0–10.5)
nRBC: 0 % (ref 0.0–0.2)

## 2019-01-31 LAB — APTT: aPTT: 29 seconds (ref 24–36)

## 2019-01-31 LAB — ABO/RH: ABO/RH(D): O POS

## 2019-01-31 SURGERY — OPEN REDUCTION INTERNAL FIXATION (ORIF) ANKLE FRACTURE
Anesthesia: General | Site: Ankle | Laterality: Left

## 2019-01-31 MED ORDER — ONDANSETRON HCL 4 MG/2ML IJ SOLN
INTRAMUSCULAR | Status: DC | PRN
  Administered 2019-01-31: 4 mg via INTRAVENOUS

## 2019-01-31 MED ORDER — PREGABALIN 75 MG PO CAPS
200.0000 mg | ORAL_CAPSULE | Freq: Every day | ORAL | Status: DC
  Administered 2019-01-31 – 2019-02-06 (×7): 200 mg via ORAL
  Filled 2019-01-31 (×7): qty 2

## 2019-01-31 MED ORDER — EXENATIDE ER 2 MG ~~LOC~~ PEN
2.0000 mg | PEN_INJECTOR | SUBCUTANEOUS | Status: DC
  Filled 2019-01-31: qty 1

## 2019-01-31 MED ORDER — ONDANSETRON HCL 4 MG PO TABS: 4.0000 mg | ORAL_TABLET | Freq: Four times a day (QID) | ORAL | Status: DC | PRN

## 2019-01-31 MED ORDER — ACETAMINOPHEN 325 MG PO TABS: 325.0000 mg | ORAL_TABLET | Freq: Four times a day (QID) | ORAL | Status: DC | PRN

## 2019-01-31 MED ORDER — NEOMYCIN-POLYMYXIN B GU 40-200000 IR SOLN
Status: AC
  Filled 2019-01-31: qty 4

## 2019-01-31 MED ORDER — HYDROCHLOROTHIAZIDE 25 MG PO TABS: 12.5000 mg | ORAL_TABLET | Freq: Every day | ORAL | Status: DC

## 2019-01-31 MED ORDER — OXYCODONE HCL 5 MG PO TABS
5.0000 mg | ORAL_TABLET | ORAL | Status: DC | PRN
  Administered 2019-02-01 (×2): 10 mg via ORAL
  Administered 2019-02-07: 5 mg via ORAL
  Filled 2019-01-31: qty 1
  Filled 2019-01-31 (×2): qty 2

## 2019-01-31 MED ORDER — INFLUENZA VAC A&B SA ADJ QUAD 0.5 ML IM PRSY
0.5000 mL | PREFILLED_SYRINGE | INTRAMUSCULAR | Status: AC
  Administered 2019-02-01: 12:00:00 0.5 mL via INTRAMUSCULAR
  Filled 2019-01-31: qty 0.5

## 2019-01-31 MED ORDER — OXYCODONE HCL ER 10 MG PO T12A
20.0000 mg | EXTENDED_RELEASE_TABLET | Freq: Two times a day (BID) | ORAL | Status: DC
  Administered 2019-01-31 – 2019-02-07 (×14): 20 mg via ORAL
  Filled 2019-01-31 (×14): qty 2

## 2019-01-31 MED ORDER — CHLORHEXIDINE GLUCONATE CLOTH 2 % EX PADS: 6.0000 | MEDICATED_PAD | Freq: Once | CUTANEOUS | Status: DC

## 2019-01-31 MED ORDER — DULOXETINE HCL 30 MG PO CPEP
60.0000 mg | ORAL_CAPSULE | Freq: Every morning | ORAL | Status: DC
  Administered 2019-02-01 – 2019-02-07 (×7): 60 mg via ORAL
  Filled 2019-01-31 (×7): qty 2

## 2019-01-31 MED ORDER — ONDANSETRON HCL 4 MG/2ML IJ SOLN
INTRAMUSCULAR | Status: AC
  Filled 2019-01-31: qty 2

## 2019-01-31 MED ORDER — OXCARBAZEPINE 300 MG PO TABS
300.0000 mg | ORAL_TABLET | Freq: Every morning | ORAL | Status: DC
  Administered 2019-02-01 – 2019-02-07 (×7): 300 mg via ORAL
  Filled 2019-01-31 (×7): qty 1

## 2019-01-31 MED ORDER — TRAMADOL HCL 50 MG PO TABS
50.0000 mg | ORAL_TABLET | Freq: Four times a day (QID) | ORAL | Status: DC
  Administered 2019-02-01 – 2019-02-06 (×17): 50 mg via ORAL
  Filled 2019-01-31 (×17): qty 1

## 2019-01-31 MED ORDER — PROPOFOL 10 MG/ML IV BOLUS
INTRAVENOUS | Status: DC | PRN
  Administered 2019-01-31: 100 mg via INTRAVENOUS

## 2019-01-31 MED ORDER — EZETIMIBE 10 MG PO TABS
10.0000 mg | ORAL_TABLET | Freq: Every day | ORAL | Status: DC
  Administered 2019-02-01 – 2019-02-07 (×7): 10 mg via ORAL
  Filled 2019-01-31 (×7): qty 1

## 2019-01-31 MED ORDER — FLUTICASONE PROPIONATE 50 MCG/ACT NA SUSP
2.0000 | Freq: Every day | NASAL | Status: DC | PRN
  Filled 2019-01-31: qty 16

## 2019-01-31 MED ORDER — INSULIN ASPART 100 UNIT/ML ~~LOC~~ SOLN
0.0000 [IU] | Freq: Every day | SUBCUTANEOUS | Status: DC
  Administered 2019-02-01: 3 [IU] via SUBCUTANEOUS
  Administered 2019-02-02 – 2019-02-04 (×3): 2 [IU] via SUBCUTANEOUS
  Administered 2019-02-06: 22:00:00 3 [IU] via SUBCUTANEOUS
  Filled 2019-01-31 (×5): qty 1

## 2019-01-31 MED ORDER — LIDOCAINE HCL (PF) 2 % IJ SOLN
INTRAMUSCULAR | Status: AC
  Filled 2019-01-31: qty 10

## 2019-01-31 MED ORDER — HYDROMORPHONE HCL 1 MG/ML IJ SOLN
0.5000 mg | INTRAMUSCULAR | Status: DC | PRN
  Administered 2019-02-01 (×2): 1 mg via INTRAVENOUS
  Administered 2019-02-02: 0.5 mg via INTRAVENOUS
  Filled 2019-01-31 (×3): qty 1

## 2019-01-31 MED ORDER — BENAZEPRIL HCL 20 MG PO TABS: 20.0000 mg | ORAL_TABLET | Freq: Two times a day (BID) | ORAL | Status: DC

## 2019-01-31 MED ORDER — PNEUMOCOCCAL VAC POLYVALENT 25 MCG/0.5ML IJ INJ
0.5000 mL | INJECTION | INTRAMUSCULAR | Status: AC
  Administered 2019-02-01: 10:00:00 0.5 mL via INTRAMUSCULAR
  Filled 2019-01-31: qty 0.5

## 2019-01-31 MED ORDER — ACETAMINOPHEN 10 MG/ML IV SOLN
INTRAVENOUS | Status: DC | PRN
  Administered 2019-01-31: 1000 mg via INTRAVENOUS

## 2019-01-31 MED ORDER — SUCCINYLCHOLINE CHLORIDE 20 MG/ML IJ SOLN
INTRAMUSCULAR | Status: DC | PRN
  Administered 2019-01-31: 100 mg via INTRAVENOUS

## 2019-01-31 MED ORDER — INSULIN GLARGINE 100 UNIT/ML ~~LOC~~ SOLN
71.0000 [IU] | Freq: Every day | SUBCUTANEOUS | Status: DC
  Administered 2019-02-01 – 2019-02-06 (×6): 71 [IU] via SUBCUTANEOUS
  Filled 2019-01-31 (×7): qty 0.71

## 2019-01-31 MED ORDER — ACETAMINOPHEN 10 MG/ML IV SOLN
INTRAVENOUS | Status: AC
  Filled 2019-01-31: qty 100

## 2019-01-31 MED ORDER — PANTOPRAZOLE SODIUM 40 MG PO TBEC
40.0000 mg | DELAYED_RELEASE_TABLET | Freq: Every day | ORAL | Status: DC
  Administered 2019-02-01 – 2019-02-07 (×7): 40 mg via ORAL
  Filled 2019-01-31 (×7): qty 1

## 2019-01-31 MED ORDER — MAGNESIUM CITRATE PO SOLN
1.0000 | Freq: Once | ORAL | Status: DC | PRN
  Filled 2019-01-31: qty 296

## 2019-01-31 MED ORDER — CLOPIDOGREL BISULFATE 75 MG PO TABS
75.0000 mg | ORAL_TABLET | Freq: Every day | ORAL | Status: DC
  Administered 2019-02-01 – 2019-02-07 (×7): 75 mg via ORAL
  Filled 2019-01-31 (×7): qty 1

## 2019-01-31 MED ORDER — TAMSULOSIN HCL 0.4 MG PO CAPS
0.4000 mg | ORAL_CAPSULE | Freq: Every day | ORAL | Status: DC
  Administered 2019-02-01 – 2019-02-06 (×5): 0.4 mg via ORAL
  Filled 2019-01-31 (×5): qty 1

## 2019-01-31 MED ORDER — FERROUS SULFATE 325 (65 FE) MG PO TABS
325.0000 mg | ORAL_TABLET | Freq: Three times a day (TID) | ORAL | Status: DC
  Administered 2019-02-01 – 2019-02-07 (×19): 325 mg via ORAL
  Filled 2019-01-31 (×19): qty 1

## 2019-01-31 MED ORDER — POLYETHYLENE GLYCOL 3350 17 G PO PACK: 17.0000 g | PACK | Freq: Every day | ORAL | Status: DC | PRN

## 2019-01-31 MED ORDER — AMLODIPINE BESYLATE 10 MG PO TABS: 10.0000 mg | ORAL_TABLET | Freq: Every day | ORAL | Status: DC

## 2019-01-31 MED ORDER — NALOXONE HCL 4 MG/0.1ML NA LIQD
0.4000 mg | Freq: Once | NASAL | Status: DC | PRN
  Filled 2019-01-31: qty 8

## 2019-01-31 MED ORDER — MENTHOL 3 MG MT LOZG
1.0000 | LOZENGE | OROMUCOSAL | Status: DC | PRN
  Filled 2019-01-31: qty 9

## 2019-01-31 MED ORDER — HYDROCHLOROTHIAZIDE 12.5 MG PO TABS: 12.5000 mg | ORAL_TABLET | Freq: Every day | ORAL | 5 refills | Status: DC

## 2019-01-31 MED ORDER — ONDANSETRON HCL 4 MG/2ML IJ SOLN: 4.0000 mg | Freq: Four times a day (QID) | INTRAMUSCULAR | Status: DC | PRN

## 2019-01-31 MED ORDER — FENOFIBRATE 54 MG PO TABS
54.0000 mg | ORAL_TABLET | Freq: Every day | ORAL | Status: DC
  Administered 2019-02-01 – 2019-02-07 (×8): 54 mg via ORAL
  Filled 2019-01-31 (×8): qty 1

## 2019-01-31 MED ORDER — DOCUSATE SODIUM 100 MG PO CAPS
100.0000 mg | ORAL_CAPSULE | Freq: Two times a day (BID) | ORAL | Status: DC
  Administered 2019-01-31 – 2019-02-07 (×14): 100 mg via ORAL
  Filled 2019-01-31 (×14): qty 1

## 2019-01-31 MED ORDER — PHENOL 1.4 % MT LIQD
1.0000 | OROMUCOSAL | Status: DC | PRN
  Filled 2019-01-31: qty 177

## 2019-01-31 MED ORDER — ACETAMINOPHEN 500 MG PO TABS
1000.0000 mg | ORAL_TABLET | Freq: Four times a day (QID) | ORAL | Status: AC
  Administered 2019-01-31 – 2019-02-01 (×3): 1000 mg via ORAL
  Filled 2019-01-31 (×3): qty 2

## 2019-01-31 MED ORDER — FENTANYL CITRATE (PF) 100 MCG/2ML IJ SOLN: 25.0000 ug | INTRAMUSCULAR | Status: DC | PRN

## 2019-01-31 MED ORDER — PROPOFOL 10 MG/ML IV BOLUS
INTRAVENOUS | Status: AC
  Filled 2019-01-31: qty 40

## 2019-01-31 MED ORDER — SODIUM CHLORIDE 0.9 % IV SOLN
INTRAVENOUS | Status: DC
  Administered 2019-01-31: 999 mL/h via INTRAVENOUS

## 2019-01-31 MED ORDER — OMEGA-3-ACID ETHYL ESTERS 1 G PO CAPS
1.0000 g | ORAL_CAPSULE | Freq: Two times a day (BID) | ORAL | Status: DC
  Administered 2019-01-31 – 2019-02-07 (×14): 1 g via ORAL
  Filled 2019-01-31 (×15): qty 1

## 2019-01-31 MED ORDER — BISACODYL 10 MG RE SUPP: 10.0000 mg | Freq: Every day | RECTAL | Status: DC | PRN

## 2019-01-31 MED ORDER — PREGABALIN 50 MG PO CAPS
400.0000 mg | ORAL_CAPSULE | Freq: Every morning | ORAL | Status: DC
  Administered 2019-02-02 – 2019-02-07 (×6): 400 mg via ORAL
  Filled 2019-01-31 (×6): qty 8

## 2019-01-31 MED ORDER — OXYCODONE HCL 5 MG PO TABS
10.0000 mg | ORAL_TABLET | ORAL | Status: DC | PRN
  Administered 2019-02-01 – 2019-02-03 (×5): 15 mg via ORAL
  Filled 2019-01-31 (×5): qty 3

## 2019-01-31 MED ORDER — LIDOCAINE 5 % EX PTCH
1.0000 | MEDICATED_PATCH | CUTANEOUS | Status: DC
  Filled 2019-01-31 (×8): qty 1

## 2019-01-31 MED ORDER — POTASSIUM CHLORIDE IN NACL 20-0.9 MEQ/L-% IV SOLN
INTRAVENOUS | Status: DC
  Administered 2019-01-31 – 2019-02-03 (×3): via INTRAVENOUS
  Filled 2019-01-31 (×8): qty 1000

## 2019-01-31 MED ORDER — SODIUM CHLORIDE 0.9 % IV SOLN
INTRAVENOUS | Status: DC
  Administered 2019-01-31: 12:00:00 via INTRAVENOUS

## 2019-01-31 MED ORDER — PHENYLEPHRINE HCL (PRESSORS) 10 MG/ML IV SOLN
INTRAVENOUS | Status: DC | PRN
  Administered 2019-01-31 (×3): 100 ug via INTRAVENOUS
  Administered 2019-01-31: 200 ug via INTRAVENOUS
  Administered 2019-01-31: 100 ug via INTRAVENOUS

## 2019-01-31 MED ORDER — POLYETHYLENE GLYCOL 3350 17 G PO PACK
17.0000 g | PACK | Freq: Every day | ORAL | Status: DC
  Administered 2019-02-01 – 2019-02-07 (×7): 17 g via ORAL
  Filled 2019-01-31 (×7): qty 1

## 2019-01-31 MED ORDER — DULOXETINE HCL 30 MG PO CPEP
30.0000 mg | ORAL_CAPSULE | Freq: Every day | ORAL | Status: DC
  Administered 2019-01-31 – 2019-02-06 (×7): 30 mg via ORAL
  Filled 2019-01-31 (×8): qty 1

## 2019-01-31 MED ORDER — FUROSEMIDE 20 MG PO TABS
20.0000 mg | ORAL_TABLET | Freq: Every day | ORAL | Status: DC
  Administered 2019-02-01 – 2019-02-07 (×7): 20 mg via ORAL
  Filled 2019-01-31 (×7): qty 1

## 2019-01-31 MED ORDER — ONDANSETRON HCL 4 MG/2ML IJ SOLN: 4.0000 mg | Freq: Once | INTRAMUSCULAR | Status: DC | PRN

## 2019-01-31 MED ORDER — PHENYLEPHRINE HCL (PRESSORS) 10 MG/ML IV SOLN
INTRAVENOUS | Status: AC
  Filled 2019-01-31: qty 1

## 2019-01-31 MED ORDER — FENTANYL CITRATE (PF) 100 MCG/2ML IJ SOLN
INTRAMUSCULAR | Status: AC
  Filled 2019-01-31: qty 2

## 2019-01-31 MED ORDER — INSULIN ASPART 100 UNIT/ML ~~LOC~~ SOLN
0.0000 [IU] | Freq: Three times a day (TID) | SUBCUTANEOUS | Status: DC
  Administered 2019-02-01: 5 [IU] via SUBCUTANEOUS
  Administered 2019-02-01: 1 [IU] via SUBCUTANEOUS
  Administered 2019-02-01: 12:00:00 5 [IU] via SUBCUTANEOUS
  Administered 2019-02-02: 08:00:00 2 [IU] via SUBCUTANEOUS
  Administered 2019-02-02 (×2): 3 [IU] via SUBCUTANEOUS
  Administered 2019-02-03: 1 [IU] via SUBCUTANEOUS
  Administered 2019-02-04: 09:00:00 2 [IU] via SUBCUTANEOUS
  Administered 2019-02-04 (×2): 5 [IU] via SUBCUTANEOUS
  Administered 2019-02-05: 3 [IU] via SUBCUTANEOUS
  Administered 2019-02-05 – 2019-02-06 (×3): 2 [IU] via SUBCUTANEOUS
  Administered 2019-02-06: 5 [IU] via SUBCUTANEOUS
  Administered 2019-02-07: 12:00:00 2 [IU] via SUBCUTANEOUS
  Administered 2019-02-07: 3 [IU] via SUBCUTANEOUS
  Filled 2019-01-31 (×18): qty 1

## 2019-01-31 MED ORDER — FENTANYL CITRATE (PF) 100 MCG/2ML IJ SOLN
INTRAMUSCULAR | Status: DC | PRN
  Administered 2019-01-31 (×2): 50 ug via INTRAVENOUS

## 2019-01-31 MED ORDER — BUPIVACAINE HCL 0.25 % IJ SOLN
INTRAMUSCULAR | Status: DC | PRN
  Administered 2019-01-31: 20 mL

## 2019-01-31 MED ORDER — GEMFIBROZIL 600 MG PO TABS
600.0000 mg | ORAL_TABLET | Freq: Two times a day (BID) | ORAL | Status: DC
  Administered 2019-01-31 – 2019-02-07 (×11): 600 mg via ORAL
  Filled 2019-01-31 (×16): qty 1

## 2019-01-31 MED ORDER — METHOCARBAMOL 1000 MG/10ML IJ SOLN
500.0000 mg | Freq: Four times a day (QID) | INTRAVENOUS | Status: DC | PRN
  Filled 2019-01-31: qty 5

## 2019-01-31 MED ORDER — METHOCARBAMOL 500 MG PO TABS
500.0000 mg | ORAL_TABLET | Freq: Four times a day (QID) | ORAL | Status: DC | PRN
  Administered 2019-02-01 – 2019-02-03 (×3): 500 mg via ORAL
  Filled 2019-01-31 (×3): qty 1

## 2019-01-31 MED ORDER — OXCARBAZEPINE 300 MG PO TABS
600.0000 mg | ORAL_TABLET | Freq: Every day | ORAL | Status: DC
  Administered 2019-01-31 – 2019-02-06 (×7): 600 mg via ORAL
  Filled 2019-01-31 (×8): qty 2

## 2019-01-31 MED ORDER — LORATADINE 10 MG PO TABS
10.0000 mg | ORAL_TABLET | Freq: Every day | ORAL | Status: DC
  Administered 2019-02-01 – 2019-02-07 (×7): 10 mg via ORAL
  Filled 2019-01-31 (×7): qty 1

## 2019-01-31 MED ORDER — ASPIRIN EC 81 MG PO TBEC
81.0000 mg | DELAYED_RELEASE_TABLET | Freq: Every day | ORAL | Status: DC
  Administered 2019-02-01 – 2019-02-07 (×7): 81 mg via ORAL
  Filled 2019-01-31 (×7): qty 1

## 2019-01-31 MED ORDER — ALUM & MAG HYDROXIDE-SIMETH 200-200-20 MG/5ML PO SUSP: 30.0000 mL | ORAL | Status: DC | PRN

## 2019-01-31 MED ORDER — MIDAZOLAM HCL 2 MG/2ML IJ SOLN
INTRAMUSCULAR | Status: AC
  Filled 2019-01-31: qty 2

## 2019-01-31 MED ORDER — LIDOCAINE HCL (CARDIAC) PF 100 MG/5ML IV SOSY
PREFILLED_SYRINGE | INTRAVENOUS | Status: DC | PRN
  Administered 2019-01-31: 100 mg via INTRAVENOUS

## 2019-01-31 MED ORDER — NEOMYCIN-POLYMYXIN B GU 40-200000 IR SOLN
Status: DC | PRN
  Administered 2019-01-31: 4 mL

## 2019-01-31 MED ORDER — VANCOMYCIN HCL IN DEXTROSE 1-5 GM/200ML-% IV SOLN
1000.0000 mg | Freq: Two times a day (BID) | INTRAVENOUS | Status: AC
  Filled 2019-01-31: qty 200

## 2019-01-31 MED ORDER — SODIUM CHLORIDE 0.9 % IV SOLN
INTRAVENOUS | Status: DC | PRN
  Administered 2019-01-31: 13:00:00 40 ug/min via INTRAVENOUS

## 2019-01-31 MED ORDER — BUPIVACAINE HCL (PF) 0.25 % IJ SOLN
INTRAMUSCULAR | Status: AC
  Filled 2019-01-31: qty 30

## 2019-01-31 SURGICAL SUPPLY — 62 items
BIT DRILL 2.5X110 QC LCP DISP (BIT) ×2 IMPLANT
BIT DRILL CANN 2.7X625 NONSTRL (BIT) ×2 IMPLANT
BIT DRILL LCP QC 2X140 (BIT) ×2 IMPLANT
BIT DRILL QC 2X125 (BIT) IMPLANT
BLADE SURG 15 STRL LF DISP TIS (BLADE) ×1 IMPLANT
BLADE SURG 15 STRL SS (BLADE) ×8
BNDG ELASTIC 4X5.8 VLCR NS LF (GAUZE/BANDAGES/DRESSINGS) ×6 IMPLANT
BNDG ESMARK 6X12 TAN STRL LF (GAUZE/BANDAGES/DRESSINGS) ×3 IMPLANT
CLOSURE WOUND 1/2 X4 (GAUZE/BANDAGES/DRESSINGS) ×2
COVER WAND RF STERILE (DRAPES) ×3 IMPLANT
CUFF TOURN SGL QUICK 24 (TOURNIQUET CUFF)
CUFF TOURN SGL QUICK 30 (TOURNIQUET CUFF)
CUFF TOURN SGL QUICK 34 (TOURNIQUET CUFF) ×2
CUFF TRNQT CYL 24X4X16.5-23 (TOURNIQUET CUFF) IMPLANT
CUFF TRNQT CYL 30X4X21-28X (TOURNIQUET CUFF) IMPLANT
CUFF TRNQT CYL 34X4.125X (TOURNIQUET CUFF) IMPLANT
DRAPE FLUOR MINI C-ARM 54X84 (DRAPES) ×3 IMPLANT
DRAPE INCISE IOBAN 66X45 STRL (DRAPES) ×3 IMPLANT
DRAPE U-SHAPE 47X51 STRL (DRAPES) ×3 IMPLANT
DRILL BIT QC 2X125 (BIT) ×2
DURAPREP 26ML APPLICATOR (WOUND CARE) ×6 IMPLANT
ELECT REM PT RETURN 9FT ADLT (ELECTROSURGICAL) ×3
ELECTRODE REM PT RTRN 9FT ADLT (ELECTROSURGICAL) ×1 IMPLANT
GAUZE SPONGE 4X4 12PLY STRL (GAUZE/BANDAGES/DRESSINGS) ×3 IMPLANT
GAUZE XEROFORM 1X8 LF (GAUZE/BANDAGES/DRESSINGS) ×3 IMPLANT
GLOVE BIOGEL PI IND STRL 9 (GLOVE) ×1 IMPLANT
GLOVE BIOGEL PI INDICATOR 9 (GLOVE) ×2
GLOVE SURG 9.0 ORTHO LTXF (GLOVE) ×6 IMPLANT
GOWN STRL REUS TWL 2XL XL LVL4 (GOWN DISPOSABLE) ×3 IMPLANT
GOWN STRL REUS W/ TWL LRG LVL3 (GOWN DISPOSABLE) ×1 IMPLANT
GOWN STRL REUS W/TWL LRG LVL3 (GOWN DISPOSABLE) ×2
GUIDEWIRE THREADED 150MM (WIRE) ×4 IMPLANT
HANDLE YANKAUER SUCT BULB TIP (MISCELLANEOUS) ×2 IMPLANT
KIT TURNOVER KIT A (KITS) ×3 IMPLANT
LABEL OR SOLS (LABEL) ×3 IMPLANT
NS IRRIG 1000ML POUR BTL (IV SOLUTION) ×3 IMPLANT
PACK EXTREMITY ARMC (MISCELLANEOUS) ×3 IMPLANT
PAD ABD DERMACEA PRESS 5X9 (GAUZE/BANDAGES/DRESSINGS) ×6 IMPLANT
PAD CAST CTTN 4X4 STRL (SOFTGOODS) ×3 IMPLANT
PADDING CAST COTTON 4X4 STRL (SOFTGOODS) ×6
SCREW CANN L THRD/30 4.0 (Screw) ×4 IMPLANT
SCREW CORT LP ST 3.5X16 (Screw) ×4 IMPLANT
SCREW CORTEX 3.5 28MM (Screw) ×2 IMPLANT
SCREW CORTEX 3.5 32MM (Screw) ×2 IMPLANT
SCREW LOCK CORT ST 3.5X28 (Screw) IMPLANT
SCREW LOCK CORT ST 3.5X32 (Screw) IMPLANT
SCREW LOCK VA ST 2.7X10 (Screw) ×2 IMPLANT
SCREW LOCK VA ST 2.7X14 (Screw) ×6 IMPLANT
SCREW LOCK VA ST 2.7X18 (Screw) ×2 IMPLANT
SCREW LOCKING 2.7X16MM VA (Screw) ×4 IMPLANT
SCREW SELF TAP 14MM (Screw) ×2 IMPLANT
SPLINT CAST 1 STEP 4X30 (MISCELLANEOUS) ×6 IMPLANT
SPONGE LAP 18X18 RF (DISPOSABLE) ×7 IMPLANT
STAPLER SKIN PROX 35W (STAPLE) ×3 IMPLANT
STOCKINETTE STRL 6IN 960660 (GAUZE/BANDAGES/DRESSINGS) ×3 IMPLANT
STRIP CLOSURE SKIN 1/2X4 (GAUZE/BANDAGES/DRESSINGS) ×4 IMPLANT
SUT ETH BLK MONO 3 0 FS 1 12/B (SUTURE) ×6 IMPLANT
SUT VIC AB 2-0 SH 27 (SUTURE) ×4
SUT VIC AB 2-0 SH 27XBRD (SUTURE) ×2 IMPLANT
SYNTHES 5 HOLE PLATE ×2 IMPLANT
SYR 30ML LL (SYRINGE) ×3 IMPLANT
TAPE MICROPORE 2IN (TAPE) ×3 IMPLANT

## 2019-01-31 NOTE — Op Note (Signed)
01/31/2019  4:01 PM  PATIENT:  Victor Wise    PRE-OPERATIVE DIAGNOSIS:  F74.944H displaced bimalleolar fracture of left lower leg closed fracture    POST-OPERATIVE DIAGNOSIS:  Same  PROCEDURE:  open reduction internal fixation left bimalleolar ankle fracture  SURGEON:  Thornton Park, MD  ANESTHESIA:   General  PREOPERATIVE INDICATIONS:  Victor Wise is a  68 y.o. male with a diagnosis of S82.842G displaced bimalleolar fracture of left lower leg closed fracture.  The fracture was initially sustained on 01/03/2019.  Initially in the emergency department it was deemed as nondisplaced.  However upon follow-up the patient had displacement of the lateral malleolus with lateral translation of the talus.  Given the fracture displacement it was recommended the patient undergo surgical fixation for this fracture.  I discussed the risks and benefits of surgery. The risks include but are not limited to infection, bleeding requiring blood transfusion, nerve or blood vessel injury, joint stiffness or loss of motion, persistent pain, weakness or instability, malunion, nonunion and hardware failure and the need for further surgery. Medical risks include but are not limited to DVT and pulmonary embolism, myocardial infarction, stroke, pneumonia, respiratory failure and death. Patient understood these risks and wished to proceed.   OPERATIVE IMPLANTS: Synthes LCP distal fibula plate for lateral fixation and 4.0 cannulated screws x 2 for medial fixation.  OPERATIVE FINDINGS: Abundant callus formation around the lateral malleolus fracture.  Displaced bimalleolar ankle fracture with lateral talar subluxation  OPERATIVE PROCEDURE:   Patient was met in the preoperative area.  A preop history and physical was performed at the bedside.  The left leg was signed my initials and the word yes according the hospital's correct site of surgery protocol.  Patient's cast was bivalved in the preop area by me.  He was brought  to the operating room where he underwent general anesthesia. The patient was placed supine on the operative table. A bump was placed under the left hip. A tourniquet was applied to the left thigh.  The lower extremity was prepped and draped in a sterile fashion. A timeout was performed to verify the patient's name, date of birth, medical record number, correct site of surgery and correct procedure to be performed. It was also used to verify the patient received antibiotics, and that all appropriate instruments, implants and radiographic studies were available in the room. Once all in attendance were in agreement, the case began.  The left lower extremity was exsanguinated with an Esmarch. The tourniquet was inflated to 300 mmHg. This was applied for a total of 133 minutes. A lateral incision was made over the fibula. The subcutaneous tissues were dissected with the Metzenbaum scissor and pickup. Care was taken to avoid injury to the superficial peroneal nerve. The lateral malleolus fracture was identified.  Abundant callus formation was noted at the fracture site.  This had to be debrided with a rondure curette and osteotome.  Once the fracture callus had been removed and the distal fracture fragment mobilized, manual reduction of the lateral malleolus was performed and held into place with a fracture reduction clamp.  Its position was confirmed on FluoroScan imaging.    The lateral malleolus was then drilled in an AP direction, perpendicular to the fracture site to allow for placement of the lag screw.   A single lag screw, 28 mm in length, was advanced across the fracture site by hand. This compressed the fracture.   A Synthes 5 hole distal fibular plate was applied to  the lateral malleolus.  K wires were used to stabilize this plate alongside the fibula.  A single bicortical screw was placed to reduce the plate to the lateral malleolus.  5 of 6 distal locking screws were placed.  The position and length of  these was confirmed on FluoroScan imaging.  Five proximal bicortical screws were then placed above the fracture along the fibular shaft.  The fracture reduction and hardware placement were confirmed on AP and lateral imaging.  Once the lateral malleolus was plated, the attention was turned to the medial ankle. A small vertical incision was made over the tip of the medial malleolus.  Soft tissue was dissected with some with the Metzenbaum scissor and pickup. The fracture of the medial malleolus was identified. This was reduced with a dental pick. 2 threaded K wires for the 4.0 cannulated screws were then advanced through the tip of the medial malleolus across the fracture site and into the distal tibia. The position of the K wires was evaluated on AP and lateral FluoroScan images. The length of the wires were measured with a depth gauge and were both determined to be 30 mm in length. The wires were then overdrilled with a cannulated drill for the 4.0 cannulated screws. The two long threaded 4.0 cannulated screws were then advanced into position by hand, compressing the medial malleolus fracture.    A stress test of the right ankle was then performed under fluoroscopy.  This test did not reveal any syndesmotic injury or opening of the medial clear space.  The medial and lateral incisions were then copiously irrigated. The subcutaneous tissue was closed with 2-0 Vicryl and the skin approximated staples. A dry sterile dressing was applied along with an AO splint. The patient's ankle was positioned in neutral. The pateint was then awoken from anesthesia, transferred to hospital bed and brought to the PACU in stable condition. I was scrubbed and present the entire case and all sharp and instrument counts were correct at conclusion the case. I spoke to the patient's daughter and son-in-law by phone from the PACU to let them know the case was performed without complication and the patient was stable in recovery room.      [name], MD

## 2019-01-31 NOTE — TOC Progression Note (Signed)
Transition of Care [facility]) - Progression Note    Patient Details  Name: Victor Wise MRN: [id number] Date of Birth: 1951/04/14  [facility] CM/SW Contact  Dmya Long, Lenice Llamas Phone Number: [phone]  01/31/2019, 3:56 PM  Clinical Narrative: Clinical Social Worker (CSW) received a call from Dr. Mack Guise stating that patient needs SNF. Per MD patient has an old ankle fracture that had to be fixed today because he has not been able to take care of himself at home. FL2 complete and faxed out. Patient has regular [employer] that is not managed by [employer]. CSW will continue to follow and assist as needed.             Expected Discharge Plan and Services                                                 Social Determinants of Health (SDOH) Interventions    Readmission Risk Interventions No flowsheet data found.

## 2019-01-31 NOTE — Anesthesia Procedure Notes (Signed)
Procedure Name: Intubation Date/Time: 01/31/2019 12:30 PM Performed by: Chanetta Marshall, CRNA Pre-anesthesia Checklist: Patient identified, Emergency Drugs available, Suction available and Patient being monitored Patient Re-evaluated:Patient Re-evaluated prior to induction Oxygen Delivery Method: Circle system utilized Preoxygenation: Pre-oxygenation with 100% oxygen Induction Type: IV induction Laryngoscope Size: McGraph and 3 Tube type: Oral Number of attempts: 1 Airway Equipment and Method: Stylet,  Oral airway and Video-laryngoscopy Placement Confirmation: ETT inserted through vocal cords under direct vision,  positive ETCO2 and breath sounds checked- equal and bilateral Secured at: 22 cm Tube secured with: Tape Dental Injury: Teeth and Oropharynx as per pre-operative assessment

## 2019-01-31 NOTE — H&P (Signed)
PREOPERATIVE H&P  Chief Complaint: [id number] displaced bimalleolar fracture of left lower leg closed fracture with delayed healing  HPI: Victor Wise is a 68 y.o. male who presents for preoperative history and physical with a diagnosis of S82.842G displaced bimalleolar fracture of left lower leg closed fracture with delayed healing. Patient's fracture has displaced with lateral talar subluxation.   He has agreed with surgical management.   Past Medical History:  Diagnosis Date  . Allergy   . Basal cell carcinoma    forehead  . BPH (benign prostatic hyperplasia)   . Chronic back pain   . Depression   . Diabetes ([place])   . GERD (gastroesophageal reflux disease)   . Heart attack ([place])    2018  . Hypertension   . Morbid obesity ([place])   . Obstructive sleep apnea   . 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 Status post insertion of spinal cord stimulator   . Stroke [facility])    last one 2-3 years ago  . UTI (lower urinary tract infection)    Past Surgical History:  Procedure Laterality Date  . AMPUTATION TOE Left 04/19/2018   Procedure: AMPUTATION TOE-LEFT GREAT TOE;  Surgeon: Sharlotte Alamo, DPM;  Location: [facility] OR[facility];  Service: Podiatry;  Laterality: Left;  . CARDIAC CATHETERIZATION N/A 12/19/2014   Procedure: Coronary Stent Intervention;  Surgeon: Charolette Forward, MD;  Location: [place] [facility];  Service: Cardiovascular;  Laterality: N/A;  . CARDIAC CATHETERIZATION Left 12/19/2014   Procedure: Left Heart Cath and Coronary Angiography;  Surgeon: Dionisio Jahki, MD;  Location: [place] [facility];  Service: Cardiovascular;  Laterality: Left;  . IRRIGATION AND DEBRIDEMENT FOOT Left 03/02/2018   Procedure: IRRIGATION AND DEBRIDEMENT FOOT;  Surgeon: Samara Deist, DPM;  Location: [facility];  Service: Podiatry;  Laterality: Left;  . IRRIGATION AND DEBRIDEMENT FOOT Left 03/08/2018   Procedure: IRRIGATION AND DEBRIDEMENT FOOT AND BONE;  Surgeon: Sharlotte Alamo, DPM;  Location: [facility];  Service: Podiatry;   Laterality: Left;  . IRRIGATION AND DEBRIDEMENT FOOT Left 04/19/2018   Procedure: IRRIGATION AND DEBRIDEMENT FOOT;  Surgeon: Sharlotte Alamo, DPM;  Location: [facility];  Service: Podiatry;  Laterality: Left;  . LOWER EXTREMITY ANGIOGRAPHY Left 03/05/2018   Procedure: Lower Extremity Angiography;  Surgeon: Algernon Huxley, MD;  Location: [place] [facility];  Service: Cardiovascular;  Laterality: Left;  . LOWER EXTREMITY ANGIOGRAPHY Left 03/08/2018   Procedure: LOWER EXTREMITY ANGIOGRAPHY;  Surgeon: Algernon Huxley, MD;  Location: [place] [facility];  Service: Cardiovascular;  Laterality: Left;  . PACEMAKER INSERTION Left 11/02/2015   Procedure: INSERTION PACEMAKER;  Surgeon: Isaias Cowman, MD;  Location: [facility];  Service: Cardiovascular;  Laterality: Left;  . Pain Stimulator    . Right toe amputation     Social History   Socioeconomic History  . Marital status: Divorced    Spouse name: Not on file  . Number of children: Not on file  . Years of education: Not on file  . Highest education level: Not on file  Occupational History  . Not on file  Social Needs  . Financial resource strain: Not on file  . Food insecurity    Worry: Not on file    Inability: Not on file  . Transportation needs    Medical: Not on file    Non-medical: Not on file  Tobacco Use  . Smoking status: Former Smoker    Types: Cigarettes  . Smokeless tobacco: Never Used  . Tobacco comment: quit 45 years  Substance and Sexual Activity  . Alcohol use: Not Currently    Alcohol/week: 0.0 standard drinks    Comment: have not had alcohol in 51yrs  . Drug use: No  . Sexual activity: Not on file  Lifestyle  . Physical activity    Days per week: Not on file    Minutes per session: Not on file  . Stress: Not on file  Relationships  . Social Herbalist on phone: Not on file    Gets together: Not on file    Attends religious service: Not on file    Active member of club or organization: Not on file     Attends meetings of clubs or organizations: Not on file    Relationship status: Not on file  Other Topics Concern  . Not on file  Social History Narrative  . Not on file   Family History  Problem Relation Age of Onset  . Breast cancer Mother   . Cancer Mother   . Hypertension Mother   . Lung cancer Father   . Hypertension Father   . Heart disease Father   . Cancer Father   . Kidney disease Sister   . Prostate cancer Neg Hx    Allergies  Allergen Reactions  . Amoxicillin Other (See Comments)    Has never taken amoxicillin -ENT told him not to take (? Had skin test) Has patient had a PCN reaction causing immediate rash, facial/tongue/throat swelling, SOB or lightheadedness with hypotension: No Has patient had a PCN reaction causing severe rash involving mucus membranes or skin necrosis: No Has patient had a PCN reaction that required hospitalization No Has patient had a PCN reaction occurring within the last 10 years: No If above answers are "NO", then may proceed w/ Cephalo  . Other Anaphylaxis, Itching and Other (See Comments)    Pt states that he is allergic to Endopa.  Reaction:  Anaphylaxis  Pt states that he is allergic to Metabisulfites Reaction:  Itching   . Penicillins Other (See Comments)    Arm turned "blue" at injection site (no treatment needed) Has patient had a PCN reaction causing immediate rash, facial/tongue/throat swelling, SOB or lightheadedness with hypotension: No Has patient had a PCN reaction causing severe rash involving mucus membranes or skin necrosis: No Has patient had a PCN reaction that required hospitalization No Has patient had a PCN reaction occurring within the last 10 years: No If all of the above answers are "NO", then may proceed with Cephalosporin use.  [name] Hydralazine Other (See Comments)    Reaction:  Cramping of extremities Pt reports tolerating low dose 12.5mg  but no higher.  . Cephalexin Itching  . Clindamycin Itching  . Crestor  [Rosuvastatin Calcium] Other (See Comments)    Reaction:  Pt is unable to move arms/legs   . Metoprolol Nausea And Vomiting  . Red Dye Itching  . Statins Other (See Comments)    Reaction:  Pt is unable to move arms/legs  . Sulfa Antibiotics Itching    Other reaction(s): Unknown  . Yellow Dyes (Non-Tartrazine) Itching  . Aspirin Itching and Other (See Comments)    Pt states that he is able to use in lower doses.    . Tape Rash    Please use "paper" tape only. Please use "paper" tape only.   Prior to Admission medications   Medication Sig Start Date End Date Taking? Authorizing Provider  acetaminophen (TYLENOL) 500 MG tablet Take 1,000 mg by mouth every  8 (eight) hours as needed for moderate pain.   Yes [provider]  amLODipine (NORVASC) 10 MG tablet TAKE 1 TABLET BY MOUTH ONCE DAILY 12/03/18  Yes Kendell Bane, NP  aspirin EC 81 MG tablet Take 81 mg by mouth daily.   Yes [provider]  benazepril (LOTENSIN) 20 MG tablet Take 1 tablet (20 mg total) by mouth 2 (two) times daily. 10/01/18  Yes Scarboro, Audie Clear, NP  cetirizine (ZYRTEC) 10 MG tablet Take 10 mg by mouth daily as needed for allergies.   Yes [provider]  clopidogrel (PLAVIX) 75 MG tablet Take 1 tablet (75 mg total) by mouth daily. 06/28/18  Yes Boscia, Greer Ee, NP  DULoxetine (CYMBALTA) 30 MG capsule Take 30-60 mg by mouth See admin instructions. 60 mg in the morning, 30 mg in the evening   Yes [provider]  ezetimibe (ZETIA) 10 MG tablet Take 10 mg by mouth daily.  10/29/18  Yes [provider]  fenofibrate 54 MG tablet TAKE 1 TABLET BY MOUTH A DAY AT SUPPERTIME 12/03/18  Yes Scarboro, Audie Clear, NP  fluticasone (FLONASE) 50 MCG/ACT nasal spray Place 2 sprays into both nostrils daily as needed for allergies.    Yes [provider]  furosemide (LASIX) 20 MG tablet Take 20 mg by mouth daily. 10/01/18  Yes [provider]  gemfibrozil (LOPID) 600 MG tablet Take 1  tablet (600 mg total) by mouth 2 (two) times daily before a meal. 12/12/18  Yes Scarboro, Audie Clear, NP  glucose blood (TRUE METRIX BLOOD GLUCOSE TEST) test strip Use as instructed three times a day diag e11.65 08/13/18  Yes Boscia, Heather E, NP  hydrochlorothiazide (HYDRODIURIL) 12.5 MG tablet Take 1 tablet (12.5 mg total) by mouth daily. 08/09/18  Yes Boscia, Greer Ee, NP  Insulin Glargine (LANTUS SOLOSTAR) 100 UNIT/ML Solostar Pen Inject 71 Units into the skin at bedtime. 01/07/19  Yes Scarboro, Audie Clear, NP  Insulin Pen Needle (B-D UF III MINI PEN NEEDLES) 31G X 5 MM MISC Use as directed with insulin e11.65 12/26/17  Yes Boscia, Heather E, NP  insulin regular (NOVOLIN R) 100 units/mL injection Patient to use Novolin R [place] TiD prior to meals per sliding scale instructions. Max daily dose is 45 units per day. 06/22/18  Yes Scarboro, Audie Clear, NP  INSULIN SYRINGE 1CC/29G 29G X 1/2" 1 ML MISC Insulin injections QID, use with lantus and regular insulin. DX E11.65 07/05/18  Yes Scarboro, Audie Clear, NP  Insulin Syringe-Needle U-100 (BD INSULIN SYRINGE U/F 1/2UNIT) 31G X 5/16" 0.3 ML MISC Indulin injection QID. Use with lantus and regular insulin Dx. 11.65 01/08/18  Yes Lavera Guise, MD  omega-3 acid ethyl esters (LOVAZA) 1 g capsule Take 1 capsule (1 g total) by mouth 2 (two) times daily. 01/24/19  Yes Scarboro, Audie Clear, NP  Oxcarbazepine (TRILEPTAL) 300 MG tablet Take 300-600 mg by mouth See admin instructions. Take 1 tablet (300MG ) by mouth every morning and 2 tablets (600MG ) by mouth every night   Yes [provider]  oxyCODONE (OXY IR/ROXICODONE) 5 MG immediate release tablet Take 1 tablet (5 mg total) by mouth every 4 (four) hours as needed for moderate pain. 04/23/18  Yes Gouru, Illene Silver, MD  oxyCODONE (OXYCONTIN) 20 mg 12 hr tablet Take 20 mg by mouth every 12 (twelve) hours.  06/09/18  Yes [provider]  pantoprazole (PROTONIX) 40 MG tablet TAKE 1 TABLET BY MOUTH A DAY FOR REFLUX 09/07/18  Yes Scarboro,  Audie Clear, NP  polyethylene glycol (MIRALAX / GLYCOLAX) packet Take 17 g by mouth daily.    Yes [provider]  pregabalin (LYRICA) 200 MG capsule Take 200-400 mg by mouth See admin instructions. 400 mg in the morning, 200 mg in the evening   Yes [provider]  tamsulosin (FLOMAX) 0.4 MG CAPS capsule Take 1 capsule (0.4 mg total) by mouth daily after supper. 09/07/18  Yes Scarboro, Audie Clear, NP  Exenatide ER (BYDUREON) 2 MG PEN Inject 2 mg into the skin once a week. 11/26/18   Kendell Bane, NP  lidocaine (LIDODERM) 5 % Place 1 patch onto the skin daily as needed (pain). Remove & Discard patch within 12 hours or as directed by MD    [provider]  NARCAN 4 MG/0.1ML LIQD Take 4 mg by mouth as needed (for opioid overdose).     [provider]  nitroGLYCERIN (NITROSTAT) 0.4 MG SL tablet Place 1 tablet (0.4 mg total) under the tongue every 5 (five) minutes x 3 doses as needed for chest pain. Patient not taking: Reported on 01/31/2019 12/21/14   Dixie Dials, MD     Positive ROS: All other systems have been reviewed and were otherwise negative with the exception of those mentioned in the HPI and as above.  Physical Exam: General: Alert, no acute distress Cardiovascular: Regular rate and rhythm, no murmurs rubs or gallops.  No pedal edema Respiratory: Clear to auscultation bilaterally, no wheezes rales or rhonchi. No cyanosis, no use of accessory musculature GI: No organomegaly, abdomen is soft and non-tender nondistended with positive bowel sounds. Skin: Skin intact, no lesions within the operative field. Neurologic: Sensation intact distally Psychiatric: Patient is competent for consent with normal mood and affect Lymphatic: No cervical lymphadenopathy  MUSCULOSKELETAL: Left ankle:  Hallux previously amputated.  Foot perfused.  Skin intact.  Compartments soft.  Intact sensation to light touch but with some baseline diabetic neuropathy.  Assessment: S82.842G  displaced bimalleolar fracture of left lower leg closed fracture with delayed healing  Plan: Plan for Procedure(s): OPEN REDUCTION INTERNAL FIXATION (ORIF) LEFT BIMALLEOLAR ANKLE FRACTURE  I reviewed the details of the operation as well as the postoperative course with the patient.  I discussed the risks and benefits of surgery. The risks include but are not limited to infection, bleeding requiring blood transfusion, nerve or blood vessel injury, joint stiffness or loss of motion, persistent pain, weakness or instability, malunion, nonunion and hardware failure and the need for further surgery. Medical risks include but are not limited to DVT and pulmonary embolism, myocardial infarction, stroke, pneumonia, respiratory failure and death. Patient understood these risks and wished to proceed.  Patient was cleared by his PCP and cardiology.  He is deemed moderate risk for surgery.  Patient's pacemaker has been interrogated in the preoperative area.    Thornton Park, MD   01/31/2019 12:06 PM

## 2019-01-31 NOTE — Anesthesia Post-op Follow-up Note (Signed)
Anesthesia QCDR form completed.        

## 2019-01-31 NOTE — Progress Notes (Signed)
Family Meeting Note  Advance Directive:yes  Today a meeting took place with the Patient.   The following clinical team members were present during this meeting:MD  The following were discussed:Patient's diagnosis: Sleep apnea, CHF, coronary artery disease and status post stent, diabetes, hypertension, ankle fracture surgery, Patient's progosis: Unable to determine and Goals for treatment: Full Code  Additional follow-up to be provided: PMD  Time spent during discussion:20 minutes  Vaughan Basta, MD

## 2019-01-31 NOTE — Consult Note (Signed)
[facility] [place] NAME: Victor Wise    MR#:  [id number]  DATE OF BIRTH:  1951/03/16  DATE OF ADMISSION:  01/31/2019  PRIMARY CARE PHYSICIAN: Lavera Guise, MD   REQUESTING/REFERRING PHYSICIAN: Mack Guise  CHIEF COMPLAINT:  No chief complaint on file.   HISTORY OF PRESENT ILLNESS: Victor Wise  is a 68 y.o. male with a known history of depression, diabetes, gastroesophageal reflux disease, coronary artery disease, hypertension, morbid obesity, obstructive sleep apnea, stroke, UTI, coronary artery disease status post stent, diastolic congestive heart failure-had left ankle fracture nonhealing so scheduled to have surgery as outpatient. Surgery is done today by Dr. Revonda Humphrey consult is called in because of his multiple medical issues and to help manage them postoperatively in hospital.  PAST MEDICAL HISTORY:   Past Medical History:  Diagnosis Date  . Allergy   . Basal cell carcinoma    forehead  . BPH (benign prostatic hyperplasia)   . CHF (congestive heart failure) ([place])   . Chronic back pain   . Coronary artery disease   . Depression   . Diabetes ([place])   . GERD (gastroesophageal reflux disease)   . Heart attack ([place])    2018  . Hypertension   . Morbid obesity ([place])   . Obstructive sleep apnea   . 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 Status post insertion of spinal cord stimulator   . Stroke [facility])    last one 2-3 years ago  . UTI (lower urinary tract infection)     PAST SURGICAL HISTORY:  Past Surgical History:  Procedure Laterality Date  . AMPUTATION TOE Left 04/19/2018   Procedure: AMPUTATION TOE-LEFT GREAT TOE;  Surgeon: Sharlotte Alamo, DPM;  Location: [facility] ORS;  Service: Podiatry;  Laterality: Left;  . CARDIAC CATHETERIZATION N/A 12/19/2014   Procedure: Coronary Stent Intervention;  Surgeon: Charolette Forward, MD;  Location: [place] [facility] LAB;  Service: Cardiovascular;  Laterality: N/A;  . CARDIAC CATHETERIZATION Left 12/19/2014    Procedure: Left Heart Cath and Coronary Angiography;  Surgeon: Dionisio Peighton, MD;  Location: [place] [facility];  Service: Cardiovascular;  Laterality: Left;  . IRRIGATION AND DEBRIDEMENT FOOT Left 03/02/2018   Procedure: IRRIGATION AND DEBRIDEMENT FOOT;  Surgeon: Samara Deist, DPM;  Location: [facility] ORS;  Service: Podiatry;  Laterality: Left;  . IRRIGATION AND DEBRIDEMENT FOOT Left 03/08/2018   Procedure: IRRIGATION AND DEBRIDEMENT FOOT AND BONE;  Surgeon: Sharlotte Alamo, DPM;  Location: [facility] ORS;  Service: Podiatry;  Laterality: Left;  . IRRIGATION AND DEBRIDEMENT FOOT Left 04/19/2018   Procedure: IRRIGATION AND DEBRIDEMENT FOOT;  Surgeon: Sharlotte Alamo, DPM;  Location: [facility] ORS;  Service: Podiatry;  Laterality: Left;  . LOWER EXTREMITY ANGIOGRAPHY Left 03/05/2018   Procedure: Lower Extremity Angiography;  Surgeon: Algernon Huxley, MD;  Location: [place] [facility];  Service: Cardiovascular;  Laterality: Left;  . LOWER EXTREMITY ANGIOGRAPHY Left 03/08/2018   Procedure: LOWER EXTREMITY ANGIOGRAPHY;  Surgeon: Algernon Huxley, MD;  Location: [place] [facility];  Service: Cardiovascular;  Laterality: Left;  . PACEMAKER INSERTION Left 11/02/2015   Procedure: INSERTION PACEMAKER;  Surgeon: Isaias Cowman, MD;  Location: [facility] ORS;  Service: Cardiovascular;  Laterality: Left;  . Pain Stimulator    . Right toe amputation      SOCIAL HISTORY:  Social History   Tobacco Use  . Smoking status: Former Smoker    Types: Cigarettes  . Smokeless tobacco: Never Used  . Tobacco comment: quit 45 years  Substance Use Topics  . Alcohol use: Not Currently    Alcohol/week: 0.0 standard drinks    Comment: have not had alcohol in 26yrs    FAMILY HISTORY:  Family History  Problem Relation Age of Onset  . Breast cancer Mother   . Cancer Mother   . Hypertension Mother   . Lung cancer Father   . Hypertension Father   . Heart disease Father   . Cancer Father   . Kidney disease Sister   . Prostate cancer  Neg Hx     DRUG ALLERGIES:  Allergies  Allergen Reactions  . Amoxicillin Other (See Comments)    Has never taken amoxicillin -ENT told him not to take (? Had skin test) Has patient had a PCN reaction causing immediate rash, facial/tongue/throat swelling, SOB or lightheadedness with hypotension: No Has patient had a PCN reaction causing severe rash involving mucus membranes or skin necrosis: No Has patient had a PCN reaction that required hospitalization No Has patient had a PCN reaction occurring within the last 10 years: No If above answers are "NO", then may proceed w/ Cephalo  . Other Anaphylaxis, Itching and Other (See Comments)    Pt states that he is allergic to Endopa.  Reaction:  Anaphylaxis  Pt states that he is allergic to Metabisulfites Reaction:  Itching   . Penicillins Other (See Comments)    Arm turned "blue" at injection site (no treatment needed) Has patient had a PCN reaction causing immediate rash, facial/tongue/throat swelling, SOB or lightheadedness with hypotension: No Has patient had a PCN reaction causing severe rash involving mucus membranes or skin necrosis: No Has patient had a PCN reaction that required hospitalization No Has patient had a PCN reaction occurring within the last 10 years: No If all of the above answers are "NO", then may proceed with Cephalosporin use.  [name] Hydralazine Other (See Comments)    Reaction:  Cramping of extremities Pt reports tolerating low dose 12.5mg  but no higher.  . Cephalexin Itching  . Clindamycin Itching  . Crestor [Rosuvastatin Calcium] Other (See Comments)    Reaction:  Pt is unable to move arms/legs   . Metoprolol Nausea And Vomiting  . Red Dye Itching  . Statins Other (See Comments)    Reaction:  Pt is unable to move arms/legs  . Sulfa Antibiotics Itching    Other reaction(s): Unknown  . Yellow Dyes (Non-Tartrazine) Itching  . Aspirin Itching and Other (See Comments)    Pt states that he is able to use in lower  doses.    . Tape Rash    Please use "paper" tape only. Please use "paper" tape only.    REVIEW OF SYSTEMS:   CONSTITUTIONAL: No fever, fatigue or weakness.  EYES: No blurred or double vision.  EARS, NOSE, AND THROAT: No tinnitus or ear pain.  RESPIRATORY: No cough, shortness of breath, wheezing or hemoptysis.  CARDIOVASCULAR: No chest pain, orthopnea, edema.  GASTROINTESTINAL: No nausea, vomiting, diarrhea or abdominal pain.  GENITOURINARY: No dysuria, hematuria.  ENDOCRINE: No polyuria, nocturia,  HEMATOLOGY: No anemia, easy bruising or bleeding SKIN: No rash or lesion. MUSCULOSKELETAL: No joint pain or arthritis.   NEUROLOGIC: No tingling, numbness, weakness.  PSYCHIATRY: No anxiety or depression.   MEDICATIONS AT HOME:  Prior to Admission medications   Medication Sig Start Date End Date Taking? Authorizing Provider  acetaminophen (TYLENOL) 500 MG tablet Take 1,000 mg by mouth every 8 (eight) hours as needed for moderate pain.   Yes [provider]  amLODipine (NORVASC) 10 MG tablet TAKE 1 TABLET BY MOUTH ONCE DAILY 12/03/18  Yes Kendell Bane, NP  aspirin EC 81 MG tablet Take 81 mg by mouth daily.   Yes [provider]  benazepril (LOTENSIN) 20 MG tablet Take 1 tablet (20 mg total) by mouth 2 (two) times daily. 10/01/18  Yes Scarboro, Audie Clear, NP  cetirizine (ZYRTEC) 10 MG tablet Take 10 mg by mouth daily as needed for allergies.   Yes [provider]  clopidogrel (PLAVIX) 75 MG tablet Take 1 tablet (75 mg total) by mouth daily. 06/28/18  Yes Boscia, Greer Ee, NP  DULoxetine (CYMBALTA) 30 MG capsule Take 30-60 mg by mouth See admin instructions. 60 mg in the morning, 30 mg in the evening   Yes [provider]  ezetimibe (ZETIA) 10 MG tablet Take 10 mg by mouth daily.  10/29/18  Yes [provider]  fenofibrate 54 MG tablet TAKE 1 TABLET BY MOUTH A DAY AT SUPPERTIME 12/03/18  Yes Scarboro, Audie Clear, NP  fluticasone (FLONASE) 50 MCG/ACT  nasal spray Place 2 sprays into both nostrils daily as needed for allergies.    Yes [provider]  furosemide (LASIX) 20 MG tablet Take 20 mg by mouth daily. 10/01/18  Yes [provider]  gemfibrozil (LOPID) 600 MG tablet Take 1 tablet (600 mg total) by mouth 2 (two) times daily before a meal. 12/12/18  Yes Scarboro, Audie Clear, NP  glucose blood (TRUE METRIX BLOOD GLUCOSE TEST) test strip Use as instructed three times a day diag e11.65 08/13/18  Yes Boscia, Heather E, NP  Insulin Glargine (LANTUS SOLOSTAR) 100 UNIT/ML Solostar Pen Inject 71 Units into the skin at bedtime. 01/07/19  Yes Scarboro, Audie Clear, NP  Insulin Pen Needle (B-D UF III MINI PEN NEEDLES) 31G X 5 MM MISC Use as directed with insulin e11.65 12/26/17  Yes Boscia, Heather E, NP  insulin regular (NOVOLIN R) 100 units/mL injection Patient to use Novolin R [place] TiD prior to meals per sliding scale instructions. Max daily dose is 45 units per day. 06/22/18  Yes Scarboro, Audie Clear, NP  INSULIN SYRINGE 1CC/29G 29G X 1/2" 1 ML MISC Insulin injections QID, use with lantus and regular insulin. DX E11.65 07/05/18  Yes Scarboro, Audie Clear, NP  Insulin Syringe-Needle U-100 (BD INSULIN SYRINGE U/F 1/2UNIT) 31G X 5/16" 0.3 ML MISC Indulin injection QID. Use with lantus and regular insulin Dx. 11.65 01/08/18  Yes Lavera Guise, MD  omega-3 acid ethyl esters (LOVAZA) 1 g capsule Take 1 capsule (1 g total) by mouth 2 (two) times daily. 01/24/19  Yes Scarboro, Audie Clear, NP  Oxcarbazepine (TRILEPTAL) 300 MG tablet Take 300-600 mg by mouth See admin instructions. Take 1 tablet (300MG ) by mouth every morning and 2 tablets (600MG ) by mouth every night   Yes [provider]  oxyCODONE (OXY IR/ROXICODONE) 5 MG immediate release tablet Take 1 tablet (5 mg total) by mouth every 4 (four) hours as needed for moderate pain. 04/23/18  Yes Gouru, Illene Silver, MD  oxyCODONE (OXYCONTIN) 20 mg 12 hr tablet Take 20 mg by mouth every 12 (twelve) hours.  06/09/18  Yes  [provider]  pantoprazole (PROTONIX) 40 MG tablet TAKE 1 TABLET BY MOUTH A DAY FOR REFLUX 09/07/18  Yes Scarboro, Audie Clear, NP  polyethylene glycol (MIRALAX / GLYCOLAX) packet Take 17 g by mouth daily.    Yes [provider]  pregabalin (LYRICA) 200 MG capsule Take 200-400 mg by mouth See admin  instructions. 400 mg in the morning, 200 mg in the evening   Yes [provider]  tamsulosin (FLOMAX) 0.4 MG CAPS capsule Take 1 capsule (0.4 mg total) by mouth daily after supper. 09/07/18  Yes Scarboro, Audie Clear, NP  Exenatide ER (BYDUREON) 2 MG PEN Inject 2 mg into the skin once a week. 11/26/18   Kendell Bane, NP  hydrochlorothiazide (HYDRODIURIL) 12.5 MG tablet Take 1 tablet (12.5 mg total) by mouth daily. 01/31/19   Ronnell Freshwater, NP  lidocaine (LIDODERM) 5 % Place 1 patch onto the skin daily as needed (pain). Remove & Discard patch within 12 hours or as directed by MD    [provider]  NARCAN 4 MG/0.1ML LIQD Take 4 mg by mouth as needed (for opioid overdose).     [provider]  nitroGLYCERIN (NITROSTAT) 0.4 MG SL tablet Place 1 tablet (0.4 mg total) under the tongue every 5 (five) minutes x 3 doses as needed for chest pain. Patient not taking: Reported on 01/31/2019 12/21/14   Dixie Dials, MD      PHYSICAL EXAMINATION:   VITAL SIGNS: Blood pressure 109/66, pulse (!) 59, temperature (!) 97 F (36.1 C), resp. rate 12, SpO2 98 %.  GENERAL:  68 y.o.-year-old obese patient lying in the bed with no acute distress.  EYES: Pupils equal, round, reactive to light and accommodation. No scleral icterus. Extraocular muscles intact.  HEENT: Head atraumatic, normocephalic. Oropharynx and nasopharynx clear.  NECK:  Supple, no jugular venous distention. No thyroid enlargement, no tenderness.  LUNGS: Normal breath sounds bilaterally, no wheezing, rales,rhonchi or crepitation. No use of accessory muscles of respiration.  CARDIOVASCULAR: S1, S2 normal. No murmurs,  rubs, or gallops.  ABDOMEN: Soft, nontender, nondistended. Bowel sounds present. No organomegaly or mass.  EXTREMITIES: No pedal edema, cyanosis, or clubbing.  Left leg and ankle is in postsurgical cast. NEUROLOGIC: Cranial nerves II through XII are intact. Muscle strength 4/5 in all extremities. Sensation intact. Gait not checked.  PSYCHIATRIC: The patient is alert and oriented x 3.  SKIN: No obvious rash, lesion, or ulcer.   LABORATORY PANEL:   CBC Recent Labs  Lab 01/28/19 1000 01/31/19 1038 01/31/19 1434  WBC 4.9 5.2 9.5  HGB 12.3* 12.8* 12.1*  HCT 35.9* 38.4* 36.2*  PLT 292 250 380  MCV 78* 79.3* 79.4*  MCH 26.7 26.4 26.5  MCHC 34.3 33.3 33.4  RDW 16.8* 16.4* 16.8*  LYMPHSABS 1.9 1.7  --   MONOABS  --  0.4  --   EOSABS 0.2 0.2  --   BASOSABS 0.1 0.1  --    ------------------------------------------------------------------------------------------------------------------  Chemistries  Recent Labs  Lab 01/28/19 1000 01/31/19 1038  NA 142 139  K 4.0 3.6  CL 101 97*  CO2 26 27  GLUCOSE 114* 221*  BUN 25 34*  CREATININE 1.03 1.14  CALCIUM 9.3 9.6  AST 16  --   ALT 10  --   ALKPHOS 77  --   BILITOT <0.2  --    ------------------------------------------------------------------------------------------------------------------ estimated creatinine clearance is 84.8 mL/min (by C-G formula based on SCr of 1.14 mg/dL). ------------------------------------------------------------------------------------------------------------------ No results for input(s): TSH, T4TOTAL, T3FREE, THYROIDAB in the last 72 hours.  Invalid input(s): FREET3   Coagulation profile Recent Labs  Lab 01/31/19 1038  INR 0.9   ------------------------------------------------------------------------------------------------------------------- No results for input(s): DDIMER in the last 72  hours. -------------------------------------------------------------------------------------------------------------------  Cardiac Enzymes No results for input(s): CKMB, TROPONINI, MYOGLOBIN in the last 168 hours.  Invalid input(s): CK ------------------------------------------------------------------------------------------------------------------  Invalid input(s): POCBNP  ---------------------------------------------------------------------------------------------------------------  Urinalysis    Component Value Date/Time   COLORURINE YELLOW (A) 10/05/2018 2329   APPEARANCEUR Cloudy (A) 01/28/2019 0959   LABSPEC 1.012 10/05/2018 2329   LABSPEC 1.012 02/06/2014 2120   PHURINE 5.0 10/05/2018 2329   GLUCOSEU Negative 01/28/2019 0959   GLUCOSEU Negative 02/06/2014 2120   HGBUR NEGATIVE 10/05/2018 2329   BILIRUBINUR Negative 01/28/2019 0959   BILIRUBINUR Negative 02/06/2014 2120   KETONESUR NEGATIVE 10/05/2018 2329   PROTEINUR Negative 01/28/2019 0959   PROTEINUR NEGATIVE 10/05/2018 2329   NITRITE Negative 01/28/2019 0959   NITRITE NEGATIVE 10/05/2018 2329   LEUKOCYTESUR 2+ (A) 01/28/2019 0959   LEUKOCYTESUR TRACE (A) 10/05/2018 2329   LEUKOCYTESUR Negative 02/06/2014 2120     RADIOLOGY: Dg Ankle Left Port  Result Date: 01/31/2019 CLINICAL DATA:  Status post left ankle fixation. EXAM: PORTABLE LEFT ANKLE - 2 VIEW COMPARISON:  January 03, 2019 FINDINGS: Post sideplate and screw fixation of the left lateral malleolar fracture, and 2 cancellous screw fixation of the medial malleolar fracture. Minimal improvement in alignment. Overlying soft tissue swelling. IMPRESSION: Post ORIF of medial and lateral malleolar fractures. Electronically Signed   By: Fidela Salisbury M.D.   On: 01/31/2019 16:11    EKG: Orders placed or performed during the hospital encounter of 01/31/19  . EKG 12-Lead  . EKG 12-Lead    IMPRESSION AND PLAN:  *Diabetes mellitus type 2 Continue Lantus at  home dose and keep on sliding scale coverage.  *Coronary artery disease status post stent Continue home medications. Holding blood pressure medication as his blood pressure is running lower normal side after surgery today.  *Chronic diastolic congestive heart failure Currently no exacerbation symptoms. Hold antihypertensive and ACE inhibitor's.  *Hypertension Currently running high poor to low normal blood pressure postsurgically. I have stopped his amlodipine, benazepril, Lasix-May need to resume tomorrow if blood pressure is stable.  *Neuropathy Continue Lyrica.  *Sleep apnea Patient uses CPAP at night.  Continue the same here.  *Ankle fracture status post surgery Manage per primary team.  All the records are reviewed and case discussed with ED provider. Management plans discussed with the patient, family and they are in agreement.  CODE STATUS: Full code. Code Status History    Date Active Date Inactive Code Status Order ID Comments User Context   04/18/2018 1725 04/23/2018 2207 Full Code [id number]  Henreitta Leber, MD Inpatient   03/01/2018 2323 03/13/2018 2210 Full Code [id number]  Lance Coon, MD Inpatient   02/18/2016 1628 02/21/2016 1556 Full Code [id number]  Theodoro Grist, MD ED   10/26/2015 2111 11/03/2015 1616 Full Code [id number]  Vaughan Basta, MD Inpatient   10/19/2015 2200 10/21/2015 2351 Full Code [id number]  Gladstone Lighter, MD Inpatient   12/19/2014 2131 12/21/2014 1710 Full Code [id number]  Charolette Forward, MD Inpatient   12/19/2014 2123 12/19/2014 2131 Full Code [id number]  Charolette Forward, MD Inpatient   12/19/2014 1238 12/19/2014 1718 Full Code [id number]  Dionisio Stacy, MD Inpatient   12/18/2014 1836 12/19/2014 1238 Full Code [id number]  Henreitta Leber, MD Inpatient   Advance Care Planning Activity    Advance Directive Documentation     Most Recent Value  Type of Advance Directive  Healthcare Power of Attorney, Living will  Pre-existing out of facility DNR order  (yellow form or pink MOST form)  -  "MOST" Form in Place?  -       TOTAL TIME TAKING CARE OF THIS PATIENT: 50 minutes.    [employer]  Anselm Jungling M.D on 01/31/2019   Between 7am to 6pm - Pager - [phone]  After 6pm go to [email or web address] - password [email or web address] [facility]  Office  [phone]  CC: Primary care physician; Lavera Guise, MD   Note: This dictation was prepared with Dragon dictation along with smaller phrase technology. Any transcriptional errors that result from this process are unintentional.

## 2019-01-31 NOTE — Progress Notes (Addendum)
Pt is refusing to wear our CPAP. Stated he can't wear ours, the mask does not work for him. Pt stated his daughter is going to bring his from home. Pt is also refusing to do the Incentive Spirometer at this time. Pt is aware that Respiratory is available if he should change his mind.

## 2019-01-31 NOTE — Progress Notes (Signed)
Dr Kayleen Memos and Dr Dennard Nip aware of pt stating has CHF. Dr Kayleen Memos aware b/p 110/68 after 100 ml NS bolus. No further bolus orders.

## 2019-01-31 NOTE — NC FL2 (Signed)
[place] LEVEL OF CARE SCREENING TOOL     IDENTIFICATION  Patient Name: Victor Wise Birthdate: 1951/01/11 Sex: male Admission Date (Current Location): 01/31/2019  [place] and [place] Number:  Engineering geologist and Address:  [facility], [address], [place], [place] [address]      Provider Number: [id number]  Attending Physician Name and Address:  Thornton Park, MD  Relative Name and Phone Number:       Current Level of Care: Hospital Recommended Level of Care: [place] Prior Approval Number:    Date Approved/Denied:   PASRR Number: [id number]  Discharge Plan: SNF    Current Diagnoses: Patient Active Problem List   Diagnosis Date Noted  . Bilateral leg edema 08/20/2018  . Diabetic ulcer of left foot ([place]) 04/18/2018  . Swelling of limb 04/06/2018  . Atherosclerosis of native arteries of the extremities with ulceration ([place]) 04/06/2018  . Edema of lower extremity 04/04/2018  . Diabetic foot infection ([place]) 03/01/2018  . Spinal cord stimulator status 01/12/2018  . Essential hypertension 09/05/2017  . OSA on CPAP 09/05/2017  . Uncontrolled type 2 diabetes mellitus with hyperglycemia ([place]) 08/02/2017  . Peripheral vascular disease ([place]) 08/02/2017  . Mixed hyperlipidemia 08/02/2017  . Dysuria 08/02/2017  . Obesity, Class III, BMI 40-49.9 (morbid obesity) ([place]) 08/10/2016  . Hemiparesis affecting left side as late effect of stroke ([place]) 05/09/2016  . Symptomatic bradycardia 03/21/2016  . Falls frequently 02/26/2016  . Acute renal failure (ARF) ([place]) 02/18/2016  . Hypotension 02/18/2016  . Sick sinus syndrome ([place]) 10/28/2015  . Syncopal episodes 10/26/2015  . Syncope 10/26/2015  . Adjustment disorder with mixed anxiety and depressed mood 10/20/2015  . Dysthymia 10/20/2015  . CVA (cerebral infarction) 10/19/2015  . Urinary retention 03/25/2015  . 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 Coronary artery  disease due to lipid rich plaque 12/29/2014  . Acute MI, anterolateral wall, subsequent episode of care ([place]) 12/19/2014  . SIRS (systemic inflammatory response syndrome) ([place]) 12/18/2014  . Systemic inflammatory response syndrome (sirs) of non-infectious origin without acute organ dysfunction ([place]) 12/18/2014  . Encounter for other preprocedural examination 11/10/2014  . LVH (left ventricular hypertrophy) due to hypertensive disease, with heart failure ([place]) 10/02/2014  . Benign essential hypertension 09/26/2014  . Chronic diastolic CHF (congestive heart failure), NYHA class 2 ([place]) 06/03/2014  . Pain syndrome, chronic 03/02/2014  . Diabetes ([place]) 11/08/2013  . Chronic radicular low back pain 08/21/2013  . Right shoulder pain 04/03/2013  . Urinary tract infection 03/16/2012  . Balanoposthitis 02/13/2012  . History of urinary stone 02/13/2012  . Hypertrophy of prostate with urinary obstruction and other lower urinary tract symptoms (LUTS) 02/13/2012  . 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 Redundant prepuce and phimosis 02/13/2012  . Urge incontinence 02/13/2012  . Right foot pain 01/18/2012  . Chronic, continuous use of opioids 08/22/2011    Orientation RESPIRATION BLADDER Height & Weight     Self, Situation, Time, Place  O2(2 Liters Oxygen.) Continent Weight:   Height:     BEHAVIORAL SYMPTOMS/MOOD NEUROLOGICAL BOWEL NUTRITION STATUS      Continent Diet(Diet: NPO for surgery to be advanced.)  AMBULATORY STATUS COMMUNICATION OF NEEDS Skin   Extensive Assist Verbally Surgical wounds(Incision: Left Ankle)                       Personal Care Assistance Level of Assistance  Bathing, Feeding, Dressing Bathing Assistance: Limited assistance Feeding assistance: Independent Dressing Assistance:  Limited assistance     Functional Limitations Info  Sight, Hearing, Speech Sight Info: Adequate Hearing Info: Adequate Speech Info: Adequate    SPECIAL CARE FACTORS FREQUENCY  PT (By  licensed PT), OT (By licensed OT)     PT Frequency: 5 OT Frequency: 5            Contractures      Additional Factors Info  Code Status, Allergies Code Status Info: Full Code. Allergies Info: Amoxicillin, Other, Penicillins, Hydralazine, Cephalexin, Clindamycin, Crestor Rosuvastatin, Calcium, Metoprolol, Red Dye, Statins, Sulfa,Antibiotics, Yellow Dyes (Non-tartrazine), Aspirin,Tape           Current Medications (01/31/2019):  This is the current hospital active medication list Current Facility-Administered Medications  Medication Dose Route Frequency Provider Last Rate Last Dose  . 0.9 %  sodium chloride infusion   Intravenous Continuous Martha Clan, MD 50 mL/hr at 01/31/19 1148    . Chlorhexidine Gluconate Cloth 2 % PADS 6 each  6 each Topical Once Thornton Park, MD       And  . Chlorhexidine Gluconate Cloth 2 % PADS 6 each  6 each Topical Once Thornton Park, MD         Discharge Medications: Please see discharge summary for a list of discharge medications.  Relevant Imaging Results:  Relevant Lab Results:   Additional Information SSN: [id number]  Dawnita Molner, Veronia Beets, [place]

## 2019-01-31 NOTE — Transfer of Care (Signed)
Immediate Anesthesia Transfer of Care Note  Patient: Victor Wise  Procedure(s) Performed: open reduction internal fixation left bimalleolar ankle fracture (Left Ankle)  Patient Location: PACU  Anesthesia Type:General  Level of Consciousness: drowsy  Airway & Oxygen Therapy: Patient Spontanous Breathing and Patient connected to face mask oxygen  Post-op Assessment: Report given to RN and Post -op Vital signs reviewed and stable  Post vital signs: stable  Last Vitals:  Vitals Value Taken Time  BP 97/65 01/31/19 1515  Temp    Pulse 60 01/31/19 1518  Resp 17 01/31/19 1518  SpO2 100 % 01/31/19 1518  Vitals shown include unvalidated device data.  Last Pain:  Vitals:   01/31/19 1514  TempSrc:   PainSc: (P) Asleep         Complications: No apparent anesthesia complications

## 2019-02-01 ENCOUNTER — Encounter: Payer: Self-pay | Admitting: Orthopedic Surgery

## 2019-02-01 LAB — GLUCOSE, CAPILLARY
Glucose-Capillary: 143 mg/dL — ABNORMAL HIGH (ref 70–99)
Glucose-Capillary: 285 mg/dL — ABNORMAL HIGH (ref 70–99)
Glucose-Capillary: 294 mg/dL — ABNORMAL HIGH (ref 70–99)
Glucose-Capillary: 259 mg/dL — ABNORMAL HIGH (ref 70–99)

## 2019-02-01 LAB — BASIC METABOLIC PANEL
Anion gap: 9 (ref 5–15)
BUN: 35 mg/dL — ABNORMAL HIGH (ref 8–23)
CO2: 27 mmol/L (ref 22–32)
Calcium: 8.1 mg/dL — ABNORMAL LOW (ref 8.9–10.3)
Chloride: 104 mmol/L (ref 98–111)
Creatinine, Ser: 1.39 mg/dL — ABNORMAL HIGH (ref 0.61–1.24)
GFR calc Af Amer: >60 mL/min (ref >60)
GFR calc non Af Amer: 52 mL/min — ABNORMAL LOW (ref >60)
Glucose, Bld: 155 mg/dL — ABNORMAL HIGH (ref 70–99)
Potassium: 3.5 mmol/L (ref 3.5–5.1)
Sodium: 140 mmol/L (ref 135–145)

## 2019-02-01 LAB — CBC
HCT: 30.1 % — ABNORMAL LOW (ref 39.0–52.0)
Hemoglobin: 9.7 g/dL — ABNORMAL LOW (ref 13.0–17.0)
MCH: 26.4 pg (ref 26.0–34.0)
MCHC: 32.2 g/dL (ref 30.0–36.0)
MCV: 82 fL (ref 80.0–100.0)
Platelets: 207 10*3/uL (ref 150–400)
RBC: 3.67 MIL/uL — ABNORMAL LOW (ref 4.22–5.81)
RDW: 17 % — ABNORMAL HIGH (ref 11.5–15.5)
WBC: 8.1 10*3/uL (ref 4.0–10.5)
nRBC: 0 % (ref 0.0–0.2)

## 2019-02-01 MED ORDER — ENOXAPARIN SODIUM 40 MG/0.4ML ~~LOC~~ SOLN
40.0000 mg | SUBCUTANEOUS | Status: DC
  Administered 2019-02-01 – 2019-02-06 (×6): 40 mg via SUBCUTANEOUS
  Filled 2019-02-01 (×6): qty 0.4

## 2019-02-01 NOTE — Progress Notes (Signed)
[facility] [place] NAME: Victor Wise    MR#:  [id number]  DATE OF BIRTH:  08/08/1950  SUBJECTIVE:   Patient is status post repair of a bimalleolar fracture of the left ankle postop day #1 today.  Still complaining of significant pain in the left foot/ankle.  Patient is very hesitant to go to a skilled nursing facility.  REVIEW OF SYSTEMS:    Review of Systems  Constitutional: Negative for chills and fever.  HENT: Negative for congestion and tinnitus.   Eyes: Negative for blurred vision and double vision.  Respiratory: Negative for cough, shortness of breath and wheezing.   Cardiovascular: Negative for chest pain, orthopnea and PND.  Gastrointestinal: Negative for abdominal pain, diarrhea, nausea and vomiting.  Genitourinary: Negative for dysuria and hematuria.  Musculoskeletal: Positive for falls and joint pain (Left Ankle).  Neurological: Negative for dizziness, sensory change and focal weakness.  All other systems reviewed and are negative.   Nutrition: Regular Tolerating Diet: Yes Tolerating PT: Eval noted.    DRUG ALLERGIES:   Allergies  Allergen Reactions  . Amoxicillin Other (See Comments)    Has never taken amoxicillin -ENT told him not to take (? Had skin test) Has patient had a PCN reaction causing immediate rash, facial/tongue/throat swelling, SOB or lightheadedness with hypotension: No Has patient had a PCN reaction causing severe rash involving mucus membranes or skin necrosis: No Has patient had a PCN reaction that required hospitalization No Has patient had a PCN reaction occurring within the last 10 years: No If above answers are "NO", then may proceed w/ Cephalo  . Other Anaphylaxis, Itching and Other (See Comments)    Pt states that he is allergic to Endopa.  Reaction:  Anaphylaxis  Pt states that he is allergic to Metabisulfites Reaction:  Itching   . Penicillins Other (See Comments)    Arm turned "blue" at  injection site (no treatment needed) Has patient had a PCN reaction causing immediate rash, facial/tongue/throat swelling, SOB or lightheadedness with hypotension: No Has patient had a PCN reaction causing severe rash involving mucus membranes or skin necrosis: No Has patient had a PCN reaction that required hospitalization No Has patient had a PCN reaction occurring within the last 10 years: No If all of the above answers are "NO", then may proceed with Cephalosporin use.  [name] Hydralazine Other (See Comments)    Reaction:  Cramping of extremities Pt reports tolerating low dose 12.5mg  but no higher.  . Cephalexin Itching  . Clindamycin Itching  . Crestor [Rosuvastatin Calcium] Other (See Comments)    Reaction:  Pt is unable to move arms/legs   . Metoprolol Nausea And Vomiting  . Red Dye Itching  . Statins Other (See Comments)    Reaction:  Pt is unable to move arms/legs  . Sulfa Antibiotics Itching    Other reaction(s): Unknown  . Yellow Dyes (Non-Tartrazine) Itching  . Aspirin Itching and Other (See Comments)    Pt states that he is able to use in lower doses.    . Tape Rash    Please use "paper" tape only. Please use "paper" tape only.    VITALS:  Blood pressure 125/65, pulse 60, temperature 98 F (36.7 C), temperature source Oral, resp. rate 16, SpO2 93 %.  PHYSICAL EXAMINATION:   Physical Exam  GENERAL:  68 y.o.-year-old patient lying in bed complaining of pain in the left foot/ankle.  EYES: Pupils equal, round, reactive to light and  accommodation. No scleral icterus. Extraocular muscles intact.  HEENT: Head atraumatic, normocephalic. Oropharynx and nasopharynx clear.  NECK:  Supple, no jugular venous distention. No thyroid enlargement, no tenderness.  LUNGS: Normal breath sounds bilaterally, no wheezing, rales, rhonchi. No use of accessory muscles of respiration.  CARDIOVASCULAR: S1, S2 normal. No murmurs, rubs, or gallops.  ABDOMEN: Soft, nontender, nondistended. Bowel  sounds present. No organomegaly or mass.  EXTREMITIES: No cyanosis, clubbing or edema b/l.  Left foot/ankle in cast/boot.   NEUROLOGIC: Cranial nerves II through XII are intact. No focal Motor or sensory deficits b/l.   PSYCHIATRIC: The patient is alert and oriented x 3.  SKIN: No obvious rash, lesion, or ulcer.    LABORATORY PANEL:   CBC Recent Labs  Lab 02/01/19 0436  WBC 8.1  HGB 9.7*  HCT 30.1*  PLT 207   ------------------------------------------------------------------------------------------------------------------  Chemistries  Recent Labs  Lab 01/28/19 1000  02/01/19 0436  NA 142   < > 140  K 4.0   < > 3.5  CL 101   < > 104  CO2 26   < > 27  GLUCOSE 114*   < > 155*  BUN 25   < > 35*  CREATININE 1.03   < > 1.39*  CALCIUM 9.3   < > 8.1*  AST 16  --   --   ALT 10  --   --   ALKPHOS 77  --   --   BILITOT <0.2  --   --    < > = values in this interval not displayed.   ------------------------------------------------------------------------------------------------------------------  Cardiac Enzymes No results for input(s): TROPONINI in the last 168 hours. ------------------------------------------------------------------------------------------------------------------  RADIOLOGY:  Dg Ankle Left Port  Result Date: 01/31/2019 CLINICAL DATA:  Status post left ankle fixation. EXAM: PORTABLE LEFT ANKLE - 2 VIEW COMPARISON:  January 03, 2019 FINDINGS: Post sideplate and screw fixation of the left lateral malleolar fracture, and 2 cancellous screw fixation of the medial malleolar fracture. Minimal improvement in alignment. Overlying soft tissue swelling. IMPRESSION: Post ORIF of medial and lateral malleolar fractures. Electronically Signed   By: Fidela Salisbury M.D.   On: 01/31/2019 16:11     ASSESSMENT AND PLAN:   68 year old male with past medical history of diabetes, GERD, hypertension, previous MI, morbid obesity, obstructive sleep apnea, history of previous  CVA, BPH, chronic back pain who presented to the hospital after a fall and noted to have a left ankle fracture.  1.  Status post fall left ankle fracture-patient is status post repair of a bimalleolar left ankle fracture.  Postop day #1 today. -Patient having significant pain in the left foot and ankle still. -Patient is on chronic pain meds at home.  Continue OxyContin, oxycodone as needed along with Dilaudid as needed.  2.  DM type II with neuropathy-continue Lantus, sliding scale insulin.  Follow blood sugars. -Continue carb controlled diet.  3.  History of previous CVA-continue Plavix, fenofibrate, Zetia, Lopid  4.  History of depression-continue Cymbalta.  5.  GERD-continue Protonix.  6.  Peripheral neuropathy-continue Lyrica.  7.  BPH-continue Flomax.  8. Chronic Pain - cont. Oxycontin.   All the records are reviewed and case discussed with Care Management/Social Worker. Management plans discussed with the patient, family and they are in agreement.  CODE STATUS: Full code  DVT Prophylaxis: Lovenox  TOTAL TIME TAKING CARE OF THIS PATIENT: 30 minutes.   POSSIBLE D/C IN 2-3 DAYS, DEPENDING ON CLINICAL CONDITION.   Henreitta Leber M.D on  02/01/2019 at 2:31 PM  Between 7am to 6pm - Pager - [phone]  After 6pm go to [email or web address] - password [email or web address] [facility]  Office  [phone]  CC: Primary care physician; Lavera Guise, MD

## 2019-02-01 NOTE — Progress Notes (Signed)
Subjective:  POD #1 s/p ORIF left ankle.   Patient reports left ankle pain as marked.  Patient is on chronic pain medications including OxyContin at home.    Objective:   VITALS:   Vitals:   02/01/19 0020 02/01/19 0418 02/01/19 0748 02/01/19 1241  BP: 114/61 107/66 101/68 125/65  Pulse: (!) 59 (!) 59 (!) 59 60  Resp: 17 16  16   Temp: 98.9 F (37.2 C) 97.8 F (36.6 C) 97.8 F (36.6 C) 98 F (36.7 C)  TempSrc: Oral Oral Oral Oral  SpO2: 98% 99% 100% 93%    PHYSICAL EXAM: Left lower extremity: I open the patient's dressing completely to evaluate his incisions which are clean dry and intact.  There is no active drainage from his incisions.  His leg compartments are soft and compressible.  Patient has intact sensation light touch.  His foot is well-perfused.  His dressing and splint were rewrapped with an Ace wrap without significant tension.   LABS  Results for orders placed or performed during the hospital encounter of 01/31/19 (from the past 24 hour(s))  Type and screen [place]     Status: None   Collection Time: 01/31/19  2:34 PM  Result Value Ref Range   ABO/RH(D) O POS    Antibody Screen NEG    Sample Expiration      02/03/2019,2359 Performed at [facility], [address] [place], [place] [address]   CBC     Status: Abnormal   Collection Time: 01/31/19  2:34 PM  Result Value Ref Range   WBC 9.5 4.0 - 10.5 K/uL   RBC 4.56 4.22 - 5.81 MIL/uL   Hemoglobin 12.1 (L) 13.0 - 17.0 g/dL   HCT 36.2 (L) 39.0 - 52.0 %   MCV 79.4 (L) 80.0 - 100.0 fL   MCH 26.5 26.0 - 34.0 pg   MCHC 33.4 30.0 - 36.0 g/dL   RDW 16.8 (H) 11.5 - 15.5 %   Platelets 380 150 - 400 K/uL   nRBC 0.0 0.0 - 0.2 %  Glucose, capillary     Status: Abnormal   Collection Time: 01/31/19  3:31 PM  Result Value Ref Range   Glucose-Capillary 164 (H) 70 - 99 mg/dL  Glucose, capillary     Status: Abnormal   Collection Time: 01/31/19 10:22 PM  Result Value Ref Range   Glucose-Capillary 200 (H) 70 - 99 mg/dL   Comment 1 Notify RN   CBC     Status: Abnormal   Collection Time: 02/01/19  4:36 AM  Result Value Ref Range   WBC 8.1 4.0 - 10.5 K/uL   RBC 3.67 (L) 4.22 - 5.81 MIL/uL   Hemoglobin 9.7 (L) 13.0 - 17.0 g/dL   HCT 30.1 (L) 39.0 - 52.0 %   MCV 82.0 80.0 - 100.0 fL   MCH 26.4 26.0 - 34.0 pg   MCHC 32.2 30.0 - 36.0 g/dL   RDW 17.0 (H) 11.5 - 15.5 %   Platelets 207 150 - 400 K/uL   nRBC 0.0 0.0 - 0.2 %  Basic metabolic panel     Status: Abnormal   Collection Time: 02/01/19  4:36 AM  Result Value Ref Range   Sodium 140 135 - 145 mmol/L   Potassium 3.5 3.5 - 5.1 mmol/L   Chloride 104 98 - 111 mmol/L   CO2 27 22 - 32 mmol/L   Glucose, Bld 155 (H) 70 - 99 mg/dL   BUN 35 (H) 8 - 23  mg/dL   Creatinine, Ser 1.39 (H) 0.61 - 1.24 mg/dL   Calcium 8.1 (L) 8.9 - 10.3 mg/dL   GFR calc non Af Amer 52 (L) >60 mL/min   GFR calc Af Amer >60 >60 mL/min   Anion gap 9 5 - 15  Glucose, capillary     Status: Abnormal   Collection Time: 02/01/19  7:49 AM  Result Value Ref Range   Glucose-Capillary 143 (H) 70 - 99 mg/dL  Glucose, capillary     Status: Abnormal   Collection Time: 02/01/19 11:46 AM  Result Value Ref Range   Glucose-Capillary 285 (H) 70 - 99 mg/dL   Comment 1 Notify RN    Comment 2 Document in Chart     Dg Ankle Left Port  Result Date: 01/31/2019 CLINICAL DATA:  Status post left ankle fixation. EXAM: PORTABLE LEFT ANKLE - 2 VIEW COMPARISON:  January 03, 2019 FINDINGS: Post sideplate and screw fixation of the left lateral malleolar fracture, and 2 cancellous screw fixation of the medial malleolar fracture. Minimal improvement in alignment. Overlying soft tissue swelling. IMPRESSION: Post ORIF of medial and lateral malleolar fractures. Electronically Signed   By: Fidela Salisbury M.D.   On: 01/31/2019 16:11    Assessment/Plan: 1 Day Post-Op   Active Problems:   Bimalleolar fracture of left ankle  Patient with postoperative pain.  Patient  will continue with physical therapy.  He has agreed to go to skilled nursing facility, [facility], upon discharge.  This is medically necessary as the patient has been unable to care for himself adequately at home and is a significant fall risk.  Patient will continue to elevate his left lower extremity.  Apply ice to the left ankle.  I spoke with the patient's nurse, [name], to encourage her to use up to 15 mg of oxycodone every 4 hours as needed for pain and if this is unable to control his pain adequately I encouraged her to use the IV Dilaudid as needed which I have ordered.    Thornton Park , MD 02/01/2019, 1:29 PM

## 2019-02-01 NOTE — Anesthesia Preprocedure Evaluation (Signed)
Anesthesia Evaluation  Patient identified by MRN, date of birth, ID band Patient awake    Reviewed: Allergy & Precautions, NPO status , Patient's Chart, lab work & pertinent test results  History of Anesthesia Complications Negative for: history of anesthetic complications  Airway Mallampati: III  TM Distance: >3 FB Neck ROM: Full    Dental  (+) Poor Dentition, Missing   Pulmonary neg shortness of breath, sleep apnea and Continuous Positive Airway Pressure Ventilation , neg COPD, neg recent URI, former smoker,    breath sounds clear to auscultation- rhonchi (-) wheezing      Cardiovascular hypertension, (-) angina+ CAD, + Past MI, + Cardiac Stents (last stent ~3 yrs ago), + Peripheral Vascular Disease and +CHF  (-) CABG (-) dysrhythmias + pacemaker (-) Valvular Problems/Murmurs Rhythm:Regular Rate:Normal - Systolic murmurs and - Diastolic murmurs    Neuro/Psych neg Seizures PSYCHIATRIC DISORDERS Depression CVA, No Residual Symptoms    GI/Hepatic Neg liver ROS, GERD  ,  Endo/Other  diabetes, Insulin Dependent  Renal/GU negative Renal ROS     Musculoskeletal negative musculoskeletal ROS (+)   Abdominal (+) + obese,   Peds  Hematology negative hematology ROS (+)   Anesthesia Other Findings Past Medical History: No date: Allergy No date: Basal cell carcinoma     Comment:  forehead No date: BPH (benign prostatic hyperplasia) No date: Chronic back pain No date: Depression No date: Diabetes (HCC) No date: GERD (gastroesophageal reflux disease) No date: Heart attack ([place]) No date: Hypertension No date: Morbid obesity ([place]) No date: Obstructive sleep apnea No date: Osteomyelitis of foot (HCC) No date: Status post insertion of spinal cord stimulator No date: Stroke [facility]) No date: UTI (lower urinary tract infection)  We had his pacemaker interrogated prior to starting the case and the patient is not pacemaker  dependent, but we will place a magnet over it during the procedure to put it into asynchronous mode with a rate of 85.  Last ECHO showed an EF of 45%.  Reproductive/Obstetrics                            Anesthesia Physical  Anesthesia Plan  ASA: III  Anesthesia Plan: General   Post-op Pain Management:    Induction: Intravenous  PONV Risk Score and Plan: 2 and Ondansetron, Dexamethasone and Treatment may vary due to age or medical condition  Airway Management Planned: Oral ETT  Additional Equipment:   Intra-op Plan:   Post-operative Plan: Extubation in OR  Informed Consent: I have reviewed the patients History and Physical, chart, labs and discussed the procedure including the risks, benefits and alternatives for the proposed anesthesia with the patient or authorized representative who has indicated his/her understanding and acceptance.     Dental advisory given  Plan Discussed with: CRNA and Anesthesiologist  Anesthesia Plan Comments:         Anesthesia Quick Evaluation

## 2019-02-01 NOTE — Anesthesia Postprocedure Evaluation (Signed)
Anesthesia Post Note  Patient:  Victor Wise  Procedure(s) Performed: open reduction internal fixation left bimalleolar ankle fracture (Left Ankle)  Patient location during evaluation: PACU Anesthesia Type: General Level of consciousness: awake and alert Pain management: pain level controlled Vital Signs Assessment: post-procedure vital signs reviewed and stable Respiratory status: spontaneous breathing, nonlabored ventilation, respiratory function stable and patient connected to nasal cannula oxygen Cardiovascular status: blood pressure returned to baseline and stable Postop Assessment: no apparent nausea or vomiting Anesthetic complications: no     Last Vitals:  Vitals:   02/01/19 0418 02/01/19 0748  BP: 107/66 101/68  Pulse: (!) 59 (!) 59  Resp: 16   Temp: 36.6 C 36.6 C  SpO2: 99% 100%    Last Pain:  Vitals:   02/01/19 0748  TempSrc: Oral  PainSc:                  Raghav Verrilli S

## 2019-02-01 NOTE — Progress Notes (Signed)
Physical Therapy Treatment Patient Details Name: Victor Wise MRN: [id number] DOB: 09-09-1950 Today's Date: 02/01/2019    History of Present Illness Pt is a 68yo male s/p ankle ORIF for non-healing ankle fracture from a recent fall. PMHx includes pacemaker, CAD s/p stent, DM, depression, GERD, HTN, stroke, UTI, CHF, and MI.    PT Comments    Author return to education patient on bed level RLE strengthening to preserve/advance RLE function in basic mobility. Pt remains in high pain levels but tolerates fairly well in general. Force production at Rt hip extension from a flexed position is quite weak, far below force production required to perform a STS transfer with one limb only, the opposite NWB. Upon entry linen noted to be soiled, nursing contacted and assisted with bed mobility for linen changes and improving UE strength for advancing independence in basic mobility. Pt made comfortable at end of session.   Follow Up Recommendations  SNF;Supervision for mobility/OOB     Equipment Recommendations  None recommended by PT    Recommendations for Other Services       Precautions / Restrictions Precautions Precautions: Fall Restrictions LLE Weight Bearing: Non weight bearing    Mobility  Bed Mobility Overal bed mobility: Needs Assistance Bed Mobility: Supine to Sit;Sit to Supine;Rolling Rolling: Mod assist;+2 for physical assistance;+2 for safety/equipment   Supine to sit: Max assist Sit to supine: Max assist   General bed mobility comments: Tolerates sitting independently at EOB well x15 minutes for history collection and discussion of mobility.  Transfers                 General transfer comment: Makes initital attempt, but reports he can tell he is too weak to be successful. Pt desires to STS using WC facing him, BUE on WC arms, but pt encouraged to not attempt 180degree turn in such a weak state, encouraged lateral scoot, which he reports is not able to  perform.  Ambulation/Gait                 Stairs             Wheelchair Mobility    Modified Rankin (Stroke Patients Only)       Balance                                            Cognition Arousal/Alertness: Awake/alert Behavior During Therapy: WFL for tasks assessed/performed Overall Cognitive Status: Within Functional Limits for tasks assessed                                 General Comments: alert and oriented, questionable safety awareness, as pt reports feeling safe to return home without assist despite being unable to participate in any functional mobility or ADL tasks 2/2 pain      Exercises General Exercises - Lower Extremity Ankle Circles/Pumps: AROM;Right;15 reps;Supine Short Arc Quad: AROM;Right;15 reps;Supine Heel Slides: Right;15 reps;Supine;AAROM Hip ABduction/ADduction: AAROM;Right;15 reps;Supine Hip Flexion/Marching: AAROM;Both;10 reps;Supine Mini-Sqauts: Strengthening;Right;15 reps;Supine;Limitations Mini Squats Limitations: manually resisted    General Comments        Pertinent Vitals/Pain Pain Assessment: 0-10 Pain Score: 9  Pain Location: L ankle Pain Descriptors / Indicators: Aching;Grimacing;Guarding;Sharp Pain Intervention(s): Limited activity within patient's tolerance;Monitored during session;Premedicated before session;Repositioned    Home Living  Prior Function            PT Goals (current goals can now be found in the care plan section) Acute Rehab PT Goals Patient Stated Goal: go home and take care of myself PT Goal Formulation: With patient Time For Goal Achievement: 02/15/19 Potential to Achieve Goals: Poor Progress towards PT goals: Progressing toward goals    Frequency    7X/week      PT Plan Current plan remains appropriate    Co-evaluation              AM-PAC PT "6 Clicks" Mobility   Outcome Measure  Help needed turning from  your back to your side while in a flat bed without using bedrails?: Total Help needed moving from lying on your back to sitting on the side of a flat bed without using bedrails?: Total Help needed moving to and from a bed to a chair (including a wheelchair)?: Total Help needed standing up from a chair using your arms (e.g., wheelchair or bedside chair)?: Total Help needed to walk in hospital room?: Total Help needed climbing 3-5 steps with a railing? : Total 6 Click Score: 6    End of Session Equipment Utilized During Treatment: Gait belt Activity Tolerance: Patient tolerated treatment well Patient left: in bed;with call bell/phone within reach;with family/visitor present;with SCD's reapplied Nurse Communication: Mobility status PT Visit Diagnosis: Difficulty in walking, not elsewhere classified (R26.2);History of falling (Z91.81);Dizziness and giddiness (R42)     Time: [id number] PT Time Calculation (min) (ACUTE ONLY): 21 min  Charges:  $Therapeutic Exercise: 8-22 mins             5:07 PM, 02/01/19 Etta Grandchild, PT, DPT Physical Therapist - [facility]  [phone] ([place])    Momo Braun C 02/01/2019, 5:04 PM

## 2019-02-01 NOTE — TOC Initial Note (Addendum)
Transition of Care [facility]) - Initial/Assessment Note    Patient Details  Name: Victor Wise MRN: [id number] Date of Birth: 1950-10-17  Transition of Care [facility]) CM/SW Contact:    Su Hilt, RN Phone Number: 02/01/2019, 9:54 AM  Clinical Narrative:                 Met with the patient in the room to discuss DC plan and offer beds, He flat out refuses to go to rehab, he said he told Dr Nicola Police he was not going to go, he has been in the past and said no he did not want to go with the Covid, I explained to him that every patient is in a private room and away from other patients for at least 14 days, they are quarantined, I explained that they have strict precautions to keep the risk low.  He said no way.  I reviewed the bed offers that were in place and he said out of the opned offered if he did choose it would be [facility], I asked permission to accept the bed offer and he said no, I asked him what equipment he has at home and he has a wheelchair, Rolator, standard walker, rolling walker, Bedside commode, he does not need additional equipment, He stated that he lives alone.  I asked him who would be helping him at home and he stated he would do it himself.  I asked him what he would do if he was not able to move around He stated he would call EMS, I encouraged him to go to rehab to avoid that, he still says no. He is terrified of getting Covid. He is open with [facility] thru [facility] at the moment, I contacted [place] at [facility] and let her know he is here, she verified that he is open with them for PT and SW  He gives permission to speak with his daughter [name], I attempted to call her but was unsuccessful, left a VM for a call back and provided my information Expected Discharge Plan: [place] Barriers to Discharge: Continued Medical Work up   Patient Goals and CMS Choice Patient states their goals for this hospitalization and ongoing recovery are:: Go home       Expected Discharge Plan and Services Expected Discharge Plan: [place]   Discharge Planning Services: CM Consult   Living arrangements for the past 2 months: Single Family Home Expected Discharge Date: 02/01/19               DME Arranged: N/A         HH Arranged: PT, OT, Nurse's Aide HH Agency: [facility] Date HH Agency Contacted: 02/01/19 Time [place]: [phone] Representative spoke with at [place]: [place]  Prior Living Arrangements/Services Living arrangements for the past 2 months: Victor with:: Self Patient language and need for interpreter reviewed:: Yes Do you feel safe going back to the place where you live?: Yes      Need for Family Participation in Patient Care: No (Comment) Care giver support system in place?: Yes (comment) Current home services: DME(Wheel Chair, Rolator, RW, standard walker, BSC) Criminal Activity/Legal Involvement Pertinent to Current Situation/Hospitalization: No - Comment as needed  Activities of Daily Living Home Assistive Devices/Equipment: Walker (specify type), Shower chair without back, Wheelchair, Oxygen ADL Screening (condition at time of admission) Patient's cognitive ability adequate to safely complete daily activities?: Yes Is the patient  deaf or have difficulty hearing?: No Does the patient have difficulty seeing, even when wearing glasses/contacts?: No Does the patient have difficulty concentrating, remembering, or making decisions?: No Patient able to express need for assistance with ADLs?: Yes Does the patient have difficulty dressing or bathing?: No Independently performs ADLs?: Yes (appropriate for developmental age) Does the patient have difficulty walking or climbing stairs?: Yes Weakness of Legs: Left Weakness of Arms/Hands: None  Permission Sought/Granted   Permission granted to share information with : Yes, Verbal Permission Granted              Emotional  Assessment Appearance:: Appears stated age Attitude/Demeanor/Rapport: Engaged Affect (typically observed): Appropriate Orientation: : Oriented to Self, Oriented to Place, Oriented to  Time, Oriented to Situation Alcohol / Substance Use: Not Applicable Psych Involvement: No (comment)  Admission diagnosis:  S82.842G displaced bimalleolar fracture of left lower leg closed fracture with delayed healing Patient Active Problem List   Diagnosis Date Noted  . Bimalleolar fracture of left ankle 01/31/2019  . Bilateral leg edema 08/20/2018  . Diabetic ulcer of left foot ([place]) 04/18/2018  . Swelling of limb 04/06/2018  . Atherosclerosis of native arteries of the extremities with ulceration ([place]) 04/06/2018  . Edema of lower extremity 04/04/2018  . Diabetic foot infection ([place]) 03/01/2018  . Spinal cord stimulator status 01/12/2018  . Essential hypertension 09/05/2017  . OSA on CPAP 09/05/2017  . Uncontrolled type 2 diabetes mellitus with hyperglycemia ([place]) 08/02/2017  . Peripheral vascular disease ([place]) 08/02/2017  . Mixed hyperlipidemia 08/02/2017  . Dysuria 08/02/2017  . Obesity, Class III, BMI 40-49.9 (morbid obesity) ([place]) 08/10/2016  . Hemiparesis affecting left side as late effect of stroke ([place]) 05/09/2016  . Symptomatic bradycardia 03/21/2016  . Falls frequently 02/26/2016  . Acute renal failure (ARF) ([place]) 02/18/2016  . Hypotension 02/18/2016  . Sick sinus syndrome ([place]) 10/28/2015  . Syncopal episodes 10/26/2015  . Syncope 10/26/2015  . Adjustment disorder with mixed anxiety and depressed mood 10/20/2015  . Dysthymia 10/20/2015  . CVA (cerebral infarction) 10/19/2015  . Urinary retention 03/25/2015  . 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 Coronary artery disease due to lipid rich plaque 12/29/2014  . Acute MI, anterolateral wall, subsequent episode of care ([place]) 12/19/2014  . SIRS (systemic inflammatory response syndrome) ([place]) 12/18/2014  . Systemic inflammatory response  syndrome (sirs) of non-infectious origin without acute organ dysfunction ([place]) 12/18/2014  . Encounter for other preprocedural examination 11/10/2014  . LVH (left ventricular hypertrophy) due to hypertensive disease, with heart failure ([place]) 10/02/2014  . Benign essential hypertension 09/26/2014  . Chronic diastolic CHF (congestive heart failure), NYHA class 2 ([place]) 06/03/2014  . Pain syndrome, chronic 03/02/2014  . Diabetes ([place]) 11/08/2013  . Chronic radicular low back pain 08/21/2013  . Right shoulder pain 04/03/2013  . Urinary tract infection 03/16/2012  . Balanoposthitis 02/13/2012  . History of urinary stone 02/13/2012  . Hypertrophy of prostate with urinary obstruction and other lower urinary tract symptoms (LUTS) 02/13/2012  . 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 Redundant prepuce and phimosis 02/13/2012  . Urge incontinence 02/13/2012  . Right foot pain 01/18/2012  . Chronic, continuous use of opioids 08/22/2011   PCP:  Lavera Guise, MD Pharmacy:   [facility] [address], [place] [address] Phone: [phone] Fax: [place], [place] [name] [address] Phone: [phone] Fax: [phone]  [facility] [place], [place] [address] Phone: [phone] Fax: [facility] [place], [place] [address] Phone: [phone] Fax: [phone]  [facility] [address] [place] - [place]) [place] [address] Phone: [phone] Fax: [phone]     Social Determinants of Health (SDOH) Interventions    Readmission Risk Interventions No flowsheet data found.

## 2019-02-01 NOTE — Evaluation (Signed)
Physical Therapy Evaluation Patient Details Name: Victor Wise MRN: [id number] DOB: [birth year] Today's Date: 02/01/2019   History of Present Illness  Pt is a 68yo male s/p ankle ORIF for non-healing ankle fracture from a recent fall. PMHx includes pacemaker, CAD s/p stent, DM, depression, GERD, HTN, stroke, UTI, CHF, and MI.  Clinical Impression  Pt admitted with above diagnosis. Pt currently with functional limitations due to the deficits listed below (see "PT Problem List"). Upon entry, pt in bed, awake and conditionally agreeable to participate as he continues to have 9-10/10 pain. The pt is alert and oriented x4, pleasant, conversational, and generally a good historian. Pt reports having been able to perform NWB transfers at home PTA, but this is difficult to believe as this would be VERY physically demanding for a young athlete, requiring a single-leg squat from a low surface and contralateral SLR akin to a partial depth pistol squat (based on pt's description). MaxA for bed mobility. Pt very pain limited and weak, unable to rise from bed despite desire. Functional mobility assessment demonstrates increased effort/time requirements, poor tolerance, and need for physical assistance, whereas the patient performed these at a higher level of independence PTA. Pt has significant needs for physical assistance for basic mobility, but has limited assistance at home, large habitus, and very fear avoidant of SNF placement at this time. Pt will benefit from skilled PT intervention to increase independence and safety with basic mobility in preparation for discharge to the venue listed below.       Follow Up Recommendations SNF;Supervision for mobility/OOB(Pt very concerned about contracting COVID, not yet agreeable.)    Equipment Recommendations  None recommended by PT    Recommendations for Other Services       Precautions / Restrictions Precautions Precautions: Fall Restrictions LLE Weight Bearing:  Non weight bearing      Mobility  Bed Mobility Overal bed mobility: Needs Assistance Bed Mobility: Supine to Sit;Sit to Supine     Supine to sit: Max assist Sit to supine: Max assist   General bed mobility comments: Tolerates sitting independently at EOB well x15 minutes for history collection and discussion of mobility.  Transfers                 General transfer comment: Makes initital attempt, but reports he can tell he is too weak to be successful. Pt desires to STS using WC facing him, BUE on WC arms, but pt encouraged to not attempt 180degree turn in such a weak state, encouraged lateral scoot, which he reports is not able to perform.  Ambulation/Gait                Stairs            Wheelchair Mobility    Modified Rankin (Stroke Patients Only)       Balance                                             Pertinent Vitals/Pain Pain Assessment: 0-10 Pain Score: 10-Worst pain ever Pain Location: L ankle Pain Descriptors / Indicators: Aching;Grimacing;Guarding;Sharp Pain Intervention(s): Limited activity within patient's tolerance;Monitored during session;Repositioned;Ice applied;RN gave pain meds during session    [place] expects to be discharged to:: Private residence Living Arrangements: Alone Available Help at Discharge: Available PRN/intermittently;Friend(s);Family Type of Home: Apartment Home Access: Level entry     Home  Layout: One level Home Equipment: Grab bars - toilet;Grab bars - tub/shower;Walker - 2 wheels;Hand held shower head;Shower seat - built in;Cane - single point;Wheelchair - manual;Cane - quad(has been sleeoing on couch as he has trouble coming up from his bed/boxspring which are on the floor.) Additional Comments: Pt reports that family and neighbors assist as needed.    Prior Function Level of Independence: Independent with assistive device(s)         Comments: Family/friends  assist with meals/IADLs, mod I for ADLs, per pt he is working on his walking with RW, uses his WC predominantly. Per RNCM who spoke with dtr, pt calls EMS nearly every day 2/2 being unable to get out of bed and/or falls and unable to get back up.     Hand Dominance   Dominant Hand: Right    Extremity/Trunk Assessment   Upper Extremity Assessment Upper Extremity Assessment: Overall WFL for tasks assessed(grossly at least 4+/5 bilat, pt reports intermittent issues with R shoulder (hx of surgery) but generally not limited)    Lower Extremity Assessment Lower Extremity Assessment: Defer to PT evaluation;LLE deficits/detail LLE Deficits / Details: hx of great toe amp, severe pain, hx neuropathy LLE: Unable to fully assess due to pain LLE Sensation: decreased light touch       Communication   Communication: No difficulties  Cognition Arousal/Alertness: Awake/alert Behavior During Therapy: WFL for tasks assessed/performed Overall Cognitive Status: Within Functional Limits for tasks assessed                                 General Comments: alert and oriented, questionable safety awareness, as pt reports feeling safe to return home without assist despite being unable to participate in any functional mobility or ADL tasks 2/2 pain      General Comments      Exercises Other Exercises Other Exercises: Pt educated in falls prevention, AE/DME for ADL, and facilitated problem solving task with pt to support improved safety awareness.   Assessment/Plan    PT Assessment Patient needs continued PT services  PT Problem List Decreased strength;Decreased activity tolerance;Decreased range of motion;Decreased mobility;Obesity       PT Treatment Interventions DME instruction;Gait training;Functional mobility training;Therapeutic activities;Therapeutic exercise;Balance training;Patient/family education    PT Goals (Current goals can be found in the Care Plan section)  Acute  Rehab PT Goals Patient Stated Goal: go home and take care of myself PT Goal Formulation: With patient Time For Goal Achievement: 02/15/19 Potential to Achieve Goals: Poor    Frequency 7X/week   Barriers to discharge Inaccessible home environment      Co-evaluation               AM-PAC PT "6 Clicks" Mobility  Outcome Measure Help needed turning from your back to your side while in a flat bed without using bedrails?: Total Help needed moving from lying on your back to sitting on the side of a flat bed without using bedrails?: Total Help needed moving to and from a bed to a chair (including a wheelchair)?: Total Help needed standing up from a chair using your arms (e.g., wheelchair or bedside chair)?: Total Help needed to walk in hospital room?: Total Help needed climbing 3-5 steps with a railing? : Total 6 Click Score: 6    End of Session   Activity Tolerance: Patient tolerated treatment well Patient left: in bed;with call bell/phone within reach;with family/visitor present;with SCD's reapplied Nurse Communication:  Mobility status PT Visit Diagnosis: Difficulty in walking, not elsewhere classified (R26.2);History of falling (Z91.81);Dizziness and giddiness (R42)    Time: [id number] PT Time Calculation (min) (ACUTE ONLY): 37 min   Charges:   PT Evaluation $PT Eval Moderate Complexity: 1 Mod PT Treatments $Therapeutic Exercise: 8-22 mins        2:14 PM, 02/01/19 Etta Grandchild, PT, DPT Physical Therapist - [facility]  [phone] ([place])   [place] 02/01/2019, 2:11 PM

## 2019-02-01 NOTE — TOC Progression Note (Signed)
Transition of Care [facility]) - Progression Note    Patient Details  Name: NEILS RAI MRN: [id number] Date of Birth: 1950/09/05  Transition of Care [facility]) CM/SW Contact  Su Hilt, RN Phone Number: 02/01/2019, 11:55 AM , Clinical Narrative:    I spoke with the patient and explained to him that [facility] has not had any cases of covid and that they are tested every week, he agreed to go ahead and accept the bed offer and to start insurance Josem Kaufmann   I notified Magda Paganini at [facility]  Expected Discharge Plan: [place] Barriers to Discharge: Continued Medical Work up  Expected Discharge Plan and Services Expected Discharge Plan: [place]   Discharge Planning Services: CM Consult   Living arrangements for the past 2 months: Single Family Home Expected Discharge Date: 02/01/19               DME Arranged: N/A         HH Arranged: PT, OT, Nurse's Aide HH Agency: [facility] Date [place] Agency Contacted: 02/01/19 Time [place]: [phone] Representative spoke with at [place]: [place] ([place]) Interventions    Readmission Risk Interventions No flowsheet data found.

## 2019-02-01 NOTE — Evaluation (Signed)
Occupational Therapy Evaluation Patient Details Name: Victor Wise MRN: [id number] DOB: Oct 26, 1950 Today's Date: 02/01/2019    History of Present Illness Pt is a 68yo male s/p ankle ORIF for non-healing ankle fracture from a recent fall. PMHx includes pacemaker, CAD s/p stent, DM, depression, GERD, HTN, stroke, UTI, CHF, and MI.   Clinical Impression   Pt seen for OT evaluation this date. Prior to hospital admission, pt was living by himself and independent per pt (using AD for mobility). Spoke with RNCM after session who was on the phone with pt's daughter who reports pt falls often and calls EMS 2/2 falls and/or being unable to get out of bed nearly every day. Pt did not mention this during session and only endorsed 1 fall in past 12 months. Pt reports having a comfort height toilet at home, w/c, walk in shower, tub bench, and grab bars that he uses. Pt reports having life alert pendant which he used to call EMS after his ankle fracture. Pt reports no family/friends/neighbors who can assist when he leaves but is adamant about not going to SNF for rehab 2/2 Covid risk. Despite education in need for significant assist for any mobility and ADL tasks at this time 2/2 pain, pt continues to refuse rehab, despite therapist's recommendations and reports he will be fine to return home without any assist and will just call EMS again if he falls. Pt instructed in AE/DME and modifications to LB ADL tasks to improve safety/independence. Pt unable to attempt 2/2 pain this date and refuses any mobility for the same reason. Currently pt demonstrates impairments in pain (RN in room to give pain meds during session), strength, balance given hx of falls, and need for significant increased assist for all bathing, dressing, and toileting tasks. Pt will benefit from skilled OT Services to maximize safety and independence while minimizing future falls risk and caregiver burden.  Upon hospital discharge, recommend pt discharge  to SNF. RNCM notified.    Follow Up Recommendations  SNF    Equipment Recommendations  3 in 1 bedside commode    Recommendations for Other Services       Precautions / Restrictions Precautions Precautions: Fall Restrictions LLE Weight Bearing: Non weight bearing      Mobility Bed Mobility               General bed mobility comments: pt declines 2/2 severe pain  Transfers                 General transfer comment: pt declines 2/2 severe pain    Balance                                           ADL either performed or assessed with clinical judgement   ADL                                         General ADL Comments: currently very pain limited, requires Max A from bed level for bathing, dressing, and toileting     Vision Baseline Vision/History: No visual deficits Patient Visual Report: No change from baseline Vision Assessment?: No apparent visual deficits     Perception     Praxis      Pertinent Vitals/Pain Pain Assessment: 0-10 Pain Score: 10-Worst pain  ever Pain Location: L ankle Pain Descriptors / Indicators: Aching;Grimacing;Guarding;Sharp Pain Intervention(s): Limited activity within patient's tolerance;Monitored during session;Repositioned;Ice applied;RN gave pain meds during session     Hand Dominance Right   Extremity/Trunk Assessment Upper Extremity Assessment Upper Extremity Assessment: Overall WFL for tasks assessed(grossly at least 4+/5 bilat, pt reports intermittent issues with R shoulder (hx of surgery) but generally not limited)   Lower Extremity Assessment Lower Extremity Assessment: Defer to PT evaluation;LLE deficits/detail LLE Deficits / Details: hx of great toe amp, severe pain, hx neuropathy LLE: Unable to fully assess due to pain LLE Sensation: decreased light touch       Communication Communication Communication: No difficulties   Cognition Arousal/Alertness:  Awake/alert Behavior During Therapy: WFL for tasks assessed/performed Overall Cognitive Status: Within Functional Limits for tasks assessed                                 General Comments: alert and oriented, questionable safety awareness, as pt reports feeling safe to return home without assist despite being unable to participate in any functional mobility or ADL tasks 2/2 pain   General Comments       Exercises Other Exercises Other Exercises: Pt educated in falls prevention, AE/DME for ADL, and facilitated problem solving task with pt to support improved safety awareness.   Shoulder Instructions      Home Living Family/patient expects to be discharged to:: Private residence Living Arrangements: Alone Available Help at Discharge: Available PRN/intermittently;Friend(s);Family Type of Home: Apartment Home Access: Level entry     Home Layout: One level     Bathroom Shower/Tub: Occupational psychologist: Handicapped height     Home Equipment: Grab bars - toilet;Grab bars - tub/shower;Walker - 2 wheels;Hand held shower head;Shower seat - built in;Cane - single point;Wheelchair - manual;Cane - quad(has been sleeoing on couch as he has trouble coming up from his bed/boxspring which are on the floor.)   Additional Comments: Pt reports that family and neighbors assist as needed.      Prior Functioning/Environment Level of Independence: Independent with assistive device(s)        Comments: Family/friends assist with meals/IADLs, mod I for ADLs, per pt he is working on his walking with RW, uses his WC predominantly. Per RNCM who spoke with dtr, pt calls EMS nearly every day 2/2 being unable to get out of bed and/or falls and unable to get back up.        OT Problem List: Decreased strength;Pain;Decreased range of motion;Decreased safety awareness;Impaired balance (sitting and/or standing);Obesity;Decreased knowledge of use of DME or AE      OT  Treatment/Interventions: Self-care/ADL training;Therapeutic exercise;Therapeutic activities;DME and/or AE instruction;Patient/family education;Balance training    OT Goals(Current goals can be found in the care plan section) Acute Rehab OT Goals Patient Stated Goal: go home and take care of myself OT Goal Formulation: With patient Time For Goal Achievement: 02/15/19 Potential to Achieve Goals: Fair ADL Goals Pt Will Perform Lower Body Dressing: with min assist;sit to/from stand;with adaptive equipment;with mod assist Pt Will Transfer to Toilet: with mod assist;bedside commode;stand pivot transfer Pt Will Perform Toileting - Clothing Manipulation and hygiene: with min guard assist;sitting/lateral leans Additional ADL Goal #1: Pt will perform bed mobility with supervision  OT Frequency: Min 1X/week   Barriers to D/C: Decreased caregiver support          Co-evaluation  AM-PAC OT "6 Clicks" Daily Activity     Outcome Measure Help from another person eating meals?: None Help from another person taking care of personal grooming?: None Help from another person toileting, which includes using toliet, bedpan, or urinal?: A Lot Help from another person bathing (including washing, rinsing, drying)?: A Lot Help from another person to put on and taking off regular upper body clothing?: A Lot Help from another person to put on and taking off regular lower body clothing?: A Lot 6 Click Score: 16   End of Session    Activity Tolerance: Patient limited by pain Patient left: in bed;with call bell/phone within reach;with bed alarm set;with nursing/sitter in room;with SCD's reapplied  OT Visit Diagnosis: Other abnormalities of gait and mobility (R26.89);Repeated falls (R29.6);Muscle weakness (generalized) (M62.81);Pain Pain - Right/Left: Left Pain - part of body: Ankle and joints of foot                Time: [id number] OT Time Calculation (min): 14 min Charges:  OT General  Charges $OT Visit: 1 Visit OT Evaluation $OT Eval Moderate Complexity: 1 Mod OT Treatments $Self Care/Home Management : 8-22 mins  Jeni Salles, MPH, MS, OTR/L ascom [phone] 02/01/19, 11:04 AM

## 2019-02-02 LAB — GLUCOSE, CAPILLARY
Glucose-Capillary: 200 mg/dL — ABNORMAL HIGH (ref 70–99)
Glucose-Capillary: 271 mg/dL — ABNORMAL HIGH (ref 70–99)
Glucose-Capillary: 204 mg/dL — ABNORMAL HIGH (ref 70–99)
Glucose-Capillary: 221 mg/dL — ABNORMAL HIGH (ref 70–99)

## 2019-02-02 LAB — BASIC METABOLIC PANEL
Anion gap: 8 (ref 5–15)
BUN: 22 mg/dL (ref 8–23)
CO2: 26 mmol/L (ref 22–32)
Calcium: 8.2 mg/dL — ABNORMAL LOW (ref 8.9–10.3)
Chloride: 105 mmol/L (ref 98–111)
Creatinine, Ser: 1.09 mg/dL (ref 0.61–1.24)
GFR calc Af Amer: >60 mL/min (ref >60)
GFR calc non Af Amer: >60 mL/min (ref >60)
Glucose, Bld: 254 mg/dL — ABNORMAL HIGH (ref 70–99)
Potassium: 4.2 mmol/L (ref 3.5–5.1)
Sodium: 139 mmol/L (ref 135–145)

## 2019-02-02 LAB — CBC
HCT: 27.6 % — ABNORMAL LOW (ref 39.0–52.0)
Hemoglobin: 8.7 g/dL — ABNORMAL LOW (ref 13.0–17.0)
MCH: 26 pg (ref 26.0–34.0)
MCHC: 31.5 g/dL (ref 30.0–36.0)
MCV: 82.6 fL (ref 80.0–100.0)
Platelets: 158 10*3/uL (ref 150–400)
RBC: 3.34 MIL/uL — ABNORMAL LOW (ref 4.22–5.81)
RDW: 16.4 % — ABNORMAL HIGH (ref 11.5–15.5)
WBC: 5.4 10*3/uL (ref 4.0–10.5)
nRBC: 0 % (ref 0.0–0.2)

## 2019-02-02 NOTE — Progress Notes (Signed)
Added water to patient CPAP resivor. Patient applied mask and adjusted for comfort.

## 2019-02-02 NOTE — Progress Notes (Signed)
[facility] [place] NAME: Victor Wise    MR#:  [id number]  DATE OF BIRTH:  Jul 23, 1950  SUBJECTIVE:   Patient is status post repair of a bimalleolar fracture of the left ankle postop day #2 today.    Pain in the left ankle has improved since yesterday.  REVIEW OF SYSTEMS:    Review of Systems  Constitutional: Negative for chills and fever.  HENT: Negative for congestion and tinnitus.   Eyes: Negative for blurred vision and double vision.  Respiratory: Negative for cough, shortness of breath and wheezing.   Cardiovascular: Negative for chest pain, orthopnea and PND.  Gastrointestinal: Negative for abdominal pain, diarrhea, nausea and vomiting.  Genitourinary: Negative for dysuria and hematuria.  Musculoskeletal: Positive for falls and joint pain (Left Ankle).  Neurological: Negative for dizziness, sensory change and focal weakness.  All other systems reviewed and are negative.   Nutrition: Regular Tolerating Diet: Yes Tolerating PT: Eval noted.    DRUG ALLERGIES:   Allergies  Allergen Reactions  . Amoxicillin Other (See Comments)    Has never taken amoxicillin -ENT told him not to take (? Had skin test) Has patient had a PCN reaction causing immediate rash, facial/tongue/throat swelling, SOB or lightheadedness with hypotension: No Has patient had a PCN reaction causing severe rash involving mucus membranes or skin necrosis: No Has patient had a PCN reaction that required hospitalization No Has patient had a PCN reaction occurring within the last 10 years: No If above answers are "NO", then may proceed w/ Cephalo  . Other Anaphylaxis, Itching and Other (See Comments)    Pt states that he is allergic to Endopa.  Reaction:  Anaphylaxis  Pt states that he is allergic to Metabisulfites Reaction:  Itching   . Penicillins Other (See Comments)    Arm turned "blue" at injection site (no treatment needed) Has patient had a PCN reaction  causing immediate rash, facial/tongue/throat swelling, SOB or lightheadedness with hypotension: No Has patient had a PCN reaction causing severe rash involving mucus membranes or skin necrosis: No Has patient had a PCN reaction that required hospitalization No Has patient had a PCN reaction occurring within the last 10 years: No If all of the above answers are "NO", then may proceed with Cephalosporin use.  [name] Hydralazine Other (See Comments)    Reaction:  Cramping of extremities Pt reports tolerating low dose 12.5mg  but no higher.  . Cephalexin Itching  . Clindamycin Itching  . Crestor [Rosuvastatin Calcium] Other (See Comments)    Reaction:  Pt is unable to move arms/legs   . Metoprolol Nausea And Vomiting  . Red Dye Itching  . Statins Other (See Comments)    Reaction:  Pt is unable to move arms/legs  . Sulfa Antibiotics Itching    Other reaction(s): Unknown  . Yellow Dyes (Non-Tartrazine) Itching  . Aspirin Itching and Other (See Comments)    Pt states that he is able to use in lower doses.    . Tape Rash    Please use "paper" tape only. Please use "paper" tape only.    VITALS:  Blood pressure 119/81, pulse (!) 59, temperature 97.7 F (36.5 C), temperature source Oral, resp. rate 19, SpO2 100 %.  PHYSICAL EXAMINATION:   Physical Exam  GENERAL:  68 y.o.-year-old patient lying in bed complaining of pain in the left foot/ankle.  EYES: Pupils equal, round, reactive to light and accommodation. No scleral icterus. Extraocular muscles intact.  HEENT:  Head atraumatic, normocephalic. Oropharynx and nasopharynx clear.  NECK:  Supple, no jugular venous distention. No thyroid enlargement, no tenderness.  LUNGS: Normal breath sounds bilaterally, no wheezing, rales, rhonchi. No use of accessory muscles of respiration.  CARDIOVASCULAR: S1, S2 normal. No murmurs, rubs, or gallops.  ABDOMEN: Soft, nontender, nondistended. Bowel sounds present. No organomegaly or mass.  EXTREMITIES: No  cyanosis, clubbing or edema b/l.  Left foot/ankle in cast/boot.   NEUROLOGIC: Cranial nerves II through XII are intact. No focal Motor or sensory deficits b/l.   PSYCHIATRIC: The patient is alert and oriented x 3.  SKIN: No obvious rash, lesion, or ulcer.    LABORATORY PANEL:   CBC Recent Labs  Lab 02/02/19 0533  WBC 5.4  HGB 8.7*  HCT 27.6*  PLT 158   ------------------------------------------------------------------------------------------------------------------  Chemistries  Recent Labs  Lab 01/28/19 1000  02/02/19 0533  NA 142   < > 139  K 4.0   < > 4.2  CL 101   < > 105  CO2 26   < > 26  GLUCOSE 114*   < > 254*  BUN 25   < > 22  CREATININE 1.03   < > 1.09  CALCIUM 9.3   < > 8.2*  AST 16  --   --   ALT 10  --   --   ALKPHOS 77  --   --   BILITOT <0.2  --   --    < > = values in this interval not displayed.   ------------------------------------------------------------------------------------------------------------------  Cardiac Enzymes No results for input(s): TROPONINI in the last 168 hours. ------------------------------------------------------------------------------------------------------------------  RADIOLOGY:  Dg Ankle Left Port  Result Date: 01/31/2019 CLINICAL DATA:  Status post left ankle fixation. EXAM: PORTABLE LEFT ANKLE - 2 VIEW COMPARISON:  January 03, 2019 FINDINGS: Post sideplate and screw fixation of the left lateral malleolar fracture, and 2 cancellous screw fixation of the medial malleolar fracture. Minimal improvement in alignment. Overlying soft tissue swelling. IMPRESSION: Post ORIF of medial and lateral malleolar fractures. Electronically Signed   By: Fidela Salisbury M.D.   On: 01/31/2019 16:11     ASSESSMENT AND PLAN:   68 year old male with past medical history of diabetes, GERD, hypertension, previous MI, morbid obesity, obstructive sleep apnea, history of previous CVA, BPH, chronic back pain who presented to the hospital  after a fall and noted to have a left ankle fracture.  1.  Status post fall left ankle fracture-patient is status post repair of a bimalleolar left ankle fracture.  Postop day #2 today. -Patient having significant pain in the left foot and ankle still. -Patient is on chronic pain meds at home.  Continue OxyContin, oxycodone as needed along with Dilaudid as needed.  2.  DM type II with neuropathy-continue Lantus, sliding scale insulin.   - BS stable and will cont. To monitor.   3.  History of previous CVA-continue Plavix, fenofibrate, Zetia, Lopid  4.  History of depression-continue Cymbalta.  5.  GERD-continue Protonix.  6.  Peripheral neuropathy-continue Lyrica.  7.  BPH-continue Flomax.  8. Chronic Pain - cont. Oxycontin.   All the records are reviewed and case discussed with Care Management/Social Worker. Management plans discussed with the patient, family and they are in agreement.  CODE STATUS: Full code  DVT Prophylaxis: Lovenox  TOTAL TIME TAKING CARE OF THIS PATIENT: 25 minutes.   POSSIBLE D/C IN 2-3 DAYS, DEPENDING ON CLINICAL CONDITION.   Henreitta Leber M.D on 02/02/2019 at 12:40 PM  Between  7am to 6pm - Pager - [phone]  After 6pm go to [email or web address] - password [email or web address] [facility]  Office  [phone]  CC: Primary care physician; Lavera Guise, MD

## 2019-02-02 NOTE — Progress Notes (Signed)
Subjective:  Patient reports pain as moderate.    POD #2 s/p ORIF left ankle.   Patient reports left ankle pain as marked.  Patient is on chronic pain medications including OxyContin at home.  Says pain is 10/10 Objective:   VITALS:   Vitals:   02/01/19 0935 02/01/19 1241 02/01/19 1557 02/02/19 0000  BP:  125/65 (!) 106/55 129/63  Pulse: 68 60 64 (!) 59  Resp:  16 16 19   Temp:  98 F (36.7 C) 98.1 F (36.7 C) 98.1 F (36.7 C)  TempSrc:  Oral  Oral  SpO2: 96% 93% 93% 97%    PHYSICAL EXAM:  Neurologically intact ABD soft Neurovascular intact Sensation intact distally Intact pulses distally Dorsiflexion/Plantar flexion intact Incision: dressing C/D/I No cellulitis present Compartment soft  LABS  Results for orders placed or performed during the hospital encounter of 01/31/19 (from the past 24 hour(s))  Glucose, capillary     Status: Abnormal   Collection Time: 02/01/19  7:49 AM  Result Value Ref Range   Glucose-Capillary 143 (H) 70 - 99 mg/dL  Glucose, capillary     Status: Abnormal   Collection Time: 02/01/19 11:46 AM  Result Value Ref Range   Glucose-Capillary 285 (H) 70 - 99 mg/dL   Comment 1 Notify RN    Comment 2 Document in Chart   Glucose, capillary     Status: Abnormal   Collection Time: 02/01/19  4:52 PM  Result Value Ref Range   Glucose-Capillary 294 (H) 70 - 99 mg/dL   Comment 1 Notify RN    Comment 2 Document in Chart   Glucose, capillary     Status: Abnormal   Collection Time: 02/01/19  9:00 PM  Result Value Ref Range   Glucose-Capillary 259 (H) 70 - 99 mg/dL  CBC     Status: Abnormal   Collection Time: 02/02/19  5:33 AM  Result Value Ref Range   WBC 5.4 4.0 - 10.5 K/uL   RBC 3.34 (L) 4.22 - 5.81 MIL/uL   Hemoglobin 8.7 (L) 13.0 - 17.0 g/dL   HCT 27.6 (L) 39.0 - 52.0 %   MCV 82.6 80.0 - 100.0 fL   MCH 26.0 26.0 - 34.0 pg   MCHC 31.5 30.0 - 36.0 g/dL   RDW 16.4 (H) 11.5 - 15.5 %   Platelets 158 150 - 400 K/uL   nRBC 0.0 0.0 - 0.2 %   Basic metabolic panel     Status: Abnormal   Collection Time: 02/02/19  5:33 AM  Result Value Ref Range   Sodium 139 135 - 145 mmol/L   Potassium 4.2 3.5 - 5.1 mmol/L   Chloride 105 98 - 111 mmol/L   CO2 26 22 - 32 mmol/L   Glucose, Bld 254 (H) 70 - 99 mg/dL   BUN 22 8 - 23 mg/dL   Creatinine, Ser 1.09 0.61 - 1.24 mg/dL   Calcium 8.2 (L) 8.9 - 10.3 mg/dL   GFR calc non Af Amer >60 >60 mL/min   GFR calc Af Amer >60 >60 mL/min   Anion gap 8 5 - 15    Dg Ankle Left Port  Result Date: 01/31/2019 CLINICAL DATA:  Status post left ankle fixation. EXAM: PORTABLE LEFT ANKLE - 2 VIEW COMPARISON:  January 03, 2019 FINDINGS: Post sideplate and screw fixation of the left lateral malleolar fracture, and 2 cancellous screw fixation of the medial malleolar fracture. Minimal improvement in alignment. Overlying soft tissue swelling. IMPRESSION: Post ORIF of medial and lateral malleolar  fractures. Electronically Signed   By: Fidela Salisbury M.D.   On: 01/31/2019 16:11    Assessment/Plan: 2 Days Post-Op   Active Problems:   Bimalleolar fracture of left ankle   Bimalleolar fracture of left ankle  Patient will continue with physical therapy.  He has agreed to go to skilled nursing facility, [facility], upon discharge.  This is medically necessary as the patient has been unable to care for himself adequately at home and is a significant fall risk.  Patient will continue to elevate his left lower extremity.  Apply ice to the left ankle. Continue pain control.  Follow up with Dr. Alfonso Ramus office in 2 weeks.  Call for appointment. [phone].  Carlynn Spry , PA-C 02/02/2019, 7:31 AM

## 2019-02-02 NOTE — Progress Notes (Signed)
Physical Therapy Treatment Patient Details Name: Victor Wise MRN: [id number] DOB: 1951/01/17 Today's Date: 02/02/2019    History of Present Illness Pt is a 68yo male s/p ankle ORIF for non-healing ankle fracture from a recent fall. PMHx includes pacemaker, CAD s/p stent, DM, depression, GERD, HTN, stroke, UTI, CHF, and MI.    PT Comments    Pt in bed, agrees to supine activity but requests no OOB.  While attempting ex, pt noted to be inc urine saturating pads and gown.  Pt stated he was aware but did not alert staff.  Education provided regarding importance of care and risk of skin breakdown.  Rolling left/right for care with mod a x 2.  Heavy tactile and verbal cues to reach and then let go of rail to roll back over.  Seems hesitant due to pain.  Refused further activity due to pain.  RN in to assist and pt has had all pain medication available to him at this time.   Follow Up Recommendations  SNF;Supervision for mobility/OOB     Equipment Recommendations  None recommended by PT    Recommendations for Other Services       Precautions / Restrictions Precautions Precautions: Fall Restrictions Weight Bearing Restrictions: Yes LLE Weight Bearing: Non weight bearing    Mobility  Bed Mobility Overal bed mobility: Needs Assistance Bed Mobility: Rolling Rolling: Mod assist;+2 for physical assistance;+2 for safety/equipment            Transfers                    Ambulation/Gait                 Stairs             Wheelchair Mobility    Modified Rankin (Stroke Patients Only)       Balance                                            Cognition Arousal/Alertness: Awake/alert Behavior During Therapy: WFL for tasks assessed/performed Overall Cognitive Status: Within Functional Limits for tasks assessed                                        Exercises      General Comments        Pertinent Vitals/Pain  Pain Assessment: Faces Faces Pain Scale: Hurts whole lot Pain Location: L ankle with any attempts at movement and rolling for linen change Pain Descriptors / Indicators: Aching;Grimacing;Guarding;Sharp Pain Intervention(s): Monitored during session;Premedicated before session;Limited activity within patient's tolerance    Home Living                      Prior Function            PT Goals (current goals can now be found in the care plan section) Progress towards PT goals: Not progressing toward goals - comment    Frequency    7X/week      PT Plan Current plan remains appropriate    Co-evaluation              AM-PAC PT "6 Clicks" Mobility   Outcome Measure  Help needed turning from your back to your side while in a flat bed  without using bedrails?: Total Help needed moving from lying on your back to sitting on the side of a flat bed without using bedrails?: Total Help needed moving to and from a bed to a chair (including a wheelchair)?: Total Help needed standing up from a chair using your arms (e.g., wheelchair or bedside chair)?: Total Help needed to walk in hospital room?: Total Help needed climbing 3-5 steps with a railing? : Total 6 Click Score: 6    End of Session   Activity Tolerance: Patient limited by pain Patient left: in bed;with call bell/phone within reach;with SCD's reapplied Nurse Communication: Mobility status       Time: [id number] PT Time Calculation (min) (ACUTE ONLY): 11 min  Charges:  $Therapeutic Activity: 8-22 mins                    Chesley Noon, PTA 02/02/19, 11:46 AM

## 2019-02-03 LAB — CBC
HCT: 26.5 % — ABNORMAL LOW (ref 39.0–52.0)
Hemoglobin: 8.6 g/dL — ABNORMAL LOW (ref 13.0–17.0)
MCH: 26.7 pg (ref 26.0–34.0)
MCHC: 32.5 g/dL (ref 30.0–36.0)
MCV: 82.3 fL (ref 80.0–100.0)
Platelets: 149 10*3/uL — ABNORMAL LOW (ref 150–400)
RBC: 3.22 MIL/uL — ABNORMAL LOW (ref 4.22–5.81)
RDW: 16.5 % — ABNORMAL HIGH (ref 11.5–15.5)
WBC: 5.4 10*3/uL (ref 4.0–10.5)
nRBC: 0 % (ref 0.0–0.2)

## 2019-02-03 LAB — GLUCOSE, CAPILLARY
Glucose-Capillary: 94 mg/dL (ref 70–99)
Glucose-Capillary: 93 mg/dL (ref 70–99)
Glucose-Capillary: 139 mg/dL — ABNORMAL HIGH (ref 70–99)
Glucose-Capillary: 208 mg/dL — ABNORMAL HIGH (ref 70–99)

## 2019-02-03 NOTE — Progress Notes (Signed)
This nurse resumed care of patient at 1pm from Victor Wise

## 2019-02-03 NOTE — Progress Notes (Signed)
PT Cancellation Note  Patient Details Name: Victor Wise MRN: [id number] DOB: 25-Oct-1950   Cancelled Treatment:    Reason Eval/Treat Not Completed: Other (comment);Patient declined, no reason specified(Patient reports he needs to be cleaned up, new bedding, and bath prior to PT. CNA notified. Will attempt again at later time/date.)  [name], PT, DPT   02/03/2019, 2:55 PM

## 2019-02-03 NOTE — Progress Notes (Signed)
Subjective: 3 Days Post-Op Procedure(s) (LRB): open reduction internal fixation left bimalleolar ankle fracture (Left)   Sitting up in bed and watching TV.  Alert and cooperative.  Has not made much effort for PT.  Declined this morning because he was eating breakfast.  Pain is better today he says.  Patient reports pain as moderate.  Objective:   VITALS:   Vitals:   02/02/19 2323 02/03/19 0830  BP: 112/64 112/69  Pulse: (!) 59 (!) 59  Resp: 18 18  Temp: 100.1 F (37.8 C) 98.3 F (36.8 C)  SpO2: 98% 97%    Neurologically intact ABD soft Neurovascular intact Sensation intact distally Intact pulses distally Dorsiflexion/Plantar flexion intact Incision: dressing C/D/I  LABS Recent Labs    02/01/19 0436 02/02/19 0533 02/03/19 0423  HGB 9.7* 8.7* 8.6*  HCT 30.1* 27.6* 26.5*  WBC 8.1 5.4 5.4  PLT 207 158 149*    Recent Labs    02/01/19 0436 02/02/19 0533  NA 140 139  K 3.5 4.2  BUN 35* 22  CREATININE 1.39* 1.09  GLUCOSE 155* 254*    No results for input(s): LABPT, INR in the last 72 hours.   Assessment/Plan: 3 Days Post-Op Procedure(s) (LRB): open reduction internal fixation left bimalleolar ankle fracture (Left)   Advance diet Up with therapy Discharge to SNF

## 2019-02-03 NOTE — Progress Notes (Signed)
[facility] [place] NAME: Victor Wise    MR#:  [id number]  DATE OF BIRTH:  January 12, 1951  SUBJECTIVE:   Patient is status post repair of a bimalleolar fracture of the left ankle postop day #3 today.    Patient not working with physical therapy much, says pain in the left ankle has improved though.  REVIEW OF SYSTEMS:    Review of Systems  Constitutional: Negative for chills and fever.  HENT: Negative for congestion and tinnitus.   Eyes: Negative for blurred vision and double vision.  Respiratory: Negative for cough, shortness of breath and wheezing.   Cardiovascular: Negative for chest pain, orthopnea and PND.  Gastrointestinal: Negative for abdominal pain, diarrhea, nausea and vomiting.  Genitourinary: Negative for dysuria and hematuria.  Musculoskeletal: Positive for falls and joint pain (Left Ankle).  Neurological: Negative for dizziness, sensory change and focal weakness.  All other systems reviewed and are negative.   Nutrition: Regular Tolerating Diet: Yes Tolerating PT: Eval noted.    DRUG ALLERGIES:   Allergies  Allergen Reactions  . Amoxicillin Other (See Comments)    Has never taken amoxicillin -ENT told him not to take (? Had skin test) Has patient had a PCN reaction causing immediate rash, facial/tongue/throat swelling, SOB or lightheadedness with hypotension: No Has patient had a PCN reaction causing severe rash involving mucus membranes or skin necrosis: No Has patient had a PCN reaction that required hospitalization No Has patient had a PCN reaction occurring within the last 10 years: No If above answers are "NO", then may proceed w/ Cephalo  . Other Anaphylaxis, Itching and Other (See Comments)    Pt states that he is allergic to Endopa.  Reaction:  Anaphylaxis  Pt states that he is allergic to Metabisulfites Reaction:  Itching   . Penicillins Other (See Comments)    Arm turned "blue" at injection site (no  treatment needed) Has patient had a PCN reaction causing immediate rash, facial/tongue/throat swelling, SOB or lightheadedness with hypotension: No Has patient had a PCN reaction causing severe rash involving mucus membranes or skin necrosis: No Has patient had a PCN reaction that required hospitalization No Has patient had a PCN reaction occurring within the last 10 years: No If all of the above answers are "NO", then may proceed with Cephalosporin use.  [name] Hydralazine Other (See Comments)    Reaction:  Cramping of extremities Pt reports tolerating low dose 12.5mg  but no higher.  . Cephalexin Itching  . Clindamycin Itching  . Crestor [Rosuvastatin Calcium] Other (See Comments)    Reaction:  Pt is unable to move arms/legs   . Metoprolol Nausea And Vomiting  . Red Dye Itching  . Statins Other (See Comments)    Reaction:  Pt is unable to move arms/legs  . Sulfa Antibiotics Itching    Other reaction(s): Unknown  . Yellow Dyes (Non-Tartrazine) Itching  . Aspirin Itching and Other (See Comments)    Pt states that he is able to use in lower doses.    . Tape Rash    Please use "paper" tape only. Please use "paper" tape only.    VITALS:  Blood pressure 112/69, pulse (!) 59, temperature 98.3 F (36.8 C), temperature source Oral, resp. rate 18, SpO2 97 %.  PHYSICAL EXAMINATION:   Physical Exam  GENERAL:  68 y.o.-year-old patient lying in bed complaining of pain in the left foot/ankle.  EYES: Pupils equal, round, reactive to light and accommodation. No  scleral icterus. Extraocular muscles intact.  HEENT: Head atraumatic, normocephalic. Oropharynx and nasopharynx clear.  NECK:  Supple, no jugular venous distention. No thyroid enlargement, no tenderness.  LUNGS: Normal breath sounds bilaterally, no wheezing, rales, rhonchi. No use of accessory muscles of respiration.  CARDIOVASCULAR: S1, S2 normal. No murmurs, rubs, or gallops.  ABDOMEN: Soft, nontender, nondistended. Bowel sounds present.  No organomegaly or mass.  EXTREMITIES: No cyanosis, clubbing or edema b/l.  Left foot/ankle in cast/boot.   NEUROLOGIC: Cranial nerves II through XII are intact. No focal Motor or sensory deficits b/l.   PSYCHIATRIC: The patient is alert and oriented x 3.  SKIN: No obvious rash, lesion, or ulcer.    LABORATORY PANEL:   CBC Recent Labs  Lab 02/03/19 0423  WBC 5.4  HGB 8.6*  HCT 26.5*  PLT 149*   ------------------------------------------------------------------------------------------------------------------  Chemistries  Recent Labs  Lab 01/28/19 1000  02/02/19 0533  NA 142   < > 139  K 4.0   < > 4.2  CL 101   < > 105  CO2 26   < > 26  GLUCOSE 114*   < > 254*  BUN 25   < > 22  CREATININE 1.03   < > 1.09  CALCIUM 9.3   < > 8.2*  AST 16  --   --   ALT 10  --   --   ALKPHOS 77  --   --   BILITOT <0.2  --   --    < > = values in this interval not displayed.   ------------------------------------------------------------------------------------------------------------------  Cardiac Enzymes No results for input(s): TROPONINI in the last 168 hours. ------------------------------------------------------------------------------------------------------------------  RADIOLOGY:  No results found.   ASSESSMENT AND PLAN:   68 year old male with past medical history of diabetes, GERD, hypertension, previous MI, morbid obesity, obstructive sleep apnea, history of previous CVA, BPH, chronic back pain who presented to the hospital after a fall and noted to have a left ankle fracture.  1.  Status post fall left ankle fracture-patient is status post repair of a bimalleolar left ankle fracture.  Postop day #3 today. -Patient is on chronic pain meds at home.  Continue OxyContin, oxycodone as needed along with Dilaudid as needed. -Continue PT as tolerated  2.  DM type II with neuropathy-continue Lantus, sliding scale insulin.   - BS stable and will cont. To monitor.   3.  History  of previous CVA-continue Plavix, fenofibrate, Zetia, Lopid  4.  History of depression-continue Cymbalta.  5.  GERD-continue Protonix.  6.  Peripheral neuropathy-continue Lyrica.  7.  BPH-continue Flomax.  8. Chronic Pain - cont. Oxycontin.   We will repeat COVID-19 test and plan for possible discharge to rehab next week.  Insurance authorization for discharge to short-term rehab at [facility].  All the records are reviewed and case discussed with Care Management/Social Worker. Management plans discussed with the patient, family and they are in agreement.  CODE STATUS: Full code  DVT Prophylaxis: Lovenox  TOTAL TIME TAKING CARE OF THIS PATIENT: 25 minutes.   POSSIBLE D/C IN 1-2 DAYS, DEPENDING ON CLINICAL CONDITION.   Henreitta Leber M.D on 02/03/2019 at 11:56 AM  Between 7am to 6pm - Pager - [phone]  After 6pm go to [email or web address] - password [email or web address] [facility]  Office  [phone]  CC: Primary care physician; Lavera Guise, MD
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
## 2019-02-03 NOTE — Progress Notes (Signed)
PT Cancellation Note  Patient Details Name: Victor Wise MRN: [id number] DOB: April 01, 1951   Cancelled Treatment:    Reason Eval/Treat Not Completed: Patient declined, no reason specified;Other (comment)(Patient refused PT due to just starting to eat his breakfast. Will attempt again at later time/date.)  [name], PT, DPT   02/03/2019, 11:26 AM

## 2019-02-04 LAB — GLUCOSE, CAPILLARY
Glucose-Capillary: 204 mg/dL — ABNORMAL HIGH (ref 70–99)
Glucose-Capillary: 159 mg/dL — ABNORMAL HIGH (ref 70–99)
Glucose-Capillary: 256 mg/dL — ABNORMAL HIGH (ref 70–99)
Glucose-Capillary: 273 mg/dL — ABNORMAL HIGH (ref 70–99)
Glucose-Capillary: 246 mg/dL — ABNORMAL HIGH (ref 70–99)

## 2019-02-04 LAB — SARS CORONAVIRUS 2 (TAT 6-24 HRS): SARS Coronavirus 2: NEGATIVE
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
## 2019-02-04 NOTE — Progress Notes (Signed)
OT Cancellation Note  Patient Details Name: Victor Wise MRN: [id number] DOB: Jan 22, 1951   Cancelled Treatment:    Reason Eval/Treat Not Completed: Pain limiting ability to participate. Pt in the middle of eating breakfast, reporting pain medication administered in past 30 min hasn't "kicked in" yet and reporting 9/10 pain. Will re-attempt OT tx at later time as pt is able to participate.   Jeni Salles, MPH, MS, OTR/L ascom [phone] 02/04/19, 9:03 AM

## 2019-02-04 NOTE — Progress Notes (Signed)
Discussed with Dr. Mack Guise. Will Sign off patient for now.  Please call back if any further help needed.

## 2019-02-04 NOTE — Progress Notes (Signed)
  Subjective:  POD #4 s/p ORIF left bimalleolar ankle fracture.   Patient reports left ankle pain as mild to moderate.    Objective:   VITALS:   Vitals:   02/03/19 0830 02/03/19 1645 02/03/19 2306 02/04/19 0737  BP: 112/69 116/68 136/65 127/76  Pulse: (!) 59 62 61 (!) 59  Resp: 18 18 17    Temp: 98.3 F (36.8 C) 98.2 F (36.8 C) 98.7 F (37.1 C) 97.7 F (36.5 C)  TempSrc: Oral Axillary Oral Oral  SpO2: 97% 96% 98% 98%    PHYSICAL EXAM: Left lower extremity: Splint clean and dry. Toes well perfused with intact sensation.   Great toe amputated. +flex and extend toes     LABS  Results for orders placed or performed during the hospital encounter of 01/31/19 (from the past 24 hour(s))  Glucose, capillary     Status: Abnormal   Collection Time: 02/03/19  5:31 PM  Result Value Ref Range   Glucose-Capillary 139 (H) 70 - 99 mg/dL  Glucose, capillary     Status: Abnormal   Collection Time: 02/03/19  9:04 PM  Result Value Ref Range   Glucose-Capillary 208 (H) 70 - 99 mg/dL   Comment 1 Notify RN   Glucose, capillary     Status: Abnormal   Collection Time: 02/04/19  7:38 AM  Result Value Ref Range   Glucose-Capillary 159 (H) 70 - 99 mg/dL  Glucose, capillary     Status: Abnormal   Collection Time: 02/04/19 11:46 AM  Result Value Ref Range   Glucose-Capillary 256 (H) 70 - 99 mg/dL    No results found.  Assessment/Plan: 4 Days Post-Op   Active Problems:   Bimalleolar fracture of left ankle  Continue PT/OT.  Awaiting approval from [facility] for possible discharge tomorrow.   Continue current pain management and lovenox.    Thornton Park , MD 02/04/2019, 12:58 PM
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
## 2019-02-04 NOTE — Care Management Important Message (Signed)
Important Message  Patient Details  Name: JEREMEE DOMEN MRN: [id number] Date of Birth: 1950-12-01   Medicare Important Message Given:  Yes     Su Hilt, RN 02/04/2019, 11:43 AM

## 2019-02-05 ENCOUNTER — Inpatient Hospital Stay: Payer: Medicare HMO

## 2019-02-05 LAB — CBC
HCT: 29.2 % — ABNORMAL LOW (ref 39.0–52.0)
Hemoglobin: 9.4 g/dL — ABNORMAL LOW (ref 13.0–17.0)
MCH: 26.6 pg (ref 26.0–34.0)
MCHC: 32.2 g/dL (ref 30.0–36.0)
MCV: 82.7 fL (ref 80.0–100.0)
Platelets: 164 10*3/uL (ref 150–400)
RBC: 3.53 MIL/uL — ABNORMAL LOW (ref 4.22–5.81)
RDW: 16.8 % — ABNORMAL HIGH (ref 11.5–15.5)
WBC: 4.5 10*3/uL (ref 4.0–10.5)
nRBC: 0 % (ref 0.0–0.2)

## 2019-02-05 LAB — BASIC METABOLIC PANEL
Anion gap: 10 (ref 5–15)
BUN: 21 mg/dL (ref 8–23)
CO2: 26 mmol/L (ref 22–32)
Calcium: 9.2 mg/dL (ref 8.9–10.3)
Chloride: 106 mmol/L (ref 98–111)
Creatinine, Ser: 1.2 mg/dL (ref 0.61–1.24)
GFR calc Af Amer: >60 mL/min (ref >60)
GFR calc non Af Amer: >60 mL/min (ref >60)
Glucose, Bld: 189 mg/dL — ABNORMAL HIGH (ref 70–99)
Potassium: 4 mmol/L (ref 3.5–5.1)
Sodium: 142 mmol/L (ref 135–145)

## 2019-02-05 LAB — BLOOD GAS, ARTERIAL
Acid-Base Excess: 4.3 mmol/L — ABNORMAL HIGH (ref 0.0–2.0)
Allens test (pass/fail): POSITIVE — AB
Bicarbonate: 27.1 mmol/L (ref 20.0–28.0)
FIO2: 0.21
O2 Saturation: 96.1 %
Patient temperature: 37
pCO2 arterial: 34 mmHg (ref 32.0–48.0)
pH, Arterial: 7.51 — ABNORMAL HIGH (ref 7.350–7.450)
pO2, Arterial: 74 mmHg — ABNORMAL LOW (ref 83.0–108.0)

## 2019-02-05 LAB — GLUCOSE, CAPILLARY
Glucose-Capillary: 183 mg/dL — ABNORMAL HIGH (ref 70–99)
Glucose-Capillary: 150 mg/dL — ABNORMAL HIGH (ref 70–99)
Glucose-Capillary: 135 mg/dL — ABNORMAL HIGH (ref 70–99)

## 2019-02-05 LAB — AMMONIA: Ammonia: 31 umol/L (ref 9–35)

## 2019-02-05 NOTE — Progress Notes (Signed)
[place] at [place] NAME: Victor Wise    MR#:  [id number]  DATE OF BIRTH:  06-07-1950  SUBJECTIVE:   Patient is status post repair of a bimalleolar fracture of the left ankle postop day #5 today.    Patient is a bit drowsy today but follows commands.  A bit confused but arousable.  Still complaining of some pain in that left ankle.  REVIEW OF SYSTEMS:    Review of Systems  Constitutional: Negative for chills and fever.  HENT: Negative for congestion and tinnitus.   Eyes: Negative for blurred vision and double vision.  Respiratory: Negative for cough, shortness of breath and wheezing.   Cardiovascular: Negative for chest pain, orthopnea and PND.  Gastrointestinal: Negative for abdominal pain, diarrhea, nausea and vomiting.  Genitourinary: Negative for dysuria and hematuria.  Musculoskeletal: Positive for falls and joint pain (Left Ankle).  Neurological: Negative for dizziness, sensory change and focal weakness.  All other systems reviewed and are negative.   Nutrition: Regular Tolerating Diet: Yes Tolerating PT: Eval noted.    DRUG ALLERGIES:   Allergies  Allergen Reactions  . Amoxicillin Other (See Comments)    Has never taken amoxicillin -ENT told him not to take (? Had skin test) Has patient had a PCN reaction causing immediate rash, facial/tongue/throat swelling, SOB or lightheadedness with hypotension: No Has patient had a PCN reaction causing severe rash involving mucus membranes or skin necrosis: No Has patient had a PCN reaction that required hospitalization No Has patient had a PCN reaction occurring within the last 10 years: No If above answers are "NO", then may proceed w/ Cephalo  . Other Anaphylaxis, Itching and Other (See Comments)    Pt states that he is allergic to Endopa.  Reaction:  Anaphylaxis  Pt states that he is allergic to Metabisulfites Reaction:  Itching   . Penicillins Other (See Comments)    Arm  turned "blue" at injection site (no treatment needed) Has patient had a PCN reaction causing immediate rash, facial/tongue/throat swelling, SOB or lightheadedness with hypotension: No Has patient had a PCN reaction causing severe rash involving mucus membranes or skin necrosis: No Has patient had a PCN reaction that required hospitalization No Has patient had a PCN reaction occurring within the last 10 years: No If all of the above answers are "NO", then may proceed with Cephalosporin use.  [name] Hydralazine Other (See Comments)    Reaction:  Cramping of extremities Pt reports tolerating low dose 12.5mg  but no higher.  . Cephalexin Itching  . Clindamycin Itching  . Crestor [Rosuvastatin Calcium] Other (See Comments)    Reaction:  Pt is unable to move arms/legs   . Metoprolol Nausea And Vomiting  . Red Dye Itching  . Statins Other (See Comments)    Reaction:  Pt is unable to move arms/legs  . Sulfa Antibiotics Itching    Other reaction(s): Unknown  . Yellow Dyes (Non-Tartrazine) Itching  . Aspirin Itching and Other (See Comments)    Pt states that he is able to use in lower doses.    . Tape Rash    Please use "paper" tape only. Please use "paper" tape only.    VITALS:  Blood pressure (!) 146/98, pulse (!) 58, temperature 98.7 F (37.1 C), temperature source Oral, resp. rate 17, SpO2 95 %.  PHYSICAL EXAMINATION:   Physical Exam  GENERAL:  68 y.o.-year-old patient lying in bed lethargic but follows commands. EYES: Pupils equal, round,  reactive to light and accommodation. No scleral icterus. Extraocular muscles intact.  HEENT: Head atraumatic, normocephalic. Oropharynx and nasopharynx clear.  NECK:  Supple, no jugular venous distention. No thyroid enlargement, no tenderness.  LUNGS: Normal breath sounds bilaterally, no wheezing, rales, rhonchi. No use of accessory muscles of respiration.  CARDIOVASCULAR: S1, S2 normal. No murmurs, rubs, or gallops.  ABDOMEN: Soft, nontender,  nondistended. Bowel sounds present. No organomegaly or mass.  EXTREMITIES: No cyanosis, clubbing or edema b/l.  Left foot/ankle in cast/boot.   NEUROLOGIC: Cranial nerves II through XII are intact. No focal Motor or sensory deficits b/l. Globally weak PSYCHIATRIC: The patient is alert and oriented x 3.  SKIN: No obvious rash, lesion, or ulcer.    LABORATORY PANEL:   CBC Recent Labs  Lab 02/03/19 0423  WBC 5.4  HGB 8.6*  HCT 26.5*  PLT 149*   ------------------------------------------------------------------------------------------------------------------  Chemistries  Recent Labs  Lab 02/02/19 0533  NA 139  K 4.2  CL 105  CO2 26  GLUCOSE 254*  BUN 22  CREATININE 1.09  CALCIUM 8.2*   ------------------------------------------------------------------------------------------------------------------  Cardiac Enzymes No results for input(s): TROPONINI in the last 168 hours. ------------------------------------------------------------------------------------------------------------------  RADIOLOGY:  No results found.   ASSESSMENT AND PLAN:   69 year old male with past medical history of diabetes, GERD, hypertension, previous MI, morbid obesity, obstructive sleep apnea, history of previous CVA, BPH, chronic back pain who presented to the hospital after a fall and noted to have a left ankle fracture.  1.  Status post fall left ankle fracture-patient is status post repair of a bimalleolar left ankle fracture.  Postop day #5 today. -Patient is on chronic pain meds at home.  Continue OxyContin, oxycodone as needed and avoid IV Narcotics if possible.  -Continue PT as tolerated - cont Lovenox for DVT prophylaxis.   2.  Altered mental status/encephalopathy-patient is a bit lethargic and confused today.  I suspect this is all polypharmacy.  Patient is on chronic pain medications and also on high doses of Lyrica and Robaxin. - We will get ABG to rule out hypercarbia, CT head,  ammonia level. -Avoid giving IV narcotics or further sedative meds until his mental status improves.  3.  DM type II with neuropathy- continue Lantus, sliding scale insulin.   - BS stable and will cont. To monitor.   4.  History of previous CVA-continue Plavix, fenofibrate, Zetia, Lopid  5.  History of depression- continue Cymbalta.  6.  GERD- continue Protonix.  7.  Peripheral neuropathy- continue Lyrica.  8.  BPH- continue Flomax. - no urinary retention.   9. Chronic Pain - cont. Oxycontin.   Repeat COVID-19 test was (-).  Await Insurance approval for discharge to SNF. Discussed plan of care with Orthopedics.   All the records are reviewed and case discussed with Care Management/Social Worker. Management plans discussed with the patient, family and they are in agreement.  CODE STATUS: Full code  DVT Prophylaxis: Lovenox  TOTAL TIME TAKING CARE OF THIS PATIENT: 30 minutes.   POSSIBLE D/C IN 1-2 DAYS, DEPENDING ON CLINICAL CONDITION.   Henreitta Leber M.D on 02/05/2019 at 2:54 PM  Between 7am to 6pm - Pager - [phone]  After 6pm go to [email or web address] - password [email or web address] [facility]  Office  [phone]  CC: Primary care physician; Lavera Guise, MD

## 2019-02-05 NOTE — Progress Notes (Signed)
OT Cancellation Note  Patient Details Name: Victor Wise MRN: [id number] DOB: [birth year]   Cancelled Treatment:    Reason Eval/Treat Not Completed: Other (comment). Pt with MD upon attempt. Will re-attempt OT Tx at later date/time as pt is available and medically appropriate.   Jeni Salles, MPH, MS, OTR/L ascom [phone] 02/05/19, 3:07 PM

## 2019-02-05 NOTE — Progress Notes (Signed)
Inpatient Diabetes Program Recommendations  AACE/ADA: New Consensus Statement on Inpatient Glycemic Control (2015)  Target Ranges:  Prepandial:   less than 140 mg/dL      Peak postprandial:   less than 180 mg/dL (1-2 hours)      Critically ill patients:  140 - 180 mg/dL   Lab Results  Component Value Date   GLUCAP 246 (H) 02/04/2019   HGBA1C 8.7 (A) 01/28/2019    Review of Glycemic Control Results for Victor Wise, Victor Wise (MRN [id number]) as of 02/05/2019 12:13  Ref. Range 02/03/2019 21:04 02/04/2019 07:38 02/04/2019 11:46 02/04/2019 16:52 02/04/2019 21:12  Glucose-Capillary Latest Ref Range: 70 - 99 mg/dL 208 (H) 159 (H) 256 (H) 273 (H) 246 (H)   Inpatient Diabetes Program Recommendations:   -Add Novolog 5 units tid meal coverage if eats 50%  Thank you, Bethena Roys E. Christinea Brizuela, RN, MSN, CDE  Diabetes Coordinator Inpatient Glycemic Control Team Team Pager [phone] (8am-5pm) 02/05/2019 12:17 PM

## 2019-02-05 NOTE — Progress Notes (Addendum)
  Subjective:  POD #5 s/p ORIF left ankle.   Patient reports left ankle pain as mild to moderate.  Patient is drowsy but arousable. Patient has had a BM.    Objective:   VITALS:   Vitals:   02/04/19 1323 02/04/19 1648 02/04/19 2330 02/05/19 0818  BP:  127/66 129/68 (!) 146/98  Pulse: 60 (!) 59 61 (!) 58  Resp:      Temp:  98.5 F (36.9 C) 98.7 F (37.1 C)   TempSrc:  Oral Oral   SpO2: 97% 100% 100% 95%    PHYSICAL EXAM: Left lower extremity: Patient appears pale.  Responds to voice, but is lethargic today.  He does not appear to be in any acute distress. Neurovascular intact Sensation intact distally Incision: dressing C/D/I.  New ACE wrap applied over splint as previous ones were loose and beginning to unravel. Compartment soft  LABS  Results for orders placed or performed during the hospital encounter of 01/31/19 (from the past 24 hour(s))  Glucose, capillary     Status: Abnormal   Collection Time: 02/04/19  4:52 PM  Result Value Ref Range   Glucose-Capillary 273 (H) 70 - 99 mg/dL  Glucose, capillary     Status: Abnormal   Collection Time: 02/04/19  9:12 PM  Result Value Ref Range   Glucose-Capillary 246 (H) 70 - 99 mg/dL   Comment 1 Notify RN   Glucose, capillary     Status: Abnormal   Collection Time: 02/05/19  1:06 PM  Result Value Ref Range   Glucose-Capillary 183 (H) 70 - 99 mg/dL    No results found.  Assessment/Plan: 5 Days Post-Op   Active Problems:   Bimalleolar fracture of left ankle  CBC and BMP ordered to evaluate patient's pallor and lethargy.   Awaiting approval for [facility].  Continue PT.  Continue lovenox.  Continue left lower extremity elevation.    Thornton Park , MD 02/05/2019, 2:16 PM

## 2019-02-05 NOTE — Progress Notes (Signed)
Physical Therapy Treatment Patient Details Name: Victor Wise MRN: [id number] DOB: July 05, 1950 Today's Date: 02/05/2019    History of Present Illness Pt is a 68yo male s/p ankle ORIF for non-healing ankle fracture from a recent fall. PMHx includes pacemaker, CAD s/p stent, DM, depression, GERD, HTN, stroke, UTI, CHF, and MI.    PT Comments    Pt generally lethargic today.  Requesting bedpan for BM.  Pt is able to assist with moving up in bed with cues by reaching for headboard and using UE's to pull in boost bed position.  Rolling left for bedpan placement with mod a x 2 with verbal cues to reach for railing and assist.  Pt asking for extended time on pan after several minutes.  Pt left in nursing care and will cont as appropriate.   Follow Up Recommendations  SNF     Equipment Recommendations  None recommended by PT    Recommendations for Other Services       Precautions / Restrictions Precautions Precautions: Fall Restrictions Weight Bearing Restrictions: Yes LLE Weight Bearing: Non weight bearing    Mobility  Bed Mobility Overal bed mobility: Needs Assistance Bed Mobility: Rolling Rolling: Mod assist;+2 for physical assistance;+2 for safety/equipment            Transfers                    Ambulation/Gait                 Stairs             Wheelchair Mobility    Modified Rankin (Stroke Patients Only)       Balance                                            Cognition Arousal/Alertness: Lethargic Behavior During Therapy: WFL for tasks assessed/performed Overall Cognitive Status: Within Functional Limits for tasks assessed                                        Exercises Other Exercises Other Exercises: requested bedpan.  Placed with nurse tech    General Comments        Pertinent Vitals/Pain Pain Assessment: Faces Faces Pain Scale: Hurts even more Pain Location: L ankle Pain  Descriptors / Indicators: Aching;Constant;Grimacing Pain Intervention(s): Limited activity within patient's tolerance;Monitored during session    Home Living                      Prior Function            PT Goals (current goals can now be found in the care plan section) Progress towards PT goals: Not progressing toward goals - comment    Frequency    7X/week      PT Plan Current plan remains appropriate    Co-evaluation              AM-PAC PT "6 Clicks" Mobility   Outcome Measure  Help needed turning from your back to your side while in a flat bed without using bedrails?: Total Help needed moving from lying on your back to sitting on the side of a flat bed without using bedrails?: Total Help needed moving to and from a bed to a  chair (including a wheelchair)?: Total Help needed standing up from a chair using your arms (e.g., wheelchair or bedside chair)?: Total Help needed to walk in hospital room?: Total Help needed climbing 3-5 steps with a railing? : Total 6 Click Score: 6    End of Session   Activity Tolerance: Patient limited by fatigue;Patient limited by lethargy Patient left: in bed;with call bell/phone within reach;with bed alarm set;Other (comment);with nursing/sitter in room Nurse Communication: Mobility status       Time: 1010-1022 PT Time Calculation (min) (ACUTE ONLY): 12 min  Charges:  $Therapeutic Activity: 8-22 mins                    Chesley Noon, PTA 02/05/19, 10:49 AM

## 2019-02-06 LAB — GLUCOSE, CAPILLARY
Glucose-Capillary: 185 mg/dL — ABNORMAL HIGH (ref 70–99)
Glucose-Capillary: 283 mg/dL — ABNORMAL HIGH (ref 70–99)
Glucose-Capillary: 198 mg/dL — ABNORMAL HIGH (ref 70–99)
Glucose-Capillary: 263 mg/dL — ABNORMAL HIGH (ref 70–99)

## 2019-02-06 MED ORDER — TRAMADOL HCL 50 MG PO TABS: 50.0000 mg | ORAL_TABLET | Freq: Four times a day (QID) | ORAL | Status: DC | PRN

## 2019-02-06 NOTE — TOC Progression Note (Signed)
Transition of Care [facility]) - Progression Note    Patient Details  Name: Victor Wise MRN: [id number] Date of Birth: 12-10-1950  Transition of Care [facility]) CM/SW [place], RN Phone Number: 02/06/2019, 2:36 PM  Clinical Narrative:     I contacted Tina with [facility] to check on the status of the insurance authorization She called [employer] and stated that they are still processing and it is still pending   Expected Discharge Plan: [place] Barriers to Discharge: Continued Medical Work up  Expected Discharge Plan and Services Expected Discharge Plan: [place]   Discharge Planning Services: CM Consult   Living arrangements for the past 2 months: Single Family Home Expected Discharge Date: 02/01/19               DME Arranged: N/A         HH Arranged: PT, OT, Nurse's Aide HH Agency: [facility] Date [place] Agency Contacted: 02/01/19 Time [place]: [phone] Representative spoke with at [place]: [place] ([place]) Interventions    Readmission Risk Interventions No flowsheet data found.

## 2019-02-06 NOTE — Progress Notes (Signed)
[place] at [place] NAME: Victor Wise    MR#:  [id number]  DATE OF BIRTH:  1951-05-03  SUBJECTIVE:   Patient is status post repair of a bimalleolar fracture of the left ankle postop day #6 today.    Pt is more awake and oriented today.  No acute events overnight.    REVIEW OF SYSTEMS:    Review of Systems  Constitutional: Negative for chills and fever.  HENT: Negative for congestion and tinnitus.   Eyes: Negative for blurred vision and double vision.  Respiratory: Negative for cough, shortness of breath and wheezing.   Cardiovascular: Negative for chest pain, orthopnea and PND.  Gastrointestinal: Negative for abdominal pain, diarrhea, nausea and vomiting.  Genitourinary: Negative for dysuria and hematuria.  Musculoskeletal: Positive for falls and joint pain (Left Ankle).  Neurological: Negative for dizziness, sensory change and focal weakness.  All other systems reviewed and are negative.   Nutrition: Regular Tolerating Diet: Yes Tolerating PT: Eval noted.    DRUG ALLERGIES:   Allergies  Allergen Reactions  . Amoxicillin Other (See Comments)    Has never taken amoxicillin -ENT told him not to take (? Had skin test) Has patient had a PCN reaction causing immediate rash, facial/tongue/throat swelling, SOB or lightheadedness with hypotension: No Has patient had a PCN reaction causing severe rash involving mucus membranes or skin necrosis: No Has patient had a PCN reaction that required hospitalization No Has patient had a PCN reaction occurring within the last 10 years: No If above answers are "NO", then may proceed w/ Cephalo  . Other Anaphylaxis, Itching and Other (See Comments)    Pt states that he is allergic to Endopa.  Reaction:  Anaphylaxis  Pt states that he is allergic to Metabisulfites Reaction:  Itching   . Penicillins Other (See Comments)    Arm turned "blue" at injection site (no treatment needed) Has patient had  a PCN reaction causing immediate rash, facial/tongue/throat swelling, SOB or lightheadedness with hypotension: No Has patient had a PCN reaction causing severe rash involving mucus membranes or skin necrosis: No Has patient had a PCN reaction that required hospitalization No Has patient had a PCN reaction occurring within the last 10 years: No If all of the above answers are "NO", then may proceed with Cephalosporin use.  [name] Hydralazine Other (See Comments)    Reaction:  Cramping of extremities Pt reports tolerating low dose 12.33m but no higher.  . Cephalexin Itching  . Clindamycin Itching  . Crestor [Rosuvastatin Calcium] Other (See Comments)    Reaction:  Pt is unable to move arms/legs   . Metoprolol Nausea And Vomiting  . Red Dye Itching  . Statins Other (See Comments)    Reaction:  Pt is unable to move arms/legs  . Sulfa Antibiotics Itching    Other reaction(s): Unknown  . Yellow Dyes (Non-Tartrazine) Itching  . Aspirin Itching and Other (See Comments)    Pt states that he is able to use in lower doses.    . Tape Rash    Please use "paper" tape only. Please use "paper" tape only.    VITALS:  Blood pressure 111/83, pulse 63, temperature 98.9 F (37.2 C), temperature source Oral, resp. rate 17, SpO2 100 %.  PHYSICAL EXAMINATION:   Physical Exam  GENERAL:  68y.o.-year-old obese patient lying in bed eating breakfast but in NAD.  EYES: Pupils equal, round, reactive to light and accommodation. No scleral icterus. Extraocular muscles  intact.  HEENT: Head atraumatic, normocephalic. Oropharynx and nasopharynx clear.  NECK:  Supple, no jugular venous distention. No thyroid enlargement, no tenderness.  LUNGS: Normal breath sounds bilaterally, no wheezing, rales, rhonchi. No use of accessory muscles of respiration.  CARDIOVASCULAR: S1, S2 normal. No murmurs, rubs, or gallops.  ABDOMEN: Soft, nontender, nondistended. Bowel sounds present. No organomegaly or mass.  EXTREMITIES: No  cyanosis, clubbing or edema b/l.  Left foot/ankle in cast/boot.   NEUROLOGIC: Cranial nerves II through XII are intact. No focal Motor or sensory deficits b/l. Globally weak PSYCHIATRIC: The patient is alert and oriented x 3.  SKIN: No obvious rash, lesion, or ulcer.    LABORATORY PANEL:   CBC Recent Labs  Lab 02/05/19 1506  WBC 4.5  HGB 9.4*  HCT 29.2*  PLT 164   ------------------------------------------------------------------------------------------------------------------  Chemistries  Recent Labs  Lab 02/05/19 1506  NA 142  K 4.0  CL 106  CO2 26  GLUCOSE 189*  BUN 21  CREATININE 1.20  CALCIUM 9.2   ------------------------------------------------------------------------------------------------------------------  Cardiac Enzymes No results for input(s): TROPONINI in the last 168 hours. ------------------------------------------------------------------------------------------------------------------  RADIOLOGY:  Ct Head Wo Contrast  Result Date: 02/05/2019 CLINICAL DATA:  Altered mental status, confusion EXAM: CT HEAD WITHOUT CONTRAST TECHNIQUE: Contiguous axial images were obtained from the base of the skull through the vertex without intravenous contrast. COMPARISON:  10/06/2018 FINDINGS: Brain: No evidence of acute infarction, hemorrhage, hydrocephalus, extra-axial collection or mass lesion/mass effect. Encephalomalacia from left basal ganglia lacunar infarct. Stable ventricular size and configuration. Vascular: Mild atherosclerotic calcifications involving the large vessels of the skull base. No unexpected hyperdense vessel. Skull: Normal. Negative for fracture or focal lesion. Sinuses/Orbits: No acute finding. Other: None. IMPRESSION: No acute intracranial findings.  Stable exam from 10/06/2018. Electronically Signed   By: Davina Poke M.D.   On: 02/05/2019 16:14     ASSESSMENT AND PLAN:   68 year old male with past medical history of diabetes, GERD,  hypertension, previous MI, morbid obesity, obstructive sleep apnea, history of previous CVA, BPH, chronic back pain who presented to the hospital after a fall and noted to have a left ankle fracture.  1.  Status post fall left ankle fracture-patient is status post repair of a bimalleolar left ankle fracture.  Postop day #6 today. -Patient is on chronic pain meds at home.  Continue OxyContin, oxycodone as needed.  -Continue PT as tolerated - cont Lovenox for DVT prophylaxis.   2.  Altered mental status/encephalopathy-patient was a bit lethargic and confused yesterday.  - this was polypharmacy ?? Depression.  Pt is alert and oriented today.  - CT head was (-) yesterday, Ammonia level normal and ABD did not show any hypercapnia.  - CBC and met B looked o.k.  - avoid IV Narcotics.  Follow mental status which is much improved. ?? Referral to Psych as outpatient.   3.  DM type II with neuropathy- continue Lantus, sliding scale insulin.   - BS stable and will cont. To monitor.   4.  History of previous CVA-continue Plavix, fenofibrate, Zetia, Lopid  5.  History of depression- continue Cymbalta.  6.  GERD- continue Protonix.  7.  Peripheral neuropathy- continue Lyrica.  8.  BPH- continue Flomax. - no urinary retention.   9. Chronic Pain - cont. Oxycontin.   Awaiting Insurance authorization for discharge to SNF. Discussed with Dr. Mack Guise and will sign off and please call back if any further help needed. Stable for discharge from medical standpoint.   All the records  are reviewed and case discussed with Care Management/Social Worker. Management plans discussed with the patient, family and they are in agreement.  CODE STATUS: Full code  DVT Prophylaxis: Lovenox  TOTAL TIME TAKING CARE OF THIS PATIENT: 30 minutes.   POSSIBLE D/C IN 1-2 DAYS, DEPENDING ON CLINICAL CONDITION.   Henreitta Leber M.D on 02/06/2019 at 11:30 AM  Between 7am to 6pm - Pager - [phone]  After 6pm go to  [email or web address] - password [email or web address] [place] [facility]  Office  [phone]  CC: Primary care physician; Lavera Guise, MD

## 2019-02-06 NOTE — Plan of Care (Signed)
Pt remains incontinent of urine and stool. Pt flat affect with known depression. Pt denies suicidal thoughts. Pt improved today with eating breakfast and lunch. Blood Sugars still need coverage. Awaiting authorization to [facility].

## 2019-02-06 NOTE — TOC Progression Note (Signed)
Transition of Care [facility]) - Progression Note    Patient Details  Name: JERRID CABBAGE MRN: [id number] Date of Birth: [birth year]  Transition of Care [facility]) CM/SW [place], RN Phone Number: 02/06/2019, 10:16 AM  Clinical Narrative:    The patient's daughter called for an update.  She stated that her dad called her and said that he had an MRI but no idea why. I explained to her that the patient had been more drowsy than before and that could have indicated a need to rule out anything.  I encouraged her to call and speak to his bedside nurse or the physician.  I did explain that we are still awaiting insurance authorization and that additional information had to be sent yesterday, I do anticipate getting an answer from insurance today.  She stated she would touch base with the bedside nurse as well   Expected Discharge Plan: [place] Barriers to Discharge: Continued Medical Work up  Expected Discharge Plan and Services Expected Discharge Plan: [place]   Discharge Planning Services: CM Consult   Living arrangements for the past 2 months: Single Family Home Expected Discharge Date: 02/01/19               DME Arranged: N/A         HH Arranged: PT, OT, Nurse's Aide HH Agency: [facility] Date [place] Agency Contacted: 02/01/19 Time [place]: [phone] Representative spoke with at [place]: [place] ([place]) Interventions    Readmission Risk Interventions No flowsheet data found.

## 2019-02-06 NOTE — Progress Notes (Signed)
Subjective:  POD #6 s/p ORIF left ankle fracture.   Patient reports left ankle pain as mild to moderate.    Objective:   VITALS:   Vitals:   02/05/19 0818 02/05/19 1641 02/06/19 0805 02/06/19 1039  BP: (!) 146/98 127/64 111/83   Pulse: (!) 58 (!) 58 62 63  Resp:   17   Temp:  99.5 F (37.5 C) 98.9 F (37.2 C)   TempSrc:  Oral Oral   SpO2: 95% 98% 100% 100%    PHYSICAL EXAM: Left lower extremity: Neurovascular intact Sensation intact distally AO splint is still in place  LABS  Results for orders placed or performed during the hospital encounter of 01/31/19 (from the past 24 hour(s))  Blood gas, arterial     Status: Abnormal   Collection Time: 02/05/19  2:50 PM  Result Value Ref Range   FIO2 0.21    pH, Arterial 7.51 (H) 7.350 - 7.450   pCO2 arterial 34 32.0 - 48.0 mmHg   pO2, Arterial 74 (L) 83.0 - 108.0 mmHg   Bicarbonate 27.1 20.0 - 28.0 mmol/L   Acid-Base Excess 4.3 (H) 0.0 - 2.0 mmol/L   O2 Saturation 96.1 %   Patient temperature 37.0    Collection site RIGHT RADIAL    Sample type ARTERIAL DRAW    Allens test (pass/fail) POSITIVE (A) PASS  CBC     Status: Abnormal   Collection Time: 02/05/19  3:06 PM  Result Value Ref Range   WBC 4.5 4.0 - 10.5 K/uL   RBC 3.53 (L) 4.22 - 5.81 MIL/uL   Hemoglobin 9.4 (L) 13.0 - 17.0 g/dL   HCT 29.2 (L) 39.0 - 52.0 %   MCV 82.7 80.0 - 100.0 fL   MCH 26.6 26.0 - 34.0 pg   MCHC 32.2 30.0 - 36.0 g/dL   RDW 16.8 (H) 11.5 - 15.5 %   Platelets 164 150 - 400 K/uL   nRBC 0.0 0.0 - 0.2 %  Basic metabolic panel     Status: Abnormal   Collection Time: 02/05/19  3:06 PM  Result Value Ref Range   Sodium 142 135 - 145 mmol/L   Potassium 4.0 3.5 - 5.1 mmol/L   Chloride 106 98 - 111 mmol/L   CO2 26 22 - 32 mmol/L   Glucose, Bld 189 (H) 70 - 99 mg/dL   BUN 21 8 - 23 mg/dL   Creatinine, Ser 1.20 0.61 - 1.24 mg/dL   Calcium 9.2 8.9 - 10.3 mg/dL   GFR calc non Af Amer >60 >60 mL/min   GFR calc Af Amer >60 >60 mL/min   Anion gap 10 5  - 15  Ammonia     Status: None   Collection Time: 02/05/19  3:06 PM  Result Value Ref Range   Ammonia 31 9 - 35 umol/L  Glucose, capillary     Status: Abnormal   Collection Time: 02/05/19  4:38 PM  Result Value Ref Range   Glucose-Capillary 150 (H) 70 - 99 mg/dL  Glucose, capillary     Status: Abnormal   Collection Time: 02/05/19  9:08 PM  Result Value Ref Range   Glucose-Capillary 135 (H) 70 - 99 mg/dL  Glucose, capillary     Status: Abnormal   Collection Time: 02/06/19  8:04 AM  Result Value Ref Range   Glucose-Capillary 185 (H) 70 - 99 mg/dL   Comment 1 Notify RN   Glucose, capillary     Status: Abnormal   Collection Time: 02/06/19 11:34 AM  Result Value Ref Range   Glucose-Capillary 283 (H) 70 - 99 mg/dL   Comment 1 Notify RN     Ct Head Wo Contrast  Result Date: 02/05/2019 CLINICAL DATA:  Altered mental status, confusion EXAM: CT HEAD WITHOUT CONTRAST TECHNIQUE: Contiguous axial images were obtained from the base of the skull through the vertex without intravenous contrast. COMPARISON:  10/06/2018 FINDINGS: Brain: No evidence of acute infarction, hemorrhage, hydrocephalus, extra-axial collection or mass lesion/mass effect. Encephalomalacia from left basal ganglia lacunar infarct. Stable ventricular size and configuration. Vascular: Mild atherosclerotic calcifications involving the large vessels of the skull base. No unexpected hyperdense vessel. Skull: Normal. Negative for fracture or focal lesion. Sinuses/Orbits: No acute finding. Other: None. IMPRESSION: No acute intracranial findings.  Stable exam from 10/06/2018. Electronically Signed   By: Davina Poke M.D.   On: 02/05/2019 16:14    Assessment/Plan: 6 Days Post-Op   Active Problems:   Bimalleolar fracture of left ankle  Patient's pain is improving.  Continue PT.  Awaiting for insurance approval to go to SNF.    Thornton Park , MD 02/06/2019, 2:30 PM

## 2019-02-06 NOTE — Progress Notes (Signed)
Physical Therapy Treatment Patient Details Name: Victor Wise MRN: [id number] DOB: 10-17-1950 Today's Date: 02/06/2019    History of Present Illness Pt is a 68yo male s/p ankle ORIF for non-healing ankle fracture from a recent fall. PMHx includes pacemaker, CAD s/p stent, DM, depression, GERD, HTN, stroke, UTI, CHF, and MI.    PT Comments    Pt agreeable to PT; reports 9/10 pain in LLE/ankle. Assisted nsg staff with rolling pt for bed change/personal hygiene/gown change; pt requires Mod A of 2 for rolling and maintaining sidelying. Pt participates in supine exercises post bed mobility. Encouraged continued increase in repetitions and strength activities. Pt able to progress to 2 sets of 10 or 20 continuous repetitions as well as add some light resistance RLE for some exercises. Continue PT to progress strength and endurance to return to baseline functional mobility.   Follow Up Recommendations  SNF     Equipment Recommendations  None recommended by PT    Recommendations for Other Services       Precautions / Restrictions Precautions Precautions: Fall Restrictions Weight Bearing Restrictions: Yes LLE Weight Bearing: Non weight bearing    Mobility  Bed Mobility Overal bed mobility: Needs Assistance Bed Mobility: Rolling Rolling: Mod assist;+2 for physical assistance         General bed mobility comments: +2 for rolling/hold in SL and another for personal hygiene/care  Transfers                    Ambulation/Gait                 Stairs             Wheelchair Mobility    Modified Rankin (Stroke Patients Only)       Balance                                            Cognition Arousal/Alertness: Awake/alert Behavior During Therapy: WFL for tasks assessed/performed Overall Cognitive Status: Within Functional Limits for tasks assessed                                        Exercises General Exercises -  Lower Extremity Ankle Circles/Pumps: AROM;Right;20 reps Quad Sets: Strengthening;Both;20 reps Gluteal Sets: Strengthening;Both;20 reps Heel Slides: AAROM;Left;20 reps(AROM/RROM R x 20) Hip ABduction/ADduction: AAROM;20 reps;Left(Min A to hold R up/Resist to hip abd/add; 2 x 10) Straight Leg Raises: AAROM;Left;10 reps(Strength/AA R; B 2 x 10)    General Comments        Pertinent Vitals/Pain Pain Assessment: 0-10 Pain Score: 9  Pain Location: L ankle Pain Descriptors / Indicators: Constant;Grimacing;Aching;Operative site guarding;Shooting Pain Intervention(s): Patient requesting pain meds-RN notified    Home Living                      Prior Function            PT Goals (current goals can now be found in the care plan section) Progress towards PT goals: Progressing toward goals    Frequency    7X/week      PT Plan Current plan remains appropriate    Co-evaluation              AM-PAC PT "6 Clicks" Mobility   Outcome Measure  Help needed turning from your back to your side while in a flat bed without using bedrails?: Total Help needed moving from lying on your back to sitting on the side of a flat bed without using bedrails?: Total Help needed moving to and from a bed to a chair (including a wheelchair)?: Total Help needed standing up from a chair using your arms (e.g., wheelchair or bedside chair)?: Total Help needed to walk in hospital room?: Total Help needed climbing 3-5 steps with a railing? : Total 6 Click Score: 6    End of Session   Activity Tolerance: Patient limited by pain Patient left: in bed;with call bell/phone within reach;with bed alarm set Nurse Communication: Patient requests pain meds PT Visit Diagnosis: Difficulty in walking, not elsewhere classified (R26.2);History of falling (Z91.81);Dizziness and giddiness (R42)     Time: [id number] PT Time Calculation (min) (ACUTE ONLY): 31 min  Charges:  $Therapeutic Exercise: 8-22  mins $Therapeutic Activity: 8-22 mins                      Larae Grooms, PTA 02/06/2019, 11:18 AM

## 2019-02-06 NOTE — Care Management Important Message (Signed)
Important Message  Patient Details  Name: Victor Wise MRN: [id number] Date of Birth: 1950-06-15   Medicare Important Message Given:  Yes     Su Hilt, RN 02/06/2019, 11:50 AM

## 2019-02-07 DIAGNOSIS — G464 Cerebellar stroke syndrome: Secondary | ICD-10-CM | POA: Diagnosis not present

## 2019-02-07 DIAGNOSIS — F1110 Opioid abuse, uncomplicated: Secondary | ICD-10-CM | POA: Diagnosis not present

## 2019-02-07 DIAGNOSIS — M2550 Pain in unspecified joint: Secondary | ICD-10-CM | POA: Diagnosis not present

## 2019-02-07 DIAGNOSIS — T8149XA Infection following a procedure, other surgical site, initial encounter: Secondary | ICD-10-CM | POA: Diagnosis not present

## 2019-02-07 DIAGNOSIS — R531 Weakness: Secondary | ICD-10-CM | POA: Diagnosis not present

## 2019-02-07 DIAGNOSIS — N400 Enlarged prostate without lower urinary tract symptoms: Secondary | ICD-10-CM | POA: Diagnosis present

## 2019-02-07 DIAGNOSIS — E7800 Pure hypercholesterolemia, unspecified: Secondary | ICD-10-CM | POA: Diagnosis not present

## 2019-02-07 DIAGNOSIS — R5381 Other malaise: Secondary | ICD-10-CM | POA: Diagnosis not present

## 2019-02-07 DIAGNOSIS — X509XXA Other and unspecified overexertion or strenuous movements or postures, initial encounter: Secondary | ICD-10-CM | POA: Diagnosis not present

## 2019-02-07 DIAGNOSIS — R262 Difficulty in walking, not elsewhere classified: Secondary | ICD-10-CM | POA: Diagnosis not present

## 2019-02-07 DIAGNOSIS — I2510 Atherosclerotic heart disease of native coronary artery without angina pectoris: Secondary | ICD-10-CM | POA: Diagnosis not present

## 2019-02-07 DIAGNOSIS — R7989 Other specified abnormal findings of blood chemistry: Secondary | ICD-10-CM | POA: Diagnosis not present

## 2019-02-07 DIAGNOSIS — Z87891 Personal history of nicotine dependence: Secondary | ICD-10-CM | POA: Diagnosis not present

## 2019-02-07 DIAGNOSIS — Z950 Presence of cardiac pacemaker: Secondary | ICD-10-CM | POA: Diagnosis not present

## 2019-02-07 DIAGNOSIS — N390 Urinary tract infection, site not specified: Secondary | ICD-10-CM | POA: Diagnosis not present

## 2019-02-07 DIAGNOSIS — Z01818 Encounter for other preprocedural examination: Secondary | ICD-10-CM | POA: Diagnosis not present

## 2019-02-07 DIAGNOSIS — Z9989 Dependence on other enabling machines and devices: Secondary | ICD-10-CM | POA: Diagnosis not present

## 2019-02-07 DIAGNOSIS — Z9889 Other specified postprocedural states: Secondary | ICD-10-CM | POA: Diagnosis not present

## 2019-02-07 DIAGNOSIS — T847XXA Infection and inflammatory reaction due to other internal orthopedic prosthetic devices, implants and grafts, initial encounter: Secondary | ICD-10-CM | POA: Diagnosis not present

## 2019-02-07 DIAGNOSIS — J99 Respiratory disorders in diseases classified elsewhere: Secondary | ICD-10-CM | POA: Diagnosis not present

## 2019-02-07 DIAGNOSIS — E11628 Type 2 diabetes mellitus with other skin complications: Secondary | ICD-10-CM | POA: Diagnosis not present

## 2019-02-07 DIAGNOSIS — R05 Cough: Secondary | ICD-10-CM | POA: Diagnosis not present

## 2019-02-07 DIAGNOSIS — E1169 Type 2 diabetes mellitus with other specified complication: Secondary | ICD-10-CM | POA: Diagnosis present

## 2019-02-07 DIAGNOSIS — L97409 Non-pressure chronic ulcer of unspecified heel and midfoot with unspecified severity: Secondary | ICD-10-CM | POA: Diagnosis present

## 2019-02-07 DIAGNOSIS — M7989 Other specified soft tissue disorders: Secondary | ICD-10-CM | POA: Diagnosis not present

## 2019-02-07 DIAGNOSIS — F331 Major depressive disorder, recurrent, moderate: Secondary | ICD-10-CM | POA: Diagnosis not present

## 2019-02-07 DIAGNOSIS — S82842D Displaced bimalleolar fracture of left lower leg, subsequent encounter for closed fracture with routine healing: Secondary | ICD-10-CM | POA: Diagnosis not present

## 2019-02-07 DIAGNOSIS — R52 Pain, unspecified: Secondary | ICD-10-CM | POA: Diagnosis not present

## 2019-02-07 DIAGNOSIS — D649 Anemia, unspecified: Secondary | ICD-10-CM | POA: Diagnosis not present

## 2019-02-07 DIAGNOSIS — Z1159 Encounter for screening for other viral diseases: Secondary | ICD-10-CM | POA: Diagnosis not present

## 2019-02-07 DIAGNOSIS — D62 Acute posthemorrhagic anemia: Secondary | ICD-10-CM | POA: Diagnosis not present

## 2019-02-07 DIAGNOSIS — I509 Heart failure, unspecified: Secondary | ICD-10-CM | POA: Diagnosis not present

## 2019-02-07 DIAGNOSIS — Z6841 Body Mass Index (BMI) 40.0 and over, adult: Secondary | ICD-10-CM | POA: Diagnosis not present

## 2019-02-07 DIAGNOSIS — Y792 Prosthetic and other implants, materials and accessory orthopedic devices associated with adverse incidents: Secondary | ICD-10-CM | POA: Diagnosis present

## 2019-02-07 DIAGNOSIS — Z4789 Encounter for other orthopedic aftercare: Secondary | ICD-10-CM | POA: Diagnosis not present

## 2019-02-07 DIAGNOSIS — K219 Gastro-esophageal reflux disease without esophagitis: Secondary | ICD-10-CM | POA: Diagnosis not present

## 2019-02-07 DIAGNOSIS — R600 Localized edema: Secondary | ICD-10-CM | POA: Diagnosis not present

## 2019-02-07 DIAGNOSIS — Z95828 Presence of other vascular implants and grafts: Secondary | ICD-10-CM | POA: Diagnosis not present

## 2019-02-07 DIAGNOSIS — R319 Hematuria, unspecified: Secondary | ICD-10-CM | POA: Diagnosis not present

## 2019-02-07 DIAGNOSIS — R8290 Unspecified abnormal findings in urine: Secondary | ICD-10-CM | POA: Diagnosis not present

## 2019-02-07 DIAGNOSIS — S42402D Unspecified fracture of lower end of left humerus, subsequent encounter for fracture with routine healing: Secondary | ICD-10-CM | POA: Diagnosis not present

## 2019-02-07 DIAGNOSIS — R197 Diarrhea, unspecified: Secondary | ICD-10-CM | POA: Diagnosis present

## 2019-02-07 DIAGNOSIS — Z87311 Personal history of (healed) other pathological fracture: Secondary | ICD-10-CM | POA: Diagnosis not present

## 2019-02-07 DIAGNOSIS — E1151 Type 2 diabetes mellitus with diabetic peripheral angiopathy without gangrene: Secondary | ICD-10-CM | POA: Diagnosis not present

## 2019-02-07 DIAGNOSIS — Z7401 Bed confinement status: Secondary | ICD-10-CM | POA: Diagnosis not present

## 2019-02-07 DIAGNOSIS — R279 Unspecified lack of coordination: Secondary | ICD-10-CM | POA: Diagnosis not present

## 2019-02-07 DIAGNOSIS — R404 Transient alteration of awareness: Secondary | ICD-10-CM | POA: Diagnosis not present

## 2019-02-07 DIAGNOSIS — Z9682 Presence of neurostimulator: Secondary | ICD-10-CM | POA: Diagnosis not present

## 2019-02-07 DIAGNOSIS — I517 Cardiomegaly: Secondary | ICD-10-CM | POA: Diagnosis not present

## 2019-02-07 DIAGNOSIS — S82832A Other fracture of upper and lower end of left fibula, initial encounter for closed fracture: Secondary | ICD-10-CM | POA: Diagnosis not present

## 2019-02-07 DIAGNOSIS — M79605 Pain in left leg: Secondary | ICD-10-CM | POA: Diagnosis not present

## 2019-02-07 DIAGNOSIS — R5383 Other fatigue: Secondary | ICD-10-CM | POA: Diagnosis not present

## 2019-02-07 DIAGNOSIS — I959 Hypotension, unspecified: Secondary | ICD-10-CM | POA: Diagnosis not present

## 2019-02-07 DIAGNOSIS — L89610 Pressure ulcer of right heel, unstageable: Secondary | ICD-10-CM | POA: Diagnosis not present

## 2019-02-07 DIAGNOSIS — Z96662 Presence of left artificial ankle joint: Secondary | ICD-10-CM | POA: Diagnosis not present

## 2019-02-07 DIAGNOSIS — M1711 Unilateral primary osteoarthritis, right knee: Secondary | ICD-10-CM | POA: Diagnosis not present

## 2019-02-07 DIAGNOSIS — Z8673 Personal history of transient ischemic attack (TIA), and cerebral infarction without residual deficits: Secondary | ICD-10-CM | POA: Diagnosis not present

## 2019-02-07 DIAGNOSIS — T8459XA Infection and inflammatory reaction due to other internal joint prosthesis, initial encounter: Secondary | ICD-10-CM | POA: Diagnosis not present

## 2019-02-07 DIAGNOSIS — M869 Osteomyelitis, unspecified: Secondary | ICD-10-CM | POA: Diagnosis present

## 2019-02-07 DIAGNOSIS — R509 Fever, unspecified: Secondary | ICD-10-CM | POA: Diagnosis not present

## 2019-02-07 DIAGNOSIS — E1159 Type 2 diabetes mellitus with other circulatory complications: Secondary | ICD-10-CM | POA: Diagnosis not present

## 2019-02-07 DIAGNOSIS — T8130XD Disruption of wound, unspecified, subsequent encounter: Secondary | ICD-10-CM | POA: Diagnosis not present

## 2019-02-07 DIAGNOSIS — Z7901 Long term (current) use of anticoagulants: Secondary | ICD-10-CM | POA: Diagnosis not present

## 2019-02-07 DIAGNOSIS — Z9689 Presence of other specified functional implants: Secondary | ICD-10-CM | POA: Diagnosis not present

## 2019-02-07 DIAGNOSIS — G8929 Other chronic pain: Secondary | ICD-10-CM | POA: Diagnosis not present

## 2019-02-07 DIAGNOSIS — Z880 Allergy status to penicillin: Secondary | ICD-10-CM | POA: Diagnosis not present

## 2019-02-07 DIAGNOSIS — Y9289 Other specified places as the place of occurrence of the external cause: Secondary | ICD-10-CM | POA: Diagnosis not present

## 2019-02-07 DIAGNOSIS — S99912A Unspecified injury of left ankle, initial encounter: Secondary | ICD-10-CM | POA: Diagnosis present

## 2019-02-07 DIAGNOSIS — Z8631 Personal history of diabetic foot ulcer: Secondary | ICD-10-CM | POA: Diagnosis not present

## 2019-02-07 DIAGNOSIS — E1165 Type 2 diabetes mellitus with hyperglycemia: Secondary | ICD-10-CM | POA: Diagnosis not present

## 2019-02-07 DIAGNOSIS — Z886 Allergy status to analgesic agent status: Secondary | ICD-10-CM | POA: Diagnosis not present

## 2019-02-07 DIAGNOSIS — G4733 Obstructive sleep apnea (adult) (pediatric): Secondary | ICD-10-CM | POA: Diagnosis not present

## 2019-02-07 DIAGNOSIS — R269 Unspecified abnormalities of gait and mobility: Secondary | ICD-10-CM | POA: Diagnosis not present

## 2019-02-07 DIAGNOSIS — Z89411 Acquired absence of right great toe: Secondary | ICD-10-CM | POA: Diagnosis not present

## 2019-02-07 DIAGNOSIS — Z89412 Acquired absence of left great toe: Secondary | ICD-10-CM | POA: Diagnosis not present

## 2019-02-07 DIAGNOSIS — E1142 Type 2 diabetes mellitus with diabetic polyneuropathy: Secondary | ICD-10-CM | POA: Diagnosis present

## 2019-02-07 DIAGNOSIS — F5101 Primary insomnia: Secondary | ICD-10-CM | POA: Diagnosis not present

## 2019-02-07 DIAGNOSIS — I739 Peripheral vascular disease, unspecified: Secondary | ICD-10-CM | POA: Diagnosis not present

## 2019-02-07 DIAGNOSIS — R29898 Other symptoms and signs involving the musculoskeletal system: Secondary | ICD-10-CM | POA: Diagnosis not present

## 2019-02-07 DIAGNOSIS — Z743 Need for continuous supervision: Secondary | ICD-10-CM | POA: Diagnosis not present

## 2019-02-07 DIAGNOSIS — Z794 Long term (current) use of insulin: Secondary | ICD-10-CM | POA: Diagnosis not present

## 2019-02-07 DIAGNOSIS — T8131XD Disruption of external operation (surgical) wound, not elsewhere classified, subsequent encounter: Secondary | ICD-10-CM | POA: Diagnosis not present

## 2019-02-07 DIAGNOSIS — M25572 Pain in left ankle and joints of left foot: Secondary | ICD-10-CM | POA: Diagnosis not present

## 2019-02-07 DIAGNOSIS — W19XXXA Unspecified fall, initial encounter: Secondary | ICD-10-CM | POA: Diagnosis not present

## 2019-02-07 DIAGNOSIS — R4020 Unspecified coma: Secondary | ICD-10-CM | POA: Diagnosis not present

## 2019-02-07 DIAGNOSIS — S82842A Displaced bimalleolar fracture of left lower leg, initial encounter for closed fracture: Secondary | ICD-10-CM | POA: Diagnosis not present

## 2019-02-07 DIAGNOSIS — R0902 Hypoxemia: Secondary | ICD-10-CM | POA: Diagnosis not present

## 2019-02-07 DIAGNOSIS — L98412 Non-pressure chronic ulcer of buttock with fat layer exposed: Secondary | ICD-10-CM | POA: Diagnosis not present

## 2019-02-07 DIAGNOSIS — L089 Local infection of the skin and subcutaneous tissue, unspecified: Secondary | ICD-10-CM | POA: Diagnosis not present

## 2019-02-07 DIAGNOSIS — E876 Hypokalemia: Secondary | ICD-10-CM | POA: Diagnosis not present

## 2019-02-07 DIAGNOSIS — I69354 Hemiplegia and hemiparesis following cerebral infarction affecting left non-dominant side: Secondary | ICD-10-CM | POA: Diagnosis not present

## 2019-02-07 DIAGNOSIS — M86172 Other acute osteomyelitis, left ankle and foot: Secondary | ICD-10-CM | POA: Diagnosis not present

## 2019-02-07 DIAGNOSIS — S81802A Unspecified open wound, left lower leg, initial encounter: Secondary | ICD-10-CM | POA: Diagnosis not present

## 2019-02-07 DIAGNOSIS — I5032 Chronic diastolic (congestive) heart failure: Secondary | ICD-10-CM | POA: Diagnosis not present

## 2019-02-07 DIAGNOSIS — Z881 Allergy status to other antibiotic agents status: Secondary | ICD-10-CM | POA: Diagnosis not present

## 2019-02-07 DIAGNOSIS — R0989 Other specified symptoms and signs involving the circulatory and respiratory systems: Secondary | ICD-10-CM | POA: Diagnosis not present

## 2019-02-07 DIAGNOSIS — M6281 Muscle weakness (generalized): Secondary | ICD-10-CM | POA: Diagnosis not present

## 2019-02-07 DIAGNOSIS — S82842P Displaced bimalleolar fracture of left lower leg, subsequent encounter for closed fracture with malunion: Secondary | ICD-10-CM | POA: Diagnosis not present

## 2019-02-07 DIAGNOSIS — L03116 Cellulitis of left lower limb: Secondary | ICD-10-CM | POA: Diagnosis not present

## 2019-02-07 DIAGNOSIS — Z7902 Long term (current) use of antithrombotics/antiplatelets: Secondary | ICD-10-CM | POA: Diagnosis not present

## 2019-02-07 DIAGNOSIS — I5022 Chronic systolic (congestive) heart failure: Secondary | ICD-10-CM | POA: Diagnosis not present

## 2019-02-07 DIAGNOSIS — M5489 Other dorsalgia: Secondary | ICD-10-CM | POA: Diagnosis not present

## 2019-02-07 DIAGNOSIS — Z20828 Contact with and (suspected) exposure to other viral communicable diseases: Secondary | ICD-10-CM | POA: Diagnosis not present

## 2019-02-07 DIAGNOSIS — Y9389 Activity, other specified: Secondary | ICD-10-CM | POA: Diagnosis not present

## 2019-02-07 DIAGNOSIS — T8132XA Disruption of internal operation (surgical) wound, not elsewhere classified, initial encounter: Secondary | ICD-10-CM | POA: Diagnosis not present

## 2019-02-07 DIAGNOSIS — E782 Mixed hyperlipidemia: Secondary | ICD-10-CM | POA: Diagnosis not present

## 2019-02-07 DIAGNOSIS — I2583 Coronary atherosclerosis due to lipid rich plaque: Secondary | ICD-10-CM | POA: Diagnosis present

## 2019-02-07 DIAGNOSIS — M86072 Acute hematogenous osteomyelitis, left ankle and foot: Secondary | ICD-10-CM | POA: Diagnosis not present

## 2019-02-07 DIAGNOSIS — Z79891 Long term (current) use of opiate analgesic: Secondary | ICD-10-CM | POA: Diagnosis not present

## 2019-02-07 DIAGNOSIS — B9561 Methicillin susceptible Staphylococcus aureus infection as the cause of diseases classified elsewhere: Secondary | ICD-10-CM | POA: Diagnosis not present

## 2019-02-07 DIAGNOSIS — Y999 Unspecified external cause status: Secondary | ICD-10-CM | POA: Diagnosis not present

## 2019-02-07 DIAGNOSIS — I110 Hypertensive heart disease with heart failure: Secondary | ICD-10-CM | POA: Diagnosis not present

## 2019-02-07 DIAGNOSIS — S82842G Displaced bimalleolar fracture of left lower leg, subsequent encounter for closed fracture with delayed healing: Secondary | ICD-10-CM | POA: Diagnosis not present

## 2019-02-07 DIAGNOSIS — Z79899 Other long term (current) drug therapy: Secondary | ICD-10-CM | POA: Diagnosis not present

## 2019-02-07 DIAGNOSIS — Z23 Encounter for immunization: Secondary | ICD-10-CM | POA: Diagnosis not present

## 2019-02-07 DIAGNOSIS — R6889 Other general symptoms and signs: Secondary | ICD-10-CM | POA: Diagnosis not present

## 2019-02-07 DIAGNOSIS — Z1623 Resistance to quinolones and fluoroquinolones: Secondary | ICD-10-CM | POA: Diagnosis not present

## 2019-02-07 DIAGNOSIS — I10 Essential (primary) hypertension: Secondary | ICD-10-CM | POA: Diagnosis not present

## 2019-02-07 DIAGNOSIS — E785 Hyperlipidemia, unspecified: Secondary | ICD-10-CM | POA: Diagnosis not present

## 2019-02-07 DIAGNOSIS — F329 Major depressive disorder, single episode, unspecified: Secondary | ICD-10-CM | POA: Diagnosis not present

## 2019-02-07 DIAGNOSIS — Z7982 Long term (current) use of aspirin: Secondary | ICD-10-CM | POA: Diagnosis not present

## 2019-02-07 DIAGNOSIS — E119 Type 2 diabetes mellitus without complications: Secondary | ICD-10-CM | POA: Diagnosis not present

## 2019-02-07 DIAGNOSIS — S93402A Sprain of unspecified ligament of left ankle, initial encounter: Secondary | ICD-10-CM | POA: Diagnosis not present

## 2019-02-07 DIAGNOSIS — T847XXD Infection and inflammatory reaction due to other internal orthopedic prosthetic devices, implants and grafts, subsequent encounter: Secondary | ICD-10-CM | POA: Diagnosis not present

## 2019-02-07 LAB — GLUCOSE, CAPILLARY
Glucose-Capillary: 210 mg/dL — ABNORMAL HIGH (ref 70–99)
Glucose-Capillary: 194 mg/dL — ABNORMAL HIGH (ref 70–99)

## 2019-02-07 LAB — SARS CORONAVIRUS 2 (TAT 6-24 HRS): SARS Coronavirus 2: NEGATIVE

## 2019-02-07 MED ORDER — ENOXAPARIN SODIUM 40 MG/0.4ML ~~LOC~~ SOLN: 40.0000 mg | Freq: Two times a day (BID) | SUBCUTANEOUS | Status: DC

## 2019-02-07 MED ORDER — OXYCODONE HCL 5 MG PO TABS: 5.0000 mg | ORAL_TABLET | ORAL | 0 refills | Status: DC | PRN

## 2019-02-07 MED ORDER — DOCUSATE SODIUM 100 MG PO CAPS: 100.0000 mg | ORAL_CAPSULE | Freq: Two times a day (BID) | ORAL | 0 refills | Status: DC

## 2019-02-07 MED ORDER — OXYCODONE HCL ER 20 MG PO T12A: 20.0000 mg | EXTENDED_RELEASE_TABLET | Freq: Two times a day (BID) | ORAL | 0 refills | Status: DC

## 2019-02-07 MED ORDER — ACETAMINOPHEN 325 MG PO TABS: 325.0000 mg | ORAL_TABLET | Freq: Four times a day (QID) | ORAL | 1 refills | Status: DC | PRN

## 2019-02-07 MED ORDER — BISACODYL 10 MG RE SUPP: 10.0000 mg | Freq: Every day | RECTAL | 0 refills | Status: DC | PRN

## 2019-02-07 MED ORDER — ENOXAPARIN SODIUM 40 MG/0.4ML ~~LOC~~ SOLN: 40.0000 mg | Freq: Two times a day (BID) | SUBCUTANEOUS | 0 refills | Status: DC

## 2019-02-07 NOTE — Progress Notes (Signed)
Progress/Discharge note: Writer informed pt of discharge plan per MD order to [facility], he verbalized understanding said he has been there numerous times.  Report was called to Maudie Mercury at [facility] [employer] and EMS called for nonemergent transport.  Patient was updated on EMS arrival time.

## 2019-02-07 NOTE — Progress Notes (Signed)
EMS was unable to transport pt's wheelchair to [facility]. This Probation officer notified pt's daughter [name] to pick up wheelchair. She will pick it up this evening.

## 2019-02-07 NOTE — Progress Notes (Signed)
  Subjective:  POD #7 s/p ORIF left ankle.   Patient reports left ankle pain as mild.    Objective:   VITALS:   Vitals:   02/06/19 1039 02/06/19 1637 02/06/19 2347 02/07/19 0743  BP:  100/60 118/66 (!) 124/55  Pulse: 63 (!) 59 61 (!) 59  Resp:  17 18   Temp:  98.1 F (36.7 C) 98.2 F (36.8 C) 98.1 F (36.7 C)  TempSrc:  Oral Oral Oral  SpO2: 100% 100% 97% 99%    PHYSICAL EXAM: Left lower extremity: Splint and dressing are clean and dry. Patient can flex and extend his lesser toes.  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 Lesser toes are well-perfused.  He has intact sensation light touch in his lesser toes.   LABS  Results for orders placed or performed during the hospital encounter of 01/31/19 (from the past 24 hour(s))  Glucose, capillary     Status: Abnormal   Collection Time: 02/06/19  4:39 PM  Result Value Ref Range   Glucose-Capillary 198 (H) 70 - 99 mg/dL   Comment 1 Notify RN   Glucose, capillary     Status: Abnormal   Collection Time: 02/06/19  9:04 PM  Result Value Ref Range   Glucose-Capillary 263 (H) 70 - 99 mg/dL  Glucose, capillary     Status: Abnormal   Collection Time: 02/07/19  7:44 AM  Result Value Ref Range   Glucose-Capillary 210 (H) 70 - 99 mg/dL  Glucose, capillary     Status: Abnormal   Collection Time: 02/07/19 11:35 AM  Result Value Ref Range   Glucose-Capillary 194 (H) 70 - 99 mg/dL   Comment 1 Notify RN     Ct Head Wo Contrast  Result Date: 02/05/2019 CLINICAL DATA:  Altered mental status, confusion EXAM: CT HEAD WITHOUT CONTRAST TECHNIQUE: Contiguous axial images were obtained from the base of the skull through the vertex without intravenous contrast. COMPARISON:  10/06/2018 FINDINGS: Brain: No evidence of acute infarction, hemorrhage, hydrocephalus, extra-axial collection or mass lesion/mass effect. Encephalomalacia from left basal ganglia lacunar infarct. Stable ventricular size and configuration. Vascular: Mild atherosclerotic calcifications  involving the large vessels of the skull base. No unexpected hyperdense vessel. Skull: Normal. Negative for fracture or focal lesion. Sinuses/Orbits: No acute finding. Other: None. IMPRESSION: No acute intracranial findings.  Stable exam from 10/06/2018. Electronically Signed   By: Davina Poke M.D.   On: 02/05/2019 16:14    Assessment/Plan: 7 Days Post-Op   Active Problems:   Bimalleolar fracture of left ankle  Patient is more alert today.  His pain is controlled.  He is ready for discharge to skilled nursing facility.  Patient will continue Lovenox 40 mg every 12 hours for DVT prophylaxis.  Patient will keep his splint and dressing on until follow-up in the office in approximately 7 to 10 days.  Patient should be nonweightbearing on the left lower extremity for a total of 6 weeks postop.  Continue to elevate the left lower extremity whenever possible.  Ice may be applied to the left ankle.  Continue physical therapy while at skilled nursing facility.    Thornton Park , MD 02/07/2019, 12:59 PM

## 2019-02-07 NOTE — TOC Progression Note (Signed)
Transition of Care [facility]) - Progression Note    Patient Details  Name: ZAMARION EVERT MRN: [id number] Date of Birth: 21-Apr-1951  Transition of Care [facility]) CM/SW [place], RN Phone Number: 02/07/2019, 9:45 AM  Clinical Narrative:    Otila Kluver with [facility] contyacted me that we now have insurance approval, The Covid results are from 02/04/19 at 0226 and they can't accept that one.  They will need a new Covid test.  I notified the physician    Expected Discharge Plan: [place] Barriers to Discharge: Continued Medical Work up  Expected Discharge Plan and Services Expected Discharge Plan: [place]   Discharge Planning Services: CM Consult   Living arrangements for the past 2 months: Single Family Home Expected Discharge Date: 02/01/19               DME Arranged: N/A         HH Arranged: PT, OT, Nurse's Aide HH Agency: [facility] Date [place] Agency Contacted: 02/01/19 Time [place]: [phone] Representative spoke with at [place]: [place] ([place]) Interventions    Readmission Risk Interventions No flowsheet data found.

## 2019-02-07 NOTE — Consult Note (Addendum)
PHARMACIST - Consult on Enoxaparin Dosing   CONCERNING:  Enoxaparin (Lovenox) for DVT Prophylaxis    RECOMMENDATION: Patient was prescribed enoxaprin 40mg  q24 hours for VTE prophylaxis.   There were no vitals filed for this visit.  There is no height or weight on file to calculate BMI. BMI 45.5 mg/m2  Estimated Creatinine Clearance: 80.5 mL/min (by C-G formula based on SCr of 1.2 mg/dL).   Based on [place] patient is candidate for enoxaparin 40mg  every 12 hour dosing due to BMI being >40.  Patient is POD #7 s/p repair of a bimalleolar left ankle fracture.   DESCRIPTION: Pharmacy has adjusted enoxaparin dose per [facility] policy.  Patient is now receiving enoxaparin 40mg  every 12 hours, as per discussion with MD.    [name], PharmD Clinical Pharmacist  02/07/2019 11:49 AM

## 2019-02-07 NOTE — TOC Transition Note (Signed)
Transition of Care [facility]) - CM/SW Discharge Note   Patient Details  Name: Victor Wise MRN: [id number] Date of Birth: 11/18/1950  Transition of Care [facility]) CM/SW Contact:  Su Hilt, RN Phone Number: 02/07/2019, 11:58 AM   Clinical Narrative:    Patient will DC to [facility] room [place] his daughter is aware Will transport via EMS Bedside Nurse to call report when ready to DC to [phone] and call EMS when ready to transport   Final next level of care: Skilled Nursing Facility Barriers to Discharge: Barriers Resolved   Patient Goals and CMS Choice Patient states their goals for this hospitalization and ongoing recovery are:: Go home      Discharge Placement                       Discharge Plan and Services   Discharge Planning Services: CM Consult            DME Arranged: N/A         HH Arranged: PT, OT, Nurse's Aide HH Agency: [facility] Date [facility] Agency Contacted: 02/01/19 Time [place]: [phone] Representative spoke with at [place]: [place] ([place]) Interventions     Readmission Risk Interventions No flowsheet data found.

## 2019-02-07 NOTE — Progress Notes (Signed)
Physical Therapy Treatment Patient Details Name: Victor Wise MRN: [id number] DOB: [birth year] Today's Date: 02/07/2019    History of Present Illness 68yo male s/p ankle ORIF for non-healing ankle fracture from a recent fall. PMHx includes pacemaker, CAD s/p stent, DM, depression, GERD, HTN, stroke, UTI, CHF, and MI.    PT Comments    Pt was able to participate with in bed exercises for b/l LEs but deferred getting up to sitting in recliner/wheel chair, or even EOB.  He was able to do some exercises with AROM or even light assist but was guarded and c/o pain with most tasks.  Pt knows that he will need to be doing more at rehab and simply states he is too sore and hurting at this point and doesn't want to push mobility despite multiple attempts to encourage any mobility from PT t/o the session.    Follow Up Recommendations  SNF(apparently auth recieved but now Covid testing "expired")     Equipment Recommendations  None recommended by PT    Recommendations for Other Services       Precautions / Restrictions Precautions Precautions: Fall Restrictions LLE Weight Bearing: Non weight bearing    Mobility  Bed Mobility               General bed mobility comments: pt deferred mobility today, offered/encouraged numerous times t/o session  Transfers                    Ambulation/Gait                 Stairs             Wheelchair Mobility    Modified Rankin (Stroke Patients Only)       Balance                                            Cognition Arousal/Alertness: Awake/alert Behavior During Therapy: WFL for tasks assessed/performed Overall Cognitive Status: Within Functional Limits for tasks assessed                                        Exercises General Exercises - Lower Extremity Ankle Circles/Pumps: AROM;Right;20 reps Quad Sets: Strengthening;Both;20 reps Short Arc Quad: Strengthening;Right;10  reps(attempted on L, pt c/o too much pain with position) Heel Slides: AROM;15 reps;Both Hip ABduction/ADduction: AROM;15 reps;Both Straight Leg Raises: AAROM;10 reps;Both;AROM    General Comments        Pertinent Vitals/Pain Pain Assessment: 0-10 Pain Score: 6 (pt c/o pain in both R knee and L ankle with any movement)    Home Living                      Prior Function            PT Goals (current goals can now be found in the care plan section) Progress towards PT goals: Not progressing toward goals - comment(minimal motivation to do mobility tasks thus far)    Frequency    7X/week      PT Plan Current plan remains appropriate    Co-evaluation              AM-PAC PT "6 Clicks" Mobility   Outcome Measure  Help needed turning from your back  to your side while in a flat bed without using bedrails?: Total Help needed moving from lying on your back to sitting on the side of a flat bed without using bedrails?: Total Help needed moving to and from a bed to a chair (including a wheelchair)?: Total Help needed standing up from a chair using your arms (e.g., wheelchair or bedside chair)?: Total Help needed to walk in hospital room?: Total Help needed climbing 3-5 steps with a railing? : Total 6 Click Score: 6    End of Session   Activity Tolerance: Patient limited by pain Patient left: in bed;with call bell/phone within reach;with bed alarm set   PT Visit Diagnosis: Difficulty in walking, not elsewhere classified (R26.2);History of falling (Z91.81);Dizziness and giddiness (R42)     Time: [id number] PT Time Calculation (min) (ACUTE ONLY): 25 min  Charges:  $Therapeutic Exercise: 23-37 mins                     Kreg Shropshire, DPT 02/07/2019, 11:16 AM

## 2019-02-07 NOTE — Discharge Summary (Signed)
Physician Discharge Summary  Patient ID: Victor Wise MRN: [id number] DOB/AGE: [birth year] 68 y.o.  Admit date: 01/31/2019 Discharge date: 02/07/2019  Admission Diagnoses:  S82.842G displaced bimalleolar fracture of left lower leg closed fracture with delayed healing <principal problem not specified>  Discharge Diagnoses:  S82.842G displaced bimalleolar fracture of left lower leg closed fracture with delayed healing Active Problems:   Bimalleolar fracture of left ankle   Past Medical History:  Diagnosis Date  . Allergy   . Basal cell carcinoma    forehead  . BPH (benign prostatic hyperplasia)   . CHF (congestive heart failure) ([place])   . Chronic back pain   . Coronary artery disease   . Depression   . Diabetes ([place])   . GERD (gastroesophageal reflux disease)   . Heart attack ([place])    2018  . Hypertension   . Morbid obesity ([place])   . Obstructive sleep apnea   . 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 Status post insertion of spinal cord stimulator   . Stroke [facility])    last one 2-3 years ago  . UTI (lower urinary tract infection)     Surgeries: Procedure(s): open reduction internal fixation left bimalleolar ankle fracture on 01/31/2019   Consultants (if any): Treatment Team:  Vaughan Basta, MD  Discharged Condition: Improved  Hospital Course: Victor Wise is an 68 y.o. male who was admitted 01/31/2019 with a diagnosis of a displaced bimalleolar fracture of left lower leg closed fracture and went to the operating room on 01/31/2019 and underwentORIF of the left ankle.    He was given perioperative antibiotics:  Anti-infectives (From admission, onward)   Start     Dose/Rate Route Frequency Ordered Stop   02/01/19 0000  vancomycin (VANCOCIN) IVPB 1000 mg/200 mL premix     1,000 mg 200 mL/hr over 60 Minutes Intravenous Every 12 hours 01/31/19 1801 02/01/19 1159   01/31/19 0600  vancomycin (VANCOCIN) 1,500 mg in sodium chloride 0.9 % 500 mL IVPB     1,500 mg 250 mL/hr  over 120 Minutes Intravenous On call to O.R. 01/30/19 2228 01/31/19 1346    .  He was given sequential compression devices, early ambulation, and lovenox for DVT prophylaxis.  Patient had significant postoperative pain given his chronic pain.  Pain improved throughout his hospitalization.  Patient received physical therapy.  He was consulted by the hospitalist service for assistance with diabetes management and hypertension.  Patient remained stable throughout his hospitalization.  Given his clinical improvement he is prepared for discharge on postop day #7.  Part of the patient's hospital stay was waiting for insurance approval for skilled nursing facility.  He benefited maximally from the hospital stay and there were no complications.    Recent vital signs:  Vitals:   02/06/19 2347 02/07/19 0743  BP: 118/66 (!) 124/55  Pulse: 61 (!) 59  Resp: 18   Temp: 98.2 F (36.8 C) 98.1 F (36.7 C)  SpO2: 97% 99%    Recent laboratory studies:  Lab Results  Component Value Date   HGB 9.4 (L) 02/05/2019   HGB 8.6 (L) 02/03/2019   HGB 8.7 (L) 02/02/2019   Lab Results  Component Value Date   WBC 4.5 02/05/2019   PLT 164 02/05/2019   Lab Results  Component Value Date   INR 0.9 01/31/2019   Lab Results  Component Value Date   NA 142 02/05/2019   K 4.0 02/05/2019   CL 106 02/05/2019   CO2 26 02/05/2019  BUN 21 02/05/2019   CREATININE 1.20 02/05/2019   GLUCOSE 189 (H) 02/05/2019    Discharge Medications:   Allergies as of 02/07/2019      Reactions   Amoxicillin Other (See Comments)   Has never taken amoxicillin -ENT told him not to take (? Had skin test) Has patient had a PCN reaction causing immediate rash, facial/tongue/throat swelling, SOB or lightheadedness with hypotension: No Has patient had a PCN reaction causing severe rash involving mucus membranes or skin necrosis: No Has patient had a PCN reaction that required hospitalization No Has patient had a PCN reaction  occurring within the last 10 years: No If above answers are "NO", then may proceed w/ Cephalo   Other Anaphylaxis, Itching, Other (See Comments)   Pt states that he is allergic to Endopa.  Reaction:  Anaphylaxis  Pt states that he is allergic to Metabisulfites Reaction:  Itching    Penicillins Other (See Comments)   Arm turned "blue" at injection site (no treatment needed) Has patient had a PCN reaction causing immediate rash, facial/tongue/throat swelling, SOB or lightheadedness with hypotension: No Has patient had a PCN reaction causing severe rash involving mucus membranes or skin necrosis: No Has patient had a PCN reaction that required hospitalization No Has patient had a PCN reaction occurring within the last 10 years: No If all of the above answers are "NO", then may proceed with Cephalosporin use.   Hydralazine Other (See Comments)   Reaction:  Cramping of extremities Pt reports tolerating low dose 12.75m but no higher.   Cephalexin Itching   Clindamycin Itching   Crestor [rosuvastatin Calcium] Other (See Comments)   Reaction:  Pt is unable to move arms/legs    Metoprolol Nausea And Vomiting   Red Dye Itching   Statins Other (See Comments)   Reaction:  Pt is unable to move arms/legs   Sulfa Antibiotics Itching   Other reaction(s): Unknown   Yellow Dyes (non-tartrazine) Itching   Aspirin Itching, Other (See Comments)   Pt states that he is able to use in lower doses.     Tape Rash   Please use "paper" tape only. Please use "paper" tape only.      Medication List    STOP taking these medications   nitroGLYCERIN 0.4 MG SL tablet Commonly known as: NITROSTAT     TAKE these medications   acetaminophen 325 MG tablet Commonly known as: TYLENOL Take 1-2 tablets (325-650 mg total) by mouth every 6 (six) hours as needed for mild pain (pain score 1-3 or temp > 100.5). What changed:   medication strength  how much to take  when to take this  reasons to take this    amLODipine 10 MG tablet Commonly known as: NORVASC TAKE 1 TABLET BY MOUTH ONCE DAILY   aspirin EC 81 MG tablet Take 81 mg by mouth daily.   benazepril 20 MG tablet Commonly known as: LOTENSIN Take 1 tablet (20 mg total) by mouth 2 (two) times daily.   bisacodyl 10 MG suppository Commonly known as: DULCOLAX Place 1 suppository (10 mg total) rectally daily as needed for moderate constipation.   Bydureon 2 MG Pen Generic drug: Exenatide ER Inject 2 mg into the skin once a week.   cetirizine 10 MG tablet Commonly known as: ZYRTEC Take 10 mg by mouth daily as needed for allergies.   clopidogrel 75 MG tablet Commonly known as: PLAVIX Take 1 tablet (75 mg total) by mouth daily.   docusate sodium 100 MG capsule  Commonly known as: COLACE Take 1 capsule (100 mg total) by mouth 2 (two) times daily.   DULoxetine 30 MG capsule Commonly known as: CYMBALTA Take 30-60 mg by mouth See admin instructions. 60 mg in the morning, 30 mg in the evening   enoxaparin 40 MG/0.4ML injection Commonly known as: LOVENOX Inject 0.4 mLs (40 mg total) into the skin every 12 (twelve) hours for 14 days.   ezetimibe 10 MG tablet Commonly known as: ZETIA Take 10 mg by mouth daily.   fenofibrate 54 MG tablet TAKE 1 TABLET BY MOUTH A DAY AT SUPPERTIME   fluticasone 50 MCG/ACT nasal spray Commonly known as: FLONASE Place 2 sprays into both nostrils daily as needed for allergies.   furosemide 20 MG tablet Commonly known as: LASIX Take 20 mg by mouth daily.   gemfibrozil 600 MG tablet Commonly known as: LOPID Take 1 tablet (600 mg total) by mouth 2 (two) times daily before a meal.   glucose blood test strip Commonly known as: True Metrix Blood Glucose Test Use as instructed three times a day diag e11.65   hydrochlorothiazide 12.5 MG tablet Commonly known as: HYDRODIURIL Take 1 tablet (12.5 mg total) by mouth daily.   Insulin Pen Needle 31G X 5 MM Misc Commonly known as: B-D UF III MINI PEN  NEEDLES Use as directed with insulin e11.65   insulin regular 100 units/mL injection Commonly known as: NovoLIN R Patient to use Novolin R [place] TiD prior to meals per sliding scale instructions. Max daily dose is 45 units per day.   Insulin Syringe-Needle U-100 31G X 5/16" 0.3 ML Misc Commonly known as: BD Insulin Syringe U/F 1/2Unit Indulin injection QID. Use with lantus and regular insulin Dx. 11.65   INSULIN SYRINGE 1CC/29G 29G X 1/2" 1 ML Misc Insulin injections QID, use with lantus and regular insulin. DX E11.65   Lantus SoloStar 100 UNIT/ML Solostar Pen Generic drug: Insulin Glargine Inject 71 Units into the skin at bedtime.   lidocaine 5 % Commonly known as: LIDODERM Place 1 patch onto the skin daily as needed (pain). Remove & Discard patch within 12 hours or as directed by MD   Narcan 4 MG/0.1ML Liqd nasal spray kit Generic drug: naloxone Take 4 mg by mouth as needed (for opioid overdose).   omega-3 acid ethyl esters 1 g capsule Commonly known as: LOVAZA Take 1 capsule (1 g total) by mouth 2 (two) times daily.   Oxcarbazepine 300 MG tablet Commonly known as: TRILEPTAL Take 300-600 mg by mouth See admin instructions. Take 1 tablet (300MG) by mouth every morning and 2 tablets (600MG) by mouth every night   oxyCODONE 5 MG immediate release tablet Commonly known as: Oxy IR/ROXICODONE Take 1-2 tablets (5-10 mg total) by mouth every 4 (four) hours as needed for moderate pain (pain score 4-6). What changed:   how much to take  reasons to take this   oxyCODONE 20 mg 12 hr tablet Commonly known as: OXYCONTIN Take 1 tablet (20 mg total) by mouth every 12 (twelve) hours. What changed: Another medication with the same name was changed. Make sure you understand how and when to take each.   pantoprazole 40 MG tablet Commonly known as: PROTONIX TAKE 1 TABLET BY MOUTH ONCE DAILY FOR REFLUX What changed: additional instructions   polyethylene glycol 17 g packet Commonly  known as: MIRALAX / GLYCOLAX Take 17 g by mouth daily.   pregabalin 200 MG capsule Commonly known as: LYRICA Take 200-400 mg by mouth See admin  instructions. 400 mg in the morning, 200 mg in the evening   tamsulosin 0.4 MG Caps capsule Commonly known as: FLOMAX TAKE 1 CAPSULE BY MOUTH ONCE DAILY AFTERSUPPER What changed: See the new instructions.       Diagnostic Studies: Ct Head Wo Contrast  Result Date: 02/05/2019 CLINICAL DATA:  Altered mental status, confusion EXAM: CT HEAD WITHOUT CONTRAST TECHNIQUE: Contiguous axial images were obtained from the base of the skull through the vertex without intravenous contrast. COMPARISON:  10/06/2018 FINDINGS: Brain: No evidence of acute infarction, hemorrhage, hydrocephalus, extra-axial collection or mass lesion/mass effect. Encephalomalacia from left basal ganglia lacunar infarct. Stable ventricular size and configuration. Vascular: Mild atherosclerotic calcifications involving the large vessels of the skull base. No unexpected hyperdense vessel. Skull: Normal. Negative for fracture or focal lesion. Sinuses/Orbits: No acute finding. Other: None. IMPRESSION: No acute intracranial findings.  Stable exam from 10/06/2018. Electronically Signed   By: Davina Poke M.D.   On: 02/05/2019 16:14   Dg Ankle Left Port  Result Date: 01/31/2019 CLINICAL DATA:  Status post left ankle fixation. EXAM: PORTABLE LEFT ANKLE - 2 VIEW COMPARISON:  January 03, 2019 FINDINGS: Post sideplate and screw fixation of the left lateral malleolar fracture, and 2 cancellous screw fixation of the medial malleolar fracture. Minimal improvement in alignment. Overlying soft tissue swelling. IMPRESSION: Post ORIF of medial and lateral malleolar fractures. Electronically Signed   By: Fidela Salisbury M.D.   On: 01/31/2019 16:11    Disposition: Discharge disposition: 01-Home or Self Care       Discharge Instructions    Call MD / Call 911   Complete by: As directed    If you  experience chest pain or shortness of breath, CALL 911 and be transported to the hospital emergency room.  If you develope a fever above 101 F, pus (white drainage) or increased drainage or redness at the wound, or calf pain, call your surgeon's office.   Constipation Prevention   Complete by: As directed    Drink plenty of fluids.  Prune juice may be helpful.  You may use a stool softener, such as Colace (over the counter) 100 mg twice a day.  Use MiraLax (over the counter) for constipation as needed.   Diet - low sodium heart healthy   Complete by: As directed    Discharge instructions   Complete by: As directed    Continue Non-weightbearing on the left lower extremity  6 weeks postop.  Elevate the left lower extremity whenever possible. Keep left ankle splint and dressing on and intact until followup. Take lovenox 40 mg injection twice daily for blood clot prevention. Continue physical therapy for gait training on a walker and lower extremity strengthening.   Driving restrictions   Complete by: As directed    No driving for 6-8 weeks   Increase activity slowly as tolerated   Complete by: As directed    Lifting restrictions   Complete by: As directed    No lifting for 12-16 weeks       Contact information for follow-up providers    Thornton Park, MD. Schedule an appointment as soon as possible for a visit.   Specialty: Orthopedic Surgery Why: follow-up in 7-10 days Contact information: [place] [address] [phone]            Contact information for after-discharge care    Destination    [facility] Preferred SNF .   Service: Skilled Nursing Contact information: [place]  [place] [phone]                   Signed: Thornton Park ,MD 02/07/2019, 1:47 PM

## 2019-02-09 DIAGNOSIS — K219 Gastro-esophageal reflux disease without esophagitis: Secondary | ICD-10-CM | POA: Diagnosis not present

## 2019-02-09 DIAGNOSIS — I739 Peripheral vascular disease, unspecified: Secondary | ICD-10-CM | POA: Diagnosis not present

## 2019-02-09 DIAGNOSIS — M5489 Other dorsalgia: Secondary | ICD-10-CM | POA: Diagnosis not present

## 2019-02-09 DIAGNOSIS — F329 Major depressive disorder, single episode, unspecified: Secondary | ICD-10-CM | POA: Diagnosis not present

## 2019-02-09 DIAGNOSIS — Z794 Long term (current) use of insulin: Secondary | ICD-10-CM | POA: Diagnosis not present

## 2019-02-09 DIAGNOSIS — S82842D Displaced bimalleolar fracture of left lower leg, subsequent encounter for closed fracture with routine healing: Secondary | ICD-10-CM | POA: Diagnosis not present

## 2019-02-09 DIAGNOSIS — I10 Essential (primary) hypertension: Secondary | ICD-10-CM | POA: Diagnosis not present

## 2019-02-09 DIAGNOSIS — Z79891 Long term (current) use of opiate analgesic: Secondary | ICD-10-CM | POA: Diagnosis not present

## 2019-02-09 DIAGNOSIS — E119 Type 2 diabetes mellitus without complications: Secondary | ICD-10-CM | POA: Diagnosis not present

## 2019-02-11 DIAGNOSIS — S82842G Displaced bimalleolar fracture of left lower leg, subsequent encounter for closed fracture with delayed healing: Secondary | ICD-10-CM | POA: Diagnosis not present

## 2019-02-15 DIAGNOSIS — M5489 Other dorsalgia: Secondary | ICD-10-CM | POA: Diagnosis not present

## 2019-02-15 DIAGNOSIS — I739 Peripheral vascular disease, unspecified: Secondary | ICD-10-CM | POA: Diagnosis not present

## 2019-02-15 DIAGNOSIS — S82842D Displaced bimalleolar fracture of left lower leg, subsequent encounter for closed fracture with routine healing: Secondary | ICD-10-CM | POA: Diagnosis not present

## 2019-02-15 DIAGNOSIS — D649 Anemia, unspecified: Secondary | ICD-10-CM | POA: Diagnosis not present

## 2019-02-15 DIAGNOSIS — N390 Urinary tract infection, site not specified: Secondary | ICD-10-CM | POA: Diagnosis not present

## 2019-02-15 DIAGNOSIS — Z794 Long term (current) use of insulin: Secondary | ICD-10-CM | POA: Diagnosis not present

## 2019-02-15 DIAGNOSIS — E119 Type 2 diabetes mellitus without complications: Secondary | ICD-10-CM | POA: Diagnosis not present

## 2019-02-15 DIAGNOSIS — F329 Major depressive disorder, single episode, unspecified: Secondary | ICD-10-CM | POA: Diagnosis not present

## 2019-02-15 DIAGNOSIS — K219 Gastro-esophageal reflux disease without esophagitis: Secondary | ICD-10-CM | POA: Diagnosis not present

## 2019-02-22 DIAGNOSIS — S82842G Displaced bimalleolar fracture of left lower leg, subsequent encounter for closed fracture with delayed healing: Secondary | ICD-10-CM | POA: Diagnosis not present

## 2019-02-22 DIAGNOSIS — M1711 Unilateral primary osteoarthritis, right knee: Secondary | ICD-10-CM | POA: Diagnosis not present

## 2019-02-26 DIAGNOSIS — F329 Major depressive disorder, single episode, unspecified: Secondary | ICD-10-CM | POA: Diagnosis not present

## 2019-02-26 DIAGNOSIS — I10 Essential (primary) hypertension: Secondary | ICD-10-CM | POA: Diagnosis not present

## 2019-02-26 DIAGNOSIS — S82842D Displaced bimalleolar fracture of left lower leg, subsequent encounter for closed fracture with routine healing: Secondary | ICD-10-CM | POA: Diagnosis not present

## 2019-02-26 DIAGNOSIS — I739 Peripheral vascular disease, unspecified: Secondary | ICD-10-CM | POA: Diagnosis not present

## 2019-02-26 DIAGNOSIS — K219 Gastro-esophageal reflux disease without esophagitis: Secondary | ICD-10-CM | POA: Diagnosis not present

## 2019-02-26 DIAGNOSIS — G464 Cerebellar stroke syndrome: Secondary | ICD-10-CM | POA: Diagnosis not present

## 2019-02-26 DIAGNOSIS — D649 Anemia, unspecified: Secondary | ICD-10-CM | POA: Diagnosis not present

## 2019-02-26 DIAGNOSIS — F331 Major depressive disorder, recurrent, moderate: Secondary | ICD-10-CM | POA: Diagnosis not present

## 2019-02-26 DIAGNOSIS — F5101 Primary insomnia: Secondary | ICD-10-CM | POA: Diagnosis not present

## 2019-02-26 DIAGNOSIS — Z794 Long term (current) use of insulin: Secondary | ICD-10-CM | POA: Diagnosis not present

## 2019-02-26 DIAGNOSIS — E119 Type 2 diabetes mellitus without complications: Secondary | ICD-10-CM | POA: Diagnosis not present

## 2019-02-26 DIAGNOSIS — M6281 Muscle weakness (generalized): Secondary | ICD-10-CM | POA: Diagnosis not present

## 2019-02-26 DIAGNOSIS — F1110 Opioid abuse, uncomplicated: Secondary | ICD-10-CM | POA: Diagnosis not present

## 2019-02-27 ENCOUNTER — Other Ambulatory Visit: Payer: Self-pay | Admitting: Adult Health

## 2019-03-05 DIAGNOSIS — G464 Cerebellar stroke syndrome: Secondary | ICD-10-CM | POA: Diagnosis not present

## 2019-03-05 DIAGNOSIS — F5101 Primary insomnia: Secondary | ICD-10-CM | POA: Diagnosis not present

## 2019-03-05 DIAGNOSIS — F1110 Opioid abuse, uncomplicated: Secondary | ICD-10-CM | POA: Diagnosis not present

## 2019-03-05 DIAGNOSIS — I10 Essential (primary) hypertension: Secondary | ICD-10-CM | POA: Diagnosis not present

## 2019-03-05 DIAGNOSIS — Z794 Long term (current) use of insulin: Secondary | ICD-10-CM | POA: Diagnosis not present

## 2019-03-05 DIAGNOSIS — E119 Type 2 diabetes mellitus without complications: Secondary | ICD-10-CM | POA: Diagnosis not present

## 2019-03-05 DIAGNOSIS — F331 Major depressive disorder, recurrent, moderate: Secondary | ICD-10-CM | POA: Diagnosis not present

## 2019-03-15 DIAGNOSIS — Z9889 Other specified postprocedural states: Secondary | ICD-10-CM | POA: Diagnosis not present

## 2019-03-17 DIAGNOSIS — Z89412 Acquired absence of left great toe: Secondary | ICD-10-CM | POA: Diagnosis not present

## 2019-03-17 DIAGNOSIS — I10 Essential (primary) hypertension: Secondary | ICD-10-CM | POA: Diagnosis not present

## 2019-03-17 DIAGNOSIS — N390 Urinary tract infection, site not specified: Secondary | ICD-10-CM | POA: Diagnosis not present

## 2019-03-17 DIAGNOSIS — M6281 Muscle weakness (generalized): Secondary | ICD-10-CM | POA: Diagnosis not present

## 2019-03-17 DIAGNOSIS — I739 Peripheral vascular disease, unspecified: Secondary | ICD-10-CM | POA: Diagnosis not present

## 2019-03-17 DIAGNOSIS — D649 Anemia, unspecified: Secondary | ICD-10-CM | POA: Diagnosis not present

## 2019-03-17 DIAGNOSIS — S82842D Displaced bimalleolar fracture of left lower leg, subsequent encounter for closed fracture with routine healing: Secondary | ICD-10-CM | POA: Diagnosis not present

## 2019-03-17 DIAGNOSIS — Z794 Long term (current) use of insulin: Secondary | ICD-10-CM | POA: Diagnosis not present

## 2019-03-17 DIAGNOSIS — E119 Type 2 diabetes mellitus without complications: Secondary | ICD-10-CM | POA: Diagnosis not present

## 2019-03-23 ENCOUNTER — Emergency Department: Payer: Medicare HMO

## 2019-03-23 ENCOUNTER — Emergency Department
Admission: EM | Admit: 2019-03-23 | Discharge: 2019-03-23 | Disposition: A | Discharge status: Home / Self Care | Payer: Medicare HMO | Attending: Emergency Medicine | Admitting: Emergency Medicine

## 2019-03-23 ENCOUNTER — Other Ambulatory Visit: Payer: Self-pay

## 2019-03-23 DIAGNOSIS — Y9389 Activity, other specified: Secondary | ICD-10-CM | POA: Diagnosis not present

## 2019-03-23 DIAGNOSIS — Z87891 Personal history of nicotine dependence: Secondary | ICD-10-CM | POA: Diagnosis not present

## 2019-03-23 DIAGNOSIS — S82832A Other fracture of upper and lower end of left fibula, initial encounter for closed fracture: Secondary | ICD-10-CM | POA: Diagnosis not present

## 2019-03-23 DIAGNOSIS — E119 Type 2 diabetes mellitus without complications: Secondary | ICD-10-CM | POA: Diagnosis not present

## 2019-03-23 DIAGNOSIS — Y999 Unspecified external cause status: Secondary | ICD-10-CM | POA: Insufficient documentation

## 2019-03-23 DIAGNOSIS — Y9289 Other specified places as the place of occurrence of the external cause: Secondary | ICD-10-CM | POA: Insufficient documentation

## 2019-03-23 DIAGNOSIS — Z7902 Long term (current) use of antithrombotics/antiplatelets: Secondary | ICD-10-CM | POA: Insufficient documentation

## 2019-03-23 DIAGNOSIS — S99912A Unspecified injury of left ankle, initial encounter: Secondary | ICD-10-CM | POA: Diagnosis present

## 2019-03-23 DIAGNOSIS — Z7901 Long term (current) use of anticoagulants: Secondary | ICD-10-CM | POA: Insufficient documentation

## 2019-03-23 DIAGNOSIS — I5032 Chronic diastolic (congestive) heart failure: Secondary | ICD-10-CM | POA: Insufficient documentation

## 2019-03-23 DIAGNOSIS — Z794 Long term (current) use of insulin: Secondary | ICD-10-CM | POA: Insufficient documentation

## 2019-03-23 DIAGNOSIS — Z79899 Other long term (current) drug therapy: Secondary | ICD-10-CM | POA: Diagnosis not present

## 2019-03-23 DIAGNOSIS — I110 Hypertensive heart disease with heart failure: Secondary | ICD-10-CM | POA: Diagnosis not present

## 2019-03-23 DIAGNOSIS — Z7982 Long term (current) use of aspirin: Secondary | ICD-10-CM | POA: Diagnosis not present

## 2019-03-23 DIAGNOSIS — X509XXA Other and unspecified overexertion or strenuous movements or postures, initial encounter: Secondary | ICD-10-CM | POA: Diagnosis not present

## 2019-03-23 DIAGNOSIS — S93402A Sprain of unspecified ligament of left ankle, initial encounter: Secondary | ICD-10-CM | POA: Insufficient documentation

## 2019-03-23 LAB — BASIC METABOLIC PANEL
Anion gap: 12 (ref 5–15)
BUN: 36 mg/dL — ABNORMAL HIGH (ref 8–23)
CO2: 25 mmol/L (ref 22–32)
Calcium: 8.9 mg/dL (ref 8.9–10.3)
Chloride: 102 mmol/L (ref 98–111)
Creatinine, Ser: 1.07 mg/dL (ref 0.61–1.24)
GFR calc Af Amer: >60 mL/min (ref >60)
GFR calc non Af Amer: >60 mL/min (ref >60)
Glucose, Bld: 239 mg/dL — ABNORMAL HIGH (ref 70–99)
Potassium: 4.1 mmol/L (ref 3.5–5.1)
Sodium: 139 mmol/L (ref 135–145)

## 2019-03-23 LAB — CBC WITH DIFFERENTIAL/PLATELET
Abs Immature Granulocytes: 0.11 10*3/uL — ABNORMAL HIGH (ref 0.00–0.07)
Basophils Absolute: 0 10*3/uL (ref 0.0–0.1)
Basophils Relative: 0 %
Eosinophils Absolute: 0 10*3/uL (ref 0.0–0.5)
Eosinophils Relative: 0 %
HCT: 29.1 % — ABNORMAL LOW (ref 39.0–52.0)
Hemoglobin: 9.1 g/dL — ABNORMAL LOW (ref 13.0–17.0)
Immature Granulocytes: 1 %
Lymphocytes Relative: 10 %
Lymphs Abs: 0.9 10*3/uL (ref 0.7–4.0)
MCH: 25.1 pg — ABNORMAL LOW (ref 26.0–34.0)
MCHC: 31.3 g/dL (ref 30.0–36.0)
MCV: 80.4 fL (ref 80.0–100.0)
Monocytes Absolute: 0.4 10*3/uL (ref 0.1–1.0)
Monocytes Relative: 5 %
Neutro Abs: 7.8 10*3/uL — ABNORMAL HIGH (ref 1.7–7.7)
Neutrophils Relative %: 84 %
Platelets: 234 10*3/uL (ref 150–400)
RBC: 3.62 MIL/uL — ABNORMAL LOW (ref 4.22–5.81)
RDW: 15.9 % — ABNORMAL HIGH (ref 11.5–15.5)
WBC: 9.3 10*3/uL (ref 4.0–10.5)
nRBC: 0 % (ref 0.0–0.2)

## 2019-03-23 MED ORDER — TRAMADOL HCL 50 MG PO TABS
50.0000 mg | ORAL_TABLET | Freq: Once | ORAL | Status: AC
  Administered 2019-03-23: 17:00:00 50 mg via ORAL
  Filled 2019-03-23: qty 1

## 2019-03-23 NOTE — ED Triage Notes (Signed)
Pt arrives via [facility] from [facility], pt states that his left ankle is bothering him today, pt has a boot on his left foot. Pt states that the right leg caused his left leg to fold up on him. No distress noted at this time, pt uncomfortable in appearance when left ankle attempted to reposition

## 2019-03-23 NOTE — Discharge Instructions (Signed)
Follow discharge care instructions and continue previous medications. °

## 2019-03-23 NOTE — ED Notes (Signed)
Pt verbalized understanding of discharge instructions and [facility] called. Pt in NAD at this time.

## 2019-03-23 NOTE — ED Notes (Signed)
Called [facility] for transport to [place]

## 2019-03-23 NOTE — ED Provider Notes (Signed)
[facility] Emergency Department Provider Note   ____________________________________________   First MD Initiated Contact with Patient 03/23/19 1048     (approximate)  I have reviewed the triage vital signs and the nursing notes.   HISTORY  Chief Complaint Ankle Pain    HPI Victor Wise is a 68 y.o. male patient arrived via [facility] complaining of left ankle pain.  Patient wears a walking boot secondary to previous left ankle fracture acquired internal fixation.  Patient states his right leg buckled causing him to twist his left foot.  Now presents with pain and swelling.  Patient is a poor historian.      Past Medical History:  Diagnosis Date  . Allergy   . Basal cell carcinoma    forehead  . BPH (benign prostatic hyperplasia)   . CHF (congestive heart failure) ([place])   . Chronic back pain   . Coronary artery disease   . Depression   . Diabetes ([place])   . GERD (gastroesophageal reflux disease)   . Heart attack ([place])    2018  . Hypertension   . Morbid obesity ([place])   . Obstructive sleep apnea   . 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 Status post insertion of spinal cord stimulator   . Stroke [facility])    last one 2-3 years ago  . UTI (lower urinary tract infection)     Patient Active Problem List   Diagnosis Date Noted  . Bimalleolar fracture of left ankle 01/31/2019  . Bilateral leg edema 08/20/2018  . Diabetic ulcer of left foot ([place]) 04/18/2018  . Swelling of limb 04/06/2018  . Atherosclerosis of native arteries of the extremities with ulceration ([place]) 04/06/2018  . Edema of lower extremity 04/04/2018  . Diabetic foot infection ([place]) 03/01/2018  . Spinal cord stimulator status 01/12/2018  . Essential hypertension 09/05/2017  . OSA on CPAP 09/05/2017  . Uncontrolled type 2 diabetes mellitus with hyperglycemia ([place]) 08/02/2017  . Peripheral vascular disease ([place]) 08/02/2017  . Mixed hyperlipidemia 08/02/2017  . Dysuria  08/02/2017  . Obesity, Class III, BMI 40-49.9 (morbid obesity) ([place]) 08/10/2016  . Hemiparesis affecting left side as late effect of stroke ([place]) 05/09/2016  . Symptomatic bradycardia 03/21/2016  . Falls frequently 02/26/2016  . Acute renal failure (ARF) ([place]) 02/18/2016  . Hypotension 02/18/2016  . Sick sinus syndrome ([place]) 10/28/2015  . Syncopal episodes 10/26/2015  . Syncope 10/26/2015  . Adjustment disorder with mixed anxiety and depressed mood 10/20/2015  . Dysthymia 10/20/2015  . CVA (cerebral infarction) 10/19/2015  . Urinary retention 03/25/2015  . 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 Coronary artery disease due to lipid rich plaque 12/29/2014  . Acute MI, anterolateral wall, subsequent episode of care ([place]) 12/19/2014  . SIRS (systemic inflammatory response syndrome) ([place]) 12/18/2014  . Systemic inflammatory response syndrome (sirs) of non-infectious origin without acute organ dysfunction ([place]) 12/18/2014  . Encounter for other preprocedural examination 11/10/2014  . LVH (left ventricular hypertrophy) due to hypertensive disease, with heart failure ([place]) 10/02/2014  . Benign essential hypertension 09/26/2014  . Chronic diastolic CHF (congestive heart failure), NYHA class 2 ([place]) 06/03/2014  . Pain syndrome, chronic 03/02/2014  . Diabetes ([place]) 11/08/2013  . Chronic radicular low back pain 08/21/2013  . Right shoulder pain 04/03/2013  . Urinary tract infection 03/16/2012  . Balanoposthitis 02/13/2012  . History of urinary stone 02/13/2012  . Hypertrophy of prostate with urinary obstruction and other lower urinary tract symptoms (LUTS) 02/13/2012  .  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 Redundant prepuce and phimosis 02/13/2012  . Urge incontinence 02/13/2012  . Right foot pain 01/18/2012  . Chronic, continuous use of opioids 08/22/2011    Past Surgical History:  Procedure Laterality Date  . AMPUTATION TOE Left 04/19/2018   Procedure: AMPUTATION TOE-LEFT GREAT TOE;  Surgeon:  Sharlotte Alamo, DPM;  Location: [facility] ORS;  Service: Podiatry;  Laterality: Left;  . CARDIAC CATHETERIZATION N/A 12/19/2014   Procedure: Coronary Stent Intervention;  Surgeon: Charolette Forward, MD;  Location: [place] CV LAB;  Service: Cardiovascular;  Laterality: N/A;  . CARDIAC CATHETERIZATION Left 12/19/2014   Procedure: Left Heart Cath and Coronary Angiography;  Surgeon: Dionisio Herson, MD;  Location: [place] [facility];  Service: Cardiovascular;  Laterality: Left;  . IRRIGATION AND DEBRIDEMENT FOOT Left 03/02/2018   Procedure: IRRIGATION AND DEBRIDEMENT FOOT;  Surgeon: Samara Deist, DPM;  Location: [facility] ORS;  Service: Podiatry;  Laterality: Left;  . IRRIGATION AND DEBRIDEMENT FOOT Left 03/08/2018   Procedure: IRRIGATION AND DEBRIDEMENT FOOT AND BONE;  Surgeon: Sharlotte Alamo, DPM;  Location: [facility] ORS;  Service: Podiatry;  Laterality: Left;  . IRRIGATION AND DEBRIDEMENT FOOT Left 04/19/2018   Procedure: IRRIGATION AND DEBRIDEMENT FOOT;  Surgeon: Sharlotte Alamo, DPM;  Location: [facility] OR[facility];  Service: Podiatry;  Laterality: Left;  . LOWER EXTREMITY ANGIOGRAPHY Left 03/05/2018   Procedure: Lower Extremity Angiography;  Surgeon: Algernon Huxley, MD;  Location: [place] [facility];  Service: Cardiovascular;  Laterality: Left;  . LOWER EXTREMITY ANGIOGRAPHY Left 03/08/2018   Procedure: LOWER EXTREMITY ANGIOGRAPHY;  Surgeon: Algernon Huxley, MD;  Location: [place] CV LAB;  Service: Cardiovascular;  Laterality: Left;  . ORIF ANKLE FRACTURE Left 01/31/2019   Procedure: open reduction internal fixation left bimalleolar ankle fracture;  Surgeon: Thornton Park, MD;  Location: [facility] ORS;  Service: Orthopedics;  Laterality: Left;  . PACEMAKER INSERTION Left 11/02/2015   Procedure: INSERTION PACEMAKER;  Surgeon: Isaias Cowman, MD;  Location: [facility] ORS;  Service: Cardiovascular;  Laterality: Left;  . Pain Stimulator    . Right toe amputation      Prior to Admission medications   Medication Sig Start Date End  Date Taking? Authorizing Provider  acetaminophen (TYLENOL) 325 MG tablet Take 1-2 tablets (325-650 mg total) by mouth every 6 (six) hours as needed for mild pain (pain score 1-3 or temp > 100.5). 02/07/19   Thornton Park, MD  amLODipine (NORVASC) 10 MG tablet TAKE 1 TABLET BY MOUTH ONCE DAILY 12/03/18   Kendell Bane, NP  aspirin EC 81 MG tablet Take 81 mg by mouth daily.    [provider]  benazepril (LOTENSIN) 20 MG tablet TAKE 1 TABLET BY MOUTH TWICE DAILY 02/27/19   Kendell Bane, NP  bisacodyl (DULCOLAX) 10 MG suppository Place 1 suppository (10 mg total) rectally daily as needed for moderate constipation. 02/07/19   Thornton Park, MD  cetirizine (ZYRTEC) 10 MG tablet Take 10 mg by mouth daily as needed for allergies.    [provider]  clopidogrel (PLAVIX) 75 MG tablet Take 1 tablet (75 mg total) by mouth daily. 06/28/18   Ronnell Freshwater, NP  docusate sodium (COLACE) 100 MG capsule Take 1 capsule (100 mg total) by mouth 2 (two) times daily. 02/07/19   Thornton Park, MD  DULoxetine (CYMBALTA) 30 MG capsule Take 30-60 mg by mouth See admin instructions. 60 mg in the morning, 30 mg in the evening    [provider]  enoxaparin (LOVENOX) 40 MG/0.4ML injection  Inject 0.4 mLs (40 mg total) into the skin every 12 (twelve) hours for 14 days. 02/07/19 02/21/19  Thornton Park, MD  Exenatide ER (BYDUREON) 2 MG PEN Inject 2 mg into the skin once a week. 11/26/18   Kendell Bane, NP  ezetimibe (ZETIA) 10 MG tablet Take 10 mg by mouth daily.  10/29/18   [provider]  fenofibrate 54 MG tablet TAKE 1 TABLET BY MOUTH A DAY AT SUPPERTIME 12/03/18   Kendell Bane, NP  fluticasone (FLONASE) 50 MCG/ACT nasal spray Place 2 sprays into both nostrils daily as needed for allergies.     [provider]  furosemide (LASIX) 20 MG tablet Take 20 mg by mouth daily. 10/01/18   [provider]  gemfibrozil (LOPID) 600 MG tablet Take 1 tablet (600 mg  total) by mouth 2 (two) times daily before a meal. 12/12/18   [place]ar[place], Audie Clear, NP  glucose blood (TRUE METRIX BLOOD GLUCOSE TEST) test strip Use as instructed three times a day diag e11.65 08/13/18   Ronnell Freshwater, NP  hydrochlorothiazide (HYDRODIURIL) 12.5 MG tablet Take 1 tablet (12.5 mg total) by mouth daily. 01/31/19   Ronnell Freshwater, NP  Insulin Glargine (LANTUS SOLOSTAR) 100 UNIT/ML Solostar Pen Inject 71 Units into the skin at bedtime. 01/07/19   Kendell Bane, NP  Insulin Pen Needle (B-D UF III MINI PEN NEEDLES) 31G X 5 MM MISC Use as directed with insulin e11.65 12/26/17   Ronnell Freshwater, NP  insulin regular (NOVOLIN R) 100 units/mL injection Patient to use Novolin R [place] TiD prior to meals per sliding scale instructions. Max daily dose is 45 units per day. 06/22/18   Kendell Bane, NP  INSULIN SYRINGE 1CC/29G 29G X 1/2" 1 ML MISC Insulin injections QID, use with lantus and regular insulin. DX E11.65 07/05/18   Kendell Bane, NP  Insulin Syringe-Needle U-100 (BD INSULIN SYRINGE U/F 1/2UNIT) 31G X 5/16" 0.3 ML MISC Indulin injection QID. Use with lantus and regular insulin Dx. 11.65 01/08/18   Lavera Guise, MD  lidocaine (LIDODERM) 5 % Place 1 patch onto the skin daily as needed (pain). Remove & Discard patch within 12 hours or as directed by MD    [provider]  NARCAN 4 MG/0.1ML LIQD Take 4 mg by mouth as needed (for opioid overdose).     [provider]  omega-3 acid ethyl esters (LOVAZA) 1 g capsule Take 1 capsule (1 g total) by mouth 2 (two) times daily. 01/24/19   Kendell Bane, NP  Oxcarbazepine (TRILEPTAL) 300 MG tablet Take 300-600 mg by mouth See admin instructions. Take 1 tablet (300MG ) by mouth every morning and 2 tablets (600MG ) by mouth every night    [provider]  oxyCODONE (OXY IR/ROXICODONE) 5 MG immediate release tablet Take 1-2 tablets (5-10 mg total) by mouth every 4 (four) hours as needed for moderate pain (pain score 4-6). 02/07/19    Thornton Park, MD  oxyCODONE (OXYCONTIN) 20 mg 12 hr tablet Take 1 tablet (20 mg total) by mouth every 12 (twelve) hours. 02/07/19   Thornton Park, MD  pantoprazole (PROTONIX) 40 MG tablet TAKE 1 TABLET BY MOUTH ONCE DAILY FOR REFLUX 02/01/19   Kendell Bane, NP  polyethylene glycol (MIRALAX / GLYCOLAX) packet Take 17 g by mouth daily.     [provider]  pregabalin (LYRICA) 200 MG capsule Take 200-400 mg by mouth See admin instructions. 400 mg in the morning, 200 mg in  the evening    [provider]  tamsulosin (FLOMAX) 0.4 MG CAPS capsule TAKE 1 CAPSULE BY MOUTH ONCE DAILY AFTERSUPPER 02/01/19   Kendell Bane, NP    Allergies Amoxicillin, Other, Penicillins, Hydralazine, Cephalexin, Clindamycin, Crestor [rosuvastatin calcium], Metoprolol, Red dye, Statins, Sulfa antibiotics, Yellow dyes (non-tartrazine), Aspirin, and Tape  Family History  Problem Relation Age of Onset  . Breast cancer Mother   . Cancer Mother   . Hypertension Mother   . Lung cancer Father   . Hypertension Father   . Heart disease Father   . Cancer Father   . Kidney disease Sister   . Prostate cancer Neg Hx     Social History Social History   Tobacco Use  . Smoking status: Former Smoker    Types: Cigarettes  . Smokeless tobacco: Never Used  . Tobacco comment: quit 45 years  Substance Use Topics  . Alcohol use: Not Currently    Alcohol/week: 0.0 standard drinks    Comment: have not had alcohol in 33yrs  . Drug use: No    Review of Systems  Constitutional: No fever/chills Eyes: No visual changes. ENT: No sore throat. Cardiovascular: Denies chest pain. Respiratory: Denies shortness of breath. Gastrointestinal: No abdominal pain.  No nausea, no vomiting.  No diarrhea.  No constipation. Genitourinary: Negative for dysuria.  BPH Musculoskeletal: Negative for back pain. Skin: Negative for rash. Neurological: Negative for headaches, focal weakness or numbness. Psychiatric:   Depression. Endocrine:  Diabetes, hyperlipidemia, hypertension. Allergic/Immunilogical: See extensive medication allergy list. ____________________________________________   PHYSICAL EXAM:  VITAL SIGNS: ED Triage Vitals  Enc Vitals Group     BP      Pulse      Resp      Temp      Temp src      SpO2      Weight      Height      Head Circumference      [place] Flow      Pain Score      Pain Loc      Pain Edu?      Excl. in [place]?     Constitutional: Alert and oriented. Well appearing and in no acute distress. Cardiovascular: Normal rate, regular rhythm. Grossly normal heart sounds.  Good peripheral circulation. Respiratory: Normal respiratory effort.  No retractions. Lungs CTAB. Musculoskeletal: Obvious edema bilateral left ankle.  No obvious deformity.  Surgical scars consistent with history internal fixation.  Moderate guarding palpation dorsal aspect of the foot and the distal fibular area.  No lower extremity tenderness nor edema.  Mild joint effusions. Neurologic:  Normal speech and language. No gross focal neurologic deficits are appreciated. No gait instability. Skin:  Skin is warm, dry and intact. No rash noted. Psychiatric: Mood and affect are normal. Speech and behavior are normal.  ____________________________________________   LABS (all labs ordered are listed, but only abnormal results are displayed)  Labs Reviewed  CBC WITH DIFFERENTIAL/PLATELET - Abnormal; Notable for the following components:      Result Value   RBC 3.62 (*)    Hemoglobin 9.1 (*)    HCT 29.1 (*)    MCH 25.1 (*)    RDW 15.9 (*)    Neutro Abs 7.8 (*)    Abs Immature Granulocytes 0.11 (*)    All other components within normal limits  BASIC METABOLIC PANEL - Abnormal; Notable for the following components:   Glucose, Bld 239 (*)    BUN 36 (*)  All other components within normal limits   ____________________________________________  EKG   ____________________________________________   RADIOLOGY  ED MD interpretation:    Official radiology report(s): Dg Ankle Complete Left  Result Date: 03/23/2019 CLINICAL DATA:  68 year old male with increasing pain EXAM: LEFT ANKLE COMPLETE - 3+ VIEW COMPARISON:  01/31/2019 FINDINGS: Surgical changes of distal fibula plate and screw fixation. Surgical changes of screw fixation of medial malleolar fracture. Incomplete remodeling at both sites. Joint effusion on the lateral view.  Mild soft tissue swelling. No evidence of acute fracture.  No hardware fracture. Degenerative changes of the hindfoot IMPRESSION: Redemonstration of ORIF recent distal fibular and tibial fracture with incomplete remodeling at both fracture site. No hardware fracture or new acute bony abnormality. Tibiotalar joint effusion. Electronically Signed   By: Corrie Mckusick D.O.   On: 03/23/2019 11:51    ____________________________________________   PROCEDURES  Procedure(s) performed (including Critical Care):  Procedures   ____________________________________________   INITIAL IMPRESSION / ASSESSMENT AND PLAN / ED COURSE  As part of my medical decision making, I reviewed the following data within the electronic MEDICAL RECORD NUMBER         Victor Wise was evaluated in Emergency Department on 03/23/2019 for the symptoms described in the history of present illness. He was evaluated in the context of the global COVID-19 pandemic, which necessitated consideration that the patient might be at risk for infection with the SARS-CoV-2 virus that causes COVID-19. Institutional protocols and algorithms that pertain to the evaluation of patients at risk for COVID-19 are in a state of rapid change based on information released by regulatory bodies including the CDC and federal and state organizations. These policies and algorithms were followed during the patient's care in the ED.  Patient complain left ankle pain/swelling status post open reduction internal fixation 2 months  ago.  Patient states this morning he placed weight on the ankle without use of his splint.  Patient state ankle turn causing his complaint of pain.  Discussed x-ray findings with patient.  Patient physical exam is consistent with sprain.  Ace wrap was applied to the ankle patient placed back in a splint.  Patient is to ambulate with splint and walker.  Continue previous medications and follow-up PCP. ____________________________________________   FINAL CLINICAL IMPRESSION(S) / ED DIAGNOSES  Final diagnoses:  Sprain of left ankle, unspecified ligament, initial encounter     ED Discharge Orders    None       Note:  This document was prepared using Dragon voice recognition software and may include unintentional dictation errors.    Sable Feil, PA-C 03/23/19 1605    Carrie Mew, MD 03/28/19 [phone]

## 2019-03-23 NOTE — ED Notes (Signed)
Pt had urinated and had a BM in brief- pt cleansed and brief changed

## 2019-03-23 NOTE — ED Notes (Signed)
Hard copy of discharge form printed and signed by patient and this RN.

## 2019-03-24 ENCOUNTER — Emergency Department
Admission: EM | Admit: 2019-03-24 | Discharge: 2019-03-24 | Disposition: A | Discharge status: Skilled Nursing Facility | Payer: Medicare HMO | Attending: Emergency Medicine | Admitting: Emergency Medicine

## 2019-03-24 ENCOUNTER — Other Ambulatory Visit: Payer: Self-pay

## 2019-03-24 DIAGNOSIS — I2510 Atherosclerotic heart disease of native coronary artery without angina pectoris: Secondary | ICD-10-CM | POA: Diagnosis not present

## 2019-03-24 DIAGNOSIS — Z79899 Other long term (current) drug therapy: Secondary | ICD-10-CM | POA: Insufficient documentation

## 2019-03-24 DIAGNOSIS — R5383 Other fatigue: Secondary | ICD-10-CM | POA: Diagnosis not present

## 2019-03-24 DIAGNOSIS — Z87891 Personal history of nicotine dependence: Secondary | ICD-10-CM | POA: Insufficient documentation

## 2019-03-24 DIAGNOSIS — I110 Hypertensive heart disease with heart failure: Secondary | ICD-10-CM | POA: Diagnosis not present

## 2019-03-24 DIAGNOSIS — E1165 Type 2 diabetes mellitus with hyperglycemia: Secondary | ICD-10-CM | POA: Insufficient documentation

## 2019-03-24 DIAGNOSIS — M79605 Pain in left leg: Secondary | ICD-10-CM | POA: Diagnosis not present

## 2019-03-24 DIAGNOSIS — I5032 Chronic diastolic (congestive) heart failure: Secondary | ICD-10-CM | POA: Insufficient documentation

## 2019-03-24 DIAGNOSIS — Z8673 Personal history of transient ischemic attack (TIA), and cerebral infarction without residual deficits: Secondary | ICD-10-CM | POA: Diagnosis not present

## 2019-03-24 DIAGNOSIS — Z950 Presence of cardiac pacemaker: Secondary | ICD-10-CM | POA: Diagnosis not present

## 2019-03-24 DIAGNOSIS — Z7982 Long term (current) use of aspirin: Secondary | ICD-10-CM | POA: Insufficient documentation

## 2019-03-24 DIAGNOSIS — R531 Weakness: Secondary | ICD-10-CM | POA: Diagnosis not present

## 2019-03-24 DIAGNOSIS — G8929 Other chronic pain: Secondary | ICD-10-CM | POA: Diagnosis not present

## 2019-03-24 LAB — CBC WITH DIFFERENTIAL/PLATELET
Abs Immature Granulocytes: 0.1 10*3/uL — ABNORMAL HIGH (ref 0.00–0.07)
Basophils Absolute: 0.1 10*3/uL (ref 0.0–0.1)
Basophils Relative: 1 %
Eosinophils Absolute: 0 10*3/uL (ref 0.0–0.5)
Eosinophils Relative: 0 %
HCT: 27.2 % — ABNORMAL LOW (ref 39.0–52.0)
Hemoglobin: 8.7 g/dL — ABNORMAL LOW (ref 13.0–17.0)
Immature Granulocytes: 1 %
Lymphocytes Relative: 13 %
Lymphs Abs: 1.3 10*3/uL (ref 0.7–4.0)
MCH: 25 pg — ABNORMAL LOW (ref 26.0–34.0)
MCHC: 32 g/dL (ref 30.0–36.0)
MCV: 78.2 fL — ABNORMAL LOW (ref 80.0–100.0)
Monocytes Absolute: 0.8 10*3/uL (ref 0.1–1.0)
Monocytes Relative: 7 %
Neutro Abs: 8.3 10*3/uL — ABNORMAL HIGH (ref 1.7–7.7)
Neutrophils Relative %: 78 %
Platelets: 276 10*3/uL (ref 150–400)
RBC: 3.48 MIL/uL — ABNORMAL LOW (ref 4.22–5.81)
RDW: 15.9 % — ABNORMAL HIGH (ref 11.5–15.5)
WBC: 10.5 10*3/uL (ref 4.0–10.5)
nRBC: 0 % (ref 0.0–0.2)

## 2019-03-24 LAB — COMPREHENSIVE METABOLIC PANEL WITH GFR
ALT: 40 U/L (ref 0–44)
AST: 40 U/L (ref 15–41)
Albumin: 2.8 g/dL — ABNORMAL LOW (ref 3.5–5.0)
Alkaline Phosphatase: 71 U/L (ref 38–126)
Anion gap: 12 (ref 5–15)
BUN: 30 mg/dL — ABNORMAL HIGH (ref 8–23)
CO2: 27 mmol/L (ref 22–32)
Calcium: 9.1 mg/dL (ref 8.9–10.3)
Chloride: 102 mmol/L (ref 98–111)
Creatinine, Ser: 1.13 mg/dL (ref 0.61–1.24)
GFR calc Af Amer: >60 mL/min
GFR calc non Af Amer: >60 mL/min
Glucose, Bld: 200 mg/dL — ABNORMAL HIGH (ref 70–99)
Potassium: 4.4 mmol/L (ref 3.5–5.1)
Sodium: 141 mmol/L (ref 135–145)
Total Bilirubin: 0.4 mg/dL (ref 0.3–1.2)
Total Protein: 7.6 g/dL (ref 6.5–8.1)

## 2019-03-24 LAB — LIPASE, BLOOD: Lipase: 14 U/L (ref 11–51)

## 2019-03-24 MED ORDER — SODIUM CHLORIDE 0.9 % IV BOLUS
500.0000 mL | Freq: Once | INTRAVENOUS | Status: AC
  Administered 2019-03-24: 500 mL via INTRAVENOUS

## 2019-03-24 NOTE — ED Notes (Signed)
Pt unable to sign for discharge d/t no available signature pad

## 2019-03-24 NOTE — ED Notes (Signed)
[name] (daughter and POA) given update

## 2019-03-24 NOTE — ED Provider Notes (Signed)
[facility] Emergency Department Provider Note  ____________________________________________  Time seen: Approximately 5:31 PM  I have reviewed the triage vital signs and the nursing notes.   HISTORY  Chief Complaint Weakness    HPI RALPHIE OSTLING is a 68 y.o. male with a history of CHF CAD diabetes hypertension morbid obesity who is sent to the ED from [facility] due to fatigue.   It was evaluated here in the emergency department yesterday where ED staff provided wound care for his left foot that recently had surgery.  Today at the facility they were worried that the patient was difficult to wake up.  EMS report that he was easily arousable for them and he is awake on arrival.  Patient denies any acute complaints.  He reports having leg pain which has been constant since his surgery which occurred on January 31, 2019.  It is not worse or changed.     Past Medical History:  Diagnosis Date  . Allergy   . Basal cell carcinoma    forehead  . BPH (benign prostatic hyperplasia)   . CHF (congestive heart failure) ([place])   . Chronic back pain   . Coronary artery disease   . Depression   . Diabetes ([place])   . GERD (gastroesophageal reflux disease)   . Heart attack ([place])    2018  . Hypertension   . Morbid obesity ([place])   . Obstructive sleep apnea   . 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 Status post insertion of spinal cord stimulator   . Stroke [facility])    last one 2-3 years ago  . UTI (lower urinary tract infection)      Patient Active Problem List   Diagnosis Date Noted  . Bimalleolar fracture of left ankle 01/31/2019  . Bilateral leg edema 08/20/2018  . Diabetic ulcer of left foot ([place]) 04/18/2018  . Swelling of limb 04/06/2018  . Atherosclerosis of native arteries of the extremities with ulceration ([place]) 04/06/2018  . Edema of lower extremity 04/04/2018  . Diabetic foot infection ([place]) 03/01/2018  . Spinal cord stimulator status  01/12/2018  . Essential hypertension 09/05/2017  . OSA on CPAP 09/05/2017  . Uncontrolled type 2 diabetes mellitus with hyperglycemia ([place]) 08/02/2017  . Peripheral vascular disease ([place]) 08/02/2017  . Mixed hyperlipidemia 08/02/2017  . Dysuria 08/02/2017  . Obesity, Class III, BMI 40-49.9 (morbid obesity) ([place]) 08/10/2016  . Hemiparesis affecting left side as late effect of stroke ([place]) 05/09/2016  . Symptomatic bradycardia 03/21/2016  . Falls frequently 02/26/2016  . Acute renal failure (ARF) ([place]) 02/18/2016  . Hypotension 02/18/2016  . Sick sinus syndrome ([place]) 10/28/2015  . Syncopal episodes 10/26/2015  . Syncope 10/26/2015  . Adjustment disorder with mixed anxiety and depressed mood 10/20/2015  . Dysthymia 10/20/2015  . CVA (cerebral infarction) 10/19/2015  . Urinary retention 03/25/2015  . 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 Coronary artery disease due to lipid rich plaque 12/29/2014  . Acute MI, anterolateral wall, subsequent episode of care ([place]) 12/19/2014  . SIRS (systemic inflammatory response syndrome) ([place]) 12/18/2014  . Systemic inflammatory response syndrome (sirs) of non-infectious origin without acute organ dysfunction ([place]) 12/18/2014  . Encounter for other preprocedural examination 11/10/2014  . LVH (left ventricular hypertrophy) due to hypertensive disease, with heart failure ([place]) 10/02/2014  . Benign essential hypertension 09/26/2014  . Chronic diastolic CHF (congestive heart failure), NYHA class 2 ([place]) 06/03/2014  . Pain syndrome, chronic 03/02/2014  . Diabetes ([place]) 11/08/2013  .  Chronic radicular low back pain 08/21/2013  . Right shoulder pain 04/03/2013  . Urinary tract infection 03/16/2012  . Balanoposthitis 02/13/2012  . History of urinary stone 02/13/2012  . Hypertrophy of prostate with urinary obstruction and other lower urinary tract symptoms (LUTS) 02/13/2012  . 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 Redundant prepuce and phimosis 02/13/2012  . Urge  incontinence 02/13/2012  . Right foot pain 01/18/2012  . Chronic, continuous use of opioids 08/22/2011     Past Surgical History:  Procedure Laterality Date  . AMPUTATION TOE Left 04/19/2018   Procedure: AMPUTATION TOE-LEFT GREAT TOE;  Surgeon: Sharlotte Alamo, DPM;  Location: [facility] ORS;  Service: Podiatry;  Laterality: Left;  . CARDIAC CATHETERIZATION N/A 12/19/2014   Procedure: Coronary Stent Intervention;  Surgeon: Charolette Forward, MD;  Location: [place] [facility];  Service: Cardiovascular;  Laterality: N/A;  . CARDIAC CATHETERIZATION Left 12/19/2014   Procedure: Left Heart Cath and Coronary Angiography;  Surgeon: Dionisio Chibuikem, MD;  Location: [place] [facility];  Service: Cardiovascular;  Laterality: Left;  . IRRIGATION AND DEBRIDEMENT FOOT Left 03/02/2018   Procedure: IRRIGATION AND DEBRIDEMENT FOOT;  Surgeon: Samara Deist, DPM;  Location: [facility] ORS;  Service: Podiatry;  Laterality: Left;  . IRRIGATION AND DEBRIDEMENT FOOT Left 03/08/2018   Procedure: IRRIGATION AND DEBRIDEMENT FOOT AND BONE;  Surgeon: Sharlotte Alamo, DPM;  Location: [facility] OR[facility];  Service: Podiatry;  Laterality: Left;  . IRRIGATION AND DEBRIDEMENT FOOT Left 04/19/2018   Procedure: IRRIGATION AND DEBRIDEMENT FOOT;  Surgeon: Sharlotte Alamo, DPM;  Location: [facility] OR[facility];  Service: Podiatry;  Laterality: Left;  . LOWER EXTREMITY ANGIOGRAPHY Left 03/05/2018   Procedure: Lower Extremity Angiography;  Surgeon: Algernon Huxley, MD;  Location: [place] [facility];  Service: Cardiovascular;  Laterality: Left;  . LOWER EXTREMITY ANGIOGRAPHY Left 03/08/2018   Procedure: LOWER EXTREMITY ANGIOGRAPHY;  Surgeon: Algernon Huxley, MD;  Location: [place] [facility];  Service: Cardiovascular;  Laterality: Left;  . ORIF ANKLE FRACTURE Left 01/31/2019   Procedure: open reduction internal fixation left bimalleolar ankle fracture;  Surgeon: Thornton Park, MD;  Location: [facility] OR[facility];  Service: Orthopedics;  Laterality: Left;  . PACEMAKER INSERTION Left 11/02/2015    Procedure: INSERTION PACEMAKER;  Surgeon: Isaias Cowman, MD;  Location: [facility] ORS;  Service: Cardiovascular;  Laterality: Left;  . Pain Stimulator    . Right toe amputation       Prior to Admission medications   Medication Sig Start Date End Date Taking? Authorizing Provider  acetaminophen (TYLENOL) 325 MG tablet Take 1-2 tablets (325-650 mg total) by mouth every 6 (six) hours as needed for mild pain (pain score 1-3 or temp > 100.5). 02/07/19   Thornton Park, MD  amLODipine (NORVASC) 10 MG tablet TAKE 1 TABLET BY MOUTH ONCE DAILY 12/03/18   Kendell Bane, NP  aspirin EC 81 MG tablet Take 81 mg by mouth daily.    [provider]  benazepril (LOTENSIN) 20 MG tablet TAKE 1 TABLET BY MOUTH TWICE DAILY 02/27/19   Kendell Bane, NP  bisacodyl (DULCOLAX) 10 MG suppository Place 1 suppository (10 mg total) rectally daily as needed for moderate constipation. 02/07/19   Thornton Park, MD  cetirizine (ZYRTEC) 10 MG tablet Take 10 mg by mouth daily as needed for allergies.    [provider]  clopidogrel (PLAVIX) 75 MG tablet Take 1 tablet (75 mg total) by mouth daily. 06/28/18   Ronnell Freshwater, NP  docusate sodium (COLACE) 100 MG capsule Take 1 capsule (100 mg  total) by mouth 2 (two) times daily. 02/07/19   Thornton Park, MD  DULoxetine (CYMBALTA) 30 MG capsule Take 30-60 mg by mouth See admin instructions. 60 mg in the morning, 30 mg in the evening    [provider]  enoxaparin (LOVENOX) 40 MG/0.4ML injection Inject 0.4 mLs (40 mg total) into the skin every 12 (twelve) hours for 14 days. 02/07/19 02/21/19  Thornton Park, MD  Exenatide ER (BYDUREON) 2 MG PEN Inject 2 mg into the skin once a week. 11/26/18   Kendell Bane, NP  ezetimibe (ZETIA) 10 MG tablet Take 10 mg by mouth daily.  10/29/18   [provider]  fenofibrate 54 MG tablet TAKE 1 TABLET BY MOUTH A DAY AT SUPPERTIME 12/03/18   Kendell Bane, NP  fluticasone (FLONASE) 50 MCG/ACT nasal  spray Place 2 sprays into both nostrils daily as needed for allergies.     [provider]  furosemide (LASIX) 20 MG tablet Take 20 mg by mouth daily. 10/01/18   [provider]  gemfibrozil (LOPID) 600 MG tablet Take 1 tablet (600 mg total) by mouth 2 (two) times daily before a meal. 12/12/18   [place]ar[place], Audie Clear, NP  glucose blood (TRUE METRIX BLOOD GLUCOSE TEST) test strip Use as instructed three times a day diag e11.65 08/13/18   Ronnell Freshwater, NP  hydrochlorothiazide (HYDRODIURIL) 12.5 MG tablet Take 1 tablet (12.5 mg total) by mouth daily. 01/31/19   Ronnell Freshwater, NP  Insulin Glargine (LANTUS SOLOSTAR) 100 UNIT/ML Solostar Pen Inject 71 Units into the skin at bedtime. 01/07/19   Kendell Bane, NP  Insulin Pen Needle (B-D UF III MINI PEN NEEDLES) 31G X 5 MM MISC Use as directed with insulin e11.65 12/26/17   Ronnell Freshwater, NP  insulin regular (NOVOLIN R) 100 units/mL injection Patient to use Novolin R [place] TiD prior to meals per sliding scale instructions. Max daily dose is 45 units per day. 06/22/18   Kendell Bane, NP  INSULIN SYRINGE 1CC/29G 29G X 1/2" 1 ML MISC Insulin injections QID, use with lantus and regular insulin. DX E11.65 07/05/18   Kendell Bane, NP  Insulin Syringe-Needle U-100 (BD INSULIN SYRINGE U/F 1/2UNIT) 31G X 5/16" 0.3 ML MISC Indulin injection QID. Use with lantus and regular insulin Dx. 11.65 01/08/18   Lavera Guise, MD  lidocaine (LIDODERM) 5 % Place 1 patch onto the skin daily as needed (pain). Remove & Discard patch within 12 hours or as directed by MD    [provider]  NARCAN 4 MG/0.1ML LIQD Take 4 mg by mouth as needed (for opioid overdose).     [provider]  omega-3 acid ethyl esters (LOVAZA) 1 g capsule Take 1 capsule (1 g total) by mouth 2 (two) times daily. 01/24/19   Kendell Bane, NP  Oxcarbazepine (TRILEPTAL) 300 MG tablet Take 300-600 mg by mouth See admin instructions. Take 1 tablet (300MG ) by mouth every  morning and 2 tablets (600MG ) by mouth every night    [provider]  oxyCODONE (OXY IR/ROXICODONE) 5 MG immediate release tablet Take 1-2 tablets (5-10 mg total) by mouth every 4 (four) hours as needed for moderate pain (pain score 4-6). 02/07/19   Thornton Park, MD  oxyCODONE (OXYCONTIN) 20 mg 12 hr tablet Take 1 tablet (20 mg total) by mouth every 12 (twelve) hours. 02/07/19   Thornton Park, MD  pantoprazole (PROTONIX) 40 MG tablet TAKE 1 TABLET BY MOUTH ONCE DAILY FOR REFLUX  02/01/19   Kendell Bane, NP  polyethylene glycol (MIRALAX / GLYCOLAX) packet Take 17 g by mouth daily.     [provider]  pregabalin (LYRICA) 200 MG capsule Take 200-400 mg by mouth See admin instructions. 400 mg in the morning, 200 mg in the evening    [provider]  tamsulosin (FLOMAX) 0.4 MG CAPS capsule TAKE 1 CAPSULE BY MOUTH ONCE DAILY AFTERSUPPER 02/01/19   Kendell Bane, NP     Allergies Amoxicillin, Other, Penicillins, Hydralazine, Cephalexin, Clindamycin, Crestor [rosuvastatin calcium], Metoprolol, Red dye, Statins, Sulfa antibiotics, Yellow dyes (non-tartrazine), Aspirin, and Tape   Family History  Problem Relation Age of Onset  . Breast cancer Mother   . Cancer Mother   . Hypertension Mother   . Lung cancer Father   . Hypertension Father   . Heart disease Father   . Cancer Father   . Kidney disease Sister   . Prostate cancer Neg Hx     Social History Social History   Tobacco Use  . Smoking status: Former Smoker    Types: Cigarettes  . Smokeless tobacco: Never Used  . Tobacco comment: quit 45 years  Substance Use Topics  . Alcohol use: Not Currently    Alcohol/week: 0.0 standard drinks    Comment: have not had alcohol in 81yrs  . Drug use: No    Review of Systems  Constitutional:   No fever or chills.  ENT:   No sore throat. No rhinorrhea. Cardiovascular:   No chest pain or syncope. Respiratory:   No dyspnea or cough. Gastrointestinal:    Negative for abdominal pain, vomiting and diarrhea.  Musculoskeletal:   Chronic left leg pain as above All other systems reviewed and are negative except as documented above in ROS and HPI.  ____________________________________________   PHYSICAL EXAM:  VITAL SIGNS: ED Triage Vitals  Enc Vitals Group     BP 03/24/19 1629 112/62     Pulse Rate 03/24/19 1624 71     Resp 03/24/19 1624 19     Temp 03/24/19 1628 97.9 F (36.6 C)     Temp Source 03/24/19 1628 Oral     SpO2 03/24/19 1624 98 %     Weight 03/24/19 1625 271 lb 2.7 oz (123 kg)     Height 03/24/19 1625 5\' 7"  (1.702 m)     Head Circumference --      [facility] --      Pain Score 03/24/19 1625 8     Pain Loc --      Pain Edu? --      Excl. in [place]? --     Vital signs reviewed, nursing assessments reviewed.   Constitutional:   Alert and oriented. Non-toxic appearance.  Morbidly obese Eyes:   Conjunctivae are normal. EOMI. PERRL. ENT      Head:   Normocephalic and atraumatic.      Nose:   Normal      Mouth/Throat:   Dry mucous membranes      Neck:   No meningismus. Full ROM. Hematological/Lymphatic/Immunilogical:   No cervical lymphadenopathy. Cardiovascular:   RRR. Symmetric bilateral radial and DP pulses.  No murmurs. Cap refill less than 2 seconds. Respiratory:   Normal respiratory effort without tachypnea/retractions. Breath sounds are clear and equal bilaterally. No wheezes/rales/rhonchi. Gastrointestinal:   Soft and nontender. Non distended. There is no CVA tenderness.  No rebound, rigidity, or guarding.  Musculoskeletal:   Normal range of motion in all extremities. No joint effusions.  No lower extremity tenderness.  No edema.  No calf tenderness.  Symmetric calf circumference.  Negative Homans' sign.  Left foot is status post first toe amputation.  There is an Ace wrap in place, no inflammatory skin changes or other signs of infection.  No crepitus or fluctuance.  No wounds. Neurologic:   Normal speech and  language.  Motor grossly intact. No acute focal neurologic deficits are appreciated.  Skin:    Skin is warm, dry and intact. No rash noted.  No petechiae, purpura, or bullae.  ____________________________________________    LABS (pertinent positives/negatives) (all labs ordered are listed, but only abnormal results are displayed) Labs Reviewed  COMPREHENSIVE METABOLIC PANEL - Abnormal; Notable for the following components:      Result Value   Glucose, Bld 200 (*)    BUN 30 (*)    Albumin 2.8 (*)    All other components within normal limits  CBC WITH DIFFERENTIAL/PLATELET - Abnormal; Notable for the following components:   RBC 3.48 (*)    Hemoglobin 8.7 (*)    HCT 27.2 (*)    MCV 78.2 (*)    MCH 25.0 (*)    RDW 15.9 (*)    Neutro Abs 8.3 (*)    Abs Immature Granulocytes 0.10 (*)    All other components within normal limits  LIPASE, BLOOD   ____________________________________________   EKG  Interpreted by me Sinus rhythm rate of 60, normal axis and intervals.  Poor R wave progression.  Normal ST segments and T waves.  2 PVCs on the strip.  ____________________________________________    RADIOLOGY  No results found.  ____________________________________________   PROCEDURES Procedures  ____________________________________________  DIFFERENTIAL DIAGNOSIS   Dehydration, electrolyte abnormality  CLINICAL IMPRESSION / ASSESSMENT AND PLAN / ED COURSE  Medications ordered in the ED: Medications  sodium chloride 0.9 % bolus 500 mL (500 mLs Intravenous New Bag/Given 03/24/19 1726)    Pertinent labs & imaging results that were available during my care of the patient were reviewed by me and considered in my medical decision making (see chart for details).  Victor Wise was evaluated in Emergency Department on 03/24/2019 for the symptoms described in the history of present illness. He was evaluated in the context of the global COVID-19 pandemic, which necessitated  consideration that the patient might be at risk for infection with the SARS-CoV-2 virus that causes COVID-19. Institutional protocols and algorithms that pertain to the evaluation of patients at risk for COVID-19 are in a state of rapid change based on information released by regulatory bodies including the CDC and federal and state organizations. These policies and algorithms were followed during the patient's care in the ED.   Patient sent to the ED for fatigue.  Exam is reassuring, vital signs are normal.  Doubt DVT or infection.  Labs are reassuring with stable creatinine, electrolytes.  Hemoglobin is stable.  Patient was given IV fluids for hydration, plan to discharge back to [facility] for further management.      ____________________________________________   FINAL CLINICAL IMPRESSION(S) / ED DIAGNOSES    Final diagnoses:  Fatigue, unspecified type     ED Discharge Orders    None      Portions of this note were generated with dragon dictation software. Dictation errors may occur despite best attempts at proofreading.   Carrie Mew, MD 03/24/19 [id number]

## 2019-03-24 NOTE — Discharge Instructions (Addendum)
Your labs and vital signs today were generally reassuring.  Please follow-up with your doctor to continue monitoring your symptoms.  Continue taking all home medications.

## 2019-03-24 NOTE — ED Notes (Signed)
Called [facility] for transport to [place]

## 2019-03-24 NOTE — ED Triage Notes (Signed)
Pt arrives via EMS from [facility] for weakness- pt was here yesterday for leg pain- left ankle still wrapped in ace wrap- per facility pt was difficult to arouse- pt is alert and oriented here- pt complaining of leg pain

## 2019-03-25 DIAGNOSIS — S42402D Unspecified fracture of lower end of left humerus, subsequent encounter for fracture with routine healing: Secondary | ICD-10-CM | POA: Diagnosis not present

## 2019-03-26 ENCOUNTER — Other Ambulatory Visit: Payer: Self-pay

## 2019-03-26 ENCOUNTER — Encounter: Payer: Self-pay | Admitting: Plastic Surgery

## 2019-03-26 ENCOUNTER — Ambulatory Visit (INDEPENDENT_AMBULATORY_CARE_PROVIDER_SITE_OTHER): Payer: Medicare HMO | Admitting: Plastic Surgery

## 2019-03-26 VITALS — BP 108/70 | HR 60 | Temp 97.1°F

## 2019-03-26 DIAGNOSIS — S82842A Displaced bimalleolar fracture of left lower leg, initial encounter for closed fracture: Secondary | ICD-10-CM

## 2019-03-26 DIAGNOSIS — L089 Local infection of the skin and subcutaneous tissue, unspecified: Secondary | ICD-10-CM | POA: Diagnosis not present

## 2019-03-26 DIAGNOSIS — E1165 Type 2 diabetes mellitus with hyperglycemia: Secondary | ICD-10-CM

## 2019-03-26 DIAGNOSIS — Z9689 Presence of other specified functional implants: Secondary | ICD-10-CM | POA: Diagnosis not present

## 2019-03-26 DIAGNOSIS — S81802A Unspecified open wound, left lower leg, initial encounter: Secondary | ICD-10-CM | POA: Diagnosis not present

## 2019-03-26 DIAGNOSIS — E11628 Type 2 diabetes mellitus with other skin complications: Secondary | ICD-10-CM | POA: Diagnosis not present

## 2019-03-26 DIAGNOSIS — R600 Localized edema: Secondary | ICD-10-CM

## 2019-03-26 NOTE — Progress Notes (Signed)
Patient ID: Victor Wise, male    DOB: Mar 11, 1951, 68 y.o.   MRN: [id number]   Chief Complaint  Patient presents with  . Advice Only    for (L) ankle wound  . Skin Problem    The patient is a 68 year old male here with his transport from his nursing facility.  He has an open wound of the left lower leg.  He had a fracture in September and underwent open reduction internal fixation.  He went into boot.  When he was seen in follow-up he was noted to have wound on the lower leg.  He complains of pain and drainage.  He has multiple medical issues and acute including heart disease with a recent heart attack, diabetes, reflux and obstructive sleep apnea, history of stroke and has a history of a spinal cord stimulator placed.  The hardware has been in for approximately 7 weeks and needs to be in for at least 6 weeks or longer.  His last hemoglobin A1c was 8.7 which was 1 month ago.  He had an infection of his toes approximately a year ago and underwent amputation of his great toe on the same foot.  The wound is located on the lateral aspect of his left leg and involves approximately a 1.5 x 10 cm area.  There is some purulent drainage.   Review of Systems  Constitutional: Positive for activity change. Negative for appetite change.  Eyes: Negative.   Respiratory: Negative for chest tightness.   Cardiovascular: Positive for leg swelling.  Gastrointestinal: Negative for abdominal pain.  Endocrine: Negative.   Genitourinary: Negative.   Musculoskeletal: Positive for gait problem.  Skin: Positive for color change and wound.  Neurological: Positive for syncope and weakness.  Psychiatric/Behavioral: The patient is nervous/anxious.     Past Medical History:  Diagnosis Date  . Allergy   . Basal cell carcinoma    forehead  . BPH (benign prostatic hyperplasia)   . CHF (congestive heart failure) ([place])   . Chronic back pain   . Coronary artery disease   . Depression   . Diabetes ([place])   . GERD  (gastroesophageal reflux disease)   . Heart attack ([place])    2018  . Hypertension   . Morbid obesity ([place])   . Obstructive sleep apnea   . Osteomyelitis of foot ([place])   . Status post insertion of spinal cord stimulator   . Stroke [facility])    last one 2-3 years ago  . UTI (lower urinary tract infection)     Past Surgical History:  Procedure Laterality Date  . AMPUTATION TOE Left 04/19/2018   Procedure: AMPUTATION TOE-LEFT GREAT TOE;  Surgeon: Sharlotte Alamo, DPM;  Location: [facility];  Service: Podiatry;  Laterality: Left;  . CARDIAC CATHETERIZATION N/A 12/19/2014   Procedure: Coronary Stent Intervention;  Surgeon: Charolette Forward, MD;  Location: [place] [facility];  Service: Cardiovascular;  Laterality: N/A;  . CARDIAC CATHETERIZATION Left 12/19/2014   Procedure: Left Heart Cath and Coronary Angiography;  Surgeon: Dionisio Jovahn, MD;  Location: [place] [facility];  Service: Cardiovascular;  Laterality: Left;  . IRRIGATION AND DEBRIDEMENT FOOT Left 03/02/2018   Procedure: IRRIGATION AND DEBRIDEMENT FOOT;  Surgeon: Samara Deist, DPM;  Location: [facility];  Service: Podiatry;  Laterality: Left;  . IRRIGATION AND DEBRIDEMENT FOOT Left 03/08/2018   Procedure: IRRIGATION AND DEBRIDEMENT FOOT AND BONE;  Surgeon: Sharlotte Alamo, DPM;  Location: [facility];  Service: Podiatry;  Laterality: Left;  . IRRIGATION  AND DEBRIDEMENT FOOT Left 04/19/2018   Procedure: IRRIGATION AND DEBRIDEMENT FOOT;  Surgeon: Sharlotte Alamo, DPM;  Location: [facility];  Service: Podiatry;  Laterality: Left;  . LOWER EXTREMITY ANGIOGRAPHY Left 03/05/2018   Procedure: Lower Extremity Angiography;  Surgeon: Algernon Huxley, MD;  Location: [place] [facility];  Service: Cardiovascular;  Laterality: Left;  . LOWER EXTREMITY ANGIOGRAPHY Left 03/08/2018   Procedure: LOWER EXTREMITY ANGIOGRAPHY;  Surgeon: Algernon Huxley, MD;  Location: [place] [facility];  Service: Cardiovascular;  Laterality: Left;  . ORIF ANKLE FRACTURE Left 01/31/2019   Procedure:  open reduction internal fixation left bimalleolar ankle fracture;  Surgeon: Thornton Park, MD;  Location: [facility];  Service: Orthopedics;  Laterality: Left;  . PACEMAKER INSERTION Left 11/02/2015   Procedure: INSERTION PACEMAKER;  Surgeon: Isaias Cowman, MD;  Location: [facility];  Service: Cardiovascular;  Laterality: Left;  . Pain Stimulator    . Right toe amputation        Current Outpatient Medications:  .  acetaminophen (TYLENOL) 325 MG tablet, Take 1-2 tablets (325-650 mg total) by mouth every 6 (six) hours as needed for mild pain (pain score 1-3 or temp > 100.5)., Disp: 60 tablet, Rfl: 1 .  amLODipine (NORVASC) 10 MG tablet, TAKE 1 TABLET BY MOUTH ONCE DAILY, Disp: 30 tablet, Rfl: 3 .  aspirin EC 81 MG tablet, Take 81 mg by mouth daily., Disp: , Rfl:  .  benazepril (LOTENSIN) 20 MG tablet, TAKE 1 TABLET BY MOUTH TWICE DAILY, Disp: 60 tablet, Rfl: 2 .  bisacodyl (DULCOLAX) 10 MG suppository, Place 1 suppository (10 mg total) rectally daily as needed for moderate constipation., Disp: 12 suppository, Rfl: 0 .  cetirizine (ZYRTEC) 10 MG tablet, Take 10 mg by mouth daily as needed for allergies., Disp: , Rfl:  .  clopidogrel (PLAVIX) 75 MG tablet, Take 1 tablet (75 mg total) by mouth daily., Disp: 30 tablet, Rfl: 11 .  docusate sodium (COLACE) 100 MG capsule, Take 1 capsule (100 mg total) by mouth 2 (two) times daily., Disp: 10 capsule, Rfl: 0 .  DULoxetine (CYMBALTA) 30 MG capsule, Take 30-60 mg by mouth See admin instructions. 60 mg in the morning, 30 mg in the evening, Disp: , Rfl:  .  Exenatide ER (BYDUREON) 2 MG PEN, Inject 2 mg into the skin once a week., Disp: 4 each, Rfl: 5 .  ezetimibe (ZETIA) 10 MG tablet, Take 10 mg by mouth daily. , Disp: , Rfl:  .  fenofibrate 54 MG tablet, TAKE 1 TABLET BY MOUTH A DAY AT SUPPERTIME, Disp: 30 tablet, Rfl: 5 .  fluticasone (FLONASE) 50 MCG/ACT nasal spray, Place 2 sprays into both nostrils daily as needed for allergies. , Disp: , Rfl:  .   furosemide (LASIX) 20 MG tablet, Take 20 mg by mouth daily., Disp: , Rfl:  .  gemfibrozil (LOPID) 600 MG tablet, Take 1 tablet (600 mg total) by mouth 2 (two) times daily before a meal., Disp: 60 tablet, Rfl: 2 .  glucose blood (TRUE METRIX BLOOD GLUCOSE TEST) test strip, Use as instructed three times a day diag e11.65, Disp: 100 each, Rfl: 3 .  hydrochlorothiazide (HYDRODIURIL) 12.5 MG tablet, Take 1 tablet (12.5 mg total) by mouth daily., Disp: 30 tablet, Rfl: 5 .  Insulin Glargine (LANTUS SOLOSTAR) 100 UNIT/ML Solostar Pen, Inject 71 Units into the skin at bedtime., Disp: 15 mL, Rfl: 6 .  Insulin Pen Needle (B-D UF III MINI PEN NEEDLES) 31G X 5 MM MISC, Use as directed  with insulin e11.65, Disp: 100 each, Rfl: 3 .  insulin regular (NOVOLIN R) 100 units/mL injection, Patient to use Novolin R [place] TiD prior to meals per sliding scale instructions. Max daily dose is 45 units per day., Disp: 10 mL, Rfl: 11 .  INSULIN SYRINGE 1CC/29G 29G X 1/2" 1 ML MISC, Insulin injections QID, use with lantus and regular insulin. DX E11.65, Disp: 120 each, Rfl: 5 .  Insulin Syringe-Needle U-100 (BD INSULIN SYRINGE U/F 1/2UNIT) 31G X 5/16" 0.3 ML MISC, Indulin injection QID. Use with lantus and regular insulin Dx. 11.65, Disp: 400 each, Rfl: 3 .  lidocaine (LIDODERM) 5 %, Place 1 patch onto the skin daily as needed (pain). Remove & Discard patch within 12 hours or as directed by MD, Disp: , Rfl:  .  NARCAN 4 MG/0.1ML LIQD, Take 4 mg by mouth as needed (for opioid overdose). , Disp: , Rfl:  .  omega-3 acid ethyl esters (LOVAZA) 1 g capsule, Take 1 capsule (1 g total) by mouth 2 (two) times daily., Disp: 60 capsule, Rfl: 2 .  Oxcarbazepine (TRILEPTAL) 300 MG tablet, Take 300-600 mg by mouth See admin instructions. Take 1 tablet (300MG ) by mouth every morning and 2 tablets (600MG ) by mouth every night, Disp: , Rfl:  .  oxyCODONE (OXY IR/ROXICODONE) 5 MG immediate release tablet, Take 1-2 tablets (5-10 mg total) by mouth every  4 (four) hours as needed for moderate pain (pain score 4-6)., Disp: 30 tablet, Rfl: 0 .  oxyCODONE (OXYCONTIN) 20 mg 12 hr tablet, Take 1 tablet (20 mg total) by mouth every 12 (twelve) hours., Disp: 14 tablet, Rfl: 0 .  pantoprazole (PROTONIX) 40 MG tablet, TAKE 1 TABLET BY MOUTH ONCE DAILY FOR REFLUX, Disp: 30 tablet, Rfl: 3 .  polyethylene glycol (MIRALAX / GLYCOLAX) packet, Take 17 g by mouth daily. , Disp: , Rfl:  .  pregabalin (LYRICA) 200 MG capsule, Take 200-400 mg by mouth See admin instructions. 400 mg in the morning, 200 mg in the evening, Disp: , Rfl:  .  tamsulosin (FLOMAX) 0.4 MG CAPS capsule, TAKE 1 CAPSULE BY MOUTH ONCE DAILY AFTERSUPPER, Disp: 30 capsule, Rfl: 3 .  enoxaparin (LOVENOX) 40 MG/0.4ML injection, Inject 0.4 mLs (40 mg total) into the skin every 12 (twelve) hours for 14 days., Disp: 11.2 mL, Rfl: 0   Objective:   Vitals:   03/26/19 1123  BP: 108/70  Pulse: (!) 58  Temp: (!) 97.1 F (36.2 C)  SpO2: 96%    Physical Exam Vitals signs and nursing note reviewed.  Constitutional:      Appearance: Normal appearance.  HENT:     Head: Normocephalic and atraumatic.  Cardiovascular:     Rate and Rhythm: Normal rate.     Pulses: Normal pulses.  Pulmonary:     Effort: Pulmonary effort is normal.  Musculoskeletal:        General: Swelling present.  Skin:    Findings: Bruising and erythema present.  Neurological:     Mental Status: He is alert. Mental status is at baseline.  Psychiatric:        Mood and Affect: Mood normal.     Assessment & Plan:  Diabetic foot infection ([place])  Uncontrolled type 2 diabetes mellitus with hyperglycemia ([place])  Closed bimalleolar fracture of left ankle, initial encounter  Bilateral leg edema  Spinal cord stimulator status  Leg wound, left, initial encounter  Recommend debridement of left leg with ACell and VAC placement.  This is likely a very difficult  situation.  The patient has very poor health at this time.  There is  chance that this may lead to an amputation of that leg.  We will do everything we can to try and salvage this.  The hardware is needed for another 6 weeks at a minimum. I have reviewed his notes from the past several months.  I also talk with Dr. Mack Guise and discussed the above information. Pictures were obtained of the patient and placed in the chart with the patient's or guardian's permission.   [place], DO

## 2019-03-29 DIAGNOSIS — I2510 Atherosclerotic heart disease of native coronary artery without angina pectoris: Secondary | ICD-10-CM | POA: Diagnosis not present

## 2019-03-29 DIAGNOSIS — I10 Essential (primary) hypertension: Secondary | ICD-10-CM | POA: Diagnosis not present

## 2019-03-29 DIAGNOSIS — I5032 Chronic diastolic (congestive) heart failure: Secondary | ICD-10-CM | POA: Diagnosis not present

## 2019-03-29 DIAGNOSIS — I110 Hypertensive heart disease with heart failure: Secondary | ICD-10-CM | POA: Diagnosis not present

## 2019-04-02 DIAGNOSIS — F5101 Primary insomnia: Secondary | ICD-10-CM | POA: Diagnosis not present

## 2019-04-02 DIAGNOSIS — I10 Essential (primary) hypertension: Secondary | ICD-10-CM | POA: Diagnosis not present

## 2019-04-02 DIAGNOSIS — S82842D Displaced bimalleolar fracture of left lower leg, subsequent encounter for closed fracture with routine healing: Secondary | ICD-10-CM | POA: Diagnosis not present

## 2019-04-02 DIAGNOSIS — K219 Gastro-esophageal reflux disease without esophagitis: Secondary | ICD-10-CM | POA: Diagnosis not present

## 2019-04-02 DIAGNOSIS — Z794 Long term (current) use of insulin: Secondary | ICD-10-CM | POA: Diagnosis not present

## 2019-04-02 DIAGNOSIS — D649 Anemia, unspecified: Secondary | ICD-10-CM | POA: Diagnosis not present

## 2019-04-02 DIAGNOSIS — G464 Cerebellar stroke syndrome: Secondary | ICD-10-CM | POA: Diagnosis not present

## 2019-04-02 DIAGNOSIS — F1110 Opioid abuse, uncomplicated: Secondary | ICD-10-CM | POA: Diagnosis not present

## 2019-04-02 DIAGNOSIS — E119 Type 2 diabetes mellitus without complications: Secondary | ICD-10-CM | POA: Diagnosis not present

## 2019-04-02 DIAGNOSIS — I739 Peripheral vascular disease, unspecified: Secondary | ICD-10-CM | POA: Diagnosis not present

## 2019-04-02 DIAGNOSIS — F331 Major depressive disorder, recurrent, moderate: Secondary | ICD-10-CM | POA: Diagnosis not present

## 2019-04-02 DIAGNOSIS — M5489 Other dorsalgia: Secondary | ICD-10-CM | POA: Diagnosis not present

## 2019-04-02 DIAGNOSIS — M6281 Muscle weakness (generalized): Secondary | ICD-10-CM | POA: Diagnosis not present

## 2019-04-02 DIAGNOSIS — F329 Major depressive disorder, single episode, unspecified: Secondary | ICD-10-CM | POA: Diagnosis not present

## 2019-04-05 DIAGNOSIS — L89610 Pressure ulcer of right heel, unstageable: Secondary | ICD-10-CM | POA: Diagnosis not present

## 2019-04-05 DIAGNOSIS — L98412 Non-pressure chronic ulcer of buttock with fat layer exposed: Secondary | ICD-10-CM | POA: Diagnosis not present

## 2019-04-08 ENCOUNTER — Telehealth: Payer: Self-pay

## 2019-04-08 ENCOUNTER — Other Ambulatory Visit: Payer: Self-pay

## 2019-04-08 ENCOUNTER — Other Ambulatory Visit
Admission: RE | Admit: 2019-04-08 | Discharge: 2019-04-08 | Disposition: A | Discharge status: Home / Self Care | Payer: Medicare HMO | Source: Ambulatory Visit | Attending: Plastic Surgery | Admitting: Plastic Surgery

## 2019-04-08 DIAGNOSIS — Z01812 Encounter for preprocedural laboratory examination: Secondary | ICD-10-CM | POA: Insufficient documentation

## 2019-04-08 DIAGNOSIS — S42402D Unspecified fracture of lower end of left humerus, subsequent encounter for fracture with routine healing: Secondary | ICD-10-CM | POA: Diagnosis not present

## 2019-04-08 DIAGNOSIS — Z20828 Contact with and (suspected) exposure to other viral communicable diseases: Secondary | ICD-10-CM | POA: Insufficient documentation

## 2019-04-08 LAB — SARS CORONAVIRUS 2 (TAT 6-24 HRS): SARS Coronavirus 2: NEGATIVE

## 2019-04-08 NOTE — Telephone Encounter (Signed)
Call to [facility] Address: [place], [place] [address] Ph# [phone]  /  Fax# [phone] Spoke to Maudie Mercury, RN- taking care of Victor Wise, Informed her that pt has an appointment with Dr. Claudia Desanctis on Wed. 04/10/2019 @ 11am for evaluation of surgical plan & discuss options  Surgery is scheduled for 04/15/2019 @ 8:30am with Dr. Claudia Desanctis @ [facility] main OR Pt is to arrive @ 5:30am - per [facility] (surgery scheduler/approval)- pt can have a rapid Covid test that am on arrival.  Maudie Mercury is aware of this schedule & she will inform pt & his family Maudie Mercury verified that  pt will have transportation provided by the facility for both the office visit here & surgery Per Maudie Mercury- I faxed orders for the wound vac ([employer])-to her attention at the facility She will coordinate the vac supply & will have pt bring vac with him on day of surgery for placement Maudie Mercury also verified that they have an in-house wound care nurse who will provide wound/vac & dressing care in the facility & we do not need to arrange this. [facility] has our ph/fax #'s & they will call for any questions or concerns [place]

## 2019-04-08 NOTE — Telephone Encounter (Signed)
Call to [facility] with [employer]- to verify that [facility] facility does order & provide wound vac care in-house & she will contact the facility to coordinate the vac supplies as needed  [facility]

## 2019-04-10 ENCOUNTER — Other Ambulatory Visit: Payer: Self-pay

## 2019-04-10 ENCOUNTER — Encounter: Payer: Self-pay | Admitting: Emergency Medicine

## 2019-04-10 ENCOUNTER — Inpatient Hospital Stay
Admission: EM | Admit: 2019-04-10 | Discharge: 2019-04-23 | DRG: 857 | Disposition: A | Discharge status: Other Acute Inpatient Hospital | Payer: Medicare HMO | Attending: Orthopedic Surgery | Admitting: Orthopedic Surgery

## 2019-04-10 ENCOUNTER — Emergency Department: Payer: Medicare HMO

## 2019-04-10 ENCOUNTER — Institutional Professional Consult (permissible substitution): Payer: Medicare HMO | Admitting: Plastic Surgery

## 2019-04-10 DIAGNOSIS — T8579XA Infection and inflammatory reaction due to other internal prosthetic devices, implants and grafts, initial encounter: Secondary | ICD-10-CM | POA: Diagnosis not present

## 2019-04-10 DIAGNOSIS — I2510 Atherosclerotic heart disease of native coronary artery without angina pectoris: Secondary | ICD-10-CM | POA: Diagnosis present

## 2019-04-10 DIAGNOSIS — S82832A Other fracture of upper and lower end of left fibula, initial encounter for closed fracture: Secondary | ICD-10-CM | POA: Diagnosis not present

## 2019-04-10 DIAGNOSIS — Z7902 Long term (current) use of antithrombotics/antiplatelets: Secondary | ICD-10-CM

## 2019-04-10 DIAGNOSIS — N400 Enlarged prostate without lower urinary tract symptoms: Secondary | ICD-10-CM

## 2019-04-10 DIAGNOSIS — T847XXA Infection and inflammatory reaction due to other internal orthopedic prosthetic devices, implants and grafts, initial encounter: Secondary | ICD-10-CM

## 2019-04-10 DIAGNOSIS — B9561 Methicillin susceptible Staphylococcus aureus infection as the cause of diseases classified elsewhere: Secondary | ICD-10-CM | POA: Diagnosis not present

## 2019-04-10 DIAGNOSIS — Z881 Allergy status to other antibiotic agents status: Secondary | ICD-10-CM | POA: Diagnosis not present

## 2019-04-10 DIAGNOSIS — Z87442 Personal history of urinary calculi: Secondary | ICD-10-CM

## 2019-04-10 DIAGNOSIS — Z880 Allergy status to penicillin: Secondary | ICD-10-CM | POA: Diagnosis not present

## 2019-04-10 DIAGNOSIS — Z01818 Encounter for other preprocedural examination: Secondary | ICD-10-CM | POA: Diagnosis not present

## 2019-04-10 DIAGNOSIS — S82842P Displaced bimalleolar fracture of left lower leg, subsequent encounter for closed fracture with malunion: Secondary | ICD-10-CM

## 2019-04-10 DIAGNOSIS — D649 Anemia, unspecified: Secondary | ICD-10-CM | POA: Diagnosis not present

## 2019-04-10 DIAGNOSIS — F331 Major depressive disorder, recurrent, moderate: Secondary | ICD-10-CM | POA: Diagnosis present

## 2019-04-10 DIAGNOSIS — Z89412 Acquired absence of left great toe: Secondary | ICD-10-CM | POA: Diagnosis not present

## 2019-04-10 DIAGNOSIS — I509 Heart failure, unspecified: Secondary | ICD-10-CM | POA: Diagnosis not present

## 2019-04-10 DIAGNOSIS — I110 Hypertensive heart disease with heart failure: Secondary | ICD-10-CM | POA: Diagnosis not present

## 2019-04-10 DIAGNOSIS — N390 Urinary tract infection, site not specified: Secondary | ICD-10-CM | POA: Diagnosis not present

## 2019-04-10 DIAGNOSIS — Z20828 Contact with and (suspected) exposure to other viral communicable diseases: Secondary | ICD-10-CM | POA: Diagnosis present

## 2019-04-10 DIAGNOSIS — I2583 Coronary atherosclerosis due to lipid rich plaque: Secondary | ICD-10-CM | POA: Diagnosis present

## 2019-04-10 DIAGNOSIS — Y792 Prosthetic and other implants, materials and accessory orthopedic devices associated with adverse incidents: Secondary | ICD-10-CM | POA: Diagnosis present

## 2019-04-10 DIAGNOSIS — K5900 Constipation, unspecified: Secondary | ICD-10-CM | POA: Diagnosis not present

## 2019-04-10 DIAGNOSIS — Z7982 Long term (current) use of aspirin: Secondary | ICD-10-CM

## 2019-04-10 DIAGNOSIS — Z6841 Body Mass Index (BMI) 40.0 and over, adult: Secondary | ICD-10-CM

## 2019-04-10 DIAGNOSIS — E1165 Type 2 diabetes mellitus with hyperglycemia: Secondary | ICD-10-CM | POA: Diagnosis not present

## 2019-04-10 DIAGNOSIS — L97409 Non-pressure chronic ulcer of unspecified heel and midfoot with unspecified severity: Secondary | ICD-10-CM | POA: Diagnosis present

## 2019-04-10 DIAGNOSIS — I5032 Chronic diastolic (congestive) heart failure: Secondary | ICD-10-CM | POA: Diagnosis not present

## 2019-04-10 DIAGNOSIS — G8929 Other chronic pain: Secondary | ICD-10-CM | POA: Diagnosis present

## 2019-04-10 DIAGNOSIS — R197 Diarrhea, unspecified: Secondary | ICD-10-CM | POA: Diagnosis present

## 2019-04-10 DIAGNOSIS — Z9989 Dependence on other enabling machines and devices: Secondary | ICD-10-CM

## 2019-04-10 DIAGNOSIS — Z8631 Personal history of diabetic foot ulcer: Secondary | ICD-10-CM | POA: Diagnosis not present

## 2019-04-10 DIAGNOSIS — E119 Type 2 diabetes mellitus without complications: Secondary | ICD-10-CM | POA: Diagnosis not present

## 2019-04-10 DIAGNOSIS — I69354 Hemiplegia and hemiparesis following cerebral infarction affecting left non-dominant side: Secondary | ICD-10-CM

## 2019-04-10 DIAGNOSIS — S82842D Displaced bimalleolar fracture of left lower leg, subsequent encounter for closed fracture with routine healing: Secondary | ICD-10-CM | POA: Diagnosis not present

## 2019-04-10 DIAGNOSIS — Z886 Allergy status to analgesic agent status: Secondary | ICD-10-CM | POA: Diagnosis not present

## 2019-04-10 DIAGNOSIS — S82842A Displaced bimalleolar fracture of left lower leg, initial encounter for closed fracture: Secondary | ICD-10-CM | POA: Diagnosis present

## 2019-04-10 DIAGNOSIS — Z1623 Resistance to quinolones and fluoroquinolones: Secondary | ICD-10-CM | POA: Diagnosis present

## 2019-04-10 DIAGNOSIS — M170 Bilateral primary osteoarthritis of knee: Secondary | ICD-10-CM | POA: Diagnosis present

## 2019-04-10 DIAGNOSIS — I252 Old myocardial infarction: Secondary | ICD-10-CM | POA: Diagnosis not present

## 2019-04-10 DIAGNOSIS — Z794 Long term (current) use of insulin: Secondary | ICD-10-CM | POA: Diagnosis not present

## 2019-04-10 DIAGNOSIS — R296 Repeated falls: Secondary | ICD-10-CM | POA: Diagnosis present

## 2019-04-10 DIAGNOSIS — L8990 Pressure ulcer of unspecified site, unspecified stage: Secondary | ICD-10-CM | POA: Insufficient documentation

## 2019-04-10 DIAGNOSIS — E782 Mixed hyperlipidemia: Secondary | ICD-10-CM | POA: Diagnosis present

## 2019-04-10 DIAGNOSIS — L03116 Cellulitis of left lower limb: Secondary | ICD-10-CM | POA: Diagnosis present

## 2019-04-10 DIAGNOSIS — E1142 Type 2 diabetes mellitus with diabetic polyneuropathy: Secondary | ICD-10-CM | POA: Diagnosis not present

## 2019-04-10 DIAGNOSIS — Z950 Presence of cardiac pacemaker: Secondary | ICD-10-CM

## 2019-04-10 DIAGNOSIS — Z79891 Long term (current) use of opiate analgesic: Secondary | ICD-10-CM

## 2019-04-10 DIAGNOSIS — T8131XD Disruption of external operation (surgical) wound, not elsewhere classified, subsequent encounter: Secondary | ICD-10-CM

## 2019-04-10 DIAGNOSIS — E1159 Type 2 diabetes mellitus with other circulatory complications: Secondary | ICD-10-CM

## 2019-04-10 DIAGNOSIS — I5022 Chronic systolic (congestive) heart failure: Secondary | ICD-10-CM | POA: Diagnosis present

## 2019-04-10 DIAGNOSIS — E876 Hypokalemia: Secondary | ICD-10-CM | POA: Diagnosis not present

## 2019-04-10 DIAGNOSIS — Z9682 Presence of neurostimulator: Secondary | ICD-10-CM | POA: Diagnosis not present

## 2019-04-10 DIAGNOSIS — I10 Essential (primary) hypertension: Secondary | ICD-10-CM | POA: Diagnosis not present

## 2019-04-10 DIAGNOSIS — S91002A Unspecified open wound, left ankle, initial encounter: Secondary | ICD-10-CM | POA: Diagnosis not present

## 2019-04-10 DIAGNOSIS — Z1159 Encounter for screening for other viral diseases: Secondary | ICD-10-CM | POA: Diagnosis not present

## 2019-04-10 DIAGNOSIS — Z9102 Food additives allergy status: Secondary | ICD-10-CM

## 2019-04-10 DIAGNOSIS — G4733 Obstructive sleep apnea (adult) (pediatric): Secondary | ICD-10-CM | POA: Diagnosis not present

## 2019-04-10 DIAGNOSIS — T8131XA Disruption of external operation (surgical) wound, not elsewhere classified, initial encounter: Secondary | ICD-10-CM | POA: Diagnosis not present

## 2019-04-10 DIAGNOSIS — Z882 Allergy status to sulfonamides status: Secondary | ICD-10-CM

## 2019-04-10 DIAGNOSIS — Z888 Allergy status to other drugs, medicaments and biological substances status: Secondary | ICD-10-CM

## 2019-04-10 DIAGNOSIS — E11621 Type 2 diabetes mellitus with foot ulcer: Secondary | ICD-10-CM | POA: Diagnosis present

## 2019-04-10 DIAGNOSIS — Z89421 Acquired absence of other right toe(s): Secondary | ICD-10-CM

## 2019-04-10 DIAGNOSIS — Z87891 Personal history of nicotine dependence: Secondary | ICD-10-CM

## 2019-04-10 DIAGNOSIS — T8132XA Disruption of internal operation (surgical) wound, not elsewhere classified, initial encounter: Secondary | ICD-10-CM | POA: Diagnosis present

## 2019-04-10 DIAGNOSIS — Z79899 Other long term (current) drug therapy: Secondary | ICD-10-CM | POA: Diagnosis not present

## 2019-04-10 DIAGNOSIS — Z96662 Presence of left artificial ankle joint: Secondary | ICD-10-CM | POA: Diagnosis not present

## 2019-04-10 DIAGNOSIS — M869 Osteomyelitis, unspecified: Secondary | ICD-10-CM | POA: Diagnosis present

## 2019-04-10 DIAGNOSIS — K219 Gastro-esophageal reflux disease without esophagitis: Secondary | ICD-10-CM | POA: Diagnosis present

## 2019-04-10 DIAGNOSIS — T8459XA Infection and inflammatory reaction due to other internal joint prosthesis, initial encounter: Secondary | ICD-10-CM | POA: Diagnosis not present

## 2019-04-10 DIAGNOSIS — M7989 Other specified soft tissue disorders: Secondary | ICD-10-CM | POA: Diagnosis not present

## 2019-04-10 DIAGNOSIS — Z91048 Other nonmedicinal substance allergy status: Secondary | ICD-10-CM

## 2019-04-10 DIAGNOSIS — Z85828 Personal history of other malignant neoplasm of skin: Secondary | ICD-10-CM

## 2019-04-10 DIAGNOSIS — E66813 Obesity, class 3: Secondary | ICD-10-CM | POA: Diagnosis present

## 2019-04-10 DIAGNOSIS — M25572 Pain in left ankle and joints of left foot: Secondary | ICD-10-CM | POA: Diagnosis not present

## 2019-04-10 DIAGNOSIS — D62 Acute posthemorrhagic anemia: Secondary | ICD-10-CM | POA: Diagnosis not present

## 2019-04-10 DIAGNOSIS — T847XXD Infection and inflammatory reaction due to other internal orthopedic prosthetic devices, implants and grafts, subsequent encounter: Secondary | ICD-10-CM | POA: Diagnosis not present

## 2019-04-10 DIAGNOSIS — Z472 Encounter for removal of internal fixation device: Secondary | ICD-10-CM | POA: Diagnosis not present

## 2019-04-10 DIAGNOSIS — M86172 Other acute osteomyelitis, left ankle and foot: Secondary | ICD-10-CM | POA: Diagnosis not present

## 2019-04-10 DIAGNOSIS — Z89411 Acquired absence of right great toe: Secondary | ICD-10-CM | POA: Diagnosis not present

## 2019-04-10 DIAGNOSIS — I959 Hypotension, unspecified: Secondary | ICD-10-CM | POA: Diagnosis not present

## 2019-04-10 DIAGNOSIS — T8149XA Infection following a procedure, other surgical site, initial encounter: Principal | ICD-10-CM | POA: Diagnosis present

## 2019-04-10 DIAGNOSIS — F4321 Adjustment disorder with depressed mood: Secondary | ICD-10-CM | POA: Diagnosis present

## 2019-04-10 DIAGNOSIS — E1151 Type 2 diabetes mellitus with diabetic peripheral angiopathy without gangrene: Secondary | ICD-10-CM | POA: Diagnosis not present

## 2019-04-10 DIAGNOSIS — Z95828 Presence of other vascular implants and grafts: Secondary | ICD-10-CM | POA: Diagnosis not present

## 2019-04-10 DIAGNOSIS — E1169 Type 2 diabetes mellitus with other specified complication: Secondary | ICD-10-CM

## 2019-04-10 DIAGNOSIS — R58 Hemorrhage, not elsewhere classified: Secondary | ICD-10-CM | POA: Diagnosis not present

## 2019-04-10 DIAGNOSIS — Z87311 Personal history of (healed) other pathological fracture: Secondary | ICD-10-CM | POA: Diagnosis not present

## 2019-04-10 DIAGNOSIS — M86072 Acute hematogenous osteomyelitis, left ankle and foot: Secondary | ICD-10-CM | POA: Diagnosis not present

## 2019-04-10 DIAGNOSIS — E785 Hyperlipidemia, unspecified: Secondary | ICD-10-CM

## 2019-04-10 DIAGNOSIS — B9620 Unspecified Escherichia coli [E. coli] as the cause of diseases classified elsewhere: Secondary | ICD-10-CM | POA: Diagnosis present

## 2019-04-10 DIAGNOSIS — T8130XD Disruption of wound, unspecified, subsequent encounter: Secondary | ICD-10-CM | POA: Diagnosis not present

## 2019-04-10 DIAGNOSIS — Z9689 Presence of other specified functional implants: Secondary | ICD-10-CM | POA: Diagnosis not present

## 2019-04-10 DIAGNOSIS — T8130XA Disruption of wound, unspecified, initial encounter: Secondary | ICD-10-CM | POA: Diagnosis not present

## 2019-04-10 LAB — CBC WITH DIFFERENTIAL/PLATELET
Abs Immature Granulocytes: 0.04 10*3/uL (ref 0.00–0.07)
Basophils Absolute: 0.1 10*3/uL (ref 0.0–0.1)
Basophils Relative: 2 %
Eosinophils Absolute: 0.2 10*3/uL (ref 0.0–0.5)
Eosinophils Relative: 2 %
HCT: 33.3 % — ABNORMAL LOW (ref 39.0–52.0)
Hemoglobin: 10.6 g/dL — ABNORMAL LOW (ref 13.0–17.0)
Immature Granulocytes: 1 %
Lymphocytes Relative: 28 %
Lymphs Abs: 1.9 10*3/uL (ref 0.7–4.0)
MCH: 24.4 pg — ABNORMAL LOW (ref 26.0–34.0)
MCHC: 31.8 g/dL (ref 30.0–36.0)
MCV: 76.7 fL — ABNORMAL LOW (ref 80.0–100.0)
Monocytes Absolute: 0.5 10*3/uL (ref 0.1–1.0)
Monocytes Relative: 7 %
Neutro Abs: 4.1 10*3/uL (ref 1.7–7.7)
Neutrophils Relative %: 60 %
Platelets: 393 10*3/uL (ref 150–400)
RBC: 4.34 MIL/uL (ref 4.22–5.81)
RDW: 16.7 % — ABNORMAL HIGH (ref 11.5–15.5)
WBC: 6.8 10*3/uL (ref 4.0–10.5)
nRBC: 0 % (ref 0.0–0.2)

## 2019-04-10 LAB — SEDIMENTATION RATE: Sed Rate: 69 mm/hr — ABNORMAL HIGH (ref 0–20)

## 2019-04-10 LAB — COMPREHENSIVE METABOLIC PANEL
ALT: 14 U/L (ref 0–44)
AST: 17 U/L (ref 15–41)
Albumin: 3.4 g/dL — ABNORMAL LOW (ref 3.5–5.0)
Alkaline Phosphatase: 64 U/L (ref 38–126)
Anion gap: 12 (ref 5–15)
BUN: 18 mg/dL (ref 8–23)
CO2: 26 mmol/L (ref 22–32)
Calcium: 9.1 mg/dL (ref 8.9–10.3)
Chloride: 101 mmol/L (ref 98–111)
Creatinine, Ser: 0.87 mg/dL (ref 0.61–1.24)
GFR calc Af Amer: >60 mL/min (ref >60)
GFR calc non Af Amer: >60 mL/min (ref >60)
Glucose, Bld: 177 mg/dL — ABNORMAL HIGH (ref 70–99)
Potassium: 3.9 mmol/L (ref 3.5–5.1)
Sodium: 139 mmol/L (ref 135–145)
Total Bilirubin: 0.5 mg/dL (ref 0.3–1.2)
Total Protein: 7.6 g/dL (ref 6.5–8.1)

## 2019-04-10 LAB — TYPE AND SCREEN
ABO/RH(D): O POS
Antibody Screen: NEGATIVE

## 2019-04-10 LAB — HIV ANTIBODY (ROUTINE TESTING W REFLEX): HIV Screen 4th Generation wRfx: NONREACTIVE

## 2019-04-10 LAB — PROTIME-INR
INR: 1.1 (ref 0.8–1.2)
Prothrombin Time: 13.6 seconds (ref 11.4–15.2)

## 2019-04-10 LAB — APTT: aPTT: 35 seconds (ref 24–36)

## 2019-04-10 MED ORDER — SODIUM CHLORIDE 0.9 % IV SOLN
INTRAVENOUS | Status: DC
  Administered 2019-04-10 – 2019-04-12 (×6): via INTRAVENOUS

## 2019-04-10 MED ORDER — FLEET ENEMA 7-19 GM/118ML RE ENEM: 1.0000 | ENEMA | Freq: Once | RECTAL | Status: DC | PRN

## 2019-04-10 MED ORDER — SODIUM CHLORIDE 0.9 % IV SOLN
500.0000 mg | Freq: Once | INTRAVENOUS | Status: AC
  Administered 2019-04-10: 500 mg via INTRAVENOUS
  Filled 2019-04-10: qty 500

## 2019-04-10 MED ORDER — VANCOMYCIN HCL IN DEXTROSE 1-5 GM/200ML-% IV SOLN
1000.0000 mg | Freq: Once | INTRAVENOUS | Status: AC
  Administered 2019-04-10: 12:00:00 1000 mg via INTRAVENOUS
  Filled 2019-04-10: qty 200

## 2019-04-10 MED ORDER — VANCOMYCIN HCL IN DEXTROSE 1-5 GM/200ML-% IV SOLN
1000.0000 mg | Freq: Once | INTRAVENOUS | Status: AC
  Administered 2019-04-10: 20:00:00 1000 mg via INTRAVENOUS
  Filled 2019-04-10: qty 200

## 2019-04-10 MED ORDER — SENNA 8.6 MG PO TABS
1.0000 | ORAL_TABLET | Freq: Two times a day (BID) | ORAL | Status: DC
  Administered 2019-04-10 – 2019-04-15 (×9): 8.6 mg via ORAL
  Filled 2019-04-10 (×12): qty 1

## 2019-04-10 MED ORDER — VANCOMYCIN HCL IN DEXTROSE 1-5 GM/200ML-% IV SOLN: 1000.0000 mg | Freq: Two times a day (BID) | INTRAVENOUS | Status: DC

## 2019-04-10 MED ORDER — POLYETHYLENE GLYCOL 3350 17 G PO PACK
17.0000 g | PACK | Freq: Every day | ORAL | Status: DC | PRN
  Filled 2019-04-10: qty 1

## 2019-04-10 MED ORDER — ENOXAPARIN SODIUM 40 MG/0.4ML ~~LOC~~ SOLN
40.0000 mg | Freq: Two times a day (BID) | SUBCUTANEOUS | Status: DC
  Administered 2019-04-10 – 2019-04-11 (×3): 40 mg via SUBCUTANEOUS
  Filled 2019-04-10 (×4): qty 0.4

## 2019-04-10 MED ORDER — MORPHINE SULFATE (PF) 2 MG/ML IV SOLN
2.0000 mg | INTRAVENOUS | Status: DC | PRN
  Administered 2019-04-10 – 2019-04-12 (×7): 2 mg via INTRAVENOUS
  Filled 2019-04-10 (×7): qty 1

## 2019-04-10 MED ORDER — VANCOMYCIN HCL 1.5 G IV SOLR
1500.0000 mg | Freq: Once | INTRAVENOUS | Status: DC
  Filled 2019-04-10: qty 1500

## 2019-04-10 MED ORDER — OXYCODONE HCL 5 MG PO TABS
5.0000 mg | ORAL_TABLET | ORAL | Status: DC | PRN
  Administered 2019-04-10 – 2019-04-20 (×13): 10 mg via ORAL
  Administered 2019-04-22: 5 mg via ORAL
  Filled 2019-04-10 (×3): qty 2
  Filled 2019-04-10: qty 1
  Filled 2019-04-10 (×7): qty 2
  Filled 2019-04-10: qty 1
  Filled 2019-04-10 (×3): qty 2
  Filled 2019-04-10: qty 1

## 2019-04-10 NOTE — H&P (Signed)
PREOPERATIVE H&P  Chief Complaint: Left ankle wound dehiscence  HPI: Victor Wise is a 68 y.o. male who presents for admission for a left ankle wound dehiscence which began 7 weeks postop when the patient was converted from a cast to a walking boot.  Patient is at a skilled nursing facility and was referred back to the office for increased ankle pain.  Initially the patient was set up for grafting procedure with plastic surgery, but the wound has worsened and plastic surgery has recommended the orthopedic hardware in the lateral ankle be removed, the wound debrided and a VAC dressing applied.  Patient is being admitted for IV antibiotics for early cellulitis around the incision and will have the above-noted surgical procedure within 36 hours.  Patient denies fever chills or other systemic signs of infection.  Past Medical History:  Diagnosis Date  . Allergy   . Basal cell carcinoma    forehead  . BPH (benign prostatic hyperplasia)   . CHF (congestive heart failure) ([place])   . Chronic back pain   . Coronary artery disease   . Depression   . Diabetes ([place])   . GERD (gastroesophageal reflux disease)   . Heart attack ([place])    2018  . Hypertension   . Morbid obesity ([place])   . Obstructive sleep apnea   . Osteomyelitis of foot ([place])   . Status post insertion of spinal cord stimulator   . Stroke [facility])    last one 2-3 years ago  . UTI (lower urinary tract infection)    Past Surgical History:  Procedure Laterality Date  . AMPUTATION TOE Left 04/19/2018   Procedure: AMPUTATION TOE-LEFT GREAT TOE;  Surgeon: Sharlotte Alamo, DPM;  Location: [facility];  Service: Podiatry;  Laterality: Left;  . CARDIAC CATHETERIZATION N/A 12/19/2014   Procedure: Coronary Stent Intervention;  Surgeon: Charolette Forward, MD;  Location: [place] [facility];  Service: Cardiovascular;  Laterality: N/A;  . CARDIAC CATHETERIZATION Left 12/19/2014   Procedure: Left Heart Cath and Coronary Angiography;  Surgeon: Dionisio Avan, MD;   Location: [place] [facility];  Service: Cardiovascular;  Laterality: Left;  . IRRIGATION AND DEBRIDEMENT FOOT Left 03/02/2018   Procedure: IRRIGATION AND DEBRIDEMENT FOOT;  Surgeon: Samara Deist, DPM;  Location: [facility];  Service: Podiatry;  Laterality: Left;  . IRRIGATION AND DEBRIDEMENT FOOT Left 03/08/2018   Procedure: IRRIGATION AND DEBRIDEMENT FOOT AND BONE;  Surgeon: Sharlotte Alamo, DPM;  Location: [facility];  Service: Podiatry;  Laterality: Left;  . IRRIGATION AND DEBRIDEMENT FOOT Left 04/19/2018   Procedure: IRRIGATION AND DEBRIDEMENT FOOT;  Surgeon: Sharlotte Alamo, DPM;  Location: [facility];  Service: Podiatry;  Laterality: Left;  . LOWER EXTREMITY ANGIOGRAPHY Left 03/05/2018   Procedure: Lower Extremity Angiography;  Surgeon: Algernon Huxley, MD;  Location: [place] [facility];  Service: Cardiovascular;  Laterality: Left;  . LOWER EXTREMITY ANGIOGRAPHY Left 03/08/2018   Procedure: LOWER EXTREMITY ANGIOGRAPHY;  Surgeon: Algernon Huxley, MD;  Location: [place] [facility];  Service: Cardiovascular;  Laterality: Left;  . ORIF ANKLE FRACTURE Left 01/31/2019   Procedure: open reduction internal fixation left bimalleolar ankle fracture;  Surgeon: Thornton Park, MD;  Location: [facility];  Service: Orthopedics;  Laterality: Left;  . PACEMAKER INSERTION Left 11/02/2015   Procedure: INSERTION PACEMAKER;  Surgeon: Isaias Cowman, MD;  Location: [facility];  Service: Cardiovascular;  Laterality: Left;  . Pain Stimulator    . Right toe amputation     Social History   Socioeconomic History  .  Marital status: Divorced    Spouse name: Not on file  . Number of children: Not on file  . Years of education: Not on file  . Highest education level: Not on file  Occupational History  . Not on file  Social Needs  . Financial resource strain: Not on file  . Food insecurity    Worry: Not on file    Inability: Not on file  . Transportation needs    Medical: Not on file    Non-medical: Not on file   Tobacco Use  . Smoking status: Former Smoker    Types: Cigarettes  . Smokeless tobacco: Never Used  . Tobacco comment: quit 45 years  Substance and Sexual Activity  . Alcohol use: Not Currently    Alcohol/week: 0.0 standard drinks    Comment: have not had alcohol in 31yrs  . Drug use: No  . Sexual activity: Not on file  Lifestyle  . Physical activity    Days per week: Not on file    Minutes per session: Not on file  . Stress: Not on file  Relationships  . Social Herbalist on phone: Not on file    Gets together: Not on file    Attends religious service: Not on file    Active member of club or organization: Not on file    Attends meetings of clubs or organizations: Not on file    Relationship status: Not on file  Other Topics Concern  . Not on file  Social History Narrative  . Not on file   Family History  Problem Relation Age of Onset  . Breast cancer Mother   . Cancer Mother   . Hypertension Mother   . Lung cancer Father   . Hypertension Father   . Heart disease Father   . Cancer Father   . Kidney disease Sister   . Prostate cancer Neg Hx    Allergies  Allergen Reactions  . Amoxicillin Other (See Comments)    Has never taken amoxicillin -ENT told him not to take (? Had skin test) Has patient had a PCN reaction causing immediate rash, facial/tongue/throat swelling, SOB or lightheadedness with hypotension: No Has patient had a PCN reaction causing severe rash involving mucus membranes or skin necrosis: No Has patient had a PCN reaction that required hospitalization No Has patient had a PCN reaction occurring within the last 10 years: No If above answers are "NO", then may proceed w/ Cephalo  . Other Anaphylaxis, Itching and Other (See Comments)    Pt states that he is allergic to Endopa.  Reaction:  Anaphylaxis  Pt states that he is allergic to Metabisulfites Reaction:  Itching   . Penicillins Other (See Comments)    Arm turned "blue" at injection site  (no treatment needed) Has patient had a PCN reaction causing immediate rash, facial/tongue/throat swelling, SOB or lightheadedness with hypotension: No Has patient had a PCN reaction causing severe rash involving mucus membranes or skin necrosis: No Has patient had a PCN reaction that required hospitalization No Has patient had a PCN reaction occurring within the last 10 years: No If all of the above answers are "NO", then may proceed with Cephalosporin use.  [name] Hydralazine Other (See Comments)    Reaction:  Cramping of extremities Pt reports tolerating low dose 12.5mg  but no higher.  . Cephalexin Itching  . Clindamycin Itching  . Crestor [Rosuvastatin Calcium] Other (See Comments)    Reaction:  Pt is unable to  move arms/legs   . Metoprolol Nausea And Vomiting  . Red Dye Itching  . Statins Other (See Comments)    Reaction:  Pt is unable to move arms/legs  . Sulfa Antibiotics Itching    Other reaction(s): Unknown  . Yellow Dyes (Non-Tartrazine) Itching  . Aspirin Itching and Other (See Comments)    Pt states that he is able to use in lower doses.    . Tape Rash    Please use "paper" tape only.    Prior to Admission medications   Medication Sig Start Date End Date Taking? Authorizing Provider  acetaminophen (TYLENOL) 650 MG CR tablet Take 650 mg by mouth 2 (two) times daily.   Yes [provider]  Amino Acids-Protein Hydrolys (FEEDING SUPPLEMENT, PRO-STAT SUGAR FREE 64,) LIQD Take 30 mLs by mouth daily.   Yes [provider]  amLODipine (NORVASC) 10 MG tablet TAKE 1 TABLET BY MOUTH ONCE DAILY Patient taking differently: Take 10 mg by mouth daily.  12/03/18  Yes Kendell Bane, NP  aspirin EC 81 MG tablet Take 81 mg by mouth daily.   Yes [provider]  benazepril (LOTENSIN) 20 MG tablet TAKE 1 TABLET BY MOUTH TWICE DAILY Patient taking differently: Take 20 mg by mouth 2 (two) times daily.  02/27/19  Yes Scarboro, Audie Clear, NP  clopidogrel (PLAVIX) 75 MG  tablet Take 1 tablet (75 mg total) by mouth daily. 06/28/18  Yes Boscia, Greer Ee, NP  DULoxetine (CYMBALTA) 30 MG capsule Take 30 mg by mouth every evening.   Yes [provider]  DULoxetine (CYMBALTA) 60 MG capsule Take 60 mg by mouth daily.   Yes [provider]  Exenatide ER (BYDUREON) 2 MG PEN Inject 2 mg into the skin once a week. Patient taking differently: Inject 2 mg into the skin every Sunday.  11/26/18  Yes Scarboro, Audie Clear, NP  ezetimibe (ZETIA) 10 MG tablet Take 10 mg by mouth daily.  10/29/18  Yes [provider]  fenofibrate 54 MG tablet TAKE 1 TABLET BY MOUTH A DAY AT SUPPERTIME Patient taking differently: Take 54 mg by mouth daily at 6 PM.  12/03/18  Yes Scarboro, Audie Clear, NP  furosemide (LASIX) 20 MG tablet Take 20 mg by mouth daily. 10/01/18  Yes [provider]  hydrochlorothiazide (HYDRODIURIL) 12.5 MG tablet Take 1 tablet (12.5 mg total) by mouth daily. 01/31/19  Yes Boscia, Heather E, NP  insulin NPH Human (NOVOLIN N) 100 UNIT/ML injection Inject 45 Units into the skin 2 (two) times daily.   Yes [provider]  insulin regular (NOVOLIN R) 100 units/mL injection Patient to use Novolin R Cameron TiD prior to meals per sliding scale instructions. Max daily dose is 45 units per day. Patient taking differently: Inject 1-9 Units into the skin 3 (three) times daily. If bs is 120-150= 1 unit, 151-200=2 units, 201-250= 3 units, 251-300 = 5 units, 301-350= 7 units, 351-400= 9 units, >400 call md 06/22/18  Yes Scarboro, Audie Clear, NP  lactulose (CHRONULAC) 10 GM/15ML solution Take 20 g by mouth See admin instructions. Take 20 g in the evening, may take an additional 20 g up to 2 times daily as needed for constipation   Yes [provider]  lidocaine (LIDODERM) 5 % Place 1 patch onto the skin daily as needed (pain). Remove & Discard patch within 12 hours or as directed by MD   Yes [provider]  linaclotide (LINZESS) 145 MCG CAPS capsule  Take 290 mcg  by mouth daily before breakfast.   Yes [provider]  Multiple Vitamin (MULTIVITAMIN WITH MINERALS) TABS tablet Take 1 tablet by mouth daily.   Yes [provider]  NARCAN 4 MG/0.1ML LIQD Place 4 mg into the nose as needed (for opioid overdose).    Yes [provider]  omega-3 acid ethyl esters (LOVAZA) 1 g capsule Take 1 capsule (1 g total) by mouth 2 (two) times daily. 01/24/19  Yes Scarboro, Audie Clear, NP  oxcarbazepine (TRILEPTAL) 600 MG tablet Take 600 mg by mouth 2 (two) times daily.   Yes [provider]  oxyCODONE (OXYCONTIN) 20 mg 12 hr tablet Take 1 tablet (20 mg total) by mouth every 12 (twelve) hours. 02/07/19  Yes Thornton Park, MD  pantoprazole (PROTONIX) 40 MG tablet TAKE 1 TABLET BY MOUTH ONCE DAILY FOR REFLUX Patient taking differently: Take 40 mg by mouth daily.  02/01/19  Yes Scarboro, Audie Clear, NP  polyethylene glycol (MIRALAX / GLYCOLAX) packet Take 17 g by mouth 2 (two) times daily.    Yes [provider]  pregabalin (LYRICA) 200 MG capsule Take 400 mg by mouth 2 (two) times daily.   Yes [provider]  tamsulosin (FLOMAX) 0.4 MG CAPS capsule TAKE 1 CAPSULE BY MOUTH ONCE DAILY AFTERSUPPER Patient taking differently: Take 0.4 mg by mouth daily at 6 PM.  02/01/19  Yes Scarboro, Audie Clear, NP  vitamin C (ASCORBIC ACID) 250 MG tablet Take 500 mg by mouth 2 (two) times daily.   Yes [provider]     Positive ROS: All other systems have been reviewed and were otherwise negative with the exception of those mentioned in the HPI and as above.  Physical Exam: General: Alert, no acute distress Cardiovascular: Regular rate and rhythm, no murmurs rubs or gallops.  No pedal edema Respiratory: Clear to auscultation bilaterally, no wheezes rales or rhonchi. No cyanosis, no use of accessory musculature GI: No organomegaly, abdomen is soft and non-tender nondistended with positive bowel sounds. Skin: Skin intact, no  lesions within the operative field. Neurologic: Sensation intact distally Psychiatric: Patient is competent for consent with normal mood and affect Lymphatic: No cervical lymphadenopathy  MUSCULOSKELETAL: Left ankle: Patient has an open lateral incision with exposed hardware over the lateral malleolus.  There is no purulent drainage seen.  Patient has erythema around the incision without fluctuance.  Patient's foot appears perfused.  He has a previous amputation to his left and right great toes.  Patient has intact sensation to light touch in the left foot.  His foot and leg compartments are soft and compressible.    Assessment: Left lateral ankle wound dehiscence  Plan: Plan for  Admission to orthopedics for IV antibiotics. Patient was seen and cleared for surgery by the hospitalist service. Patient will be on Lovenox for DVT prophylaxis until surgery. Surgery for surgical debridement, hardware removal and VAC dressing placement is scheduled for Friday morning.    Thornton Park, MD   04/10/2019 2:25 PM

## 2019-04-10 NOTE — Consult Note (Signed)
Medical consultation   Victor Wise [id number] DOB: 1951/05/03 DOA: 04/10/2019  PCP: Lavera Guise, MD Consultation requested by the ER provider  Chief Complaint: " Screw sticking out"  HPI: Victor Wise is a 68 y.o. male with medical history significant of CAD, DM 2, GERD, HTN, OSA, CVA, BPH, systolic CHF EF [id number], PVD, morbid obesity with BMI greater than 40 presented to the hospital with screw sticking out from his left ankle ulcer.  Patient initially presented in September with displaced bimalleolar left lower extremity closed fracture underwent ORIF of the left ankle on 01/31/2019.  Earlier in the month had lateral wound dehiscence without obvious infection, he was empirically started on doxycycline and sent to plastic surgery.  Seen by plastic surgery who recommended debridement of the left leg with ACell and VAC placement. [facility] for evaluation of exposed screw with a lot of pain and deep ulcer on the lateral side of the left leg.  Seen and examined at bedside, left lower extremity dressing noted, open ulcer with visible hardware.  Review of Systems: As per HPI otherwise 10 point review of systems negative.  Review of Systems Otherwise negative except as per HPI, including: General: Denies fever, chills, night sweats or unintended weight loss. Resp: Denies cough, wheezing, shortness of breath. Cardiac: Denies chest pain, palpitations, orthopnea, paroxysmal nocturnal dyspnea. GI: Denies abdominal pain, nausea, vomiting, diarrhea or constipation GU: Denies dysuria, frequency, hesitancy or incontinence MS: Denies muscle aches, joint pain or swelling Neuro: Denies headache, neurologic deficits (focal weakness, numbness, tingling), abnormal gait Psych: Denies anxiety, depression, SI/HI/AVH Skin: Denies new rashes or lesions ID: Denies sick contacts, exotic exposures, travel  Past Medical History:  Diagnosis Date  . Allergy   . Basal cell carcinoma     forehead  . BPH (benign prostatic hyperplasia)   . CHF (congestive heart failure) ([place])   . Chronic back pain   . Coronary artery disease   . Depression   . Diabetes ([place])   . GERD (gastroesophageal reflux disease)   . Heart attack ([place])    2018  . Hypertension   . Morbid obesity ([place])   . Obstructive sleep apnea   . Osteomyelitis of foot ([place])   . Status post insertion of spinal cord stimulator   . Stroke [facility])    last one 2-3 years ago  . UTI (lower urinary tract infection)     Past Surgical History:  Procedure Laterality Date  . AMPUTATION TOE Left 04/19/2018   Procedure: AMPUTATION TOE-LEFT GREAT TOE;  Surgeon: Sharlotte Alamo, DPM;  Location: [facility] ORS;  Service: Podiatry;  Laterality: Left;  . CARDIAC CATHETERIZATION N/A 12/19/2014   Procedure: Coronary Stent Intervention;  Surgeon: Charolette Forward, MD;  Location: [place] [facility];  Service: Cardiovascular;  Laterality: N/A;  . CARDIAC CATHETERIZATION Left 12/19/2014   Procedure: Left Heart Cath and Coronary Angiography;  Surgeon: Dionisio Birl, MD;  Location: [place] [facility];  Service: Cardiovascular;  Laterality: Left;  . IRRIGATION AND DEBRIDEMENT FOOT Left 03/02/2018   Procedure: IRRIGATION AND DEBRIDEMENT FOOT;  Surgeon: Samara Deist, DPM;  Location: [facility] ORS;  Service: Podiatry;  Laterality: Left;  . IRRIGATION AND DEBRIDEMENT FOOT Left 03/08/2018   Procedure: IRRIGATION AND DEBRIDEMENT FOOT AND BONE;  Surgeon: Sharlotte Alamo, DPM;  Location: [facility] OR[facility];  Service: Podiatry;  Laterality: Left;  . IRRIGATION AND DEBRIDEMENT FOOT Left 04/19/2018   Procedure: IRRIGATION AND DEBRIDEMENT FOOT;  Surgeon: Sharlotte Alamo, DPM;  Location: [facility] OR[facility];  Service: Podiatry;  Laterality: Left;  . LOWER EXTREMITY ANGIOGRAPHY Left 03/05/2018   Procedure: Lower Extremity Angiography;  Surgeon: Algernon Huxley, MD;  Location: [place] [facility];  Service: Cardiovascular;  Laterality: Left;  . LOWER EXTREMITY ANGIOGRAPHY Left 03/08/2018    Procedure: LOWER EXTREMITY ANGIOGRAPHY;  Surgeon: Algernon Huxley, MD;  Location: [place] [facility];  Service: Cardiovascular;  Laterality: Left;  . ORIF ANKLE FRACTURE Left 01/31/2019   Procedure: open reduction internal fixation left bimalleolar ankle fracture;  Surgeon: Thornton Park, MD;  Location: [facility] OR[facility];  Service: Orthopedics;  Laterality: Left;  . PACEMAKER INSERTION Left 11/02/2015   Procedure: INSERTION PACEMAKER;  Surgeon: Isaias Cowman, MD;  Location: [facility] OR[facility];  Service: Cardiovascular;  Laterality: Left;  . Pain Stimulator    . Right toe amputation      SOCIAL HISTORY:  reports that he has quit smoking. His smoking use included cigarettes. He has never used smokeless tobacco. He reports previous alcohol use. He reports that he does not use drugs.  Allergies  Allergen Reactions  . Amoxicillin Other (See Comments)    Has never taken amoxicillin -ENT told him not to take (? Had skin test) Has patient had a PCN reaction causing immediate rash, facial/tongue/throat swelling, SOB or lightheadedness with hypotension: No Has patient had a PCN reaction causing severe rash involving mucus membranes or skin necrosis: No Has patient had a PCN reaction that required hospitalization No Has patient had a PCN reaction occurring within the last 10 years: No If above answers are "NO", then may proceed w/ Cephalo  . Other Anaphylaxis, Itching and Other (See Comments)    Pt states that he is allergic to Endopa.  Reaction:  Anaphylaxis  Pt states that he is allergic to Metabisulfites Reaction:  Itching   . Penicillins Other (See Comments)    Arm turned "blue" at injection site (no treatment needed) Has patient had a PCN reaction causing immediate rash, facial/tongue/throat swelling, SOB or lightheadedness with hypotension: No Has patient had a PCN reaction causing severe rash involving mucus membranes or skin necrosis: No Has patient had a PCN reaction that required hospitalization No  Has patient had a PCN reaction occurring within the last 10 years: No If all of the above answers are "NO", then may proceed with Cephalosporin use.  [name] Hydralazine Other (See Comments)    Reaction:  Cramping of extremities Pt reports tolerating low dose 12.5mg  but no higher.  . Cephalexin Itching  . Clindamycin Itching  . Crestor [Rosuvastatin Calcium] Other (See Comments)    Reaction:  Pt is unable to move arms/legs   . Metoprolol Nausea And Vomiting  . Red Dye Itching  . Statins Other (See Comments)    Reaction:  Pt is unable to move arms/legs  . Sulfa Antibiotics Itching    Other reaction(s): Unknown  . Yellow Dyes (Non-Tartrazine) Itching  . Aspirin Itching and Other (See Comments)    Pt states that he is able to use in lower doses.    . Tape Rash    Please use "paper" tape only.     FAMILY HISTORY: Family History  Problem Relation Age of Onset  . Breast cancer Mother   . Cancer Mother   . Hypertension Mother   . Lung cancer Father   . Hypertension Father   . Heart disease Father   . Cancer Father   . Kidney disease Sister   . Prostate cancer Neg Hx      Prior to Admission  medications   Medication Sig Start Date End Date Taking? Authorizing Provider  acetaminophen (TYLENOL) 650 MG CR tablet Take 650 mg by mouth 2 (two) times daily.    [provider]  Amino Acids-Protein Hydrolys (FEEDING SUPPLEMENT, PRO-STAT SUGAR FREE 64,) LIQD Take 30 mLs by mouth daily.    [provider]  amLODipine (NORVASC) 10 MG tablet TAKE 1 TABLET BY MOUTH ONCE DAILY Patient taking differently: Take 10 mg by mouth daily.  12/03/18   Kendell Bane, NP  aspirin EC 81 MG tablet Take 81 mg by mouth daily.    [provider]  benazepril (LOTENSIN) 20 MG tablet TAKE 1 TABLET BY MOUTH TWICE DAILY Patient taking differently: Take 20 mg by mouth 2 (two) times daily.  02/27/19   Kendell Bane, NP  bisacodyl (DULCOLAX) 10 MG suppository Place 1 suppository (10 mg  total) rectally daily as needed for moderate constipation. 02/07/19   Thornton Park, MD  cetirizine (ZYRTEC) 10 MG tablet Take 10 mg by mouth daily as needed for allergies.    [provider]  clopidogrel (PLAVIX) 75 MG tablet Take 1 tablet (75 mg total) by mouth daily. 06/28/18   Ronnell Freshwater, NP  DULoxetine (CYMBALTA) 30 MG capsule Take 30 mg by mouth every evening.    [provider]  DULoxetine (CYMBALTA) 60 MG capsule Take 60 mg by mouth daily.    [provider]  Exenatide ER (BYDUREON) 2 MG PEN Inject 2 mg into the skin once a week. Patient taking differently: Inject 2 mg into the skin every Sunday.  11/26/18   Kendell Bane, NP  ezetimibe (ZETIA) 10 MG tablet Take 10 mg by mouth daily.  10/29/18   [provider]  fenofibrate 54 MG tablet TAKE 1 TABLET BY MOUTH A DAY AT SUPPERTIME Patient taking differently: Take 54 mg by mouth daily at 6 PM.  12/03/18   Scarboro, Audie Clear, NP  fluticasone (FLONASE) 50 MCG/ACT nasal spray Place 2 sprays into both nostrils daily as needed for allergies.     [provider]  furosemide (LASIX) 20 MG tablet Take 20 mg by mouth daily. 10/01/18   [provider]  glucose blood (TRUE METRIX BLOOD GLUCOSE TEST) test strip Use as instructed three times a day diag e11.65 08/13/18   Ronnell Freshwater, NP  hydrochlorothiazide (HYDRODIURIL) 12.5 MG tablet Take 1 tablet (12.5 mg total) by mouth daily. 01/31/19   Ronnell Freshwater, NP  insulin NPH Human (NOVOLIN N) 100 UNIT/ML injection Inject 45 Units into the skin 2 (two) times daily.    [provider]  Insulin Pen Needle (B-D UF III MINI PEN NEEDLES) 31G X 5 MM MISC Use as directed with insulin e11.65 12/26/17   Ronnell Freshwater, NP  insulin regular (NOVOLIN R) 100 units/mL injection Patient to use Novolin R [place] TiD prior to meals per sliding scale instructions. Max daily dose is 45 units per day. Patient taking differently: Inject 1-9 Units into the skin  3 (three) times daily. If bs is 120-150= 1 unit, 151-200=2 units, 201-250= 3 units, 251-300 = 5 units, 301-350= 7 units, 351-400= 9 units, >400 call md 06/22/18   Kendell Bane, NP  INSULIN SYRINGE 1CC/29G 29G X 1/2" 1 ML MISC Insulin injections QID, use with lantus and regular insulin. DX E11.65 07/05/18   Kendell Bane, NP  Insulin Syringe-Needle U-100 (BD INSULIN SYRINGE U/F 1/2UNIT) 31G X 5/16" 0.3 ML MISC Indulin injection QID. Use with  lantus and regular insulin Dx. 11.65 01/08/18   Lavera Guise, MD  lactulose [facility]) 10 GM/15ML solution Take 20 g by mouth See admin instructions. Take 20 g in the evening, may take an additional 20 g up to 2 times daily as needed for constipation    [provider]  lidocaine (LIDODERM) 5 % Place 1 patch onto the skin daily as needed (pain). Remove & Discard patch within 12 hours or as directed by MD    [provider]  linaclotide (LINZESS) 145 MCG CAPS capsule Take 290 mcg by mouth daily before breakfast.    [provider]  Multiple Vitamin (MULTIVITAMIN WITH MINERALS) TABS tablet Take 1 tablet by mouth daily.    [provider]  NARCAN 4 MG/0.1ML LIQD Place 4 mg into the nose as needed (for opioid overdose).     [provider]  omega-3 acid ethyl esters (LOVAZA) 1 g capsule Take 1 capsule (1 g total) by mouth 2 (two) times daily. 01/24/19   Kendell Bane, NP  oxcarbazepine (TRILEPTAL) 600 MG tablet Take 600 mg by mouth 2 (two) times daily.    [provider]  oxyCODONE (OXY IR/ROXICODONE) 5 MG immediate release tablet Take 1-2 tablets (5-10 mg total) by mouth every 4 (four) hours as needed for moderate pain (pain score 4-6). 02/07/19   Thornton Park, MD  oxyCODONE (OXYCONTIN) 20 mg 12 hr tablet Take 1 tablet (20 mg total) by mouth every 12 (twelve) hours. 02/07/19   Thornton Park, MD  pantoprazole (PROTONIX) 40 MG tablet TAKE 1 TABLET BY MOUTH ONCE DAILY FOR REFLUX Patient taking differently:  Take 40 mg by mouth daily.  02/01/19   Scarboro, Audie Clear, NP  polyethylene glycol (MIRALAX / Floria Raveling) packet Take 17 g by mouth 2 (two) times daily.     [provider]  tamsulosin (FLOMAX) 0.4 MG CAPS capsule TAKE 1 CAPSULE BY MOUTH ONCE DAILY AFTERSUPPER Patient taking differently: Take 0.4 mg by mouth daily at 6 PM.  02/01/19   Scarboro, Audie Clear, NP  vitamin C (ASCORBIC ACID) 250 MG tablet Take 500 mg by mouth 2 (two) times daily.    [provider]    Physical Exam: Vitals:   04/10/19 0839 04/10/19 0840 04/10/19 1000  BP: (!) 126/59  (!) 143/75  Pulse: 65  66  Resp: 20  17  Temp: 98.4 F (36.9 C)    TempSrc: Oral    SpO2: 99%  99%  Weight:  122.5 kg   Height:  5\' 8"  (1.727 m)       Constitutional: NAD, calm, comfortable, morbid obesity Eyes: PERRL, lids and conjunctivae normal ENMT: Mucous membranes are moist. Posterior pharynx clear of any exudate or lesions.Normal dentition.  Neck: normal, supple, no masses, no thyromegaly Respiratory: clear to auscultation bilaterally, no wheezing, no crackles. Normal respiratory effort. No accessory muscle use.  Cardiovascular: Regular rate and rhythm, no murmurs / rubs / gallops. No extremity edema. 2+ pedal pulses. No carotid bruits.  Abdomen: no tenderness, no masses palpated. No hepatosplenomegaly. Bowel sounds positive.  Musculoskeletal: no clubbing / cyanosis. No joint deformity upper and lower extremities. Good ROM, no contractures. Normal muscle tone.  Skin: Left lower extremity dressing noted, open ulcer of the left malleolus area with visible hardware.  Somewhat foul-smelling Neurologic: CN 2-12 grossly intact. Sensation intact, DTR normal. Strength 4+/5 in all 4.  Psychiatric: Normal judgment and insight. Alert and oriented x 3. Normal mood.     Labs on Admission:  I have personally reviewed following labs and imaging studies  CBC: Recent Labs  Lab 04/10/19 0917  WBC 6.8  NEUTROABS 4.1  HGB 10.6*  HCT  33.3*  MCV 76.7*  PLT [id number]   Basic Metabolic Panel: Recent Labs  Lab 04/10/19 0917  NA 139  K 3.9  CL 101  CO2 26  GLUCOSE 177*  BUN 18  CREATININE 0.87  CALCIUM 9.1   GFR: Estimated Creatinine Clearance: 104.9 mL/min (by C-G formula based on SCr of 0.87 mg/dL). Liver Function Tests: Recent Labs  Lab 04/10/19 0917  AST 17  ALT 14  ALKPHOS 64  BILITOT 0.5  PROT 7.6  ALBUMIN 3.4*   No results for input(s): LIPASE, AMYLASE in the last 168 hours. No results for input(s): AMMONIA in the last 168 hours. Coagulation Profile: No results for input(s): INR, PROTIME in the last 168 hours. Cardiac Enzymes: No results for input(s): CKTOTAL, CKMB, CKMBINDEX, TROPONINI in the last 168 hours. BNP (last 3 results) No results for input(s): PROBNP in the last 8760 hours. HbA1C: No results for input(s): HGBA1C in the last 72 hours. CBG: No results for input(s): GLUCAP in the last 168 hours. Lipid Profile: No results for input(s): CHOL, HDL, LDLCALC, TRIG, CHOLHDL, LDLDIRECT in the last 72 hours. Thyroid Function Tests: No results for input(s): TSH, T4TOTAL, FREET4, T3FREE, THYROIDAB in the last 72 hours. Anemia Panel: No results for input(s): VITAMINB12, FOLATE, FERRITIN, TIBC, IRON, RETICCTPCT in the last 72 hours. Urine analysis:    Component Value Date/Time   COLORURINE YELLOW (A) 10/05/2018 2329   APPEARANCEUR Cloudy (A) 01/28/2019 0959   LABSPEC 1.012 10/05/2018 2329   LABSPEC 1.012 02/06/2014 2120   PHURINE 5.0 10/05/2018 2329   GLUCOSEU Negative 01/28/2019 0959   GLUCOSEU Negative 02/06/2014 2120   HGBUR NEGATIVE 10/05/2018 2329   BILIRUBINUR Negative 01/28/2019 0959   BILIRUBINUR Negative 02/06/2014 2120   KETONESUR NEGATIVE 10/05/2018 2329   PROTEINUR Negative 01/28/2019 0959   PROTEINUR NEGATIVE 10/05/2018 2329   NITRITE Negative 01/28/2019 0959   NITRITE NEGATIVE 10/05/2018 2329   LEUKOCYTESUR 2+ (A) 01/28/2019 0959   LEUKOCYTESUR TRACE (A) 10/05/2018 2329    LEUKOCYTESUR Negative 02/06/2014 2120   Sepsis Labs: !!!!!!!!!!!!!!!!!!!!!!!!!!!!!!!!!!!!!!!!!!!! @LABRCNTIP (procalcitonin:4,lacticidven:4) ) Recent Results (from the past 240 hour(s))  SARS CORONAVIRUS 2 (TAT 6-24 HRS) Nasopharyngeal Nasopharyngeal Swab     Status: None   Collection Time: 04/08/19  1:20 PM   Specimen: Nasopharyngeal Swab  Result Value Ref Range Status   SARS Coronavirus 2 NEGATIVE NEGATIVE Final    Comment: (NOTE) SARS-CoV-2 target nucleic acids are NOT DETECTED. The SARS-CoV-2 RNA is generally detectable in upper and lower respiratory specimens during the acute phase of infection. Negative results do not preclude SARS-CoV-2 infection, do not rule out co-infections with other pathogens, and should not be used as the sole basis for treatment or other patient management decisions. Negative results must be combined with clinical observations, patient history, and epidemiological information. The expected result is Negative. Fact Sheet for Patients: SugarRoll.be Fact Sheet for Healthcare Providers: https://www.woods-mathews.com/ This test is not yet approved or cleared by the [place] FDA and  has been authorized for detection and/or diagnosis of SARS-CoV-2 by FDA under an Emergency Use Authorization (EUA). This EUA will remain  in effect (meaning this test can be used) for the duration of the COVID-19 declaration under Section 56 4(b)(1) of the Act, 21 U.S.C. section 360bbb-3(b)(1), unless the authorization is terminated or revoked sooner. Performed at [facility], [place]  [address], [place], [place] [address]      Radiological Exams on Admission: Dg Ankle Complete Left  Result Date: 04/10/2019 CLINICAL DATA:  Left ankle pain and swelling. Skin ulceration on the lateral aspect of the ankle. History of prior fracture fixation. EXAM: LEFT ANKLE COMPLETE - 3+ VIEW COMPARISON:  Plain films left ankle 03/23/2019.  FINDINGS: Plate and screw fixation of a distal fibular fracture is identified. There is a large skin ulceration over the distal aspect of the plate. Lucency in bone at the level of and interfragmentary screw and distal aspect of the plate is worrisome for osteomyelitis. The patient also has 2 screws in the medial malleolus for fixation of a fracture. No bridging bone is identified and there is lucency about the screws. Cortical destruction is seen in the anterior aspect of the distal tibia. There is an ankle joint effusion. Soft tissues about the ankle are swollen. IMPRESSION: Findings highly suspicious for septic ankle joint with osteomyelitis in both the distal tibia and lateral malleolus as described above. Electronically Signed   By: Inge Rise M.D.   On: 04/10/2019 09:51     All images have been reviewed by me personally.    Assessment/Plan Principal Problem:   Infected hardware in left lower extremity (HCC) Active Problems:   Mixed hyperlipidemia   Essential hypertension   OSA on CPAP   Benign essential hypertension   Coronary artery disease due to lipid rich plaque   Diabetes (HCC)   Obesity, Class III, BMI 40-49.9 (morbid obesity) ([place])   Bimalleolar fracture of left ankle    Left ankle hardware malfunction/exposed Concerns for left ankle osteomyelitis -Management per orthopedic-will require surgical intervention- likely hardware removal with I&D/ possible vac placement.  -Given allergy list-for now consider using vancomycin and Levaquin. May need to add rifampin if Hardware remains in.  Will be Beneficial to obtain infectious disease input for long-term antibiotic use.  Supportive care.  Pain control.  Essential hypertension -Continue Norvasc 10 mg daily, Benzapril 20 mg twice daily  Coronary artery disease -Currently chest pain-free.  Continue aspirin and Plavix once cleared by surgery. -EKG-  Chronic systolic congestive heart failure, EF 45%, class I -Appears to be  euvolemic.  Resume home Lasix -Echo 08/2018-ejection fraction 45-50 percent.  Diabetes mellitus type 2, poorly controlled -Insulin sliding scale and Accu-Chek.  01/2019-hemoglobin A1c 8.9  HLD -Zetia  GERD -PPI  BPH -Flomax  Currently awaiting on pharmacy to complete the med rec.  Will complete it once performed.   DVT prophylaxis: Per orthopedic Code Status: Full Family Communication: None    Time Spent: 65 minutes.  >50% of the time was devoted to discussing the patients care, assessment, plan and disposition with other care givers along with counseling the patient about the risks and benefits of treatment.    Ankit Arsenio Loader MD [facility]  If 7PM-7AM, please contact night-coverage   04/10/2019, 10:28 AM

## 2019-04-10 NOTE — Consult Note (Addendum)
Pharmacy Antibiotic Note  Victor Wise is a 68 y.o. male admitted on 04/10/2019 with wound infection.  Pharmacy has been consulted for Vancomycin dosing.  Patient received Vancomycin 1g x1 today at around noon. However, per nurse, patient experience red splotchy areas to right arm after Vancomycin was started and the medication was stopped. Nurse notified pharmacy and attending of reaction.   Notably, patient has been on Vancomycin with no prior issues related to today's event.  Plan: 1. Pending approval by attending: Will order Vancomycin 1g (for a total loading dose of 2g) followed by 1000 mg Q12H- first dose due at 0300 tomorrow. When ordering Vancomycin will slow down the infusion and patient will need antihistamines. AUC goal: 400-550 Expected AUC: 462.8 Cssmin: 13.2  Height: 5\' 8"  (172.7 cm) Weight: 270 lb (122.5 kg) IBW/kg (Calculated) : 68.4  Temp (24hrs), Avg:98.4 F (36.9 C), Min:98.4 F (36.9 C), Max:98.4 F (36.9 C)  Recent Labs  Lab 04/10/19 0917  WBC 6.8  CREATININE 0.87    Estimated Creatinine Clearance: 104.9 mL/min (by C-G formula based on SCr of 0.87 mg/dL).    Allergies  Allergen Reactions  . Amoxicillin Other (See Comments)    Has never taken amoxicillin -ENT told him not to take (? Had skin test) Has patient had a PCN reaction causing immediate rash, facial/tongue/throat swelling, SOB or lightheadedness with hypotension: No Has patient had a PCN reaction causing severe rash involving mucus membranes or skin necrosis: No Has patient had a PCN reaction that required hospitalization No Has patient had a PCN reaction occurring within the last 10 years: No If above answers are "NO", then may proceed w/ Cephalo  . Other Anaphylaxis, Itching and Other (See Comments)    Pt states that he is allergic to Endopa.  Reaction:  Anaphylaxis  Pt states that he is allergic to Metabisulfites Reaction:  Itching   . Penicillins Other (See Comments)    Arm turned "blue" at  injection site (no treatment needed) Has patient had a PCN reaction causing immediate rash, facial/tongue/throat swelling, SOB or lightheadedness with hypotension: No Has patient had a PCN reaction causing severe rash involving mucus membranes or skin necrosis: No Has patient had a PCN reaction that required hospitalization No Has patient had a PCN reaction occurring within the last 10 years: No If all of the above answers are "NO", then may proceed with Cephalosporin use.  [name] Hydralazine Other (See Comments)    Reaction:  Cramping of extremities Pt reports tolerating low dose 12.5mg  but no higher.  . Cephalexin Itching  . Clindamycin Itching  . Crestor [Rosuvastatin Calcium] Other (See Comments)    Reaction:  Pt is unable to move arms/legs   . Metoprolol Nausea And Vomiting  . Red Dye Itching  . Statins Other (See Comments)    Reaction:  Pt is unable to move arms/legs  . Sulfa Antibiotics Itching    Other reaction(s): Unknown  . Yellow Dyes (Non-Tartrazine) Itching  . Aspirin Itching and Other (See Comments)    Pt states that he is able to use in lower doses.    . Tape Rash    Please use "paper" tape only.    Thank you for allowing pharmacy to be a part of this patient's care.  [name] 04/10/2019 1:54 PM

## 2019-04-10 NOTE — ED Triage Notes (Signed)
Pt here with c/o left foot pain for 2 weeks now, was sent here by Dr for surgery to have screw removed that was put in on 03/24/19. NAD.

## 2019-04-10 NOTE — Progress Notes (Signed)
Anticoagulation monitoring(Lovenox):  68yo  male ordered Lovenox 40 mg Q24h  Filed Weights   04/10/19 0840  Weight: 270 lb (122.5 kg)   BMI 41.05   Lab Results  Component Value Date   CREATININE 0.87 04/10/2019   CREATININE 1.13 03/24/2019   CREATININE 1.07 03/23/2019   Estimated Creatinine Clearance: 104.9 mL/min (by C-G formula based on SCr of 0.87 mg/dL). Hemoglobin & Hematocrit     Component Value Date/Time   HGB 10.6 (L) 04/10/2019 0917   HGB 12.3 (L) 01/28/2019 1000   HCT 33.3 (L) 04/10/2019 0917   HCT 35.9 (L) 01/28/2019 1000     Per Protocol for Patient with estCrcl > 30 ml/min and BMI > 40, will transition to Lovenox 40 mg Q12h.

## 2019-04-10 NOTE — ED Notes (Signed)
Pt has red splotchy areas to right arm after vanc.  Vancomycin stopped, almost complete.  Pt itching to this area.  Dr Mack Guise sent message.  Also informed pharmacy

## 2019-04-10 NOTE — ED Provider Notes (Addendum)
[facility] Emergency Department Provider Note   ____________________________________________   First MD Initiated Contact with Patient 04/10/19 [phone]     (approximate)  I have reviewed the triage vital signs and the nursing notes.   HISTORY  Chief Complaint Sent by Dr-wound care   HPI Victor Wise is a 68 y.o. male with a history of CHF and diabetes and coronary artery disease as well as hypertension morbid obesity.  He is at [facility] for rehab.  He was sent here today by Dr. Providence Crosby because a screw in his ankle is exposed now.  He is having a lot of pain there as well.  Patient does have a deep ulcer on the lateral side of his leg and you can see metal sticking out.         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[place])    Status post insertion of spinal cord stimulator    Stroke ([place])    last one 2-3 years ago   UTI (lower urinary tract infection)     Patient Active Problem List   Diagnosis Date Noted   Leg wound, left, initial encounter 03/26/2019   Bimalleolar fracture of left ankle 01/31/2019   Bilateral leg edema 08/20/2018   Diabetic ulcer of left foot ([place]) 04/18/2018   Swelling of limb 04/06/2018   Atherosclerosis of native arteries of the extremities with ulceration ([place]) 04/06/2018   Edema of lower extremity 04/04/2018   Diabetic foot infection ([place]) 03/01/2018   Spinal cord stimulator status 01/12/2018   Essential hypertension 09/05/2017   OSA on CPAP 09/05/2017   Uncontrolled type 2 diabetes mellitus with hyperglycemia ([place])  08/02/2017   Peripheral vascular disease ([place]) 08/02/2017   Mixed hyperlipidemia 08/02/2017   Dysuria 08/02/2017   Obesity, Class III, BMI 40-49.9 (morbid obesity) ([place]) 08/10/2016   Hemiparesis affecting left side as late effect of stroke ([place]) 05/09/2016   Symptomatic bradycardia 03/21/2016   Falls frequently 02/26/2016   Acute renal failure (ARF) ([place]) 02/18/2016   Hypotension 02/18/2016   Sick sinus syndrome ([place]) 10/28/2015   Syncopal episodes 10/26/2015   Syncope 10/26/2015   Adjustment disorder with mixed anxiety and depressed mood 10/20/2015   Dysthymia 10/20/2015   CVA (cerebral infarction) 10/19/2015   Urinary retention 03/25/2015   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   Coronary artery disease due to lipid rich plaque 12/29/2014   Acute MI, anterolateral wall, subsequent episode of care ([place]) 12/19/2014   SIRS (systemic inflammatory response syndrome) ([place]) 12/18/2014   Systemic inflammatory response syndrome (sirs) of non-infectious origin without acute organ dysfunction ([place]) 12/18/2014   Encounter for other preprocedural examination 11/10/2014   LVH (left ventricular hypertrophy) due to hypertensive disease, with heart failure ([place]) 10/02/2014   Benign essential hypertension 09/26/2014   Chronic diastolic CHF (congestive heart failure), NYHA class 2 ([place]) 06/03/2014   Pain syndrome, chronic 03/02/2014   Diabetes ([place]) 11/08/2013   Chronic radicular low back pain 08/21/2013   Right shoulder pain 04/03/2013  Urinary tract infection 03/16/2012   Balanoposthitis 02/13/2012   History of urinary stone 02/13/2012   Hypertrophy of prostate with urinary obstruction and other lower urinary tract symptoms (LUTS) 02/13/2012   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   Redundant prepuce and phimosis 02/13/2012   Urge incontinence 02/13/2012   Right foot pain 01/18/2012   Chronic, continuous use of opioids 08/22/2011    Past Surgical History:    Procedure Laterality Date   AMPUTATION TOE Left 04/19/2018   Procedure: AMPUTATION TOE-LEFT GREAT TOE;  Surgeon: Sharlotte Alamo, DPM;  Location: [facility];  Service: Podiatry;  Laterality: Left;   CARDIAC CATHETERIZATION N/A 12/19/2014   Procedure: Coronary Stent Intervention;  Surgeon: Charolette Forward, MD;  Location: [place] [facility];  Service: Cardiovascular;  Laterality: N/A;   CARDIAC CATHETERIZATION Left 12/19/2014   Procedure: Left Heart Cath and Coronary Angiography;  Surgeon: Dionisio Reichen, MD;  Location: [place] [facility];  Service: Cardiovascular;  Laterality: Left;   IRRIGATION AND DEBRIDEMENT FOOT Left 03/02/2018   Procedure: IRRIGATION AND DEBRIDEMENT FOOT;  Surgeon: Samara Deist, DPM;  Location: [facility];  Service: Podiatry;  Laterality: Left;   IRRIGATION AND DEBRIDEMENT FOOT Left 03/08/2018   Procedure: IRRIGATION AND DEBRIDEMENT FOOT AND BONE;  Surgeon: Sharlotte Alamo, DPM;  Location: [facility];  Service: Podiatry;  Laterality: Left;   IRRIGATION AND DEBRIDEMENT FOOT Left 04/19/2018   Procedure: IRRIGATION AND DEBRIDEMENT FOOT;  Surgeon: Sharlotte Alamo, DPM;  Location: [facility];  Service: Podiatry;  Laterality: Left;   LOWER EXTREMITY ANGIOGRAPHY Left 03/05/2018   Procedure: Lower Extremity Angiography;  Surgeon: Algernon Huxley, MD;  Location: [place] [facility];  Service: Cardiovascular;  Laterality: Left;   LOWER EXTREMITY ANGIOGRAPHY Left 03/08/2018   Procedure: LOWER EXTREMITY ANGIOGRAPHY;  Surgeon: Algernon Huxley, MD;  Location: [place] [facility];  Service: Cardiovascular;  Laterality: Left;   ORIF ANKLE FRACTURE Left 01/31/2019   Procedure: open reduction internal fixation left bimalleolar ankle fracture;  Surgeon: Thornton Park, MD;  Location: [facility];  Service: Orthopedics;  Laterality: Left;   PACEMAKER INSERTION Left 11/02/2015   Procedure: INSERTION PACEMAKER;  Surgeon: Isaias Cowman, MD;  Location: [facility];  Service: Cardiovascular;  Laterality: Left;    Pain Stimulator     Right toe amputation      Prior to Admission medications   Medication Sig Start Date End Date Taking? Authorizing Provider  acetaminophen (TYLENOL) 650 MG CR tablet Take 650 mg by mouth 2 (two) times daily.    [provider]  Amino Acids-Protein Hydrolys (FEEDING SUPPLEMENT, PRO-STAT SUGAR FREE 64,) LIQD Take 30 mLs by mouth daily.    [provider]  amLODipine (NORVASC) 10 MG tablet TAKE 1 TABLET BY MOUTH ONCE DAILY Patient taking differently: Take 10 mg by mouth daily.  12/03/18   Kendell Bane, NP  aspirin EC 81 MG tablet Take 81 mg by mouth daily.    [provider]  benazepril (LOTENSIN) 20 MG tablet TAKE 1 TABLET BY MOUTH TWICE DAILY Patient taking differently: Take 20 mg by mouth 2 (two) times daily.  02/27/19   Kendell Bane, NP  bisacodyl (DULCOLAX) 10 MG suppository Place 1 suppository (10 mg total) rectally daily as needed for moderate constipation. 02/07/19   Thornton Park, MD  cetirizine (ZYRTEC) 10 MG tablet Take 10 mg by mouth daily as needed for allergies.    [provider]  clopidogrel (PLAVIX) 75 MG tablet Take 1 tablet (75 mg total) by mouth daily. 06/28/18  Ronnell Freshwater, NP  DULoxetine (CYMBALTA) 30 MG capsule Take 30 mg by mouth every evening.    [provider]  DULoxetine (CYMBALTA) 60 MG capsule Take 60 mg by mouth daily.    [provider]  Exenatide ER (BYDUREON) 2 MG PEN Inject 2 mg into the skin once a week. Patient taking differently: Inject 2 mg into the skin every Sunday.  11/26/18   Kendell Bane, NP  ezetimibe (ZETIA) 10 MG tablet Take 10 mg by mouth daily.  10/29/18   [provider]  fenofibrate 54 MG tablet TAKE 1 TABLET BY MOUTH A DAY AT SUPPERTIME Patient taking differently: Take 54 mg by mouth daily at 6 PM.  12/03/18   Scarboro, Audie Clear, NP  fluticasone (FLONASE) 50 MCG/ACT nasal spray Place 2 sprays into both nostrils daily as needed for allergies.      [provider]  furosemide (LASIX) 20 MG tablet Take 20 mg by mouth daily. 10/01/18   [provider]  glucose blood (TRUE METRIX BLOOD GLUCOSE TEST) test strip Use as instructed three times a day diag e11.65 08/13/18   Ronnell Freshwater, NP  hydrochlorothiazide (HYDRODIURIL) 12.5 MG tablet Take 1 tablet (12.5 mg total) by mouth daily. 01/31/19   Ronnell Freshwater, NP  insulin NPH Human (NOVOLIN N) 100 UNIT/ML injection Inject 45 Units into the skin 2 (two) times daily.    [provider]  Insulin Pen Needle (B-D UF III MINI PEN NEEDLES) 31G X 5 MM MISC Use as directed with insulin e11.65 12/26/17   Ronnell Freshwater, NP  insulin regular (NOVOLIN R) 100 units/mL injection Patient to use Novolin R Corrigan TiD prior to meals per sliding scale instructions. Max daily dose is 45 units per day. Patient taking differently: Inject 1-9 Units into the skin 3 (three) times daily. If bs is 120-150= 1 unit, 151-200=2 units, 201-250= 3 units, 251-300 = 5 units, 301-350= 7 units, 351-400= 9 units, >400 call md 06/22/18   Kendell Bane, NP  INSULIN SYRINGE 1CC/29G 29G X 1/2" 1 ML MISC Insulin injections QID, use with lantus and regular insulin. DX E11.65 07/05/18   Kendell Bane, NP  Insulin Syringe-Needle U-100 (BD INSULIN SYRINGE U/F 1/2UNIT) 31G X 5/16" 0.3 ML MISC Indulin injection QID. Use with lantus and regular insulin Dx. 11.65 01/08/18   Lavera Guise, MD  lactulose [facility]) 10 GM/15ML solution Take 20 g by mouth See admin instructions. Take 20 g in the evening, may take an additional 20 g up to 2 times daily as needed for constipation    [provider]  lidocaine (LIDODERM) 5 % Place 1 patch onto the skin daily as needed (pain). Remove & Discard patch within 12 hours or as directed by MD    [provider]  linaclotide (LINZESS) 145 MCG CAPS capsule Take 290 mcg by mouth daily before breakfast.    [provider]  Multiple Vitamin (MULTIVITAMIN WITH  MINERALS) TABS tablet Take 1 tablet by mouth daily.    [provider]  NARCAN 4 MG/0.1ML LIQD Place 4 mg into the nose as needed (for opioid overdose).     [provider]  omega-3 acid ethyl esters (LOVAZA) 1 g capsule Take 1 capsule (1 g total) by mouth 2 (two) times daily. 01/24/19   Kendell Bane, NP  oxcarbazepine (TRILEPTAL) 600 MG tablet Take 600 mg by mouth 2 (two) times daily.    [provider]  oxyCODONE (OXY  IR/ROXICODONE) 5 MG immediate release tablet Take 1-2 tablets (5-10 mg total) by mouth every 4 (four) hours as needed for moderate pain (pain score 4-6). 02/07/19   Thornton Park, MD  oxyCODONE (OXYCONTIN) 20 mg 12 hr tablet Take 1 tablet (20 mg total) by mouth every 12 (twelve) hours. 02/07/19   Thornton Park, MD  pantoprazole (PROTONIX) 40 MG tablet TAKE 1 TABLET BY MOUTH ONCE DAILY FOR REFLUX Patient taking differently: Take 40 mg by mouth daily.  02/01/19   Scarboro, Audie Clear, NP  polyethylene glycol (MIRALAX / Floria Raveling) packet Take 17 g by mouth 2 (two) times daily.     [provider]  tamsulosin (FLOMAX) 0.4 MG CAPS capsule TAKE 1 CAPSULE BY MOUTH ONCE DAILY AFTERSUPPER Patient taking differently: Take 0.4 mg by mouth daily at 6 PM.  02/01/19   Scarboro, Audie Clear, NP  vitamin C (ASCORBIC ACID) 250 MG tablet Take 500 mg by mouth 2 (two) times daily.    [provider]    Allergies Amoxicillin, Other, Penicillins, Hydralazine, Cephalexin, Clindamycin, Crestor [rosuvastatin calcium], Metoprolol, Red dye, Statins, Sulfa antibiotics, Yellow dyes (non-tartrazine), Aspirin, and Tape  Family History  Problem Relation Age of Onset   Breast cancer Mother    Cancer Mother    Hypertension Mother    Lung cancer Father    Hypertension Father    Heart disease Father    Cancer Father    Kidney disease Sister    Prostate cancer Neg Hx     Social History Social History   Tobacco Use   Smoking status: Former Smoker     Types: Cigarettes   Smokeless tobacco: Never Used   Tobacco comment: quit 45 years  Substance Use Topics   Alcohol use: Not Currently    Alcohol/week: 0.0 standard drinks    Comment: have not had alcohol in 78yrs   Drug use: No    Review of Systems  Constitutional: No fever/chills Eyes: No visual changes. ENT: No sore throat. Cardiovascular: Denies chest pain. Respiratory: Denies shortness of breath. Gastrointestinal: No abdominal pain.  No nausea, no vomiting.  No diarrhea.  No constipation. Genitourinary: Negative for dysuria. Musculoskeletal: Negative for back pain. Skin: Negative for rash. Neurological: Negative for headaches, focal weakness   ____________________________________________   PHYSICAL EXAM:  VITAL SIGNS: ED Triage Vitals  Enc Vitals Group     BP 04/10/19 0839 (!) 126/59     Pulse Rate 04/10/19 0839 65     Resp 04/10/19 0839 20     Temp 04/10/19 0839 98.4 F (36.9 C)     Temp Source 04/10/19 0839 Oral     SpO2 04/10/19 0839 99 %     Weight 04/10/19 0840 270 lb (122.5 kg)     Height 04/10/19 0840 5\' 8"  (1.727 m)     Head Circumference --      [place] --      Pain Score 04/10/19 0845 10     Pain Loc --      Pain Edu? --      Excl. in [place]? --     Constitutional: Alert and oriented.  Complaining of pain in his ankle.  Patient is diffusely weak and unable to stand by himself.  He requires Hoyer lift to get him into the bed. Eyes: Conjunctivae are normal.  Head: Atraumatic. Nose: No congestion/rhinnorhea. Mouth/Throat: Mucous membranes are moist.  Oropharynx non-erythematous. Neck: No stridor. Cardiovascular: Normal rate, regular rhythm. Grossly normal heart sounds.  Good peripheral circulation. Respiratory:  Normal respiratory effort.  No retractions. Lungs CTAB. Gastrointestinal: Soft and nontender. No distention. No abdominal bruits. No CVA tenderness. Musculoskeletal:     Left leg with open wound screw was visible in the wound.  There is also  amputation of the great toe on the foot. Neurologic:  Normal speech and language. No gross focal neurologic deficits are appreciated. No gait instability. Skin:  Skin is warm, dry and intact. No rash noted. Psychiatric: Mood and affect are normal. Speech and behavior are normal.  ____________________________________________   LABS (all labs ordered are listed, but only abnormal results are displayed)  Labs Reviewed  COMPREHENSIVE METABOLIC PANEL  CBC WITH DIFFERENTIAL/PLATELET  SEDIMENTATION RATE   ____________________________________________  EKG   ____________________________________________  RADIOLOGY  ED MD interpretation:   Official radiology report(s): No results found.  ____________________________________________   PROCEDURES  Procedure(s) performed (including Critical Care):  Procedures   ____________________________________________   INITIAL IMPRESSION / ASSESSMENT AND PLAN / ED COURSE  Discussed with Dr. Mack Guise he will admit the patient.  He wants consult from medicine however.  I will put that in.  Get some antibiotics although he appears to be allergic to penicillin amoxicillin cephalexin clindamycin and sulfa.              ____________________________________________   FINAL CLINICAL IMPRESSION(S) / ED DIAGNOSES  Final diagnoses:  Wound infection after surgery     ED Discharge Orders    None       Note:  This document was prepared using Dragon voice recognition software and may include unintentional dictation errors.    Nena Polio, MD 04/10/19 0945    Nena Polio, MD 04/10/19 1046

## 2019-04-10 NOTE — ED Notes (Signed)
Ok to hold off on putting foley in per dr Mack Guise.

## 2019-04-11 DIAGNOSIS — M86172 Other acute osteomyelitis, left ankle and foot: Secondary | ICD-10-CM

## 2019-04-11 DIAGNOSIS — T847XXD Infection and inflammatory reaction due to other internal orthopedic prosthetic devices, implants and grafts, subsequent encounter: Secondary | ICD-10-CM | POA: Diagnosis not present

## 2019-04-11 DIAGNOSIS — I10 Essential (primary) hypertension: Secondary | ICD-10-CM | POA: Diagnosis not present

## 2019-04-11 DIAGNOSIS — N400 Enlarged prostate without lower urinary tract symptoms: Secondary | ICD-10-CM

## 2019-04-11 DIAGNOSIS — M869 Osteomyelitis, unspecified: Secondary | ICD-10-CM

## 2019-04-11 DIAGNOSIS — E1169 Type 2 diabetes mellitus with other specified complication: Secondary | ICD-10-CM | POA: Diagnosis not present

## 2019-04-11 LAB — BASIC METABOLIC PANEL
Anion gap: 7 (ref 5–15)
BUN: 14 mg/dL (ref 8–23)
CO2: 27 mmol/L (ref 22–32)
Calcium: 8.7 mg/dL — ABNORMAL LOW (ref 8.9–10.3)
Chloride: 106 mmol/L (ref 98–111)
Creatinine, Ser: 0.66 mg/dL (ref 0.61–1.24)
GFR calc Af Amer: >60 mL/min (ref >60)
GFR calc non Af Amer: >60 mL/min (ref >60)
Glucose, Bld: 165 mg/dL — ABNORMAL HIGH (ref 70–99)
Potassium: 3.7 mmol/L (ref 3.5–5.1)
Sodium: 140 mmol/L (ref 135–145)

## 2019-04-11 LAB — CBC
HCT: 29.7 % — ABNORMAL LOW (ref 39.0–52.0)
Hemoglobin: 9.3 g/dL — ABNORMAL LOW (ref 13.0–17.0)
MCH: 24.7 pg — ABNORMAL LOW (ref 26.0–34.0)
MCHC: 31.3 g/dL (ref 30.0–36.0)
MCV: 78.8 fL — ABNORMAL LOW (ref 80.0–100.0)
Platelets: 301 10*3/uL (ref 150–400)
RBC: 3.77 MIL/uL — ABNORMAL LOW (ref 4.22–5.81)
RDW: 16.4 % — ABNORMAL HIGH (ref 11.5–15.5)
WBC: 5.1 10*3/uL (ref 4.0–10.5)
nRBC: 0 % (ref 0.0–0.2)

## 2019-04-11 LAB — GLUCOSE, CAPILLARY
Glucose-Capillary: 201 mg/dL — ABNORMAL HIGH (ref 70–99)
Glucose-Capillary: 193 mg/dL — ABNORMAL HIGH (ref 70–99)
Glucose-Capillary: 217 mg/dL — ABNORMAL HIGH (ref 70–99)

## 2019-04-11 MED ORDER — DULOXETINE HCL 30 MG PO CPEP
30.0000 mg | ORAL_CAPSULE | Freq: Every evening | ORAL | Status: DC
  Administered 2019-04-11 – 2019-04-22 (×11): 30 mg via ORAL
  Filled 2019-04-11 (×13): qty 1

## 2019-04-11 MED ORDER — OXYCODONE HCL ER 10 MG PO T12A
20.0000 mg | EXTENDED_RELEASE_TABLET | Freq: Two times a day (BID) | ORAL | Status: DC
  Administered 2019-04-11 – 2019-04-23 (×21): 20 mg via ORAL
  Filled 2019-04-11 (×25): qty 2

## 2019-04-11 MED ORDER — ASPIRIN EC 81 MG PO TBEC
81.0000 mg | DELAYED_RELEASE_TABLET | Freq: Every day | ORAL | Status: DC
  Administered 2019-04-11 – 2019-04-23 (×11): 81 mg via ORAL
  Filled 2019-04-11 (×12): qty 1

## 2019-04-11 MED ORDER — ADULT MULTIVITAMIN W/MINERALS CH
1.0000 | ORAL_TABLET | Freq: Every day | ORAL | Status: DC
  Administered 2019-04-11 – 2019-04-23 (×10): 1 via ORAL
  Filled 2019-04-11 (×12): qty 1

## 2019-04-11 MED ORDER — HYDROCHLOROTHIAZIDE 25 MG PO TABS
12.5000 mg | ORAL_TABLET | Freq: Every day | ORAL | Status: DC
  Administered 2019-04-11 – 2019-04-21 (×8): 12.5 mg via ORAL
  Filled 2019-04-11 (×10): qty 1

## 2019-04-11 MED ORDER — TAMSULOSIN HCL 0.4 MG PO CAPS
0.4000 mg | ORAL_CAPSULE | Freq: Every day | ORAL | Status: DC
  Administered 2019-04-11 – 2019-04-22 (×11): 0.4 mg via ORAL
  Filled 2019-04-11 (×11): qty 1

## 2019-04-11 MED ORDER — VITAMIN C 500 MG PO TABS
500.0000 mg | ORAL_TABLET | Freq: Two times a day (BID) | ORAL | Status: DC
  Administered 2019-04-11 – 2019-04-23 (×22): 500 mg via ORAL
  Filled 2019-04-11 (×24): qty 1

## 2019-04-11 MED ORDER — LINACLOTIDE 290 MCG PO CAPS
290.0000 ug | ORAL_CAPSULE | Freq: Every day | ORAL | Status: DC
  Administered 2019-04-11 – 2019-04-23 (×11): 290 ug via ORAL
  Filled 2019-04-11 (×13): qty 1

## 2019-04-11 MED ORDER — LIDOCAINE 5 % EX PTCH
1.0000 | MEDICATED_PATCH | Freq: Every day | CUTANEOUS | Status: DC | PRN
  Filled 2019-04-11: qty 1

## 2019-04-11 MED ORDER — FUROSEMIDE 20 MG PO TABS
20.0000 mg | ORAL_TABLET | Freq: Every day | ORAL | Status: DC
  Administered 2019-04-11 – 2019-04-23 (×10): 20 mg via ORAL
  Filled 2019-04-11 (×11): qty 1

## 2019-04-11 MED ORDER — INSULIN DETEMIR 100 UNIT/ML ~~LOC~~ SOLN
22.0000 [IU] | Freq: Two times a day (BID) | SUBCUTANEOUS | Status: DC
  Administered 2019-04-11: 22 [IU] via SUBCUTANEOUS
  Filled 2019-04-11 (×2): qty 0.22

## 2019-04-11 MED ORDER — NALOXONE HCL 4 MG/0.1ML NA LIQD
4.0000 mg | NASAL | Status: DC | PRN
  Filled 2019-04-11: qty 8

## 2019-04-11 MED ORDER — PRO-STAT SUGAR FREE PO LIQD
30.0000 mL | Freq: Every day | ORAL | Status: DC
  Administered 2019-04-11 – 2019-04-23 (×9): 30 mL via ORAL

## 2019-04-11 MED ORDER — INSULIN REGULAR HUMAN 100 UNIT/ML IJ SOLN: 1.0000 [IU] | Freq: Three times a day (TID) | INTRAMUSCULAR | Status: DC

## 2019-04-11 MED ORDER — BENAZEPRIL HCL 20 MG PO TABS
20.0000 mg | ORAL_TABLET | Freq: Two times a day (BID) | ORAL | Status: DC
  Administered 2019-04-11 – 2019-04-22 (×21): 20 mg via ORAL
  Filled 2019-04-11 (×26): qty 1

## 2019-04-11 MED ORDER — INSULIN ASPART 100 UNIT/ML ~~LOC~~ SOLN
0.0000 [IU] | Freq: Three times a day (TID) | SUBCUTANEOUS | Status: DC
  Administered 2019-04-11: 18:00:00 4 [IU] via SUBCUTANEOUS
  Administered 2019-04-11 – 2019-04-13 (×3): 7 [IU] via SUBCUTANEOUS
  Administered 2019-04-13 – 2019-04-14 (×2): 3 [IU] via SUBCUTANEOUS
  Administered 2019-04-14 – 2019-04-15 (×4): 4 [IU] via SUBCUTANEOUS
  Administered 2019-04-15: 17:00:00 11 [IU] via SUBCUTANEOUS
  Administered 2019-04-17 – 2019-04-18 (×4): 3 [IU] via SUBCUTANEOUS
  Administered 2019-04-19 (×3): 4 [IU] via SUBCUTANEOUS
  Administered 2019-04-20: 3 [IU] via SUBCUTANEOUS
  Administered 2019-04-20 (×2): 4 [IU] via SUBCUTANEOUS
  Administered 2019-04-21: 13:00:00 7 [IU] via SUBCUTANEOUS
  Administered 2019-04-21 – 2019-04-22 (×3): 4 [IU] via SUBCUTANEOUS
  Administered 2019-04-22: 17:00:00 7 [IU] via SUBCUTANEOUS
  Administered 2019-04-22 – 2019-04-23 (×2): 3 [IU] via SUBCUTANEOUS
  Administered 2019-04-23: 4 [IU] via SUBCUTANEOUS
  Filled 2019-04-11 (×31): qty 1

## 2019-04-11 MED ORDER — POLYETHYLENE GLYCOL 3350 17 G PO PACK
17.0000 g | PACK | Freq: Two times a day (BID) | ORAL | Status: DC
  Administered 2019-04-11 – 2019-04-15 (×7): 17 g via ORAL
  Filled 2019-04-11 (×8): qty 1

## 2019-04-11 MED ORDER — DULOXETINE HCL 60 MG PO CPEP
60.0000 mg | ORAL_CAPSULE | Freq: Every day | ORAL | Status: DC
  Administered 2019-04-11 – 2019-04-23 (×11): 60 mg via ORAL
  Filled 2019-04-11 (×13): qty 1

## 2019-04-11 MED ORDER — VANCOMYCIN HCL 1.25 G IV SOLR
1250.0000 mg | Freq: Two times a day (BID) | INTRAVENOUS | Status: DC
  Administered 2019-04-11 – 2019-04-14 (×5): 1250 mg via INTRAVENOUS
  Filled 2019-04-11 (×8): qty 1250

## 2019-04-11 MED ORDER — OMEGA-3-ACID ETHYL ESTERS 1 G PO CAPS
1.0000 g | ORAL_CAPSULE | Freq: Two times a day (BID) | ORAL | Status: DC
  Administered 2019-04-11 – 2019-04-23 (×23): 1 g via ORAL
  Filled 2019-04-11 (×25): qty 1

## 2019-04-11 MED ORDER — OXCARBAZEPINE 300 MG PO TABS
600.0000 mg | ORAL_TABLET | Freq: Two times a day (BID) | ORAL | Status: DC
  Administered 2019-04-11 – 2019-04-23 (×22): 600 mg via ORAL
  Filled 2019-04-11 (×26): qty 2

## 2019-04-11 MED ORDER — PREGABALIN 75 MG PO CAPS
400.0000 mg | ORAL_CAPSULE | Freq: Two times a day (BID) | ORAL | Status: DC
  Administered 2019-04-11 – 2019-04-15 (×8): 400 mg via ORAL
  Filled 2019-04-11 (×9): qty 5

## 2019-04-11 MED ORDER — LEVOFLOXACIN IN D5W 500 MG/100ML IV SOLN
500.0000 mg | INTRAVENOUS | Status: DC
  Administered 2019-04-11: 13:00:00 500 mg via INTRAVENOUS
  Filled 2019-04-11 (×2): qty 100

## 2019-04-11 MED ORDER — EZETIMIBE 10 MG PO TABS
10.0000 mg | ORAL_TABLET | Freq: Every day | ORAL | Status: DC
  Administered 2019-04-11 – 2019-04-23 (×11): 10 mg via ORAL
  Filled 2019-04-11 (×12): qty 1

## 2019-04-11 MED ORDER — INSULIN DETEMIR 100 UNIT/ML ~~LOC~~ SOLN
45.0000 [IU] | Freq: Two times a day (BID) | SUBCUTANEOUS | Status: DC
  Administered 2019-04-11: 11:00:00 45 [IU] via SUBCUTANEOUS
  Filled 2019-04-11 (×2): qty 0.45

## 2019-04-11 MED ORDER — INSULIN NPH (HUMAN) (ISOPHANE) 100 UNIT/ML ~~LOC~~ SUSP: 45.0000 [IU] | Freq: Two times a day (BID) | SUBCUTANEOUS | Status: DC

## 2019-04-11 MED ORDER — EXENATIDE ER 2 MG ~~LOC~~ PEN: 2.0000 mg | PEN_INJECTOR | SUBCUTANEOUS | Status: DC

## 2019-04-11 MED ORDER — LACTULOSE 10 GM/15ML PO SOLN: 20.0000 g | ORAL | Status: DC

## 2019-04-11 MED ORDER — FENOFIBRATE 54 MG PO TABS
54.0000 mg | ORAL_TABLET | Freq: Every day | ORAL | Status: DC
  Administered 2019-04-11 – 2019-04-22 (×9): 54 mg via ORAL
  Filled 2019-04-11 (×13): qty 1

## 2019-04-11 MED ORDER — AMLODIPINE BESYLATE 10 MG PO TABS
10.0000 mg | ORAL_TABLET | Freq: Every day | ORAL | Status: DC
  Administered 2019-04-11 – 2019-04-23 (×9): 10 mg via ORAL
  Filled 2019-04-11 (×11): qty 1

## 2019-04-11 MED ORDER — PANTOPRAZOLE SODIUM 40 MG PO TBEC
40.0000 mg | DELAYED_RELEASE_TABLET | Freq: Every day | ORAL | Status: DC
  Administered 2019-04-11 – 2019-04-23 (×11): 40 mg via ORAL
  Filled 2019-04-11 (×12): qty 1

## 2019-04-11 NOTE — Consult Note (Signed)
Pharmacy Antibiotic Note  Victor Wise is a 68 y.o. male admitted on 04/10/2019 with left ankle osteomyelitis with hardware infection.  Pharmacy has been consulted for Vancomycin dosing. Patient received Vancomycin 1g x1 11/25 at around noon. However, per nurse, patient experience red splotchy areas to right arm after Vancomycin was started and the medication was stopped. Nurse notified pharmacy and attending of reaction. Notably, patient has been on Vancomycin with no prior issues related to that event. Patient will go to the operating room tomorrow to remove hardware.  Plan: 1. Vancomycin  1250 mg IV every 12 hours AUC goal: 400-550 Expected AUC: 534.6 SCr used: 0.80 (rouded up) Cssmin: 15.3 T1/2: 9.1 h BMI 41.1  Height: 5\' 8"  (172.7 cm) Weight: 270 lb (122.5 kg) IBW/kg (Calculated) : 68.4  Temp (24hrs), Avg:98.2 F (36.8 C), Min:97.9 F (36.6 C), Max:98.6 F (37 C)  Recent Labs  Lab 04/10/19 0917 04/11/19 0551  WBC 6.8 5.1  CREATININE 0.87 0.66    Estimated Creatinine Clearance: 114.1 mL/min (by C-G formula based on SCr of 0.66 mg/dL).    Allergies  Allergen Reactions  . Amoxicillin Other (See Comments)    Has never taken amoxicillin -ENT told him not to take (? Had skin test) Has patient had a PCN reaction causing immediate rash, facial/tongue/throat swelling, SOB or lightheadedness with hypotension: No Has patient had a PCN reaction causing severe rash involving mucus membranes or skin necrosis: No Has patient had a PCN reaction that required hospitalization No Has patient had a PCN reaction occurring within the last 10 years: No If above answers are "NO", then may proceed w/ Cephalo  . Other Anaphylaxis, Itching and Other (See Comments)    Pt states that he is allergic to Endopa.  Reaction:  Anaphylaxis  Pt states that he is allergic to Metabisulfites Reaction:  Itching   . Penicillins Other (See Comments)    Arm turned "blue" at injection site (no treatment  needed) Has patient had a PCN reaction causing immediate rash, facial/tongue/throat swelling, SOB or lightheadedness with hypotension: No Has patient had a PCN reaction causing severe rash involving mucus membranes or skin necrosis: No Has patient had a PCN reaction that required hospitalization No Has patient had a PCN reaction occurring within the last 10 years: No If all of the above answers are "NO", then may proceed with Cephalosporin use.  [name] Hydralazine Other (See Comments)    Reaction:  Cramping of extremities Pt reports tolerating low dose 12.5mg  but no higher.  . Cephalexin Itching  . Clindamycin Itching  . Crestor [Rosuvastatin Calcium] Other (See Comments)    Reaction:  Pt is unable to move arms/legs   . Metoprolol Nausea And Vomiting  . Red Dye Itching  . Statins Other (See Comments)    Reaction:  Pt is unable to move arms/legs  . Sulfa Antibiotics Itching    Other reaction(s): Unknown  . Yellow Dyes (Non-Tartrazine) Itching  . Aspirin Itching and Other (See Comments)    Pt states that he is able to use in lower doses.    . Tape Rash    Please use "paper" tape only.    Antimicrobials this admission: 11/25 vancomycin >>  11/26 levofloxacin >>  Microbiology results: 11/25 UCx: E coli (sensitivities pending)  11/23 SARS CoV-2: negative   Thank you for allowing pharmacy to be a part of this patient's care.  Dallie Piles, PharmD 04/11/2019 2:21 PM

## 2019-04-11 NOTE — Progress Notes (Signed)
D: Pt alert and oriented. Pt's pain managed with PRN medications.  A: Scheduled medications administered to pt, per MD orders. Support and encouragement provided. Frequent verbal contact made.  R: No adverse drug reactions noted. Pt complaint with medications and treatment plan. Pt interacts well with staff on the unit. Pt is stable at this time, will continue to monitor and provided care for as ordered.

## 2019-04-11 NOTE — Progress Notes (Signed)
Patient ID: Victor Wise, male   DOB: 28-Oct-1950, 68 y.o.   MRN: [id number] [facility] PROGRESS NOTE  ROSA GUILBAULT [id number] DOB: 01-25-1951 DOA: 04/10/2019 PCP: Lavera Guise, MD  HPI/Subjective: Patient states he is in so much pain and did not sleep last night.  Severe pain in his left ankle.  He also states that his right knee gives out on him and that is how he hurt his left ankle.  Had some diarrhea.  He does live alone.  Objective: Vitals:   04/11/19 0823 04/11/19 1040  BP: (!) 169/99 (!) 145/72  Pulse: 63 60  Resp:    Temp: 98.1 F (36.7 C)   SpO2: 100% 100%    Intake/Output Summary (Last 24 hours) at 04/11/2019 1157 Last data filed at 04/11/2019 1107 Gross per 24 hour  Intake 1532.65 ml  Output 2075 ml  Net -542.35 ml   Filed Weights   04/10/19 0840  Weight: 122.5 kg    ROS: Review of Systems  Constitutional: Negative for chills and fever.  Eyes: Negative for blurred vision.  Respiratory: Negative for cough and shortness of breath.   Cardiovascular: Negative for chest pain.  Gastrointestinal: Positive for diarrhea. Negative for abdominal pain, constipation, nausea and vomiting.  Genitourinary: Negative for dysuria.  Musculoskeletal: Positive for joint pain.  Neurological: Negative for dizziness and headaches.   Exam: Physical Exam  HENT:  Nose: No mucosal edema.  Mouth/Throat: No oropharyngeal exudate or posterior oropharyngeal edema.  Eyes: Pupils are equal, round, and reactive to light. Conjunctivae, EOM and lids are normal.  Neck: No JVD present. Carotid bruit is not present. No edema present. No thyroid mass and no thyromegaly present.  Cardiovascular: S1 normal and S2 normal. Exam reveals no gallop.  No murmur heard. Pulses:      Dorsalis pedis pulses are 2+ on the right side and 2+ on the left side.  Respiratory: No respiratory distress. He has no wheezes. He has no rhonchi. He has no rales.  GI: Soft. Bowel sounds are normal. There is no  abdominal tenderness.  Musculoskeletal:     Right ankle: He exhibits no swelling.     Left ankle: He exhibits no swelling.  Lymphadenopathy:    He has no cervical adenopathy.  Neurological: He is alert. No cranial nerve deficit.  Skin: Skin is warm. Nails show no clubbing.  Left ankle wrapped.  Psychiatric: He has a normal mood and affect.      Data Reviewed: Basic Metabolic Panel: Recent Labs  Lab 04/10/19 0917 04/11/19 0551  NA 139 140  K 3.9 3.7  CL 101 106  CO2 26 27  GLUCOSE 177* 165*  BUN 18 14  CREATININE 0.87 0.66  CALCIUM 9.1 8.7*   Liver Function Tests: Recent Labs  Lab 04/10/19 0917  AST 17  ALT 14  ALKPHOS 64  BILITOT 0.5  PROT 7.6  ALBUMIN 3.4*   CBC: Recent Labs  Lab 04/10/19 0917 04/11/19 0551  WBC 6.8 5.1  NEUTROABS 4.1  --   HGB 10.6* 9.3*  HCT 33.3* 29.7*  MCV 76.7* 78.8*  PLT 393 301     Recent Results (from the past 240 hour(s))  SARS CORONAVIRUS 2 (TAT 6-24 HRS) Nasopharyngeal Nasopharyngeal Swab     Status: None   Collection Time: 04/08/19  1:20 PM   Specimen: Nasopharyngeal Swab  Result Value Ref Range Status   SARS Coronavirus 2 NEGATIVE NEGATIVE Final    Comment: (NOTE) SARS-CoV-2 target nucleic acids are NOT  DETECTED. The SARS-CoV-2 RNA is generally detectable in upper and lower respiratory specimens during the acute phase of infection. Negative results do not preclude SARS-CoV-2 infection, do not rule out co-infections with other pathogens, and should not be used as the sole basis for treatment or other patient management decisions. Negative results must be combined with clinical observations, patient history, and epidemiological information. The expected result is Negative. Fact Sheet for Patients: SugarRoll.be Fact Sheet for Healthcare Providers: https://www.woods-mathews.com/ This test is not yet approved or cleared by the [place] FDA and  has been authorized for  detection and/or diagnosis of SARS-CoV-2 by FDA under an Emergency Use Authorization (EUA). This EUA will remain  in effect (meaning this test can be used) for the duration of the COVID-19 declaration under Section 56 4(b)(1) of the Act, 21 U.S.C. section 360bbb-3(b)(1), unless the authorization is terminated or revoked sooner. Performed at [facility], [place] [address] [place], [place] [address]   Urine culture     Status: Abnormal (Preliminary result)   Collection Time: 04/10/19 11:28 AM   Specimen: Urine, Random  Result Value Ref Range Status   Specimen Description   Final    URINE, RANDOM Performed at [facility], [address] [place], [place] [address]    Special Requests   Final    NONE Performed at [facility], [address] [place], [place] [address]    Culture (A)  Final    >=100,000 COLONIES/mL GRAM NEGATIVE RODS IDENTIFICATION AND SUSCEPTIBILITIES TO FOLLOW Performed at [facility], [place] [address], [place], [place] [address]    Report Status PENDING  Incomplete     Studies: Dg Ankle Complete Left  Result Date: 04/10/2019 CLINICAL DATA:  Left ankle pain and swelling. Skin ulceration on the lateral aspect of the ankle. History of prior fracture fixation. EXAM: LEFT ANKLE COMPLETE - 3+ VIEW COMPARISON:  Plain films left ankle 03/23/2019. FINDINGS: Plate and screw fixation of a distal fibular fracture is identified. There is a large skin ulceration over the distal aspect of the plate. Lucency in bone at the level of and interfragmentary screw and distal aspect of the plate is worrisome for osteomyelitis. The patient also has 2 screws in the medial malleolus for fixation of a fracture. No bridging bone is identified and there is lucency about the screws. Cortical destruction is seen in the anterior aspect of the distal tibia. There is an ankle joint effusion. Soft tissues about the ankle are swollen. IMPRESSION: Findings highly  suspicious for septic ankle joint with osteomyelitis in both the distal tibia and lateral malleolus as described above. Electronically Signed   By: Inge Rise M.D.   On: 04/10/2019 09:51    Scheduled Meds: . amLODipine  10 mg Oral Daily  . aspirin EC  81 mg Oral Daily  . benazepril  20 mg Oral BID  . DULoxetine  30 mg Oral QPM  . DULoxetine  60 mg Oral Daily  . enoxaparin (LOVENOX) injection  40 mg Subcutaneous BID  . ezetimibe  10 mg Oral Daily  . feeding supplement (PRO-STAT SUGAR FREE 64)  30 mL Oral Daily  . fenofibrate  54 mg Oral q1800  . furosemide  20 mg Oral Daily  . hydrochlorothiazide  12.5 mg Oral Daily  . insulin aspart  0-20 Units Subcutaneous TID WC  . insulin detemir  22 Units Subcutaneous BID  . lactulose  20 g Oral See admin instructions  . linaclotide  290 mcg  Oral QAC breakfast  . multivitamin with minerals  1 tablet Oral Daily  . omega-3 acid ethyl esters  1 g Oral BID  . oxcarbazepine  600 mg Oral BID  . oxyCODONE  20 mg Oral Q12H  . pantoprazole  40 mg Oral Daily  . polyethylene glycol  17 g Oral BID  . pregabalin  400 mg Oral BID  . senna  1 tablet Oral BID  . tamsulosin  0.4 mg Oral q1800  . vitamin C  500 mg Oral BID   Continuous Infusions: . sodium chloride 75 mL/hr at 04/11/19 0005  . levofloxacin (LEVAQUIN) IV      Assessment/Plan:  1. Left ankle osteomyelitis with hardware infection.  Patient had wound dehiscence and exposed hardware.  Patient will go to the operating room tomorrow to remove hardware.  Patient started initially on vancomycin.  Will add Levaquin for some gram-negative coverage. 2. Type 2 diabetes mellitus with hyperglycemia and hyperlipidemia and prior toe amputations.  Give half dose of Levemir insulin tonight because the patient will be n.p.o. tomorrow. 3. Essential hypertension on amlodipine, benazepril, hydrochlorothiazide and low-dose Lasix. 4. Chronic systolic congestive heart failure with EF of 45%.  Drop rate of IV  fluid.  No signs of heart failure at this point. 5. UTI.  Urine culture on 11/25 showing gram-negative rods.  Code Status:     Code Status Orders  (From admission, onward)         Start     Ordered   04/10/19 1050  Full code  Continuous     04/10/19 1058        Code Status History    Date Active Date Inactive Code Status Order ID Comments User Context   01/31/2019 1801 02/07/2019 2006 Full Code [id number]  Thornton Park, MD Inpatient   04/18/2018 1725 04/23/2018 2207 Full Code [id number]  Henreitta Leber, MD Inpatient   03/01/2018 2323 03/13/2018 2210 Full Code [id number]  Lance Coon, MD Inpatient   02/18/2016 1628 02/21/2016 1556 Full Code [id number]  Theodoro Grist, MD ED   10/26/2015 2111 11/03/2015 1616 Full Code [id number]  Vaughan Basta, MD Inpatient   10/19/2015 2200 10/21/2015 2351 Full Code [id number]  Gladstone Lighter, MD Inpatient   12/19/2014 2131 12/21/2014 1710 Full Code [id number]  Charolette Forward, MD Inpatient   12/19/2014 2123 12/19/2014 2131 Full Code [id number]  Charolette Forward, MD Inpatient   12/19/2014 1238 12/19/2014 1718 Full Code [id number]  Dionisio Zarius, MD Inpatient   12/18/2014 1836 12/19/2014 1238 Full Code [id number]  Henreitta Leber, MD Inpatient   Advance Care Planning Activity    Advance Directive Documentation     Most Recent Value  Type of Advance Directive  Healthcare Power of Attorney, Living will  Pre-existing out of facility DNR order (yellow form or pink MOST form)  -  "MOST" Form in Place?  -     Family Communication: Patient refused me calling family Disposition Plan: Likely will need to go back to rehab  Consultants:  Orthopedic surgery  Antibiotics:  Vancomycin  Start Levaquin  Time spent: 28 minutes  [place]

## 2019-04-11 NOTE — Progress Notes (Signed)
Subjective:  Patient admitted yesterday for left ankle wound dehiscence with exposed hardware.  Patient has been started on IV antibiotics.  Patient is complaining of left ankle pain.  He is scheduled for left ankle hardware removal, I&D and VAC dressing placement.  Patient was seen with Dr. Leslye Peer from the hospital service who is also following the patient.  Objective:   VITALS:   Vitals:   04/11/19 0006 04/11/19 0235 04/11/19 0436 04/11/19 0823  BP: (!) 150/65 (!) 147/70 (!) 150/59 (!) 169/99  Pulse: (!) 59 (!) 59 (!) 59 63  Resp: 18 14 18    Temp: 98.6 F (37 C)  97.9 F (36.6 C) 98.1 F (36.7 C)  TempSrc: Oral  Oral Oral  SpO2: 99% 97% 95% 100%  Weight:      Height:        PHYSICAL EXAM: Left lower extremity: Left foot perfused.  Patient can gently flex and extend his toes.  He has intact sensation to light touch.  His foot appears perfused.  Patient's ankle dressing remains in place with mild drainage.  LABS  Results for orders placed or performed during the hospital encounter of 04/10/19 (from the past 24 hour(s))  HIV Antibody (routine testing w rflx)     Status: None   Collection Time: 04/10/19 11:28 AM  Result Value Ref Range   HIV Screen 4th Generation wRfx NON REACTIVE NON REACTIVE  Protime-INR     Status: None   Collection Time: 04/10/19 11:28 AM  Result Value Ref Range   Prothrombin Time 13.6 11.4 - 15.2 seconds   INR 1.1 0.8 - 1.2  APTT     Status: None   Collection Time: 04/10/19 11:28 AM  Result Value Ref Range   aPTT 35 24 - 36 seconds  Type and screen If not already done in ED     Status: None   Collection Time: 04/10/19 11:29 AM  Result Value Ref Range   ABO/RH(D) O POS    Antibody Screen NEG    Sample Expiration      04/13/2019,2359 Performed at [facility], [address] [place], [place] [address]   CBC     Status: Abnormal   Collection Time: 04/11/19  5:51 AM  Result Value Ref Range   WBC 5.1 4.0 - 10.5 K/uL   RBC 3.77 (L)  4.22 - 5.81 MIL/uL   Hemoglobin 9.3 (L) 13.0 - 17.0 g/dL   HCT 29.7 (L) 39.0 - 52.0 %   MCV 78.8 (L) 80.0 - 100.0 fL   MCH 24.7 (L) 26.0 - 34.0 pg   MCHC 31.3 30.0 - 36.0 g/dL   RDW 16.4 (H) 11.5 - 15.5 %   Platelets 301 150 - 400 K/uL   nRBC 0.0 0.0 - 0.2 %  Basic metabolic panel     Status: Abnormal   Collection Time: 04/11/19  5:51 AM  Result Value Ref Range   Sodium 140 135 - 145 mmol/L   Potassium 3.7 3.5 - 5.1 mmol/L   Chloride 106 98 - 111 mmol/L   CO2 27 22 - 32 mmol/L   Glucose, Bld 165 (H) 70 - 99 mg/dL   BUN 14 8 - 23 mg/dL   Creatinine, Ser 0.66 0.61 - 1.24 mg/dL   Calcium 8.7 (L) 8.9 - 10.3 mg/dL   GFR calc non Af Amer >60 >60 mL/min   GFR calc Af Amer >60 >60 mL/min   Anion gap 7 5 - 15    Dg Ankle Complete  Left  Result Date: 04/10/2019 CLINICAL DATA:  Left ankle pain and swelling. Skin ulceration on the lateral aspect of the ankle. History of prior fracture fixation. EXAM: LEFT ANKLE COMPLETE - 3+ VIEW COMPARISON:  Plain films left ankle 03/23/2019. FINDINGS: Plate and screw fixation of a distal fibular fracture is identified. There is a large skin ulceration over the distal aspect of the plate. Lucency in bone at the level of and interfragmentary screw and distal aspect of the plate is worrisome for osteomyelitis. The patient also has 2 screws in the medial malleolus for fixation of a fracture. No bridging bone is identified and there is lucency about the screws. Cortical destruction is seen in the anterior aspect of the distal tibia. There is an ankle joint effusion. Soft tissues about the ankle are swollen. IMPRESSION: Findings highly suspicious for septic ankle joint with osteomyelitis in both the distal tibia and lateral malleolus as described above. Electronically Signed   By: Inge Rise M.D.   On: 04/10/2019 09:51    Assessment/Plan:     Principal Problem:   Infected hardware in left lower extremity (HCC) Active Problems:   Mixed hyperlipidemia    Essential hypertension   OSA on CPAP   Benign essential hypertension   Coronary artery disease due to lipid rich plaque   Diabetes (HCC)   Obesity, Class III, BMI 40-49.9 (morbid obesity) ([place])   Bimalleolar fracture of left ankle   Wound dehiscence, surgical  Patient will continue his IV antibiotics.  He will undergo surgery tomorrow morning for hardware removal, wound I&D and VAC dressing placement.  Patient will require long-term antibiotics.  Patient will be n.p.o. after midnight.  Discontinue lovenox in preparation for surgery tomorrow.  Plavix on hold.   Thornton Park , MD 04/11/2019, 9:57 AM

## 2019-04-12 ENCOUNTER — Encounter: Payer: Self-pay | Admitting: Anesthesiology

## 2019-04-12 ENCOUNTER — Encounter
Admission: EM | Disposition: A | Discharge status: Other Acute Inpatient Hospital | Payer: Self-pay | Source: Home / Self Care | Attending: Orthopedic Surgery

## 2019-04-12 ENCOUNTER — Ambulatory Visit: Admission: RE | Admit: 2019-04-12 | Payer: Medicare HMO | Source: Home / Self Care | Admitting: Orthopedic Surgery

## 2019-04-12 ENCOUNTER — Inpatient Hospital Stay: Payer: Medicare HMO | Admitting: Anesthesiology

## 2019-04-12 ENCOUNTER — Inpatient Hospital Stay: Payer: Medicare HMO

## 2019-04-12 DIAGNOSIS — Z8631 Personal history of diabetic foot ulcer: Secondary | ICD-10-CM

## 2019-04-12 DIAGNOSIS — Z950 Presence of cardiac pacemaker: Secondary | ICD-10-CM

## 2019-04-12 DIAGNOSIS — Z79899 Other long term (current) drug therapy: Secondary | ICD-10-CM

## 2019-04-12 DIAGNOSIS — T8459XA Infection and inflammatory reaction due to other internal joint prosthesis, initial encounter: Secondary | ICD-10-CM | POA: Diagnosis not present

## 2019-04-12 DIAGNOSIS — G4733 Obstructive sleep apnea (adult) (pediatric): Secondary | ICD-10-CM | POA: Diagnosis not present

## 2019-04-12 DIAGNOSIS — Z89412 Acquired absence of left great toe: Secondary | ICD-10-CM

## 2019-04-12 DIAGNOSIS — T847XXD Infection and inflammatory reaction due to other internal orthopedic prosthetic devices, implants and grafts, subsequent encounter: Secondary | ICD-10-CM | POA: Diagnosis not present

## 2019-04-12 DIAGNOSIS — M86172 Other acute osteomyelitis, left ankle and foot: Secondary | ICD-10-CM | POA: Diagnosis not present

## 2019-04-12 DIAGNOSIS — E1151 Type 2 diabetes mellitus with diabetic peripheral angiopathy without gangrene: Secondary | ICD-10-CM

## 2019-04-12 DIAGNOSIS — Z794 Long term (current) use of insulin: Secondary | ICD-10-CM

## 2019-04-12 DIAGNOSIS — Z9682 Presence of neurostimulator: Secondary | ICD-10-CM

## 2019-04-12 DIAGNOSIS — Z01818 Encounter for other preprocedural examination: Secondary | ICD-10-CM

## 2019-04-12 DIAGNOSIS — Z886 Allergy status to analgesic agent status: Secondary | ICD-10-CM

## 2019-04-12 DIAGNOSIS — E785 Hyperlipidemia, unspecified: Secondary | ICD-10-CM

## 2019-04-12 DIAGNOSIS — Z888 Allergy status to other drugs, medicaments and biological substances status: Secondary | ICD-10-CM

## 2019-04-12 DIAGNOSIS — Z89411 Acquired absence of right great toe: Secondary | ICD-10-CM

## 2019-04-12 DIAGNOSIS — I10 Essential (primary) hypertension: Secondary | ICD-10-CM | POA: Diagnosis not present

## 2019-04-12 DIAGNOSIS — Z87311 Personal history of (healed) other pathological fracture: Secondary | ICD-10-CM

## 2019-04-12 DIAGNOSIS — Z880 Allergy status to penicillin: Secondary | ICD-10-CM

## 2019-04-12 DIAGNOSIS — Z96662 Presence of left artificial ankle joint: Secondary | ICD-10-CM

## 2019-04-12 DIAGNOSIS — Z91048 Other nonmedicinal substance allergy status: Secondary | ICD-10-CM

## 2019-04-12 DIAGNOSIS — Z881 Allergy status to other antibiotic agents status: Secondary | ICD-10-CM

## 2019-04-12 DIAGNOSIS — E1169 Type 2 diabetes mellitus with other specified complication: Secondary | ICD-10-CM | POA: Diagnosis not present

## 2019-04-12 DIAGNOSIS — T8579XA Infection and inflammatory reaction due to other internal prosthetic devices, implants and grafts, initial encounter: Secondary | ICD-10-CM | POA: Diagnosis not present

## 2019-04-12 DIAGNOSIS — T8131XA Disruption of external operation (surgical) wound, not elsewhere classified, initial encounter: Secondary | ICD-10-CM | POA: Diagnosis not present

## 2019-04-12 HISTORY — PX: INCISION AND DRAINAGE OF WOUND: SHX1803

## 2019-04-12 HISTORY — PX: APPLICATION OF WOUND VAC: SHX5189

## 2019-04-12 HISTORY — PX: HARDWARE REMOVAL: SHX979

## 2019-04-12 LAB — URINE CULTURE: Culture: >=100000 — AB

## 2019-04-12 LAB — BASIC METABOLIC PANEL
Anion gap: 8 (ref 5–15)
BUN: 16 mg/dL (ref 8–23)
CO2: 27 mmol/L (ref 22–32)
Calcium: 9 mg/dL (ref 8.9–10.3)
Chloride: 106 mmol/L (ref 98–111)
Creatinine, Ser: 0.51 mg/dL — ABNORMAL LOW (ref 0.61–1.24)
GFR calc Af Amer: >60 mL/min (ref >60)
GFR calc non Af Amer: >60 mL/min (ref >60)
Glucose, Bld: 195 mg/dL — ABNORMAL HIGH (ref 70–99)
Potassium: 3.7 mmol/L (ref 3.5–5.1)
Sodium: 141 mmol/L (ref 135–145)

## 2019-04-12 LAB — GLUCOSE, CAPILLARY
Glucose-Capillary: 169 mg/dL — ABNORMAL HIGH (ref 70–99)
Glucose-Capillary: 204 mg/dL — ABNORMAL HIGH (ref 70–99)
Glucose-Capillary: 214 mg/dL — ABNORMAL HIGH (ref 70–99)
Glucose-Capillary: 221 mg/dL — ABNORMAL HIGH (ref 70–99)

## 2019-04-12 LAB — SURGICAL PCR SCREEN
MRSA, PCR: NEGATIVE
Staphylococcus aureus: POSITIVE — AB

## 2019-04-12 SURGERY — REMOVAL, HARDWARE
Anesthesia: General | Site: Ankle | Laterality: Left

## 2019-04-12 SURGERY — IRRIGATION AND DEBRIDEMENT WOUND
Anesthesia: General | Site: Ankle | Laterality: Left

## 2019-04-12 MED ORDER — CEFTRIAXONE SODIUM 2 G IJ SOLR
INTRAMUSCULAR | Status: AC
  Filled 2019-04-12: qty 20

## 2019-04-12 MED ORDER — SUCCINYLCHOLINE CHLORIDE 20 MG/ML IJ SOLN
INTRAMUSCULAR | Status: AC
  Filled 2019-04-12: qty 1

## 2019-04-12 MED ORDER — DEXMEDETOMIDINE HCL IN NACL 200 MCG/50ML IV SOLN
INTRAVENOUS | Status: DC | PRN
  Administered 2019-04-12: 8 ug via INTRAVENOUS
  Administered 2019-04-12: 12 ug via INTRAVENOUS

## 2019-04-12 MED ORDER — ONDANSETRON HCL 4 MG PO TABS: 4.0000 mg | ORAL_TABLET | Freq: Four times a day (QID) | ORAL | Status: DC | PRN

## 2019-04-12 MED ORDER — MIDAZOLAM HCL 2 MG/2ML IJ SOLN
INTRAMUSCULAR | Status: AC
  Filled 2019-04-12: qty 2

## 2019-04-12 MED ORDER — METOCLOPRAMIDE HCL 5 MG/ML IJ SOLN: 5.0000 mg | Freq: Three times a day (TID) | INTRAMUSCULAR | Status: DC | PRN

## 2019-04-12 MED ORDER — LIDOCAINE HCL (CARDIAC) PF 100 MG/5ML IV SOSY
PREFILLED_SYRINGE | INTRAVENOUS | Status: DC | PRN
  Administered 2019-04-12: 50 mg via INTRAVENOUS

## 2019-04-12 MED ORDER — KETAMINE HCL 50 MG/ML IJ SOLN
INTRAMUSCULAR | Status: AC
  Filled 2019-04-12: qty 10

## 2019-04-12 MED ORDER — CLOPIDOGREL BISULFATE 75 MG PO TABS
75.0000 mg | ORAL_TABLET | Freq: Every day | ORAL | Status: DC
  Administered 2019-04-12 – 2019-04-23 (×11): 75 mg via ORAL
  Filled 2019-04-12 (×12): qty 1

## 2019-04-12 MED ORDER — FENTANYL CITRATE (PF) 100 MCG/2ML IJ SOLN
INTRAMUSCULAR | Status: AC
  Filled 2019-04-12: qty 2

## 2019-04-12 MED ORDER — SUCCINYLCHOLINE CHLORIDE 20 MG/ML IJ SOLN
INTRAMUSCULAR | Status: DC | PRN
  Administered 2019-04-12: 100 mg via INTRAVENOUS

## 2019-04-12 MED ORDER — ENOXAPARIN SODIUM 40 MG/0.4ML ~~LOC~~ SOLN
40.0000 mg | SUBCUTANEOUS | Status: DC
  Administered 2019-04-13 – 2019-04-15 (×3): 40 mg via SUBCUTANEOUS
  Filled 2019-04-12 (×4): qty 0.4

## 2019-04-12 MED ORDER — DEXAMETHASONE SODIUM PHOSPHATE 10 MG/ML IJ SOLN
INTRAMUSCULAR | Status: AC
  Filled 2019-04-12: qty 1

## 2019-04-12 MED ORDER — OXYCODONE HCL 5 MG/5ML PO SOLN: 5.0000 mg | Freq: Once | ORAL | Status: DC | PRN

## 2019-04-12 MED ORDER — ONDANSETRON HCL 4 MG/2ML IJ SOLN: 4.0000 mg | Freq: Four times a day (QID) | INTRAMUSCULAR | Status: DC | PRN

## 2019-04-12 MED ORDER — PHENYLEPHRINE HCL (PRESSORS) 10 MG/ML IV SOLN
INTRAVENOUS | Status: DC | PRN
  Administered 2019-04-12 (×2): 100 ug via INTRAVENOUS

## 2019-04-12 MED ORDER — KETAMINE HCL 50 MG/ML IJ SOLN
INTRAMUSCULAR | Status: DC | PRN
  Administered 2019-04-12: 50 mg via INTRAVENOUS

## 2019-04-12 MED ORDER — ONDANSETRON HCL 4 MG/2ML IJ SOLN
INTRAMUSCULAR | Status: DC | PRN
  Administered 2019-04-12: 4 mg via INTRAVENOUS

## 2019-04-12 MED ORDER — GLYCOPYRROLATE 0.2 MG/ML IJ SOLN
INTRAMUSCULAR | Status: AC
  Filled 2019-04-12: qty 1

## 2019-04-12 MED ORDER — PROPOFOL 10 MG/ML IV BOLUS
INTRAVENOUS | Status: DC | PRN
  Administered 2019-04-12: 100 mg via INTRAVENOUS

## 2019-04-12 MED ORDER — PROPOFOL 10 MG/ML IV BOLUS
INTRAVENOUS | Status: AC
  Filled 2019-04-12: qty 20

## 2019-04-12 MED ORDER — NEOMYCIN-POLYMYXIN B GU 40-200000 IR SOLN
Status: DC | PRN
  Administered 2019-04-12: 16 mL

## 2019-04-12 MED ORDER — PROPOFOL 10 MG/ML IV BOLUS
INTRAVENOUS | Status: DC | PRN
  Administered 2019-04-12: 150 mg via INTRAVENOUS

## 2019-04-12 MED ORDER — FENTANYL CITRATE (PF) 100 MCG/2ML IJ SOLN
INTRAMUSCULAR | Status: DC | PRN
  Administered 2019-04-12 (×4): 50 ug via INTRAVENOUS

## 2019-04-12 MED ORDER — OXYCODONE HCL 5 MG PO TABS: 5.0000 mg | ORAL_TABLET | Freq: Once | ORAL | Status: DC | PRN

## 2019-04-12 MED ORDER — MIDAZOLAM HCL 2 MG/2ML IJ SOLN
INTRAMUSCULAR | Status: DC | PRN
  Administered 2019-04-12: 2 mg via INTRAVENOUS

## 2019-04-12 MED ORDER — ONDANSETRON HCL 4 MG/2ML IJ SOLN
INTRAMUSCULAR | Status: AC
  Filled 2019-04-12: qty 2

## 2019-04-12 MED ORDER — METOCLOPRAMIDE HCL 10 MG PO TABS: 5.0000 mg | ORAL_TABLET | Freq: Three times a day (TID) | ORAL | Status: DC | PRN

## 2019-04-12 MED ORDER — FENTANYL CITRATE (PF) 100 MCG/2ML IJ SOLN: 25.0000 ug | INTRAMUSCULAR | Status: DC | PRN

## 2019-04-12 MED ORDER — LIDOCAINE HCL (PF) 2 % IJ SOLN
INTRAMUSCULAR | Status: AC
  Filled 2019-04-12: qty 10

## 2019-04-12 MED ORDER — GLYCOPYRROLATE 0.2 MG/ML IJ SOLN
INTRAMUSCULAR | Status: DC | PRN
  Administered 2019-04-12: 0.2 mg via INTRAVENOUS

## 2019-04-12 MED ORDER — INSULIN DETEMIR 100 UNIT/ML ~~LOC~~ SOLN
45.0000 [IU] | Freq: Every day | SUBCUTANEOUS | Status: DC
  Administered 2019-04-12 – 2019-04-14 (×3): 45 [IU] via SUBCUTANEOUS
  Filled 2019-04-12 (×4): qty 0.45

## 2019-04-12 MED ORDER — DEXAMETHASONE SODIUM PHOSPHATE 10 MG/ML IJ SOLN
INTRAMUSCULAR | Status: DC | PRN
  Administered 2019-04-12: 5 mg via INTRAVENOUS

## 2019-04-12 MED ORDER — FENTANYL CITRATE (PF) 100 MCG/2ML IJ SOLN
INTRAMUSCULAR | Status: DC | PRN
  Administered 2019-04-12 (×2): 50 ug via INTRAVENOUS

## 2019-04-12 MED ORDER — SODIUM CHLORIDE 0.9 % IV SOLN
2.0000 g | INTRAVENOUS | Status: DC
  Administered 2019-04-12 – 2019-04-15 (×4): 2 g via INTRAVENOUS
  Filled 2019-04-12 (×2): qty 2
  Filled 2019-04-12: qty 20
  Filled 2019-04-12: qty 2

## 2019-04-12 SURGICAL SUPPLY — 31 items
BLADE SURG MINI STRL (BLADE) ×3 IMPLANT
BNDG CMPR STD VLCR NS LF 5.8X3 (GAUZE/BANDAGES/DRESSINGS) ×1
BNDG ELASTIC 3X5.8 VLCR NS LF (GAUZE/BANDAGES/DRESSINGS) ×3 IMPLANT
BNDG ESMARK 4X12 TAN STRL LF (GAUZE/BANDAGES/DRESSINGS) ×3 IMPLANT
CANISTER SUCT 1200ML W/VALVE (MISCELLANEOUS) ×3 IMPLANT
CANISTER WOUND CARE 500ML ATS (WOUND CARE) ×3 IMPLANT
CAST PADDING 3X4FT ST 30246 (SOFTGOODS) ×2
CHLORAPREP W/TINT 26 (MISCELLANEOUS) ×6 IMPLANT
COVER WAND RF STERILE (DRAPES) ×3 IMPLANT
CUFF TOURN SGL QUICK 18X4 (TOURNIQUET CUFF) IMPLANT
DRAPE SURG 17X11 SM STRL (DRAPES) ×3 IMPLANT
DRAPE U-SHAPE 47X51 STRL (DRAPES) ×3 IMPLANT
ELECT REM PT RETURN 9FT ADLT (ELECTROSURGICAL) ×3
ELECTRODE REM PT RTRN 9FT ADLT (ELECTROSURGICAL) ×1 IMPLANT
GAUZE SPONGE 4X4 12PLY STRL (GAUZE/BANDAGES/DRESSINGS) ×3 IMPLANT
GAUZE XEROFORM 1X8 LF (GAUZE/BANDAGES/DRESSINGS) ×3 IMPLANT
GLOVE BIOGEL PI IND STRL 9 (GLOVE) ×1 IMPLANT
GLOVE BIOGEL PI INDICATOR 9 (GLOVE) ×2
GLOVE SURG 9.0 ORTHO LTXF (GLOVE) ×3 IMPLANT
GOWN STRL REUS TWL 2XL XL LVL4 (GOWN DISPOSABLE) ×3 IMPLANT
GOWN STRL REUS W/ TWL LRG LVL3 (GOWN DISPOSABLE) ×1 IMPLANT
GOWN STRL REUS W/TWL LRG LVL3 (GOWN DISPOSABLE) ×2
KIT DRSG VAC SLVR GRANUFM (MISCELLANEOUS) ×3 IMPLANT
KIT TURNOVER KIT A (KITS) ×3 IMPLANT
NS IRRIG 500ML POUR BTL (IV SOLUTION) ×3 IMPLANT
PACK EXTREMITY ARMC (MISCELLANEOUS) ×3 IMPLANT
PAD CAST CTTN 3X4 STRL (SOFTGOODS) ×1 IMPLANT
PAD PREP 24X41 OB/GYN DISP (PERSONAL CARE ITEMS) ×3 IMPLANT
STOCKINETTE STRL 4IN 9604848 (GAUZE/BANDAGES/DRESSINGS) ×3 IMPLANT
STRAP SAFETY 5IN WIDE (MISCELLANEOUS) ×3 IMPLANT
SUT ETHILON 4 0 P 3 18 (SUTURE) ×3 IMPLANT

## 2019-04-12 SURGICAL SUPPLY — 42 items
BLADE SURG 15 STRL LF DISP TIS (BLADE) ×1 IMPLANT
BLADE SURG 15 STRL SS (BLADE) ×1
BNDG ESMARK 6X12 TAN STRL LF (GAUZE/BANDAGES/DRESSINGS) ×2 IMPLANT
CANISTER SUCT 1200ML W/VALVE (MISCELLANEOUS) ×2 IMPLANT
COVER WAND RF STERILE (DRAPES) ×2 IMPLANT
CUFF TOURN SGL QUICK 18X4 (TOURNIQUET CUFF) IMPLANT
CUFF TOURN SGL QUICK 24 (TOURNIQUET CUFF) ×1
CUFF TRNQT CYL 24X4X16.5-23 (TOURNIQUET CUFF) ×1 IMPLANT
DRAPE FLUOR MINI C-ARM 54X84 (DRAPES) ×2 IMPLANT
DRAPE INCISE IOBAN 66X45 STRL (DRAPES) ×2 IMPLANT
DRAPE U-SHAPE 47X51 STRL (DRAPES) ×2 IMPLANT
DURAPREP 26ML APPLICATOR (WOUND CARE) ×2 IMPLANT
ELECT REM PT RETURN 9FT ADLT (ELECTROSURGICAL) ×2
ELECTRODE REM PT RTRN 9FT ADLT (ELECTROSURGICAL) ×1 IMPLANT
GAUZE 4X4 16PLY RFD (DISPOSABLE) ×2 IMPLANT
GAUZE SPONGE 4X4 12PLY STRL (GAUZE/BANDAGES/DRESSINGS) ×2 IMPLANT
GAUZE XEROFORM 1X8 LF (GAUZE/BANDAGES/DRESSINGS) ×2 IMPLANT
GLOVE BIOGEL PI IND STRL 9 (GLOVE) ×1 IMPLANT
GLOVE BIOGEL PI INDICATOR 9 (GLOVE) ×1
GLOVE SURG 9.0 ORTHO LTXF (GLOVE) ×2 IMPLANT
GOWN STRL REUS TWL 2XL XL LVL4 (GOWN DISPOSABLE) ×2 IMPLANT
GOWN STRL REUS W/ TWL LRG LVL3 (GOWN DISPOSABLE) ×1 IMPLANT
GOWN STRL REUS W/TWL LRG LVL3 (GOWN DISPOSABLE) ×1
JET LAVAGE IRRISEPT WOUND (IRRIGATION / IRRIGATOR) ×2
KIT TURNOVER KIT A (KITS) ×2 IMPLANT
LABEL OR SOLS (LABEL) ×2 IMPLANT
LAVAGE JET IRRISEPT WOUND (IRRIGATION / IRRIGATOR) ×1 IMPLANT
NS IRRIG 1000ML POUR BTL (IV SOLUTION) ×2 IMPLANT
PACK EXTREMITY ARMC (MISCELLANEOUS) ×2 IMPLANT
PAD ABD DERMACEA PRESS 5X9 (GAUZE/BANDAGES/DRESSINGS) ×2 IMPLANT
PAD CAST CTTN 4X4 STRL (SOFTGOODS) ×1 IMPLANT
PADDING CAST COTTON 4X4 STRL (SOFTGOODS) ×1
SPLINT CAST 1 STEP 4X30 (MISCELLANEOUS) ×2 IMPLANT
SPONGE LAP 18X18 RF (DISPOSABLE) ×2 IMPLANT
STAPLER SKIN PROX 35W (STAPLE) ×2 IMPLANT
STOCKINETTE STRL 6IN 960660 (GAUZE/BANDAGES/DRESSINGS) ×2 IMPLANT
STRIP CLOSURE SKIN 1/2X4 (GAUZE/BANDAGES/DRESSINGS) ×2 IMPLANT
SUT VIC AB 2-0 SH 27 (SUTURE) ×1
SUT VIC AB 2-0 SH 27XBRD (SUTURE) ×1 IMPLANT
SYR 30ML LL (SYRINGE) ×2 IMPLANT
TAPE MICROPORE 2IN (TAPE) ×2 IMPLANT
avance negative pressure wound therapy ×2 IMPLANT

## 2019-04-12 NOTE — Consult Note (Addendum)
Pharmacy Antibiotic Note  Victor Wise is a 68 y.o. male admitted on 04/10/2019 with left ankle osteomyelitis with hardware infection.  Pharmacy has been consulted for Vancomycin dosing. Patient received Vancomycin 1g x1 11/25 at around noon. However, per nurse, patient experience red splotchy areas to right arm after Vancomycin was started and the medication was stopped. Nurse notified pharmacy and attending of reaction. Notably, patient has been on Vancomycin with no prior issues related to that event - will continue to monitor.  Per Dr Leslye Peer, we are continuing therapy.   Patient to OR today for successful irrigation and debridement of open left lateral ankle wound, removal of orthopedic hardware and wound VAC placement.   Plan: Continue Vancomycin 1250 mg IV every 12 hours AUC goal: 400-550 Expected AUC: 534.6 SCr used: 0.80 (rouded up) Cssmin: 15.3 T1/2: 9.1 h BMI 41.1  Patient has missed dosing of Vancomycin and only received load and 1 x 1250mg  dose so far, so will not likely be at steady state until Sunday/Monday if dosing is continued.   Levofloxacin changed to Ceftriaxone 2g q24h to better cover E Coli pathogen in urine - patient has listed cephalosporin allergy to cephalexin of itching - however pt has had multiple doses of Ceftriaxone as inpatient without documented incidence.  Height: 5\' 8"  (172.7 cm) Weight: 270 lb (122.5 kg) IBW/kg (Calculated) : 68.4  Temp (24hrs), Avg:97.9 F (36.6 C), Min:96.7 F (35.9 C), Max:98.6 F (37 C)  Recent Labs  Lab 04/10/19 0917 04/11/19 0551 04/12/19 0618  WBC 6.8 5.1  --   CREATININE 0.87 0.66 0.51*    Estimated Creatinine Clearance: 114.1 mL/min (A) (by C-G formula based on SCr of 0.51 mg/dL (L)).    Allergies  Allergen Reactions  . Amoxicillin Other (See Comments)    Has never taken amoxicillin -ENT told him not to take (? Had skin test) Has patient had a PCN reaction causing immediate rash, facial/tongue/throat swelling,  SOB or lightheadedness with hypotension: No Has patient had a PCN reaction causing severe rash involving mucus membranes or skin necrosis: No Has patient had a PCN reaction that required hospitalization No Has patient had a PCN reaction occurring within the last 10 years: No If above answers are "NO", then may proceed w/ Cephalo  . Other Anaphylaxis, Itching and Other (See Comments)    Pt states that he is allergic to Endopa.  Reaction:  Anaphylaxis  Pt states that he is allergic to Metabisulfites Reaction:  Itching   . Penicillins Other (See Comments)    Arm turned "blue" at injection site (no treatment needed) Has patient had a PCN reaction causing immediate rash, facial/tongue/throat swelling, SOB or lightheadedness with hypotension: No Has patient had a PCN reaction causing severe rash involving mucus membranes or skin necrosis: No Has patient had a PCN reaction that required hospitalization No Has patient had a PCN reaction occurring within the last 10 years: No If all of the above answers are "NO", then may proceed with Cephalosporin use.  [name] Hydralazine Other (See Comments)    Reaction:  Cramping of extremities Pt reports tolerating low dose 12.5mg  but no higher.  . Cephalexin Itching  . Clindamycin Itching  . Crestor [Rosuvastatin Calcium] Other (See Comments)    Reaction:  Pt is unable to move arms/legs   . Metoprolol Nausea And Vomiting  . Red Dye Itching  . Statins Other (See Comments)    Reaction:  Pt is unable to move arms/legs  . Sulfa Antibiotics Itching  Other reaction(s): Unknown  . Yellow Dyes (Non-Tartrazine) Itching  . Aspirin Itching and Other (See Comments)    Pt states that he is able to use in lower doses.    . Tape Rash    Please use "paper" tape only.    Antimicrobials this admission: 11/25 vancomycin >>  11/26 levofloxacin >>11/26 11/27 Ceftriaxone >>  Microbiology results: 11/25 UCx: E coli (ceftriaxone sensitive/cipro resistant) 11/23 SARS  CoV-2: negative   Thank you for allowing pharmacy to be a part of this patient's care.  Lu Duffel, PharmD, BCPS Clinical Pharmacist 04/12/2019 2:10 PM

## 2019-04-12 NOTE — Progress Notes (Signed)
Subjective:  Patient seen in the preoperative area. No clinical change to the left lower extremity overnight. Patient had no questions regarding the procedure. He is awake, alert and comfortable.  Objective:   VITALS:   Vitals:   04/11/19 1801 04/11/19 2302 04/12/19 0756 04/12/19 0803  BP: 128/63 (!) 141/73  123/74  Pulse: 61 60  (!) 59  Resp: 18 16  14   Temp: 98.5 F (36.9 C) 98.6 F (37 C) (!) 97.2 F (36.2 C)   TempSrc: Oral Oral Temporal   SpO2: 97% 99%  97%  Weight:      Height:        PHYSICAL EXAM: Left lower extremity: Bandage remains on the left lower extremity. Patient's foot remains perfused. There is no excessive swelling. Compartments are soft. There is no expanding erythema.   LABS  Results for orders placed or performed during the hospital encounter of 04/10/19 (from the past 24 hour(s))  Glucose, capillary     Status: Abnormal   Collection Time: 04/11/19 12:32 PM  Result Value Ref Range   Glucose-Capillary 201 (H) 70 - 99 mg/dL  Glucose, capillary     Status: Abnormal   Collection Time: 04/11/19  5:20 PM  Result Value Ref Range   Glucose-Capillary 193 (H) 70 - 99 mg/dL  Glucose, capillary     Status: Abnormal   Collection Time: 04/11/19  9:25 PM  Result Value Ref Range   Glucose-Capillary 217 (H) 70 - 99 mg/dL   Comment 1 Notify RN   Basic metabolic panel     Status: Abnormal   Collection Time: 04/12/19  6:18 AM  Result Value Ref Range   Sodium 141 135 - 145 mmol/L   Potassium 3.7 3.5 - 5.1 mmol/L   Chloride 106 98 - 111 mmol/L   CO2 27 22 - 32 mmol/L   Glucose, Bld 195 (H) 70 - 99 mg/dL   BUN 16 8 - 23 mg/dL   Creatinine, Ser 0.51 (L) 0.61 - 1.24 mg/dL   Calcium 9.0 8.9 - 10.3 mg/dL   GFR calc non Af Amer >60 >60 mL/min   GFR calc Af Amer >60 >60 mL/min   Anion gap 8 5 - 15  Glucose, capillary     Status: Abnormal   Collection Time: 04/12/19  7:38 AM  Result Value Ref Range   Glucose-Capillary 169 (H) 70 - 99 mg/dL   Comment 1 Notify RN      Dg Ankle Complete Left  Result Date: 04/10/2019 CLINICAL DATA:  Left ankle pain and swelling. Skin ulceration on the lateral aspect of the ankle. History of prior fracture fixation. EXAM: LEFT ANKLE COMPLETE - 3+ VIEW COMPARISON:  Plain films left ankle 03/23/2019. FINDINGS: Plate and screw fixation of a distal fibular fracture is identified. There is a large skin ulceration over the distal aspect of the plate. Lucency in bone at the level of and interfragmentary screw and distal aspect of the plate is worrisome for osteomyelitis. The patient also has 2 screws in the medial malleolus for fixation of a fracture. No bridging bone is identified and there is lucency about the screws. Cortical destruction is seen in the anterior aspect of the distal tibia. There is an ankle joint effusion. Soft tissues about the ankle are swollen. IMPRESSION: Findings highly suspicious for septic ankle joint with osteomyelitis in both the distal tibia and lateral malleolus as described above. Electronically Signed   By: Inge Rise M.D.   On: 04/10/2019 09:51    Assessment/Plan:  Day of Surgery   Principal Problem:   Infected hardware in left lower extremity (HCC) Active Problems:   Mixed hyperlipidemia   Essential hypertension   OSA on CPAP   Benign essential hypertension   Coronary artery disease due to lipid rich plaque   Type 2 diabetes mellitus with hyperlipidemia (HCC)   Benign prostatic hyperplasia without lower urinary tract symptoms   Obesity, Class III, BMI 40-49.9 (morbid obesity) ([place])   Bimalleolar fracture of left ankle   Wound dehiscence, surgical   Pyogenic inflammation of bone ([place])   Preop examination  Plan for left ankle I&D, hardware removal and placement of VAC dressing. Patient has been n.p.o. after midnight and has been cleared for surgery by the hospitalist service.    Thornton Park , MD 04/12/2019, 8:09 AM

## 2019-04-12 NOTE — Anesthesia Procedure Notes (Signed)
Procedure Name: Intubation Performed by: Buffie Herne, CRNA Pre-anesthesia Checklist: Patient identified, Patient being monitored, Timeout performed, Emergency Drugs available and Suction available Patient Re-evaluated:Patient Re-evaluated prior to induction Oxygen Delivery Method: Circle system utilized Preoxygenation: Pre-oxygenation with 100% oxygen Induction Type: IV induction and Rapid sequence Laryngoscope Size: Miller and 2 Grade View: Grade II Tube type: Oral Tube size: 7.5 mm Number of attempts: 1 Airway Equipment and Method: Stylet Placement Confirmation: ETT inserted through vocal cords under direct vision,  positive ETCO2 and breath sounds checked- equal and bilateral Secured at: 23 cm Tube secured with: Tape Dental Injury: Teeth and Oropharynx as per pre-operative assessment        

## 2019-04-12 NOTE — Anesthesia Postprocedure Evaluation (Signed)
Anesthesia Post Note  Patient: Victor Wise  Procedure(s) Performed: HARDWARE REMOVAL (Left Ankle) IRRIGATION AND DEBRIDEMENT WOUND (Left Ankle) APPLICATION OF WOUND VAC (Left Ankle)  Patient location during evaluation: PACU Anesthesia Type: General Level of consciousness: awake and alert Pain management: pain level controlled Vital Signs Assessment: post-procedure vital signs reviewed and stable Respiratory status: spontaneous breathing, nonlabored ventilation, respiratory function stable and patient connected to nasal cannula oxygen Cardiovascular status: blood pressure returned to baseline and stable Postop Assessment: no apparent nausea or vomiting Anesthetic complications: no     Last Vitals:  Vitals:   04/12/19 1030 04/12/19 1042  BP: 124/76 117/71  Pulse: 94 60  Resp: 14 15  Temp:    SpO2: 96% 95%    Last Pain:  Vitals:   04/12/19 1042  TempSrc:   PainSc: 0-No pain                 Precious Haws Piscitello

## 2019-04-12 NOTE — Anesthesia Post-op Follow-up Note (Signed)
Anesthesia QCDR form completed.        

## 2019-04-12 NOTE — Op Note (Signed)
04/12/2019  1:03 PM  PATIENT:  Victor Wise    PRE-OPERATIVE DIAGNOSIS:  OBSTRUCTED LEFT ANKLE WOUND VAC  POST-OPERATIVE DIAGNOSIS:  Same  PROCEDURE:  WOUND VAC EXCHANGE, LEFT ANKLE  SURGEON:  Thornton Park, MD  ANESTHESIA:   General  PREOPERATIVE INDICATIONS:  Victor Wise is a  68 y.o. male in the PACU whose negative pressure dressing was malfunctioning.  There was an area matches stating that the wound VAC was obstructed.  The decision was made to return to the OR for exchange of his VAC dressing.  I spoke with the patient's daughter prior to the secondary procedure to let her know the plan.  She was in agreement with the plan.  OPERATIVE IMPLANTS: KCI negative pressure (VAC) dressing  OPERATIVE FINDINGS: Obstructed Avance Flex negative pressure dressing  OPERATIVE PROCEDURE: Patient was brought back to the operating room.  He underwent general endotracheal intubation by the anesthesia service.  Patient's dressing was removed.  He was prepped and draped in a sterile fashion.  His lateral wound was irrigated again with GU infused saline.  A new vacuum dressing was applied to the lateral wound.  The VAC was checked and found to be functioning.  A new posterior splint was applied to the left lower extremity.  The patient was awoken and brought to the PACU in stable condition.

## 2019-04-12 NOTE — Anesthesia Postprocedure Evaluation (Signed)
Anesthesia Post Note  Patient: Victor Wise  Procedure(s) Performed: IRRIGATION AND DEBRIDEMENT WOUND, WOUND VAC EXCHANGE (Left Ankle)  Patient location during evaluation: PACU Anesthesia Type: General Level of consciousness: awake and alert Pain management: pain level controlled Vital Signs Assessment: post-procedure vital signs reviewed and stable Respiratory status: spontaneous breathing, nonlabored ventilation, respiratory function stable and patient connected to nasal cannula oxygen Cardiovascular status: blood pressure returned to baseline and stable Postop Assessment: no apparent nausea or vomiting Anesthetic complications: no     Last Vitals:  Vitals:   04/12/19 1305 04/12/19 1321  BP: (!) 148/74 133/73  Pulse: (!) 59 60  Resp: 15 (!) 6  Temp:    SpO2: 100% 100%    Last Pain:  Vitals:   04/12/19 1321  TempSrc:   PainSc: Asleep                 Precious Haws Piscitello

## 2019-04-12 NOTE — Transfer of Care (Signed)
Immediate Anesthesia Transfer of Care Note  Patient: Victor Wise  Procedure(s) Performed: HARDWARE REMOVAL (Left Ankle) IRRIGATION AND DEBRIDEMENT WOUND (Left Ankle) APPLICATION OF WOUND VAC (Left Ankle)  Patient Location: PACU  Anesthesia Type:General  Level of Consciousness: sedated  Airway & Oxygen Therapy: Patient Spontanous Breathing and Patient connected to face mask oxygen  Post-op Assessment: Report given to RN and Post -op Vital signs reviewed and stable  Post vital signs: Reviewed  Last Vitals:  Vitals Value Taken Time  BP 128/65 04/12/19 1012  Temp    Pulse 60 04/12/19 1012  Resp 14 04/12/19 1012  SpO2 96 % 04/12/19 1012  Vitals shown include unvalidated device data.  Last Pain:  Vitals:   04/12/19 0803  TempSrc:   PainSc: Asleep      Patients Stated Pain Goal: 7 ([id number])  Complications: No apparent anesthesia complications

## 2019-04-12 NOTE — Anesthesia Preprocedure Evaluation (Addendum)
Anesthesia Evaluation  Patient identified by MRN, date of birth, ID band Patient awake    Reviewed: Allergy & Precautions, NPO status , Patient's Chart, lab work & pertinent test results  History of Anesthesia Complications Negative for: history of anesthetic complications  Airway Mallampati: III  TM Distance: >3 FB Neck ROM: Full    Dental  (+) Poor Dentition, Missing, Chipped   Pulmonary neg shortness of breath, sleep apnea and Continuous Positive Airway Pressure Ventilation , neg COPD, neg recent URI, former smoker,    breath sounds clear to auscultation- rhonchi (-) wheezing      Cardiovascular hypertension, (-) angina+ CAD, + Past MI, + Cardiac Stents (last stent ~3 yrs ago), + Peripheral Vascular Disease and +CHF  (-) CABG (-) dysrhythmias + pacemaker (-) Valvular Problems/Murmurs Rhythm:Regular Rate:Normal - Systolic murmurs and - Diastolic murmurs    Neuro/Psych neg Seizures PSYCHIATRIC DISORDERS Depression  Neuromuscular disease CVA, No Residual Symptoms    GI/Hepatic Neg liver ROS, GERD  ,  Endo/Other  diabetes, Insulin Dependent  Renal/GU Renal disease     Musculoskeletal negative musculoskeletal ROS (+)   Abdominal (+) + obese,   Peds  Hematology negative hematology ROS (+)   Anesthesia Other Findings Wound sight bleeding  Patient remains NPO appropriate from last procedure   Past Medical History: No date: Allergy No date: Basal cell carcinoma     Comment:  forehead No date: BPH (benign prostatic hyperplasia) No date: Chronic back pain No date: Depression No date: Diabetes (HCC) No date: GERD (gastroesophageal reflux disease) No date: Heart attack ([place]) No date: Hypertension No date: Morbid obesity ([place]) No date: Obstructive sleep apnea No date: Osteomyelitis of foot (HCC) No date: Status post insertion of spinal cord stimulator No date: Stroke [facility]) No date: UTI (lower urinary tract  infection)    Reproductive/Obstetrics                             Anesthesia Physical  Anesthesia Plan  ASA: IV and emergent  Anesthesia Plan: General ETT   Post-op Pain Management:    Induction: Intravenous  PONV Risk Score and Plan: 2 and Ondansetron, Dexamethasone and Treatment may vary due to age or medical condition  Airway Management Planned: Oral ETT  Additional Equipment:   Intra-op Plan:   Post-operative Plan: Extubation in OR  Informed Consent: I have reviewed the patients History and Physical, chart, labs and discussed the procedure including the risks, benefits and alternatives for the proposed anesthesia with the patient or authorized representative who has indicated his/her understanding and acceptance.     Dental advisory given  Plan Discussed with: CRNA and Anesthesiologist  Anesthesia Plan Comments: (Patient had bleeding in PACU that required him being emergently brought back to the OR.  Patient consented for risks of anesthesia including but not limited to:  - adverse reactions to medications - damage to teeth, lips or other oral mucosa - sore throat or hoarseness - Damage to heart, brain, lungs or loss of life  Patient voiced understanding.)      Anesthesia Quick Evaluation

## 2019-04-12 NOTE — OR Nursing (Signed)
Explanted 1 plate and 11 screws from left ankle, lateral side

## 2019-04-12 NOTE — Op Note (Signed)
04/12/2019  12:50 PM  PATIENT:  Victor Wise    PRE-OPERATIVE DIAGNOSIS: Left ankle wound dehiscence with possible infected hardware  POST-OPERATIVE DIAGNOSIS:  Same  PROCEDURE: Irrigation and debridement of open left lateral ankle wound, removal of orthopedic hardware and wound VAC placement.  SURGEON:  Thornton Park, MD  ANESTHESIA:   General  PREOPERATIVE INDICATIONS:  Victor Wise is a  68 y.o. male with a wound dehiscence of a left lateral ankle incision now approximately 10 weeks postop from ORIF of a bimalleolar ankle fracture.  I discussed the risks and benefits of surgery. The risks include but are not limited to infection, bleeding, nerve or blood vessel injury, joint stiffness or loss of motion, persistent pain, weakness or instability, fracture, malunion, nonunion and hardware failure and the need for further surgery. Medical risks include but are not limited to DVT and pulmonary embolism, myocardial infarction, stroke, pneumonia, respiratory failure and death. Patient understood these risks and wished to proceed.   OPERATIVE IMPLANTS: Avance Flex Negative Pressure Wound Therapy System  OPERATIVE FINDINGS: Exposed hardware through the lateral ankle wound assistance with possible distal fibular osteomyelitis  OPERATIVE PROCEDURE: Patient was met in the preoperative area.  His left leg was marked with my initials and the word yes according the hospital's correct site of surgery protocol.  Patient was brought to the operating room.  He was prepped and draped in sterile fashion.  A timeout was performed to verify the patient's name, date of birth, medical record number, correct site of surgery and correct procedure to be performed.  The timeout was also used to confirm the patient received antibiotics and all appropriate instruments were available in the room.  Once all in attendance were in agreement the case began.  Patient received Rocephin 2 g IV prior to the onset of the  case.  Patient had cultures taken of cloudy fluid from the midportion of the wound.  The left ankle joint was also aspirated but only a small amount of bloody fluid was obtained.  This is sent on a culture swab for Gram stain and culture.  The lateral plate was then removed after backing out all screws including the lag screw.  Patient was found to have cloudy fluid draining from to the distal holes created by removal of the plate.  This fluid was also collected via a culture swab for Gram stain and culture.  The wound was debrided extensively of all nonviable tissue.  The wound edges were freshened with a #15 blade removing all scarred skin.  The wound was then copiously irrigated.  A small arthrotomy was made anterior to the distal fibula exposing the ankle joint.  This was also copiously irrigated with GU infused saline.  The attention was then turned to the medial ankle.  An incision small incision was made through the original scar.  FluoroScan imaging was used to identify the position of the 2 x 4.0 cannulated screws.  These were removed by hand with a screwdriver.  Another small arthrotomy was made medially for further irrigation of the ankle joint with GU infused saline.  The medial incision was closed with 4-0 nylon sutures.  The attention was then turned back to the lateral wound which again was irrigated.  A VAC dressing was placed with an advance flex negative pressure wound therapy system.  The VAC was functioning well.  Dry sterile dressings were placed over the medial incision.  The left lower extremity was wrapped in webril leaving a window  for viewing the VAC sponge.  The posterior splint was applied.  The patient was awoken and brought to the PACU in stable condition.

## 2019-04-12 NOTE — OR Nursing (Signed)
Pt wound vac not working properly Dr. Mack Guise notified. Alarm states there is a blockage. Wound vac company notified by MD. Dressing has been discontinued by Dr Mack Guise and a new one will be applied in the OR. Dr Amie Critchley in to see pastient.

## 2019-04-12 NOTE — OR Nursing (Signed)
Explanted 2 screws from medial side of left ankle

## 2019-04-12 NOTE — Transfer of Care (Signed)
Immediate Anesthesia Transfer of Care Note  Patient: Victor Wise  Procedure(s) Performed: IRRIGATION AND DEBRIDEMENT WOUND, WOUND VAC EXCHANGE (Left Ankle)  Patient Location: PACU  Anesthesia Type:General  Level of Consciousness: sedated  Airway & Oxygen Therapy: Patient Spontanous Breathing and Patient connected to face mask oxygen  Post-op Assessment: Report given to RN and Post -op Vital signs reviewed and stable  Post vital signs: Reviewed  Last Vitals:  Vitals Value Taken Time  BP 128/69 04/12/19 1250  Temp    Pulse 65 04/12/19 1250  Resp 15 04/12/19 1250  SpO2 100 % 04/12/19 1250    Last Pain:  Vitals:   04/12/19 1127  TempSrc:   PainSc: Asleep      Patients Stated Pain Goal: 7 ([id number])  Complications: No apparent anesthesia complications

## 2019-04-12 NOTE — Anesthesia Procedure Notes (Signed)
Procedure Name: Intubation Performed by: Rolla Plate, CRNA Pre-anesthesia Checklist: Patient identified, Patient being monitored, Timeout performed, Emergency Drugs available and Suction available Patient Re-evaluated:Patient Re-evaluated prior to induction Oxygen Delivery Method: Circle system utilized Preoxygenation: Pre-oxygenation with 100% oxygen Induction Type: IV induction and Rapid sequence Laryngoscope Size: Miller and 2 Grade View: Grade I Tube type: Oral Tube size: 7.5 mm Number of attempts: 1 Airway Equipment and Method: Stylet Placement Confirmation: ETT inserted through vocal cords under direct vision,  positive ETCO2 and breath sounds checked- equal and bilateral Secured at: 23 cm Tube secured with: Tape Dental Injury: Teeth and Oropharynx as per pre-operative assessment

## 2019-04-12 NOTE — Progress Notes (Signed)
Patient ID: Victor Wise, male   DOB: 1951/05/16, 68 y.o.   MRN: [id number] [facility] PROGRESS NOTE  Victor Wise [id number] DOB: 1951-02-24 DOA: 04/10/2019 PCP: Lavera Guise, MD  HPI/Subjective: Patient feeling better with regards to his pain control.  Had settled down where he is able to get some sleep last night.  No complaints of chest pain or shortness of breath.  Objective: Vitals:   04/11/19 1801 04/11/19 2302  BP: 128/63 (!) 141/73  Pulse: 61 60  Resp: 18 16  Temp: 98.5 F (36.9 C) 98.6 F (37 C)  SpO2: 97% 99%    Intake/Output Summary (Last 24 hours) at 04/12/2019 0743 Last data filed at 04/11/2019 1612 Gross per 24 hour  Intake 340 ml  Output 1325 ml  Net -985 ml   Filed Weights   04/10/19 0840  Weight: 122.5 kg    ROS: Review of Systems  Constitutional: Negative for chills and fever.  Eyes: Negative for blurred vision.  Respiratory: Negative for cough and shortness of breath.   Cardiovascular: Negative for chest pain.  Gastrointestinal: Negative for abdominal pain, constipation, diarrhea, nausea and vomiting.  Genitourinary: Negative for dysuria.  Musculoskeletal: Positive for joint pain.  Neurological: Negative for dizziness and headaches.   Exam: Physical Exam  HENT:  Nose: No mucosal edema.  Mouth/Throat: No oropharyngeal exudate or posterior oropharyngeal edema.  Eyes: Pupils are equal, round, and reactive to light. Conjunctivae, EOM and lids are normal.  Neck: No JVD present. Carotid bruit is not present. No edema present. No thyroid mass and no thyromegaly present.  Cardiovascular: S1 normal and S2 normal. Exam reveals no gallop.  No murmur heard. Pulses:      Dorsalis pedis pulses are 2+ on the left side.  Respiratory: No respiratory distress. He has no wheezes. He has no rhonchi. He has no rales.  GI: Soft. Bowel sounds are normal. There is no abdominal tenderness.  Musculoskeletal:     Right ankle: He exhibits no swelling.      Left ankle: He exhibits no swelling.  Lymphadenopathy:    He has no cervical adenopathy.  Neurological: He is alert. No cranial nerve deficit.  Skin: Skin is warm. Nails show no clubbing.  Left ankle wrapped.  Psychiatric: He has a normal mood and affect.      Data Reviewed: Basic Metabolic Panel: Recent Labs  Lab 04/10/19 0917 04/11/19 0551 04/12/19 0618  NA 139 140 141  K 3.9 3.7 3.7  CL 101 106 106  CO2 26 27 27   GLUCOSE 177* 165* 195*  BUN 18 14 16   CREATININE 0.87 0.66 0.51*  CALCIUM 9.1 8.7* 9.0   Liver Function Tests: Recent Labs  Lab 04/10/19 0917  AST 17  ALT 14  ALKPHOS 64  BILITOT 0.5  PROT 7.6  ALBUMIN 3.4*   CBC: Recent Labs  Lab 04/10/19 0917 04/11/19 0551  WBC 6.8 5.1  NEUTROABS 4.1  --   HGB 10.6* 9.3*  HCT 33.3* 29.7*  MCV 76.7* 78.8*  PLT 393 301     Recent Results (from the past 240 hour(s))  SARS CORONAVIRUS 2 (TAT 6-24 HRS) Nasopharyngeal Nasopharyngeal Swab     Status: None   Collection Time: 04/08/19  1:20 PM   Specimen: Nasopharyngeal Swab  Result Value Ref Range Status   SARS Coronavirus 2 NEGATIVE NEGATIVE Final    Comment: (NOTE) SARS-CoV-2 target nucleic acids are NOT DETECTED. The SARS-CoV-2 RNA is generally detectable in upper and lower respiratory specimens during  the acute phase of infection. Negative results do not preclude SARS-CoV-2 infection, do not rule out co-infections with other pathogens, and should not be used as the sole basis for treatment or other patient management decisions. Negative results must be combined with clinical observations, patient history, and epidemiological information. The expected result is Negative. Fact Sheet for Patients: SugarRoll.be Fact Sheet for Healthcare Providers: https://www.woods-mathews.com/ This test is not yet approved or cleared by the [place] FDA and  has been authorized for detection and/or diagnosis of SARS-CoV-2  by FDA under an Emergency Use Authorization (EUA). This EUA will remain  in effect (meaning this test can be used) for the duration of the COVID-19 declaration under Section 56 4(b)(1) of the Act, 21 U.S.C. section 360bbb-3(b)(1), unless the authorization is terminated or revoked sooner. Performed at [facility], [place] [address], [place], [place] [address]   Urine culture     Status: Abnormal   Collection Time: 04/10/19 11:28 AM   Specimen: Urine, Random  Result Value Ref Range Status   Specimen Description   Final    URINE, RANDOM Performed at [facility], [address] [place], [place] [address]    Special Requests   Final    NONE Performed at [facility], [address] [place], [place] [address]    Culture >=100,000 COLONIES/mL ESCHERICHIA COLI (A)  Final   Report Status 04/12/2019 FINAL  Final   Organism ID, Bacteria ESCHERICHIA COLI (A)  Final      Susceptibility   Escherichia coli - MIC*    AMPICILLIN 16 INTERMEDIATE Intermediate     CEFAZOLIN <=4 SENSITIVE Sensitive     CEFTRIAXONE <=1 SENSITIVE Sensitive     CIPROFLOXACIN >=4 RESISTANT Resistant     GENTAMICIN <=1 SENSITIVE Sensitive     IMIPENEM <=0.25 SENSITIVE Sensitive     NITROFURANTOIN 32 SENSITIVE Sensitive     TRIMETH/SULFA <=20 SENSITIVE Sensitive     AMPICILLIN/SULBACTAM 4 SENSITIVE Sensitive     PIP/TAZO 8 SENSITIVE Sensitive     Extended ESBL NEGATIVE Sensitive     * >=100,000 COLONIES/mL ESCHERICHIA COLI     Studies: Dg Ankle Complete Left  Result Date: 04/10/2019 CLINICAL DATA:  Left ankle pain and swelling. Skin ulceration on the lateral aspect of the ankle. History of prior fracture fixation. EXAM: LEFT ANKLE COMPLETE - 3+ VIEW COMPARISON:  Plain films left ankle 03/23/2019. FINDINGS: Plate and screw fixation of a distal fibular fracture is identified. There is a large skin ulceration over the distal aspect of the plate. Lucency in bone at the level of and  interfragmentary screw and distal aspect of the plate is worrisome for osteomyelitis. The patient also has 2 screws in the medial malleolus for fixation of a fracture. No bridging bone is identified and there is lucency about the screws. Cortical destruction is seen in the anterior aspect of the distal tibia. There is an ankle joint effusion. Soft tissues about the ankle are swollen. IMPRESSION: Findings highly suspicious for septic ankle joint with osteomyelitis in both the distal tibia and lateral malleolus as described above. Electronically Signed   By: Inge Rise M.D.   On: 04/10/2019 09:51    Scheduled Meds: . amLODipine  10 mg Oral Daily  . aspirin EC  81 mg Oral Daily  . benazepril  20 mg Oral BID  . DULoxetine  30 mg Oral QPM  . DULoxetine  60 mg Oral Daily  . ezetimibe  10 mg Oral Daily  .  feeding supplement (PRO-STAT SUGAR FREE 64)  30 mL Oral Daily  . fenofibrate  54 mg Oral q1800  . furosemide  20 mg Oral Daily  . hydrochlorothiazide  12.5 mg Oral Daily  . insulin aspart  0-20 Units Subcutaneous TID WC  . insulin detemir  45 Units Subcutaneous QHS  . lactulose  20 g Oral See admin instructions  . linaclotide  290 mcg Oral QAC breakfast  . multivitamin with minerals  1 tablet Oral Daily  . omega-3 acid ethyl esters  1 g Oral BID  . oxcarbazepine  600 mg Oral BID  . oxyCODONE  20 mg Oral Q12H  . pantoprazole  40 mg Oral Daily  . polyethylene glycol  17 g Oral BID  . pregabalin  400 mg Oral BID  . senna  1 tablet Oral BID  . tamsulosin  0.4 mg Oral q1800  . vitamin C  500 mg Oral BID   Continuous Infusions: . sodium chloride Stopped (04/11/19 1612)  . levofloxacin (LEVAQUIN) IV Stopped (04/11/19 1420)  . vancomycin 1,250 mg (04/11/19 1612)    Assessment/Plan:  1. Preop evaluation for left ankle osteomyelitis with hardware infection.  Patient had wound dehiscence and exposed hardware.  No contraindications to surgery at this time.  Patient started initially on  vancomycin.  I added Levaquin yesterday for gram-negative coverage.  Will speak with pharmacist about adjusting antibiotics. 2. Type 2 diabetes mellitus with hyperglycemia and hyperlipidemia and prior toe amputations.  Sugar this morning 169.  Received half dose of Levemir last night. 3. Essential hypertension on amlodipine, benazepril, hydrochlorothiazide and low-dose Lasix. 4. Chronic systolic congestive heart failure with EF of 45%.  No signs of heart failure at this point. 5. UTI.  Urine culture on 11/25 showing E. coli resistant to Cipro.  Spoke with pharmacist and she will look into antibiotics that would cover this.  The patient has numerous drug allergies so we will have to look into history of what he received before in the past.  Code Status:     Code Status Orders  (From admission, onward)         Start     Ordered   04/10/19 1050  Full code  Continuous     04/10/19 1058        Code Status History    Date Active Date Inactive Code Status Order ID Comments User Context   01/31/2019 1801 02/07/2019 2006 Full Code [id number]  Thornton Park, MD Inpatient   04/18/2018 1725 04/23/2018 2207 Full Code [id number]  Henreitta Leber, MD Inpatient   03/01/2018 2323 03/13/2018 2210 Full Code [id number]  Lance Coon, MD Inpatient   02/18/2016 1628 02/21/2016 1556 Full Code [id number]  Theodoro Grist, MD ED   10/26/2015 2111 11/03/2015 1616 Full Code [id number]  Vaughan Basta, MD Inpatient   10/19/2015 2200 10/21/2015 2351 Full Code [id number]  Gladstone Lighter, MD Inpatient   12/19/2014 2131 12/21/2014 1710 Full Code [id number]  Charolette Forward, MD Inpatient   12/19/2014 2123 12/19/2014 2131 Full Code [id number]  Charolette Forward, MD Inpatient   12/19/2014 1238 12/19/2014 1718 Full Code [id number]  Dionisio Amarian, MD Inpatient   12/18/2014 1836 12/19/2014 1238 Full Code [id number]  Henreitta Leber, MD Inpatient   Advance Care Planning Activity    Advance Directive Documentation     Most Recent Value  Type  of Advance Directive  Healthcare Power of Attorney, Living will  Pre-existing out of facility DNR order (yellow form or pink MOST form)  -  "  MOST" Form in Place?  -     Family Communication: As per surgery Disposition Plan: Likely will need to go back to rehab  Consultants:  Orthopedic surgery  Antibiotics:  Vancomycin  Levaquin for now.  Time spent: 25 minutes  [place]

## 2019-04-12 NOTE — Anesthesia Preprocedure Evaluation (Signed)
Anesthesia Evaluation  Patient identified by MRN, date of birth, ID band Patient awake    Reviewed: Allergy & Precautions, NPO status , Patient's Chart, lab work & pertinent test results  History of Anesthesia Complications Negative for: history of anesthetic complications  Airway Mallampati: III  TM Distance: >3 FB Neck ROM: Full    Dental  (+) Poor Dentition, Missing, Chipped   Pulmonary neg shortness of breath, sleep apnea and Continuous Positive Airway Pressure Ventilation , neg COPD, neg recent URI, former smoker,    breath sounds clear to auscultation- rhonchi (-) wheezing      Cardiovascular hypertension, (-) angina+ CAD, + Past MI, + Cardiac Stents (last stent ~3 yrs ago), + Peripheral Vascular Disease and +CHF  (-) CABG (-) dysrhythmias + pacemaker (-) Valvular Problems/Murmurs Rhythm:Regular Rate:Normal - Systolic murmurs and - Diastolic murmurs    Neuro/Psych neg Seizures PSYCHIATRIC DISORDERS Depression  Neuromuscular disease CVA, No Residual Symptoms    GI/Hepatic Neg liver ROS, GERD  ,  Endo/Other  diabetes, Insulin Dependent  Renal/GU Renal disease     Musculoskeletal negative musculoskeletal ROS (+)   Abdominal (+) + obese,   Peds  Hematology negative hematology ROS (+)   Anesthesia Other Findings Past Medical History: No date: Allergy No date: Basal cell carcinoma     Comment:  forehead No date: BPH (benign prostatic hyperplasia) No date: Chronic back pain No date: Depression No date: Diabetes (HCC) No date: GERD (gastroesophageal reflux disease) No date: Heart attack ([place]) No date: Hypertension No date: Morbid obesity ([place]) No date: Obstructive sleep apnea No date: Osteomyelitis of foot (HCC) No date: Status post insertion of spinal cord stimulator No date: Stroke [facility]) No date: UTI (lower urinary tract infection)  We had his pacemaker interrogated prior to starting the case and the  patient is not pacemaker dependent, but we will place a magnet over it during the procedure to put it into asynchronous mode with a rate of 85.  Last ECHO showed an EF of 45%.  Reproductive/Obstetrics                             Anesthesia Physical  Anesthesia Plan  ASA: III  Anesthesia Plan: General ETT   Post-op Pain Management:    Induction: Intravenous  PONV Risk Score and Plan: 2 and Ondansetron, Dexamethasone and Treatment may vary due to age or medical condition  Airway Management Planned: Oral ETT  Additional Equipment:   Intra-op Plan:   Post-operative Plan: Extubation in OR  Informed Consent: I have reviewed the patients History and Physical, chart, labs and discussed the procedure including the risks, benefits and alternatives for the proposed anesthesia with the patient or authorized representative who has indicated his/her understanding and acceptance.     Dental advisory given  Plan Discussed with: CRNA and Anesthesiologist  Anesthesia Plan Comments: (Patient consented for risks of anesthesia including but not limited to:  - adverse reactions to medications - damage to teeth, lips or other oral mucosa - sore throat or hoarseness - Damage to heart, brain, lungs or loss of life  Patient voiced understanding.)        Anesthesia Quick Evaluation

## 2019-04-12 NOTE — Consult Note (Signed)
NAME: Victor Wise  DOB: 04-06-1951  MRN: [id number]  Date/Time: 04/12/2019 2:22 PM  REQUESTING PROVIDER: wieting Subjective:  REASON FOR CONSULT: left ankle hardware infection ?Chart reviewed- patient a limited historian Victor Wise is a 68 y.o. with a history of HTN, DM, left ankle fracture s/p ORIF in Sept 2020  Is Admitted for removal of hardware   Pt had displaced bimalleolar fracture of left ankle on 01/03/2019.  Initially in the emergency department it was deemed as nondisplaced.  However upon follow-up the patient had displacement of the lateral malleolus with lateral translation of the talus.  Given the fracture displacement it was recommended the patient undergo surgical fixation .He underwent ORIF on 01/31/19 by Dr.Krasinski. He was discharged to SNF on 02/07/19 He presented to the ED in Nov with pain left ankle after twisting it. He was seen by his ortho who noted wound dehiscence on 03/25/19. He was referred to platic surgeon for [place]. But he did not make it. On 04/08/19 he presented with worsening pain and was then admitted fro removal of hardware Today he underwent Irrigation and debridement of open left lateral ankle wound, removal of orthopedic hardware and wound VAC placement. Multiple cultures have been sent- he has normal WBC, normal cr, he has HB 9.3, Afebrile He is on vanco and ceftriavone  I am asked to see the patient for antibiotic recommendation Pt wa shaving pain left ankle/foot  pt denies any fever, chills, cough, sob, nausea, vomiting, diarrhea  Medical history includes the following Hypertension Diabetes mellitus Diastolic dysfunction Hyperlipidemia CAD/MI PCI and stent placement of LAD and right coronary artery in 2016. Pacemaker Rotator cuff tear Sleep apnea Stroke Renal stones Osteoarthritis of knees Chronic back pain BCC forehead Osteomyelitis foot    Past surgical history Spinal cord stimulator implant Sinus polyp removal Left knee surgery  Cholecystectomy Tonsillectomy Rotator cuff tear repair Glenohumeral synovectomy Left shoulder arthroscopy Right shoulder subacromial decompression PCI with cardiac stent Right great toe amputation Left great toe amputation -Dec 2019    Social history Former smoker No alcohol  Family History  Problem Relation Age of Onset  . Breast cancer Mother   . Cancer Mother   . Hypertension Mother   . Lung cancer Father   . Hypertension Father   . Heart disease Father   . Cancer Father   . Kidney disease Sister   . Prostate cancer Neg Hx    Allergies  Allergen Reactions  . Amoxicillin Other (See Comments)    Has never taken amoxicillin -ENT told him not to take (? Had skin test) Has patient had a PCN reaction causing immediate rash, facial/tongue/throat swelling, SOB or lightheadedness with hypotension: No Has patient had a PCN reaction causing severe rash involving mucus membranes or skin necrosis: No Has patient had a PCN reaction that required hospitalization No Has patient had a PCN reaction occurring within the last 10 years: No If above answers are "NO", then may proceed w/ Cephalo  . Other Anaphylaxis, Itching and Other (See Comments)    Pt states that he is allergic to Endopa.  Reaction:  Anaphylaxis  Pt states that he is allergic to Metabisulfites Reaction:  Itching   . Penicillins Other (See Comments)    Arm turned "blue" at injection site (no treatment needed) Has patient had a PCN reaction causing immediate rash, facial/tongue/throat swelling, SOB or lightheadedness with hypotension: No Has patient had a PCN reaction causing severe rash involving mucus membranes or skin necrosis: No Has patient had  a PCN reaction that required hospitalization No Has patient had a PCN reaction occurring within the last 10 years: No If all of the above answers are "NO", then may proceed with Cephalosporin use.  [name] Hydralazine Other (See Comments)    Reaction:  Cramping of extremities Pt  reports tolerating low dose 12.5mg  but no higher.  . Cephalexin Itching  . Clindamycin Itching  . Crestor [Rosuvastatin Calcium] Other (See Comments)    Reaction:  Pt is unable to move arms/legs   . Metoprolol Nausea And Vomiting  . Red Dye Itching  . Statins Other (See Comments)    Reaction:  Pt is unable to move arms/legs  . Sulfa Antibiotics Itching    Other reaction(s): Unknown  . Yellow Dyes (Non-Tartrazine) Itching  . Aspirin Itching and Other (See Comments)    Pt states that he is able to use in lower doses.    . Tape Rash    Please use "paper" tape only.   ? Current Facility-Administered Medications  Medication Dose Route Frequency Provider Last Rate Last Dose  . 0.9 %  sodium chloride infusion   Intravenous Continuous Thornton Park, MD 40 mL/hr at 04/12/19 1328    . amLODipine (NORVASC) tablet 10 mg  10 mg Oral Daily Thornton Park, MD   10 mg at 04/11/19 1047  . aspirin EC tablet 81 mg  81 mg Oral Daily Thornton Park, MD   81 mg at 04/11/19 1047  . benazepril (LOTENSIN) tablet 20 mg  20 mg Oral BID Thornton Park, MD   20 mg at 04/11/19 2128  . cefTRIAXone (ROCEPHIN) 2 g in sodium chloride 0.9 % 100 mL IVPB  2 g Intravenous Q24H Thornton Park, MD   2 g at 04/12/19 0848  . clopidogrel (PLAVIX) tablet 75 mg  75 mg Oral Daily Thornton Park, MD      . DULoxetine (CYMBALTA) DR capsule 30 mg  30 mg Oral QPM Thornton Park, MD   30 mg at 04/11/19 1756  . DULoxetine (CYMBALTA) DR capsule 60 mg  60 mg Oral Daily Thornton Park, MD   60 mg at 04/11/19 1049  . [START ON 04/13/2019] enoxaparin (LOVENOX) injection 40 mg  40 mg Subcutaneous Q24H Thornton Park, MD      . ezetimibe (ZETIA) tablet 10 mg  10 mg Oral Daily Thornton Park, MD   10 mg at 04/11/19 1047  . feeding supplement (PRO-STAT SUGAR FREE 64) liquid 30 mL  30 mL Oral Daily Thornton Park, MD   30 mL at 04/11/19 1255  . fenofibrate tablet 54 mg  54 mg Oral q1800 Thornton Park, MD   54 mg at  04/11/19 1756  . furosemide (LASIX) tablet 20 mg  20 mg Oral Daily Thornton Park, MD   20 mg at 04/11/19 1047  . hydrochlorothiazide (HYDRODIURIL) tablet 12.5 mg  12.5 mg Oral Daily Thornton Park, MD   12.5 mg at 04/11/19 1045  . insulin aspart (novoLOG) injection 0-20 Units  0-20 Units Subcutaneous TID WC Thornton Park, MD   4 Units at 04/11/19 1755  . insulin detemir (LEVEMIR) injection 45 Units  45 Units Subcutaneous QHS Thornton Park, MD      . lactulose (CHRONULAC) 10 GM/15ML solution 20 g  20 g Oral See admin instructions Thornton Park, MD      . lidocaine (LIDODERM) 5 % 1 patch  1 patch Transdermal Daily PRN Thornton Park, MD      . linaclotide [facility]) capsule 290 mcg  290 mcg Oral  QAC breakfast Thornton Park, MD   290 mcg at 04/11/19 1049  . metoCLOPramide (REGLAN) tablet 5-10 mg  5-10 mg Oral Q8H PRN Thornton Park, MD       Or  . metoCLOPramide (REGLAN) injection 5-10 mg  5-10 mg Intravenous Q8H PRN Thornton Park, MD      . morphine 2 MG/ML injection 2 mg  2 mg Intravenous Q2H PRN Thornton Park, MD   2 mg at 04/11/19 1831  . multivitamin with minerals tablet 1 tablet  1 tablet Oral Daily Thornton Park, MD   1 tablet at 04/11/19 1045  . naloxone [facility]) nasal spray 4 mg/0.1 mL  4 mg Nasal PRN Thornton Park, MD      . omega-3 acid ethyl esters (LOVAZA) capsule 1 g  1 g Oral BID Thornton Park, MD   1 g at 04/11/19 2128  . ondansetron (ZOFRAN) tablet 4 mg  4 mg Oral Q6H PRN Thornton Park, MD       Or  . ondansetron [facility]) injection 4 mg  4 mg Intravenous Q6H PRN Thornton Park, MD      . Oxcarbazepine (TRILEPTAL) tablet 600 mg  600 mg Oral BID Thornton Park, MD   600 mg at 04/11/19 1049  . oxyCODONE (Oxy IR/ROXICODONE) immediate release tablet 5-10 mg  5-10 mg Oral Q4H PRN Thornton Park, MD   10 mg at 04/11/19 1600  . oxyCODONE (OXYCONTIN) 12 hr tablet 20 mg  20 mg Oral Q12H Thornton Park, MD   20 mg at 04/11/19 2129  . pantoprazole  (PROTONIX) EC tablet 40 mg  40 mg Oral Daily Thornton Park, MD   40 mg at 04/11/19 1047  . polyethylene glycol (MIRALAX / GLYCOLAX) packet 17 g  17 g Oral BID Thornton Park, MD   17 g at 04/11/19 2128  . pregabalin (LYRICA) capsule 400 mg  400 mg Oral BID Thornton Park, MD   400 mg at 04/11/19 2128  . senna (SENOKOT) tablet 8.6 mg  1 tablet Oral BID Thornton Park, MD   8.6 mg at 04/11/19 2129  . sodium phosphate (FLEET) 7-19 GM/118ML enema 1 enema  1 enema Rectal Once PRN Thornton Park, MD      . tamsulosin [facility]) capsule 0.4 mg  0.4 mg Oral q1800 Thornton Park, MD   0.4 mg at 04/11/19 1756  . vancomycin (VANCOCIN) 1,250 mg in sodium chloride 0.9 % 250 mL IVPB  1,250 mg Intravenous Q12H Thornton Park, MD 166.7 mL/hr at 04/11/19 1612 1,250 mg at 04/11/19 1612  . vitamin C (ASCORBIC ACID) tablet 500 mg  500 mg Oral BID Thornton Park, MD   500 mg at 04/11/19 2128     Abtx:  Anti-infectives (From admission, onward)   Start     Dose/Rate Route Frequency Ordered Stop   04/12/19 1000  cefTRIAXone (ROCEPHIN) 2 g in sodium chloride 0.9 % 100 mL IVPB     2 g 200 mL/hr over 30 Minutes Intravenous Every 24 hours 04/12/19 0815     04/11/19 1600  vancomycin (VANCOCIN) 1,250 mg in sodium chloride 0.9 % 250 mL IVPB     1,250 mg 166.7 mL/hr over 90 Minutes Intravenous Every 12 hours 04/11/19 1427     04/11/19 1200  levofloxacin (LEVAQUIN) IVPB 500 mg  Status:  Discontinued     500 mg 100 mL/hr over 60 Minutes Intravenous Every 24 hours 04/11/19 1157 04/12/19 0815   04/11/19 0300  vancomycin (VANCOCIN) IVPB 1000 mg/200 mL premix  Status:  Discontinued  1,000 mg 200 mL/hr over 60 Minutes Intravenous Every 12 hours 04/10/19 1402 04/10/19 1409   04/10/19 1900  vancomycin (VANCOCIN) IVPB 1000 mg/200 mL premix     1,000 mg 200 mL/hr over 60 Minutes Intravenous  Once 04/10/19 1840 04/10/19 2045   04/10/19 1430  vancomycin (VANCOCIN) 1,500 mg in sodium chloride 0.9 % 500 mL IVPB   Status:  Discontinued     1,500 mg 250 mL/hr over 120 Minutes Intravenous  Once 04/10/19 1356 04/10/19 1410   04/10/19 1000  azithromycin (ZITHROMAX) 500 mg in sodium chloride 0.9 % 250 mL IVPB     500 mg 250 mL/hr over 60 Minutes Intravenous  Once 04/10/19 0946 04/10/19 1151   04/10/19 1000  vancomycin (VANCOCIN) IVPB 1000 mg/200 mL premix     1,000 mg 200 mL/hr over 60 Minutes Intravenous  Once 04/10/19 0946 04/10/19 1338      REVIEW OF SYSTEMS:  Const: negative fever, negative chills, negative weight loss Eyes: negative diplopia or visual changes, negative eye pain ENT: negative coryza, negative sore throat Resp: negative cough, hemoptysis, dyspnea Cards: negative for chest pain, palpitations, lower extremity edema GU: negative for frequency, dysuria and hematuria GI: Negative for abdominal pain, diarrhea, bleeding, constipation Skin: negative for rash and pruritus Heme: negative for easy bruising and gum/nose bleeding MS: weakness Neurolo:negative for headaches, dizziness, vertigo, memory problems  Psych: negative for feelings of anxiety, depression  Endocrine: diabetes Allergy/Immunology- as above: Objective:  VITALS:  BP 138/65 (BP Location: Left Arm)   Pulse (!) 59   Temp 98.4 F (36.9 C)   Resp 16   Ht 5\' 8"  (1.727 m)   Wt 122.5 kg   SpO2 97%   BMI 41.05 kg/m  PHYSICAL EXAM:  General:was sleeping but woke up and answered questions appropriately Head: Normocephalic, without obvious abnormality, atraumatic. Eyes: Conjunctivae clear, anicteric sclerae. Pupils are equal ENT Nares normal. No drainage or sinus tenderness. Lips, mucosa, and tongue normal. No Thrush Neck: Supple,  Lungs: b/l air entry Heart: s1s2. Pacemaker site clean Abdomen: Soft, non-tender,not distended. Bowel sounds normal. No masses Extremities: left leg surgical dressing not removed Skin: No rashes or lesions. Or bruising Lymph: Cervical, supraclavicular normal. Neurologic: Grossly non-focal  Pertinent Labs Lab Results CBC    Component Value Date/Time   WBC 5.1 04/11/2019 0551   RBC 3.77 (L) 04/11/2019 0551   HGB 9.3 (L) 04/11/2019 0551   HGB 12.3 (L) 01/28/2019 1000   HCT 29.7 (L) 04/11/2019 0551   HCT 35.9 (L) 01/28/2019 1000   PLT 301 04/11/2019 0551   PLT 292 01/28/2019 1000   MCV 78.8 (L) 04/11/2019 0551   MCV 78 (L) 01/28/2019 1000   MCV 78 (L) 02/06/2014 1628   MCH 24.7 (L) 04/11/2019 0551   MCHC 31.3 04/11/2019 0551   RDW 16.4 (H) 04/11/2019 0551   RDW 16.8 (H) 01/28/2019 1000   RDW 17.9 (H) 02/06/2014 1628   LYMPHSABS 1.9 04/10/2019 0917   LYMPHSABS 1.9 01/28/2019 1000   LYMPHSABS 1.7 12/16/2013 0324   MONOABS 0.5 04/10/2019 0917   MONOABS 0.3 12/16/2013 0324   EOSABS 0.2 04/10/2019 0917   EOSABS 0.2 01/28/2019 1000   EOSABS 0.2 12/16/2013 0324   BASOSABS 0.1 04/10/2019 0917   BASOSABS 0.1 01/28/2019 1000   BASOSABS 0.1 12/16/2013 0324    CMP Latest Ref Rng & Units 04/12/2019 04/11/2019 04/10/2019  Glucose 70 - 99 mg/dL 195(H) 165(H) 177(H)  BUN 8 - 23 mg/dL 16 14 18   Creatinine 0.61 - 1.24  mg/dL 0.51(L) 0.66 0.87  Sodium 135 - 145 mmol/L 141 140 139  Potassium 3.5 - 5.1 mmol/L 3.7 3.7 3.9  Chloride 98 - 111 mmol/L 106 106 101  CO2 22 - 32 mmol/L 27 27 26   Calcium 8.9 - 10.3 mg/dL 9.0 8.7(L) 9.1  Total Protein 6.5 - 8.1 g/dL - - 7.6  Total Bilirubin 0.3 - 1.2 mg/dL - - 0.5  Alkaline Phos 38 - 126 U/L - - 64  AST 15 - 41 U/L - - 17  ALT 0 - 44 U/L - - 14      Microbiology: Recent Results (from the past 240 hour(s))  SARS CORONAVIRUS 2 (TAT 6-24 HRS) Nasopharyngeal Nasopharyngeal Swab     Status: None   Collection Time: 04/08/19  1:20 PM   Specimen: Nasopharyngeal Swab  Result Value Ref Range Status   SARS Coronavirus 2 NEGATIVE NEGATIVE Final    Comment: (NOTE) SARS-CoV-2 target nucleic acids are NOT DETECTED. The SARS-CoV-2 RNA is generally detectable in upper and lower respiratory specimens during the acute phase of infection.  Negative results do not preclude SARS-CoV-2 infection, do not rule out co-infections with other pathogens, and should not be used as the sole basis for treatment or other patient management decisions. Negative results must be combined with clinical observations, patient history, and epidemiological information. The expected result is Negative. Fact Sheet for Patients: SugarRoll.be Fact Sheet for Healthcare Providers: https://www.woods-mathews.com/ This test is not yet approved or cleared by the [place] FDA and  has been authorized for detection and/or diagnosis of SARS-CoV-2 by FDA under an Emergency Use Authorization (EUA). This EUA will remain  in effect (meaning this test can be used) for the duration of the COVID-19 declaration under Section 56 4(b)(1) of the Act, 21 U.S.C. section 360bbb-3(b)(1), unless the authorization is terminated or revoked sooner. Performed at [facility], [place] [address], [place], [place] [address]   Urine culture     Status: Abnormal   Collection Time: 04/10/19 11:28 AM   Specimen: Urine, Random  Result Value Ref Range Status   Specimen Description   Final    URINE, RANDOM Performed at [facility], [address] [place], [place] [address]    Special Requests   Final    NONE Performed at [facility], [address] [place], [place] [address]    Culture >=100,000 COLONIES/mL ESCHERICHIA COLI (A)  Final   Report Status 04/12/2019 FINAL  Final   Organism ID, Bacteria ESCHERICHIA COLI (A)  Final      Susceptibility   Escherichia coli - MIC*    AMPICILLIN 16 INTERMEDIATE Intermediate     CEFAZOLIN <=4 SENSITIVE Sensitive     CEFTRIAXONE <=1 SENSITIVE Sensitive     CIPROFLOXACIN >=4 RESISTANT Resistant     GENTAMICIN <=1 SENSITIVE Sensitive     IMIPENEM <=0.25 SENSITIVE Sensitive     NITROFURANTOIN 32 SENSITIVE Sensitive     TRIMETH/SULFA <=20 SENSITIVE Sensitive      AMPICILLIN/SULBACTAM 4 SENSITIVE Sensitive     PIP/TAZO 8 SENSITIVE Sensitive     Extended ESBL NEGATIVE Sensitive     * >=100,000 COLONIES/mL ESCHERICHIA COLI  Surgical pcr screen     Status: Abnormal   Collection Time: 04/12/19  7:10 AM   Specimen: Nasal Mucosa; Nasal Swab  Result Value Ref Range Status   MRSA, PCR NEGATIVE NEGATIVE Final   Staphylococcus aureus POSITIVE (A) NEGATIVE Final    Comment: (NOTE) The Xpert SA Assay (FDA approved for  NASAL specimens in patients 9 years of age and older), is one component of a comprehensive surveillance program. It is not intended to diagnose infection nor to guide or monitor treatment. Performed at [facility], [address] [place], [place] [address]   Aerobic/Anaerobic Culture (surgical/deep wound)     Status: None (Preliminary result)   Collection Time: 04/12/19  9:01 AM   Specimen: PATH Soft tissue  Result Value Ref Range Status   Specimen Description   Final    TISSUE Performed at [facility], [place] [address] [place], [place] [address]    Special Requests   Final    WOUND TISSUE LEFT ANKLE Performed at [facility], [address] [place], [place] [address]    Gram Stain PENDING  Incomplete   Culture PENDING  Incomplete   Report Status PENDING  Incomplete    IMAGING RESULTS:  04/10/19    04/12/19  I have personally reviewed the films ? Impression/Recommendation ? ? Infected hardware of ORIF left ankle bimalleolar fracture-s/p removal of hardware today- on vanco and ceftriaxone- await culture- will need minimum 6 weeks of antibiotics  Dm-on insulin  HTN-on Norvasc,benzoprezil HCTZ, frusemide  OSA  Hyperlipidemia-on fenofibrate,ezetimibe ? _h/o DFI  H/o amputation of toes  H/o PCN allergy- tolerates cephalosporins  PAD  Spine stimulator  Pacemaker   ID will follow him remotely this weekend- call If needed __________________________________________________ Discussed  with patient  Note:  This document was prepared using Dragon voice recognition software and may include unintentional dictation errors.

## 2019-04-13 DIAGNOSIS — E1169 Type 2 diabetes mellitus with other specified complication: Secondary | ICD-10-CM | POA: Diagnosis not present

## 2019-04-13 DIAGNOSIS — T847XXD Infection and inflammatory reaction due to other internal orthopedic prosthetic devices, implants and grafts, subsequent encounter: Secondary | ICD-10-CM | POA: Diagnosis not present

## 2019-04-13 DIAGNOSIS — E876 Hypokalemia: Secondary | ICD-10-CM

## 2019-04-13 DIAGNOSIS — D649 Anemia, unspecified: Secondary | ICD-10-CM

## 2019-04-13 DIAGNOSIS — L8990 Pressure ulcer of unspecified site, unspecified stage: Secondary | ICD-10-CM | POA: Insufficient documentation

## 2019-04-13 DIAGNOSIS — M86172 Other acute osteomyelitis, left ankle and foot: Secondary | ICD-10-CM | POA: Diagnosis not present

## 2019-04-13 DIAGNOSIS — I10 Essential (primary) hypertension: Secondary | ICD-10-CM | POA: Diagnosis not present

## 2019-04-13 LAB — CBC
HCT: 24.9 % — ABNORMAL LOW (ref 39.0–52.0)
Hemoglobin: 8 g/dL — ABNORMAL LOW (ref 13.0–17.0)
MCH: 24.7 pg — ABNORMAL LOW (ref 26.0–34.0)
MCHC: 32.1 g/dL (ref 30.0–36.0)
MCV: 76.9 fL — ABNORMAL LOW (ref 80.0–100.0)
Platelets: 241 10*3/uL (ref 150–400)
RBC: 3.24 MIL/uL — ABNORMAL LOW (ref 4.22–5.81)
RDW: 16.5 % — ABNORMAL HIGH (ref 11.5–15.5)
WBC: 4.4 10*3/uL (ref 4.0–10.5)
nRBC: 0 % (ref 0.0–0.2)

## 2019-04-13 LAB — SEDIMENTATION RATE: Sed Rate: 87 mm/hr — ABNORMAL HIGH (ref 0–20)

## 2019-04-13 LAB — BASIC METABOLIC PANEL
Anion gap: 10 (ref 5–15)
BUN: 15 mg/dL (ref 8–23)
CO2: 25 mmol/L (ref 22–32)
Calcium: 8.6 mg/dL — ABNORMAL LOW (ref 8.9–10.3)
Chloride: 107 mmol/L (ref 98–111)
Creatinine, Ser: 0.7 mg/dL (ref 0.61–1.24)
GFR calc Af Amer: >60 mL/min (ref >60)
GFR calc non Af Amer: >60 mL/min (ref >60)
Glucose, Bld: 156 mg/dL — ABNORMAL HIGH (ref 70–99)
Potassium: 3.4 mmol/L — ABNORMAL LOW (ref 3.5–5.1)
Sodium: 142 mmol/L (ref 135–145)

## 2019-04-13 LAB — GLUCOSE, CAPILLARY
Glucose-Capillary: 163 mg/dL — ABNORMAL HIGH (ref 70–99)
Glucose-Capillary: 137 mg/dL — ABNORMAL HIGH (ref 70–99)
Glucose-Capillary: 143 mg/dL — ABNORMAL HIGH (ref 70–99)
Glucose-Capillary: 201 mg/dL — ABNORMAL HIGH (ref 70–99)
Glucose-Capillary: 206 mg/dL — ABNORMAL HIGH (ref 70–99)

## 2019-04-13 LAB — C-REACTIVE PROTEIN: CRP: 2.9 mg/dL — ABNORMAL HIGH (ref <1.0)

## 2019-04-13 MED ORDER — POTASSIUM CHLORIDE CRYS ER 20 MEQ PO TBCR
40.0000 meq | EXTENDED_RELEASE_TABLET | Freq: Once | ORAL | Status: AC
  Administered 2019-04-13: 40 meq via ORAL
  Filled 2019-04-13: qty 2

## 2019-04-13 NOTE — Consult Note (Signed)
Pharmacy Antibiotic Note  Victor Wise is a 68 y.o. male admitted on 04/10/2019 with left ankle osteomyelitis with hardware infection.  Pharmacy was consulted for Vancomycin dosing. The patient initially reported an allergic reaction when therapy began, however, it was determined that this was related to the infusion rate. Notably, patient has been on Vancomycin with no prior issues related to that event - will continue to monitor. This is day #4 of IV antibiotics. According to ID he will need 6 weeks of IV antibiotics. His renal function has been stable with no leucocytosis or recent fevers.  Plan: 1) Continue Vancomycin 1250 mg IV every 12 hours AUC goal: 400-550 Expected AUC: 534.6 SCr used: 0.80 (rouded up) Cssmin: 15.3 T1/2: 9.1 h BMI 41.1 Vancomycin peak after 4th consecutive scheduled dose which is tomorrow at 0400 Vancomycin trough prior to 5th consecutive scheduled dose which is tomorrow at 1600  2) continue ceftriaxone 2g q24h   Height: 5\' 8"  (172.7 cm) Weight: 270 lb (122.5 kg) IBW/kg (Calculated) : 68.4  Temp (24hrs), Avg:97.8 F (36.6 C), Min:97.3 F (36.3 C), Max:98.4 F (36.9 C)  Recent Labs  Lab 04/10/19 0917 04/11/19 0551 04/12/19 0618 04/13/19 0544  WBC 6.8 5.1  --  4.4  CREATININE 0.87 0.66 0.51* 0.70    Estimated Creatinine Clearance: 114.1 mL/min (by C-G formula based on SCr of 0.7 mg/dL).    Allergies  Allergen Reactions  . Amoxicillin Other (See Comments)    Has never taken amoxicillin -ENT told him not to take (? Had skin test) Has patient had a PCN reaction causing immediate rash, facial/tongue/throat swelling, SOB or lightheadedness with hypotension: No Has patient had a PCN reaction causing severe rash involving mucus membranes or skin necrosis: No Has patient had a PCN reaction that required hospitalization No Has patient had a PCN reaction occurring within the last 10 years: No If above answers are "NO", then may proceed w/ Cephalo  . Other  Anaphylaxis, Itching and Other (See Comments)    Pt states that he is allergic to Endopa.  Reaction:  Anaphylaxis  Pt states that he is allergic to Metabisulfites Reaction:  Itching   . Penicillins Other (See Comments)    Arm turned "blue" at injection site (no treatment needed) Has patient had a PCN reaction causing immediate rash, facial/tongue/throat swelling, SOB or lightheadedness with hypotension: No Has patient had a PCN reaction causing severe rash involving mucus membranes or skin necrosis: No Has patient had a PCN reaction that required hospitalization No Has patient had a PCN reaction occurring within the last 10 years: No If all of the above answers are "NO", then may proceed with Cephalosporin use.  [name] Hydralazine Other (See Comments)    Reaction:  Cramping of extremities Pt reports tolerating low dose 12.5mg  but no higher.  . Cephalexin Itching  . Clindamycin Itching  . Crestor [Rosuvastatin Calcium] Other (See Comments)    Reaction:  Pt is unable to move arms/legs   . Metoprolol Nausea And Vomiting  . Red Dye Itching  . Statins Other (See Comments)    Reaction:  Pt is unable to move arms/legs  . Sulfa Antibiotics Itching    Other reaction(s): Unknown  . Yellow Dyes (Non-Tartrazine) Itching  . Aspirin Itching and Other (See Comments)    Pt states that he is able to use in lower doses.    . Tape Rash    Please use "paper" tape only.    Antimicrobials this admission: 11/25 vancomycin >>  11/26  levofloxacin >>11/26 11/27 Ceftriaxone >>  Microbiology results: 11/25 UCx: E coli (ceftriaxone sensitive/cipro resistant) 11/23 SARS CoV-2: negative   Thank you for allowing pharmacy to be a part of this patient's care.  Dallie Piles, PharmD, BCPS Clinical Pharmacist 04/13/2019 1:35 PM

## 2019-04-13 NOTE — Evaluation (Signed)
Physical Therapy Evaluation Patient Details Name: Victor Wise MRN: [id number] DOB: 30-May-1950 Today's Date: 04/13/2019   History of Present Illness  68 yo male was readmitted from SNF with a gradually declining situation on L ankle, with wound dehiscence 7 weeks post op, after going to a walking boot.  Wound declined, then was taken to surgery on 11/27, with I and D, removal of L fibular hardware and exchange of wound vac.  Pt is NWB and referred to PT to resume mobility.  PMHx:  pacemaker, CAD, stent, DM, depression, GERD, HTN  Clinical Impression  Pt was seen for mobility with a limitation by pt on his willingness to move off the bed.  He is quite able to move, having demonstrated rolling and scooting on the bed.  However, was unwilling to try sitting side of bed.  Continue to encourage him to work given his limited tolerance for therapy in SNF in the past.  Follow up as tolerated and work toward being OOB.    Follow Up Recommendations SNF    Equipment Recommendations  None recommended by PT    Recommendations for Other Services       Precautions / Restrictions Precautions Precautions: Fall Precaution Comments: LLE NWB with splint Restrictions Weight Bearing Restrictions: Yes      Mobility  Bed Mobility Overal bed mobility: Modified Independent             General bed mobility comments: can roll and move on bed with no help  Transfers Overall transfer level: Needs assistance               General transfer comment: declined to get fully up to side of bed  Ambulation/Gait             General Gait Details: could not get pt to side of bed  Stairs            Wheelchair Mobility    Modified Rankin (Stroke Patients Only)       Balance                                             Pertinent Vitals/Pain Pain Assessment: Faces Faces Pain Scale: Hurts even more Pain Location: L ankle Pain Descriptors / Indicators: Operative  site guarding;Grimacing Pain Intervention(s): Limited activity within patient's tolerance;Monitored during session;Premedicated before session;Repositioned    Home Living Family/patient expects to be discharged to:: Skilled nursing facility   Available Help at Discharge: Available PRN/intermittently;Friend(s);Family Type of Home: Apartment Home Access: Level entry     Home Layout: One level Home Equipment: Grab bars - toilet;Grab bars - tub/shower;Walker - 2 wheels;Hand held shower head;Shower seat - built in;Cane - single point;Wheelchair - manual;Cane - quad Additional Comments: wishes to go home    Prior Function Level of Independence: Independent with assistive device(s)         Comments: Pt has been in SNF and not sure if he is going to be readmitted     Hand Dominance   Dominant Hand: Right    Extremity/Trunk Assessment   Upper Extremity Assessment Upper Extremity Assessment: Generalized weakness    Lower Extremity Assessment Lower Extremity Assessment: Generalized weakness    Cervical / Trunk Assessment Cervical / Trunk Assessment: Normal  Communication   Communication: No difficulties  Cognition Arousal/Alertness: Lethargic Behavior During Therapy: Flat affect Overall Cognitive Status: Within Functional Limits  for tasks assessed                                 General Comments: pt is not willing to try to sit up on side of bed      General Comments General comments (skin integrity, edema, etc.): pt is moving well initially and talked with him about lying on his LLE, but pt refused to remain on R side    Exercises     Assessment/Plan    PT Assessment Patient needs continued PT services  PT Problem List Decreased strength;Decreased range of motion;Decreased activity tolerance;Decreased balance;Decreased mobility;Decreased coordination;Decreased knowledge of use of DME;Decreased safety awareness;Decreased cognition;Cardiopulmonary status  limiting activity;Obesity;Pain;Decreased skin integrity       PT Treatment Interventions DME instruction;Gait training;Functional mobility training;Therapeutic activities;Therapeutic exercise;Balance training;Neuromuscular re-education;Patient/family education    PT Goals (Current goals can be found in the Care Plan section)  Acute Rehab PT Goals Patient Stated Goal: to walk and get OOB PT Goal Formulation: With patient Time For Goal Achievement: 04/27/19    Frequency Min 2X/week   Barriers to discharge Inaccessible home environment;Decreased caregiver support home with restrictions of help and not ambulatory    Co-evaluation               AM-PAC PT "6 Clicks" Mobility  Outcome Measure Help needed turning from your back to your side while in a flat bed without using bedrails?: None Help needed moving from lying on your back to sitting on the side of a flat bed without using bedrails?: None Help needed moving to and from a bed to a chair (including a wheelchair)?: A Lot Help needed standing up from a chair using your arms (e.g., wheelchair or bedside chair)?: A Lot Help needed to walk in hospital room?: A Lot Help needed climbing 3-5 steps with a railing? : Total 6 Click Score: 15    End of Session Equipment Utilized During Treatment: Oxygen(cpap) Activity Tolerance: Patient limited by fatigue;Patient limited by pain Patient left: in bed;with call bell/phone within reach;with bed alarm set;with nursing/sitter in room Nurse Communication: Mobility status PT Visit Diagnosis: Muscle weakness (generalized) (M62.81);Pain Pain - Right/Left: Left Pain - part of body: Leg    Time: 0945-1010 PT Time Calculation (min) (ACUTE ONLY): 25 min   Charges:   PT Evaluation $PT Eval Moderate Complexity: 1 Mod PT Treatments $Therapeutic Activity: 8-22 mins       Ramond Dial 04/13/2019, 8:56 PM   Mee Hives, PT MS Acute Rehab Dept. Number: [place]

## 2019-04-13 NOTE — Progress Notes (Addendum)
Patient ID: Victor Wise, male   DOB: 08/10/1950, 68 y.o.   MRN: [id number] [facility] PROGRESS NOTE  GERMAINE RAMCHANDANI [id number] DOB: 1950-06-30 DOA: 04/10/2019 PCP: Lavera Guise, MD  HPI/Subjective: Patient slept last night.  He was on CPAP this morning when I saw him.  Still having pain in the left ankle.  No shortness of breath or chest pain.  Objective: Vitals:   04/13/19 0208 04/13/19 0816  BP: (!) 118/44 133/67  Pulse: (!) 59 61  Resp: 18 18  Temp: (!) 97.5 F (36.4 C) (!) 97.3 F (36.3 C)  SpO2: 96% 96%    Intake/Output Summary (Last 24 hours) at 04/13/2019 1231 Last data filed at 04/13/2019 1207 Gross per 24 hour  Intake 1306.37 ml  Output 1210 ml  Net 96.37 ml   Filed Weights   04/10/19 0840  Weight: 122.5 kg    ROS: Review of Systems  Constitutional: Negative for chills and fever.  Eyes: Negative for blurred vision.  Respiratory: Negative for cough and shortness of breath.   Cardiovascular: Negative for chest pain.  Gastrointestinal: Negative for abdominal pain, constipation, diarrhea, nausea and vomiting.  Genitourinary: Negative for dysuria.  Musculoskeletal: Positive for joint pain.  Neurological: Negative for dizziness and headaches.   Exam: Physical Exam  HENT:  Nose: No mucosal edema.  Mouth/Throat: No oropharyngeal exudate or posterior oropharyngeal edema.  Eyes: Pupils are equal, round, and reactive to light. Conjunctivae, EOM and lids are normal.  Neck: No JVD present. Carotid bruit is not present. No edema present. No thyroid mass and no thyromegaly present.  Cardiovascular: S1 normal and S2 normal. Exam reveals no gallop.  No murmur heard. Pulses:      Dorsalis pedis pulses are 2+ on the left side.  Respiratory: No respiratory distress. He has no wheezes. He has no rhonchi. He has no rales.  GI: Soft. Bowel sounds are normal. There is no abdominal tenderness.  Musculoskeletal:     Right ankle: He exhibits no swelling.     Left  ankle: He exhibits no swelling.  Lymphadenopathy:    He has no cervical adenopathy.  Neurological: He is alert. No cranial nerve deficit.  Skin: Skin is warm. Nails show no clubbing.  Left lower leg and ankle wrapped.  Psychiatric: He has a normal mood and affect.      Data Reviewed: Basic Metabolic Panel: Recent Labs  Lab 04/10/19 0917 04/11/19 0551 04/12/19 0618 04/13/19 0544  NA 139 140 141 142  K 3.9 3.7 3.7 3.4*  CL 101 106 106 107  CO2 26 27 27 25   GLUCOSE 177* 165* 195* 156*  BUN 18 14 16 15   CREATININE 0.87 0.66 0.51* 0.70  CALCIUM 9.1 8.7* 9.0 8.6*   Liver Function Tests: Recent Labs  Lab 04/10/19 0917  AST 17  ALT 14  ALKPHOS 64  BILITOT 0.5  PROT 7.6  ALBUMIN 3.4*   CBC: Recent Labs  Lab 04/10/19 0917 04/11/19 0551 04/13/19 0544  WBC 6.8 5.1 4.4  NEUTROABS 4.1  --   --   HGB 10.6* 9.3* 8.0*  HCT 33.3* 29.7* 24.9*  MCV 76.7* 78.8* 76.9*  PLT 393 301 241     Recent Results (from the past 240 hour(s))  SARS CORONAVIRUS 2 (TAT 6-24 HRS) Nasopharyngeal Nasopharyngeal Swab     Status: None   Collection Time: 04/08/19  1:20 PM   Specimen: Nasopharyngeal Swab  Result Value Ref Range Status   SARS Coronavirus 2 NEGATIVE NEGATIVE Final  Comment: (NOTE) SARS-CoV-2 target nucleic acids are NOT DETECTED. The SARS-CoV-2 RNA is generally detectable in upper and lower respiratory specimens during the acute phase of infection. Negative results do not preclude SARS-CoV-2 infection, do not rule out co-infections with other pathogens, and should not be used as the sole basis for treatment or other patient management decisions. Negative results must be combined with clinical observations, patient history, and epidemiological information. The expected result is Negative. Fact Sheet for Patients: SugarRoll.be Fact Sheet for Healthcare Providers: https://www.woods-mathews.com/ This test is not yet approved or  cleared by the [place] FDA and  has been authorized for detection and/or diagnosis of SARS-CoV-2 by FDA under an Emergency Use Authorization (EUA). This EUA will remain  in effect (meaning this test can be used) for the duration of the COVID-19 declaration under Section 56 4(b)(1) of the Act, 21 U.S.C. section 360bbb-3(b)(1), unless the authorization is terminated or revoked sooner. Performed at [facility], [place] [address], [place], [place] [address]   Urine culture     Status: Abnormal   Collection Time: 04/10/19 11:28 AM   Specimen: Urine, Random  Result Value Ref Range Status   Specimen Description   Final    URINE, RANDOM Performed at [facility], [address] [place], [place] [address]    Special Requests   Final    NONE Performed at [facility], [address] [place], [place] [address]    Culture >=100,000 COLONIES/mL ESCHERICHIA COLI (A)  Final   Report Status 04/12/2019 FINAL  Final   Organism ID, Bacteria ESCHERICHIA COLI (A)  Final      Susceptibility   Escherichia coli - MIC*    AMPICILLIN 16 INTERMEDIATE Intermediate     CEFAZOLIN <=4 SENSITIVE Sensitive     CEFTRIAXONE <=1 SENSITIVE Sensitive     CIPROFLOXACIN >=4 RESISTANT Resistant     GENTAMICIN <=1 SENSITIVE Sensitive     IMIPENEM <=0.25 SENSITIVE Sensitive     NITROFURANTOIN 32 SENSITIVE Sensitive     TRIMETH/SULFA <=20 SENSITIVE Sensitive     AMPICILLIN/SULBACTAM 4 SENSITIVE Sensitive     PIP/TAZO 8 SENSITIVE Sensitive     Extended ESBL NEGATIVE Sensitive     * >=100,000 COLONIES/mL ESCHERICHIA COLI  Surgical pcr screen     Status: Abnormal   Collection Time: 04/12/19  7:10 AM   Specimen: Nasal Mucosa; Nasal Swab  Result Value Ref Range Status   MRSA, PCR NEGATIVE NEGATIVE Final   Staphylococcus aureus POSITIVE (A) NEGATIVE Final    Comment: (NOTE) The Xpert SA Assay (FDA approved for NASAL specimens in patients 34 years of age and older), is one component of a  comprehensive surveillance program. It is not intended to diagnose infection nor to guide or monitor treatment. Performed at [facility], [address] [place], [place] [address]   Aerobic/Anaerobic Culture (surgical/deep wound)     Status: None (Preliminary result)   Collection Time: 04/12/19  8:46 AM   Specimen: PATH Cytology Misc. fluid; Body Fluid  Result Value Ref Range Status   Specimen Description   Final    WOUND LEFT FIBULA Performed at [facility], [address] [place], [place] [address]    Special Requests   Final    NONE Performed at [facility], [place], [place] [address]    Gram Stain NO WBC SEEN NO ORGANISMS SEEN   Final   Culture   Final    CULTURE REINCUBATED FOR BETTER  GROWTH Performed at [facility], [place] [address] [place], [place] [address]    Report Status PENDING  Incomplete  Aerobic/Anaerobic Culture (surgical/deep wound)     Status: None (Preliminary result)   Collection Time: 04/12/19  9:01 AM   Specimen: PATH Cytology Misc. fluid; Body Fluid  Result Value Ref Range Status   Specimen Description   Final    WOUND Performed at [facility], [address], [place], [place] [address]    Special Requests   Final    LEFT ANKLE JOINT FLUID ASPIRATION Performed at [facility], [place], [place] [address]    Gram Stain NO WBC SEEN NO ORGANISMS SEEN   Final   Culture   Final    TOO YOUNG TO READ Performed at [facility], [place] [address], [place], [place] [address]    Report Status PENDING  Incomplete  Aerobic/Anaerobic Culture (surgical/deep wound)     Status: None (Preliminary result)   Collection Time: 04/12/19  9:01 AM   Specimen: PATH Soft tissue  Result Value Ref Range Status   Specimen Description   Final    TISSUE Performed at [facility], [place] [address], [place], [place] [address]    Special Requests   Final    WOUND TISSUE LEFT  ANKLE Performed at [facility], [address] [place], [place] [address]    Gram Stain   Final    MODERATE WBC PRESENT, PREDOMINANTLY PMN RARE GRAM POSITIVE COCCI    Culture   Final    CULTURE REINCUBATED FOR BETTER GROWTH Performed at [facility], [place] [address] [place], [place] [address]    Report Status PENDING  Incomplete  Aerobic/Anaerobic Culture (surgical/deep wound)     Status: None (Preliminary result)   Collection Time: 04/12/19  9:15 AM   Specimen: PATH Cytology Misc. fluid; Body Fluid  Result Value Ref Range Status   Specimen Description   Final    WOUND Performed at [facility], [address], [place], [place] [address]    Special Requests   Final    WOUND CULTURE LEFT ANKLE Performed at [facility], [address] [place], [place] [address]    Gram Stain NO WBC SEEN NO ORGANISMS SEEN   Final   Culture   Final    CULTURE REINCUBATED FOR BETTER GROWTH Performed at [facility], [place] [address], [place], [place] [address]    Report Status PENDING  Incomplete     Studies: Dg Ankle Left Port  Result Date: 04/12/2019 CLINICAL DATA:  Status post hardware removal. EXAM: PORTABLE LEFT ANKLE - 2 VIEW COMPARISON:  April 10, 2019. FINDINGS: Fixation hardware involving distal left fibula has been removed. No acute fracture or dislocation is noted. Wound VAC device is seen overlying the lateral portion of the distal left ankle. Lucencies noted on prior exam are not well visualized currently due to overlying cast. IMPRESSION: Fixation hardware involving distal left fibula has been removed. No acute fracture or dislocation is noted. Electronically Signed   By: Marijo Conception M.D.   On: 04/12/2019 14:09    Scheduled Meds: . amLODipine  10 mg Oral Daily  . aspirin EC  81 mg Oral Daily  . benazepril  20 mg Oral BID  . clopidogrel  75 mg Oral Daily  . DULoxetine  30 mg Oral QPM  . DULoxetine  60 mg Oral Daily  .  enoxaparin (LOVENOX)  injection  40 mg Subcutaneous Q24H  . ezetimibe  10 mg Oral Daily  . feeding supplement (PRO-STAT SUGAR FREE 64)  30 mL Oral Daily  . fenofibrate  54 mg Oral q1800  . furosemide  20 mg Oral Daily  . hydrochlorothiazide  12.5 mg Oral Daily  . insulin aspart  0-20 Units Subcutaneous TID WC  . insulin detemir  45 Units Subcutaneous QHS  . lactulose  20 g Oral See admin instructions  . linaclotide  290 mcg Oral QAC breakfast  . multivitamin with minerals  1 tablet Oral Daily  . omega-3 acid ethyl esters  1 g Oral BID  . oxcarbazepine  600 mg Oral BID  . oxyCODONE  20 mg Oral Q12H  . pantoprazole  40 mg Oral Daily  . polyethylene glycol  17 g Oral BID  . potassium chloride  40 mEq Oral Once  . pregabalin  400 mg Oral BID  . senna  1 tablet Oral BID  . tamsulosin  0.4 mg Oral q1800  . vitamin C  500 mg Oral BID   Continuous Infusions: . cefTRIAXone (ROCEPHIN)  IV    . vancomycin 1,250 mg (04/13/19 0456)    Assessment/Plan:  1. left ankle osteomyelitis with hardware infection.  Patient had wound dehiscence and exposed hardware.  No contraindications to surgery at this time.  Patient currently on vancomycin and Rocephin.  As per infectious disease, patient will need long-term IV antibiotics.  Patient will be nonweightbearing on left lower extremity for a long period of time.  Will end up needing rehab again. 2. Type 2 diabetes mellitus with hyperglycemia and hyperlipidemia and prior toe amputations.  Increase Levemir to 45 units nightly.  Normally takes twice daily dosing but sugars were on the lower side this morning so we will just use nighttime dosing at this point. 3. Essential hypertension on amlodipine, benazepril, hydrochlorothiazide and low-dose Lasix. 4. Chronic systolic congestive heart failure with EF of 45%.  Stop IV fluids. 5. UTI.  Urine culture on 11/25 showing E. coli which was resistant to Cipro.  Antibiotics changed yesterday to  Rocephin. 6. Hypokalemia.  Replace potassium orally 7. Postoperative anemia.  Watch hemoglobin closely.  Discontinue IV fluids.  Code Status:     Code Status Orders  (From admission, onward)         Start     Ordered   04/10/19 1050  Full code  Continuous     04/10/19 1058        Code Status History    Date Active Date Inactive Code Status Order ID Comments User Context   01/31/2019 1801 02/07/2019 2006 Full Code [id number]  Thornton Park, MD Inpatient   04/18/2018 1725 04/23/2018 2207 Full Code [id number]  Henreitta Leber, MD Inpatient   03/01/2018 2323 03/13/2018 2210 Full Code [id number]  Lance Coon, MD Inpatient   02/18/2016 1628 02/21/2016 1556 Full Code [id number]  Theodoro Grist, MD ED   10/26/2015 2111 11/03/2015 1616 Full Code [id number]  Vaughan Basta, MD Inpatient   10/19/2015 2200 10/21/2015 2351 Full Code [id number]  Gladstone Lighter, MD Inpatient   12/19/2014 2131 12/21/2014 1710 Full Code [id number]  Charolette Forward, MD Inpatient   12/19/2014 2123 12/19/2014 2131 Full Code [id number]  Charolette Forward, MD Inpatient   12/19/2014 1238 12/19/2014 1718 Full Code [id number]  Dionisio Berley, MD Inpatient   12/18/2014 1836 12/19/2014 1238 Full Code [id number]  Henreitta Leber, MD Inpatient   Advance Care Planning Activity    Advance Directive  Documentation     Most Recent Value  Type of Advance Directive  Healthcare Power of Attorney, Living will  Pre-existing out of facility DNR order (yellow form or pink MOST form)  -  "MOST" Form in Place?  -     Family Communication: Spoke with the patient's daughter on the phone. Disposition Plan: Likely will need to go back to rehab  Consultants:  Orthopedic surgery  Antibiotics:  Vancomycin  Rocephin  Time spent: 27 minutes  [place]

## 2019-04-13 NOTE — Progress Notes (Signed)
OT Cancellation Note  Patient Details Name: Victor Wise MRN: [id number] DOB: [birth year]   Cancelled Treatment:    Reason Eval/Treat Not Completed: Patient declined, no reason specified Order received and chart reviewed.  Pt refused meds and PT earlier today and declined participation in OT evaluation as well.  Will attempt again on 04/15/19 and per chart is non WBing LLE and will most likely need rehab.    Chrys Racer, OTR/L, NTMTC ascom [phone] 04/13/19, 1:51 PM

## 2019-04-13 NOTE — Progress Notes (Signed)
Pt refused to take all medications at this time.

## 2019-04-13 NOTE — Progress Notes (Signed)
Subjective:  POD #1 s/p I&D, hardware removal and VAC placement left ankle wound dehiscence..   Patient reports left ankle pain as moderate.  Patient's nurse states he is refusing his medication and physical therapy.  Objective:   VITALS:   Vitals:   04/12/19 1600 04/12/19 2300 04/13/19 0208 04/13/19 0816  BP: 132/80  (!) 118/44 133/67  Pulse: 70 72 (!) 59 61  Resp: 18 18 18 18   Temp: 98 F (36.7 C)  (!) 97.5 F (36.4 C) (!) 97.3 F (36.3 C)  TempSrc: Oral  Oral Oral  SpO2: 98% 99% 96% 96%  Weight:      Height:        PHYSICAL EXAM: Left lower extremity: VAC dressing is functioning.  Splint and dressing remain clean dry and intact.  Patient's left foot remains perfused.   LABS  Results for orders placed or performed during the hospital encounter of 04/10/19 (from the past 24 hour(s))  Glucose, capillary     Status: Abnormal   Collection Time: 04/12/19 10:30 AM  Result Value Ref Range   Glucose-Capillary 204 (H) 70 - 99 mg/dL  Glucose, capillary     Status: Abnormal   Collection Time: 04/12/19 12:50 PM  Result Value Ref Range   Glucose-Capillary 214 (H) 70 - 99 mg/dL  Glucose, capillary     Status: Abnormal   Collection Time: 04/12/19  4:45 PM  Result Value Ref Range   Glucose-Capillary 221 (H) 70 - 99 mg/dL   Comment 1 Notify RN   CBC     Status: Abnormal   Collection Time: 04/13/19  5:44 AM  Result Value Ref Range   WBC 4.4 4.0 - 10.5 K/uL   RBC 3.24 (L) 4.22 - 5.81 MIL/uL   Hemoglobin 8.0 (L) 13.0 - 17.0 g/dL   HCT 24.9 (L) 39.0 - 52.0 %   MCV 76.9 (L) 80.0 - 100.0 fL   MCH 24.7 (L) 26.0 - 34.0 pg   MCHC 32.1 30.0 - 36.0 g/dL   RDW 16.5 (H) 11.5 - 15.5 %   Platelets 241 150 - 400 K/uL   nRBC 0.0 0.0 - 0.2 %  Basic metabolic panel     Status: Abnormal   Collection Time: 04/13/19  5:44 AM  Result Value Ref Range   Sodium 142 135 - 145 mmol/L   Potassium 3.4 (L) 3.5 - 5.1 mmol/L   Chloride 107 98 - 111 mmol/L   CO2 25 22 - 32 mmol/L   Glucose, Bld 156  (H) 70 - 99 mg/dL   BUN 15 8 - 23 mg/dL   Creatinine, Ser 0.70 0.61 - 1.24 mg/dL   Calcium 8.6 (L) 8.9 - 10.3 mg/dL   GFR calc non Af Amer >60 >60 mL/min   GFR calc Af Amer >60 >60 mL/min   Anion gap 10 5 - 15  Sedimentation rate     Status: Abnormal   Collection Time: 04/13/19  5:44 AM  Result Value Ref Range   Sed Rate 87 (H) 0 - 20 mm/hr  Glucose, capillary     Status: Abnormal   Collection Time: 04/13/19  8:14 AM  Result Value Ref Range   Glucose-Capillary 137 (H) 70 - 99 mg/dL    Dg Ankle Left Port  Result Date: 04/12/2019 CLINICAL DATA:  Status post hardware removal. EXAM: PORTABLE LEFT ANKLE - 2 VIEW COMPARISON:  April 10, 2019. FINDINGS: Fixation hardware involving distal left fibula has been removed. No acute fracture or dislocation is noted. Wound VAC  device is seen overlying the lateral portion of the distal left ankle. Lucencies noted on prior exam are not well visualized currently due to overlying cast. IMPRESSION: Fixation hardware involving distal left fibula has been removed. No acute fracture or dislocation is noted. Electronically Signed   By: Marijo Conception M.D.   On: 04/12/2019 14:09    Assessment/Plan: 1 Day Post-Op   Principal Problem:   Infected hardware in left lower extremity ([place]) Active Problems:   Mixed hyperlipidemia   Essential hypertension   OSA on CPAP   Benign essential hypertension   Coronary artery disease due to lipid rich plaque   Type 2 diabetes mellitus with hyperlipidemia (HCC)   Benign prostatic hyperplasia without lower urinary tract symptoms   Obesity, Class III, BMI 40-49.9 (morbid obesity) ([place])   Bimalleolar fracture of left ankle   Wound dehiscence, surgical   Pyogenic inflammation of bone (HCC)   Preop examination   Pressure injury of skin  Explained to the patient that I would like him to change positions to avoid pressure on the lateral aspect of the left ankle.  I am going to order him a hospital bed that will allow for  frequent turning to avoid pressure on the left lateral ankle and avoid pressure ulcers in the lower extremities.  I am also going to order boots for his heels as his nurse reports he is developing heel ulcer.  I encouraged the patient to make an attempt to get up out of bed to a chair.  He is nonweightbearing on the left lower extremity but would benefit from sitting upright.  This would help his breathing and help to avoid continuous pressure on his back and posterior lower extremities.  Patient will continue IV antibiotics per infectious disease.  Patient will likely need a VAC dressing change early next week.    Thornton Park , MD 04/13/2019, 10:23 AM

## 2019-04-14 DIAGNOSIS — M86072 Acute hematogenous osteomyelitis, left ankle and foot: Secondary | ICD-10-CM | POA: Diagnosis not present

## 2019-04-14 DIAGNOSIS — T847XXD Infection and inflammatory reaction due to other internal orthopedic prosthetic devices, implants and grafts, subsequent encounter: Secondary | ICD-10-CM | POA: Diagnosis not present

## 2019-04-14 DIAGNOSIS — E782 Mixed hyperlipidemia: Secondary | ICD-10-CM | POA: Diagnosis not present

## 2019-04-14 DIAGNOSIS — I10 Essential (primary) hypertension: Secondary | ICD-10-CM | POA: Diagnosis not present

## 2019-04-14 DIAGNOSIS — E1142 Type 2 diabetes mellitus with diabetic polyneuropathy: Secondary | ICD-10-CM

## 2019-04-14 LAB — GLUCOSE, CAPILLARY
Glucose-Capillary: 130 mg/dL — ABNORMAL HIGH (ref 70–99)
Glucose-Capillary: 160 mg/dL — ABNORMAL HIGH (ref 70–99)
Glucose-Capillary: 184 mg/dL — ABNORMAL HIGH (ref 70–99)
Glucose-Capillary: 213 mg/dL — ABNORMAL HIGH (ref 70–99)

## 2019-04-14 LAB — CBC
HCT: 25.4 % — ABNORMAL LOW (ref 39.0–52.0)
Hemoglobin: 7.9 g/dL — ABNORMAL LOW (ref 13.0–17.0)
MCH: 24.3 pg — ABNORMAL LOW (ref 26.0–34.0)
MCHC: 31.1 g/dL (ref 30.0–36.0)
MCV: 78.2 fL — ABNORMAL LOW (ref 80.0–100.0)
Platelets: 209 10*3/uL (ref 150–400)
RBC: 3.25 MIL/uL — ABNORMAL LOW (ref 4.22–5.81)
RDW: 16.6 % — ABNORMAL HIGH (ref 11.5–15.5)
WBC: 3.6 10*3/uL — ABNORMAL LOW (ref 4.0–10.5)
nRBC: 0 % (ref 0.0–0.2)

## 2019-04-14 LAB — CREATININE, SERUM
Creatinine, Ser: 0.91 mg/dL (ref 0.61–1.24)
GFR calc Af Amer: >60 mL/min (ref >60)
GFR calc non Af Amer: >60 mL/min (ref >60)

## 2019-04-14 LAB — VANCOMYCIN, PEAK: Vancomycin Pk: 30 ug/mL (ref 30–40)

## 2019-04-14 LAB — VANCOMYCIN, TROUGH: Vancomycin Tr: 18 ug/mL (ref 15–20)

## 2019-04-14 MED ORDER — VANCOMYCIN HCL 10 G IV SOLR
2000.0000 mg | INTRAVENOUS | Status: DC
  Administered 2019-04-15: 2000 mg via INTRAVENOUS
  Filled 2019-04-14: qty 2000

## 2019-04-14 NOTE — Progress Notes (Signed)
Patient ID: Victor Wise, male   DOB: [birth year], 68 y.o.   MRN: [id number] [facility] PROGRESS NOTE  Victor Wise [id number] DOB: 1950/07/10 DOA: 04/10/2019 PCP: Lavera Guise, MD  HPI/Subjective: Patient still having a lot of pain in his left ankle.  Otherwise felt okay.  No chest pain or shortness of breath.  Objective: Vitals:   04/14/19 0007 04/14/19 0820  BP: (!) 155/63 (!) 150/70  Pulse: 60 (!) 59  Resp: 18 17  Temp: 98.1 F (36.7 C) 97.9 F (36.6 C)  SpO2: 100% 100%    Intake/Output Summary (Last 24 hours) at 04/14/2019 1357 Last data filed at 04/14/2019 1116 Gross per 24 hour  Intake 240 ml  Output 1000 ml  Net -760 ml   Filed Weights   04/10/19 0840  Weight: 122.5 kg    ROS: Review of Systems  Constitutional: Negative for chills and fever.  Eyes: Negative for blurred vision.  Respiratory: Negative for cough and shortness of breath.   Cardiovascular: Negative for chest pain.  Gastrointestinal: Negative for abdominal pain, constipation, diarrhea, nausea and vomiting.  Genitourinary: Negative for dysuria.  Musculoskeletal: Positive for joint pain.  Neurological: Negative for dizziness and headaches.   Exam: Physical Exam  HENT:  Nose: No mucosal edema.  Mouth/Throat: No oropharyngeal exudate or posterior oropharyngeal edema.  Eyes: Pupils are equal, round, and reactive to light. Conjunctivae, EOM and lids are normal.  Neck: No JVD present. Carotid bruit is not present. No edema present. No thyroid mass and no thyromegaly present.  Cardiovascular: S1 normal and S2 normal. Exam reveals no gallop.  No murmur heard. Pulses:      Dorsalis pedis pulses are 2+ on the left side.  Respiratory: No respiratory distress. He has no wheezes. He has no rhonchi. He has no rales.  GI: Soft. Bowel sounds are normal. There is no abdominal tenderness.  Musculoskeletal:     Right ankle: He exhibits no swelling.     Left ankle: He exhibits no swelling.   Lymphadenopathy:    He has no cervical adenopathy.  Neurological: He is alert. No cranial nerve deficit.  Skin: Skin is warm. Nails show no clubbing.  Left lower leg and ankle wrapped.  Psychiatric: He has a normal mood and affect.      Data Reviewed: Basic Metabolic Panel: Recent Labs  Lab 04/10/19 0917 04/11/19 0551 04/12/19 0618 04/13/19 0544 04/14/19 0729  NA 139 140 141 142  --   K 3.9 3.7 3.7 3.4*  --   CL 101 106 106 107  --   CO2 26 27 27 25   --   GLUCOSE 177* 165* 195* 156*  --   BUN 18 14 16 15   --   CREATININE 0.87 0.66 0.51* 0.70 0.91  CALCIUM 9.1 8.7* 9.0 8.6*  --    Liver Function Tests: Recent Labs  Lab 04/10/19 0917  AST 17  ALT 14  ALKPHOS 64  BILITOT 0.5  PROT 7.6  ALBUMIN 3.4*   CBC: Recent Labs  Lab 04/10/19 0917 04/11/19 0551 04/13/19 0544 04/14/19 0729  WBC 6.8 5.1 4.4 3.6*  NEUTROABS 4.1  --   --   --   HGB 10.6* 9.3* 8.0* 7.9*  HCT 33.3* 29.7* 24.9* 25.4*  MCV 76.7* 78.8* 76.9* 78.2*  PLT 393 301 241 209     Recent Results (from the past 240 hour(s))  SARS CORONAVIRUS 2 (TAT 6-24 HRS) Nasopharyngeal Nasopharyngeal Swab     Status: None   Collection  Time: 04/08/19  1:20 PM   Specimen: Nasopharyngeal Swab  Result Value Ref Range Status   SARS Coronavirus 2 NEGATIVE NEGATIVE Final    Comment: (NOTE) SARS-CoV-2 target nucleic acids are NOT DETECTED. The SARS-CoV-2 RNA is generally detectable in upper and lower respiratory specimens during the acute phase of infection. Negative results do not preclude SARS-CoV-2 infection, do not rule out co-infections with other pathogens, and should not be used as the sole basis for treatment or other patient management decisions. Negative results must be combined with clinical observations, patient history, and epidemiological information. The expected result is Negative. Fact Sheet for Patients: SugarRoll.be Fact Sheet for Healthcare  Providers: https://www.woods-mathews.com/ This test is not yet approved or cleared by the [place] FDA and  has been authorized for detection and/or diagnosis of SARS-CoV-2 by FDA under an Emergency Use Authorization (EUA). This EUA will remain  in effect (meaning this test can be used) for the duration of the COVID-19 declaration under Section 56 4(b)(1) of the Act, 21 U.S.C. section 360bbb-3(b)(1), unless the authorization is terminated or revoked sooner. Performed at [facility], [place] [address] [place], [place] [address]   Urine culture     Status: Abnormal   Collection Time: 04/10/19 11:28 AM   Specimen: Urine, Random  Result Value Ref Range Status   Specimen Description   Final    URINE, RANDOM Performed at [facility], [address] [place], [place] [address]    Special Requests   Final    NONE Performed at [facility], [address] [place], [place] [address]    Culture >=100,000 COLONIES/mL ESCHERICHIA COLI (A)  Final   Report Status 04/12/2019 FINAL  Final   Organism ID, Bacteria ESCHERICHIA COLI (A)  Final      Susceptibility   Escherichia coli - MIC*    AMPICILLIN 16 INTERMEDIATE Intermediate     CEFAZOLIN <=4 SENSITIVE Sensitive     CEFTRIAXONE <=1 SENSITIVE Sensitive     CIPROFLOXACIN >=4 RESISTANT Resistant     GENTAMICIN <=1 SENSITIVE Sensitive     IMIPENEM <=0.25 SENSITIVE Sensitive     NITROFURANTOIN 32 SENSITIVE Sensitive     TRIMETH/SULFA <=20 SENSITIVE Sensitive     AMPICILLIN/SULBACTAM 4 SENSITIVE Sensitive     PIP/TAZO 8 SENSITIVE Sensitive     Extended ESBL NEGATIVE Sensitive     * >=100,000 COLONIES/mL ESCHERICHIA COLI  Surgical pcr screen     Status: Abnormal   Collection Time: 04/12/19  7:10 AM   Specimen: Nasal Mucosa; Nasal Swab  Result Value Ref Range Status   MRSA, PCR NEGATIVE NEGATIVE Final   Staphylococcus aureus POSITIVE (A) NEGATIVE Final    Comment: (NOTE) The Xpert SA Assay (FDA  approved for NASAL specimens in patients 80 years of age and older), is one component of a comprehensive surveillance program. It is not intended to diagnose infection nor to guide or monitor treatment. Performed at [facility], [address] [place], [place] [address]   Aerobic/Anaerobic Culture (surgical/deep wound)     Status: None (Preliminary result)   Collection Time: 04/12/19  8:46 AM   Specimen: PATH Cytology Misc. fluid; Body Fluid  Result Value Ref Range Status   Specimen Description   Final    WOUND LEFT FIBULA Performed at [facility], [address] [place], [place] [address]    Special Requests   Final    NONE Performed at [facility], [address] [place], [place] [address]  Gram Stain   Final    NO WBC SEEN NO ORGANISMS SEEN Performed at [facility], [place] [address], [place], [place] [address]    Culture FEW STAPHYLOCOCCUS AUREUS  Final   Report Status PENDING  Incomplete   Organism ID, Bacteria STAPHYLOCOCCUS AUREUS  Final      Susceptibility   Staphylococcus aureus - MIC*    CIPROFLOXACIN >=8 RESISTANT Resistant     ERYTHROMYCIN <=0.25 SENSITIVE Sensitive     GENTAMICIN <=0.5 SENSITIVE Sensitive     OXACILLIN <=0.25 SENSITIVE Sensitive     TETRACYCLINE <=1 SENSITIVE Sensitive     VANCOMYCIN <=0.5 SENSITIVE Sensitive     TRIMETH/SULFA <=10 SENSITIVE Sensitive     CLINDAMYCIN <=0.25 SENSITIVE Sensitive     RIFAMPIN <=0.5 SENSITIVE Sensitive     Inducible Clindamycin NEGATIVE Sensitive     * FEW STAPHYLOCOCCUS AUREUS  Aerobic/Anaerobic Culture (surgical/deep wound)     Status: None (Preliminary result)   Collection Time: 04/12/19  9:01 AM   Specimen: PATH Cytology Misc. fluid; Body Fluid  Result Value Ref Range Status   Specimen Description   Final    WOUND Performed at [facility], [address] [place], [place] [address]    Special Requests   Final    LEFT ANKLE JOINT FLUID  ASPIRATION Performed at [facility], [place], [place] [address]    Gram Stain NO WBC SEEN NO ORGANISMS SEEN   Final   Culture   Final    TOO YOUNG TO READ Performed at [facility], [place] [address], [place], [place] [address]    Report Status PENDING  Incomplete  Aerobic/Anaerobic Culture (surgical/deep wound)     Status: None (Preliminary result)   Collection Time: 04/12/19  9:01 AM   Specimen: PATH Soft tissue  Result Value Ref Range Status   Specimen Description   Final    TISSUE Performed at [facility], [place] [address], [place], [place] [address]    Special Requests   Final    WOUND TISSUE LEFT ANKLE Performed at [facility], [place], [place] [address]    Gram Stain   Final    MODERATE WBC PRESENT, PREDOMINANTLY PMN RARE GRAM POSITIVE COCCI Performed at [facility], [place] [address] [place], [place] [address]    Culture FEW STAPHYLOCOCCUS AUREUS  Final   Report Status PENDING  Incomplete   Organism ID, Bacteria STAPHYLOCOCCUS AUREUS  Final      Susceptibility   Staphylococcus aureus - MIC*    CIPROFLOXACIN >=8 RESISTANT Resistant     ERYTHROMYCIN <=0.25 SENSITIVE Sensitive     GENTAMICIN <=0.5 SENSITIVE Sensitive     OXACILLIN <=0.25 SENSITIVE Sensitive     TETRACYCLINE <=1 SENSITIVE Sensitive     VANCOMYCIN <=0.5 SENSITIVE Sensitive     TRIMETH/SULFA <=10 SENSITIVE Sensitive     CLINDAMYCIN <=0.25 SENSITIVE Sensitive     RIFAMPIN <=0.5 SENSITIVE Sensitive     Inducible Clindamycin NEGATIVE Sensitive     * FEW STAPHYLOCOCCUS AUREUS  Aerobic/Anaerobic Culture (surgical/deep wound)     Status: None (Preliminary result)   Collection Time: 04/12/19  9:15 AM   Specimen: PATH Cytology Misc. fluid; Body Fluid  Result Value Ref Range Status   Specimen Description   Final    WOUND Performed at [facility], [address], [place], [place] [address]    Special Requests   Final    WOUND  CULTURE LEFT ANKLE Performed at [facility], [address] [place], [place] [address]    Gram Stain   Final    NO WBC SEEN NO ORGANISMS SEEN Performed at [facility], [place] [address], [place], [place] [address]    Culture FEW STAPHYLOCOCCUS AUREUS  Final   Report Status PENDING  Incomplete   Organism ID, Bacteria STAPHYLOCOCCUS AUREUS  Final      Susceptibility   Staphylococcus aureus - MIC*    CIPROFLOXACIN >=8 RESISTANT Resistant     ERYTHROMYCIN <=0.25 SENSITIVE Sensitive     GENTAMICIN <=0.5 SENSITIVE Sensitive     OXACILLIN <=0.25 SENSITIVE Sensitive     TETRACYCLINE <=1 SENSITIVE Sensitive     VANCOMYCIN 1 SENSITIVE Sensitive     TRIMETH/SULFA <=10 SENSITIVE Sensitive     CLINDAMYCIN <=0.25 SENSITIVE Sensitive     RIFAMPIN <=0.5 SENSITIVE Sensitive     Inducible Clindamycin NEGATIVE Sensitive     * FEW STAPHYLOCOCCUS AUREUS     Studies: Dg Ankle Left Port  Result Date: 04/12/2019 CLINICAL DATA:  Status post hardware removal. EXAM: PORTABLE LEFT ANKLE - 2 VIEW COMPARISON:  April 10, 2019. FINDINGS: Fixation hardware involving distal left fibula has been removed. No acute fracture or dislocation is noted. Wound VAC device is seen overlying the lateral portion of the distal left ankle. Lucencies noted on prior exam are not well visualized currently due to overlying cast. IMPRESSION: Fixation hardware involving distal left fibula has been removed. No acute fracture or dislocation is noted. Electronically Signed   By: Marijo Conception M.D.   On: 04/12/2019 14:09    Scheduled Meds: . amLODipine  10 mg Oral Daily  . aspirin EC  81 mg Oral Daily  . benazepril  20 mg Oral BID  . clopidogrel  75 mg Oral Daily  . DULoxetine  30 mg Oral QPM  . DULoxetine  60 mg Oral Daily  . enoxaparin (LOVENOX) injection  40 mg Subcutaneous Q24H  . ezetimibe  10 mg Oral Daily  . feeding supplement (PRO-STAT SUGAR FREE 64)  30 mL Oral Daily  . fenofibrate  54 mg Oral q1800   . furosemide  20 mg Oral Daily  . hydrochlorothiazide  12.5 mg Oral Daily  . insulin aspart  0-20 Units Subcutaneous TID WC  . insulin detemir  45 Units Subcutaneous QHS  . lactulose  20 g Oral See admin instructions  . linaclotide  290 mcg Oral QAC breakfast  . multivitamin with minerals  1 tablet Oral Daily  . omega-3 acid ethyl esters  1 g Oral BID  . oxcarbazepine  600 mg Oral BID  . oxyCODONE  20 mg Oral Q12H  . pantoprazole  40 mg Oral Daily  . polyethylene glycol  17 g Oral BID  . pregabalin  400 mg Oral BID  . senna  1 tablet Oral BID  . tamsulosin  0.4 mg Oral q1800  . vitamin C  500 mg Oral BID   Continuous Infusions: . cefTRIAXone (ROCEPHIN)  IV 2 g (04/14/19 1030)  . vancomycin 1,250 mg (04/14/19 0338)    Assessment/Plan:  1. Left ankle osteomyelitis with hardware infection.  Patient had wound dehiscence and exposed hardware. Patient currently on vancomycin and Rocephin.  Infectious disease to fine-tune antibiotics tomorrow.  With all the patient's allergies, may be able to do IV Ancef since tolerating Rocephin.  Patient will be nonweightbearing on left lower extremity for a long period of time.  Will end up  needing rehab again.  Wound VAC will be changed on Tuesday may be able to get out to rehab on Wednesday. 2. Type 2 diabetes mellitus with hyperglycemia and hyperlipidemia and prior toe amputations, peripheral neuropathy..  Continue Levemir 45 units at night.  Patient states that he normally takes 70 units a day but with his sugars being good so far today I am hesitant about going up any further on his Levemir dosing.  Patient on high-dose Lyrica. 3. Essential hypertension on amlodipine, benazepril, hydrochlorothiazide and low-dose Lasix. 4. Chronic systolic congestive heart failure with EF of 45%.  No signs of congestive heart failure currently. 5. UTI.  Urine culture on 11/25 showing E. coli which was resistant to Cipro.  Patient on Rocephin. 6. Hypokalemia.  Replace  potassium orally 7. Postoperative anemia.  Hemoglobin down to 7.9.  May end up needing a transfusion at some point.  Will hold off currently.  Code Status:     Code Status Orders  (From admission, onward)         Start     Ordered   04/10/19 1050  Full code  Continuous     04/10/19 1058        Code Status History    Date Active Date Inactive Code Status Order ID Comments User Context   01/31/2019 1801 02/07/2019 2006 Full Code [id number]  Thornton Park, MD Inpatient   04/18/2018 1725 04/23/2018 2207 Full Code [id number]  Henreitta Leber, MD Inpatient   03/01/2018 2323 03/13/2018 2210 Full Code [id number]  Lance Coon, MD Inpatient   02/18/2016 1628 02/21/2016 1556 Full Code [id number]  Theodoro Grist, MD ED   10/26/2015 2111 11/03/2015 1616 Full Code [id number]  Vaughan Basta, MD Inpatient   10/19/2015 2200 10/21/2015 2351 Full Code [id number]  Gladstone Lighter, MD Inpatient   12/19/2014 2131 12/21/2014 1710 Full Code [id number]  Charolette Forward, MD Inpatient   12/19/2014 2123 12/19/2014 2131 Full Code [id number]  Charolette Forward, MD Inpatient   12/19/2014 1238 12/19/2014 1718 Full Code [id number]  Dionisio Patty, MD Inpatient   12/18/2014 1836 12/19/2014 1238 Full Code [id number]  Henreitta Leber, MD Inpatient   Advance Care Planning Activity    Advance Directive Documentation     Most Recent Value  Type of Advance Directive  Healthcare Power of Attorney, Living will  Pre-existing out of facility DNR order (yellow form or pink MOST form)  -  "MOST" Form in Place?  -     Family Communication: Spoke with the patient's daughter on the phone on 04/13/2019. Disposition Plan: Patient will have wound VAC change in the operating room on Tuesday and potentially out to rehab on Wednesday, December 2.  Consultants:  Orthopedic surgery  Antibiotics:  Vancomycin  Rocephin  Time spent: 26 minutes, case discussed with nursing staff and Dr. Geanie Cooley orthopedic surgery.  [place]  [facility]  [employer]

## 2019-04-14 NOTE — Progress Notes (Signed)
Subjective:  POD #2 s/p I&D, hardware removal and VAC dressing placement.   Patient reports left ankle pain as mild to moderate.  Patient denies shortness of breath, chest pain or abdominal pain.  She has no other complaints.  Objective:   VITALS:   Vitals:   04/13/19 0816 04/13/19 1646 04/14/19 0007 04/14/19 0820  BP: 133/67 (!) 145/84 (!) 155/63 (!) 150/70  Pulse: 61 63 60 (!) 59  Resp: 18 18 18 17   Temp: (!) 97.3 F (36.3 C) 97.6 F (36.4 C) 98.1 F (36.7 C) 97.9 F (36.6 C)  TempSrc: Oral Oral Oral Oral  SpO2: 96% 98% 100% 100%  Weight:      Height:        PHYSICAL EXAM: Left lower extremity: VAC dressing is functioning.  Collection canister is approximately half full.  Patient's foot appears perfused.  Patient can flex and extend his toes.  He has intact sensation light touch over the dorsum of his left foot.   LABS  Results for orders placed or performed during the hospital encounter of 04/10/19 (from the past 24 hour(s))  Glucose, capillary     Status: Abnormal   Collection Time: 04/13/19 12:06 PM  Result Value Ref Range   Glucose-Capillary 143 (H) 70 - 99 mg/dL  Glucose, capillary     Status: Abnormal   Collection Time: 04/13/19  4:34 PM  Result Value Ref Range   Glucose-Capillary 201 (H) 70 - 99 mg/dL  Glucose, capillary     Status: Abnormal   Collection Time: 04/13/19  9:48 PM  Result Value Ref Range   Glucose-Capillary 206 (H) 70 - 99 mg/dL   Comment 1 Notify RN   CBC     Status: Abnormal   Collection Time: 04/14/19  7:29 AM  Result Value Ref Range   WBC 3.6 (L) 4.0 - 10.5 K/uL   RBC 3.25 (L) 4.22 - 5.81 MIL/uL   Hemoglobin 7.9 (L) 13.0 - 17.0 g/dL   HCT 25.4 (L) 39.0 - 52.0 %   MCV 78.2 (L) 80.0 - 100.0 fL   MCH 24.3 (L) 26.0 - 34.0 pg   MCHC 31.1 30.0 - 36.0 g/dL   RDW 16.6 (H) 11.5 - 15.5 %   Platelets 209 150 - 400 K/uL   nRBC 0.0 0.0 - 0.2 %  Creatinine, serum     Status: None   Collection Time: 04/14/19  7:29 AM  Result Value Ref Range   Creatinine, Ser 0.91 0.61 - 1.24 mg/dL   GFR calc non Af Amer >60 >60 mL/min   GFR calc Af Amer >60 >60 mL/min  Vancomycin, peak     Status: None   Collection Time: 04/14/19  7:29 AM  Result Value Ref Range   Vancomycin Pk 30 30 - 40 ug/mL  Glucose, capillary     Status: Abnormal   Collection Time: 04/14/19  8:20 AM  Result Value Ref Range   Glucose-Capillary 130 (H) 70 - 99 mg/dL   Comment 1 Notify RN     Dg Ankle Left Port  Result Date: 04/12/2019 CLINICAL DATA:  Status post hardware removal. EXAM: PORTABLE LEFT ANKLE - 2 VIEW COMPARISON:  April 10, 2019. FINDINGS: Fixation hardware involving distal left fibula has been removed. No acute fracture or dislocation is noted. Wound VAC device is seen overlying the lateral portion of the distal left ankle. Lucencies noted on prior exam are not well visualized currently due to overlying cast. IMPRESSION: Fixation hardware involving distal left fibula has been  removed. No acute fracture or dislocation is noted. Electronically Signed   By: Marijo Conception M.D.   On: 04/12/2019 14:09    Assessment/Plan: 2 Days Post-Op   Principal Problem:   Infected hardware in left lower extremity (HCC) Active Problems:   Mixed hyperlipidemia   Essential hypertension   OSA on CPAP   Benign essential hypertension   Coronary artery disease due to lipid rich plaque   Type 2 diabetes mellitus with hyperlipidemia (HCC)   Benign prostatic hyperplasia without lower urinary tract symptoms   Obesity, Class III, BMI 40-49.9 (morbid obesity) ([place])   Bimalleolar fracture of left ankle   Wound dehiscence, surgical   Pyogenic inflammation of bone (HCC)   Preop examination   Pressure injury of skin   Anemia   Hypokalemia  Patient will continue IV antibiotics.  Plan for [facility] dressing change Tuesday.    Thornton Park , MD 04/14/2019, 9:00 AM

## 2019-04-14 NOTE — Progress Notes (Signed)
Physical Therapy Treatment Patient Details Name: Victor Wise MRN: [id number] DOB: [birth year] Today's Date: 04/14/2019    History of Present Illness 68 yo male was readmitted from SNF with a gradually declining situation on L ankle, with wound dehiscence 7 weeks post op, after going to a walking boot.  Wound declined, then was taken to surgery on 11/27, with I and D, removal of L fibular hardware and exchange of wound vac.  Pt is NWB and referred to PT to resume mobility.  PMHx:  pacemaker, CAD, stent, DM, depression, GERD, HTN    PT Comments    Pt in bed upon arrival and agreeable to PT. Pt reporting pain at 6/10 at rest up to 10/10 with movement including PROM from PT. Pt performed supine LE therex requiring cuing and frequent encouragement for proper performance. Pt very reluctant to move secondary to increased pain.  Pt refusing to attempt getting EOB stating he is in too much pain to try. Pts bed moved to chair position for more upright positioning. Pt educated on importance of mobility and getting out of bed. Pt tolerating chair position with VSS however still reporting increased pain. Pt tolerated chair position for ~5 min and left in chair position end of session with nursing notified. Pt encouraged to sit up for at least a little while for more upright positioning as opposed to staying supine in bed. Pt would benefit from cont skilled acute PT to further increased functional mobility and decrease caregiver burden.   Follow Up Recommendations  SNF     Equipment Recommendations  None recommended by PT    Recommendations for Other Services       Precautions / Restrictions Precautions Precautions: Fall Restrictions Weight Bearing Restrictions: Yes LLE Weight Bearing: Non weight bearing    Mobility  Bed Mobility Overal bed mobility: Modified Independent                Transfers                 General transfer comment: declined to get fully up to side of  bed  Ambulation/Gait             General Gait Details: unable today, pt refusing   Stairs             Wheelchair Mobility    Modified Rankin (Stroke Patients Only)       Balance                                            Cognition Arousal/Alertness: Lethargic Behavior During Therapy: Flat affect Overall Cognitive Status: Within Functional Limits for tasks assessed                                 General Comments: pt is not willing to try to sit up on side of bed      Exercises Total Joint Exercises Quad Sets: AROM;Left;10 reps Gluteal Sets: AROM;Both;10 reps Towel Squeeze: AROM;Both;10 reps    General Comments        Pertinent Vitals/Pain Faces Pain Scale: Hurts worst Pain Location: L ankle Pain Descriptors / Indicators: Operative site guarding;Grimacing;Aching;Moaning Pain Intervention(s): Limited activity within patient's tolerance;Monitored during session;Repositioned    Home Living  Prior Function            PT Goals (current goals can now be found in the care plan section) Progress towards PT goals: Progressing toward goals(slowly, limited by pt participation in therapy)    Frequency    Min 2X/week      PT Plan Current plan remains appropriate    Co-evaluation              AM-PAC PT "6 Clicks" Mobility   Outcome Measure  Help needed turning from your back to your side while in a flat bed without using bedrails?: A Little Help needed moving from lying on your back to sitting on the side of a flat bed without using bedrails?: A Little Help needed moving to and from a bed to a chair (including a wheelchair)?: A Lot Help needed standing up from a chair using your arms (e.g., wheelchair or bedside chair)?: A Lot Help needed to walk in hospital room?: Total Help needed climbing 3-5 steps with a railing? : Total 6 Click Score: 12    End of Session Equipment  Utilized During Treatment: Oxygen Activity Tolerance: Patient limited by fatigue;Patient limited by pain Patient left: in bed;with call bell/phone within reach(bed in chair position) Nurse Communication: Mobility status;Patient requests pain meds PT Visit Diagnosis: Muscle weakness (generalized) (M62.81);Pain Pain - Right/Left: Left Pain - part of body: Leg     Time: [id number] PT Time Calculation (min) (ACUTE ONLY): 22 min  Charges:  $Therapeutic Activity: 8-22 mins                     Zachary George PT, DPT 10:43 AM,04/14/19 [phone]    Katrina Brosh Drucilla Chalet 04/14/2019, 10:39 AM

## 2019-04-14 NOTE — Consult Note (Addendum)
Pharmacy Antibiotic Note  Victor Wise is a 68 y.o. male admitted on 04/10/2019 with left ankle osteomyelitis with hardware infection.  Pharmacy was consulted for Vancomycin dosing. The patient initially reported an allergic reaction when therapy began, however, it was determined that this was related to the infusion rate. Notably, patient has been on Vancomycin with no prior issues related to that event - will continue to monitor. This is day #4 of IV antibiotics. According to ID he will need 6 weeks of IV antibiotics. His renal function has been stable with no leucocytosis or recent fevers.  Plan: 1) Vp: 30, Vt: 18 Using the 2 level pharmacokinetics calculator, will decrease and adjust dose to: Vancomycin 2g Q24 hours to start 11/30@0400 . Calculated AUC: 477.2 mcg*h/mL Calculated Cmin: 8.8 mcg/mL   2) continue ceftriaxone 2g q24h   Height: 5\' 8"  (172.7 cm) Weight: 270 lb (122.5 kg) IBW/kg (Calculated) : 68.4  Temp (24hrs), Avg:97.9 F (36.6 C), Min:97.6 F (36.4 C), Max:98.1 F (36.7 C)  Recent Labs  Lab 04/10/19 0917 04/11/19 0551 04/12/19 0618 04/13/19 0544 04/14/19 0729 04/14/19 1522  WBC 6.8 5.1  --  4.4 3.6*  --   CREATININE 0.87 0.66 0.51* 0.70 0.91  --   VANCOTROUGH  --   --   --   --   --  18  VANCOPEAK  --   --   --   --  30  --     Estimated Creatinine Clearance: 100.3 mL/min (by C-G formula based on SCr of 0.91 mg/dL).    Allergies  Allergen Reactions  . Amoxicillin Other (See Comments)    Has never taken amoxicillin -ENT told him not to take (? Had skin test) Has patient had a PCN reaction causing immediate rash, facial/tongue/throat swelling, SOB or lightheadedness with hypotension: No Has patient had a PCN reaction causing severe rash involving mucus membranes or skin necrosis: No Has patient had a PCN reaction that required hospitalization No Has patient had a PCN reaction occurring within the last 10 years: No If above answers are "NO", then may  proceed w/ Cephalo  . Other Anaphylaxis, Itching and Other (See Comments)    Pt states that he is allergic to Endopa.  Reaction:  Anaphylaxis  Pt states that he is allergic to Metabisulfites Reaction:  Itching   . Penicillins Other (See Comments)    Arm turned "blue" at injection site (no treatment needed) Has patient had a PCN reaction causing immediate rash, facial/tongue/throat swelling, SOB or lightheadedness with hypotension: No Has patient had a PCN reaction causing severe rash involving mucus membranes or skin necrosis: No Has patient had a PCN reaction that required hospitalization No Has patient had a PCN reaction occurring within the last 10 years: No If all of the above answers are "NO", then may proceed with Cephalosporin use.  [name] Hydralazine Other (See Comments)    Reaction:  Cramping of extremities Pt reports tolerating low dose 12.5mg  but no higher.  . Cephalexin Itching  . Clindamycin Itching  . Crestor [Rosuvastatin Calcium] Other (See Comments)    Reaction:  Pt is unable to move arms/legs   . Metoprolol Nausea And Vomiting  . Red Dye Itching  . Statins Other (See Comments)    Reaction:  Pt is unable to move arms/legs  . Sulfa Antibiotics Itching    Other reaction(s): Unknown  . Yellow Dyes (Non-Tartrazine) Itching  . Aspirin Itching and Other (See Comments)    Pt states that he is able to  use in lower doses.    . Tape Rash    Please use "paper" tape only.    Antimicrobials this admission: 11/25 vancomycin >>  11/26 levofloxacin >>11/26 11/27 Ceftriaxone >>  Microbiology results: 11/25 UCx: E coli (ceftriaxone sensitive/cipro resistant) 11/23 SARS CoV-2: negative   Thank you for allowing pharmacy to be a part of this patient's care.  Pearla Dubonnet, PharmD Clinical Pharmacist 04/14/2019 3:56 PM

## 2019-04-15 ENCOUNTER — Encounter
Admission: EM | Disposition: A | Discharge status: Other Acute Inpatient Hospital | Payer: Self-pay | Source: Home / Self Care | Attending: Orthopedic Surgery

## 2019-04-15 ENCOUNTER — Telehealth: Payer: Self-pay | Admitting: Plastic Surgery

## 2019-04-15 ENCOUNTER — Inpatient Hospital Stay: Payer: Self-pay

## 2019-04-15 ENCOUNTER — Inpatient Hospital Stay: Admission: RE | Admit: 2019-04-15 | Payer: Medicare HMO | Source: Home / Self Care | Admitting: Plastic Surgery

## 2019-04-15 ENCOUNTER — Encounter: Payer: Self-pay | Admitting: Orthopedic Surgery

## 2019-04-15 DIAGNOSIS — D649 Anemia, unspecified: Secondary | ICD-10-CM | POA: Diagnosis not present

## 2019-04-15 DIAGNOSIS — M86172 Other acute osteomyelitis, left ankle and foot: Secondary | ICD-10-CM | POA: Diagnosis not present

## 2019-04-15 DIAGNOSIS — N400 Enlarged prostate without lower urinary tract symptoms: Secondary | ICD-10-CM | POA: Diagnosis not present

## 2019-04-15 DIAGNOSIS — T847XXD Infection and inflammatory reaction due to other internal orthopedic prosthetic devices, implants and grafts, subsequent encounter: Secondary | ICD-10-CM | POA: Diagnosis not present

## 2019-04-15 LAB — GLUCOSE, CAPILLARY
Glucose-Capillary: 160 mg/dL — ABNORMAL HIGH (ref 70–99)
Glucose-Capillary: 173 mg/dL — ABNORMAL HIGH (ref 70–99)
Glucose-Capillary: 182 mg/dL — ABNORMAL HIGH (ref 70–99)
Glucose-Capillary: 279 mg/dL — ABNORMAL HIGH (ref 70–99)

## 2019-04-15 LAB — HEMOGLOBIN: Hemoglobin: 7.9 g/dL — ABNORMAL LOW (ref 13.0–17.0)

## 2019-04-15 LAB — CREATININE, SERUM
Creatinine, Ser: 0.93 mg/dL (ref 0.61–1.24)
GFR calc Af Amer: >60 mL/min (ref >60)
GFR calc non Af Amer: >60 mL/min (ref >60)

## 2019-04-15 SURGERY — IRRIGATION AND DEBRIDEMENT EXTREMITY
Anesthesia: General | Laterality: Left

## 2019-04-15 MED ORDER — MUPIROCIN 2 % EX OINT
1.0000 "application " | TOPICAL_OINTMENT | Freq: Two times a day (BID) | CUTANEOUS | Status: AC
  Administered 2019-04-15 – 2019-04-19 (×9): 1 via NASAL
  Filled 2019-04-15: qty 22

## 2019-04-15 MED ORDER — PREGABALIN 75 MG PO CAPS
200.0000 mg | ORAL_CAPSULE | Freq: Every day | ORAL | Status: DC
  Administered 2019-04-15 – 2019-04-22 (×8): 200 mg via ORAL
  Filled 2019-04-15 (×8): qty 2

## 2019-04-15 MED ORDER — ACETAMINOPHEN 325 MG PO TABS: 650.0000 mg | ORAL_TABLET | Freq: Four times a day (QID) | ORAL | Status: DC | PRN

## 2019-04-15 MED ORDER — CEFAZOLIN SODIUM-DEXTROSE 2-4 GM/100ML-% IV SOLN
2.0000 g | Freq: Three times a day (TID) | INTRAVENOUS | Status: DC
  Administered 2019-04-16 – 2019-04-23 (×22): 2 g via INTRAVENOUS
  Filled 2019-04-15 (×26): qty 100

## 2019-04-15 MED ORDER — PREGABALIN 75 MG PO CAPS
400.0000 mg | ORAL_CAPSULE | Freq: Every day | ORAL | Status: DC
  Administered 2019-04-17 – 2019-04-23 (×7): 400 mg via ORAL
  Filled 2019-04-15 (×7): qty 5

## 2019-04-15 MED ORDER — CHLORHEXIDINE GLUCONATE CLOTH 2 % EX PADS
6.0000 | MEDICATED_PAD | Freq: Every day | CUTANEOUS | Status: DC
  Administered 2019-04-15 – 2019-04-17 (×3): 6 via TOPICAL

## 2019-04-15 MED ORDER — INSULIN DETEMIR 100 UNIT/ML ~~LOC~~ SOLN
48.0000 [IU] | Freq: Every day | SUBCUTANEOUS | Status: DC
  Administered 2019-04-15 – 2019-04-22 (×7): 48 [IU] via SUBCUTANEOUS
  Filled 2019-04-15 (×10): qty 0.48

## 2019-04-15 NOTE — Progress Notes (Signed)
Patient noted to have xl stool , soiled bed and ace wrap on LLE, Reinforced with new ace. Notified Dr. Mack Guise

## 2019-04-15 NOTE — Consult Note (Addendum)
[facility] Nurse Consult Note: Reason for Consult: Ortho service is following for left ankle post-op wound; Vac is intact with a splint to LLE.   WOC consult requested for right heel wound.   Wound type: 2X2cm dark brown dry callous removes easily when cleansed with skin prep wipe, revealing pink dry intact skin.  No further need for topical treatment.   Please re-consult if further assistance is needed.  Thank-you,  Julien Girt MSN, [place], [place], [place], [place]

## 2019-04-15 NOTE — Progress Notes (Signed)
Subjective:  POD #3 s/p I&D, removal of hardware and VAC placement.   Patient reports left ankle pain as moderate.  RN reports patient with foul-smelling diarrhea earlier today.  Objective:   VITALS:   Vitals:   04/14/19 0820 04/14/19 1716 04/15/19 0020 04/15/19 0743  BP: (!) 150/70 124/72 132/87 (!) 160/87  Pulse: (!) 59 60 62 60  Resp: 17 17 19 20   Temp: 97.9 F (36.6 C) 97.9 F (36.6 C) 97.8 F (36.6 C) 98 F (36.7 C)  TempSrc: Oral Oral Oral   SpO2: 100% 99% 99% 98%  Weight:      Height:        PHYSICAL EXAM: Left lower extremity: VAC dressing in place and functioning.  Patient can flex and extend his toes on the left foot.  Splint remains clean and dry. Neurovascular intact Sensation intact distally Compartment soft  LABS  Results for orders placed or performed during the hospital encounter of 04/10/19 (from the past 24 hour(s))  Vancomycin, trough     Status: None   Collection Time: 04/14/19  3:22 PM  Result Value Ref Range   Vancomycin Tr 18 15 - 20 ug/mL  Glucose, capillary     Status: Abnormal   Collection Time: 04/14/19  5:15 PM  Result Value Ref Range   Glucose-Capillary 184 (H) 70 - 99 mg/dL   Comment 1 Notify RN   Glucose, capillary     Status: Abnormal   Collection Time: 04/14/19  9:38 PM  Result Value Ref Range   Glucose-Capillary 213 (H) 70 - 99 mg/dL   Comment 1 Notify RN   Creatinine, serum     Status: None   Collection Time: 04/15/19  4:55 AM  Result Value Ref Range   Creatinine, Ser 0.93 0.61 - 1.24 mg/dL   GFR calc non Af Amer >60 >60 mL/min   GFR calc Af Amer >60 >60 mL/min  Hemoglobin     Status: Abnormal   Collection Time: 04/15/19  4:55 AM  Result Value Ref Range   Hemoglobin 7.9 (L) 13.0 - 17.0 g/dL  Glucose, capillary     Status: Abnormal   Collection Time: 04/15/19  7:42 AM  Result Value Ref Range   Glucose-Capillary 160 (H) 70 - 99 mg/dL  Glucose, capillary     Status: Abnormal   Collection Time: 04/15/19  9:31 AM  Result  Value Ref Range   Glucose-Capillary 173 (H) 70 - 99 mg/dL  Glucose, capillary     Status: Abnormal   Collection Time: 04/15/19 12:00 PM  Result Value Ref Range   Glucose-Capillary 182 (H) 70 - 99 mg/dL    [place] Ekg Site Rite  Result Date: 04/15/2019 If Site Rite image not attached, placement could not be confirmed due to current cardiac rhythm.   Assessment/Plan: 3 Days Post-Op   Principal Problem:   Infected hardware in left lower extremity (HCC) Active Problems:   Mixed hyperlipidemia   Essential hypertension   OSA on CPAP   Benign essential hypertension   Coronary artery disease due to lipid rich plaque   Type 2 diabetes mellitus with hyperlipidemia (HCC)   Benign prostatic hyperplasia without lower urinary tract symptoms   Obesity, Class III, BMI 40-49.9 (morbid obesity) ([place])   Bimalleolar fracture of left ankle   Wound dehiscence, surgical   Pyogenic inflammation of bone (HCC)   Preop examination   Pressure injury of skin   Anemia   Hypokalemia   Diabetic polyneuropathy associated with type 2 diabetes mellitus (  [place])   Plan for the OR tomorrow for [facility] dressing change and possible placement of ACell.  Patient will require long-term IV antibiotics and will need placement of a PICC line before discharge.  Case manager reports that the patient is out of days to stay at a skilled nursing facility.  He will be discharged home with home services.    Th[place]ton [place] , MD 04/15/2019, 2:56 PM

## 2019-04-15 NOTE — Evaluation (Addendum)
Occupational Therapy Evaluation Patient Details Name: Victor Wise MRN: [id number] DOB: 16-Jan-1951 Today's Date: 04/15/2019    History of Present Illness Pt. is a 68 yo male who was readmitted from SNF with a gradually declining situation on L ankle, with wound dehiscence 7 weeks post op, after going to a walking boot.  Wound declined, then was taken to surgery on 11/27, with I and D, removal of L fibular hardware and exchange of wound vac.  Pt is NWB.  PMHx:  pacemaker, CAD, stent, DM, depression, GERD, HTN   Clinical Impression   Pt. presents with weakness, 9/10 left ankle pain, LLE NWBing, and limited functional mobility which hinder his ability to complete ADLs, and IADL tasks. Pt. Resides at home alone. Pt. Resides in an accessible 1st floor apartment. Pt. was independent with ADLs, and IADL functioning, however does not drive. Pt. Received meals on wheels. Pt. was admitted from SNF where he was receiving therapy services following his initial hospitalization for his ankle. Pt. education was provided about A/E use for LE ADLs. Pt. reports having a reacher, and a long handled sponge. Pt. Could benefit from OT services for ADL training, continued A/E training, and pt. education about home modification, and DME. Pt. would benefit from SNF level of care upon discharge with follow-up OT services.    Follow Up Recommendations  SNF    Equipment Recommendations       Recommendations for Other Services       Precautions / Restrictions Precautions Precautions: Fall Precaution Comments: LLE NWB with splint Restrictions Weight Bearing Restrictions: Yes LLE Weight Bearing: Non weight bearing      Mobility Bed Mobility               General bed mobility comments: Pt. seen at bed level.  Transfers                 General transfer comment: Deferred    Balance                                           ADL either performed or assessed with clinical  judgement   ADL Overall ADL's : Needs assistance/impaired Eating/Feeding: Set up;Independent;Bed level   Grooming: Set up;Independent;Bed level   Upper Body Bathing: Set up;Minimal assistance   Lower Body Bathing: Set up;Maximal assistance   Upper Body Dressing : Set up;Minimal assistance   Lower Body Dressing: Set up;Maximal assistance                       Vision Patient Visual Report: No change from baseline       Perception     Praxis      Pertinent Vitals/Pain Pain Assessment: 0-10 Pain Score: 9  Pain Location: Left Ankle Pain Intervention(s): Premedicated before session;Monitored during session;Limited activity within patient's tolerance;Repositioned     Hand Dominance Right   Extremity/Trunk Assessment Upper Extremity Assessment Upper Extremity Assessment: Generalized weakness           Communication Communication Communication: No difficulties   Cognition Arousal/Alertness: Awake/alert Behavior During Therapy: Flat affect Overall Cognitive Status: Within Functional Limits for tasks assessed                                     General Comments  Exercises     Shoulder Instructions      Home Living Family/patient expects to be discharged to:: Skilled nursing facility   Available Help at Discharge: Available PRN/intermittently;Friend(s);Family Type of Home: Apartment Home Access: Level entry     Home Layout: One level     Bathroom Shower/Tub: Walk-in shower;Curtain         Home Equipment: Environmental consultant - 2 wheels;Walker - 4 wheels;Shower seat          Prior Functioning/Environment Level of Independence: Independent with assistive device(s)        Comments: Pt. was admitted from SNF level of care. Pt. was independent with ADLs, IADLs. Pt. receives meals on wheels, and does not drive.        OT Problem List: Decreased strength;Decreased knowledge of use of DME or AE;Decreased activity tolerance;Impaired UE  functional use;Decreased range of motion      OT Treatment/Interventions: Self-care/ADL training;Therapeutic exercise;Patient/family education;Neuromuscular education;DME and/or AE instruction;Therapeutic activities    OT Goals(Current goals can be found in the care plan section) Acute Rehab OT Goals Patient Stated Goal: To regain independence OT Goal Formulation: With patient Potential to Achieve Goals: Good  OT Frequency: Min 2X/week   Barriers to D/C:            Co-evaluation              AM-PAC OT "6 Clicks" Daily Activity     Outcome Measure Help from another person eating meals?: None Help from another person taking care of personal grooming?: A Little Help from another person toileting, which includes using toliet, bedpan, or urinal?: A Lot Help from another person bathing (including washing, rinsing, drying)?: A Lot Help from another person to put on and taking off regular upper body clothing?: A Little Help from another person to put on and taking off regular lower body clothing?: A Lot 6 Click Score: 16   End of Session    Activity Tolerance: Patient limited by pain Patient left: in bed  OT Visit Diagnosis: Muscle weakness (generalized) (M62.81)                Time: [id number] OT Time Calculation (min): 15 min Charges:  OT General Charges $OT Visit: 1 Visit OT Evaluation $OT Eval Low Complexity: 1 Low Harrel Carina, MS, OTR/L Harrel Carina 04/15/2019, 10:27 AM

## 2019-04-15 NOTE — TOC Progression Note (Addendum)
Transition of Care [facility]) - Progression Note    Patient Details  Name: MITCHAL LAUTH MRN: [id number] Date of Birth: 10-15-1950  [facility] of [facility] CM/SW Contact  Su Hilt, RN Phone Number: 04/15/2019, 2:12 PM  Clinical Narrative:     Helene Kelp with [facility] is unable to accept the patient due to nursing staff, [place] with [facility] agreed to accept the patient with a start of care for Thursday Attempted to reach the daughter without success, no VM to leave a VM  Expected Discharge Plan: [place] Barriers to Discharge: Continued Medical Work up  Expected Discharge Plan and Services Expected Discharge Plan: [place]   Discharge Planning Services: CM Consult   Living arrangements for the past 2 months: [place]                 DME Arranged: N/A         HH Arranged: RN, PT, OT, Nurse's Aide, Social Work [facility] Agency: [facility] Date [place]: 04/15/19 Time [place]: 4 Representative spoke with at [place]: [place] ([place]) Interventions    Readmission Risk Interventions No flowsheet data found.

## 2019-04-15 NOTE — Progress Notes (Signed)
Spoke with Ander Purpura, RN concerning PICC placement. Plans for discharge Thursday, Dec. 3. PICC will not be placed today. PICC will most likely be placed tomorrow, Tuesday, Dec. 1.

## 2019-04-15 NOTE — TOC Initial Note (Signed)
Transition of Care [facility]) - Initial/Assessment Note    Patient Details  Name: Victor Wise MRN: [id number] Date of Birth: May 08, 1951  Transition of Care [facility]) CM/SW Contact:    Su Hilt, RN Phone Number: 04/15/2019, 12:23 PM  Clinical Narrative  Spoke to the patient to discuss DC plan and needs He stated that he would be going to [facility] that they were holding a bed for him I called Tina at [facility] to confirm, Otila Kluver stated that they were actually not willing to accept the patient back due to he is almost out of bed days, he refuses to apply for Medicaid and refuses to pay any money to [facility] for the predictive PML. I called the patient back and discussed with him, he said he is not paying anyone that he would just go home with home health as he has in the past. I asked him who would manage the IV ABX, he said that he would do it himself, He does want [facility] as he has used them in the past, I notifiedTeresa with [facility] of the need/I notified Pam with [facility] about the need for IV ABX. He stated that his daughter and son in law provide transportation and he can afford his medications    Expected Discharge Plan: [place] Barriers to Discharge: Continued Medical Work up   Patient Goals and CMS Choice Patient states their goals for this hospitalization and ongoing recovery are:: get well      Expected Discharge Plan and Services Expected Discharge Plan: [place]   Discharge Planning Services: CM Consult   Living arrangements for the past 2 months: [place]                 DME Arranged: N/A         HH Arranged: RN, PT, OT, Nurse's Aide, Social Work [employer] Agency: [facility] [employer] [facility] [employer] Date [place]: 04/15/19 Time [place]: 1223 Representative spoke with at [place]: [place] Arrangements/Services Living arrangements for the past 2  months: [place] Lives with:: Self Patient language and need for interpreter reviewed:: Yes Do you feel safe going back to the place where you live?: Yes      Need for Family Participation in Patient Care: No (Comment) Care giver support system in place?: Yes (comment) Current home services: DME(RW, WC, hand held shower, roll in shower, grab bars, raised toilet, BSC, Rolator, cane) Criminal Activity/Legal Involvement Pertinent to Current Situation/Hospitalization: No - Comment as needed  Activities of Daily Living Home Assistive Devices/Equipment: Wheelchair ADL Screening (condition at time of admission) Patient's cognitive ability adequate to safely complete daily activities?: Yes Is the patient deaf or have difficulty hearing?: No Does the patient have difficulty seeing, even when wearing glasses/contacts?: No Does the patient have difficulty concentrating, remembering, or making decisions?: No Patient able to express need for assistance with ADLs?: Yes Does the patient have difficulty dressing or bathing?: Yes Independently performs ADLs?: No Communication: Independent Dressing (OT): Needs assistance Is this a change from baseline?: Pre-admission baseline Grooming: Independent Feeding: Independent Bathing: Needs assistance Is this a change from baseline?: Pre-admission baseline Toileting: Needs assistance Is this a change from baseline?: Pre-admission baseline In/Out Bed: Needs assistance Is this a change from baseline?: Pre-admission baseline Walks in Home: Dependent Is this a change from baseline?: Pre-admission baseline Does the patient have difficulty walking or climbing stairs?:  Yes Weakness of Legs: Both Weakness of Arms/Hands: Both  Permission Sought/Granted   Permission granted to share information with : Yes, Verbal Permission Granted              Emotional Assessment Appearance:: Appears stated age Attitude/Demeanor/Rapport: Engaged Affect  (typically observed): Appropriate Orientation: : Oriented to Self, Oriented to Place, Oriented to  Time, Oriented to Situation Alcohol / Substance Use: Not Applicable Psych Involvement: No (comment)  Admission diagnosis:  Wound infection after surgery [T81.49XA] Patient Active Problem List   Diagnosis Date Noted  . Diabetic polyneuropathy associated with type 2 diabetes mellitus ([place])   . Pressure injury of skin 04/13/2019  . Anemia   . Hypokalemia   . Preop examination   . Pyogenic inflammation of bone ([place])   . Infected hardware in left lower extremity ([name]) 04/10/2019  . Wound dehiscence, surgical 04/10/2019  . Leg wound, left, initial encounter 03/26/2019  . Bimalleolar fracture of left ankle 01/31/2019  . Bilateral leg edema 08/20/2018  . Diabetic ulcer of left foot ([place]) 04/18/2018  . Swelling of limb 04/06/2018  . Atherosclerosis of native arteries of the extremities with ulceration ([place]) 04/06/2018  . Edema of lower extremity 04/04/2018  . Diabetic foot infection ([place]) 03/01/2018  . Spinal cord stimulator status 01/12/2018  . Essential hypertension 09/05/2017  . OSA on CPAP 09/05/2017  . Uncontrolled type 2 diabetes mellitus with hyperglycemia ([place]) 08/02/2017  . Peripheral vascular disease ([place]) 08/02/2017  . Mixed hyperlipidemia 08/02/2017  . Dysuria 08/02/2017  . Obesity, Class III, BMI 40-49.9 (morbid obesity) ([place]) 08/10/2016  . Hemiparesis affecting left side as late effect of stroke ([place]) 05/09/2016  . Symptomatic bradycardia 03/21/2016  . Falls frequently 02/26/2016  . Acute renal failure (ARF) ([place]) 02/18/2016  . Hypotension 02/18/2016  . Sick sinus syndrome ([place]) 10/28/2015  . Syncopal episodes 10/26/2015  . Syncope 10/26/2015  . Adjustment disorder with mixed anxiety and depressed mood 10/20/2015  . Dysthymia 10/20/2015  . CVA (cerebral infarction) 10/19/2015  . Urinary retention 03/25/2015  . 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 Coronary artery disease  due to lipid rich plaque 12/29/2014  . Acute MI, anterolateral wall, subsequent episode of care ([place]) 12/19/2014  . SIRS (systemic inflammatory response syndrome) ([place]) 12/18/2014  . Systemic inflammatory response syndrome (sirs) of non-infectious origin without acute organ dysfunction ([place]) 12/18/2014  . Encounter for other preprocedural examination 11/10/2014  . LVH (left ventricular hypertrophy) due to hypertensive disease, with heart failure ([place]) 10/02/2014  . Benign essential hypertension 09/26/2014  . Chronic diastolic CHF (congestive heart failure), NYHA class 2 ([place]) 06/03/2014  . Pain syndrome, chronic 03/02/2014  . Type 2 diabetes mellitus with hyperlipidemia ([place]) 11/08/2013  . Chronic radicular low back pain 08/21/2013  . Right shoulder pain 04/03/2013  . Urinary tract infection 03/16/2012  . Balanoposthitis 02/13/2012  . History of urinary stone 02/13/2012  . Benign prostatic hyperplasia without lower urinary tract symptoms 02/13/2012  . 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 Redundant prepuce and phimosis 02/13/2012  . Urge incontinence 02/13/2012  . Right foot pain 01/18/2012  . Chronic, continuous use of opioids 08/22/2011   PCP:  Lavera Guise, MD Pharmacy:   [facility] [address], [place] [address] Phone: [phone] Fax: [place], [place] [address] [address] Phone: [phone] Fax: [place], [place]  [place] [address] Phone: [phone] Fax: [phone]  [place] [facility] [place], [place] [address] Phone: [phone] Fax: [phone]  [facility] [address]), [place]) [place] [address] Phone: [phone] Fax:  [phone]     Social Determinants of Health (SDOH) Interventions    Readmission Risk Interventions No flowsheet data found.

## 2019-04-15 NOTE — Progress Notes (Signed)
Patient ID: Victor Wise, male   DOB: 07/15/1950, 68 y.o.   MRN: [id number] [facility] PROGRESS NOTE  ROLDAN COXEY [id number] DOB: 12-23-1950 DOA: 04/10/2019 PCP: Lavera Guise, MD  HPI/Subjective: Patient feels okay.  Still having a lot of pain in his left ankle.  No complaints of shortness of breath.  Clarified patient's Lyrica dose he normally takes 400 in the morning 200 in the evening.  Objective: Vitals:   04/15/19 0020 04/15/19 0743  BP: 132/87 (!) 160/87  Pulse: 62 60  Resp: 19 20  Temp: 97.8 F (36.6 C) 98 F (36.7 C)  SpO2: 99% 98%    Intake/Output Summary (Last 24 hours) at 04/15/2019 1403 Last data filed at 04/15/2019 1335 Gross per 24 hour  Intake 404.57 ml  Output 1450 ml  Net -1045.43 ml   Filed Weights   04/10/19 0840  Weight: 122.5 kg    ROS: Review of Systems  Constitutional: Negative for chills and fever.  Eyes: Negative for blurred vision.  Respiratory: Negative for cough and shortness of breath.   Cardiovascular: Negative for chest pain.  Gastrointestinal: Negative for abdominal pain, constipation, diarrhea, nausea and vomiting.  Genitourinary: Negative for dysuria.  Musculoskeletal: Positive for joint pain.  Neurological: Negative for dizziness and headaches.   Exam: Physical Exam  HENT:  Nose: No mucosal edema.  Mouth/Throat: No oropharyngeal exudate or posterior oropharyngeal edema.  Eyes: Pupils are equal, round, and reactive to light. Conjunctivae, EOM and lids are normal.  Neck: No JVD present. Carotid bruit is not present. No edema present. No thyroid mass and no thyromegaly present.  Cardiovascular: S1 normal and S2 normal. Exam reveals no gallop.  No murmur heard. Pulses:      Dorsalis pedis pulses are 2+ on the left side.  Respiratory: No respiratory distress. He has no wheezes. He has no rhonchi. He has no rales.  GI: Soft. Bowel sounds are normal. There is no abdominal tenderness.  Musculoskeletal:     Right ankle: He  exhibits no swelling.     Left ankle: He exhibits no swelling.  Lymphadenopathy:    He has no cervical adenopathy.  Neurological: He is alert. No cranial nerve deficit.  Skin: Skin is warm. Nails show no clubbing.  Left lower leg and ankle wrapped.  Psychiatric: He has a normal mood and affect.      Data Reviewed: Basic Metabolic Panel: Recent Labs  Lab 04/10/19 0917 04/11/19 0551 04/12/19 0618 04/13/19 0544 04/14/19 0729 04/15/19 0455  NA 139 140 141 142  --   --   K 3.9 3.7 3.7 3.4*  --   --   CL 101 106 106 107  --   --   CO2 26 27 27 25   --   --   GLUCOSE 177* 165* 195* 156*  --   --   BUN 18 14 16 15   --   --   CREATININE 0.87 0.66 0.51* 0.70 0.91 0.93  CALCIUM 9.1 8.7* 9.0 8.6*  --   --    Liver Function Tests: Recent Labs  Lab 04/10/19 0917  AST 17  ALT 14  ALKPHOS 64  BILITOT 0.5  PROT 7.6  ALBUMIN 3.4*   CBC: Recent Labs  Lab 04/10/19 0917 04/11/19 0551 04/13/19 0544 04/14/19 0729 04/15/19 0455  WBC 6.8 5.1 4.4 3.6*  --   NEUTROABS 4.1  --   --   --   --   HGB 10.6* 9.3* 8.0* 7.9* 7.9*  HCT 33.3*  29.7* 24.9* 25.4*  --   MCV 76.7* 78.8* 76.9* 78.2*  --   PLT 393 301 241 209  --      Recent Results (from the past 240 hour(s))  SARS CORONAVIRUS 2 (TAT 6-24 HRS) Nasopharyngeal Nasopharyngeal Swab     Status: None   Collection Time: 04/08/19  1:20 PM   Specimen: Nasopharyngeal Swab  Result Value Ref Range Status   SARS Coronavirus 2 NEGATIVE NEGATIVE Final    Comment: (NOTE) SARS-CoV-2 target nucleic acids are NOT DETECTED. The SARS-CoV-2 RNA is generally detectable in upper and lower respiratory specimens during the acute phase of infection. Negative results do not preclude SARS-CoV-2 infection, do not rule out co-infections with other pathogens, and should not be used as the sole basis for treatment or other patient management decisions. Negative results must be combined with clinical observations, patient history, and epidemiological  information. The expected result is Negative. Fact Sheet for Patients: SugarRoll.be Fact Sheet for Healthcare Providers: https://www.woods-mathews.com/ This test is not yet approved or cleared by the [place] FDA and  has been authorized for detection and/or diagnosis of SARS-CoV-2 by FDA under an Emergency Use Authorization (EUA). This EUA will remain  in effect (meaning this test can be used) for the duration of the COVID-19 declaration under Section 56 4(b)(1) of the Act, 21 U.S.C. section 360bbb-3(b)(1), unless the authorization is terminated or revoked sooner. Performed at [facility], [place] [address] [place], [place] [address]   Urine culture     Status: Abnormal   Collection Time: 04/10/19 11:28 AM   Specimen: Urine, Random  Result Value Ref Range Status   Specimen Description   Final    URINE, RANDOM Performed at [facility], [address] [place], [place] [address]    Special Requests   Final    NONE Performed at [facility], [address] [place], [place] [address]    Culture >=100,000 COLONIES/mL ESCHERICHIA COLI (A)  Final   Report Status 04/12/2019 FINAL  Final   Organism ID, Bacteria ESCHERICHIA COLI (A)  Final      Susceptibility   Escherichia coli - MIC*    AMPICILLIN 16 INTERMEDIATE Intermediate     CEFAZOLIN <=4 SENSITIVE Sensitive     CEFTRIAXONE <=1 SENSITIVE Sensitive     CIPROFLOXACIN >=4 RESISTANT Resistant     GENTAMICIN <=1 SENSITIVE Sensitive     IMIPENEM <=0.25 SENSITIVE Sensitive     NITROFURANTOIN 32 SENSITIVE Sensitive     TRIMETH/SULFA <=20 SENSITIVE Sensitive     AMPICILLIN/SULBACTAM 4 SENSITIVE Sensitive     PIP/TAZO 8 SENSITIVE Sensitive     Extended ESBL NEGATIVE Sensitive     * >=100,000 COLONIES/mL ESCHERICHIA COLI  Surgical pcr screen     Status: Abnormal   Collection Time: 04/12/19  7:10 AM   Specimen: Nasal Mucosa; Nasal Swab  Result Value Ref Range  Status   MRSA, PCR NEGATIVE NEGATIVE Final   Staphylococcus aureus POSITIVE (A) NEGATIVE Final    Comment: (NOTE) The Xpert SA Assay (FDA approved for NASAL specimens in patients 10 years of age and older), is one component of a comprehensive surveillance program. It is not intended to diagnose infection nor to guide or monitor treatment. Performed at [facility], [address] [place], [place] [address]   Aerobic/Anaerobic Culture (surgical/deep wound)     Status: None (Preliminary result)   Collection Time: 04/12/19  8:46 AM   Specimen: PATH Cytology Misc. fluid; Body Fluid  Result Value Ref Range Status   Specimen Description   Final    WOUND LEFT FIBULA Performed at [facility], [address] [place], [place] [address]    Special Requests   Final    NONE Performed at [facility], [address] [place], [place] [address]    Gram Stain   Final    NO WBC SEEN NO ORGANISMS SEEN Performed at [facility], [place] [address], [place], [place] [address]    Culture   Final    FEW STAPHYLOCOCCUS AUREUS NO ANAEROBES ISOLATED; CULTURE IN PROGRESS FOR 5 DAYS    Report Status PENDING  Incomplete   Organism ID, Bacteria STAPHYLOCOCCUS AUREUS  Final      Susceptibility   Staphylococcus aureus - MIC*    CIPROFLOXACIN >=8 RESISTANT Resistant     ERYTHROMYCIN <=0.25 SENSITIVE Sensitive     GENTAMICIN <=0.5 SENSITIVE Sensitive     OXACILLIN <=0.25 SENSITIVE Sensitive     TETRACYCLINE <=1 SENSITIVE Sensitive     VANCOMYCIN <=0.5 SENSITIVE Sensitive     TRIMETH/SULFA <=10 SENSITIVE Sensitive     CLINDAMYCIN <=0.25 SENSITIVE Sensitive     RIFAMPIN <=0.5 SENSITIVE Sensitive     Inducible Clindamycin NEGATIVE Sensitive     * FEW STAPHYLOCOCCUS AUREUS  Aerobic/Anaerobic Culture (surgical/deep wound)     Status: None (Preliminary result)   Collection Time: 04/12/19  9:01 AM   Specimen: PATH Cytology Misc. fluid; Body Fluid  Result Value Ref Range  Status   Specimen Description   Final    WOUND Performed at [facility], [address], [place], [place] [address]    Special Requests   Final    LEFT ANKLE JOINT FLUID ASPIRATION Performed at [facility], [address] [place], [place] [address]    Gram Stain   Final    NO WBC SEEN NO ORGANISMS SEEN Performed at [facility], [place] [address] [place], [place] [address]    Culture   Final    RARE STAPHYLOCOCCUS AUREUS NO ANAEROBES ISOLATED; CULTURE IN PROGRESS FOR 5 DAYS    Report Status PENDING  Incomplete   Organism ID, Bacteria STAPHYLOCOCCUS AUREUS  Final      Susceptibility   Staphylococcus aureus - MIC*    CIPROFLOXACIN >=8 RESISTANT Resistant     ERYTHROMYCIN <=0.25 SENSITIVE Sensitive     GENTAMICIN <=0.5 SENSITIVE Sensitive     OXACILLIN <=0.25 SENSITIVE Sensitive     TETRACYCLINE <=1 SENSITIVE Sensitive     VANCOMYCIN <=0.5 SENSITIVE Sensitive     TRIMETH/SULFA <=10 SENSITIVE Sensitive     CLINDAMYCIN <=0.25 SENSITIVE Sensitive     RIFAMPIN <=0.5 SENSITIVE Sensitive     Inducible Clindamycin NEGATIVE Sensitive     * RARE STAPHYLOCOCCUS AUREUS  Aerobic/Anaerobic Culture (surgical/deep wound)     Status: None (Preliminary result)   Collection Time: 04/12/19  9:01 AM   Specimen: PATH Soft tissue  Result Value Ref Range Status   Specimen Description   Final    TISSUE Performed at [facility], [place] [address], [place], [place] [address]    Special Requests   Final    WOUND TISSUE LEFT ANKLE Performed at [facility], [place], [place] [address]    Gram Stain   Final    MODERATE WBC PRESENT, PREDOMINANTLY PMN RARE GRAM POSITIVE COCCI Performed at [facility], [place] [address], [place], [place] [address]    Culture  Final    FEW STAPHYLOCOCCUS AUREUS NO ANAEROBES ISOLATED; CULTURE IN PROGRESS FOR 5 DAYS    Report Status PENDING  Incomplete   Organism ID, Bacteria STAPHYLOCOCCUS AUREUS  Final       Susceptibility   Staphylococcus aureus - MIC*    CIPROFLOXACIN >=8 RESISTANT Resistant     ERYTHROMYCIN <=0.25 SENSITIVE Sensitive     GENTAMICIN <=0.5 SENSITIVE Sensitive     OXACILLIN <=0.25 SENSITIVE Sensitive     TETRACYCLINE <=1 SENSITIVE Sensitive     VANCOMYCIN <=0.5 SENSITIVE Sensitive     TRIMETH/SULFA <=10 SENSITIVE Sensitive     CLINDAMYCIN <=0.25 SENSITIVE Sensitive     RIFAMPIN <=0.5 SENSITIVE Sensitive     Inducible Clindamycin NEGATIVE Sensitive     * FEW STAPHYLOCOCCUS AUREUS  Aerobic/Anaerobic Culture (surgical/deep wound)     Status: None (Preliminary result)   Collection Time: 04/12/19  9:15 AM   Specimen: PATH Cytology Misc. fluid; Body Fluid  Result Value Ref Range Status   Specimen Description   Final    WOUND Performed at [facility], [address], [place], [place] [address]    Special Requests   Final    WOUND CULTURE LEFT ANKLE Performed at [facility], [address] [address] [place], [place] [address]    Gram Stain   Final    NO WBC SEEN NO ORGANISMS SEEN Performed at [facility], [place] [address] [place], [place] [address]    Culture   Final    FEW STAPHYLOCOCCUS AUREUS NO ANAEROBES ISOLATED; CULTURE IN PROGRESS FOR 5 DAYS    Report Status PENDING  Incomplete   Organism ID, Bacteria STAPHYLOCOCCUS AUREUS  Final      Susceptibility   Staphylococcus aureus - MIC*    CIPROFLOXACIN >=8 RESISTANT Resistant     ERYTHROMYCIN <=0.25 SENSITIVE Sensitive     GENTAMICIN <=0.5 SENSITIVE Sensitive     OXACILLIN <=0.25 SENSITIVE Sensitive     TETRACYCLINE <=1 SENSITIVE Sensitive     VANCOMYCIN 1 SENSITIVE Sensitive     TRIMETH/SULFA <=10 SENSITIVE Sensitive     CLINDAMYCIN <=0.25 SENSITIVE Sensitive     RIFAMPIN <=0.5 SENSITIVE Sensitive     Inducible Clindamycin NEGATIVE Sensitive     * FEW STAPHYLOCOCCUS AUREUS       Scheduled Meds: . amLODipine  10 mg Oral Daily  . aspirin EC  81 mg Oral Daily  . benazepril  20 mg  Oral BID  . Chlorhexidine Gluconate Cloth  6 each Topical Q0600  . clopidogrel  75 mg Oral Daily  . DULoxetine  30 mg Oral QPM  . DULoxetine  60 mg Oral Daily  . enoxaparin (LOVENOX) injection  40 mg Subcutaneous Q24H  . ezetimibe  10 mg Oral Daily  . feeding supplement (PRO-STAT SUGAR FREE 64)  30 mL Oral Daily  . fenofibrate  54 mg Oral q1800  . furosemide  20 mg Oral Daily  . hydrochlorothiazide  12.5 mg Oral Daily  . insulin aspart  0-20 Units Subcutaneous TID WC  . insulin detemir  45 Units Subcutaneous QHS  . lactulose  20 g Oral See admin instructions  . linaclotide  290 mcg Oral QAC breakfast  . multivitamin with minerals  1 tablet Oral Daily  . mupirocin ointment  1 application Nasal BID  . omega-3 acid ethyl esters  1 g Oral BID  . oxcarbazepine  600 mg Oral BID  . oxyCODONE  20 mg Oral Q12H  . pantoprazole  40 mg Oral  Daily  . polyethylene glycol  17 g Oral BID  . pregabalin  200 mg Oral Daily  . [START ON 04/16/2019] pregabalin  400 mg Oral Daily  . senna  1 tablet Oral BID  . tamsulosin  0.4 mg Oral q1800  . vitamin C  500 mg Oral BID   Continuous Infusions: . [START ON 04/16/2019]  ceFAZolin (ANCEF) IV      Assessment/Plan:  1. Left ankle osteomyelitis with hardware infection.  Patient had wound dehiscence and exposed hardware. Patient currently on Ancef.  We will get PICC line.  Patient will be nonweightbearing on left lower extremity for a long period of time.  Will end up needing rehab again.  Wound VAC will be changed on Tuesday may be able to get out to rehab on Wednesday. 2. Type 2 diabetes mellitus with hyperglycemia and hyperlipidemia and prior toe amputations, peripheral neuropathy.  Increase Levemir 48 units at night. 3. Essential hypertension on amlodipine, benazepril, hydrochlorothiazide and low-dose Lasix. 4. Chronic systolic congestive heart failure with EF of 45%.  No signs of congestive heart failure currently. 5. UTI.  Urine culture on 11/25 showing  E. Coli.  Ancef would cover 6. Hypokalemia.  Replaced 7. Postoperative anemia.  Hemoglobin down stable at 7.9  Code Status:     Code Status Orders  (From admission, onward)         Start     Ordered   04/10/19 1050  Full code  Continuous     04/10/19 1058        Code Status History    Date Active Date Inactive Code Status Order ID Comments User Context   01/31/2019 1801 02/07/2019 2006 Full Code [id number]  Thornton [place], MD Inpatient   04/18/2018 1725 04/23/2018 2207 Full Code [id number]  Henreitta Leber, MD Inpatient   03/01/2018 2323 03/13/2018 2210 Full Code [id number]  Lance Coon, MD Inpatient   02/18/2016 1628 02/21/2016 1556 Full Code [id number]  Theodoro Grist, MD ED   10/26/2015 2111 11/03/2015 1616 Full Code [id number]  Vaughan Basta, MD Inpatient   10/19/2015 2200 10/21/2015 2351 Full Code [id number]  Gladstone Lighter, MD Inpatient   12/19/2014 2131 12/21/2014 1710 Full Code [id number]  Charolette Forward, MD Inpatient   12/19/2014 2123 12/19/2014 2131 Full Code [id number]  Charolette Forward, MD Inpatient   12/19/2014 1238 12/19/2014 1718 Full Code [id number]  Dionisio Orazio, MD Inpatient   12/18/2014 1836 12/19/2014 1238 Full Code [id number]  Henreitta Leber, MD Inpatient   Advance Care Planning Activity    Advance Directive Documentation     Most Recent Value  Type of Advance Directive  Healthcare Power of Attorney, Living will  Pre-existing out of facility DNR order (yellow form or pink MOST form)  -  "MOST" Form in Place?  -     Family Communication: As per orthopedic Disposition Plan: Patient will have wound VAC change in the operating room on Tuesday and potentially out to rehab on Wednesday, December 2.  Consultants:  Orthopedic surgery  Antibiotics:  Ancef  Time spent: 27 minutes  [place]

## 2019-04-15 NOTE — Care Management Important Message (Signed)
Important Message  Patient Details  Name: Victor Wise MRN: [id number] Date of Birth: 1951/01/13   Medicare Important Message Given:  Yes     Su Hilt, RN 04/15/2019, 4:08 PM

## 2019-04-16 ENCOUNTER — Inpatient Hospital Stay: Payer: Medicare HMO | Admitting: Anesthesiology

## 2019-04-16 ENCOUNTER — Encounter
Admission: EM | Disposition: A | Discharge status: Other Acute Inpatient Hospital | Payer: Self-pay | Source: Home / Self Care | Attending: Orthopedic Surgery

## 2019-04-16 DIAGNOSIS — M86072 Acute hematogenous osteomyelitis, left ankle and foot: Secondary | ICD-10-CM | POA: Diagnosis not present

## 2019-04-16 DIAGNOSIS — E1142 Type 2 diabetes mellitus with diabetic polyneuropathy: Secondary | ICD-10-CM | POA: Diagnosis not present

## 2019-04-16 DIAGNOSIS — I10 Essential (primary) hypertension: Secondary | ICD-10-CM | POA: Diagnosis not present

## 2019-04-16 DIAGNOSIS — T847XXD Infection and inflammatory reaction due to other internal orthopedic prosthetic devices, implants and grafts, subsequent encounter: Secondary | ICD-10-CM | POA: Diagnosis not present

## 2019-04-16 HISTORY — PX: APPLICATION OF WOUND VAC: SHX5189

## 2019-04-16 HISTORY — PX: APPLICATION OF A-CELL OF EXTREMITY: SHX6303

## 2019-04-16 LAB — GLUCOSE, CAPILLARY
Glucose-Capillary: 137 mg/dL — ABNORMAL HIGH (ref 70–99)
Glucose-Capillary: 127 mg/dL — ABNORMAL HIGH (ref 70–99)
Glucose-Capillary: 132 mg/dL — ABNORMAL HIGH (ref 70–99)
Glucose-Capillary: 143 mg/dL — ABNORMAL HIGH (ref 70–99)
Glucose-Capillary: 129 mg/dL — ABNORMAL HIGH (ref 70–99)

## 2019-04-16 LAB — BASIC METABOLIC PANEL
Anion gap: 7 (ref 5–15)
BUN: 18 mg/dL (ref 8–23)
CO2: 28 mmol/L (ref 22–32)
Calcium: 8.7 mg/dL — ABNORMAL LOW (ref 8.9–10.3)
Chloride: 105 mmol/L (ref 98–111)
Creatinine, Ser: 0.84 mg/dL (ref 0.61–1.24)
GFR calc Af Amer: >60 mL/min (ref >60)
GFR calc non Af Amer: >60 mL/min (ref >60)
Glucose, Bld: 212 mg/dL — ABNORMAL HIGH (ref 70–99)
Potassium: 3.4 mmol/L — ABNORMAL LOW (ref 3.5–5.1)
Sodium: 140 mmol/L (ref 135–145)

## 2019-04-16 LAB — SARS CORONAVIRUS 2 (TAT 6-24 HRS): SARS Coronavirus 2: NEGATIVE

## 2019-04-16 LAB — HEMOGLOBIN: Hemoglobin: 8 g/dL — ABNORMAL LOW (ref 13.0–17.0)

## 2019-04-16 SURGERY — APPLICATION, WOUND VAC
Anesthesia: General | Site: Ankle | Laterality: Left

## 2019-04-16 MED ORDER — FENTANYL CITRATE (PF) 100 MCG/2ML IJ SOLN
INTRAMUSCULAR | Status: AC
  Administered 2019-04-16: 25 ug via INTRAVENOUS
  Filled 2019-04-16: qty 2

## 2019-04-16 MED ORDER — NEOMYCIN-POLYMYXIN B GU 40-200000 IR SOLN
Status: DC | PRN
  Administered 2019-04-16: 16 mL

## 2019-04-16 MED ORDER — PROPOFOL 10 MG/ML IV BOLUS
INTRAVENOUS | Status: DC | PRN
  Administered 2019-04-16: 30 mg via INTRAVENOUS

## 2019-04-16 MED ORDER — FENTANYL CITRATE (PF) 100 MCG/2ML IJ SOLN
INTRAMUSCULAR | Status: AC
  Administered 2019-04-16: 17:00:00 25 ug via INTRAVENOUS
  Filled 2019-04-16: qty 2

## 2019-04-16 MED ORDER — ONDANSETRON HCL 4 MG/2ML IJ SOLN: 4.0000 mg | Freq: Once | INTRAMUSCULAR | Status: DC | PRN

## 2019-04-16 MED ORDER — POTASSIUM CHLORIDE CRYS ER 20 MEQ PO TBCR
40.0000 meq | EXTENDED_RELEASE_TABLET | Freq: Once | ORAL | Status: AC
  Administered 2019-04-16: 08:00:00 40 meq via ORAL
  Filled 2019-04-16: qty 2

## 2019-04-16 MED ORDER — PROPOFOL 500 MG/50ML IV EMUL
INTRAVENOUS | Status: DC | PRN
  Administered 2019-04-16: 75 ug/kg/min via INTRAVENOUS

## 2019-04-16 MED ORDER — FENTANYL CITRATE (PF) 100 MCG/2ML IJ SOLN
25.0000 ug | INTRAMUSCULAR | Status: DC | PRN
  Administered 2019-04-16 (×5): 25 ug via INTRAVENOUS

## 2019-04-16 MED ORDER — FENTANYL CITRATE (PF) 100 MCG/2ML IJ SOLN
INTRAMUSCULAR | Status: DC | PRN
  Administered 2019-04-16 (×4): 25 ug via INTRAVENOUS

## 2019-04-16 MED ORDER — SODIUM CHLORIDE 0.9% FLUSH: 10.0000 mL | INTRAVENOUS | Status: DC | PRN

## 2019-04-16 MED ORDER — FENTANYL CITRATE (PF) 100 MCG/2ML IJ SOLN
25.0000 ug | INTRAMUSCULAR | Status: DC | PRN
  Administered 2019-04-16: 17:00:00 25 ug via INTRAVENOUS

## 2019-04-16 MED ORDER — SODIUM CHLORIDE 0.9% FLUSH
10.0000 mL | Freq: Two times a day (BID) | INTRAVENOUS | Status: DC
  Administered 2019-04-17 – 2019-04-21 (×9): 10 mL
  Administered 2019-04-22: 30 mL
  Administered 2019-04-22: 23:00:00 10 mL

## 2019-04-16 MED ORDER — LACTATED RINGERS IV SOLN
INTRAVENOUS | Status: DC | PRN
  Administered 2019-04-16: 15:00:00 via INTRAVENOUS

## 2019-04-16 SURGICAL SUPPLY — 46 items
BLADE SURG 15 STRL LF DISP TIS (BLADE) ×2 IMPLANT
BLADE SURG 15 STRL SS (BLADE) ×2
BNDG ELASTIC 6X5.8 VLCR STR LF (GAUZE/BANDAGES/DRESSINGS) IMPLANT
BNDG ESMARK 4X12 TAN STRL LF (GAUZE/BANDAGES/DRESSINGS) IMPLANT
CANISTER SUCT 1200ML W/VALVE (MISCELLANEOUS) ×3 IMPLANT
CANISTER SUCT 3000ML PPV (MISCELLANEOUS) ×3 IMPLANT
CAST PADDING 3X4FT ST 30246 (SOFTGOODS)
COVER WAND RF STERILE (DRAPES) ×3 IMPLANT
CUFF TOURN SGL QUICK 24 (TOURNIQUET CUFF)
CUFF TOURN SGL QUICK 30 (TOURNIQUET CUFF)
CUFF TRNQT CYL 24X4X16.5-23 (TOURNIQUET CUFF) IMPLANT
CUFF TRNQT CYL 30X4X21-28X (TOURNIQUET CUFF) IMPLANT
DRAPE U-SHAPE 47X51 STRL (DRAPES) ×3 IMPLANT
DRSG EMULSION OIL 3X8 NADH (GAUZE/BANDAGES/DRESSINGS) ×3 IMPLANT
DURAPREP 26ML APPLICATOR (WOUND CARE) IMPLANT
ELECT REM PT RETURN 9FT ADLT (ELECTROSURGICAL) ×3
ELECTRODE REM PT RTRN 9FT ADLT (ELECTROSURGICAL) ×2 IMPLANT
GAUZE SPONGE 4X4 12PLY STRL (GAUZE/BANDAGES/DRESSINGS) ×3 IMPLANT
GAUZE XEROFORM 1X8 LF (GAUZE/BANDAGES/DRESSINGS) ×3 IMPLANT
GLOVE BIOGEL PI IND STRL 9 (GLOVE) ×2 IMPLANT
GLOVE BIOGEL PI INDICATOR 9 (GLOVE) ×1
GLOVE SURG 9.0 ORTHO LTXF (GLOVE) ×6 IMPLANT
GOWN STRL REUS TWL 2XL XL LVL4 (GOWN DISPOSABLE) ×3 IMPLANT
GOWN STRL REUS W/ TWL LRG LVL3 (GOWN DISPOSABLE) ×2 IMPLANT
GOWN STRL REUS W/TWL LRG LVL3 (GOWN DISPOSABLE) ×1
MAT ABSORB  FLUID 56X50 GRAY (MISCELLANEOUS)
MAT ABSORB FLUID 56X50 GRAY (MISCELLANEOUS) IMPLANT
MATRIX WOUND 3-LAYER 7X10 (Tissue) ×3 IMPLANT
MICROMATRIX 1000MG (Tissue) ×3 IMPLANT
NS IRRIG 1000ML POUR BTL (IV SOLUTION) ×3 IMPLANT
PACK EXTREMITY ARMC (MISCELLANEOUS) ×3 IMPLANT
PAD ABD DERMACEA PRESS 5X9 (GAUZE/BANDAGES/DRESSINGS) ×18 IMPLANT
PAD CAST CTTN 3X4 STRL (SOFTGOODS) IMPLANT
PAD CAST CTTN 4X4 STRL (SOFTGOODS) ×2 IMPLANT
PADDING CAST 4IN STRL (MISCELLANEOUS) ×1
PADDING CAST BLEND 4X4 STRL (MISCELLANEOUS) ×2 IMPLANT
PADDING CAST COTTON 4X4 STRL (SOFTGOODS) ×1
PULSAVAC PLUS IRRIG FAN TIP (DISPOSABLE)
SOL .9 NS 3000ML IRR  AL (IV SOLUTION)
SOL .9 NS 3000ML IRR UROMATIC (IV SOLUTION) IMPLANT
SOL PREP PROV IODINE SCRUB 4OZ (MISCELLANEOUS) ×3 IMPLANT
SOLUTION PARTIC MCRMTRX 1000MG (Tissue) ×2 IMPLANT
STOCKINETTE STRL 4IN 9604848 (GAUZE/BANDAGES/DRESSINGS) ×3 IMPLANT
SUT VIC AB 3-0 SH 27 (SUTURE) ×1
SUT VIC AB 3-0 SH 27X BRD (SUTURE) ×2 IMPLANT
TIP FAN IRRIG PULSAVAC PLUS (DISPOSABLE) IMPLANT

## 2019-04-16 NOTE — Progress Notes (Signed)
Insulin held d/t patient not agreeable to eating at this time

## 2019-04-16 NOTE — Anesthesia Post-op Follow-up Note (Signed)
Anesthesia QCDR form completed.        

## 2019-04-16 NOTE — Progress Notes (Signed)
Peripherally Inserted Central Catheter/Midline Placement  The IV Nurse has discussed with the patient and/or persons authorized to consent for the patient, the purpose of this procedure and the potential benefits and risks involved with this procedure.  The benefits include less needle sticks, lab draws from the catheter, and the patient may be discharged home with the catheter. Risks include, but not limited to, infection, bleeding, blood clot (thrombus formation), and puncture of an artery; nerve damage and irregular heartbeat and possibility to perform a PICC exchange if needed/ordered by physician.  Alternatives to this procedure were also discussed.  Bard Power PICC patient education guide, fact sheet on infection prevention and patient information card has been provided to patient /or left at bedside.    PICC/Midline Placement Documentation  PICC Single Lumen 04/16/19 PICC Right Brachial 44 cm 0 cm (Active)  Indication for Insertion or Continuance of Line Home intravenous therapies (PICC only) 04/16/19 0900  Exposed Catheter (cm) 0 cm 04/16/19 0900  Site Assessment Clean;Dry;Intact 04/16/19 0900  Line Status Flushed;Saline locked;Blood return noted 04/16/19 0900  Dressing Type Transparent;Securing device 04/16/19 0900  Dressing Status Clean;Dry;Intact;Antimicrobial disc in place 04/16/19 0900  Dressing Change Due 04/23/19 04/16/19 0900       Holley Bouche Renee 04/16/2019, 9:38 AM

## 2019-04-16 NOTE — Progress Notes (Signed)
Patient ID: Victor Wise, male   DOB: 06-27-1950, 68 y.o.   MRN: [id number] [facility] PROGRESS NOTE  Victor Wise [id number] DOB: [birth year] DOA: 04/10/2019 PCP: Lavera Guise, MD  HPI/Subjective: Patient feels okay.  Still having left ankle pain.  Had one episode of diarrhea this morning.  Objective: Vitals:   04/16/19 0037 04/16/19 1409  BP: (!) 151/72 (!) 163/78  Pulse: 62 60  Resp: 18 18  Temp: 99.1 F (37.3 C) 99.3 F (37.4 C)  SpO2: 97% 97%    Intake/Output Summary (Last 24 hours) at 04/16/2019 1425 Last data filed at 04/16/2019 1015 Gross per 24 hour  Intake 381.36 ml  Output 1915 ml  Net -1533.64 ml   Filed Weights   04/10/19 0840  Weight: 122.5 kg    ROS: Review of Systems  Constitutional: Negative for chills and fever.  Eyes: Negative for blurred vision.  Respiratory: Negative for cough and shortness of breath.   Cardiovascular: Negative for chest pain.  Gastrointestinal: Positive for diarrhea. Negative for abdominal pain, constipation, nausea and vomiting.  Genitourinary: Negative for dysuria.  Musculoskeletal: Positive for joint pain.  Neurological: Negative for dizziness and headaches.   Exam: Physical Exam  HENT:  Nose: No mucosal edema.  Mouth/Throat: No oropharyngeal exudate or posterior oropharyngeal edema.  Eyes: Pupils are equal, round, and reactive to light. Conjunctivae, EOM and lids are normal.  Neck: No JVD present. Carotid bruit is not present. No edema present. No thyroid mass and no thyromegaly present.  Cardiovascular: S1 normal and S2 normal. Exam reveals no gallop.  No murmur heard. Pulses:      Dorsalis pedis pulses are 2+ on the left side.  Respiratory: No respiratory distress. He has no wheezes. He has no rhonchi. He has no rales.  GI: Soft. Bowel sounds are normal. There is no abdominal tenderness.  Musculoskeletal:     Right ankle: He exhibits no swelling.     Left ankle: He exhibits no swelling.  Lymphadenopathy:    He has no cervical adenopathy.  Neurological: He is alert. No cranial nerve deficit.  Skin: Skin is warm. Nails show no clubbing.  Left lower leg and ankle wrapped.  Psychiatric: He has a normal mood and affect.      Data Reviewed: Basic Metabolic Panel: Recent Labs  Lab 04/10/19 0917 04/11/19 0551 04/12/19 0618 04/13/19 0544 04/14/19 0729 04/15/19 0455 04/16/19 0418  NA 139 140 141 142  --   --  140  K 3.9 3.7 3.7 3.4*  --   --  3.4*  CL 101 106 106 107  --   --  105  CO2 26 27 27 25   --   --  28  GLUCOSE 177* 165* 195* 156*  --   --  212*  BUN 18 14 16 15   --   --  18  CREATININE 0.87 0.66 0.51* 0.70 0.91 0.93 0.84  CALCIUM 9.1 8.7* 9.0 8.6*  --   --  8.7*   Liver Function Tests: Recent Labs  Lab 04/10/19 0917  AST 17  ALT 14  ALKPHOS 64  BILITOT 0.5  PROT 7.6  ALBUMIN 3.4*   CBC: Recent Labs  Lab 04/10/19 0917 04/11/19 0551 04/13/19 0544 04/14/19 0729 04/15/19 0455 04/16/19 0418  WBC 6.8 5.1 4.4 3.6*  --   --   NEUTROABS 4.1  --   --   --   --   --   HGB 10.6* 9.3* 8.0* 7.9* 7.9* 8.0*  HCT  33.3* 29.7* 24.9* 25.4*  --   --   MCV 76.7* 78.8* 76.9* 78.2*  --   --   PLT 393 301 241 209  --   --      Recent Results (from the past 240 hour(s))  SARS CORONAVIRUS 2 (TAT 6-24 HRS) Nasopharyngeal Nasopharyngeal Swab     Status: None   Collection Time: 04/08/19  1:20 PM   Specimen: Nasopharyngeal Swab  Result Value Ref Range Status   SARS Coronavirus 2 NEGATIVE NEGATIVE Final    Comment: (NOTE) SARS-CoV-2 target nucleic acids are NOT DETECTED. The SARS-CoV-2 RNA is generally detectable in upper and lower respiratory specimens during the acute phase of infection. Negative results do not preclude SARS-CoV-2 infection, do not rule out co-infections with other pathogens, and should not be used as the sole basis for treatment or other patient management decisions. Negative results must be combined with clinical observations, patient history, and  epidemiological information. The expected result is Negative. Fact Sheet for Patients: SugarRoll.be Fact Sheet for Healthcare Providers: https://www.woods-mathews.com/ This test is not yet approved or cleared by the [place] FDA and  has been authorized for detection and/or diagnosis of SARS-CoV-2 by FDA under an Emergency Use Authorization (EUA). This EUA will remain  in effect (meaning this test can be used) for the duration of the COVID-19 declaration under Section 56 4(b)(1) of the Act, 21 U.S.C. section 360bbb-3(b)(1), unless the authorization is terminated or revoked sooner. Performed at [facility], [place] [address] [place], [place] [address]   Urine culture     Status: Abnormal   Collection Time: 04/10/19 11:28 AM   Specimen: Urine, Random  Result Value Ref Range Status   Specimen Description   Final    URINE, RANDOM Performed at [facility], [address] [place], [place] [address]    Special Requests   Final    NONE Performed at [facility], [address] [place], [place] [address]    Culture >=100,000 COLONIES/mL ESCHERICHIA COLI (A)  Final   Report Status 04/12/2019 FINAL  Final   Organism ID, Bacteria ESCHERICHIA COLI (A)  Final      Susceptibility   Escherichia coli - MIC*    AMPICILLIN 16 INTERMEDIATE Intermediate     CEFAZOLIN <=4 SENSITIVE Sensitive     CEFTRIAXONE <=1 SENSITIVE Sensitive     CIPROFLOXACIN >=4 RESISTANT Resistant     GENTAMICIN <=1 SENSITIVE Sensitive     IMIPENEM <=0.25 SENSITIVE Sensitive     NITROFURANTOIN 32 SENSITIVE Sensitive     TRIMETH/SULFA <=20 SENSITIVE Sensitive     AMPICILLIN/SULBACTAM 4 SENSITIVE Sensitive     PIP/TAZO 8 SENSITIVE Sensitive     Extended ESBL NEGATIVE Sensitive     * >=100,000 COLONIES/mL ESCHERICHIA COLI  Surgical pcr screen     Status: Abnormal   Collection Time: 04/12/19  7:10 AM   Specimen: Nasal Mucosa; Nasal Swab  Result  Value Ref Range Status   MRSA, PCR NEGATIVE NEGATIVE Final   Staphylococcus aureus POSITIVE (A) NEGATIVE Final    Comment: (NOTE) The Xpert SA Assay (FDA approved for NASAL specimens in patients 41 years of age and older), is one component of a comprehensive surveillance program. It is not intended to diagnose infection nor to guide or monitor treatment. Performed at [facility], [address] [place], [place] [address]   Aerobic/Anaerobic Culture (surgical/deep wound)     Status: None (Preliminary result)   Collection Time: 04/12/19  8:46 AM  Specimen: PATH Cytology Misc. fluid; Body Fluid  Result Value Ref Range Status   Specimen Description   Final    WOUND LEFT FIBULA Performed at [facility], [address], [place], [place] [address]    Special Requests   Final    NONE Performed at [facility], [address] [place], [place] [address]    Gram Stain   Final    NO WBC SEEN NO ORGANISMS SEEN Performed at [facility], [place] [address] [place], [place] [address]    Culture   Final    FEW STAPHYLOCOCCUS AUREUS NO ANAEROBES ISOLATED; CULTURE IN PROGRESS FOR 5 DAYS    Report Status PENDING  Incomplete   Organism ID, Bacteria STAPHYLOCOCCUS AUREUS  Final      Susceptibility   Staphylococcus aureus - MIC*    CIPROFLOXACIN >=8 RESISTANT Resistant     ERYTHROMYCIN <=0.25 SENSITIVE Sensitive     GENTAMICIN <=0.5 SENSITIVE Sensitive     OXACILLIN <=0.25 SENSITIVE Sensitive     TETRACYCLINE <=1 SENSITIVE Sensitive     VANCOMYCIN <=0.5 SENSITIVE Sensitive     TRIMETH/SULFA <=10 SENSITIVE Sensitive     CLINDAMYCIN <=0.25 SENSITIVE Sensitive     RIFAMPIN <=0.5 SENSITIVE Sensitive     Inducible Clindamycin NEGATIVE Sensitive     * FEW STAPHYLOCOCCUS AUREUS  Aerobic/Anaerobic Culture (surgical/deep wound)     Status: None (Preliminary result)   Collection Time: 04/12/19  9:01 AM   Specimen: PATH Cytology Misc. fluid; Body Fluid  Result  Value Ref Range Status   Specimen Description   Final    WOUND Performed at [facility], [address], [place], [place] [address]    Special Requests   Final    LEFT ANKLE JOINT FLUID ASPIRATION Performed at [facility], [address] [place], [place] [address]    Gram Stain   Final    NO WBC SEEN NO ORGANISMS SEEN Performed at [facility], [place] [address] [place], [place] [address]    Culture   Final    RARE STAPHYLOCOCCUS AUREUS NO ANAEROBES ISOLATED; CULTURE IN PROGRESS FOR 5 DAYS    Report Status PENDING  Incomplete   Organism ID, Bacteria STAPHYLOCOCCUS AUREUS  Final      Susceptibility   Staphylococcus aureus - MIC*    CIPROFLOXACIN >=8 RESISTANT Resistant     ERYTHROMYCIN <=0.25 SENSITIVE Sensitive     GENTAMICIN <=0.5 SENSITIVE Sensitive     OXACILLIN <=0.25 SENSITIVE Sensitive     TETRACYCLINE <=1 SENSITIVE Sensitive     VANCOMYCIN <=0.5 SENSITIVE Sensitive     TRIMETH/SULFA <=10 SENSITIVE Sensitive     CLINDAMYCIN <=0.25 SENSITIVE Sensitive     RIFAMPIN <=0.5 SENSITIVE Sensitive     Inducible Clindamycin NEGATIVE Sensitive     * RARE STAPHYLOCOCCUS AUREUS  Aerobic/Anaerobic Culture (surgical/deep wound)     Status: None (Preliminary result)   Collection Time: 04/12/19  9:01 AM   Specimen: PATH Soft tissue  Result Value Ref Range Status   Specimen Description   Final    TISSUE Performed at [facility], [place] [address], [place], [place] [address]    Special Requests   Final    WOUND TISSUE LEFT ANKLE Performed at [facility], [place], [place] [address]    Gram Stain   Final    MODERATE WBC PRESENT, PREDOMINANTLY PMN RARE GRAM POSITIVE COCCI Performed at [facility], [place] [address],  [place], [place] [address]    Culture   Final    FEW STAPHYLOCOCCUS AUREUS NO ANAEROBES ISOLATED; CULTURE IN PROGRESS FOR 5 DAYS    Report Status PENDING  Incomplete   Organism ID, Bacteria STAPHYLOCOCCUS  AUREUS  Final      Susceptibility   Staphylococcus aureus - MIC*    CIPROFLOXACIN >=8 RESISTANT Resistant     ERYTHROMYCIN <=0.25 SENSITIVE Sensitive     GENTAMICIN <=0.5 SENSITIVE Sensitive     OXACILLIN <=0.25 SENSITIVE Sensitive     TETRACYCLINE <=1 SENSITIVE Sensitive     VANCOMYCIN <=0.5 SENSITIVE Sensitive     TRIMETH/SULFA <=10 SENSITIVE Sensitive     CLINDAMYCIN <=0.25 SENSITIVE Sensitive     RIFAMPIN <=0.5 SENSITIVE Sensitive     Inducible Clindamycin NEGATIVE Sensitive     * FEW STAPHYLOCOCCUS AUREUS  Aerobic/Anaerobic Culture (surgical/deep wound)     Status: None (Preliminary result)   Collection Time: 04/12/19  9:15 AM   Specimen: PATH Cytology Misc. fluid; Body Fluid  Result Value Ref Range Status   Specimen Description   Final    WOUND Performed at [facility], [address], [place], [place] [address]    Special Requests   Final    WOUND CULTURE LEFT ANKLE Performed at [facility], [address] [place], [place] [address]    Gram Stain   Final    NO WBC SEEN NO ORGANISMS SEEN Performed at [facility], [place] [address], [place], [place] [address]    Culture   Final    FEW STAPHYLOCOCCUS AUREUS NO ANAEROBES ISOLATED; CULTURE IN PROGRESS FOR 5 DAYS    Report Status PENDING  Incomplete   Organism ID, Bacteria STAPHYLOCOCCUS AUREUS  Final      Susceptibility   Staphylococcus aureus - MIC*    CIPROFLOXACIN >=8 RESISTANT Resistant     ERYTHROMYCIN <=0.25 SENSITIVE Sensitive     GENTAMICIN <=0.5 SENSITIVE Sensitive     OXACILLIN <=0.25 SENSITIVE Sensitive     TETRACYCLINE <=1 SENSITIVE Sensitive     VANCOMYCIN 1 SENSITIVE Sensitive     TRIMETH/SULFA <=10 SENSITIVE Sensitive     CLINDAMYCIN <=0.25 SENSITIVE Sensitive     RIFAMPIN <=0.5 SENSITIVE Sensitive     Inducible Clindamycin NEGATIVE Sensitive     * FEW STAPHYLOCOCCUS AUREUS       Scheduled Meds: . [MAR Hold] amLODipine  10 mg Oral Daily  . [MAR Hold] aspirin EC  81 mg  Oral Daily  . [MAR Hold] benazepril  20 mg Oral BID  . [MAR Hold] Chlorhexidine Gluconate Cloth  6 each Topical Q0600  . [MAR Hold] clopidogrel  75 mg Oral Daily  . [MAR Hold] DULoxetine  30 mg Oral QPM  . [MAR Hold] DULoxetine  60 mg Oral Daily  . [MAR Hold] ezetimibe  10 mg Oral Daily  . [MAR Hold] feeding supplement (PRO-STAT SUGAR FREE 64)  30 mL Oral Daily  . [MAR Hold] fenofibrate  54 mg Oral q1800  . [MAR Hold] furosemide  20 mg Oral Daily  . [MAR Hold] hydrochlorothiazide  12.5 mg Oral Daily  . [MAR Hold] insulin aspart  0-20 Units Subcutaneous TID WC  . [MAR Hold] insulin detemir  48 Units Subcutaneous QHS  . [MAR Hold] linaclotide  290 mcg Oral QAC breakfast  . [MAR Hold] multivitamin with minerals  1 tablet Oral Daily  . [MAR Hold] mupirocin ointment  1 application Nasal BID  . [MAR Hold] omega-3 acid ethyl esters  1 g  Oral BID  . [MAR Hold] oxcarbazepine  600 mg Oral BID  . [MAR Hold] oxyCODONE  20 mg Oral Q12H  . [MAR Hold] pantoprazole  40 mg Oral Daily  . [MAR Hold] pregabalin  200 mg Oral Daily  . [MAR Hold] pregabalin  400 mg Oral Daily  . [MAR Hold] sodium chloride flush  10-40 mL Intracatheter Q12H  . [MAR Hold] tamsulosin  0.4 mg Oral q1800  . [MAR Hold] vitamin C  500 mg Oral BID   Continuous Infusions: . [MAR Hold]  ceFAZolin (ANCEF) IV 200 mL/hr at 04/16/19 0600    Assessment/Plan:  1. Left ankle osteomyelitis with hardware infection.  Patient had wound dehiscence and exposed hardware. Patient currently on Ancef.  Length of therapy as per infectious disease.  PICC line placed today.  Patient will be nonweightbearing on left lower extremity for a long period of time.  Wound VAC will be changed in OR today.  Potentially home with home health tomorrow as per Dr. Mack Guise.  Patient will need a PICC line extender in order to give his own antibiotics at home. 2. Type 2 diabetes mellitus with hyperglycemia and hyperlipidemia and prior toe amputations, peripheral  neuropathy.  Increased Levemir 48 units at night. 3. Essential hypertension on amlodipine, benazepril, hydrochlorothiazide and low-dose Lasix. 4. Chronic systolic congestive heart failure with EF of 45%.  No signs of congestive heart failure currently. 5. UTI.  Urine culture on 11/25 showing E. Coli.  Ancef would cover 6. Hypokalemia.  Replaced 7. Postoperative anemia.  Hemoglobin down stable at 8.0 8. Hypokalemia.  Replace potassium orally.  Code Status:     Code Status Orders  (From admission, onward)         Start     Ordered   04/10/19 1050  Full code  Continuous     04/10/19 1058        Code Status History    Date Active Date Inactive Code Status Order ID Comments User Context   01/31/2019 1801 02/07/2019 2006 Full Code [id number]  Thornton Park, MD Inpatient   04/18/2018 1725 04/23/2018 2207 Full Code [id number]  Henreitta Leber, MD Inpatient   03/01/2018 2323 03/13/2018 2210 Full Code [id number]  Lance Coon, MD Inpatient   02/18/2016 1628 02/21/2016 1556 Full Code [id number]  Theodoro Grist, MD ED   10/26/2015 2111 11/03/2015 1616 Full Code [id number]  Vaughan Basta, MD Inpatient   10/19/2015 2200 10/21/2015 2351 Full Code [id number]  Gladstone Lighter, MD Inpatient   12/19/2014 2131 12/21/2014 1710 Full Code [id number]  Charolette Forward, MD Inpatient   12/19/2014 2123 12/19/2014 2131 Full Code [id number]  Charolette Forward, MD Inpatient   12/19/2014 1238 12/19/2014 1718 Full Code [id number]  Dionisio Shahan, MD Inpatient   12/18/2014 1836 12/19/2014 1238 Full Code [id number]  Henreitta Leber, MD Inpatient   Advance Care Planning Activity    Advance Directive Documentation     Most Recent Value  Type of Advance Directive  Healthcare Power of Attorney, Living will  Pre-existing out of facility DNR order (yellow form or pink MOST form)  -  "MOST" Form in Place?  -     Family Communication: As per orthopedic Disposition Plan: As per orthopedic.  Patient does not have any rehab days available  and did not want to pay the co-pay for rehab.  Plan will be home with home health.  Consultants:  Orthopedic surgery  Infectious disease  Antibiotics:  Ancef  Time spent: 26 minutes  Kabella Cassidy [employer]  [facility]

## 2019-04-16 NOTE — Anesthesia Preprocedure Evaluation (Signed)
Anesthesia Evaluation  Patient identified by MRN, date of birth, ID band Patient awake    Reviewed: Allergy & Precautions, NPO status , Patient's Chart, lab work & pertinent test results  History of Anesthesia Complications Negative for: history of anesthetic complications  Airway Mallampati: III  TM Distance: >3 FB Neck ROM: Full    Dental  (+) Poor Dentition, Missing, Chipped   Pulmonary neg shortness of breath, sleep apnea and Continuous Positive Airway Pressure Ventilation , neg COPD, neg recent URI, former smoker,    breath sounds clear to auscultation- rhonchi (-) wheezing      Cardiovascular hypertension, (-) angina+ CAD, + Past MI, + Cardiac Stents (last stent ~3 yrs ago), + Peripheral Vascular Disease and +CHF  (-) CABG (-) dysrhythmias + pacemaker (-) Valvular Problems/Murmurs Rhythm:Regular Rate:Normal - Systolic murmurs and - Diastolic murmurs    Neuro/Psych neg Seizures PSYCHIATRIC DISORDERS Depression  Neuromuscular disease CVA, No Residual Symptoms    GI/Hepatic Neg liver ROS, GERD  ,  Endo/Other  diabetes, Insulin Dependent  Renal/GU Renal disease     Musculoskeletal negative musculoskeletal ROS (+)   Abdominal (+) + obese,   Peds  Hematology negative hematology ROS (+)   Anesthesia Other Findings     Reproductive/Obstetrics                             Anesthesia Physical  Anesthesia Plan  ASA: III  Anesthesia Plan: General ETT   Post-op Pain Management:    Induction: Intravenous  PONV Risk Score and Plan: 2 and Ondansetron, Dexamethasone and Treatment may vary due to age or medical condition  Airway Management Planned: Oral ETT  Additional Equipment:   Intra-op Plan:   Post-operative Plan: Extubation in OR  Informed Consent: I have reviewed the patients History and Physical, chart, labs and discussed the procedure including the risks, benefits and  alternatives for the proposed anesthesia with the patient or authorized representative who has indicated his/her understanding and acceptance.       Plan Discussed with: CRNA and Anesthesiologist  Anesthesia Plan Comments:         Anesthesia Quick Evaluation

## 2019-04-16 NOTE — Progress Notes (Signed)
Patient returned to floor post op to LLE, wound vac intact draining red blood , Patient tolerating PO fluids and advanced diet to regular , VSS. Denies pain at this time. BS 143.

## 2019-04-16 NOTE — Progress Notes (Signed)
Patient had not been added back to system , patient attempt to call dietary and they state that he was not in system , charge nurse added him back , when this writer went in to speak patient and tell him he could now order his food he just repitiously told this writer " I'm just tired of the bullsh**" , I am just tire of the bullsh*t" ,, Left patient alone as he wished,

## 2019-04-16 NOTE — Op Note (Signed)
04/16/2019  4:23 PM  PATIENT:  Victor Wise    PRE-OPERATIVE DIAGNOSIS:  Left ankle open wound  POST-OPERATIVE DIAGNOSIS:  Same  PROCEDURE:  CHANGE OF WOUND VAC, IRRIGATION OF ANKLE WOUND, APPLICATION OF A-CELL to LEFT ANKLE WOUND  SURGEON:  Thornton Park, MD  ANESTHESIA:   General  PREOPERATIVE INDICATIONS:  ANDAN PAL is a  68 y.o. male with a diagnosis of T81.30XD Disruption of wound who failed conservative measures and elected for surgical management.    I discussed the risks and benefits of surgery. The risks include but are not limited to infection, bleeding requiring blood transfusion, nerve or blood vessel injury, joint stiffness or loss of motion, persistent pain, weakness or instability, malunion, nonunion and hardware failure and the need for further surgery. Medical risks include but are not limited to DVT and pulmonary embolism, myocardial infarction, stroke, pneumonia, respiratory failure and death. Patient understood these risks and wished to proceed.    OPERATIVE IMPLANTS: ACell 1000mg  MicroMatrix, 7x10 layer Cytal,  Medela wound vac  SPECIMEN:  SWAB FOR WOUND CULTURE  OPERATIVE FINDINGS: Left ankle wound without necrotic tissue.  OPERATIVE PROCEDURE: Patient was brought to the operating room.  He was given propofol by the anesthesia service.  Is positioned supine on the operative table with a bump under his left hip.  Patient had his wound VAC dressing removed.  Patient was prepped and draped in a sterile fashion.  A timeout was performed to verify the patient's name, date of birth, medical record number, correct site of surgery correct procedure to be performed.  Once all in attendance were agreement case began.  Patient was given Ancef IV prior to the onset of the case.  Patient was found to have mild cloudy drainage from the distal aspect of the wound at the anterior aspect of the distal fibula.  A culture swab was used to sample this fluid to send to the lab for  Gram stain and culture.    After culture, the patient had the left ankle wound pulse lavaged with GU infused saline.  Patient then had an Acell Cytal 7x10 patch sewed into the periphery of the wound with 1000mg  of MicroMatrix powder applied under the Cytal sheet.   A wound Medela VAC was then applied over the Cytal patch.  The wound VAC was functioning properly in the OR.  The patient then had a cast applied to stabilize his left ankle which has not yet healed.  Patient's wound infection necessitated the removal of his hardware prematurely.  The cast was fabricated to allow for a window to allow for access to the wound VAC sponge.  Patient was awoken and brought to the PACU in stable condition.  I scrubbed and present for the entire case.  I called the patient's daughter and left a message for her to let her know that her father was stable in recovery room.

## 2019-04-16 NOTE — Anesthesia Postprocedure Evaluation (Signed)
Anesthesia Post Note  Patient: Victor Wise  Procedure(s) Performed: APPLICATION OF WOUND VAC (Left Ankle) APPLICATION OF A-CELL OF EXTREMITY (Left )  Patient location during evaluation: PACU Anesthesia Type: General Level of consciousness: awake and alert Pain management: pain level controlled Vital Signs Assessment: post-procedure vital signs reviewed and stable Respiratory status: spontaneous breathing, nonlabored ventilation, respiratory function stable and patient connected to nasal cannula oxygen Cardiovascular status: blood pressure returned to baseline and stable Postop Assessment: no apparent nausea or vomiting Anesthetic complications: no     Last Vitals:  Vitals:   04/16/19 1645 04/16/19 1650  BP:    Pulse: (!) 59 (!) 59  Resp: (!) 9 (!) 9  Temp:    SpO2: 100% 95%    Last Pain:  Vitals:   04/16/19 1625  TempSrc:   PainSc: 10-Worst pain ever                 Molli Barrows

## 2019-04-16 NOTE — Progress Notes (Signed)
Patient had xl BM today

## 2019-04-16 NOTE — Progress Notes (Signed)
OT Cancellation Note  Patient Details Name: Victor Wise MRN: [id number] DOB: 1950-08-04   Cancelled Treatment:    Reason Eval/Treat Not Completed: Patient declined, no reason specified. In coordination with PT, pt refusing therapy at this time 2/2 lack of sleep and wanting to rest prior to planned surgery this afternoon. Will re-attempt at later date/time as pt is agreeable and medically appropriate.   Jeni Salles, MPH, MS, OTR/L ascom [phone] 04/16/19, 11:48 AM

## 2019-04-16 NOTE — Progress Notes (Signed)
Patient is receiving wound vac change today , when this writer went to assess wound vac it is not draining and somehow cord had been tied up and no suctioning had occured

## 2019-04-16 NOTE — Progress Notes (Signed)
PT Cancellation Note  Patient Details Name: Victor Wise MRN: [id number] DOB: 06/06/1950   Cancelled Treatment:    Reason Eval/Treat Not Completed: Fatigue/lethargy limiting ability to participate   Pt declined session.  Stated he did not sleep last night and was trying to rest before wound vac change in OR.   Discussed with Care Management regarding equipment and need for EMS transport home given his limited mobility and NWB status.   Chesley Noon 04/16/2019, 11:53 AM

## 2019-04-16 NOTE — Progress Notes (Signed)
Patient remains NPO , given Potassium and Oxycontin for pain with small sips of water. PICC to RUE , Patient is alert and oriented. Consent for procedure signed and report given to OR nurse .

## 2019-04-16 NOTE — OR Nursing (Signed)
Patient had a covid test this am but results are not ready yet. Dr. Ronelle Nigh and anesthesia team has been notified and he has cleared patient to go to OR.

## 2019-04-16 NOTE — Transfer of Care (Signed)
Immediate Anesthesia Transfer of Care Note  Patient: Victor Wise  Procedure(s) Performed: APPLICATION OF WOUND VAC (Left Ankle) APPLICATION OF A-CELL OF EXTREMITY (Left )  Patient Location: PACU  Anesthesia Type:MAC  Level of Consciousness: awake, alert  and oriented  Airway & Oxygen Therapy: Patient Spontanous Breathing and Patient connected to face mask oxygen  Post-op Assessment: Report given to RN and Post -op Vital signs reviewed and stable  Post vital signs: Reviewed and stable  Last Vitals:  Vitals Value Taken Time  BP    Temp    Pulse    Resp    SpO2      Last Pain:  Vitals:   04/16/19 1409  TempSrc: Temporal  PainSc: 0-No pain      Patients Stated Pain Goal: 1 (44/46/19 0122)  Complications: No apparent anesthesia complications

## 2019-04-17 ENCOUNTER — Encounter: Payer: Self-pay | Admitting: Orthopedic Surgery

## 2019-04-17 DIAGNOSIS — Z95828 Presence of other vascular implants and grafts: Secondary | ICD-10-CM

## 2019-04-17 DIAGNOSIS — B9561 Methicillin susceptible Staphylococcus aureus infection as the cause of diseases classified elsewhere: Secondary | ICD-10-CM

## 2019-04-17 DIAGNOSIS — Z9689 Presence of other specified functional implants: Secondary | ICD-10-CM

## 2019-04-17 DIAGNOSIS — D649 Anemia, unspecified: Secondary | ICD-10-CM

## 2019-04-17 LAB — AEROBIC/ANAEROBIC CULTURE W GRAM STAIN (SURGICAL/DEEP WOUND)
Gram Stain: NONE SEEN
Gram Stain: NONE SEEN
Gram Stain: NONE SEEN

## 2019-04-17 LAB — GLUCOSE, CAPILLARY
Glucose-Capillary: 119 mg/dL — ABNORMAL HIGH (ref 70–99)
Glucose-Capillary: 144 mg/dL — ABNORMAL HIGH (ref 70–99)
Glucose-Capillary: 140 mg/dL — ABNORMAL HIGH (ref 70–99)
Glucose-Capillary: 130 mg/dL — ABNORMAL HIGH (ref 70–99)

## 2019-04-17 NOTE — Progress Notes (Signed)
PROGRESS NOTE    Victor Wise  [id number] DOB: [birth year] DOA: 04/10/2019  PCP: Lavera Guise, MD    LOS - 7   Brief Narrative:  68 y.o. male, admitted to orthopedic service for left ankle wound dehiscence which began 7 weeks postop when the patient was converted from a cast to a walking boot.  Hospitalists consulted for IV antibiotics and other medical management.  Infectious disease following.  Subjective 12/2: Patient asleep with blanket covering his head but awakes easily.  Is wearing nasal CPAP.  No acute events reported overnight.  Patient reports pain adequately controlled right now.  Denies other acute complaints, says he just wants to be left alone to rest.  Assessment & Plan:   Principal Problem:   Infected hardware in left lower extremity ([place]) Active Problems:   Mixed hyperlipidemia   Essential hypertension   OSA on CPAP   Benign essential hypertension   Coronary artery disease due to lipid rich plaque   Type 2 diabetes mellitus with hyperlipidemia (HCC)   Benign prostatic hyperplasia without lower urinary tract symptoms   Obesity, Class III, BMI 40-49.9 (morbid obesity) ([place])   Bimalleolar fracture of left ankle   Wound dehiscence, surgical   Pyogenic inflammation of bone (HCC)   Preop examination   Pressure injury of skin   Anemia   Hypokalemia   Diabetic polyneuropathy associated with type 2 diabetes mellitus (HCC)   Left ankle osteomyelitis with hardware infection -Orthopedics is primary -Infectious disease following -PICC line placed 12/1 -Continue Ancef 2 g IV every 8 hours for 6-week (course to end January 10) -Weekly labs to include CBC with differential, CMP, ESR while on IV antibiotics -Wound care -ID follow-up in clinic in 4 weeks -Nonweightbearing on the left lower extremity -Maintain wound VAC  Type 2 diabetes with hyperglycemia Hyperlipidemia Peripheral neuropathy Prior toe amputations -Levemir 48 units nightly -Sliding scale  insulin -Continue Zetia, fenofibrate  Essential hypertension -chronic, stable -Continue amlodipine, benazepril, HCTZ, Lasix  Chronic systolic CHF, EF 40% -No signs of acute CHF -Continue home ACE and Lasix  UTI -urine culture grew E. coli, sensitive to Ancef  Hypokalemia -resolved -Monitor and replete as needed  Postop anemia -stable with hemoglobin 8.0 and no signs of bleeding - Monitor CBC -Transfuse if hemoglobin less than 7  Obstructive sleep apnea -continue CPAP  Coronary artery disease -continue aspirin and Plavix  DVT prophylaxis: SCDs   Code Status: Full Code  Family Communication: None at bedside Disposition Plan: Likely home with home health, expect medically ready tomorrow.  Pending clearance by Ortho   Consultants:   Ortho is primary  Infectious disease  Procedures:   12/1 -wound VAC replacement  Antimicrobials:   Ancef -end date May 26, 2019   Objective: Vitals:   04/16/19 1813 04/17/19 0007 04/17/19 0439 04/17/19 0803  BP: (!) 142/64 (!) 168/79 (!) 159/84 138/70  Pulse: 60 60 60 60  Resp: _0 Temp: 98.2 F (36.8 C) 98.2 F (36.8 C) 98 F (36.7 C)   TempSrc:      SpO2: 98% 98% 100% 99%  Weight:      Height:        Intake/Output Summary (Last 24 hours) at 04/17/2019 1227 Last data filed at 04/17/2019 1023 Gross per 24 hour  Intake 535.46 ml  Output 705 ml  Net -169.54 ml   Filed Weights   04/10/19 0840  Weight: 122.5 kg    Examination:  General exam: awake, alert, no acute  distress, obese HEENT: Nasal CPAP in place, hearing grossly normal  Respiratory system: clear to auscultation bilaterally, no wheezes, rales or rhonchi, normal respiratory effort. Cardiovascular system: normal S1/S2, RRR, no JVD, murmurs, rubs, gallops Gastrointestinal system: soft, non-tender, non-distended abdomen Central nervous system: alert and oriented x4. no gross focal neurologic deficits, normal speech Extremities: Left lower extremity  dressed and elevated, wound VAC in place Skin: dry, intact, normal temperature Psychiatry: normal mood, congruent affect, judgement and insight appear normal    Data Reviewed: I have personally reviewed following labs and imaging studies  CBC: Recent Labs  Lab 04/11/19 0551 04/13/19 0544 04/14/19 0729 04/15/19 0455 04/16/19 0418  WBC 5.1 4.4 3.6*  --   --   HGB 9.3* 8.0* 7.9* 7.9* 8.0*  HCT 29.7* 24.9* 25.4*  --   --   MCV 78.8* 76.9* 78.2*  --   --   PLT 301 241 209  --   --    Basic Metabolic Panel: Recent Labs  Lab 04/11/19 0551 04/12/19 0618 04/13/19 0544 04/14/19 0729 04/15/19 0455 04/16/19 0418  NA 140 141 142  --   --  140  K 3.7 3.7 3.4*  --   --  3.4*  CL 106 106 107  --   --  105  CO2 _0 --   --  28  GLUCOSE 165* 195* 156*  --   --  212*  BUN _1 --   --  18  CREATININE 0.66 0.51* 0.70 0.91 0.93 0.84  CALCIUM 8.7* 9.0 8.6*  --   --  8.7*   GFR: Estimated Creatinine Clearance: 108.6 mL/min (by C-G formula based on SCr of 0.84 mg/dL). Liver Function Tests: No results for input(s): AST, ALT, ALKPHOS, BILITOT, PROT, ALBUMIN in the last 168 hours. No results for input(s): LIPASE, AMYLASE in the last 168 hours. No results for input(s): AMMONIA in the last 168 hours. Coagulation Profile: No results for input(s): INR, PROTIME in the last 168 hours. Cardiac Enzymes: No results for input(s): CKTOTAL, CKMB, CKMBINDEX, TROPONINI in the last 168 hours. BNP (last 3 results) No results for input(s): PROBNP in the last 8760 hours. HbA1C: No results for input(s): HGBA1C in the last 72 hours. CBG: Recent Labs  Lab 04/16/19 1612 04/16/19 1735 04/16/19 2130 04/17/19 0801 04/17/19 1143  GLUCAP 132* 143* 129* 119* 144*   Lipid Profile: No results for input(s): CHOL, HDL, LDLCALC, TRIG, CHOLHDL, LDLDIRECT in the last 72 hours. Thyroid Function Tests: No results for input(s): TSH, T4TOTAL, FREET4, T3FREE, THYROIDAB in the last 72 hours. Anemia Panel:  No results for input(s): VITAMINB12, FOLATE, FERRITIN, TIBC, IRON, RETICCTPCT in the last 72 hours. Sepsis Labs: No results for input(s): PROCALCITON, LATICACIDVEN in the last 168 hours.  Recent Results (from the past 240 hour(s))  SARS CORONAVIRUS 2 (TAT 6-24 HRS) Nasopharyngeal Nasopharyngeal Swab     Status: None   Collection Time: 04/08/19  1:20 PM   Specimen: Nasopharyngeal Swab  Result Value Ref Range Status   SARS Coronavirus 2 NEGATIVE NEGATIVE Final    Comment: (NOTE) SARS-CoV-2 target nucleic acids are NOT DETECTED. The SARS-CoV-2 RNA is generally detectable in upper and lower respiratory specimens during the acute phase of infection. Negative results do not preclude SARS-CoV-2 infection, do not rule out co-infections with other pathogens, and should not be used as the sole basis for treatment or other patient management decisions. Negative results must be combined with clinical observations, patient history, and epidemiological information. The  expected result is Negative. Fact Sheet for Patients: SugarRoll.be Fact Sheet for Healthcare Providers: https://www.woods-mathews.com/ This test is not yet approved or cleared by the [place] FDA and  has been authorized for detection and/or diagnosis of SARS-CoV-2 by FDA under an Emergency Use Authorization (EUA). This EUA will remain  in effect (meaning this test can be used) for the duration of the COVID-19 declaration under Section 56 4(b)(1) of the Act, 21 U.S.C. section 360bbb-3(b)(1), unless the authorization is terminated or revoked sooner. Performed at [facility], [place] [address] [place], [place] [address]   Urine culture     Status: Abnormal   Collection Time: 04/10/19 11:28 AM   Specimen: Urine, Random  Result Value Ref Range Status   Specimen Description   Final    URINE, RANDOM Performed at [facility], [address] [place], [place] [address]     Special Requests   Final    NONE Performed at [facility], [address] [place], [place] [address]    Culture >=100,000 COLONIES/mL ESCHERICHIA COLI (A)  Final   Report Status 04/12/2019 FINAL  Final   Organism ID, Bacteria ESCHERICHIA COLI (A)  Final      Susceptibility   Escherichia coli - MIC*    AMPICILLIN 16 INTERMEDIATE Intermediate     CEFAZOLIN <=4 SENSITIVE Sensitive     CEFTRIAXONE <=1 SENSITIVE Sensitive     CIPROFLOXACIN >=4 RESISTANT Resistant     GENTAMICIN <=1 SENSITIVE Sensitive     IMIPENEM <=0.25 SENSITIVE Sensitive     NITROFURANTOIN 32 SENSITIVE Sensitive     TRIMETH/SULFA <=20 SENSITIVE Sensitive     AMPICILLIN/SULBACTAM 4 SENSITIVE Sensitive     PIP/TAZO 8 SENSITIVE Sensitive     Extended ESBL NEGATIVE Sensitive     * >=100,000 COLONIES/mL ESCHERICHIA COLI  Surgical pcr screen     Status: Abnormal   Collection Time: 04/12/19  7:10 AM   Specimen: Nasal Mucosa; Nasal Swab  Result Value Ref Range Status   MRSA, PCR NEGATIVE NEGATIVE Final   Staphylococcus aureus POSITIVE (A) NEGATIVE Final    Comment: (NOTE) The Xpert SA Assay (FDA approved for NASAL specimens in patients 63 years of age and older), is one component of a comprehensive surveillance program. It is not intended to diagnose infection nor to guide or monitor treatment. Performed at [facility], [address] [place], [place] [address]   Aerobic/Anaerobic Culture (surgical/deep wound)     Status: None (Preliminary result)   Collection Time: 04/12/19  8:46 AM   Specimen: PATH Cytology Misc. fluid; Body Fluid  Result Value Ref Range Status   Specimen Description   Final    WOUND LEFT FIBULA Performed at [facility], [address], [place], [place] [address]    Special Requests   Final    NONE Performed at [facility], [address] [place], [place] [address]    Gram Stain   Final    NO WBC SEEN NO ORGANISMS SEEN Performed at [facility], [place] [address] [place], [place] [address]    Culture   Final    FEW STAPHYLOCOCCUS AUREUS NO ANAEROBES ISOLATED; CULTURE IN PROGRESS FOR 5 DAYS    Report Status PENDING  Incomplete   Organism ID, Bacteria STAPHYLOCOCCUS AUREUS  Final      Susceptibility   Staphylococcus aureus - MIC*    CIPROFLOXACIN >=8 RESISTANT Resistant     ERYTHROMYCIN <=0.25 SENSITIVE Sensitive  GENTAMICIN <=0.5 SENSITIVE Sensitive     OXACILLIN <=0.25 SENSITIVE Sensitive     TETRACYCLINE <=1 SENSITIVE Sensitive     VANCOMYCIN <=0.5 SENSITIVE Sensitive     TRIMETH/SULFA <=10 SENSITIVE Sensitive     CLINDAMYCIN <=0.25 SENSITIVE Sensitive     RIFAMPIN <=0.5 SENSITIVE Sensitive     Inducible Clindamycin NEGATIVE Sensitive     * FEW STAPHYLOCOCCUS AUREUS  Aerobic/Anaerobic Culture (surgical/deep wound)     Status: None (Preliminary result)   Collection Time: 04/12/19  9:01 AM   Specimen: PATH Cytology Misc. fluid; Body Fluid  Result Value Ref Range Status   Specimen Description   Final    WOUND Performed at [facility], [address] [place], [place] [address]    Special Requests   Final    LEFT ANKLE JOINT FLUID ASPIRATION Performed at [facility], [address] [place], [place] [address]    Gram Stain   Final    NO WBC SEEN NO ORGANISMS SEEN Performed at [facility], [place] [address], [place], [place] [address]    Culture   Final    RARE STAPHYLOCOCCUS AUREUS NO ANAEROBES ISOLATED; CULTURE IN PROGRESS FOR 5 DAYS    Report Status PENDING  Incomplete   Organism ID, Bacteria STAPHYLOCOCCUS AUREUS  Final      Susceptibility   Staphylococcus aureus - MIC*    CIPROFLOXACIN >=8 RESISTANT Resistant     ERYTHROMYCIN <=0.25 SENSITIVE Sensitive     GENTAMICIN <=0.5 SENSITIVE Sensitive     OXACILLIN <=0.25 SENSITIVE Sensitive     TETRACYCLINE <=1 SENSITIVE Sensitive     VANCOMYCIN <=0.5 SENSITIVE Sensitive     TRIMETH/SULFA <=10 SENSITIVE Sensitive      CLINDAMYCIN <=0.25 SENSITIVE Sensitive     RIFAMPIN <=0.5 SENSITIVE Sensitive     Inducible Clindamycin NEGATIVE Sensitive     * RARE STAPHYLOCOCCUS AUREUS  Aerobic/Anaerobic Culture (surgical/deep wound)     Status: None (Preliminary result)   Collection Time: 04/12/19  9:01 AM   Specimen: PATH Soft tissue  Result Value Ref Range Status   Specimen Description   Final    TISSUE Performed at [facility], [place] [address], [place], [place] [address]    Special Requests   Final    WOUND TISSUE LEFT ANKLE Performed at [facility], [place], [place] [address]    Gram Stain   Final    MODERATE WBC PRESENT, PREDOMINANTLY PMN RARE GRAM POSITIVE COCCI Performed at [facility], [place] [address] [place], [place] [address]    Culture   Final    FEW STAPHYLOCOCCUS AUREUS NO ANAEROBES ISOLATED; CULTURE IN PROGRESS FOR 5 DAYS    Report Status PENDING  Incomplete   Organism ID, Bacteria STAPHYLOCOCCUS AUREUS  Final      Susceptibility   Staphylococcus aureus - MIC*    CIPROFLOXACIN >=8 RESISTANT Resistant     ERYTHROMYCIN <=0.25 SENSITIVE Sensitive     GENTAMICIN <=0.5 SENSITIVE Sensitive     OXACILLIN <=0.25 SENSITIVE Sensitive     TETRACYCLINE <=1 SENSITIVE Sensitive     VANCOMYCIN <=0.5 SENSITIVE Sensitive     TRIMETH/SULFA <=10 SENSITIVE Sensitive     CLINDAMYCIN <=0.25 SENSITIVE Sensitive     RIFAMPIN <=0.5 SENSITIVE Sensitive     Inducible Clindamycin NEGATIVE Sensitive     * FEW STAPHYLOCOCCUS AUREUS  Aerobic/Anaerobic Culture (surgical/deep wound)     Status: None (Preliminary result)   Collection Time: 04/12/19  9:15 AM  Specimen: PATH Cytology Misc. fluid; Body Fluid  Result Value Ref Range Status   Specimen Description   Final    WOUND Performed at [facility], [address], [place], [place] [address]    Special Requests   Final    WOUND CULTURE LEFT ANKLE Performed at [facility], [address]  [place], [place] [address]    Gram Stain   Final    NO WBC SEEN NO ORGANISMS SEEN Performed at [facility], [place] [address], [place], [place] [address]    Culture   Final    FEW STAPHYLOCOCCUS AUREUS NO ANAEROBES ISOLATED; CULTURE IN PROGRESS FOR 5 DAYS    Report Status PENDING  Incomplete   Organism ID, Bacteria STAPHYLOCOCCUS AUREUS  Final      Susceptibility   Staphylococcus aureus - MIC*    CIPROFLOXACIN >=8 RESISTANT Resistant     ERYTHROMYCIN <=0.25 SENSITIVE Sensitive     GENTAMICIN <=0.5 SENSITIVE Sensitive     OXACILLIN <=0.25 SENSITIVE Sensitive     TETRACYCLINE <=1 SENSITIVE Sensitive     VANCOMYCIN 1 SENSITIVE Sensitive     TRIMETH/SULFA <=10 SENSITIVE Sensitive     CLINDAMYCIN <=0.25 SENSITIVE Sensitive     RIFAMPIN <=0.5 SENSITIVE Sensitive     Inducible Clindamycin NEGATIVE Sensitive     * FEW STAPHYLOCOCCUS AUREUS  SARS CORONAVIRUS 2 (TAT 6-24 HRS) Nasopharyngeal Nasopharyngeal Swab     Status: None   Collection Time: 04/16/19  9:56 AM   Specimen: Nasopharyngeal Swab  Result Value Ref Range Status   SARS Coronavirus 2 NEGATIVE NEGATIVE Final    Comment: (NOTE) SARS-CoV-2 target nucleic acids are NOT DETECTED. The SARS-CoV-2 RNA is generally detectable in upper and lower respiratory specimens during the acute phase of infection. Negative results do not preclude SARS-CoV-2 infection, do not rule out co-infections with other pathogens, and should not be used as the sole basis for treatment or other patient management decisions. Negative results must be combined with clinical observations, patient history, and epidemiological information. The expected result is Negative. Fact Sheet for Patients: SugarRoll.be Fact Sheet for Healthcare Providers: https://www.woods-mathews.com/ This test is not yet approved or cleared by the [place] FDA and  has been authorized for detection and/or diagnosis of SARS-CoV-2 by FDA  under an Emergency Use Authorization (EUA). This EUA will remain  in effect (meaning this test can be used) for the duration of the COVID-19 declaration under Section 56 4(b)(1) of the Act, 21 U.S.C. section 360bbb-3(b)(1), unless the authorization is terminated or revoked sooner. Performed at [facility], [place] [address] [place], [place] [address]   Aerobic/Anaerobic Culture (surgical/deep wound)     Status: None (Preliminary result)   Collection Time: 04/16/19  3:24 PM   Specimen: PATH Other; Wound  Result Value Ref Range Status   Specimen Description   Final    WOUND left ankle Performed at [place] [address], [place], [place] [address]    Special Requests   Final    NONE Performed at [facility], [address], [place] [address]    Gram Stain   Final    FEW WBC PRESENT, PREDOMINANTLY PMN RARE GRAM POSITIVE COCCI    Culture   Final    NO GROWTH < 12 HOURS Performed at [facility] [facility] [address] [place], [place] [address]    Report Status PENDING  Incomplete         Radiology Studies:  [place] Ekg Site Rite  Result Date: 04/15/2019 If Site Rite image not attached, placement could not be confirmed due to current cardiac rhythm.       Scheduled Meds: . amLODipine  10 mg Oral Daily  . aspirin EC  81 mg Oral Daily  . benazepril  20 mg Oral BID  . Chlorhexidine Gluconate Cloth  6 each Topical Q0600  . clopidogrel  75 mg Oral Daily  . DULoxetine  30 mg Oral QPM  . DULoxetine  60 mg Oral Daily  . ezetimibe  10 mg Oral Daily  . feeding supplement (PRO-STAT SUGAR FREE 64)  30 mL Oral Daily  . fenofibrate  54 mg Oral q1800  . furosemide  20 mg Oral Daily  . hydrochlorothiazide  12.5 mg Oral Daily  . insulin aspart  0-20 Units Subcutaneous TID WC  . insulin detemir  48 Units Subcutaneous QHS  . linaclotide  290 mcg Oral QAC breakfast  . multivitamin with minerals  1 tablet Oral Daily  . mupirocin ointment  1  application Nasal BID  . omega-3 acid ethyl esters  1 g Oral BID  . oxcarbazepine  600 mg Oral BID  . oxyCODONE  20 mg Oral Q12H  . pantoprazole  40 mg Oral Daily  . pregabalin  200 mg Oral Daily  . pregabalin  400 mg Oral Daily  . sodium chloride flush  10-40 mL Intracatheter Q12H  . tamsulosin  0.4 mg Oral q1800  . vitamin C  500 mg Oral BID   Continuous Infusions: .  ceFAZolin (ANCEF) IV 2 g (04/17/19 0539)     LOS: 7 days    Time spent: 30-35 minutes    Ezekiel Slocumb, DO [facility] Pager: [phone]  If 7PM-7AM, please contact night-coverage [email or web address] Password [facility] 04/17/2019, 12:27 PM

## 2019-04-17 NOTE — Treatment Plan (Signed)
Diagnosis: MSSA left ankle infection Baseline Creatinine  0.84    Allergies  Allergen Reactions  . Amoxicillin Other (See Comments)    Has never taken amoxicillin -ENT told him not to take (? Had skin test) Has patient had a PCN reaction causing immediate rash, facial/tongue/throat swelling, SOB or lightheadedness with hypotension: No Has patient had a PCN reaction causing severe rash involving mucus membranes or skin necrosis: No Has patient had a PCN reaction that required hospitalization No Has patient had a PCN reaction occurring within the last 10 years: No If above answers are "NO", then may proceed w/ Cephalo  . Other Anaphylaxis, Itching and Other (See Comments)    Pt states that he is allergic to Endopa.  Reaction:  Anaphylaxis  Pt states that he is allergic to Metabisulfites Reaction:  Itching   . Penicillins Other (See Comments)    Arm turned "blue" at injection site (no treatment needed) Has patient had a PCN reaction causing immediate rash, facial/tongue/throat swelling, SOB or lightheadedness with hypotension: No Has patient had a PCN reaction causing severe rash involving mucus membranes or skin necrosis: No Has patient had a PCN reaction that required hospitalization No Has patient had a PCN reaction occurring within the last 10 years: No If all of the above answers are "NO", then may proceed with Cephalosporin use.  [name] Hydralazine Other (See Comments)    Reaction:  Cramping of extremities Pt reports tolerating low dose 12.23m but no higher.  . Cephalexin Itching  . Clindamycin Itching  . Crestor [Rosuvastatin Calcium] Other (See Comments)    Reaction:  Pt is unable to move arms/legs   . Metoprolol Nausea And Vomiting  . Red Dye Itching  . Statins Other (See Comments)    Reaction:  Pt is unable to move arms/legs  . Sulfa Antibiotics Itching    Other reaction(s): Unknown  . Yellow Dyes (Non-Tartrazine) Itching  . Aspirin Itching and Other (See Comments)    Pt  states that he is able to use in lower doses.    . Tape Rash    Please use "paper" tape only.     OPAT Orders Cefazolin 2 grams IV every 8 hours for 6 weeks total until 05/26/19- Jan 10th    P[place]Per Protocol inculding placement of biopatch:  Labs weekly while on IV antibiotics: __X CBC with differential  _X_ CMP  _X_ ESR   _X_ Please pull PIC at completion of IV antibiotics   Fax weekly labs to Dr.Victoria Euceda promptly at  [phone] Clinic Follow Up Appt with Infectious Disease - Dr.Jocee Kissick:in 4 weeks   Call [phone] make appt

## 2019-04-17 NOTE — Progress Notes (Signed)
Subjective:  POD #1 s/p wound VAC change and ACell placement.   Patient is concerned about cost of VAC unit as an outpatient.  Patient reports left ankle pain as mild to moderate.    Objective:   VITALS:   Vitals:   04/16/19 1813 04/17/19 0007 04/17/19 0439 04/17/19 0803  BP: (!) 142/64 (!) 168/79 (!) 159/84 138/70  Pulse: 60 60 60 60  Resp: 20 18 17 15   Temp: 98.2 F (36.8 C) 98.2 F (36.8 C) 98 F (36.7 C)   TempSrc:      SpO2: 98% 98% 100% 99%  Weight:      Height:        PHYSICAL EXAM: Left lower extremity: Cast intact.  VAC dressing functioning.  Patient can flex and extend toes gently.  His toes appear well perfused.   LABS  Results for orders placed or performed during the hospital encounter of 04/10/19 (from the past 24 hour(s))  Aerobic/Anaerobic Culture (surgical/deep wound)     Status: None (Preliminary result)   Collection Time: 04/16/19  3:24 PM   Specimen: PATH Other; Wound  Result Value Ref Range   Specimen Description      WOUND left ankle Performed at [facility] [address], [place], [place] [address]    Special Requests      NONE Performed at [facility], [place], [place] [address]    Gram Stain      FEW WBC PRESENT, PREDOMINANTLY PMN RARE GRAM POSITIVE COCCI    Culture      NO GROWTH < 12 HOURS Performed at [facility] [address] [place], [place] [address]    Report Status PENDING   Glucose, capillary     Status: Abnormal   Collection Time: 04/16/19  4:12 PM  Result Value Ref Range   Glucose-Capillary 132 (H) 70 - 99 mg/dL  Glucose, capillary     Status: Abnormal   Collection Time: 04/16/19  5:35 PM  Result Value Ref Range   Glucose-Capillary 143 (H) 70 - 99 mg/dL  Glucose, capillary     Status: Abnormal   Collection Time: 04/16/19  9:30 PM  Result Value Ref Range   Glucose-Capillary 129 (H) 70 - 99 mg/dL  Glucose, capillary     Status: Abnormal   Collection Time: 04/17/19  8:01 AM   Result Value Ref Range   Glucose-Capillary 119 (H) 70 - 99 mg/dL  Glucose, capillary     Status: Abnormal   Collection Time: 04/17/19 11:43 AM  Result Value Ref Range   Glucose-Capillary 144 (H) 70 - 99 mg/dL    No results found.  Assessment/Plan: 1 Day Post-Op   Principal Problem:   Infected hardware in left lower extremity (HCC) Active Problems:   Mixed hyperlipidemia   Essential hypertension   OSA on CPAP   Benign essential hypertension   Coronary artery disease due to lipid rich plaque   Type 2 diabetes mellitus with hyperlipidemia (HCC)   Benign prostatic hyperplasia without lower urinary tract symptoms   Obesity, Class III, BMI 40-49.9 (morbid obesity) ([place])   Bimalleolar fracture of left ankle   Wound dehiscence, surgical   Pyogenic inflammation of bone (HCC)   Preop examination   Pressure injury of skin   Anemia   Hypokalemia   Diabetic polyneuropathy associated with type 2 diabetes mellitus ([place])  Continue with IV antibiotics as recommended by ID.  Continue VAC dressing.  Case manager working on obtaining approval  for placement at [facility].  Patient may participate with physical therapy but is nonweightbearing on the left lower extremity.    Thornton Park , MD 04/17/2019, 2:31 PM

## 2019-04-17 NOTE — TOC Progression Note (Signed)
Transition of Care [facility]) - Progression Note    Patient Details  Name: Victor Wise MRN: [id number] Date of Birth: 1951-04-06  Transition of Care [facility]) CM/SW [place], RN Phone Number: 04/17/2019, 10:17 AM  Clinical Narrative:    Notified Brad with [employer] that the patient needs a wound vac, he accepted and will get it taken care of   Expected Discharge Plan: [place] Barriers to Discharge: Continued Medical Work up  Expected Discharge Plan and Services Expected Discharge Plan: [place]   Discharge Planning Services: CM Consult   Living arrangements for the past 2 months: [place]                 DME Arranged: N/A         HH Arranged: RN, PT, OT, Nurse's Aide, Social Work [employer] Agency: [facility] Date [place]: 04/15/19 Time [place]: 69 Representative spoke with at [place]: [place] ([place]) Interventions    Readmission Risk Interventions No flowsheet data found.

## 2019-04-17 NOTE — Progress Notes (Signed)
Upon arrival to patients room pt covered in his covers over his head. This nurse had to engage patient in conservation. Patient refused an full assessment and placed his covers back over his head.

## 2019-04-17 NOTE — Progress Notes (Signed)
PHARMACY CONSULT NOTE FOR:  OUTPATIENT  PARENTERAL ANTIBIOTIC THERAPY (OPAT)  Indication: MSSA ankle infection Regimen: cefazolin 2gm IV q8h End date: 05/26/2019  IV antibiotic discharge orders are pended. To discharging provider:  please sign these orders via discharge navigator,  Select New Orders & click on the button choice - Manage This Unsigned Work.     Thank you for allowing pharmacy to be a part of this patient's care.  Doreene Eland, PharmD, BCPS.   Work Cell: [phone] 04/17/2019 12:38 PM

## 2019-04-17 NOTE — Progress Notes (Signed)
PT Cancellation Note  Patient Details Name: Victor Wise MRN: [id number] DOB: May 05, 1951   Cancelled Treatment:    Reason Eval/Treat Not Completed: Patient declined, no reason specified.  Pt found with covers pulled mostly over his head and would only communicate with head movements.  Pt shook head 'no' when asked if he would participate with PT services.  Asked pt why he didn't feel like he could participate, offered suggestions such as fatigue or pain with pt not responding.  Asked pt if he could hear me to which he nodded 'yes'.  Asked pt if he would be willing to tell me why he didn't feel able to participate with PT services with pt shaking his head 'no', nursing notified.  Discussed with nursing benefit of possible psych consult.  Will attempt to see pt at a future date/time as medically appropriate.   Linus Salmons PT, DPT 04/17/19, 3:10 PM

## 2019-04-17 NOTE — Plan of Care (Signed)
Pt refuses to talk to this RN. Pt repeats to "leave me alone" "dont touch me" when doing assessment. Attempted to give pt emotional support. Pdowless,rn

## 2019-04-17 NOTE — TOC Progression Note (Addendum)
Transition of Care [facility]) - Progression Note    Patient Details  Name: Victor Wise MRN: [id number] Date of Birth: 1950/08/12  Transition of Care [facility]) CM/SW [place], RN Phone Number: 04/17/2019, 12:14 PM  Clinical Narrative:    Leroy Sea with [employer] spoke to the patient about the copay for the wound vac and he stated that he was not able to pay any of it, he was asked if he could pay a portion, he stated he was not paying a dime.  I Spoke with Doroteo Bradford at [employer], She agrees that the patient does meet criteria to be a patient at [facility] and that they would start the insurance authorization pre cert process today.  Expected Discharge Plan: [place] Barriers to Discharge: Continued Medical Work up  Expected Discharge Plan and Services Expected Discharge Plan: [place]   Discharge Planning Services: CM Consult   Living arrangements for the past 2 months: [place]                 DME Arranged: N/A         HH Arranged: RN, PT, OT, Nurse's Aide, Social Work [employer] Agency: [facility] Date [place]: 04/15/19 Time [place]: 17 Representative spoke with at [place]: [place] ([place]) Interventions    Readmission Risk Interventions No flowsheet data found.

## 2019-04-17 NOTE — Progress Notes (Signed)
Date of Admission:  04/10/2019     ID: Victor Wise is a 68 y.o. male  Principal Problem:   Infected hardware in left lower extremity ([place]) Active Problems:   Mixed hyperlipidemia   Essential hypertension   OSA on CPAP   Benign essential hypertension   Coronary artery disease due to lipid rich plaque   Type 2 diabetes mellitus with hyperlipidemia (HCC)   Benign prostatic hyperplasia without lower urinary tract symptoms   Obesity, Class III, BMI 40-49.9 (morbid obesity) ([place])   Bimalleolar fracture of left ankle   Wound dehiscence, surgical   Pyogenic inflammation of bone (HCC)   Preop examination   Pressure injury of skin   Anemia   Hypokalemia   Diabetic polyneuropathy associated with type 2 diabetes mellitus (HCC)    Subjective: No itching with cefazolin No fever   Medications:  . amLODipine  10 mg Oral Daily  . aspirin EC  81 mg Oral Daily  . benazepril  20 mg Oral BID  . Chlorhexidine Gluconate Cloth  6 each Topical Q0600  . clopidogrel  75 mg Oral Daily  . DULoxetine  30 mg Oral QPM  . DULoxetine  60 mg Oral Daily  . ezetimibe  10 mg Oral Daily  . feeding supplement (PRO-STAT SUGAR FREE 64)  30 mL Oral Daily  . fenofibrate  54 mg Oral q1800  . furosemide  20 mg Oral Daily  . hydrochlorothiazide  12.5 mg Oral Daily  . insulin aspart  0-20 Units Subcutaneous TID WC  . insulin detemir  48 Units Subcutaneous QHS  . linaclotide  290 mcg Oral QAC breakfast  . multivitamin with minerals  1 tablet Oral Daily  . mupirocin ointment  1 application Nasal BID  . omega-3 acid ethyl esters  1 g Oral BID  . oxcarbazepine  600 mg Oral BID  . oxyCODONE  20 mg Oral Q12H  . pantoprazole  40 mg Oral Daily  . pregabalin  200 mg Oral Daily  . pregabalin  400 mg Oral Daily  . sodium chloride flush  10-40 mL Intracatheter Q12H  . tamsulosin  0.4 mg Oral q1800  . vitamin C  500 mg Oral BID    Objective: Vital signs in last 24 hours: Temp:  [97.5 F (36.4 C)-99.3 F (37.4  C)] 98 F (36.7 C) (12/02 0439) Pulse Rate:  [58-61] 60 (12/02 0803) Resp:  [9-20] 15 (12/02 0803) BP: (138-168)/(64-84) 138/70 (12/02 0803) SpO2:  [93 %-100 %] 99 % (12/02 0803)  PHYSICAL EXAM:  General: lying with his face covered , on removing the blanket patient was alert but quiet, nodded yes or no to questions and followed  commands appropriately. Looked depressed Head: Normocephalic, without obvious abnormality, atraumatic. Eyes: Conjunctivae clear, anicteric sclerae. Pupils are equal ENT Nares normal. No drainage or sinus tenderness. Lips, mucosa, and tongue normal. No Thrush Neck: Supple, symmetrical, no adenopathy, thyroid: non tender no carotid bruit and no JVD. Lungs: b/l air entry Heart: S1s2, pacemaker site fine Extremities:rt PICc Left leg surgical dressing not removed Skin: No rashes or lesions. Or bruising Lymph: Cervical, supraclavicular normal. Neurologic: Grossly non-focal  Lab Results Recent Labs    04/15/19 0455 04/16/19 0418  HGB 7.9* 8.0*  NA  --  140  K  --  3.4*  CL  --  105  CO2  --  28  BUN  --  18  CREATININE 0.93 0.84   Liver Panel No results for input(s): PROT, ALBUMIN, AST, ALT,  ALKPHOS, BILITOT, BILIDIR, IBILI in the last 72 hours. Sedimentation Rate No results for input(s): ESRSEDRATE in the last 72 hours. C-Reactive Protein No results for input(s): CRP in the last 72 hours.  Microbiology:  Studies/Results: [place] Ekg Site Rite  Result Date: 04/15/2019 If Site Rite image not attached, placement could not be confirmed due to current cardiac rhythm.    Assessment/Plan:  Left  ankle prosthesis infection- MSSA- s/p removal of all hardware- on cefazolin Will need 6 weeks of IV antibiotic 05/26/19  Multiple antibiotic allergy ( PCN, amoxil, cephalexin- most of it itching)  has been tolerating cefazolin- can use benadryl for simple rash. Watch closely for any rash.    Anemia  HTn: on amlodipine and  benazepril DM  PAD Pacemaker  Spinal implant  Discussed the management with the patient and care team Pt will benefit from LTAC as he is unsafe for home discharge. Discussed with case manager

## 2019-04-18 DIAGNOSIS — F331 Major depressive disorder, recurrent, moderate: Secondary | ICD-10-CM

## 2019-04-18 LAB — CBC
HCT: 27.2 % — ABNORMAL LOW (ref 39.0–52.0)
Hemoglobin: 8.5 g/dL — ABNORMAL LOW (ref 13.0–17.0)
MCH: 24.4 pg — ABNORMAL LOW (ref 26.0–34.0)
MCHC: 31.3 g/dL (ref 30.0–36.0)
MCV: 77.9 fL — ABNORMAL LOW (ref 80.0–100.0)
Platelets: 206 10*3/uL (ref 150–400)
RBC: 3.49 MIL/uL — ABNORMAL LOW (ref 4.22–5.81)
RDW: 16.6 % — ABNORMAL HIGH (ref 11.5–15.5)
WBC: 4.2 10*3/uL (ref 4.0–10.5)
nRBC: 0 % (ref 0.0–0.2)

## 2019-04-18 LAB — BASIC METABOLIC PANEL
Anion gap: 9 (ref 5–15)
BUN: 12 mg/dL (ref 8–23)
CO2: 27 mmol/L (ref 22–32)
Calcium: 8.9 mg/dL (ref 8.9–10.3)
Chloride: 106 mmol/L (ref 98–111)
Creatinine, Ser: 0.81 mg/dL (ref 0.61–1.24)
GFR calc Af Amer: >60 mL/min (ref >60)
GFR calc non Af Amer: >60 mL/min (ref >60)
Glucose, Bld: 151 mg/dL — ABNORMAL HIGH (ref 70–99)
Potassium: 3.3 mmol/L — ABNORMAL LOW (ref 3.5–5.1)
Sodium: 142 mmol/L (ref 135–145)

## 2019-04-18 LAB — GLUCOSE, CAPILLARY
Glucose-Capillary: 146 mg/dL — ABNORMAL HIGH (ref 70–99)
Glucose-Capillary: 121 mg/dL — ABNORMAL HIGH (ref 70–99)
Glucose-Capillary: 115 mg/dL — ABNORMAL HIGH (ref 70–99)
Glucose-Capillary: 117 mg/dL — ABNORMAL HIGH (ref 70–99)

## 2019-04-18 MED ORDER — POTASSIUM CHLORIDE CRYS ER 20 MEQ PO TBCR
40.0000 meq | EXTENDED_RELEASE_TABLET | Freq: Once | ORAL | Status: AC
  Administered 2019-04-18: 40 meq via ORAL
  Filled 2019-04-18: qty 2

## 2019-04-18 NOTE — Progress Notes (Signed)
Subjective:  POD #2 s/p application of ACell and wound VAC change.   Patient reports left ankle pain as mild to moderate.  Patient continuing to lay flat in bed with the covers over his head.  Objective:   VITALS:   Vitals:   04/17/19 1600 04/17/19 1601 04/18/19 0148 04/18/19 0827  BP: 117/79 117/79 124/73 113/74  Pulse: 63 63 61 61  Resp: 15 17 17 17   Temp:   98.5 F (36.9 C) 98.6 F (37 C)  TempSrc:   Oral Oral  SpO2: 98% 98% 93% 97%  Weight:      Height:        PHYSICAL EXAM: Left lower extremity: Cast in place.  VAC dressing appears to be functioning.  Patient can flex and extend his toes.  His foot is well-perfused.   LABS  Results for orders placed or performed during the hospital encounter of 04/10/19 (from the past 24 hour(s))  Glucose, capillary     Status: Abnormal   Collection Time: 04/17/19 11:43 AM  Result Value Ref Range   Glucose-Capillary 144 (H) 70 - 99 mg/dL  Glucose, capillary     Status: Abnormal   Collection Time: 04/17/19  5:09 PM  Result Value Ref Range   Glucose-Capillary 140 (H) 70 - 99 mg/dL  Glucose, capillary     Status: Abnormal   Collection Time: 04/17/19 10:02 PM  Result Value Ref Range   Glucose-Capillary 130 (H) 70 - 99 mg/dL  CBC     Status: Abnormal   Collection Time: 04/18/19  4:52 AM  Result Value Ref Range   WBC 4.2 4.0 - 10.5 K/uL   RBC 3.49 (L) 4.22 - 5.81 MIL/uL   Hemoglobin 8.5 (L) 13.0 - 17.0 g/dL   HCT 27.2 (L) 39.0 - 52.0 %   MCV 77.9 (L) 80.0 - 100.0 fL   MCH 24.4 (L) 26.0 - 34.0 pg   MCHC 31.3 30.0 - 36.0 g/dL   RDW 16.6 (H) 11.5 - 15.5 %   Platelets 206 150 - 400 K/uL   nRBC 0.0 0.0 - 0.2 %  Basic metabolic panel     Status: Abnormal   Collection Time: 04/18/19  4:52 AM  Result Value Ref Range   Sodium 142 135 - 145 mmol/L   Potassium 3.3 (L) 3.5 - 5.1 mmol/L   Chloride 106 98 - 111 mmol/L   CO2 27 22 - 32 mmol/L   Glucose, Bld 151 (H) 70 - 99 mg/dL   BUN 12 8 - 23 mg/dL   Creatinine, Ser 0.81 0.61 - 1.24  mg/dL   Calcium 8.9 8.9 - 10.3 mg/dL   GFR calc non Af Amer >60 >60 mL/min   GFR calc Af Amer >60 >60 mL/min   Anion gap 9 5 - 15  Glucose, capillary     Status: Abnormal   Collection Time: 04/18/19  8:25 AM  Result Value Ref Range   Glucose-Capillary 146 (H) 70 - 99 mg/dL   Comment 1 Notify RN     No results found.  Assessment/Plan: 2 Days Post-Op   Principal Problem:   Infected hardware in left lower extremity (HCC) Active Problems:   Mixed hyperlipidemia   Essential hypertension   OSA on CPAP   Benign essential hypertension   Coronary artery disease due to lipid rich plaque   Type 2 diabetes mellitus with hyperlipidemia (HCC)   Benign prostatic hyperplasia without lower urinary tract symptoms   Obesity, Class III, BMI 40-49.9 (morbid obesity) ([place])  Bimalleolar fracture of left ankle   Wound dehiscence, surgical   Pyogenic inflammation of bone (HCC)   Preop examination   Pressure injury of skin   Anemia   Hypokalemia   Diabetic polyneuropathy associated with type 2 diabetes mellitus ([place])  Patient has been refusing PT.  He has told the social worker that he will refuse outpatient antibiotic and VAC dressing treatment.  I explained to the patient that without these treatments he would likely lose his lower leg.  He does not seem to exhibit good insight into his condition.  I am going to order a psychiatry consult for evaluation of depression and to evaluate his capacity to make medical decisions.  We are awaiting insurance approval for the patient to go to LTAC.  This would be the best case scenario for the patient to continue VAC dressing and antibiotic treatment, as I do not feel that he would be able of taking care of himself at home even with home services.    Th[place] , MD 04/18/2019, 11:06 AM

## 2019-04-18 NOTE — Progress Notes (Signed)
OT Cancellation Note  Patient Details Name: DEZMIN SEITTER MRN: [id number] DOB: April 10, 1951   Cancelled Treatment:    Reason Eval/Treat Not Completed: Patient declined, no reason specified  Pt with covers pulled over head when OT presents for treatment. OT attempts to engage pt in a variety of ways in some level of meaningful occupation. Pt with minimal wakefulness initially. Does eventually verbally respond to name, but otherwise keeps cover over head and only shakes or nods head briefly in response. Pt offers no reason for declining to participate. Will f/u for OT tx on next scheduled date as able/as pt agreeable.   Gerrianne Scale, [place], OTR/L ascom [phone] 04/18/19, 2:53 PM

## 2019-04-18 NOTE — TOC Progression Note (Signed)
Transition of Care [facility]) - Progression Note    Patient Details  Name: Victor Wise MRN: [id number] Date of Birth: Sep 14, 1950  Transition of Care [facility]) CM/SW Contact  Su Hilt, RN Phone Number: 04/18/2019, 4:22 PM  Clinical Narrative:    Called the patients daughter [name] and left a VM for a return call   Expected Discharge Plan: [place] Barriers to Discharge: Continued Medical Work up  Expected Discharge Plan and Services Expected Discharge Plan: [place]   Discharge Planning Services: CM Consult   Living arrangements for the past 2 months: [place]                 DME Arranged: N/A         HH Arranged: RN, PT, OT, Nurse's Aide, Social Work [facility] Agency: [facility] Date [place]: 04/15/19 Time [place]: 5 Representative spoke with at [place]: [place] ([place]) Interventions    Readmission Risk Interventions No flowsheet data found.

## 2019-04-18 NOTE — Consult Note (Signed)
[facility] Face-to-Face Psychiatry Consult   Reason for Consult: Depression Referring Physician: Thornton Park Patient Identification: Victor Wise MRN:  [id number] Principal Diagnosis: Infected hardware in left lower extremity [facility]) Diagnosis:  Principal Problem:   Infected hardware in left lower extremity ([place]) Active Problems:   Mixed hyperlipidemia   Essential hypertension   OSA on CPAP   Benign essential hypertension   Coronary artery disease due to lipid rich plaque   Type 2 diabetes mellitus with hyperlipidemia ([place])   Benign prostatic hyperplasia without lower urinary tract symptoms   Obesity, Class III, BMI 40-49.9 (morbid obesity) ([place])   Bimalleolar fracture of left ankle   Wound dehiscence, surgical   Pyogenic inflammation of bone (HCC)   Preop examination   Pressure injury of skin   Anemia   Hypokalemia   Diabetic polyneuropathy associated with type 2 diabetes mellitus ([place])   Total Time spent with patient: 45 minutes  Subjective:   Victor Wise is a 68 y.o. male patient admitted for foot surgery.  HPI: Psychiatry consulted due to patient's uncooperativeness and showing poor insight into his illness and condition.  It was thought that his depression may have a factor in this.  Upon evaluation patient was difficult to arouse and was sleeping during the middle of the day.  He was visited 2 times before finally being able to be woken.  Upon evaluation patient was calm and cooperative with Probation officer.  Stated that he has been feeling depressed but denied any suicidal thinking.  Patient reports that he is depressed due to his current condition has been out of work for close to 30 years.  Patient reports that he is in the hospital for foot surgery but he remains unsure as to his status upon leaving the hospital.  Patient feels that he may not be able to take care of his wound properly if not given the proper medications.  However when the medication team came to speak to the patient  he denied being able to pay the co-pay.  Patient alleges that he would actually want the medications and the wound VAC however he is afraid that he will be unable to pay for it as they are expensive and he is short money every month.  Patient states that he frequently has to borrow money from his stepson in order to get by.  Denies having social contacts which were able to provide him with the necessary funds.  Patient denies that his depression is any role in his uncooperativeness with the team prior.  He states that he just feels that his financial situation will be detrimental to his care.  Patient denies any audiovisual hallucinations.  Denies any current or previous suicidal ideation.  Denies any homicidal ideation.  Patient is future oriented and hopes to get better and get his strength back.   Past Psychiatric History: Denies any previous suicide attempts.  Denies any previous psychiatric hospitalizations.  Reports taking Cymbalta for years as prescribed by his primary care doctor. Risk to Self:  No Risk to Others:  No Prior Inpatient Therapy:  No Prior Outpatient Therapy:  Yes  Past Medical History:  Past Medical History:  Diagnosis Date  . Allergy   . Basal cell carcinoma    forehead  . BPH (benign prostatic hyperplasia)   . CHF (congestive heart failure) ([place])   . Chronic back pain   . Coronary artery disease   . Depression   . Diabetes ([place])   . GERD (gastroesophageal reflux disease)   .  Heart attack ([place])    2018  . Hypertension   . Morbid obesity ([place])   . Obstructive sleep apnea   . 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 Status post insertion of spinal cord stimulator   . Stroke [facility])    last one 2-3 years ago  . UTI (lower urinary tract infection)     Past Surgical History:  Procedure Laterality Date  . AMPUTATION TOE Left 04/19/2018   Procedure: AMPUTATION TOE-LEFT GREAT TOE;  Surgeon: Sharlotte Alamo, DPM;  Location: [facility] ORS;  Service: Podiatry;  Laterality: Left;  .  APPLICATION OF A-CELL OF EXTREMITY Left 04/16/2019   Procedure: APPLICATION OF A-CELL OF EXTREMITY;  Surgeon: Thornton Park, MD;  Location: [facility] ORS;  Service: Orthopedics;  Laterality: Left;  . APPLICATION OF WOUND VAC Left 04/12/2019   Procedure: APPLICATION OF WOUND VAC;  Surgeon: Thornton Park, MD;  Location: [facility] ORS;  Service: Orthopedics;  Laterality: Left;  . APPLICATION OF WOUND VAC Left 04/16/2019   Procedure: APPLICATION OF WOUND VAC;  Surgeon: Thornton Park, MD;  Location: [facility] ORS;  Service: Orthopedics;  Laterality: Left;  . CARDIAC CATHETERIZATION N/A 12/19/2014   Procedure: Coronary Stent Intervention;  Surgeon: Charolette Forward, MD;  Location: [place] CV LAB;  Service: Cardiovascular;  Laterality: N/A;  . CARDIAC CATHETERIZATION Left 12/19/2014   Procedure: Left Heart Cath and Coronary Angiography;  Surgeon: Dionisio Larkin, MD;  Location: [place] CV LAB;  Service: Cardiovascular;  Laterality: Left;  . HARDWARE REMOVAL Left 04/12/2019   Procedure: HARDWARE REMOVAL;  Surgeon: Thornton Park, MD;  Location: [facility] ORS;  Service: Orthopedics;  Laterality: Left;  . INCISION AND DRAINAGE OF WOUND Left 04/12/2019   Procedure: IRRIGATION AND DEBRIDEMENT WOUND, WOUND VAC EXCHANGE;  Surgeon: Thornton Park, MD;  Location: [facility] ORS;  Service: Orthopedics;  Laterality: Left;  . INCISION AND DRAINAGE OF WOUND Left 04/12/2019   Procedure: IRRIGATION AND DEBRIDEMENT WOUND;  Surgeon: Thornton Park, MD;  Location: [facility] OR[facility];  Service: Orthopedics;  Laterality: Left;  . IRRIGATION AND DEBRIDEMENT FOOT Left 03/02/2018   Procedure: IRRIGATION AND DEBRIDEMENT FOOT;  Surgeon: Samara Deist, DPM;  Location: [facility] OR[facility];  Service: Podiatry;  Laterality: Left;  . IRRIGATION AND DEBRIDEMENT FOOT Left 03/08/2018   Procedure: IRRIGATION AND DEBRIDEMENT FOOT AND BONE;  Surgeon: Sharlotte Alamo, DPM;  Location: [facility] OR[facility];  Service: Podiatry;  Laterality: Left;  . IRRIGATION AND DEBRIDEMENT FOOT Left  04/19/2018   Procedure: IRRIGATION AND DEBRIDEMENT FOOT;  Surgeon: Sharlotte Alamo, DPM;  Location: [facility] OR[facility];  Service: Podiatry;  Laterality: Left;  . LOWER EXTREMITY ANGIOGRAPHY Left 03/05/2018   Procedure: Lower Extremity Angiography;  Surgeon: Algernon Huxley, MD;  Location: [place] CV LAB;  Service: Cardiovascular;  Laterality: Left;  . LOWER EXTREMITY ANGIOGRAPHY Left 03/08/2018   Procedure: LOWER EXTREMITY ANGIOGRAPHY;  Surgeon: Algernon Huxley, MD;  Location: [place] CV LAB;  Service: Cardiovascular;  Laterality: Left;  . ORIF ANKLE FRACTURE Left 01/31/2019   Procedure: open reduction internal fixation left bimalleolar ankle fracture;  Surgeon: Thornton Park, MD;  Location: [facility];  Service: Orthopedics;  Laterality: Left;  . PACEMAKER INSERTION Left 11/02/2015   Procedure: INSERTION PACEMAKER;  Surgeon: Isaias Cowman, MD;  Location: [facility] ORS;  Service: Cardiovascular;  Laterality: Left;  . Pain Stimulator    . Right toe amputation     Family History:  Family History  Problem Relation Age of Onset  . Breast cancer Mother   . Cancer Mother   . Hypertension Mother   .  Lung cancer Father   . Hypertension Father   . Heart disease Father   . Cancer Father   . Kidney disease Sister   . Prostate cancer Neg Hx    Family Psychiatric  History: Denies Social History:  Social History   Substance and Sexual Activity  Alcohol Use Not Currently  . Alcohol/week: 0.0 standard drinks   Comment: have not had alcohol in 45yrs     Social History   Substance and Sexual Activity  Drug Use No    Social History   Socioeconomic History  . Marital status: Divorced    Spouse name: Not on file  . Number of children: Not on file  . Years of education: Not on file  . Highest education level: Not on file  Occupational History  . Not on file  Social Needs  . Financial resource strain: Not on file  . Food insecurity    Worry: Not on file    Inability: Not on file  .  Transportation needs    Medical: Not on file    Non-medical: Not on file  Tobacco Use  . Smoking status: Former Smoker    Types: Cigarettes  . Smokeless tobacco: Never Used  . Tobacco comment: quit 45 years  Substance and Sexual Activity  . Alcohol use: Not Currently    Alcohol/week: 0.0 standard drinks    Comment: have not had alcohol in 5yrs  . Drug use: No  . Sexual activity: Not on file  Lifestyle  . Physical activity    Days per week: Not on file    Minutes per session: Not on file  . Stress: Not on file  Relationships  . Social Herbalist on phone: Not on file    Gets together: Not on file    Attends religious service: Not on file    Active member of club or organization: Not on file    Attends meetings of clubs or organizations: Not on file    Relationship status: Not on file  Other Topics Concern  . Not on file  Social History Narrative  . Not on file   Additional Social History: Patient reports living alone.  Denies substance use    Allergies:   Allergies  Allergen Reactions  . Amoxicillin Other (See Comments)    Has never taken amoxicillin -ENT told him not to take (? Had skin test) Has patient had a PCN reaction causing immediate rash, facial/tongue/throat swelling, SOB or lightheadedness with hypotension: No Has patient had a PCN reaction causing severe rash involving mucus membranes or skin necrosis: No Has patient had a PCN reaction that required hospitalization No Has patient had a PCN reaction occurring within the last 10 years: No If above answers are "NO", then may proceed w/ Cephalo  . Other Anaphylaxis, Itching and Other (See Comments)    Pt states that he is allergic to Endopa.  Reaction:  Anaphylaxis  Pt states that he is allergic to Metabisulfites Reaction:  Itching   . Penicillins Other (See Comments)    Arm turned "blue" at injection site (no treatment needed) Has patient had a PCN reaction causing immediate rash,  facial/tongue/throat swelling, SOB or lightheadedness with hypotension: No Has patient had a PCN reaction causing severe rash involving mucus membranes or skin necrosis: No Has patient had a PCN reaction that required hospitalization No Has patient had a PCN reaction occurring within the last 10 years: No If all of the above answers are "NO",  then may proceed with Cephalosporin use.  [name] Hydralazine Other (See Comments)    Reaction:  Cramping of extremities Pt reports tolerating low dose 12.5mg  but no higher.  . Cephalexin Itching  . Clindamycin Itching  . Crestor [Rosuvastatin Calcium] Other (See Comments)    Reaction:  Pt is unable to move arms/legs   . Metoprolol Nausea And Vomiting  . Red Dye Itching  . Statins Other (See Comments)    Reaction:  Pt is unable to move arms/legs  . Sulfa Antibiotics Itching    Other reaction(s): Unknown  . Yellow Dyes (Non-Tartrazine) Itching  . Aspirin Itching and Other (See Comments)    Pt states that he is able to use in lower doses.    . Tape Rash    Please use "paper" tape only.     Labs:  Results for orders placed or performed during the hospital encounter of 04/10/19 (from the past 48 hour(s))  Glucose, capillary     Status: Abnormal   Collection Time: 04/16/19  5:35 PM  Result Value Ref Range   Glucose-Capillary 143 (H) 70 - 99 mg/dL  Glucose, capillary     Status: Abnormal   Collection Time: 04/16/19  9:30 PM  Result Value Ref Range   Glucose-Capillary 129 (H) 70 - 99 mg/dL  Glucose, capillary     Status: Abnormal   Collection Time: 04/17/19  8:01 AM  Result Value Ref Range   Glucose-Capillary 119 (H) 70 - 99 mg/dL  Glucose, capillary     Status: Abnormal   Collection Time: 04/17/19 11:43 AM  Result Value Ref Range   Glucose-Capillary 144 (H) 70 - 99 mg/dL  Glucose, capillary     Status: Abnormal   Collection Time: 04/17/19  5:09 PM  Result Value Ref Range   Glucose-Capillary 140 (H) 70 - 99 mg/dL  Glucose, capillary      Status: Abnormal   Collection Time: 04/17/19 10:02 PM  Result Value Ref Range   Glucose-Capillary 130 (H) 70 - 99 mg/dL  CBC     Status: Abnormal   Collection Time: 04/18/19  4:52 AM  Result Value Ref Range   WBC 4.2 4.0 - 10.5 K/uL   RBC 3.49 (L) 4.22 - 5.81 MIL/uL   Hemoglobin 8.5 (L) 13.0 - 17.0 g/dL    Comment: Reticulocyte Hemoglobin testing may be clinically indicated, consider ordering this additional test [id number]    HCT 27.2 (L) 39.0 - 52.0 %   MCV 77.9 (L) 80.0 - 100.0 fL   MCH 24.4 (L) 26.0 - 34.0 pg   MCHC 31.3 30.0 - 36.0 g/dL   RDW 16.6 (H) 11.5 - 15.5 %   Platelets 206 150 - 400 K/uL   nRBC 0.0 0.0 - 0.2 %    Comment: Performed at [facility], [address] [place], [place] [id number]  Basic metabolic panel     Status: Abnormal   Collection Time: 04/18/19  4:52 AM  Result Value Ref Range   Sodium 142 135 - 145 mmol/L   Potassium 3.3 (L) 3.5 - 5.1 mmol/L   Chloride 106 98 - 111 mmol/L   CO2 27 22 - 32 mmol/L   Glucose, Bld 151 (H) 70 - 99 mg/dL   BUN 12 8 - 23 mg/dL   Creatinine, Ser 0.81 0.61 - 1.24 mg/dL   Calcium 8.9 8.9 - 10.3 mg/dL   GFR calc non Af Amer >60 >60 mL/min   GFR calc Af Amer >60 >60 mL/min   Anion  gap 9 5 - 15    Comment: Performed at [facility], [address] [place], [place] [address]  Glucose, capillary     Status: Abnormal   Collection Time: 04/18/19  8:25 AM  Result Value Ref Range   Glucose-Capillary 146 (H) 70 - 99 mg/dL   Comment 1 Notify RN   Glucose, capillary     Status: Abnormal   Collection Time: 04/18/19 12:13 PM  Result Value Ref Range   Glucose-Capillary 121 (H) 70 - 99 mg/dL   Comment 1 Notify RN   Glucose, capillary     Status: Abnormal   Collection Time: 04/18/19  4:35 PM  Result Value Ref Range   Glucose-Capillary 115 (H) 70 - 99 mg/dL   Comment 1 Notify RN     Current Facility-Administered Medications  Medication Dose Route Frequency Provider Last Rate Last Dose  . acetaminophen  (TYLENOL) tablet 650 mg  650 mg Oral Q6H PRN Thornton Park, MD      . amLODipine (NORVASC) tablet 10 mg  10 mg Oral Daily Thornton Park, MD   10 mg at 04/18/19 0906  . aspirin EC tablet 81 mg  81 mg Oral Daily Thornton Park, MD   81 mg at 04/18/19 0906  . benazepril (LOTENSIN) tablet 20 mg  20 mg Oral BID Thornton Park, MD   20 mg at 04/18/19 0908  . ceFAZolin (ANCEF) IVPB 2g/100 mL premix  2 g Intravenous Q8H Thornton Park, MD 200 mL/hr at 04/18/19 1557 2 g at 04/18/19 1557  . clopidogrel (PLAVIX) tablet 75 mg  75 mg Oral Daily Thornton Park, MD   75 mg at 04/18/19 0906  . DULoxetine (CYMBALTA) DR capsule 30 mg  30 mg Oral QPM Thornton Park, MD   30 mg at 04/17/19 1755  . DULoxetine (CYMBALTA) DR capsule 60 mg  60 mg Oral Daily Thornton Park, MD   60 mg at 04/18/19 0908  . ezetimibe (ZETIA) tablet 10 mg  10 mg Oral Daily Thornton Park, MD   10 mg at 04/18/19 0906  . feeding supplement (PRO-STAT SUGAR FREE 64) liquid 30 mL  30 mL Oral Daily Thornton Park, MD   30 mL at 04/15/19 0959  . fenofibrate tablet 54 mg  54 mg Oral q1800 Thornton Park, MD   54 mg at 04/15/19 1728  . furosemide (LASIX) tablet 20 mg  20 mg Oral Daily Thornton Park, MD   20 mg at 04/18/19 0907  . hydrochlorothiazide (HYDRODIURIL) tablet 12.5 mg  12.5 mg Oral Daily Thornton Park, MD   12.5 mg at 04/18/19 0909  . insulin aspart (novoLOG) injection 0-20 Units  0-20 Units Subcutaneous TID WC Thornton Park, MD   3 Units at 04/18/19 1246  . insulin detemir (LEVEMIR) injection 48 Units  48 Units Subcutaneous QHS Thornton Park, MD   48 Units at 04/17/19 2216  . lidocaine (LIDODERM) 5 % 1 patch  1 patch Transdermal Daily PRN Thornton Park, MD      . linaclotide Rolan Lipa) capsule 290 mcg  290 mcg Oral QAC breakfast Thornton Park, MD   290 mcg at 04/18/19 0907  . metoCLOPramide (REGLAN) tablet 5-10 mg  5-10 mg Oral Q8H PRN Thornton Park, MD       Or  . metoCLOPramide (REGLAN)  injection 5-10 mg  5-10 mg Intravenous Q8H PRN Thornton Park, MD      . morphine 2 MG/ML injection 2 mg  2 mg Intravenous Q2H PRN Thornton Park, MD   2 mg  at 04/12/19 2239  . multivitamin with minerals tablet 1 tablet  1 tablet Oral Daily Thornton Park, MD   1 tablet at 04/18/19 0906  . mupirocin ointment (BACTROBAN) 2 % 1 application  1 application Nasal BID Thornton Park, MD   1 application at [id number]  . naloxone [facility]) nasal spray 4 mg/0.1 mL  4 mg Nasal PRN Thornton Park, MD      . omega-3 acid ethyl esters (LOVAZA) capsule 1 g  1 g Oral BID Thornton Park, MD   1 g at 04/18/19 0906  . ondansetron (ZOFRAN) tablet 4 mg  4 mg Oral Q6H PRN Thornton Park, MD       Or  . ondansetron [facility]) injection 4 mg  4 mg Intravenous Q6H PRN Thornton Park, MD      . Oxcarbazepine (TRILEPTAL) tablet 600 mg  600 mg Oral BID Thornton Park, MD   600 mg at 04/18/19 [id number]  . oxyCODONE (Oxy IR/ROXICODONE) immediate release tablet 5-10 mg  5-10 mg Oral Q4H PRN Thornton Park, MD   10 mg at 04/16/19 0244  . oxyCODONE (OXYCONTIN) 12 hr tablet 20 mg  20 mg Oral Q12H Thornton Park, MD   20 mg at 04/18/19 0906  . pantoprazole (PROTONIX) EC tablet 40 mg  40 mg Oral Daily Thornton Park, MD   40 mg at 04/18/19 0908  . pregabalin (LYRICA) capsule 200 mg  200 mg Oral Daily Thornton Park, MD   200 mg at 04/17/19 2216  . pregabalin (LYRICA) capsule 400 mg  400 mg Oral Daily Thornton Park, MD   400 mg at 04/18/19 0906  . sodium chloride flush (NS) 0.9 % injection 10-40 mL  10-40 mL Intracatheter Q12H Thornton Park, MD   10 mL at 04/18/19 0909  . sodium chloride flush (NS) 0.9 % injection 10-40 mL  10-40 mL Intracatheter PRN Thornton Park, MD      . tamsulosin [facility]) capsule 0.4 mg  0.4 mg Oral q1800 Thornton Park, MD   0.4 mg at 04/17/19 1755  . vitamin C (ASCORBIC ACID) tablet 500 mg  500 mg Oral BID Thornton Park, MD   500 mg at 04/18/19 0907     Musculoskeletal: Strength & Muscle Tone: decreased Gait & Station: unable to stand Patient leans: N/A  Psychiatric Specialty Exam: Physical Exam  Review of Systems  Eyes: Negative for blurred vision.  Respiratory: Negative for cough.   Cardiovascular: Negative for chest pain.  Gastrointestinal: Negative for heartburn.  Genitourinary: Negative for dysuria.  Psychiatric/Behavioral: Positive for depression. Negative for hallucinations, memory loss, substance abuse and suicidal ideas. The patient is not nervous/anxious and does not have insomnia.     Blood pressure 131/80, pulse 62, temperature 98 F (36.7 C), temperature source Oral, resp. rate 17, height 5\' 8"  (1.727 m), weight 122.5 kg, SpO2 97 %.Body mass index is 41.05 kg/m.  General Appearance: Disheveled  Eye Contact:  Fair  Speech:  Clear and Coherent  Volume:  Decreased  Mood:  Depressed  Affect:  Depressed  Thought Process:  Coherent  Orientation:  Full (Time, Place, and Person)  Thought Content:  Logical  Suicidal Thoughts:  No  Homicidal Thoughts:  No  Memory:  Recent;   Good  Judgement:  Intact  Insight:  Fair  Psychomotor Activity:  Normal  Concentration:  Concentration: Fair  Recall:  [employer] of Knowledge:  Fair  Language:  Fair  Akathisia:  No  Handed:  Right  AIMS (if indicated):  Assets:  Communication Skills Leisure Time Resilience  ADL's:  Intact  Cognition:  WNL  Sleep:        Treatment Plan Summary: Daily contact with patient to assess and evaluate symptoms and progress in treatment   At this point no medication changes are indicated as patient has previously tried supplementing his regimen with Wellbutrin and has felt that in the past.  Patient's depression seems mostly situational and not contributing to his current clinical decision making.  Patient will benefit from long-term acute care so that he can get the necessary treatment for his foot wound.  Disposition: No evidence of  imminent risk to self or others at present.   Patient does not meet criteria for psychiatric inpatient admission. Supportive therapy provided about ongoing stressors. Discussed crisis plan, support from social network, calling 911, coming to the Emergency Department, and calling Suicide Hotline.  Dixie Dials, MD 04/18/2019 4:44 PM

## 2019-04-18 NOTE — Progress Notes (Addendum)
Pt BS 117 tonight. Pt had a poor intake today. Has Levemir 48 units due. [facility] made aware. Okay to hold levermir tonight.

## 2019-04-18 NOTE — Progress Notes (Signed)
PROGRESS NOTE    Victor Wise  OYD:[id number] DOB: Sep 21, 1950 DOA: 04/10/2019  PCP: Lavera Guise, MD    LOS - 8   Brief Narrative:  68 y.o.male, admitted to orthopedic service forleft ankle wound dehiscence which began 7 weeks postop when the patient was converted from a cast to a walking boot.  Hospitalists consulted for IV antibiotics and other medical management.  Infectious disease following.   Subjective 12/3: Patient sleeping with blanket covering head.  Awoke to voice.  States pain 8 out of 10, but just got medicine a few minutes earlier.  States medication controls pain fairly well.  Denies fever/chills, chest pain, SOB, N/V/D or other acute complaints.   Assessment & Plan:   Principal Problem:   Infected hardware in left lower extremity (HCC) Active Problems:   Mixed hyperlipidemia   Essential hypertension   OSA on CPAP   Benign essential hypertension   Coronary artery disease due to lipid rich plaque   Type 2 diabetes mellitus with hyperlipidemia (HCC)   Benign prostatic hyperplasia without lower urinary tract symptoms   Obesity, Class III, BMI 40-49.9 (morbid obesity) ([place])   Bimalleolar fracture of left ankle   Wound dehiscence, surgical   Pyogenic inflammation of bone (HCC)   Preop examination   Pressure injury of skin   Anemia   Hypokalemia   Diabetic polyneuropathy associated with type 2 diabetes mellitus (HCC)   Left ankle osteomyelitis with hardware infection -Orthopedics is primary -Infectious disease following -PICC line placed 12/1 -Continue Ancef 2 g IV every 8 hours for 6-week (course to end January 10) -Weekly labs to include CBC with differential, CMP, ESR while on IV antibiotics -Wound care -ID follow-up in clinic in 4 weeks -Nonweightbearing on the left lower extremity -Maintain wound VAC --Ortho has ordered psych consult at pt refusing outpatient antibiotics and VAC dressing, suspect depression or other psych condition and lack of  insight to his condition.  Patient aware he could lose lower leg if condition not treated properly. --SW awaiting insurance approval for LTAC.  Type 2 diabetes with hyperglycemia Hyperlipidemia Peripheral neuropathy Prior toe amputations -Levemir 48 units nightly -Sliding scale insulin -Continue Zetia, fenofibrate  Essential hypertension -chronic, stable -Continue amlodipine, benazepril, HCTZ, Lasix  Chronic systolic CHF, EF 67% -No signs of acute CHF -Continue home ACE and Lasix  UTI -urine culture grew E. coli, sensitive to Ancef  Hypokalemia -resolved -Monitor and replete as needed  Postop anemia -stable with hemoglobin 8.0 and no signs of bleeding - Monitor CBC -Transfuse if hemoglobin less than 7  Obstructive sleep apnea -continue CPAP  Coronary artery disease -continue aspirin and Plavix  DVT prophylaxis: SCDs   Code Status: Full Code  Family Communication: None at bedside Disposition Plan: Likely home with home health, expect medically ready tomorrow.  Pending clearance by Ortho   Consultants:   Ortho is primary  Infectious disease  Procedures:   12/1 -wound VAC replacement  Antimicrobials:   Ancef -end date May 26, 2019   Objective: Vitals:   04/17/19 0803 04/17/19 1600 04/17/19 1601 04/18/19 0148  BP: 138/70 117/79 117/79 124/73  Pulse: 60 63 63 61  Resp: 15 15 17 17   Temp:    98.5 F (36.9 C)  TempSrc:    Oral  SpO2: 99% 98% 98% 93%  Weight:      Height:        Intake/Output Summary (Last 24 hours) at 04/18/2019 0810 Last data filed at 04/18/2019 0150 Gross per 24 hour  Intake 20 ml  Output 775 ml  Net -755 ml   Filed Weights   04/10/19 0840  Weight: 122.5 kg    Examination:  General exam: awake, alert, no acute distress HEENT: atraumatic, hearing grossly normal  Respiratory system: clear to auscultation bilaterally, no wheezes, rales or rhonchi, normal respiratory effort. Cardiovascular system: normal S1/S2,  RRR, no JVD, murmurs, rubs, gallops. Gastrointestinal system: soft, non-tender, non-distended abdomen, normal bowel sounds. Central nervous system: alert and oriented x3. no gross focal neurologic deficits, normal speech Extremities: no cyanosis, RLE in cast distal sensation intact Skin: dry, intact, normal temperature Psychiatry: normal mood, flat affect, judgement and insight appear abnormal    Data Reviewed: I have personally reviewed following labs and imaging studies  CBC: Recent Labs  Lab 04/13/19 0544 04/14/19 0729 04/15/19 0455 04/16/19 0418 04/18/19 0452  WBC 4.4 3.6*  --   --  4.2  HGB 8.0* 7.9* 7.9* 8.0* 8.5*  HCT 24.9* 25.4*  --   --  27.2*  MCV 76.9* 78.2*  --   --  77.9*  PLT 241 209  --   --  476   Basic Metabolic Panel: Recent Labs  Lab 04/12/19 0618 04/13/19 0544 04/14/19 0729 04/15/19 0455 04/16/19 0418 04/18/19 0452  NA 141 142  --   --  140 142  K 3.7 3.4*  --   --  3.4* 3.3*  CL 106 107  --   --  105 106  CO2 27 25  --   --  28 27  GLUCOSE 195* 156*  --   --  212* 151*  BUN 16 15  --   --  18 12  CREATININE 0.51* 0.70 0.91 0.93 0.84 0.81  CALCIUM 9.0 8.6*  --   --  8.7* 8.9   GFR: Estimated Creatinine Clearance: 112.7 mL/min (by C-G formula based on SCr of 0.81 mg/dL). Liver Function Tests: No results for input(s): AST, ALT, ALKPHOS, BILITOT, PROT, ALBUMIN in the last 168 hours. No results for input(s): LIPASE, AMYLASE in the last 168 hours. No results for input(s): AMMONIA in the last 168 hours. Coagulation Profile: No results for input(s): INR, PROTIME in the last 168 hours. Cardiac Enzymes: No results for input(s): CKTOTAL, CKMB, CKMBINDEX, TROPONINI in the last 168 hours. BNP (last 3 results) No results for input(s): PROBNP in the last 8760 hours. HbA1C: No results for input(s): HGBA1C in the last 72 hours. CBG: Recent Labs  Lab 04/16/19 2130 04/17/19 0801 04/17/19 1143 04/17/19 1709 04/17/19 2202  GLUCAP 129* 119* 144* 140*  130*   Lipid Profile: No results for input(s): CHOL, HDL, LDLCALC, TRIG, CHOLHDL, LDLDIRECT in the last 72 hours. Thyroid Function Tests: No results for input(s): TSH, T4TOTAL, FREET4, T3FREE, THYROIDAB in the last 72 hours. Anemia Panel: No results for input(s): VITAMINB12, FOLATE, FERRITIN, TIBC, IRON, RETICCTPCT in the last 72 hours. Sepsis Labs: No results for input(s): PROCALCITON, LATICACIDVEN in the last 168 hours.  Recent Results (from the past 240 hour(s))  SARS CORONAVIRUS 2 (TAT 6-24 HRS) Nasopharyngeal Nasopharyngeal Swab     Status: None   Collection Time: 04/08/19  1:20 PM   Specimen: Nasopharyngeal Swab  Result Value Ref Range Status   SARS Coronavirus 2 NEGATIVE NEGATIVE Final    Comment: (NOTE) SARS-CoV-2 target nucleic acids are NOT DETECTED. The SARS-CoV-2 RNA is generally detectable in upper and lower respiratory specimens during the acute phase of infection. Negative results do not preclude SARS-CoV-2 infection, do not rule out co-infections with other  pathogens, and should not be used as the sole basis for treatment or other patient management decisions. Negative results must be combined with clinical observations, patient history, and epidemiological information. The expected result is Negative. Fact Sheet for Patients: SugarRoll.be Fact Sheet for Healthcare Providers: https://www.woods-mathews.com/ This test is not yet approved or cleared by the [place] FDA and  has been authorized for detection and/or diagnosis of SARS-CoV-2 by FDA under an Emergency Use Authorization (EUA). This EUA will remain  in effect (meaning this test can be used) for the duration of the COVID-19 declaration under Section 56 4(b)(1) of the Act, 21 U.S.C. section 360bbb-3(b)(1), unless the authorization is terminated or revoked sooner. Performed at [facility], [place] [address] [place], [place] [address]   Urine culture      Status: Abnormal   Collection Time: 04/10/19 11:28 AM   Specimen: Urine, Random  Result Value Ref Range Status   Specimen Description   Final    URINE, RANDOM Performed at [facility], [address] [place], [place] [address]    Special Requests   Final    NONE Performed at [facility], [place]., [place], [place] [address]    Culture >=100,000 COLONIES/mL ESCHERICHIA COLI (A)  Final   Report Status 04/12/2019 FINAL  Final   Organism ID, Bacteria ESCHERICHIA COLI (A)  Final      Susceptibility   Escherichia coli - MIC*    AMPICILLIN 16 INTERMEDIATE Intermediate     CEFAZOLIN <=4 SENSITIVE Sensitive     CEFTRIAXONE <=1 SENSITIVE Sensitive     CIPROFLOXACIN >=4 RESISTANT Resistant     GENTAMICIN <=1 SENSITIVE Sensitive     IMIPENEM <=0.25 SENSITIVE Sensitive     NITROFURANTOIN 32 SENSITIVE Sensitive     TRIMETH/SULFA <=20 SENSITIVE Sensitive     AMPICILLIN/SULBACTAM 4 SENSITIVE Sensitive     PIP/TAZO 8 SENSITIVE Sensitive     Extended ESBL NEGATIVE Sensitive     * >=100,000 COLONIES/mL ESCHERICHIA COLI  Surgical pcr screen     Status: Abnormal   Collection Time: 04/12/19  7:10 AM   Specimen: Nasal Mucosa; Nasal Swab  Result Value Ref Range Status   MRSA, PCR NEGATIVE NEGATIVE Final   Staphylococcus aureus POSITIVE (A) NEGATIVE Final    Comment: (NOTE) The Xpert SA Assay (FDA approved for NASAL specimens in patients 60 years of age and older), is one component of a comprehensive surveillance program. It is not intended to diagnose infection nor to guide or monitor treatment. Performed at [facility], [address], [place], [place] [address]   Aerobic/Anaerobic Culture (surgical/deep wound)     Status: None   Collection Time: 04/12/19  8:46 AM   Specimen: PATH Cytology Misc. fluid; Body Fluid  Result Value Ref Range Status   Specimen Description   Final    WOUND LEFT FIBULA Performed at [facility], [place] [place], [place] [address]    Special Requests   Final    NONE Performed at [facility], [place] [place], [place] [address]    Gram Stain NO WBC SEEN NO ORGANISMS SEEN   Final   Culture   Final    FEW STAPHYLOCOCCUS AUREUS NO ANAEROBES ISOLATED Performed at [facility], [place] [address] [place],  [address]    Report Status 04/17/2019 FINAL  Final   Organism ID, Bacteria STAPHYLOCOCCUS AUREUS  Final      Susceptibility   Staphylococcus aureus -  MIC*    CIPROFLOXACIN >=8 RESISTANT Resistant     ERYTHROMYCIN <=0.25 SENSITIVE Sensitive     GENTAMICIN <=0.5 SENSITIVE Sensitive     OXACILLIN <=0.25 SENSITIVE Sensitive     TETRACYCLINE <=1 SENSITIVE Sensitive     VANCOMYCIN <=0.5 SENSITIVE Sensitive     TRIMETH/SULFA <=10 SENSITIVE Sensitive     CLINDAMYCIN <=0.25 SENSITIVE Sensitive     RIFAMPIN <=0.5 SENSITIVE Sensitive     Inducible Clindamycin NEGATIVE Sensitive     * FEW STAPHYLOCOCCUS AUREUS  Aerobic/Anaerobic Culture (surgical/deep wound)     Status: None   Collection Time: 04/12/19  9:01 AM   Specimen: PATH Cytology Misc. fluid; Body Fluid  Result Value Ref Range Status   Specimen Description   Final    WOUND Performed at [facility], [address], [place], [place] [address]    Special Requests   Final    LEFT ANKLE JOINT FLUID ASPIRATION Performed at [facility], [address] [place], [place] [address]    Gram Stain NO WBC SEEN NO ORGANISMS SEEN   Final   Culture   Final    RARE STAPHYLOCOCCUS AUREUS NO ANAEROBES ISOLATED Performed at [facility], [place] [address] [place], [place] [address]    Report Status 04/17/2019 FINAL  Final   Organism ID, Bacteria STAPHYLOCOCCUS AUREUS  Final      Susceptibility   Staphylococcus aureus - MIC*    CIPROFLOXACIN >=8 RESISTANT Resistant     ERYTHROMYCIN <=0.25 SENSITIVE Sensitive     GENTAMICIN <=0.5 SENSITIVE Sensitive     OXACILLIN <=0.25 SENSITIVE Sensitive      TETRACYCLINE <=1 SENSITIVE Sensitive     VANCOMYCIN <=0.5 SENSITIVE Sensitive     TRIMETH/SULFA <=10 SENSITIVE Sensitive     CLINDAMYCIN <=0.25 SENSITIVE Sensitive     RIFAMPIN <=0.5 SENSITIVE Sensitive     Inducible Clindamycin NEGATIVE Sensitive     * RARE STAPHYLOCOCCUS AUREUS  Aerobic/Anaerobic Culture (surgical/deep wound)     Status: None   Collection Time: 04/12/19  9:01 AM   Specimen: PATH Soft tissue  Result Value Ref Range Status   Specimen Description   Final    TISSUE Performed at [facility], [place] [address], [place], [place] [address]    Special Requests   Final    WOUND TISSUE LEFT ANKLE Performed at [facility], [address] [place], [place] [address]    Gram Stain   Final    MODERATE WBC PRESENT, PREDOMINANTLY PMN RARE GRAM POSITIVE COCCI    Culture   Final    FEW STAPHYLOCOCCUS AUREUS NO ANAEROBES ISOLATED Performed at [facility], [place] [address] [place],  [address]    Report Status 04/17/2019 FINAL  Final   Organism ID, Bacteria STAPHYLOCOCCUS AUREUS  Final      Susceptibility   Staphylococcus aureus - MIC*    CIPROFLOXACIN >=8 RESISTANT Resistant     ERYTHROMYCIN <=0.25 SENSITIVE Sensitive     GENTAMICIN <=0.5 SENSITIVE Sensitive     OXACILLIN <=0.25 SENSITIVE Sensitive     TETRACYCLINE <=1 SENSITIVE Sensitive     VANCOMYCIN <=0.5 SENSITIVE Sensitive     TRIMETH/SULFA <=10 SENSITIVE Sensitive     CLINDAMYCIN <=0.25 SENSITIVE Sensitive     RIFAMPIN <=0.5 SENSITIVE Sensitive     Inducible Clindamycin NEGATIVE Sensitive     * FEW STAPHYLOCOCCUS AUREUS  Aerobic/Anaerobic Culture (surgical/deep wound)     Status: None   Collection Time: 04/12/19  9:15 AM  Specimen: PATH Cytology Misc. fluid; Body Fluid  Result Value Ref Range Status   Specimen Description   Final    WOUND Performed at [facility], [address] [place], [place] [address]    Special Requests   Final    WOUND CULTURE LEFT ANKLE  Performed at [facility], [place] [place], [place] [address]    Gram Stain NO WBC SEEN NO ORGANISMS SEEN   Final   Culture   Final    FEW STAPHYLOCOCCUS AUREUS NO ANAEROBES ISOLATED Performed at [facility], [place] [address] [place], [place] [address]    Report Status 04/17/2019 FINAL  Final   Organism ID, Bacteria STAPHYLOCOCCUS AUREUS  Final      Susceptibility   Staphylococcus aureus - MIC*    CIPROFLOXACIN >=8 RESISTANT Resistant     ERYTHROMYCIN <=0.25 SENSITIVE Sensitive     GENTAMICIN <=0.5 SENSITIVE Sensitive     OXACILLIN <=0.25 SENSITIVE Sensitive     TETRACYCLINE <=1 SENSITIVE Sensitive     VANCOMYCIN 1 SENSITIVE Sensitive     TRIMETH/SULFA <=10 SENSITIVE Sensitive     CLINDAMYCIN <=0.25 SENSITIVE Sensitive     RIFAMPIN <=0.5 SENSITIVE Sensitive     Inducible Clindamycin NEGATIVE Sensitive     * FEW STAPHYLOCOCCUS AUREUS  SARS CORONAVIRUS 2 (TAT 6-24 HRS) Nasopharyngeal Nasopharyngeal Swab     Status: None   Collection Time: 04/16/19  9:56 AM   Specimen: Nasopharyngeal Swab  Result Value Ref Range Status   SARS Coronavirus 2 NEGATIVE NEGATIVE Final    Comment: (NOTE) SARS-CoV-2 target nucleic acids are NOT DETECTED. The SARS-CoV-2 RNA is generally detectable in upper and lower respiratory specimens during the acute phase of infection. Negative results do not preclude SARS-CoV-2 infection, do not rule out co-infections with other pathogens, and should not be used as the sole basis for treatment or other patient management decisions. Negative results must be combined with clinical observations, patient history, and epidemiological information. The expected result is Negative. Fact Sheet for Patients: SugarRoll.be Fact Sheet for Healthcare Providers: https://www.woods-mathews.com/ This test is not yet approved or cleared by the [place] FDA and  has been authorized for detection and/or  diagnosis of SARS-CoV-2 by FDA under an Emergency Use Authorization (EUA). This EUA will remain  in effect (meaning this test can be used) for the duration of the COVID-19 declaration under Section 56 4(b)(1) of the Act, 21 U.S.C. section 360bbb-3(b)(1), unless the authorization is terminated or revoked sooner. Performed at [facility], [place] [address], [place], [place] [address]   Aerobic/Anaerobic Culture (surgical/deep wound)     Status: None (Preliminary result)   Collection Time: 04/16/19  3:24 PM   Specimen: PATH Other; Wound  Result Value Ref Range Status   Specimen Description   Final    WOUND left ankle Performed at [place] [address], [place], [place] [address]    Special Requests   Final    NONE Performed at [facility], [place], [place] [address]    Gram Stain   Final    FEW WBC PRESENT, PREDOMINANTLY PMN RARE GRAM POSITIVE COCCI    Culture   Final    NO GROWTH < 12 HOURS Performed at [facility] [address] [place], [place] [address]    Report Status PENDING  Incomplete         Radiology Studies: No results found.      Scheduled  Meds: . amLODipine  10 mg Oral Daily  . aspirin EC  81 mg Oral Daily  . benazepril  20 mg Oral BID  . clopidogrel  75 mg Oral Daily  . DULoxetine  30 mg Oral QPM  . DULoxetine  60 mg Oral Daily  . ezetimibe  10 mg Oral Daily  . feeding supplement (PRO-STAT SUGAR FREE 64)  30 mL Oral Daily  . fenofibrate  54 mg Oral q1800  . furosemide  20 mg Oral Daily  . hydrochlorothiazide  12.5 mg Oral Daily  . insulin aspart  0-20 Units Subcutaneous TID WC  . insulin detemir  48 Units Subcutaneous QHS  . linaclotide  290 mcg Oral QAC breakfast  . multivitamin with minerals  1 tablet Oral Daily  . mupirocin ointment  1 application Nasal BID  . omega-3 acid ethyl esters  1 g Oral BID  . oxcarbazepine  600 mg Oral BID  . oxyCODONE  20 mg Oral Q12H  . pantoprazole  40 mg  Oral Daily  . potassium chloride  40 mEq Oral Once  . pregabalin  200 mg Oral Daily  . pregabalin  400 mg Oral Daily  . sodium chloride flush  10-40 mL Intracatheter Q12H  . tamsulosin  0.4 mg Oral q1800  . vitamin C  500 mg Oral BID   Continuous Infusions: .  ceFAZolin (ANCEF) IV 2 g (04/18/19 0700)     LOS: 8 days    Time spent: 30-35 minutes    Ezekiel Slocumb, DO [facility] Pager: [phone]  If 7PM-7AM, please contact night-coverage [email or web address] Password [email or web address] 04/18/2019, 8:10 AM

## 2019-04-18 NOTE — TOC Progression Note (Signed)
Transition of Care [facility]) - Progression Note    Patient Details  Name: Victor Wise MRN: [id number] Date of Birth: 11-09-1950  Transition of [facility]) CM/SW Contact  Su Hilt, RN Phone Number: 04/18/2019, 3:09 PM  Clinical Narrative:     Received a call from Doroteo Bradford at [facility] has denied approval for LTAC stay, Peer to Peer to be done no later than 12/4 at 3 PM [phone], ext [phone], ref number [id number] I notified Dr Mack Guise and Dr Francine Graven of the denial and the need for the peer to peer  Expected Discharge Plan: [place] Barriers to Discharge: Continued Medical Work up  Expected Discharge Plan and Services Expected Discharge Plan: [place]   Discharge Planning Services: CM Consult   Living arrangements for the past 2 months: [place]                 DME Arranged: N/A         HH Arranged: RN, PT, OT, Nurse's Aide, Social Work [facility] Agency: [facility] Date [place]: 04/15/19 Time [place]: 26 Representative spoke with at [place]: [place] ([place]) Interventions    Readmission Risk Interventions No flowsheet data found.

## 2019-04-18 NOTE — TOC Progression Note (Signed)
Transition of Care [facility]) - Progression Note    Patient Details  Name: CHRISTI LEVENTRY MRN: [id number] Date of Birth: [birth year]  Transition of Care [facility]) CM/SW [place], RN Phone Number: 04/18/2019, 12:37 PM  Clinical Narrative:    Damaris Schooner with Imagene Sheller at [facility] to check the status of the insurance authorization, they have not received approval yet   Expected Discharge Plan: [place] Barriers to Discharge: Continued Medical Work up  Expected Discharge Plan and Services Expected Discharge Plan: [place]   Discharge Planning Services: CM Consult   Living arrangements for the past 2 months: [place]                 DME Arranged: N/A         HH Arranged: RN, PT, OT, Nurse's Aide, Social Work [employer] Agency: [facility] Date [place]: 04/15/19 Time [place]: 46 Representative spoke with at [place]: [place] ([place]) Interventions    Readmission Risk Interventions No flowsheet data found.

## 2019-04-19 LAB — BASIC METABOLIC PANEL
Anion gap: 10 (ref 5–15)
BUN: 15 mg/dL (ref 8–23)
CO2: 27 mmol/L (ref 22–32)
Calcium: 8.8 mg/dL — ABNORMAL LOW (ref 8.9–10.3)
Chloride: 103 mmol/L (ref 98–111)
Creatinine, Ser: 0.81 mg/dL (ref 0.61–1.24)
GFR calc Af Amer: >60 mL/min (ref >60)
GFR calc non Af Amer: >60 mL/min (ref >60)
Glucose, Bld: 156 mg/dL — ABNORMAL HIGH (ref 70–99)
Potassium: 3.3 mmol/L — ABNORMAL LOW (ref 3.5–5.1)
Sodium: 140 mmol/L (ref 135–145)

## 2019-04-19 LAB — GLUCOSE, CAPILLARY
Glucose-Capillary: 180 mg/dL — ABNORMAL HIGH (ref 70–99)
Glucose-Capillary: 178 mg/dL — ABNORMAL HIGH (ref 70–99)

## 2019-04-19 MED ORDER — CHLORHEXIDINE GLUCONATE CLOTH 2 % EX PADS
6.0000 | MEDICATED_PAD | Freq: Every day | CUTANEOUS | Status: DC
  Administered 2019-04-19 – 2019-04-23 (×4): 6 via TOPICAL

## 2019-04-19 MED ORDER — POTASSIUM CHLORIDE CRYS ER 20 MEQ PO TBCR
40.0000 meq | EXTENDED_RELEASE_TABLET | Freq: Once | ORAL | Status: AC
  Administered 2019-04-19: 09:00:00 40 meq via ORAL
  Filled 2019-04-19: qty 2

## 2019-04-19 NOTE — Progress Notes (Signed)
OT Cancellation Note  Patient Details Name: Victor Wise MRN: [id number] DOB: 1950-12-15   Cancelled Treatment:    Reason Eval/Treat Not Completed: Fatigue/lethargy limiting ability to participate. Upon attempt, pt sleeping soundly, does not wake to cues. Will re-attempt as able at later date/time.   Jeni Salles, MPH, MS, OTR/L ascom [phone] 04/19/19, 11:30 AM

## 2019-04-19 NOTE — Progress Notes (Addendum)
Subjective:  POD #7 s/p left lateral ankle wound I&D, hardware removal and VAC dressing placement.  Postop day #3 status post placement of ACell patch and VAC dressing change.   Patient is drowsy but arousable today.  Patient reports left ankle pain as mild.    Objective:   VITALS:   Vitals:   04/18/19 0827 04/18/19 1630 04/18/19 2334 04/19/19 0844  BP: 113/74 131/80 139/66 140/66  Pulse: 61 62 62 60  Resp: 17 17 17 17   Temp: 98.6 F (37 C) 98 F (36.7 C) 99.2 F (37.3 C) 98.1 F (36.7 C)  TempSrc: Oral Oral Oral Oral  SpO2: 97% 97% 96% 98%  Weight:      Height:        PHYSICAL EXAM: Left lower extremity: VAC dressing and cast are in place.  The VAC dressing is functioning.  Patient's toes remain perfused.   LABS  Results for orders placed or performed during the hospital encounter of 04/10/19 (from the past 24 hour(s))  Glucose, capillary     Status: Abnormal   Collection Time: 04/18/19  4:35 PM  Result Value Ref Range   Glucose-Capillary 115 (H) 70 - 99 mg/dL   Comment 1 Notify RN   Glucose, capillary     Status: Abnormal   Collection Time: 04/18/19  9:13 PM  Result Value Ref Range   Glucose-Capillary 117 (H) 70 - 99 mg/dL   Comment 1 Notify RN   Basic metabolic panel     Status: Abnormal   Collection Time: 04/19/19  5:00 AM  Result Value Ref Range   Sodium 140 135 - 145 mmol/L   Potassium 3.3 (L) 3.5 - 5.1 mmol/L   Chloride 103 98 - 111 mmol/L   CO2 27 22 - 32 mmol/L   Glucose, Bld 156 (H) 70 - 99 mg/dL   BUN 15 8 - 23 mg/dL   Creatinine, Ser 0.81 0.61 - 1.24 mg/dL   Calcium 8.8 (L) 8.9 - 10.3 mg/dL   GFR calc non Af Amer >60 >60 mL/min   GFR calc Af Amer >60 >60 mL/min   Anion gap 10 5 - 15  Glucose, capillary     Status: Abnormal   Collection Time: 04/19/19  8:46 AM  Result Value Ref Range   Glucose-Capillary 180 (H) 70 - 99 mg/dL   Comment 1 Notify RN   Glucose, capillary     Status: Abnormal   Collection Time: 04/19/19 11:53 AM  Result Value Ref  Range   Glucose-Capillary 178 (H) 70 - 99 mg/dL   Comment 1 Notify RN     No results found.  Assessment/Plan: 3 Days Post-Op   Principal Problem:   Infected hardware in left lower extremity (HCC) Active Problems:   Mixed hyperlipidemia   Essential hypertension   OSA on CPAP   Benign essential hypertension   Coronary artery disease due to lipid rich plaque   Type 2 diabetes mellitus with hyperlipidemia (HCC)   Benign prostatic hyperplasia without lower urinary tract symptoms   Obesity, Class III, BMI 40-49.9 (morbid obesity) ([place])   Bimalleolar fracture of left ankle   Wound dehiscence, surgical   Pyogenic inflammation of bone (HCC)   Preop examination   Pressure injury of skin   Anemia   Hypokalemia   Diabetic polyneuropathy associated with type 2 diabetes mellitus (HCC)   Moderate episode of recurrent major depressive disorder ([place])  Patient will continue IV antibiotics and VAC dressing.  I had called for peer-to-peer review for  the patient to be approved for LTAC.  Physician was not at work yet at 8:30 this morning and my phone number was left for the physician to call me back.  I was told by the [place] that I would be contacted by the physician at her convenience either today or Monday.  Patient will remain in the hospital for his current treatment until approval for LTAC.  If he is not approved for LTAC he will remain in the hospital long-term for continued IV antibiotics and VAC dressing treatment as the patient is unable to afford outpatient treatment.    [place] , MD 04/19/2019, 1:29 PM

## 2019-04-19 NOTE — TOC Progression Note (Signed)
Transition of Care [facility]) - Progression Note    Patient Details  Name: Victor Wise MRN: [id number] Date of Birth: May 22, 1950  Transition of Care [facility]) CM/SW [place], RN Phone Number: 04/19/2019, 8:12 AM  Clinical Narrative:     Spoke with the patient's daughter, she stated that she is concerned for him.  I explained that we are trying to get insurance authorization approved for him to go to [facility], she stated if it is denied they will appeal it, that is how they have had to get things approved before.  I explained that he will have a copay that he stated that he can not pay for both the IV ABX as well as the Wound Vac, she stated that she could not help with that either and hopes that they can get approval for insurance to go to [facility], I will call her with any update  Expected Discharge Plan: [place] Barriers to Discharge: Continued Medical Work up  Expected Discharge Plan and Services Expected Discharge Plan: [place]   Discharge Planning Services: CM Consult   Living arrangements for the past 2 months: [place]                 DME Arranged: N/A         HH Arranged: RN, PT, OT, Nurse's Aide, Social Work [employer] Agency: [facility] Date [place]: 04/15/19 Time [place]: 1412 Representative spoke with at [place]: [place]) Interventions    Readmission Risk Interventions No flowsheet data found.

## 2019-04-19 NOTE — Progress Notes (Signed)
PROGRESS NOTE    Victor Wise  [id number] DOB: [birth year] DOA: 04/10/2019  PCP: Lavera Guise, MD    LOS - 9   Brief Narrative:  68 y.o.male,admitted to orthopedic service forleft ankle wound dehiscence which began 7 weeks postop when the patient was converted from a cast to a walking boot.Hospitalists consulted for IV antibiotics and other medical management. Infectious disease following.  Will continue on Ancef until Jan 10th, and will have continued need for wound vac as well.  SW involved for LTAC placement as patient has medical needs for next several weeks that cannot be addressed at home.   Subjective 12/4: Patient again with blanket over his head, but was awake and uncovered his head during encounter.  States 8 out of 10 pain but it does become tolerable when medicine sets in.  Reports constipation.  No acute events reported overnight.   Assessment & Plan:   Principal Problem:   Infected hardware in left lower extremity (HCC) Active Problems:   Mixed hyperlipidemia   Essential hypertension   OSA on CPAP   Benign essential hypertension   Coronary artery disease due to lipid rich plaque   Type 2 diabetes mellitus with hyperlipidemia (HCC)   Benign prostatic hyperplasia without lower urinary tract symptoms   Obesity, Class III, BMI 40-49.9 (morbid obesity) ([place])   Bimalleolar fracture of left ankle   Wound dehiscence, surgical   Pyogenic inflammation of bone (HCC)   Preop examination   Pressure injury of skin   Anemia   Hypokalemia   Diabetic polyneuropathy associated with type 2 diabetes mellitus (HCC)   Moderate episode of recurrent major depressive disorder (HCC)   Left ankle osteomyelitis with hardware infection -Orthopedics is primary -Infectious disease following -PICC line placed 12/1 -Continue Ancef 2 g IV every 8 hours for 6-week(course to end January 10) -Weekly labs to include CBC with differential, CMP, ESR while on IV antibiotics -Wound  care -ID follow-up in clinic in 4 weeks -Nonweightbearing on the left lower extremity -Maintain wound VAC --Ortho has ordered psych consult at pt refusing outpatient antibiotics and VAC dressing, suspect depression or other psych condition and lack of insight to his condition.  Patient aware he could lose lower leg if condition not treated properly.  Psych impression is this is situational depression, no medications recommended. --SW awaiting insurance approval for LTAC.  Type 2 diabetes with hyperglycemia Hyperlipidemia Peripheral neuropathy Prior toe amputations -Levemir 48 units nightly -Sliding scale insulin -Continue Zetia, fenofibrate  Essential hypertension-chronic, stable -Continue amlodipine, benazepril, HCTZ, Lasix  Chronic systolic CHF, EF 99% -No signs of acute CHF -Continue home ACE and Lasix  UTI-urine culture grew E. coli, sensitive to Ancef  Hypokalemia-resolved -Monitor and replete as needed  Postop anemia-stable with hemoglobin 8.0 and no signs of bleeding -Monitor CBC -Transfuse if hemoglobin less than 7  Obstructive sleep apnea-continue CPAP  Coronary artery disease-continue aspirin and Plavix  DVT prophylaxis:SCDs Code Status: Full Code Family Communication:None at bedside Disposition Plan:Likely home with home health, expect medically ready tomorrow. Pending clearance by Ortho   Consultants:  Ortho is primary  Infectious disease  Procedures:  12/1 -wound VAC replacement  Antimicrobials:  Ancef-end date January10,2021    Objective: Vitals:   04/18/19 0148 04/18/19 0827 04/18/19 1630 04/18/19 2334  BP: 124/73 113/74 131/80 139/66  Pulse: 61 61 62 62  Resp: 17 17 17 17   Temp: 98.5 F (36.9 C) 98.6 F (37 C) 98 F (36.7 C) 99.2 F (37.3 C)  TempSrc:  Oral Oral Oral Oral  SpO2: 93% 97% 97% 96%  Weight:      Height:        Intake/Output Summary (Last 24 hours) at 04/19/2019 0754 Last data filed  at 04/18/2019 2330 Gross per 24 hour  Intake -  Output 1440 ml  Net -1440 ml   Filed Weights   04/10/19 0840  Weight: 122.5 kg    Examination:  General exam: awake, alert, no acute distress Respiratory system: clear to auscultation bilaterally, no wheezes, rales or rhonchi, normal respiratory effort. Cardiovascular system: normal S1/S2, RRR, no JVD, murmurs, rubs, gallops, no pedal edema.   Gastrointestinal system: soft, non-tender, non-distended abdomen, normal bowel sounds. Central nervous system: alert and oriented x4. no gross focal neurologic deficits, normal speech Extremities: left lower extremity in cast, distal sensation intact, no edema, no cyanosis Skin: dry, intact, normal temperature Psychiatry: depressed mood, flat affect, judgement and insight appear normal    Data Reviewed: I have personally reviewed following labs and imaging studies  CBC: Recent Labs  Lab 04/13/19 0544 04/14/19 0729 04/15/19 0455 04/16/19 0418 04/18/19 0452  WBC 4.4 3.6*  --   --  4.2  HGB 8.0* 7.9* 7.9* 8.0* 8.5*  HCT 24.9* 25.4*  --   --  27.2*  MCV 76.9* 78.2*  --   --  77.9*  PLT 241 209  --   --  832   Basic Metabolic Panel: Recent Labs  Lab 04/13/19 0544 04/14/19 0729 04/15/19 0455 04/16/19 0418 04/18/19 0452 04/19/19 0500  NA 142  --   --  140 142 140  K 3.4*  --   --  3.4* 3.3* 3.3*  CL 107  --   --  105 106 103  CO2 25  --   --  28 27 27   GLUCOSE 156*  --   --  212* 151* 156*  BUN 15  --   --  18 12 15   CREATININE 0.70 0.91 0.93 0.84 0.81 0.81  CALCIUM 8.6*  --   --  8.7* 8.9 8.8*   GFR: Estimated Creatinine Clearance: 112.7 mL/min (by C-G formula based on SCr of 0.81 mg/dL). Liver Function Tests: No results for input(s): AST, ALT, ALKPHOS, BILITOT, PROT, ALBUMIN in the last 168 hours. No results for input(s): LIPASE, AMYLASE in the last 168 hours. No results for input(s): AMMONIA in the last 168 hours. Coagulation Profile: No results for input(s): INR,  PROTIME in the last 168 hours. Cardiac Enzymes: No results for input(s): CKTOTAL, CKMB, CKMBINDEX, TROPONINI in the last 168 hours. BNP (last 3 results) No results for input(s): PROBNP in the last 8760 hours. HbA1C: No results for input(s): HGBA1C in the last 72 hours. CBG: Recent Labs  Lab 04/17/19 2202 04/18/19 0825 04/18/19 1213 04/18/19 1635 04/18/19 2113  GLUCAP 130* 146* 121* 115* 117*   Lipid Profile: No results for input(s): CHOL, HDL, LDLCALC, TRIG, CHOLHDL, LDLDIRECT in the last 72 hours. Thyroid Function Tests: No results for input(s): TSH, T4TOTAL, FREET4, T3FREE, THYROIDAB in the last 72 hours. Anemia Panel: No results for input(s): VITAMINB12, FOLATE, FERRITIN, TIBC, IRON, RETICCTPCT in the last 72 hours. Sepsis Labs: No results for input(s): PROCALCITON, LATICACIDVEN in the last 168 hours.  Recent Results (from the past 240 hour(s))  Urine culture     Status: Abnormal   Collection Time: 04/10/19 11:28 AM   Specimen: Urine, Random  Result Value Ref Range Status   Specimen Description   Final    URINE, RANDOM Performed at  [facility], [address] [place], [place] [address]    Special Requests   Final    NONE Performed at [facility], [address] [place], [place] [address]    Culture >=100,000 COLONIES/mL ESCHERICHIA COLI (A)  Final   Report Status 04/12/2019 FINAL  Final   Organism ID, Bacteria ESCHERICHIA COLI (A)  Final      Susceptibility   Escherichia coli - MIC*    AMPICILLIN 16 INTERMEDIATE Intermediate     CEFAZOLIN <=4 SENSITIVE Sensitive     CEFTRIAXONE <=1 SENSITIVE Sensitive     CIPROFLOXACIN >=4 RESISTANT Resistant     GENTAMICIN <=1 SENSITIVE Sensitive     IMIPENEM <=0.25 SENSITIVE Sensitive     NITROFURANTOIN 32 SENSITIVE Sensitive     TRIMETH/SULFA <=20 SENSITIVE Sensitive     AMPICILLIN/SULBACTAM 4 SENSITIVE Sensitive     PIP/TAZO 8 SENSITIVE Sensitive     Extended ESBL NEGATIVE Sensitive     * >=100,000  COLONIES/mL ESCHERICHIA COLI  Surgical pcr screen     Status: Abnormal   Collection Time: 04/12/19  7:10 AM   Specimen: Nasal Mucosa; Nasal Swab  Result Value Ref Range Status   MRSA, PCR NEGATIVE NEGATIVE Final   Staphylococcus aureus POSITIVE (A) NEGATIVE Final    Comment: (NOTE) The Xpert SA Assay (FDA approved for NASAL specimens in patients 27 years of age and older), is one component of a comprehensive surveillance program. It is not intended to diagnose infection nor to guide or monitor treatment. Performed at [facility] [address] [place], [place] [address]   Aerobic/Anaerobic Culture (surgical/deep wound)     Status: None   Collection Time: 04/12/19  8:46 AM   Specimen: PATH Cytology Misc. fluid; Body Fluid  Result Value Ref Range Status   Specimen Description   Final    WOUND LEFT FIBULA Performed at [facility], [address] [place], [place] [address]    Special Requests   Final    NONE Performed at [facility], [address] [place], [place] [address]    Gram Stain NO WBC SEEN NO ORGANISMS SEEN   Final   Culture   Final    FEW STAPHYLOCOCCUS AUREUS NO ANAEROBES ISOLATED Performed at [facility], [place] [address], [place], [place] [address]    Report Status 04/17/2019 FINAL  Final   Organism ID, Bacteria STAPHYLOCOCCUS AUREUS  Final      Susceptibility   Staphylococcus aureus - MIC*    CIPROFLOXACIN >=8 RESISTANT Resistant     ERYTHROMYCIN <=0.25 SENSITIVE Sensitive     GENTAMICIN <=0.5 SENSITIVE Sensitive     OXACILLIN <=0.25 SENSITIVE Sensitive     TETRACYCLINE <=1 SENSITIVE Sensitive     VANCOMYCIN <=0.5 SENSITIVE Sensitive     TRIMETH/SULFA <=10 SENSITIVE Sensitive     CLINDAMYCIN <=0.25 SENSITIVE Sensitive     RIFAMPIN <=0.5 SENSITIVE Sensitive     Inducible Clindamycin NEGATIVE Sensitive     * FEW STAPHYLOCOCCUS AUREUS  Aerobic/Anaerobic Culture (surgical/deep wound)     Status: None   Collection  Time: 04/12/19  9:01 AM   Specimen: PATH Cytology Misc. fluid; Body Fluid  Result Value Ref Range Status   Specimen Description   Final    WOUND Performed at [facility], [address], [place], [place] [address]    Special Requests   Final    LEFT ANKLE JOINT FLUID ASPIRATION Performed at [facility], [place],  [place] [address]    Gram Stain NO WBC SEEN NO ORGANISMS SEEN   Final   Culture   Final    RARE STAPHYLOCOCCUS AUREUS NO ANAEROBES ISOLATED Performed at [facility], [place] [address] [place], [place] [address]    Report Status 04/17/2019 FINAL  Final   Organism ID, Bacteria STAPHYLOCOCCUS AUREUS  Final      Susceptibility   Staphylococcus aureus - MIC*    CIPROFLOXACIN >=8 RESISTANT Resistant     ERYTHROMYCIN <=0.25 SENSITIVE Sensitive     GENTAMICIN <=0.5 SENSITIVE Sensitive     OXACILLIN <=0.25 SENSITIVE Sensitive     TETRACYCLINE <=1 SENSITIVE Sensitive     VANCOMYCIN <=0.5 SENSITIVE Sensitive     TRIMETH/SULFA <=10 SENSITIVE Sensitive     CLINDAMYCIN <=0.25 SENSITIVE Sensitive     RIFAMPIN <=0.5 SENSITIVE Sensitive     Inducible Clindamycin NEGATIVE Sensitive     * RARE STAPHYLOCOCCUS AUREUS  Aerobic/Anaerobic Culture (surgical/deep wound)     Status: None   Collection Time: 04/12/19  9:01 AM   Specimen: PATH Soft tissue  Result Value Ref Range Status   Specimen Description   Final    TISSUE Performed at [facility], [place] [address] [place], [place] [address]    Special Requests   Final    WOUND TISSUE LEFT ANKLE Performed at [facility], [address] [place], [place] [address]    Gram Stain   Final    MODERATE WBC PRESENT, PREDOMINANTLY PMN RARE GRAM POSITIVE COCCI    Culture   Final    FEW STAPHYLOCOCCUS AUREUS NO ANAEROBES ISOLATED Performed at [facility], [place] [address], [place], [place] [address]    Report Status 04/17/2019 FINAL  Final   Organism ID, Bacteria  STAPHYLOCOCCUS AUREUS  Final      Susceptibility   Staphylococcus aureus - MIC*    CIPROFLOXACIN >=8 RESISTANT Resistant     ERYTHROMYCIN <=0.25 SENSITIVE Sensitive     GENTAMICIN <=0.5 SENSITIVE Sensitive     OXACILLIN <=0.25 SENSITIVE Sensitive     TETRACYCLINE <=1 SENSITIVE Sensitive     VANCOMYCIN <=0.5 SENSITIVE Sensitive     TRIMETH/SULFA <=10 SENSITIVE Sensitive     CLINDAMYCIN <=0.25 SENSITIVE Sensitive     RIFAMPIN <=0.5 SENSITIVE Sensitive     Inducible Clindamycin NEGATIVE Sensitive     * FEW STAPHYLOCOCCUS AUREUS  Aerobic/Anaerobic Culture (surgical/deep wound)     Status: None   Collection Time: 04/12/19  9:15 AM   Specimen: PATH Cytology Misc. fluid; Body Fluid  Result Value Ref Range Status   Specimen Description   Final    WOUND Performed at [facility], [address] [place], [place] [address]    Special Requests   Final    WOUND CULTURE LEFT ANKLE Performed at [facility], [address] [place], [place] [address]    Gram Stain NO WBC SEEN NO ORGANISMS SEEN   Final   Culture   Final    FEW STAPHYLOCOCCUS AUREUS NO ANAEROBES ISOLATED Performed at [facility], [place] [address], [place], [place] [address]    Report Status 04/17/2019 FINAL  Final   Organism ID, Bacteria STAPHYLOCOCCUS AUREUS  Final      Susceptibility   Staphylococcus aureus - MIC*    CIPROFLOXACIN >=8 RESISTANT Resistant     ERYTHROMYCIN <=0.25 SENSITIVE Sensitive     GENTAMICIN <=0.5 SENSITIVE Sensitive     OXACILLIN <=0.25 SENSITIVE Sensitive  TETRACYCLINE <=1 SENSITIVE Sensitive     VANCOMYCIN 1 SENSITIVE Sensitive     TRIMETH/SULFA <=10 SENSITIVE Sensitive     CLINDAMYCIN <=0.25 SENSITIVE Sensitive     RIFAMPIN <=0.5 SENSITIVE Sensitive     Inducible Clindamycin NEGATIVE Sensitive     * FEW STAPHYLOCOCCUS AUREUS  SARS CORONAVIRUS 2 (TAT 6-24 HRS) Nasopharyngeal Nasopharyngeal Swab     Status: None   Collection Time: 04/16/19  9:56 AM   Specimen:  Nasopharyngeal Swab  Result Value Ref Range Status   SARS Coronavirus 2 NEGATIVE NEGATIVE Final    Comment: (NOTE) SARS-CoV-2 target nucleic acids are NOT DETECTED. The SARS-CoV-2 RNA is generally detectable in upper and lower respiratory specimens during the acute phase of infection. Negative results do not preclude SARS-CoV-2 infection, do not rule out co-infections with other pathogens, and should not be used as the sole basis for treatment or other patient management decisions. Negative results must be combined with clinical observations, patient history, and epidemiological information. The expected result is Negative. Fact Sheet for Patients: SugarRoll.be Fact Sheet for Healthcare Providers: https://www.woods-mathews.com/ This test is not yet approved or cleared by the [place] FDA and  has been authorized for detection and/or diagnosis of SARS-CoV-2 by FDA under an Emergency Use Authorization (EUA). This EUA will remain  in effect (meaning this test can be used) for the duration of the COVID-19 declaration under Section 56 4(b)(1) of the Act, 21 U.S.C. section 360bbb-3(b)(1), unless the authorization is terminated or revoked sooner. Performed at [facility], [place] [address], [place], [place] [address]   Aerobic/Anaerobic Culture (surgical/deep wound)     Status: None (Preliminary result)   Collection Time: 04/16/19  3:24 PM   Specimen: PATH Other; Wound  Result Value Ref Range Status   Specimen Description   Final    WOUND left ankle Performed at [place] [address] [place], [place] [address]    Special Requests   Final    NONE Performed at [facility], [place], [place] [address]    Gram Stain   Final    FEW WBC PRESENT, PREDOMINANTLY PMN RARE GRAM POSITIVE COCCI Performed at [facility], [place] [address] [place], [place] [address]    Culture   Final    RARE  STAPHYLOCOCCUS AUREUS SUSCEPTIBILITIES TO FOLLOW NO ANAEROBES ISOLATED; CULTURE IN PROGRESS FOR 5 DAYS    Report Status PENDING  Incomplete         Radiology Studies: No results found.      Scheduled Meds: . amLODipine  10 mg Oral Daily  . aspirin EC  81 mg Oral Daily  . benazepril  20 mg Oral BID  . clopidogrel  75 mg Oral Daily  . DULoxetine  30 mg Oral QPM  . DULoxetine  60 mg Oral Daily  . ezetimibe  10 mg Oral Daily  . feeding supplement (PRO-STAT SUGAR FREE 64)  30 mL Oral Daily  . fenofibrate  54 mg Oral q1800  . furosemide  20 mg Oral Daily  . hydrochlorothiazide  12.5 mg Oral Daily  . insulin aspart  0-20 Units Subcutaneous TID WC  . insulin detemir  48 Units Subcutaneous QHS  . linaclotide  290 mcg Oral QAC breakfast  . multivitamin with minerals  1 tablet Oral Daily  . mupirocin ointment  1 application Nasal BID  . omega-3 acid ethyl esters  1 g Oral BID  . oxcarbazepine  600 mg Oral BID  .  oxyCODONE  20 mg Oral Q12H  . pantoprazole  40 mg Oral Daily  . potassium chloride  40 mEq Oral Once  . pregabalin  200 mg Oral Daily  . pregabalin  400 mg Oral Daily  . sodium chloride flush  10-40 mL Intracatheter Q12H  . tamsulosin  0.4 mg Oral q1800  . vitamin C  500 mg Oral BID   Continuous Infusions: .  ceFAZolin (ANCEF) IV 2 g (04/19/19 0519)     LOS: 9 days    Time spent: 30-35 minutes    Ezekiel Slocumb, DO [facility] Pager: [phone]  If 7PM-7AM, please contact night-coverage [email or web address] Password [email or web address] 04/19/2019, 7:54 AM

## 2019-04-19 NOTE — Care Management Important Message (Signed)
Important Message  Patient Details  Name: Victor Wise MRN: [id number] Date of Birth: [birth year]   Medicare Important Message Given:  Yes     Su Hilt, RN 04/19/2019, 12:25 PM

## 2019-04-19 NOTE — Progress Notes (Signed)
Physical Therapy Treatment Patient Details Name: Victor Wise MRN: [id number] DOB: [birth year] Today's Date: 04/19/2019    History of Present Illness Pt. is a 68 yo male who was readmitted from SNF with a gradually declining situation on L ankle, with wound dehiscence 7 weeks post op, after going to a walking boot.  Wound declined, then was taken to surgery on 11/27, with I and D, removal of L fibular hardware and exchange of wound vac.  Pt is NWB.  PMHx:  pacemaker, CAD, stent, DM, depression, GERD, HTN    PT Comments    Pt presented with deficits in strength.  Pt transfers, mobility, gait, balance, and activity tolerance unable to be assessed secondary to pt declining all but supine therex and rolling left/right.  Pt actively participated with below activities but declined to attempt sitting at EOB.  Pt education provided on physiological benefits of increased activity levels.  Pt will benefit from PT services in a SNF setting upon discharge to safely address above deficits for decreased caregiver assistance and eventual return to PLOF.     Follow Up Recommendations  SNF     Equipment Recommendations       Recommendations for Other Services       Precautions / Restrictions Precautions Precautions: Fall Restrictions Weight Bearing Restrictions: Yes LLE Weight Bearing: Non weight bearing    Mobility  Bed Mobility               General bed mobility comments: Pt participated with rolling therex left/right but refused attempt at sitting at EOB  Transfers                 General transfer comment: Pt refused attempt  Ambulation/Gait                 Stairs             Wheelchair Mobility    Modified Rankin (Stroke Patients Only)       Balance                                            Cognition Arousal/Alertness: Awake/alert Behavior During Therapy: Flat affect Overall Cognitive Status: Within Functional Limits for tasks  assessed                                        Exercises Total Joint Exercises Ankle Circles/Pumps: Strengthening;Right;10 reps;15 reps Quad Sets: Strengthening;Both;10 reps;15 reps Gluteal Sets: Strengthening;Both;10 reps;15 reps Towel Squeeze: Strengthening;Both;10 reps Short Arc Quad: Strengthening;Both;10 reps(manual resistance on the RLE) Heel Slides: AROM;Right;10 reps;5 reps Hip ABduction/ADduction: Strengthening;Both;10 reps;5 reps Straight Leg Raises: Strengthening;Both;10 reps;5 reps Other Exercises: Rolling left/right 2 x 10 with emphasis on core activation Other Exercises: HEP education for RLE APs and BLE QS and GS x 10 each every 1-2 hours daily    General Comments        Pertinent Vitals/Pain Pain Assessment: 0-10 Pain Score: 8  Pain Location: Left Ankle Pain Descriptors / Indicators: Operative site guarding;Grimacing;Aching Pain Intervention(s): Premedicated before session;Monitored during session;Limited activity within patient's tolerance    Home Living                      Prior Function  PT Goals (current goals can now be found in the care plan section) Progress towards PT goals: Not progressing toward goals - comment(Unable to assess secondary to pt limiting participation)    Frequency    Min 2X/week      PT Plan Current plan remains appropriate    Co-evaluation              AM-PAC PT "6 Clicks" Mobility   Outcome Measure  Help needed turning from your back to your side while in a flat bed without using bedrails?: A Little Help needed moving from lying on your back to sitting on the side of a flat bed without using bedrails?: A Little Help needed moving to and from a bed to a chair (including a wheelchair)?: A Lot Help needed standing up from a chair using your arms (e.g., wheelchair or bedside chair)?: A Lot Help needed to walk in hospital room?: Total Help needed climbing 3-5 steps with a  railing? : Total 6 Click Score: 12    End of Session   Activity Tolerance: Patient limited by fatigue;Patient limited by pain Patient left: in bed;with call bell/phone within reach;with bed alarm set Nurse Communication: Mobility status PT Visit Diagnosis: Muscle weakness (generalized) (M62.81);Pain Pain - Right/Left: Left Pain - part of body: Leg;Ankle and joints of foot     Time: [id number] PT Time Calculation (min) (ACUTE ONLY): 25 min  Charges:  $Therapeutic Exercise: 23-37 mins                     D. Scott Haniya Fern PT, DPT 04/19/19, 5:24 PM

## 2019-04-19 NOTE — Progress Notes (Signed)
PHARMACY CONSULT NOTE FOR:  OUTPATIENT  PARENTERAL ANTIBIOTIC THERAPY (OPAT)  Indication: MSSA ankle infection Regimen: cefazolin 2gm IV q8h End date: 05/26/2019  IV antibiotic discharge orders are pended. To discharging provider:  please sign these orders via discharge navigator,  Select New Orders & click on the button choice - Manage This Unsigned Work.    Pharmacy complete OPAT note on 04/17/2019. At this time, it is unclear when the patient will be discharged. Pharmacy will recheck OPAT order pending discharge.   Thank you for allowing pharmacy to be a part of this patient's care.  Kristeen Miss, PharmD Clinical Pharmacist 04/19/2019 1:53 PM

## 2019-04-20 LAB — GLUCOSE, CAPILLARY
Glucose-Capillary: 141 mg/dL — ABNORMAL HIGH (ref 70–99)
Glucose-Capillary: 195 mg/dL — ABNORMAL HIGH (ref 70–99)
Glucose-Capillary: 190 mg/dL — ABNORMAL HIGH (ref 70–99)

## 2019-04-20 LAB — BASIC METABOLIC PANEL
Anion gap: 8 (ref 5–15)
BUN: 20 mg/dL (ref 8–23)
CO2: 28 mmol/L (ref 22–32)
Calcium: 8.8 mg/dL — ABNORMAL LOW (ref 8.9–10.3)
Chloride: 104 mmol/L (ref 98–111)
Creatinine, Ser: 0.81 mg/dL (ref 0.61–1.24)
GFR calc Af Amer: >60 mL/min (ref >60)
GFR calc non Af Amer: >60 mL/min (ref >60)
Glucose, Bld: 143 mg/dL — ABNORMAL HIGH (ref 70–99)
Potassium: 3.2 mmol/L — ABNORMAL LOW (ref 3.5–5.1)
Sodium: 140 mmol/L (ref 135–145)

## 2019-04-20 LAB — MAGNESIUM: Magnesium: 1.7 mg/dL (ref 1.7–2.4)

## 2019-04-20 MED ORDER — POTASSIUM CHLORIDE CRYS ER 20 MEQ PO TBCR
40.0000 meq | EXTENDED_RELEASE_TABLET | ORAL | Status: AC
  Administered 2019-04-20 (×2): 40 meq via ORAL
  Filled 2019-04-20 (×2): qty 2

## 2019-04-20 NOTE — Plan of Care (Signed)

## 2019-04-20 NOTE — Progress Notes (Signed)
PROGRESS NOTE    Victor Wise  VHQ:[id number] DOB: 04-02-1951 DOA: 04/10/2019  PCP: Lavera Guise, MD    LOS - 10   Brief Narrative:  68 y.o.male,admitted to orthopedic service forleft ankle wound dehiscence which began 7 weeks postop when the patient was converted from a cast to a walking boot.Hospitalists consulted for IV antibiotics and other medical management. Infectious disease following.  Will continue on Ancef until Jan 10th, and will have continued need for wound vac as well.  SW involved for LTAC placement as patient has medical needs for next several weeks that cannot be addressed at home safely.  Subjective 12/5: Patient again resting with blanket over his face, arouses to voice and removes blanket for encounter.  Reports significant pain but had just received medication.  No acute events reported overnight.  He denies any acute complaints including fevers or chills.  Assessment & Plan:   Principal Problem:   Infected hardware in left lower extremity (HCC) Active Problems:   Mixed hyperlipidemia   Essential hypertension   OSA on CPAP   Benign essential hypertension   Coronary artery disease due to lipid rich plaque   Type 2 diabetes mellitus with hyperlipidemia (HCC)   Benign prostatic hyperplasia without lower urinary tract symptoms   Obesity, Class III, BMI 40-49.9 (morbid obesity) ([place])   Bimalleolar fracture of left ankle   Wound dehiscence, surgical   Pyogenic inflammation of bone (HCC)   Preop examination   Pressure injury of skin   Anemia   Hypokalemia   Diabetic polyneuropathy associated with type 2 diabetes mellitus (HCC)   Moderate episode of recurrent major depressive disorder (HCC)   Left ankle osteomyelitis with hardware infection -Orthopedics is primary -Infectious disease following -PICC line placed 12/1 -Continue Ancef 2 g IV every 8 hours for 6-week(course to end January 10) -Weekly labs to include CBC with differential, CMP, ESR while  on IV antibiotics -Wound care -ID follow-up in clinic in 4 weeks -Nonweightbearing on the left lower extremity -Maintain wound VAC --Ortho has ordered psych consult at pt refusing outpatient antibiotics and VAC dressing, suspect depression or other psych condition and lack of insight to his condition. Patient aware he could lose lower leg if condition not treated properly.  Psych impression is this is situational depression, no medications recommended. --SW awaiting insurance approval for LTAC.  Type 2 diabetes with hyperglycemia Hyperlipidemia Peripheral neuropathy Prior toe amputations -Levemir 48 units nightly -Sliding scale insulin -Continue Zetia, fenofibrate  Essential hypertension-chronic, stable -Continue amlodipine, benazepril, HCTZ, Lasix  Chronic systolic CHF, EF 41% -No signs of acute CHF -Continue home ACE and Lasix  UTI-urine culture grew E. coli, sensitive to Ancef  Hypokalemia-resolved -Monitor and replete as needed  Postop anemia-stable with hemoglobin 8.0 and no signs of bleeding -Monitor CBC -Transfuse if hemoglobin less than 7  Obstructive sleep apnea-continue CPAP  Coronary artery disease-continue aspirin and Plavix  DVT prophylaxis:SCDs Code Status: Full Code Family Communication:None at bedside Disposition Plan:Likely home with home health, expect medically ready tomorrow. Pending clearance by Ortho   Consultants:  Ortho is primary  Infectious disease  Procedures:  12/1 -wound VAC replacement  Antimicrobials:  Ancef-end date January10,2021    Objective: Vitals:   04/18/19 2334 04/19/19 0844 04/19/19 1801 04/19/19 2305  BP: 139/66 140/66 123/72 120/69  Pulse: 62 60 61 60  Resp: 17 17 18 18   Temp: 99.2 F (37.3 C) 98.1 F (36.7 C) 98.2 F (36.8 C) 97.6 F (36.4 C)  TempSrc: Oral Oral  Oral Oral  SpO2: 96% 98% 98% 100%  Weight:      Height:        Intake/Output Summary (Last 24 hours) at  04/20/2019 0817 Last data filed at 04/20/2019 0640 Gross per 24 hour  Intake 796.77 ml  Output 1050 ml  Net -253.23 ml   Filed Weights   04/10/19 0840  Weight: 122.5 kg    Examination:  General exam: awake, alert, no acute distress, obese Respiratory system: Normal and symmetric chest rise, normal respiratory effort. Cardiovascular system: Regular rate and rhythm, 2+ radial pulses bilaterally Psychiatry: normal mood, flat affect    Data Reviewed: I have personally reviewed following labs and imaging studies  CBC: Recent Labs  Lab 04/14/19 0729 04/15/19 0455 04/16/19 0418 04/18/19 0452  WBC 3.6*  --   --  4.2  HGB 7.9* 7.9* 8.0* 8.5*  HCT 25.4*  --   --  27.2*  MCV 78.2*  --   --  77.9*  PLT 209  --   --  696   Basic Metabolic Panel: Recent Labs  Lab 04/15/19 0455 04/16/19 0418 04/18/19 0452 04/19/19 0500 04/20/19 0548  NA  --  140 142 140 140  K  --  3.4* 3.3* 3.3* 3.2*  CL  --  105 106 103 104  CO2  --  28 27 27 28   GLUCOSE  --  212* 151* 156* 143*  BUN  --  18 12 15 20   CREATININE 0.93 0.84 0.81 0.81 0.81  CALCIUM  --  8.7* 8.9 8.8* 8.8*  MG  --   --   --   --  1.7   GFR: Estimated Creatinine Clearance: 112.7 mL/min (by C-G formula based on SCr of 0.81 mg/dL). Liver Function Tests: No results for input(s): AST, ALT, ALKPHOS, BILITOT, PROT, ALBUMIN in the last 168 hours. No results for input(s): LIPASE, AMYLASE in the last 168 hours. No results for input(s): AMMONIA in the last 168 hours. Coagulation Profile: No results for input(s): INR, PROTIME in the last 168 hours. Cardiac Enzymes: No results for input(s): CKTOTAL, CKMB, CKMBINDEX, TROPONINI in the last 168 hours. BNP (last 3 results) No results for input(s): PROBNP in the last 8760 hours. HbA1C: No results for input(s): HGBA1C in the last 72 hours. CBG: Recent Labs  Lab 04/18/19 1213 04/18/19 1635 04/18/19 2113 04/19/19 0846 04/19/19 1153  GLUCAP 121* 115* 117* 180* 178*   Lipid  Profile: No results for input(s): CHOL, HDL, LDLCALC, TRIG, CHOLHDL, LDLDIRECT in the last 72 hours. Thyroid Function Tests: No results for input(s): TSH, T4TOTAL, FREET4, T3FREE, THYROIDAB in the last 72 hours. Anemia Panel: No results for input(s): VITAMINB12, FOLATE, FERRITIN, TIBC, IRON, RETICCTPCT in the last 72 hours. Sepsis Labs: No results for input(s): PROCALCITON, LATICACIDVEN in the last 168 hours.  Recent Results (from the past 240 hour(s))  Urine culture     Status: Abnormal   Collection Time: 04/10/19 11:28 AM   Specimen: Urine, Random  Result Value Ref Range Status   Specimen Description   Final    URINE, RANDOM Performed at [facility], [address] [place], [place] [address]    Special Requests   Final    NONE Performed at [facility], [address] [place], [place] [address]    Culture >=100,000 COLONIES/mL ESCHERICHIA COLI (A)  Final   Report Status 04/12/2019 FINAL  Final   Organism ID, Bacteria ESCHERICHIA COLI (A)  Final      Susceptibility  Escherichia coli - MIC*    AMPICILLIN 16 INTERMEDIATE Intermediate     CEFAZOLIN <=4 SENSITIVE Sensitive     CEFTRIAXONE <=1 SENSITIVE Sensitive     CIPROFLOXACIN >=4 RESISTANT Resistant     GENTAMICIN <=1 SENSITIVE Sensitive     IMIPENEM <=0.25 SENSITIVE Sensitive     NITROFURANTOIN 32 SENSITIVE Sensitive     TRIMETH/SULFA <=20 SENSITIVE Sensitive     AMPICILLIN/SULBACTAM 4 SENSITIVE Sensitive     PIP/TAZO 8 SENSITIVE Sensitive     Extended ESBL NEGATIVE Sensitive     * >=100,000 COLONIES/mL ESCHERICHIA COLI  Surgical pcr screen     Status: Abnormal   Collection Time: 04/12/19  7:10 AM   Specimen: Nasal Mucosa; Nasal Swab  Result Value Ref Range Status   MRSA, PCR NEGATIVE NEGATIVE Final   Staphylococcus aureus POSITIVE (A) NEGATIVE Final    Comment: (NOTE) The Xpert SA Assay (FDA approved for NASAL specimens in patients 74 years of age and older), is one component of a  comprehensive surveillance program. It is not intended to diagnose infection nor to guide or monitor treatment. Performed at [facility], [address], [place], [place] [address]   Aerobic/Anaerobic Culture (surgical/deep wound)     Status: None   Collection Time: 04/12/19  8:46 AM   Specimen: PATH Cytology Misc. fluid; Body Fluid  Result Value Ref Range Status   Specimen Description   Final    WOUND LEFT FIBULA Performed at [facility], [address] [place], [place] [address]    Special Requests   Final    NONE Performed at [facility], [address] [place], [place] [address]    Gram Stain NO WBC SEEN NO ORGANISMS SEEN   Final   Culture   Final    FEW STAPHYLOCOCCUS AUREUS NO ANAEROBES ISOLATED Performed at [facility], [place] [address] [place], [place] [address]    Report Status 04/17/2019 FINAL  Final   Organism ID, Bacteria STAPHYLOCOCCUS AUREUS  Final      Susceptibility   Staphylococcus aureus - MIC*    CIPROFLOXACIN >=8 RESISTANT Resistant     ERYTHROMYCIN <=0.25 SENSITIVE Sensitive     GENTAMICIN <=0.5 SENSITIVE Sensitive     OXACILLIN <=0.25 SENSITIVE Sensitive     TETRACYCLINE <=1 SENSITIVE Sensitive     VANCOMYCIN <=0.5 SENSITIVE Sensitive     TRIMETH/SULFA <=10 SENSITIVE Sensitive     CLINDAMYCIN <=0.25 SENSITIVE Sensitive     RIFAMPIN <=0.5 SENSITIVE Sensitive     Inducible Clindamycin NEGATIVE Sensitive     * FEW STAPHYLOCOCCUS AUREUS  Aerobic/Anaerobic Culture (surgical/deep wound)     Status: None   Collection Time: 04/12/19  9:01 AM   Specimen: PATH Cytology Misc. fluid; Body Fluid  Result Value Ref Range Status   Specimen Description   Final    WOUND Performed at [facility], [address], [place], [place] [address]    Special Requests   Final    LEFT ANKLE JOINT FLUID ASPIRATION Performed at [facility], [address] [place], [place] [address]    Gram Stain NO WBC SEEN NO  ORGANISMS SEEN   Final   Culture   Final    RARE STAPHYLOCOCCUS AUREUS NO ANAEROBES ISOLATED Performed at [facility], [place] [address], [place], [place] [address]    Report Status 04/17/2019 FINAL  Final   Organism ID, Bacteria STAPHYLOCOCCUS AUREUS  Final      Susceptibility   Staphylococcus  aureus - MIC*    CIPROFLOXACIN >=8 RESISTANT Resistant     ERYTHROMYCIN <=0.25 SENSITIVE Sensitive     GENTAMICIN <=0.5 SENSITIVE Sensitive     OXACILLIN <=0.25 SENSITIVE Sensitive     TETRACYCLINE <=1 SENSITIVE Sensitive     VANCOMYCIN <=0.5 SENSITIVE Sensitive     TRIMETH/SULFA <=10 SENSITIVE Sensitive     CLINDAMYCIN <=0.25 SENSITIVE Sensitive     RIFAMPIN <=0.5 SENSITIVE Sensitive     Inducible Clindamycin NEGATIVE Sensitive     * RARE STAPHYLOCOCCUS AUREUS  Aerobic/Anaerobic Culture (surgical/deep wound)     Status: None   Collection Time: 04/12/19  9:01 AM   Specimen: PATH Soft tissue  Result Value Ref Range Status   Specimen Description   Final    TISSUE Performed at [facility], [place] [address], [place], [place] [address]    Special Requests   Final    WOUND TISSUE LEFT ANKLE Performed at [facility], [address] [place], [place] [address]    Gram Stain   Final    MODERATE WBC PRESENT, PREDOMINANTLY PMN RARE GRAM POSITIVE COCCI    Culture   Final    FEW STAPHYLOCOCCUS AUREUS NO ANAEROBES ISOLATED Performed at [facility], [place] [address], [place], [place] [address]    Report Status 04/17/2019 FINAL  Final   Organism ID, Bacteria STAPHYLOCOCCUS AUREUS  Final      Susceptibility   Staphylococcus aureus - MIC*    CIPROFLOXACIN >=8 RESISTANT Resistant     ERYTHROMYCIN <=0.25 SENSITIVE Sensitive     GENTAMICIN <=0.5 SENSITIVE Sensitive     OXACILLIN <=0.25 SENSITIVE Sensitive     TETRACYCLINE <=1 SENSITIVE Sensitive     VANCOMYCIN <=0.5 SENSITIVE Sensitive     TRIMETH/SULFA <=10 SENSITIVE Sensitive     CLINDAMYCIN <=0.25 SENSITIVE  Sensitive     RIFAMPIN <=0.5 SENSITIVE Sensitive     Inducible Clindamycin NEGATIVE Sensitive     * FEW STAPHYLOCOCCUS AUREUS  Aerobic/Anaerobic Culture (surgical/deep wound)     Status: None   Collection Time: 04/12/19  9:15 AM   Specimen: PATH Cytology Misc. fluid; Body Fluid  Result Value Ref Range Status   Specimen Description   Final    WOUND Performed at [facility], [address] [place], [place] [address]    Special Requests   Final    WOUND CULTURE LEFT ANKLE Performed at [facility], [address] [place], [place] [address]    Gram Stain NO WBC SEEN NO ORGANISMS SEEN   Final   Culture   Final    FEW STAPHYLOCOCCUS AUREUS NO ANAEROBES ISOLATED Performed at [facility], [place] [address] [place], [place] [address]    Report Status 04/17/2019 FINAL  Final   Organism ID, Bacteria STAPHYLOCOCCUS AUREUS  Final      Susceptibility   Staphylococcus aureus - MIC*    CIPROFLOXACIN >=8 RESISTANT Resistant     ERYTHROMYCIN <=0.25 SENSITIVE Sensitive     GENTAMICIN <=0.5 SENSITIVE Sensitive     OXACILLIN <=0.25 SENSITIVE Sensitive     TETRACYCLINE <=1 SENSITIVE Sensitive     VANCOMYCIN 1 SENSITIVE Sensitive     TRIMETH/SULFA <=10 SENSITIVE Sensitive     CLINDAMYCIN <=0.25 SENSITIVE Sensitive     RIFAMPIN <=0.5 SENSITIVE Sensitive     Inducible Clindamycin NEGATIVE Sensitive     * FEW STAPHYLOCOCCUS AUREUS  SARS CORONAVIRUS 2 (TAT 6-24 HRS) Nasopharyngeal Nasopharyngeal Swab     Status: None  Collection Time: 04/16/19  9:56 AM   Specimen: Nasopharyngeal Swab  Result Value Ref Range Status   SARS Coronavirus 2 NEGATIVE NEGATIVE Final    Comment: (NOTE) SARS-CoV-2 target nucleic acids are NOT DETECTED. The SARS-CoV-2 RNA is generally detectable in upper and lower respiratory specimens during the acute phase of infection. Negative results do not preclude SARS-CoV-2 infection, do not rule out co-infections with other pathogens, and should not  be used as the sole basis for treatment or other patient management decisions. Negative results must be combined with clinical observations, patient history, and epidemiological information. The expected result is Negative. Fact Sheet for Patients: SugarRoll.be Fact Sheet for Healthcare Providers: https://www.woods-mathews.com/ This test is not yet approved or cleared by the [place] FDA and  has been authorized for detection and/or diagnosis of SARS-CoV-2 by FDA under an Emergency Use Authorization (EUA). This EUA will remain  in effect (meaning this test can be used) for the duration of the COVID-19 declaration under Section 56 4(b)(1) of the Act, 21 U.S.C. section 360bbb-3(b)(1), unless the authorization is terminated or revoked sooner. Performed at [facility], [place] [address], [place], [place] [address]   Aerobic/Anaerobic Culture (surgical/deep wound)     Status: None (Preliminary result)   Collection Time: 04/16/19  3:24 PM   Specimen: PATH Other; Wound  Result Value Ref Range Status   Specimen Description   Final    WOUND left ankle Performed at [place] [address], [place], [place] [address]    Special Requests   Final    NONE Performed at [facility], [place], [place] [address]    Gram Stain   Final    FEW WBC PRESENT, PREDOMINANTLY PMN RARE GRAM POSITIVE COCCI Performed at [facility], [place] [address], [place], [place] [address]    Culture   Final    RARE STAPHYLOCOCCUS AUREUS SUSCEPTIBILITIES TO FOLLOW NO ANAEROBES ISOLATED; CULTURE IN PROGRESS FOR 5 DAYS    Report Status PENDING  Incomplete         Radiology Studies: No results found.      Scheduled Meds: . amLODipine  10 mg Oral Daily  . aspirin EC  81 mg Oral Daily  . benazepril  20 mg Oral BID  . Chlorhexidine Gluconate Cloth  6 each Topical Daily  . clopidogrel  75 mg Oral Daily  .  DULoxetine  30 mg Oral QPM  . DULoxetine  60 mg Oral Daily  . ezetimibe  10 mg Oral Daily  . feeding supplement (PRO-STAT SUGAR FREE 64)  30 mL Oral Daily  . fenofibrate  54 mg Oral q1800  . furosemide  20 mg Oral Daily  . hydrochlorothiazide  12.5 mg Oral Daily  . insulin aspart  0-20 Units Subcutaneous TID WC  . insulin detemir  48 Units Subcutaneous QHS  . linaclotide  290 mcg Oral QAC breakfast  . multivitamin with minerals  1 tablet Oral Daily  . mupirocin ointment  1 application Nasal BID  . omega-3 acid ethyl esters  1 g Oral BID  . oxcarbazepine  600 mg Oral BID  . oxyCODONE  20 mg Oral Q12H  . pantoprazole  40 mg Oral Daily  . potassium chloride  40 mEq Oral Q4H  . pregabalin  200 mg Oral Daily  . pregabalin  400 mg Oral Daily  . sodium chloride flush  10-40 mL Intracatheter Q12H  . tamsulosin  0.4 mg Oral q1800  .  vitamin C  500 mg Oral BID   Continuous Infusions: .  ceFAZolin (ANCEF) IV 2 g (04/20/19 0553)     LOS: 10 days    Time spent: 15-20 minutes    Ezekiel Slocumb, DO [facility] Pager: [phone]  If 7PM-7AM, please contact night-coverage [email or web address] Password [email or web address] 04/20/2019, 8:17 AM

## 2019-04-20 NOTE — Progress Notes (Signed)
Subjective: 4 Days Post-Op Procedure(s) (LRB): APPLICATION OF WOUND VAC (Left) APPLICATION OF A-CELL OF EXTREMITY (Left)   Patient is comfortable with no voiced problems today.  Cast remains in place.  Patient reports pain as mild.  Objective:   VITALS:   Vitals:   04/19/19 2305 04/20/19 0920  BP: 120/69 126/72  Pulse: 60 64  Resp: 18 20  Temp: 97.6 F (36.4 C) 97.8 F (36.6 C)  SpO2: 100% 97%    Neurologically intact Sensation intact distally Incision: scant drainage  LABS Recent Labs    04/18/19 0452  HGB 8.5*  HCT 27.2*  WBC 4.2  PLT 206    Recent Labs    04/18/19 0452 04/19/19 0500 04/20/19 0548  NA 142 140 140  K 3.3* 3.3* 3.2*  BUN 12 15 20   CREATININE 0.81 0.81 0.81  GLUCOSE 151* 156* 143*    No results for input(s): LABPT, INR in the last 72 hours.   Assessment/Plan: 4 Days Post-Op Procedure(s) (LRB): APPLICATION OF WOUND VAC (Left) APPLICATION OF A-CELL OF EXTREMITY (Left)   Up with therapy

## 2019-04-21 LAB — BASIC METABOLIC PANEL
Anion gap: 12 (ref 5–15)
BUN: 25 mg/dL — ABNORMAL HIGH (ref 8–23)
CO2: 28 mmol/L (ref 22–32)
Calcium: 9 mg/dL (ref 8.9–10.3)
Chloride: 104 mmol/L (ref 98–111)
Creatinine, Ser: 0.84 mg/dL (ref 0.61–1.24)
GFR calc Af Amer: >60 mL/min (ref >60)
GFR calc non Af Amer: >60 mL/min (ref >60)
Glucose, Bld: 183 mg/dL — ABNORMAL HIGH (ref 70–99)
Potassium: 3.7 mmol/L (ref 3.5–5.1)
Sodium: 144 mmol/L (ref 135–145)

## 2019-04-21 LAB — AEROBIC/ANAEROBIC CULTURE W GRAM STAIN (SURGICAL/DEEP WOUND)

## 2019-04-21 LAB — GLUCOSE, CAPILLARY
Glucose-Capillary: 163 mg/dL — ABNORMAL HIGH (ref 70–99)
Glucose-Capillary: 235 mg/dL — ABNORMAL HIGH (ref 70–99)
Glucose-Capillary: 191 mg/dL — ABNORMAL HIGH (ref 70–99)
Glucose-Capillary: 171 mg/dL — ABNORMAL HIGH (ref 70–99)

## 2019-04-21 LAB — MAGNESIUM: Magnesium: 1.7 mg/dL (ref 1.7–2.4)

## 2019-04-21 NOTE — Progress Notes (Signed)
Subjective: 5 Days Post-Op Procedure(s) (LRB): APPLICATION OF WOUND VAC (Left) APPLICATION OF A-CELL OF EXTREMITY (Left)   Patient is unchanged.  There remains somnolent.  He is reluctant to get out of bed.  Patient reports pain as mild.  Objective:   VITALS:   Vitals:   04/20/19 2345 04/21/19 0757  BP: 131/62 130/60  Pulse: 62 66  Resp: 18 20  Temp: (!) 97.5 F (36.4 C) 97.9 F (36.6 C)  SpO2: 100% 100%    Neurologically intact ABD soft Neurovascular intact Sensation intact distally  LABS No results for input(s): HGB, HCT, WBC, PLT in the last 72 hours.  Recent Labs    04/19/19 0500 04/20/19 0548 04/21/19 0519  NA 140 140 144  K 3.3* 3.2* 3.7  BUN 15 20 25*  CREATININE 0.81 0.81 0.84  GLUCOSE 156* 143* 183*    No results for input(s): LABPT, INR in the last 72 hours.   Assessment/Plan: 5 Days Post-Op Procedure(s) (LRB): APPLICATION OF WOUND VAC (Left) APPLICATION OF A-CELL OF EXTREMITY (Left)   Up with therapy--will encourage PT to ambulate him.

## 2019-04-21 NOTE — Progress Notes (Signed)
PROGRESS NOTE    Victor Wise  WPV:[id number] DOB: [birth year] DOA: 04/10/2019  PCP: Lavera Guise, MD    LOS - 11   Brief Narrative:  68 y.o.male,admitted to orthopedic service forleft ankle wound dehiscence which began 7 weeks postop when the patient was converted from a cast to a walking boot.Hospitalists consulted for IV antibiotics and other medical management. Infectious disease following.Will continue on Ancef until Jan 10th, and will have continued need for wound vac as well. SW involved for LTAC placement as patient has medical needs for next several weeks that cannot be addressed at home safely.  PT also recommending SNF for rehab on discharge due to significant functional deficits at this time.  Subjective 12/6: Patient awake sitting up eating breakfast.  States his leg pain is 8 or 90 out of 10, due for pain medication.  No acute events reported.  No other acute complaints.   Assessment & Plan:   Principal Problem:   Infected hardware in left lower extremity (HCC) Active Problems:   Mixed hyperlipidemia   Essential hypertension   OSA on CPAP   Benign essential hypertension   Coronary artery disease due to lipid rich plaque   Type 2 diabetes mellitus with hyperlipidemia (HCC)   Benign prostatic hyperplasia without lower urinary tract symptoms   Obesity, Class III, BMI 40-49.9 (morbid obesity) ([place])   Bimalleolar fracture of left ankle   Wound dehiscence, surgical   Pyogenic inflammation of bone (HCC)   Preop examination   Pressure injury of skin   Anemia   Hypokalemia   Diabetic polyneuropathy associated with type 2 diabetes mellitus (HCC)   Moderate episode of recurrent major depressive disorder (HCC)   Left ankle osteomyelitis with hardware infection -Orthopedics is primary -Infectious disease following -PICC line placed 12/1 -Continue Ancef 2 g IV every 8 hours for 6-week(course to end January 10) -Weekly labs to include CBC with differential, CMP,  ESR while on IV antibiotics -Wound care -ID follow-up in clinic in 4 weeks -Nonweightbearing on the left lower extremity -Maintain wound VAC --SW awaiting insurance approval for LTAC / SNF.  Type 2 diabetes with hyperglycemia Hyperlipidemia Peripheral neuropathy Prior toe amputations -Levemir 48 units nightly -Sliding scale insulin -Continue Zetia, fenofibrate  Essential hypertension-chronic, stable -Continue amlodipine, benazepril, HCTZ, Lasix  Chronic systolic CHF, EF 74% -No signs of acute CHF -Continue home ACE and Lasix  UTI-urine culture grew E. coli, sensitive to Ancef  Hypokalemia-resolved -Monitor and replete as needed  Postop anemia-stable with hemoglobin 8.0 and no signs of bleeding -Monitor CBC -Transfuse if hemoglobin less than 7  Obstructive sleep apnea-continue CPAP  Coronary artery disease-continue aspirin and Plavix  DVT prophylaxis:SCDs Code Status: Full Code Family Communication:None at bedside Disposition Plan:Likely home with home health, expect medically ready tomorrow. Pending clearance by Ortho   Consultants:  Ortho is primary  Infectious disease  Procedures:  12/1 -wound VAC replacement  Antimicrobials:  Ancef-end date January10,2021   Objective: Vitals:   04/20/19 0920 04/20/19 2111 04/20/19 2345 04/21/19 0757  BP: 126/72 134/72 131/62 130/60  Pulse: 64  62 66  Resp: 20  18 20   Temp: 97.8 F (36.6 C)  (!) 97.5 F (36.4 C) 97.9 F (36.6 C)  TempSrc: Oral  Oral Oral  SpO2: 97%  100% 100%  Weight:      Height:        Intake/Output Summary (Last 24 hours) at 04/21/2019 0819 Last data filed at 04/21/2019 0449 Gross per 24 hour  Intake -  Output 300 ml  Net -300 ml   Filed Weights   04/10/19 0840  Weight: 122.5 kg    Examination:  General exam: awake, alert, no acute distress Respiratory system: clear to auscultation bilaterally, no wheezes, rales or rhonchi, normal respiratory  effort. Cardiovascular system: normal S1/S2, RRR, no JVD, murmurs, rubs, gallops, no pedal edema.   Gastrointestinal system: soft, non-tender, non-distended abdomen Central nervous system: alert and oriented x4. no gross focal neurologic deficits, normal speech Extremities: left lower extremity in cast, no edema, normal tone Psychiatric: normal mood, flat affect, judgment and insight normal   Data Reviewed: I have personally reviewed following labs and imaging studies  CBC: Recent Labs  Lab 04/15/19 0455 04/16/19 0418 04/18/19 0452  WBC  --   --  4.2  HGB 7.9* 8.0* 8.5*  HCT  --   --  27.2*  MCV  --   --  77.9*  PLT  --   --  881   Basic Metabolic Panel: Recent Labs  Lab 04/16/19 0418 04/18/19 0452 04/19/19 0500 04/20/19 0548 04/21/19 0519  NA 140 142 140 140 144  K 3.4* 3.3* 3.3* 3.2* 3.7  CL 105 106 103 104 104  CO2 28 27 27 28 28   GLUCOSE 212* 151* 156* 143* 183*  BUN 18 12 15 20  25*  CREATININE 0.84 0.81 0.81 0.81 0.84  CALCIUM 8.7* 8.9 8.8* 8.8* 9.0  MG  --   --   --  1.7 1.7   GFR: Estimated Creatinine Clearance: 108.6 mL/min (by C-G formula based on SCr of 0.84 mg/dL). Liver Function Tests: No results for input(s): AST, ALT, ALKPHOS, BILITOT, PROT, ALBUMIN in the last 168 hours. No results for input(s): LIPASE, AMYLASE in the last 168 hours. No results for input(s): AMMONIA in the last 168 hours. Coagulation Profile: No results for input(s): INR, PROTIME in the last 168 hours. Cardiac Enzymes: No results for input(s): CKTOTAL, CKMB, CKMBINDEX, TROPONINI in the last 168 hours. BNP (last 3 results) No results for input(s): PROBNP in the last 8760 hours. HbA1C: No results for input(s): HGBA1C in the last 72 hours. CBG: Recent Labs  Lab 04/19/19 0846 04/19/19 1153 04/20/19 0900 04/20/19 1151 04/20/19 2041  GLUCAP 180* 178* 141* 195* 190*   Lipid Profile: No results for input(s): CHOL, HDL, LDLCALC, TRIG, CHOLHDL, LDLDIRECT in the last 72 hours.  Thyroid Function Tests: No results for input(s): TSH, T4TOTAL, FREET4, T3FREE, THYROIDAB in the last 72 hours. Anemia Panel: No results for input(s): VITAMINB12, FOLATE, FERRITIN, TIBC, IRON, RETICCTPCT in the last 72 hours. Sepsis Labs: No results for input(s): PROCALCITON, LATICACIDVEN in the last 168 hours.  Recent Results (from the past 240 hour(s))  Surgical pcr screen     Status: Abnormal   Collection Time: 04/12/19  7:10 AM   Specimen: Nasal Mucosa; Nasal Swab  Result Value Ref Range Status   MRSA, PCR NEGATIVE NEGATIVE Final   Staphylococcus aureus POSITIVE (A) NEGATIVE Final    Comment: (NOTE) The Xpert SA Assay (FDA approved for NASAL specimens in patients 39 years of age and older), is one component of a comprehensive surveillance program. It is not intended to diagnose infection nor to guide or monitor treatment. Performed at [facility], [address], [place], [place] [address]   Aerobic/Anaerobic Culture (surgical/deep wound)     Status: None   Collection Time: 04/12/19  8:46 AM   Specimen: PATH Cytology Misc. fluid; Body Fluid  Result Value Ref Range Status   Specimen Description  Final    WOUND LEFT FIBULA Performed at [facility], [address] [place], [place] [address]    Special Requests   Final    NONE Performed at [facility], [place], [place] [address]    Gram Stain NO WBC SEEN NO ORGANISMS SEEN   Final   Culture   Final    FEW STAPHYLOCOCCUS AUREUS NO ANAEROBES ISOLATED Performed at [facility], [place] [address] [place], [place] [address]    Report Status 04/17/2019 FINAL  Final   Organism ID, Bacteria STAPHYLOCOCCUS AUREUS  Final      Susceptibility   Staphylococcus aureus - MIC*    CIPROFLOXACIN >=8 RESISTANT Resistant     ERYTHROMYCIN <=0.25 SENSITIVE Sensitive     GENTAMICIN <=0.5 SENSITIVE Sensitive     OXACILLIN <=0.25 SENSITIVE Sensitive     TETRACYCLINE <=1 SENSITIVE  Sensitive     VANCOMYCIN <=0.5 SENSITIVE Sensitive     TRIMETH/SULFA <=10 SENSITIVE Sensitive     CLINDAMYCIN <=0.25 SENSITIVE Sensitive     RIFAMPIN <=0.5 SENSITIVE Sensitive     Inducible Clindamycin NEGATIVE Sensitive     * FEW STAPHYLOCOCCUS AUREUS  Aerobic/Anaerobic Culture (surgical/deep wound)     Status: None   Collection Time: 04/12/19  9:01 AM   Specimen: PATH Cytology Misc. fluid; Body Fluid  Result Value Ref Range Status   Specimen Description   Final    WOUND Performed at [facility], [address] [place], [place] [address]    Special Requests   Final    LEFT ANKLE JOINT FLUID ASPIRATION Performed at [facility], [address] [place], [place] [address]    Gram Stain NO WBC SEEN NO ORGANISMS SEEN   Final   Culture   Final    RARE STAPHYLOCOCCUS AUREUS NO ANAEROBES ISOLATED Performed at [facility], [place] [address], [place], [place] [address]    Report Status 04/17/2019 FINAL  Final   Organism ID, Bacteria STAPHYLOCOCCUS AUREUS  Final      Susceptibility   Staphylococcus aureus - MIC*    CIPROFLOXACIN >=8 RESISTANT Resistant     ERYTHROMYCIN <=0.25 SENSITIVE Sensitive     GENTAMICIN <=0.5 SENSITIVE Sensitive     OXACILLIN <=0.25 SENSITIVE Sensitive     TETRACYCLINE <=1 SENSITIVE Sensitive     VANCOMYCIN <=0.5 SENSITIVE Sensitive     TRIMETH/SULFA <=10 SENSITIVE Sensitive     CLINDAMYCIN <=0.25 SENSITIVE Sensitive     RIFAMPIN <=0.5 SENSITIVE Sensitive     Inducible Clindamycin NEGATIVE Sensitive     * RARE STAPHYLOCOCCUS AUREUS  Aerobic/Anaerobic Culture (surgical/deep wound)     Status: None   Collection Time: 04/12/19  9:01 AM   Specimen: PATH Soft tissue  Result Value Ref Range Status   Specimen Description   Final    TISSUE Performed at [facility], [place] [address], [place], [place] [address]    Special Requests   Final    WOUND TISSUE LEFT ANKLE Performed at [facility], [address]  [place], [place] [address]    Gram Stain   Final    MODERATE WBC PRESENT, PREDOMINANTLY PMN RARE GRAM POSITIVE COCCI    Culture   Final    FEW STAPHYLOCOCCUS AUREUS NO ANAEROBES ISOLATED Performed at [facility], [place] [address], [place], [place] [address]    Report Status 04/17/2019 FINAL  Final   Organism ID, Bacteria STAPHYLOCOCCUS AUREUS  Final  Susceptibility   Staphylococcus aureus - MIC*    CIPROFLOXACIN >=8 RESISTANT Resistant     ERYTHROMYCIN <=0.25 SENSITIVE Sensitive     GENTAMICIN <=0.5 SENSITIVE Sensitive     OXACILLIN <=0.25 SENSITIVE Sensitive     TETRACYCLINE <=1 SENSITIVE Sensitive     VANCOMYCIN <=0.5 SENSITIVE Sensitive     TRIMETH/SULFA <=10 SENSITIVE Sensitive     CLINDAMYCIN <=0.25 SENSITIVE Sensitive     RIFAMPIN <=0.5 SENSITIVE Sensitive     Inducible Clindamycin NEGATIVE Sensitive     * FEW STAPHYLOCOCCUS AUREUS  Aerobic/Anaerobic Culture (surgical/deep wound)     Status: None   Collection Time: 04/12/19  9:15 AM   Specimen: PATH Cytology Misc. fluid; Body Fluid  Result Value Ref Range Status   Specimen Description   Final    WOUND Performed at [facility], [address] [place], [place] [address]    Special Requests   Final    WOUND CULTURE LEFT ANKLE Performed at [facility], [address] [place], [place] [address]    Gram Stain NO WBC SEEN NO ORGANISMS SEEN   Final   Culture   Final    FEW STAPHYLOCOCCUS AUREUS NO ANAEROBES ISOLATED Performed at [facility], [place] [address] [place], [place] [address]    Report Status 04/17/2019 FINAL  Final   Organism ID, Bacteria STAPHYLOCOCCUS AUREUS  Final      Susceptibility   Staphylococcus aureus - MIC*    CIPROFLOXACIN >=8 RESISTANT Resistant     ERYTHROMYCIN <=0.25 SENSITIVE Sensitive     GENTAMICIN <=0.5 SENSITIVE Sensitive     OXACILLIN <=0.25 SENSITIVE Sensitive     TETRACYCLINE <=1 SENSITIVE Sensitive     VANCOMYCIN 1 SENSITIVE Sensitive      TRIMETH/SULFA <=10 SENSITIVE Sensitive     CLINDAMYCIN <=0.25 SENSITIVE Sensitive     RIFAMPIN <=0.5 SENSITIVE Sensitive     Inducible Clindamycin NEGATIVE Sensitive     * FEW STAPHYLOCOCCUS AUREUS  SARS CORONAVIRUS 2 (TAT 6-24 HRS) Nasopharyngeal Nasopharyngeal Swab     Status: None   Collection Time: 04/16/19  9:56 AM   Specimen: Nasopharyngeal Swab  Result Value Ref Range Status   SARS Coronavirus 2 NEGATIVE NEGATIVE Final    Comment: (NOTE) SARS-CoV-2 target nucleic acids are NOT DETECTED. The SARS-CoV-2 RNA is generally detectable in upper and lower respiratory specimens during the acute phase of infection. Negative results do not preclude SARS-CoV-2 infection, do not rule out co-infections with other pathogens, and should not be used as the sole basis for treatment or other patient management decisions. Negative results must be combined with clinical observations, patient history, and epidemiological information. The expected result is Negative. Fact Sheet for Patients: SugarRoll.be Fact Sheet for Healthcare Providers: https://www.woods-mathews.com/ This test is not yet approved or cleared by the [place] FDA and  has been authorized for detection and/or diagnosis of SARS-CoV-2 by FDA under an Emergency Use Authorization (EUA). This EUA will remain  in effect (meaning this test can be used) for the duration of the COVID-19 declaration under Section 56 4(b)(1) of the Act, 21 U.S.C. section 360bbb-3(b)(1), unless the authorization is terminated or revoked sooner. Performed at [facility], [place] [address] [place], [place] [address]   Aerobic/Anaerobic Culture (surgical/deep wound)     Status: None (Preliminary result)   Collection Time: 04/16/19  3:24 PM   Specimen: PATH Other; Wound  Result Value Ref Range Status   Specimen Description   Final    WOUND  left ankle Performed at [facility], [place] [address],  [place], [place] [address]    Special Requests   Final    NONE Performed at [facility], [place], [place] [address]    Gram Stain   Final    FEW WBC PRESENT, PREDOMINANTLY PMN RARE GRAM POSITIVE COCCI Performed at [facility], [place] [address] [place], [place] [address]    Culture   Final    RARE STAPHYLOCOCCUS AUREUS NO ANAEROBES ISOLATED; CULTURE IN PROGRESS FOR 5 DAYS    Report Status PENDING  Incomplete   Organism ID, Bacteria STAPHYLOCOCCUS AUREUS  Final      Susceptibility   Staphylococcus aureus - MIC*    CIPROFLOXACIN >=8 RESISTANT Resistant     ERYTHROMYCIN <=0.25 SENSITIVE Sensitive     GENTAMICIN <=0.5 SENSITIVE Sensitive     OXACILLIN <=0.25 SENSITIVE Sensitive     TETRACYCLINE <=1 SENSITIVE Sensitive     VANCOMYCIN 1 SENSITIVE Sensitive     TRIMETH/SULFA <=10 SENSITIVE Sensitive     CLINDAMYCIN <=0.25 SENSITIVE Sensitive     RIFAMPIN <=0.5 SENSITIVE Sensitive     Inducible Clindamycin NEGATIVE Sensitive     * RARE STAPHYLOCOCCUS AUREUS         Radiology Studies: No results found.      Scheduled Meds: . amLODipine  10 mg Oral Daily  . aspirin EC  81 mg Oral Daily  . benazepril  20 mg Oral BID  . Chlorhexidine Gluconate Cloth  6 each Topical Daily  . clopidogrel  75 mg Oral Daily  . DULoxetine  30 mg Oral QPM  . DULoxetine  60 mg Oral Daily  . ezetimibe  10 mg Oral Daily  . feeding supplement (PRO-STAT SUGAR FREE 64)  30 mL Oral Daily  . fenofibrate  54 mg Oral q1800  . furosemide  20 mg Oral Daily  . hydrochlorothiazide  12.5 mg Oral Daily  . insulin aspart  0-20 Units Subcutaneous TID WC  . insulin detemir  48 Units Subcutaneous QHS  . linaclotide  290 mcg Oral QAC breakfast  . multivitamin with minerals  1 tablet Oral Daily  . omega-3 acid ethyl esters  1 g Oral BID  . oxcarbazepine  600 mg Oral BID  . oxyCODONE  20 mg Oral Q12H  . pantoprazole  40 mg Oral Daily  . pregabalin  200 mg Oral Daily  . pregabalin   400 mg Oral Daily  . sodium chloride flush  10-40 mL Intracatheter Q12H  . tamsulosin  0.4 mg Oral q1800  . vitamin C  500 mg Oral BID   Continuous Infusions: .  ceFAZolin (ANCEF) IV 2 g (04/21/19 0526)     LOS: 11 days    Time spent: 25 minutes    Ezekiel Slocumb, DO [facility] Pager: [phone]  If 7PM-7AM, please contact night-coverage [email or web address] Password [email or web address] 04/21/2019, 8:19 AM

## 2019-04-22 LAB — GLUCOSE, CAPILLARY
Glucose-Capillary: 151 mg/dL — ABNORMAL HIGH (ref 70–99)
Glucose-Capillary: 133 mg/dL — ABNORMAL HIGH (ref 70–99)
Glucose-Capillary: 186 mg/dL — ABNORMAL HIGH (ref 70–99)
Glucose-Capillary: 131 mg/dL — ABNORMAL HIGH (ref 70–99)
Glucose-Capillary: 168 mg/dL — ABNORMAL HIGH (ref 70–99)
Glucose-Capillary: 245 mg/dL — ABNORMAL HIGH (ref 70–99)
Glucose-Capillary: 209 mg/dL — ABNORMAL HIGH (ref 70–99)

## 2019-04-22 MED ORDER — ENOXAPARIN SODIUM 40 MG/0.4ML ~~LOC~~ SOLN
40.0000 mg | SUBCUTANEOUS | Status: DC
  Administered 2019-04-22: 23:00:00 40 mg via SUBCUTANEOUS
  Filled 2019-04-22: qty 0.4

## 2019-04-22 MED ORDER — OXYCODONE HCL ER 20 MG PO T12A: 20.0000 mg | EXTENDED_RELEASE_TABLET | Freq: Two times a day (BID) | ORAL | 0 refills | Status: DC

## 2019-04-22 MED ORDER — OXYCODONE HCL 5 MG PO TABS: 5.0000 mg | ORAL_TABLET | ORAL | 0 refills | Status: DC | PRN

## 2019-04-22 MED ORDER — CEFAZOLIN IV (FOR PTA / DISCHARGE USE ONLY): 2.0000 g | Freq: Three times a day (TID) | INTRAVENOUS | 0 refills | Status: DC

## 2019-04-22 MED ORDER — ENOXAPARIN SODIUM 40 MG/0.4ML ~~LOC~~ SOLN: 40.0000 mg | SUBCUTANEOUS | 1 refills | Status: DC

## 2019-04-22 MED ORDER — CEFAZOLIN SODIUM-DEXTROSE 2-4 GM/100ML-% IV SOLN: 2.0000 g | Freq: Three times a day (TID) | INTRAVENOUS | 2 refills | Status: DC

## 2019-04-22 NOTE — Progress Notes (Signed)
PROGRESS NOTE    Victor Wise  NLZ:[id number] DOB: [birth year] DOA: 04/10/2019  PCP: Lavera Guise, MD    LOS - 12   Brief Narrative:  68 y.o.male,admitted to orthopedic service forleft ankle wound dehiscence which began 7 weeks postop when the patient was converted from a cast to a walking boot.Hospitalists consulted for IV antibiotics and other medical management. Infectious disease following.Will continue on Ancef until Jan 10th, and will have continued need for wound vac as well. SW involved for LTAC placement as patient has medical needs for next several weeks that cannot be addressed at [facility].  PT also recommending SNF for rehab on discharge due to significant functional deficits as he is to be nonweightbearing on his left lower extremity for an extended length of time.  Subjective 12/7: Patient sleeping comfortably, awoke easily.  States pain moderate right now.  No other complaints.  No acute events reported.   Assessment & Plan:   Principal Problem:   Infected hardware in left lower extremity (HCC) Active Problems:   Mixed hyperlipidemia   Essential hypertension   OSA on CPAP   Benign essential hypertension   Coronary artery disease due to lipid rich plaque   Type 2 diabetes mellitus with hyperlipidemia (HCC)   Benign prostatic hyperplasia without lower urinary tract symptoms   Obesity, Class III, BMI 40-49.9 (morbid obesity) ([place])   Bimalleolar fracture of left ankle   Wound dehiscence, surgical   Pyogenic inflammation of bone (HCC)   Preop examination   Pressure injury of skin   Anemia   Hypokalemia   Diabetic polyneuropathy associated with type 2 diabetes mellitus (HCC)   Moderate episode of recurrent major depressive disorder (HCC)   Left ankle osteomyelitis with hardware infection -Orthopedics is primary -Infectious disease following -PICC line placed 12/1 -Continue Ancef 2 g IV every 8 hours for 6-week(course to end January 10) -Weekly labs  to include CBC with differential, CMP, ESR while on IV antibiotics -Wound care -ID follow-up in clinic in 4 weeks -Nonweightbearing on the left lower extremity -Maintain wound VAC --SW awaiting insurance approval for LTAC / SNF.  Type 2 diabetes with hyperglycemia Hyperlipidemia Peripheral neuropathy Prior toe amputations -Levemir 48 units nightly -Sliding scale insulin -Continue Zetia, fenofibrate  Essential hypertension-chronic, stable -Continue amlodipine, benazepril, HCTZ, Lasix  Chronic systolic CHF, EF 90% -No signs of acute CHF -Continue home ACE and Lasix  UTI-urine culture grew E. coli, sensitive to Ancef  Hypokalemia-resolved -Monitor and replete as needed  Postop anemia-stable with hemoglobin 8.0 and no signs of bleeding -Monitor CBC -Transfuse if hemoglobin less than 7  Obstructive sleep apnea-continue CPAP  Coronary artery disease-continue aspirin and Plavix  DVT prophylaxis:SCDs Code Status: Full Code Family Communication:None at bedside Disposition Plan:Likely home with home health, expect medically ready tomorrow. Pending clearance by Ortho   Consultants:  Ortho is primary  Infectious disease  Procedures:  12/1 -wound VAC replacement  Antimicrobials:  Ancef-end date January10,2021  Objective: Vitals:   04/21/19 1709 04/21/19 2100 04/21/19 2316 04/22/19 0816  BP: 117/70 131/64 (!) 143/63 113/66  Pulse: 60  60 61  Resp:   18 18  Temp: 98.1 F (36.7 C)  98.1 F (36.7 C) 97.8 F (36.6 C)  TempSrc: Oral  Oral Oral  SpO2: 98%  96% 96%  Weight:      Height:        Intake/Output Summary (Last 24 hours) at 04/22/2019 1625 Last data filed at 04/22/2019 1449 Gross per 24 hour  Intake -  Output 475 ml  Net -475 ml   Filed Weights   04/10/19 0840  Weight: 122.5 kg    Examination:  General exam: sleeping comfortably, no acute distress Respiratory system: clear to auscultation bilaterally, normal  respiratory effort. Cardiovascular system: normal S1/S2, RRR,no pedal edema.   Extremities: LLE in cast, no edema, normal tone    Data Reviewed: I have personally reviewed following labs and imaging studies  CBC: Recent Labs  Lab 04/16/19 0418 04/18/19 0452  WBC  --  4.2  HGB 8.0* 8.5*  HCT  --  27.2*  MCV  --  77.9*  PLT  --  453   Basic Metabolic Panel: Recent Labs  Lab 04/16/19 0418 04/18/19 0452 04/19/19 0500 04/20/19 0548 04/21/19 0519  NA 140 142 140 140 144  K 3.4* 3.3* 3.3* 3.2* 3.7  CL 105 106 103 104 104  CO2 28 27 27 28 28   GLUCOSE 212* 151* 156* 143* 183*  BUN 18 12 15 20  25*  CREATININE 0.84 0.81 0.81 0.81 0.84  CALCIUM 8.7* 8.9 8.8* 8.8* 9.0  MG  --   --   --  1.7 1.7   GFR: Estimated Creatinine Clearance: 108.6 mL/min (by C-G formula based on SCr of 0.84 mg/dL). Liver Function Tests: No results for input(s): AST, ALT, ALKPHOS, BILITOT, PROT, ALBUMIN in the last 168 hours. No results for input(s): LIPASE, AMYLASE in the last 168 hours. No results for input(s): AMMONIA in the last 168 hours. Coagulation Profile: No results for input(s): INR, PROTIME in the last 168 hours. Cardiac Enzymes: No results for input(s): CKTOTAL, CKMB, CKMBINDEX, TROPONINI in the last 168 hours. BNP (last 3 results) No results for input(s): PROBNP in the last 8760 hours. HbA1C: No results for input(s): HGBA1C in the last 72 hours. CBG: Recent Labs  Lab 04/21/19 1152 04/21/19 1705 04/21/19 2043 04/22/19 0815 04/22/19 1204  GLUCAP 235* 191* 171* 131* 168*   Lipid Profile: No results for input(s): CHOL, HDL, LDLCALC, TRIG, CHOLHDL, LDLDIRECT in the last 72 hours. Thyroid Function Tests: No results for input(s): TSH, T4TOTAL, FREET4, T3FREE, THYROIDAB in the last 72 hours. Anemia Panel: No results for input(s): VITAMINB12, FOLATE, FERRITIN, TIBC, IRON, RETICCTPCT in the last 72 hours. Sepsis Labs: No results for input(s): PROCALCITON, LATICACIDVEN in the last 168  hours.  Recent Results (from the past 240 hour(s))  SARS CORONAVIRUS 2 (TAT 6-24 HRS) Nasopharyngeal Nasopharyngeal Swab     Status: None   Collection Time: 04/16/19  9:56 AM   Specimen: Nasopharyngeal Swab  Result Value Ref Range Status   SARS Coronavirus 2 NEGATIVE NEGATIVE Final    Comment: (NOTE) SARS-CoV-2 target nucleic acids are NOT DETECTED. The SARS-CoV-2 RNA is generally detectable in upper and lower respiratory specimens during the acute phase of infection. Negative results do not preclude SARS-CoV-2 infection, do not rule out co-infections with other pathogens, and should not be used as the sole basis for treatment or other patient management decisions. Negative results must be combined with clinical observations, patient history, and epidemiological information. The expected result is Negative. Fact Sheet for Patients: SugarRoll.be Fact Sheet for Healthcare Providers: https://www.woods-mathews.com/ This test is not yet approved or cleared by the [place] FDA and  has been authorized for detection and/or diagnosis of SARS-CoV-2 by FDA under an Emergency Use Authorization (EUA). This EUA will remain  in effect (meaning this test can be used) for the duration of the COVID-19 declaration under Section 56 4(b)(1) of the Act, 21 U.S.C. section 360bbb-3(b)(1),  unless the authorization is terminated or revoked sooner. Performed at [facility], [place] [address], [place], [place] [address]   Aerobic/Anaerobic Culture (surgical/deep wound)     Status: None   Collection Time: 04/16/19  3:24 PM   Specimen: PATH Other; Wound  Result Value Ref Range Status   Specimen Description   Final    WOUND left ankle Performed at [place] [address], [place], [place] [address]    Special Requests   Final    NONE Performed at [facility], [place] [place], [place] [address]    Gram Stain   Final    FEW  WBC PRESENT, PREDOMINANTLY PMN RARE GRAM POSITIVE COCCI    Culture   Final    RARE STAPHYLOCOCCUS AUREUS NO ANAEROBES ISOLATED Performed at [facility], [place] [address], [place], [place] [address]    Report Status 04/21/2019 FINAL  Final   Organism ID, Bacteria STAPHYLOCOCCUS AUREUS  Final      Susceptibility   Staphylococcus aureus - MIC*    CIPROFLOXACIN >=8 RESISTANT Resistant     ERYTHROMYCIN <=0.25 SENSITIVE Sensitive     GENTAMICIN <=0.5 SENSITIVE Sensitive     OXACILLIN <=0.25 SENSITIVE Sensitive     TETRACYCLINE <=1 SENSITIVE Sensitive     VANCOMYCIN 1 SENSITIVE Sensitive     TRIMETH/SULFA <=10 SENSITIVE Sensitive     CLINDAMYCIN <=0.25 SENSITIVE Sensitive     RIFAMPIN <=0.5 SENSITIVE Sensitive     Inducible Clindamycin NEGATIVE Sensitive     * RARE STAPHYLOCOCCUS AUREUS         Radiology Studies: No results found.      Scheduled Meds: . amLODipine  10 mg Oral Daily  . aspirin EC  81 mg Oral Daily  . benazepril  20 mg Oral BID  . Chlorhexidine Gluconate Cloth  6 each Topical Daily  . clopidogrel  75 mg Oral Daily  . DULoxetine  30 mg Oral QPM  . DULoxetine  60 mg Oral Daily  . enoxaparin (LOVENOX) injection  40 mg Subcutaneous Q24H  . ezetimibe  10 mg Oral Daily  . feeding supplement (PRO-STAT SUGAR FREE 64)  30 mL Oral Daily  . fenofibrate  54 mg Oral q1800  . furosemide  20 mg Oral Daily  . hydrochlorothiazide  12.5 mg Oral Daily  . insulin aspart  0-20 Units Subcutaneous TID WC  . insulin detemir  48 Units Subcutaneous QHS  . linaclotide  290 mcg Oral QAC breakfast  . multivitamin with minerals  1 tablet Oral Daily  . omega-3 acid ethyl esters  1 g Oral BID  . oxcarbazepine  600 mg Oral BID  . oxyCODONE  20 mg Oral Q12H  . pantoprazole  40 mg Oral Daily  . pregabalin  200 mg Oral Daily  . pregabalin  400 mg Oral Daily  . sodium chloride flush  10-40 mL Intracatheter Q12H  . tamsulosin  0.4 mg Oral q1800  . vitamin C  500 mg Oral BID    Continuous Infusions: .  ceFAZolin (ANCEF) IV 2 g (04/22/19 1459)     LOS: 12 days    Time spent: 15-20 minutes    Ezekiel Slocumb, DO [facility] Pager: [phone]  If 7PM-7AM, please contact night-coverage [email or web address] Password [email or web address] 04/22/2019, 4:25 PM

## 2019-04-22 NOTE — Progress Notes (Signed)
Physical Therapy Treatment Patient Details Name: Victor Wise MRN: [id number] DOB: Aug 03, 1950 Today's Date: 04/22/2019    History of Present Illness Pt. is a 68 yo male who was readmitted from SNF with a gradually declining situation on L ankle, with wound dehiscence 7 weeks post op, after going to a walking boot.  Wound declined, then was taken to surgery on 11/27, with I and D, removal of L fibular hardware and exchange of wound vac.  Pt is NWB.  PMHx:  pacemaker, CAD, stent, DM, depression, GERD, HTN    PT Comments    Pt presented with deficits in strength, transfers, mobility, gait, balance, and activity tolerance.  Pt pleasant and actively participated throughout the session.  Pt required physical assistance with bed mobility tasks this session and required cuing for correct posture in sitting at the EOB.  Multiple attempts made at sit to/from stand transfers with +2 assist from elevated EOB but pt was unable to clear the surface of the bed.  Max verbal cues given to prevent pt using LLE during transfer attempts with poor carryover.  Sliding board transfer training deferred for pt safety secondary to significant functional weakness in sitting.  Nursing notified and encouraged to use hoyer lift for bed/chair transfers.  Pt will benefit from PT services in a SNF setting upon discharge to safely address above deficits for decreased caregiver assistance and eventual return to PLOF.        Follow Up Recommendations  SNF     Equipment Recommendations  None recommended by PT    Recommendations for Other Services       Precautions / Restrictions Precautions Precautions: Fall Precaution Comments: LLE NWB with cast and wound vac Restrictions Weight Bearing Restrictions: Yes LLE Weight Bearing: Non weight bearing    Mobility  Bed Mobility Overal bed mobility: Needs Assistance Bed Mobility: Supine to Sit;Sit to Supine     Supine to sit: Mod assist Sit to supine: Mod assist   General  bed mobility comments: Mod A for BLE and trunk control/positioning  Transfers                 General transfer comment: Multiple sit to/stand attempts made from elevated EOB with pt unable to clear the surface of the bed  Ambulation/Gait                 Stairs             Wheelchair Mobility    Modified Rankin (Stroke Patients Only)       Balance Overall balance assessment: Needs assistance Sitting-balance support: Bilateral upper extremity supported Sitting balance-Leahy Scale: Fair Sitting balance - Comments: Min verbal cues to correct posture with occasional anterior lean                                    Cognition Arousal/Alertness: Awake/alert Behavior During Therapy: Flat affect Overall Cognitive Status: Within Functional Limits for tasks assessed                                        Exercises Total Joint Exercises Ankle Circles/Pumps: Strengthening;Right;10 reps;15 reps Quad Sets: Strengthening;Both;10 reps;15 reps Gluteal Sets: Strengthening;Both;10 reps;15 reps Towel Squeeze: Strengthening;Both;10 reps;15 reps Short Arc Quad: Strengthening;Both;10 reps;15 reps Heel Slides: AROM;Right;10 reps;5 reps Hip ABduction/ADduction: Strengthening;Both;10 reps;5 reps Straight Leg  Raises: Strengthening;Both;10 reps;5 reps Long Arc Quad: AROM;Both;10 reps;15 reps Knee Flexion: AROM;Both;10 reps;15 reps Other Exercises Other Exercises: HEP education/review for RLE APs and BLE QS and GS x 10 each every 1-2 hours daily    General Comments        Pertinent Vitals/Pain Pain Assessment: 0-10 Pain Score: 8  Pain Location: Left Ankle Pain Descriptors / Indicators: Aching;Sore Pain Intervention(s): Monitored during session;RN gave pain meds during session    Home Living                      Prior Function            PT Goals (current goals can now be found in the care plan section) Progress towards PT  goals: Not progressing toward goals - comment(Pt limited by functional weakness and LLE NWB status)    Frequency    Min 2X/week      PT Plan Current plan remains appropriate    Co-evaluation              AM-PAC PT "6 Clicks" Mobility   Outcome Measure  Help needed turning from your back to your side while in a flat bed without using bedrails?: A Little Help needed moving from lying on your back to sitting on the side of a flat bed without using bedrails?: A Little Help needed moving to and from a bed to a chair (including a wheelchair)?: Total Help needed standing up from a chair using your arms (e.g., wheelchair or bedside chair)?: Total Help needed to walk in hospital room?: Total Help needed climbing 3-5 steps with a railing? : Total 6 Click Score: 10    End of Session Equipment Utilized During Treatment: Gait belt Activity Tolerance: Patient tolerated treatment well Patient left: in bed;with call bell/phone within reach;with bed alarm set Nurse Communication: Mobility status;Need for lift equipment;Patient requests pain meds PT Visit Diagnosis: Muscle weakness (generalized) (M62.81);Pain Pain - Right/Left: Left Pain - part of body: Leg;Ankle and joints of foot     Time: [id number] PT Time Calculation (min) (ACUTE ONLY): 43 min  Charges:  $Therapeutic Exercise: 23-37 mins $Therapeutic Activity: 8-22 mins                     D. Scott Tasheena Wambolt PT, DPT 04/22/19, 12:23 PM

## 2019-04-22 NOTE — TOC Progression Note (Signed)
Transition of Care [facility]) - Progression Note    Patient Details  Name: DENVIL ALEXIOU MRN: [id number] Date of Birth: Dec 07, 1950  Transition of Care [facility]) CM/SW [place], RN Phone Number: 04/22/2019, 3:48 PM  Clinical Narrative:    The patient lives at home alone, He was in a chair defecating and urinating on himself.  [facility] to [facility] from [facility] admission, he has used all of his days and now is in Copay days, he refused to set up a payment plan with [facility] and refused to apply for Medicaid per [facility]. Stated he makes too much for Medicaid at $1600.00 a month, and says he cant pay a dime for copay or payment plan.  They will not offer a bed again, Due to his not willing to pay anything out of pocket. He said he would go home with home health. When setting up for IV ABX for home related to an infected joint replacement , he is unable to pay copay and refuses to set up a payment plan He also refused to pay or set up a payment plan for the Wound Vac needed at home I had gotten him set up with [facility] with [facility] for SW, PT, OT, Nurse, and aide, He is non weight bearing and it would be an unsafe discharge home   Dr Mack Guise did a peer to peer due to [facility] Dr Jeri Cos denying LTAC, They agreed to approve. Auth Approval number [id number] for LTAC level  I contacted Doroteo Bradford at [facility] to make aware of the results from the peer to peer, and the [place] approval  When Doroteo Bradford was  verifying the insurance auth  with [facility] and then called to tell me that due to no ICU days they could not get payment from [facility] and could not take the patient I provided the director at [facility] [phone] the approval number and the claims department phone number so that she can verify that [facility] will pay the auth at [facility] Level.  She has called many times to get in touch with the UM approval to verify and is awaiting a call back   Expected Discharge Plan: [place] Barriers to Discharge: Continued Medical Work up  Expected Discharge Plan and Services Expected Discharge Plan: [place]   Discharge Planning Services: CM Consult   Living arrangements for the past 2 months: [place] Expected Discharge Date: 04/22/19               DME Arranged: N/A         HH Arranged: RN, PT, OT, Nurse's Aide, Social Work [employer] Agency: [facility] Date [place]: 04/15/19 Time [place]: [phone] Representative spoke with at [place]: [place] ([place]) Interventions    Readmission Risk Interventions No flowsheet data found.

## 2019-04-22 NOTE — Progress Notes (Signed)
Pt alert and oriented. Incontinent at times of urine and stool. No complaints of breakthrough pain. PICC patent and infuses without difficulty.

## 2019-04-22 NOTE — TOC Progression Note (Signed)
Transition of Care [facility]) - Progression Note    Patient Details  Name: Victor Wise MRN: [id number] Date of Birth: 06/06/1950  Transition of Care [facility]) CM/SW [place], RN Phone Number: 04/22/2019, 1:00 PM  Clinical Narrative:     Peer to peer completed and the insurance will approve, I notified Erika at [facility] and they will accept the patient today, she will call and get insurance auth number and call me back with a bed  Expected Discharge Plan: [place] Barriers to Discharge: Continued Medical Work up  Expected Discharge Plan and Services Expected Discharge Plan: [place]   Discharge Planning Services: CM Consult   Living arrangements for the past 2 months: [place]                 DME Arranged: N/A         HH Arranged: RN, PT, OT, Nurse's Aide, Social Work [facility] Agency: [facility] Date [place]: 04/15/19 Time [place]: 46 Representative spoke with at [place]: [place] ([place]) Interventions    Readmission Risk Interventions No flowsheet data found.

## 2019-04-22 NOTE — Progress Notes (Signed)
Subjective:  I spoke with Dr. Kathyrn Sheriff this morning from [facility] who approved LTAC for Victor Wise.  Patient's VAC dressing stopped functioning today.  Patient denies any shortness of breath chest pain or abdominal pain.  He states his left ankle pain is mild.  Objective:   VITALS:   Vitals:   04/21/19 1709 04/21/19 2100 04/21/19 2316 04/22/19 0816  BP: 117/70 131/64 (!) 143/63 113/66  Pulse: 60  60 61  Resp:   18 18  Temp: 98.1 F (36.7 C)  98.1 F (36.7 C) 97.8 F (36.6 C)  TempSrc: Oral  Oral Oral  SpO2: 98%  96% 96%  Weight:      Height:        PHYSICAL EXAM: Left lower extremity: I personally change the patient's back dressing sponge today using sterile technique.  I remove the cast at the bedside.  The bed of the patient's wound appeared healthy without necrosis.  Early granulation tissue was visualized.  Xeroform was placed in the bed of the wound along with a new vacuum sponge.  After the dressing change the vacuum dressing functioned at 125 mmHg.  Patient had no other issues with skin breakdown.  The dressing overlying the medial incision from hardware removal was also redressed.   LABS  Results for orders placed or performed during the hospital encounter of 04/10/19 (from the past 24 hour(s))  Glucose, capillary     Status: Abnormal   Collection Time: 04/21/19  5:05 PM  Result Value Ref Range   Glucose-Capillary 191 (H) 70 - 99 mg/dL  Glucose, capillary     Status: Abnormal   Collection Time: 04/21/19  8:43 PM  Result Value Ref Range   Glucose-Capillary 171 (H) 70 - 99 mg/dL  Glucose, capillary     Status: Abnormal   Collection Time: 04/22/19  8:15 AM  Result Value Ref Range   Glucose-Capillary 131 (H) 70 - 99 mg/dL   Comment 1 Notify RN    Comment 2 Document in Chart   Glucose, capillary     Status: Abnormal   Collection Time: 04/22/19 12:04 PM  Result Value Ref Range   Glucose-Capillary 168 (H) 70 - 99 mg/dL   Comment 1 Notify RN    Comment 2  Document in Chart     No results found.  Assessment/Plan: 6 Days Post-Op   Principal Problem:   Infected hardware in left lower extremity (HCC) Active Problems:   Mixed hyperlipidemia   Essential hypertension   OSA on CPAP   Benign essential hypertension   Coronary artery disease due to lipid rich plaque   Type 2 diabetes mellitus with hyperlipidemia (HCC)   Benign prostatic hyperplasia without lower urinary tract symptoms   Obesity, Class III, BMI 40-49.9 (morbid obesity) ([place])   Bimalleolar fracture of left ankle   Wound dehiscence, surgical   Pyogenic inflammation of bone (HCC)   Preop examination   Pressure injury of skin   Anemia   Hypokalemia   Diabetic polyneuropathy associated with type 2 diabetes mellitus (HCC)   Moderate episode of recurrent major depressive disorder ([place])  Patient will continue IV Ancef every 8 hours per infectious disease.  I spoke with our case manager Delilah Belenda Cruise who is going to continue to make contact with [employer] in the LTAC to determine if the patient can be discharged to their facility today.  According to the facility [employer] is not willing to reimburse at an LTAC rate further services.  Patient will remain in the  hospitalized for care until a decision can be reached about whether patient can be transferred to [facility].    Victor Wise , MD 04/22/2019, 2:48 PM

## 2019-04-22 NOTE — Discharge Summary (Addendum)
Physician Discharge Summary  Patient ID: Victor Wise MRN: [id number] DOB/AGE: December 08, 1950 68 y.o.  Admit date: 04/10/2019 Discharge date: 04/23/2019  Admission Diagnoses:  T81.30XD Disruption of wound Infected hardware in left lower extremity [facility])  Discharge Diagnoses:  T81.30XD Disruption of wound Principal Problem:   Infected hardware in left lower extremity (Victor Wise) Active Problems:   Mixed hyperlipidemia   Essential hypertension   OSA on CPAP   Benign essential hypertension   Coronary artery disease due to lipid rich plaque   Type 2 diabetes mellitus with hyperlipidemia (HCC)   Benign prostatic hyperplasia without lower urinary tract symptoms   Obesity, Class III, BMI 40-49.9 (morbid obesity) (Victor Wise)   Bimalleolar fracture of left ankle   Wound dehiscence, surgical   Pyogenic inflammation of bone (HCC)   Preop examination   Pressure injury of skin   Anemia   Hypokalemia   Diabetic polyneuropathy associated with type 2 diabetes mellitus (HCC)   Moderate episode of recurrent major depressive disorder (HCC)   Past Medical History:  Diagnosis Date  . Allergy   . Basal cell carcinoma    forehead  . BPH (benign prostatic hyperplasia)   . CHF (congestive heart failure) ([place])   . Chronic back pain   . Coronary artery disease   . Depression   . Diabetes ([place])   . GERD (gastroesophageal reflux disease)   . Heart attack (Victor Wise)    2018  . Hypertension   . Morbid obesity (Victor Wise)   . Obstructive sleep apnea   . Osteomyelitis of foot (Victor Wise)   . Status post insertion of spinal cord stimulator   . Stroke [facility])    last one 2-3 years ago  . UTI (lower urinary tract infection)     Surgeries: Procedure(s): APPLICATION OF WOUND VAC APPLICATION OF A-CELL OF EXTREMITY on 04/16/2019   Consultants (if any): Treatment Team:  Loletha Grayer, MD Tsosie Billing, MD  Discharged Condition: Improved  Hospital Course: Victor Wise is an 68 y.o. male who was admitted 04/10/2019  with a diagnosis of  T81.30XD Disruption of wound Infected hardware in left lower extremity (Victor Wise) and went to the operating room on 04/16/2019 and underwent I&D, hardware removal and VAC dressing application on [id number]/[id number].  Cultures were sent from the OR.  They returned methicillin sensitive staph aureus.  Patient was changed to cefazolin IV 2 g every 8 hours per the infectious disease consultation.  Patient returned to the operating room for an [facility] dressing change on 04/16/19.  Patient is continued IV antibiotics.  Patient remained in the hospital for IV antibiotic treatment and VAC dressing treatment until approved for LTAC by [facility].  He was given perioperative antibiotics:  Anti-infectives (From admission, onward)   Start     Dose/Rate Route Frequency Ordered Stop   04/22/19 0000  ceFAZolin (ANCEF) IVPB     2 g Intravenous Every 8 hours 04/22/19 1504 05/28/19 2359   04/22/19 0000  ceFAZolin (ANCEF) 2-4 GM/100ML-% IVPB     2 g Intravenous Every 8 hours 04/22/19 1504 06/21/19 2359   04/16/19 0600  ceFAZolin (ANCEF) IVPB 2g/100 mL premix     2 g 200 mL/hr over 30 Minutes Intravenous Every 8 hours 04/15/19 1146     04/15/19 0400  vancomycin (VANCOCIN) 2,000 mg in sodium chloride 0.9 % 500 mL IVPB  Status:  Discontinued     2,000 mg 250 mL/hr over 120 Minutes Intravenous Every 24 hours 04/14/19 1555 04/15/19 0719   04/12/19 1000  cefTRIAXone (ROCEPHIN) 2  g in sodium chloride 0.9 % 100 mL IVPB  Status:  Discontinued     2 g 200 mL/hr over 30 Minutes Intravenous Every 24 hours 04/12/19 0815 04/15/19 1146   04/11/19 1600  vancomycin (VANCOCIN) 1,250 mg in sodium chloride 0.9 % 250 mL IVPB  Status:  Discontinued     1,250 mg 166.7 mL/hr over 90 Minutes Intravenous Every 12 hours 04/11/19 1427 04/14/19 1555   04/11/19 1200  levofloxacin (LEVAQUIN) IVPB 500 mg  Status:  Discontinued     500 mg 100 mL/hr over 60 Minutes Intravenous Every 24 hours 04/11/19 1157 04/12/19 0815   04/11/19 0300  vancomycin  (VANCOCIN) IVPB 1000 mg/200 mL premix  Status:  Discontinued     1,000 mg 200 mL/hr over 60 Minutes Intravenous Every 12 hours 04/10/19 1402 04/10/19 1409   04/10/19 1900  vancomycin (VANCOCIN) IVPB 1000 mg/200 mL premix     1,000 mg 200 mL/hr over 60 Minutes Intravenous  Once 04/10/19 1840 04/10/19 2045   04/10/19 1430  vancomycin (VANCOCIN) 1,500 mg in sodium chloride 0.9 % 500 mL IVPB  Status:  Discontinued     1,500 mg 250 mL/hr over 120 Minutes Intravenous  Once 04/10/19 1356 04/10/19 1410   04/10/19 1000  azithromycin (ZITHROMAX) 500 mg in sodium chloride 0.9 % 250 mL IVPB     500 mg 250 mL/hr over 60 Minutes Intravenous  Once 04/10/19 0946 04/10/19 1151   04/10/19 1000  vancomycin (VANCOCIN) IVPB 1000 mg/200 mL premix     1,000 mg 200 mL/hr over 60 Minutes Intravenous  Once 04/10/19 0946 04/10/19 1338    .  He was given sequential compression devices, early ambulation, and Lovenox and Plavix for DVT prophylaxis.  He benefited maximally from the hospital stay and there were no complications.    Recent vital signs:  Vitals:   04/21/19 2316 04/22/19 0816  BP: (!) 143/63 113/66  Pulse: 60 61  Resp: 18 18  Temp: 98.1 F (36.7 C) 97.8 F (36.6 C)  SpO2: 96% 96%    Recent laboratory studies:  Lab Results  Component Value Date   HGB 8.5 (L) 04/18/2019   HGB 8.0 (L) 04/16/2019   HGB 7.9 (L) 04/15/2019   Lab Results  Component Value Date   WBC 4.2 04/18/2019   PLT 206 04/18/2019   Lab Results  Component Value Date   INR 1.1 04/10/2019   Lab Results  Component Value Date   NA 144 04/21/2019   K 3.7 04/21/2019   CL 104 04/21/2019   CO2 28 04/21/2019   BUN 25 (H) 04/21/2019   CREATININE 0.84 04/21/2019   GLUCOSE 183 (H) 04/21/2019    Discharge Medications:   Allergies as of 04/22/2019      Reactions   Amoxicillin Other (See Comments)   Has never taken amoxicillin -ENT told him not to take (? Had skin test) Has patient had a PCN reaction causing immediate  rash, facial/tongue/throat swelling, SOB or lightheadedness with hypotension: No Has patient had a PCN reaction causing severe rash involving mucus membranes or skin necrosis: No Has patient had a PCN reaction that required hospitalization No Has patient had a PCN reaction occurring within the last 10 years: No If above answers are "NO", then may proceed w/ Cephalo   Other Anaphylaxis, Itching, Other (See Comments)   Pt states that he is allergic to Endopa.  Reaction:  Anaphylaxis  Pt states that he is allergic to Metabisulfites Reaction:  Itching    Penicillins  Other (See Comments)   Arm turned "blue" at injection site (no treatment needed) Has patient had a PCN reaction causing immediate rash, facial/tongue/throat swelling, SOB or lightheadedness with hypotension: No Has patient had a PCN reaction causing severe rash involving mucus membranes or skin necrosis: No Has patient had a PCN reaction that required hospitalization No Has patient had a PCN reaction occurring within the last 10 years: No If all of the above answers are "NO", then may proceed with Cephalosporin use.   Hydralazine Other (See Comments)   Reaction:  Cramping of extremities Pt reports tolerating low dose 12.78m but no higher.   Cephalexin Itching   Clindamycin Itching   Crestor [rosuvastatin Calcium] Other (See Comments)   Reaction:  Pt is unable to move arms/legs    Metoprolol Nausea And Vomiting   Red Dye Itching   Statins Other (See Comments)   Reaction:  Pt is unable to move arms/legs   Sulfa Antibiotics Itching   Other reaction(s): Unknown   Yellow Dyes (non-tartrazine) Itching   Aspirin Itching, Other (See Comments)   Pt states that he is able to use in lower doses.     Tape Rash   Please use "paper" tape only.      Medication List    TAKE these medications   acetaminophen 650 MG CR tablet Commonly known as: TYLENOL Take 650 mg by mouth 2 (two) times daily.   amLODipine 10 MG tablet Commonly known  as: NORVASC TAKE 1 TABLET BY MOUTH ONCE DAILY   aspirin EC 81 MG tablet Take 81 mg by mouth daily.   benazepril 20 MG tablet Commonly known as: LOTENSIN TAKE 1 TABLET BY MOUTH TWICE DAILY   Bydureon 2 MG Pen Generic drug: Exenatide ER Inject 2 mg into the skin once a week. What changed: when to take this   ceFAZolin  IVPB Commonly known as: ANCEF Inject 2 g into the vein every 8 (eight) hours. Indication: MSSA ankle infection Last Day of Therapy: 05/26/2019 Labs - Once weekly:  CBC/D, CMP,  ESR and CRP   ceFAZolin 2-4 GM/100ML-% IVPB Commonly known as: ANCEF Inject 100 mLs (2 g total) into the vein every 8 (eight) hours.   clopidogrel 75 MG tablet Commonly known as: PLAVIX Take 1 tablet (75 mg total) by mouth daily.   DULoxetine 60 MG capsule Commonly known as: CYMBALTA Take 60 mg by mouth daily.   DULoxetine 30 MG capsule Commonly known as: CYMBALTA Take 30 mg by mouth every evening.   enoxaparin 40 MG/0.4ML injection Commonly known as: LOVENOX Inject 0.4 mLs (40 mg total) into the skin daily.   ezetimibe 10 MG tablet Commonly known as: ZETIA Take 10 mg by mouth daily.   feeding supplement (PRO-STAT SUGAR FREE 64) Liqd Take 30 mLs by mouth daily.   fenofibrate 54 MG tablet TAKE 1 TABLET BY MOUTH A DAY AT SUPPERTIME What changed:   how much to take  how to take this  when to take this  additional instructions   furosemide 20 MG tablet Commonly known as: LASIX Take 20 mg by mouth daily.   hydrochlorothiazide 12.5 MG tablet Commonly known as: HYDRODIURIL Take 1 tablet (12.5 mg total) by mouth daily.   insulin NPH Human 100 UNIT/ML injection Commonly known as: NOVOLIN N Inject 45 Units into the skin 2 (two) times daily.   insulin regular 100 units/mL injection Commonly known as: NovoLIN R Patient to use Novolin R [place] TiD prior to meals per sliding scale instructions.  Max daily dose is 45 units per day. What changed:   how much to take  how to  take this  when to take this  additional instructions   lactulose 10 GM/15ML solution Commonly known as: CHRONULAC Take 20 g by mouth See admin instructions. Take 20 g in the evening, may take an additional 20 g up to 2 times daily as needed for constipation   lidocaine 5 % Commonly known as: LIDODERM Place 1 patch onto the skin daily as needed (pain). Remove & Discard patch within 12 hours or as directed by MD   Linzess 145 MCG Caps capsule Generic drug: linaclotide Take 290 mcg by mouth daily before breakfast.   multivitamin with minerals Tabs tablet Take 1 tablet by mouth daily.   Narcan 4 MG/0.1ML Liqd nasal spray kit Generic drug: naloxone Place 4 mg into the nose as needed (for opioid overdose).   omega-3 acid ethyl esters 1 g capsule Commonly known as: LOVAZA Take 1 capsule (1 g total) by mouth 2 (two) times daily.   oxcarbazepine 600 MG tablet Commonly known as: TRILEPTAL Take 600 mg by mouth 2 (two) times daily.   oxyCODONE 5 MG immediate release tablet Commonly known as: Oxy IR/ROXICODONE Take 1 tablet (5 mg total) by mouth every 4 (four) hours as needed for breakthrough pain ((for MODERATE breakthrough pain)). What changed: You were already taking a medication with the same name, and this prescription was added. Make sure you understand how and when to take each.   oxyCODONE 20 mg 12 hr tablet Commonly known as: OXYCONTIN Take 1 tablet (20 mg total) by mouth every 12 (twelve) hours. What changed: Another medication with the same name was added. Make sure you understand how and when to take each.   pantoprazole 40 MG tablet Commonly known as: PROTONIX TAKE 1 TABLET BY MOUTH ONCE DAILY FOR REFLUX What changed:   how much to take  how to take this  when to take this  additional instructions   polyethylene glycol 17 g packet Commonly known as: MIRALAX / GLYCOLAX Take 17 g by mouth 2 (two) times daily.   pregabalin 200 MG capsule Commonly known as:  LYRICA Take 400 mg by mouth 2 (two) times daily.   tamsulosin 0.4 MG Caps capsule Commonly known as: FLOMAX TAKE 1 CAPSULE BY MOUTH ONCE DAILY AFTERSUPPER What changed: See the new instructions.   vitamin C 250 MG tablet Commonly known as: ASCORBIC ACID Take 500 mg by mouth 2 (two) times daily.       Diagnostic Studies: Dg Ankle Complete Left  Result Date: 04/10/2019 CLINICAL DATA:  Left ankle pain and swelling. Skin ulceration on the lateral aspect of the ankle. History of prior fracture fixation. EXAM: LEFT ANKLE COMPLETE - 3+ VIEW COMPARISON:  Plain films left ankle 03/23/2019. FINDINGS: Plate and screw fixation of a distal fibular fracture is identified. There is a large skin ulceration over the distal aspect of the plate. Lucency in bone at the level of and interfragmentary screw and distal aspect of the plate is worrisome for osteomyelitis. The patient also has 2 screws in the medial malleolus for fixation of a fracture. No bridging bone is identified and there is lucency about the screws. Cortical destruction is seen in the anterior aspect of the distal tibia. There is an ankle joint effusion. Soft tissues about the ankle are swollen. IMPRESSION: Findings highly suspicious for septic ankle joint with osteomyelitis in both the distal tibia and lateral malleolus as described  above. Electronically Signed   By: Inge Rise M.D.   On: 04/10/2019 09:51   Dg Ankle Left Port  Result Date: 04/12/2019 CLINICAL DATA:  Status post hardware removal. EXAM: PORTABLE LEFT ANKLE - 2 VIEW COMPARISON:  April 10, 2019. FINDINGS: Fixation hardware involving distal left fibula has been removed. No acute fracture or dislocation is noted. Wound VAC device is seen overlying the lateral portion of the distal left ankle. Lucencies noted on prior exam are not well visualized currently due to overlying cast. IMPRESSION: Fixation hardware involving distal left fibula has been removed. No acute fracture or  dislocation is noted. Electronically Signed   By: Marijo Conception M.D.   On: 04/12/2019 14:09   [place] Ekg Site Rite  Result Date: 04/15/2019 If Site Rite image not attached, placement could not be confirmed due to current cardiac rhythm.   Disposition: Discharge disposition: 01-Home or Self Care       Discharge Instructions    Call MD / Call 911   Complete by: As directed    If you experience chest pain or shortness of breath, CALL 911 and be transported to the hospital emergency room.  If you develope a fever above 101 F, pus (white drainage) or increased drainage or redness at the wound, or calf pain, call your surgeon's office.   Constipation Prevention   Complete by: As directed    Drink plenty of fluids.  Prune juice may be helpful.  You may use a stool softener, such as Colace (over the counter) 100 mg twice a day.  Use MiraLax (over the counter) for constipation as needed.   Diet general   Complete by: As directed    Discharge instructions   Complete by: As directed    Patient needs to continue IV antibiotics per infectious disease for a total of 6-8 weeks.  Patient should continue elevation of the left lower extremity whenever possible.  Patient should wear his splint at all times as his left ankle fracture is not yet completely healed.  Patient is nonweightbearing on the left lower extremity.  Continue VAC dressing for at least 4 weeks with sponge changes weekly.  Patient should follow-up in the [facility] office in [place] to see Dr. Thornton Park in 1 to 2 weeks.  Phone number is [phone]   Driving restrictions   Complete by: As directed    No driving for 5-05 weeks   Increase activity slowly as tolerated   Complete by: As directed    Lifting restrictions   Complete by: As directed    No lifting for 12-16 weeks         Signed: Thornton Park ,MD 04/22/2019, 3:09 PM

## 2019-04-23 ENCOUNTER — Inpatient Hospital Stay
Admit: 2019-04-23 | Discharge: 2019-05-09 | Disposition: A | Discharge status: Skilled Nursing Facility | Payer: Medicare HMO | Source: Other Acute Inpatient Hospital | Attending: Internal Medicine | Admitting: Internal Medicine

## 2019-04-23 DIAGNOSIS — R269 Unspecified abnormalities of gait and mobility: Secondary | ICD-10-CM | POA: Diagnosis not present

## 2019-04-23 DIAGNOSIS — R5381 Other malaise: Secondary | ICD-10-CM | POA: Diagnosis not present

## 2019-04-23 DIAGNOSIS — I509 Heart failure, unspecified: Secondary | ICD-10-CM | POA: Diagnosis not present

## 2019-04-23 DIAGNOSIS — S81802A Unspecified open wound, left lower leg, initial encounter: Secondary | ICD-10-CM | POA: Diagnosis not present

## 2019-04-23 DIAGNOSIS — T8131XA Disruption of external operation (surgical) wound, not elsewhere classified, initial encounter: Secondary | ICD-10-CM | POA: Diagnosis not present

## 2019-04-23 DIAGNOSIS — M545 Low back pain: Secondary | ICD-10-CM | POA: Diagnosis not present

## 2019-04-23 DIAGNOSIS — I10 Essential (primary) hypertension: Secondary | ICD-10-CM | POA: Diagnosis not present

## 2019-04-23 DIAGNOSIS — M6281 Muscle weakness (generalized): Secondary | ICD-10-CM | POA: Diagnosis not present

## 2019-04-23 DIAGNOSIS — Z7401 Bed confinement status: Secondary | ICD-10-CM | POA: Diagnosis not present

## 2019-04-23 DIAGNOSIS — Z4789 Encounter for other orthopedic aftercare: Secondary | ICD-10-CM | POA: Diagnosis not present

## 2019-04-23 DIAGNOSIS — Z743 Need for continuous supervision: Secondary | ICD-10-CM | POA: Diagnosis not present

## 2019-04-23 DIAGNOSIS — I252 Old myocardial infarction: Secondary | ICD-10-CM | POA: Diagnosis not present

## 2019-04-23 DIAGNOSIS — M86172 Other acute osteomyelitis, left ankle and foot: Secondary | ICD-10-CM | POA: Diagnosis not present

## 2019-04-23 DIAGNOSIS — F1120 Opioid dependence, uncomplicated: Secondary | ICD-10-CM | POA: Diagnosis not present

## 2019-04-23 DIAGNOSIS — R279 Unspecified lack of coordination: Secondary | ICD-10-CM | POA: Diagnosis not present

## 2019-04-23 DIAGNOSIS — F419 Anxiety disorder, unspecified: Secondary | ICD-10-CM | POA: Diagnosis not present

## 2019-04-23 DIAGNOSIS — R262 Difficulty in walking, not elsewhere classified: Secondary | ICD-10-CM | POA: Diagnosis not present

## 2019-04-23 DIAGNOSIS — I2510 Atherosclerotic heart disease of native coronary artery without angina pectoris: Secondary | ICD-10-CM | POA: Diagnosis not present

## 2019-04-23 DIAGNOSIS — G894 Chronic pain syndrome: Secondary | ICD-10-CM | POA: Diagnosis not present

## 2019-04-23 DIAGNOSIS — R52 Pain, unspecified: Secondary | ICD-10-CM | POA: Diagnosis not present

## 2019-04-23 DIAGNOSIS — E785 Hyperlipidemia, unspecified: Secondary | ICD-10-CM | POA: Diagnosis not present

## 2019-04-23 DIAGNOSIS — I110 Hypertensive heart disease with heart failure: Secondary | ICD-10-CM | POA: Diagnosis not present

## 2019-04-23 DIAGNOSIS — E669 Obesity, unspecified: Secondary | ICD-10-CM | POA: Diagnosis not present

## 2019-04-23 DIAGNOSIS — E119 Type 2 diabetes mellitus without complications: Secondary | ICD-10-CM | POA: Diagnosis not present

## 2019-04-23 DIAGNOSIS — Z6841 Body Mass Index (BMI) 40.0 and over, adult: Secondary | ICD-10-CM | POA: Diagnosis not present

## 2019-04-23 DIAGNOSIS — R2689 Other abnormalities of gait and mobility: Secondary | ICD-10-CM | POA: Diagnosis not present

## 2019-04-23 DIAGNOSIS — S91002S Unspecified open wound, left ankle, sequela: Secondary | ICD-10-CM | POA: Diagnosis not present

## 2019-04-23 DIAGNOSIS — E43 Unspecified severe protein-calorie malnutrition: Secondary | ICD-10-CM | POA: Diagnosis not present

## 2019-04-23 DIAGNOSIS — G4733 Obstructive sleep apnea (adult) (pediatric): Secondary | ICD-10-CM | POA: Diagnosis not present

## 2019-04-23 DIAGNOSIS — E440 Moderate protein-calorie malnutrition: Secondary | ICD-10-CM | POA: Diagnosis not present

## 2019-04-23 DIAGNOSIS — M2550 Pain in unspecified joint: Secondary | ICD-10-CM | POA: Diagnosis not present

## 2019-04-23 DIAGNOSIS — T8459XA Infection and inflammatory reaction due to other internal joint prosthesis, initial encounter: Secondary | ICD-10-CM | POA: Diagnosis not present

## 2019-04-23 DIAGNOSIS — R2681 Unsteadiness on feet: Secondary | ICD-10-CM | POA: Diagnosis not present

## 2019-04-23 DIAGNOSIS — Z472 Encounter for removal of internal fixation device: Secondary | ICD-10-CM | POA: Diagnosis not present

## 2019-04-23 DIAGNOSIS — M868X7 Other osteomyelitis, ankle and foot: Secondary | ICD-10-CM | POA: Diagnosis not present

## 2019-04-23 LAB — GLUCOSE, CAPILLARY
Glucose-Capillary: 144 mg/dL — ABNORMAL HIGH (ref 70–99)
Glucose-Capillary: 174 mg/dL — ABNORMAL HIGH (ref 70–99)
Glucose-Capillary: 198 mg/dL — ABNORMAL HIGH (ref 70–99)

## 2019-04-23 NOTE — Progress Notes (Signed)
Attempted to call report

## 2019-04-23 NOTE — Progress Notes (Signed)
  Subjective:  Patient is VAC dressing has stopped working again.  Patient denies any significant ankle pain.  Objective:   VITALS:   Vitals:   04/22/19 0816 04/22/19 1652 04/23/19 0035 04/23/19 0743  BP: 113/66 (!) 116/57 127/66 (!) 141/71  Pulse: 61 60 64 60  Resp: 18 20 16    Temp: 97.8 F (36.6 C) 98 F (36.7 C) 98.5 F (36.9 C)   TempSrc: Oral Oral Oral   SpO2: 96% 97% 98% 100%  Weight:      Height:        PHYSICAL EXAM: Left lower extremity; Splint and dressing are clean dry and intact.  VAC dressing is no longer providing suction.  Wound is covered.  Foot remains perfused.   LABS  Results for orders placed or performed during the hospital encounter of 04/10/19 (from the past 24 hour(s))  Glucose, capillary     Status: Abnormal   Collection Time: 04/22/19  4:47 PM  Result Value Ref Range   Glucose-Capillary 245 (H) 70 - 99 mg/dL  Glucose, capillary     Status: Abnormal   Collection Time: 04/22/19  9:18 PM  Result Value Ref Range   Glucose-Capillary 209 (H) 70 - 99 mg/dL   Comment 1 Notify RN   Glucose, capillary     Status: Abnormal   Collection Time: 04/23/19  7:44 AM  Result Value Ref Range   Glucose-Capillary 144 (H) 70 - 99 mg/dL  Glucose, capillary     Status: Abnormal   Collection Time: 04/23/19 11:50 AM  Result Value Ref Range   Glucose-Capillary 174 (H) 70 - 99 mg/dL    No results found.  Assessment/Plan: 7 Days Post-Op   Principal Problem:   Infected hardware in left lower extremity (HCC) Active Problems:   Mixed hyperlipidemia   Essential hypertension   OSA on CPAP   Benign essential hypertension   Coronary artery disease due to lipid rich plaque   Type 2 diabetes mellitus with hyperlipidemia (HCC)   Benign prostatic hyperplasia without lower urinary tract symptoms   Obesity, Class III, BMI 40-49.9 (morbid obesity) ([place])   Bimalleolar fracture of left ankle   Wound dehiscence, surgical   Pyogenic inflammation of bone (HCC)   Preop  examination   Pressure injury of skin   Anemia   Hypokalemia   Diabetic polyneuropathy associated with type 2 diabetes mellitus (HCC)   Moderate episode of recurrent major depressive disorder ([place])  Patient will be scheduled for the OR Wednesday or Thursday for wound washout and VAC replacement.  Patient will remain nonweightbearing on the left lower extremity.  Continue IV antibiotics as recommended by infectious disease.    Thornton Park , MD 04/23/2019, 1:18 PM

## 2019-04-23 NOTE — TOC Progression Note (Signed)
Transition of Care [facility]) - Progression Note    Patient Details  Name: OSEIAS REGENOLD MRN: [id number] Date of Birth: 1950/12/18  [facility] CM/SW Contact  Su Hilt, RN Phone Number: 04/23/2019, 1:54 PM  Clinical Narrative:    Received a call from Leilani Able from [place], they have talked to [facility] and it is agreed that they will pay for the LTAC level, they are wanting to take the patient today and they will be able to do any I and D and wound care that is needed.  I paged Dr Samara Deist to notify him of the bed    Expected Discharge Plan: [place] Barriers to Discharge: Continued Medical Work up  Expected Discharge Plan and Services Expected Discharge Plan: [place]   Discharge Planning Services: CM Consult   Living arrangements for the past 2 months: [place] Expected Discharge Date: 04/22/19               DME Arranged: N/A         HH Arranged: RN, PT, OT, Nurse's Aide, Social Work [employer] Agency: [facility] Date [place]: 04/15/19 Time [place]: [phone] Representative spoke with at [place]: [place] ([place]) Interventions    Readmission Risk Interventions No flowsheet data found.

## 2019-04-23 NOTE — Progress Notes (Signed)
Called report to [place] at [facility]

## 2019-04-24 LAB — TSH: TSH: 0.11 u[IU]/mL — ABNORMAL LOW (ref 0.350–4.500)

## 2019-04-24 LAB — COMPREHENSIVE METABOLIC PANEL
ALT: 7 U/L (ref 0–44)
AST: 11 U/L — ABNORMAL LOW (ref 15–41)
Albumin: 2.6 g/dL — ABNORMAL LOW (ref 3.5–5.0)
Alkaline Phosphatase: 59 U/L (ref 38–126)
Anion gap: 9 (ref 5–15)
BUN: 17 mg/dL (ref 8–23)
CO2: 28 mmol/L (ref 22–32)
Calcium: 8.6 mg/dL — ABNORMAL LOW (ref 8.9–10.3)
Chloride: 103 mmol/L (ref 98–111)
Creatinine, Ser: 0.87 mg/dL (ref 0.61–1.24)
GFR calc Af Amer: >60 mL/min (ref >60)
GFR calc non Af Amer: >60 mL/min (ref >60)
Glucose, Bld: 218 mg/dL — ABNORMAL HIGH (ref 70–99)
Potassium: 4.1 mmol/L (ref 3.5–5.1)
Sodium: 140 mmol/L (ref 135–145)
Total Bilirubin: <0.1 mg/dL — ABNORMAL LOW (ref 0.3–1.2)
Total Protein: 5.5 g/dL — ABNORMAL LOW (ref 6.5–8.1)

## 2019-04-24 LAB — URINALYSIS, ROUTINE W REFLEX MICROSCOPIC
Bilirubin Urine: NEGATIVE
Glucose, UA: NEGATIVE mg/dL
Hgb urine dipstick: NEGATIVE
Ketones, ur: NEGATIVE mg/dL
Leukocytes,Ua: NEGATIVE
Nitrite: NEGATIVE
Protein, ur: NEGATIVE mg/dL
Specific Gravity, Urine: 1.014 (ref 1.005–1.030)
pH: 7 (ref 5.0–8.0)

## 2019-04-24 LAB — PROTIME-INR
INR: 1.1 (ref 0.8–1.2)
Prothrombin Time: 13.9 seconds (ref 11.4–15.2)

## 2019-04-24 LAB — CBC WITH DIFFERENTIAL/PLATELET
Abs Immature Granulocytes: 0.06 10*3/uL (ref 0.00–0.07)
Basophils Absolute: 0 10*3/uL (ref 0.0–0.1)
Basophils Relative: 1 %
Eosinophils Absolute: 0.2 10*3/uL (ref 0.0–0.5)
Eosinophils Relative: 4 %
HCT: 25.9 % — ABNORMAL LOW (ref 39.0–52.0)
Hemoglobin: 8 g/dL — ABNORMAL LOW (ref 13.0–17.0)
Immature Granulocytes: 1 %
Lymphocytes Relative: 40 %
Lymphs Abs: 1.8 10*3/uL (ref 0.7–4.0)
MCH: 24.5 pg — ABNORMAL LOW (ref 26.0–34.0)
MCHC: 30.9 g/dL (ref 30.0–36.0)
MCV: 79.4 fL — ABNORMAL LOW (ref 80.0–100.0)
Monocytes Absolute: 0.3 10*3/uL (ref 0.1–1.0)
Monocytes Relative: 7 %
Neutro Abs: 2.1 10*3/uL (ref 1.7–7.7)
Neutrophils Relative %: 47 %
Platelets: 218 10*3/uL (ref 150–400)
RBC: 3.26 MIL/uL — ABNORMAL LOW (ref 4.22–5.81)
RDW: 16.4 % — ABNORMAL HIGH (ref 11.5–15.5)
WBC: 4.4 10*3/uL (ref 4.0–10.5)
nRBC: 0 % (ref 0.0–0.2)

## 2019-04-24 LAB — PHOSPHORUS: Phosphorus: 3.3 mg/dL (ref 2.5–4.6)

## 2019-04-24 LAB — HEMOGLOBIN A1C
Hgb A1c MFr Bld: 7.6 % — ABNORMAL HIGH (ref 4.8–5.6)
Mean Plasma Glucose: 171.42 mg/dL

## 2019-04-24 LAB — MAGNESIUM: Magnesium: 1.6 mg/dL — ABNORMAL LOW (ref 1.7–2.4)

## 2019-04-24 SURGERY — APPLICATION, WOUND VAC
Anesthesia: Choice | Laterality: Left

## 2019-04-25 LAB — URINE CULTURE: Culture: NO GROWTH

## 2019-04-25 LAB — MAGNESIUM: Magnesium: 1.8 mg/dL (ref 1.7–2.4)

## 2019-04-29 ENCOUNTER — Ambulatory Visit: Payer: Medicare HMO | Admitting: Nurse Practitioner

## 2019-04-29 LAB — BASIC METABOLIC PANEL
Anion gap: 11 (ref 5–15)
BUN: 20 mg/dL (ref 8–23)
CO2: 29 mmol/L (ref 22–32)
Calcium: 8.9 mg/dL (ref 8.9–10.3)
Chloride: 98 mmol/L (ref 98–111)
Creatinine, Ser: 0.77 mg/dL (ref 0.61–1.24)
GFR calc Af Amer: >60 mL/min (ref >60)
GFR calc non Af Amer: >60 mL/min (ref >60)
Glucose, Bld: 188 mg/dL — ABNORMAL HIGH (ref 70–99)
Potassium: 3.1 mmol/L — ABNORMAL LOW (ref 3.5–5.1)
Sodium: 138 mmol/L (ref 135–145)

## 2019-04-29 LAB — CBC
HCT: 26.3 % — ABNORMAL LOW (ref 39.0–52.0)
Hemoglobin: 8.3 g/dL — ABNORMAL LOW (ref 13.0–17.0)
MCH: 24.2 pg — ABNORMAL LOW (ref 26.0–34.0)
MCHC: 31.6 g/dL (ref 30.0–36.0)
MCV: 76.7 fL — ABNORMAL LOW (ref 80.0–100.0)
Platelets: 260 10*3/uL (ref 150–400)
RBC: 3.43 MIL/uL — ABNORMAL LOW (ref 4.22–5.81)
RDW: 17.1 % — ABNORMAL HIGH (ref 11.5–15.5)
WBC: 4.1 10*3/uL (ref 4.0–10.5)
nRBC: 0 % (ref 0.0–0.2)

## 2019-04-29 LAB — MAGNESIUM: Magnesium: 1.8 mg/dL (ref 1.7–2.4)

## 2019-04-29 LAB — PHOSPHORUS: Phosphorus: 4.4 mg/dL (ref 2.5–4.6)

## 2019-04-30 LAB — POTASSIUM: Potassium: 3.7 mmol/L (ref 3.5–5.1)

## 2019-05-02 ENCOUNTER — Ambulatory Visit: Payer: Medicare HMO | Admitting: Nurse Practitioner

## 2019-05-07 ENCOUNTER — Encounter: Payer: Self-pay | Admitting: *Deleted

## 2019-05-07 LAB — RENAL FUNCTION PANEL
Albumin: 2.7 g/dL — ABNORMAL LOW (ref 3.5–5.0)
Anion gap: 10 (ref 5–15)
BUN: 40 mg/dL — ABNORMAL HIGH (ref 8–23)
CO2: 27 mmol/L (ref 22–32)
Calcium: 8.9 mg/dL (ref 8.9–10.3)
Chloride: 96 mmol/L — ABNORMAL LOW (ref 98–111)
Creatinine, Ser: 0.86 mg/dL (ref 0.61–1.24)
GFR calc Af Amer: >60 mL/min (ref >60)
GFR calc non Af Amer: >60 mL/min (ref >60)
Glucose, Bld: 176 mg/dL — ABNORMAL HIGH (ref 70–99)
Phosphorus: 4.1 mg/dL (ref 2.5–4.6)
Potassium: 3.6 mmol/L (ref 3.5–5.1)
Sodium: 133 mmol/L — ABNORMAL LOW (ref 135–145)

## 2019-05-07 LAB — CBC
HCT: 26.3 % — ABNORMAL LOW (ref 39.0–52.0)
Hemoglobin: 8.4 g/dL — ABNORMAL LOW (ref 13.0–17.0)
MCH: 24.8 pg — ABNORMAL LOW (ref 26.0–34.0)
MCHC: 31.9 g/dL (ref 30.0–36.0)
MCV: 77.6 fL — ABNORMAL LOW (ref 80.0–100.0)
Platelets: 223 10*3/uL (ref 150–400)
RBC: 3.39 MIL/uL — ABNORMAL LOW (ref 4.22–5.81)
RDW: 18.3 % — ABNORMAL HIGH (ref 11.5–15.5)
WBC: 4.4 10*3/uL (ref 4.0–10.5)
nRBC: 0 % (ref 0.0–0.2)

## 2019-05-07 LAB — MAGNESIUM: Magnesium: 1.8 mg/dL (ref 1.7–2.4)

## 2019-05-08 LAB — SARS CORONAVIRUS 2 (TAT 6-24 HRS): SARS Coronavirus 2: NEGATIVE

## 2019-05-09 DIAGNOSIS — Z743 Need for continuous supervision: Secondary | ICD-10-CM | POA: Diagnosis not present

## 2019-05-09 DIAGNOSIS — Z794 Long term (current) use of insulin: Secondary | ICD-10-CM | POA: Diagnosis not present

## 2019-05-09 DIAGNOSIS — Z87891 Personal history of nicotine dependence: Secondary | ICD-10-CM | POA: Diagnosis not present

## 2019-05-09 DIAGNOSIS — M545 Low back pain: Secondary | ICD-10-CM | POA: Diagnosis not present

## 2019-05-09 DIAGNOSIS — F419 Anxiety disorder, unspecified: Secondary | ICD-10-CM | POA: Diagnosis not present

## 2019-05-09 DIAGNOSIS — I5032 Chronic diastolic (congestive) heart failure: Secondary | ICD-10-CM | POA: Diagnosis not present

## 2019-05-09 DIAGNOSIS — M86172 Other acute osteomyelitis, left ankle and foot: Secondary | ICD-10-CM | POA: Diagnosis not present

## 2019-05-09 DIAGNOSIS — M6281 Muscle weakness (generalized): Secondary | ICD-10-CM | POA: Diagnosis not present

## 2019-05-09 DIAGNOSIS — G894 Chronic pain syndrome: Secondary | ICD-10-CM | POA: Diagnosis not present

## 2019-05-09 DIAGNOSIS — J1282 Pneumonia due to coronavirus disease 2019: Secondary | ICD-10-CM | POA: Diagnosis not present

## 2019-05-09 DIAGNOSIS — R0902 Hypoxemia: Secondary | ICD-10-CM | POA: Diagnosis not present

## 2019-05-09 DIAGNOSIS — Z7982 Long term (current) use of aspirin: Secondary | ICD-10-CM | POA: Diagnosis not present

## 2019-05-09 DIAGNOSIS — R5381 Other malaise: Secondary | ICD-10-CM | POA: Diagnosis not present

## 2019-05-09 DIAGNOSIS — E669 Obesity, unspecified: Secondary | ICD-10-CM | POA: Diagnosis not present

## 2019-05-09 DIAGNOSIS — I69354 Hemiplegia and hemiparesis following cerebral infarction affecting left non-dominant side: Secondary | ICD-10-CM | POA: Diagnosis not present

## 2019-05-09 DIAGNOSIS — N179 Acute kidney failure, unspecified: Secondary | ICD-10-CM | POA: Diagnosis not present

## 2019-05-09 DIAGNOSIS — Z955 Presence of coronary angioplasty implant and graft: Secondary | ICD-10-CM | POA: Diagnosis not present

## 2019-05-09 DIAGNOSIS — E119 Type 2 diabetes mellitus without complications: Secondary | ICD-10-CM | POA: Diagnosis not present

## 2019-05-09 DIAGNOSIS — Z89422 Acquired absence of other left toe(s): Secondary | ICD-10-CM | POA: Diagnosis not present

## 2019-05-09 DIAGNOSIS — I509 Heart failure, unspecified: Secondary | ICD-10-CM | POA: Diagnosis not present

## 2019-05-09 DIAGNOSIS — J9601 Acute respiratory failure with hypoxia: Secondary | ICD-10-CM | POA: Diagnosis not present

## 2019-05-09 DIAGNOSIS — F1120 Opioid dependence, uncomplicated: Secondary | ICD-10-CM | POA: Diagnosis not present

## 2019-05-09 DIAGNOSIS — E440 Moderate protein-calorie malnutrition: Secondary | ICD-10-CM | POA: Diagnosis not present

## 2019-05-09 DIAGNOSIS — I110 Hypertensive heart disease with heart failure: Secondary | ICD-10-CM | POA: Diagnosis not present

## 2019-05-09 DIAGNOSIS — R410 Disorientation, unspecified: Secondary | ICD-10-CM | POA: Diagnosis not present

## 2019-05-09 DIAGNOSIS — Z85828 Personal history of other malignant neoplasm of skin: Secondary | ICD-10-CM | POA: Diagnosis not present

## 2019-05-09 DIAGNOSIS — R269 Unspecified abnormalities of gait and mobility: Secondary | ICD-10-CM | POA: Diagnosis not present

## 2019-05-09 DIAGNOSIS — Z4789 Encounter for other orthopedic aftercare: Secondary | ICD-10-CM | POA: Diagnosis not present

## 2019-05-09 DIAGNOSIS — K219 Gastro-esophageal reflux disease without esophagitis: Secondary | ICD-10-CM | POA: Diagnosis present

## 2019-05-09 DIAGNOSIS — I10 Essential (primary) hypertension: Secondary | ICD-10-CM | POA: Diagnosis not present

## 2019-05-09 DIAGNOSIS — E1151 Type 2 diabetes mellitus with diabetic peripheral angiopathy without gangrene: Secondary | ICD-10-CM | POA: Diagnosis present

## 2019-05-09 DIAGNOSIS — D649 Anemia, unspecified: Secondary | ICD-10-CM | POA: Diagnosis not present

## 2019-05-09 DIAGNOSIS — R2689 Other abnormalities of gait and mobility: Secondary | ICD-10-CM | POA: Diagnosis not present

## 2019-05-09 DIAGNOSIS — R0602 Shortness of breath: Secondary | ICD-10-CM | POA: Diagnosis not present

## 2019-05-09 DIAGNOSIS — U071 COVID-19: Secondary | ICD-10-CM | POA: Diagnosis not present

## 2019-05-09 DIAGNOSIS — E1169 Type 2 diabetes mellitus with other specified complication: Secondary | ICD-10-CM | POA: Diagnosis not present

## 2019-05-09 DIAGNOSIS — M869 Osteomyelitis, unspecified: Secondary | ICD-10-CM | POA: Diagnosis not present

## 2019-05-09 DIAGNOSIS — R000 Tachycardia, unspecified: Secondary | ICD-10-CM | POA: Diagnosis not present

## 2019-05-09 DIAGNOSIS — Z472 Encounter for removal of internal fixation device: Secondary | ICD-10-CM | POA: Diagnosis not present

## 2019-05-09 DIAGNOSIS — R2681 Unsteadiness on feet: Secondary | ICD-10-CM | POA: Diagnosis not present

## 2019-05-09 DIAGNOSIS — R52 Pain, unspecified: Secondary | ICD-10-CM | POA: Diagnosis not present

## 2019-05-09 DIAGNOSIS — R279 Unspecified lack of coordination: Secondary | ICD-10-CM | POA: Diagnosis not present

## 2019-05-09 DIAGNOSIS — T8459XA Infection and inflammatory reaction due to other internal joint prosthesis, initial encounter: Secondary | ICD-10-CM | POA: Diagnosis not present

## 2019-05-09 DIAGNOSIS — R262 Difficulty in walking, not elsewhere classified: Secondary | ICD-10-CM | POA: Diagnosis not present

## 2019-05-09 DIAGNOSIS — N400 Enlarged prostate without lower urinary tract symptoms: Secondary | ICD-10-CM | POA: Diagnosis present

## 2019-05-09 DIAGNOSIS — Z79899 Other long term (current) drug therapy: Secondary | ICD-10-CM | POA: Diagnosis not present

## 2019-05-09 DIAGNOSIS — R404 Transient alteration of awareness: Secondary | ICD-10-CM | POA: Diagnosis not present

## 2019-05-09 DIAGNOSIS — S91002S Unspecified open wound, left ankle, sequela: Secondary | ICD-10-CM | POA: Diagnosis not present

## 2019-05-09 DIAGNOSIS — Z6839 Body mass index (BMI) 39.0-39.9, adult: Secondary | ICD-10-CM | POA: Diagnosis not present

## 2019-05-09 DIAGNOSIS — G4733 Obstructive sleep apnea (adult) (pediatric): Secondary | ICD-10-CM | POA: Diagnosis present

## 2019-05-09 DIAGNOSIS — E785 Hyperlipidemia, unspecified: Secondary | ICD-10-CM | POA: Diagnosis not present

## 2019-05-09 DIAGNOSIS — I2510 Atherosclerotic heart disease of native coronary artery without angina pectoris: Secondary | ICD-10-CM | POA: Diagnosis not present

## 2019-05-09 DIAGNOSIS — Z950 Presence of cardiac pacemaker: Secondary | ICD-10-CM | POA: Diagnosis not present

## 2019-05-09 DIAGNOSIS — Z7902 Long term (current) use of antithrombotics/antiplatelets: Secondary | ICD-10-CM | POA: Diagnosis not present

## 2019-05-13 DIAGNOSIS — R2689 Other abnormalities of gait and mobility: Secondary | ICD-10-CM | POA: Diagnosis not present

## 2019-05-13 DIAGNOSIS — R2681 Unsteadiness on feet: Secondary | ICD-10-CM | POA: Diagnosis not present

## 2019-05-13 DIAGNOSIS — M86172 Other acute osteomyelitis, left ankle and foot: Secondary | ICD-10-CM | POA: Diagnosis not present

## 2019-05-13 DIAGNOSIS — R262 Difficulty in walking, not elsewhere classified: Secondary | ICD-10-CM | POA: Diagnosis not present

## 2019-05-16 ENCOUNTER — Encounter: Payer: Self-pay | Admitting: *Deleted

## 2019-05-27 ENCOUNTER — Other Ambulatory Visit: Payer: Self-pay

## 2019-05-27 ENCOUNTER — Inpatient Hospital Stay
Admission: EM | Admit: 2019-05-27 | Discharge: 2019-05-29 | DRG: 177 | Disposition: A | Discharge status: Other Acute Inpatient Hospital | Payer: Medicare HMO | Source: Skilled Nursing Facility | Attending: Internal Medicine | Admitting: Internal Medicine

## 2019-05-27 ENCOUNTER — Emergency Department: Payer: Medicare HMO

## 2019-05-27 DIAGNOSIS — Z89422 Acquired absence of other left toe(s): Secondary | ICD-10-CM | POA: Diagnosis not present

## 2019-05-27 DIAGNOSIS — E1151 Type 2 diabetes mellitus with diabetic peripheral angiopathy without gangrene: Secondary | ICD-10-CM | POA: Diagnosis present

## 2019-05-27 DIAGNOSIS — J1282 Pneumonia due to coronavirus disease 2019: Secondary | ICD-10-CM | POA: Diagnosis present

## 2019-05-27 DIAGNOSIS — J9601 Acute respiratory failure with hypoxia: Secondary | ICD-10-CM | POA: Diagnosis not present

## 2019-05-27 DIAGNOSIS — Z794 Long term (current) use of insulin: Secondary | ICD-10-CM

## 2019-05-27 DIAGNOSIS — Z6839 Body mass index (BMI) 39.0-39.9, adult: Secondary | ICD-10-CM | POA: Diagnosis not present

## 2019-05-27 DIAGNOSIS — E1169 Type 2 diabetes mellitus with other specified complication: Secondary | ICD-10-CM | POA: Diagnosis not present

## 2019-05-27 DIAGNOSIS — Z7902 Long term (current) use of antithrombotics/antiplatelets: Secondary | ICD-10-CM

## 2019-05-27 DIAGNOSIS — I2510 Atherosclerotic heart disease of native coronary artery without angina pectoris: Secondary | ICD-10-CM | POA: Diagnosis not present

## 2019-05-27 DIAGNOSIS — I10 Essential (primary) hypertension: Secondary | ICD-10-CM | POA: Diagnosis not present

## 2019-05-27 DIAGNOSIS — R410 Disorientation, unspecified: Secondary | ICD-10-CM | POA: Diagnosis not present

## 2019-05-27 DIAGNOSIS — G4733 Obstructive sleep apnea (adult) (pediatric): Secondary | ICD-10-CM | POA: Diagnosis present

## 2019-05-27 DIAGNOSIS — I69354 Hemiplegia and hemiparesis following cerebral infarction affecting left non-dominant side: Secondary | ICD-10-CM | POA: Diagnosis not present

## 2019-05-27 DIAGNOSIS — U071 COVID-19: Principal | ICD-10-CM | POA: Diagnosis present

## 2019-05-27 DIAGNOSIS — E860 Dehydration: Secondary | ICD-10-CM | POA: Diagnosis present

## 2019-05-27 DIAGNOSIS — Z7982 Long term (current) use of aspirin: Secondary | ICD-10-CM

## 2019-05-27 DIAGNOSIS — R5381 Other malaise: Secondary | ICD-10-CM | POA: Diagnosis not present

## 2019-05-27 DIAGNOSIS — I110 Hypertensive heart disease with heart failure: Secondary | ICD-10-CM | POA: Diagnosis present

## 2019-05-27 DIAGNOSIS — R0602 Shortness of breath: Secondary | ICD-10-CM | POA: Diagnosis not present

## 2019-05-27 DIAGNOSIS — Z79899 Other long term (current) drug therapy: Secondary | ICD-10-CM

## 2019-05-27 DIAGNOSIS — K219 Gastro-esophageal reflux disease without esophagitis: Secondary | ICD-10-CM | POA: Diagnosis present

## 2019-05-27 DIAGNOSIS — R0902 Hypoxemia: Secondary | ICD-10-CM | POA: Diagnosis not present

## 2019-05-27 DIAGNOSIS — N179 Acute kidney failure, unspecified: Secondary | ICD-10-CM

## 2019-05-27 DIAGNOSIS — Z85828 Personal history of other malignant neoplasm of skin: Secondary | ICD-10-CM | POA: Diagnosis not present

## 2019-05-27 DIAGNOSIS — Z955 Presence of coronary angioplasty implant and graft: Secondary | ICD-10-CM | POA: Diagnosis not present

## 2019-05-27 DIAGNOSIS — Z87891 Personal history of nicotine dependence: Secondary | ICD-10-CM | POA: Diagnosis not present

## 2019-05-27 DIAGNOSIS — N400 Enlarged prostate without lower urinary tract symptoms: Secondary | ICD-10-CM | POA: Diagnosis present

## 2019-05-27 DIAGNOSIS — R404 Transient alteration of awareness: Secondary | ICD-10-CM | POA: Diagnosis not present

## 2019-05-27 DIAGNOSIS — M869 Osteomyelitis, unspecified: Secondary | ICD-10-CM | POA: Diagnosis not present

## 2019-05-27 DIAGNOSIS — I5032 Chronic diastolic (congestive) heart failure: Secondary | ICD-10-CM | POA: Diagnosis present

## 2019-05-27 DIAGNOSIS — R000 Tachycardia, unspecified: Secondary | ICD-10-CM | POA: Diagnosis not present

## 2019-05-27 LAB — CBC WITH DIFFERENTIAL/PLATELET
Abs Immature Granulocytes: 0.12 10*3/uL — ABNORMAL HIGH (ref 0.00–0.07)
Basophils Absolute: 0 10*3/uL (ref 0.0–0.1)
Basophils Relative: 1 %
Eosinophils Absolute: 0.1 10*3/uL (ref 0.0–0.5)
Eosinophils Relative: 2 %
HCT: 33.8 % — ABNORMAL LOW (ref 39.0–52.0)
Hemoglobin: 10.5 g/dL — ABNORMAL LOW (ref 13.0–17.0)
Immature Granulocytes: 2 %
Lymphocytes Relative: 14 %
Lymphs Abs: 1 10*3/uL (ref 0.7–4.0)
MCH: 24 pg — ABNORMAL LOW (ref 26.0–34.0)
MCHC: 31.1 g/dL (ref 30.0–36.0)
MCV: 77.2 fL — ABNORMAL LOW (ref 80.0–100.0)
Monocytes Absolute: 0.3 10*3/uL (ref 0.1–1.0)
Monocytes Relative: 5 %
Neutro Abs: 5.6 10*3/uL (ref 1.7–7.7)
Neutrophils Relative %: 76 %
Platelets: 281 10*3/uL (ref 150–400)
RBC: 4.38 MIL/uL (ref 4.22–5.81)
RDW: 20.9 % — ABNORMAL HIGH (ref 11.5–15.5)
WBC: 7.1 10*3/uL (ref 4.0–10.5)
nRBC: 0 % (ref 0.0–0.2)

## 2019-05-27 LAB — COMPREHENSIVE METABOLIC PANEL
ALT: 16 U/L (ref 0–44)
AST: 55 U/L — ABNORMAL HIGH (ref 15–41)
Albumin: 2.7 g/dL — ABNORMAL LOW (ref 3.5–5.0)
Alkaline Phosphatase: 97 U/L (ref 38–126)
Anion gap: 15 (ref 5–15)
BUN: 93 mg/dL — ABNORMAL HIGH (ref 8–23)
CO2: 23 mmol/L (ref 22–32)
Calcium: 8.7 mg/dL — ABNORMAL LOW (ref 8.9–10.3)
Chloride: 104 mmol/L (ref 98–111)
Creatinine, Ser: 2.81 mg/dL — ABNORMAL HIGH (ref 0.61–1.24)
GFR calc Af Amer: 26 mL/min — ABNORMAL LOW (ref >60)
GFR calc non Af Amer: 22 mL/min — ABNORMAL LOW (ref >60)
Glucose, Bld: 160 mg/dL — ABNORMAL HIGH (ref 70–99)
Potassium: 3.8 mmol/L (ref 3.5–5.1)
Sodium: 142 mmol/L (ref 135–145)
Total Bilirubin: 0.8 mg/dL (ref 0.3–1.2)
Total Protein: 7.1 g/dL (ref 6.5–8.1)

## 2019-05-27 LAB — TRIGLYCERIDES: Triglycerides: 187 mg/dL — ABNORMAL HIGH (ref <150)

## 2019-05-27 LAB — FERRITIN: Ferritin: 47 ng/mL (ref 24–336)

## 2019-05-27 LAB — LIPASE, BLOOD: Lipase: 22 U/L (ref 11–51)

## 2019-05-27 LAB — APTT: aPTT: 38 seconds — ABNORMAL HIGH (ref 24–36)

## 2019-05-27 LAB — GLUCOSE, CAPILLARY: Glucose-Capillary: 218 mg/dL — ABNORMAL HIGH (ref 70–99)

## 2019-05-27 LAB — POC SARS CORONAVIRUS 2 AG: SARS Coronavirus 2 Ag: POSITIVE — AB

## 2019-05-27 LAB — FIBRINOGEN: Fibrinogen: 750 mg/dL — ABNORMAL HIGH (ref 210–475)

## 2019-05-27 LAB — LACTATE DEHYDROGENASE: LDH: 223 U/L — ABNORMAL HIGH (ref 98–192)

## 2019-05-27 LAB — PROTIME-INR
INR: 1.1 (ref 0.8–1.2)
Prothrombin Time: 13.7 seconds (ref 11.4–15.2)

## 2019-05-27 LAB — PROCALCITONIN: Procalcitonin: 0.14 ng/mL

## 2019-05-27 LAB — LACTIC ACID, PLASMA: Lactic Acid, Venous: 1.9 mmol/L (ref 0.5–1.9)

## 2019-05-27 MED ORDER — ACETAMINOPHEN 325 MG PO TABS: 650.0000 mg | ORAL_TABLET | Freq: Four times a day (QID) | ORAL | Status: DC | PRN

## 2019-05-27 MED ORDER — ASPIRIN EC 81 MG PO TBEC
81.0000 mg | DELAYED_RELEASE_TABLET | Freq: Every day | ORAL | Status: DC
  Filled 2019-05-27: qty 1

## 2019-05-27 MED ORDER — TAMSULOSIN HCL 0.4 MG PO CAPS
0.4000 mg | ORAL_CAPSULE | Freq: Every day | ORAL | Status: DC
  Administered 2019-05-27: 0.4 mg via ORAL
  Filled 2019-05-27: qty 1

## 2019-05-27 MED ORDER — POLYETHYLENE GLYCOL 3350 17 G PO PACK: 17.0000 g | PACK | Freq: Every day | ORAL | Status: DC | PRN

## 2019-05-27 MED ORDER — DEXAMETHASONE SODIUM PHOSPHATE 10 MG/ML IJ SOLN
10.0000 mg | Freq: Once | INTRAMUSCULAR | Status: AC
  Administered 2019-05-27: 16:00:00 10 mg via INTRAVENOUS
  Filled 2019-05-27: qty 1

## 2019-05-27 MED ORDER — ONDANSETRON HCL 4 MG/2ML IJ SOLN: 4.0000 mg | Freq: Four times a day (QID) | INTRAMUSCULAR | Status: DC | PRN

## 2019-05-27 MED ORDER — EZETIMIBE 10 MG PO TABS
10.0000 mg | ORAL_TABLET | Freq: Every day | ORAL | Status: DC
  Administered 2019-05-27: 10 mg via ORAL
  Filled 2019-05-27 (×2): qty 1

## 2019-05-27 MED ORDER — OXYCODONE HCL 5 MG PO TABS: 5.0000 mg | ORAL_TABLET | ORAL | Status: DC | PRN

## 2019-05-27 MED ORDER — LEVOFLOXACIN IN D5W 750 MG/150ML IV SOLN
750.0000 mg | Freq: Once | INTRAVENOUS | Status: DC
  Filled 2019-05-27: qty 150

## 2019-05-27 MED ORDER — DEXAMETHASONE SODIUM PHOSPHATE 10 MG/ML IJ SOLN
6.0000 mg | INTRAMUSCULAR | Status: DC
  Administered 2019-05-28: 6 mg via INTRAVENOUS
  Filled 2019-05-27: qty 1

## 2019-05-27 MED ORDER — INSULIN NPH (HUMAN) (ISOPHANE) 100 UNIT/ML ~~LOC~~ SUSP: 45.0000 [IU] | Freq: Two times a day (BID) | SUBCUTANEOUS | Status: DC

## 2019-05-27 MED ORDER — PREGABALIN 50 MG PO CAPS
200.0000 mg | ORAL_CAPSULE | Freq: Every day | ORAL | Status: DC
  Administered 2019-05-27: 200 mg via ORAL
  Filled 2019-05-27: qty 4

## 2019-05-27 MED ORDER — GEMFIBROZIL 600 MG PO TABS
600.0000 mg | ORAL_TABLET | Freq: Two times a day (BID) | ORAL | Status: DC
  Filled 2019-05-27 (×3): qty 1

## 2019-05-27 MED ORDER — DULOXETINE HCL 30 MG PO CPEP
30.0000 mg | ORAL_CAPSULE | Freq: Every evening | ORAL | Status: DC
  Administered 2019-05-27: 30 mg via ORAL
  Filled 2019-05-27 (×2): qty 1

## 2019-05-27 MED ORDER — AMLODIPINE BESYLATE 5 MG PO TABS
10.0000 mg | ORAL_TABLET | Freq: Every day | ORAL | Status: DC
  Filled 2019-05-27: qty 2

## 2019-05-27 MED ORDER — OMEGA-3-ACID ETHYL ESTERS 1 G PO CAPS
1.0000 g | ORAL_CAPSULE | Freq: Two times a day (BID) | ORAL | Status: DC
  Filled 2019-05-27 (×4): qty 1

## 2019-05-27 MED ORDER — SODIUM CHLORIDE 0.9 % IV SOLN: INTRAVENOUS | Status: DC

## 2019-05-27 MED ORDER — ENOXAPARIN SODIUM 40 MG/0.4ML ~~LOC~~ SOLN: 40.0000 mg | Freq: Two times a day (BID) | SUBCUTANEOUS | Status: DC

## 2019-05-27 MED ORDER — FUROSEMIDE 40 MG PO TABS: 20.0000 mg | ORAL_TABLET | Freq: Every day | ORAL | Status: DC

## 2019-05-27 MED ORDER — LACTULOSE 10 GM/15ML PO SOLN: 20.0000 g | Freq: Every day | ORAL | Status: DC | PRN

## 2019-05-27 MED ORDER — SODIUM CHLORIDE 0.9 % IV SOLN
200.0000 mg | Freq: Once | INTRAVENOUS | Status: AC
  Administered 2019-05-27: 200 mg via INTRAVENOUS
  Filled 2019-05-27: qty 200

## 2019-05-27 MED ORDER — ONDANSETRON HCL 4 MG PO TABS: 4.0000 mg | ORAL_TABLET | Freq: Four times a day (QID) | ORAL | Status: DC | PRN

## 2019-05-27 MED ORDER — OXCARBAZEPINE 300 MG PO TABS
600.0000 mg | ORAL_TABLET | Freq: Every day | ORAL | Status: DC
  Filled 2019-05-27 (×2): qty 2

## 2019-05-27 MED ORDER — FENOFIBRATE 54 MG PO TABS: 54.0000 mg | ORAL_TABLET | Freq: Every day | ORAL | Status: DC

## 2019-05-27 MED ORDER — SODIUM CHLORIDE 0.9 % IV SOLN
2.0000 g | Freq: Once | INTRAVENOUS | Status: AC
  Administered 2019-05-27: 2 g via INTRAVENOUS
  Filled 2019-05-27: qty 2

## 2019-05-27 MED ORDER — SODIUM CHLORIDE 0.9 % IV BOLUS (SEPSIS)
1000.0000 mL | Freq: Once | INTRAVENOUS | Status: AC
  Administered 2019-05-27: 14:00:00 1000 mL via INTRAVENOUS

## 2019-05-27 MED ORDER — HEPARIN SODIUM (PORCINE) 10000 UNIT/ML IJ SOLN
7500.0000 [IU] | Freq: Three times a day (TID) | INTRAMUSCULAR | Status: DC
  Administered 2019-05-27 – 2019-05-28 (×2): 7500 [IU] via SUBCUTANEOUS
  Filled 2019-05-27 (×3): qty 1

## 2019-05-27 MED ORDER — INSULIN GLARGINE 100 UNIT/ML ~~LOC~~ SOLN
20.0000 [IU] | Freq: Two times a day (BID) | SUBCUTANEOUS | Status: DC
  Administered 2019-05-27 – 2019-05-28 (×3): 20 [IU] via SUBCUTANEOUS
  Filled 2019-05-27 (×4): qty 0.2

## 2019-05-27 MED ORDER — DULOXETINE HCL 60 MG PO CPEP
60.0000 mg | ORAL_CAPSULE | Freq: Every day | ORAL | Status: DC
  Filled 2019-05-27: qty 1

## 2019-05-27 MED ORDER — BENAZEPRIL HCL 20 MG PO TABS: 20.0000 mg | ORAL_TABLET | Freq: Two times a day (BID) | ORAL | Status: DC

## 2019-05-27 MED ORDER — PRO-STAT SUGAR FREE PO LIQD: 30.0000 mL | Freq: Every day | ORAL | Status: DC

## 2019-05-27 MED ORDER — METRONIDAZOLE IN NACL 5-0.79 MG/ML-% IV SOLN
500.0000 mg | Freq: Once | INTRAVENOUS | Status: AC
  Administered 2019-05-27: 500 mg via INTRAVENOUS
  Filled 2019-05-27: qty 100

## 2019-05-27 MED ORDER — PANTOPRAZOLE SODIUM 40 MG PO TBEC
40.0000 mg | DELAYED_RELEASE_TABLET | Freq: Every day | ORAL | Status: DC
  Filled 2019-05-27: qty 1

## 2019-05-27 MED ORDER — LINACLOTIDE 290 MCG PO CAPS
290.0000 ug | ORAL_CAPSULE | Freq: Every day | ORAL | Status: DC
  Filled 2019-05-27 (×2): qty 1

## 2019-05-27 MED ORDER — CLOPIDOGREL BISULFATE 75 MG PO TABS
75.0000 mg | ORAL_TABLET | Freq: Every day | ORAL | Status: DC
  Filled 2019-05-27: qty 1

## 2019-05-27 MED ORDER — SODIUM CHLORIDE 0.9 % IV SOLN
100.0000 mg | Freq: Every day | INTRAVENOUS | Status: DC
  Administered 2019-05-28: 100 mg via INTRAVENOUS
  Filled 2019-05-27 (×2): qty 20

## 2019-05-27 MED ORDER — POLYETHYLENE GLYCOL 3350 17 G PO PACK
17.0000 g | PACK | Freq: Two times a day (BID) | ORAL | Status: DC
  Filled 2019-05-27 (×2): qty 1

## 2019-05-27 MED ORDER — VANCOMYCIN HCL IN DEXTROSE 1-5 GM/200ML-% IV SOLN
1000.0000 mg | Freq: Once | INTRAVENOUS | Status: AC
  Administered 2019-05-27: 1000 mg via INTRAVENOUS
  Filled 2019-05-27: qty 200

## 2019-05-27 MED ORDER — LISINOPRIL 10 MG PO TABS: 20.0000 mg | ORAL_TABLET | Freq: Every day | ORAL | Status: DC

## 2019-05-27 MED ORDER — ASCORBIC ACID 500 MG PO TABS
500.0000 mg | ORAL_TABLET | Freq: Two times a day (BID) | ORAL | Status: DC
  Administered 2019-05-27: 500 mg via ORAL
  Filled 2019-05-27 (×2): qty 1

## 2019-05-27 MED ORDER — OXYCODONE HCL ER 10 MG PO T12A: 20.0000 mg | EXTENDED_RELEASE_TABLET | Freq: Two times a day (BID) | ORAL | Status: DC

## 2019-05-27 NOTE — ED Notes (Signed)
Patient refusing to take meds for this RN at this time

## 2019-05-27 NOTE — ED Notes (Addendum)
Pt resting quietly with eyes closed and even respirations. Nasal canula and surgical mask in place. Iv fluids infusing without difficulty. Provided for safety and comfort and will continue to assess.

## 2019-05-27 NOTE — ED Provider Notes (Signed)
[facility] Emergency Department Provider Note  ____________________________________________  Time seen: Approximately 2:32 PM  I have reviewed the triage vital signs and the nursing notes.   HISTORY  Chief Complaint Shortness of Breath    Level 5 Caveat: Portions of the History and Physical including HPI and review of systems are unable to be completely obtained due to patient being a poor historian   HPI Victor Wise is a 69 y.o. 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  Nursing home staff where he currently resides found him to be short of breath and reported a room air oxygen saturation of 60%.  EMS report that her initial oxygen saturation was about 75%.  They placed patient on 4 L nasal cannula with improvement of his saturation into the low 90s%.  Is remained at the SNF recently tested positive for Covid about 2 weeks ago.  This patient has had 2 negative Covid PCR's on December 28 and January 6.  He is recently being treated for osteomyelitis of the left ankle with IV cefazolin through PICC line.  Report from nursing home is that the patient has been refusing his antibiotics and oral medications recently.   Patient reports shortness of breath, denies any other acute complaints     Past Medical History:  Diagnosis Date  . Allergy   . Basal cell carcinoma    forehead  . BPH (benign prostatic hyperplasia)   . CHF (congestive heart failure) ([place])   . Chronic back pain   . Coronary artery disease   . Depression   . Diabetes ([place])   . GERD (gastroesophageal reflux disease)   . Heart attack ([place])    2018  . Hypertension   . Morbid obesity ([place])   . Obstructive sleep apnea   . Osteomyelitis of foot ([place])   . Status post insertion of spinal cord stimulator   . Stroke [facility])    last one 2-3 years ago   . UTI (lower urinary tract infection)      Patient Active Problem List   Diagnosis Date Noted  . Moderate episode of recurrent major depressive disorder ([place])   . Diabetic polyneuropathy associated with type 2 diabetes mellitus ([place])   . Pressure injury of skin 04/13/2019  . Anemia   . Hypokalemia   . Preop examination   . Pyogenic inflammation of bone ([place])   . Infected hardware in left lower extremity ([place]) 04/10/2019  . Wound dehiscence, surgical 04/10/2019  . Leg wound, left, initial encounter 03/26/2019  . Bimalleolar fracture of left ankle 01/31/2019  . Bilateral leg edema 08/20/2018  . Diabetic ulcer of left foot ([place]) 04/18/2018  . Swelling of limb 04/06/2018  . Atherosclerosis of native arteries of the extremities with ulceration ([place]) 04/06/2018  . Edema of lower extremity 04/04/2018  . Diabetic foot infection ([place]) 03/01/2018  . Spinal cord stimulator status 01/12/2018  . Essential hypertension 09/05/2017  . OSA on CPAP 09/05/2017  . Uncontrolled type 2 diabetes mellitus with hyperglycemia ([place]) 08/02/2017  . Peripheral vascular disease ([place]) 08/02/2017  . Mixed hyperlipidemia 08/02/2017  . Dysuria 08/02/2017  . Obesity, Class III, BMI 40-49.9 (morbid obesity) ([place]) 08/10/2016  . Hemiparesis affecting left side as late effect of stroke ([place]) 05/09/2016  . Symptomatic bradycardia 03/21/2016  . Falls frequently 02/26/2016  . Acute renal failure (ARF) ([place]) 02/18/2016  .  Hypotension 02/18/2016  . Sick sinus syndrome ([place]) 10/28/2015  . Syncopal episodes 10/26/2015  . Syncope 10/26/2015  . Adjustment disorder with mixed anxiety and depressed mood 10/20/2015  . Dysthymia 10/20/2015  . CVA (cerebral infarction) 10/19/2015  . Urinary retention 03/25/2015  . 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 Coronary artery disease due to lipid rich plaque 12/29/2014  . Acute MI, anterolateral wall, subsequent episode of care ([place]) 12/19/2014  . SIRS (systemic inflammatory response  syndrome) ([place]) 12/18/2014  . Systemic inflammatory response syndrome (sirs) of non-infectious origin without acute organ dysfunction ([place]) 12/18/2014  . Encounter for other preprocedural examination 11/10/2014  . LVH (left ventricular hypertrophy) due to hypertensive disease, with heart failure ([place]) 10/02/2014  . Benign essential hypertension 09/26/2014  . Chronic diastolic CHF (congestive heart failure), NYHA class 2 ([place]) 06/03/2014  . Pain syndrome, chronic 03/02/2014  . Type 2 diabetes mellitus with hyperlipidemia ([place]) 11/08/2013  . Chronic radicular low back pain 08/21/2013  . Right shoulder pain 04/03/2013  . Urinary tract infection 03/16/2012  . Balanoposthitis 02/13/2012  . History of urinary stone 02/13/2012  . Benign prostatic hyperplasia without lower urinary tract symptoms 02/13/2012  . 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 Redundant prepuce and phimosis 02/13/2012  . Urge incontinence 02/13/2012  . Right foot pain 01/18/2012  . Chronic, continuous use of opioids 08/22/2011     Past Surgical History:  Procedure Laterality Date  . AMPUTATION TOE Left 04/19/2018   Procedure: AMPUTATION TOE-LEFT GREAT TOE;  Surgeon: Sharlotte Alamo, DPM;  Location: [facility] ORS;  Service: Podiatry;  Laterality: Left;  . APPLICATION OF A-CELL OF EXTREMITY Left 04/16/2019   Procedure: APPLICATION OF A-CELL OF EXTREMITY;  Surgeon: Thornton Park, MD;  Location: [facility] OR[facility];  Service: Orthopedics;  Laterality: Left;  . APPLICATION OF WOUND VAC Left 04/16/2019   Procedure: APPLICATION OF WOUND VAC;  Surgeon: Thornton Park, MD;  Location: [facility] OR[facility];  Service: Orthopedics;  Laterality: Left;  . APPLICATION OF WOUND VAC Left 04/12/2019   Procedure: APPLICATION OF WOUND VAC;  Surgeon: Thornton Park, MD;  Location: [facility] OR[facility];  Service: Orthopedics;  Laterality: Left;  . CARDIAC CATHETERIZATION N/A 12/19/2014   Procedure: Coronary Stent Intervention;  Surgeon: Charolette Forward, MD;  Location: [place] [facility];   Service: Cardiovascular;  Laterality: N/A;  . CARDIAC CATHETERIZATION Left 12/19/2014   Procedure: Left Heart Cath and Coronary Angiography;  Surgeon: Dionisio Graves, MD;  Location: [place] [facility];  Service: Cardiovascular;  Laterality: Left;  . HARDWARE REMOVAL Left 04/12/2019   Procedure: HARDWARE REMOVAL;  Surgeon: Thornton Park, MD;  Location: [facility];  Service: Orthopedics;  Laterality: Left;  . INCISION AND DRAINAGE OF WOUND Left 04/12/2019   Procedure: IRRIGATION AND DEBRIDEMENT WOUND, WOUND VAC EXCHANGE;  Surgeon: Thornton Park, MD;  Location: [facility];  Service: Orthopedics;  Laterality: Left;  . INCISION AND DRAINAGE OF WOUND Left 04/12/2019   Procedure: IRRIGATION AND DEBRIDEMENT WOUND;  Surgeon: Thornton Park, MD;  Location: [facility];  Service: Orthopedics;  Laterality: Left;  . IRRIGATION AND DEBRIDEMENT FOOT Left 03/02/2018   Procedure: IRRIGATION AND DEBRIDEMENT FOOT;  Surgeon: Samara Deist, DPM;  Location: [facility];  Service: Podiatry;  Laterality: Left;  . IRRIGATION AND DEBRIDEMENT FOOT Left 03/08/2018   Procedure: IRRIGATION AND DEBRIDEMENT FOOT AND BONE;  Surgeon: Sharlotte Alamo, DPM;  Location: [facility];  Service: Podiatry;  Laterality: Left;  . IRRIGATION AND DEBRIDEMENT FOOT Left 04/19/2018   Procedure: IRRIGATION AND DEBRIDEMENT FOOT;  Surgeon: Sharlotte Alamo, DPM;  Location:  [facility];  Service: Podiatry;  Laterality: Left;  . LOWER EXTREMITY ANGIOGRAPHY Left 03/05/2018   Procedure: Lower Extremity Angiography;  Surgeon: Algernon Huxley, MD;  Location: [facility] [place] [facility];  Service: Cardiovascular;  Laterality: Left;  . LOWER EXTREMITY ANGIOGRAPHY Left 03/08/2018   Procedure: LOWER EXTREMITY ANGIOGRAPHY;  Surgeon: Algernon Huxley, MD;  Location: [place];  Service: Cardiovascular;  Laterality: Left;  . ORIF ANKLE FRACTURE Left 01/31/2019   Procedure: open reduction internal fixation left bimalleolar ankle fracture;  Surgeon: Thornton Park, MD;  Location:  [facility];  Service: Orthopedics;  Laterality: Left;  . PACEMAKER INSERTION Left 11/02/2015   Procedure: INSERTION PACEMAKER;  Surgeon: Isaias Cowman, MD;  Location: [facility];  Service: Cardiovascular;  Laterality: Left;  . Pain Stimulator    . Right toe amputation       Prior to Admission medications   Medication Sig Start Date End Date Taking? Authorizing Provider  acetaminophen (TYLENOL) 650 MG CR tablet Take 650 mg by mouth 2 (two) times daily.    [provider]  Amino Acids-Protein Hydrolys (FEEDING SUPPLEMENT, PRO-STAT SUGAR FREE 64,) LIQD Take 30 mLs by mouth daily.    [provider]  amLODipine (NORVASC) 10 MG tablet TAKE 1 TABLET BY MOUTH ONCE DAILY Patient taking differently: Take 10 mg by mouth daily.  12/03/18   Kendell Bane, NP  aspirin EC 81 MG tablet Take 81 mg by mouth daily.    [provider]  benazepril (LOTENSIN) 20 MG tablet TAKE 1 TABLET BY MOUTH TWICE DAILY Patient taking differently: Take 20 mg by mouth 2 (two) times daily.  02/27/19   Kendell Bane, NP  ceFAZolin (ANCEF) IVPB Inject 2 g into the vein every 8 (eight) hours. Indication: MSSA ankle infection Last Day of Therapy: 05/26/2019 Labs - Once weekly:  CBC/D, CMP,  ESR and CRP 04/22/19 05/28/19  Thornton Park, MD  clopidogrel (PLAVIX) 75 MG tablet Take 1 tablet (75 mg total) by mouth daily. 06/28/18   Ronnell Freshwater, NP  DULoxetine (CYMBALTA) 30 MG capsule Take 30 mg by mouth every evening.    [provider]  DULoxetine (CYMBALTA) 60 MG capsule Take 60 mg by mouth daily.    [provider]  enoxaparin (LOVENOX) 40 MG/0.4ML injection Inject 0.4 mLs (40 mg total) into the skin daily. 04/22/19 05/22/19  Thornton Park, MD  Exenatide ER (BYDUREON) 2 MG PEN Inject 2 mg into the skin once a week. Patient taking differently: Inject 2 mg into the skin every Sunday.  11/26/18   Kendell Bane, NP  ezetimibe (ZETIA) 10 MG tablet Take 10 mg by mouth daily.   10/29/18   [provider]  fenofibrate 54 MG tablet TAKE 1 TABLET BY MOUTH A DAY AT SUPPERTIME Patient taking differently: Take 54 mg by mouth daily at 6 PM.  12/03/18   Scarboro, Audie Clear, NP  furosemide (LASIX) 20 MG tablet Take 20 mg by mouth daily. 10/01/18   [provider]  hydrochlorothiazide (HYDRODIURIL) 12.5 MG tablet Take 1 tablet (12.5 mg total) by mouth daily. 01/31/19   Ronnell Freshwater, NP  insulin NPH Human (NOVOLIN N) 100 UNIT/ML injection Inject 45 Units into the skin 2 (two) times daily.    [provider]  insulin regular (NOVOLIN R) 100 units/mL injection Patient to use Novolin R [place] TiD prior to meals per sliding scale instructions. Max daily dose is 45 units per day. Patient taking differently: Inject 1-9 Units into  the skin 3 (three) times daily. If bs is 120-150= 1 unit, 151-200=2 units, 201-250= 3 units, 251-300 = 5 units, 301-350= 7 units, 351-400= 9 units, >400 call md 06/22/18   Kendell Bane, NP  lactulose (CHRONULAC) 10 GM/15ML solution Take 20 g by mouth See admin instructions. Take 20 g in the evening, may take an additional 20 g up to 2 times daily as needed for constipation    [provider]  lidocaine (LIDODERM) 5 % Place 1 patch onto the skin daily as needed (pain). Remove & Discard patch within 12 hours or as directed by MD    [provider]  linaclotide (LINZESS) 145 MCG CAPS capsule Take 290 mcg by mouth daily before breakfast.    [provider]  Multiple Vitamin (MULTIVITAMIN WITH MINERALS) TABS tablet Take 1 tablet by mouth daily.    [provider]  NARCAN 4 MG/0.1ML LIQD Place 4 mg into the nose as needed (for opioid overdose).     [provider]  omega-3 acid ethyl esters (LOVAZA) 1 g capsule Take 1 capsule (1 g total) by mouth 2 (two) times daily. 01/24/19   Kendell Bane, NP  oxcarbazepine (TRILEPTAL) 600 MG tablet Take 600 mg by mouth 2 (two) times daily.    [provider]   oxyCODONE (OXY IR/ROXICODONE) 5 MG immediate release tablet Take 1 tablet (5 mg total) by mouth every 4 (four) hours as needed for breakthrough pain ((for MODERATE breakthrough pain)). 04/22/19   Thornton Park, MD  oxyCODONE (OXYCONTIN) 20 mg 12 hr tablet Take 1 tablet (20 mg total) by mouth every 12 (twelve) hours. 04/22/19   Thornton Park, MD  pantoprazole (PROTONIX) 40 MG tablet TAKE 1 TABLET BY MOUTH ONCE DAILY FOR REFLUX Patient taking differently: Take 40 mg by mouth daily.  02/01/19   Scarboro, Audie Clear, NP  polyethylene glycol (MIRALAX / Floria Raveling) packet Take 17 g by mouth 2 (two) times daily.     [provider]  pregabalin (LYRICA) 200 MG capsule Take 400 mg by mouth 2 (two) times daily.    [provider]  tamsulosin (FLOMAX) 0.4 MG CAPS capsule TAKE 1 CAPSULE BY MOUTH ONCE DAILY AFTERSUPPER Patient taking differently: Take 0.4 mg by mouth daily at 6 PM.  02/01/19   Scarboro, Audie Clear, NP  vitamin C (ASCORBIC ACID) 250 MG tablet Take 500 mg by mouth 2 (two) times daily.    [provider]     Allergies Amoxicillin, Other, Penicillins, Hydralazine, Cephalexin, Clindamycin, Crestor [rosuvastatin calcium], Metoprolol, Red dye, Statins, Sulfa antibiotics, Yellow dyes (non-tartrazine), Aspirin, and Tape   Family History  Problem Relation Age of Onset  . Breast cancer Mother   . Cancer Mother   . Hypertension Mother   . Lung cancer Father   . Hypertension Father   . Heart disease Father   . Cancer Father   . Kidney disease Sister   . Prostate cancer Neg Hx     Social History Social History   Tobacco Use  . Smoking status: Former Smoker    Types: Cigarettes  . Smokeless tobacco: Never Used  . Tobacco comment: quit 45 years  Substance Use Topics  . Alcohol use: Not Currently    Alcohol/week: 0.0 standard drinks    Comment: have not had alcohol in 21yr  . Drug use: No    Review of Systems Level 5 Caveat: Portions of the History and Physical  including HPI and review of systems are unable to  be completely obtained due to patient being a poor historian   Constitutional:   No known fever.  ENT:   No rhinorrhea. Cardiovascular:   No chest pain or syncope. Respiratory:   Positive shortness of breath and cough. Gastrointestinal:   Negative for abdominal pain, vomiting and diarrhea.  Musculoskeletal:   Positive left side swelling, chronic ____________________________________________   PHYSICAL EXAM:  VITAL SIGNS: ED Triage Vitals [05/27/19 1339]  Enc Vitals Group     BP (!) 101/55     Pulse Rate 88     Resp (!) 24     Temp (!) 97.4 F (36.3 C)     Temp Source Oral     SpO2 95 %     Weight 260 lb (117.9 kg)     Height 5' 8"  (1.727 m)     Head Circumference      Peak Flow      Pain Score 0     Pain Loc      Pain Edu?      Excl. in [place]?     Vital signs reviewed, nursing assessments reviewed.   Constitutional:   Awake and alert, oriented x2.  Ill-appearing.  Morbidly obese Eyes:   Conjunctivae are normal. EOMI. PERRL. ENT      Head:   Normocephalic and atraumatic.      Nose:   No congestion/rhinnorhea.       Mouth/Throat:   Dry mucous membranes, no pharyngeal erythema. No peritonsillar mass.       Neck:   No meningismus. Full ROM. Hematological/Lymphatic/Immunilogical:   No cervical lymphadenopathy. Cardiovascular:   RRR. Symmetric bilateral radial and DP pulses.  No murmurs. Cap refill less than 2 seconds. Respiratory: Tachypnea respiratory rate of 24.  Diminished breath sounds bilateral lower lung fields, diffuse crackles. Gastrointestinal:   Soft and nontender. Non distended. There is no CVA tenderness.  No rebound, rigidity, or guarding.  Musculoskeletal:   Tenderness of left foot and ankle with palpation.  No inflammatory skin changes deformity or bony point tenderness.  No open wounds noted.  Good distal perfusion. There is edema of the left arm and left leg consistent with lymphedema from his  paralysis. Neurologic:   Normal speech and language.  Motor grossly intact on right side. No acute focal neurologic deficits are appreciated.  Skin:    Skin is warm, dry and intact. No rash noted.  No petechiae, purpura, or bullae.  ____________________________________________    LABS (pertinent positives/negatives) (all labs ordered are listed, but only abnormal results are displayed) Labs Reviewed  COMPREHENSIVE METABOLIC PANEL - Abnormal; Notable for the following components:      Result Value   Glucose, Bld 160 (*)    BUN 93 (*)    Creatinine, Ser 2.81 (*)    Calcium 8.7 (*)    Albumin 2.7 (*)    AST 55 (*)    GFR calc non Af Amer 22 (*)    GFR calc Af Amer 26 (*)    All other components within normal limits  CBC WITH DIFFERENTIAL/PLATELET - Abnormal; Notable for the following components:   Hemoglobin 10.5 (*)    HCT 33.8 (*)    MCV 77.2 (*)    MCH 24.0 (*)    RDW 20.9 (*)    Abs Immature Granulocytes 0.12 (*)    All other components within normal limits  APTT - Abnormal; Notable for the following components:   aPTT 38 (*)    All other components within normal  limits  CULTURE, BLOOD (ROUTINE X 2)  CULTURE, BLOOD (ROUTINE X 2)  URINE CULTURE  LACTIC ACID, PLASMA  LIPASE, BLOOD  PROTIME-INR  LACTIC ACID, PLASMA  PROCALCITONIN  URINALYSIS, COMPLETE (UACMP) WITH MICROSCOPIC  LACTATE DEHYDROGENASE  FERRITIN  TRIGLYCERIDES  FIBRINOGEN  POC SARS CORONAVIRUS 2 AG -  ED   ____________________________________________   EKG  Interpreted by me Sinus rhythm rate of 62, normal axis and intervals.  Poor R wave progression.  Normal ST segments and T waves.  ____________________________________________    [id number]  DG Chest Port 1 View  Result Date: 05/27/2019 CLINICAL DATA:  Shortness of breath. EXAM: PORTABLE CHEST 1 VIEW COMPARISON:  February 18, 2016. FINDINGS: Stable cardiomediastinal silhouette. No pneumothorax is noted. Right-sided PICC line is noted with tip  in expected position of the SVC. No pneumothorax is noted. Central pulmonary vascular congestion is noted. Mild ill-defined bilateral lung opacities are noted which may represent pneumonia. Small left pleural effusion is noted. Bony thorax is unremarkable. IMPRESSION: Right-sided PICC line is noted with tip in expected position of the SVC. Central pulmonary vascular congestion is noted. Mild ill-defined bilateral lung opacities are noted concerning for pneumonia. Small left pleural effusion is noted. Electronically Signed   By: Marijo Conception M.D.   On: 05/27/2019 14:06    ____________________________________________   PROCEDURES .Critical Care Performed by: Carrie Mew, MD Authorized by: Carrie Mew, MD   Critical care provider statement:    Critical care time (minutes):  35   Critical care time was exclusive of:  Separately billable procedures and treating other patients   Critical care was necessary to treat or prevent imminent or life-threatening deterioration of the following conditions:  Respiratory failure, sepsis and dehydration   Critical care was time spent personally by me on the following activities:  Development of treatment plan with patient or surrogate, discussions with consultants, evaluation of patient's response to treatment, examination of patient, obtaining history from patient or surrogate, ordering and performing treatments and interventions, ordering and review of laboratory studies, ordering and review of radiographic studies, pulse oximetry, re-evaluation of patient's condition and review of old charts    ____________________________________________  DIFFERENTIAL DIAGNOSIS   HCAP, COVID-19 pneumonia, pulmonary edema, pleural effusion, sepsis secondary to UTI/pneumonia/osteomyelitis, dehydration.  Less likely pulmonary embolism  CLINICAL IMPRESSION / ASSESSMENT AND PLAN / ED COURSE  Medications ordered in the ED: Medications  sodium chloride 0.9 %  bolus 1,000 mL (1,000 mLs Intravenous New Bag/Given 05/27/19 1359)  vancomycin (VANCOCIN) IVPB 1000 mg/200 mL premix (1,000 mg Intravenous New Bag/Given 05/27/19 1358)  ceFEPIme (MAXIPIME) 2 g in sodium chloride 0.9 % 100 mL IVPB (2 g Intravenous New Bag/Given 05/27/19 1404)  metroNIDAZOLE (FLAGYL) IVPB 500 mg (has no administration in time range)  dexamethasone (DECADRON) injection 10 mg (has no administration in time range)    Pertinent labs & imaging results that were available during my care of the patient were reviewed by me and considered in my medical decision making (see chart for details).   Victor Wise was evaluated in Emergency Department on 05/27/2019 for the symptoms described in the history of present illness. He was evaluated in the context of the global COVID-19 pandemic, which necessitated consideration that the patient might be at risk for infection with the SARS-CoV-2 virus that causes COVID-19. Institutional protocols and algorithms that pertain to the evaluation of patients at risk for COVID-19 are in a state of rapid change based on information released by regulatory bodies including the CDC  and federal and state organizations. These policies and algorithms were followed during the patient's care in the ED.   Patient presents with tachypnea, hypoxia to 75%.  Highly suspect Covid.  Code sepsis initiated on initial assessment due to his recent treatment of osteomyelitis, SNF placement, concern for HCAP.  Antibiotic coverage with Levaquin and vancomycin for now, continue nasal cannula oxygen as needed.  He appears significantly dehydrated without obvious signs of shock so given 1 L IV fluid bolus  Clinical Course as of May 26 1430  Mon May 27, 2019  1428 [place] POC is positive   [PS]    Clinical Course User Index [PS] Carrie Mew, MD     ----------------------------------------- 2:44 PM on 05/27/2019 -----------------------------------------  Chest x-ray shows diffuse  infiltrates consistent with Covid pneumonia.  Decadron and pharmacy consult for remdesivir ordered.  Also has AKI, can continue IV fluids for hydration as needed.  Case discussed with hospitalist for admission.  ____________________________________________   FINAL CLINICAL IMPRESSION(S) / ED DIAGNOSES    Final diagnoses:  Acute respiratory failure with hypoxia ([place])  Pneumonia due to COVID-19 virus  AKI (acute kidney injury) [facility])     ED Discharge Orders    None      Portions of this note were generated with dragon dictation software. Dictation errors may occur despite best attempts at proofreading.   Carrie Mew, MD 05/27/19 1445

## 2019-05-27 NOTE — Progress Notes (Signed)
CODE SEPSIS - PHARMACY COMMUNICATION  **Broad Spectrum Antibiotics should be administered within 1 hour of Sepsis diagnosis**  Time Code Sepsis Called/Page Received: 1340  Antibiotics Ordered: vanc/cefepime/Flagyl  Time of 1st antibiotic administration: 1358 (vanc given first)  Additional action taken by pharmacy: NA  If necessary, Name of Provider/Nurse Contacted: NA    Victor Wise ,PharmD Clinical Pharmacist  05/27/2019  1:59 PM

## 2019-05-27 NOTE — ED Notes (Signed)
Only able to obtain 1 set of blood cultures.  

## 2019-05-27 NOTE — H&P (Addendum)
History and Physical:    Victor Wise   FHL:[id number] DOB: February 26, 1951 DOA: 05/27/2019  Referring MD/provider: Brenton Grills, MD PCP: Lavera Guise, MD   Patient coming from: Nursing home  Chief Complaint: Low oxygen saturation  History of Present Illness:   Victor Wise is an 69 y.o. male with multiple medical problems including CHF, BPH, history of basal cell cancer on the forehead, chronic back pain, CAD, depression, morbid obesity, hypertension, type 2 diabetes mellitus, GERD, obstructive sleep apnea, osteomyelitis of the foot, UTI, status post insertion of spinal cord stimulator.  He was recently discharged from the hospital on 04/23/2019 after hospitalization for infected hardware left lower extremity status post hardware removal (prescribed IV Ancef for discharge to be completed on May 26, 2019).  He was brought to the hospital because of low oxygen saturation of 60% and shortness of breath.  According to ED physician, oxygen saturation by EMS was 75% and was subsequently placed on 4 L oxygen via nasal cannula.  Apparently, he had negative Covid test at the nursing home on January 6.  Patient is unable to provide an adequate history because he is confused.  He denies any fever or shortness of breath and does not report any symptom.  He answers no to all my questions.  I called his daughter, Ms. Victor Wise, at [phone].  His daughter said she was informed by the nursing home staff that patient had been refusing treatment for the past 7 to 10 days.  She also said patient had refused to speak to her and had reported to the nursing staff that he no longer wanted to live hence his decision to discontinue treatment.  His daughter, Victor Wise who is the healthcare power of attorney,  said that it has become increasingly difficult to communicate with her dad and that he did not even call her on her birthday.     ED Course:  The patient had unremarkable vital signs except for  hypoxemia.  Covid test was positive and chest x-ray showed bilateral infiltrates.  Hospitalist team was consulted to admit for further evaluation.  Ferritin was 47, LDH 223, procalcitonin 0.14, lactic acid 1.9.  He was given IV dexamethasone, IV cefepime, IV Flagyl, 1L of normal saline and IV remdesivir in the emergency room.  ROS:   ROS unable to obtain because he is confused.  Past Medical History:   Past Medical History:  Diagnosis Date  . Allergy   . Basal cell carcinoma    forehead  . BPH (benign prostatic hyperplasia)   . CHF (congestive heart failure) ([place])   . Chronic back pain   . Coronary artery disease   . Depression   . Diabetes ([place])   . GERD (gastroesophageal reflux disease)   . Heart attack ([place])    2018  . Hypertension   . Morbid obesity ([place])   . Obstructive sleep apnea   . Osteomyelitis of foot ([place])   . Status post insertion of spinal cord stimulator   . Stroke [facility])    last one 2-3 years ago  . UTI (lower urinary tract infection)     Past Surgical History:   Past Surgical History:  Procedure Laterality Date  . AMPUTATION TOE Left 04/19/2018   Procedure: AMPUTATION TOE-LEFT GREAT TOE;  Surgeon: Sharlotte Alamo, DPM;  Location: [facility] OR[facility];  Service: Podiatry;  Laterality: Left;  . APPLICATION OF A-CELL OF EXTREMITY Left 04/16/2019   Procedure: APPLICATION OF A-CELL OF EXTREMITY;  Surgeon: Thornton Park,  MD;  Location: [facility] OR[facility];  Service: Orthopedics;  Laterality: Left;  . APPLICATION OF WOUND VAC Left 04/16/2019   Procedure: APPLICATION OF WOUND VAC;  Surgeon: Thornton Park, MD;  Location: [facility] OR[facility];  Service: Orthopedics;  Laterality: Left;  . APPLICATION OF WOUND VAC Left 04/12/2019   Procedure: APPLICATION OF WOUND VAC;  Surgeon: Thornton Park, MD;  Location: [facility];  Service: Orthopedics;  Laterality: Left;  . CARDIAC CATHETERIZATION N/A 12/19/2014   Procedure: Coronary Stent Intervention;  Surgeon: Charolette Forward, MD;  Location: [place] [facility];   Service: Cardiovascular;  Laterality: N/A;  . CARDIAC CATHETERIZATION Left 12/19/2014   Procedure: Left Heart Cath and Coronary Angiography;  Surgeon: Dionisio Averi, MD;  Location: [place] [facility];  Service: Cardiovascular;  Laterality: Left;  . HARDWARE REMOVAL Left 04/12/2019   Procedure: HARDWARE REMOVAL;  Surgeon: Thornton Park, MD;  Location: [facility];  Service: Orthopedics;  Laterality: Left;  . INCISION AND DRAINAGE OF WOUND Left 04/12/2019   Procedure: IRRIGATION AND DEBRIDEMENT WOUND, WOUND VAC EXCHANGE;  Surgeon: Thornton Park, MD;  Location: [facility];  Service: Orthopedics;  Laterality: Left;  . INCISION AND DRAINAGE OF WOUND Left 04/12/2019   Procedure: IRRIGATION AND DEBRIDEMENT WOUND;  Surgeon: Thornton Park, MD;  Location: [facility];  Service: Orthopedics;  Laterality: Left;  . IRRIGATION AND DEBRIDEMENT FOOT Left 03/02/2018   Procedure: IRRIGATION AND DEBRIDEMENT FOOT;  Surgeon: Samara Deist, DPM;  Location: [facility];  Service: Podiatry;  Laterality: Left;  . IRRIGATION AND DEBRIDEMENT FOOT Left 03/08/2018   Procedure: IRRIGATION AND DEBRIDEMENT FOOT AND BONE;  Surgeon: Sharlotte Alamo, DPM;  Location: [facility];  Service: Podiatry;  Laterality: Left;  . IRRIGATION AND DEBRIDEMENT FOOT Left 04/19/2018   Procedure: IRRIGATION AND DEBRIDEMENT FOOT;  Surgeon: Sharlotte Alamo, DPM;  Location: [facility];  Service: Podiatry;  Laterality: Left;  . LOWER EXTREMITY ANGIOGRAPHY Left 03/05/2018   Procedure: Lower Extremity Angiography;  Surgeon: Algernon Huxley, MD;  Location: [place] [facility];  Service: Cardiovascular;  Laterality: Left;  . LOWER EXTREMITY ANGIOGRAPHY Left 03/08/2018   Procedure: LOWER EXTREMITY ANGIOGRAPHY;  Surgeon: Algernon Huxley, MD;  Location: [place] [facility];  Service: Cardiovascular;  Laterality: Left;  . ORIF ANKLE FRACTURE Left 01/31/2019   Procedure: open reduction internal fixation left bimalleolar ankle fracture;  Surgeon: Thornton Park, MD;  Location:  [facility];  Service: Orthopedics;  Laterality: Left;  . PACEMAKER INSERTION Left 11/02/2015   Procedure: INSERTION PACEMAKER;  Surgeon: Isaias Cowman, MD;  Location: [facility];  Service: Cardiovascular;  Laterality: Left;  . Pain Stimulator    . Right toe amputation      Social History:   Social History   Socioeconomic History  . Marital status: Divorced    Spouse name: Not on file  . Number of children: Not on file  . Years of education: Not on file  . Highest education level: Not on file  Occupational History  . Not on file  Tobacco Use  . Smoking status: Former Smoker    Types: Cigarettes  . Smokeless tobacco: Never Used  . Tobacco comment: quit 45 years  Substance and Sexual Activity  . Alcohol use: Not Currently    Alcohol/week: 0.0 standard drinks    Comment: have not had alcohol in 58yr  . Drug use: No  . Sexual activity: Not on file  Other Topics Concern  . Not on file  Social History Narrative  . Not on file   Social Determinants of  Health   Financial Resource Strain:   . Difficulty of Paying Living Expenses: Not on file  Food Insecurity:   . Worried About Charity fundraiser in the Last Year: Not on file  . Ran Out of Food in the Last Year: Not on file  Transportation Needs:   . Lack of Transportation (Medical): Not on file  . Lack of Transportation (Non-Medical): Not on file  Physical Activity:   . Days of Exercise per Week: Not on file  . Minutes of Exercise per Session: Not on file  Stress:   . Feeling of Stress : Not on file  Social Connections:   . Frequency of Communication with Friends and Family: Not on file  . Frequency of Social Gatherings with Friends and Family: Not on file  . Attends Religious Services: Not on file  . Active Member of Clubs or Organizations: Not on file  . Attends Archivist Meetings: Not on file  . Marital Status: Not on file  Intimate Partner Violence:   . Fear of Current or Ex-Partner: Not on file  .  Emotionally Abused: Not on file  . Physically Abused: Not on file  . Sexually Abused: Not on file    Allergies   Amoxicillin, Other, Penicillins, Hydralazine, Cephalexin, Clindamycin, Crestor [rosuvastatin calcium], Metoprolol, Red dye, Statins, Sulfa antibiotics, Yellow dyes (non-tartrazine), Aspirin, and Tape  Family history:   Family History  Problem Relation Age of Onset  . Breast cancer Mother   . Cancer Mother   . Hypertension Mother   . Lung cancer Father   . Hypertension Father   . Heart disease Father   . Cancer Father   . Kidney disease Sister   . Prostate cancer Neg Hx     Current Medications:   Prior to Admission medications   Medication Sig Start Date End Date Taking? Authorizing Provider  acetaminophen (TYLENOL) 650 MG CR tablet Take 650 mg by mouth 2 (two) times daily.    [provider]  Amino Acids-Protein Hydrolys (FEEDING SUPPLEMENT, PRO-STAT SUGAR FREE 64,) LIQD Take 30 mLs by mouth daily.    [provider]  amLODipine (NORVASC) 10 MG tablet TAKE 1 TABLET BY MOUTH ONCE DAILY Patient taking differently: Take 10 mg by mouth daily.  12/03/18   Kendell Bane, NP  aspirin EC 81 MG tablet Take 81 mg by mouth daily.    [provider]  benazepril (LOTENSIN) 20 MG tablet TAKE 1 TABLET BY MOUTH TWICE DAILY Patient taking differently: Take 20 mg by mouth 2 (two) times daily.  02/27/19   Kendell Bane, NP  ceFAZolin (ANCEF) IVPB Inject 2 g into the vein every 8 (eight) hours. Indication: MSSA ankle infection Last Day of Therapy: 05/26/2019 Labs - Once weekly:  CBC/D, CMP,  ESR and CRP 04/22/19 05/28/19  Thornton Park, MD  clopidogrel (PLAVIX) 75 MG tablet Take 1 tablet (75 mg total) by mouth daily. 06/28/18   Ronnell Freshwater, NP  DULoxetine (CYMBALTA) 30 MG capsule Take 30 mg by mouth every evening.    [provider]  DULoxetine (CYMBALTA) 60 MG capsule Take 60 mg by mouth daily.    [provider]  enoxaparin  (LOVENOX) 40 MG/0.4ML injection Inject 0.4 mLs (40 mg total) into the skin daily. 04/22/19 05/22/19  Thornton Park, MD  Exenatide ER (BYDUREON) 2 MG PEN Inject 2 mg into the skin once a week. Patient taking differently: Inject 2 mg into the skin every Sunday.  11/26/18  Kendell Bane, NP  ezetimibe (ZETIA) 10 MG tablet Take 10 mg by mouth daily.  10/29/18   [provider]  fenofibrate 54 MG tablet TAKE 1 TABLET BY MOUTH A DAY AT SUPPERTIME Patient taking differently: Take 54 mg by mouth daily at 6 PM.  12/03/18   Scarboro, Audie Clear, NP  furosemide (LASIX) 20 MG tablet Take 20 mg by mouth daily. 10/01/18   [provider]  hydrochlorothiazide (HYDRODIURIL) 12.5 MG tablet Take 1 tablet (12.5 mg total) by mouth daily. 01/31/19   Ronnell Freshwater, NP  insulin NPH Human (NOVOLIN N) 100 UNIT/ML injection Inject 45 Units into the skin 2 (two) times daily.    [provider]  insulin regular (NOVOLIN R) 100 units/mL injection Patient to use Novolin R [place] TiD prior to meals per sliding scale instructions. Max daily dose is 45 units per day. Patient taking differently: Inject 1-9 Units into the skin 3 (three) times daily. If bs is 120-150= 1 unit, 151-200=2 units, 201-250= 3 units, 251-300 = 5 units, 301-350= 7 units, 351-400= 9 units, >400 call md 06/22/18   Kendell Bane, NP  lactulose (CHRONULAC) 10 GM/15ML solution Take 20 g by mouth See admin instructions. Take 20 g in the evening, may take an additional 20 g up to 2 times daily as needed for constipation    [provider]  lidocaine (LIDODERM) 5 % Place 1 patch onto the skin daily as needed (pain). Remove & Discard patch within 12 hours or as directed by MD    [provider]  linaclotide (LINZESS) 145 MCG CAPS capsule Take 290 mcg by mouth daily before breakfast.    [provider]  Multiple Vitamin (MULTIVITAMIN WITH MINERALS) TABS tablet Take 1 tablet by mouth daily.    [provider]   NARCAN 4 MG/0.1ML LIQD Place 4 mg into the nose as needed (for opioid overdose).     [provider]  omega-3 acid ethyl esters (LOVAZA) 1 g capsule Take 1 capsule (1 g total) by mouth 2 (two) times daily. 01/24/19   Kendell Bane, NP  oxcarbazepine (TRILEPTAL) 600 MG tablet Take 600 mg by mouth 2 (two) times daily.    [provider]  oxyCODONE (OXY IR/ROXICODONE) 5 MG immediate release tablet Take 1 tablet (5 mg total) by mouth every 4 (four) hours as needed for breakthrough pain ((for MODERATE breakthrough pain)). 04/22/19   Thornton Park, MD  oxyCODONE (OXYCONTIN) 20 mg 12 hr tablet Take 1 tablet (20 mg total) by mouth every 12 (twelve) hours. 04/22/19   Thornton Park, MD  pantoprazole (PROTONIX) 40 MG tablet TAKE 1 TABLET BY MOUTH ONCE DAILY FOR REFLUX Patient taking differently: Take 40 mg by mouth daily.  02/01/19   Scarboro, Audie Clear, NP  polyethylene glycol (MIRALAX / Floria Raveling) packet Take 17 g by mouth 2 (two) times daily.     [provider]  pregabalin (LYRICA) 200 MG capsule Take 400 mg by mouth 2 (two) times daily.    [provider]  tamsulosin (FLOMAX) 0.4 MG CAPS capsule TAKE 1 CAPSULE BY MOUTH ONCE DAILY AFTERSUPPER Patient taking differently: Take 0.4 mg by mouth daily at 6 PM.  02/01/19   Scarboro, Audie Clear, NP  vitamin C (ASCORBIC ACID) 250 MG tablet Take 500 mg by mouth 2 (two) times daily.    [provider]    Physical Exam:   Vitals:   05/27/19 1715 05/27/19 1730 05/27/19 1830 05/27/19 1930  BP:  94/62 100/64 95/65 103/62  Pulse: 60 (!) 58 (!) 58 (!) 57  Resp: _0 Temp:      TempSrc:      SpO2: 95% 96% 95% 92%  Weight:      Height:         Physical Exam: Blood pressure 103/62, pulse (!) 57, temperature (!) 97.4 F (36.3 C), temperature source Oral, resp. rate 12, height _1  (1.727 m), weight 117.9 kg, SpO2 92 %. Gen: No acute distress. Head: Normocephalic, atraumatic. Eyes: Pupils equal, round and  reactive to light. Extraocular movements intact.  Sclerae nonicteric.  Mouth: Moist mucous membranes Neck: Supple, no thyromegaly, no lymphadenopathy, no jugular venous distention. Chest: Lungs are clear to auscultation with good air movement. No rales, rhonchi or wheezes.  CV: Heart sounds are regular with an S1, S2. No murmurs, rubs or gallops.  Abdomen: Soft, nontender, protuberant with normal active bowel sounds. No palpable masses. Extremities: Extremities are without clubbing, or cyanosis. No edema. Dressing on let foot was uncovered and he has left big toe amputee. Skin: Warm and dry. No rash. PICC line right arm. Neuro: Alert and oriented times 3; grossly nonfocal.  Psych: Patient clearly does not want to be bothered and is not forthcoming with inormation   Data Review:    Labs: Basic Metabolic Panel: Recent Labs  Lab 05/27/19 1347  NA 142  K 3.8  CL 104  CO2 23  GLUCOSE 160*  BUN 93*  CREATININE 2.81*  CALCIUM 8.7*   Liver Function Tests: Recent Labs  Lab 05/27/19 1347  AST 55*  ALT 16  ALKPHOS 97  BILITOT 0.8  PROT 7.1  ALBUMIN 2.7*   Recent Labs  Lab 05/27/19 1347  LIPASE 22   No results for input(s): AMMONIA in the last 168 hours. CBC: Recent Labs  Lab 05/27/19 1347  WBC 7.1  NEUTROABS 5.6  HGB 10.5*  HCT 33.8*  MCV 77.2*  PLT 281   Cardiac Enzymes: No results for input(s): CKTOTAL, CKMB, CKMBINDEX, TROPONINI in the last 168 hours.  BNP (last 3 results) No results for input(s): PROBNP in the last 8760 hours. CBG: No results for input(s): GLUCAP in the last 168 hours.  Urinalysis    Component Value Date/Time   COLORURINE YELLOW 04/24/2019 0454   APPEARANCEUR CLEAR 04/24/2019 0454   APPEARANCEUR Cloudy (A) 01/28/2019 0959   LABSPEC 1.014 04/24/2019 0454   LABSPEC 1.012 02/06/2014 2120   PHURINE 7.0 04/24/2019 0454   GLUCOSEU NEGATIVE 04/24/2019 0454   GLUCOSEU Negative 02/06/2014 2120   HGBUR NEGATIVE 04/24/2019 0454   BILIRUBINUR  NEGATIVE 04/24/2019 0454   BILIRUBINUR Negative 01/28/2019 0959   BILIRUBINUR Negative 02/06/2014 2120   [place] NEGATIVE 04/24/2019 0454   PROTEINUR NEGATIVE 04/24/2019 0454   NITRITE NEGATIVE 04/24/2019 [place] 04/24/2019 0454   LEUKOCYTESUR Negative 02/06/2014 2120      Radiographic Studies: DG Chest Port 1 View  Result Date: 05/27/2019 CLINICAL DATA:  Shortness of breath. EXAM: PORTABLE CHEST 1 VIEW COMPARISON:  February 18, 2016. FINDINGS: Stable cardiomediastinal silhouette. No pneumothorax is noted. Right-sided PICC line is noted with tip in expected position of the SVC. No pneumothorax is noted. Central pulmonary vascular congestion is noted. Mild ill-defined bilateral lung opacities are noted which may represent pneumonia. Small left pleural effusion is noted. Bony thorax is unremarkable. IMPRESSION: Right-sided PICC line is noted with tip in expected position of the SVC. Central pulmonary vascular congestion is noted. Mild ill-defined bilateral  lung opacities are noted concerning for pneumonia. Small left pleural effusion is noted. Electronically Signed   By: Marijo Conception M.D.   On: 05/27/2019 14:06    EKG: Independently reviewed.  Unable to determine rhythm but probably atrial arrhythmia.  No acute ST-T changes.   Assessment/Plan:   Principal Problem:   Pneumonia due to COVID-19 virus Active Problems:   AKI (acute kidney injury) ([place])   Acute respiratory failure with hypoxemia (HCC)   Body mass index is 39.53 kg/m.   (Morbid obesity)  COVID-19 pneumonia: Admit to telemetry.  Treat with IV remdesivir and dexamethasone. Pneumonia is likely viral so we will not continue antibiotics for now.  Acute hypoxemic respiratory failure: He is on 4 L/min oxygen via nasal cannula.  Taper off oxygen as able.  AKI: Patient got 1 L of normal saline in the ED.  Continue with cautious hydration with IV fluids and monitor BMP.  Hold Lasix, Lisinopril and  HCTZ.  Type 2 insulin-dependent diabetes mellitus: Patient is on Lantus and not NPH. This was verified by the pharmacist. Monitor glucose levels closely.  CAD with prior coronary stent: Continue aspirin, Plavix and statins  Chronic diastolic CHF/hypertension: Compensated.  Hold Lasix and HCTZ.    Hypertension: Continue antihypertensives  BPH: He is on Flomax  OSA: CPAP at night  Recent left lower extremity hardware infection: Completed antibiotics on May 26, 2019 (apparently he had been refusing IV antibiotics at the nursing home).  CODE STATUS: Patient is a full code for now as indicated by his daughter.  However, palliative care team will be consulted to establish goals of care given that patient had expressed interest in discontinuing medical care.   Other information:   DVT prophylaxis: Lovenox Code Status: Full code. Family Communication:  Disposition Plan: Possible discharge to nursing home in 3 to 4 days Consults called: None Admission status: Inpatient  The medical decision making on this patient was of high complexity and the patient is at high risk for clinical deterioration, therefore this is a level 3 visit.   Time spent 70 minutes  [facility]   How to contact the [facility] Attending or Consulting provider [place] or covering provider during after hours [place], for this patient?   1. Check the care team in [facility] and look for a) attending/consulting TR[facility] provider listed and b) the [facility] team listed 2. Log into [email or web address] and use [place]'s universal password to access. If you do not have the password, please contact the hospital operator. 3. Locate the [facility] provider you are looking for under [facility] and page to a number that you can be directly reached. 4. If you still have difficulty reaching the provider, please page the [facility] (Director on Call) for the Hospitalists listed on amion for assistance.  05/27/2019, 8:35 PM

## 2019-05-27 NOTE — ED Triage Notes (Signed)
To ER via [facility] with c/o SOB. Pt found to be hypoxic with staff, placed on NRB. 89% with EMS arrival. Placed on 4L [place] with EMS, maintained saturation of 89-90% with EMS. CBG 150. 100/72 BP. Pt refusing ABX per report. Recent left foot surgery. Pt on covid watch, tested 1/9, negative. EDP at bedside.

## 2019-05-27 NOTE — Progress Notes (Signed)
PHARMACY -  BRIEF ANTIBIOTIC NOTE   Pharmacy has received consult(s) for vancomycin and levofloxacin from an ED provider.  The patient's profile has been reviewed for ht/wt/allergies/indication/available labs.    Pharmacy consult to switch levofloxacin to cefepime + Flagyl if appropriate after penicillin allergy investigation. Unclear if true penicillin allergy, however patient has received multiple cephalosporins in the past. Will discontinue levofloxacin and give cefepime.  One time order(s) placed for vanc 1 g + cefepime 2 g + Flagyl 500 mg  Further antibiotics/pharmacy consults should be ordered by admitting physician if indicated.                       Thank you,  Tawnya Crook, PharmD 05/27/2019  1:56 PM

## 2019-05-27 NOTE — Progress Notes (Signed)
Remdesivir - Pharmacy Brief Note   O:  ALT: 16 CXR: Mild ill-defined bilateral lung opacities are noted concerning for pneumonia SpO2: 95% on 4L   A/P:  Remdesivir 200 mg IVPB once followed by 100 mg IVPB daily x 4 days.   Dorena Bodo, PharmD 05/27/2019 2:43 PM

## 2019-05-28 LAB — CBC WITH DIFFERENTIAL/PLATELET
Abs Immature Granulocytes: 0.11 10*3/uL — ABNORMAL HIGH (ref 0.00–0.07)
Basophils Absolute: 0 10*3/uL (ref 0.0–0.1)
Basophils Relative: 0 %
Eosinophils Absolute: 0 10*3/uL (ref 0.0–0.5)
Eosinophils Relative: 0 %
HCT: 30.6 % — ABNORMAL LOW (ref 39.0–52.0)
Hemoglobin: 9.3 g/dL — ABNORMAL LOW (ref 13.0–17.0)
Immature Granulocytes: 2 %
Lymphocytes Relative: 13 %
Lymphs Abs: 0.6 10*3/uL — ABNORMAL LOW (ref 0.7–4.0)
MCH: 23.5 pg — ABNORMAL LOW (ref 26.0–34.0)
MCHC: 30.4 g/dL (ref 30.0–36.0)
MCV: 77.5 fL — ABNORMAL LOW (ref 80.0–100.0)
Monocytes Absolute: 0.3 10*3/uL (ref 0.1–1.0)
Monocytes Relative: 7 %
Neutro Abs: 3.8 10*3/uL (ref 1.7–7.7)
Neutrophils Relative %: 78 %
Platelets: 266 10*3/uL (ref 150–400)
RBC: 3.95 MIL/uL — ABNORMAL LOW (ref 4.22–5.81)
RDW: 21.1 % — ABNORMAL HIGH (ref 11.5–15.5)
WBC: 4.9 10*3/uL (ref 4.0–10.5)
nRBC: 0 % (ref 0.0–0.2)

## 2019-05-28 LAB — COMPREHENSIVE METABOLIC PANEL
ALT: 13 U/L (ref 0–44)
AST: 36 U/L (ref 15–41)
Albumin: 2.3 g/dL — ABNORMAL LOW (ref 3.5–5.0)
Alkaline Phosphatase: 77 U/L (ref 38–126)
Anion gap: 15 (ref 5–15)
BUN: 93 mg/dL — ABNORMAL HIGH (ref 8–23)
CO2: 20 mmol/L — ABNORMAL LOW (ref 22–32)
Calcium: 8.2 mg/dL — ABNORMAL LOW (ref 8.9–10.3)
Chloride: 108 mmol/L (ref 98–111)
Creatinine, Ser: 2.49 mg/dL — ABNORMAL HIGH (ref 0.61–1.24)
GFR calc Af Amer: 30 mL/min — ABNORMAL LOW (ref >60)
GFR calc non Af Amer: 26 mL/min — ABNORMAL LOW (ref >60)
Glucose, Bld: 205 mg/dL — ABNORMAL HIGH (ref 70–99)
Potassium: 4.1 mmol/L (ref 3.5–5.1)
Sodium: 143 mmol/L (ref 135–145)
Total Bilirubin: 1.2 mg/dL (ref 0.3–1.2)
Total Protein: 6.7 g/dL (ref 6.5–8.1)

## 2019-05-28 LAB — GLUCOSE, CAPILLARY
Glucose-Capillary: 221 mg/dL — ABNORMAL HIGH (ref 70–99)
Glucose-Capillary: 159 mg/dL — ABNORMAL HIGH (ref 70–99)

## 2019-05-28 LAB — FERRITIN: Ferritin: 45 ng/mL (ref 24–336)

## 2019-05-28 LAB — FIBRIN DERIVATIVES D-DIMER (ARMC ONLY): Fibrin derivatives D-dimer (ARMC): 1357.92 ng/mL (FEU) — ABNORMAL HIGH (ref 0.00–499.00)

## 2019-05-28 LAB — C-REACTIVE PROTEIN: CRP: 19.4 mg/dL — ABNORMAL HIGH (ref <1.0)

## 2019-05-28 MED ORDER — HEPARIN SODIUM (PORCINE) 5000 UNIT/ML IJ SOLN: 5000.0000 [IU] | Freq: Three times a day (TID) | INTRAMUSCULAR | Status: DC

## 2019-05-28 MED ORDER — HEPARIN SODIUM (PORCINE) 5000 UNIT/ML IJ SOLN
5000.0000 [IU] | Freq: Three times a day (TID) | INTRAMUSCULAR | Status: DC
  Administered 2019-05-28: 5000 [IU] via SUBCUTANEOUS
  Filled 2019-05-28: qty 1

## 2019-05-28 MED ORDER — DULOXETINE HCL 30 MG PO CPEP: 30.0000 mg | ORAL_CAPSULE | Freq: Every evening | ORAL | 3 refills | Status: DC

## 2019-05-28 MED ORDER — SODIUM CHLORIDE 0.45 % IV SOLN: INTRAVENOUS | Status: DC

## 2019-05-28 MED ORDER — POLYETHYLENE GLYCOL 3350 17 G PO PACK: 17.0000 g | PACK | Freq: Two times a day (BID) | ORAL | 0 refills | Status: DC

## 2019-05-28 MED ORDER — ONDANSETRON HCL 4 MG PO TABS: 4.0000 mg | ORAL_TABLET | Freq: Four times a day (QID) | ORAL | 0 refills | Status: DC | PRN

## 2019-05-28 MED ORDER — ACETAMINOPHEN 325 MG PO TABS: 650.0000 mg | ORAL_TABLET | Freq: Four times a day (QID) | ORAL | Status: AC | PRN

## 2019-05-28 MED ORDER — PANTOPRAZOLE SODIUM 40 MG PO TBEC: 40.0000 mg | DELAYED_RELEASE_TABLET | Freq: Every day | ORAL | Status: DC

## 2019-05-28 MED ORDER — DEXAMETHASONE SODIUM PHOSPHATE 10 MG/ML IJ SOLN: 6.0000 mg | INTRAMUSCULAR | 0 refills | Status: DC

## 2019-05-28 MED ORDER — ONDANSETRON HCL 4 MG/2ML IJ SOLN: 4.0000 mg | Freq: Four times a day (QID) | INTRAMUSCULAR | 0 refills | Status: DC | PRN

## 2019-05-28 MED ORDER — LACTULOSE 10 GM/15ML PO SOLN: 20.0000 g | Freq: Every day | ORAL | 0 refills | Status: DC | PRN

## 2019-05-28 NOTE — ED Notes (Signed)
Pt daughter's was asking if she could come visit the pt. Explained to the daughter that she would unable to visit due to pt being COVID positive and visitor restrictions. Daughter verbalized understanding.

## 2019-05-28 NOTE — ED Notes (Signed)
Called RT to try and switch pt from BiPap to high flow [place] because pt current admitting order is for Telemetry. Pt is currently very lethargic. Waiting on RT's recommendation to keep pt on BiPap or if pt is able to tolerate high flow [place].

## 2019-05-28 NOTE — Progress Notes (Signed)
Pt taken off bipap at this time. Placed on 5lpm [place], sats 94% respiratory rate 16/min, HR 60/min. Tolerating well at this time. Will continue to monitor.

## 2019-05-28 NOTE — ED Notes (Signed)
Pt brief and pad changed. Pt clean and dry

## 2019-05-28 NOTE — Progress Notes (Signed)
PHARMACY - PHYSICIAN COMMUNICATION CRITICAL VALUE ALERT - BLOOD CULTURE IDENTIFICATION (BCID)  Victor Wise is an 69 y.o. male who presented to [facility] on 05/27/2019 with a chief complaint of pneumonia due to COVID-19 virus  Assessment:  1/4 bottles GPC. Will be sent to [facility] for further identification    Name of physician (or Provider) Contacted: Nolberto Hanlon  Current antibiotics: Remdesivir  Changes to prescribed antibiotics recommended:  Believed to be possible contaminant. MD has ordered repeat blood cultures for patient. Will continue to follow.  No results found for this or any previous visit.  Pearla Dubonnet 05/28/2019  7:58 PM

## 2019-05-28 NOTE — ED Notes (Signed)
Called the lab to send someone to do blood cultures.

## 2019-05-28 NOTE — Progress Notes (Signed)
PALLIATIVE NOTE:  Referral received for goals of care discussion. Patient is COVID + from SNF. Chart reviewed and updates received from RN.   I attempted to reach daughter,Victor Wise on numbers listed in chart. Appropriate voicemail left with my contact information. Will await a return call. I will also attempt to reach daughter at later time/date.   Detailed note and recommendations to follow once [place] discussion has been completed.   Thank you for your referral and allowing Palliative to assist in Victor Wise care.   Alda Lea, AGPCNP-BC Palliative Medicine Team  Phone: [phone] Pager: [phone] Amion: [place]. [place]   NO CHARGE

## 2019-05-28 NOTE — ED Notes (Addendum)
Pt is opening his eye spontaneously but pt is very lethargic. Pt is able to speak but unable to answer questions properly. Held lunch tray and PO medications due to lethargy. Hospitalist notified by previous nurse.

## 2019-05-28 NOTE — ED Notes (Signed)
Pt O2 sat dropped to 88% on 5L [place]. Increased flow rate to 6L [place] pt currently sat 90-92% on 6L [place]

## 2019-05-28 NOTE — ED Notes (Signed)
Pt switched to 5L [place]. Pt is currently sat 96%

## 2019-05-28 NOTE — ED Notes (Addendum)
Pt repositioned on stretcher. bipap in place. Resting quietly with eyes closed and unlabored respirations at this time. pt remains on bipap with Iv fluids infusing. Provided for comfort and safety. Will continue to assess.

## 2019-05-28 NOTE — ED Notes (Signed)
Patient on assessment was saturated with sweat. Patient was changed and cleaned and linens changed. Patient repositioned. Patient resting comfortably.

## 2019-05-28 NOTE — ED Notes (Signed)
Respiratory at bedside.

## 2019-05-28 NOTE — ED Notes (Signed)
Pt resting on stretcher with eyes closed and even respirations. No acute distress noted at this time. Iv fluids currently infusing and pt remains on c-pap for sleep. Provided for comfort and safety and will continue to assess.

## 2019-05-28 NOTE — ED Notes (Signed)
patoient responds to verbal with head nods.  Took bipap off to give meds, but he coughs and it sounds wet after I give water.  Did not give po meds.  Also his sat went down without the bipap for just a short time to 83%.

## 2019-05-28 NOTE — ED Notes (Signed)
Pt is still very lethargic, when straw was put up to the pt's mouth pt just wrapped his lips around it. Dr. Kurtis Bushman, MD has been notified. Holding all PO medications at this time.

## 2019-05-28 NOTE — Discharge Summary (Signed)
Victor Wise QIO:[id number] DOB: [birth year] DOA: 05/27/2019  PCP: Lavera Guise, MD  Admit date: 05/27/2019 Discharge date: 05/28/2019  Admitted From: Nursing Home Disposition: [facility]      Discharge Condition: Guarded CODE STATUS: Full Diet recommendation:Carb Modified, n.p.o. at this time Brief/Interim Summary: Victor Wise is an 69 y.o. male with multiple medical problems including CHF, BPH, history of basal cell cancer on the forehead, chronic back pain, CAD, depression, morbid obesity, hypertension, type 2 diabetes mellitus, GERD, obstructive sleep apnea, osteomyelitis of the foot, UTI, status post insertion of spinal cord stimulator.  He was recently discharged from the hospital on 04/23/2019 after hospitalization for infected hardware left lower extremity status post hardware removal (prescribed IV Ancef for discharge to be completed on May 26, 2019).  He was brought to the hospital because of low oxygen saturation of 60% and shortness of breath.  According to ED physician, oxygen saturation by EMS was 75% and was subsequently placed on 4 L oxygen via nasal cannula.  Apparently, he had negative Covid test at the nursing home on January 6.     The patient had unremarkable vital signs except for hypoxemia.  Covid test was positive and chest x-ray showed bilateral infiltrates.  Hospitalist team was consulted to admit for further evaluation.  Ferritin was 47, LDH 223, procalcitonin 0.14, lactic acid 1.9.  He was given IV dexamethasone, IV cefepime, IV Flagyl, 1L of normal saline and IV remdesivir in the emergency room.  He has been using BiPAP and now up to 6 L of nasal cannula.  He is still somnolent/lethargic.  At times he has been refusing meds and taking off his mask.  Consulted.  Daughter Ms. Victor Wise, at [phone] who is the healthcare power of attorney was informed by the nursing home staff that patient had been refusing treatment for the past 7 to 10 days.   Started on Covid protocol including IV Remdesivir and dexamethasone.  Antibiotics was discontinued since this was viral pneumonia. He was found with AKI and his Lasix, lisinopril, HCTZ home dose were held.  He has been receiving gentle hydration with IV fluids at 50 mils an hour since he is on the dehydrated side.  He is on sliding scale and checking fingersticks.  His other medications are being continued.  As far as his recent left lower extremity hardware infection he completed antibiotics on May 26, 2019 apparently he had been refusing IV antibiotics at the nursing home.  Patient is full code for now as indicated by his daughter from H&P.  Palliative care was consulted and is establishing goals however were unable to get hold of the daughter.  I tried calling the daughter myself left a voicemail for her.  Now that he is requiring 6 L of oxygen and he is full code we will be transferring him to [facility].  Discharge Diagnoses:  Principal Problem:   Pneumonia due to COVID-19 virus Active Problems:   AKI (acute kidney injury) ([place])   Acute respiratory failure with hypoxemia [facility])    Discharge Instructions  Discharge Instructions    Diet - low sodium heart healthy   Complete by: As directed    Increase activity slowly   Complete by: As directed      Allergies as of 05/28/2019      Reactions   Amoxicillin Other (See Comments)   Has never taken amoxicillin -ENT told him not to take (? Had skin test) Has patient had a PCN  reaction causing immediate rash, facial/tongue/throat swelling, SOB or lightheadedness with hypotension: No Has patient had a PCN reaction causing severe rash involving mucus membranes or skin necrosis: No Has patient had a PCN reaction that required hospitalization No Has patient had a PCN reaction occurring within the last 10 years: No If above answers are "NO", then may proceed w/ Cephalo   Other Anaphylaxis, Itching, Other (See Comments)   Pt states  that he is allergic to Endopa.  Reaction:  Anaphylaxis  Pt states that he is allergic to Metabisulfites Reaction:  Itching    Penicillins Other (See Comments)   Arm turned "blue" at injection site (no treatment needed) Has patient had a PCN reaction causing immediate rash, facial/tongue/throat swelling, SOB or lightheadedness with hypotension: No Has patient had a PCN reaction causing severe rash involving mucus membranes or skin necrosis: No Has patient had a PCN reaction that required hospitalization No Has patient had a PCN reaction occurring within the last 10 years: No If all of the above answers are "NO", then may proceed with Cephalosporin use.   Hydralazine Other (See Comments)   Reaction:  Cramping of extremities Pt reports tolerating low dose 12.3m but no higher.   Cephalexin Itching   Clindamycin Itching   Crestor [rosuvastatin Calcium] Other (See Comments)   Reaction:  Pt is unable to move arms/legs    Metoprolol Nausea And Vomiting   Red Dye Itching   Statins Other (See Comments)   Reaction:  Pt is unable to move arms/legs   Sulfa Antibiotics Itching   Other reaction(s): Unknown   Yellow Dyes (non-tartrazine) Itching   Aspirin Itching, Other (See Comments)   Pt states that he is able to use in lower doses.     Tape Rash   Please use "paper" tape only.      Medication List    STOP taking these medications   ceFAZolin  IVPB Commonly known as: ANCEF   furosemide 20 MG tablet Commonly known as: LASIX   hydrochlorothiazide 25 MG tablet Commonly known as: HYDRODIURIL   lisinopril 20 MG tablet Commonly known as: ZESTRIL     TAKE these medications   acetaminophen 325 MG tablet Commonly known as: TYLENOL Take 2 tablets (650 mg total) by mouth every 6 (six) hours as needed for mild pain or headache (fever >/= 101).   Align 4 MG Caps Take 4 mg by mouth daily.   amLODipine 10 MG tablet Commonly known as: NORVASC TAKE 1 TABLET BY MOUTH ONCE DAILY   aspirin EC  81 MG tablet Take 81 mg by mouth daily.   clopidogrel 75 MG tablet Commonly known as: PLAVIX Take 1 tablet (75 mg total) by mouth daily.   dexamethasone 10 MG/ML injection Commonly known as: DECADRON Inject 0.6 mLs (6 mg total) into the vein daily. Start taking on: May 29, 2019   docusate sodium 100 MG capsule Commonly known as: COLACE Take 100 mg by mouth 2 (two) times daily.   DULoxetine 30 MG capsule Commonly known as: CYMBALTA Take 1 capsule (30 mg total) by mouth every evening. What changed: Another medication with the same name was removed. Continue taking this medication, and follow the directions you see here.   ezetimibe 10 MG tablet Commonly known as: ZETIA Take 10 mg by mouth at bedtime.   gemfibrozil 600 MG tablet Commonly known as: LOPID Take 600 mg by mouth 2 (two) times daily.   heparin 5000 UNIT/ML injection Inject 1 mL (5,000 Units total) into the  skin every 8 (eight) hours.   insulin glargine 100 UNIT/ML injection Commonly known as: LANTUS Inject 24 Units into the skin 2 (two) times daily.   insulin lispro 100 UNIT/ML injection Commonly known as: HUMALOG Inject 0-12 Units into the skin See admin instructions. Inject 3 times daily before meals according to sliding scale:  0-200: 0u 201-250: 2u 251-300: 4u 301-350: 6u 351-400: 8u 401-450: 10u 451+ 12u and contact physician   lactulose 10 GM/15ML solution Commonly known as: CHRONULAC Take 30 mLs (20 g total) by mouth daily as needed for mild constipation. What changed:   when to take this  reasons to take this  additional instructions   Linzess 290 MCG Caps capsule Generic drug: linaclotide Take 290 mcg by mouth daily before breakfast.   multivitamin with minerals Tabs tablet Take 1 tablet by mouth daily.   Narcan 4 MG/0.1ML Liqd nasal spray kit Generic drug: naloxone Place 4 mg into the nose as needed (for opioid overdose).   omega-3 acid ethyl esters 1 g capsule Commonly  known as: LOVAZA Take 1 capsule (1 g total) by mouth 2 (two) times daily.   ondansetron 4 MG tablet Commonly known as: ZOFRAN Take 1 tablet (4 mg total) by mouth every 6 (six) hours as needed for nausea.   ondansetron 4 MG/2ML Soln injection Commonly known as: ZOFRAN Inject 2 mLs (4 mg total) into the vein every 6 (six) hours as needed for nausea.   oxcarbazepine 600 MG tablet Commonly known as: TRILEPTAL Take 600 mg by mouth daily.   oxyCODONE 5 MG immediate release tablet Commonly known as: Oxy IR/ROXICODONE Take 5 mg by mouth every 6 (six) hours as needed for moderate pain or severe pain.   oxyCODONE 20 mg 12 hr tablet Commonly known as: OXYCONTIN Take 1 tablet (20 mg total) by mouth every 12 (twelve) hours.   pantoprazole 40 MG tablet Commonly known as: PROTONIX Take 1 tablet (40 mg total) by mouth daily. Start taking on: May 29, 2019   polyethylene glycol 17 g packet Commonly known as: MIRALAX / GLYCOLAX Take 17 g by mouth 2 (two) times daily.   pregabalin 200 MG capsule Commonly known as: LYRICA Take 200 mg by mouth at bedtime.   pregabalin 200 MG capsule Commonly known as: LYRICA Take 400 mg by mouth daily.   tamsulosin 0.4 MG Caps capsule Commonly known as: FLOMAX TAKE 1 CAPSULE BY MOUTH ONCE DAILY AFTERSUPPER What changed: See the new instructions.   vitamin C 250 MG tablet Commonly known as: ASCORBIC ACID Take 500 mg by mouth 2 (two) times daily.   zinc sulfate 220 (50 Zn) MG capsule Take 220 mg by mouth daily.       Allergies  Allergen Reactions  . Amoxicillin Other (See Comments)    Has never taken amoxicillin -ENT told him not to take (? Had skin test) Has patient had a PCN reaction causing immediate rash, facial/tongue/throat swelling, SOB or lightheadedness with hypotension: No Has patient had a PCN reaction causing severe rash involving mucus membranes or skin necrosis: No Has patient had a PCN reaction that required hospitalization  No Has patient had a PCN reaction occurring within the last 10 years: No If above answers are "NO", then may proceed w/ Cephalo  . Other Anaphylaxis, Itching and Other (See Comments)    Pt states that he is allergic to Endopa.  Reaction:  Anaphylaxis  Pt states that he is allergic to Metabisulfites Reaction:  Itching   . Penicillins Other (  See Comments)    Arm turned "blue" at injection site (no treatment needed) Has patient had a PCN reaction causing immediate rash, facial/tongue/throat swelling, SOB or lightheadedness with hypotension: No Has patient had a PCN reaction causing severe rash involving mucus membranes or skin necrosis: No Has patient had a PCN reaction that required hospitalization No Has patient had a PCN reaction occurring within the last 10 years: No If all of the above answers are "NO", then may proceed with Cephalosporin use.  [name] Hydralazine Other (See Comments)    Reaction:  Cramping of extremities Pt reports tolerating low dose 12.71m but no higher.  . Cephalexin Itching  . Clindamycin Itching  . Crestor [Rosuvastatin Calcium] Other (See Comments)    Reaction:  Pt is unable to move arms/legs   . Metoprolol Nausea And Vomiting  . Red Dye Itching  . Statins Other (See Comments)    Reaction:  Pt is unable to move arms/legs  . Sulfa Antibiotics Itching    Other reaction(s): Unknown  . Yellow Dyes (Non-Tartrazine) Itching  . Aspirin Itching and Other (See Comments)    Pt states that he is able to use in lower doses.    . Tape Rash    Please use "paper" tape only.     Consultations:  Palliative care   Procedures/Studies: DG Chest Port 1 View  Result Date: 05/27/2019 CLINICAL DATA:  Shortness of breath. EXAM: PORTABLE CHEST 1 VIEW COMPARISON:  February 18, 2016. FINDINGS: Stable cardiomediastinal silhouette. No pneumothorax is noted. Right-sided PICC line is noted with tip in expected position of the SVC. No pneumothorax is noted. Central pulmonary vascular  congestion is noted. Mild ill-defined bilateral lung opacities are noted which may represent pneumonia. Small left pleural effusion is noted. Bony thorax is unremarkable. IMPRESSION: Right-sided PICC line is noted with tip in expected position of the SVC. Central pulmonary vascular congestion is noted. Mild ill-defined bilateral lung opacities are noted concerning for pneumonia. Small left pleural effusion is noted. Electronically Signed   By: JMarijo ConceptionM.D.   On: 05/27/2019 14:06       Subjective: Patient somnolent, lethargic.  On facemask.  Does not even open his eyes and is nonverbal with me  Discharge Exam: Vitals:   05/28/19 1430 05/28/19 1500  BP: 114/66 116/61  Pulse: (!) 58 60  Resp: 12 12  Temp:    SpO2: 91% 92%   Vitals:   05/28/19 1330 05/28/19 1400 05/28/19 1430 05/28/19 1500  BP: 118/70 114/68 114/66 116/61  Pulse: (!) 59 (!) 59 (!) 58 60  Resp: _0 Temp:      TempSrc:      SpO2: 95% 93% 91% 92%  Weight:      Height:        General: Somnolent/lethargic on facemask nonverbal, does not open eyes Cardiovascular: RRR, S1/S2 +, no rubs, no gallops Respiratory: Poor respiratory effort with decreased breath sounds no wheezing Abdominal: Soft, NT, ND, bowel sounds + obese Extremities: No edema Neuro exam: Unable to assess    The results of significant diagnostics from this hospitalization (including imaging, microbiology, ancillary and laboratory) are listed below for reference.     Microbiology: Recent Results (from the past 240 hour(s))  Blood Culture (routine x 2)     Status: None (Preliminary result)   Collection Time: 05/27/19  1:47 PM   Specimen: BLOOD  Result Value Ref Range Status   Specimen Description BLOOD LEFT ANTECUBITAL  Final   Special  Requests   Final    BOTTLES DRAWN AEROBIC AND ANAEROBIC Blood Culture results may not be optimal due to an excessive volume of blood received in culture bottles   Culture  Setup Time   Final     AEROBIC BOTTLE ONLY GRAM POSITIVE COCCI CRITICAL RESULT CALLED TO, READ BACK BY AND VERIFIED WITH: KRISTIN MERRILL AT [place] ON 05/28/19 JJB Performed at [facility], [address] [place], [place] [address]    Culture GRAM POSITIVE COCCI  Final   Report Status PENDING  Incomplete     Labs: BNP (last 3 results) No results for input(s): BNP in the last 8760 hours. Basic Metabolic Panel: Recent Labs  Lab 05/27/19 1347 05/28/19 0605  NA 142 143  K 3.8 4.1  CL 104 108  CO2 23 20*  GLUCOSE 160* 205*  BUN 93* 93*  CREATININE 2.81* 2.49*  CALCIUM 8.7* 8.2*   Liver Function Tests: Recent Labs  Lab 05/27/19 1347 05/28/19 0605  AST 55* 36  ALT 16 13  ALKPHOS 97 77  BILITOT 0.8 1.2  PROT 7.1 6.7  ALBUMIN 2.7* 2.3*   Recent Labs  Lab 05/27/19 1347  LIPASE 22   No results for input(s): AMMONIA in the last 168 hours. CBC: Recent Labs  Lab 05/27/19 1347 05/28/19 0605  WBC 7.1 4.9  NEUTROABS 5.6 3.8  HGB 10.5* 9.3*  HCT 33.8* 30.6*  MCV 77.2* 77.5*  PLT 281 266   Cardiac Enzymes: No results for input(s): CKTOTAL, CKMB, CKMBINDEX, TROPONINI in the last 168 hours. BNP: Invalid input(s): POCBNP CBG: Recent Labs  Lab 05/27/19 2245 05/28/19 0603 05/28/19 1014  GLUCAP 218* 221* 159*   D-Dimer No results for input(s): DDIMER in the last 72 hours. Hgb A1c No results for input(s): HGBA1C in the last 72 hours. Lipid Profile Recent Labs    05/27/19 1347  TRIG 187*   Thyroid function studies No results for input(s): TSH, T4TOTAL, T3FREE, THYROIDAB in the last 72 hours.  Invalid input(s): FREET3 Anemia work up Recent Labs    05/27/19 1347 05/28/19 0605  FERRITIN 47 45   Urinalysis    Component Value Date/Time   COLORURINE YELLOW 04/24/2019 [place] 04/24/2019 0454   APPEARANCEUR Cloudy (A) 01/28/2019 0959   LABSPEC 1.014 04/24/2019 0454   LABSPEC 1.012 02/06/2014 2120   PHURINE 7.0 04/24/2019 [place]  04/24/2019 0454   GLUCOSEU Negative 02/06/2014 2120   HGBUR NEGATIVE 04/24/2019 [place] NEGATIVE 04/24/2019 0454   BILIRUBINUR Negative 01/28/2019 0959   BILIRUBINUR Negative 02/06/2014 2120   [place] NEGATIVE 04/24/2019 0454   PROTEINUR NEGATIVE 04/24/2019 0454   NITRITE NEGATIVE 04/24/2019 0454   LEUKOCYTESUR NEGATIVE 04/24/2019 0454   LEUKOCYTESUR Negative 02/06/2014 2120   Sepsis Labs Invalid input(s): PROCALCITONIN,  WBC,  LACTICIDVEN Microbiology Recent Results (from the past 240 hour(s))  Blood Culture (routine x 2)     Status: None (Preliminary result)   Collection Time: 05/27/19  1:47 PM   Specimen: BLOOD  Result Value Ref Range Status   Specimen Description BLOOD LEFT ANTECUBITAL  Final   Special Requests   Final    BOTTLES DRAWN AEROBIC AND ANAEROBIC Blood Culture results may not be optimal due to an excessive volume of blood received in culture bottles   Culture  Setup Time   Final    AEROBIC BOTTLE ONLY GRAM POSITIVE COCCI CRITICAL RESULT CALLED TO, READ BACK BY AND VERIFIED WITH: KRISTIN MERRILL AT 0915 ON  05/28/19 JJB Performed at [facility], [address], [place], [place] [address]    Culture [facility] POSITIVE COCCI  Final   Report Status PENDING  Incomplete     Time coordinating discharge: Over 30 minutes  SIGNED:   Nolberto Hanlon, MD  [facility] 05/28/2019, 3:47 PM Pager   If 7PM-7AM, please contact night-coverage [email or web address] Password [email or web address]

## 2019-05-28 NOTE — ED Notes (Signed)
I did not give po meds at this time due to lethargy.  Pat placed on bipap by rt.  I did turn him.  No urine in diaper.  Has small dressing over sacrum, clean and dry.

## 2019-05-28 NOTE — ED Notes (Signed)
Attempted to apply condom cath to pt, attempt was unsuccessful

## 2019-05-29 ENCOUNTER — Encounter (HOSPITAL_COMMUNITY): Payer: Self-pay | Admitting: Internal Medicine

## 2019-05-29 ENCOUNTER — Inpatient Hospital Stay (HOSPITAL_COMMUNITY): Payer: Medicare HMO

## 2019-05-29 ENCOUNTER — Other Ambulatory Visit: Payer: Self-pay

## 2019-05-29 ENCOUNTER — Inpatient Hospital Stay (HOSPITAL_COMMUNITY)
Admission: AD | Admit: 2019-05-29 | Discharge: 2019-05-31 | DRG: 871 | Disposition: A | Discharge status: Hospice | Payer: Medicare HMO | Source: Other Acute Inpatient Hospital | Attending: Family Medicine | Admitting: Family Medicine

## 2019-05-29 DIAGNOSIS — K219 Gastro-esophageal reflux disease without esophagitis: Secondary | ICD-10-CM | POA: Diagnosis present

## 2019-05-29 DIAGNOSIS — Z881 Allergy status to other antibiotic agents status: Secondary | ICD-10-CM

## 2019-05-29 DIAGNOSIS — G9341 Metabolic encephalopathy: Secondary | ICD-10-CM | POA: Diagnosis present

## 2019-05-29 DIAGNOSIS — J1282 Pneumonia due to coronavirus disease 2019: Secondary | ICD-10-CM | POA: Diagnosis present

## 2019-05-29 DIAGNOSIS — I10 Essential (primary) hypertension: Secondary | ICD-10-CM | POA: Diagnosis not present

## 2019-05-29 DIAGNOSIS — N390 Urinary tract infection, site not specified: Secondary | ICD-10-CM | POA: Diagnosis present

## 2019-05-29 DIAGNOSIS — J9601 Acute respiratory failure with hypoxia: Secondary | ICD-10-CM | POA: Diagnosis not present

## 2019-05-29 DIAGNOSIS — Z87891 Personal history of nicotine dependence: Secondary | ICD-10-CM | POA: Diagnosis not present

## 2019-05-29 DIAGNOSIS — N400 Enlarged prostate without lower urinary tract symptoms: Secondary | ICD-10-CM | POA: Diagnosis present

## 2019-05-29 DIAGNOSIS — I110 Hypertensive heart disease with heart failure: Secondary | ICD-10-CM | POA: Diagnosis present

## 2019-05-29 DIAGNOSIS — B9689 Other specified bacterial agents as the cause of diseases classified elsewhere: Secondary | ICD-10-CM | POA: Diagnosis present

## 2019-05-29 DIAGNOSIS — Z8673 Personal history of transient ischemic attack (TIA), and cerebral infarction without residual deficits: Secondary | ICD-10-CM

## 2019-05-29 DIAGNOSIS — R0902 Hypoxemia: Secondary | ICD-10-CM

## 2019-05-29 DIAGNOSIS — Y92129 Unspecified place in nursing home as the place of occurrence of the external cause: Secondary | ICD-10-CM

## 2019-05-29 DIAGNOSIS — R509 Fever, unspecified: Secondary | ICD-10-CM

## 2019-05-29 DIAGNOSIS — F1190 Opioid use, unspecified, uncomplicated: Secondary | ICD-10-CM

## 2019-05-29 DIAGNOSIS — L89620 Pressure ulcer of left heel, unstageable: Secondary | ICD-10-CM | POA: Diagnosis present

## 2019-05-29 DIAGNOSIS — G4733 Obstructive sleep apnea (adult) (pediatric): Secondary | ICD-10-CM

## 2019-05-29 DIAGNOSIS — Z515 Encounter for palliative care: Secondary | ICD-10-CM | POA: Diagnosis not present

## 2019-05-29 DIAGNOSIS — G894 Chronic pain syndrome: Secondary | ICD-10-CM | POA: Diagnosis not present

## 2019-05-29 DIAGNOSIS — G92 Toxic encephalopathy: Secondary | ICD-10-CM | POA: Diagnosis present

## 2019-05-29 DIAGNOSIS — I5032 Chronic diastolic (congestive) heart failure: Secondary | ICD-10-CM | POA: Diagnosis present

## 2019-05-29 DIAGNOSIS — F331 Major depressive disorder, recurrent, moderate: Secondary | ICD-10-CM | POA: Diagnosis present

## 2019-05-29 DIAGNOSIS — E1165 Type 2 diabetes mellitus with hyperglycemia: Secondary | ICD-10-CM | POA: Diagnosis not present

## 2019-05-29 DIAGNOSIS — A4189 Other specified sepsis: Secondary | ICD-10-CM | POA: Diagnosis not present

## 2019-05-29 DIAGNOSIS — U071 COVID-19: Secondary | ICD-10-CM | POA: Diagnosis present

## 2019-05-29 DIAGNOSIS — G9340 Encephalopathy, unspecified: Secondary | ICD-10-CM | POA: Diagnosis not present

## 2019-05-29 DIAGNOSIS — N3000 Acute cystitis without hematuria: Secondary | ICD-10-CM | POA: Diagnosis not present

## 2019-05-29 DIAGNOSIS — Z803 Family history of malignant neoplasm of breast: Secondary | ICD-10-CM

## 2019-05-29 DIAGNOSIS — Z882 Allergy status to sulfonamides status: Secondary | ICD-10-CM

## 2019-05-29 DIAGNOSIS — Z880 Allergy status to penicillin: Secondary | ICD-10-CM

## 2019-05-29 DIAGNOSIS — Z841 Family history of disorders of kidney and ureter: Secondary | ICD-10-CM

## 2019-05-29 DIAGNOSIS — Z79891 Long term (current) use of opiate analgesic: Secondary | ICD-10-CM

## 2019-05-29 DIAGNOSIS — M549 Dorsalgia, unspecified: Secondary | ICD-10-CM | POA: Diagnosis present

## 2019-05-29 DIAGNOSIS — N179 Acute kidney failure, unspecified: Secondary | ICD-10-CM | POA: Diagnosis present

## 2019-05-29 DIAGNOSIS — Z888 Allergy status to other drugs, medicaments and biological substances status: Secondary | ICD-10-CM

## 2019-05-29 DIAGNOSIS — Z7982 Long term (current) use of aspirin: Secondary | ICD-10-CM

## 2019-05-29 DIAGNOSIS — Z9989 Dependence on other enabling machines and devices: Secondary | ICD-10-CM | POA: Diagnosis not present

## 2019-05-29 DIAGNOSIS — Z66 Do not resuscitate: Secondary | ICD-10-CM | POA: Diagnosis present

## 2019-05-29 DIAGNOSIS — Z6837 Body mass index (BMI) 37.0-37.9, adult: Secondary | ICD-10-CM | POA: Diagnosis not present

## 2019-05-29 DIAGNOSIS — R7989 Other specified abnormal findings of blood chemistry: Secondary | ICD-10-CM

## 2019-05-29 DIAGNOSIS — Z79899 Other long term (current) drug therapy: Secondary | ICD-10-CM

## 2019-05-29 DIAGNOSIS — Z8249 Family history of ischemic heart disease and other diseases of the circulatory system: Secondary | ICD-10-CM

## 2019-05-29 DIAGNOSIS — Z89422 Acquired absence of other left toe(s): Secondary | ICD-10-CM

## 2019-05-29 DIAGNOSIS — Z87440 Personal history of urinary (tract) infections: Secondary | ICD-10-CM | POA: Diagnosis not present

## 2019-05-29 DIAGNOSIS — Z7902 Long term (current) use of antithrombotics/antiplatelets: Secondary | ICD-10-CM

## 2019-05-29 DIAGNOSIS — Z886 Allergy status to analgesic agent status: Secondary | ICD-10-CM

## 2019-05-29 DIAGNOSIS — T402X5A Adverse effect of other opioids, initial encounter: Secondary | ICD-10-CM | POA: Diagnosis present

## 2019-05-29 DIAGNOSIS — Z794 Long term (current) use of insulin: Secondary | ICD-10-CM

## 2019-05-29 DIAGNOSIS — Z801 Family history of malignant neoplasm of trachea, bronchus and lung: Secondary | ICD-10-CM

## 2019-05-29 LAB — CBC WITH DIFFERENTIAL/PLATELET
Abs Immature Granulocytes: 0.12 10*3/uL — ABNORMAL HIGH (ref 0.00–0.07)
Basophils Absolute: 0 10*3/uL (ref 0.0–0.1)
Basophils Relative: 0 %
Eosinophils Absolute: 0 10*3/uL (ref 0.0–0.5)
Eosinophils Relative: 0 %
HCT: 31 % — ABNORMAL LOW (ref 39.0–52.0)
Hemoglobin: 9.6 g/dL — ABNORMAL LOW (ref 13.0–17.0)
Immature Granulocytes: 2 %
Lymphocytes Relative: 9 %
Lymphs Abs: 0.6 10*3/uL — ABNORMAL LOW (ref 0.7–4.0)
MCH: 23.9 pg — ABNORMAL LOW (ref 26.0–34.0)
MCHC: 31 g/dL (ref 30.0–36.0)
MCV: 77.3 fL — ABNORMAL LOW (ref 80.0–100.0)
Monocytes Absolute: 0.2 10*3/uL (ref 0.1–1.0)
Monocytes Relative: 3 %
Neutro Abs: 6.1 10*3/uL (ref 1.7–7.7)
Neutrophils Relative %: 86 %
Platelets: 264 10*3/uL (ref 150–400)
RBC: 4.01 MIL/uL — ABNORMAL LOW (ref 4.22–5.81)
RDW: 21.2 % — ABNORMAL HIGH (ref 11.5–15.5)
WBC: 7 10*3/uL (ref 4.0–10.5)
nRBC: 0 % (ref 0.0–0.2)

## 2019-05-29 LAB — COMPREHENSIVE METABOLIC PANEL
ALT: 12 U/L (ref 0–44)
AST: 30 U/L (ref 15–41)
Albumin: 2.4 g/dL — ABNORMAL LOW (ref 3.5–5.0)
Alkaline Phosphatase: 73 U/L (ref 38–126)
Anion gap: 11 (ref 5–15)
BUN: 84 mg/dL — ABNORMAL HIGH (ref 8–23)
CO2: 23 mmol/L (ref 22–32)
Calcium: 8.5 mg/dL — ABNORMAL LOW (ref 8.9–10.3)
Chloride: 114 mmol/L — ABNORMAL HIGH (ref 98–111)
Creatinine, Ser: 1.54 mg/dL — ABNORMAL HIGH (ref 0.61–1.24)
GFR calc Af Amer: 53 mL/min — ABNORMAL LOW (ref >60)
GFR calc non Af Amer: 46 mL/min — ABNORMAL LOW (ref >60)
Glucose, Bld: 110 mg/dL — ABNORMAL HIGH (ref 70–99)
Potassium: 3.4 mmol/L — ABNORMAL LOW (ref 3.5–5.1)
Sodium: 148 mmol/L — ABNORMAL HIGH (ref 135–145)
Total Bilirubin: 1.2 mg/dL (ref 0.3–1.2)
Total Protein: 6.5 g/dL (ref 6.5–8.1)

## 2019-05-29 LAB — BRAIN NATRIURETIC PEPTIDE: B Natriuretic Peptide: 300.8 pg/mL — ABNORMAL HIGH (ref 0.0–100.0)

## 2019-05-29 LAB — MAGNESIUM: Magnesium: 2.3 mg/dL (ref 1.7–2.4)

## 2019-05-29 LAB — FIBRINOGEN: Fibrinogen: 796 mg/dL — ABNORMAL HIGH (ref 210–475)

## 2019-05-29 LAB — FOLATE: Folate: 16.1 ng/mL (ref >5.9)

## 2019-05-29 LAB — C-REACTIVE PROTEIN
CRP: 16.6 mg/dL — ABNORMAL HIGH (ref <1.0)
CRP: 16.9 mg/dL — ABNORMAL HIGH (ref <1.0)

## 2019-05-29 LAB — PROCALCITONIN: Procalcitonin: 0.35 ng/mL

## 2019-05-29 LAB — CULTURE, BLOOD (ROUTINE X 2)

## 2019-05-29 LAB — FERRITIN
Ferritin: 52 ng/mL (ref 24–336)
Ferritin: 55 ng/mL (ref 24–336)

## 2019-05-29 LAB — GLUCOSE, CAPILLARY
Glucose-Capillary: 130 mg/dL — ABNORMAL HIGH (ref 70–99)
Glucose-Capillary: 108 mg/dL — ABNORMAL HIGH (ref 70–99)
Glucose-Capillary: 115 mg/dL — ABNORMAL HIGH (ref 70–99)
Glucose-Capillary: 106 mg/dL — ABNORMAL HIGH (ref 70–99)
Glucose-Capillary: 122 mg/dL — ABNORMAL HIGH (ref 70–99)
Glucose-Capillary: 142 mg/dL — ABNORMAL HIGH (ref 70–99)

## 2019-05-29 LAB — MRSA PCR SCREENING: MRSA by PCR: POSITIVE — AB

## 2019-05-29 LAB — TSH: TSH: 0.11 u[IU]/mL — ABNORMAL LOW (ref 0.350–4.500)

## 2019-05-29 LAB — D-DIMER, QUANTITATIVE: D-Dimer, Quant: 1.16 ug/mL-FEU — ABNORMAL HIGH (ref 0.00–0.50)

## 2019-05-29 LAB — VITAMIN B12: Vitamin B-12: 798 pg/mL (ref 180–914)

## 2019-05-29 LAB — ABO/RH: ABO/RH(D): O POS

## 2019-05-29 LAB — AMMONIA: Ammonia: 25 umol/L (ref 9–35)

## 2019-05-29 LAB — PHOSPHORUS: Phosphorus: 3.2 mg/dL (ref 2.5–4.6)

## 2019-05-29 MED ORDER — OXCARBAZEPINE 300 MG PO TABS
600.0000 mg | ORAL_TABLET | Freq: Every day | ORAL | Status: DC
  Filled 2019-05-29 (×2): qty 2

## 2019-05-29 MED ORDER — ASCORBIC ACID 500 MG PO TABS: 500.0000 mg | ORAL_TABLET | Freq: Every day | ORAL | Status: DC

## 2019-05-29 MED ORDER — DEXTROSE-NACL 5-0.45 % IV SOLN: INTRAVENOUS | Status: DC

## 2019-05-29 MED ORDER — SODIUM CHLORIDE 0.9% FLUSH: 10.0000 mL | INTRAVENOUS | Status: DC | PRN

## 2019-05-29 MED ORDER — INSULIN ASPART 100 UNIT/ML ~~LOC~~ SOLN
0.0000 [IU] | SUBCUTANEOUS | Status: DC
  Administered 2019-05-29: 22:00:00 1 [IU] via SUBCUTANEOUS
  Administered 2019-05-30: 12:00:00 7 [IU] via SUBCUTANEOUS
  Administered 2019-05-30: 01:00:00 1 [IU] via SUBCUTANEOUS
  Administered 2019-05-30 (×2): 3 [IU] via SUBCUTANEOUS

## 2019-05-29 MED ORDER — OXYCODONE HCL 5 MG PO TABS: 5.0000 mg | ORAL_TABLET | Freq: Four times a day (QID) | ORAL | Status: DC | PRN

## 2019-05-29 MED ORDER — DEXAMETHASONE 6 MG PO TABS: 6.0000 mg | ORAL_TABLET | ORAL | Status: DC

## 2019-05-29 MED ORDER — INSULIN GLARGINE 100 UNIT/ML ~~LOC~~ SOLN
15.0000 [IU] | Freq: Two times a day (BID) | SUBCUTANEOUS | Status: DC
  Administered 2019-05-29 – 2019-05-30 (×2): 15 [IU] via SUBCUTANEOUS
  Filled 2019-05-29 (×3): qty 0.15

## 2019-05-29 MED ORDER — SODIUM CHLORIDE 0.9 % IV SOLN
1.0000 g | INTRAVENOUS | Status: DC
  Administered 2019-05-29: 13:00:00 1 g via INTRAVENOUS
  Filled 2019-05-29: qty 10

## 2019-05-29 MED ORDER — VANCOMYCIN HCL 1250 MG/250ML IV SOLN
1250.0000 mg | INTRAVENOUS | Status: DC
  Filled 2019-05-29: qty 250

## 2019-05-29 MED ORDER — CHLORHEXIDINE GLUCONATE CLOTH 2 % EX PADS
6.0000 | MEDICATED_PAD | Freq: Every day | CUTANEOUS | Status: DC
  Administered 2019-05-29: 13:00:00 6 via TOPICAL

## 2019-05-29 MED ORDER — TAMSULOSIN HCL 0.4 MG PO CAPS: 0.4000 mg | ORAL_CAPSULE | Freq: Every day | ORAL | Status: DC

## 2019-05-29 MED ORDER — ONDANSETRON HCL 4 MG/2ML IJ SOLN: 4.0000 mg | Freq: Four times a day (QID) | INTRAMUSCULAR | Status: DC | PRN

## 2019-05-29 MED ORDER — DULOXETINE HCL 30 MG PO CPEP
30.0000 mg | ORAL_CAPSULE | Freq: Every evening | ORAL | Status: DC
  Filled 2019-05-29 (×2): qty 1

## 2019-05-29 MED ORDER — METHYLPREDNISOLONE SODIUM SUCC 125 MG IJ SOLR
60.0000 mg | Freq: Two times a day (BID) | INTRAMUSCULAR | Status: DC
  Administered 2019-05-29 – 2019-05-30 (×2): 60 mg via INTRAVENOUS
  Filled 2019-05-29 (×2): qty 2

## 2019-05-29 MED ORDER — SODIUM CHLORIDE 0.9 % IV SOLN
100.0000 mg | Freq: Every day | INTRAVENOUS | Status: DC
  Administered 2019-05-29 – 2019-05-30 (×2): 100 mg via INTRAVENOUS
  Filled 2019-05-29 (×2): qty 20

## 2019-05-29 MED ORDER — VANCOMYCIN HCL 2000 MG/400ML IV SOLN
2000.0000 mg | Freq: Once | INTRAVENOUS | Status: AC
  Administered 2019-05-29: 2000 mg via INTRAVENOUS
  Filled 2019-05-29: qty 400

## 2019-05-29 MED ORDER — DOCUSATE SODIUM 100 MG PO CAPS: 100.0000 mg | ORAL_CAPSULE | Freq: Every day | ORAL | Status: DC

## 2019-05-29 MED ORDER — IPRATROPIUM-ALBUTEROL 20-100 MCG/ACT IN AERS: 1.0000 | INHALATION_SPRAY | Freq: Four times a day (QID) | RESPIRATORY_TRACT | Status: DC

## 2019-05-29 MED ORDER — SODIUM CHLORIDE 0.9 % IV SOLN
2.0000 g | Freq: Two times a day (BID) | INTRAVENOUS | Status: DC
  Administered 2019-05-29: 19:00:00 2 g via INTRAVENOUS
  Filled 2019-05-29: qty 2

## 2019-05-29 MED ORDER — PREGABALIN 100 MG PO CAPS: 200.0000 mg | ORAL_CAPSULE | Freq: Every day | ORAL | Status: DC

## 2019-05-29 MED ORDER — INSULIN ASPART 100 UNIT/ML ~~LOC~~ SOLN: 0.0000 [IU] | Freq: Three times a day (TID) | SUBCUTANEOUS | Status: DC

## 2019-05-29 MED ORDER — AMLODIPINE BESYLATE 10 MG PO TABS: 10.0000 mg | ORAL_TABLET | Freq: Every day | ORAL | Status: DC

## 2019-05-29 MED ORDER — PANTOPRAZOLE SODIUM 40 MG PO TBEC: 40.0000 mg | DELAYED_RELEASE_TABLET | Freq: Every day | ORAL | Status: DC

## 2019-05-29 MED ORDER — CLOPIDOGREL BISULFATE 75 MG PO TABS: 75.0000 mg | ORAL_TABLET | Freq: Every day | ORAL | Status: DC

## 2019-05-29 MED ORDER — SODIUM CHLORIDE 0.9% FLUSH
10.0000 mL | Freq: Two times a day (BID) | INTRAVENOUS | Status: DC
  Administered 2019-05-29 – 2019-05-31 (×5): 10 mL

## 2019-05-29 MED ORDER — ACETAMINOPHEN 325 MG PO TABS: 650.0000 mg | ORAL_TABLET | Freq: Four times a day (QID) | ORAL | Status: DC | PRN

## 2019-05-29 MED ORDER — PREGABALIN 100 MG PO CAPS: 400.0000 mg | ORAL_CAPSULE | Freq: Every day | ORAL | Status: DC

## 2019-05-29 MED ORDER — ZINC SULFATE 220 (50 ZN) MG PO CAPS: 220.0000 mg | ORAL_CAPSULE | Freq: Every day | ORAL | Status: DC

## 2019-05-29 MED ORDER — ASPIRIN EC 81 MG PO TBEC: 81.0000 mg | DELAYED_RELEASE_TABLET | Freq: Every day | ORAL | Status: DC

## 2019-05-29 MED ORDER — SODIUM CHLORIDE 0.9 % IV SOLN
2.0000 g | Freq: Two times a day (BID) | INTRAVENOUS | Status: DC
  Administered 2019-05-30: 05:00:00 2 g via INTRAVENOUS
  Filled 2019-05-29: qty 2

## 2019-05-29 MED ORDER — HYDROCOD POLST-CPM POLST ER 10-8 MG/5ML PO SUER: 5.0000 mL | Freq: Two times a day (BID) | ORAL | Status: DC | PRN

## 2019-05-29 MED ORDER — ENOXAPARIN SODIUM 60 MG/0.6ML ~~LOC~~ SOLN
55.0000 mg | SUBCUTANEOUS | Status: DC
  Administered 2019-05-29 – 2019-05-30 (×2): 55 mg via SUBCUTANEOUS
  Filled 2019-05-29 (×2): qty 0.6

## 2019-05-29 MED ORDER — ONDANSETRON HCL 4 MG PO TABS: 4.0000 mg | ORAL_TABLET | Freq: Four times a day (QID) | ORAL | Status: DC | PRN

## 2019-05-29 MED ORDER — OXYCODONE HCL ER 10 MG PO T12A: 20.0000 mg | EXTENDED_RELEASE_TABLET | Freq: Two times a day (BID) | ORAL | Status: DC

## 2019-05-29 MED ORDER — GUAIFENESIN-DM 100-10 MG/5ML PO SYRP: 10.0000 mL | ORAL_SOLUTION | ORAL | Status: DC | PRN

## 2019-05-29 MED ORDER — SODIUM CHLORIDE 0.9% IV SOLUTION: Freq: Once | INTRAVENOUS | Status: AC

## 2019-05-29 MED ORDER — INSULIN GLARGINE 100 UNIT/ML ~~LOC~~ SOLN
20.0000 [IU] | Freq: Two times a day (BID) | SUBCUTANEOUS | Status: DC
  Filled 2019-05-29 (×2): qty 0.2

## 2019-05-29 NOTE — ED Notes (Signed)
Pt assisted with changing his depends. Large amount of urine present. Pt tolerated well. repositioned and provided for comfort and safety. Preparing pt for transport to [facility].

## 2019-05-29 NOTE — Evaluation (Signed)
Clinical/Bedside Swallow Evaluation Patient Details  Name: Victor [facility] MRN: [id number] Date of Birth: 02/21/1951  Today's Date: 05/29/2019 Time: SLP Start Time (ACUTE ONLY): 1326 SLP Stop Time (ACUTE ONLY): 1337 SLP Time Calculation (min) (ACUTE ONLY): 11 min  Past Medical History:  Past Medical History:  Diagnosis Date  . Allergy   . Basal cell carcinoma    forehead  . BPH (benign prostatic hyperplasia)   . CHF (congestive heart failure) ([place])   . Chronic back pain   . Coronary artery disease   . Depression   . Diabetes ([place])   . GERD (gastroesophageal reflux disease)   . Heart attack ([place])    2018  . Hypertension   . Morbid obesity ([place])   . Obstructive sleep apnea   . Osteomyelitis of foot ([place])   . Status post insertion of spinal cord stimulator   . Stroke [facility])    last one 2-3 years ago  . UTI (lower urinary tract infection)    Past Surgical History:  Past Surgical History:  Procedure Laterality Date  . AMPUTATION TOE Left 04/19/2018   Procedure: AMPUTATION TOE-LEFT GREAT TOE;  Surgeon: Sharlotte Alamo, DPM;  Location: [facility];  Service: Podiatry;  Laterality: Left;  . APPLICATION OF A-CELL OF EXTREMITY Left 04/16/2019   Procedure: APPLICATION OF A-CELL OF EXTREMITY;  Surgeon: Thornton Park, MD;  Location: [facility];  Service: Orthopedics;  Laterality: Left;  . APPLICATION OF WOUND VAC Left 04/16/2019   Procedure: APPLICATION OF WOUND VAC;  Surgeon: Thornton Park, MD;  Location: [facility];  Service: Orthopedics;  Laterality: Left;  . APPLICATION OF WOUND VAC Left 04/12/2019   Procedure: APPLICATION OF WOUND VAC;  Surgeon: Thornton Park, MD;  Location: [facility];  Service: Orthopedics;  Laterality: Left;  . CARDIAC CATHETERIZATION N/A 12/19/2014   Procedure: Coronary Stent Intervention;  Surgeon: Charolette Forward, MD;  Location: [facility];  Service: Cardiovascular;  Laterality: N/A;  . CARDIAC CATHETERIZATION Left 12/19/2014   Procedure: Left Heart Cath and  Coronary Angiography;  Surgeon: Dionisio Eddie, MD;  Location: [facility];  Service: Cardiovascular;  Laterality: Left;  . HARDWARE REMOVAL Left 04/12/2019   Procedure: HARDWARE REMOVAL;  Surgeon: Thornton Park, MD;  Location: [facility];  Service: Orthopedics;  Laterality: Left;  . INCISION AND DRAINAGE OF WOUND Left 04/12/2019   Procedure: IRRIGATION AND DEBRIDEMENT WOUND, WOUND VAC EXCHANGE;  Surgeon: Thornton Park, MD;  Location: [facility];  Service: Orthopedics;  Laterality: Left;  . INCISION AND DRAINAGE OF WOUND Left 04/12/2019   Procedure: IRRIGATION AND DEBRIDEMENT WOUND;  Surgeon: Thornton Park, MD;  Location: [facility];  Service: Orthopedics;  Laterality: Left;  . IRRIGATION AND DEBRIDEMENT FOOT Left 03/02/2018   Procedure: IRRIGATION AND DEBRIDEMENT FOOT;  Surgeon: Samara Deist, DPM;  Location: [facility];  Service: Podiatry;  Laterality: Left;  . IRRIGATION AND DEBRIDEMENT FOOT Left 03/08/2018   Procedure: IRRIGATION AND DEBRIDEMENT FOOT AND BONE;  Surgeon: Sharlotte Alamo, DPM;  Location: [facility];  Service: Podiatry;  Laterality: Left;  . IRRIGATION AND DEBRIDEMENT FOOT Left 04/19/2018   Procedure: IRRIGATION AND DEBRIDEMENT FOOT;  Surgeon: Sharlotte Alamo, DPM;  Location: [facility];  Service: Podiatry;  Laterality: Left;  . LOWER EXTREMITY ANGIOGRAPHY Left 03/05/2018   Procedure: Lower Extremity Angiography;  Surgeon: Algernon Huxley, MD;  Location: [facility];  Service: Cardiovascular;  Laterality: Left;  . LOWER EXTREMITY ANGIOGRAPHY Left 03/08/2018   Procedure: LOWER EXTREMITY ANGIOGRAPHY;  Surgeon: Algernon Huxley, MD;  Location: [facility];  Service: Cardiovascular;  Laterality: Left;  . ORIF ANKLE FRACTURE Left 01/31/2019   Procedure: open reduction internal fixation left bimalleolar ankle fracture;  Surgeon: Thornton Park, MD;  Location: [facility];  Service: Orthopedics;  Laterality: Left;  . PACEMAKER INSERTION Left 11/02/2015   Procedure: INSERTION  PACEMAKER;  Surgeon: Isaias Cowman, MD;  Location: [facility];  Service: Cardiovascular;  Laterality: Left;  . Pain Stimulator    . Right toe amputation     HPI:  69 y.o. male, admitted from SNF, with CHF, BPH, chronic back pain, chronic narcotic use, depression, obesity, HTN, GERD, OSA, recurrent UTIs who presents as a transfer from [facility] for management of respiratory failure with hypoxia secondary to SARS-CoV-2 pneumonia. Pt had recent admission at [facility] 11/25-12/12/2018 for treatment of infected left lower extremity hardware. Has been refusing treatment.  Palliative care consulted, but they have not been able to reach his daughter/POA.   Assessment / Plan / Recommendation Clinical Impression  Pt participated in limited swallow assessment due to lethargy.  He was poorly alert; he could be aroused briefly and verbalized unintelligibly but did not follow commands.  He accepted minimal ice chips and water from spoon, but demonstrated poor awareness, spillage from mouth, eventual swallow and consistent coughing.  He is not sufficiently alert to eat safely.  Recommend NPO for now; if pt becomes more alert this evening, allow sips/chips and meds crushed in puree.  SLP will follow for improvements/PO readiness.  SLP Visit Diagnosis: Dysphagia, unspecified (R13.10)    Aspiration Risk       Diet Recommendation   NPO.  If he wakes up, allow sips/chips and meds crushed in puree  Medication Administration: Crushed with puree    Other  Recommendations Oral Care Recommendations: Oral care QID   Follow up Recommendations Other (comment)(tba)      Frequency and Duration min 3x week  2 weeks       Prognosis Prognosis for Safe Diet Advancement: Good      Swallow Study   General HPI: 69 y.o. male, admitted from SNF, with CHF, BPH, chronic back pain, chronic narcotic use, depression, obesity, HTN, GERD, OSA, recurrent UTIs who presents as a transfer from [facility] for management of respiratory failure  with hypoxia secondary to SARS-CoV-2 pneumonia. Pt had recent admission at [facility] 11/25-12/12/2018 for treatment of infected left lower extremity hardware. Has been refusing treatment.  Palliative care consulted, but they have not been able to reach his daughter/POA. Type of Study: Bedside Swallow Evaluation Previous Swallow Assessment: no Diet Prior to this Study: Regular;Thin liquids Temperature Spikes Noted: Yes Respiratory Status: Nasal cannula(6L) History of Recent Intubation: No Behavior/Cognition: Lethargic/Drowsy Oral Cavity Assessment: Dry Oral Care Completed by SLP: (attempted) Self-Feeding Abilities: Total assist Patient Positioning: Upright in bed Baseline Vocal Quality: Low vocal intensity Volitional Cough: Cognitively unable to elicit Volitional Swallow: Unable to elicit    Oral/Motor/Sensory Function Overall Oral Motor/Sensory Function: Other (comment)(symmetric at baseline)   Ice Chips Ice chips: Impaired Oral Phase Impairments: Poor awareness of bolus Oral Phase Functional Implications: Prolonged oral transit Pharyngeal Phase Impairments: Cough - Immediate   Thin Liquid Thin Liquid: Impaired Presentation: Spoon Oral Phase Impairments: Poor awareness of bolus Pharyngeal  Phase Impairments: Cough - Immediate    Nectar Thick Nectar Thick Liquid: Not tested   Honey Thick Honey Thick Liquid: Not tested   Puree Puree: Not tested   Solid     Solid: Not tested      Juan Quam  Laurice 05/29/2019,1:40 PM  Caliann Leckrone L. Tivis Ringer, [place] Office number [phone] Pager [phone]

## 2019-05-29 NOTE — Progress Notes (Signed)
Patient arrived to room from [facility] ED.  Assessment complete, VS obtained, and Admission database began.  MD notified.

## 2019-05-29 NOTE — Progress Notes (Signed)
Notified Dr. Florene Glen that the Plasma was threw in trash by EVS staff, that pt received 135ml out of 276ml and that blood bank stated that they would need a new order for a new bag.   Per Dr. Florene Glen  may watch and reorder tomorrow if any change for now, i think we can probably hold off since he got more than half

## 2019-05-29 NOTE — Progress Notes (Signed)
Right lower extremity venous duplex has been completed. Preliminary results can be found in CV Proc through chart review.   05/29/19 12:36 PM [facility] RVT

## 2019-05-29 NOTE — Progress Notes (Addendum)
PROGRESS NOTE    Victor [facility]  [id number] DOB: 04-26-1951 DOA: 05/29/2019 PCP: Victor Guise, MD   Brief Narrative:  Victor [facility]  is [facility] 69 y.o. male, with CHF, BPH, chronic back pain, chronic narcotic use, depression, obesity, HTN, GERD, OSA, recurrent UTIs who presents as [facility] transfer from [facility] for management of respiratory failure with hypoxia secondary to SARS-CoV-2 pneumonia.  The patient resides in the nursing home, was noted to be hypoxic with sats in the 60s and transported to the emergency room.  Supplemental oxygen was initiated to the ED.  In the emergency room SARS-CoV-2 test was positive, CXR revealed bilateral lung infiltrates. Ferritin was 47, LDH 223, procalcitonin 0.14, lactic acid 1.9. He was given IV dexamethasone, IV cefepime, IV Flagyl,1Lof normal salineand IV remdesivir in the emergency room.  He was transiently on BiPAP, he has been refusing to take his medications stating he wanted, palliative care was consulted, unfortunately they are unable to reach the daughter (Victor [facility] [id number] is POA.  Of note patient was on the hospitalist service at [facility] 11/25-12/12/2018 undergoing treatment for infected left lower extremity hardware, intraoperative cultures grew MSSA and was discharged on Ancef which was due to be completed 05/26/2019.  Assessment & Plan:   Principal Problem:   Acute hypoxemic respiratory failure due to severe acute respiratory syndrome coronavirus 2 (SARS-CoV-2) disease (HCC) Active Problems:   Uncontrolled type 2 diabetes mellitus with hyperglycemia (HCC)   Essential hypertension   OSA on CPAP   Chronic diastolic CHF (congestive heart failure), NYHA class 2 (HCC)   Chronic, continuous use of opioids   Obesity, Class III, BMI 40-49.9 (morbid obesity) (HCC)   Pain syndrome, chronic   Urinary tract infection   Moderate episode of recurrent major depressive disorder (HCC)  Acute Hypoxic Respiratory Failure Secondary to COVID 19  Pneumonia:  Requiring 6 L satting in high 80's and low 90's Continue steroids, remdesivir Discussed convalescent plasma, daughter agreeable after going over consent form I/O, daily weights IS, prone as able, OOB Daily inflammatory labs - pending labs today LE [place]  COVID-19 Labs  Recent Labs    05/27/19 1347 05/28/19 0605  FERRITIN 47 45  LDH 223*  --   CRP  --  19.4*    Lab Results  Component Value Date   SARSCOV2NAA NEGATIVE 05/08/2019   [facility] NEGATIVE 04/16/2019   [facility] NEGATIVE 04/08/2019   [facility] NEGATIVE 02/07/2019   Sepsis likely 2/2 COVID 19 infection: fever with hypoxia and AMS. Urine cx with gram negative rods Blood cx pending (aerobic cx from 1/11 with coag negative staph, suspect this is contaminant, follow repeat cx from 1/12) Continue ceftriaxone for UTI above Follow LLE plain films    Acute Metabolic Encephalopathy: likely 2/2 COVID 19 infection, possible contribution from chronic opiate use in setting of AKI. Hold home opiates Delirium precautions TSH, vitamin b12, folate Continue to treat above Follow head CT  Acute Kidney Injury: baseline creatinine <1, 2.81 on presentation.  Improving on 1/12.  Continue to monitor.  Follow UA.  Consider renal US if not continuing to improve.  T2DM: continue lantus at reduced dose, q4 hr SSI  Hypertension: continue amlodipine  OSA on CPAP: pt nonadherent  HFpEF: follow volume status, doesn't appear overloaded  Chronic Pain  Chronic Opiate Use: hold narcotics, on lyrica and trileptal as well in addition to cymbalta - everything currently on hold as he's unable to take PO.  No hx of seizures per discussion with daughter.  Obesity Body  mass index is 37.44 kg/m.  Depression: continue cymbalta  Hx CVA: continue aspirin, plavix  Hx Hardware infection to LLE: ancef was due to be completed on 1/10.  Apparently was refusing some medications. Check plain films  Goals of Care: had discussion with  [facility] due to his AMS.  He's been in and out of [facility] since September of 2020.  He's had short term memory issues for [facility] while as well.  She's aware that recently in rehab he's been refusing therapy, medications, refusing to eat at times.  In previous notes, it was noted that he stated that he was tired and wanted to die.  Discussed my concerns with [facility] that right now Victor [facility] is altered with 6 L oxygen requirement and unable to take PO due to his mental status.  We discussed code status.  [facility] is concerned about his quality of life long term, especially with his recent prolonged stints in rehab and in the [facility].  After discussion, she thinks DNR is most appropriate at this time.  If he declines overnight or seems to be worsening, consider calling daughter to facilitate family visit.    DVT prophylaxis: lovenox Code Status: DNR Family Communication: daughter Disposition Plan: pending further improvement  Consultants:   none  Procedures:   none  Antimicrobials: Anti-infectives (From admission, onward)   Start     Dose/Rate Route Frequency Ordered Stop   05/29/19 1000  remdesivir 100 mg in sodium chloride 0.9 % 100 mL IVPB     100 mg 200 mL/hr over 30 Minutes Intravenous Daily 05/29/19 0505 06/01/19 0959   05/29/19 0600  cefTRIAXone (ROCEPHIN) 1 g in sodium chloride 0.9 % 100 mL IVPB     1 g 200 mL/hr over 30 Minutes Intravenous Every 24 hours 05/29/19 0539       Subjective: Unintelligible speech, mostly  Objective: Vitals:   05/29/19 0400 05/29/19 0838  BP: (!) 151/73 (!) 102/45  Pulse: 72 71  Resp: 20 19  Temp: 99.2 F (37.3 C) (!) 100.7 F (38.2 C)  TempSrc: Oral Axillary  SpO2: 94% 92%  Weight: 111.7 kg   Height: 5\' 8"  (1.727 m)    No intake or output data in the 24 hours ending 05/29/19 1416 Filed Weights   05/29/19 0400  Weight: 111.7 kg    Examination:  General exam: Appears calm, lethargic Respiratory system: Clear to  auscultation. Respiratory effort normal. Cardiovascular system: S1 & S2 heard, RRR.  Gastrointestinal system: Abdomen is nondistended, soft and nontender. Central nervous system: lethargic, disoriented, unintelligible speech. No focal neurological deficits.  Arouses to speech.  No clear movement of extremities. Extremities: LLE in splint Skin: No rashes, lesions or ulcers  Data Reviewed: I have personally reviewed following labs and imaging studies  CBC: Recent Labs  Lab 05/27/19 1347 05/28/19 0605 05/29/19 1245  WBC 7.1 4.9 7.0  NEUTROABS 5.6 3.8 6.1  HGB 10.5* 9.3* 9.6*  HCT 33.8* 30.6* 31.0*  MCV 77.2* 77.5* 77.3*  PLT 281 266 [id number]   Basic Metabolic Panel: Recent Labs  Lab 05/27/19 1347 05/28/19 0605  NA 142 143  K 3.8 4.1  CL 104 108  CO2 23 20*  GLUCOSE 160* 205*  BUN 93* 93*  CREATININE 2.81* 2.49*  CALCIUM 8.7* 8.2*   GFR: Estimated Creatinine Clearance: 34.9 mL/min (Ballard [place]) (by C-G formula based on SCr of 2.49 mg/dL (H)). Liver Function Tests: Recent Labs  Lab 05/27/19 1347 05/28/19 0605  AST 55* 36  ALT 16  13  ALKPHOS 97 77  BILITOT 0.8 1.2  PROT 7.1 6.7  ALBUMIN 2.7* 2.3*   Recent Labs  Lab 05/27/19 1347  LIPASE 22   No results for input(s): AMMONIA in the last 168 hours. Coagulation Profile: Recent Labs  Lab 05/27/19 1347  INR 1.1   Cardiac Enzymes: No results for input(s): CKTOTAL, CKMB, CKMBINDEX, TROPONINI in the last 168 hours. BNP (last 3 results) No results for input(s): PROBNP in the last 8760 hours. HbA1C: No results for input(s): HGBA1C in the last 72 hours. CBG: Recent Labs  Lab 05/27/19 2245 05/28/19 0603 05/28/19 1014 05/29/19 0244 05/29/19 0519  GLUCAP 218* 221* 159* 130* 108*   Lipid Profile: Recent Labs    05/27/19 1347  TRIG 187*   Thyroid Function Tests: No results for input(s): TSH, T4TOTAL, FREET4, T3FREE, THYROIDAB in the last 72 hours. Anemia Panel: Recent Labs    05/27/19 1347 05/28/19 0605  FERRITIN  47 45   Sepsis Labs: Recent Labs  Lab 05/27/19 1347  PROCALCITON 0.14  LATICACIDVEN 1.9    Recent Results (from the past 240 hour(s))  Blood Culture (routine x 2)     Status: Abnormal   Collection Time: 05/27/19  1:47 PM   Specimen: BLOOD  Result Value Ref Range Status   Specimen Description   Final    BLOOD LEFT ANTECUBITAL Performed at [facility], [address], [place], [place] [address]    Special Requests   Final    BOTTLES DRAWN AEROBIC AND ANAEROBIC Blood Culture results may not be optimal due to an excessive volume of blood received in culture bottles Performed at [facility], [address], [place], [place] [address]    Culture  Setup Time   Final    AEROBIC BOTTLE ONLY GRAM POSITIVE COCCI CRITICAL RESULT CALLED TO, READ BACK BY AND VERIFIED WITH: KRISTIN MERRILL AT [place] ON 05/28/19 JJB Performed at [facility], [place] [place], [place] [address]    Culture (Trishna Cwik)  Final    STAPHYLOCOCCUS SPECIES (COAGULASE NEGATIVE) THE SIGNIFICANCE OF ISOLATING THIS ORGANISM FROM [facility] SINGLE SET OF BLOOD CULTURES WHEN MULTIPLE SETS ARE DRAWN IS UNCERTAIN. PLEASE NOTIFY THE MICROBIOLOGY DEPARTMENT WITHIN ONE WEEK IF SPECIATION AND SENSITIVITIES ARE REQUIRED. Performed at [facility], [place] [address] [place], [place] [address]    Report Status 05/29/2019 FINAL  Final  Urine culture     Status: Abnormal (Preliminary result)   Collection Time: 05/27/19  1:47 PM   Specimen: In/Out Cath Urine  Result Value Ref Range Status   Specimen Description   Final    IN/OUT CATH URINE Performed at [facility], [address], [place], [place] [address]    Special Requests   Final    NONE Performed at [facility], [address] [place], [place] [address]    Culture >=100,000 COLONIES/mL GRAM NEGATIVE RODS (Dayla Gasca)  Final   Report Status PENDING  Incomplete  CULTURE, BLOOD (ROUTINE X 2) w Reflex to ID Panel     Status: None  (Preliminary result)   Collection Time: 05/28/19  5:42 PM   Specimen: BLOOD  Result Value Ref Range Status   Specimen Description BLOOD Blood Culture adequate volume  Final   Special Requests   Final    BOTTLES DRAWN AEROBIC AND ANAEROBIC LEFT ANTECUBITAL   Culture   Final    NO GROWTH < 24 HOURS Performed at [facility], [address], [place], [place] [address]  Report Status PENDING  Incomplete  CULTURE, BLOOD (ROUTINE X 2) w Reflex to ID Panel     Status: None (Preliminary result)   Collection Time: 05/28/19  5:42 PM   Specimen: BLOOD  Result Value Ref Range Status   Specimen Description BLOOD Blood Culture adequate volume  Final   Special Requests   Final    BOTTLES DRAWN AEROBIC AND ANAEROBIC BLOOD LEFT HAND   Culture   Final    NO GROWTH < 24 HOURS Performed at [facility], [address] [place], [place] [address]    Report Status PENDING  Incomplete         Radiology Studies: No results found.      Scheduled Meds: . sodium chloride   Intravenous Once  . amLODipine  10 mg Oral Daily  . vitamin C  500 mg Oral Daily  . aspirin EC  81 mg Oral Daily  . Chlorhexidine Gluconate Cloth  6 each Topical Daily  . clopidogrel  75 mg Oral Daily  . dexamethasone  6 mg Oral Q24H  . docusate sodium  100 mg Oral Daily  . DULoxetine  30 mg Oral QPM  . enoxaparin (LOVENOX) injection  55 mg Subcutaneous Q24H  . insulin aspart  0-9 Units Subcutaneous TID WC  . insulin glargine  20 Units Subcutaneous BID  . Ipratropium-Albuterol  1 puff Inhalation Q6H  . oxcarbazepine  600 mg Oral Daily  . oxyCODONE  20 mg Oral Q12H  . pantoprazole  40 mg Oral Daily  . pregabalin  200 mg Oral QHS  . pregabalin  400 mg Oral Daily  . sodium chloride flush  10-40 mL Intracatheter Q12H  . tamsulosin  0.4 mg Oral Daily  . zinc sulfate  220 mg Oral Daily   Continuous Infusions: . cefTRIAXone (ROCEPHIN)  IV 1 g (05/29/19 1304)  . remdesivir 100 mg in NS 100 mL        LOS: 0 days    Time spent: over 30 min    Fayrene Helper, MD [facility] Pager AMION  If 7PM-7AM, please contact night-coverage [email or web address] Password [facility] 05/29/2019, 2:16 PM

## 2019-05-29 NOTE — Progress Notes (Addendum)
Pharmacy Antibiotic Note  [facility] is a 69 y.o. male admitted on 05/29/2019 with COVID-19 pneumonia.  Pharmacy has been consulted for vancomycin and cefepime dosing.  Tmax 101.3 WBC wnl AKI improving, SCr 1.54 CrCl ~54 ml/min  Plan: Vancomycin 2g IV x 1, then 1250mg  IV q24h for estimated AUC 535 Cefepime 2g IV q12h Monitor renal function closely, adjust dose/check levels as needed  Height: 5\' 8"  (172.7 cm) Weight: 246 lb 4.1 oz (111.7 kg) IBW/kg (Calculated) : 68.4  Temp (24hrs), Avg:100.1 F (37.8 C), Min:99 F (37.2 C), Max:101.3 F (38.5 C)  Recent Labs  Lab 05/27/19 1347 05/28/19 0605 05/29/19 1245  WBC 7.1 4.9 7.0  CREATININE 2.81* 2.49* 1.54*  LATICACIDVEN 1.9  --   --     Estimated Creatinine Clearance: 56.4 mL/min (A) (by C-G formula based on SCr of 1.54 mg/dL (H)).    Allergies  Allergen Reactions  . Amoxicillin Other (See Comments)    Has never taken amoxicillin -ENT told him not to take (? Had skin test) Has patient had a PCN reaction causing immediate rash, facial/tongue/throat swelling, SOB or lightheadedness with hypotension: No Has patient had a PCN reaction causing severe rash involving mucus membranes or skin necrosis: No Has patient had a PCN reaction that required hospitalization No Has patient had a PCN reaction occurring within the last 10 years: No If above answers are "NO", then may proceed w/ Cephalo  . Other Anaphylaxis, Itching and Other (See Comments)    Pt states that he is allergic to Endopa.  Reaction:  Anaphylaxis  Pt states that he is allergic to Metabisulfites Reaction:  Itching   . Penicillins Other (See Comments)    Arm turned "blue" at injection site (no treatment needed) Has patient had a PCN reaction causing immediate rash, facial/tongue/throat swelling, SOB or lightheadedness with hypotension: No Has patient had a PCN reaction causing severe rash involving mucus membranes or skin necrosis: No Has patient had a PCN reaction  that required hospitalization No Has patient had a PCN reaction occurring within the last 10 years: No If all of the above answers are "NO", then may proceed with Cephalosporin use.  [name] Hydralazine Other (See Comments)    Reaction:  Cramping of extremities Pt reports tolerating low dose 12.5mg  but no higher.  . Cephalexin Itching    Has tolerated IV cephalosporins  . Clindamycin Itching  . Crestor [Rosuvastatin Calcium] Other (See Comments)    Reaction:  Pt is unable to move arms/legs   . Metoprolol Nausea And Vomiting  . Red Dye Itching  . Statins Other (See Comments)    Reaction:  Pt is unable to move arms/legs  . Sulfa Antibiotics Itching    Other reaction(s): Unknown  . Yellow Dyes (Non-Tartrazine) Itching  . Aspirin Itching and Other (See Comments)    Pt states that he is able to use in lower doses.    . Tape Rash    Please use "paper" tape only.     Antimicrobials this admission: 1/11 Remdesivir >> 1/15 1/11 Cefepime/Flagyl/Vanc x 1 1/13 Ceftriaxone >> 1/13 1/13 Vancomycin >> 1/13 Cefepime >>  Dose adjustments this admission:  Microbiology results: 12/23 COVID+ 1/11 > 100k Enterobacter sp. 1/11 BCx: 1 of 4 bottles CoNS 1/12 BCx: ngtd 1/13 MRSA PCR:   Thank you for allowing pharmacy to be a part of this patient's care.  Peggyann Juba, PharmD, BCPS Pharmacy: [phone] 05/29/2019 5:17 PM

## 2019-05-29 NOTE — Progress Notes (Signed)
Notified daughter of progress.  All questions were answered and this nurse's contact number shared for further communication.  Daughter facetimed with patient.

## 2019-05-29 NOTE — ED Notes (Signed)
EMTALA and Medical Necessity documentation reviewed at this time and found to be complete per policy. 

## 2019-05-29 NOTE — H&P (Signed)
TRH H&P   Patient Demographics:    Victor Wise, is a 69 y.o. male  MRN: [id number]   DOB - [birth year]  Admit Date - 05/29/2019  Outpatient Primary MD for the patient is Lavera Guise, MD  Patient coming from: [facility]  No chief complaint on file.     HPI:    Victor Wise  is a 69 y.o. male, with CHF, BPH, chronic back pain, chronic narcotic use, depression, obesity, HTN, GERD, OSA, recurrent UTIs who presents as a transfer from [facility] for management of respiratory failure with hypoxia secondary to SARS-CoV-2 pneumonia.  The patient resides in the nursing home, was noted to be hypoxic with sats in the 60s and transported to the emergency room.  Supplemental oxygen was initiated to the ED.  In the emergency room SARS-CoV-2 test was positive, CXR revealed bilateral lung infiltrates. Ferritin was 47, LDH 223, procalcitonin 0.14, lactic acid 1.9.  He was given IV dexamethasone, IV cefepime, IV Flagyl, 1L of normal saline and IV remdesivir in the emergency room.  He was transiently on BiPAP, he has been refusing to take his medications stating he wanted, palliative care was consulted, unfortunately they are unable to reach the daughter ([name] [phone]) who is POA.  Of note patient was on the hospitalist service at [facility] 11/25-12/12/2018 undergoing treatment for infected left lower extremity hardware, intraoperative cultures grew MSSA and was discharged on Ancef which was due to be completed 05/26/2019.   Review of systems:  Review of Systems: Unable to assess  With Past History of the following :   Past Medical History:  Diagnosis Date  . Allergy   . Basal cell carcinoma    forehead  . BPH (benign prostatic  hyperplasia)   . CHF (congestive heart failure) ([place])   . Chronic back pain   . Coronary artery disease   . Depression   . Diabetes ([place])   . GERD (gastroesophageal reflux disease)   . Heart attack ([place])    2018  . Hypertension   . Morbid obesity ([place])   . Obstructive sleep apnea   . 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 Status post insertion of spinal cord stimulator   . Stroke [facility])    last one 2-3 years ago  . UTI (lower urinary tract infection)    Past  Surgical History:  Procedure Laterality Date  . AMPUTATION TOE Left 04/19/2018   Procedure: AMPUTATION TOE-LEFT GREAT TOE;  Surgeon: Sharlotte Alamo, DPM;  Location: [facility] ORS;  Service: Podiatry;  Laterality: Left;  . APPLICATION OF A-CELL OF EXTREMITY Left 04/16/2019   Procedure: APPLICATION OF A-CELL OF EXTREMITY;  Surgeon: Thornton Park, MD;  Location: [facility] OR[facility];  Service: Orthopedics;  Laterality: Left;  . APPLICATION OF WOUND VAC Left 04/16/2019   Procedure: APPLICATION OF WOUND VAC;  Surgeon: Thornton Park, MD;  Location: [facility] OR[facility];  Service: Orthopedics;  Laterality: Left;  . APPLICATION OF WOUND VAC Left 04/12/2019   Procedure: APPLICATION OF WOUND VAC;  Surgeon: Thornton Park, MD;  Location: [facility];  Service: Orthopedics;  Laterality: Left;  . CARDIAC CATHETERIZATION N/A 12/19/2014   Procedure: Coronary Stent Intervention;  Surgeon: Charolette Forward, MD;  Location: [place] [facility];  Service: Cardiovascular;  Laterality: N/A;  . CARDIAC CATHETERIZATION Left 12/19/2014   Procedure: Left Heart Cath and Coronary Angiography;  Surgeon: Dionisio Darroll, MD;  Location: [place] [facility];  Service: Cardiovascular;  Laterality: Left;  . HARDWARE REMOVAL Left 04/12/2019   Procedure: HARDWARE REMOVAL;  Surgeon: Thornton Park, MD;  Location: [facility];  Service: Orthopedics;  Laterality: Left;  . INCISION AND DRAINAGE OF WOUND Left 04/12/2019   Procedure: IRRIGATION AND DEBRIDEMENT WOUND, WOUND VAC EXCHANGE;  Surgeon: Thornton Park,  MD;  Location: [facility];  Service: Orthopedics;  Laterality: Left;  . INCISION AND DRAINAGE OF WOUND Left 04/12/2019   Procedure: IRRIGATION AND DEBRIDEMENT WOUND;  Surgeon: Thornton Park, MD;  Location: [facility];  Service: Orthopedics;  Laterality: Left;  . IRRIGATION AND DEBRIDEMENT FOOT Left 03/02/2018   Procedure: IRRIGATION AND DEBRIDEMENT FOOT;  Surgeon: Samara Deist, DPM;  Location: [facility];  Service: Podiatry;  Laterality: Left;  . IRRIGATION AND DEBRIDEMENT FOOT Left 03/08/2018   Procedure: IRRIGATION AND DEBRIDEMENT FOOT AND BONE;  Surgeon: Sharlotte Alamo, DPM;  Location: [facility];  Service: Podiatry;  Laterality: Left;  . IRRIGATION AND DEBRIDEMENT FOOT Left 04/19/2018   Procedure: IRRIGATION AND DEBRIDEMENT FOOT;  Surgeon: Sharlotte Alamo, DPM;  Location: [facility];  Service: Podiatry;  Laterality: Left;  . LOWER EXTREMITY ANGIOGRAPHY Left 03/05/2018   Procedure: Lower Extremity Angiography;  Surgeon: Algernon Huxley, MD;  Location: [place] [facility];  Service: Cardiovascular;  Laterality: Left;  . LOWER EXTREMITY ANGIOGRAPHY Left 03/08/2018   Procedure: LOWER EXTREMITY ANGIOGRAPHY;  Surgeon: Algernon Huxley, MD;  Location: [place] [facility];  Service: Cardiovascular;  Laterality: Left;  . ORIF ANKLE FRACTURE Left 01/31/2019   Procedure: open reduction internal fixation left bimalleolar ankle fracture;  Surgeon: Thornton Park, MD;  Location: [facility];  Service: Orthopedics;  Laterality: Left;  . PACEMAKER INSERTION Left 11/02/2015   Procedure: INSERTION PACEMAKER;  Surgeon: Isaias Cowman, MD;  Location: [facility];  Service: Cardiovascular;  Laterality: Left;  . Pain Stimulator    . Right toe amputation      Social History:    Social History   Tobacco Use  . Smoking status: Former Smoker    Types: Cigarettes  . Smokeless tobacco: Never Used  . Tobacco comment: quit 45 years  Substance Use Topics  . Alcohol use: Not Currently    Alcohol/week: 0.0 standard drinks     Comment: have not had alcohol in 89yrs    Family History :    Family History  Problem Relation Age of Onset  . Breast cancer Mother   . Cancer Mother   .  Hypertension Mother   . Lung cancer Father   . Hypertension Father   . Heart disease Father   . Cancer Father   . Kidney disease Sister   . Prostate cancer Neg Hx     Home Medications:   Prior to Admission medications   Medication Sig Start Date End Date Taking? Authorizing Provider  acetaminophen (TYLENOL) 325 MG tablet Take 2 tablets (650 mg total) by mouth every 6 (six) hours as needed for mild pain or headache (fever >/= 101). 05/28/19   Nolberto Hanlon, MD  amLODipine (NORVASC) 10 MG tablet TAKE 1 TABLET BY MOUTH ONCE DAILY Patient taking differently: Take 10 mg by mouth daily.  12/03/18   Kendell Bane, NP  aspirin EC 81 MG tablet Take 81 mg by mouth daily.    [provider]  clopidogrel (PLAVIX) 75 MG tablet Take 1 tablet (75 mg total) by mouth daily. 06/28/18   Ronnell Freshwater, NP  dexamethasone (DECADRON) 10 MG/ML injection Inject 0.6 mLs (6 mg total) into the vein daily. 05/29/19   Nolberto Hanlon, MD  docusate sodium (COLACE) 100 MG capsule Take 100 mg by mouth 2 (two) times daily.    [provider]  DULoxetine (CYMBALTA) 30 MG capsule Take 1 capsule (30 mg total) by mouth every evening. 05/28/19   Nolberto Hanlon, MD  ezetimibe (ZETIA) 10 MG tablet Take 10 mg by mouth at bedtime.  10/29/18   [provider]  gemfibrozil (LOPID) 600 MG tablet Take 600 mg by mouth 2 (two) times daily.    [provider]  heparin 5000 UNIT/ML injection Inject 1 mL (5,000 Units total) into the skin every 8 (eight) hours. 05/28/19   Nolberto Hanlon, MD  insulin glargine (LANTUS) 100 UNIT/ML injection Inject 24 Units into the skin 2 (two) times daily.    [provider]  insulin lispro (HUMALOG) 100 UNIT/ML injection Inject 0-12 Units into the skin See admin instructions. Inject 3 times daily before meals  according to sliding scale:  0-200: 0u 201-250: 2u 251-300: 4u 301-350: 6u 351-400: 8u 401-450: 10u 451+ 12u and contact physician    [provider]  lactulose (CHRONULAC) 10 GM/15ML solution Take 30 mLs (20 g total) by mouth daily as needed for mild constipation. 05/28/19   Nolberto Hanlon, MD  linaclotide (LINZESS) 290 MCG CAPS capsule Take 290 mcg by mouth daily before breakfast.     [provider]  Multiple Vitamin (MULTIVITAMIN WITH MINERALS) TABS tablet Take 1 tablet by mouth daily.    [provider]  NARCAN 4 MG/0.1ML LIQD Place 4 mg into the nose as needed (for opioid overdose).     [provider]  omega-3 acid ethyl esters (LOVAZA) 1 g capsule Take 1 capsule (1 g total) by mouth 2 (two) times daily. 01/24/19   Scarboro, Audie Clear, NP  ondansetron (ZOFRAN) 4 MG tablet Take 1 tablet (4 mg total) by mouth every 6 (six) hours as needed for nausea. 05/28/19   Nolberto Hanlon, MD  ondansetron (ZOFRAN) 4 MG/2ML SOLN injection Inject 2 mLs (4 mg total) into the vein every 6 (six) hours as needed for nausea. 05/28/19   Nolberto Hanlon, MD  oxcarbazepine (TRILEPTAL) 600 MG tablet Take 600 mg by mouth daily.     [provider]  oxyCODONE (OXY IR/ROXICODONE) 5 MG immediate release tablet Take 5 mg by mouth every 6 (six) hours as needed for moderate pain or severe pain.    [provider]  oxyCODONE Alesia Morin)  20 mg 12 hr tablet Take 1 tablet (20 mg total) by mouth every 12 (twelve) hours. 04/22/19   Thornton Park, MD  pantoprazole (PROTONIX) 40 MG tablet Take 1 tablet (40 mg total) by mouth daily. 05/29/19   Nolberto Hanlon, MD  polyethylene glycol (MIRALAX / GLYCOLAX) 17 g packet Take 17 g by mouth 2 (two) times daily. 05/28/19   Nolberto Hanlon, MD  pregabalin (LYRICA) 200 MG capsule Take 200 mg by mouth at bedtime.     [provider]  pregabalin (LYRICA) 200 MG capsule Take 400 mg by mouth daily.    [provider]  Probiotic Product  (ALIGN) 4 MG CAPS Take 4 mg by mouth daily.    [provider]  tamsulosin (FLOMAX) 0.4 MG CAPS capsule TAKE 1 CAPSULE BY MOUTH ONCE DAILY AFTERSUPPER Patient taking differently: Take 0.4 mg by mouth daily.  02/01/19   Kendell Bane, NP  vitamin C (ASCORBIC ACID) 250 MG tablet Take 500 mg by mouth 2 (two) times daily.    [provider]  zinc sulfate 220 (50 Zn) MG capsule Take 220 mg by mouth daily.    [provider]    Allergies:    Allergies  Allergen Reactions  . Amoxicillin Other (See Comments)    Has never taken amoxicillin -ENT told him not to take (? Had skin test) Has patient had a PCN reaction causing immediate rash, facial/tongue/throat swelling, SOB or lightheadedness with hypotension: No Has patient had a PCN reaction causing severe rash involving mucus membranes or skin necrosis: No Has patient had a PCN reaction that required hospitalization No Has patient had a PCN reaction occurring within the last 10 years: No If above answers are "NO", then may proceed w/ Cephalo  . Other Anaphylaxis, Itching and Other (See Comments)    Pt states that he is allergic to Endopa.  Reaction:  Anaphylaxis  Pt states that he is allergic to Metabisulfites Reaction:  Itching   . Penicillins Other (See Comments)    Arm turned "blue" at injection site (no treatment needed) Has patient had a PCN reaction causing immediate rash, facial/tongue/throat swelling, SOB or lightheadedness with hypotension: No Has patient had a PCN reaction causing severe rash involving mucus membranes or skin necrosis: No Has patient had a PCN reaction that required hospitalization No Has patient had a PCN reaction occurring within the last 10 years: No If all of the above answers are "NO", then may proceed with Cephalosporin use.  [name] Hydralazine Other (See Comments)    Reaction:  Cramping of extremities Pt reports tolerating low dose 12.5mg  but no higher.  . Cephalexin Itching  .  Clindamycin Itching  . Crestor [Rosuvastatin Calcium] Other (See Comments)    Reaction:  Pt is unable to move arms/legs   . Metoprolol Nausea And Vomiting  . Red Dye Itching  . Statins Other (See Comments)    Reaction:  Pt is unable to move arms/legs  . Sulfa Antibiotics Itching    Other reaction(s): Unknown  . Yellow Dyes (Non-Tartrazine) Itching  . Aspirin Itching and Other (See Comments)    Pt states that he is able to use in lower doses.    . Tape Rash    Please use "paper" tape only.     Physical Exam:   Vitals  Blood pressure (!) 151/73, pulse 72, temperature 99.2 F (37.3 C), temperature source Oral, resp. rate 20, height 5\' 8"  (1.727 m), weight 111.7 kg, SpO2 94 %.  Physical Exam  Constitutional - resting comfortably, no acute distress Eyes - pupils equal round and reactive to light and accomodation, extra ocular movements intact Nose - no gross deformity or drainage Mouth - no oral lesions noted Throat - no swelling or erythema Neck - supple, no JVD   CV - (+)S1S2, no murmurs  Resp - CTA bilaterally, no wheezing or crackles,  GI - (+)BS, soft, non-tender, non-distended Extrem - no clubbing, cyanosis, or peripheral edema  Skin - no rashes or wounds Neuro - alert, aware, oriented to person/place/time  Psych - normal affect, no anxiety   Patient has Pressure Ulcer on Admission?: no   Data Review:    CBC Recent Labs  Lab 05/27/19 1347 05/28/19 0605  WBC 7.1 4.9  HGB 10.5* 9.3*  HCT 33.8* 30.6*  PLT 281 266  MCV 77.2* 77.5*  MCH 24.0* 23.5*  MCHC 31.1 30.4  RDW 20.9* 21.1*  LYMPHSABS 1.0 0.6*  MONOABS 0.3 0.3  EOSABS 0.1 0.0  BASOSABS 0.0 0.0   ------------------------------------------------------------------------------------------------------------------  Chemistries  Recent Labs  Lab 05/27/19 1347 05/28/19 0605  NA 142 143  K 3.8 4.1  CL 104 108  CO2 23 20*  GLUCOSE 160* 205*  BUN 93* 93*  CREATININE 2.81* 2.49*  CALCIUM 8.7* 8.2*   AST 55* 36  ALT 16 13  ALKPHOS 97 77  BILITOT 0.8 1.2   ------------------------------------------------------------------------------------------------------------------ estimated creatinine clearance is 34.9 mL/min (A) (by C-G formula based on SCr of 2.49 mg/dL (H)). ------------------------------------------------------------------------------------------------------------------ No results for input(s): TSH, T4TOTAL, T3FREE, THYROIDAB in the last 72 hours.  Invalid input(s): FREET3  Coagulation profile Recent Labs  Lab 05/27/19 1347  INR 1.1   ------------------------------------------------------------------------------------------------------------------- No results for input(s): DDIMER in the last 72 hours. -------------------------------------------------------------------------------------------------------------------  Cardiac Enzymes No results for input(s): CKMB, TROPONINI, MYOGLOBIN in the last 168 hours.  Invalid input(s): CK ------------------------------------------------------------------------------------------------------------------    Component Value Date/Time   BNP 19.0 10/26/2015 1533     ---------------------------------------------------------------------------------------------------------------  Urinalysis    Component Value Date/Time   COLORURINE YELLOW 04/24/2019 0454   APPEARANCEUR CLEAR 04/24/2019 0454   APPEARANCEUR Cloudy (A) 01/28/2019 0959   LABSPEC 1.014 04/24/2019 0454   LABSPEC 1.012 02/06/2014 2120   PHURINE 7.0 04/24/2019 [place] 04/24/2019 0454   GLUCOSEU Negative 02/06/2014 2120   HGBUR NEGATIVE 04/24/2019 0454   BILIRUBINUR NEGATIVE 04/24/2019 0454   BILIRUBINUR Negative 01/28/2019 0959   BILIRUBINUR Negative 02/06/2014 2120   KETONESUR NEGATIVE 04/24/2019 0454   PROTEINUR NEGATIVE 04/24/2019 0454   NITRITE NEGATIVE 04/24/2019 0454   LEUKOCYTESUR NEGATIVE 04/24/2019 0454   LEUKOCYTESUR Negative  02/06/2014 2120    ----------------------------------------------------------------------------------------------------------------   Imaging Results:    DG Chest Port 1 View  Result Date: 05/27/2019 CLINICAL DATA:  Shortness of breath. EXAM: PORTABLE CHEST 1 VIEW COMPARISON:  February 18, 2016. FINDINGS: Stable cardiomediastinal silhouette. No pneumothorax is noted. Right-sided PICC line is noted with tip in expected position of the SVC. No pneumothorax is noted. Central pulmonary vascular congestion is noted. Mild ill-defined bilateral lung opacities are noted which may represent pneumonia. Small left pleural effusion is noted. Bony thorax is unremarkable. IMPRESSION: Right-sided PICC line is noted with tip in expected position of the SVC. Central pulmonary vascular congestion is noted. Mild ill-defined bilateral lung opacities are noted concerning for pneumonia. Small left pleural effusion is noted. Electronically Signed   By: Marijo Conception M.D.   On: 05/27/2019 14:06    Assessment & Plan:    Principal Problem:   Acute  hypoxemic respiratory failure due to severe acute respiratory syndrome coronavirus 2 (SARS-CoV-2) disease (HCC) Active Problems:   Uncontrolled type 2 diabetes mellitus with hyperglycemia (HCC)   Essential hypertension   OSA on CPAP   Chronic diastolic CHF (congestive heart failure), NYHA class 2 (HCC)   Chronic, continuous use of opioids   Obesity, Class III, BMI 40-49.9 (morbid obesity) (HCC)   Pain syndrome, chronic   Urinary tract infection   Moderate episode of recurrent major depressive disorder ([place])     Acute hypoxemic respiratory failure due to SARS-CoV-2 disease: Presents as a transfer from [facility] where he presented 1/11 with hypoxia, sats in the 60s on room air, tested positive for SARS, CXR consistent with viral pneumonia, transiently on BiPAP now on 6 L.  Patient has been refusing treatment, palliative care consulted however could not reach his  daughter/POA. Date of Dx: 1/11 Oxygen requirements: 6 LPM Antibiotics: Not indicated, WBC and procalcitonin WNL Diuretics: Patient has been refusing to eat, is AKI, currently receiving fluids Vitamin C and Zinc: Per protocol Remdesivir: Started on 1/11 Steroids: Started on 1/11 Actemra: Not given yet Convalescent Plasma: Not given yet    AKI: Patient baseline 0.8, present creatinine of 2.8, likely prerenal from patient refusing to partake in oral intake vs UTI.  Continue IV fluids.  Obtain urine electrolytes.  Avoid nephrotoxic agents.    UTI: Start patient on Rocephin.  Follow-up urine culture    Uncontrolled type 2 diabetes mellitus with hyperglycemia: Diabetic.  Fingersticks before meals and at bedtime.  Sliding scale insulin.    Essential hypertension: Blood pressure is above goal.  Continue amlodipine.  Any ACEi/ARB or diuretics.    OSA on CPAP: Patient has been noncompliant    Chronic diastolic CHF (congestive heart failure), NYHA class 2: Clinically dehydrated.  On IV fluids.  Monitor volume status closely.    Chronic, continuous use of opioids/Pain syndrome, chronic: We will continue his chronic narcotics.    Obesity, Class III, BMI 40-49.9 (morbid obesity) (HCC):Obesity affects all facets of care.  Likely secondary to excessive caloric intake.  Encourage patient to reduce caloric intake while increasing physical activity and engage in other lifestyle changes that may result in weight loss.      Moderate episode of recurrent major depressive disorder: Patient has been refusing home medications stating he was tired and wanted to die.  Remains full code until discussion is held with family.  Continue Cymbalta, Trileptal, Lyrica   DVT Prophylaxis Heparin  AM Labs Ordered, also please review Full Orders  Code Status full code  Likely DC to nursing home  Condition GUARDED    Consults called: None  Admission status: Admit to inpatient  Time spent in minutes :  36   Peyton Bottoms M.D on 05/29/2019 at 5:01 AM  To page go to [email or web address] - password [facility]

## 2019-05-30 ENCOUNTER — Inpatient Hospital Stay (HOSPITAL_COMMUNITY): Payer: Medicare HMO

## 2019-05-30 LAB — CBC WITH DIFFERENTIAL/PLATELET
Abs Immature Granulocytes: 0.26 10*3/uL — ABNORMAL HIGH (ref 0.00–0.07)
Basophils Absolute: 0 10*3/uL (ref 0.0–0.1)
Basophils Relative: 0 %
Eosinophils Absolute: 0 10*3/uL (ref 0.0–0.5)
Eosinophils Relative: 0 %
HCT: 29.9 % — ABNORMAL LOW (ref 39.0–52.0)
Hemoglobin: 9.2 g/dL — ABNORMAL LOW (ref 13.0–17.0)
Immature Granulocytes: 4 %
Lymphocytes Relative: 7 %
Lymphs Abs: 0.4 10*3/uL — ABNORMAL LOW (ref 0.7–4.0)
MCH: 23.9 pg — ABNORMAL LOW (ref 26.0–34.0)
MCHC: 30.8 g/dL (ref 30.0–36.0)
MCV: 77.7 fL — ABNORMAL LOW (ref 80.0–100.0)
Monocytes Absolute: 0.1 10*3/uL (ref 0.1–1.0)
Monocytes Relative: 2 %
Neutro Abs: 5.3 10*3/uL (ref 1.7–7.7)
Neutrophils Relative %: 87 %
Platelets: 255 10*3/uL (ref 150–400)
RBC: 3.85 MIL/uL — ABNORMAL LOW (ref 4.22–5.81)
RDW: 21.2 % — ABNORMAL HIGH (ref 11.5–15.5)
WBC: 6.1 10*3/uL (ref 4.0–10.5)
nRBC: 0 % (ref 0.0–0.2)

## 2019-05-30 LAB — BPAM FFP
Blood Product Expiration Date: 202101141143
ISSUE DATE / TIME: 202101131445
Unit Type and Rh: 5100

## 2019-05-30 LAB — COMPREHENSIVE METABOLIC PANEL
ALT: 11 U/L (ref 0–44)
AST: 26 U/L (ref 15–41)
Albumin: 2.3 g/dL — ABNORMAL LOW (ref 3.5–5.0)
Alkaline Phosphatase: 69 U/L (ref 38–126)
Anion gap: 14 (ref 5–15)
BUN: 87 mg/dL — ABNORMAL HIGH (ref 8–23)
CO2: 20 mmol/L — ABNORMAL LOW (ref 22–32)
Calcium: 8.6 mg/dL — ABNORMAL LOW (ref 8.9–10.3)
Chloride: 116 mmol/L — ABNORMAL HIGH (ref 98–111)
Creatinine, Ser: 1.5 mg/dL — ABNORMAL HIGH (ref 0.61–1.24)
GFR calc Af Amer: 55 mL/min — ABNORMAL LOW (ref >60)
GFR calc non Af Amer: 47 mL/min — ABNORMAL LOW (ref >60)
Glucose, Bld: 220 mg/dL — ABNORMAL HIGH (ref 70–99)
Potassium: 3.4 mmol/L — ABNORMAL LOW (ref 3.5–5.1)
Sodium: 150 mmol/L — ABNORMAL HIGH (ref 135–145)
Total Bilirubin: 1.1 mg/dL (ref 0.3–1.2)
Total Protein: 6.8 g/dL (ref 6.5–8.1)

## 2019-05-30 LAB — C-REACTIVE PROTEIN: CRP: 25.3 mg/dL — ABNORMAL HIGH (ref <1.0)

## 2019-05-30 LAB — GLUCOSE, CAPILLARY
Glucose-Capillary: 181 mg/dL — ABNORMAL HIGH (ref 70–99)
Glucose-Capillary: 233 mg/dL — ABNORMAL HIGH (ref 70–99)
Glucose-Capillary: 245 mg/dL — ABNORMAL HIGH (ref 70–99)
Glucose-Capillary: 247 mg/dL — ABNORMAL HIGH (ref 70–99)
Glucose-Capillary: 308 mg/dL — ABNORMAL HIGH (ref 70–99)

## 2019-05-30 LAB — CARBAPENEM RESISTANCE PANEL
Carba Resistance IMP Gene: NOT DETECTED
Carba Resistance KPC Gene: DETECTED — AB
Carba Resistance NDM Gene: NOT DETECTED
Carba Resistance OXA48 Gene: NOT DETECTED
Carba Resistance VIM Gene: NOT DETECTED

## 2019-05-30 LAB — URINE CULTURE: Culture: >=100000 — AB

## 2019-05-30 LAB — PREPARE FRESH FROZEN PLASMA

## 2019-05-30 LAB — D-DIMER, QUANTITATIVE: D-Dimer, Quant: 1.3 ug/mL-FEU — ABNORMAL HIGH (ref 0.00–0.50)

## 2019-05-30 LAB — T4, FREE: Free T4: 1.87 ng/dL — ABNORMAL HIGH (ref 0.61–1.12)

## 2019-05-30 LAB — FERRITIN: Ferritin: 75 ng/mL (ref 24–336)

## 2019-05-30 MED ORDER — MORPHINE SULFATE (PF) 2 MG/ML IV SOLN: 1.0000 mg | INTRAVENOUS | Status: DC | PRN

## 2019-05-30 MED ORDER — DEXTROSE 5 % IV SOLN: INTRAVENOUS | Status: DC

## 2019-05-30 MED ORDER — BIOTENE DRY MOUTH MT LIQD: 15.0000 mL | OROMUCOSAL | Status: DC | PRN

## 2019-05-30 MED ORDER — HALOPERIDOL LACTATE 2 MG/ML PO CONC
0.5000 mg | ORAL | Status: DC | PRN
  Filled 2019-05-30: qty 0.3

## 2019-05-30 MED ORDER — ONDANSETRON HCL 4 MG/2ML IJ SOLN: 4.0000 mg | Freq: Four times a day (QID) | INTRAMUSCULAR | Status: DC | PRN

## 2019-05-30 MED ORDER — ACETAMINOPHEN 325 MG PO TABS: 650.0000 mg | ORAL_TABLET | Freq: Four times a day (QID) | ORAL | Status: DC | PRN

## 2019-05-30 MED ORDER — ONDANSETRON 4 MG PO TBDP: 4.0000 mg | ORAL_TABLET | Freq: Four times a day (QID) | ORAL | Status: DC | PRN

## 2019-05-30 MED ORDER — ACETAMINOPHEN 650 MG RE SUPP
650.0000 mg | Freq: Four times a day (QID) | RECTAL | Status: DC | PRN
  Administered 2019-05-30: 01:00:00 650 mg via RECTAL
  Filled 2019-05-30: qty 1

## 2019-05-30 MED ORDER — GLYCOPYRROLATE 0.2 MG/ML IJ SOLN
0.2000 mg | INTRAMUSCULAR | Status: DC | PRN
  Administered 2019-05-30: 15:00:00 0.2 mg via INTRAVENOUS
  Filled 2019-05-30: qty 1

## 2019-05-30 MED ORDER — GLYCOPYRROLATE 1 MG PO TABS
1.0000 mg | ORAL_TABLET | ORAL | Status: DC | PRN
  Filled 2019-05-30: qty 1

## 2019-05-30 MED ORDER — GLYCOPYRROLATE 0.2 MG/ML IJ SOLN: 0.2000 mg | INTRAMUSCULAR | Status: DC | PRN

## 2019-05-30 MED ORDER — ACETAMINOPHEN 650 MG RE SUPP: 650.0000 mg | Freq: Four times a day (QID) | RECTAL | Status: DC | PRN

## 2019-05-30 MED ORDER — POLYVINYL ALCOHOL 1.4 % OP SOLN
1.0000 [drp] | Freq: Four times a day (QID) | OPHTHALMIC | Status: DC | PRN
  Filled 2019-05-30: qty 15

## 2019-05-30 MED ORDER — HALOPERIDOL LACTATE 5 MG/ML IJ SOLN: 0.5000 mg | INTRAMUSCULAR | Status: DC | PRN

## 2019-05-30 MED ORDER — HALOPERIDOL 0.5 MG PO TABS
0.5000 mg | ORAL_TABLET | ORAL | Status: DC | PRN
  Filled 2019-05-30: qty 1

## 2019-05-30 NOTE — Progress Notes (Addendum)
Family visit today with daughter.  Discussed her fathers waxing/waning mental status, continued lethargy, inability to take PO safely with concern for aspiration at this time.  Also discussed his recent rehab stay and pattern of declining treatment and therapy.  Discussed options including continuing current plan of care vs transition to comfort/hospice care.  [name] appreciative of visit, hopeful to have her father see her daughters cheer via facetime tonight.  She notes he brightened up [place] bit during her visit and when seeing the grandchildren, but noted his lethargy (falling asleep intermittently during their discussion) and upper airway sounds concerning for possible ongoing aspiration.  Based on our discussion, will transition to comfort measures and plan for inpatient hospice if he's stable for transfer.  Suspect prognosis is days, less than 2 weeks based on todays evaluation.

## 2019-05-30 NOTE — Progress Notes (Signed)
Facetimed with patient's daughter [name]) and her two daughters.  Family visit arranged for [name] to come at 12:45 today.  Agricultural consultant notified.

## 2019-05-30 NOTE — Progress Notes (Signed)
Upon bedside report and AM rounds, patient minimally responsive.  Opens eyes to stimulation but quickly drifts back to sleep, intermittently following some commands, lethargic.  Physician notified.  Physician states attempts will be made to contact patient's daughter.

## 2019-05-30 NOTE — Progress Notes (Addendum)
PROGRESS NOTE    Victor Wise  [id number] DOB: 1951-05-07 DOA: 05/29/2019 PCP: Lavera Guise, MD   Brief Narrative:  Victor Wise  is Victor Wise 69 y.o. male, with CHF, BPH, chronic back pain, chronic narcotic use, depression, obesity, HTN, GERD, OSA, recurrent UTIs who presents as Victor Wise transfer from [facility] for management of respiratory failure with hypoxia secondary to SARS-CoV-2 pneumonia.  The patient resides in the nursing home, was noted to be hypoxic with sats in the 60s and transported to the emergency room.  Supplemental oxygen was initiated to the ED.  In the emergency room SARS-CoV-2 test was positive, CXR revealed bilateral lung infiltrates. Ferritin was 47, LDH 223, procalcitonin 0.14, lactic acid 1.9. He was given IV dexamethasone, IV cefepime, IV Flagyl,1Lof normal salineand IV remdesivir in the emergency room.  He was transiently on BiPAP, he has been refusing to take his medications stating he wanted, palliative care was consulted, unfortunately they are unable to reach the daughter ([name] [id number] is POA.  Of note patient was on the hospitalist service at [facility] [place] 11/25-12/12/2018 undergoing treatment for infected left lower extremity hardware, intraoperative cultures grew MSSA and was discharged on Ancef which was due to be completed 05/26/2019.  Assessment & Plan:   Principal Problem:   Acute hypoxemic respiratory failure due to severe acute respiratory syndrome coronavirus 2 (SARS-CoV-2) disease (HCC) Active Problems:   Uncontrolled type 2 diabetes mellitus with hyperglycemia (HCC)   Essential hypertension   OSA on CPAP   Chronic diastolic CHF (congestive heart failure), NYHA class 2 (HCC)   Chronic, continuous use of opioids   Obesity, Class III, BMI 40-49.9 (morbid obesity) (HCC)   Pain syndrome, chronic   Urinary tract infection   Moderate episode of recurrent major depressive disorder ([place])  Goals of Care: had discussion with [name] due to his  AMS.  He's been in and out of hospital since September of 2020.  He's had short term memory issues for [name] while as well.  She's aware that recently in rehab he's been refusing therapy, medications, refusing to eat at times.  In previous notes, it was noted that he stated that he was tired and wanted to die.  Discussed my concerns with [name] that right now [name] is altered with 6 L oxygen requirement and unable to take PO due to his mental status.  We discussed code status.  Mrs. Ritta Slot is concerned about his quality of life long term, especially with his recent prolonged stints in rehab and in the hospital.  After discussion, she thinks DNR is most appropriate at this time.  Today he continues to wax and wane.  He followed commands for me (intermittently today, not at all yesterday) and was spontaneously moving his upper extremities, trying to pull out of bed (yesterday did not even withdraw extremities to painful stimuli).  Addendum: On reassessment he's been lethargic, not consistently following commands (discussed with nursing).  Considered CT scan of LLE, but wound does not look grossly infected and based on his decline I don't think it would meaningfully change management.  Discussed with daughter and recommended she come for Victor Wise family visit.    Acute Hypoxic Respiratory Failure Secondary to COVID 19 Pneumonia:  Requiring 6 L satting in high 80's and low 90's Continue steroids, remdesivir Discussed convalescent plasma, daughter agreeable after going over consent form I/O, daily weights IS, prone as able, OOB Daily inflammatory labs - pending labs today LE [place] (no DVT)  COVID-19 Labs  Recent Labs    05/27/19 1347 05/27/19 1347 05/28/19 0605 05/29/19 1245 05/30/19 0239  DDIMER  --   --   --  1.16* 1.30*  FERRITIN 47   < > 45 55  52 75  LDH 223*  --   --   --   --   CRP  --   --  19.4* 16.9*  16.6* 25.3*   < > = values in this interval not displayed.    Lab Results   Component Value Date   SARSCOV2NAA NEGATIVE 05/08/2019   [place] NEGATIVE 04/16/2019   [place] NEGATIVE 04/08/2019   [place] NEGATIVE 02/07/2019   Sepsis likely 2/2 COVID 19 infection: fever with hypoxia and AMS. Urine cx with gram negative rods (enterobacter) Blood cx pending (aerobic cx from 1/11 with coag negative staph, suspect this is contaminant, follow repeat cx from 1/12) Continue ceftriaxone for UTI above Follow LLE plain films (no acute fx or dislocation)  Acute Metabolic Encephalopathy: likely 2/2 COVID 19 infection, possible contribution from chronic opiate use in setting of AKI. Hold home opiates Delirium precautions TSH (follow free T4), vitamin b12, folate Continue to treat above Follow head CT (with chronic infarct) Waxing and waning, as above pt followed some commands this morning, but is still very lethargic  Acute Kidney Injury: baseline creatinine <1, 2.81 on presentation.  Improving on 1/12.  Continue to monitor.  Follow UA.  Consider renal US if not continuing to improve.  T2DM: continue lantus at reduced dose, q4 hr SSI  Hypertension: continue amlodipine  OSA on CPAP: pt nonadherent  HFpEF: follow volume status, doesn't appear overloaded  Chronic Pain  Chronic Opiate Use: hold narcotics, on lyrica and trileptal as well in addition to cymbalta - everything currently on hold as he's unable to take PO.  No hx of seizures per discussion with daughter.  Obesity Body mass index is 37.44 kg/m.  Depression: continue cymbalta  Hx CVA: continue aspirin, plavix  Hx Hardware infection to LLE: ancef was due to be completed on 1/10.  Apparently was refusing some medications. Check plain films as above  Unstageable decubitus to L heel Wound care  LLE Surgical Wound: Does not appear infected  DVT prophylaxis: lovenox Code Status: DNR Family Communication: daughter Disposition Plan: pending further improvement  Consultants:    none  Procedures:   none  Antimicrobials: Anti-infectives (From admission, onward)   Start     Dose/Rate Route Frequency Ordered Stop   05/30/19 2200  vancomycin (VANCOREADY) IVPB 1250 mg/250 mL     1,250 mg 166.7 mL/hr over 90 Minutes Intravenous Every 24 hours 05/29/19 1717     05/30/19 0600  ceFEPIme (MAXIPIME) 2 g in sodium chloride 0.9 % 100 mL IVPB     2 g 200 mL/hr over 30 Minutes Intravenous Every 12 hours 05/29/19 1858     05/29/19 1830  ceFEPIme (MAXIPIME) 2 g in sodium chloride 0.9 % 100 mL IVPB  Status:  Discontinued     2 g 200 mL/hr over 30 Minutes Intravenous Every 12 hours 05/29/19 1809 05/29/19 1858   05/29/19 1800  vancomycin (VANCOREADY) IVPB 2000 mg/400 mL     2,000 mg 200 mL/hr over 120 Minutes Intravenous  Once 05/29/19 1716 05/29/19 2213   05/29/19 1000  remdesivir 100 mg in sodium chloride 0.9 % 100 mL IVPB     100 mg 200 mL/hr over 30 Minutes Intravenous Daily 05/29/19 0505 06/01/19 0959   05/29/19 0600  cefTRIAXone (ROCEPHIN) 1 g in sodium chloride  0.9 % 100 mL IVPB  Status:  Discontinued     1 g 200 mL/hr over 30 Minutes Intravenous Every 24 hours 05/29/19 0539 05/29/19 1758     Subjective: Unintelligible speech  Objective: Vitals:   05/29/19 2010 05/30/19 0000 05/30/19 0405 05/30/19 0734  BP: 131/65 136/64 (!) 146/56 (!) 154/82  Pulse: 71 73 72 78  Resp: 17 (!) 22 19 17   Temp: 99.7 F (37.6 C)  98.8 F (37.1 C) 98.1 F (36.7 C)  TempSrc: Axillary  Axillary Oral  SpO2: 94%  93% 93%  Weight:      Height:        Intake/Output Summary (Last 24 hours) at 05/30/2019 0858 Last data filed at 05/30/2019 0505 Gross per 24 hour  Intake 1007.75 ml  Output 1500 ml  Net -492.25 ml   Filed Weights   05/29/19 0400  Weight: 111.7 kg    Examination:  General: No acute distress. Cardiovascular: RRR Lungs: Clear to auscultation bilaterally Abdomen: Soft, nontender, nondistended  Neurological: lethargic, today moving upper extremities  spontaneously, follows commands with upper extremities. Skin: Warm and dry. No rashes or lesions. Extremities: No clubbing or cyanosis. L heel decubitus in dressing.  LLE surgical wound appears clean without draining or redness.   Data Reviewed: I have personally reviewed following labs and imaging studies  CBC: Recent Labs  Lab 05/27/19 1347 05/28/19 0605 05/29/19 1245 05/30/19 0239  WBC 7.1 4.9 7.0 6.1  NEUTROABS 5.6 3.8 6.1 5.3  HGB 10.5* 9.3* 9.6* 9.2*  HCT 33.8* 30.6* 31.0* 29.9*  MCV 77.2* 77.5* 77.3* 77.7*  PLT 281 266 264 [id number]   Basic Metabolic Panel: Recent Labs  Lab 05/27/19 1347 05/28/19 0605 05/29/19 1245 05/30/19 0239  NA 142 143 148* 150*  K 3.8 4.1 3.4* 3.4*  CL 104 108 114* 116*  CO2 23 20* 23 20*  GLUCOSE 160* 205* 110* 220*  BUN 93* 93* 84* 87*  CREATININE 2.81* 2.49* 1.54* 1.50*  CALCIUM 8.7* 8.2* 8.5* 8.6*  MG  --   --  2.3  --   PHOS  --   --  3.2  --    GFR: Estimated Creatinine Clearance: 57.9 mL/min (Victor Wise) (by C-G formula based on SCr of 1.5 mg/dL (H)). Liver Function Tests: Recent Labs  Lab 05/27/19 1347 05/28/19 0605 05/29/19 1245 05/30/19 0239  AST 55* 36 30 26  ALT 16 13 12 11   ALKPHOS 97 77 73 69  BILITOT 0.8 1.2 1.2 1.1  PROT 7.1 6.7 6.5 6.8  ALBUMIN 2.7* 2.3* 2.4* 2.3*   Recent Labs  Lab 05/27/19 1347  LIPASE 22   Recent Labs  Lab 05/29/19 2155  AMMONIA 25   Coagulation Profile: Recent Labs  Lab 05/27/19 1347  INR 1.1   Cardiac Enzymes: No results for input(s): CKTOTAL, CKMB, CKMBINDEX, TROPONINI in the last 168 hours. BNP (last 3 results) No results for input(s): PROBNP in the last 8760 hours. HbA1C: No results for input(s): HGBA1C in the last 72 hours. CBG: Recent Labs  Lab 05/29/19 2028 05/30/19 0001 05/30/19 0408 05/30/19 0658 05/30/19 0726  GLUCAP 142* 181* 233* 245* 247*   Lipid Profile: Recent Labs    05/27/19 1347  TRIG 187*   Thyroid Function Tests: Recent Labs    05/29/19 1245  TSH  0.110*   Anemia Panel: Recent Labs    05/29/19 1245 05/30/19 0239  VITAMINB12 798  --   FOLATE 16.1  --   FERRITIN 55  52 75   Sepsis  Labs: Recent Labs  Lab 05/27/19 1347 05/29/19 1245  PROCALCITON 0.14 0.35  LATICACIDVEN 1.9  --     Recent Results (from the past 240 hour(s))  Blood Culture (routine x 2)     Status: Abnormal   Collection Time: 05/27/19  1:47 PM   Specimen: BLOOD  Result Value Ref Range Status   Specimen Description   Final    BLOOD LEFT ANTECUBITAL Performed at [facility], [address] [place], [place] [address]    Special Requests   Final    BOTTLES DRAWN AEROBIC AND ANAEROBIC Blood Culture results may not be optimal due to an excessive volume of blood received in culture bottles Performed at [facility], [address] [place], [place] [address]    Culture  Setup Time   Final    AEROBIC BOTTLE ONLY GRAM POSITIVE COCCI CRITICAL RESULT CALLED TO, READ BACK BY AND VERIFIED WITH: KRISTIN MERRILL AT [place] ON 05/28/19 JJB Performed at [facility], [address] [place], [place] [address]    Culture (Amarius Toto)  Final    STAPHYLOCOCCUS SPECIES (COAGULASE NEGATIVE) THE SIGNIFICANCE OF ISOLATING THIS ORGANISM FROM Victor Wise SINGLE SET OF BLOOD CULTURES WHEN MULTIPLE SETS ARE DRAWN IS UNCERTAIN. PLEASE NOTIFY THE MICROBIOLOGY DEPARTMENT WITHIN ONE WEEK IF SPECIATION AND SENSITIVITIES ARE REQUIRED. Performed at [facility], [place] [address] [place], [place] [address]    Report Status 05/29/2019 FINAL  Final  Urine culture     Status: Abnormal (Preliminary result)   Collection Time: 05/27/19  1:47 PM   Specimen: In/Out Cath Urine  Result Value Ref Range Status   Specimen Description   Final    IN/OUT CATH URINE Performed at [facility], [address] [place], [place] [address]    Special Requests   Final    NONE Performed at [facility], [address], [place], [place] [address]    Culture (Salwa Bai)   Final    >=100,000 COLONIES/mL ENTEROBACTER SPECIES SUSCEPTIBILITIES TO FOLLOW Performed at [facility], [place] [address] [place], [place] [address]    Report Status PENDING  Incomplete  CULTURE, BLOOD (ROUTINE X 2) w Reflex to ID Panel     Status: None (Preliminary result)   Collection Time: 05/28/19  5:42 PM   Specimen: BLOOD  Result Value Ref Range Status   Specimen Description BLOOD Blood Culture adequate volume  Final   Special Requests   Final    BOTTLES DRAWN AEROBIC AND ANAEROBIC LEFT ANTECUBITAL   Culture   Final    NO GROWTH 2 DAYS Performed at [facility], [address] [place], [place] [address]    Report Status PENDING  Incomplete  CULTURE, BLOOD (ROUTINE X 2) w Reflex to ID Panel     Status: None (Preliminary result)   Collection Time: 05/28/19  5:42 PM   Specimen: BLOOD  Result Value Ref Range Status   Specimen Description BLOOD Blood Culture adequate volume  Final   Special Requests   Final    BOTTLES DRAWN AEROBIC AND ANAEROBIC BLOOD LEFT HAND   Culture   Final    NO GROWTH 2 DAYS Performed at [facility], [address], [place], [place] [address]    Report Status PENDING  Incomplete  MRSA PCR Screening     Status: Abnormal   Collection Time: 05/29/19  6:28 AM   Specimen: Nasal Mucosa; Nasopharyngeal  Result Value Ref Range Status   MRSA by PCR POSITIVE (  Isola Mehlman) NEGATIVE Final    Comment:        The GeneXpert MRSA Assay (FDA approved for NASAL specimens only), is one component of Jakori Burkett comprehensive MRSA colonization surveillance program. It is not intended to diagnose MRSA infection nor to guide or monitor treatment for MRSA infections. RESULT CALLED TO, READ BACK BY AND VERIFIED WITH: RN Geoffery Spruce AT 2018 05/29/19 CRUICKSHANK Jett Kulzer Performed at [facility], [place] [address], [place], [place] [address]          Radiology Studies: DG Ankle 2 Views Left  Result Date: 05/29/2019 CLINICAL DATA:  69 year old  male with prior distal left fibular fixation hardware removal. Patient presenting with fever. EXAM: LEFT ANKLE - 2 VIEW COMPARISON:  Radiograph dated 04/12/2019. FINDINGS: There is no acute fracture or dislocation. Irregular appearance of the distal fibula similar to prior radiograph. The bones are osteopenic. There is slight cortical irregularity of the posterior talar dome which may be artifactual and related to projection of the lateral malleolus. Irregularity of the tip of the medial malleolus similar to prior radiograph, likely related to an old fracture. The bones are osteopenic. The ankle mortise is intact. The soft tissues are grossly unremarkable. Karli Wickizer cast is noted. IMPRESSION: 1. No acute fracture or dislocation. No significant interval change since the prior radiograph. 2. Osteopenia. Electronically Signed   By: Anner Crete M.D.   On: 05/29/2019 16:10   CT HEAD WO CONTRAST  Result Date: 05/29/2019 CLINICAL DATA:  Encephalopathy EXAM: CT HEAD WITHOUT CONTRAST TECHNIQUE: Contiguous axial images were obtained from the base of the skull through the vertex without intravenous contrast. COMPARISON:  CT head 02/05/2019 FINDINGS: Brain: Chronic infarct left corona radiata unchanged from prior study. Negative for acute infarct. Negative for hemorrhage or mass. Mild atrophy. Vascular: Negative for hyperdense vessel Skull: Negative Sinuses/Orbits: Prior sinus surgery with mild mucosal edema paranasal sinuses. Negative orbit Other: None IMPRESSION: No acute abnormality. Atrophy and chronic infarct in the left corona radiata. Electronically Signed   By: Franchot Gallo M.D.   On: 05/29/2019 18:18   VAS [place] LOWER EXTREMITY VENOUS (DVT)  Result Date: 05/29/2019  Lower Venous Study Indications: Elevated Ddimer.  Risk Factors: COVID 19 positive. Limitations: Body habitus, poor ultrasound/tissue interface and patient immobility. Comparison Study: No prior studies. Performing Technologist: Oliver Hum RVT   Examination Guidelines: Overton Boggus complete evaluation includes B-mode imaging, spectral Doppler, color Doppler, and power Doppler as needed of all accessible portions of each vessel. Bilateral testing is considered an integral part of Shaheen Mende complete examination. Limited examinations for reoccurring indications may be performed as noted.  +---------+---------------+---------+-----------+----------+--------------+ RIGHT    CompressibilityPhasicitySpontaneityPropertiesThrombus Aging +---------+---------------+---------+-----------+----------+--------------+ CFV      Full           Yes      Yes                                 +---------+---------------+---------+-----------+----------+--------------+ SFJ      Full                                                        +---------+---------------+---------+-----------+----------+--------------+ FV Prox  Full                                                        +---------+---------------+---------+-----------+----------+--------------+  FV Mid   Full                                                        +---------+---------------+---------+-----------+----------+--------------+ FV DistalFull                                                        +---------+---------------+---------+-----------+----------+--------------+ PFV      Full                                                        +---------+---------------+---------+-----------+----------+--------------+ POP      Full           Yes      Yes                                 +---------+---------------+---------+-----------+----------+--------------+ PTV      Full                                                        +---------+---------------+---------+-----------+----------+--------------+ PERO     Full                                                        +---------+---------------+---------+-----------+----------+--------------+    +----+---------------+---------+-----------+----------+--------------+ LEFTCompressibilityPhasicitySpontaneityPropertiesThrombus Aging +----+---------------+---------+-----------+----------+--------------+ CFV Full           Yes      Yes                                 +----+---------------+---------+-----------+----------+--------------+     Summary: Right: There is no evidence of deep vein thrombosis in the lower extremity. No cystic structure found in the popliteal fossa. Left: No evidence of common femoral vein obstruction.  *See table(s) above for measurements and observations. Electronically signed by Harold Barban MD on 05/29/2019 at 8:32:40 PM.    Final         Scheduled Meds: . amLODipine  10 mg Oral Daily  . vitamin C  500 mg Oral Daily  . aspirin EC  81 mg Oral Daily  . Chlorhexidine Gluconate Cloth  6 each Topical Daily  . clopidogrel  75 mg Oral Daily  . docusate sodium  100 mg Oral Daily  . DULoxetine  30 mg Oral QPM  . enoxaparin (LOVENOX) injection  55 mg Subcutaneous Q24H  . insulin aspart  0-9 Units Subcutaneous Q4H  . insulin glargine  15 Units Subcutaneous BID  . Ipratropium-Albuterol  1 puff Inhalation Q6H  . methylPREDNISolone (SOLU-MEDROL) injection  60 mg Intravenous Q12H  . oxcarbazepine  600 mg Oral Daily  . pantoprazole  40 mg Oral Daily  . pregabalin  200 mg Oral QHS  . pregabalin  400 mg Oral Daily  . sodium chloride flush  10-40 mL Intracatheter Q12H  . tamsulosin  0.4 mg Oral Daily  . zinc sulfate  220 mg Oral Daily   Continuous Infusions: . ceFEPime (MAXIPIME) IV 2 g (05/30/19 0505)  . dextrose 75 mL/hr at 05/30/19 0743  . remdesivir 100 mg in NS 100 mL Stopped (05/29/19 1611)  . vancomycin       LOS: 1 day    Time spent: over 30 min    Fayrene Helper, MD [facility] Pager AMION  If 7PM-7AM, please contact night-coverage [email or web address] Password [facility] 05/30/2019, 8:58 AM

## 2019-05-30 NOTE — Progress Notes (Signed)
Patient transferred to room 311.  Report given to [facility].

## 2019-05-30 NOTE — Progress Notes (Signed)
SLP Cancellation Note  Patient Details Name: REGGY DRYER MRN: [id number] DOB: 1950/10/31   Cancelled treatment:       Reason Eval/Treat Not Completed: Fatigue/lethargy limiting ability to participate. Given transition to comfort measures will sign off.    Caterra Ostroff, Katherene Ponto 05/30/2019, 3:10 PM

## 2019-05-30 NOTE — Consult Note (Signed)
WOC Nurse Consult Note: Patient receiving care in [place]; patient COVID +. Consult completed remotely after review of record and telephone discussion with his primary RN, Wonda Cheng at [phone].  Reason for Consult: ankle surgical wound, sacral wounds, left heel wound Jenna explained they will be transitioning care of the patient to comfort care and that there are foam dressings over all the wounds.  Plan is to continue the foam dressings.  Should the decision be made for full press ahead rather than comfort care, then please reconsult the [place] team. Discussed plan of care with the bedside nurse.  Foster nurse will not follow at this time.  Please re-consult the [place] team if needed.  Val Riles, RN, MSN, CWOCN, CNS-BC, pager [phone]

## 2019-05-30 NOTE — Progress Notes (Signed)
Manufacturing engineer Documentation  Liaison received referral for pt to transfer to [facility] in [place] for EOL care. Pt's chart reviewed by [facility]. Pt is eligible for EOL care at [facility].  Writer spoke with pt's daughter, [name], to confirm interest. Discussed hospice philosophy and answered all questions. Explained that there are not currently any available beds but that once there is, both she and hospital staff will be notified.   Please do not hesitate to outreach with any questions in the interim and thank you for the referral.  Freddie Breech, RN [facility] Liaison [phone]

## 2019-05-30 NOTE — Plan of Care (Signed)
POC and pt's clinical condition discussed with daughter. Daughter verbalizes understanding. Pt drowsy and unable to remain alert for most education topics.

## 2019-05-30 NOTE — TOC Progression Note (Signed)
Transition of Care [facility]) - Progression Note    Patient Details  Name: Victor Wise MRN: [id number] Date of Birth: March 13, 1951  Transition of Care [facility]) CM/SW Contact  Shade Flood, LCSW Phone Number: 05/30/2019, 2:32 PM  Clinical Narrative:     Pt admitted from a SNF. MD has discussed pt's status with family and they are looking at comfort care/hospice residential treatment. Consult entered for [facility] to assist with hospice referral. Spoke with Delsa Sale at [facility] to refer. She will review clinical and follow up with family as appropriate.  TOC will follow to continue to assist with dc planning.   Expected Discharge Plan: [place] Barriers to Discharge: Continued Medical Work up  Expected Discharge Plan and Services Expected Discharge Plan: [place]                                               Social Determinants of Health (SDOH) Interventions    Readmission Risk Interventions No flowsheet data found.

## 2019-05-30 NOTE — Progress Notes (Signed)
In person family visit completed with patient's daughter, [name].  Plan of care discussed.  All questions welcomed and answered.

## 2019-05-31 LAB — GLUCOSE, CAPILLARY: Glucose-Capillary: 363 mg/dL — ABNORMAL HIGH (ref 70–99)

## 2019-05-31 NOTE — Progress Notes (Signed)
Audiological scientist in [place] does have a bed available for pt today. Pt's medical chart reviewed by [facility] hospice for eligibility and it was determined that pt is eligible for placement. All paperwork has been completed and pt is ready to be accepted into [facility] care. Please call transport for 12:30.   Please call [facility] at [phone] to give charge nurse report and send discharge summary with EMS.   Please dc any lines. May leave catheter in place if pt has one. Please send pt to [facility] with DNR paperwork.   Please call liaison at [phone] once pt has left so that we can be ready in PPE to accept pt at [facility].     Thank you,  Freddie Breech, RN Manufacturing engineer Documentation

## 2019-05-31 NOTE — TOC Transition Note (Signed)
Transition of Care [facility]) - CM/SW Discharge Note   Patient Details  Name: Victor Wise MRN: [id number] Date of Birth: May 05, 1951  [facility] CM/SW Contact:  Atilano Median, LCSW Phone Number: 05/31/2019, 9:54 AM   Clinical Narrative:    Discharged to [facility] in [place]. Patient's daughter [name] aware and agreeable to this plan. Number to call report [phone] given to unit RN Eual Fines. No other needs at this time. Case closed to this CSW.  Final next level of care: [place] Barriers to Discharge: Barriers Resolved   Patient Goals and CMS Choice        Discharge Placement              Patient chooses bed at: Other - please specify in the comment section below:([facility])) Patient to be transferred to facility by: Catharine Name of family member notified: [facility]                                     Social Determinants of Health (SDOH) Interventions     Readmission Risk Interventions No flowsheet data found.

## 2019-05-31 NOTE — Care Management Important Message (Signed)
Important Message  Patient Details  Name: Victor Wise MRN: [id number] Date of Birth: [birth year]   Medicare Important Message Given:  Yes - Important Message mailed due to current National Emergency  Verbal consent obtained due to current National Emergency  Relationship to patient: Child Contact Name: [name] Call Date: 05/31/19  Time: 1100 Phone: [phone] Outcome: Spoke with contact Important Message mailed to: Other (must enter comment)(Daughter declined copy as dad is going to hospice)    Delorse Lek 05/31/2019, 11:01 AM

## 2019-05-31 NOTE — Progress Notes (Signed)
   05/31/19 1000  Family/Significant Other Communication  Family/Significant Other Update Called;Updated  Called and updated daughter. All questions asked and all questions answered.

## 2019-05-31 NOTE — Discharge Summary (Addendum)
Physician Discharge Summary  Victor Wise TDS:[id number] DOB: [birth year] DOA: 05/29/2019  PCP: Lavera Guise, MD  Admit date: 05/29/2019 Discharge date: 05/31/2019  Time spent: 40 minutes  Recommendations for Outpatient Follow-up:  1. Comfort meds/orders per hospice  2. 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  Discharge Diagnoses:  Principal Problem:   Acute hypoxemic respiratory failure due to severe acute respiratory syndrome coronavirus 2 (SARS-CoV-2) disease (HCC) Active Problems:   Uncontrolled type 2 diabetes mellitus with hyperglycemia (HCC)   Essential hypertension   OSA on CPAP   Chronic diastolic CHF (congestive heart failure), NYHA class 2 (HCC)   Chronic, continuous use of opioids   Obesity, Class III, BMI 40-49.9 (morbid obesity) (HCC)   Pain syndrome, chronic   Urinary tract infection   Moderate episode of recurrent major depressive disorder ([place])   Discharge Condition: stable  Diet recommendation: as tolerated for comfort  Filed Weights   05/29/19 0400  Weight: 111.7 kg   History of present illness:  DavidWoodis a68 y.o.male,with CHF, BPH, chronic back pain, chronic narcotic use, depression, obesity, HTN, GERD, OSA, recurrent UTIs who presents as [name] transfer from [facility] for management of respiratory failure with hypoxia secondary to SARS-CoV-2 pneumonia. The patient resides in the nursing home, was noted to be hypoxic with sats in the 60s and transported to the emergency room. Supplemental oxygen was initiated to the ED. In the emergency room SARS-CoV-2 test was positive, CXR revealed bilateral lung infiltrates. Ferritin was 47, LDH 223, procalcitonin 0.14, lactic acid 1.9. He was given IV dexamethasone, IV cefepime, IV Flagyl,1Lof normal salineand IV remdesivir in the emergency room.He was transiently on BiPAP, he has been refusing to take his medications stating he wanted,palliative care was consulted, unfortunately they are  unable to reach the daughter ([name] [phone])BUL is POA.Of note patient was [facility] hospital[facility]2020undergoing treatment for infected left lower extremity hardware, intraoperative cultures grew MSSA and was discharged on Ancef which was due to be completed 05/26/2019.  He was admitted to [facility] for Han [place] COVID 19 infection with acute hypoxic respiratory failure.  Also noted to have enterobacter UTI.  He's been persistently altered since his arrival.  Prior to his admission, he'd been refusing therapy at the SNF.  GOC discussed with daughter and ultimately have decided on hospice/comfort measures.  Plan to discharge to hospice house in [place] today.  Hospital Course:  Goals of Care: had discussion with [name] due to his AMS.  He's been in and out of hospital/rehab since September of 2020.  He's had short term memory issues for Victor Wise while as well.  She's aware that recently in rehab he's been refusing therapy, medications, refusing to eat at times.  In previous notes, it was noted that he stated that he was tired and wanted to die.  His code status was changed to DNR after Breindel Collier discussion with [name] and due to persistent altered mental status with lethargy as well as his prior pattern of declining treatment and therapy, we've made the decision to transition him to comfort care/hospice.    Acute Hypoxic Respiratory Failure Secondary to COVID 19 Pneumonia:  Currently on RA Sp steroids, remdesivir, plasma (partial dose) Comfort measures as above   Sepsis likely 2/2 COVID 19 infection: fever with hypoxia and AMS. Urine cx with gram negative rods (enterobacter - carbapenemase resistant - needs contact precautions) Blood cx NGTD x3 (aerobic cx from 1/11 with coag negative staph, suspect this is contaminant, follow  repeat cx from 1/12) Follow LLE plain films (no acute fx or dislocation) Comfort measures as noted above  Acute Metabolic  Encephalopathy: likely 2/2 COVID 19 infection, possible contribution from chronic opiate use in setting of AKI. Hold home opiates Delirium precautions TSH (follow free T4), vitamin b12, folate Continue to treat above Follow head CT (with chronic infarct) Continues to wax and wane, he's lethargic (eyes mostly open today), will respond to some questions, with significant delay, but inconsistent, staring off, [name].  Of note, pt tried to drink something today with nursing, coughed this up.  Given plan for comfort measures, can continue feeds as needed for comfort.  Acute Kidney Injury: baseline creatinine <1, 2.81 on presentation.  Improving on 1/12.  Continue to monitor.  Follow UA.  Consider renal US if not continuing to improve.  T2DM: comfort measures  Hypertension: continue amlodipine Comfort measures  OSA on CPAP: pt nonadherent  HFpEF: follow volume status, doesn't appear overloaded  Chronic Pain  Chronic Opiate Use: hold narcotics, on lyrica and trileptal as well in addition to cymbalta - everything currently on hold as he's unable to take PO.  No hx of seizures per discussion with daughter. Holding meds due to AMS Pain meds as needed for comfort  Obesity Body mass index is 37.44 kg/m.  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  Apparently was refusing some medications. Check plain films as above Ankle was in splint, this was removed after discussion with ortho on admission to [facility] to evaluate wound Currently comfort measures, would move left lower extremity as Neco Kling unit, non weight bearing  Unstageable decubitus to L heel Wound care  LLE Surgical Wound: Does not appear infected  Procedures: Summary: Right: There is no evidence of deep vein thrombosis in the lower extremity. No cystic structure found in the popliteal  fossa. Left: No evidence of common femoral vein obstruction.  Consultations:  none  Discharge Exam: Vitals:   05/30/19 1915 05/31/19 0800  BP: 133/61 119/82  Pulse: 71 92  Resp: 18 20  Temp: 98.7 F (37.1 C) 98.7 F (37.1 C)  SpO2: 91% 90%   Kaitlynn Tramontana&Ox1, "yes sir" when I address him, but unable to understand much else he says to me At daughters request, I spoke to Mr. Stegall about our plan for hospice.  I told him that we planned to keep him comfortable because we're concerned that he's dying, so we're going to get him to hospice.  (he did not seem to understand). Discussion with daughter regarding discharge, noted today eyes open Cristhian Vanhook bit more consistently, still lethargic, confused, will answer some questions, but inconsistently and with significant delay.  Discussed plan again for hospice.  She notes she's at peace with decision, acknowledges no one knows when someones time will be.  General: No acute distress. Cardiovascular: Heart sounds show Jess Toney regular rate, and rhythm.  Lungs: Clear to auscultation bilaterally Abdomen: Soft, nontender, nondistended  Neurological: Alert and oriented 1.  Lethargic, staring off for periods at Stonewall Doss time, will attend to voice, says yes sir, inconsistently when I address him.   Moves upper extremities spontaneously. Cranial nerves II through XII grossly intact. Skin: Warm and dry. No rashes or lesions. Extremities: No clubbing or cyanosis. No edema.   Discharge Instructions   Discharge Instructions    Discharge instructions   Complete by: As  directed    We're planning for discharge to hospice house for comfort measures.   Increase activity slowly   Complete by: As directed      Allergies as of 05/31/2019      Reactions   Amoxicillin Other (See Comments)   Has never taken amoxicillin -ENT told him not to take (? Had skin test) Has patient had Seville Brick PCN reaction causing immediate rash, facial/tongue/throat swelling, SOB or lightheadedness with hypotension:  No Has patient had Hassell Patras PCN reaction causing severe rash involving mucus membranes or skin necrosis: No Has patient had Tamee Battin PCN reaction that required hospitalization No Has patient had Lenville Hibberd PCN reaction occurring within the last 10 years: No If above answers are "NO", then may proceed w/ Cephalo   Other Anaphylaxis, Itching, Other (See Comments)   Pt states that he is allergic to Endopa.  Reaction:  Anaphylaxis  Pt states that he is allergic to Metabisulfites Reaction:  Itching    Penicillins Other (See Comments)   Arm turned "blue" at injection site (no treatment needed) Has patient had Destenie Ingber PCN reaction causing immediate rash, facial/tongue/throat swelling, SOB or lightheadedness with hypotension: No Has patient had Latoyna Hird PCN reaction causing severe rash involving mucus membranes or skin necrosis: No Has patient had Areeba Sulser PCN reaction that required hospitalization No Has patient had Adrielle Polakowski PCN reaction occurring within the last 10 years: No If all of the above answers are "NO", then may proceed with Cephalosporin use.   Hydralazine Other (See Comments)   Reaction:  Cramping of extremities Pt reports tolerating low dose 12.'5mg'$  but no higher.   Cephalexin Itching   Has tolerated IV cephalosporins   Clindamycin Itching   Crestor [rosuvastatin Calcium] Other (See Comments)   Reaction:  Pt is unable to move arms/legs    Metoprolol Nausea And Vomiting   Red Dye Itching   Statins Other (See Comments)   Reaction:  Pt is unable to move arms/legs   Sulfa Antibiotics Itching   Other reaction(s): Unknown   Yellow Dyes (non-tartrazine) Itching   Aspirin Itching, Other (See Comments)   Pt states that he is able to use in lower doses.     Tape Rash   Please use "paper" tape only.      Medication List    STOP taking these medications   Align 4 MG Caps   amLODipine 10 MG tablet Commonly known as: NORVASC   aspirin EC 81 MG tablet   clopidogrel 75 MG tablet Commonly known as: PLAVIX   dexamethasone 10  MG/ML injection Commonly known as: DECADRON   docusate sodium 100 MG capsule Commonly known as: COLACE   DULoxetine 30 MG capsule Commonly known as: CYMBALTA   ezetimibe 10 MG tablet Commonly known as: ZETIA   gemfibrozil 600 MG tablet Commonly known as: LOPID   heparin 5000 UNIT/ML injection   insulin glargine 100 UNIT/ML injection Commonly known as: LANTUS   insulin lispro 100 UNIT/ML injection Commonly known as: HUMALOG   lactulose 10 GM/15ML solution Commonly known as: CHRONULAC   Linzess 290 MCG Caps capsule Generic drug: linaclotide   multivitamin with minerals Tabs tablet   Narcan 4 MG/0.1ML Liqd nasal spray kit Generic drug: naloxone   omega-3 acid ethyl esters 1 g capsule Commonly known as: LOVAZA   ondansetron 4 MG tablet Commonly known as: ZOFRAN   ondansetron 4 MG/2ML Soln injection Commonly known as: ZOFRAN   oxcarbazepine 600 MG tablet Commonly known as: TRILEPTAL   oxyCODONE 20 mg 12 hr tablet  Commonly known as: OXYCONTIN   oxyCODONE 5 MG immediate release tablet Commonly known as: Oxy IR/ROXICODONE   pantoprazole 40 MG tablet Commonly known as: PROTONIX   polyethylene glycol 17 g packet Commonly known as: MIRALAX / GLYCOLAX   pregabalin 200 MG capsule Commonly known as: LYRICA   tamsulosin 0.4 MG Caps capsule Commonly known as: FLOMAX   vitamin C 250 MG tablet Commonly known as: ASCORBIC ACID   zinc sulfate 220 (50 Zn) MG capsule     TAKE these medications   acetaminophen 325 MG tablet Commonly known as: TYLENOL Take 2 tablets (650 mg total) by mouth every 6 (six) hours as needed for mild pain or headache (fever >/= 101).      Allergies  Allergen Reactions  . Amoxicillin Other (See Comments)    Has never taken amoxicillin -ENT told him not to take (? Had skin test) Has patient had Kaleem Sartwell PCN reaction causing immediate rash, facial/tongue/throat swelling, SOB or lightheadedness with hypotension: No Has patient had Jonael Paradiso PCN  reaction causing severe rash involving mucus membranes or skin necrosis: No Has patient had Arnol Mcgibbon PCN reaction that required hospitalization No Has patient had Tayvon Culley PCN reaction occurring within the last 10 years: No If above answers are "NO", then may proceed w/ Cephalo  . Other Anaphylaxis, Itching and Other (See Comments)    Pt states that he is allergic to Endopa.  Reaction:  Anaphylaxis  Pt states that he is allergic to Metabisulfites Reaction:  Itching   . Penicillins Other (See Comments)    Arm turned "blue" at injection site (no treatment needed) Has patient had Haly Feher PCN reaction causing immediate rash, facial/tongue/throat swelling, SOB or lightheadedness with hypotension: No Has patient had Whitney Hillegass PCN reaction causing severe rash involving mucus membranes or skin necrosis: No Has patient had Redell Nazir PCN reaction that required hospitalization No Has patient had Ruqaya Strauss PCN reaction occurring within the last 10 years: No If all of the above answers are "NO", then may proceed with Cephalosporin use.  [name] Hydralazine Other (See Comments)    Reaction:  Cramping of extremities Pt reports tolerating low dose 12.9m but no higher.  . Cephalexin Itching    Has tolerated IV cephalosporins  . Clindamycin Itching  . Crestor [Rosuvastatin Calcium] Other (See Comments)    Reaction:  Pt is unable to move arms/legs   . Metoprolol Nausea And Vomiting  . Red Dye Itching  . Statins Other (See Comments)    Reaction:  Pt is unable to move arms/legs  . Sulfa Antibiotics Itching    Other reaction(s): Unknown  . Yellow Dyes (Non-Tartrazine) Itching  . Aspirin Itching and Other (See Comments)    Pt states that he is able to use in lower doses.    . Tape Rash    Please use "paper" tape only.       The results of significant diagnostics from this hospitalization (including imaging, microbiology, ancillary and laboratory) are listed below for reference.    Significant Diagnostic Studies: DG Ankle 2 Views Left  Result  Date: 05/29/2019 CLINICAL DATA:  69year old male with prior distal left fibular fixation hardware removal. Patient presenting with fever. EXAM: LEFT ANKLE - 2 VIEW COMPARISON:  Radiograph dated 04/12/2019. FINDINGS: There is no acute fracture or dislocation. Irregular appearance of the distal fibula similar to prior radiograph. The bones are osteopenic. There is slight cortical irregularity of the posterior talar dome which may be artifactual and related to projection of the lateral malleolus. Irregularity of the  tip of the medial malleolus similar to prior radiograph, likely related to an old fracture. The bones are osteopenic. The ankle mortise is intact. The soft tissues are grossly unremarkable. Rendy Lazard cast is noted. IMPRESSION: 1. No acute fracture or dislocation. No significant interval change since the prior radiograph. 2. Osteopenia. Electronically Signed   By: Anner Crete M.D.   On: 05/29/2019 16:10   CT HEAD WO CONTRAST  Result Date: 05/29/2019 CLINICAL DATA:  Encephalopathy EXAM: CT HEAD WITHOUT CONTRAST TECHNIQUE: Contiguous axial images were obtained from the base of the skull through the vertex without intravenous contrast. COMPARISON:  CT head 02/05/2019 FINDINGS: Brain: Chronic infarct left corona radiata unchanged from prior study. Negative for acute infarct. Negative for hemorrhage or mass. Mild atrophy. Vascular: Negative for hyperdense vessel Skull: Negative Sinuses/Orbits: Prior sinus surgery with mild mucosal edema paranasal sinuses. Negative orbit Other: None IMPRESSION: No acute abnormality. Atrophy and chronic infarct in the left corona radiata. Electronically Signed   By: Franchot Gallo M.D.   On: 05/29/2019 18:18   DG Chest Port 1 View  Result Date: 05/27/2019 CLINICAL DATA:  Shortness of breath. EXAM: PORTABLE CHEST 1 VIEW COMPARISON:  February 18, 2016. FINDINGS: Stable cardiomediastinal silhouette. No pneumothorax is noted. Right-sided PICC line is noted with tip in expected  position of the SVC. No pneumothorax is noted. Central pulmonary vascular congestion is noted. Mild ill-defined bilateral lung opacities are noted which may represent pneumonia. Small left pleural effusion is noted. Bony thorax is unremarkable. IMPRESSION: Right-sided PICC line is noted with tip in expected position of the SVC. Central pulmonary vascular congestion is noted. Mild ill-defined bilateral lung opacities are noted concerning for pneumonia. Small left pleural effusion is noted. Electronically Signed   By: Marijo Conception M.D.   On: 05/27/2019 14:06   VAS [place] LOWER EXTREMITY VENOUS (DVT)  Result Date: 05/29/2019  Lower Venous Study Indications: Elevated Ddimer.  Risk Factors: COVID 19 positive. Limitations: Body habitus, poor ultrasound/tissue interface and patient immobility. Comparison Study: No prior studies. Performing Technologist: Oliver Hum RVT  Examination Guidelines: Neftali Abair complete evaluation includes B-mode imaging, spectral Doppler, color Doppler, and power Doppler as needed of all accessible portions of each vessel. Bilateral testing is considered an integral part of Gil Ingwersen complete examination. Limited examinations for reoccurring indications may be performed as noted.  +---------+---------------+---------+-----------+----------+--------------+ RIGHT    CompressibilityPhasicitySpontaneityPropertiesThrombus Aging +---------+---------------+---------+-----------+----------+--------------+ CFV      Full           Yes      Yes                                 +---------+---------------+---------+-----------+----------+--------------+ SFJ      Full                                                        +---------+---------------+---------+-----------+----------+--------------+ FV Prox  Full                                                        +---------+---------------+---------+-----------+----------+--------------+ FV Mid   Full                                                         +---------+---------------+---------+-----------+----------+--------------+  FV DistalFull                                                        +---------+---------------+---------+-----------+----------+--------------+ PFV      Full                                                        +---------+---------------+---------+-----------+----------+--------------+ POP      Full           Yes      Yes                                 +---------+---------------+---------+-----------+----------+--------------+ PTV      Full                                                        +---------+---------------+---------+-----------+----------+--------------+ PERO     Full                                                        +---------+---------------+---------+-----------+----------+--------------+   +----+---------------+---------+-----------+----------+--------------+ LEFTCompressibilityPhasicitySpontaneityPropertiesThrombus Aging +----+---------------+---------+-----------+----------+--------------+ CFV Full           Yes      Yes                                 +----+---------------+---------+-----------+----------+--------------+     Summary: Right: There is no evidence of deep vein thrombosis in the lower extremity. No cystic structure found in the popliteal fossa. Left: No evidence of common femoral vein obstruction.  *See table(s) above for measurements and observations. Electronically signed by Harold Barban MD on 05/29/2019 at 8:32:40 PM.    Final     Microbiology: Recent Results (from the past 240 hour(s))  Blood Culture (routine x 2)     Status: Abnormal   Collection Time: 05/27/19  1:47 PM   Specimen: BLOOD  Result Value Ref Range Status   Specimen Description   Final    BLOOD LEFT ANTECUBITAL Performed at [facility], [address] [place], [place] [address]    Special Requests   Final    BOTTLES DRAWN AEROBIC AND  ANAEROBIC Blood Culture results may not be optimal due to an excessive volume of blood received in culture bottles Performed at [facility], [address] [place], [place] [address]    Culture  Setup Time   Final    AEROBIC BOTTLE ONLY GRAM POSITIVE COCCI CRITICAL RESULT CALLED TO, READ BACK BY AND VERIFIED WITH: KRISTIN MERRILL AT 0915 ON 05/28/19 JJB Performed at [facility], [address] [place], [place] [address]    Culture (Lori-Ann Lindfors)  Final    STAPHYLOCOCCUS SPECIES (COAGULASE NEGATIVE) THE SIGNIFICANCE OF ISOLATING THIS ORGANISM  FROM Unice Vantassel SINGLE SET OF BLOOD CULTURES WHEN MULTIPLE SETS ARE DRAWN IS UNCERTAIN. PLEASE NOTIFY THE MICROBIOLOGY DEPARTMENT WITHIN ONE WEEK IF SPECIATION AND SENSITIVITIES ARE REQUIRED. Performed at [facility], [place] [address] [place], [place] [address]    Report Status 05/29/2019 FINAL  Final  Urine culture     Status: Abnormal   Collection Time: 05/27/19  1:47 PM   Specimen: In/Out Cath Urine  Result Value Ref Range Status   Specimen Description   Final    IN/OUT CATH URINE Performed at [facility], [address] [place], [place] [address]    Special Requests   Final    NONE Performed at [facility], [address] [place], [place] [address]    Culture (Kavi Almquist)  Final    >=100,000 COLONIES/mL ENTEROBACTER CLOACAE CONFIRMED CARBAPENEMASE RESISTANT ENTEROBACTERIACAE RESULT CALLED TO, READ BACK BY AND VERIFIED WITH: RN JENNA BOYER [id number] FCP MULTI-DRUG RESISTANT ORGANISM    Report Status 05/30/2019 FINAL  Final   Organism ID, Bacteria ENTEROBACTER CLOACAE (Duvid Smalls)  Final      Susceptibility   Enterobacter cloacae - MIC*    CEFAZOLIN >=64 RESISTANT Resistant     CIPROFLOXACIN >=4 RESISTANT Resistant     GENTAMICIN 8 INTERMEDIATE Intermediate     IMIPENEM >=16 RESISTANT Resistant     NITROFURANTOIN 64 INTERMEDIATE Intermediate     TRIMETH/SULFA <=20 SENSITIVE Sensitive     PIP/TAZO >=128 RESISTANT  Resistant     * >=100,000 COLONIES/mL ENTEROBACTER CLOACAE  Carbapenem Resistance Panel     Status: Abnormal   Collection Time: 05/27/19  1:47 PM  Result Value Ref Range Status   Carba Resistance IMP Gene NOT DETECTED NOT DETECTED Final   Carba Resistance VIM Gene NOT DETECTED NOT DETECTED Final   Carba Resistance NDM Gene NOT DETECTED NOT DETECTED Final   Carba Resistance KPC Gene DETECTED ([name] Feng) NOT DETECTED Final   Carba Resistance OXA48 Gene NOT DETECTED NOT DETECTED Final    Comment: (NOTE) Cepheid Carba-R is an FDA-cleared nucleic acid amplification test  (NAAT)for the detection and differentiation of genes encoding the  most prevalent carbapenemases in bacterial isolate samples. Carbapenemase gene identification and implementation of comprehensive  infection control measures are recommended by the CDC to prevent the  spread of the resistant organisms. Performed at [facility], [place] [address], [place], [place] [address]   CULTURE, BLOOD (ROUTINE X 2) w Reflex to ID Panel     Status: None (Preliminary result)   Collection Time: 05/28/19  5:42 PM   Specimen: BLOOD  Result Value Ref Range Status   Specimen Description BLOOD Blood Culture adequate volume  Final   Special Requests   Final    BOTTLES DRAWN AEROBIC AND ANAEROBIC LEFT ANTECUBITAL   Culture   Final    NO GROWTH 3 DAYS Performed at [facility], [address] [place], [place] [address]    Report Status PENDING  Incomplete  CULTURE, BLOOD (ROUTINE X 2) w Reflex to ID Panel     Status: None (Preliminary result)   Collection Time: 05/28/19  5:42 PM   Specimen: BLOOD  Result Value Ref Range Status   Specimen Description BLOOD Blood Culture adequate volume  Final   Special Requests   Final    BOTTLES DRAWN AEROBIC AND ANAEROBIC BLOOD LEFT HAND   Culture   Final    NO GROWTH 3 DAYS Performed at [facility], [address], [place], [place] [address]  Report Status PENDING  Incomplete   MRSA PCR Screening     Status: Abnormal   Collection Time: 05/29/19  6:28 AM   Specimen: Nasal Mucosa; Nasopharyngeal  Result Value Ref Range Status   MRSA by PCR POSITIVE (Rether Rison) NEGATIVE Final    Comment:        The GeneXpert MRSA Assay (FDA approved for NASAL specimens only), is one component of Uriel Dowding comprehensive MRSA colonization surveillance program. It is not intended to diagnose MRSA infection nor to guide or monitor treatment for MRSA infections. RESULT CALLED TO, READ BACK BY AND VERIFIED WITH: RN Geoffery [place] 2018 05/29/19 [place] Edan Juday Performed at [facility], [place] [address], [place], [place] [address]      Labs: Basic Metabolic Panel: Recent Labs  Lab 05/27/19 1347 05/28/19 0605 05/29/19 1245 05/30/19 0239  NA 142 143 148* 150*  K 3.8 4.1 3.4* 3.4*  CL 104 108 114* 116*  CO2 23 20* 23 20*  GLUCOSE 160* 205* 110* 220*  BUN 93* 93* 84* 87*  CREATININE 2.81* 2.49* 1.54* 1.50*  CALCIUM 8.7* 8.2* 8.5* 8.6*  MG  --   --  2.3  --   PHOS  --   --  3.2  --    Liver Function Tests: Recent Labs  Lab 05/27/19 1347 05/28/19 0605 05/29/19 1245 05/30/19 0239  AST 55* 36 30 26  ALT _0 ALKPHOS 97 77 73 69  BILITOT 0.8 1.2 1.2 1.1  PROT 7.1 6.7 6.5 6.8  ALBUMIN 2.7* 2.3* 2.4* 2.3*   Recent Labs  Lab 05/27/19 1347  LIPASE 22   Recent Labs  Lab 05/29/19 2155  AMMONIA 25   CBC: Recent Labs  Lab 05/27/19 1347 05/28/19 0605 05/29/19 1245 05/30/19 0239  WBC 7.1 4.9 7.0 6.1  NEUTROABS 5.6 3.8 6.1 5.3  HGB 10.5* 9.3* 9.6* 9.2*  HCT 33.8* 30.6* 31.0* 29.9*  MCV 77.2* 77.5* 77.3* 77.7*  PLT 281 266 264 255   Cardiac Enzymes: No results for input(s): CKTOTAL, CKMB, CKMBINDEX, TROPONINI in the last 168 hours. BNP: BNP (last 3 results) Recent Labs    05/29/19 1245  BNP 300.8*    ProBNP (last 3 results) No results for input(s): PROBNP in the last 8760 hours.  CBG: Recent Labs  Lab 05/30/19 0001 05/30/19 0408  05/30/19 0658 05/30/19 0726 05/30/19 1107  GLUCAP 181* 233* 245* 247* 308*       Signed:  Fayrene Helper MD.  [facility] 05/31/2019, 9:57 AM

## 2019-05-31 NOTE — Plan of Care (Signed)
Belongings packed up for ems. IV site left in pts arm and condom cath as well as requested by Rebeca Alert RN. NADN from pt. Daughter aware of transfer.

## 2019-06-02 LAB — CULTURE, BLOOD (ROUTINE X 2)
Culture: NO GROWTH
Culture: NO GROWTH
Specimen Description: ADEQUATE
Specimen Description: ADEQUATE

## 2019-06-09 DIAGNOSIS — R269 Unspecified abnormalities of gait and mobility: Secondary | ICD-10-CM | POA: Diagnosis not present

## 2019-06-17 DEATH — deceased

## 2019-07-25 ENCOUNTER — Ambulatory Visit: Payer: Self-pay | Admitting: Adult Health

## 2019-07-30 ENCOUNTER — Other Ambulatory Visit: Payer: Self-pay

## 2020-06-29 IMAGING — CT CT HEAD W/O CM
3 series · 15 of 47 positions shown, 18 images · non-contrast
Comparison: 10/06/2018

CLINICAL DATA: Altered mental status, confusion

EXAM:
CT HEAD WITHOUT CONTRAST
TECHNIQUE: Contiguous axial images were obtained from the base of the skull
through the vertex without intravenous contrast.

[Series 3: head wo · axial · 0.47mm/px · z∈[-144,-9]mm · 9 of 33 slices shown, 12 images]
[im 3/33  brain]
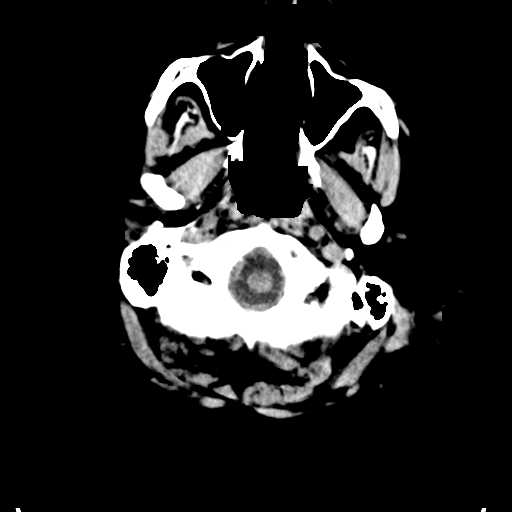
[im 3/33  bone]
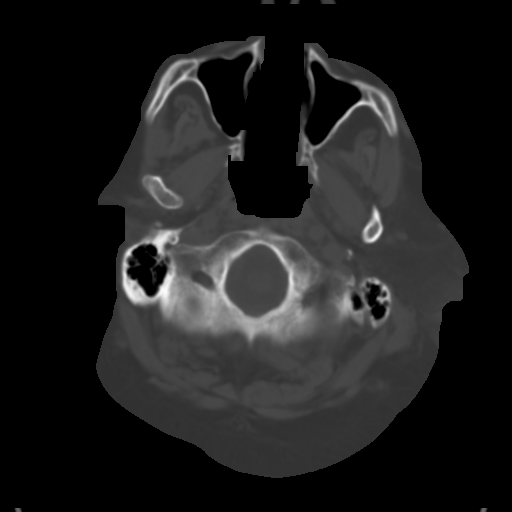
[im 6/33  brain]
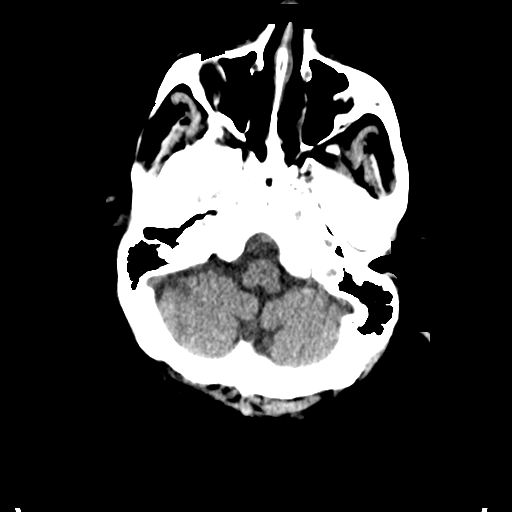
[im 9/33  brain]
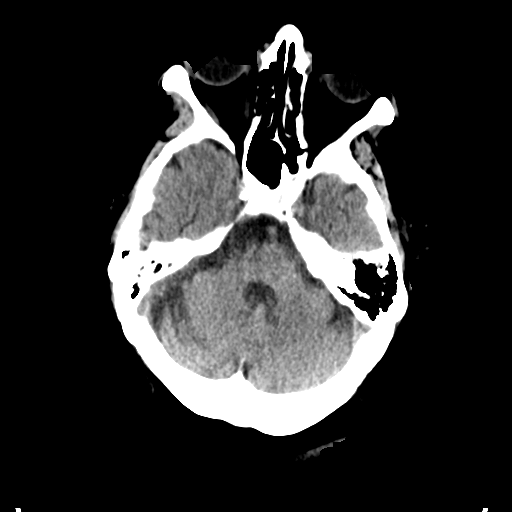
[im 13/33  brain]
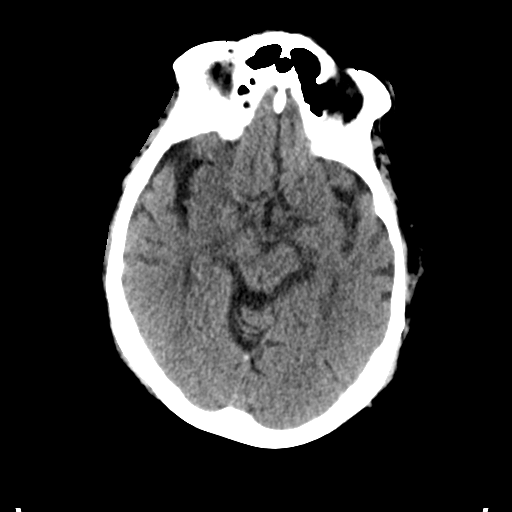
[im 17/33  brain]
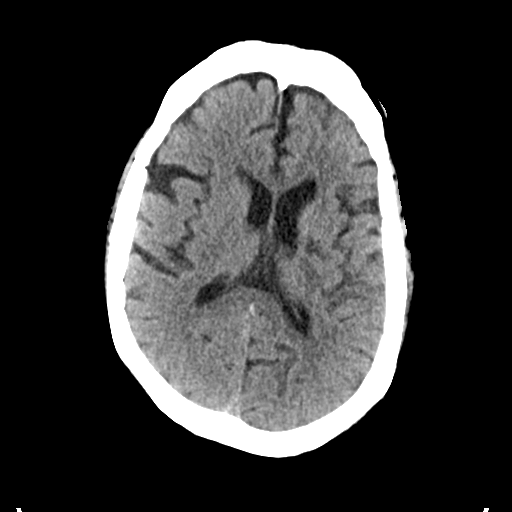
[im 17/33  bone]
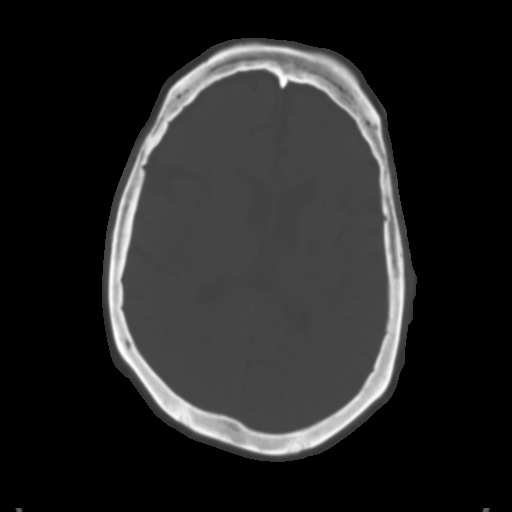
[im 20/33  brain]
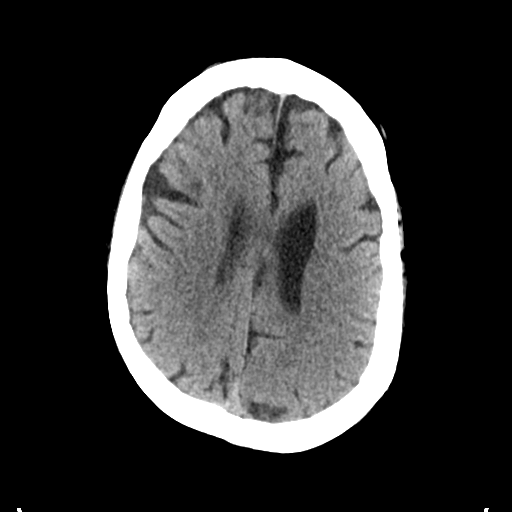
[im 24/33  brain]
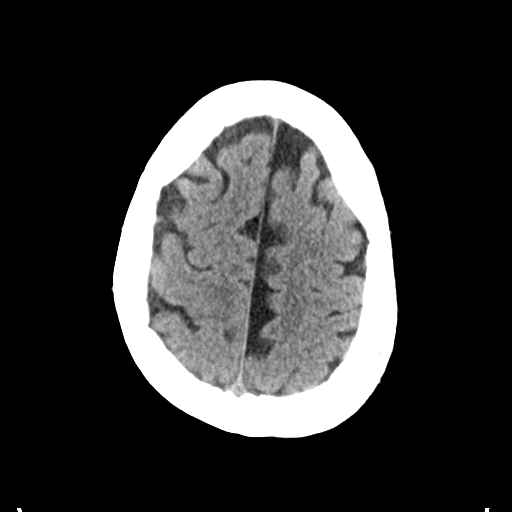
[im 27/33  brain]
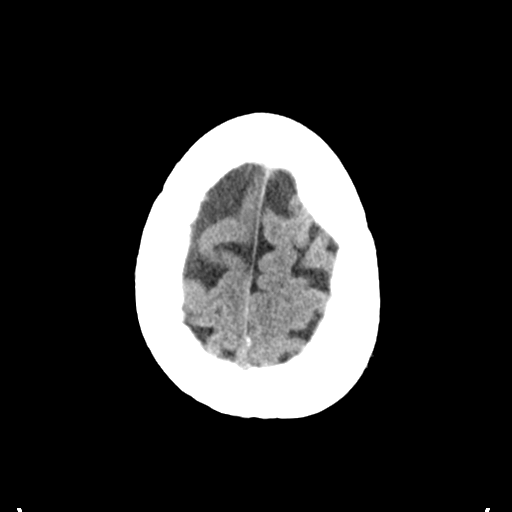
[im 30/33  brain]
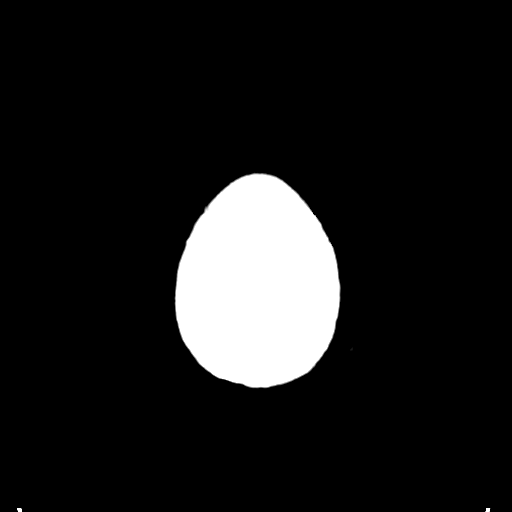
[im 30/33  bone]
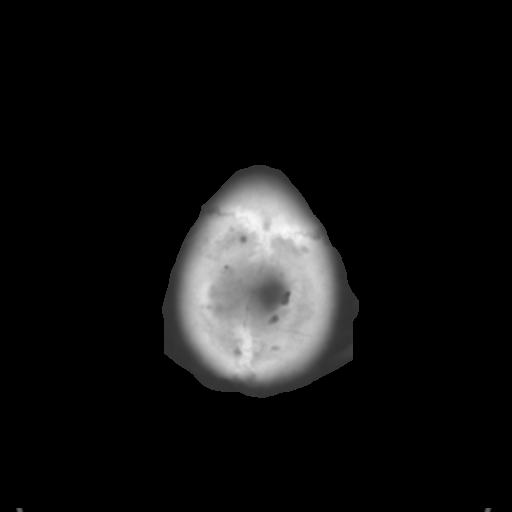

[Series 4: coronal soft tissue · coronal · 0.32mm/px · 3 of 64 slices shown]
[im 22/64  brain]
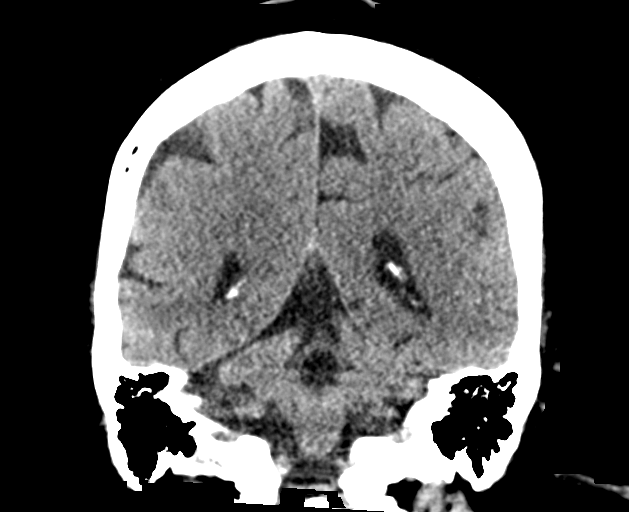
[im 29/64  brain]
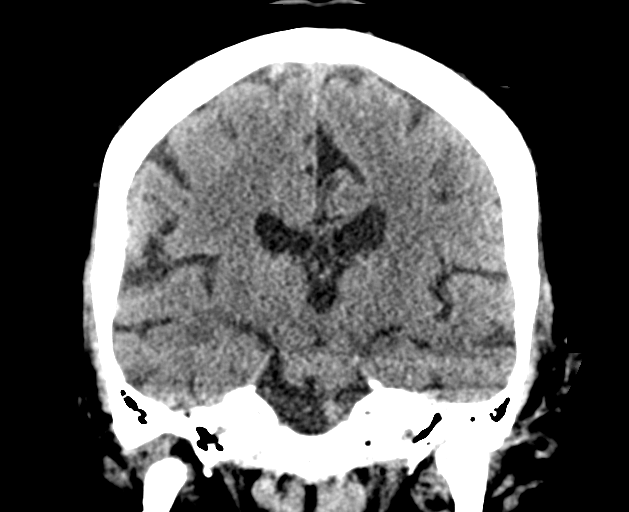
[im 36/64  brain]
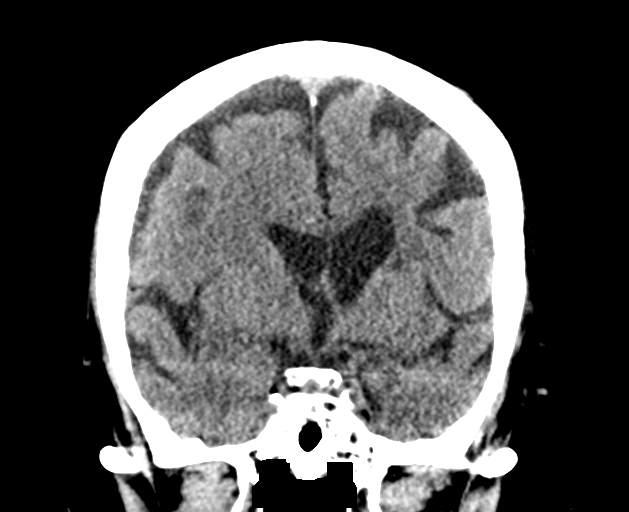

[Series 5: sagittal soft tissue · sagittal · 0.32mm/px · 3 of 54 slices shown]
[im 18/54  brain]
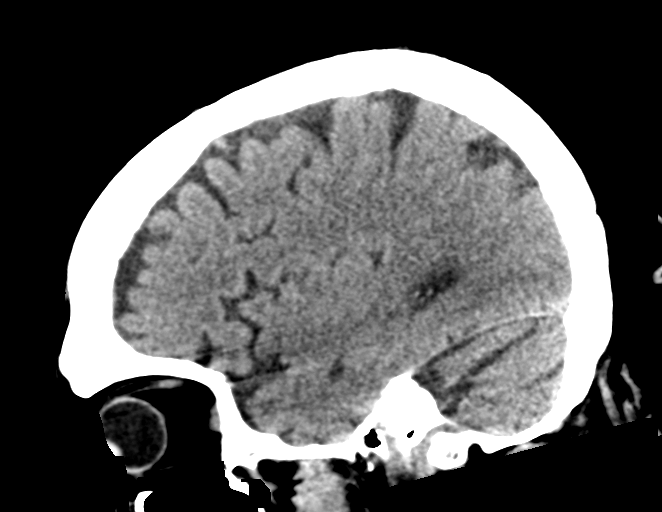
[im 27/54  brain]
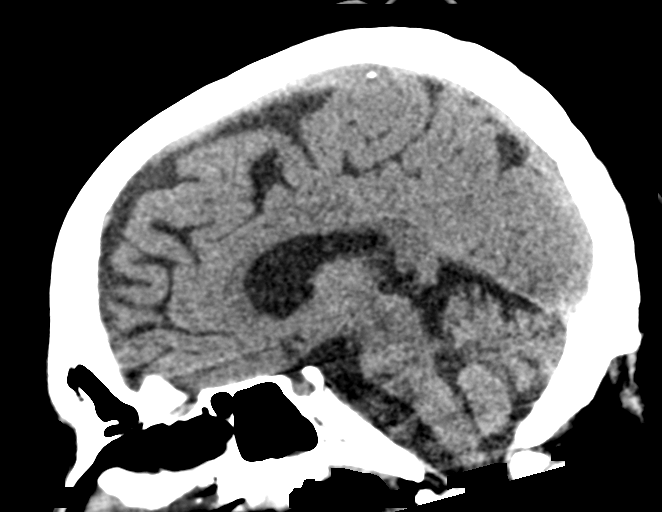
[im 36/54  brain]
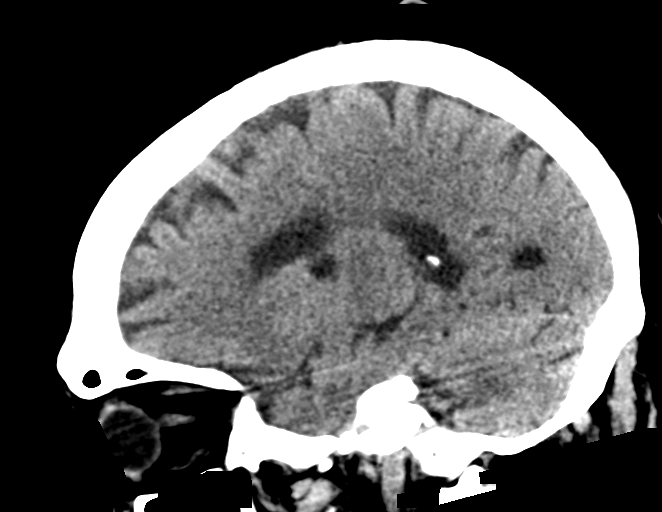

[15 of 47 positions shown; findings below may reference images not displayed]

FINDINGS: Brain: No evidence of acute infarction, hemorrhage, hydrocephalus,
extra-axial collection or mass lesion/mass effect. Encephalomalacia
from left basal ganglia lacunar infarct. Stable ventricular size and
configuration.

Vascular: Mild atherosclerotic calcifications involving the large
vessels of the skull base. No unexpected hyperdense vessel.

Skull: Normal. Negative for fracture or focal lesion.

Sinuses/Orbits: No acute finding.

Other: None.
IMPRESSION: No acute intracranial findings.  Stable exam from 10/06/2018.

## 2021-12-09 NOTE — Telephone Encounter (Signed)
error
